# Patient Record
Sex: Male | Born: 1945 | Race: White | Hispanic: No | Marital: Married | Smoking: Former smoker
Health system: Southern US, Community
[De-identification: ages and names within clinical notes are randomized; demographics above are authoritative.]

## PROBLEM LIST (undated history)

## (undated) DIAGNOSIS — H269 Unspecified cataract: Secondary | ICD-10-CM

## (undated) DIAGNOSIS — I10 Essential (primary) hypertension: Secondary | ICD-10-CM

## (undated) DIAGNOSIS — I4891 Unspecified atrial fibrillation: Secondary | ICD-10-CM

## (undated) DIAGNOSIS — Z720 Tobacco use: Secondary | ICD-10-CM

## (undated) DIAGNOSIS — I219 Acute myocardial infarction, unspecified: Secondary | ICD-10-CM

## (undated) DIAGNOSIS — T7840XA Allergy, unspecified, initial encounter: Secondary | ICD-10-CM

## (undated) HISTORY — DX: Essential (primary) hypertension: I10

## (undated) HISTORY — DX: Allergy, unspecified, initial encounter: T78.40XA

## (undated) HISTORY — DX: Unspecified cataract: H26.9

## (undated) HISTORY — DX: Tobacco use: Z72.0

## (undated) HISTORY — PX: HERNIA REPAIR: SHX51

## (undated) HISTORY — PX: OTHER SURGICAL HISTORY: SHX169

---
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
## 1997-10-18 ENCOUNTER — Ambulatory Visit (HOSPITAL_BASED_OUTPATIENT_CLINIC_OR_DEPARTMENT_OTHER): Admission: RE | Admit: 1997-10-18 | Discharge: 1997-10-18 | Payer: Self-pay | Admitting: Orthopaedic Surgery

## 2004-09-13 ENCOUNTER — Ambulatory Visit: Payer: Self-pay | Admitting: Internal Medicine

## 2004-10-18 ENCOUNTER — Ambulatory Visit: Payer: Self-pay | Admitting: Internal Medicine

## 2004-10-24 ENCOUNTER — Ambulatory Visit: Payer: Self-pay | Admitting: Internal Medicine

## 2005-01-15 ENCOUNTER — Ambulatory Visit: Payer: Self-pay | Admitting: Internal Medicine

## 2005-01-17 ENCOUNTER — Ambulatory Visit: Payer: Self-pay

## 2005-02-18 ENCOUNTER — Ambulatory Visit: Payer: Self-pay | Admitting: Internal Medicine

## 2005-02-26 ENCOUNTER — Ambulatory Visit: Payer: Self-pay | Admitting: Internal Medicine

## 2005-03-06 ENCOUNTER — Ambulatory Visit: Payer: Self-pay | Admitting: Internal Medicine

## 2005-08-27 ENCOUNTER — Ambulatory Visit: Payer: Self-pay | Admitting: Internal Medicine

## 2005-09-03 ENCOUNTER — Ambulatory Visit: Payer: Self-pay | Admitting: Internal Medicine

## 2005-09-27 ENCOUNTER — Ambulatory Visit: Payer: Self-pay | Admitting: Internal Medicine

## 2005-10-28 ENCOUNTER — Ambulatory Visit: Payer: Self-pay | Admitting: Internal Medicine

## 2006-04-02 ENCOUNTER — Ambulatory Visit: Payer: Self-pay | Admitting: Internal Medicine

## 2007-02-03 DIAGNOSIS — I10 Essential (primary) hypertension: Secondary | ICD-10-CM | POA: Insufficient documentation

## 2007-02-03 DIAGNOSIS — IMO0002 Reserved for concepts with insufficient information to code with codable children: Secondary | ICD-10-CM | POA: Insufficient documentation

## 2007-02-03 DIAGNOSIS — E1165 Type 2 diabetes mellitus with hyperglycemia: Secondary | ICD-10-CM

## 2007-02-03 DIAGNOSIS — I152 Hypertension secondary to endocrine disorders: Secondary | ICD-10-CM | POA: Insufficient documentation

## 2007-02-03 DIAGNOSIS — E1159 Type 2 diabetes mellitus with other circulatory complications: Secondary | ICD-10-CM

## 2007-02-03 DIAGNOSIS — E1139 Type 2 diabetes mellitus with other diabetic ophthalmic complication: Secondary | ICD-10-CM

## 2007-09-22 LAB — HM COLONOSCOPY

## 2008-07-11 ENCOUNTER — Ambulatory Visit: Payer: Self-pay | Admitting: Internal Medicine

## 2008-07-11 LAB — CONVERTED CEMR LAB
ALT: 13 units/L (ref 0–53)
AST: 13 units/L (ref 0–37)
Albumin: 4.2 g/dL (ref 3.5–5.2)
Alkaline Phosphatase: 59 units/L (ref 39–117)
BUN: 13 mg/dL (ref 6–23)
Basophils Absolute: 0 10*3/uL (ref 0.0–0.1)
Basophils Relative: 0.2 % (ref 0.0–3.0)
Bilirubin Urine: NEGATIVE
Bilirubin, Direct: 0.2 mg/dL (ref 0.0–0.3)
CO2: 29 meq/L (ref 19–32)
Calcium: 9 mg/dL (ref 8.4–10.5)
Chloride: 103 meq/L (ref 96–112)
Cholesterol: 215 mg/dL (ref 0–200)
Creatinine, Ser: 0.9 mg/dL (ref 0.4–1.5)
Direct LDL: 159.5 mg/dL
Eosinophils Absolute: 0.1 10*3/uL (ref 0.0–0.7)
Eosinophils Relative: 2.3 % (ref 0.0–5.0)
GFR calc Af Amer: 110 mL/min
GFR calc non Af Amer: 91 mL/min
Glucose, Bld: 325 mg/dL — ABNORMAL HIGH (ref 70–99)
HCT: 45.1 % (ref 39.0–52.0)
HDL: 32.6 mg/dL — ABNORMAL LOW (ref >39.0)
Hemoglobin, Urine: NEGATIVE
Hemoglobin: 15.3 g/dL (ref 13.0–17.0)
Ketones, ur: 15 mg/dL — AB
Leukocytes, UA: NEGATIVE
Lymphocytes Relative: 35.4 % (ref 12.0–46.0)
MCHC: 34 g/dL (ref 30.0–36.0)
MCV: 90.9 fL (ref 78.0–100.0)
Monocytes Absolute: 0.5 10*3/uL (ref 0.1–1.0)
Monocytes Relative: 8.6 % (ref 3.0–12.0)
Neutro Abs: 2.8 10*3/uL (ref 1.4–7.7)
Neutrophils Relative %: 53.5 % (ref 43.0–77.0)
Nitrite: NEGATIVE
PSA: 0.29 ng/mL (ref 0.10–4.00)
Platelets: 243 10*3/uL (ref 150–400)
Potassium: 4.2 meq/L (ref 3.5–5.1)
RBC: 4.96 M/uL (ref 4.22–5.81)
RDW: 12.8 % (ref 11.5–14.6)
Sodium: 137 meq/L (ref 135–145)
Specific Gravity, Urine: 1.025 (ref 1.000–1.035)
TSH: 2.16 microintl units/mL (ref 0.35–5.50)
Total Bilirubin: 1.3 mg/dL — ABNORMAL HIGH (ref 0.3–1.2)
Total CHOL/HDL Ratio: 6.6
Total Protein, Urine: NEGATIVE mg/dL
Total Protein: 6.8 g/dL (ref 6.0–8.3)
Triglycerides: 100 mg/dL (ref 0–149)
Urine Glucose: >=1000 mg/dL — CR
Urobilinogen, UA: 0.2 (ref 0.0–1.0)
VLDL: 20 mg/dL (ref 0–40)
WBC: 5.3 10*3/uL (ref 4.5–10.5)
pH: 5 (ref 5.0–8.0)

## 2008-07-13 ENCOUNTER — Ambulatory Visit: Payer: Self-pay | Admitting: Internal Medicine

## 2008-07-18 ENCOUNTER — Ambulatory Visit: Payer: Self-pay | Admitting: Internal Medicine

## 2008-08-04 ENCOUNTER — Ambulatory Visit: Payer: Self-pay | Admitting: Internal Medicine

## 2008-08-30 ENCOUNTER — Ambulatory Visit: Payer: Self-pay | Admitting: Internal Medicine

## 2008-08-30 DIAGNOSIS — IMO0002 Reserved for concepts with insufficient information to code with codable children: Secondary | ICD-10-CM | POA: Insufficient documentation

## 2008-09-02 ENCOUNTER — Telehealth: Payer: Self-pay | Admitting: Internal Medicine

## 2008-09-07 ENCOUNTER — Ambulatory Visit: Payer: Self-pay | Admitting: Internal Medicine

## 2008-09-07 DIAGNOSIS — M24159 Other articular cartilage disorders, unspecified hip: Secondary | ICD-10-CM | POA: Insufficient documentation

## 2008-09-21 ENCOUNTER — Encounter: Payer: Self-pay | Admitting: Internal Medicine

## 2008-10-05 ENCOUNTER — Ambulatory Visit: Payer: Self-pay | Admitting: Internal Medicine

## 2008-11-09 ENCOUNTER — Ambulatory Visit: Payer: Self-pay | Admitting: Vascular Surgery

## 2008-12-02 ENCOUNTER — Ambulatory Visit: Payer: Self-pay | Admitting: Internal Medicine

## 2008-12-02 LAB — CONVERTED CEMR LAB: Hgb A1c MFr Bld: 7.2 % — ABNORMAL HIGH (ref 4.6–6.5)

## 2008-12-09 ENCOUNTER — Ambulatory Visit: Payer: Self-pay | Admitting: Internal Medicine

## 2009-01-16 ENCOUNTER — Ambulatory Visit: Payer: Self-pay | Admitting: Internal Medicine

## 2009-04-03 ENCOUNTER — Ambulatory Visit: Payer: Self-pay | Admitting: Internal Medicine

## 2009-04-03 LAB — CONVERTED CEMR LAB
Alkaline Phosphatase: 41 units/L (ref 39–117)
Basophils Absolute: 0 10*3/uL (ref 0.0–0.1)
Basophils Relative: 0.3 % (ref 0.0–3.0)
Bilirubin, Direct: 0.1 mg/dL (ref 0.0–0.3)
Creatinine,U: 136.6 mg/dL
Eosinophils Absolute: 0.2 10*3/uL (ref 0.0–0.7)
HCT: 42.9 % (ref 39.0–52.0)
HDL: 42.3 mg/dL (ref >39.00)
Hemoglobin: 14.6 g/dL (ref 13.0–17.0)
Lymphocytes Relative: 31.1 % (ref 12.0–46.0)
Lymphs Abs: 2.1 10*3/uL (ref 0.7–4.0)
MCHC: 34.1 g/dL (ref 30.0–36.0)
Microalb Creat Ratio: 9.5 mg/g (ref 0.0–30.0)
Monocytes Relative: 7.1 % (ref 3.0–12.0)
Neutro Abs: 4.1 10*3/uL (ref 1.4–7.7)
RBC: 4.62 M/uL (ref 4.22–5.81)
RDW: 12.7 % (ref 11.5–14.6)
Total CHOL/HDL Ratio: 6
Total Protein: 7.6 g/dL (ref 6.0–8.3)
VLDL: 19.6 mg/dL (ref 0.0–40.0)

## 2009-04-11 ENCOUNTER — Ambulatory Visit: Payer: Self-pay | Admitting: Internal Medicine

## 2009-04-11 DIAGNOSIS — E1142 Type 2 diabetes mellitus with diabetic polyneuropathy: Secondary | ICD-10-CM | POA: Insufficient documentation

## 2009-06-20 ENCOUNTER — Ambulatory Visit: Payer: Self-pay | Admitting: Internal Medicine

## 2009-06-20 LAB — CONVERTED CEMR LAB
ALT: 28 units/L (ref 0–53)
AST: 23 units/L (ref 0–37)
Alkaline Phosphatase: 40 units/L (ref 39–117)
BUN: 8 mg/dL (ref 6–23)
Chloride: 106 meq/L (ref 96–112)
GFR calc non Af Amer: 103.16 mL/min (ref >60)
Potassium: 5.3 meq/L — ABNORMAL HIGH (ref 3.5–5.1)
Sodium: 141 meq/L (ref 135–145)
Total Bilirubin: 0.9 mg/dL (ref 0.3–1.2)

## 2009-07-04 ENCOUNTER — Ambulatory Visit: Payer: Self-pay | Admitting: Internal Medicine

## 2009-08-15 ENCOUNTER — Telehealth: Payer: Self-pay | Admitting: Internal Medicine

## 2009-09-06 ENCOUNTER — Encounter: Admission: RE | Admit: 2009-09-06 | Discharge: 2009-09-06 | Payer: Self-pay | Admitting: Orthopedic Surgery

## 2009-10-04 ENCOUNTER — Ambulatory Visit (HOSPITAL_BASED_OUTPATIENT_CLINIC_OR_DEPARTMENT_OTHER): Admission: RE | Admit: 2009-10-04 | Discharge: 2009-10-05 | Payer: Self-pay | Admitting: Orthopedic Surgery

## 2009-10-04 ENCOUNTER — Ambulatory Visit: Payer: Self-pay | Admitting: Internal Medicine

## 2009-10-04 LAB — CONVERTED CEMR LAB
ALT: 21 U/L
AST: 18 U/L
Albumin: 4.3 g/dL
Alkaline Phosphatase: 53 U/L
Bilirubin, Direct: 0.1 mg/dL
Cholesterol: 224 mg/dL — ABNORMAL HIGH
Creatinine,U: 268.6 mg/dL
Direct LDL: 168.3 mg/dL
HDL: 38.8 mg/dL — ABNORMAL LOW
Hgb A1c MFr Bld: 10.1 % — ABNORMAL HIGH
Microalb Creat Ratio: 2 mg/g
Microalb, Ur: 5.5 mg/dL — ABNORMAL HIGH
Total Bilirubin: 1 mg/dL
Total CHOL/HDL Ratio: 6
Total Protein: 6.7 g/dL
Triglycerides: 120 mg/dL
VLDL: 24 mg/dL

## 2009-10-11 ENCOUNTER — Ambulatory Visit: Payer: Self-pay | Admitting: Internal Medicine

## 2009-10-11 DIAGNOSIS — M25519 Pain in unspecified shoulder: Secondary | ICD-10-CM | POA: Insufficient documentation

## 2009-11-16 ENCOUNTER — Telehealth: Payer: Self-pay | Admitting: Internal Medicine

## 2009-11-17 ENCOUNTER — Telehealth: Payer: Self-pay | Admitting: *Deleted

## 2009-11-29 ENCOUNTER — Ambulatory Visit: Payer: Self-pay | Admitting: Internal Medicine

## 2009-11-29 LAB — CONVERTED CEMR LAB
Calcium: 8.8 mg/dL (ref 8.4–10.5)
GFR calc non Af Amer: 116.33 mL/min (ref >60)
Glucose, Bld: 229 mg/dL — ABNORMAL HIGH (ref 70–99)
Hgb A1c MFr Bld: 11.7 % — ABNORMAL HIGH (ref 4.6–6.5)
Potassium: 4.8 meq/L (ref 3.5–5.1)
Sodium: 137 meq/L (ref 135–145)

## 2009-12-14 ENCOUNTER — Ambulatory Visit: Payer: Self-pay | Admitting: Internal Medicine

## 2009-12-14 DIAGNOSIS — E1169 Type 2 diabetes mellitus with other specified complication: Secondary | ICD-10-CM

## 2009-12-14 DIAGNOSIS — E1165 Type 2 diabetes mellitus with hyperglycemia: Secondary | ICD-10-CM

## 2009-12-14 DIAGNOSIS — E118 Type 2 diabetes mellitus with unspecified complications: Secondary | ICD-10-CM | POA: Insufficient documentation

## 2010-02-08 ENCOUNTER — Ambulatory Visit: Payer: Self-pay | Admitting: Internal Medicine

## 2010-02-08 LAB — CONVERTED CEMR LAB
BUN: 13 mg/dL (ref 6–23)
GFR calc non Af Amer: 91.04 mL/min (ref >60)
Hgb A1c MFr Bld: 9.3 % — ABNORMAL HIGH (ref 4.6–6.5)
Potassium: 5.1 meq/L (ref 3.5–5.1)

## 2010-05-18 ENCOUNTER — Ambulatory Visit
Admission: RE | Admit: 2010-05-18 | Discharge: 2010-05-18 | Payer: Self-pay | Source: Home / Self Care | Attending: Internal Medicine | Admitting: Internal Medicine

## 2010-05-18 ENCOUNTER — Other Ambulatory Visit: Payer: Self-pay | Admitting: Internal Medicine

## 2010-05-18 LAB — B12 AND FOLATE PANEL
Folate: 11.5 ng/mL (ref >5.9)
Vitamin B-12: 253 pg/mL (ref 211–911)

## 2010-05-18 LAB — BASIC METABOLIC PANEL
BUN: 14 mg/dL (ref 6–23)
CO2: 29 mEq/L (ref 19–32)
Calcium: 9.3 mg/dL (ref 8.4–10.5)
Chloride: 100 mEq/L (ref 96–112)
Creatinine, Ser: 0.8 mg/dL (ref 0.4–1.5)
GFR: 102.87 mL/min (ref >60.00)
Glucose, Bld: 218 mg/dL — ABNORMAL HIGH (ref 70–99)
Potassium: 4.9 mEq/L (ref 3.5–5.1)
Sodium: 138 mEq/L (ref 135–145)

## 2010-05-18 LAB — HM DIABETES FOOT EXAM

## 2010-05-18 LAB — HEMOGLOBIN A1C: Hgb A1c MFr Bld: 9.1 % — ABNORMAL HIGH (ref 4.6–6.5)

## 2010-05-31 NOTE — Assessment & Plan Note (Signed)
Summary: 3 MNTH ROV//SLM   Vital Signs:  Patient profile:   65 year old male Height:      73 inches Weight:      262 pounds BMI:     34.69 Temp:     98.2 degrees F oral Pulse rate:   88 / minute Resp:     14 per minute BP sitting:   148 / 84  (left arm)  Vitals Entered By: Willy Eddy, LPN (May 18, 2010 8:07 AM) CC: roa- fasting blood sugars running around 150 fasting in am, Hypertension Management Is Patient Diabetic? Yes Did you bring your meter with you today? No   Primary Care Provider:  Stacie Glaze MD  CC:  roa- fasting blood sugars running around 150 fasting in am and Hypertension Management.  History of Present Illness: follow up for  DM and high A1C Follow-Up Visit      This is a 65 year old man who presents for Follow-up visit.  The patient denies chest pain, palpitations, dizziness, syncope, low blood sugar symptoms, high blood sugar symptoms, edema, SOB, DOE, PND, and orthopnea.  Since the last visit the patient notes no new problems or concerns.  The patient reports taking meds as prescribed.  When questioned about possible medication side effects, the patient notes none.    Hypertension History:      He denies headache, chest pain, palpitations, dyspnea with exertion, orthopnea, PND, peripheral edema, visual symptoms, neurologic problems, syncope, and side effects from treatment.        Positive major cardiovascular risk factors include male age 66 years old or older or older, diabetes, and hypertension.  Negative major cardiovascular risk factors include non-tobacco-user status.     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 One By Mouth Two Times A Day 2)  Lantus Solostar 100 Unit/ml Soln (Insulin Glargine) .... 40 U Subcutaneously Q Hs 3)  Accu-Chek Compact Test Drum  Strp (Glucose Blood) .... Use A Directed 4)  Bd Pen Needle Mini U/f 31g X 5 Mm Misc (Insulin Pen Needle) .... Use With Lantus Pen As Directed 5)  Folbee Plus  Tabs (B Complex-C-Folic Acid) .... One By Mouth Bid 6)  Oxycontin 10 Mg Xr12h-Tab (Oxycodone Hcl) .... As Needed 7)  Amitriptyline Hcl 10 Mg Tabs (Amitriptyline Hcl) .Marland Kitchen.. 1 At Bedtime  Current Medications (verified): 1)  Janumet 50-1000 Mg Tabs (Sitagliptin-Metformin Hcl) .Marland KitchenMarland KitchenMarland Kitchen  One By Mouth Two Times A Day 2)  Lantus Solostar 100 Unit/ml Soln (Insulin Glargine) .... 40 U Subcutaneously Q Hs 3)  Accu-Chek Compact Test Drum  Strp (Glucose Blood) .... Use A Directed 4)  Bd Pen Needle Mini U/f 31g X 5 Mm Misc (Insulin Pen Needle) .... Use With Lantus Pen As Directed 5)  Folbee Plus  Tabs (B Complex-C-Folic Acid) .... One By Mouth Bid 6)  Oxycontin 10 Mg Xr12h-Tab (Oxycodone Hcl) .... As Needed 7)  Amitriptyline Hcl 10 Mg Tabs (Amitriptyline Hcl) .Marland Kitchen.. 1 At Bedtime  Allergies (verified): No Known Drug Allergies  Past History:  Family  History: Last updated: 12/14/2009 Family History Hypertension  Social History: Last updated: 02/03/2007 Occupation: Married Current Smoker Alcohol use-yes  Risk Factors: Exercise: yes (07/04/2009)  Risk Factors: Smoking Status: quit (05/18/2010) Packs/Day: 1.0 (05/18/2010)  Past medical, surgical, family and social histories (including risk factors) reviewed, and no changes noted (except as noted below).  Past Medical History: Reviewed history from 02/03/2007 and no changes required. Diabetes mellitus, type II Hypertension  Past Surgical History: Reviewed history from 02/03/2007 and no changes required. Inguinal herniorrhaphy  Family History: Reviewed history from 12/14/2009 and no changes required. Family History Hypertension  Social History: Reviewed history from 02/03/2007 and no changes required. Occupation: Married Current Smoker Alcohol use-yes  Review of Systems  The patient denies anorexia, fever, weight loss, weight gain, vision loss, decreased hearing, hoarseness, chest pain, syncope, dyspnea on exertion, peripheral edema, prolonged cough, headaches, hemoptysis, abdominal pain, melena, hematochezia, severe indigestion/heartburn, hematuria, incontinence, genital sores, muscle weakness, suspicious skin lesions, transient blindness, difficulty walking, depression, unusual weight change, abnormal bleeding, enlarged lymph nodes, angioedema, and breast masses.    Physical Exam  General:  alert and well-developed.   Head:  normocephalic and male-pattern balding.   Eyes:  pupils equal and pupils round.   Ears:  R ear normal and L ear normal.   Nose:  no external deformity and no nasal discharge.   Mouth:  good dentition and pharynx pink and moist.   Neck:  No deformities, masses, or tenderness noted. Lungs:  normal respiratory effort and no wheezes.   Heart:  normal rate and regular rhythm.   Abdomen:  soft, non-tender, and normal bowel sounds.   Msk:  lumbar  lordosis and trigger point tenderness.  tender in the right hip greater trochater bursa  Pulses:  R and L carotid,radial,femoral,dorsalis pedis and posterior tibial pulses are full and equal bilaterally Extremities:  No clubbing, cyanosis, edema, or deformity noted with normal full range of motion of all joints.   Skin:  Intact without suspicious lesions or rashes Cervical Nodes:  No lymphadenopathy noted Axillary Nodes:  No palpable lymphadenopathy Inguinal Nodes:  No significant adenopathy  Diabetes Management Exam:    Foot Exam (with socks and/or shoes not present):       Sensory-Pinprick/Light touch:          Left medial foot (L-4): diminished          Left dorsal foot (L-5): diminished          Left lateral foot (S-1): diminished          Right medial foot (L-4): diminished          Right dorsal foot (L-5): diminished          Right lateral foot (S-1): diminished   Impression & Recommendations:  Problem # 1:  POLYNEUROPATHY IN DIABETES (ICD-357.2)  Orders: TLB-B12 + Folate Pnl ([id number])  Problem #  2:  HYPERTENSION (ICD-401.9)  BP today: 148/84 Prior BP: 136/84 (02/08/2010)  Prior 10 Yr Risk Heart Disease: Not enough information (07/13/2008)  Labs Reviewed: K+: 5.1 (02/08/2010) Creat: : 0.9 (02/08/2010)   Chol: 224 (10/04/2009)   HDL: 38.80 (10/04/2009)   LDL: DEL (07/11/2008)   TG: 120.0 (10/04/2009)  Problem # 3:  DIAB W/OTH MANIFESTS TYPE II/UNS TYPE UNCNTRL (ICD-250.82) monitering today for more improvement His updated medication list for this problem includes:    Janumet 50-1000 Mg Tabs (Sitagliptin-metformin hcl) ..... One by mouth two times a day    Lantus Solostar 100 Unit/ml Soln (Insulin glargine) .Marland KitchenMarland KitchenMarland KitchenMarland Kitchen 40 u subcutaneously q hs  Labs Reviewed: Creat: 0.9 (02/08/2010)     Last Eye Exam: normal (02/29/2008) Reviewed HgBA1c results: 9.3 (02/08/2010)  11.7 (11/29/2009)  Problem # 4:  POLYNEUROPATHY IN DIABETES (ICD-357.2) Assessment:  Unchanged  Orders: TLB-B12 + Folate Pnl ([id number])  Complete Medication List: 1)  Janumet 50-1000 Mg Tabs (Sitagliptin-metformin hcl) .... One by mouth two times a day 2)  Lantus Solostar 100 Unit/ml Soln (Insulin glargine) .... 40 u subcutaneously q hs 3)  Accu-chek Compact Test Drum Strp (Glucose blood) .... Use a directed 4)  Bd Pen Needle Mini U/f 31g X 5 Mm Misc (Insulin pen needle) .... Use with lantus pen as directed 5)  Folbee Plus Tabs (B complex-c-folic acid) .... One by mouth bid 6)  Oxycontin 10 Mg Xr12h-tab (Oxycodone hcl) .... As needed 7)  Amitriptyline Hcl 25 Mg Tabs (Amitriptyline hcl) .... One by mouth a hs  Other Orders: Venipuncture (95621) TLB-BMP (Basic Metabolic Panel-BMET) (80048-METABOL) TLB-A1C / Hgb A1C (Glycohemoglobin) (83036-A1C)  Hypertension Assessment/Plan:      The patient's hypertensive risk group is category C: Target organ damage and/or diabetes.  Today's blood pressure is 148/84.  His blood pressure goal is < 130/80.  Patient Instructions: 1)  Please schedule a follow-up appointment in 3 months. Prescriptions: AMITRIPTYLINE HCL 25 MG TABS (AMITRIPTYLINE HCL) one by mouth a HS  #30 x 11   Entered and Authorized by:   Stacie Glaze MD   Signed by:   Stacie Glaze MD on 05/18/2010   Method used:   Electronically to        [facility] [address] [phone]* (retail)       [address]       [place], [place]  [address]       Ph: [phone] or [phone]       Fax: [phone]   RxID:   [id number]    Orders Added: 1)  Est. Patient Level IV [18841] 2)  Venipuncture [66063] 3)  TLB-BMP (Basic Metabolic Panel-BMET) [80048-METABOL] 4)  TLB-A1C / Hgb A1C (Glycohemoglobin) [83036-A1C] 5)  TLB-B12 + Folate Pnl [[id number]/FOL]  Appended Document: Orders Update    Clinical Lists Changes  Orders: Added new Service order of Specimen Handling ([id number]) - Signed

## 2010-05-31 NOTE — Assessment & Plan Note (Signed)
Summary: 2 month fup//ccm   Vital Signs:  Patient profile:   65 year old male Height:      73 inches Weight:      258 pounds BMI:     34.16 Temp:     98.3 degrees F oral Pulse rate:   80 / minute Resp:     14 per minute BP sitting:   140 / 90  (left arm)  Vitals Entered By: Willy Eddy, LPN (December 14, 2009 8:57 AM) CC: roa- labs-took kombiglyze for 30 days and didnt like- made blood sugars run around 250- went back on the janumet about 2-3 weeks ago and blood sugars have come back down to 150-160-, Hypertension Management Is Patient Diabetic? Yes Did you bring your meter with you today? No   Primary Care Provider:  Stacie Glaze MD  CC:  roa- labs-took kombiglyze for 30 days and didnt like- made blood sugars run around 250- went back on the janumet about 2-3 weeks ago and blood sugars have come back down to 150-160- and Hypertension Management.  History of Present Illness: started back on the janumet took the [place] for 45 days and the numbers are increased weight is up had surgery on the right shouder and had  inflamation the blood sugars seem to have dropped last wekkhe relates the increased blood sugar to the shoulder pain and inflamation meter reading are 160 last week  Hypertension History:      He denies headache, chest pain, palpitations, dyspnea with exertion, orthopnea, PND, peripheral edema, visual symptoms, neurologic problems, syncope, and side effects from treatment.        Positive major cardiovascular risk factors include male age 42 years old or older, diabetes, and hypertension.  Negative major cardiovascular risk factors include non-tobacco-user status.     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) 5)  Back Pain, Lumbar, With Radiculopathy  (ICD-724.4) 6)  Preventive Health Care  (ICD-V70.0) 7)  Hypertension  (ICD-401.9) 8)  Diabetes Mellitus, Type II  (ICD-250.00)  Current Problems (verified): 1)  Shoulder Pain, Right  (ICD-719.41) 2)  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 One By Mouth Daily 2)  Lantus Solostar 100 Unit/ml Soln (Insulin Glargine) .... 40 U Subcutaneously Q Hs 3)  Accu-Chek Compact Test Drum  Strp (Glucose Blood) .... Use A Directed 4)  Bd Pen Needle Mini U/f 31g X 5 Mm Misc (Insulin Pen Needle) .... Use With Lantus Pen As Directed 5)  Folbee Plus  Tabs (B Complex-C-Folic Acid) .... One By Mouth Bid 6)  Silenor 6 Mg Tabs (Doxepin Hcl) .... One By Mouth Q Hs 7)  Oxycontin 10 Mg Xr12h-Tab (Oxycodone Hcl) .Marland KitchenMarland KitchenMarland Kitchen  As Needed  Current Medications (verified): 1)  Janumet 50-1000 Mg Tabs (Sitagliptin-Metformin Hcl) .... One By Mouth Two Times A Day 2)  Lantus Solostar 100 Unit/ml Soln (Insulin Glargine) .... 40 U Subcutaneously Q Hs 3)  Accu-Chek Compact Test Drum  Strp (Glucose Blood) .... Use A Directed 4)  Bd Pen Needle Mini U/f 31g X 5 Mm Misc (Insulin Pen Needle) .... Use With Lantus Pen As Directed 5)  Folbee Plus  Tabs (B Complex-C-Folic Acid) .... One By Mouth Bid 6)  Silenor 6 Mg Tabs (Doxepin Hcl) .... One By Mouth Q Hs 7)  Oxycontin 10 Mg Xr12h-Tab (Oxycodone Hcl) .... As Needed  Allergies  (verified): No Known Drug Allergies  Past History:  Social History: Last updated: 02/03/2007 Occupation: Married Current Smoker Alcohol use-yes  Risk Factors: Exercise: yes (07/04/2009)  Risk Factors: Smoking Status: quit (12/14/2009) Packs/Day: 1.0 (12/14/2009)  Past medical, surgical, family and social histories (including risk factors) reviewed, and no changes noted (except as noted below).  Past Medical History: Reviewed history from 02/03/2007 and no changes required. Diabetes mellitus, type II Hypertension  Past Surgical History: Reviewed history from 02/03/2007 and no changes required. Inguinal herniorrhaphy  Family History: Reviewed history and no changes required. Family History Hypertension  Social History: Reviewed history from 02/03/2007 and no changes required. Occupation: Married Current Smoker Alcohol use-yes  Review of Systems  The patient denies anorexia, fever, weight loss, weight gain, vision loss, decreased hearing, hoarseness, chest pain, syncope, dyspnea on exertion, peripheral edema, prolonged cough, headaches, hemoptysis, abdominal pain, melena, hematochezia, severe indigestion/heartburn, hematuria, incontinence, genital sores, muscle weakness, suspicious skin lesions, transient blindness, difficulty walking, depression, unusual weight change, abnormal bleeding, enlarged lymph nodes, angioedema, and breast masses.    Physical Exam  General:  alert and well-developed.   Head:  normocephalic and male-pattern balding.   Eyes:  pupils equal and pupils round.   Ears:  R ear normal and L ear normal.   Nose:  no external deformity and no nasal discharge.   Mouth:  good dentition and pharynx pink and moist.   Neck:  No deformities, masses, or tenderness noted. Lungs:  normal respiratory effort and no wheezes.   Heart:  normal rate and regular rhythm.   Abdomen:  soft, non-tender, and normal bowel sounds.   Msk:  lumbar lordosis and trigger  point tenderness.  tender in the right hip greater trochater bursa  Pulses:  R and L carotid,radial,femoral,dorsalis pedis and posterior tibial pulses are full and equal bilaterally Extremities:  No clubbing, cyanosis, edema, or deformity noted with normal full range of motion of all joints.   Neurologic:  No cranial nerve deficits noted. Station and gait are normal. Plantar reflexes are down-going bilaterally. DTRs are symmetrical throughout. Sensory, motor and coordinative functions appear intact.   Impression & Recommendations:  Problem # 1:  DIAB W/OTH MANIFESTS TYPE II/UNS TYPE UNCNTRL (ICD-250.82)  the pt failed the kombiglize the pt resumed the janumet add a titration schedule to the lantus His updated medication list for this problem includes:    Janumet 50-1000 Mg Tabs (Sitagliptin-metformin hcl) ..... One by mouth two times a day    Lantus Solostar 100 Unit/ml Soln (Insulin glargine) .Marland KitchenMarland KitchenMarland KitchenMarland Kitchen 40 u subcutaneously q hs  Labs Reviewed: Creat: 0.7 (11/29/2009)     Last Eye Exam: normal (02/29/2008) Reviewed HgBA1c results: 11.7 (11/29/2009)  10.1 (10/04/2009)  Problem # 2:  HYPERTENSION (ICD-401.9)  BP today: 140/90 Prior BP: 140/80 (10/11/2009)  Prior 10 Yr Risk Heart Disease: Not  enough information (07/13/2008)  Labs Reviewed: K+: 4.8 (11/29/2009) Creat: : 0.7 (11/29/2009)   Chol: 224 (10/04/2009)   HDL: 38.80 (10/04/2009)   LDL: DEL (07/11/2008)   TG: 120.0 (10/04/2009)  Problem # 3:  DIABETES MELLITUS, TYPE II (ICD-250.00)  His updated medication list for this problem includes:    Janumet 50-1000 Mg Tabs (Sitagliptin-metformin hcl) ..... One by mouth two times a day    Lantus Solostar 100 Unit/ml Soln (Insulin glargine) .Marland KitchenMarland KitchenMarland KitchenMarland Kitchen 40 u subcutaneously q hs  Labs Reviewed: Creat: 0.7 (11/29/2009)     Last Eye Exam: normal (02/29/2008) Reviewed HgBA1c results: 11.7 (11/29/2009)  10.1 (10/04/2009)  Complete Medication List: 1)  Janumet 50-1000 Mg Tabs  (Sitagliptin-metformin hcl) .... One by mouth two times a day 2)  Lantus Solostar 100 Unit/ml Soln (Insulin glargine) .... 40 u subcutaneously q hs 3)  Accu-chek Compact Test Drum Strp (Glucose blood) .... Use a directed 4)  Bd Pen Needle Mini U/f 31g X 5 Mm Misc (Insulin pen needle) .... Use with lantus pen as directed 5)  Folbee Plus Tabs (B complex-c-folic acid) .... One by mouth bid 6)  Silenor 6 Mg Tabs (Doxepin hcl) .... One by mouth q hs 7)  Oxycontin 10 Mg Xr12h-tab (Oxycodone hcl) .... As needed 8)  Amitriptyline Hcl 25 Mg Tabs (Amitriptyline hcl)  Hypertension Assessment/Plan:      The patient's hypertensive risk group is category C: Target organ damage and/or diabetes.  Today's blood pressure is 140/90.  His blood pressure goal is < 130/80.  Patient Instructions: 1)  this week record glucoses only 2)  starting on 8/28 if the morning glucoses are above 200 add 5 units of lantus 3)  ( 45 total) 4)  if the glucose are below 200 wait another week 5)  and on sept 3  reassess...  if the glucose stay below 200 but do not drop to below 160  add 2 units of lantus ( repeat the patern until glcouse are below 160 on a regular basis in the am 6)  Please schedule a follow-up appointment in 2 months.  Appended Document: Orders Update    Clinical Lists Changes  Orders: Added new Service order of Specimen Handling ([id number]) - Signed

## 2010-05-31 NOTE — Progress Notes (Signed)
Summary: glucose levels  Phone Note Call from Patient Call back at Work Phone [phone]   Caller: Patient Call For: Stacie Glaze MD Reason for Call: Talk to Doctor, Lab or Test Results Complaint: Breathing Problems, Urinary/GYN Problems Summary of Call: patient is calling to inform Dr Lovell Sheehan that the sample of Kombiglyze is not helping to lower his glucose levels.  patient would like to know if there is something else you would like for him to try until his appointment Aug 18? Initial call taken by: Kern Reap CMA Duncan Dull),  November 16, 2009 4:05 PM  Follow-up for Phone Call        increase lantus to 40 units-per dr Lovell Sheehan and pt informed Follow-up by: Willy Eddy, LPN,  November 17, 2009 9:52 AM    New/Updated Medications: LANTUS SOLOSTAR 100 UNIT/ML SOLN (INSULIN GLARGINE) 40 u subcutaneously q hs

## 2010-05-31 NOTE — Assessment & Plan Note (Signed)
Summary: 2 MONTH ROV/NJR   Vital Signs:  Patient profile:   65 year old male Height:      73 inches Weight:      260 pounds BMI:     34.43 Temp:     98.2 degrees F oral Pulse rate:   80 / minute Resp:     14 per minute BP sitting:   136 / 84  (left arm) Cuff size:   large  Vitals Entered By: Willy Eddy, LPN (February 08, 2010 10:16 AM) CC: roa, Hypertension Management Is Patient Diabetic? Yes Did you bring your meter with you today? No   Primary Care Aneri Slagel:  Stacie Glaze MD  CC:  roa and Hypertension Management.  History of Present Illness: The pt  presents for follow up of lipids and diabetes last visit the a1v was out of control and the pt had been non adherance follow up after addition of the lantus to control CBGs with last a1c f 11  Hypertension History:      He denies headache, chest pain, palpitations, dyspnea with exertion, orthopnea, PND, peripheral edema, visual symptoms, neurologic problems, syncope, and side effects from treatment.        Positive major cardiovascular risk factors include male age 80 years old or older, diabetes, and hypertension.  Negative major cardiovascular risk factors include non-tobacco-user status.     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 One By Mouth Two Times A Day 2)  Lantus Solostar 100 Unit/ml Soln (Insulin Glargine) .... 40 U Subcutaneously Q Hs 3)  Accu-Chek Compact Test Drum  Strp (Glucose Blood) .... Use A Directed 4)  Bd Pen Needle Mini U/f 31g X 5 Mm Misc (Insulin Pen Needle) .... Use With Lantus Pen As Directed 5)  Folbee Plus  Tabs (B Complex-C-Folic Acid) .... One By Mouth Bid 6)  Silenor 6 Mg Tabs (Doxepin Hcl) .... One By Mouth Q Hs 7)  Oxycontin 10 Mg Xr12h-Tab (Oxycodone Hcl) .... As Needed 8)  Amitriptyline Hcl 25 Mg Tabs (Amitriptyline Hcl)  Current Medications (verified): 1)  Janumet 50-1000 Mg Tabs (Sitagliptin-Metformin Hcl) .... One By Mouth Two Times A Day 2)  Lantus Solostar 100 Unit/ml Soln (Insulin Glargine) .... 40 U Subcutaneously Q Hs 3)  Accu-Chek Compact Test Drum  Strp (Glucose Blood) .... Use A Directed 4)  Bd Pen Needle Mini U/f 31g X 5 Mm  Misc (Insulin Pen Needle) .... Use With Lantus Pen As Directed 5)  Folbee Plus  Tabs (B Complex-C-Folic Acid) .... One By Mouth Bid 6)  Silenor 6 Mg Tabs (Doxepin Hcl) .... One By Mouth Q Hs 7)  Oxycontin 10 Mg Xr12h-Tab (Oxycodone Hcl) .... As Needed 8)  Amitriptyline Hcl 10 Mg Tabs (Amitriptyline Hcl) .Marland Kitchen.. 1 At Bedtime  Allergies (verified): No Known Drug Allergies  Past History:  Family History: Last updated: 12/14/2009 Family History Hypertension  Social History: Last updated: 02/03/2007 Occupation: Married Current Smoker Alcohol use-yes  Risk Factors: Exercise: yes (07/04/2009)  Risk Factors: Smoking Status: quit  (02/08/2010) Packs/Day: 1.0 (02/08/2010)  Past medical, surgical, family and social histories (including risk factors) reviewed, and no changes noted (except as noted below).  Past Medical History: Reviewed history from 02/03/2007 and no changes required. Diabetes mellitus, type II Hypertension  Past Surgical History: Reviewed history from 02/03/2007 and no changes required. Inguinal herniorrhaphy  Family History: Reviewed history from 12/14/2009 and no changes required. Family History Hypertension  Social History: Reviewed history from 02/03/2007 and no changes required. Occupation: Married Current Smoker Alcohol use-yes  Review of Systems       Flu Vaccine Consent Questions     Do you have a history of severe allergic reactions to this vaccine? no    Any prior history of allergic reactions to egg and/or gelatin? no    Do you have a sensitivity to the preservative Thimersol? no    Do you have a past history of Guillan-Barre Syndrome? no    Do you currently have an acute febrile illness? no    Have you ever had a severe reaction to latex? no    Vaccine information given and explained to patient? yes    Are you currently pregnant? no    Lot Number:[id number]   Exp Date:10/27/2010   Site Given  Left Deltoid IM    Physical Exam  General:  alert and well-developed.   Head:  normocephalic and male-pattern balding.   Eyes:  pupils equal and pupils round.   Ears:  R ear normal and L ear normal.   Nose:  no external deformity and no nasal discharge.   Mouth:  good dentition and pharynx pink and moist.   Neck:  No deformities, masses, or tenderness noted. Lungs:  normal respiratory effort and no wheezes.   Heart:  normal rate and regular rhythm.   Abdomen:  soft, non-tender, and normal bowel sounds.   Msk:  lumbar lordosis and trigger point tenderness.  tender in the right hip greater trochater bursa  Pulses:  R and L carotid,radial,femoral,dorsalis pedis and posterior  tibial pulses are full and equal bilaterally Extremities:  No clubbing, cyanosis, edema, or deformity noted with normal full range of motion of all joints.   Neurologic:  No cranial nerve deficits noted. Station and gait are normal. Plantar reflexes are down-going bilaterally. DTRs are symmetrical throughout. Sensory, motor and coordinative functions appear intact.  Diabetes Management Exam:    Foot Exam (with socks and/or shoes not present):       Sensory-Pinprick/Light touch:          Left medial foot (L-4): diminished          Left dorsal foot (L-5): diminished          Left lateral foot (S-1): diminished          Right medial foot (L-4): diminished          Right dorsal foot (L-5):  diminished          Right lateral foot (S-1): diminished   Impression & Recommendations:  Problem # 1:  DIABETES MELLITUS, TYPE II (ICD-250.00)  His updated medication list for this problem includes:    Janumet 50-1000 Mg Tabs (Sitagliptin-metformin hcl) ..... One by mouth two times a day    Lantus Solostar 100 Unit/ml Soln (Insulin glargine) .Marland KitchenMarland KitchenMarland KitchenMarland Kitchen 40 u subcutaneously q hs has increased to 40 units Labs Reviewed: Creat: 0.7 (11/29/2009)     Last Eye Exam: normal (02/29/2008) Reviewed HgBA1c results: 11.7 (11/29/2009)  10.1 (10/04/2009)  Orders: Venipuncture (95284) TLB-BMP (Basic Metabolic Panel-BMET) (80048-METABOL) TLB-A1C / Hgb A1C (Glycohemoglobin) (83036-A1C)  Complete Medication List: 1)  Janumet 50-1000 Mg Tabs (Sitagliptin-metformin hcl) .... One by mouth two times a day 2)  Lantus Solostar 100 Unit/ml Soln (Insulin glargine) .... 40 u subcutaneously q hs 3)  Accu-chek Compact Test Drum Strp (Glucose blood) .... Use a directed 4)  Bd Pen Needle Mini U/f 31g X 5 Mm Misc (Insulin pen needle) .... Use with lantus pen as directed 5)  Folbee Plus Tabs (B complex-c-folic acid) .... One by mouth bid 6)  Oxycontin 10 Mg Xr12h-tab (Oxycodone hcl) .... As needed 7)  Amitriptyline Hcl 10 Mg Tabs  (Amitriptyline hcl) .Marland Kitchen.. 1 at bedtime  Other Orders: Flu Vaccine 30yrs + MEDICARE PATIENTS (X3244) Administration Flu vaccine - MCR (W1027)  Hypertension Assessment/Plan:      The patient's hypertensive risk group is category C: Target organ damage and/or diabetes.  Today's blood pressure is 136/84.  His blood pressure goal is < 130/80.  Patient Instructions: 1)  Please schedule a follow-up appointment in 3 months. Prescriptions: AMITRIPTYLINE HCL 10 MG TABS (AMITRIPTYLINE HCL) 1 at bedtime  #30 x 11   Entered and Authorized by:   Stacie Glaze MD   Signed by:   Stacie Glaze MD on 02/08/2010   Method used:   Electronically to        [facility] [address] [phone]* (retail)       [address] [place], [place]  [address]       Ph: [phone]       Fax: [phone]   RxID:   [id number]   Appended Document: Orders Update    Clinical Lists Changes  Orders: Added new Service order of Zoster (Shingles) Vaccine Live [phone]) - Signed Added new Service order of Admin 1st Vaccine (32202) - Signed Observations: Added new observation of ZOSTAVAX LOT: [id number] (02/08/2010 11:10) Added new observation of ZOSTAVAX EXP: 12/15/2010 (02/08/2010 11:10) Added new observation of ZOSTAVAX RTE: IM (02/08/2010 11:10) Added new observation of ZOSTAVAXDOSE: 0.5 ml (02/08/2010 11:10) Added new observation of ZOSTAVAX MFR: Merck (02/08/2010 11:10) Added new observation of ZOSTAVAXSITE: right deltoid (02/08/2010 11:10) Added new observation of ZOSTAVAX: Zostavax (02/08/2010 11:10)       Immunizations Administered:  Zostavax # 1:    Vaccine Type: Zostavax    Site: right deltoid    Mfr: Merck    Dose: 0.5 ml    Route: IM    Exp. Date: 12/15/2010    Lot #: [id number]

## 2010-05-31 NOTE — Assessment & Plan Note (Signed)
Summary: 3 month fup//ccm   Vital Signs:  Patient profile:   65 year old male Height:      73 inches Weight:      256 pounds BMI:     33.90 Temp:     98.2 degrees F oral Pulse rate:   84 / minute Resp:     14 per minute BP sitting:   140 / 80  (left arm)  Vitals Entered By: Willy Eddy, LPN (October 11, 2009 9:21 AM) CC: roa labs, Hypertension Management, Type 2 diabetes mellitus follow-up   Primary Care Provider:  Stacie Glaze MD  CC:  roa labs, Hypertension Management, and Type 2 diabetes mellitus follow-up.  History of Present Illness: the pt had rotator cuff surgery and has locked shoulder ( [place]) HE HAS PT PLANNED FOR FROZEN SHOULDER THE PT IS OXYCONTIN FOR PAIN CONTROL HIS cbgS ARE VERY HIGH AND THIS INFLUENCED HEALING he did quit smoking!   Type 2 Diabetes Mellitus Follow-Up      This is a 65 year old man who presents for Type 2 diabetes mellitus follow-up.  cbgs in the 300 range.  The patient reports polyuria, polydipsia, and blurred vision, but denies self managed hypoglycemia, hypoglycemia requiring help, weight loss, weight gain, and numbness of extremities.  The patient denies the following symptoms: neuropathic pain, chest pain, vomiting, orthostatic symptoms, poor wound healing, intermittent claudication, vision loss, and foot ulcer.  Since the last visit the patient reports poor dietary compliance.  The patient has been measuring capillary blood glucose before breakfast.  Since the last visit, the patient reports having had eye care by an ophthalmologist.    Hypertension History:      He denies headache, chest pain, palpitations, dyspnea with exertion, orthopnea, PND, peripheral edema, visual symptoms, neurologic problems, syncope, and side effects from treatment.        Positive major cardiovascular risk factors include male age 65 years old or older, diabetes, and hypertension.  Negative major cardiovascular risk factors include non-tobacco-user status.      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 One By Mouth Two Times A Day 2)  Lantus Solostar 100 Unit/ml Soln (Insulin Glargine) .... 20 U Subcutaneously Q Hs 3)  Accu-Chek Compact Test Drum  Strp (Glucose Blood) .... Use A Directed 4)  Bd Pen Needle Mini U/f 31g X 5 Mm Misc (Insulin Pen Needle) .... Use With Lantus Pen As Directed 5)  Folbee Plus  Tabs (B Complex-C-Folic Acid) .... One By Mouth Bid 6)  Silenor 6  Mg Tabs (Doxepin Hcl) .... One By Mouth Q Hs  Current Medications (verified): 1)  Janumet 50-500 Mg Tabs (Sitagliptin-Metformin Hcl) .... One By Mouth Two Times A Day 2)  Lantus Solostar 100 Unit/ml Soln (Insulin Glargine) .... 20 U Subcutaneously Q Hs 3)  Accu-Chek Compact Test Drum  Strp (Glucose Blood) .... Use A Directed 4)  Bd Pen Needle Mini U/f 31g X 5 Mm Misc (Insulin Pen Needle) .... Use With Lantus Pen As Directed 5)  Folbee Plus  Tabs (B Complex-C-Folic Acid) .... One By Mouth Bid 6)  Silenor 6 Mg Tabs  (Doxepin Hcl) .... One By Mouth Q Hs 7)  Oxycontin 10 Mg Xr12h-Tab (Oxycodone Hcl) .... As Needed  Allergies (verified): No Known Drug Allergies  Past History:  Social History: Last updated: 02/03/2007 Occupation: Married Current Smoker Alcohol use-yes  Risk Factors: Exercise: yes (07/04/2009)  Risk Factors: Smoking Status: quit (10/11/2009) Packs/Day: 1.0 (10/11/2009)  Past medical, surgical, family and social histories (including risk factors) reviewed, and no changes noted (except as noted below).  Past Medical History: Reviewed history from 02/03/2007 and no changes required. Diabetes mellitus, type II Hypertension  Past Surgical History: Reviewed history from 02/03/2007 and no changes required. Inguinal herniorrhaphy  Family History: Reviewed history and no changes required.  Social History: Reviewed history from 02/03/2007 and no changes required. Occupation: Married Current Smoker Alcohol use-yes  Review of Systems  The patient denies anorexia, fever, weight loss, weight gain, vision loss, decreased hearing, hoarseness, chest pain, syncope, dyspnea on exertion, peripheral edema, prolonged cough, headaches, hemoptysis, abdominal pain, melena, hematochezia, severe indigestion/heartburn, hematuria, incontinence, genital sores, muscle weakness, suspicious skin lesions, transient blindness, difficulty walking, depression, unusual weight change, abnormal bleeding, enlarged lymph nodes, angioedema, breast masses, and testicular masses.    Physical Exam  General:  alert and well-developed.   Head:  normocephalic and male-pattern balding.   Eyes:  pupils equal and pupils round.   Ears:  R ear normal and L ear normal.   Nose:  no external deformity and no nasal discharge.   Mouth:  good dentition and pharynx pink and moist.   Neck:  No deformities, masses, or tenderness noted. Lungs:  normal respiratory effort and no wheezes.   Heart:  normal rate and regular  rhythm.   Abdomen:  soft, non-tender, and normal bowel sounds.     Impression & Recommendations:  Problem # 1:  DIAB W/NEURO MANIFESTS TYPE I [JUV] NOT UNCNTRL (ICD-250.61) chang ein both medicine with increased lantus His updated medication list for this problem includes:    Kombiglyze Xr 08-998 Mg Xr24h-tab (Saxagliptin-metformin) ..... One by mouth daily    Lantus Solostar 100 Unit/ml Soln (Insulin glargine) .Marland KitchenMarland KitchenMarland KitchenMarland Kitchen 30 u subcutaneously q hs  Labs Reviewed: Creat: 0.8 (06/20/2009)     Last Eye Exam: normal (02/29/2008) Reviewed HgBA1c results: 10.1 (10/04/2009)  7.9 (06/20/2009)  Problem # 2:  HYPERTENSION (ICD-401.9) the blood presussre is stable BP today: 140/80 Prior BP: 142/80 (07/04/2009)  Prior 10 Yr Risk Heart Disease: Not enough information (07/13/2008)  Labs Reviewed: K+: 5.3 (06/20/2009) Creat: : 0.8 (06/20/2009)   Chol: 224 (10/04/2009)   HDL: 38.80 (10/04/2009)   LDL: DEL (07/11/2008)   TG: 120.0 (10/04/2009)  Problem # 3:  BACK PAIN, LUMBAR, WITH RADICULOPATHY (ICD-724.4)  His updated medication list for this problem includes:    Oxycontin 10 Mg Xr12h-tab (Oxycodone hcl) .Marland Kitchen... As needed  Discussed use of moist heat or ice, modified activities, medications, and stretching/strengthening exercises. Back care instructions given. To  be seen in 2 weeks if no improvement; sooner if worsening of symptoms.   Problem # 4:  SHOULDER PAIN, RIGHT (ICD-719.41)  His updated medication list for this problem includes:    Oxycontin 10 Mg Xr12h-tab (Oxycodone hcl) .Marland Kitchen... As needed  Discussed shoulder exercises, use of moist heat or ice, and medication.   Complete Medication List: 1)  Kombiglyze Xr 08-998 Mg Xr24h-tab (Saxagliptin-metformin) .... One by mouth daily 2)  Lantus Solostar 100 Unit/ml Soln (Insulin glargine) .... 30 u subcutaneously q hs 3)  Accu-chek Compact Test Drum Strp (Glucose blood) .... Use a directed 4)  Bd Pen Needle Mini U/f 31g X 5 Mm Misc (Insulin pen  needle) .... Use with lantus pen as directed 5)  Folbee Plus Tabs (B complex-c-folic acid) .... One by mouth bid 6)  Silenor 6 Mg Tabs (Doxepin hcl) .... One by mouth q hs 7)  Oxycontin 10 Mg Xr12h-tab (Oxycodone hcl) .... As needed  Hypertension Assessment/Plan:      The patient's hypertensive risk group is category C: Target organ damage and/or diabetes.  Today's blood pressure is 140/80.  His blood pressure goal is < 130/80.  Patient Instructions: 1)  20001U OF VIT D 3 2)  AND 400 OF VIT E 3)  Please schedule a follow-up appointment in 2 months. 4)  HbgA1C prior to visit, ICD-9:250.00 5)  BMP prior to visit, ICD-9:401.90 Prescriptions: KOMBIGLYZE XR 08-998 MG XR24H-TAB (SAXAGLIPTIN-METFORMIN) one by mouth daily  #30 x 11   Entered and Authorized by:   Stacie Glaze MD   Signed by:   Stacie Glaze MD on 10/11/2009   Method used:   Electronically to        [facility] [address] [phone]* (retail)       [address] [place], [place]  [address]       Ph: [phone]       Fax: [phone]   RxID:   [id number]

## 2010-05-31 NOTE — Assessment & Plan Note (Signed)
Summary: 2 month rov/njr   Vital Signs:  Patient profile:   65 year old male Height:      73 inches Weight:      262 pounds BMI:     34.69 Temp:     98.2 degrees F rectal Pulse rate:   76 / minute Resp:     14 per minute BP sitting:   142 / 80  (left arm)  Vitals Entered By: Willy Eddy, LPN (July 04, 1608 10:12 AM)  Nutrition Counseling: Patient's BMI is greater than 25 and therefore counseled on weight management options. CC: roa labs Is Patient Diabetic? Yes Did you bring your meter with you today? No   CC:  roa labs.  History of Present Illness: weight increase possible night time hypoglycemia the silenor causes am groggy sensation inactivity weight gain med adherance good foot neuropathy table   Diabetes Management History:      The patient is a 64 years old male who comes in for evaluation of DM Type 2.  He has not been enrolled in the "Diabetic Education Program".  He states understanding of dietary principles and is following his diet appropriately.  Sensory loss is noted.  Self foot exams are being performed.  He is not checking home blood sugars.  He says that he is exercising.  Type of exercise includes: walking.  Duration of exercise is estimated to be 30 min.  He is doing this 3 times per week.        Hypoglycemic symptoms are not occurring.  No hyperglycemic symptoms are reported.        No changes have been made to his treatment plan since last visit.     Preventive Screening-Counseling & Management  Alcohol-Tobacco     Smoking Status: quit     Packs/Day: 1.0     Year Started: 2005     Year Quit: quit september 2010  Caffeine-Diet-Exercise     Does Patient Exercise: yes     Type of exercise: walking     Exercise (avg: min/session): 30 min.     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 One By Mouth Two Times A Day 2)  Lantus Solostar 100 Unit/ml Soln (Insulin Glargine) .... 20 U Subcutaneously Q Hs 3)  Accu-Chek Compact Test Drum  Strp (Glucose Blood) .... Use A Directed 4)  Bd Pen Needle Mini U/f 31g X 5 Mm Misc (Insulin Pen Needle) .... Use With Lantus Pen As Directed 5)  Folbee Plus  Tabs (B Complex-C-Folic Acid) .... One By Mouth Bid 6)  Silenor 6 Mg Tabs (Doxepin Hcl) .... One By Mouth Q Hs  Allergies (verified): No Known Drug Allergies  Past History:  Social History: Last updated: 02/03/2007 Occupation: Married Current Smoker Alcohol use-yes  Risk Factors: Exercise: yes (07/04/2009)  Risk Factors: Smoking Status: quit (07/04/2009) Packs/Day: 1.0 (07/04/2009)  Past medical, surgical, family and social histories (including risk factors) reviewed, and no changes noted (except as noted below).  Past Medical History: Reviewed history from 02/03/2007 and no changes required. Diabetes mellitus, type II Hypertension  Past Surgical History: Reviewed history from 02/03/2007 and no changes required. Inguinal herniorrhaphy  Family History: Reviewed history and no changes required.  Social History: Reviewed history from 02/03/2007 and no changes required. Occupation: Married Current Smoker Alcohol use-yes Does Patient Exercise:  yes  Review of Systems  The patient denies anorexia, fever, weight loss, weight gain, vision loss, decreased hearing, hoarseness, chest pain, syncope, dyspnea on exertion, peripheral edema, prolonged cough, headaches, hemoptysis, abdominal pain, melena, hematochezia, severe indigestion/heartburn, hematuria, incontinence, genital sores,  muscle weakness, suspicious skin lesions, transient blindness, difficulty walking, depression, unusual weight change, abnormal bleeding, enlarged lymph nodes, angioedema, and breast masses.    Physical Exam  General:  alert and well-developed.   Head:  normocephalic and male-pattern balding.    Diabetes Management Exam:    Foot Exam (with socks and/or shoes not present):       Sensory-Pinprick/Light touch:          Left medial foot (L-4): diminished          Left dorsal foot (L-5): diminished          Left lateral foot (S-1): diminished          Right medial foot (L-4): diminished          Right dorsal foot (L-5): diminished          Right lateral foot (S-1): diminished    Eye Exam:       Eye Exam done elsewhere          Date: 02/29/2008          Results: normal          Done by: historical   Impression & Recommendations:  Problem # 1:  DIAB W/NEURO MANIFESTS TYPE I [JUV] NOT UNCNTRL (ICD-250.61)  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 One by mouth two times a day    Lantus Solostar 100 Unit/ml Soln (Insulin glargine) .Marland Kitchen... 20 u subcutaneously q hs turn back the tide with the weight gain after smoking cessation!  Labs Reviewed: Creat: 0.8 (06/20/2009)     Last Eye Exam: normal (02/29/2008) Reviewed HgBA1c results: 7.9 (06/20/2009)  7.3 (04/03/2009)  Problem # 2:  HYPERTENSION (ICD-401.9)  BP today: 142/80 Prior BP: 130/84 (04/11/2009)  Prior 10 Yr Risk Heart Disease: Not enough information (07/13/2008)  Labs Reviewed: K+: 5.3 (06/20/2009) Creat: : 0.8 (06/20/2009)   Chol: 235 (04/03/2009)   HDL: 42.30 (04/03/2009)   LDL: DEL (07/11/2008)   TG: 98.0 (04/03/2009)  Complete Medication List: 1)  Janumet 50-500 Mg Tabs (Sitagliptin-metformin hcl) .... One by mouth two times a day 2)  Lantus Solostar 100 Unit/ml Soln (Insulin glargine) .... 20 u  subcutaneously q hs 3)  Accu-chek Compact Test Drum Strp (Glucose blood) .... Use a directed 4)  Bd Pen Needle Mini U/f 31g X 5 Mm Misc (Insulin pen needle) .... Use with lantus pen as directed 5)  Folbee Plus Tabs (B complex-c-folic acid) .... One by mouth bid 6)  Silenor 6 Mg Tabs (Doxepin hcl) .... One by mouth q hs  Diabetes Management Assessment/Plan:      His blood pressure goal is < 130/80.    Patient Instructions: 1)  START NIGHT TIME/bed time snak 2)  two apple wedges and peanut butter 3)  two craker and peanut butter 4)  one inch cube cheeze and cracker 5)  ten almonds 6)  two cellery stalk and pimentocheeze  7)  weight went up.Marland KitchenMarland KitchenMarland KitchenMarland Kitchen weight loss 8)  Please schedule a follow-up appointment in 3 months. 9)  BMP prior to visit, ICD-9:401.9 10)  Hepatic Panel prior to visit, ICD-9:995.20 11)  Lipid Panel prior to visit, ICD-9:272.4 12)  HbgA1C prior to visit, ICD-9:250.00 13)  Urine Microalbumin prior to visit, ICD-9:

## 2010-05-31 NOTE — Progress Notes (Signed)
Summary: f/u on  blood sugars and meds  Phone Note Call from Patient   Caller: Patient Reason for Call: Talk to Nurse Summary of Call: f/u with Noora Locascio - FBS 280 - metformin not helping , some help from Lantus. can be reached at [phone] Initial call taken by: Duard Brady LPN,  November 17, 2009 9:25 AM  Follow-up for Phone Call        see other message Follow-up by: Willy Eddy, LPN,  November 17, 2009 9:52 AM

## 2010-05-31 NOTE — Progress Notes (Signed)
Summary: shoulder pain  Phone Note Call from Patient Call back at [phone]   Summary of Call: OV month ago.  Right shoulder pain.  To watch, Advil, hot & cold compresses.  Believe tendonitis or rotator cuff.  Carry heavy computer case that he jerked up one time.   When raise arm above shoulder height.  Limits use of arm & lot of pain.  Interferring with sleeping on it.  Washing face, raising arm very difficult.  Topical application sports creme.  Recommendation.  Goes to [employer] [facility], it's been awhile, Dr. Leslee Home 3-4 yrs ago or anyone.  Needs to get on with his yard work.   Initial call taken by: Rudy Jew, RN,  August 15, 2009 10:35 AM  Follow-up for Phone Call        per dr Hervey Ard- may see dr Leslee Home Follow-up by: Willy Eddy, LPN,  August 15, 2009 12:00 PM  Additional Follow-up for Phone Call Additional follow up Details #1::        Pt notified. Additional Follow-up by: Lynann Beaver CMA,  August 15, 2009 12:10 PM

## 2010-07-16 LAB — GLUCOSE, CAPILLARY
Glucose-Capillary: 168 mg/dL — ABNORMAL HIGH (ref 70–99)
Glucose-Capillary: 173 mg/dL — ABNORMAL HIGH (ref 70–99)

## 2010-07-16 LAB — POCT I-STAT 4, (NA,K, GLUC, HGB,HCT)
HCT: 29 % — ABNORMAL LOW (ref 39.0–52.0)
Hemoglobin: 9.9 g/dL — ABNORMAL LOW (ref 13.0–17.0)
Potassium: 6.6 mEq/L (ref 3.5–5.1)
Sodium: 138 mEq/L (ref 135–145)

## 2010-08-09 ENCOUNTER — Encounter: Payer: Self-pay | Admitting: Internal Medicine

## 2010-08-17 ENCOUNTER — Telehealth: Payer: Self-pay | Admitting: Internal Medicine

## 2010-08-17 ENCOUNTER — Ambulatory Visit (INDEPENDENT_AMBULATORY_CARE_PROVIDER_SITE_OTHER): Payer: Commercial Managed Care - PPO | Admitting: Internal Medicine

## 2010-08-17 ENCOUNTER — Telehealth: Payer: Self-pay | Admitting: *Deleted

## 2010-08-17 ENCOUNTER — Encounter: Payer: Self-pay | Admitting: Internal Medicine

## 2010-08-17 VITALS — BP 130/86 | HR 80 | Temp 98.2°F | Resp 16 | Ht 72.0 in | Wt 266.0 lb

## 2010-08-17 DIAGNOSIS — E119 Type 2 diabetes mellitus without complications: Secondary | ICD-10-CM

## 2010-08-17 DIAGNOSIS — E1165 Type 2 diabetes mellitus with hyperglycemia: Secondary | ICD-10-CM

## 2010-08-17 DIAGNOSIS — E1149 Type 2 diabetes mellitus with other diabetic neurological complication: Secondary | ICD-10-CM

## 2010-08-17 DIAGNOSIS — IMO0002 Reserved for concepts with insufficient information to code with codable children: Secondary | ICD-10-CM

## 2010-08-17 DIAGNOSIS — E1142 Type 2 diabetes mellitus with diabetic polyneuropathy: Secondary | ICD-10-CM

## 2010-08-17 DIAGNOSIS — E1169 Type 2 diabetes mellitus with other specified complication: Secondary | ICD-10-CM

## 2010-08-17 LAB — HEMOGLOBIN A1C: Hgb A1c MFr Bld: 9.9 % — ABNORMAL HIGH (ref 4.6–6.5)

## 2010-08-17 MED ORDER — AMITRIPTYLINE HCL 50 MG PO TABS: 50.0000 mg | ORAL_TABLET | Freq: Every day | ORAL | Status: DC

## 2010-08-17 NOTE — Assessment & Plan Note (Signed)
On elavil

## 2010-08-17 NOTE — Assessment & Plan Note (Signed)
weigth loss is the key to reaching goal

## 2010-08-17 NOTE — Progress Notes (Signed)
  Subjective:    Patient ID: Martin [name], male    DOB: Dec 09, 1945, 65 y.o.   MRN: [id number]  HPI   The patient is a 65 year old white male patient who has adult onset diabetes poorly controlled peripheral neuropathy and hypertension he presents today for followup of his A1c and control of his diabetes as well as monitoring his hypertension and discussion of his poor peripheral neuropathy.  He has no cardiovascular history of evidence that his high risk due to his age diabetes poorly controlled. He currently denies chest pain shortness of breath PND orthopnea he does report increasing neuropathy he has had an eye examination this year which he states showed no retinopathy  Review of Systems  Constitutional: Negative for fever and fatigue.  HENT: Negative for hearing loss, congestion, neck pain and postnasal drip.   Eyes: Negative for discharge, redness and visual disturbance.  Respiratory: Negative for cough, shortness of breath and wheezing.   Cardiovascular: Negative for leg swelling.  Gastrointestinal: Negative for abdominal pain, constipation and abdominal distention.  Genitourinary: Negative for urgency and frequency.  Musculoskeletal: Negative for joint swelling and arthralgias.  Skin: Negative for color change and rash.  Neurological: Positive for numbness. Negative for weakness and light-headedness.  Hematological: Negative for adenopathy.  Psychiatric/Behavioral: Negative for behavioral problems.       Past Medical History  Diagnosis Date  . Diabetes mellitus   . Hypertension    Past Surgical History  Procedure Date  . Hernia repair     reports that he quit smoking about 10 years ago. He does not have any smokeless tobacco history on file. He reports that he does not drink alcohol or use illicit drugs. family history includes Heart disease in his mother and Hypertension in his mother. No Known Allergies  Objective:   Physical Exam  Constitutional: He appears  well-developed and well-nourished.  HENT:  Head: Normocephalic and atraumatic.  Eyes: Conjunctivae are normal. Pupils are equal, round, and reactive to light.  Neck: Normal range of motion. Neck supple.  Cardiovascular: Normal rate and regular rhythm.   Pulmonary/Chest: Effort normal and breath sounds normal.  Abdominal: Soft. Bowel sounds are normal.  Neurological:       Stocking peripheral neuropathy          Assessment & Plan:  Review of further need for diabetic control we'll adjust medications as appropriate increase his Elavil from 25-50 for his neuropathy and urge diet and weight loss salt restriction to help control his blood pressure

## 2010-08-17 NOTE — Telephone Encounter (Signed)
Please disregard the previous note on labs-this was an error and placed on wrong chart

## 2010-08-17 NOTE — Patient Instructions (Signed)
The key to reaching your goals is a 10 pound weight loss

## 2010-08-17 NOTE — Assessment & Plan Note (Signed)
Weight gain is the complicating factor He does seem to be developing gastroparesis

## 2010-08-21 NOTE — Progress Notes (Signed)
The note on labs were charted in error --please disregard- pt was contacted and told of aic results as dr Lovell Sheehan ordered

## 2010-08-22 NOTE — Telephone Encounter (Signed)
done

## 2010-08-23 ENCOUNTER — Encounter: Payer: Self-pay | Admitting: Internal Medicine

## 2010-11-18 ENCOUNTER — Other Ambulatory Visit: Payer: Self-pay | Admitting: Internal Medicine

## 2010-12-18 ENCOUNTER — Ambulatory Visit: Payer: Commercial Managed Care - PPO | Admitting: Internal Medicine

## 2010-12-19 ENCOUNTER — Other Ambulatory Visit: Payer: Self-pay | Admitting: Internal Medicine

## 2011-01-14 ENCOUNTER — Ambulatory Visit: Payer: Commercial Managed Care - PPO | Admitting: Internal Medicine

## 2011-02-06 ENCOUNTER — Ambulatory Visit (INDEPENDENT_AMBULATORY_CARE_PROVIDER_SITE_OTHER): Payer: Commercial Managed Care - PPO | Admitting: Internal Medicine

## 2011-02-06 ENCOUNTER — Encounter: Payer: Self-pay | Admitting: Internal Medicine

## 2011-02-06 VITALS — BP 134/80 | HR 72 | Temp 98.2°F | Resp 16 | Ht 72.0 in | Wt 264.0 lb

## 2011-02-06 DIAGNOSIS — E1169 Type 2 diabetes mellitus with other specified complication: Secondary | ICD-10-CM

## 2011-02-06 DIAGNOSIS — E1165 Type 2 diabetes mellitus with hyperglycemia: Secondary | ICD-10-CM

## 2011-02-06 DIAGNOSIS — Z23 Encounter for immunization: Secondary | ICD-10-CM

## 2011-02-06 DIAGNOSIS — IMO0002 Reserved for concepts with insufficient information to code with codable children: Secondary | ICD-10-CM

## 2011-02-06 MED ORDER — INSULIN GLARGINE 100 UNIT/ML ~~LOC~~ SOLN: 42.0000 [IU] | Freq: Every day | SUBCUTANEOUS | Status: DC

## 2011-02-06 NOTE — Patient Instructions (Signed)
Testt your blood glucose twice a day and your blood glucoses over 200 but less than 250 use 5 units of regular insulin if your blood glucose is over 250 but less than 300 use 10 units of regular insulin

## 2011-02-06 NOTE — Progress Notes (Signed)
  Subjective:    Patient ID: Martin [name], male    DOB: 08-26-1945, 65 y.o.   MRN: [id number]  HPI Neuropathy is better CBG running 100-200 with wide variations   Review of Systems  Constitutional: Negative for fever and fatigue.  HENT: Negative for hearing loss, congestion, neck pain and postnasal drip.   Eyes: Negative for discharge, redness and visual disturbance.  Respiratory: Negative for cough, shortness of breath and wheezing.   Cardiovascular: Negative for leg swelling.  Gastrointestinal: Negative for abdominal pain, constipation and abdominal distention.  Genitourinary: Negative for urgency and frequency.  Musculoskeletal: Negative for joint swelling and arthralgias.  Skin: Negative for color change and rash.  Neurological: Negative for weakness and light-headedness.  Hematological: Negative for adenopathy.  Psychiatric/Behavioral: Negative for behavioral problems.   Past Medical History  Diagnosis Date  . Diabetes mellitus   . Hypertension    Past Surgical History  Procedure Date  . Hernia repair     reports that he quit smoking about 10 years ago. He does not have any smokeless tobacco history on file. He reports that he does not drink alcohol or use illicit drugs. family history includes Heart disease in his mother and Hypertension in his mother. No Known Allergies     Objective:   Physical Exam  Constitutional: He is oriented to person, place, and time.  Genitourinary: Rectum normal.  Musculoskeletal: Normal range of motion.  Neurological: He is alert and oriented to person, place, and time.       Decreased PP and LT  Skin: Skin is warm.          Assessment & Plan:  Patient is a poorly controlled diabetic due to dietary indiscretion and lack of control of weight.  He is moderately obese he has hypertension and his diabetes is complicated by polyneuropathy.  We'll obtain hemoglobin A1c we have discussed better dietary management and treatment of his  peripheral neuropathy with the addition of a B6 B12 vitamin supplementation.

## 2011-02-07 LAB — BASIC METABOLIC PANEL
BUN: 14 mg/dL (ref 6–23)
GFR: 79.34 mL/min (ref >60.00)
Potassium: 5 mEq/L (ref 3.5–5.1)
Sodium: 135 mEq/L (ref 135–145)

## 2011-02-27 ENCOUNTER — Other Ambulatory Visit: Payer: Self-pay | Admitting: Internal Medicine

## 2011-04-15 ENCOUNTER — Ambulatory Visit (INDEPENDENT_AMBULATORY_CARE_PROVIDER_SITE_OTHER): Payer: Commercial Managed Care - PPO | Admitting: Internal Medicine

## 2011-04-15 ENCOUNTER — Encounter: Payer: Self-pay | Admitting: Internal Medicine

## 2011-04-15 VITALS — BP 140/90 | HR 76 | Temp 98.2°F | Resp 16 | Ht 72.0 in | Wt 265.0 lb

## 2011-04-15 DIAGNOSIS — E1165 Type 2 diabetes mellitus with hyperglycemia: Secondary | ICD-10-CM

## 2011-04-15 DIAGNOSIS — IMO0002 Reserved for concepts with insufficient information to code with codable children: Secondary | ICD-10-CM

## 2011-04-15 DIAGNOSIS — T887XXA Unspecified adverse effect of drug or medicament, initial encounter: Secondary | ICD-10-CM

## 2011-04-15 MED ORDER — INSULIN LISPRO 100 UNIT/ML ~~LOC~~ SOLN: SUBCUTANEOUS | Status: DC

## 2011-04-15 MED ORDER — INSULIN GLARGINE 100 UNIT/ML ~~LOC~~ SOLN: 45.0000 [IU] | Freq: Every day | SUBCUTANEOUS | Status: DC

## 2011-04-15 NOTE — Patient Instructions (Signed)
Continue the sliding scale regular insulin and the lantus

## 2011-04-15 NOTE — Progress Notes (Signed)
Subjective:    Patient ID: Martin [name], male    DOB: [birth year], 65 y.o.   MRN: [id number]  HPI Patient was started on long-acting insulin due to poor control and sliding scale insulin added to the system and is variable control issues.  He states he's felt better since he started on sliding scale insulin and that his CBGs have markedly improved he has had one episode of low blood sugar but he was able to treat with food this occurred when he gave himself a short acting insulin shot and then went out in the yard 2 works.     Review of Systems  Constitutional: Negative for fever and fatigue.  HENT: Negative for hearing loss, congestion, neck pain and postnasal drip.   Eyes: Negative for discharge, redness and visual disturbance.  Respiratory: Negative for cough, shortness of breath and wheezing.   Cardiovascular: Negative for leg swelling.  Gastrointestinal: Negative for abdominal pain, constipation and abdominal distention.  Genitourinary: Negative for urgency and frequency.  Musculoskeletal: Negative for joint swelling and arthralgias.  Skin: Negative for color change and rash.  Neurological: Negative for weakness and light-headedness.  Hematological: Negative for adenopathy.  Psychiatric/Behavioral: Negative for behavioral problems.   Past Medical History  Diagnosis Date  . Diabetes mellitus   . Hypertension     History   Social History  . Marital Status: Married    Spouse Name: N/A    Number of Children: N/A  . Years of Education: N/A   Occupational History  . Not on file.   Social History Main Topics  . Smoking status: Former Smoker    Quit date: 04/29/2000  . Smokeless tobacco: Not on file   Comment: smoked for 10 years on and off  . Alcohol Use: No  . Drug Use: No  . Sexually Active: Yes   Other Topics Concern  . Not on file   Social History Narrative  . No narrative on file    Past Surgical History  Procedure Date  . Hernia repair     Family  History  Problem Relation Age of Onset  . Hypertension Mother   . Heart disease Mother     No Known Allergies  Current Outpatient Prescriptions on File Prior to Visit  Medication Sig Dispense Refill  . ACCU-CHEK COMPACT TEST DRUM test strip TEST AS DIRECTED  102 each  0  . amitriptyline (ELAVIL) 50 MG tablet Take 1 tablet (50 mg total) by mouth at bedtime.  30 tablet  11  . Folic Acid-Vit B6-Vit B12 (FOLBEE) 2.5-25-1 MG TABS Take 1 tablet by mouth 2 (two) times daily.        . Insulin Pen Needle (B-D UF III MINI PEN NEEDLES) 31G X 5 MM MISC by Does not apply route as directed.        Marland Kitchen JANUMET 50-1000 MG per tablet TAKE 1 TABLET TWICE A DAY  60 tablet  5  . oxyCODONE (OXYCONTIN) 10 MG 12 hr tablet Take 10 mg by mouth every 12 (twelve) hours.          BP 140/90  Pulse 76  Temp 98.2 F (36.8 C)  Resp 16  Ht 6' (1.829 m)  Wt 265 lb (120.203 kg)  BMI 35.94 kg/m2       Objective:   Physical Exam  Constitutional: He appears well-developed and well-nourished.  HENT:  Head: Normocephalic and atraumatic.  Eyes: Conjunctivae are normal. Pupils are equal, round, and reactive to light.  Neck: Normal  range of motion. Neck supple.  Cardiovascular: Normal rate and regular rhythm.   Pulmonary/Chest: Effort normal and breath sounds normal.  Abdominal: Soft. Bowel sounds are normal.          Assessment & Plan:  Patient has no insulin requiring diabetes with long-acting Lantus at night and additional sliding scale insulin with meals.  We discussed the risks of hypoglycemia if he takes a short acting insulin shot and has strenuous exercise.  We discussed how to take into account adjusting his insulin dosage if he plans to do his exercise by cutting it in half of what he might use if he were just going to be sedentary

## 2011-05-23 ENCOUNTER — Encounter: Payer: Self-pay | Admitting: Family

## 2011-05-23 ENCOUNTER — Telehealth: Payer: Self-pay | Admitting: *Deleted

## 2011-05-23 ENCOUNTER — Ambulatory Visit (INDEPENDENT_AMBULATORY_CARE_PROVIDER_SITE_OTHER): Payer: Commercial Managed Care - PPO | Admitting: Family

## 2011-05-23 VITALS — BP 140/84 | HR 128 | Temp 98.3°F | Wt 259.0 lb

## 2011-05-23 DIAGNOSIS — R059 Cough, unspecified: Secondary | ICD-10-CM

## 2011-05-23 DIAGNOSIS — R05 Cough: Secondary | ICD-10-CM

## 2011-05-23 DIAGNOSIS — E119 Type 2 diabetes mellitus without complications: Secondary | ICD-10-CM

## 2011-05-23 DIAGNOSIS — J209 Acute bronchitis, unspecified: Secondary | ICD-10-CM

## 2011-05-23 MED ORDER — AMOXICILLIN-POT CLAVULANATE 875-125 MG PO TABS: 1.0000 | ORAL_TABLET | Freq: Two times a day (BID) | ORAL | Status: AC

## 2011-05-23 MED ORDER — BENZONATATE 200 MG PO CAPS: 200.0000 mg | ORAL_CAPSULE | Freq: Two times a day (BID) | ORAL | Status: AC | PRN

## 2011-05-23 NOTE — Patient Instructions (Signed)

## 2011-05-23 NOTE — Progress Notes (Signed)
Subjective:    Patient ID: Martin [name], male    DOB: Jun 23, 1945, 66 y.o.   MRN: [id number]  HPI 66 year old white male, nonsmoker, patient of Dr. Lovell Sheehan in today with complaints of 2 weeks of cough, sore throat, fever, and congestion that has not responded well to Mucinex DM or Triaminic. Patient has a history of type 2 diabetes fasting blood sugar this morning was 213. He denies any lightheadedness, dizziness, shortness of breath, chest pain, palpitations, lightheadedness, or edema.   Review of Systems  Constitutional: Positive for fever and fatigue.  HENT: Positive for congestion and sneezing.   Eyes: Negative.   Respiratory: Positive for cough.   Cardiovascular: Negative.   Genitourinary: Negative.   Musculoskeletal: Negative.   Skin: Negative.   Neurological: Negative.   Hematological: Negative.    Past Medical History  Diagnosis Date  . Diabetes mellitus   . Hypertension     History   Social History  . Marital Status: Married    Spouse Name: N/A    Number of Children: N/A  . Years of Education: N/A   Occupational History  . Not on file.   Social History Main Topics  . Smoking status: Former Smoker    Quit date: 04/29/2000  . Smokeless tobacco: Never Used   Comment: smoked for 10 years on and off  . Alcohol Use: No  . Drug Use: No  . Sexually Active: Yes   Other Topics Concern  . Not on file   Social History Narrative  . No narrative on file    Past Surgical History  Procedure Date  . Hernia repair     Family History  Problem Relation Age of Onset  . Hypertension Mother   . Heart disease Mother     No Known Allergies  Current Outpatient Prescriptions on File Prior to Visit  Medication Sig Dispense Refill  . ACCU-CHEK COMPACT TEST DRUM test strip TEST AS DIRECTED  102 each  0  . amitriptyline (ELAVIL) 50 MG tablet Take 1 tablet (50 mg total) by mouth at bedtime.  30 tablet  11  . insulin glargine (LANTUS SOLOSTAR) 100 UNIT/ML injection  Inject 45 Units into the skin at bedtime.  12 mL  4  . insulin lispro (HUMALOG) 100 UNIT/ML injection Per sliding scale  10 mL  3  . Insulin Pen Needle (B-D UF III MINI PEN NEEDLES) 31G X 5 MM MISC by Does not apply route as directed.        Marland Kitchen JANUMET 50-1000 MG per tablet TAKE 1 TABLET TWICE A DAY  60 tablet  5  . Folic Acid-Vit B6-Vit B12 (FOLBEE) 2.5-25-1 MG TABS Take 1 tablet by mouth 2 (two) times daily.        Marland Kitchen oxyCODONE (OXYCONTIN) 10 MG 12 hr tablet Take 10 mg by mouth every 12 (twelve) hours.          BP 140/84  Pulse 128  Temp 98.3 F (36.8 C)  Wt 259 lb (117.482 kg)  SpO2 97%chart    Objective:   Physical Exam  Constitutional: He is oriented to person, place, and time. He appears well-developed and well-nourished.  HENT:  Right Ear: External ear normal.  Left Ear: External ear normal.  Nose: Nose normal.  Neck: Normal range of motion. Neck supple.  Cardiovascular: Normal rate, regular rhythm and normal heart sounds.   Pulmonary/Chest: Effort normal and breath sounds normal.  Musculoskeletal: Normal range of motion.  Neurological: He is alert and oriented to  person, place, and time.  Skin: Skin is warm and dry.  Psychiatric: He has a normal mood and affect.          Assessment & Plan:  Assessment: Acute bronchitis, cough, type 2 diabetes  Plan: Augmentin 875 one by mouth twice a day x10 days. Tessalon Perles 200 mg 1 capsule by mouth 3 times a day when necessary cough. Rest. Drink clear fluids. Call the office is symptoms worsen or persist, recheck a schedule and when necessary.

## 2011-05-23 NOTE — Telephone Encounter (Signed)
Ov with nurse campbell today-pt aware

## 2011-05-23 NOTE — Telephone Encounter (Signed)
Pt is having low grade fever, and severe cough.  Needs antibiotic, and cough meds, please.

## 2011-05-28 ENCOUNTER — Other Ambulatory Visit: Payer: Self-pay | Admitting: Internal Medicine

## 2011-06-19 ENCOUNTER — Other Ambulatory Visit: Payer: Self-pay | Admitting: Internal Medicine

## 2011-07-05 ENCOUNTER — Other Ambulatory Visit (INDEPENDENT_AMBULATORY_CARE_PROVIDER_SITE_OTHER): Payer: Commercial Managed Care - PPO

## 2011-07-05 DIAGNOSIS — IMO0001 Reserved for inherently not codable concepts without codable children: Secondary | ICD-10-CM

## 2011-07-05 LAB — HEMOGLOBIN A1C: Hgb A1c MFr Bld: 9.9 % — ABNORMAL HIGH (ref 4.6–6.5)

## 2011-07-15 ENCOUNTER — Encounter: Payer: Self-pay | Admitting: Internal Medicine

## 2011-07-15 ENCOUNTER — Ambulatory Visit (INDEPENDENT_AMBULATORY_CARE_PROVIDER_SITE_OTHER): Payer: Commercial Managed Care - PPO | Admitting: Internal Medicine

## 2011-07-15 VITALS — BP 140/84 | HR 100 | Temp 98.3°F | Resp 16 | Ht 72.0 in | Wt 264.0 lb

## 2011-07-15 DIAGNOSIS — J069 Acute upper respiratory infection, unspecified: Secondary | ICD-10-CM

## 2011-07-15 DIAGNOSIS — E1149 Type 2 diabetes mellitus with other diabetic neurological complication: Secondary | ICD-10-CM

## 2011-07-15 DIAGNOSIS — E1165 Type 2 diabetes mellitus with hyperglycemia: Secondary | ICD-10-CM

## 2011-07-15 DIAGNOSIS — IMO0002 Reserved for concepts with insufficient information to code with codable children: Secondary | ICD-10-CM

## 2011-07-15 DIAGNOSIS — E1142 Type 2 diabetes mellitus with diabetic polyneuropathy: Secondary | ICD-10-CM

## 2011-07-15 DIAGNOSIS — E785 Hyperlipidemia, unspecified: Secondary | ICD-10-CM

## 2011-07-15 MED ORDER — LEVOFLOXACIN 500 MG PO TABS: 500.0000 mg | ORAL_TABLET | Freq: Every day | ORAL | Status: AC

## 2011-07-15 MED ORDER — INSULIN LISPRO 100 UNIT/ML ~~LOC~~ SOLN: SUBCUTANEOUS | Status: DC

## 2011-07-15 MED ORDER — INSULIN LISPRO 100 UNIT/ML ~~LOC~~ SOLN: 25.0000 [IU] | Freq: Three times a day (TID) | SUBCUTANEOUS | Status: DC

## 2011-07-15 MED ORDER — INSULIN PEN NEEDLE 32G X 4 MM MISC: 1.0000 "pen " | Freq: Two times a day (BID) | Status: DC

## 2011-07-15 NOTE — Progress Notes (Signed)
Subjective:    Patient ID: Martin [name], male    DOB: [birth year], 66 y.o.   MRN: [id number]  HPI Pt is seen for transition from all basilar insulin to sliding scale insulin with meals.  His A1c dropped 2 points with his regimen he feels better he has been able to lose weight and feels like his exercise regimen has improved.  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  HENT: Negative for hearing loss, congestion, neck pain and postnasal drip.   Eyes: Negative for discharge, redness and visual disturbance.  Respiratory: Negative for cough, shortness of breath and wheezing.   Cardiovascular: Negative for leg swelling.  Gastrointestinal: Negative for abdominal pain, constipation and abdominal distention.  Genitourinary: Negative for urgency and frequency.  Musculoskeletal: Negative for joint swelling and arthralgias.  Skin: Negative for color change and rash.  Neurological: Negative for weakness and light-headedness.  Hematological: Negative for adenopathy.  Psychiatric/Behavioral: Negative for behavioral problems.   Past Medical History  Diagnosis Date  . Diabetes mellitus   . Hypertension     History   Social History  . Marital Status: Married    Spouse Name: N/A    Number of Children: N/A  . Years of Education: N/A   Occupational History  . Not on file.   Social History Main Topics  . Smoking status: Former Smoker    Quit date: 04/29/2000  . Smokeless tobacco: Never Used   Comment: smoked for 10 years on and off  . Alcohol Use: No  . Drug Use: No  . Sexually Active: Yes   Other Topics Concern  . Not on file   Social History Narrative  . No narrative on file    Past Surgical History  Procedure Date  . Hernia repair     Family History  Problem Relation Age of Onset  . Hypertension Mother   . Heart disease Mother     No Known  Allergies  Current Outpatient Prescriptions on File Prior to Visit  Medication Sig Dispense Refill  . ACCU-CHEK COMPACT STRIPS test strip TEST AS DIRECTED  102 each  0  . amitriptyline (ELAVIL) 50 MG tablet Take 1 tablet (50 mg total) by mouth at bedtime.  30 tablet  11  . Folic Acid-Vit B6-Vit B12 (FOLBEE) 2.5-25-1 MG TABS Take 1 tablet by mouth 2 (two) times daily.        . insulin glargine (LANTUS SOLOSTAR) 100 UNIT/ML injection Inject 45 Units into the skin at bedtime.  12 mL  4  . JANUMET 50-1000 MG per tablet TAKE 1 TABLET TWICE A DAY  60 tablet  5  . oxyCODONE (OXYCONTIN) 10 MG 12 hr tablet Take 10 mg by mouth every 12 (twelve) hours.          BP 140/84  Pulse 100  Temp 98.3 F (36.8 C)  Resp 16  Ht 6' (1.829 m)  Wt 264 lb (119.75 kg)  BMI 35.80 kg/m2        Objective:   Physical Exam  Constitutional: He appears well-developed and well-nourished.  HENT:  Head: Normocephalic and atraumatic.  Eyes: Conjunctivae are normal. Pupils are equal, round, and reactive to light.  Neck: Normal range of motion. Neck supple.  Cardiovascular: Normal rate and regular rhythm.   Pulmonary/Chest: Effort normal and breath sounds normal.  Abdominal: Soft. Bowel sounds are normal.  Assessment & Plan:  The patient has diabetes and has been on a sliding scale of Humalog with much better control.  He's using 20-25 units before meals and has noticed that his CBG results.  He will bring his glucometer next time so that we can download it but at this time he reports his CBGs have ranged between 100 2040 his A1c is improved from 11 to 9 suspect that his next visit his A1c will improve even better.  Blood pressure stable his current medications

## 2011-07-15 NOTE — Patient Instructions (Signed)
You have a sinus infection and take the Levaquin 500 mg once daily for the next 7 days

## 2011-08-19 ENCOUNTER — Other Ambulatory Visit: Payer: Self-pay | Admitting: Internal Medicine

## 2011-08-25 ENCOUNTER — Other Ambulatory Visit: Payer: Self-pay | Admitting: Internal Medicine

## 2011-10-15 ENCOUNTER — Other Ambulatory Visit (INDEPENDENT_AMBULATORY_CARE_PROVIDER_SITE_OTHER): Payer: Commercial Managed Care - PPO

## 2011-10-15 DIAGNOSIS — E1169 Type 2 diabetes mellitus with other specified complication: Secondary | ICD-10-CM

## 2011-10-15 DIAGNOSIS — IMO0002 Reserved for concepts with insufficient information to code with codable children: Secondary | ICD-10-CM

## 2011-10-15 DIAGNOSIS — E785 Hyperlipidemia, unspecified: Secondary | ICD-10-CM

## 2011-10-15 DIAGNOSIS — E1165 Type 2 diabetes mellitus with hyperglycemia: Secondary | ICD-10-CM

## 2011-10-15 LAB — HEPATIC FUNCTION PANEL
Bilirubin, Direct: 0 mg/dL (ref 0.0–0.3)
Total Bilirubin: 0.5 mg/dL (ref 0.3–1.2)
Total Protein: 7.4 g/dL (ref 6.0–8.3)

## 2011-10-15 LAB — LIPID PANEL
HDL: 38.2 mg/dL — ABNORMAL LOW (ref >39.00)
Triglycerides: 174 mg/dL — ABNORMAL HIGH (ref 0.0–149.0)

## 2011-10-16 LAB — LDL CHOLESTEROL, DIRECT: Direct LDL: 163 mg/dL

## 2011-10-22 ENCOUNTER — Ambulatory Visit (INDEPENDENT_AMBULATORY_CARE_PROVIDER_SITE_OTHER): Payer: Commercial Managed Care - PPO | Admitting: Internal Medicine

## 2011-10-22 ENCOUNTER — Encounter: Payer: Self-pay | Admitting: Internal Medicine

## 2011-10-22 VITALS — BP 146/80 | HR 88 | Temp 98.2°F | Resp 16 | Ht 72.0 in | Wt 275.0 lb

## 2011-10-22 DIAGNOSIS — E1149 Type 2 diabetes mellitus with other diabetic neurological complication: Secondary | ICD-10-CM

## 2011-10-22 DIAGNOSIS — E1142 Type 2 diabetes mellitus with diabetic polyneuropathy: Secondary | ICD-10-CM

## 2011-10-22 DIAGNOSIS — E1165 Type 2 diabetes mellitus with hyperglycemia: Secondary | ICD-10-CM

## 2011-10-22 DIAGNOSIS — IMO0002 Reserved for concepts with insufficient information to code with codable children: Secondary | ICD-10-CM

## 2011-10-22 DIAGNOSIS — E119 Type 2 diabetes mellitus without complications: Secondary | ICD-10-CM

## 2011-10-22 DIAGNOSIS — E785 Hyperlipidemia, unspecified: Secondary | ICD-10-CM

## 2011-10-22 DIAGNOSIS — I10 Essential (primary) hypertension: Secondary | ICD-10-CM

## 2011-10-22 NOTE — Patient Instructions (Addendum)
"  paleo" diet See handout

## 2011-10-22 NOTE — Progress Notes (Signed)
Subjective:    Patient ID: Martin [name], male    DOB: [birth year], 66 y.o.   MRN: [id number]  HPI Follow up for DM   A1C went up to 10.3 from 9.9 Liver functions stable Lipids stable but LDL is 163 Has stopped smoking Has gained 10 lbs    Review of Systems  Constitutional: Negative for fever and fatigue.  HENT: Negative for hearing loss, congestion, neck pain and postnasal drip.   Eyes: Negative for discharge, redness and visual disturbance.  Respiratory: Negative for cough, shortness of breath and wheezing.   Cardiovascular: Negative for leg swelling.  Gastrointestinal: Negative for abdominal pain, constipation and abdominal distention.  Genitourinary: Negative for urgency and frequency.  Musculoskeletal: Negative for joint swelling and arthralgias.  Skin: Negative for color change and rash.  Neurological: Negative for weakness and light-headedness.  Hematological: Negative for adenopathy.  Psychiatric/Behavioral: Negative for behavioral problems.   Past Medical History  Diagnosis Date  . Diabetes mellitus   . Hypertension     History   Social History  . Marital Status: Married    Spouse Name: N/A    Number of Children: N/A  . Years of Education: N/A   Occupational History  . Not on file.   Social History Main Topics  . Smoking status: Former Smoker    Quit date: 04/29/2000  . Smokeless tobacco: Never Used   Comment: smoked for 10 years on and off  . Alcohol Use: No  . Drug Use: No  . Sexually Active: Yes   Other Topics Concern  . Not on file   Social History Narrative  . No narrative on file    Past Surgical History  Procedure Date  . Hernia repair     Family History  Problem Relation Age of Onset  . Hypertension Mother   . Heart disease Mother     No Known Allergies  Current Outpatient Prescriptions on File Prior to Visit  Medication Sig Dispense Refill  . ACCU-CHEK COMPACT TEST DRUM test strip TEST AS DIRECTED  100 each  3  .  amitriptyline (ELAVIL) 50 MG tablet TAKE 1 TABLET BY MOUTH AT BEDTIME  30 tablet  11  . Folic Acid-Vit B6-Vit B12 (FOLBEE) 2.5-25-1 MG TABS Take 1 tablet by mouth 2 (two) times daily.        . insulin glargine (LANTUS SOLOSTAR) 100 UNIT/ML injection Inject 45 Units into the skin at bedtime.  12 mL  4  . insulin lispro (HUMALOG PEN) 100 UNIT/ML injection Inject 25 Units into the skin 3 (three) times daily before meals.  10 mL  12  . Insulin Pen Needle 32G X 4 MM MISC 1 pen by Does not apply route 2 (two) times daily.  100 each  11  . JANUMET 50-1000 MG per tablet TAKE 1 TABLET TWICE A DAY  60 tablet  5    BP 146/80  Pulse 88  Temp 98.2 F (36.8 C)  Resp 16  Ht 6' (1.829 m)  Wt 275 lb (124.739 kg)  BMI 37.30 kg/m2        Objective:   Physical Exam  Nursing note and vitals reviewed. Constitutional: He appears well-developed and well-nourished.  HENT:  Head: Normocephalic and atraumatic.  Eyes: Conjunctivae are normal. Pupils are equal, round, and reactive to light.  Neck: Normal range of motion. Neck supple.  Cardiovascular: Normal rate and regular rhythm.   Pulmonary/Chest: Effort normal and breath sounds normal.  Abdominal: Soft. Bowel sounds are normal.  Assessment & Plan:  His has adult-onset diabetes with poor dietary compliance.  His weight has increased 10 pounds with the addition of insulin although he states he feels like his blood sugars are better certainly the weight plays a role in his A1c being the same or worse than it was prior to the initiation of the sliding scale insulin.  Today we spent most of our time going over dietary principles gave him handouts for diet discussed in detail with his meals should look like in counseling about avoidance of gluten.  He will attempt to follow this diet for the next 3-4 months and we'll remeasure his lab values will not change his medication at this time since dietary noncompliance is the primary driving factor  behind his poor performance.

## 2011-11-17 ENCOUNTER — Other Ambulatory Visit: Payer: Self-pay | Admitting: Internal Medicine

## 2012-02-13 ENCOUNTER — Other Ambulatory Visit (INDEPENDENT_AMBULATORY_CARE_PROVIDER_SITE_OTHER): Payer: Commercial Managed Care - PPO

## 2012-02-13 DIAGNOSIS — E785 Hyperlipidemia, unspecified: Secondary | ICD-10-CM

## 2012-02-13 DIAGNOSIS — IMO0002 Reserved for concepts with insufficient information to code with codable children: Secondary | ICD-10-CM

## 2012-02-13 DIAGNOSIS — E1165 Type 2 diabetes mellitus with hyperglycemia: Secondary | ICD-10-CM

## 2012-02-13 LAB — LIPID PANEL
Cholesterol: 223 mg/dL — ABNORMAL HIGH (ref 0–200)
VLDL: 39.6 mg/dL (ref 0.0–40.0)

## 2012-02-13 LAB — HEPATIC FUNCTION PANEL
ALT: 30 U/L (ref 0–53)
AST: 30 U/L (ref 0–37)
Albumin: 4 g/dL (ref 3.5–5.2)
Total Protein: 7.4 g/dL (ref 6.0–8.3)

## 2012-02-21 ENCOUNTER — Encounter: Payer: Self-pay | Admitting: Internal Medicine

## 2012-02-21 ENCOUNTER — Ambulatory Visit (INDEPENDENT_AMBULATORY_CARE_PROVIDER_SITE_OTHER): Payer: Commercial Managed Care - PPO | Admitting: Internal Medicine

## 2012-02-21 VITALS — BP 150/84 | HR 72 | Temp 98.2°F | Resp 16 | Ht 72.0 in | Wt 279.0 lb

## 2012-02-21 DIAGNOSIS — I10 Essential (primary) hypertension: Secondary | ICD-10-CM

## 2012-02-21 DIAGNOSIS — E1169 Type 2 diabetes mellitus with other specified complication: Secondary | ICD-10-CM

## 2012-02-21 DIAGNOSIS — N139 Obstructive and reflux uropathy, unspecified: Secondary | ICD-10-CM

## 2012-02-21 DIAGNOSIS — IMO0002 Reserved for concepts with insufficient information to code with codable children: Secondary | ICD-10-CM

## 2012-02-21 DIAGNOSIS — N401 Enlarged prostate with lower urinary tract symptoms: Secondary | ICD-10-CM

## 2012-02-21 DIAGNOSIS — E1165 Type 2 diabetes mellitus with hyperglycemia: Secondary | ICD-10-CM

## 2012-02-21 DIAGNOSIS — N138 Other obstructive and reflux uropathy: Secondary | ICD-10-CM

## 2012-02-21 MED ORDER — LISINOPRIL 10 MG PO TABS: 10.0000 mg | ORAL_TABLET | Freq: Every day | ORAL | Status: DC

## 2012-02-21 NOTE — Patient Instructions (Signed)
Your blood pressure has been creeping up and in diabetes elevated blood pressure is risky for renal disease. Therefore we are going to start you on a low dose of lisinopril to both lower your blood pressure and protect your kidneys

## 2012-02-21 NOTE — Progress Notes (Signed)
Subjective:    Patient ID: Martin [name], male    DOB: 10/20/1945, 66 y.o.   MRN: [id number]  HPI The patient has noted cbgs in the 130-170 and has been taking the humalog "intermitantly" He needs to take the shot prior to meals His a1c is stoill 10 due to this non compliance  Review of Systems  Constitutional: Negative for fever and fatigue.  HENT: Negative for hearing loss, congestion, neck pain and postnasal drip.   Eyes: Negative for discharge, redness and visual disturbance.  Respiratory: Negative for cough, shortness of breath and wheezing.   Cardiovascular: Negative for leg swelling.  Gastrointestinal: Negative for abdominal pain, constipation and abdominal distention.  Genitourinary: Negative for urgency and frequency.  Musculoskeletal: Negative for joint swelling and arthralgias.  Skin: Negative for color change and rash.  Neurological: Negative for weakness and light-headedness.  Hematological: Negative for adenopathy.  Psychiatric/Behavioral: Negative for behavioral problems.       Past Medical History  Diagnosis Date  . Diabetes mellitus   . Hypertension     History   Social History  . Marital Status: Married    Spouse Name: N/A    Number of Children: N/A  . Years of Education: N/A   Occupational History  . Not on file.   Social History Main Topics  . Smoking status: Former Smoker    Quit date: 04/29/2000  . Smokeless tobacco: Never Used   Comment: smoked for 10 years on and off  . Alcohol Use: No  . Drug Use: No  . Sexually Active: Yes   Other Topics Concern  . Not on file   Social History Narrative  . No narrative on file    Past Surgical History  Procedure Date  . Hernia repair     Family History  Problem Relation Age of Onset  . Hypertension Mother   . Heart disease Mother     No Known Allergies  Current Outpatient Prescriptions on File Prior to Visit  Medication Sig Dispense Refill  . ACCU-CHEK COMPACT TEST DRUM test strip  TEST AS DIRECTED  100 each  3  . amitriptyline (ELAVIL) 50 MG tablet TAKE 1 TABLET BY MOUTH AT BEDTIME  30 tablet  11  . Folic Acid-Vit B6-Vit B12 (FOLBEE) 2.5-25-1 MG TABS Take 1 tablet by mouth 2 (two) times daily.        . insulin glargine (LANTUS SOLOSTAR) 100 UNIT/ML injection Inject 45 Units into the skin at bedtime.  12 mL  4  . insulin lispro (HUMALOG PEN) 100 UNIT/ML injection Inject 25 Units into the skin 3 (three) times daily before meals.  10 mL  12  . Insulin Pen Needle 32G X 4 MM MISC 1 pen by Does not apply route 2 (two) times daily.  100 each  11  . JANUMET 50-1000 MG per tablet TAKE 1 TABLET TWICE A DAY  60 tablet  5  . lisinopril (PRINIVIL,ZESTRIL) 10 MG tablet Take 1 tablet (10 mg total) by mouth daily.  90 tablet  3    BP 150/84  Pulse 72  Temp 98.2 F (36.8 C)  Resp 16  Ht 6' (1.829 m)  Wt 279 lb (126.554 kg)  BMI 37.84 kg/m2    Objective:   Physical Exam  Nursing note and vitals reviewed. Constitutional: He appears well-developed and well-nourished.  HENT:  Head: Normocephalic and atraumatic.  Eyes: Conjunctivae normal are normal. Pupils are equal, round, and reactive to light.  Neck: Normal range of motion. Neck  supple.  Cardiovascular: Normal rate and regular rhythm.   Pulmonary/Chest: Effort normal and breath sounds normal.  Abdominal: Soft. Bowel sounds are normal.          Assessment & Plan:  We stressed the need to take her mealtime insulin as directed with each meal.  This will keep his blood sugars in better uniform control even though his morning blood glucoses are better I suspect that is postprandial glucoses after lunch and after dinner are elevated.  We have added a low dose of lisinopril for renal protection we will continue the Janumet.

## 2012-03-23 ENCOUNTER — Other Ambulatory Visit: Payer: Self-pay | Admitting: Internal Medicine

## 2012-06-09 ENCOUNTER — Other Ambulatory Visit: Payer: Self-pay | Admitting: Internal Medicine

## 2012-06-10 ENCOUNTER — Other Ambulatory Visit: Payer: Self-pay | Admitting: Internal Medicine

## 2012-06-10 MED ORDER — SITAGLIPTIN PHOS-METFORMIN HCL 50-1000 MG PO TABS: 1.0000 | ORAL_TABLET | Freq: Two times a day (BID) | ORAL | Status: DC

## 2012-06-13 ENCOUNTER — Other Ambulatory Visit: Payer: Self-pay | Admitting: Internal Medicine

## 2012-06-16 ENCOUNTER — Other Ambulatory Visit: Payer: Commercial Managed Care - PPO

## 2012-06-23 ENCOUNTER — Ambulatory Visit: Payer: Commercial Managed Care - PPO | Admitting: Internal Medicine

## 2012-07-09 ENCOUNTER — Telehealth: Payer: Self-pay | Admitting: Internal Medicine

## 2012-07-09 MED ORDER — INSULIN LISPRO 100 UNIT/ML KWIKPEN: 35.0000 [IU] | Freq: Three times a day (TID) | SUBCUTANEOUS | Status: DC

## 2012-07-09 MED ORDER — INSULIN LISPRO 100 UNIT/ML ~~LOC~~ SOLN: 35.0000 [IU] | Freq: Three times a day (TID) | SUBCUTANEOUS | Status: DC

## 2012-07-09 NOTE — Telephone Encounter (Signed)
Pharmacist stated that [name] quick pen order, does not match the RX. Please contact pharmacist with correct information. (Jessica P. From [facility])

## 2012-07-09 NOTE — Telephone Encounter (Signed)
Changed script after talking with pt and pharmacy

## 2012-09-01 ENCOUNTER — Other Ambulatory Visit: Payer: Self-pay | Admitting: Internal Medicine

## 2012-09-02 ENCOUNTER — Other Ambulatory Visit (INDEPENDENT_AMBULATORY_CARE_PROVIDER_SITE_OTHER): Payer: Commercial Managed Care - PPO

## 2012-09-02 DIAGNOSIS — I10 Essential (primary) hypertension: Secondary | ICD-10-CM

## 2012-09-02 DIAGNOSIS — N401 Enlarged prostate with lower urinary tract symptoms: Secondary | ICD-10-CM

## 2012-09-02 DIAGNOSIS — IMO0001 Reserved for inherently not codable concepts without codable children: Secondary | ICD-10-CM

## 2012-09-02 LAB — BASIC METABOLIC PANEL
BUN: 14 mg/dL (ref 6–23)
Chloride: 101 mEq/L (ref 96–112)
Potassium: 4.6 mEq/L (ref 3.5–5.1)
Sodium: 135 mEq/L (ref 135–145)

## 2012-09-02 LAB — PSA: PSA: 0.28 ng/mL (ref 0.10–4.00)

## 2012-09-04 ENCOUNTER — Other Ambulatory Visit: Payer: Self-pay | Admitting: Internal Medicine

## 2012-09-09 ENCOUNTER — Ambulatory Visit: Payer: Commercial Managed Care - PPO | Admitting: Internal Medicine

## 2012-09-10 ENCOUNTER — Ambulatory Visit (INDEPENDENT_AMBULATORY_CARE_PROVIDER_SITE_OTHER): Payer: Commercial Managed Care - PPO | Admitting: Internal Medicine

## 2012-09-10 ENCOUNTER — Encounter: Payer: Self-pay | Admitting: Internal Medicine

## 2012-09-10 VITALS — BP 140/84 | HR 84 | Temp 98.0°F | Resp 16 | Ht <= 58 in | Wt 284.0 lb

## 2012-09-10 DIAGNOSIS — I10 Essential (primary) hypertension: Secondary | ICD-10-CM

## 2012-09-10 DIAGNOSIS — E119 Type 2 diabetes mellitus without complications: Secondary | ICD-10-CM

## 2012-09-10 NOTE — Patient Instructions (Signed)
The patient is instructed to continue all medications as prescribed. Schedule followup with check out clerk upon leaving the clinic  

## 2012-09-10 NOTE — Progress Notes (Signed)
Subjective:    Patient ID: Martin [name], male    DOB: June 04, 1945, 67 y.o.   MRN: [id number]  Diabetes Associated symptoms include weakness. Pertinent negatives for diabetes include no fatigue.  Hypertension Pertinent negatives include no neck pain or shortness of breath.   Has noted better CBGs in the 100-120 range  Had one hypoglycemic episode in the last few weeks Using the insulin has resulted in moderate weight gain   Review of Systems  Constitutional: Positive for activity change. Negative for fever and fatigue.  HENT: Negative for hearing loss, congestion, neck pain and postnasal drip.   Eyes: Negative for discharge, redness and visual disturbance.  Respiratory: Negative for cough, shortness of breath and wheezing.   Cardiovascular: Negative for leg swelling.  Gastrointestinal: Negative for abdominal pain, constipation and abdominal distention.  Genitourinary: Negative for urgency and frequency.  Musculoskeletal: Negative for joint swelling and arthralgias.  Skin: Negative for color change and rash.  Neurological: Positive for weakness and numbness. Negative for light-headedness.       In feet  Hematological: Negative for adenopathy.  Psychiatric/Behavioral: Negative for behavioral problems.   Past Medical History  Diagnosis Date  . Diabetes mellitus   . Hypertension     History   Social History  . Marital Status: Married    Spouse Name: N/A    Number of Children: N/A  . Years of Education: N/A   Occupational History  . Not on file.   Social History Main Topics  . Smoking status: Former Smoker    Quit date: 04/29/2000  . Smokeless tobacco: Never Used     Comment: smoked for 10 years on and off  . Alcohol Use: No  . Drug Use: No  . Sexually Active: Yes   Other Topics Concern  . Not on file   Social History Narrative  . No narrative on file    Past Surgical History  Procedure Laterality Date  . Hernia repair      Family History  Problem  Relation Age of Onset  . Hypertension Mother   . Heart disease Mother     No Known Allergies  Current Outpatient Prescriptions on File Prior to Visit  Medication Sig Dispense Refill  . ACCU-CHEK COMPACT TEST DRUM test strip TEST AS DIRECTED  100 each  3  . amitriptyline (ELAVIL) 50 MG tablet TAKE 1 TABLET BY MOUTH AT BEDTIME  30 tablet  11  . BD PEN NEEDLE NANO U/F 32G X 4 MM MISC 1 PEN BY DOES NOT APPLY ROUTE 2 (TWO) TIMES DAILY.  100 each  6  . Folic Acid-Vit B6-Vit B12 (FOLBEE) 2.5-25-1 MG TABS Take 1 tablet by mouth 2 (two) times daily.        . insulin glargine (LANTUS SOLOSTAR) 100 UNIT/ML injection Inject 45 Units into the skin at bedtime.  12 mL  4  . insulin lispro (HUMALOG KWIKPEN) 100 UNIT/ML injection Inject 35 Units into the skin 3 (three) times daily before meals.  10 mL  12  . Insulin Pen Needle 32G X 4 MM MISC 1 pen by Does not apply route 2 (two) times daily.  100 each  11  . JANUMET 50-1000 MG per tablet TAKE 1 TABLET TWICE A DAY  60 tablet  5  . LANTUS SOLOSTAR 100 UNIT/ML injection USE AS DIRECTED  15 mL  9  . lisinopril (PRINIVIL,ZESTRIL) 10 MG tablet Take 1 tablet (10 mg total) by mouth daily.  90 tablet  3   No  current facility-administered medications on file prior to visit.    BP 140/84  Pulse 84  Temp(Src) 98 F (36.7 C)  Resp 16  Ht 4' (1.219 m)  Wt 284 lb (128.822 kg)  BMI 86.69 kg/m2        Objective:   Physical Exam  Nursing note and vitals reviewed. Constitutional: He appears well-developed and well-nourished.  HENT:  Head: Normocephalic and atraumatic.  Eyes: Conjunctivae are normal. Pupils are equal, round, and reactive to light.  Neck: Normal range of motion. Neck supple.  Cardiovascular: Normal rate and regular rhythm.   Murmur heard. Pulmonary/Chest: Effort normal and breath sounds normal.  Abdominal: Soft. Bowel sounds are normal.          Assessment & Plan:  Discussion about adjusting the insulin with increased  activity reviewed sliding scale HTN stable reviewed labs

## 2012-09-10 NOTE — Assessment & Plan Note (Signed)
Permanent CBGs in A1c.  Some of the improvement was counteracted by weight gain if we can continue to keep her blood glucose readings and weight loss I believe that the A1c went back into the sevens

## 2012-12-07 ENCOUNTER — Other Ambulatory Visit (INDEPENDENT_AMBULATORY_CARE_PROVIDER_SITE_OTHER): Payer: Commercial Managed Care - PPO

## 2012-12-07 DIAGNOSIS — E119 Type 2 diabetes mellitus without complications: Secondary | ICD-10-CM

## 2012-12-07 DIAGNOSIS — I10 Essential (primary) hypertension: Secondary | ICD-10-CM

## 2012-12-07 LAB — BASIC METABOLIC PANEL
BUN: 10 mg/dL (ref 6–23)
CO2: 26 mEq/L (ref 19–32)
Chloride: 105 mEq/L (ref 96–112)
Creatinine, Ser: 1 mg/dL (ref 0.4–1.5)

## 2012-12-07 LAB — LIPID PANEL
Cholesterol: 213 mg/dL — ABNORMAL HIGH (ref 0–200)
VLDL: 40 mg/dL (ref 0.0–40.0)

## 2012-12-08 ENCOUNTER — Other Ambulatory Visit: Payer: Self-pay | Admitting: Internal Medicine

## 2012-12-11 ENCOUNTER — Ambulatory Visit: Payer: Commercial Managed Care - PPO | Admitting: Internal Medicine

## 2012-12-14 ENCOUNTER — Other Ambulatory Visit: Payer: Self-pay | Admitting: *Deleted

## 2012-12-14 ENCOUNTER — Encounter: Payer: Self-pay | Admitting: Internal Medicine

## 2012-12-14 ENCOUNTER — Ambulatory Visit (INDEPENDENT_AMBULATORY_CARE_PROVIDER_SITE_OTHER): Payer: Commercial Managed Care - PPO | Admitting: Internal Medicine

## 2012-12-14 ENCOUNTER — Other Ambulatory Visit: Payer: Self-pay | Admitting: Internal Medicine

## 2012-12-14 VITALS — BP 140/80 | HR 80 | Temp 98.2°F | Resp 18 | Ht 72.0 in | Wt 288.0 lb

## 2012-12-14 DIAGNOSIS — E119 Type 2 diabetes mellitus without complications: Secondary | ICD-10-CM

## 2012-12-14 DIAGNOSIS — IMO0002 Reserved for concepts with insufficient information to code with codable children: Secondary | ICD-10-CM

## 2012-12-14 DIAGNOSIS — E1165 Type 2 diabetes mellitus with hyperglycemia: Secondary | ICD-10-CM

## 2012-12-14 MED ORDER — GLUCOSE BLOOD VI STRP: 100.0000 | ORAL_STRIP | Freq: Three times a day (TID) | Status: DC

## 2012-12-14 MED ORDER — LOSARTAN POTASSIUM 50 MG PO TABS: 50.0000 mg | ORAL_TABLET | Freq: Every day | ORAL | Status: DC

## 2012-12-14 MED ORDER — ALOGLIPTIN-PIOGLITAZONE 12.5-15 MG PO TABS: 1.0000 | ORAL_TABLET | Freq: Every day | ORAL | Status: DC

## 2012-12-14 NOTE — Progress Notes (Signed)
Subjective:    Patient ID: Martin [name], male    DOB: [birth year], 67 y.o.   MRN: [id number]  HPI    Review of Systems  Constitutional: Negative for fever and fatigue.  HENT: Negative for hearing loss, congestion, neck pain and postnasal drip.   Eyes: Negative for discharge, redness and visual disturbance.  Respiratory: Negative for cough, shortness of breath and wheezing.   Cardiovascular: Negative for leg swelling.  Gastrointestinal: Negative for abdominal pain, constipation and abdominal distention.  Genitourinary: Negative for urgency and frequency.  Musculoskeletal: Negative for joint swelling and arthralgias.  Skin: Negative for color change and rash.  Neurological: Negative for weakness and light-headedness.  Hematological: Negative for adenopathy.  Psychiatric/Behavioral: Negative for behavioral problems.   Past Medical History  Diagnosis Date  . Diabetes mellitus   . Hypertension     History   Social History  . Marital Status: Married    Spouse Name: N/A    Number of Children: N/A  . Years of Education: N/A   Occupational History  . Not on file.   Social History Main Topics  . Smoking status: Former Smoker    Quit date: 04/29/2000  . Smokeless tobacco: Never Used     Comment: smoked for 10 years on and off  . Alcohol Use: No  . Drug Use: No  . Sexual Activity: Yes   Other Topics Concern  . Not on file   Social History Narrative  . No narrative on file    Past Surgical History  Procedure Laterality Date  . Hernia repair      Family History  Problem Relation Age of Onset  . Hypertension Mother   . Heart disease Mother     No Known Allergies  Current Outpatient Prescriptions on File Prior to Visit  Medication Sig Dispense Refill  . ACCU-CHEK COMPACT TEST DRUM test strip TEST AS DIRECTED  100 each  3  . amitriptyline (ELAVIL) 50 MG tablet TAKE 1 TABLET BY MOUTH AT BEDTIME  30 tablet  11  . BD PEN NEEDLE NANO U/F 32G X 4 MM MISC 1 PEN BY  DOES NOT APPLY ROUTE 2 (TWO) TIMES DAILY.  100 each  6  . Folic Acid-Vit B6-Vit B12 (FOLBEE) 2.5-25-1 MG TABS Take 1 tablet by mouth 2 (two) times daily.        . insulin glargine (LANTUS SOLOSTAR) 100 UNIT/ML injection Inject 45 Units into the skin at bedtime.  12 mL  4  . insulin lispro (HUMALOG KWIKPEN) 100 UNIT/ML injection Inject 35 Units into the skin 3 (three) times daily before meals.  10 mL  12  . Insulin Pen Needle 32G X 4 MM MISC 1 pen by Does not apply route 2 (two) times daily.  100 each  11  . JANUMET 50-1000 MG per tablet TAKE 1 TABLET TWICE A DAY  60 tablet  5  . JANUMET 50-1000 MG per tablet TAKE 1 TABLET TWICE A DAY  60 tablet  5  . LANTUS SOLOSTAR 100 UNIT/ML injection USE AS DIRECTED  15 mL  9  . lisinopril (PRINIVIL,ZESTRIL) 10 MG tablet Take 1 tablet (10 mg total) by mouth daily.  90 tablet  3   No current facility-administered medications on file prior to visit.    BP 140/80  Pulse 80  Temp(Src) 98.2 F (36.8 C)  Resp 18  Ht 6' (1.829 m)  Wt 288 lb (130.636 kg)  BMI 39.05 kg/m2       Objective:  Physical Exam  Nursing note and vitals reviewed. Constitutional: He appears well-developed and well-nourished.  HENT:  Head: Normocephalic and atraumatic.  Eyes: Conjunctivae are normal. Pupils are equal, round, and reactive to light.  Neck: Normal range of motion. Neck supple.  Cardiovascular: Normal rate and regular rhythm.   Murmur heard. Pulmonary/Chest: Effort normal and breath sounds normal.  Abdominal: Soft. Bowel sounds are normal.          Assessment & Plan:  The DM strips are expensive The relion strips are the least expensive The SSI has helped to loever his CBG's  Change to lorsartin due to ace cough

## 2012-12-14 NOTE — Patient Instructions (Signed)
The patient is instructed to continue all medications as prescribed. Schedule followup with check out clerk upon leaving the clinic  

## 2013-02-24 ENCOUNTER — Other Ambulatory Visit: Payer: Self-pay | Admitting: Internal Medicine

## 2013-03-08 ENCOUNTER — Other Ambulatory Visit: Payer: Self-pay | Admitting: Internal Medicine

## 2013-04-26 ENCOUNTER — Other Ambulatory Visit (INDEPENDENT_AMBULATORY_CARE_PROVIDER_SITE_OTHER): Payer: Commercial Managed Care - PPO

## 2013-04-26 DIAGNOSIS — IMO0002 Reserved for concepts with insufficient information to code with codable children: Secondary | ICD-10-CM

## 2013-04-26 DIAGNOSIS — E1165 Type 2 diabetes mellitus with hyperglycemia: Secondary | ICD-10-CM

## 2013-04-26 LAB — HEPATIC FUNCTION PANEL
ALT: 31 U/L (ref 0–53)
Total Protein: 6.9 g/dL (ref 6.0–8.3)

## 2013-05-03 ENCOUNTER — Encounter: Payer: Self-pay | Admitting: Internal Medicine

## 2013-05-03 ENCOUNTER — Other Ambulatory Visit: Payer: Self-pay | Admitting: Internal Medicine

## 2013-05-03 ENCOUNTER — Ambulatory Visit (INDEPENDENT_AMBULATORY_CARE_PROVIDER_SITE_OTHER): Payer: Commercial Managed Care - PPO | Admitting: Internal Medicine

## 2013-05-03 VITALS — BP 140/80 | HR 92 | Temp 98.0°F | Resp 18 | Ht 72.0 in | Wt 294.0 lb

## 2013-05-03 DIAGNOSIS — I10 Essential (primary) hypertension: Secondary | ICD-10-CM

## 2013-05-03 DIAGNOSIS — E1142 Type 2 diabetes mellitus with diabetic polyneuropathy: Secondary | ICD-10-CM

## 2013-05-03 DIAGNOSIS — E119 Type 2 diabetes mellitus without complications: Secondary | ICD-10-CM

## 2013-05-03 MED ORDER — SAXAGLIPTIN-METFORMIN ER 5-1000 MG PO TB24: 1.0000 | ORAL_TABLET | Freq: Every day | ORAL | Status: DC

## 2013-05-03 NOTE — Progress Notes (Signed)
Pre visit review using our clinic review tool, if applicable. No additional management support is needed unless otherwise documented below in the visit note. 

## 2013-05-03 NOTE — Progress Notes (Signed)
Subjective:    Patient ID: Martin [name], male    DOB: 1945-05-23, 68 y.o.   MRN: [id number]  HPI persistently high CBG's and A1c is 10 Has been trying to use the humalog 50 u before each meal Has been taking lantus 50 at bedtime Has noted highs but not any lows Has gained 35 lbs and this is the problem   Review of Systems  Constitutional: Negative for fever and fatigue.  HENT: Negative for congestion, hearing loss and postnasal drip.   Eyes: Negative for discharge, redness and visual disturbance.  Respiratory: Negative for cough, shortness of breath and wheezing.   Cardiovascular: Negative for leg swelling.  Gastrointestinal: Negative for abdominal pain, constipation and abdominal distention.  Genitourinary: Negative for urgency and frequency.  Musculoskeletal: Negative for arthralgias, joint swelling and neck pain.  Skin: Negative for color change and rash.  Neurological: Negative for weakness and light-headedness.  Hematological: Negative for adenopathy.  Psychiatric/Behavioral: Negative for behavioral problems.   Past Medical History  Diagnosis Date  . Diabetes mellitus   . Hypertension     History   Social History  . Marital Status: Married    Spouse Name: N/A    Number of Children: N/A  . Years of Education: N/A   Occupational History  . Not on file.   Social History Main Topics  . Smoking status: Former Smoker    Quit date: 04/29/2000  . Smokeless tobacco: Never Used     Comment: smoked for 10 years on and off  . Alcohol Use: No  . Drug Use: No  . Sexual Activity: Yes   Other Topics Concern  . Not on file   Social History Narrative  . No narrative on file    Past Surgical History  Procedure Laterality Date  . Hernia repair      Family History  Problem Relation Age of Onset  . Hypertension Mother   . Heart disease Mother     No Known Allergies  Current Outpatient Prescriptions on File Prior to Visit  Medication Sig Dispense Refill  .  ACCU-CHEK COMPACT TEST DRUM test strip TEST AS DIRECTED  100 each  3  . amitriptyline (ELAVIL) 50 MG tablet TAKE 1 TABLET BY MOUTH AT BEDTIME  30 tablet  11  . BD PEN NEEDLE NANO U/F 32G X 4 MM MISC 1 PEN BY DOES NOT APPLY ROUTE 2 (TWO) TIMES DAILY.  100 each  6  . Folic Acid-Vit M6-QHU T65 (FOLBEE) 2.5-25-1 MG TABS Take 1 tablet by mouth 2 (two) times daily.        Marland Kitchen glucose blood test strip 100 each by Other route 3 (three) times daily. Use as instructed  100 each  12  . insulin glargine (LANTUS SOLOSTAR) 100 UNIT/ML injection Inject 45 Units into the skin at bedtime.  12 mL  4  . insulin lispro (HUMALOG KWIKPEN) 100 UNIT/ML injection Inject 35 Units into the skin 3 (three) times daily before meals.  10 mL  12  . Insulin Pen Needle 32G X 4 MM MISC 1 pen by Does not apply route 2 (two) times daily.  100 each  11  . LANTUS SOLOSTAR 100 UNIT/ML SOPN USE AS DIRECTED  15 mL  9  . LANTUS SOLOSTAR 100 UNIT/ML SOPN USE AS DIRECTED  15 pen  9  . losartan (COZAAR) 50 MG tablet Take 1 tablet (50 mg total) by mouth daily.  90 tablet  3   No current facility-administered medications on  file prior to visit.    BP 140/80  Pulse 92  Temp(Src) 98 F (36.7 C)  Resp 18  Ht 6' (1.829 m)  Wt 294 lb (133.358 kg)  BMI 39.86 kg/m2   c    Objective:   Physical Exam  Constitutional: He appears well-developed and well-nourished.  HENT:  Head: Normocephalic and atraumatic.  Eyes: Conjunctivae are normal. Pupils are equal, round, and reactive to light.  Neck: Normal range of motion. Neck supple.  Cardiovascular: Normal rate and regular rhythm.   Murmur heard. Pulmonary/Chest: Effort normal and breath sounds normal.  Abdominal: Soft. Bowel sounds are normal.          Assessment & Plan:  Efforts at control have been blocked by weight gain and diet!  Add 30 u at breakfast to the tid insulin Keep on the 50 before lunch and dinner And keep the lantus

## 2013-05-06 ENCOUNTER — Other Ambulatory Visit: Payer: Self-pay | Admitting: Internal Medicine

## 2013-06-09 ENCOUNTER — Telehealth: Payer: Self-pay | Admitting: Internal Medicine

## 2013-06-09 MED ORDER — INSULIN LISPRO 100 UNIT/ML (KWIKPEN): PEN_INJECTOR | SUBCUTANEOUS | Status: DC

## 2013-06-09 NOTE — Telephone Encounter (Signed)
[facility] requesting new script for HUMALOG KWIKPEN 100 UNIT/ML KiwkPen 2 boxes per month.

## 2013-06-09 NOTE — Telephone Encounter (Signed)
rx sent in electronically 

## 2013-07-15 ENCOUNTER — Telehealth: Payer: Self-pay | Admitting: Internal Medicine

## 2013-07-15 DIAGNOSIS — R0989 Other specified symptoms and signs involving the circulatory and respiratory systems: Secondary | ICD-10-CM

## 2013-07-15 NOTE — Telephone Encounter (Signed)
Pt states that he needs to have a carotid duplex. He was told by someone 2 days ago that it was ordered, but was not. Please assist.

## 2013-07-16 NOTE — Telephone Encounter (Signed)
Order placed

## 2013-07-20 ENCOUNTER — Ambulatory Visit (HOSPITAL_COMMUNITY): Payer: Commercial Managed Care - PPO | Attending: Internal Medicine | Admitting: Cardiology

## 2013-07-20 DIAGNOSIS — I6529 Occlusion and stenosis of unspecified carotid artery: Secondary | ICD-10-CM | POA: Insufficient documentation

## 2013-07-20 DIAGNOSIS — R0989 Other specified symptoms and signs involving the circulatory and respiratory systems: Secondary | ICD-10-CM

## 2013-07-20 NOTE — Progress Notes (Signed)
Carotid duplex complete 

## 2013-07-27 ENCOUNTER — Other Ambulatory Visit (INDEPENDENT_AMBULATORY_CARE_PROVIDER_SITE_OTHER): Payer: Commercial Managed Care - PPO

## 2013-07-27 DIAGNOSIS — E1142 Type 2 diabetes mellitus with diabetic polyneuropathy: Secondary | ICD-10-CM

## 2013-07-27 DIAGNOSIS — E119 Type 2 diabetes mellitus without complications: Secondary | ICD-10-CM

## 2013-07-27 LAB — HEMOGLOBIN A1C: HEMOGLOBIN A1C: 10.3 % — AB (ref 4.6–6.5)

## 2013-08-03 ENCOUNTER — Encounter: Payer: Self-pay | Admitting: Family

## 2013-08-03 ENCOUNTER — Ambulatory Visit (INDEPENDENT_AMBULATORY_CARE_PROVIDER_SITE_OTHER): Payer: Commercial Managed Care - PPO | Admitting: Family

## 2013-08-03 VITALS — BP 148/62 | HR 67 | Ht 72.0 in | Wt 300.0 lb

## 2013-08-03 DIAGNOSIS — IMO0002 Reserved for concepts with insufficient information to code with codable children: Secondary | ICD-10-CM

## 2013-08-03 DIAGNOSIS — E1165 Type 2 diabetes mellitus with hyperglycemia: Principal | ICD-10-CM

## 2013-08-03 DIAGNOSIS — I10 Essential (primary) hypertension: Secondary | ICD-10-CM

## 2013-08-03 DIAGNOSIS — E1140 Type 2 diabetes mellitus with diabetic neuropathy, unspecified: Secondary | ICD-10-CM

## 2013-08-03 DIAGNOSIS — E1149 Type 2 diabetes mellitus with other diabetic neurological complication: Secondary | ICD-10-CM

## 2013-08-03 DIAGNOSIS — E1142 Type 2 diabetes mellitus with diabetic polyneuropathy: Secondary | ICD-10-CM

## 2013-08-03 NOTE — Progress Notes (Signed)
Pre visit review using our clinic review tool, if applicable. No additional management support is needed unless otherwise documented below in the visit note. 

## 2013-08-03 NOTE — Patient Instructions (Signed)
Start Levemir at 37 units  Increase by 2 units every 3 days,  for fasting blood sugar greater than 120.  Diabetes and Exercise Exercising regularly is important. It is not just about losing weight. It has many health benefits, such as:  Improving your overall fitness, flexibility, and endurance.  Increasing your bone density.  Helping with weight control.  Decreasing your body fat.  Increasing your muscle strength.  Reducing stress and tension.  Improving your overall health. People with diabetes who exercise gain additional benefits because exercise:  Reduces appetite.  Improves the body's use of blood sugar (glucose).  Helps lower or control blood glucose.  Decreases blood pressure.  Helps control blood lipids (such as cholesterol and triglycerides).  Improves the body's use of the hormone insulin by:  Increasing the body's insulin sensitivity.  Reducing the body's insulin needs.  Decreases the risk for heart disease because exercising:  Lowers cholesterol and triglycerides levels.  Increases the levels of good cholesterol (such as high-density lipoproteins [HDL]) in the body.  Lowers blood glucose levels. YOUR ACTIVITY PLAN  Choose an activity that you enjoy and set realistic goals. Your health care provider or diabetes educator can help you make an activity plan that works for you. You can break activities into 2 or 3 sessions throughout the day. Doing so is as good as one long session. Exercise ideas include:  Taking the dog for a walk.  Taking the stairs instead of the elevator.  Dancing to your favorite song.  Doing your favorite exercise with a friend. RECOMMENDATIONS FOR EXERCISING WITH TYPE 1 OR TYPE 2 DIABETES   Check your blood glucose before exercising. If blood glucose levels are greater than 240 mg/dL, check for urine ketones. Do not exercise if ketones are present.  Avoid injecting insulin into areas of the body that are going to be exercised.  For example, avoid injecting insulin into:  The arms when playing tennis.  The legs when jogging.  Keep a record of:  Food intake before and after you exercise.  Expected peak times of insulin action.  Blood glucose levels before and after you exercise.  The type and amount of exercise you have done.  Review your records with your health care provider. Your health care provider will help you to develop guidelines for adjusting food intake and insulin amounts before and after exercising.  If you take insulin or oral hypoglycemic agents, watch for signs and symptoms of hypoglycemia. They include:  Dizziness.  Shaking.  Sweating.  Chills.  Confusion.  Drink plenty of water while you exercise to prevent dehydration or heat stroke. Body water is lost during exercise and must be replaced.  Talk to your health care provider before starting an exercise program to make sure it is safe for you. Remember, almost any type of activity is better than none. Document Released: 07/06/2003 Document Revised: 12/16/2012 Document Reviewed: 09/22/2012 [facility] Patient Information 2014 [place].

## 2013-08-03 NOTE — Progress Notes (Signed)
Subjective:    Patient ID: Martin [name], male    DOB: [birth year], 68 y.o.   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 Is currently frustrated with his blood sugars have been well controlled. Reports fasting blood sugar readings in the 300s. Reports often forgetting to take his evening dose of medications. Has stopped taking Lantus because he feels it was not working. Is not exercising due to osteoarthritis and neuropathy. However does not want any pharmacological relief for pain. Does not want to see Endocrinology. Fasting blood sugars in the 300s. Post prandial readings are in the 200s. Has Novolog that he sometimes takes with meal.   Review of Systems  Constitutional: Negative.   HENT: Negative.   Eyes: Negative.   Respiratory: Negative.   Cardiovascular: Negative.   Gastrointestinal: Negative.   Endocrine: Negative.   Genitourinary: Negative.   Musculoskeletal: Negative.   Skin: Negative.   Allergic/Immunologic: Negative.   Neurological: Negative.   Hematological: Negative.   Psychiatric/Behavioral: Negative.    Past Medical History  Diagnosis Date  . Diabetes mellitus   . Hypertension     History   Social History  . Marital Status: Married    Spouse Name: N/A    Number of Children: N/A  . Years of Education: N/A   Occupational History  . Not on file.   Social History Main Topics  . Smoking status: Former Smoker    Quit date: 04/29/2000  . Smokeless tobacco: Never Used     Comment: smoked for 10 years on and off  . Alcohol Use: No  . Drug Use: No  . Sexual Activity: Yes   Other Topics Concern  . Not on file   Social History Narrative  . No narrative on file    Past Surgical History  Procedure Laterality Date  . Hernia repair      Family History  Problem Relation Age of Onset  . Hypertension Mother   . Heart disease Mother     No Known  Allergies  Current Outpatient Prescriptions on File Prior to Visit  Medication Sig Dispense Refill  . ACCU-CHEK COMPACT TEST DRUM test strip TEST AS DIRECTED  100 each  3  . amitriptyline (ELAVIL) 50 MG tablet TAKE 1 TABLET BY MOUTH AT BEDTIME  30 tablet  11  . BD PEN NEEDLE NANO U/F 32G X 4 MM MISC 1 PEN BY DOES NOT APPLY ROUTE 2 (TWO) TIMES DAILY.  100 each  6  . Folic Acid-Vit E3-PIR J18 (FOLBEE) 2.5-25-1 MG TABS Take 1 tablet by mouth 2 (two) times daily.        Marland Kitchen glucose blood test strip 100 each by Other route 3 (three) times daily. Use as instructed  100 each  12  . insulin glargine (LANTUS SOLOSTAR) 100 UNIT/ML injection Inject 45 Units into the skin at bedtime.  12 mL  4  . insulin lispro (HUMALOG KWIKPEN) 100 UNIT/ML KiwkPen INJECT 35 UNITS INTO THE SKIN 3 (THREE) TIMES DAILY BEFORE MEALS.  15 mL  1  . Insulin Pen Needle 32G X 4 MM MISC 1 pen by Does not apply route 2 (two) times daily.  100 each  11  . JANUMET 50-1000 MG per tablet TAKE 1 TABLET TWICE A DAY  60 tablet  5  . JANUMET 50-1000 MG per tablet TAKE 1 TABLET TWICE A DAY  60 tablet  11  . LANTUS SOLOSTAR  100 UNIT/ML SOPN USE AS DIRECTED  15 mL  9  . LANTUS SOLOSTAR 100 UNIT/ML SOPN USE AS DIRECTED  15 pen  9  . losartan (COZAAR) 50 MG tablet Take 1 tablet (50 mg total) by mouth daily.  90 tablet  3  . Saxagliptin-Metformin 08-998 MG TB24 Take 1 tablet by mouth daily.  30 tablet  11   No current facility-administered medications on file prior to visit.    BP 148/62  Pulse 67  Ht 6' (1.829 m)  Wt 300 lb (136.079 kg)  BMI 40.68 kg/m2chart    Objective:   Physical Exam  Constitutional: He is oriented to person, place, and time. He appears well-developed and well-nourished.  HENT:  Right Ear: External ear normal.  Left Ear: External ear normal.  Nose: Nose normal.  Mouth/Throat: Oropharynx is clear and moist.  Eyes: Pupils are equal, round, and reactive to light.  Neck: Normal range of motion. Neck supple.   Cardiovascular: Normal rate, regular rhythm and normal heart sounds.   Pulmonary/Chest: Effort normal and breath sounds normal.  Abdominal: Soft. Bowel sounds are normal.  Musculoskeletal: Normal range of motion.  Neurological: He is alert and oriented to person, place, and time.  Skin: Skin is warm and dry.  Psychiatric: He has a normal mood and affect.          Assessment & Plan:

## 2013-08-04 ENCOUNTER — Telehealth: Payer: Self-pay | Admitting: Internal Medicine

## 2013-08-04 NOTE — Telephone Encounter (Signed)
Relevant patient education mailed to patient.  

## 2013-08-17 ENCOUNTER — Ambulatory Visit (INDEPENDENT_AMBULATORY_CARE_PROVIDER_SITE_OTHER): Payer: Commercial Managed Care - PPO | Admitting: Family

## 2013-08-17 ENCOUNTER — Encounter: Payer: Self-pay | Admitting: Family

## 2013-08-17 VITALS — BP 140/60 | HR 112 | Temp 98.4°F | Ht 72.0 in | Wt 298.0 lb

## 2013-08-17 DIAGNOSIS — Z9119 Patient's noncompliance with other medical treatment and regimen: Secondary | ICD-10-CM

## 2013-08-17 DIAGNOSIS — IMO0001 Reserved for inherently not codable concepts without codable children: Secondary | ICD-10-CM

## 2013-08-17 DIAGNOSIS — R609 Edema, unspecified: Secondary | ICD-10-CM

## 2013-08-17 DIAGNOSIS — I10 Essential (primary) hypertension: Secondary | ICD-10-CM

## 2013-08-17 DIAGNOSIS — Z9114 Patient's other noncompliance with medication regimen: Secondary | ICD-10-CM

## 2013-08-17 DIAGNOSIS — E1165 Type 2 diabetes mellitus with hyperglycemia: Principal | ICD-10-CM

## 2013-08-17 DIAGNOSIS — Z91199 Patient's noncompliance with other medical treatment and regimen due to unspecified reason: Secondary | ICD-10-CM

## 2013-08-17 LAB — COMPREHENSIVE METABOLIC PANEL
ALBUMIN: 4.4 g/dL (ref 3.5–5.2)
ALT: 36 U/L (ref 0–53)
AST: 34 U/L (ref 0–37)
Alkaline Phosphatase: 56 U/L (ref 39–117)
BUN: 14 mg/dL (ref 6–23)
CHLORIDE: 97 meq/L (ref 96–112)
CO2: 26 mEq/L (ref 19–32)
Calcium: 9.8 mg/dL (ref 8.4–10.5)
Creatinine, Ser: 0.9 mg/dL (ref 0.4–1.5)
GFR: 87.79 mL/min (ref >60.00)
Glucose, Bld: 243 mg/dL — ABNORMAL HIGH (ref 70–99)
Potassium: 4.1 mEq/L (ref 3.5–5.1)
SODIUM: 134 meq/L — AB (ref 135–145)
Total Bilirubin: 1.1 mg/dL (ref 0.3–1.2)
Total Protein: 8 g/dL (ref 6.0–8.3)

## 2013-08-17 LAB — BRAIN NATRIURETIC PEPTIDE: Pro B Natriuretic peptide (BNP): 9 pg/mL (ref 0.0–100.0)

## 2013-08-17 MED ORDER — LOSARTAN POTASSIUM-HCTZ 100-12.5 MG PO TABS: 1.0000 | ORAL_TABLET | Freq: Every day | ORAL | Status: DC

## 2013-08-17 MED ORDER — INSULIN DETEMIR 100 UNIT/ML FLEXPEN: 50.0000 [IU] | PEN_INJECTOR | Freq: Every day | SUBCUTANEOUS | Status: DC

## 2013-08-17 NOTE — Progress Notes (Signed)
Pre visit review using our clinic review tool, if applicable. No additional management support is needed unless otherwise documented below in the visit note. 

## 2013-08-17 NOTE — Progress Notes (Signed)
Subjective:    Patient ID: Martin [name], male    DOB: 10-05-1945, 68 y.o.   MRN: [id number]  HPI 68 year old white male, nonsmoker with a history of type 2 diabetes-uncontrolled, medication noncompliance, hypertension is in today for recheck of his glucose. At the last office visit we started Levemir 38 units that has been increased to 50 units at bedtime. Patient reports that he often forgets to take his evening medications and his fasting blood sugars are high. Takes 50 units of Humalog with meals when he remembers.   Review of Systems  Constitutional: Negative.   HENT: Negative.   Respiratory: Negative.   Cardiovascular: Negative.   Gastrointestinal: Negative.   Endocrine: Negative.   Genitourinary: Negative.   Musculoskeletal: Negative.   Skin: Negative.   Hematological: Negative.   Psychiatric/Behavioral: Negative.    Past Medical History  Diagnosis Date  . Diabetes mellitus   . Hypertension     History   Social History  . Marital Status: Married    Spouse Name: N/A    Number of Children: N/A  . Years of Education: N/A   Occupational History  . Not on file.   Social History Main Topics  . Smoking status: Former Smoker    Quit date: 04/29/2000  . Smokeless tobacco: Never Used     Comment: smoked for 10 years on and off  . Alcohol Use: No  . Drug Use: No  . Sexual Activity: Yes   Other Topics Concern  . Not on file   Social History Narrative  . No narrative on file    Past Surgical History  Procedure Laterality Date  . Hernia repair      Family History  Problem Relation Age of Onset  . Hypertension Mother   . Heart disease Mother     No Known Allergies  Current Outpatient Prescriptions on File Prior to Visit  Medication Sig Dispense Refill  . ACCU-CHEK COMPACT TEST DRUM test strip TEST AS DIRECTED  100 each  3  . amitriptyline (ELAVIL) 50 MG tablet TAKE 1 TABLET BY MOUTH AT BEDTIME  30 tablet  11  . BD PEN NEEDLE NANO U/F 32G X 4 MM MISC 1  PEN BY DOES NOT APPLY ROUTE 2 (TWO) TIMES DAILY.  100 each  6  . Folic Acid-Vit T0-GYI R48 (FOLBEE) 2.5-25-1 MG TABS Take 1 tablet by mouth 2 (two) times daily.        Marland Kitchen glucose blood test strip 100 each by Other route 3 (three) times daily. Use as instructed  100 each  12  . insulin lispro (HUMALOG KWIKPEN) 100 UNIT/ML KiwkPen INJECT 35 UNITS INTO THE SKIN 3 (THREE) TIMES DAILY BEFORE MEALS.  15 mL  1  . Insulin Pen Needle 32G X 4 MM MISC 1 pen by Does not apply route 2 (two) times daily.  100 each  11  . JANUMET 50-1000 MG per tablet TAKE 1 TABLET TWICE A DAY  60 tablet  5  . JANUMET 50-1000 MG per tablet TAKE 1 TABLET TWICE A DAY  60 tablet  11  . Saxagliptin-Metformin 08-998 MG TB24 Take 1 tablet by mouth daily.  30 tablet  11   No current facility-administered medications on file prior to visit.    BP 140/60  Pulse 112  Temp(Src) 98.4 F (36.9 C) (Oral)  Ht 6' (1.829 m)  Wt 298 lb (135.172 kg)  BMI 40.41 kg/m2  SpO2 98%chart    Objective:   Physical Exam  Constitutional: He is oriented to person, place, and time. He appears well-developed and well-nourished.  Neck: Normal range of motion. Neck supple.  Cardiovascular: Normal rate, regular rhythm and normal heart sounds.   Pulmonary/Chest: Effort normal and breath sounds normal.  Abdominal: Soft. Bowel sounds are normal.  Musculoskeletal: Normal range of motion.  Neurological: He is alert and oriented to person, place, and time.  Skin: Skin is warm and dry.  Psychiatric: He has a normal mood and affect.          Assessment & Plan:  Anthem was seen today for follow-up and diabetes.  Diagnoses and associated orders for this visit:  Type II or unspecified type diabetes mellitus without mention of complication, uncontrolled - CMP - Brain Natriuretic Peptide  Uncontrolled hypertension - CMP - Brain Natriuretic Peptide  Peripheral edema - CMP - Brain Natriuretic Peptide  Noncompliance with medication  regimen  Other Orders - losartan-hydrochlorothiazide (HYZAAR) 100-12.5 MG per tablet; Take 1 tablet by mouth daily. - Insulin Detemir (LEVEMIR FLEXPEN) 100 UNIT/ML Pen; Inject 50 Units into the skin daily at 10 pm.   Call the office with any questions or concerns. Recheck in 4 weeks and sooner as needed. Encouraged medication compliance.

## 2013-08-17 NOTE — Patient Instructions (Signed)

## 2013-08-20 ENCOUNTER — Telehealth: Payer: Self-pay

## 2013-08-20 NOTE — Telephone Encounter (Signed)
Relevant patient education mailed to patient.  

## 2013-09-07 ENCOUNTER — Other Ambulatory Visit: Payer: Self-pay | Admitting: Internal Medicine

## 2013-09-07 ENCOUNTER — Ambulatory Visit: Payer: Commercial Managed Care - PPO | Admitting: Family

## 2013-09-21 ENCOUNTER — Encounter: Payer: Self-pay | Admitting: Family

## 2013-09-21 ENCOUNTER — Ambulatory Visit (INDEPENDENT_AMBULATORY_CARE_PROVIDER_SITE_OTHER): Payer: Commercial Managed Care - PPO | Admitting: Family

## 2013-09-21 VITALS — BP 130/70 | HR 113 | Temp 98.7°F | Ht 72.0 in | Wt 290.5 lb

## 2013-09-21 DIAGNOSIS — E7800 Pure hypercholesterolemia, unspecified: Secondary | ICD-10-CM

## 2013-09-21 DIAGNOSIS — IMO0001 Reserved for inherently not codable concepts without codable children: Secondary | ICD-10-CM

## 2013-09-21 DIAGNOSIS — E1165 Type 2 diabetes mellitus with hyperglycemia: Principal | ICD-10-CM

## 2013-09-21 DIAGNOSIS — I10 Essential (primary) hypertension: Secondary | ICD-10-CM

## 2013-09-21 NOTE — Patient Instructions (Signed)

## 2013-09-21 NOTE — Progress Notes (Signed)
Subjective:    Patient ID: Martin [name], male    DOB: [birth year], 68 y.o.   MRN: [id number]  HPI He is here for follow up on T2DM management. He reports noncompliance with medications, oral and insulin, due to meals out and falling asleep at night before shots. He needs Levemir injection pens and Humalog refill.   He was last seen for an eye exam in January, when Dr. Sabra [name] reccommended a retina exam. He has not yet scheduled this appointment.   He also reports cold feet, with swelling after standing for longer than an hour, and shooting pains in feet.   He had one hypoglycemic event approximately 2 weeks ago. His CBG was 78. He was tachycardic, shaky. Denies LOC. Symptoms resolved quickly after orange juice intake.   Review of Systems  Constitutional: Negative.   HENT: Negative.   Eyes: Negative.   Respiratory: Negative.   Cardiovascular: Negative.   Gastrointestinal: Negative.   Endocrine: Positive for cold intolerance.  Genitourinary: Negative.   Musculoskeletal: Negative.   Skin: Negative.   Allergic/Immunologic: Negative.   Neurological: Negative.   Hematological: Negative.   Psychiatric/Behavioral: Negative.    Past Medical History  Diagnosis Date  . Diabetes mellitus   . Hypertension     History   Social History  . Marital Status: Married    Spouse Name: N/A    Number of Children: N/A  . Years of Education: N/A   Occupational History  . Not on file.   Social History Main Topics  . Smoking status: Former Smoker    Quit date: 04/29/2000  . Smokeless tobacco: Never Used     Comment: smoked for 10 years on and off  . Alcohol Use: No  . Drug Use: No  . Sexual Activity: Yes   Other Topics Concern  . Not on file   Social History Narrative  . No narrative on file    Past Surgical History  Procedure Laterality Date  . Hernia repair      Family History  Problem Relation Age of Onset  . Hypertension Mother   . Heart disease Mother     No Known  Allergies  Current Outpatient Prescriptions on File Prior to Visit  Medication Sig Dispense Refill  . ACCU-CHEK COMPACT TEST DRUM test strip TEST AS DIRECTED  100 each  3  . amitriptyline (ELAVIL) 50 MG tablet TAKE 1 TABLET BY MOUTH AT BEDTIME  30 tablet  10  . BD PEN NEEDLE NANO U/F 32G X 4 MM MISC 1 PEN BY DOES NOT APPLY ROUTE 2 (TWO) TIMES DAILY.  100 each  6  . glucose blood test strip 100 each by Other route 3 (three) times daily. Use as instructed  100 each  12  . Insulin Detemir (LEVEMIR FLEXPEN) 100 UNIT/ML Pen Inject 50 Units into the skin daily at 10 pm.  15 mL  5  . insulin lispro (HUMALOG KWIKPEN) 100 UNIT/ML KiwkPen INJECT 35 UNITS INTO THE SKIN 3 (THREE) TIMES DAILY BEFORE MEALS.  15 mL  1  . Insulin Pen Needle 32G X 4 MM MISC 1 pen by Does not apply route 2 (two) times daily.  100 each  11  . JANUMET 50-1000 MG per tablet TAKE 1 TABLET TWICE A DAY  60 tablet  5  . losartan-hydrochlorothiazide (HYZAAR) 100-12.5 MG per tablet Take 1 tablet by mouth daily.  30 tablet  3  . Folic Acid-Vit H4-RDE Y81 (FOLBEE) 2.5-25-1 MG TABS Take 1 tablet by  mouth 2 (two) times daily.        . Saxagliptin-Metformin 08-998 MG TB24 Take 1 tablet by mouth daily.  30 tablet  11   No current facility-administered medications on file prior to visit.    BP 130/70  Pulse 113  Temp(Src) 98.7 F (37.1 C) (Oral)  Ht 6' (1.829 m)  Wt 290 lb 8 oz (131.77 kg)  BMI 39.39 kg/m2  SpO2 95%chart    Objective:   Physical Exam  Constitutional: He is oriented to person, place, and time. He appears well-developed and well-nourished. No distress.  HENT:  Head: Normocephalic and atraumatic.  Eyes: Conjunctivae and EOM are normal. Pupils are equal, round, and reactive to light.  Neck: Normal range of motion. Neck supple. No JVD present. No tracheal deviation present. No thyromegaly present.  Cardiovascular: Regular rhythm.  Exam reveals no gallop and no friction rub.   No murmur heard. Pulmonary/Chest: Effort  normal and breath sounds normal. No stridor. No respiratory distress. He has no wheezes. He has no rales. He exhibits no tenderness.  Abdominal: Soft. Bowel sounds are normal. He exhibits no distension and no mass. There is no tenderness. There is no rebound and no guarding.  Musculoskeletal: Normal range of motion. He exhibits no edema and no tenderness.  Neurological: He is alert and oriented to person, place, and time.  Skin: Skin is warm and dry. He is not diaphoretic.  Psychiatric: He has a normal mood and affect. His behavior is normal. Judgment and thought content normal.   Monofilament test revealed sensory loss throughout both feet. Marked discoloration noted in toes and balls of feet. No evidenced of fungal toenail changes.Weak pedal pulses bilaterally.       Assessment & Plan:  Martin [name] was seen today for diabetes.  Diagnoses and associated orders for this visit:  Type II or unspecified type diabetes mellitus without mention of complication, uncontrolled  Pure hypercholesterolemia  Unspecified essential hypertension    Recommended podiatry consult and scheduled retina follow-up per Dr. Sabra [name].

## 2013-09-21 NOTE — Progress Notes (Signed)
Pre visit review using our clinic review tool, if applicable. No additional management support is needed unless otherwise documented below in the visit note. 

## 2013-09-30 ENCOUNTER — Other Ambulatory Visit: Payer: Self-pay

## 2013-09-30 MED ORDER — INSULIN PEN NEEDLE 32G X 6 MM MISC: Status: DC

## 2013-11-26 ENCOUNTER — Other Ambulatory Visit: Payer: Self-pay | Admitting: Family

## 2013-11-26 DIAGNOSIS — E119 Type 2 diabetes mellitus without complications: Secondary | ICD-10-CM

## 2013-12-07 ENCOUNTER — Other Ambulatory Visit: Payer: Self-pay | Admitting: Internal Medicine

## 2013-12-21 ENCOUNTER — Ambulatory Visit: Payer: Commercial Managed Care - PPO | Admitting: Family

## 2014-01-11 ENCOUNTER — Encounter: Payer: Self-pay | Admitting: Family

## 2014-01-11 ENCOUNTER — Ambulatory Visit (INDEPENDENT_AMBULATORY_CARE_PROVIDER_SITE_OTHER): Payer: Commercial Managed Care - PPO | Admitting: Family

## 2014-01-11 VITALS — BP 136/80 | HR 102 | Ht 72.0 in | Wt 296.9 lb

## 2014-01-11 DIAGNOSIS — E119 Type 2 diabetes mellitus without complications: Secondary | ICD-10-CM

## 2014-01-11 DIAGNOSIS — E1165 Type 2 diabetes mellitus with hyperglycemia: Principal | ICD-10-CM

## 2014-01-11 DIAGNOSIS — E7800 Pure hypercholesterolemia, unspecified: Secondary | ICD-10-CM

## 2014-01-11 DIAGNOSIS — I10 Essential (primary) hypertension: Secondary | ICD-10-CM

## 2014-01-11 DIAGNOSIS — IMO0001 Reserved for inherently not codable concepts without codable children: Secondary | ICD-10-CM

## 2014-01-11 LAB — BASIC METABOLIC PANEL
BUN: 12 mg/dL (ref 6–23)
CHLORIDE: 104 meq/L (ref 96–112)
CO2: 26 mEq/L (ref 19–32)
Calcium: 9.2 mg/dL (ref 8.4–10.5)
Creatinine, Ser: 1.1 mg/dL (ref 0.4–1.5)
GFR: 70.45 mL/min (ref >60.00)
Glucose, Bld: 82 mg/dL (ref 70–99)
POTASSIUM: 4.4 meq/L (ref 3.5–5.1)
Sodium: 139 mEq/L (ref 135–145)

## 2014-01-11 LAB — HEPATIC FUNCTION PANEL
ALBUMIN: 4.2 g/dL (ref 3.5–5.2)
ALT: 29 U/L (ref 0–53)
AST: 35 U/L (ref 0–37)
Alkaline Phosphatase: 36 U/L — ABNORMAL LOW (ref 39–117)
Bilirubin, Direct: 0.1 mg/dL (ref 0.0–0.3)
TOTAL PROTEIN: 7.6 g/dL (ref 6.0–8.3)
Total Bilirubin: 0.9 mg/dL (ref 0.2–1.2)

## 2014-01-11 LAB — LIPID PANEL
CHOL/HDL RATIO: 7
Cholesterol: 230 mg/dL — ABNORMAL HIGH (ref 0–200)
HDL: 31.6 mg/dL — ABNORMAL LOW (ref >39.00)
NonHDL: 198.4
TRIGLYCERIDES: 209 mg/dL — AB (ref 0.0–149.0)
VLDL: 41.8 mg/dL — AB (ref 0.0–40.0)

## 2014-01-11 LAB — LDL CHOLESTEROL, DIRECT: LDL DIRECT: 189.1 mg/dL

## 2014-01-11 LAB — HEMOGLOBIN A1C: Hgb A1c MFr Bld: 9 % — ABNORMAL HIGH (ref 4.6–6.5)

## 2014-01-11 MED ORDER — INSULIN GLARGINE 100 UNIT/ML SOLOSTAR PEN: 50.0000 [IU] | PEN_INJECTOR | Freq: Every day | SUBCUTANEOUS | Status: DC

## 2014-01-11 MED ORDER — INSULIN LISPRO 100 UNIT/ML (KWIKPEN): 50.0000 [IU] | PEN_INJECTOR | Freq: Four times a day (QID) | SUBCUTANEOUS | Status: DC

## 2014-01-11 NOTE — Progress Notes (Signed)
Pre visit review using our clinic review tool, if applicable. No additional management support is needed unless otherwise documented below in the visit note. 

## 2014-01-11 NOTE — Progress Notes (Signed)
Subjective:    Patient ID: Martin [name], male    DOB: Aug 01, 1945, 68 y.o.   MRN: [id number]  HPI 68 year old white male, nonsmoker with a history of type 2 diabetes uncontrolled, hypercholesterolemia, hypertension, obesity is in today for recheck. Continues to admit to not being compliant with medication regimen. Has stopped Levemir and requested to go back to Lantus. Has been taking 50 units 4 times a day instead of 3 times per day. Does not routinely exercise.   Review of Systems  Constitutional: Negative.   HENT: Negative.   Eyes: Negative.   Respiratory: Negative.   Cardiovascular: Negative.   Gastrointestinal: Negative.   Endocrine: Negative.   Genitourinary: Negative.   Musculoskeletal: Negative.   Skin: Negative.   Allergic/Immunologic: Negative.   Neurological: Negative.   Hematological: Negative.   Psychiatric/Behavioral: Negative.    Past Medical History  Diagnosis Date  . Diabetes mellitus   . Hypertension     History   Social History  . Marital Status: Married    Spouse Name: N/A    Number of Children: N/A  . Years of Education: N/A   Occupational History  . Not on file.   Social History Main Topics  . Smoking status: Former Smoker    Quit date: 04/29/2000  . Smokeless tobacco: Never Used     Comment: smoked for 10 years on and off  . Alcohol Use: No  . Drug Use: No  . Sexual Activity: Yes   Other Topics Concern  . Not on file   Social History Narrative  . No narrative on file    Past Surgical History  Procedure Laterality Date  . Hernia repair      Family History  Problem Relation Age of Onset  . Hypertension Mother   . Heart disease Mother     No Known Allergies  Current Outpatient Prescriptions on File Prior to Visit  Medication Sig Dispense Refill  . ACCU-CHEK COMPACT TEST DRUM test strip TEST AS DIRECTED  100 each  3  . amitriptyline (ELAVIL) 50 MG tablet TAKE 1 TABLET BY MOUTH AT BEDTIME  30 tablet  10  . Folic Acid-Vit  W0-JWJ B12 (FOLBEE) 2.5-25-1 MG TABS Take 1 tablet by mouth 2 (two) times daily.        Marland Kitchen glucose blood test strip 100 each by Other route 3 (three) times daily. Use as instructed  100 each  12  . Insulin Pen Needle (EASY TOUCH PEN NEEDLES) 32G X 6 MM MISC Use 2 times daily  100 each  3  . JANUMET 50-1000 MG per tablet TAKE 1 TABLET TWICE A DAY  60 tablet  5  . losartan-hydrochlorothiazide (HYZAAR) 100-12.5 MG per tablet Take 1 tablet by mouth daily.  30 tablet  3  . Saxagliptin-Metformin 08-998 MG TB24 Take 1 tablet by mouth daily.  30 tablet  11   No current facility-administered medications on file prior to visit.    BP 136/80  Pulse 102  Ht 6' (1.829 m)  Wt 296 lb 14.4 oz (134.673 kg)  BMI 40.26 kg/m2chart     Objective:   Physical Exam  Constitutional: He is oriented to person, place, and time. He appears well-developed and well-nourished.  HENT:  Right Ear: External ear normal.  Left Ear: External ear normal.  Nose: Nose normal.  Mouth/Throat: Oropharynx is clear and moist.  Eyes: Pupils are equal, round, and reactive to light.  Neck: Normal range of motion. Neck supple.  Cardiovascular: Normal rate,  regular rhythm and normal heart sounds.   Pulmonary/Chest: Effort normal and breath sounds normal.  Abdominal: Soft. Bowel sounds are normal.  Musculoskeletal: Normal range of motion.  Neurological: He is alert and oriented to person, place, and time.  Skin: Skin is warm and dry.  Psychiatric: He has a normal mood and affect.          Assessment & Plan:  Martin [name] was seen today for follow-up.  Diagnoses and associated orders for this visit:  Type II or unspecified type diabetes mellitus without mention of complication, uncontrolled  Unspecified essential hypertension - Lipid Panel - Hepatic Function Panel  DIABETES MELLITUS, TYPE II - Hemoglobin V9Y - Basic metabolic panel - Microalbumin / creatinine urine ratio  Pure hypercholesterolemia  Other  Orders - Insulin Glargine (LANTUS SOLOSTAR) 100 UNIT/ML Solostar Pen; Inject 50 Units into the skin daily at 10 pm. - insulin lispro (HUMALOG KWIKPEN) 100 UNIT/ML KiwkPen; Inject 0.5 mLs (50 Units total) into the skin 4 (four) times daily.   Call the office with any questions or concerns. Recheck in 3 months, pending labs, sooner as needed.

## 2014-01-11 NOTE — Patient Instructions (Signed)
Diabetes and Exercise Exercising regularly is important. It is not just about losing weight. It has many health benefits, such as:  Improving your overall fitness, flexibility, and endurance.  Increasing your bone density.  Helping with weight control.  Decreasing your body fat.  Increasing your muscle strength.  Reducing stress and tension.  Improving your overall health. People with diabetes who exercise gain additional benefits because exercise:  Reduces appetite.  Improves the body's use of blood sugar (glucose).  Helps lower or control blood glucose.  Decreases blood pressure.  Helps control blood lipids (such as cholesterol and triglycerides).  Improves the body's use of the hormone insulin by:  Increasing the body's insulin sensitivity.  Reducing the body's insulin needs.  Decreases the risk for heart disease because exercising:  Lowers cholesterol and triglycerides levels.  Increases the levels of good cholesterol (such as high-density lipoproteins [HDL]) in the body.  Lowers blood glucose levels. YOUR ACTIVITY PLAN  Choose an activity that you enjoy and set realistic goals. Your health care provider or diabetes educator can help you make an activity plan that works for you. Exercise regularly as directed by your health care provider. This includes:  Performing resistance training twice a week such as push-ups, sit-ups, lifting weights, or using resistance bands.  Performing 150 minutes of cardio exercises each week such as walking, running, or playing sports.  Staying active and spending no more than 90 minutes at one time being inactive. Even short bursts of exercise are good for you. Three 10-minute sessions spread throughout the day are just as beneficial as a single 30-minute session. Some exercise ideas include:  Taking the dog for a walk.  Taking the stairs instead of the elevator.  Dancing to your favorite song.  Doing an exercise  video.  Doing your favorite exercise with a friend. RECOMMENDATIONS FOR EXERCISING WITH TYPE 1 OR TYPE 2 DIABETES   Check your blood glucose before exercising. If blood glucose levels are greater than 240 mg/dL, check for urine ketones. Do not exercise if ketones are present.  Avoid injecting insulin into areas of the body that are going to be exercised. For example, avoid injecting insulin into:  The arms when playing tennis.  The legs when jogging.  Keep a record of:  Food intake before and after you exercise.  Expected peak times of insulin action.  Blood glucose levels before and after you exercise.  The type and amount of exercise you have done.  Review your records with your health care provider. Your health care provider will help you to develop guidelines for adjusting food intake and insulin amounts before and after exercising.  If you take insulin or oral hypoglycemic agents, watch for signs and symptoms of hypoglycemia. They include:  Dizziness.  Shaking.  Sweating.  Chills.  Confusion.  Drink plenty of water while you exercise to prevent dehydration or heat stroke. Body water is lost during exercise and must be replaced.  Talk to your health care provider before starting an exercise program to make sure it is safe for you. Remember, almost any type of activity is better than none. Document Released: 07/06/2003 Document Revised: 08/30/2013 Document Reviewed: 09/22/2012 [employer] Patient Information 2015 [employer]. This information is not intended to replace advice given to you by your health care provider. Make sure you discuss any questions you have with your health care provider.  

## 2014-01-12 ENCOUNTER — Other Ambulatory Visit: Payer: Self-pay

## 2014-01-12 ENCOUNTER — Other Ambulatory Visit: Payer: Self-pay | Admitting: Family

## 2014-01-12 DIAGNOSIS — E7800 Pure hypercholesterolemia, unspecified: Secondary | ICD-10-CM

## 2014-01-12 LAB — MICROALBUMIN / CREATININE URINE RATIO
Creatinine,U: 196.1 mg/dL
Microalb Creat Ratio: 5.3 mg/g (ref 0.0–30.0)
Microalb, Ur: 10.3 mg/dL — ABNORMAL HIGH (ref 0.0–1.9)

## 2014-01-12 MED ORDER — SIMVASTATIN 20 MG PO TABS: 20.0000 mg | ORAL_TABLET | Freq: Every day | ORAL | Status: DC

## 2014-02-01 ENCOUNTER — Other Ambulatory Visit: Payer: Self-pay | Admitting: Family

## 2014-02-24 ENCOUNTER — Other Ambulatory Visit: Payer: Commercial Managed Care - PPO

## 2014-04-05 ENCOUNTER — Other Ambulatory Visit: Payer: Self-pay | Admitting: Family

## 2014-04-05 ENCOUNTER — Other Ambulatory Visit (INDEPENDENT_AMBULATORY_CARE_PROVIDER_SITE_OTHER): Payer: Commercial Managed Care - PPO

## 2014-04-05 DIAGNOSIS — IMO0002 Reserved for concepts with insufficient information to code with codable children: Secondary | ICD-10-CM

## 2014-04-05 DIAGNOSIS — Z0000 Encounter for general adult medical examination without abnormal findings: Secondary | ICD-10-CM

## 2014-04-05 DIAGNOSIS — E1165 Type 2 diabetes mellitus with hyperglycemia: Secondary | ICD-10-CM

## 2014-04-05 LAB — CBC WITH DIFFERENTIAL/PLATELET
BASOS PCT: 0.4 % (ref 0.0–3.0)
Basophils Absolute: 0 10*3/uL (ref 0.0–0.1)
EOS PCT: 2.8 % (ref 0.0–5.0)
Eosinophils Absolute: 0.2 10*3/uL (ref 0.0–0.7)
HEMATOCRIT: 41.3 % (ref 39.0–52.0)
Hemoglobin: 13.5 g/dL (ref 13.0–17.0)
LYMPHS ABS: 2.3 10*3/uL (ref 0.7–4.0)
Lymphocytes Relative: 36.9 % (ref 12.0–46.0)
MCHC: 32.8 g/dL (ref 30.0–36.0)
MCV: 91.5 fl (ref 78.0–100.0)
MONO ABS: 0.5 10*3/uL (ref 0.1–1.0)
Monocytes Relative: 7.9 % (ref 3.0–12.0)
Neutro Abs: 3.2 10*3/uL (ref 1.4–7.7)
Neutrophils Relative %: 52 % (ref 43.0–77.0)
Platelets: 308 10*3/uL (ref 150.0–400.0)
RBC: 4.52 Mil/uL (ref 4.22–5.81)
RDW: 13.9 % (ref 11.5–15.5)
WBC: 6.2 10*3/uL (ref 4.0–10.5)

## 2014-04-05 LAB — LIPID PANEL
CHOL/HDL RATIO: 7
Cholesterol: 230 mg/dL — ABNORMAL HIGH (ref 0–200)
HDL: 32.8 mg/dL — AB (ref >39.00)
NonHDL: 197.2
Triglycerides: 205 mg/dL — ABNORMAL HIGH (ref 0.0–149.0)
VLDL: 41 mg/dL — ABNORMAL HIGH (ref 0.0–40.0)

## 2014-04-05 LAB — BASIC METABOLIC PANEL
BUN: 17 mg/dL (ref 6–23)
CO2: 26 mEq/L (ref 19–32)
Calcium: 9.6 mg/dL (ref 8.4–10.5)
Chloride: 100 mEq/L (ref 96–112)
Creatinine, Ser: 1.2 mg/dL (ref 0.4–1.5)
GFR: 66.89 mL/min (ref >60.00)
Glucose, Bld: 173 mg/dL — ABNORMAL HIGH (ref 70–99)
POTASSIUM: 5 meq/L (ref 3.5–5.1)
SODIUM: 135 meq/L (ref 135–145)

## 2014-04-05 LAB — MICROALBUMIN / CREATININE URINE RATIO
CREATININE, U: 244 mg/dL
Microalb Creat Ratio: 4.5 mg/g (ref 0.0–30.0)
Microalb, Ur: 11.1 mg/dL — ABNORMAL HIGH (ref 0.0–1.9)

## 2014-04-05 LAB — HEMOGLOBIN A1C: HEMOGLOBIN A1C: 10.4 % — AB (ref 4.6–6.5)

## 2014-04-05 LAB — HEPATIC FUNCTION PANEL
ALT: 35 U/L (ref 0–53)
AST: 32 U/L (ref 0–37)
Albumin: 4.4 g/dL (ref 3.5–5.2)
Alkaline Phosphatase: 56 U/L (ref 39–117)
Bilirubin, Direct: 0.1 mg/dL (ref 0.0–0.3)
Total Bilirubin: 0.7 mg/dL (ref 0.2–1.2)
Total Protein: 7.3 g/dL (ref 6.0–8.3)

## 2014-04-05 LAB — LDL CHOLESTEROL, DIRECT: LDL DIRECT: 158 mg/dL

## 2014-04-05 LAB — POCT URINALYSIS DIPSTICK
Bilirubin, UA: NEGATIVE
KETONES UA: NEGATIVE
Leukocytes, UA: NEGATIVE
Nitrite, UA: NEGATIVE
RBC UA: NEGATIVE
SPEC GRAV UA: 1.02
UROBILINOGEN UA: 0.2
pH, UA: 5

## 2014-04-05 LAB — TSH: TSH: 2.21 u[IU]/mL (ref 0.35–4.50)

## 2014-04-05 LAB — PSA: PSA: 0.42 ng/mL (ref 0.10–4.00)

## 2014-04-12 ENCOUNTER — Ambulatory Visit (INDEPENDENT_AMBULATORY_CARE_PROVIDER_SITE_OTHER): Payer: Commercial Managed Care - PPO | Admitting: Family

## 2014-04-12 ENCOUNTER — Encounter: Payer: Self-pay | Admitting: Family

## 2014-04-12 ENCOUNTER — Encounter (INDEPENDENT_AMBULATORY_CARE_PROVIDER_SITE_OTHER): Payer: Commercial Managed Care - PPO | Admitting: Family

## 2014-04-12 DIAGNOSIS — E669 Obesity, unspecified: Secondary | ICD-10-CM

## 2014-04-12 DIAGNOSIS — I10 Essential (primary) hypertension: Secondary | ICD-10-CM

## 2014-04-12 DIAGNOSIS — Z91199 Patient's noncompliance with other medical treatment and regimen due to unspecified reason: Secondary | ICD-10-CM | POA: Insufficient documentation

## 2014-04-12 DIAGNOSIS — Z9119 Patient's noncompliance with other medical treatment and regimen: Secondary | ICD-10-CM

## 2014-04-12 NOTE — Progress Notes (Signed)
   Subjective:    Patient ID: Martin [name], male    DOB: [birth year], 68 y.o.   MRN: [id number]  HPI  68 year old white male with a history of uncontrolled Type 2 DM, hypercholesterolemia-uncontrolled, obesity, medication noncompliance. Today, patient was scheduled for CPX but has decided that he does not want to see a NP for his CPX because "they are not qualified to stick their finger in my ass." "There is a special place for nurse practitioners, they should not be treating diabetes and hypertension." He was also concerned that he has been having diarrhea bc I prescribed him Simvastatin. He has since stopped all of his medication. Also very upset that Dr. Arnoldo [name] "just dropped me, my wife, and my son after 30 years."   Review of Systems     Objective:   Physical Exam   NO PE     Assessment & Plan:  Martin [name] was seen today for no specified reason.  Diagnoses and associated orders for this visit:  Essential hypertension  Medically noncompliant  Obesity    I have discussed with patient that I will try to find a provider within out practice that is a MD with whom he may feel comfortable with. I have referred patient to Endocrinology due to diabetes being uncontrolled. However, his issue has truly been medication noncompliance. Endocrinology has been attempting to reach patient unsuccessfully. Martin [name] provided patient with the contact information so that he may schedule an appt asap. Oddly, I have seen patient multiple times in the past. Although noncompliant, he has typically been flat but pleasant overall.

## 2014-04-14 ENCOUNTER — Encounter: Payer: Self-pay | Admitting: Endocrinology

## 2014-04-14 ENCOUNTER — Ambulatory Visit (INDEPENDENT_AMBULATORY_CARE_PROVIDER_SITE_OTHER): Payer: Commercial Managed Care - PPO | Admitting: Endocrinology

## 2014-04-14 VITALS — BP 140/92 | HR 108 | Temp 98.2°F | Ht 72.0 in | Wt 295.0 lb

## 2014-04-14 DIAGNOSIS — R200 Anesthesia of skin: Secondary | ICD-10-CM

## 2014-04-14 LAB — VITAMIN B12: Vitamin B-12: 187 pg/mL — ABNORMAL LOW (ref 211–911)

## 2014-04-14 NOTE — Progress Notes (Signed)
Subjective:    Patient ID: Martin Agee Sr., male    DOB: 19-Jul-1945, 68 y.o.   MRN: [id number]  HPI pt states DM was dx'ed in 2004; he has mild neuropathy of the lower extremities; he is unaware of any associated chronic complications; he has been on insulin since 2013; pt says his diet and exercise are not good; he has never had pancreatitis, severe hypoglycemia or DKA.  He takes multiple daily injections.  He misses approx 2-3 injections per week (mostly at hs).  He says cbg's vary from 70-300.  He denies hypoglycemia.    Past Medical History  Diagnosis Date  . Diabetes mellitus   . Hypertension     Past Surgical History  Procedure Laterality Date  . Hernia repair      History   Social History  . Marital Status: Married    Spouse Name: N/A    Number of Children: N/A  . Years of Education: N/A   Occupational History  . Not on file.   Social History Main Topics  . Smoking status: Former Smoker    Quit date: 04/29/2000  . Smokeless tobacco: Never Used     Comment: smoked for 10 years on and off  . Alcohol Use: No  . Drug Use: No  . Sexual Activity: Yes   Other Topics Concern  . Not on file   Social History Narrative    Current Outpatient Prescriptions on File Prior to Visit  Medication Sig Dispense Refill  . ACCU-CHEK COMPACT TEST DRUM test strip TEST AS DIRECTED 100 each 3  . amitriptyline (ELAVIL) 50 MG tablet TAKE 1 TABLET BY MOUTH AT BEDTIME 30 tablet 10  . glucose blood test strip 100 each by Other route 3 (three) times daily. Use as instructed 100 each 12  . Insulin Glargine (LANTUS SOLOSTAR) 100 UNIT/ML Solostar Pen Inject 50 Units into the skin daily at 10 pm. (Patient taking differently: Inject 80 Units into the skin daily with supper. ) 6 pen 4  . insulin lispro (HUMALOG KWIKPEN) 100 UNIT/ML KiwkPen Inject 0.5 mLs (50 Units total) into the skin 4 (four) times daily. (Patient taking differently: Inject 50 Units into the skin 3 (three) times daily with  meals. ) 8 pen 3  . Insulin Pen Needle (EASY TOUCH PEN NEEDLES) 32G X 6 MM MISC Use 2 times daily 100 each 3  . losartan-hydrochlorothiazide (HYZAAR) 100-12.5 MG per tablet TAKE 1 TABLET BY MOUTH DAILY. 30 tablet 3  . Folic Acid-Vit J2-INO M76 (FOLBEE) 2.5-25-1 MG TABS Take 1 tablet by mouth 2 (two) times daily.      . simvastatin (ZOCOR) 20 MG tablet Take 1 tablet (20 mg total) by mouth at bedtime. (Patient not taking: Reported on 04/14/2014) 30 tablet 4   No current facility-administered medications on file prior to visit.    Allergies  Allergen Reactions  . Simvastatin Diarrhea    Family History  Problem Relation Age of Onset  . Hypertension Mother   . Heart disease Mother   . Diabetes Maternal Grandmother   . Diabetes Son     BP 140/92 mmHg  Pulse 108  Temp(Src) 98.2 F (36.8 C) (Oral)  Ht 6' (1.829 m)  Wt 295 lb (133.811 kg)  BMI 40.00 kg/m2  SpO2 95%  Review of Systems denies blurry vision, headache, chest pain, sob, n/v, urinary frequency, muscle cramps, excessive diaphoresis, depression, and cold intolerance.  He has chronic weight gain, rhinorrhea, easy bruising, acral numbness, and leg cramps.  Objective:   Physical Exam VS: see vs page GEN: no distress.  Morbid obesity.   HEAD: head: no deformity eyes: no periorbital swelling, no proptosis external nose and ears are normal mouth: no lesion seen NECK: supple, thyroid is not enlarged CHEST WALL: no deformity LUNGS: clear to auscultation BREASTS:  bilat pseudogynecomastia.   CV: reg rate and rhythm, no murmur ABD: abdomen is soft, nontender.  no hepatosplenomegaly.  not distended.  no hernia.   MUSCULOSKELETAL: muscle bulk and strength are grossly normal.  no obvious joint swelling.  gait is normal and steady EXTEMITIES: no deformity.  no ulcer on the feet.  feet are of normal color and temp.  Trace bilat leg edema.  PULSES: dorsalis pedis intact bilat.  no carotid bruit NEURO:  cn 2-12 grossly intact.    readily moves all 4's.  sensation is intact to touch on the feet, but decreased from normal.   SKIN:  Normal texture and temperature.  No rash or suspicious lesion is visible.  Spotty hyperpigmentation on the legs and feet.   NODES:  None palpable at the neck.   PSYCH: alert, well-oriented.  Does not appear anxious nor depressed.   Lab Results  Component Value Date   HGBA1C 10.4* 04/05/2014       Assessment & Plan:  DM: severe exacerbation b-12 deficiency: new.  Injections are advised.    Patient is advised the following: Patient Instructions  good diet and exercise habits significanly improve the control of your diabetes.  please let me know if you wish to be referred to a dietician.  high blood sugar is very risky to your health.  you should see an eye doctor and dentist every year.  It is very important to get all recommended vaccinations.  controlling your blood pressure and cholesterol drastically reduces the damage diabetes does to your body.  Those who smoke should quit.  please discuss these with your doctor.  check your blood sugar twice a day.  vary the time of day when you check, between before the 3 meals, and at bedtime.  also check if you have symptoms of your blood sugar being too high or too low.  please keep a record of the readings and bring it to your next appointment here.  You can write it on any piece of paper.  please call us sooner if your blood sugar goes below 70, or if you have a lot of readings over 200. blood tests are being requested for you today.  We'll let you know about the results.  we will need to take this complex situation in stages For now, please take the humalog 50 units 3 times a day (just before each meal), but not at bedtime. Also, please increase the lantus to 80 units daily, and take it at supper. Please come back for a follow-up appointment in 1 month.

## 2014-04-14 NOTE — Patient Instructions (Addendum)
good diet and exercise habits significanly improve the control of your diabetes.  please let me know if you wish to be referred to a dietician.  high blood sugar is very risky to your health.  you should see an eye doctor and dentist every year.  It is very important to get all recommended vaccinations.  controlling your blood pressure and cholesterol drastically reduces the damage diabetes does to your body.  Those who smoke should quit.  please discuss these with your doctor.  check your blood sugar twice a day.  vary the time of day when you check, between before the 3 meals, and at bedtime.  also check if you have symptoms of your blood sugar being too high or too low.  please keep a record of the readings and bring it to your next appointment here.  You can write it on any piece of paper.  please call us sooner if your blood sugar goes below 70, or if you have a lot of readings over 200. blood tests are being requested for you today.  We'll let you know about the results.  we will need to take this complex situation in stages For now, please take the humalog 50 units 3 times a day (just before each meal), but not at bedtime. Also, please increase the lantus to 80 units daily, and take it at supper. Please come back for a follow-up appointment in 1 month.

## 2014-04-19 ENCOUNTER — Ambulatory Visit (INDEPENDENT_AMBULATORY_CARE_PROVIDER_SITE_OTHER): Payer: Commercial Managed Care - PPO

## 2014-04-19 DIAGNOSIS — E538 Deficiency of other specified B group vitamins: Secondary | ICD-10-CM

## 2014-04-19 MED ORDER — CYANOCOBALAMIN 1000 MCG/ML IJ SOLN
1000.0000 ug | Freq: Once | INTRAMUSCULAR | Status: AC
  Administered 2014-04-19: 1000 ug via INTRAMUSCULAR

## 2014-04-26 ENCOUNTER — Other Ambulatory Visit: Payer: Self-pay | Admitting: *Deleted

## 2014-04-26 MED ORDER — INSULIN LISPRO 100 UNIT/ML (KWIKPEN): 50.0000 [IU] | PEN_INJECTOR | Freq: Four times a day (QID) | SUBCUTANEOUS | Status: DC

## 2014-05-17 ENCOUNTER — Encounter: Payer: Self-pay | Admitting: Endocrinology

## 2014-05-17 ENCOUNTER — Ambulatory Visit (INDEPENDENT_AMBULATORY_CARE_PROVIDER_SITE_OTHER): Payer: Commercial Managed Care - PPO | Admitting: Endocrinology

## 2014-05-17 VITALS — BP 130/86 | HR 111 | Temp 97.9°F | Ht 72.0 in | Wt 299.0 lb

## 2014-05-17 DIAGNOSIS — E118 Type 2 diabetes mellitus with unspecified complications: Secondary | ICD-10-CM

## 2014-05-17 DIAGNOSIS — E538 Deficiency of other specified B group vitamins: Secondary | ICD-10-CM

## 2014-05-17 MED ORDER — CYANOCOBALAMIN 1000 MCG/ML IJ SOLN
1000.0000 ug | Freq: Once | INTRAMUSCULAR | Status: AC
  Administered 2014-05-17: 1000 ug via INTRAMUSCULAR

## 2014-05-17 NOTE — Patient Instructions (Addendum)
check your blood sugar twice a day.  vary the time of day when you check, between before the 3 meals, and at bedtime.  also check if you have symptoms of your blood sugar being too high or too low.  please keep a record of the readings and bring it to your next appointment here.  You can write it on any piece of paper.  please call us sooner if your blood sugar goes below 70, or if you have a lot of readings over 200.   we will need to take this complex situation in stages For now, please increase the humalog to 3 times a day (just before each meal), 60-50-50 units.   Also, please continue the lantus, 80 units in the evening.  Please take these insulin amounts, no matter what your blood sugar is.  Please come back for a follow-up appointment in 1 month.

## 2014-05-17 NOTE — Progress Notes (Signed)
Subjective:    Patient ID: Martin Agee Sr., male    DOB: 1946-04-06, 69 y.o.   MRN: [id number]  HPI  Pt returns for f/u of diabetes mellitus: DM type: 1 Dx'ed: 8786 Complications: polyneuropathy Therapy: insulin since 2013 GDM: never DKA: never Severe hypoglycemia: never Pancreatitis: never Other: he takes multiple daily injections Interval history:  he brings a record of his cbg's which i have reviewed today.  It varies from 90-300.  There is no trend throughout the day.  He skips the insulin if cbg is mid-100's or lower.  pt states he feels well in general. Past Medical History  Diagnosis Date  . Diabetes mellitus   . Hypertension     Past Surgical History  Procedure Laterality Date  . Hernia repair      History   Social History  . Marital Status: Married    Spouse Name: N/A    Number of Children: N/A  . Years of Education: N/A   Occupational History  . Not on file.   Social History Main Topics  . Smoking status: Former Smoker    Quit date: 04/29/2000  . Smokeless tobacco: Never Used     Comment: smoked for 10 years on and off  . Alcohol Use: No  . Drug Use: No  . Sexual Activity: Yes   Other Topics Concern  . Not on file   Social History Narrative    Current Outpatient Prescriptions on File Prior to Visit  Medication Sig Dispense Refill  . ACCU-CHEK COMPACT TEST DRUM test strip TEST AS DIRECTED 100 each 3  . amitriptyline (ELAVIL) 50 MG tablet TAKE 1 TABLET BY MOUTH AT BEDTIME 30 tablet 10  . Folic Acid-Vit V6-HMC N47 (FOLBEE) 2.5-25-1 MG TABS Take 1 tablet by mouth 2 (two) times daily.      Marland Kitchen glucose blood test strip 100 each by Other route 3 (three) times daily. Use as instructed 100 each 12  . insulin lispro (HUMALOG KWIKPEN) 100 UNIT/ML KiwkPen Inject 0.5 mLs (50 Units total) into the skin 4 (four) times daily. (Patient taking differently: 3 times a day (just before each meal) 60-50-50 units) 8 pen 0  . Insulin Pen Needle (EASY TOUCH PEN  NEEDLES) 32G X 6 MM MISC Use 2 times daily 100 each 3  . losartan-hydrochlorothiazide (HYZAAR) 100-12.5 MG per tablet TAKE 1 TABLET BY MOUTH DAILY. 30 tablet 3  . simvastatin (ZOCOR) 20 MG tablet Take 1 tablet (20 mg total) by mouth at bedtime. (Patient not taking: Reported on 05/17/2014) 30 tablet 4   No current facility-administered medications on file prior to visit.    Allergies  Allergen Reactions  . Simvastatin Diarrhea    Family History  Problem Relation Age of Onset  . Hypertension Mother   . Heart disease Mother   . Diabetes Maternal Grandmother   . Diabetes Son     BP 130/86 mmHg  Pulse 111  Temp(Src) 97.9 F (36.6 C) (Oral)  Ht 6' (1.829 m)  Wt 299 lb (135.626 kg)  BMI 40.54 kg/m2  SpO2 97%  Review of Systems He denies hypoglycemia, but he has gained weight.     Objective:   Physical Exam VITAL SIGNS:  See vs page GENERAL: no distress Pulses: dorsalis pedis intact bilat.   MSK: no deformity of the feet CV: 1+ bilat leg edema Skin:  no ulcer on the feet.  normal color and temp on the feet. Neuro: sensation is intact to touch on the feet, but  decreased from normal.      Assessment & Plan:  Tachycardia, persistent, ? neuropathic: we'll follow. DM: moderate exacerbation. Noncompliance by hx, much better now.   Patient is advised the following: Patient Instructions  check your blood sugar twice a day.  vary the time of day when you check, between before the 3 meals, and at bedtime.  also check if you have symptoms of your blood sugar being too high or too low.  please keep a record of the readings and bring it to your next appointment here.  You can write it on any piece of paper.  please call us sooner if your blood sugar goes below 70, or if you have a lot of readings over 200.   we will need to take this complex situation in stages For now, please increase the humalog to 3 times a day (just before each meal), 60-50-50 units.   Also, please continue the  lantus, 80 units in the evening.  Please take these insulin amounts, no matter what your blood sugar is.  Please come back for a follow-up appointment in 1 month.

## 2014-05-24 ENCOUNTER — Other Ambulatory Visit: Payer: Self-pay | Admitting: *Deleted

## 2014-05-27 ENCOUNTER — Other Ambulatory Visit: Payer: Self-pay | Admitting: Family

## 2014-05-27 NOTE — Telephone Encounter (Signed)
Pt now seen by Dr. Loanne Drilling

## 2014-05-31 ENCOUNTER — Telehealth: Payer: Self-pay | Admitting: Endocrinology

## 2014-05-31 MED ORDER — INSULIN LISPRO 100 UNIT/ML (KWIKPEN): PEN_INJECTOR | SUBCUTANEOUS | Status: DC

## 2014-05-31 NOTE — Telephone Encounter (Signed)
Rx sent to pt's pharmacy

## 2014-05-31 NOTE — Telephone Encounter (Signed)
Called and lvom advising pt we have not rejected his insulin we do not have a record of a refill request. Refill sent. Advised pt to call back if he had any questions.

## 2014-05-31 NOTE — Telephone Encounter (Signed)
Patient stated that his meds Humalog that was sent in was not the right Quantity, he get 15 pens

## 2014-05-31 NOTE — Telephone Encounter (Signed)
Patient would like to know why his Humalog was rejected. Please, advise

## 2014-06-14 ENCOUNTER — Other Ambulatory Visit: Payer: Self-pay | Admitting: Family

## 2014-06-14 NOTE — Telephone Encounter (Signed)
Ok to fill 

## 2014-06-16 ENCOUNTER — Ambulatory Visit (INDEPENDENT_AMBULATORY_CARE_PROVIDER_SITE_OTHER): Payer: Commercial Managed Care - PPO | Admitting: Endocrinology

## 2014-06-16 ENCOUNTER — Encounter: Payer: Self-pay | Admitting: Endocrinology

## 2014-06-16 VITALS — BP 132/94 | HR 101 | Temp 98.4°F | Wt 307.0 lb

## 2014-06-16 DIAGNOSIS — E538 Deficiency of other specified B group vitamins: Secondary | ICD-10-CM

## 2014-06-16 DIAGNOSIS — E118 Type 2 diabetes mellitus with unspecified complications: Secondary | ICD-10-CM

## 2014-06-16 MED ORDER — CYANOCOBALAMIN 1000 MCG/ML IJ SOLN
1000.0000 ug | Freq: Once | INTRAMUSCULAR | Status: AC
  Administered 2014-06-16: 1000 ug via INTRAMUSCULAR

## 2014-06-16 NOTE — Progress Notes (Signed)
Subjective:    Patient ID: Martin Agee Sr., male    DOB: 1945/07/11, 69 y.o.   MRN: [id number]  HPI Pt returns for f/u of diabetes mellitus: DM type: 1 Dx'ed: 6767 Complications: polyneuropathy Therapy: insulin since 2013. DKA: never Severe hypoglycemia: never Pancreatitis: never Other: he takes multiple daily injections.  Interval history: Since last ov, he has had only 1 episode of hypoglycemia, and this was mild.  This was after he took humalog, but did not eat.  he brings a record of his cbg's which i have reviewed today.  It varies from .  He says he misses only 1 injection per week.   Past Medical History  Diagnosis Date  . Diabetes mellitus   . Hypertension     Past Surgical History  Procedure Laterality Date  . Hernia repair      History   Social History  . Marital Status: Married    Spouse Name: N/A  . Number of Children: N/A  . Years of Education: N/A   Occupational History  . Not on file.   Social History Main Topics  . Smoking status: Former Smoker    Quit date: 04/29/2000  . Smokeless tobacco: Never Used     Comment: smoked for 10 years on and off  . Alcohol Use: No  . Drug Use: No  . Sexual Activity: Yes   Other Topics Concern  . Not on file   Social History Narrative    Current Outpatient Prescriptions on File Prior to Visit  Medication Sig Dispense Refill  . ACCU-CHEK COMPACT TEST DRUM test strip TEST AS DIRECTED 100 each 3  . amitriptyline (ELAVIL) 50 MG tablet TAKE 1 TABLET BY MOUTH AT BEDTIME 30 tablet 10  . Folic Acid-Vit M0-NOB S96 (FOLBEE) 2.5-25-1 MG TABS Take 1 tablet by mouth 2 (two) times daily.      Marland Kitchen glucose blood test strip 100 each by Other route 3 (three) times daily. Use as instructed 100 each 12  . Insulin Glargine (LANTUS) 100 UNIT/ML Solostar Pen Inject 80 Units into the skin at bedtime.    . Insulin Pen Needle (EASY TOUCH PEN NEEDLES) 32G X 6 MM MISC Use 2 times daily 100 each 3  . LANTUS SOLOSTAR 100 UNIT/ML  Solostar Pen INJECT 50 UNITS INTO THE SKIN DAILY AT 10 PM. 15 mL 3  . losartan-hydrochlorothiazide (HYZAAR) 100-12.5 MG per tablet TAKE 1 TABLET BY MOUTH DAILY. 30 tablet 3  . simvastatin (ZOCOR) 20 MG tablet Take 1 tablet (20 mg total) by mouth at bedtime. 30 tablet 4   No current facility-administered medications on file prior to visit.    Allergies  Allergen Reactions  . Simvastatin Diarrhea    Family History  Problem Relation Age of Onset  . Hypertension Mother   . Heart disease Mother   . Diabetes Maternal Grandmother   . Diabetes Son     BP 132/94 mmHg  Pulse 101  Temp(Src) 98.4 F (36.9 C) (Oral)  Wt 307 lb (139.254 kg)  SpO2 91%  Review of Systems Denies LOC and weight change    Objective:   Physical Exam VITAL SIGNS:  See vs page GENERAL: no distress Pulses: dorsalis pedis intact bilat.   MSK: no deformity of the feet CV: 1+ bilat leg edema Skin:  no ulcer on the feet.  normal color and temp on the feet. Neuro: sensation is intact to touch on the feet, but decreased from normal.  Assessment & Plan:  DM: moderate exacerbation Side-effect of rx; hypoglycemia, due to taking humalog, but not eating right away.  Obesity, persistent   Patient is advised the following: Patient Instructions  check your blood sugar twice a day.  vary the time of day when you check, between before the 3 meals, and at bedtime.  also check if you have symptoms of your blood sugar being too high or too low.  please keep a record of the readings and bring it to your next appointment here.  You can write it on any piece of paper.  please call us sooner if your blood sugar goes below 70, or if you have a lot of readings over 200.   For now, please increase the humalog to 3 times a day (just before each meal, and only when you eat), 70-60-50 units.   Also, please continue the lantus, 80 units in the evening.  Please take these insulin amounts, no matter what your blood sugar is.    Please come back for a follow-up appointment in 1 month.   Please see a weight-loss surgery specialist.  you will receive a phone call, about a day and time for an appointment.

## 2014-06-16 NOTE — Patient Instructions (Addendum)
check your blood sugar twice a day.  vary the time of day when you check, between before the 3 meals, and at bedtime.  also check if you have symptoms of your blood sugar being too high or too low.  please keep a record of the readings and bring it to your next appointment here.  You can write it on any piece of paper.  please call us sooner if your blood sugar goes below 70, or if you have a lot of readings over 200.   For now, please increase the humalog to 3 times a day (just before each meal, and only when you eat), 70-60-50 units.   Also, please continue the lantus, 80 units in the evening.  Please take these insulin amounts, no matter what your blood sugar is.  Please come back for a follow-up appointment in 1 month.   Please see a weight-loss surgery specialist.  you will receive a phone call, about a day and time for an appointment.

## 2014-07-18 ENCOUNTER — Other Ambulatory Visit: Payer: Self-pay

## 2014-07-18 MED ORDER — AMITRIPTYLINE HCL 50 MG PO TABS
50.0000 mg | ORAL_TABLET | Freq: Every day | ORAL | Status: DC
Start: 2014-07-18 — End: 2015-01-23

## 2014-07-18 NOTE — Telephone Encounter (Signed)
Rx request for Amitriptyline HCL 50 mg tablet-Take 1 tablet by mouth at bedtime #30  Pharm:  [place]

## 2014-07-19 ENCOUNTER — Ambulatory Visit (INDEPENDENT_AMBULATORY_CARE_PROVIDER_SITE_OTHER): Payer: Commercial Managed Care - PPO | Admitting: Endocrinology

## 2014-07-19 ENCOUNTER — Encounter: Payer: Self-pay | Admitting: Endocrinology

## 2014-07-19 VITALS — BP 138/80 | HR 114 | Temp 97.7°F | Ht 72.0 in | Wt 298.0 lb

## 2014-07-19 DIAGNOSIS — E538 Deficiency of other specified B group vitamins: Secondary | ICD-10-CM

## 2014-07-19 DIAGNOSIS — L600 Ingrowing nail: Secondary | ICD-10-CM

## 2014-07-19 MED ORDER — CYANOCOBALAMIN 1000 MCG/ML IJ SOLN
1000.0000 ug | Freq: Once | INTRAMUSCULAR | Status: AC
  Administered 2014-07-19: 1000 ug via INTRAMUSCULAR

## 2014-07-19 NOTE — Progress Notes (Signed)
Subjective:    Patient ID: Martin Agee Sr., male    DOB: 17-Oct-1945, 69 y.o.   MRN: [id number]  HPI Pt returns for f/u of diabetes mellitus: DM type: 1 Dx'ed: 1700 Complications: polyneuropathy Therapy: insulin since 2013. DKA: never Severe hypoglycemia: never Pancreatitis: never Other: he takes multiple daily injections.  Interval history: he brings a record of his cbg's which i have reviewed today.  It varies from 121-300.  It is highest in am, but he does not check at hs. Past Medical History  Diagnosis Date  . Diabetes mellitus   . Hypertension     Past Surgical History  Procedure Laterality Date  . Hernia repair      History   Social History  . Marital Status: Married    Spouse Name: N/A  . Number of Children: N/A  . Years of Education: N/A   Occupational History  . Not on file.   Social History Main Topics  . Smoking status: Former Smoker    Quit date: 04/29/2000  . Smokeless tobacco: Never Used     Comment: smoked for 10 years on and off  . Alcohol Use: No  . Drug Use: No  . Sexual Activity: Yes   Other Topics Concern  . Not on file   Social History Narrative    Current Outpatient Prescriptions on File Prior to Visit  Medication Sig Dispense Refill  . ACCU-CHEK COMPACT TEST DRUM test strip TEST AS DIRECTED 100 each 3  . amitriptyline (ELAVIL) 50 MG tablet Take 1 tablet (50 mg total) by mouth at bedtime. 30 tablet 5  . Folic Acid-Vit F7-CBS W96 (FOLBEE) 2.5-25-1 MG TABS Take 1 tablet by mouth 2 (two) times daily.      Marland Kitchen glucose blood test strip 100 each by Other route 3 (three) times daily. Use as instructed 100 each 12  . Insulin Glargine (LANTUS) 100 UNIT/ML Solostar Pen Inject 90 Units into the skin at bedtime.     . insulin lispro (HUMALOG) 100 UNIT/ML KiwkPen Inject into the skin. 3 times a day (just before each meal) 70-60-70 units.    . Insulin Pen Needle (EASY TOUCH PEN NEEDLES) 32G X 6 MM MISC Use 2 times daily 100 each 3  .  losartan-hydrochlorothiazide (HYZAAR) 100-12.5 MG per tablet TAKE 1 TABLET BY MOUTH DAILY. 30 tablet 3  . simvastatin (ZOCOR) 20 MG tablet Take 1 tablet (20 mg total) by mouth at bedtime. 30 tablet 4   No current facility-administered medications on file prior to visit.    Allergies  Allergen Reactions  . Simvastatin Diarrhea    Family History  Problem Relation Age of Onset  . Hypertension Mother   . Heart disease Mother   . Diabetes Maternal Grandmother   . Diabetes Son     BP 138/80 mmHg  Pulse 114  Temp(Src) 97.7 F (36.5 C) (Oral)  Ht 6' (1.829 m)  Wt 298 lb (135.172 kg)  BMI 40.41 kg/m2  SpO2 94%    Review of Systems He denies hypoglycemia and weight change    Objective:   Physical Exam VITAL SIGNS:  See vs page GENERAL: no distress Pulses: dorsalis pedis intact bilat.   MSK: no deformity of the feet  CV: trace bilat leg edema.  Skin: no ulcer on the feet. normal color and temp on the feet, except the left great toe is cyanotic (pt says chronic).    Neuro: sensation is intact to touch on the feet, but decreased from normal.  Ext: the right great toenail is ingrown.      Assessment & Plan:  DM: he needs increased rx Ingrown toenail, new Obesity, persistent  Patient is advised the following: Patient Instructions  check your blood sugar twice a day.  vary the time of day when you check, between before the 3 meals, and at bedtime.  also check if you have symptoms of your blood sugar being too high or too low.  please keep a record of the readings and bring it to your next appointment here.  You can write it on any piece of paper.  please call us sooner if your blood sugar goes below 70, or if you have a lot of readings over 200.  It is especially important to check at bedtime.   For now, please increase the humalog to 3 times a day (just before each meal, and only when you eat), 70-60-70 units.   Also, please increase the lantus to 90 units in the evening.    Please take these insulin amounts, no matter what your blood sugar is.  Please come back for a follow-up appointment in 1 month.   Please continue to pursue the weight-loss surgery.  Here is some information, and a phone number to call.  Please see a podiatry specialist.  you will receive a phone call, about a day and time for an appointment.

## 2014-07-19 NOTE — Patient Instructions (Addendum)
check your blood sugar twice a day.  vary the time of day when you check, between before the 3 meals, and at bedtime.  also check if you have symptoms of your blood sugar being too high or too low.  please keep a record of the readings and bring it to your next appointment here.  You can write it on any piece of paper.  please call us sooner if your blood sugar goes below 70, or if you have a lot of readings over 200.  It is especially important to check at bedtime.   For now, please increase the humalog to 3 times a day (just before each meal, and only when you eat), 70-60-70 units.   Also, please increase the lantus to 90 units in the evening.  Please take these insulin amounts, no matter what your blood sugar is.  Please come back for a follow-up appointment in 1 month.   Please continue to pursue the weight-loss surgery.  Here is some information, and a phone number to call.  Please see a podiatry specialist.  you will receive a phone call, about a day and time for an appointment.

## 2014-08-16 ENCOUNTER — Ambulatory Visit: Payer: Commercial Managed Care - PPO | Admitting: Endocrinology

## 2014-08-17 ENCOUNTER — Ambulatory Visit: Payer: Commercial Managed Care - PPO | Admitting: Podiatry

## 2014-08-24 ENCOUNTER — Ambulatory Visit (INDEPENDENT_AMBULATORY_CARE_PROVIDER_SITE_OTHER): Payer: Commercial Managed Care - PPO | Admitting: Family Medicine

## 2014-08-24 ENCOUNTER — Ambulatory Visit (INDEPENDENT_AMBULATORY_CARE_PROVIDER_SITE_OTHER): Payer: Commercial Managed Care - PPO | Admitting: Endocrinology

## 2014-08-24 ENCOUNTER — Encounter: Payer: Self-pay | Admitting: Endocrinology

## 2014-08-24 ENCOUNTER — Encounter: Payer: Self-pay | Admitting: Family Medicine

## 2014-08-24 VITALS — BP 122/78 | HR 108 | Temp 97.5°F | Wt 303.0 lb

## 2014-08-24 VITALS — BP 134/84 | HR 118 | Temp 98.1°F | Wt 299.0 lb

## 2014-08-24 DIAGNOSIS — E1169 Type 2 diabetes mellitus with other specified complication: Secondary | ICD-10-CM | POA: Insufficient documentation

## 2014-08-24 DIAGNOSIS — E118 Type 2 diabetes mellitus with unspecified complications: Secondary | ICD-10-CM

## 2014-08-24 DIAGNOSIS — Z23 Encounter for immunization: Secondary | ICD-10-CM

## 2014-08-24 DIAGNOSIS — E785 Hyperlipidemia, unspecified: Secondary | ICD-10-CM | POA: Diagnosis not present

## 2014-08-24 DIAGNOSIS — E538 Deficiency of other specified B group vitamins: Secondary | ICD-10-CM | POA: Diagnosis not present

## 2014-08-24 DIAGNOSIS — I10 Essential (primary) hypertension: Secondary | ICD-10-CM

## 2014-08-24 DIAGNOSIS — E1142 Type 2 diabetes mellitus with diabetic polyneuropathy: Secondary | ICD-10-CM | POA: Diagnosis not present

## 2014-08-24 DIAGNOSIS — Z87891 Personal history of nicotine dependence: Secondary | ICD-10-CM | POA: Insufficient documentation

## 2014-08-24 LAB — HEMOGLOBIN A1C: Hgb A1c MFr Bld: 9.6 % — ABNORMAL HIGH (ref 4.6–6.5)

## 2014-08-24 MED ORDER — INSULIN GLARGINE 100 UNIT/ML SOLOSTAR PEN: 300.0000 [IU] | PEN_INJECTOR | SUBCUTANEOUS | Status: DC

## 2014-08-24 MED ORDER — CYANOCOBALAMIN 1000 MCG/ML IJ SOLN
1000.0000 ug | Freq: Once | INTRAMUSCULAR | Status: AC
  Administered 2014-08-24: 1000 ug via INTRAMUSCULAR

## 2014-08-24 MED ORDER — ROSUVASTATIN CALCIUM 5 MG PO TABS: 5.0000 mg | ORAL_TABLET | Freq: Every day | ORAL | Status: DC

## 2014-08-24 NOTE — Progress Notes (Signed)
Subjective:    Patient ID: Martin Agee Sr., male    DOB: March 05, 1946, 69 y.o.   MRN: [id number]  HPI Pt returns for f/u of diabetes mellitus: DM type: 1 Dx'ed: 0737 Complications: polyneuropathy Therapy: insulin since 2013. DKA: never Severe hypoglycemia: never Pancreatitis: never Other: he takes multiple daily injections.  Interval history: he brings a record of his cbg's which i have reviewed today.  Pt says he is missing the insulin injections.  It varies from 116-340.  There is no trend throughout the day.  He checks at breakfast and supper only.   Past Medical History  Diagnosis Date  . Diabetes mellitus   . Hypertension     Past Surgical History  Procedure Laterality Date  . Hernia repair      >10 years ago  . Right shoulder surgery      around 2014    History   Social History  . Marital Status: Married    Spouse Name: N/A  . Number of Children: N/A  . Years of Education: N/A   Occupational History  . Not on file.   Social History Main Topics  . Smoking status: Former Smoker    Quit date: 04/29/2000  . Smokeless tobacco: Never Used     Comment: smoked for 10 years on and off  . Alcohol Use: 0.0 oz/week    0 Standard drinks or equivalent per week     Comment: 6 martinis a year  . Drug Use: No  . Sexual Activity: Yes   Other Topics Concern  . Not on file   Social History Narrative   Married. 3 kids. 7 grandkids. No greatgrandkids.       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 ACCU-CHEK COMPACT TEST DRUM test strip TEST AS DIRECTED 100 each 3  . amitriptyline (ELAVIL) 50 MG tablet Take 1 tablet (50 mg total) by mouth at bedtime. 30 tablet 5  . Folic Acid-Vit T0-GYI R48 (FOLBEE) 2.5-25-1 MG TABS Take 1 tablet by mouth 2 (two) times daily.      Marland Kitchen glucose blood test strip 100 each by  Other route 3 (three) times daily. Use as instructed 100 each 12  . Insulin Pen Needle (EASY TOUCH PEN NEEDLES) 32G X 6 MM MISC Use 2 times daily 100 each 3  . losartan-hydrochlorothiazide (HYZAAR) 100-12.5 MG per tablet TAKE 1 TABLET BY MOUTH DAILY. 30 tablet 3  . rosuvastatin (CRESTOR) 5 MG tablet Take 1 tablet (5 mg total) by mouth daily. 30 tablet 5   No current facility-administered medications on file prior to visit.    Allergies  Allergen Reactions  . Simvastatin Diarrhea    Family History  Problem Relation Age of Onset  . Hypertension Mother   . Heart disease Mother     CHF did not see doctor  . Diabetes Maternal Grandmother   . Diabetes Son     BP 134/84 mmHg  Pulse 118  Temp(Src) 98.1 F (36.7 C) (Oral)  Wt 299 lb (135.626 kg)  SpO2 92%  Review of Systems He denies hypoglycemia and weight change.    Objective:   Physical Exam VITAL SIGNS:  See vs page. GENERAL: no distress. Pulses: dorsalis pedis intact bilat.   MSK: no deformity of the feet CV: no leg edema Skin:  no ulcer  on the feet.  normal color and temp on the feet. Neuro: sensation is intact to touch on the feet, but decreased from normal.         Assessment & Plan:  Noncompliance with cbg checking: in this context, he should take a simpler regimen. Obesity: persistent.  Patient is advised the following: Patient Instructions  check your blood sugar twice a day.  vary the time of day when you check, between before the 3 meals, and at bedtime.  also check if you have symptoms of your blood sugar being too high or too low.  please keep a record of the readings and bring it to your next appointment here.  You can write it on any piece of paper.  please call us sooner if your blood sugar goes below 70, or if you have a lot of readings over 200.  It is especially important to check at bedtime.   Please stop taking the humalog, and:  Also, increase the lantus to 300 units each morning.    Please  continue to pursue the weight-loss surgery.  This is the best treatment for your diabetes.   Please come back for a follow-up appointment in 1 month.

## 2014-08-24 NOTE — Patient Instructions (Addendum)
check your blood sugar twice a day.  vary the time of day when you check, between before the 3 meals, and at bedtime.  also check if you have symptoms of your blood sugar being too high or too low.  please keep a record of the readings and bring it to your next appointment here.  You can write it on any piece of paper.  please call us sooner if your blood sugar goes below 70, or if you have a lot of readings over 200.  It is especially important to check at bedtime.   Please stop taking the humalog, and:  Also, increase the lantus to 300 units each morning.    Please continue to pursue the weight-loss surgery.  This is the best treatment for your diabetes.   Please come back for a follow-up appointment in 1 month.

## 2014-08-24 NOTE — Assessment & Plan Note (Signed)
Poor control, start statin with crestor 5mg 

## 2014-08-24 NOTE — Assessment & Plan Note (Signed)
Controlled reasonably on Amitriptyline 50mg , continue current rx

## 2014-08-24 NOTE — Patient Instructions (Addendum)
Received final pneumonia shot ([id number]).  Have eye exam faxed to [place] at [phone].   Crestor 5mg  for cholesterol.   Would encourage walking regularly, weight loss  BP looks good  Tetanus shot today, insurance should cover as medicare not listed in [employer], may get bill, would expect under 100 but not sure  Check in 6 months from now  Multiple precancerous spots on head, please get into dermatology as soon as possible. Call us if you need assistance like a referral.

## 2014-08-24 NOTE — Assessment & Plan Note (Signed)
Controlled on Losartan-hctz 100-12.5mg  and continue

## 2014-08-24 NOTE — Progress Notes (Signed)
Martin Reddish, MD Phone: [phone]  Subjective:  Patient presents today to establish care with me as their new primary care provider. Patient was formerly a patient of Dr. Arnoldo Morale. Chief complaint-noted.   Hyperlipidemia- poor control on no statin as had diarrhea on simvastatin and had isues on lipitor that he cannot recall specifics, willing to trial crestor Lab Results  Component Value Date   CHOL 230* 04/05/2014   HDL 32.80* 04/05/2014   LDLDIRECT 158.0 04/05/2014   TRIG 205.0* 04/05/2014   CHOLHDL 7 04/05/2014   On statin: to start Regular exercise: no States does not want to exercise and does not believe in need for weight loss Diet: poor, overeats ROS- no chest pain or shortness of breath. No myalgias  Hypertension-controlled  BP Readings from Last 3 Encounters:  08/24/14 122/78  08/24/14 134/84  07/19/14 138/80   Home BP monitoring-no Compliant with medications-yes without side effects ROS-Denies any CP, HA, SOB, blurry vision, LE edema   Diabetic polyneuropathy -reasonable control on amitriptyline and a rub he has at home ROS- denies falls, open lesions on feet  The following were reviewed and entered/updated in epic: Past Medical History  Diagnosis Date  . Diabetes mellitus   . Hypertension    Patient Active Problem List   Diagnosis Date Noted  . DM (diabetes mellitus) with complications 09/81/1914    Priority: High  . Hyperlipidemia 08/24/2014    Priority: Medium  . Diabetic polyneuropathy 04/11/2009    Priority: Medium  . Essential hypertension 02/03/2007    Priority: Medium  . Former smoker 08/24/2014    Priority: Low  . Ingrowing toenail 07/19/2014    Priority: Low  . Numbness 04/14/2014    Priority: Low  . Obesity 04/12/2014    Priority: Low  . BACK PAIN, LUMBAR, WITH RADICULOPATHY 08/30/2008    Priority: Low   Past Surgical History  Procedure Laterality Date  . Hernia repair      >10 years ago  . Right shoulder surgery      around  2014    Family History  Problem Relation Age of Onset  . Hypertension Mother   . Heart disease Mother     CHF did not see doctor  . Diabetes Maternal Grandmother   . Diabetes Son     Medications- reviewed and updated Current Outpatient Prescriptions  Medication Sig Dispense Refill  . ACCU-CHEK COMPACT TEST DRUM test strip TEST AS DIRECTED 100 each 3  . amitriptyline (ELAVIL) 50 MG tablet Take 1 tablet (50 mg total) by mouth at bedtime. 30 tablet 5  . Folic Acid-Vit N8-GNF A21 (FOLBEE) 2.5-25-1 MG TABS Take 1 tablet by mouth 2 (two) times daily.      Marland Kitchen glucose blood test strip 100 each by Other route 3 (three) times daily. Use as instructed 100 each 12  . Insulin Glargine (LANTUS) 100 UNIT/ML Solostar Pen Inject 300 Units into the skin every morning. And pen needles 4/day 105 mL 11  . Insulin Pen Needle (EASY TOUCH PEN NEEDLES) 32G X 6 MM MISC Use 2 times daily 100 each 3  . losartan-hydrochlorothiazide (HYZAAR) 100-12.5 MG per tablet TAKE 1 TABLET BY MOUTH DAILY. 30 tablet 3   Allergies-reviewed and updated Allergies  Allergen Reactions  . Simvastatin Diarrhea    History   Social History  . Marital Status: Married    Spouse Name: N/A  . Number of Children: N/A  . Years of Education: N/A   Social History Main Topics  . Smoking status: Former  Smoker    Quit date: 04/29/2000  . Smokeless tobacco: Never Used     Comment: smoked for 10 years on and off  . Alcohol Use: 0.0 oz/week    0 Standard drinks or equivalent per week     Comment: 6 martinis a year  . Drug Use: No  . Sexual Activity: Yes   Other Topics Concern  . Not on file   Social History Narrative   Married. 3 kids. 7 grandkids. No greatgrandkids.       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 No rebound or guarding. obese Ext: trace edema Skin: warm, dry, no rash Neuro: grossly normal, moves all extremities, PERRLA  Assessment/Plan:  Hyperlipidemia Poor control, start statin with crestor 5mg    Essential hypertension Controlled on Losartan-hctz 100-12.5mg  and continue   Diabetic polyneuropathy Controlled reasonably on Amitriptyline 50mg , continue current rx   Instructed on need for diet/weight loss/exercise but patient refuses.  6 months. See AVS.   Orders Placed This Encounter  Procedures  . Pneumococcal conjugate vaccine 13-valent  . Tdap vaccine greater than or equal to 7yo IM    Meds ordered this encounter  Medications  . rosuvastatin (CRESTOR) 5 MG tablet    Sig: Take 1 tablet (5 mg total) by mouth daily.    Dispense:  30 tablet    Refill:  5

## 2014-08-28 ENCOUNTER — Other Ambulatory Visit: Payer: Self-pay | Admitting: Endocrinology

## 2014-08-29 NOTE — Telephone Encounter (Signed)
Please advise if ok to refill. Rx is not listed on current medication list.  thanks!

## 2014-09-22 ENCOUNTER — Ambulatory Visit (INDEPENDENT_AMBULATORY_CARE_PROVIDER_SITE_OTHER): Payer: Commercial Managed Care - PPO | Admitting: Endocrinology

## 2014-09-22 ENCOUNTER — Encounter: Payer: Self-pay | Admitting: Endocrinology

## 2014-09-22 ENCOUNTER — Encounter: Payer: Commercial Managed Care - PPO | Attending: Endocrinology | Admitting: Dietician

## 2014-09-22 ENCOUNTER — Encounter: Payer: Self-pay | Admitting: Dietician

## 2014-09-22 VITALS — BP 127/88 | HR 90 | Temp 98.4°F | Ht 72.0 in | Wt 314.0 lb

## 2014-09-22 VITALS — Ht 72.0 in | Wt 314.0 lb

## 2014-09-22 DIAGNOSIS — Z713 Dietary counseling and surveillance: Secondary | ICD-10-CM | POA: Diagnosis not present

## 2014-09-22 DIAGNOSIS — Z794 Long term (current) use of insulin: Secondary | ICD-10-CM | POA: Diagnosis not present

## 2014-09-22 DIAGNOSIS — E118 Type 2 diabetes mellitus with unspecified complications: Secondary | ICD-10-CM | POA: Diagnosis present

## 2014-09-22 DIAGNOSIS — E669 Obesity, unspecified: Secondary | ICD-10-CM

## 2014-09-22 DIAGNOSIS — Z6841 Body Mass Index (BMI) 40.0 and over, adult: Secondary | ICD-10-CM | POA: Diagnosis not present

## 2014-09-22 MED ORDER — INSULIN GLARGINE 100 UNIT/ML SOLOSTAR PEN: 260.0000 [IU] | PEN_INJECTOR | SUBCUTANEOUS | Status: DC

## 2014-09-22 NOTE — Progress Notes (Signed)
Subjective:    Patient ID: Martin Agee Sr., male    DOB: 04/27/1946, 69 y.o.   MRN: [id number]  HPI Pt returns for f/u of diabetes mellitus: DM type: 1 Dx'ed: 9735 Complications: polyneuropathy Therapy: insulin since 2013. DKA: never Severe hypoglycemia: never Pancreatitis: never Other: he takes multiple daily injections.  Interval history: he brings a record of his cbg's which i have reviewed today.  It varies from 67-300.  It is in general higher as the day goes on.  pt states he feels well in general. Past Medical History  Diagnosis Date  . Diabetes mellitus   . Hypertension     Past Surgical History  Procedure Laterality Date  . Hernia repair      >10 years ago  . Right shoulder surgery      around 2014    History   Social History  . Marital Status: Married    Spouse Name: N/A  . Number of Children: N/A  . Years of Education: N/A   Occupational History  . Not on file.   Social History Main Topics  . Smoking status: Former Smoker    Quit date: 04/29/2000  . Smokeless tobacco: Never Used     Comment: smoked for 10 years on and off  . Alcohol Use: 0.0 oz/week    0 Standard drinks or equivalent per week     Comment: 6 martinis a year  . Drug Use: No  . Sexual Activity: Yes   Other Topics Concern  . Not on file   Social History Narrative   Married. 3 kids. 7 grandkids. No greatgrandkids.       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 ACCU-CHEK COMPACT TEST DRUM test strip TEST AS DIRECTED 100 each 3  . amitriptyline (ELAVIL) 50 MG tablet Take 1 tablet (50 mg total) by mouth at bedtime. 30 tablet 5  . Folic Acid-Vit H2-DJM E26 (FOLBEE) 2.5-25-1 MG TABS Take 1 tablet by mouth 2 (two) times daily.      Marland Kitchen glucose blood test strip 100 each by Other route 3 (three) times daily. Use as  instructed 100 each 12  . Insulin Pen Needle (EASY TOUCH PEN NEEDLES) 32G X 6 MM MISC Use 2 times daily 100 each 3  . losartan-hydrochlorothiazide (HYZAAR) 100-12.5 MG per tablet TAKE 1 TABLET BY MOUTH DAILY. 30 tablet 3  . rosuvastatin (CRESTOR) 5 MG tablet Take 1 tablet (5 mg total) by mouth daily. 30 tablet 5   No current facility-administered medications on file prior to visit.    Allergies  Allergen Reactions  . Simvastatin Diarrhea    Family History  Problem Relation Age of Onset  . Hypertension Mother   . Heart disease Mother     CHF did not see doctor  . Diabetes Maternal Grandmother   . Diabetes Son     BP 127/88 mmHg  Pulse 90  Temp(Src) 98.4 F (36.9 C) (Oral)  Ht 6' (1.829 m)  Wt 314 lb (142.429 kg)  BMI 42.58 kg/m2  SpO2 97%  Review of Systems He has weight gain    Objective:   Physical Exam VITAL SIGNS:  See vs page GENERAL: no distress Pulses: dorsalis pedis intact bilat.   MSK: no deformity of the feet CV: 1+ bilat leg edema Skin:  no ulcer  on the feet.  normal color and temp on the feet. Neuro: sensation is intact to touch on the feet, but decreased from normal   (I discussed with Antonieta Iba, RD, who is seeing pt today)  Lab Results  Component Value Date   HGBA1C 9.6* 08/24/2014      Assessment & Plan:  DM: due to mild hypoglycemia, we need to reduce the lantus.  (pt declines to add qd humalog with largest meal). Weight gain: pt is ref to dietician today.   Patient is advised the following: Patient Instructions  check your blood sugar twice a day.  vary the time of day when you check, between before the 3 meals, and at bedtime.  also check if you have symptoms of your blood sugar being too high or too low.  please keep a record of the readings and bring it to your next appointment here.  You can write it on any piece of paper.  please call us sooner if your blood sugar goes below 70, or if you have a lot of readings over 200.  It is  especially important to check at bedtime.   Also, reduce the lantus to 260 units each morning.    Please consider having weight loss surgery.  It is good for your health.  Here is some information about it.  If you decide to consider further, please call the phone number in the papers, and register for a free informational meeting.   Please come back for a follow-up appointment in 2 months.

## 2014-09-22 NOTE — Patient Instructions (Addendum)
check your blood sugar twice a day.  vary the time of day when you check, between before the 3 meals, and at bedtime.  also check if you have symptoms of your blood sugar being too high or too low.  please keep a record of the readings and bring it to your next appointment here.  You can write it on any piece of paper.  please call us sooner if your blood sugar goes below 70, or if you have a lot of readings over 200.  It is especially important to check at bedtime.   Also, reduce the lantus to 260 units each morning.    Please consider having weight loss surgery.  It is good for your health.  Here is some information about it.  If you decide to consider further, please call the phone number in the papers, and register for a free informational meeting.   Please come back for a follow-up appointment in 2 months.

## 2014-09-22 NOTE — Patient Instructions (Signed)
Try to be active every day. Consider going to the [facility]  (Silver sneakers program) or Pool exercise. Be mindful of choices when you eat out.  Consider splitting meals with wife. Choose baked, broiled, grilled rather than fried. Eat more non starchy vegetables.

## 2014-09-22 NOTE — Progress Notes (Signed)
  Medical Nutrition Therapy:  Appt start time: 0945 end time:  1045.  Assessment:  Primary concerns today: Patient is a walk in today and would like to learn more about nutrition to better improve blood sugar control.  Hx includes Type 2 Diabetes since 2010.  Reports frustration due to weight gain of 14 lbs in the last month since insulin was changed.  UBW 270 lbs until dx of diabetes then new UBW of 299 lbs with increase to 314 lbs today.   HgbA1C of 9.6% 08/24/14 decreased from 10.4% 04/05/14.  He has neuropathy and is now concered with vision problems.  CBG's have improved since insulin change- am 67-197 and 2 hours after lunch 86-250.  He is thinking about Bariatric Surgery.  Patient lives with wife.  He is retired.  Wife works.  She does not cook much. They eat out 1-2 times per day.  Preferred Learning Style:   No preference indicated   Learning Readiness:   Not ready  Contemplating  Ready  Change in progress   MEDICATIONS: see list to include lantus 260 units each morning.   DIETARY INTAKE: 24-hr recall:  B ( AM): coffee with half and half, special K with fruit with 2% milk OR oatmeal with fruit with 2% milk, 2 slices white toast with 1 pat of butter, Truvia Snk ( AM): none  L ( PM): Out to eat:  [employer] -salad OR BBQ sandwich OR hamburger at [employer] with fries Or hamburger [employer] or fries OR home:  Ham and cheese sandwich with diet coke Snk ( PM): none D (5-7 PM): K & W:  veges and chopped steak or baked spaghetti or Home:  Ham and cheese sandwich or frozen pot pie or [employer]:  Chicken filet biscuit with french fries or [place] with brown rice Snk ( PM): none or pretzels Beverages: coffee with half and half, 1 diet coke, water  Usual physical activity: none or yard work.  Complains of problems with Neuropathy.  Estimated energy needs: 1600 calories 180 g carbohydrates 100 g protein 53 g fat  Progress Towards Goal(s):  In progress.   Nutritional Diagnosis:   NB-1.1 Food and nutrition-related knowledge deficit As related to balance of carbohydrate, protein, and fat.  As evidenced by diet hx.    Intervention:  Nutrition counseling and diabetes education initiated. Discussed benefits of increased exercise, balanced meals based on my plate and basic carb counting.  Discussed healthy choices when eating out and portion control as well as easy options to cook at home.  Discussed the Rule of 15 for treatment of hypoglycemia.  Try to be active every day. Consider going to the [facility]  (Silver sneakers program) or Pool exercise. Be mindful of choices when you eat out.  Consider splitting meals with wife. Choose baked, broiled, grilled rather than fried. Eat more non starchy vegetables.  Teaching Method Utilized:  Visual Auditory Hands on  Handouts given during visit include:  My plate  Meal plan card  Resources for healthy nutrition  HgbA1C sheet  Hypoglycemia  Barriers to learning/adherence to lifestyle change: increased choice to eat out  Demonstrated degree of understanding via:  Teach Back   Monitoring/Evaluation:  Dietary intake, exercise, and body weight in 6 weeks.  Invited patient to brink his wife.

## 2014-10-10 ENCOUNTER — Encounter (INDEPENDENT_AMBULATORY_CARE_PROVIDER_SITE_OTHER): Payer: Commercial Managed Care - PPO | Admitting: Ophthalmology

## 2014-10-10 DIAGNOSIS — E11311 Type 2 diabetes mellitus with unspecified diabetic retinopathy with macular edema: Secondary | ICD-10-CM | POA: Diagnosis not present

## 2014-10-10 DIAGNOSIS — E11351 Type 2 diabetes mellitus with proliferative diabetic retinopathy with macular edema: Secondary | ICD-10-CM | POA: Diagnosis not present

## 2014-10-10 DIAGNOSIS — I10 Essential (primary) hypertension: Secondary | ICD-10-CM

## 2014-10-10 DIAGNOSIS — E11341 Type 2 diabetes mellitus with severe nonproliferative diabetic retinopathy with macular edema: Secondary | ICD-10-CM

## 2014-10-10 DIAGNOSIS — H35033 Hypertensive retinopathy, bilateral: Secondary | ICD-10-CM | POA: Diagnosis not present

## 2014-10-10 DIAGNOSIS — H43813 Vitreous degeneration, bilateral: Secondary | ICD-10-CM | POA: Diagnosis not present

## 2014-10-18 ENCOUNTER — Other Ambulatory Visit: Payer: Self-pay | Admitting: Family

## 2014-10-28 ENCOUNTER — Encounter (INDEPENDENT_AMBULATORY_CARE_PROVIDER_SITE_OTHER): Payer: Commercial Managed Care - PPO | Admitting: Ophthalmology

## 2014-10-28 DIAGNOSIS — H35033 Hypertensive retinopathy, bilateral: Secondary | ICD-10-CM

## 2014-10-28 DIAGNOSIS — H43813 Vitreous degeneration, bilateral: Secondary | ICD-10-CM | POA: Diagnosis not present

## 2014-10-28 DIAGNOSIS — I10 Essential (primary) hypertension: Secondary | ICD-10-CM

## 2014-10-28 DIAGNOSIS — E11331 Type 2 diabetes mellitus with moderate nonproliferative diabetic retinopathy with macular edema: Secondary | ICD-10-CM

## 2014-10-28 DIAGNOSIS — H2513 Age-related nuclear cataract, bilateral: Secondary | ICD-10-CM

## 2014-10-28 DIAGNOSIS — E11311 Type 2 diabetes mellitus with unspecified diabetic retinopathy with macular edema: Secondary | ICD-10-CM

## 2014-11-08 ENCOUNTER — Encounter: Payer: Commercial Managed Care - PPO | Attending: Endocrinology | Admitting: Dietician

## 2014-11-08 VITALS — Wt 309.0 lb

## 2014-11-08 DIAGNOSIS — E669 Obesity, unspecified: Secondary | ICD-10-CM | POA: Diagnosis not present

## 2014-11-08 DIAGNOSIS — Z6841 Body Mass Index (BMI) 40.0 and over, adult: Secondary | ICD-10-CM | POA: Diagnosis not present

## 2014-11-08 DIAGNOSIS — Z794 Long term (current) use of insulin: Secondary | ICD-10-CM | POA: Diagnosis not present

## 2014-11-08 DIAGNOSIS — E118 Type 2 diabetes mellitus with unspecified complications: Secondary | ICD-10-CM | POA: Insufficient documentation

## 2014-11-08 DIAGNOSIS — Z713 Dietary counseling and surveillance: Secondary | ICD-10-CM | POA: Diagnosis not present

## 2014-11-08 NOTE — Patient Instructions (Signed)
Continue to be as active as possible. Be mindful about the choices that you are making when you are eating out. Choose baked, broiled, grilled rather than fried Eat more non starchy vegetables. Be aware of your fullness and appetite. Good job in the changes that you have made.

## 2014-11-08 NOTE — Progress Notes (Signed)
Medical Nutrition Therapy:  Appt start time: 0800 end time:  0830.  Assessment:  09/22/14 Primary concerns today: Patient is a walk in today and would like to learn more about nutrition to better improve blood sugar control.  Hx includes Type 2 Diabetes since 2010.  Reports frustration due to weight gain of 14 lbs in the last month since insulin was changed.  UBW 270 lbs until dx of diabetes then new UBW of 299 lbs with increase to 314 lbs today.   HgbA1C of 9.6% 08/24/14 decreased from 10.4% 04/05/14.  He has neuropathy and is now concered with vision problems.  CBG's have improved since insulin change- am 67-197 and 2 hours after lunch 86-250.  He is thinking about Bariatric Surgery.  Patient lives with wife.  He is retired.  Wife works.  She does not cook much. They eat out 1-2 times per day.  They travel back and forth between [place] and [place] due to wife's work.  11/08/14: Patient is here today with his wife.  He states that he has been decreasing poortion sizes and amounts of Carbs overall.  He has lost from 314 lbs to 309 lbs in the last 6 weeks.  He reports average blood sugar on his meter is 156.  This morning CBG 162.  Reports that he is having problems with muscle pain and weekness and is concerned about Crestor.  He has had shots in his eyes due to unknown problem with edema.  He is walking 2-3 times per week.  Noted hx of low Vitamin B-12.  Encouraged patient to continue monthly B-12 shots.  Preferred Learning Style:   No preference indicated   Learning Readiness:   Not ready  Contemplating  Ready  Change in progress   MEDICATIONS: see list to include lantus 260 units each morning. He will sometimes increase to 300 units "because there is just a little left in the pen".  24-hr recall:   Since last visit, he has been more mindful of food choices, decreasing fried foods and carbohydrates.   B ( AM): coffee with half and half, special K with fruit with 2% milk OR oatmeal  with fruit with 2% milk, 2 slices white toast with 1 pat of butter, Truvia Snk ( AM): none  L ( PM): Out to eat:  [employer] -salad OR BBQ sandwich OR hamburger at [employer] with fries Or hamburger [employer] or fries OR home:  Ham and cheese sandwich with diet coke Snk ( PM): none D (5-7 PM): K & W:  veges and chopped steak or baked spaghetti or Home:  Ham and cheese sandwich or frozen pot pie or [employer]:  Chicken filet biscuit with french fries or [place] with brown rice Snk ( PM): none or pretzels Beverages: coffee with half and half, 1 diet coke, water  Usual physical activity: none or yard work.  Complains of problems with Neuropathy. Since last visit, he has been walking 2-3 times per week in [place].  Estimated energy needs: 1600 calories 180 g carbohydrates 100 g protein 53 g fat  Progress Towards Goal(s):  In progress.   Nutritional Diagnosis:  NB-1.1 Food and nutrition-related knowledge deficit As related to balance of carbohydrate, protein, and fat.  As evidenced by diet hx.    Intervention:  Nutrition counseling and diabetes education continued.  Continue to be as active as possible. Be mindful about the choices that you are making when you are eating out. Choose baked, broiled, grilled rather than fried Eat  more non starchy vegetables. Be aware of your fullness and appetite. Good job in the changes that you have made.  Teaching Method Utilized:  Visual Auditory Hands on  Handouts given during visit include:  Resources for healthy nutrition  Barriers to learning/adherence to lifestyle change: increased choice to eat out  Demonstrated degree of understanding via:  Teach Back   Monitoring/Evaluation:  Dietary intake, exercise, and body weight in 3 months.

## 2014-11-10 ENCOUNTER — Encounter: Payer: Self-pay | Admitting: Dietician

## 2014-11-15 ENCOUNTER — Ambulatory Visit (INDEPENDENT_AMBULATORY_CARE_PROVIDER_SITE_OTHER): Payer: Commercial Managed Care - PPO | Admitting: Endocrinology

## 2014-11-15 ENCOUNTER — Encounter: Payer: Self-pay | Admitting: Endocrinology

## 2014-11-15 VITALS — BP 124/86 | HR 90 | Temp 98.1°F | Ht 72.0 in | Wt 309.0 lb

## 2014-11-15 DIAGNOSIS — E538 Deficiency of other specified B group vitamins: Secondary | ICD-10-CM | POA: Diagnosis not present

## 2014-11-15 DIAGNOSIS — E118 Type 2 diabetes mellitus with unspecified complications: Secondary | ICD-10-CM | POA: Diagnosis not present

## 2014-11-15 LAB — POCT GLYCOSYLATED HEMOGLOBIN (HGB A1C): HEMOGLOBIN A1C: 7.7

## 2014-11-15 MED ORDER — CYANOCOBALAMIN 1000 MCG/ML IJ SOLN
1000.0000 ug | Freq: Once | INTRAMUSCULAR | Status: AC
  Administered 2014-11-15: 1000 ug via INTRAMUSCULAR

## 2014-11-15 MED ORDER — INSULIN GLARGINE 100 UNIT/ML SOLOSTAR PEN: 300.0000 [IU] | PEN_INJECTOR | SUBCUTANEOUS | Status: DC

## 2014-11-15 NOTE — Progress Notes (Signed)
Subjective:    Patient ID: Martin Agee Sr., male    DOB: [birth year], 69 y.o.   MRN: [id number]  HPI Pt returns for f/u of diabetes mellitus: DM type: 1 Dx'ed: 4034 Complications: polyneuropathy Therapy: insulin since 2013. DKA: never Severe hypoglycemia: never.  Pancreatitis: never.   Other: he chose to change to a qd insulin regimen, after poor results with multiple daily injections. Interval history: he brings a record of his cbg's which i have reviewed today.  It varies from 63-150 in am, which is the only time of day he checks.  pt states he feels well in general.  He takes 300 units qam.    Past Medical History  Diagnosis Date  . Diabetes mellitus   . Hypertension     Past Surgical History  Procedure Laterality Date  . Hernia repair      >10 years ago  . Right shoulder surgery      around 2014    History   Social History  . Marital Status: Married    Spouse Name: N/A  . Number of Children: N/A  . Years of Education: N/A   Occupational History  . Not on file.   Social History Main Topics  . Smoking status: Former Smoker    Quit date: 04/29/2000  . Smokeless tobacco: Never Used     Comment: smoked for 10 years on and off  . Alcohol Use: 0.0 oz/week    0 Standard drinks or equivalent per week     Comment: 6 martinis a year  . Drug Use: No  . Sexual Activity: Yes   Other Topics Concern  . Not on file   Social History Narrative   Married. 3 kids. 7 grandkids. No greatgrandkids.       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 ACCU-CHEK COMPACT TEST DRUM test strip TEST AS DIRECTED 100 each 3  . amitriptyline (ELAVIL) 50 MG tablet Take 1 tablet (50 mg total) by mouth at bedtime. 30 tablet 5  . Folic Acid-Vit V4-QVZ D63 (FOLBEE) 2.5-25-1 MG TABS Take 1 tablet by mouth 2 (two) times daily.       Marland Kitchen glucose blood test strip 100 each by Other route 3 (three) times daily. Use as instructed 100 each 12  . Insulin Pen Needle (EASY TOUCH PEN NEEDLES) 32G X 6 MM MISC Use 2 times daily 100 each 3  . losartan-hydrochlorothiazide (HYZAAR) 100-12.5 MG per tablet TAKE 1 TABLET BY MOUTH EVERY DAY 30 tablet 3  . rosuvastatin (CRESTOR) 5 MG tablet Take 1 tablet (5 mg total) by mouth daily. 30 tablet 5   No current facility-administered medications on file prior to visit.    Allergies  Allergen Reactions  . Simvastatin Diarrhea    Family History  Problem Relation Age of Onset  . Hypertension Mother   . Heart disease Mother     CHF did not see doctor  . Diabetes Maternal Grandmother   . Diabetes Son     BP 124/86 mmHg  Pulse 90  Temp(Src) 98.1 F (36.7 C) (Oral)  Ht 6' (1.829 m)  Wt 309 lb (140.161 kg)  BMI 41.90 kg/m2  SpO2 98%  Review of Systems He has lost a few lbs, due to his efforts.     Objective:   Physical Exam VITAL  SIGNS:  See vs page GENERAL: no distress Pulses: dorsalis pedis intact bilat.   MSK: no deformity of the feet CV: 1+ bilat leg edema Skin:  no ulcer on the feet.  normal color and temp on the feet. Neuro: sensation is intact to touch on the feet, but decreased from normal.      A1c=7.7%    Assessment & Plan:  DM: this is the best control this pt should aim for, given this regimen, which does match insulin to his changing needs throughout the day.  The pattern of his cbg's indicates he needs a faster-acting qd insulin: I advised pt to change to levemir, but he declines.   Patient is advised the following: Patient Instructions  check your blood sugar twice a day.  vary the time of day when you check, between before the 3 meals, and at bedtime.  also check if you have symptoms of your blood sugar being too high or too low.  please keep a record of the readings and bring it to your next appointment here.  You can write it on any piece of paper.   please call us sooner if your blood sugar goes below 70, or if you have a lot of readings over 200.  It is especially important to check at bedtime.   Please continue to pursue the weight loss surgery.     Please come back for a follow-up appointment in 3 months.     '

## 2014-11-15 NOTE — Patient Instructions (Addendum)
check your blood sugar twice a day.  vary the time of day when you check, between before the 3 meals, and at bedtime.  also check if you have symptoms of your blood sugar being too high or too low.  please keep a record of the readings and bring it to your next appointment here.  You can write it on any piece of paper.  please call us sooner if your blood sugar goes below 70, or if you have a lot of readings over 200.  It is especially important to check at bedtime.   Please continue to pursue the weight loss surgery.     Please come back for a follow-up appointment in 3 months.

## 2014-11-25 ENCOUNTER — Encounter (INDEPENDENT_AMBULATORY_CARE_PROVIDER_SITE_OTHER): Payer: Commercial Managed Care - PPO | Admitting: Ophthalmology

## 2014-11-25 DIAGNOSIS — E11331 Type 2 diabetes mellitus with moderate nonproliferative diabetic retinopathy with macular edema: Secondary | ICD-10-CM

## 2014-11-25 DIAGNOSIS — H43813 Vitreous degeneration, bilateral: Secondary | ICD-10-CM

## 2014-11-25 DIAGNOSIS — H35033 Hypertensive retinopathy, bilateral: Secondary | ICD-10-CM | POA: Diagnosis not present

## 2014-11-25 DIAGNOSIS — I10 Essential (primary) hypertension: Secondary | ICD-10-CM

## 2014-11-25 DIAGNOSIS — E11311 Type 2 diabetes mellitus with unspecified diabetic retinopathy with macular edema: Secondary | ICD-10-CM

## 2014-12-16 ENCOUNTER — Other Ambulatory Visit (INDEPENDENT_AMBULATORY_CARE_PROVIDER_SITE_OTHER): Payer: Commercial Managed Care - PPO | Admitting: Ophthalmology

## 2014-12-16 DIAGNOSIS — E11311 Type 2 diabetes mellitus with unspecified diabetic retinopathy with macular edema: Secondary | ICD-10-CM | POA: Diagnosis not present

## 2014-12-16 DIAGNOSIS — E11331 Type 2 diabetes mellitus with moderate nonproliferative diabetic retinopathy with macular edema: Secondary | ICD-10-CM | POA: Diagnosis not present

## 2014-12-23 ENCOUNTER — Encounter (INDEPENDENT_AMBULATORY_CARE_PROVIDER_SITE_OTHER): Payer: Commercial Managed Care - PPO | Admitting: Ophthalmology

## 2014-12-23 DIAGNOSIS — H2513 Age-related nuclear cataract, bilateral: Secondary | ICD-10-CM | POA: Diagnosis not present

## 2014-12-23 DIAGNOSIS — H35033 Hypertensive retinopathy, bilateral: Secondary | ICD-10-CM | POA: Diagnosis not present

## 2014-12-23 DIAGNOSIS — I10 Essential (primary) hypertension: Secondary | ICD-10-CM | POA: Diagnosis not present

## 2014-12-23 DIAGNOSIS — E11311 Type 2 diabetes mellitus with unspecified diabetic retinopathy with macular edema: Secondary | ICD-10-CM

## 2014-12-23 DIAGNOSIS — H43813 Vitreous degeneration, bilateral: Secondary | ICD-10-CM

## 2014-12-23 DIAGNOSIS — E11331 Type 2 diabetes mellitus with moderate nonproliferative diabetic retinopathy with macular edema: Secondary | ICD-10-CM | POA: Diagnosis not present

## 2015-01-20 ENCOUNTER — Encounter (INDEPENDENT_AMBULATORY_CARE_PROVIDER_SITE_OTHER): Payer: Commercial Managed Care - PPO | Admitting: Ophthalmology

## 2015-01-20 DIAGNOSIS — E11311 Type 2 diabetes mellitus with unspecified diabetic retinopathy with macular edema: Secondary | ICD-10-CM | POA: Diagnosis not present

## 2015-01-20 DIAGNOSIS — H43813 Vitreous degeneration, bilateral: Secondary | ICD-10-CM

## 2015-01-20 DIAGNOSIS — E11331 Type 2 diabetes mellitus with moderate nonproliferative diabetic retinopathy with macular edema: Secondary | ICD-10-CM | POA: Diagnosis not present

## 2015-01-20 DIAGNOSIS — I10 Essential (primary) hypertension: Secondary | ICD-10-CM | POA: Diagnosis not present

## 2015-01-20 DIAGNOSIS — H35033 Hypertensive retinopathy, bilateral: Secondary | ICD-10-CM

## 2015-01-20 LAB — HM DIABETES EYE EXAM

## 2015-01-23 ENCOUNTER — Other Ambulatory Visit: Payer: Self-pay | Admitting: Family Medicine

## 2015-02-03 ENCOUNTER — Other Ambulatory Visit (INDEPENDENT_AMBULATORY_CARE_PROVIDER_SITE_OTHER): Payer: Commercial Managed Care - PPO | Admitting: Ophthalmology

## 2015-02-03 ENCOUNTER — Encounter (INDEPENDENT_AMBULATORY_CARE_PROVIDER_SITE_OTHER): Payer: Commercial Managed Care - PPO | Admitting: Ophthalmology

## 2015-02-03 DIAGNOSIS — E11311 Type 2 diabetes mellitus with unspecified diabetic retinopathy with macular edema: Secondary | ICD-10-CM

## 2015-02-03 DIAGNOSIS — E113311 Type 2 diabetes mellitus with moderate nonproliferative diabetic retinopathy with macular edema, right eye: Secondary | ICD-10-CM | POA: Diagnosis not present

## 2015-02-07 ENCOUNTER — Ambulatory Visit: Payer: Commercial Managed Care - PPO | Admitting: Dietician

## 2015-02-14 ENCOUNTER — Encounter: Payer: Self-pay | Admitting: Endocrinology

## 2015-02-14 ENCOUNTER — Ambulatory Visit (INDEPENDENT_AMBULATORY_CARE_PROVIDER_SITE_OTHER): Payer: Commercial Managed Care - PPO | Admitting: Endocrinology

## 2015-02-14 VITALS — BP 136/82 | HR 102 | Temp 98.7°F | Ht 72.0 in | Wt 313.0 lb

## 2015-02-14 DIAGNOSIS — E118 Type 2 diabetes mellitus with unspecified complications: Secondary | ICD-10-CM

## 2015-02-14 LAB — POCT GLYCOSYLATED HEMOGLOBIN (HGB A1C): Hemoglobin A1C: 6.6

## 2015-02-14 MED ORDER — INSULIN GLARGINE 100 UNIT/ML SOLOSTAR PEN: 270.0000 [IU] | PEN_INJECTOR | SUBCUTANEOUS | Status: DC

## 2015-02-14 NOTE — Progress Notes (Signed)
Subjective:    Patient ID: Martin Agee Sr., male    DOB: December 27, 1945, 69 y.o.   MRN: [id number]  HPI Pt returns for f/u of diabetes mellitus: DM type: Insulin-requiring type 2 Dx'ed: 6063 Complications: polyneuropathy and retinopathy.   Therapy: insulin since 2013.  DKA: never Severe hypoglycemia: never.  Pancreatitis: never.   Other: he chose to change to a qd insulin regimen, after poor results with multiple daily injections. Interval history: he brings a record of his cbg's which i have reviewed today.  It varies from 63-303, but most are in the 100's.  pt states he feels well in general. Past Medical History  Diagnosis Date  . Diabetes mellitus   . Hypertension     Past Surgical History  Procedure Laterality Date  . Hernia repair      >10 years ago  . Right shoulder surgery      around 2014    Social History   Social History  . Marital Status: Married    Spouse Name: N/A  . Number of Children: N/A  . Years of Education: N/A   Occupational History  . Not on file.   Social History Main Topics  . Smoking status: Former Smoker    Quit date: 04/29/2000  . Smokeless tobacco: Never Used     Comment: smoked for 10 years on and off  . Alcohol Use: 0.0 oz/week    0 Standard drinks or equivalent per week     Comment: 6 martinis a year  . Drug Use: No  . Sexual Activity: Yes   Other Topics Concern  . Not on file   Social History Narrative   Married. 3 kids. 7 grandkids. No greatgrandkids.       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 ACCU-CHEK COMPACT TEST DRUM test strip TEST AS DIRECTED 100 each 3  . amitriptyline (ELAVIL) 50 MG tablet TAKE 1 TABLET BY MOUTH AT BEDTIME 30 tablet 5  . Folic Acid-Vit K1-SWF U93 (FOLBEE) 2.5-25-1 MG TABS Take 1 tablet by mouth 2 (two) times daily.      Marland Kitchen  glucose blood test strip 100 each by Other route 3 (three) times daily. Use as instructed 100 each 12  . Insulin Pen Needle (EASY TOUCH PEN NEEDLES) 32G X 6 MM MISC Use 2 times daily 100 each 3  . losartan-hydrochlorothiazide (HYZAAR) 100-12.5 MG per tablet TAKE 1 TABLET BY MOUTH EVERY DAY 30 tablet 3  . rosuvastatin (CRESTOR) 5 MG tablet Take 1 tablet (5 mg total) by mouth daily. 30 tablet 5   No current facility-administered medications on file prior to visit.    Allergies  Allergen Reactions  . Simvastatin Diarrhea    Family History  Problem Relation Age of Onset  . Hypertension Mother   . Heart disease Mother     CHF did not see doctor  . Diabetes Maternal Grandmother   . Diabetes Son     BP 136/82 mmHg  Pulse 102  Temp(Src) 98.7 F (37.1 C) (Oral)  Ht 6' (1.829 m)  Wt 313 lb (141.976 kg)  BMI 42.44 kg/m2  SpO2 97%  Review of Systems Denies LOC    Objective:   Physical Exam VITAL SIGNS: See vs page GENERAL: no distress Pulses: dorsalis pedis intact bilat.  MSK: no deformity of the  feet CV: 1+ bilat leg edema Skin: no ulcer on the feet. normal color and temp on the feet. Neuro: sensation is intact to touch on the feet, but decreased from normal.  Lab Results  Component Value Date   HGBA1C 6.6 02/14/2015      Assessment & Plan:  DM: overcontrolled, given this regimen, which does match insulin to her changing needs throughout the day  Patient is advised the following: Patient Instructions  check your blood sugar twice a day.  vary the time of day when you check, between before the 3 meals, and at bedtime.  also check if you have symptoms of your blood sugar being too high or too low.  please keep a record of the readings and bring it to your next appointment here.  You can write it on any piece of paper.  please call us sooner if your blood sugar goes below 70, or if you have a lot of readings over 200.  It is especially important to check at bedtime.     Please continue to pursue the weight loss surgery.     Please reduce the insulin to 270 units each morning.   Please come back for a follow-up appointment in 3 months.

## 2015-02-14 NOTE — Patient Instructions (Addendum)
check your blood sugar twice a day.  vary the time of day when you check, between before the 3 meals, and at bedtime.  also check if you have symptoms of your blood sugar being too high or too low.  please keep a record of the readings and bring it to your next appointment here.  You can write it on any piece of paper.  please call us sooner if your blood sugar goes below 70, or if you have a lot of readings over 200.  It is especially important to check at bedtime.   Please continue to pursue the weight loss surgery.     Please reduce the insulin to 270 units each morning.   Please come back for a follow-up appointment in 3 months.

## 2015-02-17 ENCOUNTER — Encounter (INDEPENDENT_AMBULATORY_CARE_PROVIDER_SITE_OTHER): Payer: Commercial Managed Care - PPO | Admitting: Ophthalmology

## 2015-02-17 DIAGNOSIS — E113313 Type 2 diabetes mellitus with moderate nonproliferative diabetic retinopathy with macular edema, bilateral: Secondary | ICD-10-CM | POA: Diagnosis not present

## 2015-02-17 DIAGNOSIS — I10 Essential (primary) hypertension: Secondary | ICD-10-CM

## 2015-02-17 DIAGNOSIS — H43813 Vitreous degeneration, bilateral: Secondary | ICD-10-CM

## 2015-02-17 DIAGNOSIS — H35033 Hypertensive retinopathy, bilateral: Secondary | ICD-10-CM | POA: Diagnosis not present

## 2015-02-17 DIAGNOSIS — E11311 Type 2 diabetes mellitus with unspecified diabetic retinopathy with macular edema: Secondary | ICD-10-CM

## 2015-02-17 LAB — HM DIABETES EYE EXAM

## 2015-02-21 ENCOUNTER — Ambulatory Visit (INDEPENDENT_AMBULATORY_CARE_PROVIDER_SITE_OTHER): Payer: Commercial Managed Care - PPO | Admitting: Family Medicine

## 2015-02-21 ENCOUNTER — Encounter: Payer: Self-pay | Admitting: Family Medicine

## 2015-02-21 VITALS — BP 126/82 | HR 78 | Temp 98.0°F | Wt 318.0 lb

## 2015-02-21 DIAGNOSIS — Z23 Encounter for immunization: Secondary | ICD-10-CM | POA: Diagnosis not present

## 2015-02-21 DIAGNOSIS — E785 Hyperlipidemia, unspecified: Secondary | ICD-10-CM | POA: Diagnosis not present

## 2015-02-21 DIAGNOSIS — I10 Essential (primary) hypertension: Secondary | ICD-10-CM

## 2015-02-21 DIAGNOSIS — E1165 Type 2 diabetes mellitus with hyperglycemia: Secondary | ICD-10-CM

## 2015-02-21 DIAGNOSIS — E11319 Type 2 diabetes mellitus with unspecified diabetic retinopathy without macular edema: Secondary | ICD-10-CM | POA: Insufficient documentation

## 2015-02-21 DIAGNOSIS — Z794 Long term (current) use of insulin: Secondary | ICD-10-CM

## 2015-02-21 DIAGNOSIS — E11311 Type 2 diabetes mellitus with unspecified diabetic retinopathy with macular edema: Secondary | ICD-10-CM

## 2015-02-21 NOTE — Assessment & Plan Note (Addendum)
S: suspect improved control. Crestor 5mg . Mild myalgias in back muscles at times. 3-4 days off and resolves issues A/P: See avs. Trial every other day and titrate down all the way to red yeast rice if needed. Follow up with labs at CPE. Weight up 19 lbs since CPE- advised weight loss (patient is not interested in working toward this)

## 2015-02-21 NOTE — Patient Instructions (Addendum)
Flu shot received today.  Sign release of information at the front desk for records from Dr Zigmund Daniel  Change Crestor to every other day dosing to see if back ache is better.  If not better within 4 weeks, cut back to once a week.  If not better within 4 weeks, stop medicine.  2 weeks later trial red yeast rice  Check cholesterol next visit  Blood pressure looks good.   See [place] back for a physical in about 6 months Make sure lab orders Hepatitis C and PSA per your request

## 2015-02-21 NOTE — Assessment & Plan Note (Signed)
S: followed by endocrine. Insulin adjusted down due to hypoglycemia. a1c was 6.6 but likely inappropriately low due to hypoglycemia A/P: adjustments per Dr. Loanne Drilling in meds. I encouraged patient to make lifestyle changes- he appears resistant. Eating 1 big meal a day and "nibbling" rest of day though will not get into specifics. Exercise is minimal- advised this as well.

## 2015-02-21 NOTE — Assessment & Plan Note (Signed)
S:Referred to optho by optometry. Dr. Mickie Bail and retinopathy since end of June. Monthly injections And has has 2 laser treatments. Very hard on patient A/P: continue follow up with Dr. Zigmund Daniel. Asked patient to sign ROI today so we could get records.

## 2015-02-21 NOTE — Progress Notes (Signed)
Martin Reddish, MD  Subjective:  Martin [facility] Sr. is a 69 y.o. year old very pleasant male patient who presents for/with See problem oriented charting ROS- no chest pain or shortness of breath. No headache. Does have some vision issues- improving with treatment by optho  Past Medical History-  Patient Active Problem List   Diagnosis Date Noted  . DM (diabetes mellitus) type II uncontrolled with eye manifestation ([place]) 02/03/2007    Priority: High  . Hyperlipidemia 08/24/2014    Priority: Medium  . Diabetic polyneuropathy ([place]) 04/11/2009    Priority: Medium  . Essential hypertension 02/03/2007    Priority: Medium  . Former smoker 08/24/2014    Priority: Low  . Ingrowing toenail 07/19/2014    Priority: Low  . Numbness 04/14/2014    Priority: Low  . Obesity 04/12/2014    Priority: Low  . BACK PAIN, LUMBAR, WITH RADICULOPATHY 08/30/2008    Priority: Low  . Diabetic retinopathy ([place]) 02/21/2015    Medications- reviewed and updated Current Outpatient Prescriptions  Medication Sig Dispense Refill  . ACCU-CHEK COMPACT TEST DRUM test strip TEST AS DIRECTED 100 each 3  . amitriptyline (ELAVIL) 50 MG tablet TAKE 1 TABLET BY MOUTH AT BEDTIME 30 tablet 5  . Folic Acid-Vit A2-NKN L97 (FOLBEE) 2.5-25-1 MG TABS Take 1 tablet by mouth 2 (two) times daily.      Marland Kitchen glucose blood test strip 100 each by Other route 3 (three) times daily. Use as instructed 100 each 12  . Insulin Glargine (LANTUS) 100 UNIT/ML Solostar Pen Inject 270 Units into the skin every morning. And pen needles 4/day 105 mL 11  . Insulin Pen Needle (EASY TOUCH PEN NEEDLES) 32G X 6 MM MISC Use 2 times daily 100 each 3  . losartan-hydrochlorothiazide (HYZAAR) 100-12.5 MG per tablet TAKE 1 TABLET BY MOUTH EVERY DAY 30 tablet 3  . rosuvastatin (CRESTOR) 5 MG tablet Take 1 tablet (5 mg total) by mouth daily. 30 tablet 5   No current facility-administered medications for this visit.    Objective: BP 126/82 mmHg  Pulse 78   Temp(Src) 98 F (36.7 C)  Wt 318 lb (144.244 kg) Gen: NAD, resting comfortably CV: RRR no murmurs rubs or gallops Lungs: CTAB no crackles, wheeze, rhonchi Abdomen: soft/nontender/nondistended/normal bowel sounds. No rebound or guarding.  Ext: trace edema. Declines foot exam as just had with Dr. Loanne Drilling Skin: warm, dry Neuro: grossly normal, moves all extremities  Assessment/Plan:  Hyperlipidemia S: suspect improved control. Crestor 5mg . Mild myalgias in back muscles at times. 3-4 days off and resolves issues A/P: See avs. Trial every other day and titrate down all the way to red yeast rice if needed. Follow up with labs at CPE. Weight up 19 lbs since CPE- advised weight loss (patient is not interested in working toward this)   Essential hypertension S: controlled. On Losartan-hctz 100-12.5mg  A/P:Continue current meds as doing well   Diabetic retinopathy ([place]) S:Referred to optho by optometry. Dr. Mickie Bail and retinopathy since end of June. Monthly injections And has has 2 laser treatments. Very hard on patient A/P: continue follow up with Dr. Zigmund Daniel. Asked patient to sign ROI today so we could get records.   DM (diabetes mellitus) type II uncontrolled with eye manifestation ([place]) S: followed by endocrine. Insulin adjusted down due to hypoglycemia. a1c was 6.6 but likely inappropriately low due to hypoglycemia A/P: adjustments per Dr. Loanne Drilling in meds. I encouraged patient to make lifestyle changes- he appears resistant. Eating 1 big meal a day  and "nibbling" rest of day though will not get into specifics. Exercise is minimal- advised this as well.   6 months CPE Requests PSA and Hep C at that time. Not on medicare currently.   Orders Placed This Encounter  Procedures  . Flu Vaccine QUAD 36+ mos IM

## 2015-02-21 NOTE — Assessment & Plan Note (Signed)
S: controlled. On Losartan-hctz 100-12.5mg  A/P:Continue current meds as doing well

## 2015-02-25 ENCOUNTER — Other Ambulatory Visit: Payer: Self-pay | Admitting: Family Medicine

## 2015-03-17 ENCOUNTER — Encounter (INDEPENDENT_AMBULATORY_CARE_PROVIDER_SITE_OTHER): Payer: Commercial Managed Care - PPO | Admitting: Ophthalmology

## 2015-03-17 DIAGNOSIS — H35033 Hypertensive retinopathy, bilateral: Secondary | ICD-10-CM | POA: Diagnosis not present

## 2015-03-17 DIAGNOSIS — H43813 Vitreous degeneration, bilateral: Secondary | ICD-10-CM

## 2015-03-17 DIAGNOSIS — E11311 Type 2 diabetes mellitus with unspecified diabetic retinopathy with macular edema: Secondary | ICD-10-CM | POA: Diagnosis not present

## 2015-03-17 DIAGNOSIS — E113313 Type 2 diabetes mellitus with moderate nonproliferative diabetic retinopathy with macular edema, bilateral: Secondary | ICD-10-CM

## 2015-03-17 DIAGNOSIS — I10 Essential (primary) hypertension: Secondary | ICD-10-CM

## 2015-04-14 ENCOUNTER — Encounter (INDEPENDENT_AMBULATORY_CARE_PROVIDER_SITE_OTHER): Payer: Commercial Managed Care - PPO | Admitting: Ophthalmology

## 2015-04-14 DIAGNOSIS — E11311 Type 2 diabetes mellitus with unspecified diabetic retinopathy with macular edema: Secondary | ICD-10-CM

## 2015-04-14 DIAGNOSIS — H43813 Vitreous degeneration, bilateral: Secondary | ICD-10-CM

## 2015-04-14 DIAGNOSIS — H35033 Hypertensive retinopathy, bilateral: Secondary | ICD-10-CM | POA: Diagnosis not present

## 2015-04-14 DIAGNOSIS — I10 Essential (primary) hypertension: Secondary | ICD-10-CM

## 2015-04-14 DIAGNOSIS — E113313 Type 2 diabetes mellitus with moderate nonproliferative diabetic retinopathy with macular edema, bilateral: Secondary | ICD-10-CM

## 2015-04-18 ENCOUNTER — Ambulatory Visit (INDEPENDENT_AMBULATORY_CARE_PROVIDER_SITE_OTHER): Payer: Commercial Managed Care - PPO | Admitting: Podiatry

## 2015-04-18 ENCOUNTER — Encounter: Payer: Self-pay | Admitting: Podiatry

## 2015-04-18 VITALS — BP 196/105 | HR 98 | Resp 16

## 2015-04-18 DIAGNOSIS — S91209A Unspecified open wound of unspecified toe(s) with damage to nail, initial encounter: Secondary | ICD-10-CM

## 2015-04-18 DIAGNOSIS — L600 Ingrowing nail: Secondary | ICD-10-CM

## 2015-04-18 MED ORDER — CEPHALEXIN 500 MG PO CAPS: 500.0000 mg | ORAL_CAPSULE | Freq: Four times a day (QID) | ORAL | Status: DC

## 2015-04-18 NOTE — Progress Notes (Signed)
   Subjective:    Patient ID: Martin Agee Sr., male    DOB: 1945-10-16, 69 y.o.   MRN: [id number]  HPI this patient presents to the office with chief complaint of a problem with his right great toenail. He says yesterday he noticed that the nail was unattached and the skin around the nail was not looking normal. He was concerned about the toenail and decided to make an appointment for an evaluation of his right big toe. He says that he has  neuropathy and he does not remember any time in which she injured the right toenail. He says the injury could've happened last week. He has provided no self treatment for this condition since he was unaware of its presence.Marland Kitchen He presents to the office for an evaluation and treatment of this condition Patient presents here today with right great toenail that is loose and has a split on bottom of the nail since 1 day ago.   Review of Systems  All other systems reviewed and are negative.      Objective:   Physical Exam GENERAL APPEARANCE: Alert, conversant. Appropriately groomed. No acute distress.  VASCULAR: Pedal pulses are not  palpable at  [facility] and PT bilateral.  Capillary refill time is immediate to all digits,  Normal temperature gradient.   NEUROLOGIC: sensation is diminished to absent  to 5.07 monofilament at 5/5 sites bilateral.  Light touch is intact bilateral, Muscle strength normal.  MUSCULOSKELETAL: acceptable muscle strength, tone and stability bilateral.  Intrinsic muscluature intact bilateral.  Rectus appearance of foot and digits noted bilateral.   DERMATOLOGIC: skin color, texture, and turgor are within normal limits.  No preulcerative lesions or ulcers  are seen, no interdigital maceration noted.     NAILS  The right hallux toenail is unattached from nail bed.  The proximal nail has lifted the skin at the proximal nail fold.  There is drainage exiting from the proximal nail fold.. There is localized  nail problem with the toe itself not being  involved except at the proximal nail fold. The distal hallux. skin appears to be separated from the subcutaneous tissue and may slough off in the near future..           Assessment & Plan:  Traumatic Nail Avulsion right hallux.  Possible skin sloughing noted over distal aspect right hallux.  IE  Nail surgery. Treatment options and alternatives discussed.  Recommended an incision and drainage and patient agreed.  Right hallux was prepped with alcohol and a 3cc. of  2% lidocaine plain was administered in a digital block fashion.  The toe was then prepped with betadine solution .  The offending nail border was then excised and all necrotic tissue was resected.  The area was then cleansed  and antibiotic ointment and a dry sterile dressing was applied.  The patient was dispensed instructions for aftercare. It was noted during the procedure that his skin distally over the hallux was loosed.  I chose not to remove the sloughed skin but included the skin in the bandage hoping the skin would protect the distal subcutaneous skin.  Prescribed cephalexin to prevent infection.  RTC 1 week.   prophylactically. Home instructions given. If this condition worsens or becomes very painful, the patient was told to contact this office or go to the Emergency Department at the hospital.  Gardiner Barefoot DPM

## 2015-04-24 ENCOUNTER — Encounter: Payer: Self-pay | Admitting: Podiatry

## 2015-04-24 ENCOUNTER — Ambulatory Visit (INDEPENDENT_AMBULATORY_CARE_PROVIDER_SITE_OTHER): Payer: Commercial Managed Care - PPO | Admitting: Podiatry

## 2015-04-24 DIAGNOSIS — L89891 Pressure ulcer of other site, stage 1: Secondary | ICD-10-CM | POA: Diagnosis not present

## 2015-04-24 DIAGNOSIS — S91209D Unspecified open wound of unspecified toe(s) with damage to nail, subsequent encounter: Secondary | ICD-10-CM

## 2015-04-24 MED ORDER — CEPHALEXIN 500 MG PO CAPS: 500.0000 mg | ORAL_CAPSULE | Freq: Two times a day (BID) | ORAL | Status: DC

## 2015-04-24 MED ORDER — SILVER SULFADIAZINE 1 % EX CREA: 1.0000 "application " | TOPICAL_CREAM | Freq: Every day | CUTANEOUS | Status: DC

## 2015-04-24 NOTE — Progress Notes (Signed)
Subjective:     Patient ID: Martin Agee Sr., male   DOB: 1946/02/28, 69 y.o.   MRN: [id number]  HPI this patient returns to the office follow a traumatic nail avulsion of the right hallux toenail. He was evaluated in the office last week and the hallux toenail was removed under local anesthesia. He was sent home with home instructions for soaks as well as antibiotics to help eliminate and prevent an infection. He states he has been bandaging and soaking his toe as recommended. He presents the office with white skin at the tip of his right big toe. There is mild drainage noted from underneath this loose  skin on the distal hallux.He presents for evaluation and treatment.   Review of Systems     Objective:   Physical Exam  :   Physical Exam GENERAL APPEARANCE: Alert, conversant. Appropriately groomed. No acute distress.  VASCULAR: Pedal pulses are not palpable at [facility] and PT bilateral. Capillary refill time is immediate to all digits, Normal temperature gradient.  NEUROLOGIC: sensation is diminished to absent to 5.07 monofilament at 5/5 sites bilateral. Light touch is intact bilateral, Muscle strength normal.  MUSCULOSKELETAL: acceptable muscle strength, tone and stability bilateral. Intrinsic muscluature intact bilateral. Rectus appearance of foot and digits noted bilateral.   DERMATOLOGIC: skin color, texture, and turgor are within normal limits.  , no interdigital maceration noted.   NAILS The right hallux has red granular tissue at the site of the nail right hallux.  The skin is whitened and is unattached to the distal subcutaneous tissue.  Marland KitchenNo evidence of redness or infection with clear drainage noted distally.              Assessment:     Traumatic Nail Avulsion  Right hallux ulcer.      Plan:     Return office visit. Told patient to continue soaks and prescribed cephalexin. #30  twice a day to be taken for the next 2 weeks. Debridement of tissue at the site of  the ulcer. A small circular incision was made in the sloughed  skin at the distal aspect of the right hallux to allow for drainage. Silvadene dry sterile dressing applied.   Prescription for Silvadene was also called into the pharmacy for this patient.If this condition worsens or becomes very painful, the patient was told to contact this office or go to the Emergency Department at the hospital.  Gardiner Barefoot DPM

## 2015-05-12 ENCOUNTER — Encounter (INDEPENDENT_AMBULATORY_CARE_PROVIDER_SITE_OTHER): Payer: Commercial Managed Care - PPO | Admitting: Ophthalmology

## 2015-05-12 DIAGNOSIS — E11311 Type 2 diabetes mellitus with unspecified diabetic retinopathy with macular edema: Secondary | ICD-10-CM | POA: Diagnosis not present

## 2015-05-12 DIAGNOSIS — H35033 Hypertensive retinopathy, bilateral: Secondary | ICD-10-CM

## 2015-05-12 DIAGNOSIS — H43813 Vitreous degeneration, bilateral: Secondary | ICD-10-CM

## 2015-05-12 DIAGNOSIS — H2513 Age-related nuclear cataract, bilateral: Secondary | ICD-10-CM | POA: Diagnosis not present

## 2015-05-12 DIAGNOSIS — E113313 Type 2 diabetes mellitus with moderate nonproliferative diabetic retinopathy with macular edema, bilateral: Secondary | ICD-10-CM

## 2015-05-12 DIAGNOSIS — I10 Essential (primary) hypertension: Secondary | ICD-10-CM

## 2015-05-16 ENCOUNTER — Encounter: Payer: Self-pay | Admitting: Podiatry

## 2015-05-16 ENCOUNTER — Ambulatory Visit (INDEPENDENT_AMBULATORY_CARE_PROVIDER_SITE_OTHER): Payer: Commercial Managed Care - PPO | Admitting: Endocrinology

## 2015-05-16 ENCOUNTER — Ambulatory Visit (INDEPENDENT_AMBULATORY_CARE_PROVIDER_SITE_OTHER): Payer: Commercial Managed Care - PPO | Admitting: Podiatry

## 2015-05-16 ENCOUNTER — Encounter: Payer: Self-pay | Admitting: Endocrinology

## 2015-05-16 ENCOUNTER — Ambulatory Visit (INDEPENDENT_AMBULATORY_CARE_PROVIDER_SITE_OTHER): Payer: Commercial Managed Care - PPO

## 2015-05-16 VITALS — BP 151/86 | HR 92 | Resp 16

## 2015-05-16 VITALS — BP 150/97 | HR 103 | Temp 98.2°F | Ht 72.0 in | Wt 320.0 lb

## 2015-05-16 DIAGNOSIS — R52 Pain, unspecified: Secondary | ICD-10-CM | POA: Diagnosis not present

## 2015-05-16 DIAGNOSIS — L89891 Pressure ulcer of other site, stage 1: Secondary | ICD-10-CM

## 2015-05-16 DIAGNOSIS — L97519 Non-pressure chronic ulcer of other part of right foot with unspecified severity: Secondary | ICD-10-CM

## 2015-05-16 DIAGNOSIS — S91209D Unspecified open wound of unspecified toe(s) with damage to nail, subsequent encounter: Secondary | ICD-10-CM

## 2015-05-16 DIAGNOSIS — E11621 Type 2 diabetes mellitus with foot ulcer: Secondary | ICD-10-CM

## 2015-05-16 DIAGNOSIS — E11311 Type 2 diabetes mellitus with unspecified diabetic retinopathy with macular edema: Secondary | ICD-10-CM | POA: Diagnosis not present

## 2015-05-16 DIAGNOSIS — L97529 Non-pressure chronic ulcer of other part of left foot with unspecified severity: Secondary | ICD-10-CM

## 2015-05-16 DIAGNOSIS — Z794 Long term (current) use of insulin: Secondary | ICD-10-CM

## 2015-05-16 DIAGNOSIS — E1165 Type 2 diabetes mellitus with hyperglycemia: Secondary | ICD-10-CM

## 2015-05-16 LAB — POCT GLYCOSYLATED HEMOGLOBIN (HGB A1C): Hemoglobin A1C: 7

## 2015-05-16 MED ORDER — COLESTIPOL HCL 1 G PO TABS: 4.0000 g | ORAL_TABLET | Freq: Every day | ORAL | Status: DC

## 2015-05-16 MED ORDER — LOSARTAN POTASSIUM-HCTZ 100-25 MG PO TABS: 1.0000 | ORAL_TABLET | Freq: Every day | ORAL | Status: DC

## 2015-05-16 MED ORDER — DOXYCYCLINE HYCLATE 100 MG PO TABS: 100.0000 mg | ORAL_TABLET | Freq: Two times a day (BID) | ORAL | Status: DC

## 2015-05-16 NOTE — Progress Notes (Signed)
Subjective:     Patient ID: Martin Agee Sr., male   DOB: 1945-10-11, 70 y.o.   MRN: [id number]  HPI this patient presents to the office for follow-up for traumatic nail avulsion of his right hallux toenail at today's visit. He admitted to having his nails done at a salon and a few weeks later he noticed that his right big toenail was unattached and bleeding. He presented to my office on 04/18/2015 for a nail avulsion under local anesthesia right foot. Upon removal of the nail. It was noted that his skin over the distal aspect of the right hallux was unattached revealing subcutaneous tissue.  The skin was left in place, but eventually sloughed off approximately one week ago according to this patient.  He presents to the office today with a red, swollen big toe, right foot. He says his toe has started to look better over the last few weeks. He has been soaking his toe and wearing  a bandage over the toe.  Addition ly he has been walking with surgical shoe.  He presents to the office for continued evaluation and treatment of this right big toe. He also admits to new skin lesion under the outside ball of left foot.        Review of Systems     Objective:   Physical Exam GENERAL APPEARANCE: Alert, conversant. Appropriately groomed. No acute distress.  VASCULAR: Pedal pulses are not  palpable at  [facility] and PT bilateral.  Capillary refill time is immediate to all digits,  Normal temperature gradient.  Digital hair growth is present bilateral  NEUROLOGIC: sensation is diminished  to 5.07 monofilament at 5/5 sites bilateral.  Light touch is intact bilateral, Muscle strength normal.  MUSCULOSKELETAL: acceptable muscle strength, tone and stability bilateral.  Intrinsic muscluature intact bilateral.  Rectus appearance of foot and digits noted bilateral.   DERMATOLOGIC: skin color, texture, and turgor are within normal limits.  No preulcerative lesions or ulcers  are seen, no interdigital maceration noted.  No  open lesions present. . No drainage noted. There is 4 mm. X 4 mm. Lesion under fifth metatarsal head left foot. No redness or infection noted.  NAILS  Right hallux is red and swollen at the distal aspect of right hallux. There is decreased redness and swelling plantarly with desquamation.  The tissue distally is pink and newly developed after the original skin sloughed.  There is now necrotic tissue noted at the nail site.  There is ulceration on the dorsal distal aspect of skin distal to the nail bed.      Assessment:     Diabetic ulcer right hallux.  Cellulitis right hallux. Debridement of ulcer left foot.    Plan:     ROV.  Xray taken to reveal flattening of distal phalanx right hallux.  Doxycycline prescribed.  Both ulcers were bandaged with triple antibiotic cream and DSD.  He was given home soak instructions.  Continue in surgical shoe right foot.  Dispersion padding applied to left foot. Discussed silvadene usage and he believes he developed allergy to the medicine.  RTC 1 week.  If this condition worsens or becomes very painful, the patient was told to contact this office or go to the Emergency Department at the hospital.  Gardiner Barefoot DPM

## 2015-05-16 NOTE — Patient Instructions (Addendum)
check your blood sugar twice a day.  vary the time of day when you check, between before the 3 meals, and at bedtime.  also check if you have symptoms of your blood sugar being too high or too low.  please keep a record of the readings and bring it to your next appointment here.  You can write it on any piece of paper.  please call us sooner if your blood sugar goes below 70, or if you have a lot of readings over 200.  It is especially important to check at bedtime.   Please continue to pursue the weight loss surgery.     Please continue the same insulin.   Please come back for a follow-up appointment in 3-4 months.   i have sent a prescription to your pharmacy, for the cholesterol and to increase the blood pressure pill.   Please see Dr Yong Channel as scheduled, to follow up the blood pressure and cholesterol.

## 2015-05-16 NOTE — Progress Notes (Signed)
Subjective:    Patient ID: Martin Agee Sr., male    DOB: 19-Mar-1946, 70 y.o.   MRN: [id number]  HPI  The state of at least three ongoing medical problems is addressed today, with interval history of each noted here:  Pt returns for f/u of diabetes mellitus: DM type: Insulin-requiring type 2 Dx'ed: [id number] Complications: polyneuropathy and retinopathy.   Therapy: insulin since 2013.  DKA: never Severe hypoglycemia: never.  Pancreatitis: never.   Other: he chose to change to a qd insulin regimen, after poor results with multiple daily injections. Interval history: he brings a record of his cbg's which i have reviewed today.  It varies from 61-204, but most are in the 100's.  It is in general higher as the day goes on. pt states he feels well in general.  He denies hypoglycemia.  Dyslipidemia: he did not tolerate crestor, due to myalgias. HTN: denies sob Past Medical History  Diagnosis Date  . Diabetes mellitus   . Hypertension     Past Surgical History  Procedure Laterality Date  . Hernia repair      >10 years ago  . Right shoulder surgery      around 2014    Social History   Social History  . Marital Status: Married    Spouse Name: N/A  . Number of Children: N/A  . Years of Education: N/A   Occupational History  . Not on file.   Social History Main Topics  . Smoking status: Former Smoker    Quit date: 04/29/2000  . Smokeless tobacco: Never Used     Comment: smoked for 10 years on and off  . Alcohol Use: 0.0 oz/week    0 Standard drinks or equivalent per week     Comment: 6 martinis a year  . Drug Use: No  . Sexual Activity: Yes   Other Topics Concern  . Not on file   Social History Narrative   Married. 3 kids. 7 grandkids. No greatgrandkids.       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  ACCU-CHEK COMPACT TEST DRUM test strip TEST AS DIRECTED 100 each 3  . amitriptyline (ELAVIL) 50 MG tablet TAKE 1 TABLET BY MOUTH AT BEDTIME 30 tablet 5  . Folic Acid-Vit Q000111Q 123456 (FOLBEE) 2.5-25-1 MG TABS Take 1 tablet by mouth 2 (two) times daily.      Marland Kitchen glucose blood test strip 100 each by Other route 3 (three) times daily. Use as instructed 100 each 12  . Insulin Glargine (LANTUS) 100 UNIT/ML Solostar Pen Inject 270 Units into the skin every morning. And pen needles 4/day 105 mL 11  . Insulin Pen Needle (EASY TOUCH PEN NEEDLES) 32G X 6 MM MISC Use 2 times daily 100 each 3  . silver sulfADIAZINE (SILVADENE) 1 % cream Apply 1 application topically daily. 50 g 0   No current facility-administered medications on file prior to visit.    Allergies  Allergen Reactions  . Simvastatin Diarrhea    Family History  Problem Relation Age of Onset  . Hypertension Mother   . Heart disease Mother     CHF did not see doctor  . Diabetes Maternal Grandmother   . Diabetes Son     BP 150/97 mmHg  Pulse 103  Temp(Src) 98.2 F (36.8 C) (Oral)  Ht 6' (1.829 m)  Wt 320 lb (145.151 kg)  BMI 43.39 kg/m2  SpO2 91%  Review of Systems Denies chest pain.  He has gained weight.      Objective:   Physical Exam VITAL SIGNS:  See vs page GENERAL: no distress SKIN:  Insulin injection sites at the anterior abdomen are normal.     A1c=7.0%    Assessment & Plan:  HTN: he needs increased rx Dyslipidemia: he needs increased rx DM: this is the best control this pt should aim for, given this regimen, which does match insulin to his changing needs throughout the day.   Patient is advised the following: Patient Instructions  check your blood sugar twice a day.  vary the time of day when you check, between before the 3 meals, and at bedtime.  also check if you have symptoms of your blood sugar being too high or too low.  please keep a record of the readings and bring it to your next appointment here.   You can write it on any piece of paper.  please call us sooner if your blood sugar goes below 70, or if you have a lot of readings over 200.  It is especially important to check at bedtime.   Please continue to pursue the weight loss surgery.     Please continue the same insulin.   Please come back for a follow-up appointment in 3-4 months.   i have sent a prescription to your pharmacy, for the cholesterol and to increase the blood pressure pill.   Please see Dr Yong Channel as scheduled, to follow up the blood pressure and cholesterol.

## 2015-05-26 ENCOUNTER — Encounter: Payer: Self-pay | Admitting: Podiatry

## 2015-05-26 ENCOUNTER — Ambulatory Visit (INDEPENDENT_AMBULATORY_CARE_PROVIDER_SITE_OTHER): Payer: Commercial Managed Care - PPO | Admitting: Podiatry

## 2015-05-26 VITALS — Temp 98.7°F

## 2015-05-26 DIAGNOSIS — R52 Pain, unspecified: Secondary | ICD-10-CM

## 2015-05-26 DIAGNOSIS — L89891 Pressure ulcer of other site, stage 1: Secondary | ICD-10-CM

## 2015-05-26 DIAGNOSIS — L97519 Non-pressure chronic ulcer of other part of right foot with unspecified severity: Secondary | ICD-10-CM

## 2015-05-26 DIAGNOSIS — L97529 Non-pressure chronic ulcer of other part of left foot with unspecified severity: Secondary | ICD-10-CM

## 2015-05-26 DIAGNOSIS — E11621 Type 2 diabetes mellitus with foot ulcer: Secondary | ICD-10-CM

## 2015-05-26 MED ORDER — DOXYCYCLINE HYCLATE 100 MG PO TABS: 100.0000 mg | ORAL_TABLET | Freq: Two times a day (BID) | ORAL | Status: DC

## 2015-05-26 NOTE — Progress Notes (Signed)
Subjective:     Patient ID: Martin Agee Sr., male   DOB: 1945/09/21, 70 y.o.   MRN: [id number]  HPI this patient returns to the office follow-up for diabetic ulcer on his right hallux and under the outside ball of his left foot. He was seen 1 week ago and given instructions for home soaks as well as prescription for doxycycline has soaked in taking his medicine as recommended. He presents the office today stating that his toe is doing much better and the ulcer under the outside ball of his left foot is causing no pain or discomfort. He believes the medicine has helped to decrease the redness and the swelling in his big toe and there is only one area of tissue that needs to heal. He is very pleased with his progress   Review of Systems     Objective:   Physical Exam GENERAL APPEARANCE: Alert, conversant. Appropriately groomed. No acute distress.  VASCULAR: Pedal pulses are not  palpable at  [facility] and PT bilateral.  Capillary refill time is immediate to all digits,  Normal temperature gradient.  Digital hair growth is present bilateral  NEUROLOGIC: sensation is diminished to 5.07 monofilament at 5/5 sites bilateral.  Light touch is intact bilateral, Muscle strength normal.  MUSCULOSKELETAL: acceptable muscle strength, tone and stability bilateral.  Intrinsic muscluature intact bilateral.  Rectus appearance of foot and digits noted bilateral.   DERMATOLOGIC: Ulcer on right hallux measures 10 mm x 10 mm with necrotic tissue over ulcer.  Redness and swelling to toe has diminished.  Healing noted nail bed site right hallux.  Ulcer sub 5th met left foot measures 4 mm x 4 mm.  No redness or swelling noted.  Digital nails are asymptomatic. No drainage noted.      Assessment:     Diabetic ulcer B/L     Plan:     ROV  Debride necrotic tissue both ulcers and DSD applied to both ulcers.  Padding was placed in shoe by myself.  Prescribe doxycycline for 10 days.  Continue home soaks and wearing wooden shoe  right foot. RTC 2 weeks  Gardiner Barefoot DPM

## 2015-06-09 ENCOUNTER — Encounter (INDEPENDENT_AMBULATORY_CARE_PROVIDER_SITE_OTHER): Payer: Commercial Managed Care - PPO | Admitting: Ophthalmology

## 2015-06-09 DIAGNOSIS — H2513 Age-related nuclear cataract, bilateral: Secondary | ICD-10-CM

## 2015-06-09 DIAGNOSIS — I10 Essential (primary) hypertension: Secondary | ICD-10-CM | POA: Diagnosis not present

## 2015-06-09 DIAGNOSIS — E11311 Type 2 diabetes mellitus with unspecified diabetic retinopathy with macular edema: Secondary | ICD-10-CM | POA: Diagnosis not present

## 2015-06-09 DIAGNOSIS — H35033 Hypertensive retinopathy, bilateral: Secondary | ICD-10-CM

## 2015-06-09 DIAGNOSIS — H43813 Vitreous degeneration, bilateral: Secondary | ICD-10-CM

## 2015-06-09 DIAGNOSIS — E113313 Type 2 diabetes mellitus with moderate nonproliferative diabetic retinopathy with macular edema, bilateral: Secondary | ICD-10-CM

## 2015-06-13 ENCOUNTER — Ambulatory Visit: Payer: Commercial Managed Care - PPO | Admitting: Podiatry

## 2015-06-16 ENCOUNTER — Ambulatory Visit (INDEPENDENT_AMBULATORY_CARE_PROVIDER_SITE_OTHER): Payer: Commercial Managed Care - PPO | Admitting: Podiatry

## 2015-06-16 ENCOUNTER — Encounter: Payer: Self-pay | Admitting: Podiatry

## 2015-06-16 VITALS — BP 183/96 | HR 79 | Resp 14

## 2015-06-16 DIAGNOSIS — E11621 Type 2 diabetes mellitus with foot ulcer: Secondary | ICD-10-CM

## 2015-06-16 DIAGNOSIS — L89891 Pressure ulcer of other site, stage 1: Secondary | ICD-10-CM | POA: Diagnosis not present

## 2015-06-16 DIAGNOSIS — L97529 Non-pressure chronic ulcer of other part of left foot with unspecified severity: Principal | ICD-10-CM

## 2015-06-16 DIAGNOSIS — S91209D Unspecified open wound of unspecified toe(s) with damage to nail, subsequent encounter: Secondary | ICD-10-CM

## 2015-06-16 DIAGNOSIS — L97519 Non-pressure chronic ulcer of other part of right foot with unspecified severity: Secondary | ICD-10-CM

## 2015-06-16 NOTE — Progress Notes (Signed)
Subjective:     Patient ID: Martin Agee Sr., male   DOB: 1945-09-02, 70 y.o.   MRN: [id number]  HPI this patient returns to the office follow-up for diabetic ulcer on his right hallux and under the outside ball of his left foot. He was seen 2 weeks  ago and given instructions for home soaks .Marland Kitchen He presents the office today stating that his toe is doing much better and the ulcer under the outside ball of his left foot is causing no pain or discomfort. He believes the medicine has helped to decrease the redness and the swelling in his big toe and there is only one area of tissue that needs to heal. He is very pleased with his progress.  He has been using dispersion padding in his shoes to off weight bear ulcer under the ball of his left foot.He has also finished his antibiotic therapy.   Review of Systems     Objective:   Physical Exam GENERAL APPEARANCE: Alert, conversant. Appropriately groomed. No acute distress.  VASCULAR: Pedal pulses are not  palpable at  [facility] and PT bilateral.  Capillary refill time is immediate to all digits,  Normal temperature gradient.  Digital hair growth is present bilateral  NEUROLOGIC: sensation is diminished to 5.07 monofilament at 5/5 sites bilateral.  Light touch is intact bilateral, Muscle strength normal.  MUSCULOSKELETAL: acceptable muscle strength, tone and stability bilateral.  Intrinsic muscluature intact bilateral.  Rectus appearance of foot and digits noted bilateral.   DERMATOLOGIC: Ulcer on right hallux measures 4 mm x 10 mm with necrotic tissue over ulcer.  Redness and swelling to toe has diminished.  Healing noted nail bed site right hallux.  Ulcer sub 5th met left foot measures 2 mm x 4 mm.  No redness or swelling noted.  Digital nails are asymptomatic. No drainage noted.      Assessment:     Diabetic ulcer B/L     Plan:     ROV  Debride necrotic tissue both ulcers and DSD applied to both ulcers.  Padding was placed in shoe by myself.    Continue  home soaks and wearing shoe with dispersion padding.. RTC 2 weeks To check on diabetic footgear for this patient.  Gardiner Barefoot DPM

## 2015-07-07 ENCOUNTER — Encounter (INDEPENDENT_AMBULATORY_CARE_PROVIDER_SITE_OTHER): Payer: Commercial Managed Care - PPO | Admitting: Ophthalmology

## 2015-07-07 DIAGNOSIS — E113313 Type 2 diabetes mellitus with moderate nonproliferative diabetic retinopathy with macular edema, bilateral: Secondary | ICD-10-CM | POA: Diagnosis not present

## 2015-07-07 DIAGNOSIS — H35033 Hypertensive retinopathy, bilateral: Secondary | ICD-10-CM

## 2015-07-07 DIAGNOSIS — I10 Essential (primary) hypertension: Secondary | ICD-10-CM

## 2015-07-07 DIAGNOSIS — E11311 Type 2 diabetes mellitus with unspecified diabetic retinopathy with macular edema: Secondary | ICD-10-CM | POA: Diagnosis not present

## 2015-07-07 DIAGNOSIS — H43813 Vitreous degeneration, bilateral: Secondary | ICD-10-CM

## 2015-07-07 DIAGNOSIS — H2513 Age-related nuclear cataract, bilateral: Secondary | ICD-10-CM | POA: Diagnosis not present

## 2015-07-11 ENCOUNTER — Ambulatory Visit (INDEPENDENT_AMBULATORY_CARE_PROVIDER_SITE_OTHER): Payer: Commercial Managed Care - PPO | Admitting: Podiatry

## 2015-07-11 ENCOUNTER — Encounter: Payer: Self-pay | Admitting: Podiatry

## 2015-07-11 VITALS — BP 171/89 | HR 99 | Resp 16

## 2015-07-11 DIAGNOSIS — L89891 Pressure ulcer of other site, stage 1: Secondary | ICD-10-CM

## 2015-07-11 DIAGNOSIS — E11621 Type 2 diabetes mellitus with foot ulcer: Secondary | ICD-10-CM

## 2015-07-11 DIAGNOSIS — L97519 Non-pressure chronic ulcer of other part of right foot with unspecified severity: Principal | ICD-10-CM

## 2015-07-11 DIAGNOSIS — L97529 Non-pressure chronic ulcer of other part of left foot with unspecified severity: Secondary | ICD-10-CM

## 2015-07-11 NOTE — Progress Notes (Signed)
Subjective:     Patient ID: Martin Agee Sr., male   DOB: [birth year], 70 y.o.   MRN: [id number]  HPI this patient returns to the office follow-up for diabetic ulcer on his right hallux and under the outside ball of his left foot. He was seen 2 weeks  ago and given instructions for home soaks .Marland Kitchen He presents the office today stating that his toe is doing much better and the ulcer under the outside ball of his left foot is caused  not healing due to the pressure from walking.Marland Kitchen     He has been using dispersion padding in his shoes to off weight bear ulcer under the ball of his left foot. He presents for continued evaluation and treatment.   Review of Systems     Objective:   Physical Exam GENERAL APPEARANCE: Alert, conversant. Appropriately groomed. No acute distress.  VASCULAR: Pedal pulses are not  palpable at  [facility] and PT bilateral.  Capillary refill time is immediate to all digits,  Normal temperature gradient.  Digital hair growth is present bilateral  NEUROLOGIC: sensation is diminished to 5.07 monofilament at 5/5 sites bilateral.  Light touch is intact bilateral, Muscle strength normal.  MUSCULOSKELETAL: acceptable muscle strength, tone and stability bilateral.  Intrinsic muscluature intact bilateral.  Rectus appearance of foot and digits noted bilateral.   DERMATOLOGIC: Ulcer on right hallux measures 4 mm x 2 mm healing lesion.  Redness and swelling to toe has diminished except there appears to be redness due to reaction from band-aid.  The    Ulcer sub 5th met left foot measures 2 mm x 4 mm.  No redness or swelling noted.  Digital nails are asymptomatic. No drainage noted.      Assessment:     Diabetic ulcer B/L     Plan:     ROV  Debride necrotic tissue both ulcers and DSD applied to both ulcers.  Padding was placed increased on his insole   by myself.    Continue home soaks and wearing shoe with dispersion padding.. RTC 2 weeks To check on diabetic footgear for this  patient.  Gardiner Barefoot DPM

## 2015-07-25 ENCOUNTER — Other Ambulatory Visit: Payer: Self-pay | Admitting: Family Medicine

## 2015-07-28 ENCOUNTER — Ambulatory Visit (INDEPENDENT_AMBULATORY_CARE_PROVIDER_SITE_OTHER): Payer: Commercial Managed Care - PPO | Admitting: Podiatry

## 2015-07-28 ENCOUNTER — Encounter: Payer: Self-pay | Admitting: Podiatry

## 2015-07-28 DIAGNOSIS — L97529 Non-pressure chronic ulcer of other part of left foot with unspecified severity: Secondary | ICD-10-CM | POA: Diagnosis not present

## 2015-07-28 DIAGNOSIS — E11621 Type 2 diabetes mellitus with foot ulcer: Secondary | ICD-10-CM

## 2015-07-28 DIAGNOSIS — L97519 Non-pressure chronic ulcer of other part of right foot with unspecified severity: Secondary | ICD-10-CM | POA: Diagnosis not present

## 2015-07-28 NOTE — Progress Notes (Signed)
Subjective:     Patient ID: Martin Agee Sr., male   DOB: 1945-05-06, 70 y.o.   MRN: [id number]  HPI this patient returns to the office follow-up for diabetic ulcer on his right hallux and under the outside ball of his left foot. He was seen 2 weeks  ago and given instructions for home soaks .Marland Kitchen He presents the office today stating that his toe is doing much better and the ulcer under the outside ball of his left foot is caused  Now healing with use of moleskin...     He has also  been using dispersion padding in his shoes to off weight bear ulcer under the ball of his left foot. He presents for continued evaluation and treatment.   Review of Systems     Objective:   Physical Exam GENERAL APPEARANCE: Alert, conversant. Appropriately groomed. No acute distress.  VASCULAR: Pedal pulses are not  palpable at  [facility] and PT bilateral.  Capillary refill time is immediate to all digits,  Normal temperature gradient.  Digital hair growth is present bilateral  NEUROLOGIC: sensation is diminished to 5.07 monofilament at 5/5 sites bilateral.  Light touch is intact bilateral, Muscle strength normal.  MUSCULOSKELETAL: acceptable muscle strength, tone and stability bilateral.  Intrinsic muscluature intact bilateral.  Rectus appearance of foot and digits noted bilateral.   DERMATOLOGIC: Ulcer on right hallux is healed and covered with crust. healing lesion.  Redness and swelling to toe has diminished   The    Ulcer sub 5th met left foot measures 2 mm x 2 mm.  No redness or swelling noted.  Digital nails are asymptomatic. No drainage noted.      Assessment:     Diabetic ulcer B/L     Plan:     ROV  Debride necrotic tissue both ulcers and DSD applied to both ulcers.      Continue home soaks and wearing shoe with dispersion padding.Marland Kitchen RTC 4 weeks To check on diabetic footgear for this patient.  Gardiner Barefoot DPM

## 2015-08-04 ENCOUNTER — Encounter (INDEPENDENT_AMBULATORY_CARE_PROVIDER_SITE_OTHER): Payer: Commercial Managed Care - PPO | Admitting: Ophthalmology

## 2015-08-04 DIAGNOSIS — H43813 Vitreous degeneration, bilateral: Secondary | ICD-10-CM

## 2015-08-04 DIAGNOSIS — H35033 Hypertensive retinopathy, bilateral: Secondary | ICD-10-CM | POA: Diagnosis not present

## 2015-08-04 DIAGNOSIS — E113313 Type 2 diabetes mellitus with moderate nonproliferative diabetic retinopathy with macular edema, bilateral: Secondary | ICD-10-CM

## 2015-08-04 DIAGNOSIS — I10 Essential (primary) hypertension: Secondary | ICD-10-CM

## 2015-08-04 DIAGNOSIS — H2513 Age-related nuclear cataract, bilateral: Secondary | ICD-10-CM | POA: Diagnosis not present

## 2015-08-04 DIAGNOSIS — E11311 Type 2 diabetes mellitus with unspecified diabetic retinopathy with macular edema: Secondary | ICD-10-CM

## 2015-08-08 ENCOUNTER — Other Ambulatory Visit (INDEPENDENT_AMBULATORY_CARE_PROVIDER_SITE_OTHER): Payer: Commercial Managed Care - PPO

## 2015-08-08 DIAGNOSIS — Z0000 Encounter for general adult medical examination without abnormal findings: Secondary | ICD-10-CM | POA: Diagnosis not present

## 2015-08-08 LAB — CBC WITH DIFFERENTIAL/PLATELET
BASOS ABS: 0 10*3/uL (ref 0.0–0.1)
Basophils Relative: 0.4 % (ref 0.0–3.0)
EOS ABS: 0.2 10*3/uL (ref 0.0–0.7)
Eosinophils Relative: 2.9 % (ref 0.0–5.0)
HEMATOCRIT: 38.7 % — AB (ref 39.0–52.0)
HEMOGLOBIN: 12.9 g/dL — AB (ref 13.0–17.0)
LYMPHS PCT: 35.1 % (ref 12.0–46.0)
Lymphs Abs: 2.1 10*3/uL (ref 0.7–4.0)
MCHC: 33.5 g/dL (ref 30.0–36.0)
MCV: 87.9 fl (ref 78.0–100.0)
MONOS PCT: 7.3 % (ref 3.0–12.0)
Monocytes Absolute: 0.4 10*3/uL (ref 0.1–1.0)
Neutro Abs: 3.3 10*3/uL (ref 1.4–7.7)
Neutrophils Relative %: 54.3 % (ref 43.0–77.0)
Platelets: 272 10*3/uL (ref 150.0–400.0)
RBC: 4.4 Mil/uL (ref 4.22–5.81)
RDW: 15 % (ref 11.5–15.5)
WBC: 6.1 10*3/uL (ref 4.0–10.5)

## 2015-08-08 LAB — POC URINALSYSI DIPSTICK (AUTOMATED)
BILIRUBIN UA: NEGATIVE
Ketones, UA: NEGATIVE
Leukocytes, UA: NEGATIVE
NITRITE UA: NEGATIVE
RBC UA: NEGATIVE
SPEC GRAV UA: 1.025
Urobilinogen, UA: 0.2
pH, UA: 5.5

## 2015-08-08 LAB — LIPID PANEL
Cholesterol: 198 mg/dL (ref 0–200)
HDL: 33 mg/dL — ABNORMAL LOW (ref >39.00)
LDL CALC: 127 mg/dL — AB (ref 0–99)
NONHDL: 164.5
Total CHOL/HDL Ratio: 6
Triglycerides: 189 mg/dL — ABNORMAL HIGH (ref 0.0–149.0)
VLDL: 37.8 mg/dL (ref 0.0–40.0)

## 2015-08-08 LAB — BASIC METABOLIC PANEL
BUN: 16 mg/dL (ref 6–23)
CALCIUM: 9.4 mg/dL (ref 8.4–10.5)
CHLORIDE: 101 meq/L (ref 96–112)
CO2: 32 mEq/L (ref 19–32)
CREATININE: 1.2 mg/dL (ref 0.40–1.50)
GFR: 63.44 mL/min (ref >60.00)
Glucose, Bld: 134 mg/dL — ABNORMAL HIGH (ref 70–99)
Potassium: 4.7 mEq/L (ref 3.5–5.1)
SODIUM: 139 meq/L (ref 135–145)

## 2015-08-08 LAB — HEPATIC FUNCTION PANEL
ALK PHOS: 53 U/L (ref 39–117)
ALT: 26 U/L (ref 0–53)
AST: 24 U/L (ref 0–37)
Albumin: 4.2 g/dL (ref 3.5–5.2)
BILIRUBIN TOTAL: 0.8 mg/dL (ref 0.2–1.2)
Bilirubin, Direct: 0.1 mg/dL (ref 0.0–0.3)
Total Protein: 7.1 g/dL (ref 6.0–8.3)

## 2015-08-08 LAB — TSH: TSH: 4.13 u[IU]/mL (ref 0.35–4.50)

## 2015-08-08 LAB — MICROALBUMIN / CREATININE URINE RATIO
Creatinine,U: 207.3 mg/dL
MICROALB UR: 41.6 mg/dL — AB (ref 0.0–1.9)
Microalb Creat Ratio: 20.1 mg/g (ref 0.0–30.0)

## 2015-08-08 LAB — PSA: PSA: 0.38 ng/mL (ref 0.10–4.00)

## 2015-08-08 LAB — HEPATITIS C ANTIBODY: HCV Ab: NEGATIVE

## 2015-08-08 LAB — HEMOGLOBIN A1C: HEMOGLOBIN A1C: 7.5 % — AB (ref 4.6–6.5)

## 2015-08-15 ENCOUNTER — Encounter: Payer: Self-pay | Admitting: Family Medicine

## 2015-08-15 ENCOUNTER — Ambulatory Visit (INDEPENDENT_AMBULATORY_CARE_PROVIDER_SITE_OTHER): Payer: Commercial Managed Care - PPO | Admitting: Family Medicine

## 2015-08-15 VITALS — BP 124/82 | HR 108 | Temp 98.6°F | Ht 72.0 in | Wt 325.0 lb

## 2015-08-15 DIAGNOSIS — Z85828 Personal history of other malignant neoplasm of skin: Secondary | ICD-10-CM | POA: Insufficient documentation

## 2015-08-15 DIAGNOSIS — E785 Hyperlipidemia, unspecified: Secondary | ICD-10-CM | POA: Diagnosis not present

## 2015-08-15 DIAGNOSIS — R6889 Other general symptoms and signs: Secondary | ICD-10-CM

## 2015-08-15 DIAGNOSIS — Z23 Encounter for immunization: Secondary | ICD-10-CM | POA: Diagnosis not present

## 2015-08-15 DIAGNOSIS — R011 Cardiac murmur, unspecified: Secondary | ICD-10-CM | POA: Diagnosis not present

## 2015-08-15 DIAGNOSIS — Z0001 Encounter for general adult medical examination with abnormal findings: Secondary | ICD-10-CM | POA: Diagnosis not present

## 2015-08-15 DIAGNOSIS — Z87891 Personal history of nicotine dependence: Secondary | ICD-10-CM

## 2015-08-15 DIAGNOSIS — I10 Essential (primary) hypertension: Secondary | ICD-10-CM | POA: Diagnosis not present

## 2015-08-15 DIAGNOSIS — E1142 Type 2 diabetes mellitus with diabetic polyneuropathy: Secondary | ICD-10-CM

## 2015-08-15 NOTE — Patient Instructions (Addendum)
Final Pneumonia shot received today ([id number]).  Bring your blood pressure cuff to next visit  We will call you within a week about your referral for ultrasound to make sure no aneurysm andalso echocardiogram for your heart. If you do not hear within 2 weeks, give [place] a call.

## 2015-08-15 NOTE — Assessment & Plan Note (Signed)
mild poor control. Has had diarrhea on simvastatin, does not like atorvastatin for unclear reasons, myalgias on daily crestor 5mg . Patient not ready to retrial- consider livalo option in future

## 2015-08-15 NOTE — Assessment & Plan Note (Signed)
S: controlled on Losartan-hctz 100-12.5mg . Poor control at outside offices. At home can be elevated as well BP Readings from Last 3 Encounters:  08/15/15 124/82  07/11/15 171/89  06/16/15 183/96  A/P:Continue current meds:  Will bring his cuff to next visit. Wonder if cuff is too tight at other offices (may need larger size)

## 2015-08-15 NOTE — Assessment & Plan Note (Signed)
S: controlled with amitriptyline 50mg  in mainly feet A/P: continue current medication

## 2015-08-15 NOTE — Progress Notes (Signed)
[name], MD Phone: [phone]  Subjective:  Patient presents today for their annual physical. Chief complaint-noted.   See problem oriented charting- ROS- full  review of systems was completed and negative except for: some fatigue with activity but no shortness of breath. No chest pain.   The following were reviewed and entered/updated in epic: Past Medical History  Diagnosis Date  . Diabetes mellitus   . Hypertension    Patient Active Problem List   Diagnosis Date Noted  . DM (diabetes mellitus) type II uncontrolled with eye manifestation ([place]) 02/03/2007    Priority: High  . History of skin cancer 08/15/2015    Priority: Medium  . Diabetic retinopathy ([place]) 02/21/2015    Priority: Medium  . Hyperlipidemia 08/24/2014    Priority: Medium  . Diabetic polyneuropathy ([place]) 04/11/2009    Priority: Medium  . Essential hypertension 02/03/2007    Priority: Medium  . Former smoker 08/24/2014    Priority: Low  . Ingrowing toenail 07/19/2014    Priority: Low  . Obesity 04/12/2014    Priority: Low  . BACK PAIN, LUMBAR, WITH RADICULOPATHY 08/30/2008    Priority: Low  . Undiagnosed cardiac murmurs 08/15/2015   Past Surgical History  Procedure Laterality Date  . Hernia repair      >10 years ago  . Right shoulder surgery      around 2014    Family History  Problem Relation Age of Onset  . Hypertension Mother   . Heart disease Mother     CHF did not see doctor  . Diabetes Maternal Grandmother   . Diabetes Son     Medications- reviewed and updated Current Outpatient Prescriptions  Medication Sig Dispense Refill  . ACCU-CHEK COMPACT TEST DRUM test strip TEST AS DIRECTED 100 each 3  . amitriptyline (ELAVIL) 50 MG tablet TAKE 1 TABLET BY MOUTH AT BEDTIME 30 tablet 5  . colestipol (COLESTID) 1 g tablet Take 4 tablets (4 g total) by mouth daily. 120 tablet 11  . Folic Acid-Vit Q000111Q 123456 (FOLBEE) 2.5-25-1 MG TABS Take 1 tablet by mouth 2 (two) times daily.      Marland Kitchen  glucose blood test strip 100 each by Other route 3 (three) times daily. Use as instructed 100 each 12  . Insulin Glargine (LANTUS) 100 UNIT/ML Solostar Pen Inject 270 Units into the skin every morning. And pen needles 4/day 105 mL 11  . Insulin Pen Needle (EASY TOUCH PEN NEEDLES) 32G X 6 MM MISC Use 2 times daily 100 each 3  . losartan-hydrochlorothiazide (HYZAAR) 100-25 MG tablet Take 1 tablet by mouth daily. 30 tablet 11  . silver sulfADIAZINE (SILVADENE) 1 % cream Apply 1 application topically daily. 50 g 0   No current facility-administered medications for this visit.    Allergies-reviewed and updated Allergies  Allergen Reactions  . Simvastatin Diarrhea    Social History   Social History  . Marital Status: Married    Spouse Name: N/A  . Number of Children: N/A  . Years of Education: N/A   Social History Main Topics  . Smoking status: Former Smoker    Quit date: 04/29/2000  . Smokeless tobacco: Never Used     Comment: smoked for 10 years on and off  . Alcohol Use: 0.0 oz/week    0 Standard drinks or equivalent per week     Comment: 6 martinis a year  . Drug Use: No  . Sexual Activity: Yes   Other Topics Concern  . Not on file  Social History Narrative   Married. 3 kids. 7 grandkids. No greatgrandkids.       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 Oropharynx normal Neck: no thyromegaly CV: RRR- slightly tachycardic (104 on my check but regular. 2/6 SEM RUSB. no rubs or gallops Lungs: CTAB no crackles, wheeze, rhonchi Abdomen: soft/nontender/nondistended/normal bowel sounds. No rebound or guarding.  Rectal: normal tone, normal size prostate, no masses or tenderness Ext: 1+  edema Skin: warm, dry, no rash Neuro: grossly normal, moves all extremities, PERRLA  Assessment/Plan:  70 y.o. male presenting for annual physical.  Health Maintenance counseling: 1. Anticipatory guidance: Patient counseled regarding regular dental exams, eye exams, wearing seatbelts.  2. Risk factor reduction:  Advised patient of need for regular exercise and diet rich and fruits and vegetables to reduce risk of heart attack and stroke.  3. Immunizations/screenings/ancillary studies- needs AAA screen Immunization History  Administered Date(s) Administered  . Influenza Split 02/06/2011, 01/28/2012  . Influenza Whole 02/08/2010  . Influenza,inj,Quad PF,36+ Mos 02/21/2015  . Pneumococcal Conjugate-13 08/24/2014  . Pneumococcal Polysaccharide-23 07/18/2008, 08/15/2015  . Td 04/29/2004  . Tdap 08/24/2014  . Zoster 02/08/2010    4. Prostate cancer screening- low risk based off rectal and psa  Lab Results  Component Value Date   PSA 0.38 08/08/2015   PSA 0.42 04/05/2014   PSA 0.28 09/02/2012   5. Colon cancer screening - 09/22/2007 denies polyps 6. Skin cancer screening- Skin surgery center every 6 months. 2 recent removals from face.  7. Former smoker- no blood in urine. AAA screen advised and ordered  DM- followed by Dr. Loanne Drilling. Last a1c up some at 7.5 Retinopathy- followed by optho Very mild anemia- will follow, discussed early colonoscopy if dips any further OA- doing some aleve for L knee and hip  Hyperlipidemia mild poor control. Has had diarrhea on simvastatin, does not like atorvastatin for unclear reasons, myalgias on daily crestor 5mg . Patient not ready to retrial- consider livalo option in future  Essential hypertension S: controlled on Losartan-hctz 100-12.5mg . Poor control at outside offices. At home can be elevated as well BP Readings from Last 3 Encounters:  08/15/15 124/82  07/11/15 171/89  06/16/15 183/96  A/P:Continue current meds:  Will bring his cuff to  next visit. Wonder if cuff is too tight at other offices (may need larger size)   Diabetic polyneuropathy S: controlled with amitriptyline 50mg  in mainly feet A/P: continue current medication   Undiagnosed cardiac murmurs Get echocardiogram. Aortic sclerosis vs. Stenosis potentially? Apparently he had carotid dopplers done several years ago and thinks murmur may have been noted before- this is first time I have noted.    Return in about 6 months (around 02/14/2016). Return precautions advised.   Orders Placed This Encounter  Procedures  . Pneumococcal polysaccharide vaccine 23-valent greater than or equal to 2yo subcutaneous/IM

## 2015-08-15 NOTE — Assessment & Plan Note (Addendum)
S:Apparently he had carotid dopplers done several years ago and thinks murmur may have been noted before- this is first time I have noted. Has noted some more fatigue over recent months but no chest pain or shortness of breath. Does have 1+ edema unchanged. Occasionally hears a wooshing sound in ears once I describe the sound to him.  A/P:Get echocardiogram. Aortic sclerosis vs. Stenosis potentially?

## 2015-08-23 ENCOUNTER — Inpatient Hospital Stay (HOSPITAL_COMMUNITY): Admission: RE | Admit: 2015-08-23 | Payer: Commercial Managed Care - PPO | Source: Ambulatory Visit

## 2015-08-24 ENCOUNTER — Ambulatory Visit (HOSPITAL_COMMUNITY)
Admission: RE | Admit: 2015-08-24 | Discharge: 2015-08-24 | Disposition: A | Discharge status: Home / Self Care | Payer: Commercial Managed Care - PPO | Source: Ambulatory Visit | Attending: Family Medicine | Admitting: Family Medicine

## 2015-08-24 DIAGNOSIS — I10 Essential (primary) hypertension: Secondary | ICD-10-CM | POA: Diagnosis not present

## 2015-08-24 DIAGNOSIS — E119 Type 2 diabetes mellitus without complications: Secondary | ICD-10-CM | POA: Insufficient documentation

## 2015-08-24 DIAGNOSIS — Z87891 Personal history of nicotine dependence: Secondary | ICD-10-CM | POA: Diagnosis not present

## 2015-08-24 DIAGNOSIS — I723 Aneurysm of iliac artery: Secondary | ICD-10-CM | POA: Diagnosis not present

## 2015-08-30 ENCOUNTER — Ambulatory Visit: Payer: Commercial Managed Care - PPO | Admitting: Podiatry

## 2015-08-30 ENCOUNTER — Encounter: Payer: Self-pay | Admitting: Podiatry

## 2015-08-30 ENCOUNTER — Ambulatory Visit (INDEPENDENT_AMBULATORY_CARE_PROVIDER_SITE_OTHER): Payer: Commercial Managed Care - PPO | Admitting: Podiatry

## 2015-08-30 DIAGNOSIS — L97519 Non-pressure chronic ulcer of other part of right foot with unspecified severity: Principal | ICD-10-CM

## 2015-08-30 DIAGNOSIS — L89891 Pressure ulcer of other site, stage 1: Secondary | ICD-10-CM | POA: Diagnosis not present

## 2015-08-30 DIAGNOSIS — L97529 Non-pressure chronic ulcer of other part of left foot with unspecified severity: Secondary | ICD-10-CM

## 2015-08-30 DIAGNOSIS — E11621 Type 2 diabetes mellitus with foot ulcer: Secondary | ICD-10-CM

## 2015-08-30 NOTE — Progress Notes (Signed)
Subjective:     Patient ID: Martin Agee Sr., male   DOB: [birth year], 70 y.o.   MRN: [id number]  HPI this patient returns to the office follow-up for diabetic ulcer on his right hallux and under the outside ball of his left foot. He was seen 2 weeks  ago and given instructions for home soaks .Marland Kitchen He presents the office today stating that his toe is doing much better and the ulcer under the outside ball of his left foot is draining and open.  There is malodor noted.   He presents for continued evaluation and treatment.   Review of Systems     Objective:   Physical Exam GENERAL APPEARANCE: Alert, conversant. Appropriately groomed. No acute distress.  VASCULAR: Pedal pulses are not  palpable at  [facility] and PT bilateral.  Capillary refill time is immediate to all digits,  Normal temperature gradient.  Digital hair growth is present bilateral  NEUROLOGIC: sensation is diminished to 5.07 monofilament at 5/5 sites bilateral.  Light touch is intact bilateral, Muscle strength normal.  MUSCULOSKELETAL: acceptable muscle strength, tone and stability bilateral.  Intrinsic muscluature intact bilateral.  Rectus appearance of foot and digits noted bilateral.   DERMATOLOGIC: Ulcer on right hallux is healed.Marland Kitchen    Ulcer r sub 5th met left foot measures 10 x 10 mm with necrotic tissue noted..  No redness or swelling noted.  Digital nails are asymptomatic.       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     Plan:     ROV  Debride necrotic tissue both ulcers and DSD applied to ulcer sub 5th left foot.      Continue home soaks with betadine and told patient to wear surgical shoe... RTC 2 weeks To initiate diabetic footgear for diabetic neuropathy with previous ulcers and hallux limitus B/L.  Gardiner Barefoot DPM

## 2015-09-04 ENCOUNTER — Other Ambulatory Visit: Payer: Self-pay | Admitting: Endocrinology

## 2015-09-08 ENCOUNTER — Encounter (INDEPENDENT_AMBULATORY_CARE_PROVIDER_SITE_OTHER): Payer: Commercial Managed Care - PPO | Admitting: Ophthalmology

## 2015-09-08 ENCOUNTER — Ambulatory Visit: Payer: Commercial Managed Care - PPO | Admitting: Podiatry

## 2015-09-08 DIAGNOSIS — E113313 Type 2 diabetes mellitus with moderate nonproliferative diabetic retinopathy with macular edema, bilateral: Secondary | ICD-10-CM | POA: Diagnosis not present

## 2015-09-08 DIAGNOSIS — I10 Essential (primary) hypertension: Secondary | ICD-10-CM | POA: Diagnosis not present

## 2015-09-08 DIAGNOSIS — H35033 Hypertensive retinopathy, bilateral: Secondary | ICD-10-CM

## 2015-09-08 DIAGNOSIS — E11311 Type 2 diabetes mellitus with unspecified diabetic retinopathy with macular edema: Secondary | ICD-10-CM

## 2015-09-08 DIAGNOSIS — H43813 Vitreous degeneration, bilateral: Secondary | ICD-10-CM

## 2015-09-11 ENCOUNTER — Other Ambulatory Visit: Payer: Self-pay | Admitting: Internal Medicine

## 2015-09-11 ENCOUNTER — Ambulatory Visit (HOSPITAL_COMMUNITY)
Admission: RE | Admit: 2015-09-11 | Discharge: 2015-09-11 | Disposition: A | Discharge status: Home / Self Care | Payer: Commercial Managed Care - PPO | Source: Ambulatory Visit | Attending: Internal Medicine | Admitting: Internal Medicine

## 2015-09-11 ENCOUNTER — Encounter (HOSPITAL_BASED_OUTPATIENT_CLINIC_OR_DEPARTMENT_OTHER): Payer: Commercial Managed Care - PPO | Attending: Internal Medicine

## 2015-09-11 DIAGNOSIS — M7989 Other specified soft tissue disorders: Secondary | ICD-10-CM | POA: Insufficient documentation

## 2015-09-11 DIAGNOSIS — M7732 Calcaneal spur, left foot: Secondary | ICD-10-CM | POA: Insufficient documentation

## 2015-09-11 DIAGNOSIS — I10 Essential (primary) hypertension: Secondary | ICD-10-CM | POA: Diagnosis not present

## 2015-09-11 DIAGNOSIS — Z87891 Personal history of nicotine dependence: Secondary | ICD-10-CM | POA: Diagnosis not present

## 2015-09-11 DIAGNOSIS — L97529 Non-pressure chronic ulcer of other part of left foot with unspecified severity: Secondary | ICD-10-CM

## 2015-09-11 DIAGNOSIS — E11621 Type 2 diabetes mellitus with foot ulcer: Secondary | ICD-10-CM | POA: Insufficient documentation

## 2015-09-11 DIAGNOSIS — L97521 Non-pressure chronic ulcer of other part of left foot limited to breakdown of skin: Secondary | ICD-10-CM | POA: Diagnosis not present

## 2015-09-11 DIAGNOSIS — Z794 Long term (current) use of insulin: Secondary | ICD-10-CM | POA: Insufficient documentation

## 2015-09-11 DIAGNOSIS — L03032 Cellulitis of left toe: Secondary | ICD-10-CM | POA: Insufficient documentation

## 2015-09-11 DIAGNOSIS — M7752 Other enthesopathy of left foot: Secondary | ICD-10-CM | POA: Diagnosis not present

## 2015-09-11 DIAGNOSIS — L97522 Non-pressure chronic ulcer of other part of left foot with fat layer exposed: Secondary | ICD-10-CM | POA: Diagnosis not present

## 2015-09-11 DIAGNOSIS — E11319 Type 2 diabetes mellitus with unspecified diabetic retinopathy without macular edema: Secondary | ICD-10-CM | POA: Diagnosis not present

## 2015-09-11 DIAGNOSIS — E1140 Type 2 diabetes mellitus with diabetic neuropathy, unspecified: Secondary | ICD-10-CM | POA: Diagnosis not present

## 2015-09-13 ENCOUNTER — Ambulatory Visit: Payer: Commercial Managed Care - PPO | Admitting: Podiatry

## 2015-09-13 ENCOUNTER — Ambulatory Visit: Payer: Commercial Managed Care - PPO | Admitting: Endocrinology

## 2015-09-18 ENCOUNTER — Other Ambulatory Visit: Payer: Self-pay | Admitting: Internal Medicine

## 2015-09-18 DIAGNOSIS — S81802A Unspecified open wound, left lower leg, initial encounter: Secondary | ICD-10-CM

## 2015-09-18 DIAGNOSIS — E11621 Type 2 diabetes mellitus with foot ulcer: Secondary | ICD-10-CM | POA: Diagnosis not present

## 2015-09-22 DIAGNOSIS — E11621 Type 2 diabetes mellitus with foot ulcer: Secondary | ICD-10-CM | POA: Diagnosis not present

## 2015-09-27 ENCOUNTER — Ambulatory Visit
Admission: RE | Admit: 2015-09-27 | Discharge: 2015-09-27 | Disposition: A | Discharge status: Home / Self Care | Payer: Commercial Managed Care - PPO | Source: Ambulatory Visit | Attending: Internal Medicine | Admitting: Internal Medicine

## 2015-09-27 DIAGNOSIS — S81802A Unspecified open wound, left lower leg, initial encounter: Secondary | ICD-10-CM

## 2015-09-27 MED ORDER — GADOBENATE DIMEGLUMINE 529 MG/ML IV SOLN
20.0000 mL | Freq: Once | INTRAVENOUS | Status: AC | PRN
  Administered 2015-09-27: 20 mL via INTRAVENOUS

## 2015-10-02 ENCOUNTER — Encounter (HOSPITAL_BASED_OUTPATIENT_CLINIC_OR_DEPARTMENT_OTHER): Payer: Commercial Managed Care - PPO | Attending: Internal Medicine

## 2015-10-02 DIAGNOSIS — I10 Essential (primary) hypertension: Secondary | ICD-10-CM | POA: Diagnosis not present

## 2015-10-02 DIAGNOSIS — E1140 Type 2 diabetes mellitus with diabetic neuropathy, unspecified: Secondary | ICD-10-CM | POA: Diagnosis not present

## 2015-10-02 DIAGNOSIS — L97522 Non-pressure chronic ulcer of other part of left foot with fat layer exposed: Secondary | ICD-10-CM | POA: Insufficient documentation

## 2015-10-02 DIAGNOSIS — E11621 Type 2 diabetes mellitus with foot ulcer: Secondary | ICD-10-CM | POA: Diagnosis not present

## 2015-10-02 DIAGNOSIS — E11319 Type 2 diabetes mellitus with unspecified diabetic retinopathy without macular edema: Secondary | ICD-10-CM | POA: Insufficient documentation

## 2015-10-03 ENCOUNTER — Ambulatory Visit: Payer: Commercial Managed Care - PPO | Admitting: Endocrinology

## 2015-10-04 ENCOUNTER — Ambulatory Visit: Payer: Commercial Managed Care - PPO | Admitting: Podiatry

## 2015-10-05 DIAGNOSIS — E11621 Type 2 diabetes mellitus with foot ulcer: Secondary | ICD-10-CM | POA: Diagnosis not present

## 2015-10-09 DIAGNOSIS — E11621 Type 2 diabetes mellitus with foot ulcer: Secondary | ICD-10-CM | POA: Diagnosis not present

## 2015-10-13 ENCOUNTER — Encounter (INDEPENDENT_AMBULATORY_CARE_PROVIDER_SITE_OTHER): Payer: Commercial Managed Care - PPO | Admitting: Ophthalmology

## 2015-10-13 DIAGNOSIS — H35033 Hypertensive retinopathy, bilateral: Secondary | ICD-10-CM

## 2015-10-13 DIAGNOSIS — I10 Essential (primary) hypertension: Secondary | ICD-10-CM

## 2015-10-13 DIAGNOSIS — E11311 Type 2 diabetes mellitus with unspecified diabetic retinopathy with macular edema: Secondary | ICD-10-CM | POA: Diagnosis not present

## 2015-10-13 DIAGNOSIS — E113313 Type 2 diabetes mellitus with moderate nonproliferative diabetic retinopathy with macular edema, bilateral: Secondary | ICD-10-CM

## 2015-10-13 DIAGNOSIS — H43813 Vitreous degeneration, bilateral: Secondary | ICD-10-CM | POA: Diagnosis not present

## 2015-10-16 DIAGNOSIS — E11621 Type 2 diabetes mellitus with foot ulcer: Secondary | ICD-10-CM | POA: Diagnosis not present

## 2015-10-23 DIAGNOSIS — E11621 Type 2 diabetes mellitus with foot ulcer: Secondary | ICD-10-CM | POA: Diagnosis not present

## 2015-10-30 ENCOUNTER — Encounter (HOSPITAL_BASED_OUTPATIENT_CLINIC_OR_DEPARTMENT_OTHER): Payer: Commercial Managed Care - PPO | Attending: Internal Medicine

## 2015-10-30 DIAGNOSIS — L97521 Non-pressure chronic ulcer of other part of left foot limited to breakdown of skin: Secondary | ICD-10-CM | POA: Insufficient documentation

## 2015-10-30 DIAGNOSIS — E11621 Type 2 diabetes mellitus with foot ulcer: Secondary | ICD-10-CM | POA: Insufficient documentation

## 2015-10-30 DIAGNOSIS — I10 Essential (primary) hypertension: Secondary | ICD-10-CM | POA: Diagnosis not present

## 2015-10-30 DIAGNOSIS — L03032 Cellulitis of left toe: Secondary | ICD-10-CM | POA: Diagnosis not present

## 2015-10-30 DIAGNOSIS — E1142 Type 2 diabetes mellitus with diabetic polyneuropathy: Secondary | ICD-10-CM | POA: Diagnosis not present

## 2015-10-31 ENCOUNTER — Other Ambulatory Visit: Payer: Self-pay | Admitting: Endocrinology

## 2015-11-06 DIAGNOSIS — E11621 Type 2 diabetes mellitus with foot ulcer: Secondary | ICD-10-CM | POA: Diagnosis not present

## 2015-11-10 ENCOUNTER — Encounter (INDEPENDENT_AMBULATORY_CARE_PROVIDER_SITE_OTHER): Payer: Commercial Managed Care - PPO | Admitting: Ophthalmology

## 2015-11-10 DIAGNOSIS — H43813 Vitreous degeneration, bilateral: Secondary | ICD-10-CM

## 2015-11-10 DIAGNOSIS — E11311 Type 2 diabetes mellitus with unspecified diabetic retinopathy with macular edema: Secondary | ICD-10-CM | POA: Diagnosis not present

## 2015-11-10 DIAGNOSIS — I10 Essential (primary) hypertension: Secondary | ICD-10-CM | POA: Diagnosis not present

## 2015-11-10 DIAGNOSIS — E113313 Type 2 diabetes mellitus with moderate nonproliferative diabetic retinopathy with macular edema, bilateral: Secondary | ICD-10-CM

## 2015-11-10 DIAGNOSIS — H35033 Hypertensive retinopathy, bilateral: Secondary | ICD-10-CM

## 2015-11-15 ENCOUNTER — Ambulatory Visit (INDEPENDENT_AMBULATORY_CARE_PROVIDER_SITE_OTHER): Payer: Commercial Managed Care - PPO | Admitting: Endocrinology

## 2015-11-15 ENCOUNTER — Ambulatory Visit: Payer: Commercial Managed Care - PPO | Admitting: Endocrinology

## 2015-11-15 ENCOUNTER — Encounter: Payer: Self-pay | Admitting: Endocrinology

## 2015-11-15 VITALS — BP 140/76 | HR 103 | Ht 72.0 in | Wt 316.0 lb

## 2015-11-15 DIAGNOSIS — E1165 Type 2 diabetes mellitus with hyperglycemia: Secondary | ICD-10-CM | POA: Diagnosis not present

## 2015-11-15 DIAGNOSIS — E1139 Type 2 diabetes mellitus with other diabetic ophthalmic complication: Secondary | ICD-10-CM

## 2015-11-15 DIAGNOSIS — IMO0002 Reserved for concepts with insufficient information to code with codable children: Secondary | ICD-10-CM

## 2015-11-15 LAB — POCT GLYCOSYLATED HEMOGLOBIN (HGB A1C): HEMOGLOBIN A1C: 7.2

## 2015-11-15 NOTE — Progress Notes (Signed)
Subjective:    Patient ID: Martin Agee Sr., male    DOB: [birth year], 70 y.o.   MRN: [id number]  HPI The state of at least three ongoing medical problems is addressed today, with interval history of each noted here:  Pt returns for f/u of diabetes mellitus: DM type: Insulin-requiring type 2.  Dx'ed: [id number] Complications: polyneuropathy and retinopathy.   Therapy: insulin since 2013.  DKA: never.   Severe hypoglycemia: never.  Pancreatitis: never.   Other: he chose to change to a qd insulin regimen, after poor results with multiple daily injections.  Interval history: no cbg record, but states cbg's vary from 60-204, but most are in the 100's.  It is in general higher as the day goes on. pt states he feels well in general.  He denies hypoglycemia.   Past Medical History  Diagnosis Date  . Diabetes mellitus   . Hypertension     Past Surgical History  Procedure Laterality Date  . Hernia repair      >10 years ago  . Right shoulder surgery      around 2014    Social History   Social History  . Marital Status: Married    Spouse Name: N/A  . Number of Children: N/A  . Years of Education: N/A   Occupational History  . Not on file.   Social History Main Topics  . Smoking status: Former Smoker    Quit date: 04/29/2000  . Smokeless tobacco: Never Used     Comment: smoked for 10 years on and off  . Alcohol Use: 0.0 oz/week    0 Standard drinks or equivalent per week     Comment: 6 martinis a year  . Drug Use: No  . Sexual Activity: Yes   Other Topics Concern  . Not on file   Social History Narrative   Married. 3 kids. 7 grandkids. No greatgrandkids.       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 ACCU-CHEK COMPACT TEST DRUM test strip TEST AS DIRECTED 100 each 3  . amitriptyline (ELAVIL) 50 MG tablet  TAKE 1 TABLET BY MOUTH AT BEDTIME 30 tablet 5  . colestipol (COLESTID) 1 g tablet Take 4 tablets (4 g total) by mouth daily. 120 tablet 11  . Folic Acid-Vit Q000111Q 123456 (FOLBEE) 2.5-25-1 MG TABS Take 1 tablet by mouth 2 (two) times daily.      Marland Kitchen glucose blood test strip 100 each by Other route 3 (three) times daily. Use as instructed 100 each 12  . Insulin Glargine (LANTUS) 100 UNIT/ML Solostar Pen Inject 270 Units into the skin every morning. And pen needles 4/day 105 mL 11  . Insulin Pen Needle (EASY TOUCH PEN NEEDLES) 32G X 6 MM MISC Use 2 times daily 100 each 3  . losartan-hydrochlorothiazide (HYZAAR) 100-25 MG tablet Take 1 tablet by mouth daily. 30 tablet 11  . silver sulfADIAZINE (SILVADENE) 1 % cream Apply 1 application topically daily. 50 g 0   No current facility-administered medications on file prior to visit.    Allergies  Allergen Reactions  . Simvastatin Diarrhea    Family History  Problem Relation Age of Onset  . Hypertension Mother   . Heart disease Mother     CHF did not see doctor  . Diabetes Maternal Grandmother   .  Diabetes Son     BP 140/76 mmHg  Pulse 103  Ht 6' (1.829 m)  Wt 316 lb (143.337 kg)  BMI 42.85 kg/m2  SpO2 96%   Review of Systems Denies LOC.      Objective:   Physical Exam VITAL SIGNS: See vs page.    GENERAL: no distress Pulses: dorsalis pedis intact bilat.  MSK: no deformity of the feet CV: 1+ bilat leg edema Skin: no open ulcer on the feet. Healed ulcer at the plantar aspect of the left foot. normal color and temp on the feet. Neuro: sensation is intact to touch on the feet, but decreased from normal.   A1c=7.2%    Assessment & Plan:  Insulin-requiring type 2 DM: this is the best control this pt should aim for, given this regimen, which does match insulin to his changing needs throughout the day.   Obesity, persistent  Patient is advised the following: Patient Instructions  check your blood sugar twice a day.  vary the  time of day when you check, between before the 3 meals, and at bedtime.  also check if you have symptoms of your blood sugar being too high or too low.  please keep a record of the readings and bring it to your next appointment here.  You can write it on any piece of paper.  please call us sooner if your blood sugar goes below 70, or if you have a lot of readings over 200.  It is especially important to check at bedtime.   Please continue to pursue the weight loss surgery.     Please continue the same insulin.   Please come back for a follow-up appointment in 4 months.     Renato Shin, MD

## 2015-11-15 NOTE — Patient Instructions (Addendum)
check your blood sugar twice a day.  vary the time of day when you check, between before the 3 meals, and at bedtime.  also check if you have symptoms of your blood sugar being too high or too low.  please keep a record of the readings and bring it to your next appointment here.  You can write it on any piece of paper.  please call us sooner if your blood sugar goes below 70, or if you have a lot of readings over 200.  It is especially important to check at bedtime.   Please continue to pursue the weight loss surgery.     Please continue the same insulin.   Please come back for a follow-up appointment in 4 months.

## 2015-12-15 ENCOUNTER — Encounter (INDEPENDENT_AMBULATORY_CARE_PROVIDER_SITE_OTHER): Payer: Commercial Managed Care - PPO | Admitting: Ophthalmology

## 2015-12-15 DIAGNOSIS — I10 Essential (primary) hypertension: Secondary | ICD-10-CM | POA: Diagnosis not present

## 2015-12-15 DIAGNOSIS — E11311 Type 2 diabetes mellitus with unspecified diabetic retinopathy with macular edema: Secondary | ICD-10-CM

## 2015-12-15 DIAGNOSIS — H43813 Vitreous degeneration, bilateral: Secondary | ICD-10-CM | POA: Diagnosis not present

## 2015-12-15 DIAGNOSIS — H35033 Hypertensive retinopathy, bilateral: Secondary | ICD-10-CM

## 2015-12-15 DIAGNOSIS — H2513 Age-related nuclear cataract, bilateral: Secondary | ICD-10-CM | POA: Diagnosis not present

## 2015-12-15 DIAGNOSIS — E113313 Type 2 diabetes mellitus with moderate nonproliferative diabetic retinopathy with macular edema, bilateral: Secondary | ICD-10-CM | POA: Diagnosis not present

## 2016-01-11 ENCOUNTER — Encounter: Payer: Self-pay | Admitting: Family Medicine

## 2016-01-11 ENCOUNTER — Ambulatory Visit (INDEPENDENT_AMBULATORY_CARE_PROVIDER_SITE_OTHER): Payer: Commercial Managed Care - PPO | Admitting: Family Medicine

## 2016-01-11 VITALS — BP 150/82 | HR 97 | Temp 98.3°F | Wt 318.2 lb

## 2016-01-11 DIAGNOSIS — I10 Essential (primary) hypertension: Secondary | ICD-10-CM | POA: Diagnosis not present

## 2016-01-11 DIAGNOSIS — L03116 Cellulitis of left lower limb: Secondary | ICD-10-CM

## 2016-01-11 MED ORDER — CEPHALEXIN 500 MG PO CAPS: 500.0000 mg | ORAL_CAPSULE | Freq: Four times a day (QID) | ORAL | 0 refills | Status: DC

## 2016-01-11 MED ORDER — VALSARTAN-HYDROCHLOROTHIAZIDE 320-25 MG PO TABS: 1.0000 | ORAL_TABLET | Freq: Every day | ORAL | 5 refills | Status: DC

## 2016-01-11 NOTE — Patient Instructions (Signed)
Start keflex 4x a day for cellulitis. If within 48 hours you are not improving or develop fever- try to get into our Saturday clinic or urgent care as may need a switch in course of antibiotics. Glad you have wound care visit  Change losartan-HCTZ to max dose valsartn-HCTZ (you can wait until legs are better if you want)- then let's recheck blood pressure a few weeks after. Suspect you may need additional support  Flu shot when healed from current illness

## 2016-01-11 NOTE — Progress Notes (Signed)
Pre visit review using our clinic review tool, if applicable. No additional management support is needed unless otherwise documented below in the visit note. 

## 2016-01-12 ENCOUNTER — Encounter (INDEPENDENT_AMBULATORY_CARE_PROVIDER_SITE_OTHER): Payer: Commercial Managed Care - PPO | Admitting: Ophthalmology

## 2016-01-12 DIAGNOSIS — I10 Essential (primary) hypertension: Secondary | ICD-10-CM | POA: Diagnosis not present

## 2016-01-12 DIAGNOSIS — H43813 Vitreous degeneration, bilateral: Secondary | ICD-10-CM

## 2016-01-12 DIAGNOSIS — E113313 Type 2 diabetes mellitus with moderate nonproliferative diabetic retinopathy with macular edema, bilateral: Secondary | ICD-10-CM

## 2016-01-12 DIAGNOSIS — E11311 Type 2 diabetes mellitus with unspecified diabetic retinopathy with macular edema: Secondary | ICD-10-CM

## 2016-01-12 DIAGNOSIS — H35033 Hypertensive retinopathy, bilateral: Secondary | ICD-10-CM

## 2016-01-12 NOTE — Progress Notes (Signed)
Subjective:  Martin DEUTSCHER Sr. is a 70 y.o. year old very pleasant male patient who presents for/with See problem oriented charting ROS- no fever, chills, nausea, vomiting, some fatigue. No chest pain. No headache or blurry vision.  Has had expanding redness.see any ROS included in HPI as well.   Past Medical History-  Patient Active Problem List   Diagnosis Date Noted  . DM (diabetes mellitus) type II uncontrolled with eye manifestation ([place]) 02/03/2007    Priority: High  . History of skin cancer 08/15/2015    Priority: Medium  . Diabetic retinopathy ([place]) 02/21/2015    Priority: Medium  . Hyperlipidemia 08/24/2014    Priority: Medium  . Diabetic polyneuropathy ([place]) 04/11/2009    Priority: Medium  . Essential hypertension 02/03/2007    Priority: Medium  . Former smoker 08/24/2014    Priority: Low  . Ingrowing toenail 07/19/2014    Priority: Low  . Obesity 04/12/2014    Priority: Low  . BACK PAIN, LUMBAR, WITH RADICULOPATHY 08/30/2008    Priority: Low  . Undiagnosed cardiac murmurs 08/15/2015    Medications- reviewed and updated Current Outpatient Prescriptions  Medication Sig Dispense Refill  . ACCU-CHEK COMPACT TEST DRUM test strip TEST AS DIRECTED 100 each 3  . amitriptyline (ELAVIL) 50 MG tablet TAKE 1 TABLET BY MOUTH AT BEDTIME 30 tablet 5  . Folic Acid-Vit Q000111Q 123456 (FOLBEE) 2.5-25-1 MG TABS Take 1 tablet by mouth 2 (two) times daily.      Marland Kitchen glucose blood test strip 100 each by Other route 3 (three) times daily. Use as instructed 100 each 12  . Insulin Glargine (LANTUS) 100 UNIT/ML Solostar Pen Inject 270 Units into the skin every morning. And pen needles 4/day 105 mL 11  . Insulin Pen Needle (EASY TOUCH PEN NEEDLES) 32G X 6 MM MISC Use 2 times daily 100 each 3  . silver sulfADIAZINE (SILVADENE) 1 % cream Apply 1 application topically daily. 50 g 0  . cephALEXin (KEFLEX) 500 MG capsule Take 1 capsule (500 mg total) by mouth 4 (four) times daily. 56 capsule 0  .  valsartan-hydrochlorothiazide (DIOVAN-HCT) 320-25 MG tablet Take 1 tablet by mouth daily. 30 tablet 5   No current facility-administered medications for this visit.     Objective: BP (!) 150/82 (BP Location: Left Arm, Patient Position: Sitting, Cuff Size: Large)   Pulse 97   Temp 98.3 F (36.8 C) (Oral)   Wt (!) 318 lb 3.2 oz (144.3 kg)   SpO2 98%   BMI 43.16 kg/m  Gen: NAD, resting comfortably CV: RRR no murmurs rubs or gallops Lungs: CTAB no crackles, wheeze, rhonchi Ext: 2+ edema left leg, 1 + on right On left lateral foot plantar area 1.5 x 1.5 cm wound with good granulation tissue- no surrounding erythema. On left leg there is erythema, warmth, mild tendreness streaking from ankle to about 10 cm below tibial plateau.  Skin: warm, dry Neuro: grossly normal, moves all extremities  Assessment/Plan:  Cellulitis of leg, left  S: chronic wound reopoened about 2 weeks ago on left foot- over last week has noted erythema on left ankle that has now expanded up to nearly knee. Slightly warm to touch. Tender to touch but not severe. Swelling has increased in that leg from baseline. Had one small wound on left calf that has been slowly healing and not more intensely red and was not originating point for expanding redness A/P: concern is for cellulitis of left lower leg in diabetic with potential entry  point of diabetic ulcer. We will start on keflex and he sees wound care center in 1 week for follow up of wound but we discussed sooner return precautions in case symptoms are worsening or if failing to improve within 48 hours of antibiotics.   Essential hypertension S: controlled poorly on Losartan-hctz 100-25mg  BP Readings from Last 3 Encounters:  01/11/16 (!) 150/82  11/15/15 140/76  08/15/15 124/82  A/P: trial max dose valsartan HCT instead- follow up within a few weeks for recheck   Prn here since has wound care center folllow up  Meds ordered this encounter  Medications  .  cephALEXin (KEFLEX) 500 MG capsule    Sig: Take 1 capsule (500 mg total) by mouth 4 (four) times daily.    Dispense:  56 capsule    Refill:  0  . valsartan-hydrochlorothiazide (DIOVAN-HCT) 320-25 MG tablet    Sig: Take 1 tablet by mouth daily.    Dispense:  30 tablet    Refill:  5    Return precautions advised.  [name], MD

## 2016-01-12 NOTE — Assessment & Plan Note (Signed)
S: controlled poorly on Losartan-hctz 100-25mg  BP Readings from Last 3 Encounters:  01/11/16 (!) 150/82  11/15/15 140/76  08/15/15 124/82  A/P: trial max dose valsartan HCT instead- follow up within a few weeks for recheck

## 2016-01-18 ENCOUNTER — Encounter (HOSPITAL_BASED_OUTPATIENT_CLINIC_OR_DEPARTMENT_OTHER): Payer: Commercial Managed Care - PPO | Attending: Internal Medicine

## 2016-01-18 DIAGNOSIS — E1142 Type 2 diabetes mellitus with diabetic polyneuropathy: Secondary | ICD-10-CM | POA: Diagnosis not present

## 2016-01-18 DIAGNOSIS — I10 Essential (primary) hypertension: Secondary | ICD-10-CM | POA: Diagnosis not present

## 2016-01-18 DIAGNOSIS — E11621 Type 2 diabetes mellitus with foot ulcer: Secondary | ICD-10-CM | POA: Insufficient documentation

## 2016-01-18 DIAGNOSIS — L97521 Non-pressure chronic ulcer of other part of left foot limited to breakdown of skin: Secondary | ICD-10-CM | POA: Diagnosis not present

## 2016-01-25 DIAGNOSIS — E11621 Type 2 diabetes mellitus with foot ulcer: Secondary | ICD-10-CM | POA: Diagnosis not present

## 2016-01-28 ENCOUNTER — Other Ambulatory Visit: Payer: Self-pay | Admitting: Family Medicine

## 2016-02-01 ENCOUNTER — Encounter (HOSPITAL_BASED_OUTPATIENT_CLINIC_OR_DEPARTMENT_OTHER): Payer: Commercial Managed Care - PPO | Attending: Internal Medicine

## 2016-02-01 DIAGNOSIS — I10 Essential (primary) hypertension: Secondary | ICD-10-CM | POA: Diagnosis not present

## 2016-02-01 DIAGNOSIS — E11621 Type 2 diabetes mellitus with foot ulcer: Secondary | ICD-10-CM | POA: Diagnosis not present

## 2016-02-01 DIAGNOSIS — E1140 Type 2 diabetes mellitus with diabetic neuropathy, unspecified: Secondary | ICD-10-CM | POA: Diagnosis not present

## 2016-02-01 DIAGNOSIS — E1136 Type 2 diabetes mellitus with diabetic cataract: Secondary | ICD-10-CM | POA: Insufficient documentation

## 2016-02-01 DIAGNOSIS — L97421 Non-pressure chronic ulcer of left heel and midfoot limited to breakdown of skin: Secondary | ICD-10-CM | POA: Diagnosis not present

## 2016-02-08 DIAGNOSIS — E11621 Type 2 diabetes mellitus with foot ulcer: Secondary | ICD-10-CM | POA: Diagnosis not present

## 2016-02-09 ENCOUNTER — Encounter (INDEPENDENT_AMBULATORY_CARE_PROVIDER_SITE_OTHER): Payer: Commercial Managed Care - PPO | Admitting: Ophthalmology

## 2016-02-09 DIAGNOSIS — E11311 Type 2 diabetes mellitus with unspecified diabetic retinopathy with macular edema: Secondary | ICD-10-CM

## 2016-02-09 DIAGNOSIS — H43813 Vitreous degeneration, bilateral: Secondary | ICD-10-CM

## 2016-02-09 DIAGNOSIS — E113313 Type 2 diabetes mellitus with moderate nonproliferative diabetic retinopathy with macular edema, bilateral: Secondary | ICD-10-CM

## 2016-02-09 DIAGNOSIS — I10 Essential (primary) hypertension: Secondary | ICD-10-CM

## 2016-02-09 DIAGNOSIS — H35033 Hypertensive retinopathy, bilateral: Secondary | ICD-10-CM | POA: Diagnosis not present

## 2016-02-14 ENCOUNTER — Ambulatory Visit (INDEPENDENT_AMBULATORY_CARE_PROVIDER_SITE_OTHER): Payer: Commercial Managed Care - PPO | Admitting: Family Medicine

## 2016-02-14 ENCOUNTER — Encounter: Payer: Self-pay | Admitting: Family Medicine

## 2016-02-14 ENCOUNTER — Other Ambulatory Visit: Payer: Self-pay | Admitting: Endocrinology

## 2016-02-14 VITALS — BP 132/79 | HR 79 | Temp 98.1°F | Wt 315.8 lb

## 2016-02-14 DIAGNOSIS — Z23 Encounter for immunization: Secondary | ICD-10-CM

## 2016-02-14 DIAGNOSIS — E785 Hyperlipidemia, unspecified: Secondary | ICD-10-CM

## 2016-02-14 DIAGNOSIS — E1142 Type 2 diabetes mellitus with diabetic polyneuropathy: Secondary | ICD-10-CM | POA: Diagnosis not present

## 2016-02-14 DIAGNOSIS — I10 Essential (primary) hypertension: Secondary | ICD-10-CM

## 2016-02-14 DIAGNOSIS — R011 Cardiac murmur, unspecified: Secondary | ICD-10-CM

## 2016-02-14 MED ORDER — PITAVASTATIN CALCIUM 1 MG PO TABS: 1.0000 mg | ORAL_TABLET | Freq: Every day | ORAL | 5 refills | Status: DC

## 2016-02-14 NOTE — Assessment & Plan Note (Signed)
S: mild poorly controlled on no statin. No myalgias.  Lab Results  Component Value Date   CHOL 198 08/08/2015   HDL 33.00 (L) 08/08/2015   LDLCALC 127 (H) 08/08/2015   LDLDIRECT 158.0 04/05/2014   TRIG 189.0 (H) 08/08/2015   CHOLHDL 6 08/08/2015   A/P: trial livalo. Titrate up per AVS

## 2016-02-14 NOTE — Assessment & Plan Note (Addendum)
S: controlled in feet with amitriptyline 50mg  A/P: continue current medication. Follow up with endocrine- last a1c reasonable at 7.2

## 2016-02-14 NOTE — Progress Notes (Signed)
Subjective:  Martin FAYNE Sr. is a 70 y.o. year old very pleasant male patient who presents for/with See problem oriented charting ROS- No chest pain or shortness of breath. No headache or blurry vision. No palpitations.see any ROS included in HPI as well.   Past Medical History-  Patient Active Problem List   Diagnosis Date Noted  . Undiagnosed cardiac murmurs 08/15/2015    Priority: High  . Morbid obesity ([place]) 04/12/2014    Priority: High  . DM (diabetes mellitus) type II uncontrolled with eye manifestation ([place]) 02/03/2007    Priority: High  . History of skin cancer 08/15/2015    Priority: Medium  . Diabetic retinopathy ([place]) 02/21/2015    Priority: Medium  . Hyperlipidemia 08/24/2014    Priority: Medium  . Diabetic polyneuropathy ([place]) 04/11/2009    Priority: Medium  . Essential hypertension 02/03/2007    Priority: Medium  . Former smoker 08/24/2014    Priority: Low  . Ingrowing toenail 07/19/2014    Priority: Low  . BACK PAIN, LUMBAR, WITH RADICULOPATHY 08/30/2008    Priority: Low    Medications- reviewed and updated Current Outpatient Prescriptions  Medication Sig Dispense Refill  . ACCU-CHEK COMPACT TEST DRUM test strip TEST AS DIRECTED 100 each 3  . amitriptyline (ELAVIL) 50 MG tablet TAKE 1 TABLET BY MOUTH AT BEDTIME 30 tablet 5  . Folic Acid-Vit Q000111Q 123456 (FOLBEE) 2.5-25-1 MG TABS Take 1 tablet by mouth 2 (two) times daily.      Marland Kitchen glucose blood test strip 100 each by Other route 3 (three) times daily. Use as instructed 100 each 12  . Insulin Glargine (LANTUS) 100 UNIT/ML Solostar Pen Inject 270 Units into the skin every morning. And pen needles 4/day 105 mL 11  . Insulin Pen Needle (EASY TOUCH PEN NEEDLES) 32G X 6 MM MISC Use 2 times daily 100 each 3  . silver sulfADIAZINE (SILVADENE) 1 % cream Apply 1 application topically daily. 50 g 0  . valsartan-hydrochlorothiazide (DIOVAN-HCT) 320-25 MG tablet Take 1 tablet by mouth daily. 30 tablet 5  . Pitavastatin  Calcium (LIVALO) 1 MG TABS Take 1 tablet (1 mg total) by mouth daily. 30 tablet 5   No current facility-administered medications for this visit.     Objective: BP 132/79 (BP Location: Left Arm, Patient Position: Sitting, Cuff Size: Large)   Pulse 79   Temp 98.1 F (36.7 C) (Oral)   Wt (!) 315 lb 12.8 oz (143.2 kg)   SpO2 98%   BMI 42.83 kg/m  Gen: NAD, resting comfortably CV: RRR. 2/6 SEM. no rubs or gallops Lungs: CTAB no crackles, wheeze, rhonchi Abdomen: soft/nontender/nondistended/normal bowel sounds. No rebound or guarding.  Ext: 1+ pitting edema Skin: warm, dry Neuro: grossly normal, moves all extremities  Assessment/Plan:  Hyperlipidemia S: mild poorly controlled on no statin. No myalgias.  Lab Results  Component Value Date   CHOL 198 08/08/2015   HDL 33.00 (L) 08/08/2015   LDLCALC 127 (H) 08/08/2015   LDLDIRECT 158.0 04/05/2014   TRIG 189.0 (H) 08/08/2015   CHOLHDL 6 08/08/2015   A/P: trial livalo. Titrate up per AVS  Essential hypertension S: poorly controlled on losartan hctz 100-25mg  previously now on valsartan-hctz 320-25mg  BP Readings from Last 3 Encounters:  02/14/16 132/79  01/11/16 (!) 150/82  11/15/15 140/76  A/P:Continue current meds:  Doing much better  Diabetic polyneuropathy S: controlled in feet with amitriptyline 50mg  A/P: continue current medication. Follow up with endocrine- last a1c reasonable at 7.2  Undiagnosed cardiac murmurs  S: persistent low level murmur systolic. NO SOB or CP A/P: will get echocardiogram to evaluate for valvular disease   Morbid obesity ([place]) S: weight down just a few lbs Wt Readings from Last 3 Encounters:  02/14/16 (!) 315 lb 12.8 oz (143.2 kg)  01/11/16 (!) 318 lb 3.2 oz (144.3 kg)  11/15/15 (!) 316 lb (143.3 kg)  A/P: discussed more weight loss needed. Encouraged need for healthy eating, regular exercise, weight loss.    6 months CPE (no previsit labs as will likely be on medicare at that  point)  Orders Placed This Encounter  Procedures  . Flu vaccine HIGH DOSE PF  . ECHOCARDIOGRAM COMPLETE    Standing Status:   Future    Standing Expiration Date:   05/16/2017    Order Specific Question:   Where should this test be performed    Answer:   [name])    Order Specific Question:   Does the patient weigh less than or greater than 250 lbs?    Answer:   Patient weighs greater than 250 lbs    Order Specific Question:   Complete or Limited study?    Answer:   Complete    Order Specific Question:   With Image Enhancing Agent or without Image Enhancing Agent?    Answer:   With Image Enhancing Agent    Order Specific Question:   Reason for exam-Echo    Answer:   Murmur  785.2 / R01.1    Meds ordered this encounter  Medications  . Pitavastatin Calcium (LIVALO) 1 MG TABS    Sig: Take 1 tablet (1 mg total) by mouth daily.    Dispense:  30 tablet    Refill:  5    Return precautions advised.  [name], MD

## 2016-02-14 NOTE — Assessment & Plan Note (Signed)
S: weight down just a few lbs Wt Readings from Last 3 Encounters:  02/14/16 (!) 315 lb 12.8 oz (143.2 kg)  01/11/16 (!) 318 lb 3.2 oz (144.3 kg)  11/15/15 (!) 316 lb (143.3 kg)  A/P: discussed more weight loss needed. Encouraged need for healthy eating, regular exercise, weight loss.

## 2016-02-14 NOTE — Progress Notes (Signed)
Pre visit review using our clinic review tool, if applicable. No additional management support is needed unless otherwise documented below in the visit note. 

## 2016-02-14 NOTE — Patient Instructions (Addendum)
Flu shot today  Livalo for cholesterol 1 pill a week for 2 weeks then increase weekly by 1 pill until on daily basis. Send me a message if you get to a point you cannot tolerate this.   more weight loss needed. Need healthy eating, regular exercise, weight loss. Good job dropping 3 lbs though  We will call you within a week about your referral for echocardiogram. If you do not hear within 2 weeks, give [place] a call.

## 2016-02-14 NOTE — Assessment & Plan Note (Signed)
S: poorly controlled on losartan hctz 100-25mg  previously now on valsartan-hctz 320-25mg  BP Readings from Last 3 Encounters:  02/14/16 132/79  01/11/16 (!) 150/82  11/15/15 140/76  A/P:Continue current meds:  Doing much better

## 2016-02-14 NOTE — Assessment & Plan Note (Signed)
S: persistent low level murmur systolic. NO SOB or CP A/P: will get echocardiogram to evaluate for valvular disease

## 2016-02-15 DIAGNOSIS — E11621 Type 2 diabetes mellitus with foot ulcer: Secondary | ICD-10-CM | POA: Diagnosis not present

## 2016-02-16 ENCOUNTER — Other Ambulatory Visit: Payer: Self-pay

## 2016-02-16 MED ORDER — INSULIN PEN NEEDLE 32G X 6 MM MISC
3 refills | Status: DC
Start: 2016-02-16 — End: 2016-09-02

## 2016-02-19 ENCOUNTER — Telehealth: Payer: Self-pay | Admitting: Endocrinology

## 2016-02-19 ENCOUNTER — Telehealth: Payer: Self-pay

## 2016-02-19 NOTE — Telephone Encounter (Signed)
Received fax, placed on Dr.Ellison's desk for him to look over and decide to do PA or not.

## 2016-02-19 NOTE — Telephone Encounter (Signed)
Patient is calling on the status of insurance co. faxed over a form concerning his medication. Please advise

## 2016-02-21 ENCOUNTER — Telehealth: Payer: Self-pay | Admitting: Endocrinology

## 2016-02-21 DIAGNOSIS — Z7689 Persons encountering health services in other specified circumstances: Secondary | ICD-10-CM

## 2016-02-21 NOTE — Telephone Encounter (Signed)
Pt called and was wondering if the PA form was done for his insulin yet.  He would like a call back to know what is going on with his prescription.

## 2016-02-21 NOTE — Telephone Encounter (Signed)
I contacted the patient and advised we have submitted the PA for the Lantus. Patient advised we are waiting on a call back from the insurance company to verify if the PA was accepted. Patient was advised we would contact him once we here back from the insurance company. Pateint voiced understanding.

## 2016-02-28 NOTE — Telephone Encounter (Signed)
Patient stated hi b/s over 300 and is out of his LANTUS SOLOSTAR 100 UNIT/ML Solostar Pen  send to  [id number] - [place], [place] [phone] (Phone) [phone] (Fax)

## 2016-02-28 NOTE — Telephone Encounter (Signed)
Please ask pt to call and see what the status of the PA is

## 2016-02-28 NOTE — Telephone Encounter (Signed)
I contacted the patient and advised him to please contact his insurance and obtain the status of the PA for the Lantus. Requested a call back from the patient once he has spoken to his insurance company.

## 2016-02-28 NOTE — Telephone Encounter (Signed)
See message and please advise, Thanks! The lantus has been denied because of the amount the patient is prescribed and has not been approved yet. PA was submitted on 02/21/2016.

## 2016-03-15 ENCOUNTER — Encounter (INDEPENDENT_AMBULATORY_CARE_PROVIDER_SITE_OTHER): Payer: Commercial Managed Care - PPO | Admitting: Ophthalmology

## 2016-03-15 DIAGNOSIS — H35033 Hypertensive retinopathy, bilateral: Secondary | ICD-10-CM

## 2016-03-15 DIAGNOSIS — E11311 Type 2 diabetes mellitus with unspecified diabetic retinopathy with macular edema: Secondary | ICD-10-CM

## 2016-03-15 DIAGNOSIS — I10 Essential (primary) hypertension: Secondary | ICD-10-CM | POA: Diagnosis not present

## 2016-03-15 DIAGNOSIS — E113313 Type 2 diabetes mellitus with moderate nonproliferative diabetic retinopathy with macular edema, bilateral: Secondary | ICD-10-CM

## 2016-03-15 DIAGNOSIS — H43813 Vitreous degeneration, bilateral: Secondary | ICD-10-CM | POA: Diagnosis not present

## 2016-03-17 NOTE — Progress Notes (Signed)
Subjective:    Patient ID: Martin Agee Sr., male    DOB: [birth year], 70 y.o.   MRN: [id number]  HPI Pt returns for f/u of diabetes mellitus: DM type: Insulin-requiring type 2.  Dx'ed: 123XX123 Complications: polyneuropathy, foot ulcer, and retinopathy.   Therapy: insulin since 2013.  DKA: never.   Severe hypoglycemia: never.  Pancreatitis: never.   Other: he chose to change to a qd insulin regimen, after poor results with multiple daily injections; he declines weight loss surgery.   Interval history: no cbg record, but states cbg's vary from 75-200's.  There is no trend throughout the day.  pt states he feels well in general.  He misses insulin x 2 weeks, a few weeks ago, due to ins co denying it.    Past Medical History:  Diagnosis Date  . Diabetes mellitus   . Hypertension     Past Surgical History:  Procedure Laterality Date  . HERNIA REPAIR     >10 years ago  . right shoulder surgery     around 2014    Social History   Social History  . Marital status: Married    Spouse name: N/A  . Number of children: N/A  . Years of education: N/A   Occupational History  . Not on file.   Social History Main Topics  . Smoking status: Former Smoker    Quit date: 04/29/2000  . Smokeless tobacco: Never Used     Comment: smoked for 10 years on and off  . Alcohol use 0.0 oz/week     Comment: 6 martinis a year  . Drug use: No  . Sexual activity: Yes   Other Topics Concern  . Not on file   Social History Narrative   Married. 3 kids. 7 grandkids. No greatgrandkids.       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 ACCU-CHEK COMPACT TEST DRUM test strip TEST AS DIRECTED 100 each 3  . amitriptyline (ELAVIL) 50 MG tablet TAKE 1 TABLET BY MOUTH AT BEDTIME 30 tablet 5  . Folic Acid-Vit Q000111Q 123456 (FOLBEE) 2.5-25-1 MG  TABS Take 1 tablet by mouth 2 (two) times daily.      Marland Kitchen glucose blood test strip 100 each by Other route 3 (three) times daily. Use as instructed 100 each 12  . Insulin Glargine (LANTUS) 100 UNIT/ML Solostar Pen Inject 270 Units into the skin every morning. And pen needles 4/day 105 mL 11  . Insulin Pen Needle (EASY TOUCH PEN NEEDLES) 32G X 6 MM MISC Use 2 times daily 100 each 3  . LANTUS SOLOSTAR 100 UNIT/ML Solostar Pen INJECT 300 UNITS INTO THE SKIN EVERY MORNING. 105 mL 11  . Pitavastatin Calcium (LIVALO) 1 MG TABS Take 1 tablet (1 mg total) by mouth daily. 30 tablet 5  . silver sulfADIAZINE (SILVADENE) 1 % cream Apply 1 application topically daily. 50 g 0  . valsartan-hydrochlorothiazide (DIOVAN-HCT) 320-25 MG tablet Take 1 tablet by mouth daily. 30 tablet 5   No current facility-administered medications on file prior to visit.     Allergies  Allergen Reactions  . Simvastatin Diarrhea    Family History  Problem Relation Age of Onset  . Hypertension Mother   . Heart disease Mother     CHF did not see doctor  . Diabetes Maternal Grandmother   .  Diabetes Son     BP 136/84   Pulse (!) 107   Ht 6' (1.829 m)   Wt (!) 321 lb (145.6 kg)   SpO2 95%   BMI 43.54 kg/m    Review of Systems He denies hypoglycemia    Objective:   Physical Exam VITAL SIGNS: See vs page.    GENERAL: no distress Pulses: dorsalis pedis intact bilat.  MSK: no deformity of the feet CV: 2+ bilat leg edema. Skin: no ulcer on the feet. normal color and temp on the feet.  Neuro: sensation is intact to touch on the feet, but severely decreased from normal.    A1c=8.3%    Assessment & Plan:  Insulin-requiring type 2 DM, with retinopathy, worse due to missing insulin Patient is advised the following: Patient Instructions  check your blood sugar twice a day.  vary the time of day when you check, between before the 3 meals, and at bedtime.  also check if you have symptoms of your blood sugar being  too high or too low.  please keep a record of the readings and bring it to your next appointment here.  You can write it on any piece of paper.  please call us sooner if your blood sugar goes below 70, or if you have a lot of readings over 200.  It is especially important to check at bedtime.   Please continue the same insulin.   Please come back for a follow-up appointment in 3 months.

## 2016-03-18 ENCOUNTER — Ambulatory Visit (INDEPENDENT_AMBULATORY_CARE_PROVIDER_SITE_OTHER): Payer: Commercial Managed Care - PPO | Admitting: Endocrinology

## 2016-03-18 ENCOUNTER — Encounter: Payer: Self-pay | Admitting: Endocrinology

## 2016-03-18 VITALS — BP 136/84 | HR 107 | Ht 72.0 in | Wt 321.0 lb

## 2016-03-18 DIAGNOSIS — E11311 Type 2 diabetes mellitus with unspecified diabetic retinopathy with macular edema: Secondary | ICD-10-CM | POA: Diagnosis not present

## 2016-03-18 DIAGNOSIS — Z794 Long term (current) use of insulin: Secondary | ICD-10-CM | POA: Diagnosis not present

## 2016-03-18 DIAGNOSIS — E1165 Type 2 diabetes mellitus with hyperglycemia: Secondary | ICD-10-CM

## 2016-03-18 LAB — POCT GLYCOSYLATED HEMOGLOBIN (HGB A1C): Hemoglobin A1C: 8.3

## 2016-03-18 NOTE — Addendum Note (Signed)
Addended by: Verlin Grills T on: 03/18/2016 11:08 AM   Modules accepted: Orders

## 2016-03-18 NOTE — Patient Instructions (Addendum)
check your blood sugar twice a day.  vary the time of day when you check, between before the 3 meals, and at bedtime.  also check if you have symptoms of your blood sugar being too high or too low.  please keep a record of the readings and bring it to your next appointment here.  You can write it on any piece of paper.  please call us sooner if your blood sugar goes below 70, or if you have a lot of readings over 200.  It is especially important to check at bedtime.   Please continue the same insulin.   Please come back for a follow-up appointment in 3 months.

## 2016-04-12 ENCOUNTER — Encounter (INDEPENDENT_AMBULATORY_CARE_PROVIDER_SITE_OTHER): Payer: Commercial Managed Care - PPO | Admitting: Ophthalmology

## 2016-04-12 DIAGNOSIS — E11311 Type 2 diabetes mellitus with unspecified diabetic retinopathy with macular edema: Secondary | ICD-10-CM | POA: Diagnosis not present

## 2016-04-12 DIAGNOSIS — H35033 Hypertensive retinopathy, bilateral: Secondary | ICD-10-CM | POA: Diagnosis not present

## 2016-04-12 DIAGNOSIS — I10 Essential (primary) hypertension: Secondary | ICD-10-CM | POA: Diagnosis not present

## 2016-04-12 DIAGNOSIS — H43813 Vitreous degeneration, bilateral: Secondary | ICD-10-CM

## 2016-04-12 DIAGNOSIS — E113313 Type 2 diabetes mellitus with moderate nonproliferative diabetic retinopathy with macular edema, bilateral: Secondary | ICD-10-CM

## 2016-05-10 ENCOUNTER — Encounter (INDEPENDENT_AMBULATORY_CARE_PROVIDER_SITE_OTHER): Payer: Commercial Managed Care - PPO | Admitting: Ophthalmology

## 2016-05-10 DIAGNOSIS — I10 Essential (primary) hypertension: Secondary | ICD-10-CM | POA: Diagnosis not present

## 2016-05-10 DIAGNOSIS — H35033 Hypertensive retinopathy, bilateral: Secondary | ICD-10-CM

## 2016-05-10 DIAGNOSIS — H43813 Vitreous degeneration, bilateral: Secondary | ICD-10-CM

## 2016-05-10 DIAGNOSIS — E11311 Type 2 diabetes mellitus with unspecified diabetic retinopathy with macular edema: Secondary | ICD-10-CM | POA: Diagnosis not present

## 2016-05-10 DIAGNOSIS — H2513 Age-related nuclear cataract, bilateral: Secondary | ICD-10-CM

## 2016-05-10 DIAGNOSIS — E113313 Type 2 diabetes mellitus with moderate nonproliferative diabetic retinopathy with macular edema, bilateral: Secondary | ICD-10-CM | POA: Diagnosis not present

## 2016-05-15 ENCOUNTER — Ambulatory Visit (HOSPITAL_COMMUNITY): Payer: Commercial Managed Care - PPO

## 2016-05-22 DIAGNOSIS — L821 Other seborrheic keratosis: Secondary | ICD-10-CM | POA: Diagnosis not present

## 2016-05-22 DIAGNOSIS — L570 Actinic keratosis: Secondary | ICD-10-CM | POA: Diagnosis not present

## 2016-05-22 DIAGNOSIS — L814 Other melanin hyperpigmentation: Secondary | ICD-10-CM | POA: Diagnosis not present

## 2016-05-22 DIAGNOSIS — D1801 Hemangioma of skin and subcutaneous tissue: Secondary | ICD-10-CM | POA: Diagnosis not present

## 2016-05-29 ENCOUNTER — Ambulatory Visit (HOSPITAL_COMMUNITY)
Admission: RE | Admit: 2016-05-29 | Discharge: 2016-05-29 | Disposition: A | Discharge status: Home / Self Care | Payer: Commercial Managed Care - PPO | Source: Ambulatory Visit | Attending: Family Medicine | Admitting: Family Medicine

## 2016-05-29 DIAGNOSIS — I071 Rheumatic tricuspid insufficiency: Secondary | ICD-10-CM | POA: Diagnosis not present

## 2016-05-29 DIAGNOSIS — E119 Type 2 diabetes mellitus without complications: Secondary | ICD-10-CM | POA: Insufficient documentation

## 2016-05-29 DIAGNOSIS — I10 Essential (primary) hypertension: Secondary | ICD-10-CM | POA: Insufficient documentation

## 2016-05-29 DIAGNOSIS — Z87891 Personal history of nicotine dependence: Secondary | ICD-10-CM | POA: Diagnosis not present

## 2016-05-29 DIAGNOSIS — R011 Cardiac murmur, unspecified: Secondary | ICD-10-CM

## 2016-05-29 MED ORDER — PERFLUTREN LIPID MICROSPHERE
1.0000 mL | INTRAVENOUS | Status: AC | PRN
  Administered 2016-05-29: 2 mL via INTRAVENOUS
  Filled 2016-05-29: qty 10

## 2016-05-29 NOTE — Progress Notes (Signed)
  Echocardiogram 2D Echocardiogram with Definity has been performed.  Tresa Res 05/29/2016, 12:18 PM

## 2016-06-07 ENCOUNTER — Encounter (INDEPENDENT_AMBULATORY_CARE_PROVIDER_SITE_OTHER): Payer: Commercial Managed Care - PPO | Admitting: Ophthalmology

## 2016-06-07 DIAGNOSIS — H35033 Hypertensive retinopathy, bilateral: Secondary | ICD-10-CM | POA: Diagnosis not present

## 2016-06-07 DIAGNOSIS — H43813 Vitreous degeneration, bilateral: Secondary | ICD-10-CM | POA: Diagnosis not present

## 2016-06-07 DIAGNOSIS — H2513 Age-related nuclear cataract, bilateral: Secondary | ICD-10-CM | POA: Diagnosis not present

## 2016-06-07 DIAGNOSIS — I10 Essential (primary) hypertension: Secondary | ICD-10-CM | POA: Diagnosis not present

## 2016-06-07 DIAGNOSIS — E11311 Type 2 diabetes mellitus with unspecified diabetic retinopathy with macular edema: Secondary | ICD-10-CM | POA: Diagnosis not present

## 2016-06-07 DIAGNOSIS — E113313 Type 2 diabetes mellitus with moderate nonproliferative diabetic retinopathy with macular edema, bilateral: Secondary | ICD-10-CM | POA: Diagnosis not present

## 2016-06-18 ENCOUNTER — Ambulatory Visit (INDEPENDENT_AMBULATORY_CARE_PROVIDER_SITE_OTHER): Payer: Commercial Managed Care - PPO | Admitting: Endocrinology

## 2016-06-18 ENCOUNTER — Encounter: Payer: Self-pay | Admitting: Endocrinology

## 2016-06-18 VITALS — BP 132/80 | HR 102 | Ht 72.0 in | Wt 315.0 lb

## 2016-06-18 DIAGNOSIS — E11311 Type 2 diabetes mellitus with unspecified diabetic retinopathy with macular edema: Secondary | ICD-10-CM | POA: Diagnosis not present

## 2016-06-18 DIAGNOSIS — E1165 Type 2 diabetes mellitus with hyperglycemia: Secondary | ICD-10-CM | POA: Diagnosis not present

## 2016-06-18 DIAGNOSIS — Z794 Long term (current) use of insulin: Secondary | ICD-10-CM

## 2016-06-18 LAB — POCT GLYCOSYLATED HEMOGLOBIN (HGB A1C): HEMOGLOBIN A1C: 8

## 2016-06-18 MED ORDER — INSULIN GLARGINE 100 UNIT/ML SOLOSTAR PEN: 320.0000 [IU] | PEN_INJECTOR | SUBCUTANEOUS | 11 refills | Status: DC

## 2016-06-18 NOTE — Progress Notes (Signed)
Subjective:    Patient ID: Martin [name], male    DOB: 1946/01/19, 71 y.o.   MRN: [id number]  HPI Pt returns for f/u of diabetes mellitus: DM type: Insulin-requiring type 2.  Dx'ed: 123XX123 Complications: polyneuropathy, foot ulcer, and retinopathy.  Therapy: insulin since 2013.  DKA: never.   Severe hypoglycemia: never.  Pancreatitis: never.   Other: he chose to change to a qd insulin regimen, after poor results with multiple daily injections; he declines weight loss surgery.   Interval history: he says he seldom has hypoglycemia, and these episodes are mild.  This happens with missing a meal.  He misses the insulin approx once per 6 weeks.   Past Medical History:  Diagnosis Date  . Diabetes mellitus   . Hypertension     Past Surgical History:  Procedure Laterality Date  . HERNIA REPAIR     >10 years ago  . right shoulder surgery     around 2014    Social History   Social History  . Marital status: Married    Spouse name: N/A  . Number of children: N/A  . Years of education: N/A   Occupational History  . Not on file.   Social History Main Topics  . Smoking status: Former Smoker    Quit date: 04/29/2000  . Smokeless tobacco: Never Used     Comment: smoked for 10 years on and off  . Alcohol use 0.0 oz/week     Comment: 6 martinis a year  . Drug use: No  . Sexual activity: Yes   Other Topics Concern  . Not on file   Social History Narrative   Married. 3 kids. 7 grandkids. No greatgrandkids.       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 ACCU-CHEK COMPACT TEST DRUM test strip TEST AS DIRECTED 100 each 3  . amitriptyline (ELAVIL) 50 MG tablet TAKE 1 TABLET BY MOUTH AT BEDTIME 30 tablet 5  . Folic Acid-Vit Q000111Q 123456 (FOLBEE) 2.5-25-1 MG TABS Take 1 tablet by mouth 2 (two) times daily.      Marland Kitchen  glucose blood test strip 100 each by Other route 3 (three) times daily. Use as instructed 100 each 12  . Insulin Pen Needle (EASY TOUCH PEN NEEDLES) 32G X 6 MM MISC Use 2 times daily 100 each 3  . Pitavastatin Calcium (LIVALO) 1 MG TABS Take 1 tablet (1 mg total) by mouth daily. 30 tablet 5  . silver sulfADIAZINE (SILVADENE) 1 % cream Apply 1 application topically daily. 50 g 0  . valsartan-hydrochlorothiazide (DIOVAN-HCT) 320-25 MG tablet Take 1 tablet by mouth daily. 30 tablet 5   No current facility-administered medications on file prior to visit.     Allergies  Allergen Reactions  . Simvastatin Diarrhea    Family History  Problem Relation Age of Onset  . Hypertension Mother   . Heart disease Mother     CHF did not see doctor  . Diabetes Maternal Grandmother   . Diabetes Son     BP 132/80   Pulse (!) 102   Ht 6' (1.829 m)   Wt (!) 315 lb (142.9 kg)   SpO2 95%   BMI 42.72 kg/m    Review of Systems He denies LOC.      Objective:   Physical Exam VITAL SIGNS:  See vs page.    GENERAL: no distress.   Pulses: dorsalis pedis intact bilat. MSK: no deformity of the feet.  CV: 2+ bilat leg edema.  Skin: no ulcer on the feet. normal color and temp on the feet.  Neuro: sensation is intact to touch on the feet, but severely decreased from normal.    A1c=8.0%.      Assessment & Plan:  Insulin-requiring type 2 DM, with retinopathy: he needs increased rx.   Patient is advised the following: Patient Instructions  check your blood sugar twice a day.  vary the time of day when you check, between before the 3 meals, and at bedtime.  also check if you have symptoms of your blood sugar being too high or too low.  please keep a record of the readings and bring it to your next appointment here.  You can write it on any piece of paper.  please call us sooner if your blood sugar goes below 70, or if you have a lot of readings over 200.  It is especially important to check at bedtime.    Please increase the lantus to 320 units each morning.  lease come back for a follow-up appointment in 3 months.

## 2016-06-18 NOTE — Patient Instructions (Addendum)
check your blood sugar twice a day.  vary the time of day when you check, between before the 3 meals, and at bedtime.  also check if you have symptoms of your blood sugar being too high or too low.  please keep a record of the readings and bring it to your next appointment here.  You can write it on any piece of paper.  please call us sooner if your blood sugar goes below 70, or if you have a lot of readings over 200.  It is especially important to check at bedtime.   Please increase the lantus to 320 units each morning.  lease come back for a follow-up appointment in 3 months.

## 2016-07-02 ENCOUNTER — Encounter (INDEPENDENT_AMBULATORY_CARE_PROVIDER_SITE_OTHER): Payer: Medicare Other | Admitting: Ophthalmology

## 2016-07-02 ENCOUNTER — Other Ambulatory Visit: Payer: Self-pay | Admitting: Family Medicine

## 2016-07-03 ENCOUNTER — Encounter (INDEPENDENT_AMBULATORY_CARE_PROVIDER_SITE_OTHER): Payer: PPO | Admitting: Ophthalmology

## 2016-07-03 DIAGNOSIS — E113313 Type 2 diabetes mellitus with moderate nonproliferative diabetic retinopathy with macular edema, bilateral: Secondary | ICD-10-CM

## 2016-07-03 DIAGNOSIS — H2513 Age-related nuclear cataract, bilateral: Secondary | ICD-10-CM

## 2016-07-03 DIAGNOSIS — E11311 Type 2 diabetes mellitus with unspecified diabetic retinopathy with macular edema: Secondary | ICD-10-CM

## 2016-07-03 DIAGNOSIS — H43813 Vitreous degeneration, bilateral: Secondary | ICD-10-CM

## 2016-07-03 DIAGNOSIS — H35033 Hypertensive retinopathy, bilateral: Secondary | ICD-10-CM | POA: Diagnosis not present

## 2016-07-03 DIAGNOSIS — I10 Essential (primary) hypertension: Secondary | ICD-10-CM | POA: Diagnosis not present

## 2016-07-05 DIAGNOSIS — H2513 Age-related nuclear cataract, bilateral: Secondary | ICD-10-CM | POA: Diagnosis not present

## 2016-07-05 DIAGNOSIS — H18411 Arcus senilis, right eye: Secondary | ICD-10-CM | POA: Diagnosis not present

## 2016-07-05 DIAGNOSIS — E113319 Type 2 diabetes mellitus with moderate nonproliferative diabetic retinopathy with macular edema, unspecified eye: Secondary | ICD-10-CM | POA: Diagnosis not present

## 2016-07-05 DIAGNOSIS — I10 Essential (primary) hypertension: Secondary | ICD-10-CM | POA: Diagnosis not present

## 2016-07-05 DIAGNOSIS — H02839 Dermatochalasis of unspecified eye, unspecified eyelid: Secondary | ICD-10-CM | POA: Diagnosis not present

## 2016-07-05 DIAGNOSIS — H2511 Age-related nuclear cataract, right eye: Secondary | ICD-10-CM | POA: Diagnosis not present

## 2016-08-05 ENCOUNTER — Other Ambulatory Visit: Payer: Self-pay | Admitting: Family Medicine

## 2016-08-08 ENCOUNTER — Encounter (INDEPENDENT_AMBULATORY_CARE_PROVIDER_SITE_OTHER): Payer: PPO | Admitting: Ophthalmology

## 2016-08-08 DIAGNOSIS — H43813 Vitreous degeneration, bilateral: Secondary | ICD-10-CM

## 2016-08-08 DIAGNOSIS — H2513 Age-related nuclear cataract, bilateral: Secondary | ICD-10-CM | POA: Diagnosis not present

## 2016-08-08 DIAGNOSIS — E11311 Type 2 diabetes mellitus with unspecified diabetic retinopathy with macular edema: Secondary | ICD-10-CM | POA: Diagnosis not present

## 2016-08-08 DIAGNOSIS — I10 Essential (primary) hypertension: Secondary | ICD-10-CM | POA: Diagnosis not present

## 2016-08-08 DIAGNOSIS — E113313 Type 2 diabetes mellitus with moderate nonproliferative diabetic retinopathy with macular edema, bilateral: Secondary | ICD-10-CM | POA: Diagnosis not present

## 2016-08-08 DIAGNOSIS — H35033 Hypertensive retinopathy, bilateral: Secondary | ICD-10-CM

## 2016-08-21 ENCOUNTER — Telehealth: Payer: Self-pay | Admitting: Endocrinology

## 2016-08-21 ENCOUNTER — Encounter: Payer: Self-pay | Admitting: Family Medicine

## 2016-08-21 NOTE — Telephone Encounter (Signed)
Do you mean taking the full 320 units of lantus daily, and the humalog 3 times a day (just before each meal)?

## 2016-08-21 NOTE — Telephone Encounter (Signed)
Patient called to report blood sugar readings. Patient stated recently his blood sugars have not been controlled with the Lantus. Patient stated he did an experiment with his humalog, patient stated he has been taking 50 units of the humalog with each meal and stated his sugars have been under 200 each time he has checked it. Patient wanted to know if you would agree with him continuing this regimen?

## 2016-08-22 NOTE — Telephone Encounter (Signed)
Patient stated he has discontinued the lantus completely and is only taking 50 units of the humalog at each meal.

## 2016-08-22 NOTE — Telephone Encounter (Signed)
Ok, Please come back for a follow-up appointment in 2-3 weeks.

## 2016-08-22 NOTE — Telephone Encounter (Signed)
Patient notified of MD's response via voicemail. Patient advised to call back to schedule his appointment.

## 2016-08-26 DIAGNOSIS — Z9841 Cataract extraction status, right eye: Secondary | ICD-10-CM | POA: Diagnosis not present

## 2016-08-26 DIAGNOSIS — H2511 Age-related nuclear cataract, right eye: Secondary | ICD-10-CM | POA: Diagnosis not present

## 2016-08-26 DIAGNOSIS — Z961 Presence of intraocular lens: Secondary | ICD-10-CM | POA: Diagnosis not present

## 2016-08-26 DIAGNOSIS — H5201 Hypermetropia, right eye: Secondary | ICD-10-CM | POA: Diagnosis not present

## 2016-08-26 DIAGNOSIS — H52201 Unspecified astigmatism, right eye: Secondary | ICD-10-CM | POA: Diagnosis not present

## 2016-08-26 DIAGNOSIS — H52221 Regular astigmatism, right eye: Secondary | ICD-10-CM | POA: Diagnosis not present

## 2016-08-27 DIAGNOSIS — H2512 Age-related nuclear cataract, left eye: Secondary | ICD-10-CM | POA: Diagnosis not present

## 2016-09-02 ENCOUNTER — Other Ambulatory Visit: Payer: Self-pay | Admitting: Family Medicine

## 2016-09-05 ENCOUNTER — Encounter (INDEPENDENT_AMBULATORY_CARE_PROVIDER_SITE_OTHER): Payer: PPO | Admitting: Ophthalmology

## 2016-09-05 DIAGNOSIS — H2512 Age-related nuclear cataract, left eye: Secondary | ICD-10-CM

## 2016-09-05 DIAGNOSIS — H35033 Hypertensive retinopathy, bilateral: Secondary | ICD-10-CM

## 2016-09-05 DIAGNOSIS — E113313 Type 2 diabetes mellitus with moderate nonproliferative diabetic retinopathy with macular edema, bilateral: Secondary | ICD-10-CM

## 2016-09-05 DIAGNOSIS — I10 Essential (primary) hypertension: Secondary | ICD-10-CM | POA: Diagnosis not present

## 2016-09-05 DIAGNOSIS — E11311 Type 2 diabetes mellitus with unspecified diabetic retinopathy with macular edema: Secondary | ICD-10-CM | POA: Diagnosis not present

## 2016-09-05 DIAGNOSIS — H43813 Vitreous degeneration, bilateral: Secondary | ICD-10-CM

## 2016-09-16 DIAGNOSIS — H5202 Hypermetropia, left eye: Secondary | ICD-10-CM | POA: Diagnosis not present

## 2016-09-16 DIAGNOSIS — H2512 Age-related nuclear cataract, left eye: Secondary | ICD-10-CM | POA: Diagnosis not present

## 2016-09-16 DIAGNOSIS — Z961 Presence of intraocular lens: Secondary | ICD-10-CM | POA: Diagnosis not present

## 2016-09-16 DIAGNOSIS — H52222 Regular astigmatism, left eye: Secondary | ICD-10-CM | POA: Diagnosis not present

## 2016-09-16 DIAGNOSIS — Z9849 Cataract extraction status, unspecified eye: Secondary | ICD-10-CM | POA: Diagnosis not present

## 2016-09-17 ENCOUNTER — Ambulatory Visit: Payer: Medicare Other | Admitting: Endocrinology

## 2016-09-18 ENCOUNTER — Emergency Department (HOSPITAL_COMMUNITY): Payer: PPO

## 2016-09-18 ENCOUNTER — Inpatient Hospital Stay (HOSPITAL_COMMUNITY)
Admission: EM | Admit: 2016-09-18 | Discharge: 2016-09-26 | DRG: 871 | Disposition: A | Discharge status: Home / Self Care | Payer: PPO | Attending: Internal Medicine | Admitting: Internal Medicine

## 2016-09-18 ENCOUNTER — Encounter (HOSPITAL_COMMUNITY): Payer: Self-pay

## 2016-09-18 DIAGNOSIS — R112 Nausea with vomiting, unspecified: Secondary | ICD-10-CM | POA: Diagnosis not present

## 2016-09-18 DIAGNOSIS — A419 Sepsis, unspecified organism: Secondary | ICD-10-CM | POA: Diagnosis not present

## 2016-09-18 DIAGNOSIS — Z8701 Personal history of pneumonia (recurrent): Secondary | ICD-10-CM

## 2016-09-18 DIAGNOSIS — I700 Atherosclerosis of aorta: Secondary | ICD-10-CM | POA: Diagnosis not present

## 2016-09-18 DIAGNOSIS — A414 Sepsis due to anaerobes: Principal | ICD-10-CM | POA: Diagnosis present

## 2016-09-18 DIAGNOSIS — A0472 Enterocolitis due to Clostridium difficile, not specified as recurrent: Secondary | ICD-10-CM | POA: Diagnosis not present

## 2016-09-18 DIAGNOSIS — Z794 Long term (current) use of insulin: Secondary | ICD-10-CM

## 2016-09-18 DIAGNOSIS — E785 Hyperlipidemia, unspecified: Secondary | ICD-10-CM | POA: Diagnosis present

## 2016-09-18 DIAGNOSIS — I5032 Chronic diastolic (congestive) heart failure: Secondary | ICD-10-CM | POA: Diagnosis not present

## 2016-09-18 DIAGNOSIS — J690 Pneumonitis due to inhalation of food and vomit: Secondary | ICD-10-CM | POA: Diagnosis present

## 2016-09-18 DIAGNOSIS — I10 Essential (primary) hypertension: Secondary | ICD-10-CM | POA: Diagnosis present

## 2016-09-18 DIAGNOSIS — I110 Hypertensive heart disease with heart failure: Secondary | ICD-10-CM | POA: Diagnosis present

## 2016-09-18 DIAGNOSIS — R339 Retention of urine, unspecified: Secondary | ICD-10-CM | POA: Diagnosis not present

## 2016-09-18 DIAGNOSIS — J189 Pneumonia, unspecified organism: Secondary | ICD-10-CM

## 2016-09-18 DIAGNOSIS — R059 Cough, unspecified: Secondary | ICD-10-CM

## 2016-09-18 DIAGNOSIS — Z833 Family history of diabetes mellitus: Secondary | ICD-10-CM | POA: Diagnosis not present

## 2016-09-18 DIAGNOSIS — Z87891 Personal history of nicotine dependence: Secondary | ICD-10-CM | POA: Diagnosis not present

## 2016-09-18 DIAGNOSIS — E1165 Type 2 diabetes mellitus with hyperglycemia: Secondary | ICD-10-CM | POA: Diagnosis present

## 2016-09-18 DIAGNOSIS — N179 Acute kidney failure, unspecified: Secondary | ICD-10-CM | POA: Diagnosis not present

## 2016-09-18 DIAGNOSIS — I152 Hypertension secondary to endocrine disorders: Secondary | ICD-10-CM | POA: Diagnosis present

## 2016-09-18 DIAGNOSIS — Z9842 Cataract extraction status, left eye: Secondary | ICD-10-CM

## 2016-09-18 DIAGNOSIS — R918 Other nonspecific abnormal finding of lung field: Secondary | ICD-10-CM | POA: Diagnosis not present

## 2016-09-18 DIAGNOSIS — R05 Cough: Secondary | ICD-10-CM

## 2016-09-18 DIAGNOSIS — E1142 Type 2 diabetes mellitus with diabetic polyneuropathy: Secondary | ICD-10-CM | POA: Diagnosis not present

## 2016-09-18 DIAGNOSIS — Z888 Allergy status to other drugs, medicaments and biological substances status: Secondary | ICD-10-CM | POA: Diagnosis not present

## 2016-09-18 DIAGNOSIS — Z8249 Family history of ischemic heart disease and other diseases of the circulatory system: Secondary | ICD-10-CM | POA: Diagnosis not present

## 2016-09-18 DIAGNOSIS — E11311 Type 2 diabetes mellitus with unspecified diabetic retinopathy with macular edema: Secondary | ICD-10-CM

## 2016-09-18 DIAGNOSIS — Z6841 Body Mass Index (BMI) 40.0 and over, adult: Secondary | ICD-10-CM | POA: Diagnosis not present

## 2016-09-18 DIAGNOSIS — K529 Noninfective gastroenteritis and colitis, unspecified: Secondary | ICD-10-CM | POA: Diagnosis not present

## 2016-09-18 DIAGNOSIS — E1139 Type 2 diabetes mellitus with other diabetic ophthalmic complication: Secondary | ICD-10-CM | POA: Diagnosis present

## 2016-09-18 DIAGNOSIS — R9431 Abnormal electrocardiogram [ECG] [EKG]: Secondary | ICD-10-CM | POA: Diagnosis not present

## 2016-09-18 DIAGNOSIS — E860 Dehydration: Secondary | ICD-10-CM | POA: Diagnosis not present

## 2016-09-18 DIAGNOSIS — E1159 Type 2 diabetes mellitus with other circulatory complications: Secondary | ICD-10-CM | POA: Diagnosis present

## 2016-09-18 DIAGNOSIS — R197 Diarrhea, unspecified: Secondary | ICD-10-CM | POA: Diagnosis not present

## 2016-09-18 LAB — CBC WITH DIFFERENTIAL/PLATELET
BASOS PCT: 0 %
Basophils Absolute: 0 10*3/uL (ref 0.0–0.1)
EOS ABS: 0.4 10*3/uL (ref 0.0–0.7)
EOS PCT: 3 %
HCT: 45.3 % (ref 39.0–52.0)
Hemoglobin: 15.1 g/dL (ref 13.0–17.0)
Lymphocytes Relative: 11 %
Lymphs Abs: 1.7 10*3/uL (ref 0.7–4.0)
MCH: 30.5 pg (ref 26.0–34.0)
MCHC: 33.3 g/dL (ref 30.0–36.0)
MCV: 91.5 fL (ref 78.0–100.0)
Monocytes Absolute: 1 10*3/uL (ref 0.1–1.0)
Monocytes Relative: 7 %
Neutro Abs: 11.9 10*3/uL — ABNORMAL HIGH (ref 1.7–7.7)
Neutrophils Relative %: 79 %
PLATELETS: 288 10*3/uL (ref 150–400)
RBC: 4.95 MIL/uL (ref 4.22–5.81)
RDW: 14.6 % (ref 11.5–15.5)
WBC: 15 10*3/uL — AB (ref 4.0–10.5)

## 2016-09-18 LAB — COMPREHENSIVE METABOLIC PANEL
ALK PHOS: 52 U/L (ref 38–126)
ALT: 22 U/L (ref 17–63)
AST: 22 U/L (ref 15–41)
Albumin: 3.7 g/dL (ref 3.5–5.0)
Anion gap: 9 (ref 5–15)
BILIRUBIN TOTAL: 1.2 mg/dL (ref 0.3–1.2)
BUN: 29 mg/dL — ABNORMAL HIGH (ref 6–20)
CALCIUM: 8.3 mg/dL — AB (ref 8.9–10.3)
CO2: 23 mmol/L (ref 22–32)
CREATININE: 1.93 mg/dL — AB (ref 0.61–1.24)
Chloride: 106 mmol/L (ref 101–111)
GFR, EST AFRICAN AMERICAN: 38 mL/min — AB (ref >60)
GFR, EST NON AFRICAN AMERICAN: 33 mL/min — AB (ref >60)
Glucose, Bld: 217 mg/dL — ABNORMAL HIGH (ref 65–99)
Potassium: 5.1 mmol/L (ref 3.5–5.1)
Sodium: 138 mmol/L (ref 135–145)
TOTAL PROTEIN: 7 g/dL (ref 6.5–8.1)

## 2016-09-18 LAB — CBG MONITORING, ED: Glucose-Capillary: 153 mg/dL — ABNORMAL HIGH (ref 65–99)

## 2016-09-18 LAB — GLUCOSE, CAPILLARY
Glucose-Capillary: 222 mg/dL — ABNORMAL HIGH (ref 65–99)
Glucose-Capillary: 206 mg/dL — ABNORMAL HIGH (ref 65–99)

## 2016-09-18 LAB — LIPASE, BLOOD

## 2016-09-18 MED ORDER — ONDANSETRON HCL 4 MG/2ML IJ SOLN
4.0000 mg | Freq: Four times a day (QID) | INTRAMUSCULAR | Status: DC | PRN
  Administered 2016-09-22 (×2): 4 mg via INTRAVENOUS
  Filled 2016-09-18 (×2): qty 2

## 2016-09-18 MED ORDER — SODIUM CHLORIDE 0.9 % IV BOLUS (SEPSIS)
1000.0000 mL | Freq: Once | INTRAVENOUS | Status: AC
  Administered 2016-09-18: 1000 mL via INTRAVENOUS

## 2016-09-18 MED ORDER — INSULIN ASPART 100 UNIT/ML ~~LOC~~ SOLN
0.0000 [IU] | Freq: Three times a day (TID) | SUBCUTANEOUS | Status: DC
  Administered 2016-09-18: 3 [IU] via SUBCUTANEOUS
  Administered 2016-09-19 – 2016-09-20 (×5): 2 [IU] via SUBCUTANEOUS
  Administered 2016-09-20: 3 [IU] via SUBCUTANEOUS
  Administered 2016-09-21 (×2): 2 [IU] via SUBCUTANEOUS
  Administered 2016-09-21: 1 [IU] via SUBCUTANEOUS
  Administered 2016-09-22 (×2): 3 [IU] via SUBCUTANEOUS
  Administered 2016-09-22 – 2016-09-23 (×2): 2 [IU] via SUBCUTANEOUS
  Administered 2016-09-23 (×2): 3 [IU] via SUBCUTANEOUS
  Administered 2016-09-24: 2 [IU] via SUBCUTANEOUS
  Administered 2016-09-24: 3 [IU] via SUBCUTANEOUS
  Administered 2016-09-24: 5 [IU] via SUBCUTANEOUS
  Administered 2016-09-25: 3 [IU] via SUBCUTANEOUS
  Administered 2016-09-25 – 2016-09-26 (×3): 2 [IU] via SUBCUTANEOUS

## 2016-09-18 MED ORDER — SODIUM CHLORIDE 0.9 % IV SOLN
INTRAVENOUS | Status: DC
  Administered 2016-09-18 – 2016-09-20 (×5): via INTRAVENOUS

## 2016-09-18 MED ORDER — OXYCODONE HCL 5 MG PO TABS: 5.0000 mg | ORAL_TABLET | ORAL | Status: DC | PRN

## 2016-09-18 MED ORDER — DIFLUPREDNATE 0.05 % OP EMUL
1.0000 [drp] | Freq: Three times a day (TID) | OPHTHALMIC | Status: DC
  Administered 2016-09-18 – 2016-09-26 (×23): 1 [drp] via OPHTHALMIC

## 2016-09-18 MED ORDER — ALBUTEROL SULFATE (2.5 MG/3ML) 0.083% IN NEBU: 2.5000 mg | INHALATION_SOLUTION | RESPIRATORY_TRACT | Status: DC | PRN

## 2016-09-18 MED ORDER — METOCLOPRAMIDE HCL 5 MG/ML IJ SOLN
10.0000 mg | Freq: Once | INTRAMUSCULAR | Status: AC
  Administered 2016-09-18: 10 mg via INTRAVENOUS
  Filled 2016-09-18: qty 2

## 2016-09-18 MED ORDER — HEPARIN SODIUM (PORCINE) 5000 UNIT/ML IJ SOLN
5000.0000 [IU] | Freq: Three times a day (TID) | INTRAMUSCULAR | Status: DC
  Administered 2016-09-18 – 2016-09-26 (×24): 5000 [IU] via SUBCUTANEOUS
  Filled 2016-09-18 (×24): qty 1

## 2016-09-18 MED ORDER — ACETAMINOPHEN 325 MG PO TABS
650.0000 mg | ORAL_TABLET | Freq: Four times a day (QID) | ORAL | Status: DC | PRN
  Administered 2016-09-19 – 2016-09-22 (×9): 650 mg via ORAL
  Filled 2016-09-18 (×9): qty 2

## 2016-09-18 MED ORDER — ONDANSETRON HCL 4 MG PO TABS: 4.0000 mg | ORAL_TABLET | Freq: Four times a day (QID) | ORAL | Status: DC | PRN

## 2016-09-18 MED ORDER — ACETAMINOPHEN 650 MG RE SUPP: 650.0000 mg | Freq: Four times a day (QID) | RECTAL | Status: DC | PRN

## 2016-09-18 MED ORDER — ONDANSETRON HCL 4 MG/2ML IJ SOLN
4.0000 mg | Freq: Once | INTRAMUSCULAR | Status: AC
  Administered 2016-09-18: 4 mg via INTRAVENOUS
  Filled 2016-09-18: qty 2

## 2016-09-18 MED ORDER — GATIFLOXACIN 0.5 % OP SOLN
1.0000 [drp] | Freq: Three times a day (TID) | OPHTHALMIC | Status: DC
  Administered 2016-09-18 – 2016-09-19 (×3): 1 [drp] via OPHTHALMIC
  Filled 2016-09-18: qty 2.5

## 2016-09-18 NOTE — ED Notes (Signed)
Pt made aware of need for urine specimen 

## 2016-09-18 NOTE — ED Provider Notes (Signed)
Medical screening examination/treatment/procedure(s) were conducted as a shared visit with non-physician practitioner(s) and myself.  I personally evaluated the patient during the encounter.   EKG Interpretation  Date/Time:  Wednesday Sep 18 2016 10:11:12 EDT Ventricular Rate:  98 PR Interval:    QRS Duration: 95 QT Interval:  357 QTC Calculation: 456 R Axis:   68 Text Interpretation:  Sinus rhythm Probable left atrial enlargement Minimal ST depression, inferior leads Baseline wander in lead(s) V1 Confirmed by Minor Iden MD, Darrian Grzelak ([id number]) on 09/18/2016 10:40:34 AM      71 year old male who presents with nausea, vomiting, diarrhea for the past 2 days. States that he did have [employer] the day before, but no other people sick around him. Has had cold sweats and chills but no fever. Some intermittent abdominal discomfort associated with vomiting and diarrhea. Has not been able to urinate much since the incident and has not been able to tolerate by mouth intake.  Patient with active vomiting on arrival. Abdomen is soft and benign. Tachycardic likely related to dehydration and afebrile and otherwise hemodynamically stable. Blood work notable for history of present illness, likely prerenal due to dehydration. He does incidentally have mild hypoxia at 88% requiring some supplemental oxygen while here in the ED. Suspect aspiration. Chest x-ray visualized and does not show pneumonia, edema, or other acute cardiopulmonary processes. Feeling mildly improved after IV fluids and antibiotics, but still having ongoing nausea with minimal ability to tolerate by mouth. I plan to admit for continued IV fluids, dehydration, intractable vomiting.   Forde Dandy, MD 09/18/16 [phone]

## 2016-09-18 NOTE — ED Notes (Signed)
Bed: WA20 Expected date: 09/18/16 Expected time: 8:26 AM Means of arrival: Ambulance Comments: N/V/D

## 2016-09-18 NOTE — ED Provider Notes (Signed)
[facility] DEPT Provider Note   CSN: [id number] Arrival date & time: 09/18/16  4098     History   Chief Complaint Chief Complaint  Patient presents with  . N/V/D    HPI Martin [name] is a 71 y.o. male.  HPI  Patient, with past medical history of insulin-dependent diabetes and hypertension, presents with 4 day history of nausea and 1 day history of several episodes of vomiting and diarrhea. Denies blood in stool or vomit. He states that his last meal was 2 days ago at [employer]. He states that since yesterday he has been expressing multiple episodes of vomiting and loose stools which has caused him to feel lightheaded. Also complains of mild abdominal discomfort due to all the vomiting, and an episode of cold sweats earlier this morning. Patient takes 300 units of insulin every morning but has not taken it for the past 3 days due to his blood sugars being in the 100s which she states is low for him. Patient reports compliance with other medication. Denies loss of consciousness, recent illness, chest pain, trouble breathing, urinary symptoms, fever.  Of note, patient had cataract surgery 2 days ago but states he is doing well after this.  Past abdominal surgical history consists of hernia repair decades ago.  Past Medical History:  Diagnosis Date  . Diabetes mellitus   . Hypertension     Patient Active Problem List   Diagnosis Date Noted  . History of skin cancer 08/15/2015  . Undiagnosed cardiac murmurs 08/15/2015  . Diabetic retinopathy ([place]) 02/21/2015  . Former smoker 08/24/2014  . Hyperlipidemia 08/24/2014  . Ingrowing toenail 07/19/2014  . Morbid obesity ([place]) 04/12/2014  . Diabetic polyneuropathy ([place]) 04/11/2009  . BACK PAIN, LUMBAR, WITH RADICULOPATHY 08/30/2008  . DM (diabetes mellitus) type II uncontrolled with eye manifestation ([place]) 02/03/2007  . Essential hypertension 02/03/2007    Past Surgical History:  Procedure Laterality Date  . HERNIA REPAIR       >10 years ago  . right shoulder surgery     around 2014       Home Medications    Prior to Admission medications   Medication Sig Start Date End Date Taking? Authorizing Provider  ACCU-CHEK COMPACT TEST DRUM test strip TEST AS DIRECTED 08/19/11   Ricard Dillon, MD  amitriptyline (ELAVIL) 50 MG tablet TAKE 1 TABLET BY MOUTH AT BEDTIME 08/05/16   Marletta Lor, MD  BESIVANCE 0.6 % SUSP Place 1 drop into the left eye 3 (three) times daily. 08/27/16   [provider]  Folic Acid-Vit J1-BJY N82 (FOLBEE) 2.5-25-1 MG TABS Take 1 tablet by mouth 2 (two) times daily.      [provider]  glucose blood test strip 100 each by Other route 3 (three) times daily. Use as instructed 12/14/12   Ricard Dillon, MD  Insulin Glargine (LANTUS SOLOSTAR) 100 UNIT/ML Solostar Pen Inject 320 Units into the skin every morning. 06/18/16   Renato Shin, MD  NOVOFINE 32G X 6 MM MISC USE 2 TIMES DAILY 09/02/16   Marin Olp, MD  Pitavastatin Calcium (LIVALO) 1 MG TABS Take 1 tablet (1 mg total) by mouth daily. 02/14/16   Marin Olp, MD  silver sulfADIAZINE (SILVADENE) 1 % cream Apply 1 application topically daily. 04/24/15   Gardiner Barefoot, DPM  valsartan-hydrochlorothiazide (DIOVAN-HCT) 320-25 MG tablet TAKE 1 TABLET BY MOUTH DAILY. 07/02/16   Marin Olp, MD    Family History Family History  Problem Relation Age of  Onset  . Hypertension Mother   . Heart disease Mother        CHF did not see doctor  . Diabetes Maternal Grandmother   . Diabetes Son     Social History Social History  Substance Use Topics  . Smoking status: Former Smoker    Quit date: 04/29/2000  . Smokeless tobacco: Never Used     Comment: smoked for 10 years on and off  . Alcohol use 0.0 oz/week     Comment: 6 martinis a year     Allergies   Simvastatin   Review of Systems Review of Systems  Constitutional: Positive for appetite change and chills. Negative for fever.  HENT: Negative for  ear pain, rhinorrhea, sneezing and sore throat.   Eyes: Negative for photophobia and visual disturbance.  Respiratory: Negative for cough, chest tightness, shortness of breath and wheezing.   Cardiovascular: Negative for chest pain and palpitations.  Gastrointestinal: Positive for abdominal pain, diarrhea, nausea and vomiting. Negative for blood in stool and constipation.  Endocrine: Negative for polyuria.  Genitourinary: Negative for dysuria, hematuria and urgency.  Musculoskeletal: Negative for myalgias.  Skin: Negative for rash.  Neurological: Positive for light-headedness. Negative for dizziness, syncope, weakness and headaches.     Physical Exam Updated Vital Signs BP 130/73   Pulse 99   Temp 97.5 F (36.4 C) (Oral)   Resp 16   SpO2 96%   Physical Exam  Constitutional: He appears well-developed and well-nourished. No distress.  HENT:  Head: Normocephalic and atraumatic.  Nose: Nose normal.  Eyes: Conjunctivae and EOM are normal. Right eye exhibits no discharge. Left eye exhibits no discharge. No scleral icterus.  Neck: Normal range of motion. Neck supple.  Cardiovascular: Normal rate, regular rhythm, normal heart sounds and intact distal pulses.  Exam reveals no gallop and no friction rub.   No murmur heard. Pulmonary/Chest: Effort normal and breath sounds normal. No respiratory distress.  Abdominal: Soft. Bowel sounds are normal. He exhibits no distension. There is no tenderness (Generalized abdominal discomfort with palpation ). There is no rebound and no guarding.  Musculoskeletal: Normal range of motion. He exhibits no edema.  Neurological: He is alert. He exhibits normal muscle tone. Coordination normal.  Skin: Skin is warm and dry. No rash noted.  Psychiatric: He has a normal mood and affect.  Nursing note and vitals reviewed.    ED Treatments / Results  Labs (all labs ordered are listed, but only abnormal results are displayed) Labs Reviewed  CBG MONITORING,  ED - Abnormal; Notable for the following:       Result Value   Glucose-Capillary 153 (*)    All other components within normal limits    EKG  EKG Interpretation None       Radiology No results found.  Procedures Procedures (including critical care time)  Medications Ordered in ED Medications - No data to display   Initial Impression / Assessment and Plan / ED Course  I have reviewed the triage vital signs and the nursing notes.  Pertinent labs & imaging results that were available during my care of the patient were reviewed by me and considered in my medical decision making (see chart for details).     Patient's history and symptoms concerning for gastroenteritis versus dehydration. Patient has had several episodes of diarrhea and vomiting that have both been nonbloody for the past day. He on arrival was 153. Patient states that he is not taking his insulin due to his low blood  sugars.  1049: Nurse states that patient has an episode of desats at 87%. Placed on nasal cannula. It is possible that patient had aspiration from his multiple episodes of vomiting. Chest x-ray unremarkable at this time but could still be possible that he aspirated. Oxygen saturations improved to 96.  1155: Patient does not appear to have any acute intra-abdominal abnormality based on physical exam with palpation. No need for CT at this time. He is afebrile with no history of fever although did experience sweats. He does have leukocytosis at 15. Lipase unremarkable. CMP revealed evidence of AKI with BUN/creatinine at 29 and 1.93. No recent values for comparison. All other electrolytes normal. Urinalysis pending. Patient continued to have nausea and vomiting despite Zofran 2 and Phenergan.  Patient will likely need to be admitted for evaluation and treatment of his intractable nausea and vomiting here. Will speak to hospitalist team for admission.  Patient discussed with and seen by Dr. Oleta Mouse.  Final  Clinical Impressions(s) / ED Diagnoses   Final diagnoses:  None    New Prescriptions New Prescriptions   No medications on file     Delia Heady, PA-C 09/18/16 1302    Forde Dandy, MD 09/18/16 [phone]

## 2016-09-18 NOTE — ED Triage Notes (Signed)
Per EMS, pt is from with complaints of n/v/d for 3 days. EMS states pt was actively vomiting on arrival. Pt is AO x4. Pt has a hx of hypertension and diabetes.

## 2016-09-18 NOTE — ED Notes (Signed)
Blood draw attempted x1 unsuccessful 

## 2016-09-18 NOTE — H&P (Signed)
HISTORY AND PHYSICAL       PATIENT DETAILS Name: Martin [name] Age: 71 y.o. Sex: male Date of Birth: Sep 01, 1945 Admit Date: 09/18/2016 JQB:HALPFX, Martin Mars, MD   Patient coming from: Home   CHIEF COMPLAINT:  Vomiting and diarrhea since yesterday  HPI: Martin [name] is a 71 y.o. male with medical history significant of insulin-dependent type 2 diabetes, hypertension, obesity, who underwent left cataract surgery a few days back, presented to the hospital for evaluation of persistent nausea, vomiting and diarrhea since yesterday. Per patient, over this past weekend he had some occasional nausea that spontaneously improved. Since yesterday he started having vomiting and diarrhea. Vomitus is nonbloody and nonbilious. Stools are loose without any blood or mucus in it. He denies any abdominal pain or fever (but does acknowledge sweats). He denies any sick contacts. Per spouse, he has gotten progressively weak, and has been having difficulty ambulation. Because of his persistent nature of his symptoms, he was brought to the emergency room, he was found to have acute kidney injury, the [facility] hospitalist service was asked with that this patient for further evaluation and treatment.   No history of headache, fever, neck pain, chest pain, shortness of breath, abdominal pain, dysuria or hematuria  ED Course:  Patient was given 1 L of IV fluids, antiemetics, and referred to the hospitalist service for admission  Note: Lives at: Home Mobility:  [place] Chronic Indwelling Foley:no   REVIEW OF SYSTEMS:  Constitutional:   No  weight loss, night sweats,  chills  HEENT:    No headaches, Dysphagia,Tooth/dental problems,Sore throat,  No sneezing, itching, ear ache, nasal congestion, post nasal drip  Cardio-vascular: No chest pain,Orthopnea, PND,lower extremity edema, anasarca, palpitations  GI:  No heartburn, indigestion, abdominal pain,  melena or hematochezia  Resp: No  shortness of breath, cough, hemoptysis,plueritic chest pain.   Skin:  No rash or lesions.  GU:  No dysuria, change in color of urine, no urgency or frequency.  No flank pain.  Musculoskeletal: No joint pain or swelling.  No decreased range of motion.  No back pain.  Endocrine: No heat intolerance, no cold intolerance, no polyuria, no polydipsia  Psych: No change in mood or affect. No depression or anxiety.  No memory loss.   ALLERGIES:   Allergies  Allergen Reactions  . Simvastatin Diarrhea    PAST MEDICAL HISTORY: Past Medical History:  Diagnosis Date  . Diabetes mellitus   . Hypertension     PAST SURGICAL HISTORY: Past Surgical History:  Procedure Laterality Date  . HERNIA REPAIR     >10 years ago  . right shoulder surgery     around 2014    MEDICATIONS AT HOME: Prior to Admission medications   Medication Sig Start Date End Date Taking? Authorizing Provider  BESIVANCE 0.6 % SUSP Place 1 drop into the left eye 3 (three) times daily. 08/27/16  Yes [provider]  Difluprednate (DUREZOL) 0.05 % EMUL Place 1 drop into the left eye 3 (three) times daily.   Yes [provider]  Insulin Glargine (LANTUS SOLOSTAR) 100 UNIT/ML Solostar Pen Inject 320 Units into the skin every morning. 06/18/16  Yes Renato Shin, MD  valsartan-hydrochlorothiazide (DIOVAN-HCT) 320-25 MG tablet TAKE 1 TABLET BY MOUTH DAILY. 07/02/16  Yes Marin Olp, MD  ACCU-CHEK COMPACT TEST DRUM test strip TEST AS DIRECTED 08/19/11   Ricard Dillon, MD  amitriptyline (ELAVIL) 50 MG tablet TAKE 1 TABLET BY MOUTH AT  BEDTIME Patient not taking: Reported on 09/18/2016 08/05/16   Marletta Lor, MD  glucose blood test strip 100 each by Other route 3 (three) times daily. Use as instructed 12/14/12   Ricard Dillon, MD  NOVOFINE 32G X 6 MM MISC USE 2 TIMES DAILY 09/02/16   Marin Olp, MD  Pitavastatin Calcium (LIVALO) 1 MG TABS Take 1 tablet (1 mg total) by mouth daily. Patient not  taking: Reported on 09/18/2016 02/14/16   Marin Olp, MD  silver sulfADIAZINE (SILVADENE) 1 % cream Apply 1 application topically daily. Patient not taking: Reported on 09/18/2016 04/24/15   Gardiner Barefoot, DPM    FAMILY HISTORY: Family History  Problem Relation Age of Onset  . Hypertension Mother   . Heart disease Mother        CHF did not see doctor  . Diabetes Maternal Grandmother   . Diabetes Son    SOCIAL HISTORY:  reports that he quit smoking about 16 years ago. He has never used smokeless tobacco. He reports that he drinks alcohol. He reports that he does not use drugs.  PHYSICAL EXAM: Blood pressure (!) 103/44, pulse 98, temperature 97.5 F (36.4 C), temperature source Oral, resp. rate (!) 23, SpO2 96 %.  General appearance :Awake, alert, not in any distress. Speech Clear. Eyes:, pupils equally reactive to light and accomodation,no scleral icterus HEENT: Atraumatic and Normocephalic Neck: supple, no JVD. No cervical lymphadenopathy.  Resp:Good air entry bilaterally, no added sounds  CVS: S1 S2 regular, no murmurs.  GI: Bowel sounds present, Non tender and not distended with no gaurding, rigidity or rebound.No organomegaly Extremities: B/L Lower Ext shows no edema, both legs are warm to touch Neurology:  speech clear,Non focal, sensation is grossly intact. Psychiatric: Normal judgment and insight. Alert and oriented x 3. Normal mood. Musculoskeletal:No digital cyanosis Skin:No Rash, warm and dry Wounds:N/A  LABS ON ADMISSION:  I have personally reviewed following labs and imaging studies  CBC:  Recent Labs Lab 09/18/16 1040  WBC 15.0*  NEUTROABS 11.9*  HGB 15.1  HCT 45.3  MCV 91.5  PLT 546    Basic Metabolic Panel:  Recent Labs Lab 09/18/16 1040  NA 138  K 5.1  CL 106  CO2 23  GLUCOSE 217*  BUN 29*  CREATININE 1.93*  CALCIUM 8.3*    GFR: CrCl cannot be calculated (Unknown ideal weight.).  Liver Function Tests:  Recent Labs Lab  09/18/16 1040  AST 22  ALT 22  ALKPHOS 52  BILITOT 1.2  PROT 7.0  ALBUMIN 3.7    Recent Labs Lab 09/18/16 1040  LIPASE <10*   No results for input(s): AMMONIA in the last 168 hours.  Coagulation Profile: No results for input(s): INR, PROTIME in the last 168 hours.  Cardiac Enzymes: No results for input(s): CKTOTAL, CKMB, CKMBINDEX, TROPONINI in the last 168 hours.  BNP (last 3 results) No results for input(s): PROBNP in the last 8760 hours.  HbA1C: No results for input(s): HGBA1C in the last 72 hours.  CBG:  Recent Labs Lab 09/18/16 0848  GLUCAP 153*    Lipid Profile: No results for input(s): CHOL, HDL, LDLCALC, TRIG, CHOLHDL, LDLDIRECT in the last 72 hours.  Thyroid Function Tests: No results for input(s): TSH, T4TOTAL, FREET4, T3FREE, THYROIDAB in the last 72 hours.  Anemia Panel: No results for input(s): VITAMINB12, FOLATE, FERRITIN, TIBC, IRON, RETICCTPCT in the last 72 hours.  Urine analysis:    Component Value Date/Time   COLORURINE YELLOW 07/11/2008 0734  APPEARANCEUR Clear 07/11/2008 0734   LABSPEC 1.025 07/11/2008 0734   PHURINE 5.0 07/11/2008 0734   GLUCOSEU > or = 1000 mg/dL (AA) 07/11/2008 0734   BILIRUBINUR n 08/08/2015 1010   KETONESUR 15 mg/dL (A) 07/11/2008 0734   PROTEINUR 2+ 08/08/2015 1010   UROBILINOGEN 0.2 08/08/2015 1010   UROBILINOGEN 0.2 mg/dL 07/11/2008 0734   NITRITE n 08/08/2015 1010   NITRITE Negative 07/11/2008 0734   LEUKOCYTESUR Negative 08/08/2015 1010    Sepsis Labs: Lactic Acid, Venous No results found for: [place]   Microbiology: No results found for this or any previous visit (from the past 240 hour(s)).    RADIOLOGIC STUDIES ON ADMISSION: Dg Chest 2 View  Result Date: 09/18/2016 CLINICAL DATA:  Nausea, vomiting and diarrhea for 3 days. Shortness of breath. EXAM: CHEST  2 VIEW COMPARISON:  None. FINDINGS: Streaky bibasilar airspace disease is more notable on the right. Lung volumes are low. No  pneumothorax or pleural effusion. Heart size is upper normal. No acute bony abnormality. IMPRESSION: Streaky basilar airspace opacities are likely due to atelectasis in this low volume chest. Electronically Signed   By: Inge Rise M.D.   On: 09/18/2016 11:22    I have personally reviewed images of chest xray-No obvious pneumonia  EKG:  Personally reviewed. NSR  ASSESSMENT AND PLAN: Gastroenteritis: Suspect viral syndrome, he appears dehydrated and has acute kidney injury-but he does not look acutely sick. Will check stool GI pathogen panel-hold off on starting antibiotics at this time. Plans are to hydrate and provided supportive measures. Will place on clear liquid diet, and slowly advance  Acute kidney injury: Likely hemodynamically mediated due to above-plans are to hydrate-hold losartan/HCTZ-avoid nephrotoxic agents and repeat electrolytes tomorrow morning. Hopefully to improve with just supportive care.  Hypertension: Blood pressure soft-avoid antihypertensives for now.  Dehydration: Secondary to GI loss-hydrate and reassess volume status tomorrow  Generalized weakness: Suspect secondary to dehydration and acute illness, PT evaluation  Insulin-dependent type 2 diabetes: Per patient-he has not been taking any insulin since this past Sunday-he claims that his blood sugars were low after he had cataract surgery. He normally takes in around 300 units of Lantus daily and covers himself with short-acting insulin with meals. For now I will just cover him with SSI, follow CBG trend and adjust insulin regimen accordingly.  History of peripheral neuropathy: Does not appear to be on any medications-follow for now  Dyslipidemia: Hold statin-once oral intake is more stable-we'll resume  Morbid obesity: Will need counseling regarding weight loss when closer to discharge.  Further plan will depend as patient's clinical course evolves and further radiologic and laboratory data become available.  Patient will be monitored closely.  Above noted plan was discussed with patient/spouse face to face at bedside, they were in agreement.   CONSULTS: None  DVT Prophylaxis: Prophylactic Heparin  Code Status: Full Code   Disposition Plan:  Discharge back home vs  possibly in 2 days, depending on clinical course  Admission status: Inpatient going to medical floor   The medical decision making on this patient was of high complexity and the patient is at high risk for clinical deterioration, therefore this is a level 3 visit.  Total time spent  55 minutes.Greater than 50% of this time was spent in counseling, explanation of diagnosis, planning of further management, and coordination of care.  Oren Binet [facility] Pager [phone]  If 7PM-7AM, please contact night-coverage [email or web address] Password [email or web address] 09/18/2016, 12:40 PM

## 2016-09-19 LAB — CBC
HCT: 35.6 % — ABNORMAL LOW (ref 39.0–52.0)
Hemoglobin: 11.8 g/dL — ABNORMAL LOW (ref 13.0–17.0)
MCH: 29.8 pg (ref 26.0–34.0)
MCHC: 33.1 g/dL (ref 30.0–36.0)
MCV: 89.9 fL (ref 78.0–100.0)
PLATELETS: 228 10*3/uL (ref 150–400)
RBC: 3.96 MIL/uL — AB (ref 4.22–5.81)
RDW: 14.6 % (ref 11.5–15.5)
WBC: 15.2 10*3/uL — AB (ref 4.0–10.5)

## 2016-09-19 LAB — GASTROINTESTINAL PANEL BY PCR, STOOL (REPLACES STOOL CULTURE)
ASTROVIRUS: NOT DETECTED
Adenovirus F40/41: NOT DETECTED
CYCLOSPORA CAYETANENSIS: NOT DETECTED
Campylobacter species: NOT DETECTED
Cryptosporidium: NOT DETECTED
ENTAMOEBA HISTOLYTICA: NOT DETECTED
ENTEROAGGREGATIVE E COLI (EAEC): NOT DETECTED
Enteropathogenic E coli (EPEC): NOT DETECTED
Enterotoxigenic E coli (ETEC): NOT DETECTED
GIARDIA LAMBLIA: NOT DETECTED
Norovirus GI/GII: NOT DETECTED
Plesimonas shigelloides: NOT DETECTED
Rotavirus A: NOT DETECTED
SALMONELLA SPECIES: NOT DETECTED
Sapovirus (I, II, IV, and V): NOT DETECTED
Shiga like toxin producing E coli (STEC): NOT DETECTED
Shigella/Enteroinvasive E coli (EIEC): NOT DETECTED
VIBRIO CHOLERAE: NOT DETECTED
VIBRIO SPECIES: NOT DETECTED
Yersinia enterocolitica: NOT DETECTED

## 2016-09-19 LAB — URINALYSIS, ROUTINE W REFLEX MICROSCOPIC
BILIRUBIN URINE: NEGATIVE
Glucose, UA: NEGATIVE mg/dL
Ketones, ur: NEGATIVE mg/dL
LEUKOCYTES UA: NEGATIVE
Nitrite: NEGATIVE
PROTEIN: NEGATIVE mg/dL
Specific Gravity, Urine: 1.014 (ref 1.005–1.030)
pH: 5 (ref 5.0–8.0)

## 2016-09-19 LAB — GLUCOSE, CAPILLARY
GLUCOSE-CAPILLARY: 159 mg/dL — AB (ref 65–99)
GLUCOSE-CAPILLARY: 182 mg/dL — AB (ref 65–99)
Glucose-Capillary: 170 mg/dL — ABNORMAL HIGH (ref 65–99)
Glucose-Capillary: 154 mg/dL — ABNORMAL HIGH (ref 65–99)

## 2016-09-19 LAB — TROPONIN I
Troponin I: <0.03 ng/mL (ref <0.03)
Troponin I: <0.03 ng/mL (ref <0.03)

## 2016-09-19 LAB — BASIC METABOLIC PANEL
ANION GAP: 7 (ref 5–15)
BUN: 40 mg/dL — ABNORMAL HIGH (ref 6–20)
CHLORIDE: 102 mmol/L (ref 101–111)
CO2: 24 mmol/L (ref 22–32)
Calcium: 7.7 mg/dL — ABNORMAL LOW (ref 8.9–10.3)
Creatinine, Ser: 2.3 mg/dL — ABNORMAL HIGH (ref 0.61–1.24)
GFR calc non Af Amer: 27 mL/min — ABNORMAL LOW (ref >60)
GFR, EST AFRICAN AMERICAN: 31 mL/min — AB (ref >60)
Glucose, Bld: 177 mg/dL — ABNORMAL HIGH (ref 65–99)
Potassium: 4.5 mmol/L (ref 3.5–5.1)
SODIUM: 133 mmol/L — AB (ref 135–145)

## 2016-09-19 LAB — CLOSTRIDIUM DIFFICILE BY PCR: CDIFFPCR: POSITIVE — AB

## 2016-09-19 LAB — C DIFFICILE QUICK SCREEN W PCR REFLEX
C DIFFICILE (CDIFF) TOXIN: NEGATIVE
C Diff antigen: POSITIVE — AB

## 2016-09-19 MED ORDER — GI COCKTAIL ~~LOC~~
30.0000 mL | Freq: Three times a day (TID) | ORAL | Status: DC | PRN
  Administered 2016-09-19: 30 mL via ORAL
  Filled 2016-09-19: qty 30

## 2016-09-19 MED ORDER — BESIFLOXACIN HCL 0.6 % OP SUSP: 1.0000 [drp] | Freq: Three times a day (TID) | OPHTHALMIC | Status: DC

## 2016-09-19 MED ORDER — VANCOMYCIN 50 MG/ML ORAL SOLUTION
125.0000 mg | Freq: Four times a day (QID) | ORAL | Status: DC
  Administered 2016-09-19 – 2016-09-26 (×27): 125 mg via ORAL
  Filled 2016-09-19 (×29): qty 2.5

## 2016-09-19 MED ORDER — BESIFLOXACIN HCL 0.6 % OP SUSP
1.0000 [drp] | Freq: Three times a day (TID) | OPHTHALMIC | Status: DC
  Administered 2016-09-19 – 2016-09-26 (×20): 1 [drp] via OPHTHALMIC

## 2016-09-19 NOTE — Progress Notes (Signed)
Patient ID: Martin [name], male   DOB: 08-31-1945, 71 y.o.   MRN: [id number]    PROGRESS NOTE   Dreden Rivere [facility]  NWG:[id number] DOB: 1946/02/27 DOA: 09/18/2016  PCP: Marin Olp, MD   Brief Narrative:  Pt is 71 yo male with known insulin dependent DM type II, HTN, presented to [facility] ED with main concern of 1-2 days duration of progressively worsening abd pain, watery diarrhea, nausea and poor oral intake.  Assessment & Plan:   Principal Problem:   Sepsis secondary to C. Diff  - pt meets criteria for Sepsis with HR up to 116 bpm, RR > 22 bpm, WBC 15K - initially though to be due to gastroenteritis but now C. Diff results positive  - place pt on oral vancomycin - continue to provide supportive care with IVF, analgesia and antiemetics as needed - advance diet as pt able to tolerate  - consider GI consult in next 24 hours if pt not better  - repeat CBC in AM  Active Problems:   Acute kidney injury - in the setting of C. Diff colitis - pre renal  - IVF have been provided but Cr trending up - will ask for bladder scan - lower the rate of IVF due to concern of volume overload - repeat BMP in AM     Urinary retention - with Cr increasing - asked for bladder scan - monitor I/O    DM (diabetes mellitus) type II uncontrolled with eye manifestation, neuropathy  - keep on SSI until oral intake improves     Essential hypertension - reasonable inpatient control     Morbid obesity ([place]) - check weight and height to calculate BMI but suspect it is > 35    Chronic diastolic CHF - last EF stable in 57/8469, grade I diastolic CHF - strict I/O, daily weights - lower rate of IVF  DVT prophylaxis: Heparin SQ Code Status: Full  Family Communication: Patient and husband at bedside  Disposition Plan: to be determined, likely home in 2-3 days   Consultants:   None  Procedures:   None  Antimicrobials:   Oral vancomycin 5/24 -->   Subjective: Pt reports ongoing watery  diarrhea   Objective: Vitals:   09/19/16 0439 09/19/16 0857 09/19/16 1325 09/19/16 1400  BP: 121/63 134/67 (!) 160/72 (!) 133/51  Pulse: (!) 105 94 96 90  Resp: 20     Temp: 98.4 F (36.9 C) 98.3 F (36.8 C)  98.8 F (37.1 C)  TempSrc: Oral Oral  Oral  SpO2: 94% 92% 97% 94%    Intake/Output Summary (Last 24 hours) at 09/19/16 1657 Last data filed at 09/19/16 1600  Gross per 24 hour  Intake             2130 ml  Output              675 ml  Net             1455 ml   There were no vitals filed for this visit.  Examination:  General exam: Appears calm and comfortable  Respiratory system: Clear to auscultation. Respiratory effort normal. Cardiovascular system: S1 & S2 heard, RRR. No JVD, murmurs, rubs, gallops or clicks. No pedal edema. Gastrointestinal system: Abdomen is nondistended, tender in lower abd quadrants  Central nervous system: Alert and oriented. No focal neurological deficits. Extremities: Symmetric 5 x 5 power. Skin: No rashes, lesions or ulcers Psychiatry: Judgement and insight appear normal. Mood & affect appropriate.  Data Reviewed: I have personally reviewed following labs and imaging studies  CBC:  Recent Labs Lab 09/18/16 1040 09/19/16 0545  WBC 15.0* 15.2*  NEUTROABS 11.9*  --   HGB 15.1 11.8*  HCT 45.3 35.6*  MCV 91.5 89.9  PLT 288 115   Basic Metabolic Panel:  Recent Labs Lab 09/18/16 1040 09/19/16 0545  NA 138 133*  K 5.1 4.5  CL 106 102  CO2 23 24  GLUCOSE 217* 177*  BUN 29* 40*  CREATININE 1.93* 2.30*  CALCIUM 8.3* 7.7*   Liver Function Tests:  Recent Labs Lab 09/18/16 1040  AST 22  ALT 22  ALKPHOS 52  BILITOT 1.2  PROT 7.0  ALBUMIN 3.7    Recent Labs Lab 09/18/16 1040  LIPASE <10*   Cardiac Enzymes:  Recent Labs Lab 09/19/16 1410  TROPONINI <0.03   CBG:  Recent Labs Lab 09/18/16 1702 09/18/16 2050 09/19/16 0736 09/19/16 1123 09/19/16 1653  GLUCAP 222* 206* 159* 182* 170*   Urine analysis:     Component Value Date/Time   COLORURINE YELLOW 09/19/2016 [place] 09/19/2016 1129   LABSPEC 1.014 09/19/2016 1129   PHURINE 5.0 09/19/2016 1129   GLUCOSEU NEGATIVE 09/19/2016 1129   GLUCOSEU > or = 1000 mg/dL (AA) 07/11/2008 0734   HGBUR LARGE (A) 09/19/2016 1129   BILIRUBINUR NEGATIVE 09/19/2016 1129   BILIRUBINUR n 08/08/2015 1010   KETONESUR NEGATIVE 09/19/2016 1129   PROTEINUR NEGATIVE 09/19/2016 1129   UROBILINOGEN 0.2 08/08/2015 1010   UROBILINOGEN 0.2 mg/dL 07/11/2008 0734   NITRITE NEGATIVE 09/19/2016 1129   LEUKOCYTESUR NEGATIVE 09/19/2016 1129   Recent Results (from the past 240 hour(s))  Gastrointestinal Panel by PCR , Stool     Status: None   Collection Time: 09/19/16  4:47 AM  Result Value Ref Range Status   Campylobacter species NOT DETECTED NOT DETECTED Final   Plesimonas shigelloides NOT DETECTED NOT DETECTED Final   Salmonella species NOT DETECTED NOT DETECTED Final   Yersinia enterocolitica NOT DETECTED NOT DETECTED Final   Vibrio species NOT DETECTED NOT DETECTED Final   Vibrio cholerae NOT DETECTED NOT DETECTED Final   Enteroaggregative E coli (EAEC) NOT DETECTED NOT DETECTED Final   Enteropathogenic E coli (EPEC) NOT DETECTED NOT DETECTED Final   Enterotoxigenic E coli (ETEC) NOT DETECTED NOT DETECTED Final   Shiga like toxin producing E coli (STEC) NOT DETECTED NOT DETECTED Final   Shigella/Enteroinvasive E coli (EIEC) NOT DETECTED NOT DETECTED Final   Cryptosporidium NOT DETECTED NOT DETECTED Final   Cyclospora cayetanensis NOT DETECTED NOT DETECTED Final   Entamoeba histolytica NOT DETECTED NOT DETECTED Final   Giardia lamblia NOT DETECTED NOT DETECTED Final   Adenovirus F40/41 NOT DETECTED NOT DETECTED Final   Astrovirus NOT DETECTED NOT DETECTED Final   Norovirus GI/GII NOT DETECTED NOT DETECTED Final   Rotavirus A NOT DETECTED NOT DETECTED Final   Sapovirus (I, II, IV, and V) NOT DETECTED NOT DETECTED Final  C difficile quick scan  w PCR reflex     Status: Abnormal   Collection Time: 09/19/16  4:47 AM  Result Value Ref Range Status   C Diff antigen POSITIVE (A) NEGATIVE Final   C Diff toxin NEGATIVE NEGATIVE Final   C Diff interpretation Results are indeterminate. See PCR results.  Final  Clostridium Difficile by PCR     Status: Abnormal   Collection Time: 09/19/16  4:47 AM  Result Value Ref Range Status   Toxigenic C Difficile by pcr POSITIVE (  A) NEGATIVE Final    Comment: Positive for toxigenic C. difficile with little to no toxin production. Only treat if clinical presentation suggests symptomatic illness. Performed at [facility], [place] [address] [place], [place] [address]     Radiology Studies: Dg Chest 2 View  Result Date: 09/18/2016 CLINICAL DATA:  Nausea, vomiting and diarrhea for 3 days. Shortness of breath. EXAM: CHEST  2 VIEW COMPARISON:  None. FINDINGS: Streaky bibasilar airspace disease is more notable on the right. Lung volumes are low. No pneumothorax or pleural effusion. Heart size is upper normal. No acute bony abnormality. IMPRESSION: Streaky basilar airspace opacities are likely due to atelectasis in this low volume chest. Electronically Signed   By: Inge Rise M.D.   On: 09/18/2016 11:22   Scheduled Meds: . Besifloxacin HCl  1 drop Left Eye TID  . Difluprednate  1 drop Left Eye TID  . heparin  5,000 Units Subcutaneous Q8H  . insulin aspart  0-9 Units Subcutaneous TID WC  . vancomycin  125 mg Oral QID   Continuous Infusions: . sodium chloride 75 mL/hr at 09/19/16 0944    LOS: 1 day   Time spent: 20 minutes   Faye Ramsay, MD [facility] Pager [phone]  If 7PM-7AM, please contact night-coverage [email or web address] Password [facility] 09/19/2016, 4:57 PM

## 2016-09-19 NOTE — Progress Notes (Signed)
Pt c/o chest pressure rating 6 out of 10.  Denies radiating, n/v, diaphoresis, or etc.  BP 160/72, HR 96.  MD paged.

## 2016-09-19 NOTE — Evaluation (Signed)
Physical Therapy One Time Evaluation Patient Details Name: Martin [name] MRN: [id number] DOB: 1945/11/17 Today's Date: 09/19/2016   History of Present Illness  71 y.o. male with medical history significant of insulin-dependent type 2 diabetes, hypertension, obesity, recent left cataract surgery presented to the hospital for evaluation of persistent nausea, vomiting and diarrhea and admitted for Gastroenteritis  Clinical Impression  Patient evaluated by Physical Therapy with no further acute PT needs identified. All education has been completed and the patient has no further questions.  Pt mobilizing well and encouraged him to ambulate with staff during acute stay (due to contact precautions).  Pt reports hx of peripheral neuropathy in feet so discussed checking feet every day and wearing supportive shoes.  Pt reports he goes to wound care center if he notices any nonhealing areas. See below for any follow-up Physical Therapy or equipment needs. PT is signing off. Thank you for this referral.     Follow Up Recommendations No PT follow up    Equipment Recommendations  None recommended by PT    Recommendations for Other Services       Precautions / Restrictions Precautions Precautions: None      Mobility  Bed Mobility Overal bed mobility: Modified Independent                Transfers Overall transfer level: Modified independent                  Ambulation/Gait Ambulation/Gait assistance: Supervision;Modified independent (Device/Increase time) Ambulation Distance (Feet): 300 Feet Assistive device: None Gait Pattern/deviations: Step-through pattern     General Gait Details: pt pushed IV pole however not required for support, pt tolerated well, no c/o weakness, no unsteadiness or LOB  Stairs            Wheelchair Mobility    Modified Rankin (Stroke Patients Only)       Balance Overall balance assessment: No apparent balance deficits (not formally  assessed)                                           Pertinent Vitals/Pain Pain Assessment: No/denies pain    Home Living Family/patient expects to be discharged to:: Private residence Living Arrangements: Spouse/significant other   Type of Home: House       Home Layout: One level Home Equipment: None      Prior Function Level of Independence: Independent               Hand Dominance        Extremity/Trunk Assessment        Lower Extremity Assessment Lower Extremity Assessment: Overall WFL for tasks assessed RLE Sensation: history of peripheral neuropathy LLE Sensation: history of peripheral neuropathy       Communication   Communication: No difficulties  Cognition Arousal/Alertness: Awake/alert Behavior During Therapy: WFL for tasks assessed/performed Overall Cognitive Status: Within Functional Limits for tasks assessed                                        General Comments      Exercises     Assessment/Plan    PT Assessment Patent does not need any further PT services  PT Problem List         PT Treatment Interventions  PT Goals (Current goals can be found in the Care Plan section)  Acute Rehab PT Goals PT Goal Formulation: All assessment and education complete, DC therapy    Frequency     Barriers to discharge        Co-evaluation               AM-PAC PT "6 Clicks" Daily Activity  Outcome Measure Difficulty turning over in bed (including adjusting bedclothes, sheets and blankets)?: None Difficulty moving from lying on back to sitting on the side of the bed? : None Difficulty sitting down on and standing up from a chair with arms (e.g., wheelchair, bedside commode, etc,.)?: None Help needed moving to and from a bed to chair (including a wheelchair)?: None Help needed walking in hospital room?: None Help needed climbing 3-5 steps with a railing? : None 6 Click Score: 24    End of  Session   Activity Tolerance: Patient tolerated treatment well Patient left: in bed;with call bell/phone within reach Nurse Communication: Mobility status PT Visit Diagnosis: Difficulty in walking, not elsewhere classified (R26.2)    Time: 4835-0757 PT Time Calculation (min) (ACUTE ONLY): 19 min   Charges:   PT Evaluation $PT Eval Low Complexity: 1 Procedure     PT G CodesCarmelia Bake, PT, DPT 09/19/2016 Pager: [phone]   York Ram E 09/19/2016, 1:00 PM

## 2016-09-19 NOTE — Progress Notes (Signed)
Pt had not voided entire shift, bladder scan read 300 cc of urine in bladder. hospitalist notified. In and out ordered and performed. In and out was painful to pt, resistance met, no urine returned. Hospitalist notified of this as well. Pt eventually voided 250 cc into urinal.  

## 2016-09-19 NOTE — Progress Notes (Signed)
Dr Doyle Askew made aware C Diff +.  States she will enter orders. Pt and wife made aware and handout given.

## 2016-09-19 NOTE — Progress Notes (Signed)
Inpatient Diabetes Program Recommendations  AACE/ADA: New Consensus Statement on Inpatient Glycemic Control (2015)  Target Ranges:  Prepandial:   less than 140 mg/dL      Peak postprandial:   less than 180 mg/dL (1-2 hours)      Critically ill patients:  140 - 180 mg/dL   Results for Martin [name], Martin [name] (MRN [id number]) as of 09/19/2016 08:56  Ref. Range 09/18/2016 08:48 09/18/2016 17:02 09/18/2016 20:50 09/19/2016 07:36  Glucose-Capillary Latest Ref Range: 65 - 99 mg/dL 153 (H) 222 (H) 206 (H) 159 (H)    Home DM Meds: Lantus 320 units QAM  Current Insulin Orders: Novolog Sensitive Correction Scale/ SSI (0-9 units) TID AC     MD- Note patient takes an extremely large dose of Lantus at home.  This is a very unusual once daily regimen.    Note AM CBG today: 159 mg/dl.  Please consider the following:  1. Start a very small amount of Lantus- Recommend Lantus 14 units daily (0.1 units/kg dosing based on weight of 143 kg)  2. Increase Novolog SSI to Moderate scale (0-15 units)     --Will follow patient during hospitalization--  Wyn Quaker RN, MSN, CDE Diabetes Coordinator Inpatient Glycemic Control Team Team Pager: [phone] (8a-5p)

## 2016-09-19 NOTE — Progress Notes (Signed)
Ekg negative. VSS. Troponin negative, chest pressure relieved by gi cocktail.

## 2016-09-19 NOTE — Care Management Note (Signed)
Case Management Note  Patient Details  Name: Martin [name] MRN: [id number] Date of Birth: 1945/08/23  Subjective/Objective:                  71 y.o. male with medical history significant of insulin-dependent type 2 diabetes, hypertension, obesity, who underwent left cataract surgery a few days back, presented to the hospital for evaluation of persistent nausea, vomiting and diarrhea since yesterday. Per patient, over this past weekend he had some occasional nausea that spontaneously improved. Since yesterday he started having vomiting and diarrhea. Vomitus is nonbloody and nonbilious. Stools are loose without any blood or mucus in it. He denies any abdominal pain or fever (but does acknowledge sweats). He denies any sick contacts. Per spouse, he has gotten progressively weak, and has been having difficulty ambulation. Because of his persistent nature of his symptoms, he was brought to the emergency room, he was found to have acute kidney injury  Action/Plan: from home with spouse [id number] Rosana Hoes, BSN, RN3,CCM/[id number]: Chart reviewed for updates and status. CM following for discharge needs. Cm following for progression of hospital stay:  Expected Discharge Date:   (unknown)               Expected Discharge Plan:  Home/Self Care  In-House Referral:     Discharge planning Services  CM Consult  Post Acute Care Choice:    Choice offered to:     DME Arranged:    DME Agency:     HH Arranged:    HH Agency:     Status of Service:  In process, will continue to follow  If discussed at Long Length of Stay Meetings, dates discussed:    Additional Comments:  Leeroy Cha, RN 09/19/2016, 9:24 AM

## 2016-09-19 NOTE — Progress Notes (Signed)
Report from [place], Therapist, sports. Care assumed for pt at this time. Pt resting in bed, wife bedside. Pt w/ no c/o. Denies abd pain/nausea. Assessment unchanged from AM assessment. Will monitor.

## 2016-09-20 LAB — URINE CULTURE: Culture: NO GROWTH

## 2016-09-20 LAB — CBC
HEMATOCRIT: 36 % — AB (ref 39.0–52.0)
HEMOGLOBIN: 12 g/dL — AB (ref 13.0–17.0)
MCH: 30.2 pg (ref 26.0–34.0)
MCHC: 33.3 g/dL (ref 30.0–36.0)
MCV: 90.5 fL (ref 78.0–100.0)
Platelets: 194 10*3/uL (ref 150–400)
RBC: 3.98 MIL/uL — ABNORMAL LOW (ref 4.22–5.81)
RDW: 14.4 % (ref 11.5–15.5)
WBC: 12.8 10*3/uL — ABNORMAL HIGH (ref 4.0–10.5)

## 2016-09-20 LAB — BASIC METABOLIC PANEL
Anion gap: 7 (ref 5–15)
BUN: 27 mg/dL — ABNORMAL HIGH (ref 6–20)
CHLORIDE: 105 mmol/L (ref 101–111)
CO2: 24 mmol/L (ref 22–32)
CREATININE: 1.55 mg/dL — AB (ref 0.61–1.24)
Calcium: 8.1 mg/dL — ABNORMAL LOW (ref 8.9–10.3)
GFR calc Af Amer: 50 mL/min — ABNORMAL LOW (ref >60)
GFR calc non Af Amer: 43 mL/min — ABNORMAL LOW (ref >60)
GLUCOSE: 170 mg/dL — AB (ref 65–99)
Potassium: 4.8 mmol/L (ref 3.5–5.1)
Sodium: 136 mmol/L (ref 135–145)

## 2016-09-20 LAB — GLUCOSE, CAPILLARY
GLUCOSE-CAPILLARY: 221 mg/dL — AB (ref 65–99)
GLUCOSE-CAPILLARY: 191 mg/dL — AB (ref 65–99)
GLUCOSE-CAPILLARY: 176 mg/dL — AB (ref 65–99)
Glucose-Capillary: 163 mg/dL — ABNORMAL HIGH (ref 65–99)

## 2016-09-20 LAB — TROPONIN I: Troponin I: <0.03 ng/mL (ref <0.03)

## 2016-09-20 MED ORDER — INSULIN GLARGINE 100 UNIT/ML ~~LOC~~ SOLN
50.0000 [IU] | Freq: Every day | SUBCUTANEOUS | Status: DC
  Administered 2016-09-20 – 2016-09-25 (×5): 50 [IU] via SUBCUTANEOUS
  Filled 2016-09-20 (×6): qty 0.5

## 2016-09-20 NOTE — Progress Notes (Signed)
Patient ID: Martin [name], male   DOB: Apr 01, 1946, 71 y.o.   MRN: [id number]    PROGRESS NOTE   Martin [name] [facility]  KGU:[id number] DOB: 1946-04-10 DOA: 09/18/2016  PCP: Martin Olp, MD   Brief Narrative:  Pt is 71 yo male with known insulin dependent DM type II, HTN, [name] with main concern of 1-2 days duration of progressively worsening abd pain, watery diarrhea, nausea and poor oral intake.  Assessment & Plan:   Principal Problem:   Sepsis secondary to C. Diff  - C. Diff + - pt improving on oral vancomycin  - will continue same regimen for now, day #2 - d/w GI on call, agree with current regimen, no need for any intervention other than oral ABX at this time   Active Problems:   Acute kidney injury - in the setting of C. Diff colitis - improving with IVF - BMP in AM    Urinary retention - resolved     DM (diabetes mellitus) type II uncontrolled with eye manifestation, neuropathy  - keep on home regimen  - diabetic educator consulted     Essential hypertension - stable BP     Morbid obesity ([place]) - Body mass index is 42.72 kg/m.    Chronic diastolic CHF - last EF stable in 62/8315, grade I diastolic CHF - euvolemic at this time - stop IVF to see how pt does   DVT prophylaxis: Heparin SQ Code Status: Full  Family Communication: Patient and husband at bedside  Disposition Plan: home in 1-2 days   Consultants:   None  Procedures:   None  Antimicrobials:   Oral vancomycin 5/24 -->   Subjective: Pt reports feeling better.   Objective: Vitals:   09/19/16 1400 09/19/16 2112 09/20/16 0605 09/20/16 1431  BP: (!) 133/51 139/62 (!) 150/61 (!) 154/66  Pulse: 90 86 94 81  Resp:  18 18 18   Temp: 98.8 F (37.1 C) 98.9 F (37.2 C) 97.8 F (36.6 C) 98.1 F (36.7 C)  TempSrc: Oral Oral Oral Oral  SpO2: 94% 92% 98% 96%  Weight: (!) 142.9 kg (315 lb)     Height: 6' (1.829 m)       Intake/Output Summary (Last 24 hours) at 09/20/16 1457 Last  data filed at 09/20/16 1145  Gross per 24 hour  Intake             2315 ml  Output             2475 ml  Net             -160 ml   Filed Weights   09/19/16 1400  Weight: (!) 142.9 kg (315 lb)    Examination:  Physical Exam  Constitutional: Appears well-developed and well-nourished. No distress.  CVS: RRR, S1/S2 +, no murmurs, no gallops, no carotid bruit.  Pulmonary: Effort and breath sounds normal, no stridor, rhonchi, wheezes, rales.  Abdominal: Soft. BS +,  no distension, tenderness, rebound or guarding.  Musculoskeletal: Normal range of motion. No edema and no tenderness.   Data Reviewed: I have personally reviewed following labs and imaging studies  CBC:  Recent Labs Lab 09/18/16 1040 09/19/16 0545 09/20/16 0142  WBC 15.0* 15.2* 12.8*  NEUTROABS 11.9*  --   --   HGB 15.1 11.8* 12.0*  HCT 45.3 35.6* 36.0*  MCV 91.5 89.9 90.5  PLT 288 228 176   Basic Metabolic Panel:  Recent Labs Lab 09/18/16 1040 09/19/16 0545 09/20/16 0142  NA 138 133* 136  K 5.1 4.5 4.8  CL 106 102 105  CO2 23 24 24   GLUCOSE 217* 177* 170*  BUN 29* 40* 27*  CREATININE 1.93* 2.30* 1.55*  CALCIUM 8.3* 7.7* 8.1*   Liver Function Tests:  Recent Labs Lab 09/18/16 1040  AST 22  ALT 22  ALKPHOS 52  BILITOT 1.2  PROT 7.0  ALBUMIN 3.7    Recent Labs Lab 09/18/16 1040  LIPASE <10*   Cardiac Enzymes:  Recent Labs Lab 09/19/16 1410 09/19/16 1959 09/20/16 0142  TROPONINI <0.03 <0.03 <0.03   CBG:  Recent Labs Lab 09/19/16 0736 09/19/16 1123 09/19/16 1653 09/19/16 2119 09/20/16 1142  GLUCAP 159* 182* 170* 154* 221*   Urine analysis:    Component Value Date/Time   COLORURINE YELLOW 09/19/2016 1129   APPEARANCEUR CLEAR 09/19/2016 1129   LABSPEC 1.014 09/19/2016 1129   PHURINE 5.0 09/19/2016 1129   GLUCOSEU NEGATIVE 09/19/2016 1129   GLUCOSEU > or = 1000 mg/dL (AA) 07/11/2008 0734   HGBUR LARGE (A) 09/19/2016 1129   BILIRUBINUR NEGATIVE 09/19/2016 1129    BILIRUBINUR n 08/08/2015 1010   KETONESUR NEGATIVE 09/19/2016 1129   PROTEINUR NEGATIVE 09/19/2016 1129   UROBILINOGEN 0.2 08/08/2015 1010   UROBILINOGEN 0.2 mg/dL 07/11/2008 0734   NITRITE NEGATIVE 09/19/2016 1129   LEUKOCYTESUR NEGATIVE 09/19/2016 1129   Recent Results (from the past 240 hour(s))  Gastrointestinal Panel by PCR , Stool     Status: None   Collection Time: 09/19/16  4:47 AM  Result Value Ref Range Status   Campylobacter species NOT DETECTED NOT DETECTED Final   Plesimonas shigelloides NOT DETECTED NOT DETECTED Final   Salmonella species NOT DETECTED NOT DETECTED Final   Yersinia enterocolitica NOT DETECTED NOT DETECTED Final   Vibrio species NOT DETECTED NOT DETECTED Final   Vibrio cholerae NOT DETECTED NOT DETECTED Final   Enteroaggregative E coli (EAEC) NOT DETECTED NOT DETECTED Final   Enteropathogenic E coli (EPEC) NOT DETECTED NOT DETECTED Final   Enterotoxigenic E coli (ETEC) NOT DETECTED NOT DETECTED Final   Shiga like toxin producing E coli (STEC) NOT DETECTED NOT DETECTED Final   Shigella/Enteroinvasive E coli (EIEC) NOT DETECTED NOT DETECTED Final   Cryptosporidium NOT DETECTED NOT DETECTED Final   Cyclospora cayetanensis NOT DETECTED NOT DETECTED Final   Entamoeba histolytica NOT DETECTED NOT DETECTED Final   Giardia lamblia NOT DETECTED NOT DETECTED Final   Adenovirus F40/41 NOT DETECTED NOT DETECTED Final   Astrovirus NOT DETECTED NOT DETECTED Final   Norovirus GI/GII NOT DETECTED NOT DETECTED Final   Rotavirus A NOT DETECTED NOT DETECTED Final   Sapovirus (I, II, IV, and V) NOT DETECTED NOT DETECTED Final  C difficile quick scan w PCR reflex     Status: Abnormal   Collection Time: 09/19/16  4:47 AM  Result Value Ref Range Status   C Diff antigen POSITIVE (A) NEGATIVE Final   C Diff toxin NEGATIVE NEGATIVE Final   C Diff interpretation Results are indeterminate. See PCR results.  Final  Clostridium Difficile by PCR     Status: Abnormal   Collection  Time: 09/19/16  4:47 AM  Result Value Ref Range Status   Toxigenic C Difficile by pcr POSITIVE (A) NEGATIVE Final    Comment: Positive for toxigenic C. difficile with little to no toxin production. Only treat if clinical presentation suggests symptomatic illness. Performed at [facility], [place] [address], [place], [place] [address]   Culture, Urine  Status: None   Collection Time: 09/19/16 11:29 AM  Result Value Ref Range Status   Specimen Description URINE, RANDOM  Final   Special Requests NONE  Final   Culture   Final    NO GROWTH Performed at [facility], [address], [place], [place] [address]    Report Status 09/20/2016 FINAL  Final    Radiology Studies: No results found. Scheduled Meds: . Besifloxacin HCl  1 drop Left Eye TID  . Difluprednate  1 drop Left Eye TID  . heparin  5,000 Units Subcutaneous Q8H  . insulin aspart  0-9 Units Subcutaneous TID WC  . vancomycin  125 mg Oral QID   Continuous Infusions: . sodium chloride 75 mL/hr at 09/20/16 1149    LOS: 2 days   Time spent: 20 minutes   Faye Ramsay, MD [facility] Pager [phone]  If 7PM-7AM, please contact night-coverage [email or web address] Password [email or web address] 09/20/2016, 2:57 PM

## 2016-09-21 ENCOUNTER — Inpatient Hospital Stay (HOSPITAL_COMMUNITY): Payer: PPO

## 2016-09-21 LAB — GLUCOSE, CAPILLARY
GLUCOSE-CAPILLARY: 168 mg/dL — AB (ref 65–99)
GLUCOSE-CAPILLARY: 155 mg/dL — AB (ref 65–99)
Glucose-Capillary: 132 mg/dL — ABNORMAL HIGH (ref 65–99)
Glucose-Capillary: 160 mg/dL — ABNORMAL HIGH (ref 65–99)

## 2016-09-21 LAB — CBC
HCT: 34.8 % — ABNORMAL LOW (ref 39.0–52.0)
Hemoglobin: 11.6 g/dL — ABNORMAL LOW (ref 13.0–17.0)
MCH: 29.8 pg (ref 26.0–34.0)
MCHC: 33.3 g/dL (ref 30.0–36.0)
MCV: 89.5 fL (ref 78.0–100.0)
PLATELETS: 184 10*3/uL (ref 150–400)
RBC: 3.89 MIL/uL — ABNORMAL LOW (ref 4.22–5.81)
RDW: 14.3 % (ref 11.5–15.5)
WBC: 13.5 10*3/uL — AB (ref 4.0–10.5)

## 2016-09-21 LAB — BASIC METABOLIC PANEL
ANION GAP: 7 (ref 5–15)
BUN: 16 mg/dL (ref 6–20)
CO2: 22 mmol/L (ref 22–32)
CREATININE: 1.28 mg/dL — AB (ref 0.61–1.24)
Calcium: 8.2 mg/dL — ABNORMAL LOW (ref 8.9–10.3)
Chloride: 107 mmol/L (ref 101–111)
GFR, EST NON AFRICAN AMERICAN: 54 mL/min — AB (ref >60)
GLUCOSE: 138 mg/dL — AB (ref 65–99)
Potassium: 4 mmol/L (ref 3.5–5.1)
Sodium: 136 mmol/L (ref 135–145)

## 2016-09-21 MED ORDER — GUAIFENESIN-DM 100-10 MG/5ML PO SYRP
5.0000 mL | ORAL_SOLUTION | ORAL | Status: DC | PRN
  Administered 2016-09-21 – 2016-09-23 (×2): 5 mL via ORAL
  Filled 2016-09-21 (×2): qty 10

## 2016-09-21 MED ORDER — DEXTROSE 5 % IV SOLN
500.0000 mg | INTRAVENOUS | Status: DC
  Administered 2016-09-21 – 2016-09-22 (×2): 500 mg via INTRAVENOUS
  Filled 2016-09-21 (×3): qty 500

## 2016-09-21 MED ORDER — DEXTROSE 5 % IV SOLN
1.0000 g | INTRAVENOUS | Status: DC
  Administered 2016-09-21 – 2016-09-24 (×4): 1 g via INTRAVENOUS
  Filled 2016-09-21 (×5): qty 10

## 2016-09-21 NOTE — Progress Notes (Signed)
Patient ID: Martin [name], male   DOB: 1946/01/28, 71 y.o.   MRN: [id number]    PROGRESS NOTE  Martin [name]  [id number] DOB: [birth year] DOA: 09/18/2016  PCP: Marin Olp, MD   Brief Narrative:  Pt is 71 yo male with known insulin dependent DM type II, HTN, presented to [facility] ED with main concern of 1-2 days duration of progressively worsening abd pain, watery diarrhea, nausea and poor oral intake.  Assessment & Plan:   Principal Problem:   Sepsis secondary to C. Diff  - C. Diff + - pt with persistent diarrhea but says overall better  - currently on oral vancomycin day #3/14 - per GI team, no plan for colonoscopy at this time unless diarrhea does not improve   Active Problems:   Productive cough  - pt reports worsening cough this am with phlegm  - Ct chest confirms multifocal PNA - place on Zithromax and Rocephin IV, pneumonia order set in place - follow up on blood and sputum cultures, urine legionella and strep pneumo     Acute kidney injury - pre renal in etiology from significant volume loss in the setting of diarrhea - improving with IVF - BMP in AM    Urinary retention - no concern this AM    DM (diabetes mellitus) type II uncontrolled with eye manifestation, neuropathy  - CBG's so far stable  - pt still with poor oral intake     Essential hypertension - reasonable inpatient control     Morbid obesity ([place]) - Body mass index is 42.72 kg/m.    Chronic diastolic CHF - last EF stable in 96/7591, grade I diastolic CHF - remains euvolemic at this time   DVT prophylaxis: Heparin SQ Code Status: Full  Family Communication: Patient and wife at bedside  Disposition Plan: home once more medically stable   Consultants:   GI team over the phone  Procedures:   None  Antimicrobials:   Oral vancomycin 5/24 -->   Zithromax 5/26 -->  Rocephin 5/26 -->  Subjective: Pt reports feeling worse this AM, more cough, productive of yellow sputum.    Objective: Vitals:   09/20/16 0605 09/20/16 1431 09/20/16 2143 09/21/16 0710  BP: (!) 150/61 (!) 154/66 (!) 144/65 124/77  Pulse: 94 81 79 73  Resp: 18 18 19 19   Temp: 97.8 F (36.6 C) 98.1 F (36.7 C) 98.1 F (36.7 C) 97.8 F (36.6 C)  TempSrc: Oral Oral Oral Oral  SpO2: 98% 96% 94% 94%  Weight:      Height:        Intake/Output Summary (Last 24 hours) at 09/21/16 1139 Last data filed at 09/21/16 0900  Gross per 24 hour  Intake              360 ml  Output             1875 ml  Net            -1515 ml   Filed Weights   09/19/16 1400  Weight: (!) 142.9 kg (315 lb)    Examination:  Physical Exam  Constitutional: Appears calm, NAD CVS: RRR, S1/S2 +, no murmurs, no gallops, no carotid bruit.  Pulmonary: rhonchi at bases  Abdominal: Soft. BS +,  slightly distended, hyperactive bowel sounds  Musculoskeletal: Normal range of motion. No edema and no tenderness.  Neuro: Alert. Normal reflexes, muscle tone coordination. No cranial nerve deficit. Skin: Skin is warm and dry. No rash noted. Not diaphoretic.  No erythema. No pallor.  Psychiatric: Normal mood and affect. Behavior, judgment, thought content normal.   Data Reviewed: I have personally reviewed following labs and imaging studies  CBC:  Recent Labs Lab 09/18/16 1040 09/19/16 0545 09/20/16 0142 09/21/16 0636  WBC 15.0* 15.2* 12.8* 13.5*  NEUTROABS 11.9*  --   --   --   HGB 15.1 11.8* 12.0* 11.6*  HCT 45.3 35.6* 36.0* 34.8*  MCV 91.5 89.9 90.5 89.5  PLT 288 228 194 076   Basic Metabolic Panel:  Recent Labs Lab 09/18/16 1040 09/19/16 0545 09/20/16 0142 09/21/16 0636  NA 138 133* 136 136  K 5.1 4.5 4.8 4.0  CL 106 102 105 107  CO2 23 24 24 22   GLUCOSE 217* 177* 170* 138*  BUN 29* 40* 27* 16  CREATININE 1.93* 2.30* 1.55* 1.28*  CALCIUM 8.3* 7.7* 8.1* 8.2*   Liver Function Tests:  Recent Labs Lab 09/18/16 1040  AST 22  ALT 22  ALKPHOS 52  BILITOT 1.2  PROT 7.0  ALBUMIN 3.7    Recent  Labs Lab 09/18/16 1040  LIPASE <10*   Cardiac Enzymes:  Recent Labs Lab 09/19/16 1410 09/19/16 1959 09/20/16 0142  TROPONINI <0.03 <0.03 <0.03   CBG:  Recent Labs Lab 09/20/16 0722 09/20/16 1142 09/20/16 1619 09/20/16 2134 09/21/16 0746  GLUCAP 163* 221* 191* 176* 132*   Urine analysis:    Component Value Date/Time   COLORURINE YELLOW 09/19/2016 [place] 09/19/2016 1129   LABSPEC 1.014 09/19/2016 1129   PHURINE 5.0 09/19/2016 1129   GLUCOSEU NEGATIVE 09/19/2016 1129   GLUCOSEU > or = 1000 mg/dL (AA) 07/11/2008 0734   HGBUR LARGE (A) 09/19/2016 1129   BILIRUBINUR NEGATIVE 09/19/2016 1129   BILIRUBINUR n 08/08/2015 1010   KETONESUR NEGATIVE 09/19/2016 1129   PROTEINUR NEGATIVE 09/19/2016 1129   UROBILINOGEN 0.2 08/08/2015 1010   UROBILINOGEN 0.2 mg/dL 07/11/2008 0734   NITRITE NEGATIVE 09/19/2016 1129   LEUKOCYTESUR NEGATIVE 09/19/2016 1129   Recent Results (from the past 240 hour(s))  Gastrointestinal Panel by PCR , Stool     Status: None   Collection Time: 09/19/16  4:47 AM  Result Value Ref Range Status   Campylobacter species NOT DETECTED NOT DETECTED Final   Plesimonas shigelloides NOT DETECTED NOT DETECTED Final   Salmonella species NOT DETECTED NOT DETECTED Final   Yersinia enterocolitica NOT DETECTED NOT DETECTED Final   Vibrio species NOT DETECTED NOT DETECTED Final   Vibrio cholerae NOT DETECTED NOT DETECTED Final   Enteroaggregative E coli (EAEC) NOT DETECTED NOT DETECTED Final   Enteropathogenic E coli (EPEC) NOT DETECTED NOT DETECTED Final   Enterotoxigenic E coli (ETEC) NOT DETECTED NOT DETECTED Final   Shiga like toxin producing E coli (STEC) NOT DETECTED NOT DETECTED Final   Shigella/Enteroinvasive E coli (EIEC) NOT DETECTED NOT DETECTED Final   Cryptosporidium NOT DETECTED NOT DETECTED Final   Cyclospora cayetanensis NOT DETECTED NOT DETECTED Final   Entamoeba histolytica NOT DETECTED NOT DETECTED Final   Giardia lamblia  NOT DETECTED NOT DETECTED Final   Adenovirus F40/41 NOT DETECTED NOT DETECTED Final   Astrovirus NOT DETECTED NOT DETECTED Final   Norovirus GI/GII NOT DETECTED NOT DETECTED Final   Rotavirus A NOT DETECTED NOT DETECTED Final   Sapovirus (I, II, IV, and V) NOT DETECTED NOT DETECTED Final  C difficile quick scan w PCR reflex     Status: Abnormal   Collection Time: 09/19/16  4:47 AM  Result Value Ref Range Status  C Diff antigen POSITIVE (A) NEGATIVE Final   C Diff toxin NEGATIVE NEGATIVE Final   C Diff interpretation Results are indeterminate. See PCR results.  Final  Clostridium Difficile by PCR     Status: Abnormal   Collection Time: 09/19/16  4:47 AM  Result Value Ref Range Status   Toxigenic C Difficile by pcr POSITIVE (A) NEGATIVE Final    Comment: Positive for toxigenic C. difficile with little to no toxin production. Only treat if clinical presentation suggests symptomatic illness. Performed at [facility], [place] [address] [place], [place] [address]   Culture, Urine     Status: None   Collection Time: 09/19/16 11:29 AM  Result Value Ref Range Status   Specimen Description URINE, RANDOM  Final   Special Requests NONE  Final   Culture   Final    NO GROWTH Performed at [facility], [place] [address], [place], [place] [address]    Report Status 09/20/2016 FINAL  Final    Radiology Studies: Ct Chest Wo Contrast  Result Date: 09/21/2016 CLINICAL DATA:  71 year old male with a history of cough EXAM: CT CHEST WITHOUT CONTRAST TECHNIQUE: Multidetector CT imaging of the chest was performed following the standard protocol without IV contrast. COMPARISON:  Chest x-ray 09/18/2016 FINDINGS: Cardiovascular: Heart size within normal limits. No significant pericardial fluid/ thickening. Calcifications of left main, right coronary arteries. Unremarkable course caliber and contour of the thoracic aorta. Minimal calcifications. Unremarkable size of the main pulmonary artery.  Mediastinum/Nodes: There are several mediastinal lymph nodes, none of which are enlarged by CT size criteria. Predominant nodes within the right sided peribronchial and hilar nodes. Lungs/Pleura: Airspace disease of the right lower lobe in a peribronchovascular distribution. Small pleural effusion. Minimal airspace opacity of the right middle lobe with mixed ground-glass and nodular opacity. Bronchial wall thickening. Minimal ground-glass opacity at the left base. Upper Abdomen: Unremarkable appearance of the upper abdomen Musculoskeletal: No displaced fracture. No significant degenerative changes. IMPRESSION: Evidence of multifocal pneumonia, predominantly of the right lower lobe with small parapneumonic effusion. Reactive lymph nodes of the hilar region and mediastinum. Followup PA and lateral chest X-ray is recommended in 3-4 weeks following therapy to assure resolution. Aortic atherosclerosis and coronary artery disease. Electronically Signed   By: Corrie Mckusick D.O.   On: 09/21/2016 10:34   Scheduled Meds: . Besifloxacin HCl  1 drop Left Eye TID  . Difluprednate  1 drop Left Eye TID  . heparin  5,000 Units Subcutaneous Q8H  . insulin aspart  0-9 Units Subcutaneous TID WC  . insulin glargine  50 Units Subcutaneous QHS  . vancomycin  125 mg Oral QID   Continuous Infusions: . azithromycin    . cefTRIAXone (ROCEPHIN)  IV      LOS: 3 days   Time spent: 20 minutes   Faye Ramsay, MD [facility] Pager [phone]  If 7PM-7AM, please contact night-coverage [email or web address] Password [facility] 09/21/2016, 11:39 AM

## 2016-09-22 LAB — CBC
HEMATOCRIT: 34.6 % — AB (ref 39.0–52.0)
Hemoglobin: 11.8 g/dL — ABNORMAL LOW (ref 13.0–17.0)
MCH: 30.3 pg (ref 26.0–34.0)
MCHC: 34.1 g/dL (ref 30.0–36.0)
MCV: 88.7 fL (ref 78.0–100.0)
Platelets: 193 10*3/uL (ref 150–400)
RBC: 3.9 MIL/uL — AB (ref 4.22–5.81)
RDW: 14.1 % (ref 11.5–15.5)
WBC: 14.5 10*3/uL — AB (ref 4.0–10.5)

## 2016-09-22 LAB — GLUCOSE, CAPILLARY
GLUCOSE-CAPILLARY: 168 mg/dL — AB (ref 65–99)
GLUCOSE-CAPILLARY: 205 mg/dL — AB (ref 65–99)
Glucose-Capillary: 246 mg/dL — ABNORMAL HIGH (ref 65–99)
Glucose-Capillary: 156 mg/dL — ABNORMAL HIGH (ref 65–99)

## 2016-09-22 LAB — BASIC METABOLIC PANEL
ANION GAP: 9 (ref 5–15)
BUN: 13 mg/dL (ref 6–20)
CHLORIDE: 104 mmol/L (ref 101–111)
CO2: 23 mmol/L (ref 22–32)
CREATININE: 1.27 mg/dL — AB (ref 0.61–1.24)
Calcium: 8.5 mg/dL — ABNORMAL LOW (ref 8.9–10.3)
GFR calc non Af Amer: 55 mL/min — ABNORMAL LOW (ref >60)
Glucose, Bld: 168 mg/dL — ABNORMAL HIGH (ref 65–99)
Potassium: 4.3 mmol/L (ref 3.5–5.1)
Sodium: 136 mmol/L (ref 135–145)

## 2016-09-22 MED ORDER — HYDRALAZINE HCL 20 MG/ML IJ SOLN
5.0000 mg | INTRAMUSCULAR | Status: DC | PRN
  Administered 2016-09-25 – 2016-09-26 (×3): 5 mg via INTRAVENOUS
  Filled 2016-09-22 (×3): qty 0.25

## 2016-09-22 NOTE — Progress Notes (Signed)
PT Note  Patient Details Name: Martin [name] MRN: [id number] DOB: 08-Feb-1946   Cancelled Treatment:     Pt seen for one time PT eval 09/19/16 with no PT needs identified at that time.  This date, pt states he continues to mobilize in room and to bathroom unassisted and has no need of PT intervention - RN and pt's spouse in agreement.  PT services to be dc at this time.   Jeanette Moffatt 09/22/2016, 12:18 PM

## 2016-09-22 NOTE — Progress Notes (Signed)
Please note that pt is receiving Zithromax IV at a lower rate 75 cc/hr due to nausea issues with medication. MD is aware. Please give Zofran IV prior to pt receiving Zithromax IV.

## 2016-09-22 NOTE — Progress Notes (Signed)
Patient ID: Martin [name], male   DOB: Mar 30, 1946, 71 y.o.   MRN: [id number]    PROGRESS NOTE  Martin [name] [facility]  [id number] DOB: Jan 15, 1946 DOA: 09/18/2016  PCP: Marin Olp, MD   Brief Narrative:  Pt is 71 yo male with known insulin dependent DM type II, HTN, presented to [facility] ED with main concern of 1-2 days duration of progressively worsening abd pain, watery diarrhea, nausea and poor oral intake.  Major events since admission: 5/26 - more cough, CT chest with multifocal PNA 5/27 - less diarrhea, still coughing, WBC still up  Assessment & Plan:   Principal Problem:   Sepsis secondary to C. Diff  - C. Diff + - less diarrhea in the past 24 hours - continue oral vanc day #4/14 - monitor clinical progress  Active Problems:   Multifocal PNA - unknown pathogen - continue Rocephin and Zithromax day #2 - f/u on sputum and blood cultures, urine legionella and strep pneumo     Acute kidney injury - pre renal in etiology from significant volume loss in the setting of diarrhea - stable in the past 24 hours - BMP in AM    Urinary retention - resolved     DM (diabetes mellitus) type II uncontrolled with eye manifestation, neuropathy  - continue Lantus and SSI    Essential hypertension - SBP in 160's, will add hydralazine as needed - I suspect once C. Diff and PNA improves, BP will stabilize     Morbid obesity ([facility]) - Body mass index is 42.72 kg/m.    Chronic diastolic CHF - last EF stable in 43/3295, grade I diastolic CHF - no signs of volume overload at this time - daily weights, I/o monitoring   DVT prophylaxis: Heparin SQ Code Status: Full  Family Communication: Patient and wife at bedside  Disposition Plan: not ready for d/c yet   Consultants:   GI team over the phone  PCCM over the phone   Procedures:   None  Antimicrobials:   Oral vancomycin 5/24 -->   Zithromax 5/26 -->  Rocephin 5/26 -->  Subjective: Pt reports persistent cough, less  diarrhea. Some improvement.   Objective: Vitals:   09/21/16 1500 09/21/16 2108 09/22/16 0538 09/22/16 0610  BP: (!) 155/73 (!) 156/69 (!) 169/79 (!) 160/78  Pulse: 75 66 86   Resp: 20 18 18    Temp: 98.8 F (37.1 C) 99.3 F (37.4 C) 99.3 F (37.4 C)   TempSrc: Oral Oral Oral   SpO2: 94% 94% 91%   Weight:      Height:        Intake/Output Summary (Last 24 hours) at 09/22/16 1101 Last data filed at 09/22/16 0900  Gross per 24 hour  Intake              580 ml  Output             1700 ml  Net            -1120 ml   Filed Weights   09/19/16 1400  Weight: (!) 142.9 kg (315 lb)    Examination: Physical Exam  Constitutional: Appears well-developed and well-nourished. No distress.  CVS: RRR, S1/S2 +, no murmurs, no gallops, no carotid bruit.  Pulmonary: Rhonchi at bases, worse on the right side  Abdominal: Soft. BS +,  no distension, tenderness, rebound or guarding.  Musculoskeletal: Normal range of motion.  Neuro: Alert. Normal reflexes, muscle tone coordination. No cranial nerve deficit.  Data Reviewed:  I have personally reviewed following labs and imaging studies  CBC:  Recent Labs Lab 09/18/16 1040 09/19/16 0545 09/20/16 0142 09/21/16 0636 09/22/16 0629  WBC 15.0* 15.2* 12.8* 13.5* 14.5*  NEUTROABS 11.9*  --   --   --   --   HGB 15.1 11.8* 12.0* 11.6* 11.8*  HCT 45.3 35.6* 36.0* 34.8* 34.6*  MCV 91.5 89.9 90.5 89.5 88.7  PLT 288 228 194 184 956   Basic Metabolic Panel:  Recent Labs Lab 09/18/16 1040 09/19/16 0545 09/20/16 0142 09/21/16 0636 09/22/16 0629  NA 138 133* 136 136 136  K 5.1 4.5 4.8 4.0 4.3  CL 106 102 105 107 104  CO2 23 24 24 22 23   GLUCOSE 217* 177* 170* 138* 168*  BUN 29* 40* 27* 16 13  CREATININE 1.93* 2.30* 1.55* 1.28* 1.27*  CALCIUM 8.3* 7.7* 8.1* 8.2* 8.5*   Liver Function Tests:  Recent Labs Lab 09/18/16 1040  AST 22  ALT 22  ALKPHOS 52  BILITOT 1.2  PROT 7.0  ALBUMIN 3.7    Recent Labs Lab 09/18/16 1040  LIPASE  <10*   Cardiac Enzymes:  Recent Labs Lab 09/19/16 1410 09/19/16 1959 09/20/16 0142  TROPONINI <0.03 <0.03 <0.03   CBG:  Recent Labs Lab 09/21/16 0746 09/21/16 1220 09/21/16 1716 09/21/16 2059 09/22/16 0759  GLUCAP 132* 168* 155* 160* 168*   Urine analysis:    Component Value Date/Time   COLORURINE YELLOW 09/19/2016 1129   APPEARANCEUR CLEAR 09/19/2016 1129   LABSPEC 1.014 09/19/2016 1129   PHURINE 5.0 09/19/2016 1129   GLUCOSEU NEGATIVE 09/19/2016 1129   GLUCOSEU > or = 1000 mg/dL (AA) 07/11/2008 0734   HGBUR LARGE (A) 09/19/2016 1129   BILIRUBINUR NEGATIVE 09/19/2016 1129   BILIRUBINUR n 08/08/2015 1010   KETONESUR NEGATIVE 09/19/2016 1129   PROTEINUR NEGATIVE 09/19/2016 1129   UROBILINOGEN 0.2 08/08/2015 1010   UROBILINOGEN 0.2 mg/dL 07/11/2008 0734   NITRITE NEGATIVE 09/19/2016 1129   LEUKOCYTESUR NEGATIVE 09/19/2016 1129   Recent Results (from the past 240 hour(s))  Gastrointestinal Panel by PCR , Stool     Status: None   Collection Time: 09/19/16  4:47 AM  Result Value Ref Range Status   Campylobacter species NOT DETECTED NOT DETECTED Final   Plesimonas shigelloides NOT DETECTED NOT DETECTED Final   Salmonella species NOT DETECTED NOT DETECTED Final   Yersinia enterocolitica NOT DETECTED NOT DETECTED Final   Vibrio species NOT DETECTED NOT DETECTED Final   Vibrio cholerae NOT DETECTED NOT DETECTED Final   Enteroaggregative E coli (EAEC) NOT DETECTED NOT DETECTED Final   Enteropathogenic E coli (EPEC) NOT DETECTED NOT DETECTED Final   Enterotoxigenic E coli (ETEC) NOT DETECTED NOT DETECTED Final   Shiga like toxin producing E coli (STEC) NOT DETECTED NOT DETECTED Final   Shigella/Enteroinvasive E coli (EIEC) NOT DETECTED NOT DETECTED Final   Cryptosporidium NOT DETECTED NOT DETECTED Final   Cyclospora cayetanensis NOT DETECTED NOT DETECTED Final   Entamoeba histolytica NOT DETECTED NOT DETECTED Final   Giardia lamblia NOT DETECTED NOT DETECTED Final    Adenovirus F40/41 NOT DETECTED NOT DETECTED Final   Astrovirus NOT DETECTED NOT DETECTED Final   Norovirus GI/GII NOT DETECTED NOT DETECTED Final   Rotavirus A NOT DETECTED NOT DETECTED Final   Sapovirus (I, II, IV, and V) NOT DETECTED NOT DETECTED Final  C difficile quick scan w PCR reflex     Status: Abnormal   Collection Time: 09/19/16  4:47 AM  Result Value Ref Range  Status   C Diff antigen POSITIVE (A) NEGATIVE Final   C Diff toxin NEGATIVE NEGATIVE Final   C Diff interpretation Results are indeterminate. See PCR results.  Final  Clostridium Difficile by PCR     Status: Abnormal   Collection Time: 09/19/16  4:47 AM  Result Value Ref Range Status   Toxigenic C Difficile by pcr POSITIVE (A) NEGATIVE Final    Comment: Positive for toxigenic C. difficile with little to no toxin production. Only treat if clinical presentation suggests symptomatic illness. Performed at [facility], [place] [address], [place], [place] [address]   Culture, Urine     Status: None   Collection Time: 09/19/16 11:29 AM  Result Value Ref Range Status   Specimen Description URINE, RANDOM  Final   Special Requests NONE  Final   Culture   Final    NO GROWTH Performed at [facility], [place] [address], [place], [place] [address]    Report Status 09/20/2016 FINAL  Final    Radiology Studies: Ct Chest Wo Contrast  Result Date: 09/21/2016 CLINICAL DATA:  71 year old male with a history of cough EXAM: CT CHEST WITHOUT CONTRAST TECHNIQUE: Multidetector CT imaging of the chest was performed following the standard protocol without IV contrast. COMPARISON:  Chest x-ray 09/18/2016 FINDINGS: Cardiovascular: Heart size within normal limits. No significant pericardial fluid/ thickening. Calcifications of left main, right coronary arteries. Unremarkable course caliber and contour of the thoracic aorta. Minimal calcifications. Unremarkable size of the main pulmonary artery. Mediastinum/Nodes: There are several  mediastinal lymph nodes, none of which are enlarged by CT size criteria. Predominant nodes within the right sided peribronchial and hilar nodes. Lungs/Pleura: Airspace disease of the right lower lobe in a peribronchovascular distribution. Small pleural effusion. Minimal airspace opacity of the right middle lobe with mixed ground-glass and nodular opacity. Bronchial wall thickening. Minimal ground-glass opacity at the left base. Upper Abdomen: Unremarkable appearance of the upper abdomen Musculoskeletal: No displaced fracture. No significant degenerative changes. IMPRESSION: Evidence of multifocal pneumonia, predominantly of the right lower lobe with small parapneumonic effusion. Reactive lymph nodes of the hilar region and mediastinum. Followup PA and lateral chest X-ray is recommended in 3-4 weeks following therapy to assure resolution. Aortic atherosclerosis and coronary artery disease. Electronically Signed   By: Corrie Mckusick D.O.   On: 09/21/2016 10:34   Scheduled Meds: . Besifloxacin HCl  1 drop Left Eye TID  . Difluprednate  1 drop Left Eye TID  . heparin  5,000 Units Subcutaneous Q8H  . insulin aspart  0-9 Units Subcutaneous TID WC  . insulin glargine  50 Units Subcutaneous QHS  . vancomycin  125 mg Oral QID   Continuous Infusions: . azithromycin Stopped (09/21/16 1500)  . cefTRIAXone (ROCEPHIN)  IV Stopped (09/21/16 1312)    LOS: 4 days   Time spent: 20 minutes   Faye Ramsay, MD [facility] Pager [phone]  If 7PM-7AM, please contact night-coverage [email or web address] Password [email or web address] 09/22/2016, 11:01 AM

## 2016-09-23 LAB — BASIC METABOLIC PANEL
ANION GAP: 10 (ref 5–15)
BUN: 12 mg/dL (ref 6–20)
CHLORIDE: 103 mmol/L (ref 101–111)
CO2: 25 mmol/L (ref 22–32)
Calcium: 8.6 mg/dL — ABNORMAL LOW (ref 8.9–10.3)
Creatinine, Ser: 1.37 mg/dL — ABNORMAL HIGH (ref 0.61–1.24)
GFR calc Af Amer: 58 mL/min — ABNORMAL LOW (ref >60)
GFR calc non Af Amer: 50 mL/min — ABNORMAL LOW (ref >60)
GLUCOSE: 182 mg/dL — AB (ref 65–99)
Potassium: 3.9 mmol/L (ref 3.5–5.1)
Sodium: 138 mmol/L (ref 135–145)

## 2016-09-23 LAB — CBC
HCT: 36 % — ABNORMAL LOW (ref 39.0–52.0)
HEMOGLOBIN: 11.8 g/dL — AB (ref 13.0–17.0)
MCH: 29.6 pg (ref 26.0–34.0)
MCHC: 32.8 g/dL (ref 30.0–36.0)
MCV: 90.2 fL (ref 78.0–100.0)
Platelets: 187 10*3/uL (ref 150–400)
RBC: 3.99 MIL/uL — ABNORMAL LOW (ref 4.22–5.81)
RDW: 14.3 % (ref 11.5–15.5)
WBC: 14.9 10*3/uL — ABNORMAL HIGH (ref 4.0–10.5)

## 2016-09-23 LAB — GLUCOSE, CAPILLARY
GLUCOSE-CAPILLARY: 240 mg/dL — AB (ref 65–99)
GLUCOSE-CAPILLARY: 235 mg/dL — AB (ref 65–99)
Glucose-Capillary: 174 mg/dL — ABNORMAL HIGH (ref 65–99)

## 2016-09-23 MED ORDER — AZITHROMYCIN 250 MG PO TABS
500.0000 mg | ORAL_TABLET | Freq: Every day | ORAL | Status: DC
  Administered 2016-09-23 – 2016-09-25 (×3): 500 mg via ORAL
  Filled 2016-09-23 (×3): qty 2

## 2016-09-23 NOTE — Progress Notes (Signed)
Patient ID: Martin [name], male   DOB: 11-Oct-1945, 71 y.o.   MRN: [id number]    PROGRESS NOTE  Martin [name] [facility]  PYP:[id number] DOB: Jun 09, 1945 DOA: 09/18/2016  PCP: Martin Olp, MD   Brief Narrative:  Pt is 71 yo male with known insulin dependent DM type II, HTN, presented to [facility] ED with main concern of 1-2 days duration of progressively worsening abd pain, watery diarrhea, nausea and poor oral intake.  Major events since admission: 5/26 - more cough, CT chest with multifocal PNA 5/27 - less diarrhea, still coughing, WBC still up  Assessment & Plan:   Principal Problem:   Sepsis secondary to C. Diff  - C. Diff + - continue oral vanc day #5/14 - report improvement of diarrhea. No BM today      Multifocal PNA - unknown pathogen - continue Rocephin and Zithromax day #3 - f/u on sputum and blood cultures, urine legionella and strep pneumo  -cough and dyspnea improved.     Acute kidney injury - pre renal in etiology from significant volume loss in the setting of diarrhea - stable.      Urinary retention - resolved     DM (diabetes mellitus) type II uncontrolled with eye manifestation, neuropathy  - continue Lantus and SSI    Essential hypertension - SBP in 160's, will add hydralazine as needed    Morbid obesity ([place]) - Body mass index is 42.72 kg/m.    Chronic diastolic CHF - last EF stable in 24/5809, grade I diastolic CHF - no signs of volume overload at this time   DVT prophylaxis: Heparin SQ Code Status: Full  Family Communication: Patient and wife at bedside  Disposition Plan: not ready for d/c yet   Consultants:   GI team over the phone  PCCM over the phone   Procedures:   None  Antimicrobials:   Oral vancomycin 5/24 -->   Zithromax 5/26 -->  Rocephin 5/26 -->  Subjective: He is feeling better, less diarrhea. Still with coughing spell specially at night   Objective: Vitals:   09/22/16 2027 09/22/16 2136 09/23/16 0512 09/23/16 1345    BP: (!) 161/71 (!) 156/69 (!) 155/83 (!) 158/69  Pulse: 75  78 76  Resp: 18  20 (!) 24  Temp: 98.4 F (36.9 C)  97.5 F (36.4 C) 98.4 F (36.9 C)  TempSrc: Oral  Oral Oral  SpO2: 93%  94% 94%  Weight:      Height:        Intake/Output Summary (Last 24 hours) at 09/23/16 1347 Last data filed at 09/23/16 1210  Gross per 24 hour  Intake              950 ml  Output             1925 ml  Net             -975 ml   Filed Weights   09/19/16 1400  Weight: (!) 142.9 kg (315 lb)    Examination: Physical Exam  Constitutional: no acute distress.  CVS: RRR, S1/S2 +, no murmurs, no gallops, no carotid bruit.  Pulmonary: Rhonchi at bases, worse on the right side  Abdominal: Soft, mild distended, nr Musculoskeletal: Normal range of motion.  Neuro: alert , non focal.   Data Reviewed: I have personally reviewed following labs and imaging studies  CBC:  Recent Labs Lab 09/18/16 1040 09/19/16 0545 09/20/16 0142 09/21/16 0636 09/22/16 0629 09/23/16 0543  WBC 15.0* 15.2*  12.8* 13.5* 14.5* 14.9*  NEUTROABS 11.9*  --   --   --   --   --   HGB 15.1 11.8* 12.0* 11.6* 11.8* 11.8*  HCT 45.3 35.6* 36.0* 34.8* 34.6* 36.0*  MCV 91.5 89.9 90.5 89.5 88.7 90.2  PLT 288 228 194 184 193 983   Basic Metabolic Panel:  Recent Labs Lab 09/19/16 0545 09/20/16 0142 09/21/16 0636 09/22/16 0629 09/23/16 0543  NA 133* 136 136 136 138  K 4.5 4.8 4.0 4.3 3.9  CL 102 105 107 104 103  CO2 24 24 22 23 25   GLUCOSE 177* 170* 138* 168* 182*  BUN 40* 27* 16 13 12   CREATININE 2.30* 1.55* 1.28* 1.27* 1.37*  CALCIUM 7.7* 8.1* 8.2* 8.5* 8.6*   Liver Function Tests:  Recent Labs Lab 09/18/16 1040  AST 22  ALT 22  ALKPHOS 52  BILITOT 1.2  PROT 7.0  ALBUMIN 3.7    Recent Labs Lab 09/18/16 1040  LIPASE <10*   Cardiac Enzymes:  Recent Labs Lab 09/19/16 1410 09/19/16 1959 09/20/16 0142  TROPONINI <0.03 <0.03 <0.03   CBG:  Recent Labs Lab 09/22/16 1156 09/22/16 1650  09/22/16 2021 09/23/16 0739 09/23/16 1131  GLUCAP 205* 246* 156* 174* 240*   Urine analysis:    Component Value Date/Time   COLORURINE YELLOW 09/19/2016 [place] 09/19/2016 1129   LABSPEC 1.014 09/19/2016 1129   PHURINE 5.0 09/19/2016 1129   [place] 09/19/2016 1129   GLUCOSEU > or = 1000 mg/dL (AA) 07/11/2008 0734   HGBUR LARGE (A) 09/19/2016 1129   BILIRUBINUR NEGATIVE 09/19/2016 1129   BILIRUBINUR n 08/08/2015 1010   KETONESUR NEGATIVE 09/19/2016 1129   PROTEINUR NEGATIVE 09/19/2016 1129   UROBILINOGEN 0.2 08/08/2015 1010   UROBILINOGEN 0.2 mg/dL 07/11/2008 0734   NITRITE NEGATIVE 09/19/2016 1129   LEUKOCYTESUR NEGATIVE 09/19/2016 1129   Recent Results (from the past 240 hour(s))  Gastrointestinal Panel by PCR , Stool     Status: None   Collection Time: 09/19/16  4:47 AM  Result Value Ref Range Status   Campylobacter species NOT DETECTED NOT DETECTED Final   Plesimonas shigelloides NOT DETECTED NOT DETECTED Final   Salmonella species NOT DETECTED NOT DETECTED Final   Yersinia enterocolitica NOT DETECTED NOT DETECTED Final   Vibrio species NOT DETECTED NOT DETECTED Final   Vibrio cholerae NOT DETECTED NOT DETECTED Final   Enteroaggregative E coli (EAEC) NOT DETECTED NOT DETECTED Final   Enteropathogenic E coli (EPEC) NOT DETECTED NOT DETECTED Final   Enterotoxigenic E coli (ETEC) NOT DETECTED NOT DETECTED Final   Shiga like toxin producing E coli (STEC) NOT DETECTED NOT DETECTED Final   Shigella/Enteroinvasive E coli (EIEC) NOT DETECTED NOT DETECTED Final   Cryptosporidium NOT DETECTED NOT DETECTED Final   Cyclospora cayetanensis NOT DETECTED NOT DETECTED Final   Entamoeba histolytica NOT DETECTED NOT DETECTED Final   Giardia lamblia NOT DETECTED NOT DETECTED Final   Adenovirus F40/41 NOT DETECTED NOT DETECTED Final   Astrovirus NOT DETECTED NOT DETECTED Final   Norovirus GI/GII NOT DETECTED NOT DETECTED Final   Rotavirus A NOT DETECTED NOT  DETECTED Final   Sapovirus (I, II, IV, and V) NOT DETECTED NOT DETECTED Final  C difficile quick scan w PCR reflex     Status: Abnormal   Collection Time: 09/19/16  4:47 AM  Result Value Ref Range Status   C Diff antigen POSITIVE (A) NEGATIVE Final   C Diff toxin NEGATIVE NEGATIVE Final   C  Diff interpretation Results are indeterminate. See PCR results.  Final  Clostridium Difficile by PCR     Status: Abnormal   Collection Time: 09/19/16  4:47 AM  Result Value Ref Range Status   Toxigenic C Difficile by pcr POSITIVE (A) NEGATIVE Final    Comment: Positive for toxigenic C. difficile with little to no toxin production. Only treat if clinical presentation suggests symptomatic illness. Performed at [facility], [place] [address], [place], [place] [address]   Culture, Urine     Status: None   Collection Time: 09/19/16 11:29 AM  Result Value Ref Range Status   Specimen Description URINE, RANDOM  Final   Special Requests NONE  Final   Culture   Final    NO GROWTH Performed at [facility], [place] [address], [place], [place] [address]    Report Status 09/20/2016 FINAL  Final  Culture, blood (routine x 2) Call MD if unable to obtain prior to antibiotics being given     Status: None (Preliminary result)   Collection Time: 09/21/16 11:59 AM  Result Value Ref Range Status   Specimen Description BLOOD RIGHT ARM  Final   Special Requests   Final    BOTTLES DRAWN AEROBIC AND ANAEROBIC Blood Culture adequate volume   Culture   Final    NO GROWTH 2 DAYS Performed at [facility], [place] [address], [place], [place] [address]    Report Status PENDING  Incomplete  Culture, blood (routine x 2) Call MD if unable to obtain prior to antibiotics being given     Status: None (Preliminary result)   Collection Time: 09/21/16 12:08 PM  Result Value Ref Range Status   Specimen Description BLOOD RIGHT HAND  Final   Special Requests IN PEDIATRIC BOTTLE Blood Culture adequate volume  Final    Culture   Final    NO GROWTH 2 DAYS Performed at [facility], [place] [address] [place], [place] [address]    Report Status PENDING  Incomplete    Radiology Studies: No results found. Scheduled Meds: . azithromycin  500 mg Oral Daily  . Besifloxacin HCl  1 drop Left Eye TID  . Difluprednate  1 drop Left Eye TID  . heparin  5,000 Units Subcutaneous Q8H  . insulin aspart  0-9 Units Subcutaneous TID WC  . insulin glargine  50 Units Subcutaneous QHS  . vancomycin  125 mg Oral QID   Continuous Infusions: . cefTRIAXone (ROCEPHIN)  IV 1 g (09/23/16 1339)    LOS: 5 days   Time spent: 35 minutes   Elmarie Shiley, MD [facility] Pager [phone]  If 7PM-7AM, please contact night-coverage [email or web address] Password [email or web address] 09/23/2016, 1:47 PM

## 2016-09-23 NOTE — Progress Notes (Signed)
Date:  Sep 23, 2016  Chart reviewed for concurrent status and case management needs.  Will continue to follow patient progress.  Discharge Planning: following for needs  Expected discharge date: [id number]  Velva Harman, BSN, [place], [place]

## 2016-09-24 ENCOUNTER — Inpatient Hospital Stay (HOSPITAL_COMMUNITY): Payer: PPO

## 2016-09-24 LAB — CBC
HEMATOCRIT: 36.3 % — AB (ref 39.0–52.0)
HEMOGLOBIN: 12 g/dL — AB (ref 13.0–17.0)
MCH: 29.5 pg (ref 26.0–34.0)
MCHC: 33.1 g/dL (ref 30.0–36.0)
MCV: 89.2 fL (ref 78.0–100.0)
Platelets: 178 10*3/uL (ref 150–400)
RBC: 4.07 MIL/uL — ABNORMAL LOW (ref 4.22–5.81)
RDW: 14.1 % (ref 11.5–15.5)
WBC: 15.8 10*3/uL — ABNORMAL HIGH (ref 4.0–10.5)

## 2016-09-24 LAB — EXPECTORATED SPUTUM ASSESSMENT W GRAM STAIN, RFLX TO RESP C

## 2016-09-24 LAB — BASIC METABOLIC PANEL
Anion gap: 8 (ref 5–15)
BUN: 12 mg/dL (ref 6–20)
CALCIUM: 8.9 mg/dL (ref 8.9–10.3)
CHLORIDE: 103 mmol/L (ref 101–111)
CO2: 27 mmol/L (ref 22–32)
CREATININE: 1.23 mg/dL (ref 0.61–1.24)
GFR calc non Af Amer: 57 mL/min — ABNORMAL LOW (ref >60)
Glucose, Bld: 184 mg/dL — ABNORMAL HIGH (ref 65–99)
Potassium: 4.1 mmol/L (ref 3.5–5.1)
SODIUM: 138 mmol/L (ref 135–145)

## 2016-09-24 LAB — GLUCOSE, CAPILLARY
GLUCOSE-CAPILLARY: 227 mg/dL — AB (ref 65–99)
Glucose-Capillary: 153 mg/dL — ABNORMAL HIGH (ref 65–99)
Glucose-Capillary: 264 mg/dL — ABNORMAL HIGH (ref 65–99)
Glucose-Capillary: 238 mg/dL — ABNORMAL HIGH (ref 65–99)

## 2016-09-24 LAB — MRSA PCR SCREENING: MRSA BY PCR: NEGATIVE

## 2016-09-24 LAB — EXPECTORATED SPUTUM ASSESSMENT W REFEX TO RESP CULTURE

## 2016-09-24 MED ORDER — METRONIDAZOLE IN NACL 5-0.79 MG/ML-% IV SOLN
500.0000 mg | Freq: Three times a day (TID) | INTRAVENOUS | Status: DC
  Administered 2016-09-24 – 2016-09-25 (×3): 500 mg via INTRAVENOUS
  Filled 2016-09-24 (×3): qty 100

## 2016-09-24 NOTE — Progress Notes (Signed)
Patient ID: Martin [name], male   DOB: Oct 20, 1945, 71 y.o.   MRN: [id number]    PROGRESS NOTE  Martin [name] [facility]  KDT:[id number] DOB: December 22, 1945 DOA: 09/18/2016  PCP: [place] Olp, MD   Brief Narrative:  Pt is 71 yo male with known insulin dependent DM type II, HTN, presented to [facility] ED with main concern of 1-2 days duration of progressively worsening abd pain, watery diarrhea, nausea and poor oral intake.  Major events since admission: 5/26 - more cough, CT chest with multifocal PNA 5/27 - less diarrhea, still coughing, WBC still up  Assessment & Plan:   Principal Problem:   Sepsis secondary to C. Diff  - C. Diff + - continue oral vanc day #6/14 - Diarrhea resolved.      Multifocal PNA - unknown pathogen - continue Rocephin and Zithromax day #3 - f/u on sputum and blood cultures, urine legionella and strep pneumo  -WBC up, cough not better. Will repeat chest x ray. Will add IV flagyl.  Send sputum culture. If no improvement might need to cover for pseudomonas.     Acute kidney injury - pre renal in etiology from significant volume loss in the setting of diarrhea - stable.      Urinary retention - resolved     DM (diabetes mellitus) type II uncontrolled with eye manifestation, neuropathy  - continue Lantus and SSI    Essential hypertension - SBP in 160's, will add hydralazine as needed    Morbid obesity ([place]) - Body mass index is 42.72 kg/m.    Chronic diastolic CHF - last EF stable in 99/8338, grade I diastolic CHF - no signs of volume overload at this time   DVT prophylaxis: Heparin SQ Code Status: Full  Family Communication: patient.  Disposition Plan: not ready for d/c yet   Consultants:   GI team over the phone  PCCM over the phone   Procedures:   None  Antimicrobials:   Oral vancomycin 5/24 -->   Zithromax 5/26 -->  Rocephin 5/26 -->  Subjective: Having trouble with cough, persist , worse at night.  Diarrhea resolved.    Objective: Vitals:   09/23/16 0512 09/23/16 1345 09/23/16 2033 09/24/16 0523  BP: (!) 155/83 (!) 158/69 (!) 161/68 (!) 173/78  Pulse: 78 76 75 72  Resp: 20 (!) 24 20 20   Temp: 97.5 F (36.4 C) 98.4 F (36.9 C) 98.2 F (36.8 C) 98.4 F (36.9 C)  TempSrc: Oral Oral Oral Oral  SpO2: 94% 94% 93% 93%  Weight:      Height:        Intake/Output Summary (Last 24 hours) at 09/24/16 1226 Last data filed at 09/24/16 0755  Gross per 24 hour  Intake              360 ml  Output              400 ml  Net              -40 ml   Filed Weights   09/19/16 1400  Weight: (!) 142.9 kg (315 lb)    Examination: Physical Exam  Constitutional: NAD CVS: RRR, S 1, S 2 RRR Pulmonary: Bilateral ronchus  Abdominal: soft, nt, nd Musculoskeletal: Normal range of motion.  Neuro: non focal.   Data Reviewed: I have personally reviewed following labs and imaging studies  CBC:  Recent Labs Lab 09/18/16 1040  09/20/16 0142 09/21/16 0636 09/22/16 0629 09/23/16 0543 09/24/16 0615  WBC 15.0*  < >  12.8* 13.5* 14.5* 14.9* 15.8*  NEUTROABS 11.9*  --   --   --   --   --   --   HGB 15.1  < > 12.0* 11.6* 11.8* 11.8* 12.0*  HCT 45.3  < > 36.0* 34.8* 34.6* 36.0* 36.3*  MCV 91.5  < > 90.5 89.5 88.7 90.2 89.2  PLT 288  < > 194 184 193 187 178  < > = values in this interval not displayed. Basic Metabolic Panel:  Recent Labs Lab 09/20/16 0142 09/21/16 0636 09/22/16 0629 09/23/16 0543 09/24/16 0607  NA 136 136 136 138 138  K 4.8 4.0 4.3 3.9 4.1  CL 105 107 104 103 103  CO2 24 22 23 25 27   GLUCOSE 170* 138* 168* 182* 184*  BUN 27* 16 13 12 12   CREATININE 1.55* 1.28* 1.27* 1.37* 1.23  CALCIUM 8.1* 8.2* 8.5* 8.6* 8.9   Liver Function Tests:  Recent Labs Lab 09/18/16 1040  AST 22  ALT 22  ALKPHOS 52  BILITOT 1.2  PROT 7.0  ALBUMIN 3.7    Recent Labs Lab 09/18/16 1040  LIPASE <10*   Cardiac Enzymes:  Recent Labs Lab 09/19/16 1410 09/19/16 1959 09/20/16 0142  TROPONINI <0.03  <0.03 <0.03   CBG:  Recent Labs Lab 09/23/16 0739 09/23/16 1131 09/23/16 1701 09/24/16 0726 09/24/16 1135  GLUCAP 174* 240* 235* 153* 227*   Urine analysis:    Component Value Date/Time   COLORURINE YELLOW 09/19/2016 [place] 09/19/2016 1129   LABSPEC 1.014 09/19/2016 1129   PHURINE 5.0 09/19/2016 1129   GLUCOSEU NEGATIVE 09/19/2016 1129   GLUCOSEU > or = 1000 mg/dL (AA) 07/11/2008 0734   HGBUR LARGE (A) 09/19/2016 1129   BILIRUBINUR NEGATIVE 09/19/2016 1129   BILIRUBINUR n 08/08/2015 1010   KETONESUR NEGATIVE 09/19/2016 1129   PROTEINUR NEGATIVE 09/19/2016 1129   UROBILINOGEN 0.2 08/08/2015 1010   UROBILINOGEN 0.2 mg/dL 07/11/2008 0734   NITRITE NEGATIVE 09/19/2016 1129   LEUKOCYTESUR NEGATIVE 09/19/2016 1129   Recent Results (from the past 240 hour(s))  Gastrointestinal Panel by PCR , Stool     Status: None   Collection Time: 09/19/16  4:47 AM  Result Value Ref Range Status   Campylobacter species NOT DETECTED NOT DETECTED Final   Plesimonas shigelloides NOT DETECTED NOT DETECTED Final   Salmonella species NOT DETECTED NOT DETECTED Final   Yersinia enterocolitica NOT DETECTED NOT DETECTED Final   Vibrio species NOT DETECTED NOT DETECTED Final   Vibrio cholerae NOT DETECTED NOT DETECTED Final   Enteroaggregative E coli (EAEC) NOT DETECTED NOT DETECTED Final   Enteropathogenic E coli (EPEC) NOT DETECTED NOT DETECTED Final   Enterotoxigenic E coli (ETEC) NOT DETECTED NOT DETECTED Final   Shiga like toxin producing E coli (STEC) NOT DETECTED NOT DETECTED Final   Shigella/Enteroinvasive E coli (EIEC) NOT DETECTED NOT DETECTED Final   Cryptosporidium NOT DETECTED NOT DETECTED Final   Cyclospora cayetanensis NOT DETECTED NOT DETECTED Final   Entamoeba histolytica NOT DETECTED NOT DETECTED Final   Giardia lamblia NOT DETECTED NOT DETECTED Final   Adenovirus F40/41 NOT DETECTED NOT DETECTED Final   Astrovirus NOT DETECTED NOT DETECTED Final   Norovirus  GI/GII NOT DETECTED NOT DETECTED Final   Rotavirus A NOT DETECTED NOT DETECTED Final   Sapovirus (I, II, IV, and V) NOT DETECTED NOT DETECTED Final  C difficile quick scan w PCR reflex     Status: Abnormal   Collection Time: 09/19/16  4:47 AM  Result  Value Ref Range Status   C Diff antigen POSITIVE (A) NEGATIVE Final   C Diff toxin NEGATIVE NEGATIVE Final   C Diff interpretation Results are indeterminate. See PCR results.  Final  Clostridium Difficile by PCR     Status: Abnormal   Collection Time: 09/19/16  4:47 AM  Result Value Ref Range Status   Toxigenic C Difficile by pcr POSITIVE (A) NEGATIVE Final    Comment: Positive for toxigenic C. difficile with little to no toxin production. Only treat if clinical presentation suggests symptomatic illness. Performed at [facility], [place] [address], [place], [place] [address]   Culture, Urine     Status: None   Collection Time: 09/19/16 11:29 AM  Result Value Ref Range Status   Specimen Description URINE, RANDOM  Final   Special Requests NONE  Final   Culture   Final    NO GROWTH Performed at [facility], [place] [address], [place], [place] [address]    Report Status 09/20/2016 FINAL  Final  Culture, blood (routine x 2) Call MD if unable to obtain prior to antibiotics being given     Status: None (Preliminary result)   Collection Time: 09/21/16 11:59 AM  Result Value Ref Range Status   Specimen Description BLOOD RIGHT ARM  Final   Special Requests   Final    BOTTLES DRAWN AEROBIC AND ANAEROBIC Blood Culture adequate volume   Culture   Final    NO GROWTH 2 DAYS Performed at [facility], [place] [address] [place], [place] [address]    Report Status PENDING  Incomplete  Culture, blood (routine x 2) Call MD if unable to obtain prior to antibiotics being given     Status: None (Preliminary result)   Collection Time: 09/21/16 12:08 PM  Result Value Ref Range Status   Specimen Description BLOOD RIGHT HAND  Final   Special  Requests IN PEDIATRIC BOTTLE Blood Culture adequate volume  Final   Culture   Final    NO GROWTH 2 DAYS Performed at [facility], [place] [address] [place], [place] [address]    Report Status PENDING  Incomplete    Radiology Studies: Dg Chest 2 View  Result Date: 09/24/2016 CLINICAL DATA:  Increasing cough over the past 2 days. Patient also has watery diarrhea associated with nausea. History of diabetes, hypertension, morbid obesity EXAM: CHEST  2 VIEW COMPARISON:  Chest x-ray of Sep 18, 2016 and chest CT scan of Sep 21, 2016 FINDINGS: The lungs are adequately inflated. There is a small right pleural effusion. The interstitial markings are coarse. There remains increased density partially obscuring the right hemidiaphragm consistent with known infiltrate. The heart is top-normal in size. The pulmonary vascularity is not clearly engorged. There is calcification in the wall of the aortic arch. The bony thorax exhibits no acute abnormality. IMPRESSION: Mild interstitial prominence bilaterally not greatly changed from the previous study. Persistent increased density at the right lung base compatible with known pneumonia. Small right pleural effusion. Electronically Signed   By: David  [place] M.D.   On: 09/24/2016 12:21   Scheduled Meds: . azithromycin  500 mg Oral Daily  . Besifloxacin HCl  1 drop Left Eye TID  . Difluprednate  1 drop Left Eye TID  . heparin  5,000 Units Subcutaneous Q8H  . insulin aspart  0-9 Units Subcutaneous TID WC  . insulin glargine  50 Units Subcutaneous QHS  . vancomycin  125 mg Oral QID  Continuous Infusions: . cefTRIAXone (ROCEPHIN)  IV Stopped (09/23/16 1409)  . metronidazole      LOS: 6 days   Time spent: 35 minutes   Elmarie Shiley, MD [facility] Pager [phone]  If 7PM-7AM, please contact night-coverage [email or web address] Password [facility] 09/24/2016, 12:26 PM

## 2016-09-24 NOTE — Progress Notes (Signed)
Inpatient Diabetes Program Recommendations  AACE/ADA: New Consensus Statement on Inpatient Glycemic Control (2015)  Target Ranges:  Prepandial:   less than 140 mg/dL      Peak postprandial:   less than 180 mg/dL (1-2 hours)      Critically ill patients:  140 - 180 mg/dL   Lab Results  Component Value Date   GLUCAP 227 (H) 09/24/2016   HGBA1C 8.0 06/18/2016    Review of Glycemic Control  Post-prandial blood sugars elevated. Needs meal coverage insulin.  Inpatient Diabetes Program Recommendations:   Add Novolog 4 units tidwc for meal coverage insulin.  Will continue to follow.  Thank you. Lorenda Peck, RD, LDN, CDE Inpatient Diabetes Coordinator [phone]

## 2016-09-25 DIAGNOSIS — J690 Pneumonitis due to inhalation of food and vomit: Secondary | ICD-10-CM

## 2016-09-25 DIAGNOSIS — R112 Nausea with vomiting, unspecified: Secondary | ICD-10-CM

## 2016-09-25 DIAGNOSIS — R197 Diarrhea, unspecified: Secondary | ICD-10-CM

## 2016-09-25 DIAGNOSIS — A0472 Enterocolitis due to Clostridium difficile, not specified as recurrent: Secondary | ICD-10-CM

## 2016-09-25 DIAGNOSIS — Z8701 Personal history of pneumonia (recurrent): Secondary | ICD-10-CM

## 2016-09-25 LAB — CBC
HCT: 36 % — ABNORMAL LOW (ref 39.0–52.0)
Hemoglobin: 12 g/dL — ABNORMAL LOW (ref 13.0–17.0)
MCH: 29.5 pg (ref 26.0–34.0)
MCHC: 33.3 g/dL (ref 30.0–36.0)
MCV: 88.5 fL (ref 78.0–100.0)
PLATELETS: 190 10*3/uL (ref 150–400)
RBC: 4.07 MIL/uL — ABNORMAL LOW (ref 4.22–5.81)
RDW: 14.1 % (ref 11.5–15.5)
WBC: 14.8 10*3/uL — ABNORMAL HIGH (ref 4.0–10.5)

## 2016-09-25 LAB — GLUCOSE, CAPILLARY
GLUCOSE-CAPILLARY: 186 mg/dL — AB (ref 65–99)
GLUCOSE-CAPILLARY: 226 mg/dL — AB (ref 65–99)
Glucose-Capillary: 196 mg/dL — ABNORMAL HIGH (ref 65–99)
Glucose-Capillary: 181 mg/dL — ABNORMAL HIGH (ref 65–99)

## 2016-09-25 MED ORDER — SODIUM CHLORIDE 0.9 % IV SOLN
3.0000 g | Freq: Four times a day (QID) | INTRAVENOUS | Status: DC
  Administered 2016-09-25 – 2016-09-26 (×5): 3 g via INTRAVENOUS
  Filled 2016-09-25 (×5): qty 3

## 2016-09-25 MED ORDER — HYDROCOD POLST-CPM POLST ER 10-8 MG/5ML PO SUER
5.0000 mL | Freq: Two times a day (BID) | ORAL | Status: DC
  Administered 2016-09-25 – 2016-09-26 (×3): 5 mL via ORAL
  Filled 2016-09-25 (×3): qty 5

## 2016-09-25 NOTE — Progress Notes (Signed)
PROGRESS NOTE    Martin [name]   WLN:[id number]  DOB: 1946/03/15  DOA: 09/18/2016 PCP: Marin Olp, MD   Brief Narrative:  Pt is 71 yo male with known insulin dependent DM type II, HTN presented to the ER for vomiting, abdominal pain and diarrhea. Found to have c diff colitis and treated with Vancomycin with improvement. He developed increasing cough during the hospital stay and a CT scan revealed multifocal pneumonia. He was started on IV antibiotics.   Subjective: Cough is less productive but still significant. Diarrhea has nearly resolved. No abdominal pain or nausea.   Assessment & Plan:  Principal Problem:   Sepsis secondary to C. Diff colitis - continue oral vancomycin - symptoms improved    Multifocal PNA - most prominent infiltrate is RLL -  Likely aspiration due to vomiting earlier on in his illness - transition to Unasyn today- last day of Zithromax- d/c home with Augmentin - add Tussionex     Acute kidney injury - pre renal in etiology from significant volume loss in the setting of diarrhea - stable.     Urinary retention - resolved     DM (diabetes mellitus) type II uncontrolled with eye manifestation, neuropathy  - continue Lantus and SSI    Essential hypertension - PRN hydralazine      Morbid obesity ([place]) - Body mass index is 42.72 kg/m.    Chronic diastolic CHF - last EF stable in 74/0814, grade I diastolic CHF - no signs of volume overload at this time  DVT prophylaxis: Heparin Code Status: Full code Family Communication:  Disposition Plan: home when stable Consultants:    Procedures:    Antimicrobials:  Anti-infectives    Start     Dose/Rate Route Frequency Ordered Stop   09/25/16 1200  Ampicillin-Sulbactam (UNASYN) 3 g in sodium chloride 0.9 % 100 mL IVPB     3 g 200 mL/hr over 30 Minutes Intravenous Every 6 hours 09/25/16 1110     09/24/16 1200  metroNIDAZOLE (FLAGYL) IVPB 500 mg  Status:  Discontinued     500  mg 100 mL/hr over 60 Minutes Intravenous Every 8 hours 09/24/16 1056 09/25/16 1110   09/23/16 1100  azithromycin (ZITHROMAX) tablet 500 mg  Status:  Discontinued     500 mg Oral Daily 09/23/16 1007 09/25/16 1111   09/21/16 1300  azithromycin (ZITHROMAX) 500 mg in dextrose 5 % 250 mL IVPB  Status:  Discontinued     500 mg 250 mL/hr over 60 Minutes Intravenous Every 24 hours 09/21/16 1137 09/23/16 1007   09/21/16 1200  cefTRIAXone (ROCEPHIN) 1 g in dextrose 5 % 50 mL IVPB  Status:  Discontinued     1 g 100 mL/hr over 30 Minutes Intravenous Every 24 hours 09/21/16 1137 09/25/16 1110   09/19/16 1800  vancomycin (VANCOCIN) 50 mg/mL oral solution 125 mg     125 mg Oral 4 times daily 09/19/16 1656 10/03/16 1759       Objective: Vitals:   09/24/16 2022 09/25/16 0610 09/25/16 1450 09/25/16 1514  BP: (!) 176/80 (!) 184/80 (!) 193/77 (!) 174/84  Pulse: 75 80 76   Resp: 20 20 20    Temp: 97.7 F (36.5 C) 97.5 F (36.4 C) 98.3 F (36.8 C)   TempSrc: Oral Oral Oral   SpO2: 96% 94% 98%   Weight:      Height:        Intake/Output Summary (Last 24 hours) at 09/25/16 1646 Last data filed at 09/25/16 1309  Gross per 24 hour  Intake             1280 ml  Output                1 ml  Net             1279 ml   Filed Weights   09/19/16 1400 09/24/16 1525  Weight: (!) 142.9 kg (315 lb) (!) 137.6 kg (303 lb 5.7 oz)    Examination: General exam: Appears comfortable  HEENT: PERRLA, oral mucosa moist, no sclera icterus or thrush Respiratory system: Clear to auscultation. Respiratory effort normal. Cardiovascular system: S1 & S2 heard, RRR.  No murmurs  Gastrointestinal system: Abdomen soft, non-tender, nondistended. Normal bowel sound. No organomegaly Central nervous system: Alert and oriented. No focal neurological deficits. Extremities: No cyanosis, clubbing or edema Skin: No rashes or ulcers Psychiatry:  Mood & affect appropriate.     Data Reviewed: I have personally reviewed following  labs and imaging studies  CBC:  Recent Labs Lab 09/21/16 0636 09/22/16 0629 09/23/16 0543 09/24/16 0615 09/25/16 0627  WBC 13.5* 14.5* 14.9* 15.8* 14.8*  HGB 11.6* 11.8* 11.8* 12.0* 12.0*  HCT 34.8* 34.6* 36.0* 36.3* 36.0*  MCV 89.5 88.7 90.2 89.2 88.5  PLT 184 193 187 178 599   Basic Metabolic Panel:  Recent Labs Lab 09/20/16 0142 09/21/16 0636 09/22/16 0629 09/23/16 0543 09/24/16 0607  NA 136 136 136 138 138  K 4.8 4.0 4.3 3.9 4.1  CL 105 107 104 103 103  CO2 24 22 23 25 27   GLUCOSE 170* 138* 168* 182* 184*  BUN 27* 16 13 12 12   CREATININE 1.55* 1.28* 1.27* 1.37* 1.23  CALCIUM 8.1* 8.2* 8.5* 8.6* 8.9   GFR: Estimated Creatinine Clearance: 78 mL/min (by C-G formula based on SCr of 1.23 mg/dL). Liver Function Tests: No results for input(s): AST, ALT, ALKPHOS, BILITOT, PROT, ALBUMIN in the last 168 hours. No results for input(s): LIPASE, AMYLASE in the last 168 hours. No results for input(s): AMMONIA in the last 168 hours. Coagulation Profile: No results for input(s): INR, PROTIME in the last 168 hours. Cardiac Enzymes:  Recent Labs Lab 09/19/16 1410 09/19/16 1959 09/20/16 0142  TROPONINI <0.03 <0.03 <0.03   BNP (last 3 results) No results for input(s): PROBNP in the last 8760 hours. HbA1C: No results for input(s): HGBA1C in the last 72 hours. CBG:  Recent Labs Lab 09/24/16 1135 09/24/16 1620 09/24/16 2025 09/25/16 0728 09/25/16 1156  GLUCAP 227* 264* 238* 186* 226*   Lipid Profile: No results for input(s): CHOL, HDL, LDLCALC, TRIG, CHOLHDL, LDLDIRECT in the last 72 hours. Thyroid Function Tests: No results for input(s): TSH, T4TOTAL, FREET4, T3FREE, THYROIDAB in the last 72 hours. Anemia Panel: No results for input(s): VITAMINB12, FOLATE, FERRITIN, TIBC, IRON, RETICCTPCT in the last 72 hours. Urine analysis:    Component Value Date/Time   COLORURINE YELLOW 09/19/2016 1129   APPEARANCEUR CLEAR 09/19/2016 1129   LABSPEC 1.014 09/19/2016 1129    PHURINE 5.0 09/19/2016 1129   GLUCOSEU NEGATIVE 09/19/2016 1129   GLUCOSEU > or = 1000 mg/dL (AA) 07/11/2008 0734   HGBUR LARGE (A) 09/19/2016 1129   BILIRUBINUR NEGATIVE 09/19/2016 1129   BILIRUBINUR n 08/08/2015 1010   KETONESUR NEGATIVE 09/19/2016 1129   PROTEINUR NEGATIVE 09/19/2016 1129   UROBILINOGEN 0.2 08/08/2015 1010   UROBILINOGEN 0.2 mg/dL 07/11/2008 0734   NITRITE NEGATIVE 09/19/2016 1129   LEUKOCYTESUR NEGATIVE 09/19/2016 1129   Sepsis Labs: @LABRCNTIP (procalcitonin:4,lacticidven:4) ) Recent Results (from  the past 240 hour(s))  Gastrointestinal Panel by PCR , Stool     Status: None   Collection Time: 09/19/16  4:47 AM  Result Value Ref Range Status   Campylobacter species NOT DETECTED NOT DETECTED Final   Plesimonas shigelloides NOT DETECTED NOT DETECTED Final   Salmonella species NOT DETECTED NOT DETECTED Final   Yersinia enterocolitica NOT DETECTED NOT DETECTED Final   Vibrio species NOT DETECTED NOT DETECTED Final   Vibrio cholerae NOT DETECTED NOT DETECTED Final   Enteroaggregative E coli (EAEC) NOT DETECTED NOT DETECTED Final   Enteropathogenic E coli (EPEC) NOT DETECTED NOT DETECTED Final   Enterotoxigenic E coli (ETEC) NOT DETECTED NOT DETECTED Final   Shiga like toxin producing E coli (STEC) NOT DETECTED NOT DETECTED Final   Shigella/Enteroinvasive E coli (EIEC) NOT DETECTED NOT DETECTED Final   Cryptosporidium NOT DETECTED NOT DETECTED Final   Cyclospora cayetanensis NOT DETECTED NOT DETECTED Final   Entamoeba histolytica NOT DETECTED NOT DETECTED Final   Giardia lamblia NOT DETECTED NOT DETECTED Final   Adenovirus F40/41 NOT DETECTED NOT DETECTED Final   Astrovirus NOT DETECTED NOT DETECTED Final   Norovirus GI/GII NOT DETECTED NOT DETECTED Final   Rotavirus A NOT DETECTED NOT DETECTED Final   Sapovirus (I, II, IV, and V) NOT DETECTED NOT DETECTED Final  C difficile quick scan w PCR reflex     Status: Abnormal   Collection Time: 09/19/16  4:47 AM   Result Value Ref Range Status   C Diff antigen POSITIVE (A) NEGATIVE Final   C Diff toxin NEGATIVE NEGATIVE Final   C Diff interpretation Results are indeterminate. See PCR results.  Final  Clostridium Difficile by PCR     Status: Abnormal   Collection Time: 09/19/16  4:47 AM  Result Value Ref Range Status   Toxigenic C Difficile by pcr POSITIVE (A) NEGATIVE Final    Comment: Positive for toxigenic C. difficile with little to no toxin production. Only treat if clinical presentation suggests symptomatic illness. Performed at [facility], [place] [address] [place], [place] [address]   Culture, Urine     Status: None   Collection Time: 09/19/16 11:29 AM  Result Value Ref Range Status   Specimen Description URINE, RANDOM  Final   Special Requests NONE  Final   Culture   Final    NO GROWTH Performed at [facility], [place] [address], [place], [place] [address]    Report Status 09/20/2016 FINAL  Final  Culture, blood (routine x 2) Call MD if unable to obtain prior to antibiotics being given     Status: None (Preliminary result)   Collection Time: 09/21/16 11:59 AM  Result Value Ref Range Status   Specimen Description BLOOD RIGHT ARM  Final   Special Requests   Final    BOTTLES DRAWN AEROBIC AND ANAEROBIC Blood Culture adequate volume   Culture   Final    NO GROWTH 4 DAYS Performed at [facility], [place] [address], [place], [place] [address]    Report Status PENDING  Incomplete  Culture, blood (routine x 2) Call MD if unable to obtain prior to antibiotics being given     Status: None (Preliminary result)   Collection Time: 09/21/16 12:08 PM  Result Value Ref Range Status   Specimen Description BLOOD RIGHT HAND  Final   Special Requests IN PEDIATRIC BOTTLE Blood Culture adequate volume  Final   Culture   Final    NO GROWTH 4  DAYS Performed at [facility], [place] [address] [place], [place] [address]    Report Status PENDING  Incomplete  Culture,  sputum-assessment     Status: None   Collection Time: 09/21/16  1:24 PM  Result Value Ref Range Status   Specimen Description SPU  Final   Special Requests NONE  Final   Sputum evaluation   Final    Sputum specimen not acceptable for testing.  Please recollect.   CALLED A SCOTT,RN @1149  09/24/16 MKELLY   Report Status 09/24/2016 FINAL  Final  MRSA PCR Screening     Status: None   Collection Time: 09/24/16 10:56 AM  Result Value Ref Range Status   MRSA by PCR NEGATIVE NEGATIVE Final    Comment:        The GeneXpert MRSA Assay (FDA approved for NASAL specimens only), is one component of a comprehensive MRSA colonization surveillance program. It is not intended to diagnose MRSA infection nor to guide or monitor treatment for MRSA infections.          Radiology Studies: Dg Chest 2 View  Result Date: 09/24/2016 CLINICAL DATA:  Increasing cough over the past 2 days. Patient also has watery diarrhea associated with nausea. History of diabetes, hypertension, morbid obesity EXAM: CHEST  2 VIEW COMPARISON:  Chest x-ray of Sep 18, 2016 and chest CT scan of Sep 21, 2016 FINDINGS: The lungs are adequately inflated. There is a small right pleural effusion. The interstitial markings are coarse. There remains increased density partially obscuring the right hemidiaphragm consistent with known infiltrate. The heart is top-normal in size. The pulmonary vascularity is not clearly engorged. There is calcification in the wall of the aortic arch. The bony thorax exhibits no acute abnormality. IMPRESSION: Mild interstitial prominence bilaterally not greatly changed from the previous study. Persistent increased density at the right lung base compatible with known pneumonia. Small right pleural effusion. Electronically Signed   By: David  Martinique M.D.   On: 09/24/2016 12:21      Scheduled Meds: . Besifloxacin HCl  1 drop Left Eye TID  . chlorpheniramine-HYDROcodone  5 mL Oral Q12H  . Difluprednate  1  drop Left Eye TID  . heparin  5,000 Units Subcutaneous Q8H  . insulin aspart  0-9 Units Subcutaneous TID WC  . insulin glargine  50 Units Subcutaneous QHS  . vancomycin  125 mg Oral QID   Continuous Infusions: . ampicillin-sulbactam (UNASYN) IV Stopped (09/25/16 1308)     LOS: 7 days    Time spent in minutes: 35    Debbe Odea, MD [facility] Pager: [email or web address] Password [facility] 09/25/2016, 4:46 PM

## 2016-09-26 DIAGNOSIS — A419 Sepsis, unspecified organism: Secondary | ICD-10-CM

## 2016-09-26 LAB — CULTURE, BLOOD (ROUTINE X 2)
CULTURE: NO GROWTH
CULTURE: NO GROWTH
SPECIAL REQUESTS: ADEQUATE
SPECIAL REQUESTS: ADEQUATE

## 2016-09-26 LAB — GLUCOSE, CAPILLARY
Glucose-Capillary: 168 mg/dL — ABNORMAL HIGH (ref 65–99)
Glucose-Capillary: 221 mg/dL — ABNORMAL HIGH (ref 65–99)

## 2016-09-26 LAB — CBC
HCT: 35.1 % — ABNORMAL LOW (ref 39.0–52.0)
HEMOGLOBIN: 11.7 g/dL — AB (ref 13.0–17.0)
MCH: 29.5 pg (ref 26.0–34.0)
MCHC: 33.3 g/dL (ref 30.0–36.0)
MCV: 88.4 fL (ref 78.0–100.0)
PLATELETS: 181 10*3/uL (ref 150–400)
RBC: 3.97 MIL/uL — AB (ref 4.22–5.81)
RDW: 14.2 % (ref 11.5–15.5)
WBC: 12.5 10*3/uL — AB (ref 4.0–10.5)

## 2016-09-26 MED ORDER — AMOXICILLIN-POT CLAVULANATE 875-125 MG PO TABS: 1.0000 | ORAL_TABLET | Freq: Two times a day (BID) | ORAL | 0 refills | Status: AC

## 2016-09-26 MED ORDER — VALSARTAN-HYDROCHLOROTHIAZIDE 320-25 MG PO TABS: 1.0000 | ORAL_TABLET | Freq: Every day | ORAL | Status: DC

## 2016-09-26 MED ORDER — HYDROCHLOROTHIAZIDE 25 MG PO TABS
25.0000 mg | ORAL_TABLET | Freq: Every day | ORAL | Status: DC
  Administered 2016-09-26: 25 mg via ORAL
  Filled 2016-09-26: qty 1

## 2016-09-26 MED ORDER — VANCOMYCIN HCL 250 MG PO CAPS: 250.0000 mg | ORAL_CAPSULE | Freq: Four times a day (QID) | ORAL | 0 refills | Status: DC

## 2016-09-26 MED ORDER — IRBESARTAN 150 MG PO TABS
300.0000 mg | ORAL_TABLET | Freq: Every day | ORAL | Status: DC
  Administered 2016-09-26: 300 mg via ORAL
  Filled 2016-09-26: qty 2

## 2016-09-26 MED ORDER — HYDROCOD POLST-CPM POLST ER 10-8 MG/5ML PO SUER: 5.0000 mL | Freq: Two times a day (BID) | ORAL | 0 refills | Status: DC

## 2016-09-26 MED ORDER — VANCOMYCIN 50 MG/ML ORAL SOLUTION: 125.0000 mg | Freq: Four times a day (QID) | ORAL | 0 refills | Status: DC

## 2016-09-26 NOTE — Progress Notes (Signed)
Pts IV removed with a clean dressing intact. Pt denies pain at this time with no signs and symptoms of distress noted. Pt escorted by nursing staff and family.

## 2016-09-26 NOTE — Care Management Important Message (Signed)
Important Message  Patient Details IM Letter given to Rhonda/Case Manager to present to Patient  Name: Martin [name] MRN: [id number] Date of Birth: 09/05/1945   Medicare Important Message Given:  Yes    Kerin Salen 09/26/2016, 11:27 [place] Message  Patient Details  Name: Martin [name] MRN: [id number] Date of Birth: 01/07/1946   Medicare Important Message Given:  Yes    Kerin Salen 09/26/2016, 11:26 AM

## 2016-09-26 NOTE — Progress Notes (Signed)
Date: Sep 26, 2016  Discharge orders review for case management needs.  None found  Velva Harman, BSN, [place], [place]:  [phone]

## 2016-09-26 NOTE — Discharge Summary (Signed)
Physician Discharge Summary  Martin [name] [facility] IDP:[id number] DOB: [birth year] DOA: 09/18/2016  PCP: Martin Olp, MD  Admit date: 09/18/2016 Discharge date: 09/26/2016  Admitted From: home  Disposition:  home   Recommendations for Outpatient Follow-up:  1. CBC in 1 wk  Home Health:  none  Equipment/Devices:  none    Discharge Condition:  stable   CODE STATUS:  Full code   Consultations:  none    Discharge Diagnoses:  Principal Problem:   C. difficile colitis Active Problems:   AKI (acute kidney injury) (Martin [name])   Aspiration pneumonia (Martin [name])   DM (diabetes mellitus) type II uncontrolled with eye manifestation (Martin [name])   Diabetic polyneuropathy (Martin [name])   Essential hypertension   Morbid obesity (Martin [name])    Subjective: Cough is less frequent with the initiation of Tussionex last night. Non-productive. Solid stool yesterday without abdominal pain.   Brief Summary: Pt is 71 yo male with known insulin dependent DM type II, HTN presented to the ER for vomiting, abdominal pain and diarrhea. Found to have c diff colitis and treated with Vancomycin with improvement. He developed increasing cough during the hospital stay and a CT scan revealed multifocal pneumonia. He was started on IV antibiotics.   Hospital Course:  Principal Problem:  C. Diff colitis - symptoms have resolved with treatment- having solid BMs and tolerating solid food - no abdominal pain - continue oral vancomycin for 1 wk after completing treatment for pneumonia- giving prescriptions for both liquid and tab from which can be filled based on what is available at his pharmacy.  Multifocal PNA - most prominent infiltrate is RLL -  Likely aspiration due to vomiting earlier on in his illness -  d/c home with Augmentin - added Tussionex for ongoing dry cough  Sepsis- due to above - resolved with treatment- WBC count has improved to 12 today  Acute kidney injury - pre renal in etiology from significant volume loss in  the setting of diarrhea - stable.   Urinary retention - resolved   DM (diabetes mellitus) type II uncontrolled with eye manifestation, neuropathy  - continue Lantus and SSI  Essential hypertension - PRN hydralazine used during hospital stay- ARB/HCTZ combination held due AKI which has resolved - home meds resumed  Morbid obesity (Martin [name]) - Body mass index is 42.72 kg/m.  Chronic diastolic CHF - last EF stable in 44/3154, grade I diastolic CHF - no signs of volume overload at this time   Discharge Instructions  Discharge Instructions    Diet - low sodium heart healthy    Complete by:  As directed    Diet Carb Modified    Complete by:  As directed    Increase activity slowly    Complete by:  As directed      Allergies as of 09/26/2016      Reactions   Simvastatin Diarrhea      Medication List    TAKE these medications   ACCU-CHEK COMPACT TEST DRUM test strip Generic drug:  glucose blood TEST AS DIRECTED   glucose blood test strip 100 each by Other route 3 (three) times daily. Use as instructed   amoxicillin-clavulanate 875-125 MG tablet Commonly known as:  AUGMENTIN Take 1 tablet by mouth every 12 (twelve) hours.   BESIVANCE 0.6 % Susp Generic drug:  Besifloxacin HCl Place 1 drop into the left eye 3 (three) times daily.   chlorpheniramine-HYDROcodone 10-8 MG/5ML Suer Commonly known as:  TUSSIONEX Take 5 mLs by mouth every 12 (twelve) hours.  DUREZOL 0.05 % Emul Generic drug:  Difluprednate Place 1 drop into the left eye 3 (three) times daily.   Insulin Glargine 100 UNIT/ML Solostar Pen Commonly known as:  LANTUS SOLOSTAR Inject 320 Units into the skin every morning.   NOVOFINE 32G X 6 MM Misc Generic drug:  Insulin Pen Needle USE 2 TIMES DAILY   valsartan-hydrochlorothiazide 320-25 MG tablet Commonly known as:  DIOVAN-HCT TAKE 1 TABLET BY MOUTH DAILY.   vancomycin 50 mg/mL oral solution Commonly known as:  VANCOCIN Take 2.5 mLs  (125 mg total) by mouth 4 (four) times daily.   vancomycin 250 MG capsule Commonly known as:  VANCOCIN Take 1 capsule (250 mg total) by mouth 4 (four) times daily.       Allergies  Allergen Reactions  . Simvastatin Diarrhea     Procedures/Studies:    Dg Chest 2 View  Result Date: 09/24/2016 CLINICAL DATA:  Increasing cough over the past 2 days. Patient also has watery diarrhea associated with nausea. History of diabetes, hypertension, morbid obesity EXAM: CHEST  2 VIEW COMPARISON:  Chest x-ray of Sep 18, 2016 and chest CT scan of Sep 21, 2016 FINDINGS: The lungs are adequately inflated. There is a small right pleural effusion. The interstitial markings are coarse. There remains increased density partially obscuring the right hemidiaphragm consistent with known infiltrate. The heart is top-normal in size. The pulmonary vascularity is not clearly engorged. There is calcification in the wall of the aortic arch. The bony thorax exhibits no acute abnormality. IMPRESSION: Mild interstitial prominence bilaterally not greatly changed from the previous study. Persistent increased density at the right lung base compatible with known pneumonia. Small right pleural effusion. Electronically Signed   By: David  Martinique M.D.   On: 09/24/2016 12:21   Dg Chest 2 View  Result Date: 09/18/2016 CLINICAL DATA:  Nausea, vomiting and diarrhea for 3 days. Shortness of breath. EXAM: CHEST  2 VIEW COMPARISON:  None. FINDINGS: Streaky bibasilar airspace disease is more notable on the right. Lung volumes are low. No pneumothorax or pleural effusion. Heart size is upper normal. No acute bony abnormality. IMPRESSION: Streaky basilar airspace opacities are likely due to atelectasis in this low volume chest. Electronically Signed   By: Inge Rise M.D.   On: 09/18/2016 11:22   Ct Chest Wo Contrast  Result Date: 09/21/2016 CLINICAL DATA:  72 year old male with a history of cough EXAM: CT CHEST WITHOUT CONTRAST  TECHNIQUE: Multidetector CT imaging of the chest was performed following the standard protocol without IV contrast. COMPARISON:  Chest x-ray 09/18/2016 FINDINGS: Cardiovascular: Heart size within normal limits. No significant pericardial fluid/ thickening. Calcifications of left main, right coronary arteries. Unremarkable course caliber and contour of the thoracic aorta. Minimal calcifications. Unremarkable size of the main pulmonary artery. Mediastinum/Nodes: There are several mediastinal lymph nodes, none of which are enlarged by CT size criteria. Predominant nodes within the right sided peribronchial and hilar nodes. Lungs/Pleura: Airspace disease of the right lower lobe in a peribronchovascular distribution. Small pleural effusion. Minimal airspace opacity of the right middle lobe with mixed ground-glass and nodular opacity. Bronchial wall thickening. Minimal ground-glass opacity at the left base. Upper Abdomen: Unremarkable appearance of the upper abdomen Musculoskeletal: No displaced fracture. No significant degenerative changes. IMPRESSION: Evidence of multifocal pneumonia, predominantly of the right lower lobe with small parapneumonic effusion. Reactive lymph nodes of the hilar region and mediastinum. Followup PA and lateral chest X-ray is recommended in 3-4 weeks following therapy to assure resolution. Aortic atherosclerosis and coronary  artery disease. Electronically Signed   By: Corrie Mckusick D.O.   On: 09/21/2016 10:34       Discharge Exam: Vitals:   09/26/16 0044 09/26/16 0538  BP: (!) 173/78 (!) 165/71  Pulse: 79 78  Resp:  18  Temp:  97.9 F (36.6 C)   Vitals:   09/25/16 2052 09/26/16 0044 09/26/16 0538 09/26/16 0625  BP: (!) 175/78 (!) 173/78 (!) 165/71   Pulse: 76 79 78   Resp: 20  18   Temp: 98.2 F (36.8 C)  97.9 F (36.6 C)   TempSrc: Oral  Oral   SpO2: 95%  95%   Weight:    (!) 136.7 kg (301 lb 6.4 oz)  Height:        General: Pt is alert, awake, not in acute  distress Cardiovascular: RRR, S1/S2 +, no rubs, no gallops Respiratory: CTA bilaterally, no wheezing, no rhonchi Abdominal: Soft, NT, ND, bowel sounds + Extremities: no edema, no cyanosis    The results of significant diagnostics from this hospitalization (including imaging, microbiology, ancillary and laboratory) are listed below for reference.     Microbiology: Recent Results (from the past 240 hour(s))  Gastrointestinal Panel by PCR , Stool     Status: None   Collection Time: 09/19/16  4:47 AM  Result Value Ref Range Status   Campylobacter species NOT DETECTED NOT DETECTED Final   Plesimonas shigelloides NOT DETECTED NOT DETECTED Final   Salmonella species NOT DETECTED NOT DETECTED Final   Yersinia enterocolitica NOT DETECTED NOT DETECTED Final   Vibrio species NOT DETECTED NOT DETECTED Final   Vibrio cholerae NOT DETECTED NOT DETECTED Final   Enteroaggregative E coli (EAEC) NOT DETECTED NOT DETECTED Final   Enteropathogenic E coli (EPEC) NOT DETECTED NOT DETECTED Final   Enterotoxigenic E coli (ETEC) NOT DETECTED NOT DETECTED Final   Shiga like toxin producing E coli (STEC) NOT DETECTED NOT DETECTED Final   Shigella/Enteroinvasive E coli (EIEC) NOT DETECTED NOT DETECTED Final   Cryptosporidium NOT DETECTED NOT DETECTED Final   Cyclospora cayetanensis NOT DETECTED NOT DETECTED Final   Entamoeba histolytica NOT DETECTED NOT DETECTED Final   Giardia lamblia NOT DETECTED NOT DETECTED Final   Adenovirus F40/41 NOT DETECTED NOT DETECTED Final   Astrovirus NOT DETECTED NOT DETECTED Final   Norovirus GI/GII NOT DETECTED NOT DETECTED Final   Rotavirus A NOT DETECTED NOT DETECTED Final   Sapovirus (I, II, IV, and V) NOT DETECTED NOT DETECTED Final  C difficile quick scan w PCR reflex     Status: Abnormal   Collection Time: 09/19/16  4:47 AM  Result Value Ref Range Status   C Diff antigen POSITIVE (A) NEGATIVE Final   C Diff toxin NEGATIVE NEGATIVE Final   C Diff interpretation  Results are indeterminate. See PCR results.  Final  Clostridium Difficile by PCR     Status: Abnormal   Collection Time: 09/19/16  4:47 AM  Result Value Ref Range Status   Toxigenic C Difficile by pcr POSITIVE (A) NEGATIVE Final    Comment: Positive for toxigenic C. difficile with little to no toxin production. Only treat if clinical presentation suggests symptomatic illness. Performed at [facility], [place] [address] [place], [place] [address]   Culture, Urine     Status: None   Collection Time: 09/19/16 11:29 AM  Result Value Ref Range Status   Specimen Description URINE, RANDOM  Final   Special Requests NONE  Final   Culture   Final  NO GROWTH Performed at [facility], [place] [address], [place], [place] [address]    Report Status 09/20/2016 FINAL  Final  Culture, blood (routine x 2) Call MD if unable to obtain prior to antibiotics being given     Status: None (Preliminary result)   Collection Time: 09/21/16 11:59 AM  Result Value Ref Range Status   Specimen Description BLOOD RIGHT ARM  Final   Special Requests   Final    BOTTLES DRAWN AEROBIC AND ANAEROBIC Blood Culture adequate volume   Culture   Final    NO GROWTH 4 DAYS Performed at [facility], [place] [address] [place], [place] [address]    Report Status PENDING  Incomplete  Culture, blood (routine x 2) Call MD if unable to obtain prior to antibiotics being given     Status: None (Preliminary result)   Collection Time: 09/21/16 12:08 PM  Result Value Ref Range Status   Specimen Description BLOOD RIGHT HAND  Final   Special Requests IN PEDIATRIC BOTTLE Blood Culture adequate volume  Final   Culture   Final    NO GROWTH 4 DAYS Performed at [facility], [place] [address] [place], [place] [address]    Report Status PENDING  Incomplete  Culture, sputum-assessment     Status: None   Collection Time: 09/21/16  1:24 PM  Result Value Ref Range Status   Specimen Description SPU  Final   Special Requests  NONE  Final   Sputum evaluation   Final    Sputum specimen not acceptable for testing.  Please recollect.   CALLED A SCOTT,RN @1149  09/24/16 MKELLY   Report Status 09/24/2016 FINAL  Final  MRSA PCR Screening     Status: None   Collection Time: 09/24/16 10:56 AM  Result Value Ref Range Status   MRSA by PCR NEGATIVE NEGATIVE Final    Comment:        The GeneXpert MRSA Assay (FDA approved for NASAL specimens only), is one component of a comprehensive MRSA colonization surveillance program. It is not intended to diagnose MRSA infection nor to guide or monitor treatment for MRSA infections.      Labs: BNP (last 3 results) No results for input(s): BNP in the last 8760 hours. Basic Metabolic Panel:  Recent Labs Lab 09/20/16 0142 09/21/16 0636 09/22/16 0629 09/23/16 0543 09/24/16 0607  NA 136 136 136 138 138  K 4.8 4.0 4.3 3.9 4.1  CL 105 107 104 103 103  CO2 24 22 23 25 27   GLUCOSE 170* 138* 168* 182* 184*  BUN 27* 16 13 12 12   CREATININE 1.55* 1.28* 1.27* 1.37* 1.23  CALCIUM 8.1* 8.2* 8.5* 8.6* 8.9   Liver Function Tests: No results for input(s): AST, ALT, ALKPHOS, BILITOT, PROT, ALBUMIN in the last 168 hours. No results for input(s): LIPASE, AMYLASE in the last 168 hours. No results for input(s): AMMONIA in the last 168 hours. CBC:  Recent Labs Lab 09/22/16 0629 09/23/16 0543 09/24/16 0615 09/25/16 0627 09/26/16 0848  WBC 14.5* 14.9* 15.8* 14.8* 12.5*  HGB 11.8* 11.8* 12.0* 12.0* 11.7*  HCT 34.6* 36.0* 36.3* 36.0* 35.1*  MCV 88.7 90.2 89.2 88.5 88.4  PLT 193 187 178 190 181   Cardiac Enzymes:  Recent Labs Lab 09/19/16 1410 09/19/16 1959 09/20/16 0142  TROPONINI <0.03 <0.03 <0.03   BNP: Invalid input(s): POCBNP CBG:  Recent Labs Lab 09/25/16 0728 09/25/16 1156 09/25/16 1710 09/25/16 2148 09/26/16 0724  GLUCAP 186* 226* 196* 181*  168*   D-Dimer No results for input(s): DDIMER in the last 72 hours. Hgb A1c No results for input(s): HGBA1C  in the last 72 hours. Lipid Profile No results for input(s): CHOL, HDL, LDLCALC, TRIG, CHOLHDL, LDLDIRECT in the last 72 hours. Thyroid function studies No results for input(s): TSH, T4TOTAL, T3FREE, THYROIDAB in the last 72 hours.  Invalid input(s): FREET3 Anemia work up No results for input(s): VITAMINB12, FOLATE, FERRITIN, TIBC, IRON, RETICCTPCT in the last 72 hours. Urinalysis    Component Value Date/Time   COLORURINE YELLOW 09/19/2016 1129   APPEARANCEUR CLEAR 09/19/2016 1129   LABSPEC 1.014 09/19/2016 1129   PHURINE 5.0 09/19/2016 1129   GLUCOSEU NEGATIVE 09/19/2016 1129   GLUCOSEU > or = 1000 mg/dL (AA) 07/11/2008 0734   HGBUR LARGE (A) 09/19/2016 1129   BILIRUBINUR NEGATIVE 09/19/2016 1129   BILIRUBINUR n 08/08/2015 1010   KETONESUR NEGATIVE 09/19/2016 1129   PROTEINUR NEGATIVE 09/19/2016 1129   UROBILINOGEN 0.2 08/08/2015 1010   UROBILINOGEN 0.2 mg/dL 07/11/2008 0734   NITRITE NEGATIVE 09/19/2016 1129   LEUKOCYTESUR NEGATIVE 09/19/2016 1129   Sepsis Labs Invalid input(s): PROCALCITONIN,  WBC,  LACTICIDVEN Microbiology Recent Results (from the past 240 hour(s))  Gastrointestinal Panel by PCR , Stool     Status: None   Collection Time: 09/19/16  4:47 AM  Result Value Ref Range Status   Campylobacter species NOT DETECTED NOT DETECTED Final   Plesimonas shigelloides NOT DETECTED NOT DETECTED Final   Salmonella species NOT DETECTED NOT DETECTED Final   Yersinia enterocolitica NOT DETECTED NOT DETECTED Final   Vibrio species NOT DETECTED NOT DETECTED Final   Vibrio cholerae NOT DETECTED NOT DETECTED Final   Enteroaggregative E coli (EAEC) NOT DETECTED NOT DETECTED Final   Enteropathogenic E coli (EPEC) NOT DETECTED NOT DETECTED Final   Enterotoxigenic E coli (ETEC) NOT DETECTED NOT DETECTED Final   Shiga like toxin producing E coli (STEC) NOT DETECTED NOT DETECTED Final   Shigella/Enteroinvasive E coli (EIEC) NOT DETECTED NOT DETECTED Final   Cryptosporidium NOT  DETECTED NOT DETECTED Final   Cyclospora cayetanensis NOT DETECTED NOT DETECTED Final   Entamoeba histolytica NOT DETECTED NOT DETECTED Final   Giardia lamblia NOT DETECTED NOT DETECTED Final   Adenovirus F40/41 NOT DETECTED NOT DETECTED Final   Astrovirus NOT DETECTED NOT DETECTED Final   Norovirus GI/GII NOT DETECTED NOT DETECTED Final   Rotavirus A NOT DETECTED NOT DETECTED Final   Sapovirus (I, II, IV, and V) NOT DETECTED NOT DETECTED Final  C difficile quick scan w PCR reflex     Status: Abnormal   Collection Time: 09/19/16  4:47 AM  Result Value Ref Range Status   C Diff antigen POSITIVE (A) NEGATIVE Final   C Diff toxin NEGATIVE NEGATIVE Final   C Diff interpretation Results are indeterminate. See PCR results.  Final  Clostridium Difficile by PCR     Status: Abnormal   Collection Time: 09/19/16  4:47 AM  Result Value Ref Range Status   Toxigenic C Difficile by pcr POSITIVE (A) NEGATIVE Final    Comment: Positive for toxigenic C. difficile with little to no toxin production. Only treat if clinical presentation suggests symptomatic illness. Performed at [facility], [place] [address], [place],  [address]   Culture, Urine     Status: None   Collection Time: 09/19/16 11:29 AM  Result Value Ref Range Status   Specimen Description URINE, RANDOM  Final   Special Requests NONE  Final   Culture  Final    NO GROWTH Performed at [facility], [place] [address], [place], [place] [address]    Report Status 09/20/2016 FINAL  Final  Culture, blood (routine x 2) Call MD if unable to obtain prior to antibiotics being given     Status: None (Preliminary result)   Collection Time: 09/21/16 11:59 AM  Result Value Ref Range Status   Specimen Description BLOOD RIGHT ARM  Final   Special Requests   Final    BOTTLES DRAWN AEROBIC AND ANAEROBIC Blood Culture adequate volume   Culture   Final    NO GROWTH 4 DAYS Performed at [facility], [place] [address], [place], [place]  [address]    Report Status PENDING  Incomplete  Culture, blood (routine x 2) Call MD if unable to obtain prior to antibiotics being given     Status: None (Preliminary result)   Collection Time: 09/21/16 12:08 PM  Result Value Ref Range Status   Specimen Description BLOOD RIGHT HAND  Final   Special Requests IN PEDIATRIC BOTTLE Blood Culture adequate volume  Final   Culture   Final    NO GROWTH 4 DAYS Performed at [facility], [place] [address] [place], [place] [address]    Report Status PENDING  Incomplete  Culture, sputum-assessment     Status: None   Collection Time: 09/21/16  1:24 PM  Result Value Ref Range Status   Specimen Description SPU  Final   Special Requests NONE  Final   Sputum evaluation   Final    Sputum specimen not acceptable for testing.  Please recollect.   CALLED A SCOTT,RN @1149  09/24/16 MKELLY   Report Status 09/24/2016 FINAL  Final  MRSA PCR Screening     Status: None   Collection Time: 09/24/16 10:56 AM  Result Value Ref Range Status   MRSA by PCR NEGATIVE NEGATIVE Final    Comment:        The GeneXpert MRSA Assay (FDA approved for NASAL specimens only), is one component of a comprehensive MRSA colonization surveillance program. It is not intended to diagnose MRSA infection nor to guide or monitor treatment for MRSA infections.      Time coordinating discharge: Over 30 minutes  SIGNED:   Debbe Odea, MD  [facility] 09/26/2016, 10:06 AM Pager   If 7PM-7AM, please contact night-coverage [email or web address] Password [email or web address]

## 2016-09-27 ENCOUNTER — Telehealth: Payer: Self-pay

## 2016-09-27 NOTE — Telephone Encounter (Signed)
LMTCB

## 2016-09-27 NOTE — Telephone Encounter (Signed)
noted thanks  

## 2016-09-27 NOTE — Telephone Encounter (Signed)
D/C 09/26/16 To: home  Spoke with pt and he states that he is feeling much better. Clarified his medications as he was given Vanc capsule and liquid. Per chart notes Dr. Wynelle Cleveland wanted him to get whichever was available, he got capsules from [facility]. Advised him on precautions concerning C-Diff. He voiced understanding. No other questions or concerns at this time.   Appt made with Dr. Yong Channel 10/03/16, pt aware.    Transition Care Management Follow-up Telephone Call  How have you been since you were released from the hospital? Improving, still some diarrhea   Do you understand why you were in the hospital? yes   Do you understand the discharge instrcutions? yes  Items Reviewed:  Medications reviewed: yes  Allergies reviewed: yes  Dietary changes reviewed: yes  Referrals reviewed: yes   Functional Questionnaire:   Activities of Daily Living (ADLs):   He states they are independent in the following: ambulation, bathing and hygiene, feeding, continence, grooming, toileting and dressing States they require assistance with the following: none   Any transportation issues/concerns?: no   Any patient concerns? no   Confirmed importance and date/time of follow-up visits scheduled: yes   Confirmed with patient if condition begins to worsen call PCP or go to the ER.  Patient was given the Call-a-Nurse line [phone]: yes

## 2016-10-03 ENCOUNTER — Ambulatory Visit: Payer: PPO | Admitting: Family Medicine

## 2016-10-07 ENCOUNTER — Ambulatory Visit (INDEPENDENT_AMBULATORY_CARE_PROVIDER_SITE_OTHER): Payer: PPO | Admitting: Family Medicine

## 2016-10-07 ENCOUNTER — Encounter: Payer: Self-pay | Admitting: Family Medicine

## 2016-10-07 VITALS — BP 118/68 | HR 86 | Temp 98.4°F | Ht 72.0 in | Wt 299.4 lb

## 2016-10-07 DIAGNOSIS — E11311 Type 2 diabetes mellitus with unspecified diabetic retinopathy with macular edema: Secondary | ICD-10-CM | POA: Diagnosis not present

## 2016-10-07 DIAGNOSIS — J690 Pneumonitis due to inhalation of food and vomit: Secondary | ICD-10-CM

## 2016-10-07 DIAGNOSIS — Z794 Long term (current) use of insulin: Secondary | ICD-10-CM | POA: Diagnosis not present

## 2016-10-07 DIAGNOSIS — I700 Atherosclerosis of aorta: Secondary | ICD-10-CM | POA: Diagnosis not present

## 2016-10-07 DIAGNOSIS — E1165 Type 2 diabetes mellitus with hyperglycemia: Secondary | ICD-10-CM

## 2016-10-07 DIAGNOSIS — N179 Acute kidney failure, unspecified: Secondary | ICD-10-CM

## 2016-10-07 DIAGNOSIS — R011 Cardiac murmur, unspecified: Secondary | ICD-10-CM | POA: Diagnosis not present

## 2016-10-07 DIAGNOSIS — A0472 Enterocolitis due to Clostridium difficile, not specified as recurrent: Secondary | ICD-10-CM

## 2016-10-07 LAB — CBC WITH DIFFERENTIAL/PLATELET
BASOS ABS: 0 10*3/uL (ref 0.0–0.1)
Basophils Relative: 0.5 % (ref 0.0–3.0)
EOS PCT: 10.9 % — AB (ref 0.0–5.0)
Eosinophils Absolute: 0.7 10*3/uL (ref 0.0–0.7)
HCT: 39.1 % (ref 39.0–52.0)
Hemoglobin: 12.9 g/dL — ABNORMAL LOW (ref 13.0–17.0)
LYMPHS ABS: 2.3 10*3/uL (ref 0.7–4.0)
Lymphocytes Relative: 38.4 % (ref 12.0–46.0)
MCHC: 33 g/dL (ref 30.0–36.0)
MCV: 89.4 fl (ref 78.0–100.0)
MONO ABS: 0.4 10*3/uL (ref 0.1–1.0)
MONOS PCT: 7.3 % (ref 3.0–12.0)
NEUTROS ABS: 2.6 10*3/uL (ref 1.4–7.7)
NEUTROS PCT: 42.9 % — AB (ref 43.0–77.0)
PLATELETS: 324 10*3/uL (ref 150.0–400.0)
RBC: 4.38 Mil/uL (ref 4.22–5.81)
RDW: 14.7 % (ref 11.5–15.5)
WBC: 6.1 10*3/uL (ref 4.0–10.5)

## 2016-10-07 LAB — BASIC METABOLIC PANEL
BUN: 19 mg/dL (ref 6–23)
CO2: 26 meq/L (ref 19–32)
Calcium: 9.6 mg/dL (ref 8.4–10.5)
Chloride: 100 mEq/L (ref 96–112)
Creatinine, Ser: 1.45 mg/dL (ref 0.40–1.50)
GFR: 50.82 mL/min — ABNORMAL LOW (ref >60.00)
Glucose, Bld: 85 mg/dL (ref 70–99)
Potassium: 4.9 mEq/L (ref 3.5–5.1)
SODIUM: 135 meq/L (ref 135–145)

## 2016-10-07 NOTE — Assessment & Plan Note (Addendum)
Adjusting insulin down for now as CBGs have been lower away from his typical diet. He will follow up with Dr. Loanne Drilling later this month. Did not check cbg but he requested some juice this AM and was given small amount of apple juice

## 2016-10-07 NOTE — Assessment & Plan Note (Signed)
Improved during hospitalization. Slight increase in creatinine today- will have him cut his arb/hctz combo in half short term. Repeat bmet next visit

## 2016-10-07 NOTE — Assessment & Plan Note (Signed)
Has at least 8 more days of antibiotics (1 day of augmentin plus 7 days pass) with oral vancomycin. Improving steadily

## 2016-10-07 NOTE — Assessment & Plan Note (Signed)
Need to continue efforts to control diabetes, hypertension, hyperlipidemia

## 2016-10-07 NOTE — Assessment & Plan Note (Signed)
Weight loss down- some likely due to colitis unfortunately

## 2016-10-07 NOTE — Patient Instructions (Signed)
Glad you are doing better  Please finish antibiotic  Please go to [facility] in 1 month - located [address] from [place] - in the basement - Hours: 8:30-5:30 PM M-F. Do not need appointment.   Please stop by lab before you go  Let us know if new or recurrent symptoms

## 2016-10-07 NOTE — Assessment & Plan Note (Signed)
Finishing antibiotic course- some bilateral crackles. Will get CXR 1 month to ensure resolution of pneumonia.

## 2016-10-07 NOTE — Progress Notes (Signed)
Subjective:  Martin [name] is a 71 y.o. year old very pleasant male patient who presents for transitional care management and hospital follow up for C. Difficile colitis and also with multifocal pneumonia. Patient was hospitalized 09/18/16 from to 09/26/16 . A TCM phone call was completed on 09/27/16. Medical complexity moderate   Patient went to ER after episode of vomiting, abdominal pain, and multiple episodes of diarrhea. He was found to be septic due to the illness.  He states symptoms were so severe he thought he was dying. Worried about his heart but luckily no MI was discovered C. Diff PCR was positive and treated with vancomycin with good improvement in symptoms. By time of discharge was having solid bowel movements and tolerating solid food. Plan was to continue vancomycin oral 1 week past treatment for pneumonia noted below. Patient states diet and appetite are progressing- he is compliant with his vancomycin.   During hospital stay developed increasing cough and initial CXR was equivocal- proceeded with CT scan which showed multifocal pneumonia worst in RLL. It was thought likely due to vomiting early in illness.  He was started on iv antibiotics and transitioned to Augmentin. He has had good improvement with this. Also initially with prereanal AKI which improved with hydration. His arb/hctz combo was held in the hospital  Did have urinary retention and required foley catheter during stay- removed well before discharge.   Diabetes controlled on lantus and SSI- since he has been eating less/better he has hadto decrease insulin from Dr. Loanne Drilling by nealry 50%.   Morbid obesity- has lost weight in large part due to the GI illness.   I independently reviewed and interpreted 1. Initial chest x-ray which showed bilateral basiclar opacities- possible atelectasis vs. PNA. Views limited by obesity 2. CT scan on 09/21/16 shows multifocal pneumonia primarily in the bases of lungs and worse on right side.  Small effusion on the right side. Also noted atherosclerosis on aorta and coronary arteries 3. X-ray before discharge of chest- similar to findings from first x-ray with bilateral basilar opacities- this time known pneumonia based off prior CT scan. Effusion also noted in right lower lobe though small.    See problem oriented charting as well ROS- some decreased appetite but improving. CBG feels like going down at present- given apple juice. No chest pain or shortness of breath  Past Medical History-  Patient Active Problem List   Diagnosis Date Noted  . Undiagnosed cardiac murmurs 08/15/2015    Priority: High  . Morbid obesity ([place]) 04/12/2014    Priority: High  . DM (diabetes mellitus) type II uncontrolled with eye manifestation ([place]) 02/03/2007    Priority: High  . History of skin cancer 08/15/2015    Priority: Medium  . Diabetic retinopathy ([place]) 02/21/2015    Priority: Medium  . Hyperlipidemia 08/24/2014    Priority: Medium  . Diabetic polyneuropathy ([place]) 04/11/2009    Priority: Medium  . Essential hypertension 02/03/2007    Priority: Medium  . Former smoker 08/24/2014    Priority: Low  . Ingrowing toenail 07/19/2014    Priority: Low  . BACK PAIN, LUMBAR, WITH RADICULOPATHY 08/30/2008    Priority: Low  . Aortic atherosclerosis ([place]) 10/07/2016  . C. difficile colitis 09/25/2016  . Aspiration pneumonia ([place]) 09/25/2016  . AKI (acute kidney injury) ([place]) 09/18/2016    Medications- reviewed and updated  A medical reconciliation was performed comparing current medicines to hospital discharge medications. Current Outpatient Prescriptions  Medication Sig Dispense Refill  .  ACCU-CHEK COMPACT TEST DRUM test strip TEST AS DIRECTED 100 each 3  . BESIVANCE 0.6 % SUSP Place 1 drop into the left eye 3 (three) times daily.  1  . chlorpheniramine-HYDROcodone (TUSSIONEX) 10-8 MG/5ML SUER Take 5 mLs by mouth every 12 (twelve) hours. 115 mL 0  . Difluprednate (DUREZOL) 0.05 % EMUL  Place 1 drop into the left eye 3 (three) times daily.    Marland Kitchen glucose blood test strip 100 each by Other route 3 (three) times daily. Use as instructed 100 each 12  . Insulin Glargine (LANTUS SOLOSTAR) 100 UNIT/ML Solostar Pen Inject 320 Units into the skin every morning. 105 mL 11  . NOVOFINE 32G X 6 MM MISC USE 2 TIMES DAILY 100 each 3  . valsartan-hydrochlorothiazide (DIOVAN-HCT) 320-25 MG tablet TAKE 1 TABLET BY MOUTH DAILY. 30 tablet 5  . vancomycin (VANCOCIN) 250 MG capsule Take 1 capsule (250 mg total) by mouth 4 (four) times daily. 48 capsule 0   No current facility-administered medications for this visit.     Objective: BP 118/68 (BP Location: Left Arm, Patient Position: Sitting, Cuff Size: Large)   Pulse 86   Temp 98.4 F (36.9 C) (Oral)   Ht 6' (1.829 m)   Wt 299 lb 6.4 oz (135.8 kg)   SpO2 95%   BMI 40.61 kg/m  Gen: NAD, resting comfortably CV: RRR no murmurs rubs or gallops Lungs: CTAB. Slight crackles bilateral bases.  Abdomen: morbidly obese Ext: trace edema Skin: warm, dry  Assessment/Plan:  Aortic atherosclerosis (HCC) Need to continue efforts to control diabetes, hypertension, hyperlipidemia  AKI (acute kidney injury) ([place]) Improved during hospitalization. Slight increase in creatinine today- will have him cut his arb/hctz combo in half short term. Repeat bmet next visit  Aspiration pneumonia ([place]) Finishing antibiotic course- some bilateral crackles. Will get CXR 1 month to ensure resolution of pneumonia.   C. difficile colitis Has at least 8 more days of antibiotics (1 day of augmentin plus 7 days pass) with oral vancomycin. Improving steadily  DM (diabetes mellitus) type II uncontrolled with eye manifestation (HCC) Adjusting insulin down for now as CBGs have been lower away from his typical diet. He will follow up with Dr. Loanne Drilling later this month. Did not check cbg but he requested some juice this AM and was given small amount of apple juice  Morbid  obesity (HCC) Weight loss down- some likely due to colitis unfortunately  Advised 6 months at latest- 3-4 months more reasonable verbal  Orders Placed This Encounter  Procedures  . DG Chest 2 View    Standing Status:   Future    Standing Expiration Date:   12/07/2017    Order Specific Question:   Reason for Exam (SYMPTOM  OR DIAGNOSIS REQUIRED)    Answer:   follow up chest x-ray for pneumonia on ct    Order Specific Question:   Preferred imaging location?    Answer:   [facility]  . CBC with Differential/Platelet  . Basic metabolic panel    [facility]   Return precautions advised.  [name], MD

## 2016-10-09 ENCOUNTER — Encounter (INDEPENDENT_AMBULATORY_CARE_PROVIDER_SITE_OTHER): Payer: PPO | Admitting: Ophthalmology

## 2016-10-09 DIAGNOSIS — H35033 Hypertensive retinopathy, bilateral: Secondary | ICD-10-CM

## 2016-10-09 DIAGNOSIS — E113313 Type 2 diabetes mellitus with moderate nonproliferative diabetic retinopathy with macular edema, bilateral: Secondary | ICD-10-CM

## 2016-10-09 DIAGNOSIS — H43813 Vitreous degeneration, bilateral: Secondary | ICD-10-CM | POA: Diagnosis not present

## 2016-10-09 DIAGNOSIS — E11311 Type 2 diabetes mellitus with unspecified diabetic retinopathy with macular edema: Secondary | ICD-10-CM

## 2016-10-09 DIAGNOSIS — I10 Essential (primary) hypertension: Secondary | ICD-10-CM

## 2016-10-10 ENCOUNTER — Other Ambulatory Visit: Payer: Self-pay | Admitting: Endocrinology

## 2016-10-10 NOTE — Telephone Encounter (Signed)
Patient called stating he needed a new test meter called in. Asked for One Touch Verio IQ, the test strips (states he uses 4 per day) and the lancets for the meter. Asked to have it called into:  [id number] - [name], [place] - [address] [phone] (Phone) [phone] (Fax)

## 2016-10-10 NOTE — Telephone Encounter (Signed)
Dr. Cruzita Lederer could you please advise if we could increase the testing frequency to 4 times per day? Dr. Cordelia Pen last office note stated to check twice daily. Thanks!

## 2016-10-22 ENCOUNTER — Encounter: Payer: Self-pay | Admitting: Endocrinology

## 2016-10-22 ENCOUNTER — Ambulatory Visit (INDEPENDENT_AMBULATORY_CARE_PROVIDER_SITE_OTHER): Payer: PPO | Admitting: Endocrinology

## 2016-10-22 VITALS — BP 130/82 | HR 80 | Wt 300.0 lb

## 2016-10-22 DIAGNOSIS — E11311 Type 2 diabetes mellitus with unspecified diabetic retinopathy with macular edema: Secondary | ICD-10-CM

## 2016-10-22 DIAGNOSIS — E1165 Type 2 diabetes mellitus with hyperglycemia: Secondary | ICD-10-CM

## 2016-10-22 DIAGNOSIS — Z794 Long term (current) use of insulin: Secondary | ICD-10-CM

## 2016-10-22 LAB — POCT GLYCOSYLATED HEMOGLOBIN (HGB A1C): HEMOGLOBIN A1C: 6.8

## 2016-10-22 MED ORDER — GLUCOSE BLOOD VI STRP
1.0000 | ORAL_STRIP | Freq: Four times a day (QID) | 12 refills | Status: DC
Start: 2016-10-22 — End: 2017-10-10

## 2016-10-22 MED ORDER — INSULIN GLARGINE 100 UNIT/ML SOLOSTAR PEN: 130.0000 [IU] | PEN_INJECTOR | SUBCUTANEOUS | 11 refills | Status: DC

## 2016-10-22 MED ORDER — ONETOUCH VERIO SYNC SYSTEM W/DEVICE KIT: 1.0000 | PACK | Freq: Once | 0 refills | Status: AC

## 2016-10-22 NOTE — Progress Notes (Signed)
Subjective:    Patient ID: Martin [name], male    DOB: July 21, 1945, 71 y.o.   MRN: [id number]  HPI Pt returns for f/u of diabetes mellitus: DM type: Insulin-requiring type 2.  Dx'ed: 5400 Complications: polyneuropathy, foot ulcer, and retinopathy.  Therapy: insulin since 2013.  DKA: never.   Severe hypoglycemia: never.  Pancreatitis: never.   Other: he chose to change to a qd insulin regimen, after poor results with multiple daily injections; he declines weight loss surgery.   Interval history: Pt says he reduced lantus to 150 units qam, due to hypoglycemia after a hospitalization.  no cbg record, but states cbg's vary from 60-230.  It is in general higher as the day goes on.   Past Medical History:  Diagnosis Date  . Diabetes mellitus   . Hypertension     Past Surgical History:  Procedure Laterality Date  . HERNIA REPAIR     >10 years ago  . right shoulder surgery     around 2014    Social History   Social History  . Marital status: Married    Spouse name: N/A  . Number of children: N/A  . Years of education: N/A   Occupational History  . Not on file.   Social History Main Topics  . Smoking status: Former Smoker    Quit date: 04/29/2000  . Smokeless tobacco: Never Used     Comment: smoked for 10 years on and off  . Alcohol use 0.0 oz/week     Comment: 6 martinis a year  . Drug use: No  . Sexual activity: Yes   Other Topics Concern  . Not on file   Social History Narrative   Married. 3 kids. 7 grandkids. No greatgrandkids.       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 BESIVANCE 0.6 % SUSP Place 1 drop into the left eye 3 (three) times daily.  1  . chlorpheniramine-HYDROcodone (TUSSIONEX) 10-8 MG/5ML SUER Take 5 mLs by mouth every 12 (twelve) hours. 115 mL 0  . Difluprednate (DUREZOL) 0.05 %  EMUL Place 1 drop into the left eye 3 (three) times daily.    Marland Kitchen NOVOFINE 32G X 6 MM MISC USE 2 TIMES DAILY 100 each 3  . valsartan-hydrochlorothiazide (DIOVAN-HCT) 320-25 MG tablet TAKE 1 TABLET BY MOUTH DAILY. 30 tablet 5  . vancomycin (VANCOCIN) 250 MG capsule Take 1 capsule (250 mg total) by mouth 4 (four) times daily. 48 capsule 0   No current facility-administered medications on file prior to visit.     Allergies  Allergen Reactions  . Simvastatin Diarrhea    Family History  Problem Relation Age of Onset  . Hypertension Mother   . Heart disease Mother        CHF did not see doctor  . Diabetes Maternal Grandmother   . Diabetes Son     BP 130/82 (BP Location: Left Arm, Patient Position: Sitting)   Pulse 80   Wt 300 lb (136.1 kg)   SpO2 98%   BMI 40.69 kg/m   Review of Systems Denies LOC    Objective:   Physical Exam VITAL SIGNS: See vs page.    GENERAL: no distress.   Pulses: dorsalis pedis intact bilat. MSK: no deformity of the feet.  CV: 1+ bilat leg edema.  Skin: 1  cm shallow ulcer at the plantar aspect of the left foot. normal color and temp on the feet.  Neuro: sensation is intact to touch on the feet, but severely decreased from normal.   Lab Results  Component Value Date   HGBA1C 6.8 10/22/2016      Assessment & Plan:  Insulin-requiring type 2 DM, with retinopathy: overcontrolled, given this regimen, which does match insulin to her changing needs throughout the day    Patient Instructions  check your blood sugar 4 times a day.  vary the time of day when you check, between before the 3 meals, and at bedtime.  also check if you have symptoms of your blood sugar being too high or too low.  please keep a record of the readings and bring it to your next appointment here.  You can write it on any piece of paper.  please call us sooner if your blood sugar goes below 70, or if you have a lot of readings over 200.   Keep the area on your left foot covered  with antibiotic ointment and a bandaid.  Call if it gets worse.   Please reduce the lantus to 130 units each morning.  lease come back for a follow-up appointment in 3 months.

## 2016-10-22 NOTE — Patient Instructions (Addendum)
check your blood sugar 4 times a day.  vary the time of day when you check, between before the 3 meals, and at bedtime.  also check if you have symptoms of your blood sugar being too high or too low.  please keep a record of the readings and bring it to your next appointment here.  You can write it on any piece of paper.  please call us sooner if your blood sugar goes below 70, or if you have a lot of readings over 200.   Keep the area on your left foot covered with antibiotic ointment and a bandaid.  Call if it gets worse.   Please reduce the lantus to 130 units each morning.  lease come back for a follow-up appointment in 3 months.

## 2016-10-28 DIAGNOSIS — M7989 Other specified soft tissue disorders: Secondary | ICD-10-CM | POA: Diagnosis not present

## 2016-10-28 DIAGNOSIS — Z794 Long term (current) use of insulin: Secondary | ICD-10-CM | POA: Diagnosis not present

## 2016-10-28 DIAGNOSIS — E119 Type 2 diabetes mellitus without complications: Secondary | ICD-10-CM | POA: Diagnosis not present

## 2016-10-28 DIAGNOSIS — L03032 Cellulitis of left toe: Secondary | ICD-10-CM | POA: Diagnosis not present

## 2016-10-28 DIAGNOSIS — Z87891 Personal history of nicotine dependence: Secondary | ICD-10-CM | POA: Diagnosis not present

## 2016-10-28 DIAGNOSIS — L0390 Cellulitis, unspecified: Secondary | ICD-10-CM | POA: Diagnosis not present

## 2016-10-29 DIAGNOSIS — Z794 Long term (current) use of insulin: Secondary | ICD-10-CM | POA: Diagnosis not present

## 2016-10-29 DIAGNOSIS — T25232D Burn of second degree of left toe(s) (nail), subsequent encounter: Secondary | ICD-10-CM | POA: Diagnosis not present

## 2016-10-29 DIAGNOSIS — E119 Type 2 diabetes mellitus without complications: Secondary | ICD-10-CM | POA: Diagnosis not present

## 2016-11-08 ENCOUNTER — Encounter (HOSPITAL_BASED_OUTPATIENT_CLINIC_OR_DEPARTMENT_OTHER): Payer: PPO | Attending: Internal Medicine

## 2016-11-08 DIAGNOSIS — X19XXXA Contact with other heat and hot substances, initial encounter: Secondary | ICD-10-CM | POA: Diagnosis not present

## 2016-11-08 DIAGNOSIS — L97521 Non-pressure chronic ulcer of other part of left foot limited to breakdown of skin: Secondary | ICD-10-CM | POA: Diagnosis not present

## 2016-11-08 DIAGNOSIS — L89892 Pressure ulcer of other site, stage 2: Secondary | ICD-10-CM | POA: Diagnosis not present

## 2016-11-08 DIAGNOSIS — E1140 Type 2 diabetes mellitus with diabetic neuropathy, unspecified: Secondary | ICD-10-CM | POA: Diagnosis not present

## 2016-11-08 DIAGNOSIS — T25222A Burn of second degree of left foot, initial encounter: Secondary | ICD-10-CM | POA: Diagnosis not present

## 2016-11-08 DIAGNOSIS — E11621 Type 2 diabetes mellitus with foot ulcer: Secondary | ICD-10-CM | POA: Insufficient documentation

## 2016-11-08 DIAGNOSIS — T25232A Burn of second degree of left toe(s) (nail), initial encounter: Secondary | ICD-10-CM | POA: Insufficient documentation

## 2016-11-08 DIAGNOSIS — I10 Essential (primary) hypertension: Secondary | ICD-10-CM | POA: Insufficient documentation

## 2016-11-11 DIAGNOSIS — L97521 Non-pressure chronic ulcer of other part of left foot limited to breakdown of skin: Secondary | ICD-10-CM | POA: Diagnosis not present

## 2016-11-13 ENCOUNTER — Encounter (INDEPENDENT_AMBULATORY_CARE_PROVIDER_SITE_OTHER): Payer: PPO | Admitting: Ophthalmology

## 2016-11-13 DIAGNOSIS — E113313 Type 2 diabetes mellitus with moderate nonproliferative diabetic retinopathy with macular edema, bilateral: Secondary | ICD-10-CM

## 2016-11-13 DIAGNOSIS — H35033 Hypertensive retinopathy, bilateral: Secondary | ICD-10-CM | POA: Diagnosis not present

## 2016-11-13 DIAGNOSIS — I10 Essential (primary) hypertension: Secondary | ICD-10-CM | POA: Diagnosis not present

## 2016-11-13 DIAGNOSIS — E11311 Type 2 diabetes mellitus with unspecified diabetic retinopathy with macular edema: Secondary | ICD-10-CM

## 2016-11-13 DIAGNOSIS — H43813 Vitreous degeneration, bilateral: Secondary | ICD-10-CM | POA: Diagnosis not present

## 2016-11-14 DIAGNOSIS — T25222A Burn of second degree of left foot, initial encounter: Secondary | ICD-10-CM | POA: Diagnosis not present

## 2016-11-14 DIAGNOSIS — E11621 Type 2 diabetes mellitus with foot ulcer: Secondary | ICD-10-CM | POA: Diagnosis not present

## 2016-11-14 DIAGNOSIS — L97529 Non-pressure chronic ulcer of other part of left foot with unspecified severity: Secondary | ICD-10-CM | POA: Diagnosis not present

## 2016-11-19 DIAGNOSIS — D1801 Hemangioma of skin and subcutaneous tissue: Secondary | ICD-10-CM | POA: Diagnosis not present

## 2016-11-19 DIAGNOSIS — L814 Other melanin hyperpigmentation: Secondary | ICD-10-CM | POA: Diagnosis not present

## 2016-11-19 DIAGNOSIS — Z85828 Personal history of other malignant neoplasm of skin: Secondary | ICD-10-CM | POA: Diagnosis not present

## 2016-11-19 DIAGNOSIS — L570 Actinic keratosis: Secondary | ICD-10-CM | POA: Diagnosis not present

## 2016-11-19 DIAGNOSIS — L821 Other seborrheic keratosis: Secondary | ICD-10-CM | POA: Diagnosis not present

## 2016-11-25 ENCOUNTER — Other Ambulatory Visit: Payer: Self-pay

## 2016-11-25 MED ORDER — INSULIN PEN NEEDLE 32G X 6 MM MISC: 3 refills | Status: DC

## 2016-11-28 ENCOUNTER — Other Ambulatory Visit: Payer: Self-pay | Admitting: Internal Medicine

## 2016-11-28 ENCOUNTER — Encounter (HOSPITAL_BASED_OUTPATIENT_CLINIC_OR_DEPARTMENT_OTHER): Payer: PPO | Attending: Internal Medicine

## 2016-11-28 DIAGNOSIS — E11621 Type 2 diabetes mellitus with foot ulcer: Secondary | ICD-10-CM | POA: Insufficient documentation

## 2016-11-28 DIAGNOSIS — L97526 Non-pressure chronic ulcer of other part of left foot with bone involvement without evidence of necrosis: Secondary | ICD-10-CM | POA: Diagnosis not present

## 2016-11-28 DIAGNOSIS — T25222A Burn of second degree of left foot, initial encounter: Secondary | ICD-10-CM | POA: Diagnosis not present

## 2016-11-28 DIAGNOSIS — L97521 Non-pressure chronic ulcer of other part of left foot limited to breakdown of skin: Secondary | ICD-10-CM | POA: Diagnosis not present

## 2016-11-28 DIAGNOSIS — E1140 Type 2 diabetes mellitus with diabetic neuropathy, unspecified: Secondary | ICD-10-CM | POA: Insufficient documentation

## 2016-11-28 DIAGNOSIS — I739 Peripheral vascular disease, unspecified: Secondary | ICD-10-CM

## 2016-11-28 DIAGNOSIS — I10 Essential (primary) hypertension: Secondary | ICD-10-CM | POA: Diagnosis not present

## 2016-12-03 ENCOUNTER — Encounter (HOSPITAL_COMMUNITY): Payer: PPO

## 2016-12-05 DIAGNOSIS — L97526 Non-pressure chronic ulcer of other part of left foot with bone involvement without evidence of necrosis: Secondary | ICD-10-CM | POA: Diagnosis not present

## 2016-12-05 DIAGNOSIS — E11621 Type 2 diabetes mellitus with foot ulcer: Secondary | ICD-10-CM | POA: Diagnosis not present

## 2016-12-10 ENCOUNTER — Other Ambulatory Visit: Payer: Self-pay | Admitting: Surgery

## 2016-12-10 ENCOUNTER — Ambulatory Visit
Admission: RE | Admit: 2016-12-10 | Discharge: 2016-12-10 | Disposition: A | Discharge status: Home / Self Care | Payer: PPO | Source: Ambulatory Visit | Attending: Surgery | Admitting: Surgery

## 2016-12-10 DIAGNOSIS — E13621 Other specified diabetes mellitus with foot ulcer: Secondary | ICD-10-CM

## 2016-12-10 DIAGNOSIS — L97529 Non-pressure chronic ulcer of other part of left foot with unspecified severity: Principal | ICD-10-CM

## 2016-12-10 DIAGNOSIS — S99922A Unspecified injury of left foot, initial encounter: Secondary | ICD-10-CM | POA: Diagnosis not present

## 2016-12-12 ENCOUNTER — Other Ambulatory Visit (HOSPITAL_COMMUNITY)
Admission: RE | Admit: 2016-12-12 | Discharge: 2016-12-12 | Disposition: A | Discharge status: Home / Self Care | Payer: PPO | Source: Other Acute Inpatient Hospital | Attending: Surgery | Admitting: Surgery

## 2016-12-12 ENCOUNTER — Other Ambulatory Visit: Payer: Self-pay | Admitting: Certified Registered Nurse Anesthetist

## 2016-12-12 DIAGNOSIS — M86172 Other acute osteomyelitis, left ankle and foot: Secondary | ICD-10-CM | POA: Diagnosis not present

## 2016-12-12 DIAGNOSIS — E11621 Type 2 diabetes mellitus with foot ulcer: Secondary | ICD-10-CM | POA: Insufficient documentation

## 2016-12-12 DIAGNOSIS — M7661 Achilles tendinitis, right leg: Secondary | ICD-10-CM | POA: Diagnosis not present

## 2016-12-12 DIAGNOSIS — L97521 Non-pressure chronic ulcer of other part of left foot limited to breakdown of skin: Secondary | ICD-10-CM | POA: Insufficient documentation

## 2016-12-12 DIAGNOSIS — T25232A Burn of second degree of left toe(s) (nail), initial encounter: Secondary | ICD-10-CM | POA: Diagnosis not present

## 2016-12-15 LAB — AEROBIC CULTURE  (SUPERFICIAL SPECIMEN)

## 2016-12-15 LAB — AEROBIC CULTURE W GRAM STAIN (SUPERFICIAL SPECIMEN)

## 2016-12-18 ENCOUNTER — Encounter (INDEPENDENT_AMBULATORY_CARE_PROVIDER_SITE_OTHER): Payer: PPO | Admitting: Ophthalmology

## 2016-12-18 DIAGNOSIS — I10 Essential (primary) hypertension: Secondary | ICD-10-CM | POA: Diagnosis not present

## 2016-12-18 DIAGNOSIS — H43813 Vitreous degeneration, bilateral: Secondary | ICD-10-CM

## 2016-12-18 DIAGNOSIS — E113313 Type 2 diabetes mellitus with moderate nonproliferative diabetic retinopathy with macular edema, bilateral: Secondary | ICD-10-CM | POA: Diagnosis not present

## 2016-12-18 DIAGNOSIS — E11311 Type 2 diabetes mellitus with unspecified diabetic retinopathy with macular edema: Secondary | ICD-10-CM

## 2016-12-18 DIAGNOSIS — H35033 Hypertensive retinopathy, bilateral: Secondary | ICD-10-CM

## 2016-12-19 DIAGNOSIS — E11621 Type 2 diabetes mellitus with foot ulcer: Secondary | ICD-10-CM | POA: Diagnosis not present

## 2016-12-19 DIAGNOSIS — T25222A Burn of second degree of left foot, initial encounter: Secondary | ICD-10-CM | POA: Diagnosis not present

## 2016-12-24 ENCOUNTER — Ambulatory Visit
Admission: RE | Admit: 2016-12-24 | Discharge: 2016-12-24 | Disposition: A | Discharge status: Home / Self Care | Payer: PPO | Source: Ambulatory Visit | Attending: Internal Medicine | Admitting: Internal Medicine

## 2016-12-24 DIAGNOSIS — I739 Peripheral vascular disease, unspecified: Secondary | ICD-10-CM

## 2016-12-24 DIAGNOSIS — L97521 Non-pressure chronic ulcer of other part of left foot limited to breakdown of skin: Secondary | ICD-10-CM | POA: Diagnosis not present

## 2016-12-24 DIAGNOSIS — Z9889 Other specified postprocedural states: Secondary | ICD-10-CM | POA: Diagnosis not present

## 2016-12-24 DIAGNOSIS — S91102A Unspecified open wound of left great toe without damage to nail, initial encounter: Secondary | ICD-10-CM | POA: Diagnosis not present

## 2016-12-24 HISTORY — PX: IR RADIOLOGIST EVAL & MGMT: IMG5224
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
## 2016-12-26 DIAGNOSIS — T25232A Burn of second degree of left toe(s) (nail), initial encounter: Secondary | ICD-10-CM | POA: Diagnosis not present

## 2016-12-26 DIAGNOSIS — E11621 Type 2 diabetes mellitus with foot ulcer: Secondary | ICD-10-CM | POA: Diagnosis not present

## 2017-01-02 ENCOUNTER — Encounter (HOSPITAL_BASED_OUTPATIENT_CLINIC_OR_DEPARTMENT_OTHER): Payer: PPO | Attending: Internal Medicine

## 2017-01-02 DIAGNOSIS — E1169 Type 2 diabetes mellitus with other specified complication: Secondary | ICD-10-CM | POA: Diagnosis not present

## 2017-01-02 DIAGNOSIS — E1140 Type 2 diabetes mellitus with diabetic neuropathy, unspecified: Secondary | ICD-10-CM | POA: Insufficient documentation

## 2017-01-02 DIAGNOSIS — L97526 Non-pressure chronic ulcer of other part of left foot with bone involvement without evidence of necrosis: Secondary | ICD-10-CM | POA: Diagnosis not present

## 2017-01-02 DIAGNOSIS — L97522 Non-pressure chronic ulcer of other part of left foot with fat layer exposed: Secondary | ICD-10-CM | POA: Insufficient documentation

## 2017-01-02 DIAGNOSIS — E11621 Type 2 diabetes mellitus with foot ulcer: Secondary | ICD-10-CM | POA: Diagnosis not present

## 2017-01-02 DIAGNOSIS — I10 Essential (primary) hypertension: Secondary | ICD-10-CM | POA: Insufficient documentation

## 2017-01-02 DIAGNOSIS — M869 Osteomyelitis, unspecified: Secondary | ICD-10-CM | POA: Insufficient documentation

## 2017-01-05 ENCOUNTER — Other Ambulatory Visit: Payer: Self-pay | Admitting: Family Medicine

## 2017-01-09 DIAGNOSIS — T25222A Burn of second degree of left foot, initial encounter: Secondary | ICD-10-CM | POA: Diagnosis not present

## 2017-01-09 DIAGNOSIS — E11621 Type 2 diabetes mellitus with foot ulcer: Secondary | ICD-10-CM | POA: Diagnosis not present

## 2017-01-16 DIAGNOSIS — E11621 Type 2 diabetes mellitus with foot ulcer: Secondary | ICD-10-CM | POA: Diagnosis not present

## 2017-01-16 DIAGNOSIS — L97522 Non-pressure chronic ulcer of other part of left foot with fat layer exposed: Secondary | ICD-10-CM | POA: Diagnosis not present

## 2017-01-22 ENCOUNTER — Encounter (INDEPENDENT_AMBULATORY_CARE_PROVIDER_SITE_OTHER): Payer: PPO | Admitting: Ophthalmology

## 2017-01-22 ENCOUNTER — Ambulatory Visit: Payer: PPO | Admitting: Endocrinology

## 2017-01-22 DIAGNOSIS — H43813 Vitreous degeneration, bilateral: Secondary | ICD-10-CM

## 2017-01-22 DIAGNOSIS — I10 Essential (primary) hypertension: Secondary | ICD-10-CM

## 2017-01-22 DIAGNOSIS — E11311 Type 2 diabetes mellitus with unspecified diabetic retinopathy with macular edema: Secondary | ICD-10-CM

## 2017-01-22 DIAGNOSIS — E113313 Type 2 diabetes mellitus with moderate nonproliferative diabetic retinopathy with macular edema, bilateral: Secondary | ICD-10-CM | POA: Diagnosis not present

## 2017-01-22 DIAGNOSIS — H35033 Hypertensive retinopathy, bilateral: Secondary | ICD-10-CM | POA: Diagnosis not present

## 2017-01-23 DIAGNOSIS — T25232A Burn of second degree of left toe(s) (nail), initial encounter: Secondary | ICD-10-CM | POA: Diagnosis not present

## 2017-01-23 DIAGNOSIS — E11621 Type 2 diabetes mellitus with foot ulcer: Secondary | ICD-10-CM | POA: Diagnosis not present

## 2017-02-06 ENCOUNTER — Encounter (HOSPITAL_BASED_OUTPATIENT_CLINIC_OR_DEPARTMENT_OTHER): Payer: PPO | Attending: Internal Medicine

## 2017-02-06 DIAGNOSIS — E119 Type 2 diabetes mellitus without complications: Secondary | ICD-10-CM | POA: Insufficient documentation

## 2017-02-06 DIAGNOSIS — Z09 Encounter for follow-up examination after completed treatment for conditions other than malignant neoplasm: Secondary | ICD-10-CM | POA: Diagnosis not present

## 2017-02-06 DIAGNOSIS — Z8631 Personal history of diabetic foot ulcer: Secondary | ICD-10-CM | POA: Insufficient documentation

## 2017-02-06 DIAGNOSIS — T25232A Burn of second degree of left toe(s) (nail), initial encounter: Secondary | ICD-10-CM | POA: Diagnosis not present

## 2017-02-10 ENCOUNTER — Encounter: Payer: Self-pay | Admitting: Interventional Radiology

## 2017-02-12 ENCOUNTER — Ambulatory Visit (INDEPENDENT_AMBULATORY_CARE_PROVIDER_SITE_OTHER): Payer: PPO | Admitting: Family Medicine

## 2017-02-12 ENCOUNTER — Encounter: Payer: Self-pay | Admitting: Family Medicine

## 2017-02-12 VITALS — BP 100/72 | HR 78 | Temp 98.0°F | Ht 72.0 in | Wt 300.4 lb

## 2017-02-12 DIAGNOSIS — S86011A Strain of right Achilles tendon, initial encounter: Secondary | ICD-10-CM | POA: Diagnosis not present

## 2017-02-12 DIAGNOSIS — I10 Essential (primary) hypertension: Secondary | ICD-10-CM | POA: Diagnosis not present

## 2017-02-12 DIAGNOSIS — E1142 Type 2 diabetes mellitus with diabetic polyneuropathy: Secondary | ICD-10-CM | POA: Diagnosis not present

## 2017-02-12 DIAGNOSIS — E785 Hyperlipidemia, unspecified: Secondary | ICD-10-CM | POA: Diagnosis not present

## 2017-02-12 DIAGNOSIS — Z23 Encounter for immunization: Secondary | ICD-10-CM

## 2017-02-12 LAB — COMPREHENSIVE METABOLIC PANEL
ALT: 18 U/L (ref 0–53)
AST: 20 U/L (ref 0–37)
Albumin: 4.3 g/dL (ref 3.5–5.2)
Alkaline Phosphatase: 51 U/L (ref 39–117)
BUN: 22 mg/dL (ref 6–23)
CALCIUM: 9.6 mg/dL (ref 8.4–10.5)
CHLORIDE: 100 meq/L (ref 96–112)
CO2: 26 meq/L (ref 19–32)
CREATININE: 1.23 mg/dL (ref 0.40–1.50)
GFR: 61.39 mL/min (ref >60.00)
Glucose, Bld: 182 mg/dL — ABNORMAL HIGH (ref 70–99)
POTASSIUM: 4.8 meq/L (ref 3.5–5.1)
SODIUM: 138 meq/L (ref 135–145)
Total Bilirubin: 0.9 mg/dL (ref 0.2–1.2)
Total Protein: 7.6 g/dL (ref 6.0–8.3)

## 2017-02-12 LAB — CBC
HEMATOCRIT: 38.7 % — AB (ref 39.0–52.0)
Hemoglobin: 12.6 g/dL — ABNORMAL LOW (ref 13.0–17.0)
MCHC: 32.7 g/dL (ref 30.0–36.0)
MCV: 88.9 fl (ref 78.0–100.0)
Platelets: 278 10*3/uL (ref 150.0–400.0)
RBC: 4.35 Mil/uL (ref 4.22–5.81)
RDW: 15.2 % (ref 11.5–15.5)
WBC: 7.8 10*3/uL (ref 4.0–10.5)

## 2017-02-12 LAB — VITAMIN B12: VITAMIN B 12: 243 pg/mL (ref 211–911)

## 2017-02-12 LAB — LDL CHOLESTEROL, DIRECT: Direct LDL: 139 mg/dL

## 2017-02-12 MED ORDER — AMITRIPTYLINE HCL 10 MG PO TABS: 10.0000 mg | ORAL_TABLET | Freq: Every day | ORAL | 5 refills | Status: DC

## 2017-02-12 NOTE — Patient Instructions (Addendum)
Start amitriptyline 10mg  daily- see if no dizziness but helps with neuropathy some. If helping some and not dizzy but not helping enough- let me know in a month and we will increase to 25mg   Sign release of information at the check out desk for last diabetic eye exam  Last year weight 315 and now at 300- keep up the slow steady progress!

## 2017-02-12 NOTE — Addendum Note (Signed)
Addended by: Marin Olp on: 02/12/2017 09:55 AM   Modules accepted: Orders

## 2017-02-12 NOTE — Assessment & Plan Note (Signed)
S: partial achilles tear severa weeks ago. Went to Dr. Gladstone Lighter after 2 weeks and was told really nothing could be done. Since then he has had more gait and balance issues with achilles bothering him along with neuropathy. Walking with cane now and encouraged him to continue this.  A/P: refer to PT for their expertise in helping with gait and balance as well as potential exercises/stretches for the achilles.

## 2017-02-12 NOTE — Progress Notes (Signed)
Subjective:  Martin [name] is a 71 y.o. year old very pleasant male patient who presents for/with See problem oriented charting ROS- some gait and balance issues, worsening neuropathy. No chest pain or shortness of breath   Past Medical History-  Patient Active Problem List   Diagnosis Date Noted  . Undiagnosed cardiac murmurs 08/15/2015    Priority: High  . Morbid obesity ([place]) 04/12/2014    Priority: High  . DM (diabetes mellitus) type II uncontrolled with eye manifestation ([place]) 02/03/2007    Priority: High  . Partial tear of right Achilles tendon 02/12/2017    Priority: Medium  . History of skin cancer 08/15/2015    Priority: Medium  . Diabetic retinopathy ([place]) 02/21/2015    Priority: Medium  . Hyperlipidemia 08/24/2014    Priority: Medium  . Diabetic polyneuropathy ([place]) 04/11/2009    Priority: Medium  . Essential hypertension 02/03/2007    Priority: Medium  . Former smoker 08/24/2014    Priority: Low  . Ingrowing toenail 07/19/2014    Priority: Low  . BACK PAIN, LUMBAR, WITH RADICULOPATHY 08/30/2008    Priority: Low  . Aortic atherosclerosis ([place]) 10/07/2016  . C. difficile colitis 09/25/2016  . Aspiration pneumonia ([place]) 09/25/2016  . AKI (acute kidney injury) ([place]) 09/18/2016    Medications- reviewed and updated Current Outpatient Prescriptions  Medication Sig Dispense Refill  . amitriptyline (ELAVIL) 10 MG tablet Take 1 tablet (10 mg total) by mouth at bedtime. 30 tablet 5  . BESIVANCE 0.6 % SUSP Place 1 drop into the left eye 3 (three) times daily.  1  . doxycycline (VIBRAMYCIN) 100 MG capsule Take 100 mg by mouth 2 (two) times daily.    Marland Kitchen glucose blood (ONETOUCH VERIO) test strip 1 each by Other route 4 (four) times daily. And lancets 4/day 120 each 12  . Insulin Glargine (LANTUS SOLOSTAR) 100 UNIT/ML Solostar Pen Inject 130 Units into the skin every morning. 15 pen 11  . Insulin Pen Needle (NOVOFINE) 32G X 6 MM MISC USE 5 TIMES DAILY 300 each 3  . silver  sulfADIAZINE (SILVADENE) 1 % cream     . valsartan-hydrochlorothiazide (DIOVAN-HCT) 320-25 MG tablet TAKE 1 TABLET BY MOUTH DAILY. 30 tablet 3   Objective: BP 100/72 (BP Location: Left Arm, Patient Position: Sitting, Cuff Size: Large)   Pulse 78   Temp 98 F (36.7 C) (Oral)   Ht 6' (1.829 m)   Wt (!) 300 lb 6.4 oz (136.3 kg)   SpO2 98%   BMI 40.74 kg/m  Gen: NAD, resting comfortably CV: RRR no murmurs heard today rubs or gallops Lungs: CTAB no crackles, wheeze, rhonchi Abdomen: soft/nontender/nondistended. obese Ext: 1+ edema, thickened achilles on the right Skin: warm, dry Neuro: walking with cane  Assessment/Plan:  Last year weight 315 and now at 300  Diabetic polyneuropathy (HCC) S: on amitriptyline 50mg  in the past. Had been off and went back on and got dizzy with it. Feeling in feet and fingertips A/P: Start amitriptyline 10mg  daily- see if no dizziness but helps with neuropathy some. If helping some and not dizzy but not helping enough- let me know in a month and we will increase to 25mg   Wants to consider lyrica  Essential hypertension S: controlled on  valsartan 320-25mg  (declines his med was recalled)  BP Readings from Last 3 Encounters:  12/24/16 (!) 150/81  10/22/16 130/82  10/07/16 118/68  A/P:blood pressure goal of <140/90. Continue current meds. prior creatinine elevation- short term was to cut arb/hctz combo  in half and planned bmet today  Hyperlipidemia S: poorly controlled on last check on no statin- we trialed livalo last year- states rarely took this- has not taking regularly recently. No myalgias.  Lab Results  Component Value Date   CHOL 198 08/08/2015   HDL 33.00 (L) 08/08/2015   LDLCALC 127 (H) 08/08/2015   LDLDIRECT 158.0 04/05/2014   TRIG 189.0 (H) 08/08/2015   CHOLHDL 6 08/08/2015   A/P: update LDL and if over 100 likely restart livalo low dose- he has some at home left over  Partial tear of right Achilles tendon S: partial achilles tear  severa weeks ago. Went to Dr. Gladstone Lighter after 2 weeks and was told really nothing could be done. Since then he has had more gait and balance issues with achilles bothering him along with neuropathy. Walking with cane now and encouraged him to continue this.  A/P: refer to PT for their expertise in helping with gait and balance as well as potential exercises/stretches for the achilles.   Future Appointments Date Time Provider [place]  02/19/2017 7:45 AM Renato Shin, MD [facility] None  02/26/2017 8:15 AM Hayden Pedro, MD [facility] None   3 months advised  Orders Placed This Encounter  Procedures  . Flu vaccine HIGH DOSE PF  . LDL cholesterol, direct    [place]  . CBC    [place]  . Comprehensive metabolic panel    [place]  . Vitamin B12  nonfasting labs  Meds ordered this encounter  Medications  . amitriptyline (ELAVIL) 10 MG tablet    Sig: Take 1 tablet (10 mg total) by mouth at bedtime.    Dispense:  30 tablet    Refill:  5   Return precautions advised.  [name], MD

## 2017-02-12 NOTE — Assessment & Plan Note (Signed)
S: on amitriptyline 50mg  in the past. Had been off and went back on and got dizzy with it. Feeling in feet and fingertips A/P: Start amitriptyline 10mg  daily- see if no dizziness but helps with neuropathy some. If helping some and not dizzy but not helping enough- let me know in a month and we will increase to 25mg   Wants to consider lyrica

## 2017-02-12 NOTE — Assessment & Plan Note (Signed)
S: poorly controlled on last check on no statin- we trialed livalo last year- states rarely took this- has not taking regularly recently. No myalgias.  Lab Results  Component Value Date   CHOL 198 08/08/2015   HDL 33.00 (L) 08/08/2015   LDLCALC 127 (H) 08/08/2015   LDLDIRECT 158.0 04/05/2014   TRIG 189.0 (H) 08/08/2015   CHOLHDL 6 08/08/2015   A/P: update LDL and if over 100 likely restart livalo low dose- he has some at home left over

## 2017-02-12 NOTE — Assessment & Plan Note (Signed)
S: controlled on  valsartan 320-25mg  (declines his med was recalled)  BP Readings from Last 3 Encounters:  12/24/16 (!) 150/81  10/22/16 130/82  10/07/16 118/68  A/P:blood pressure goal of <140/90. Continue current meds. prior creatinine elevation- short term was to cut arb/hctz combo in half and planned bmet today

## 2017-02-19 ENCOUNTER — Encounter: Payer: Self-pay | Admitting: Endocrinology

## 2017-02-19 ENCOUNTER — Ambulatory Visit (INDEPENDENT_AMBULATORY_CARE_PROVIDER_SITE_OTHER): Payer: PPO | Admitting: Physical Therapy

## 2017-02-19 ENCOUNTER — Ambulatory Visit (INDEPENDENT_AMBULATORY_CARE_PROVIDER_SITE_OTHER): Payer: PPO | Admitting: Endocrinology

## 2017-02-19 VITALS — BP 148/90 | HR 85 | Wt 303.6 lb

## 2017-02-19 DIAGNOSIS — E1165 Type 2 diabetes mellitus with hyperglycemia: Secondary | ICD-10-CM

## 2017-02-19 DIAGNOSIS — E1139 Type 2 diabetes mellitus with other diabetic ophthalmic complication: Secondary | ICD-10-CM

## 2017-02-19 DIAGNOSIS — M25671 Stiffness of right ankle, not elsewhere classified: Secondary | ICD-10-CM | POA: Diagnosis not present

## 2017-02-19 DIAGNOSIS — R2681 Unsteadiness on feet: Secondary | ICD-10-CM

## 2017-02-19 DIAGNOSIS — R2689 Other abnormalities of gait and mobility: Secondary | ICD-10-CM

## 2017-02-19 DIAGNOSIS — IMO0002 Reserved for concepts with insufficient information to code with codable children: Secondary | ICD-10-CM

## 2017-02-19 LAB — POCT GLYCOSYLATED HEMOGLOBIN (HGB A1C): Hemoglobin A1C: 7.5

## 2017-02-19 MED ORDER — INSULIN GLARGINE 100 UNIT/ML SOLOSTAR PEN: 200.0000 [IU] | PEN_INJECTOR | SUBCUTANEOUS | 11 refills | Status: DC

## 2017-02-19 NOTE — Patient Instructions (Addendum)
check your blood sugar 4 times a day.  vary the time of day when you check, between before the 3 meals, and at bedtime.  also check if you have symptoms of your blood sugar being too high or too low.  please keep a record of the readings and bring it to your next appointment here.  You can write it on any piece of paper.  please call us sooner if your blood sugar goes below 70, or if you have a lot of readings over 200.   Please continue the same insulin.   please come back for a follow-up appointment in 3 months.

## 2017-02-19 NOTE — Progress Notes (Signed)
Subjective:    Patient ID: Martin [name], male    DOB: [birth year], 71 y.o.   MRN: [id number]  HPI Pt returns for f/u of diabetes mellitus: DM type: Insulin-requiring type 2.  Dx'ed: 8588 Complications: polyneuropathy, foot ulcer, renal insuff, and retinopathy.  Therapy: insulin since 2013.  DKA: never.   Severe hypoglycemia: never.  Pancreatitis: never.   Other: he chose to change to a qd insulin regimen, after poor results with multiple daily injections; he declines weight loss surgery.   Interval history: Pt says he increased lantus to 200 units qam, due to increased cbg's.  He feels this is due to recent C Diff infection.  no cbg record, but states cbg's are in the mid-100's.  Past Medical History:  Diagnosis Date  . Diabetes mellitus   . Hypertension     Past Surgical History:  Procedure Laterality Date  . HERNIA REPAIR     >10 years ago  . IR RADIOLOGIST EVAL & MGMT  12/24/2016  . right shoulder surgery     around 2014    Social History   Social History  . Marital status: Married    Spouse name: N/A  . Number of children: N/A  . Years of education: N/A   Occupational History  . Not on file.   Social History Main Topics  . Smoking status: Former Smoker    Quit date: 04/29/2000  . Smokeless tobacco: Never Used     Comment: smoked for 10 years on and off  . Alcohol use 0.0 oz/week     Comment: 6 martinis a year  . Drug use: No  . Sexual activity: Yes   Other Topics Concern  . Not on file   Social History Narrative   Married. 3 kids. 7 grandkids. No greatgrandkids.       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 amitriptyline (ELAVIL) 10 MG tablet Take 1 tablet (10 mg total) by mouth at bedtime. (Patient not taking: Reported on 02/19/2017) 30 tablet 5  . BESIVANCE 0.6 % SUSP Place 1 drop into  the left eye 3 (three) times daily.  1  . glucose blood (ONETOUCH VERIO) test strip 1 each by Other route 4 (four) times daily. And lancets 4/day 120 each 12  . Insulin Pen Needle (NOVOFINE) 32G X 6 MM MISC USE 5 TIMES DAILY 300 each 3  . valsartan-hydrochlorothiazide (DIOVAN-HCT) 320-25 MG tablet TAKE 1 TABLET BY MOUTH DAILY. 30 tablet 3   No current facility-administered medications on file prior to visit.     Allergies  Allergen Reactions  . Simvastatin Diarrhea    Family History  Problem Relation Age of Onset  . Hypertension Mother   . Heart disease Mother        CHF did not see doctor  . Diabetes Maternal Grandmother   . Diabetes Son     BP (!) 148/90   Pulse 85   Wt (!) 303 lb 9.6 oz (137.7 kg)   SpO2 97%   BMI 41.18 kg/m    Review of Systems He denies hypoglycemia.  He has gained weight.     Objective:   Physical Exam VITAL SIGNS: See vs page.    GENERAL: no distress.   Pulses: dorsalis pedis intact bilat. MSK: no deformity of the feet.  CV: 2+ bilat  leg edema.  Skin: left foot ulcer is healed. left great toe is bandaged (sees wound care).  normal color and temp on the feet.  Neuro: sensation is intact to touch on the feet, but severely decreased from normal.    Lab Results  Component Value Date   HGBA1C 7.5 02/19/2017       Assessment & Plan:  Insulin-requiring type 2 DM, with DR: this is the best control this pt should aim for, given this regimen, which does match insulin to his changing needs throughout the day  Patient Instructions  check your blood sugar 4 times a day.  vary the time of day when you check, between before the 3 meals, and at bedtime.  also check if you have symptoms of your blood sugar being too high or too low.  please keep a record of the readings and bring it to your next appointment here.  You can write it on any piece of paper.  please call us sooner if your blood sugar goes below 70, or if you have a lot of readings over 200.    Please continue the same insulin.   please come back for a follow-up appointment in 3 months.

## 2017-02-19 NOTE — Therapy (Signed)
[facility] [address] [place], [place], [address] Phone: [phone]   Fax:  [phone]  Physical Therapy Evaluation  Patient Details  Name: Martin [name] MRN: [id number] Date of Birth: 12/24/1945 Referring Provider: Dr. [name]  Encounter Date: 02/19/2017      PT End of Session - 02/19/17 1309    Visit Number 1   Number of Visits 16   Date for PT Re-Evaluation 04/16/17   Authorization Type HTA - $15 copay   PT Start Time 1010   PT Stop Time 1058   PT Time Calculation (min) 48 min   Activity Tolerance Patient tolerated treatment well   Behavior During Therapy [facility] for tasks assessed/performed      Past Medical History:  Diagnosis Date  . Diabetes mellitus   . Hypertension     Past Surgical History:  Procedure Laterality Date  . HERNIA REPAIR     >10 years ago  . IR RADIOLOGIST EVAL & MGMT  12/24/2016  . right shoulder surgery     around 2014    There were no vitals filed for this visit.       Subjective Assessment - 02/19/17 1014    Subjective Pt is a 71 y/o male who presesnts to OPPT with many year hx of Lt foot issues.  Pt states multiple infections on Lt foot, and whlie undergoing treatment for Lt foot, injured Rt foot stepping up on lawnmower (felt a pop).  Two weeks following, pt then felt another "pop." Pt followed up with orthopedic surgeon, and dx with partially torn achilles tendon which is non-operative at this time.  Pt presents today with decreased balance and instablity, and c/o worsening neuropathy.     Patient is accompained by: Family member   Pertinent History DM with neuropathy   Limitations Walking   Patient Stated Goals improve balance   Currently in Pain? No/denies            [facility] PT Assessment - 02/19/17 1023      Assessment   Medical Diagnosis Rt partial achilles tear; decr balance   Referring Provider Dr. [name]   Onset Date/Surgical Date --  August   Next MD  Visit 05/15/16   Prior Therapy n/a     Precautions   Precautions Fall     Restrictions   Weight Bearing Restrictions No     Balance Screen   Has the patient fallen in the past 6 months No   Has the patient had a decrease in activity level because of a fear of falling?  Yes   Is the patient reluctant to leave their home because of a fear of falling?  No     Home Environment   Living Environment Private residence   Living Arrangements Spouse/significant other   Type of [place] to enter   Entrance Stairs-Number of Steps 5   [place] to live on main level with bedroom/bathroom;Two level   Home Equipment --  walking stick   Additional Comments step-to pattern with stairs     Prior Function   Level of Independence Independent   Vocation Retired   [employer] retired Patent attorney, eating out, watching TV     Cognition   Overall Cognitive Status Within Functional Limits for tasks assessed     Sensation   Light Touch Impaired Detail     ROM / Strength  AROM / PROM / Strength AROM;Strength     AROM   AROM Assessment Site Ankle   Right/Left Ankle Right;Left   Right Ankle Dorsiflexion 1   Right Ankle Plantar Flexion 34   Right Ankle Inversion 5   Right Ankle Eversion 4   Left Ankle Dorsiflexion 0   Left Ankle Plantar Flexion 41   Left Ankle Inversion 12   Left Ankle Eversion 15     Strength   Strength Assessment Site Ankle   Right/Left Ankle Right;Left   Right Ankle Dorsiflexion 4/5   Right Ankle Plantar Flexion 2/5   Right Ankle Inversion 2/5   Right Ankle Eversion 3+/5   Left Ankle Dorsiflexion 4/5   Left Ankle Plantar Flexion 4/5   Left Ankle Inversion 4/5   Left Ankle Eversion 4/5     Ambulation/Gait   Ambulation/Gait Yes   Ambulation/Gait Assistance 6: Modified independent (Device/Increase time)   Ambulation Distance (Feet) 100 Feet   Assistive device --  walking  stick   Gait Pattern Decreased stance time - right;Decreased step length - left;Decreased dorsiflexion - right  decreased Rt push off   Ambulation Surface Level;Indoor   Gait velocity 2.6 ft/sec  71m: 12.59 sec     Standardized Balance Assessment   Standardized Balance Assessment Berg Balance Test     Berg Balance Test   Sit to Stand Able to stand without using hands and stabilize independently   Standing Unsupported Able to stand safely 2 minutes   Sitting with Back Unsupported but Feet Supported on Floor or Stool Able to sit safely and securely 2 minutes   Stand to Sit Controls descent by using hands   Transfers Able to transfer safely, definite need of hands   Standing Unsupported with Eyes Closed Able to stand 10 seconds with supervision   Standing Ubsupported with Feet Together Able to place feet together independently and stand for 1 minute with supervision   From Standing, Reach Forward with Outstretched Arm Can reach confidently >25 cm (10")   From Standing Position, Pick up Object from Floor Able to pick up shoe, needs supervision   From Standing Position, Turn to Look Behind Over each Shoulder Needs supervision when turning   Turn 360 Degrees Able to turn 360 degrees safely but slowly   Standing Unsupported, Alternately Place Feet on Step/Stool Able to complete 4 steps without aid or supervision   Standing Unsupported, One Foot in Front Able to take small step independently and hold 30 seconds   Standing on One Leg Tries to lift leg/unable to hold 3 seconds but remains standing independently   Total Score 39            Objective measurements completed on examination: See above findings.                  PT Education - 02/19/17 1309    Education provided Yes   Education Details clinical findings, POC, goals of care   Person(s) Educated Spouse;Patient   Methods Explanation   Comprehension Verbalized understanding          PT Short Term Goals -  02/19/17 1319      PT SHORT TERM GOAL #1   Title verbalize understanding of fall prevention strategies to decrease risk of reinjury   Status New   Target Date 03/19/17     PT SHORT TERM GOAL #2   Title improve BERG balance score to >/= 45/56 for decreased fall risk   Status New  Target Date 03/19/17     PT SHORT TERM GOAL #3   Title amb > 250' modified independent on various indoor/outdoor surfaces for improved mobility   Status New   Target Date 03/19/17           PT Long Term Goals - 05-Mar-2017 1320      PT LONG TERM GOAL #1   Title independent with HEP   Status New   Target Date 04/16/17     PT LONG TERM GOAL #2   Title improve gait velocity to > 3.0 ft/sec for improved function   Status New   Target Date 04/16/17     PT LONG TERM GOAL #3   Title amb > 400' on various indoor/outdoor surfaces modified independent including ramp/curb for improved community access   Status New   Target Date 04/16/17     PT LONG TERM GOAL #4   Title improve Rt ankle strength to at least 4/5 for improved function and stability   Status New   Target Date 04/16/17                Plan - Mar 05, 2017 1310    Clinical Impression Statement Pt is a 71 y/o male who presents to OPPT for decreased pain and mobility following partial Rt achilles tear.  Pt demonstrates decreased Rt ankle ROM and decreased bil ankle strength, as well as balance deficits and gait abnormalities affecting functional mobility.  Symptoms also complicated by diabetic neuropathy and current healing wound on Lt foot.  Pt will benefit from PT to address deficits listed.   History and Personal Factors relevant to plan of care: DM with neuropathy; partial achilles tear treated conservatively, healing Lt foot wound   Clinical Presentation Evolving   Clinical Decision Making Moderate   Rehab Potential Good   PT Frequency 2x / week   PT Duration 8 weeks   PT Treatment/Interventions ADLs/Self Care Home  Management;Cryotherapy;Electrical Stimulation;Ultrasound;Moist Heat;Neuromuscular re-education;Balance training;Therapeutic exercise;Therapeutic activities;Functional mobility training;Stair training;Gait training;Patient/family education;Manual techniques;Passive range of motion;Vasopneumatic Device;Vestibular;Taping   PT Next Visit Plan establish HEP for ankle ROM/strength, corner balance exercises   Consulted and Agree with Plan of Care Patient;Family member/caregiver   Family Member Consulted wife      Patient will benefit from skilled therapeutic intervention in order to improve the following deficits and impairments:  Abnormal gait, Decreased balance, Decreased mobility, Difficulty walking, Decreased strength, Decreased range of motion, Decreased knowledge of use of DME, Decreased skin integrity, Increased edema  Visit Diagnosis: Unsteadiness on feet - Plan: PT plan of care cert/re-cert  Stiffness of right ankle, not elsewhere classified - Plan: PT plan of care cert/re-cert  Other abnormalities of gait and mobility - Plan: PT plan of care cert/re-cert      [facility] PT PB G-CODES - 03-05-2017 1323    Functional Assessment Tool Used  BERG 39/56   Functional Limitations Mobility: Walking and moving around   Mobility: Walking and Moving Around Current Status At least 20 percent but less than 40 percent impaired, limited or restricted   Mobility: Walking and Moving Around Goal Status [phone]) At least 1 percent but less than 20 percent impaired, limited or restricted       Problem List Patient Active Problem List   Diagnosis Date Noted  . Partial tear of right Achilles tendon 02/12/2017  . Aortic atherosclerosis ([place]) 10/07/2016  . C. difficile colitis 09/25/2016  . Aspiration pneumonia ([place]) 09/25/2016  . AKI (acute kidney injury) ([place]) 09/18/2016  . History of  skin cancer 08/15/2015  . Undiagnosed cardiac murmurs 08/15/2015  . Diabetic retinopathy ([place]) 02/21/2015  . Former smoker  08/24/2014  . Hyperlipidemia 08/24/2014  . Ingrowing toenail 07/19/2014  . Morbid obesity ([place]) 04/12/2014  . Diabetic polyneuropathy ([place]) 04/11/2009  . BACK PAIN, LUMBAR, WITH RADICULOPATHY 08/30/2008  . DM (diabetes mellitus) type II uncontrolled with eye manifestation ([place]) 02/03/2007  . Essential hypertension 02/03/2007      Laureen Abrahams, PT, DPT 02/19/17 1:25 PM     [facility] [place], [place], [address] Phone: [phone]   Fax:  [phone]  Name: KAIRYN OLMEDA MRN: [id number] Date of Birth: 29-Aug-1945

## 2017-02-25 ENCOUNTER — Ambulatory Visit (INDEPENDENT_AMBULATORY_CARE_PROVIDER_SITE_OTHER): Payer: PPO | Admitting: Physical Therapy

## 2017-02-25 DIAGNOSIS — M25671 Stiffness of right ankle, not elsewhere classified: Secondary | ICD-10-CM

## 2017-02-25 DIAGNOSIS — R2681 Unsteadiness on feet: Secondary | ICD-10-CM | POA: Diagnosis not present

## 2017-02-25 DIAGNOSIS — R2689 Other abnormalities of gait and mobility: Secondary | ICD-10-CM | POA: Diagnosis not present

## 2017-02-25 NOTE — Patient Instructions (Signed)
  ROM: Plantar / Dorsiflexion   With right leg relaxed, gently flex and extend ankle. Move through full range of motion. Avoid pain. Repeat __20__ times per set. Do _1___ sets per session. Do __2-3__ sessions per day.   Ankle Alphabet   Using right ankle and foot only, trace the letters of the alphabet. Perform A to Z. Repeat _1-2___ times per set. Do __1__ sets per session. Do __2-3__ sessions per day.   Ankle Circles   Slowly rotate right foot and ankle clockwise then counterclockwise. Gradually increase range of motion. Avoid pain. Circle __20__ times each direction per set. Do __1__ sets per session. Do __2-3__ sessions per day.   Feet Apart (Compliant Surface) Varied Arm Positions - Eyes Closed    Stand on compliant surface: _pillow or cushion___ with feet shoulder width apart and arms out. Close eyes and try to stand still. Hold__10-15__ seconds. Repeat __5__ times per session. Do __2-3__ sessions per day.   Feet Together (Compliant Surface) Head Motion - Eyes Open    With eyes open, standing on compliant surface: _pillow or cushion__, feet together, move head slowly: up and down 10 times each way and side to side 10 times each way. Repeat __1-2__ times per session. Do _2-3___ sessions per day.   Feet Apart (Compliant Surface) Head Motion - Eyes Closed    Stand on compliant surface: __pillow or cushion______ with feet shoulder width apart. Close eyes and move head slowly, up and down 10 times each way and side to side 10 times each way. Repeat __1-2__ times per session. Do __2-3__ sessions per day.   Single Leg - Eyes Open    Holding support, lift left leg while maintaining balance over other leg. Progress to removing hands from support surface for longer periods of time. Hold__10-15__ seconds.  Repeat with other leg. Repeat __5__ times per session. Do __2-3__ sessions per day.

## 2017-02-25 NOTE — Therapy (Signed)
[facility] [address] [place], [place], [address] Phone: [phone]   Fax:  [phone]  Physical Therapy Treatment  Patient Details  Name: Martin [name] MRN: [id number] Date of Birth: [birth year] Referring Provider: Dr. [name]  Encounter Date: 02/25/2017      PT End of Session - 02/25/17 1342    Visit Number 2   Number of Visits 16   Date for PT Re-Evaluation 04/16/17   Authorization Type HTA - $15 copay   PT Start Time 1300   PT Stop Time 1341   PT Time Calculation (min) 41 min   Activity Tolerance Patient tolerated treatment well   Behavior During Therapy [facility] for tasks assessed/performed      Past Medical History:  Diagnosis Date  . Diabetes mellitus   . Hypertension     Past Surgical History:  Procedure Laterality Date  . HERNIA REPAIR     >10 years ago  . IR RADIOLOGIST EVAL & MGMT  12/24/2016  . right shoulder surgery     around 2014    There were no vitals filed for this visit.      Subjective Assessment - 02/25/17 1259    Subjective had a headache this morning but now resolved.  no falls; trying to walk some without the cane.  balance seems better overall.   Currently in Pain? No/denies                         [facility] Adult PT Treatment/Exercise - 02/25/17 1308      Exercises   Exercises Ankle     Ankle Exercises: Standing   SLS 5 x 10-15 sec bil with intermittent UE support     Ankle Exercises: Seated   ABC's 1 rep   Ankle Circles/Pumps Right;20 reps             Balance Exercises - 02/25/17 1340      Balance Exercises: Standing   Standing Eyes Opened Foam/compliant surface;Narrow base of support (BOS);Head turns   Standing Eyes Closed Foam/compliant surface;Narrow base of support (BOS);5 reps;10 secs;Wide (BOA);Head turns           PT Education - 02/25/17 1342    Education provided Yes   Education Details HEP   Person(s) Educated Patient;Spouse    Methods Explanation;Demonstration;Handout   Comprehension Verbalized understanding;Returned demonstration;Need further instruction          PT Short Term Goals - 02/19/17 1319      PT SHORT TERM GOAL #1   Title verbalize understanding of fall prevention strategies to decrease risk of reinjury   Status New   Target Date 03/19/17     PT SHORT TERM GOAL #2   Title improve BERG balance score to >/= 45/56 for decreased fall risk   Status New   Target Date 03/19/17     PT SHORT TERM GOAL #3   Title amb > 250' modified independent on various indoor/outdoor surfaces for improved mobility   Status New   Target Date 03/19/17           PT Long Term Goals - 02/19/17 1320      PT LONG TERM GOAL #1   Title independent with HEP   Status New   Target Date 04/16/17     PT LONG TERM GOAL #2   Title improve gait velocity to > 3.0 ft/sec for improved function   Status New   Target Date 04/16/17  PT LONG TERM GOAL #3   Title amb > 400' on various indoor/outdoor surfaces modified independent including ramp/curb for improved community access   Status New   Target Date 04/16/17     PT LONG TERM GOAL #4   Title improve Rt ankle strength to at least 4/5 for improved function and stability   Status New   Target Date 04/16/17               Plan - 02/25/17 1342    Clinical Impression Statement Session today focued on initiating HEP for ankle strengthening and balance exercises.  Tolerated all exercises well with increased difficulty with EC on compliant surface.  Will continue to benefit from PT to maximize function.   PT Treatment/Interventions ADLs/Self Care Home Management;Cryotherapy;Electrical Stimulation;Ultrasound;Moist Heat;Neuromuscular re-education;Balance training;Therapeutic exercise;Therapeutic activities;Functional mobility training;Stair training;Gait training;Patient/family education;Manual techniques;Passive range of motion;Vasopneumatic Device;Vestibular;Taping    PT Next Visit Plan review HEP, continue balance and ankle exercises   Consulted and Agree with Plan of Care Patient;Family member/caregiver   Family Member Consulted wife      Patient will benefit from skilled therapeutic intervention in order to improve the following deficits and impairments:  Abnormal gait, Decreased balance, Decreased mobility, Difficulty walking, Decreased strength, Decreased range of motion, Decreased knowledge of use of DME, Decreased skin integrity, Increased edema  Visit Diagnosis: Unsteadiness on feet  Stiffness of right ankle, not elsewhere classified  Other abnormalities of gait and mobility     Problem List Patient Active Problem List   Diagnosis Date Noted  . Partial tear of right Achilles tendon 02/12/2017  . Aortic atherosclerosis ([place]) 10/07/2016  . C. difficile colitis 09/25/2016  . Aspiration pneumonia ([place]) 09/25/2016  . AKI (acute kidney injury) ([place]) 09/18/2016  . History of skin cancer 08/15/2015  . Undiagnosed cardiac murmurs 08/15/2015  . Diabetic retinopathy ([place]) 02/21/2015  . Former smoker 08/24/2014  . Hyperlipidemia 08/24/2014  . Ingrowing toenail 07/19/2014  . Morbid obesity ([place]) 04/12/2014  . Diabetic polyneuropathy ([place]) 04/11/2009  . BACK PAIN, LUMBAR, WITH RADICULOPATHY 08/30/2008  . DM (diabetes mellitus) type II uncontrolled with eye manifestation ([place]) 02/03/2007  . Essential hypertension 02/03/2007      Laureen Abrahams, PT, DPT 02/25/17 1:44 PM    [place], [place], [address] Phone: [phone]   Fax:  [phone]  Name: Martin [name] MRN: [id number] Date of Birth: October 04, 1945

## 2017-02-26 ENCOUNTER — Encounter (INDEPENDENT_AMBULATORY_CARE_PROVIDER_SITE_OTHER): Payer: PPO | Admitting: Ophthalmology

## 2017-02-26 DIAGNOSIS — E113312 Type 2 diabetes mellitus with moderate nonproliferative diabetic retinopathy with macular edema, left eye: Secondary | ICD-10-CM | POA: Diagnosis not present

## 2017-02-26 DIAGNOSIS — H35033 Hypertensive retinopathy, bilateral: Secondary | ICD-10-CM

## 2017-02-26 DIAGNOSIS — H43813 Vitreous degeneration, bilateral: Secondary | ICD-10-CM

## 2017-02-26 DIAGNOSIS — E11311 Type 2 diabetes mellitus with unspecified diabetic retinopathy with macular edema: Secondary | ICD-10-CM | POA: Diagnosis not present

## 2017-02-26 DIAGNOSIS — E113211 Type 2 diabetes mellitus with mild nonproliferative diabetic retinopathy with macular edema, right eye: Secondary | ICD-10-CM

## 2017-02-26 DIAGNOSIS — I10 Essential (primary) hypertension: Secondary | ICD-10-CM | POA: Diagnosis not present

## 2017-02-26 LAB — HM DIABETES EYE EXAM

## 2017-02-27 ENCOUNTER — Ambulatory Visit (INDEPENDENT_AMBULATORY_CARE_PROVIDER_SITE_OTHER): Payer: PPO | Admitting: Physical Therapy

## 2017-02-27 DIAGNOSIS — R2681 Unsteadiness on feet: Secondary | ICD-10-CM

## 2017-02-27 DIAGNOSIS — R2689 Other abnormalities of gait and mobility: Secondary | ICD-10-CM

## 2017-02-27 DIAGNOSIS — M25671 Stiffness of right ankle, not elsewhere classified: Secondary | ICD-10-CM

## 2017-02-27 NOTE — Therapy (Signed)
[facility] [address] [place], [place], [address] Phone: [phone]   Fax:  [phone]  Physical Therapy Treatment  Patient Details  Name: Martin [name] MRN: [id number] Date of Birth: 11/25/1945 Referring Provider: Dr. [name]  Encounter Date: 02/27/2017      PT End of Session - 02/27/17 0840    Visit Number 3   Number of Visits 16   Date for PT Re-Evaluation 04/16/17   Authorization Type HTA - $15 copay   PT Start Time 0800   PT Stop Time 0840   PT Time Calculation (min) 40 min   Activity Tolerance Patient tolerated treatment well   Behavior During Therapy [facility] for tasks assessed/performed      Past Medical History:  Diagnosis Date  . Diabetes mellitus   . Hypertension     Past Surgical History:  Procedure Laterality Date  . HERNIA REPAIR     >10 years ago  . IR RADIOLOGIST EVAL & MGMT  12/24/2016  . right shoulder surgery     around 2014    There were no vitals filed for this visit.      Subjective Assessment - 02/27/17 0759    Subjective only did ankle exercises yesterday; had eye appt yesterday and it was too painful to keep eyes open.   Patient is accompained by: Family member   Pertinent History DM with neuropathy   Patient Stated Goals improve balance   Currently in Pain? No/denies                         [facility] Adult PT Treatment/Exercise - 02/27/17 0801      Ankle Exercises: Seated   ABC's 1 rep   Ankle Circles/Pumps 20 reps;Both   Heel Raises 20 reps;3 seconds   Toe Raise 20 reps;3 seconds     Ankle Exercises: Standing   SLS 5 x 10-15 sec bil with intermittent UE support             Balance Exercises - 02/27/17 0822      Balance Exercises: Standing   Standing Eyes Opened Foam/compliant surface;Narrow base of support (BOS);Head turns   Standing Eyes Closed Foam/compliant surface;Narrow base of support (BOS);5 reps;10 secs;Wide (BOA);Head turns              PT Short Term Goals - 02/19/17 1319      PT SHORT TERM GOAL #1   Title verbalize understanding of fall prevention strategies to decrease risk of reinjury   Status New   Target Date 03/19/17     PT SHORT TERM GOAL #2   Title improve BERG balance score to >/= 45/56 for decreased fall risk   Status New   Target Date 03/19/17     PT SHORT TERM GOAL #3   Title amb > 250' modified independent on various indoor/outdoor surfaces for improved mobility   Status New   Target Date 03/19/17           PT Long Term Goals - 02/19/17 1320      PT LONG TERM GOAL #1   Title independent with HEP   Status New   Target Date 04/16/17     PT LONG TERM GOAL #2   Title improve gait velocity to > 3.0 ft/sec for improved function   Status New   Target Date 04/16/17     PT LONG TERM GOAL #3   Title amb > 400' on various indoor/outdoor surfaces  modified independent including ramp/curb for improved community access   Status New   Target Date 04/16/17     PT LONG TERM GOAL #4   Title improve Rt ankle strength to at least 4/5 for improved function and stability   Status New   Target Date 04/16/17               Plan - 02/27/17 0841    Clinical Impression Statement Session today included review of HEP as pt did not perform in entirety since last session, needing min cues for technique.  Will continue to benefit from PT to maximize function.   PT Next Visit Plan continue balance and ankle exercises   Consulted and Agree with Plan of Care Patient;Family member/caregiver   Family Member Consulted wife      Patient will benefit from skilled therapeutic intervention in order to improve the following deficits and impairments:  Abnormal gait, Decreased balance, Decreased mobility, Difficulty walking, Decreased strength, Decreased range of motion, Decreased knowledge of use of DME, Decreased skin integrity, Increased edema  Visit Diagnosis: Unsteadiness on feet  Stiffness of right ankle,  not elsewhere classified  Other abnormalities of gait and mobility     Problem List Patient Active Problem List   Diagnosis Date Noted  . Partial tear of right Achilles tendon 02/12/2017  . Aortic atherosclerosis ([place]) 10/07/2016  . C. difficile colitis 09/25/2016  . Aspiration pneumonia ([place]) 09/25/2016  . AKI (acute kidney injury) ([place]) 09/18/2016  . History of skin cancer 08/15/2015  . Undiagnosed cardiac murmurs 08/15/2015  . Diabetic retinopathy ([place]) 02/21/2015  . Former smoker 08/24/2014  . Hyperlipidemia 08/24/2014  . Ingrowing toenail 07/19/2014  . Morbid obesity ([place]) 04/12/2014  . Diabetic polyneuropathy ([place]) 04/11/2009  . BACK PAIN, LUMBAR, WITH RADICULOPATHY 08/30/2008  . DM (diabetes mellitus) type II uncontrolled with eye manifestation ([place]) 02/03/2007  . Essential hypertension 02/03/2007      Laureen Abrahams, PT, DPT 02/27/17 8:43 AM    [place], [place], [address] Phone: [phone]   Fax:  [phone]  Name: JEHU MCCAUSLIN MRN: [id number] Date of Birth: 1946/03/15

## 2017-03-04 ENCOUNTER — Ambulatory Visit: Payer: PPO | Admitting: Physical Therapy

## 2017-03-04 ENCOUNTER — Encounter: Payer: Self-pay | Admitting: Physical Therapy

## 2017-03-04 DIAGNOSIS — R2681 Unsteadiness on feet: Secondary | ICD-10-CM

## 2017-03-04 DIAGNOSIS — R2689 Other abnormalities of gait and mobility: Secondary | ICD-10-CM

## 2017-03-04 DIAGNOSIS — M25671 Stiffness of right ankle, not elsewhere classified: Secondary | ICD-10-CM | POA: Diagnosis not present

## 2017-03-04 NOTE — Therapy (Signed)
[facility] [address] [place], [place], [address] Phone: [phone]   Fax:  [phone]  Physical Therapy Treatment  Patient Details  Name: Martin [name] MRN: [id number] Date of Birth: 30-Jan-1946 Referring Provider: Dr. [name]   Encounter Date: 03/04/2017  PT End of Session - 03/04/17 1357    Visit Number  4    Number of Visits  16    Date for PT Re-Evaluation  04/16/17    Authorization Type  HTA - $15 copay    PT Start Time  1300    PT Stop Time  1344    PT Time Calculation (min)  44 min    Activity Tolerance  Patient tolerated treatment well    Behavior During Therapy  [facility] for tasks assessed/performed       Past Medical History:  Diagnosis Date  . Diabetes mellitus   . Hypertension     Past Surgical History:  Procedure Laterality Date  . HERNIA REPAIR     >10 years ago  . IR RADIOLOGIST EVAL & MGMT  12/24/2016  . right shoulder surgery     around 2014    There were no vitals filed for this visit.  Subjective Assessment - 03/04/17 1258    Subjective  exercises "hurt my feet."  c/o soreness rather than pain.  concerned about possible blisters forming on feet.    Patient Stated Goals  improve balance    Currently in Pain?  No/denies                      [facility] Adult PT Treatment/Exercise - 03/04/17 1302      Exercises   Exercises  Knee/Hip      Knee/Hip Exercises: Standing   Heel Raises  Both partial ROM; 8 reps only   partial ROM; 8 reps only   Heel Raises Limitations  toe raises x 15 reps    Hip Flexion  Both;15 reps;Knee bent    Hip Flexion Limitations  alternating with UE support    Hip Abduction  Both;15 reps;Knee straight    Abduction Limitations  alternating with UE support    Hip Extension  Both;15 reps;Knee straight    Extension Limitations  alternating with UE support      Ankle Exercises: Seated   Other Seated Ankle Exercises  ankle 4-way with red theraband x 15 rep bil              PT Education - 03/04/17 1356    Education provided  Yes    Education Details  theraband HEP    Person(s) Educated  Patient;Spouse    Methods  Explanation;Demonstration;Handout    Comprehension  Verbalized understanding;Returned demonstration;Need further instruction       PT Short Term Goals - 02/19/17 1319      PT SHORT TERM GOAL #1   Title  verbalize understanding of fall prevention strategies to decrease risk of reinjury    Status  New    Target Date  03/19/17      PT SHORT TERM GOAL #2   Title  improve BERG balance score to >/= 45/56 for decreased fall risk    Status  New    Target Date  03/19/17      PT SHORT TERM GOAL #3   Title  amb > 250' modified independent on various indoor/outdoor surfaces for improved mobility    Status  New    Target Date  03/19/17  PT Long Term Goals - 02/19/17 1320      PT LONG TERM GOAL #1   Title  independent with HEP    Status  New    Target Date  04/16/17      PT LONG TERM GOAL #2   Title  improve gait velocity to > 3.0 ft/sec for improved function    Status  New    Target Date  04/16/17      PT LONG TERM GOAL #3   Title  amb > 400' on various indoor/outdoor surfaces modified independent including ramp/curb for improved community access    Status  New    Target Date  04/16/17      PT LONG TERM GOAL #4   Title  improve Rt ankle strength to at least 4/5 for improved function and stability    Status  New    Target Date  04/16/17            Plan - 03/04/17 1358    Clinical Impression Statement  Pt fatigues quickly with standing exercises needing seated rest breaks every couple of exercises.  Pt tolerated strengthening and balance exercises well with min cues for technique.  Will continue to benefit from PT to maximize function.    PT Treatment/Interventions  ADLs/Self Care Home Management;Cryotherapy;Electrical Stimulation;Ultrasound;Moist Heat;Neuromuscular re-education;Balance  training;Therapeutic exercise;Therapeutic activities;Functional mobility training;Stair training;Gait training;Patient/family education;Manual techniques;Passive range of motion;Vasopneumatic Device;Vestibular;Taping    PT Next Visit Plan  continue balance and ankle exercises    Consulted and Agree with Plan of Care  Patient;Family member/caregiver    Family Member Consulted  wife       Patient will benefit from skilled therapeutic intervention in order to improve the following deficits and impairments:  Abnormal gait, Decreased balance, Decreased mobility, Difficulty walking, Decreased strength, Decreased range of motion, Decreased knowledge of use of DME, Decreased skin integrity, Increased edema  Visit Diagnosis: Unsteadiness on feet  Stiffness of right ankle, not elsewhere classified  Other abnormalities of gait and mobility     Problem List Patient Active Problem List   Diagnosis Date Noted  . Partial tear of right Achilles tendon 02/12/2017  . Aortic atherosclerosis ([place]) 10/07/2016  . C. difficile colitis 09/25/2016  . Aspiration pneumonia ([place]) 09/25/2016  . AKI (acute kidney injury) ([place]) 09/18/2016  . History of skin cancer 08/15/2015  . Undiagnosed cardiac murmurs 08/15/2015  . Diabetic retinopathy ([place]) 02/21/2015  . Former smoker 08/24/2014  . Hyperlipidemia 08/24/2014  . Ingrowing toenail 07/19/2014  . Morbid obesity ([place]) 04/12/2014  . Diabetic polyneuropathy ([place]) 04/11/2009  . BACK PAIN, LUMBAR, WITH RADICULOPATHY 08/30/2008  . DM (diabetes mellitus) type II uncontrolled with eye manifestation ([place]) 02/03/2007  . Essential hypertension 02/03/2007      Laureen Abrahams, PT, DPT 03/04/17 2:03 PM    [facility] [address] [place], [place], [address] Phone: [phone]   Fax:  [phone]  Name: Martin [name] MRN: [id number] Date of Birth: July 24, 1945

## 2017-03-04 NOTE — Patient Instructions (Signed)
ANKLE: Dorsiflexion (Band)    Sit at edge of surface. Place band around top of foot. Keeping heel on floor, raise toes of banded foot. Hold _1-2__ seconds. Use __red___ band. _15__ reps per set, _1-2__ sets per day, _6-7__ days per week.  Repeat with other leg.  ANKLE: Eversion, Unilateral (Band)    Place band around left foot. Keeping heel in place, raise toes of banded foot up and away from body. Do not move hip. Hold __1-2_ seconds. Use __red___ band. _15__ reps per set, _1-2__ sets per day, _6-7__ days per week.  Repeat with other leg.   ANKLE: Inversion, Unilateral (Band)    Placing band around left foot. Keeping heel in place, lift toes of banded foot up and in.  Anchor band with other foot crossed behind leg. Do not move hip. Hold _1-2__ seconds. Use _red__ band. _15__ reps per set, __1-2_ sets per day, _6-7__ days per week.  Repeat with other leg.    ANKLE: Plantarflexion - Sitting (Band)    Place band around foot; hold other end. Sit at edge of sitting surface. Keep heel in place. Push foot down against band. Hold _1-2__ seconds. Use __red__ band. _15__ reps per set, __1-2_ sets per day, _6-7__ days per week.  Repeat with other leg.

## 2017-03-05 ENCOUNTER — Encounter: Payer: Self-pay | Admitting: Physical Therapy

## 2017-03-05 ENCOUNTER — Other Ambulatory Visit: Payer: Self-pay

## 2017-03-05 ENCOUNTER — Ambulatory Visit: Payer: PPO | Admitting: Physical Therapy

## 2017-03-05 DIAGNOSIS — M25671 Stiffness of right ankle, not elsewhere classified: Secondary | ICD-10-CM | POA: Diagnosis not present

## 2017-03-05 DIAGNOSIS — R2689 Other abnormalities of gait and mobility: Secondary | ICD-10-CM | POA: Diagnosis not present

## 2017-03-05 DIAGNOSIS — R2681 Unsteadiness on feet: Secondary | ICD-10-CM

## 2017-03-05 NOTE — Therapy (Signed)
[facility] [address] [place], [place], [address] Phone: [phone]   Fax:  [phone]  Physical Therapy Treatment  Patient Details  Name: Martin [name] MRN: [id number] Date of Birth: 04/28/1946 Referring Provider: Dr. [name]   Encounter Date: 03/05/2017  PT End of Session - 03/05/17 0921    Visit Number  5    Number of Visits  16    Date for PT Re-Evaluation  04/16/17    Authorization Type  HTA - $15 copay    PT Start Time  0846    PT Stop Time  0925    PT Time Calculation (min)  39 min    Activity Tolerance  Patient tolerated treatment well    Behavior During Therapy  [facility] for tasks assessed/performed       Past Medical History:  Diagnosis Date  . Diabetes mellitus   . Hypertension     Past Surgical History:  Procedure Laterality Date  . HERNIA REPAIR     >10 years ago  . IR RADIOLOGIST EVAL & MGMT  12/24/2016  . right shoulder surgery     around 2014    There were no vitals filed for this visit.  Subjective Assessment - 03/05/17 0852    Subjective  doing well; tired after last session but not too bad    Patient Stated Goals  improve balance    Currently in Pain?  No/denies                      [facility] Adult PT Treatment/Exercise - 03/05/17 0853      Knee/Hip Exercises: Stretches   Gastroc Stretch  Both;3 reps;30 seconds seated with strap   seated with strap     Knee/Hip Exercises: Aerobic   Stationary Bike  L1 x 5 min      Ankle Exercises: Seated   Heel Raises  20 reps;3 seconds with overpressure on knees   with overpressure on knees   Toe Raise  20 reps;3 seconds    Other Seated Ankle Exercises  ankle 4-way with red theraband x 10 rep bil             PT Education - 03/04/17 1356    Education provided  Yes    Education Details  theraband HEP    Person(s) Educated  Patient;Spouse    Methods  Explanation;Demonstration;Handout    Comprehension  Verbalized  understanding;Returned demonstration;Need further instruction       PT Short Term Goals - 02/19/17 1319      PT SHORT TERM GOAL #1   Title  verbalize understanding of fall prevention strategies to decrease risk of reinjury    Status  New    Target Date  03/19/17      PT SHORT TERM GOAL #2   Title  improve BERG balance score to >/= 45/56 for decreased fall risk    Status  New    Target Date  03/19/17      PT SHORT TERM GOAL #3   Title  amb > 250' modified independent on various indoor/outdoor surfaces for improved mobility    Status  New    Target Date  03/19/17        PT Long Term Goals - 02/19/17 1320      PT LONG TERM GOAL #1   Title  independent with HEP    Status  New    Target Date  04/16/17  PT LONG TERM GOAL #2   Title  improve gait velocity to > 3.0 ft/sec for improved function    Status  New    Target Date  04/16/17      PT LONG TERM GOAL #3   Title  amb > 400' on various indoor/outdoor surfaces modified independent including ramp/curb for improved community access    Status  New    Target Date  04/16/17      PT LONG TERM GOAL #4   Title  improve Rt ankle strength to at least 4/5 for improved function and stability    Status  New    Target Date  04/16/17            Plan - 03/05/17 6659    Clinical Impression Statement  Pt demonstrated independence with theraband HEP and reports improved symptoms following exercises.  Will continue to benefit from PT to maximize function.    PT Treatment/Interventions  ADLs/Self Care Home Management;Cryotherapy;Electrical Stimulation;Ultrasound;Moist Heat;Neuromuscular re-education;Balance training;Therapeutic exercise;Therapeutic activities;Functional mobility training;Stair training;Gait training;Patient/family education;Manual techniques;Passive range of motion;Vasopneumatic Device;Vestibular;Taping    PT Next Visit Plan  continue balance and ankle exercises    Consulted and Agree with Plan of Care   Patient;Family member/caregiver    Family Member Consulted  wife       Patient will benefit from skilled therapeutic intervention in order to improve the following deficits and impairments:  Abnormal gait, Decreased balance, Decreased mobility, Difficulty walking, Decreased strength, Decreased range of motion, Decreased knowledge of use of DME, Decreased skin integrity, Increased edema  Visit Diagnosis: Unsteadiness on feet  Stiffness of right ankle, not elsewhere classified  Other abnormalities of gait and mobility     Problem List Patient Active Problem List   Diagnosis Date Noted  . Partial tear of right Achilles tendon 02/12/2017  . Aortic atherosclerosis ([place]) 10/07/2016  . C. difficile colitis 09/25/2016  . Aspiration pneumonia ([place]) 09/25/2016  . AKI (acute kidney injury) ([place]) 09/18/2016  . History of skin cancer 08/15/2015  . Undiagnosed cardiac murmurs 08/15/2015  . Diabetic retinopathy ([place]) 02/21/2015  . Former smoker 08/24/2014  . Hyperlipidemia 08/24/2014  . Ingrowing toenail 07/19/2014  . Morbid obesity ([place]) 04/12/2014  . Diabetic polyneuropathy ([place]) 04/11/2009  . BACK PAIN, LUMBAR, WITH RADICULOPATHY 08/30/2008  . DM (diabetes mellitus) type II uncontrolled with eye manifestation ([place]) 02/03/2007  . Essential hypertension 02/03/2007      Laureen Abrahams, PT, DPT 03/05/17 9:26 AM    [facility] [place], [place], [address] Phone: [phone]   Fax:  [phone]  Name: Martin [name] MRN: [id number] Date of Birth: [birth year]

## 2017-03-11 ENCOUNTER — Encounter: Payer: Self-pay | Admitting: Physical Therapy

## 2017-03-11 ENCOUNTER — Ambulatory Visit: Payer: PPO | Admitting: Physical Therapy

## 2017-03-11 DIAGNOSIS — R2681 Unsteadiness on feet: Secondary | ICD-10-CM | POA: Diagnosis not present

## 2017-03-11 DIAGNOSIS — R2689 Other abnormalities of gait and mobility: Secondary | ICD-10-CM

## 2017-03-11 DIAGNOSIS — M25671 Stiffness of right ankle, not elsewhere classified: Secondary | ICD-10-CM

## 2017-03-11 NOTE — Therapy (Signed)
[facility] [address] [place], [place], [address] Phone: [phone]   Fax:  [phone]  Physical Therapy Treatment  Patient Details  Name: Martin [name] MRN: [id number] Date of Birth: 05/12/1945 Referring Provider: Dr. [name]   Encounter Date: 03/11/2017  PT End of Session - 03/11/17 0927    Visit Number  6    Number of Visits  16    Date for PT Re-Evaluation  04/16/17    Authorization Type  HTA - $15 copay    PT Start Time  0845    PT Stop Time  0928    PT Time Calculation (min)  43 min    Activity Tolerance  Patient tolerated treatment well       Past Medical History:  Diagnosis Date  . Diabetes mellitus   . Hypertension     Past Surgical History:  Procedure Laterality Date  . HERNIA REPAIR     >10 years ago  . IR RADIOLOGIST EVAL & MGMT  12/24/2016  . right shoulder surgery     around 2014    There were no vitals filed for this visit.  Subjective Assessment - 03/11/17 0850    Subjective  had a fall this weekend; working on fence and stepped on part of it which tripped him up and he fell down.  Feels like Rt ankle was aggravated a little bit.  No injury and able to get up with support.    Patient is accompained by:  Family member    Pertinent History  DM with neuropathy    Patient Stated Goals  improve balance    Currently in Pain?  No/denies                      [facility] Adult PT Treatment/Exercise - 03/11/17 0856      Ankle Exercises: Seated   Heel Raises  20 reps;3 seconds with overpressure on knees    BAPS  Sitting;Level 2 20 reps    Other Seated Ankle Exercises  ankle 4-way with red theraband x 15 rep bil    Other Seated Ankle Exercises  seated self stretch for dorsiflexion 2x30 sec               PT Short Term Goals - 02/19/17 1319      PT SHORT TERM GOAL #1   Title  verbalize understanding of fall prevention strategies to decrease risk of reinjury    Status  New    Target Date  03/19/17      PT SHORT TERM GOAL #2   Title  improve BERG balance score to >/= 45/56 for decreased fall risk    Status  New    Target Date  03/19/17      PT SHORT TERM GOAL #3   Title  amb > 250' modified independent on various indoor/outdoor surfaces for improved mobility    Status  New    Target Date  03/19/17        PT Long Term Goals - 02/19/17 1320      PT LONG TERM GOAL #1   Title  independent with HEP    Status  New    Target Date  04/16/17      PT LONG TERM GOAL #2   Title  improve gait velocity to > 3.0 ft/sec for improved function    Status  New    Target Date  04/16/17      PT  LONG TERM GOAL #3   Title  amb > 400' on various indoor/outdoor surfaces modified independent including ramp/curb for improved community access    Status  New    Target Date  04/16/17      PT LONG TERM GOAL #4   Title  improve Rt ankle strength to at least 4/5 for improved function and stability    Status  New    Target Date  04/16/17            Plan - 03/11/17 0927    Clinical Impression Statement  Pt tolerated ankle exercises well today with min cues to decrease substitution.  Progressing well with PT.    PT Treatment/Interventions  ADLs/Self Care Home Management;Cryotherapy;Electrical Stimulation;Ultrasound;Moist Heat;Neuromuscular re-education;Balance training;Therapeutic exercise;Therapeutic activities;Functional mobility training;Stair training;Gait training;Patient/family education;Manual techniques;Passive range of motion;Vasopneumatic Device;Vestibular;Taping    PT Next Visit Plan  continue balance and ankle exercises    Consulted and Agree with Plan of Care  Patient;Family member/caregiver    Family Member Consulted  wife       Patient will benefit from skilled therapeutic intervention in order to improve the following deficits and impairments:  Abnormal gait, Decreased balance, Decreased mobility, Difficulty walking, Decreased strength, Decreased range of  motion, Decreased knowledge of use of DME, Decreased skin integrity, Increased edema  Visit Diagnosis: Unsteadiness on feet  Stiffness of right ankle, not elsewhere classified  Other abnormalities of gait and mobility     Problem List Patient Active Problem List   Diagnosis Date Noted  . Partial tear of right Achilles tendon 02/12/2017  . Aortic atherosclerosis ([place]) 10/07/2016  . C. difficile colitis 09/25/2016  . Aspiration pneumonia ([place]) 09/25/2016  . AKI (acute kidney injury) ([place]) 09/18/2016  . History of skin cancer 08/15/2015  . Undiagnosed cardiac murmurs 08/15/2015  . Diabetic retinopathy ([place]) 02/21/2015  . Former smoker 08/24/2014  . Hyperlipidemia 08/24/2014  . Ingrowing toenail 07/19/2014  . Morbid obesity ([place]) 04/12/2014  . Diabetic polyneuropathy ([place]) 04/11/2009  . BACK PAIN, LUMBAR, WITH RADICULOPATHY 08/30/2008  . DM (diabetes mellitus) type II uncontrolled with eye manifestation ([place]) 02/03/2007  . Essential hypertension 02/03/2007     Laureen Abrahams, PT, DPT 03/11/17 9:52 AM    [facility] [place], [place], [address] Phone: [phone]   Fax:  [phone]  Name: Martin [name] MRN: [id number] Date of Birth: February 03, 1946

## 2017-03-13 ENCOUNTER — Telehealth: Payer: Self-pay | Admitting: Family Medicine

## 2017-03-13 MED ORDER — PITAVASTATIN CALCIUM 1 MG PO TABS: 1.0000 mg | ORAL_TABLET | Freq: Every day | ORAL | 5 refills | Status: DC

## 2017-03-13 NOTE — Telephone Encounter (Signed)
I sent in livalo for patient.   Please call him and mychart him. Can he let us know if he will agree to start taking this? Forward this back to me with his answer. Very important with his diabetes to take cholesterol medicine  He also should get physical before year end if he wants as [employer] insurance is getting rid of physicals next year.

## 2017-03-14 ENCOUNTER — Encounter: Payer: Self-pay | Admitting: *Deleted

## 2017-03-14 NOTE — Telephone Encounter (Signed)
Voicemail left requesting call back and a message was sent to patient through [place].

## 2017-03-25 ENCOUNTER — Encounter: Payer: Self-pay | Admitting: Physical Therapy

## 2017-03-25 ENCOUNTER — Encounter: Payer: PPO | Admitting: Physical Therapy

## 2017-03-26 ENCOUNTER — Encounter: Payer: Self-pay | Admitting: Family Medicine

## 2017-03-26 ENCOUNTER — Ambulatory Visit: Payer: PPO | Admitting: Family Medicine

## 2017-03-26 VITALS — BP 164/91 | HR 85 | Ht 72.0 in | Wt 300.0 lb

## 2017-03-26 DIAGNOSIS — I152 Hypertension secondary to endocrine disorders: Secondary | ICD-10-CM

## 2017-03-26 DIAGNOSIS — E1159 Type 2 diabetes mellitus with other circulatory complications: Secondary | ICD-10-CM | POA: Diagnosis not present

## 2017-03-26 DIAGNOSIS — I10 Essential (primary) hypertension: Secondary | ICD-10-CM

## 2017-03-26 DIAGNOSIS — R197 Diarrhea, unspecified: Secondary | ICD-10-CM | POA: Diagnosis not present

## 2017-03-26 MED ORDER — LOPERAMIDE HCL 2 MG PO TABS: 2.0000 mg | ORAL_TABLET | Freq: Four times a day (QID) | ORAL | 0 refills | Status: DC | PRN

## 2017-03-26 NOTE — Patient Instructions (Signed)
Start the loperamide.  Please bring in the stool sample if you can.  Let us know if you have abdominal pain, fever, bloody stools, or worsening diarrhea.  Take care,  Dr Jerline Pain

## 2017-03-26 NOTE — Progress Notes (Signed)
This encounter was created in error - please disregard.

## 2017-03-26 NOTE — Progress Notes (Signed)
    Subjective:  Martin [name] is a 71 y.o. male who presents today with a chief complaint of diarrhea.   HPI:  Diarrhea, Acute Issue Symptoms started 6 days ago.  Reports that he woke up early morning with severe diarrhea.  For the rest with that he reports that he had diarrhea every 1-2 hours.  Stool was loose, however not watery.  No abdominal pain.  No fever.  No hematochezia.  Symptoms seem to resolve over the next couple of days, however yesterday morning he had another episode of loose stool.  He has not had any episodes of diarrhea since then.  No nausea or vomiting.  No known sick contacts.  He had an episode of C. difficile colitis earlier this year and is worried that this may have returned.  Also notes that he ate romaine lettuce about 10 days ago and is concerned that he may have potentially contracted E. coli.  No treatments tried.  Hypertension, Chronic Problem BP Readings from Last 3 Encounters:  03/26/17 (!) 164/91  02/19/17 (!) 148/90  02/12/17 100/72   Current Medications: Valsartan-HCTZ 320-25 1 tablet daily., compliant without side effects.  ROS: Per HPI  PMH: Smoking history reviewed.  Former smoker.  Objective:  Physical Exam: BP (!) 164/91   Pulse 85   Ht 6' (1.829 m)   Wt 300 lb (136.1 kg)   SpO2 97%   BMI 40.69 kg/m   Gen: NAD, resting comfortably CV: RRR with no murmurs appreciated Pulm: NWOB, CTAB with no crackles, wheezes, or rhonchi GI: Obese, normal bowel sounds present. Soft, Nontender, Nondistended.  Assessment/Plan:  Hypertension associated with diabetes ([place]) Above goal today.  Typically well controlled.  Likely elevated due to his anxiety concerning his diarrhea.  No medication change today.  Continue valsartan-HCTZ.  Advised patient to continue checking blood pressure with goal 140/90 or less.  Discussed Diovan recall with patient-states that his pharmacist told him that his prescription was not among those recall.  Diarrhea, acute  issue No red flag signs or symptoms.  Benign abdominal exam.  Likely viral or possibly foodborne pathogen.  No indication for antibiotics at this point.  Given his lack of fever, abdominal pain, bloody diarrhea, or persistent diarrhea, it is reasonable to treat symptomatically with loperamide.  This was sent into his pharmacy.  Given his anxiety over C. difficile, we will also check stool studies.  Return precautions reviewed.  Algis Greenhouse. Jerline Pain, MD 03/26/2017 9:35 AM

## 2017-03-26 NOTE — Assessment & Plan Note (Addendum)
Above goal today.  Typically well controlled.  Likely elevated due to his anxiety concerning his diarrhea.  No medication change today.  Continue valsartan-HCTZ.  Advised patient to continue checking blood pressure with goal 140/90 or less.  Discussed Diovan recall with patient-states that his pharmacist told him that his prescription was not among those recall.

## 2017-03-27 ENCOUNTER — Other Ambulatory Visit: Payer: PPO

## 2017-03-27 ENCOUNTER — Encounter: Payer: Self-pay | Admitting: Family Medicine

## 2017-03-27 DIAGNOSIS — R197 Diarrhea, unspecified: Secondary | ICD-10-CM | POA: Diagnosis not present

## 2017-03-28 LAB — CLOSTRIDIUM DIFFICILE BY PCR

## 2017-04-01 ENCOUNTER — Ambulatory Visit: Payer: PPO | Admitting: Physical Therapy

## 2017-04-01 ENCOUNTER — Encounter: Payer: Self-pay | Admitting: Physical Therapy

## 2017-04-01 DIAGNOSIS — M25671 Stiffness of right ankle, not elsewhere classified: Secondary | ICD-10-CM

## 2017-04-01 DIAGNOSIS — R2681 Unsteadiness on feet: Secondary | ICD-10-CM

## 2017-04-01 DIAGNOSIS — R2689 Other abnormalities of gait and mobility: Secondary | ICD-10-CM | POA: Diagnosis not present

## 2017-04-01 LAB — GASTROINTESTINAL PATHOGEN PANEL PCR
C. difficile Tox A/B, PCR: DETECTED — AB
CAMPYLOBACTER, PCR: NOT DETECTED
CRYPTOSPORIDIUM, PCR: NOT DETECTED
E COLI 0157, PCR: NOT DETECTED
E coli (ETEC) LT/ST PCR: NOT DETECTED
E coli (STEC) stx1/stx2, PCR: NOT DETECTED
GIARDIA LAMBLIA, PCR: NOT DETECTED
NOROVIRUS, PCR: NOT DETECTED
Rotavirus A, PCR: NOT DETECTED
SHIGELLA, PCR: NOT DETECTED
Salmonella, PCR: DETECTED — AB

## 2017-04-01 LAB — TIQ-NTM

## 2017-04-01 NOTE — Progress Notes (Signed)
Patient was positive for C. difficile and  Salmonella.  Please call patient and ask if he is still having diarrhea.  If so, we will need to send in treatment.  Martin [name]. Jerline Pain, MD 04/01/2017 12:49 PM

## 2017-04-01 NOTE — Therapy (Signed)
[facility] [address] [place], [place], [address] Phone: [phone]   Fax:  [phone]  Physical Therapy Treatment  Patient Details  Name: Martin [name] MRN: [id number] Date of Birth: 1945-09-14 Referring Provider: Dr. [name]   Encounter Date: 04/01/2017  PT End of Session - 04/01/17 1429    Visit Number  7    Number of Visits  16    Date for PT Re-Evaluation  04/16/17    Authorization Type  HTA - $15 copay    PT Start Time  1300    PT Stop Time  1342    PT Time Calculation (min)  42 min    Equipment Utilized During Treatment  Gait belt    Activity Tolerance  Patient tolerated treatment well    Behavior During Therapy  [facility] for tasks assessed/performed       Past Medical History:  Diagnosis Date  . Diabetes mellitus   . Hypertension     Past Surgical History:  Procedure Laterality Date  . HERNIA REPAIR     >10 years ago  . IR RADIOLOGIST EVAL & MGMT  12/24/2016  . right shoulder surgery     around 2014    There were no vitals filed for this visit.  Subjective Assessment - 04/01/17 1300    Subjective  doing well; hasn't had results of stool sample.  (looked at results and followed up with Dr Cipriano [place] tx needed as pt without any more symptoms)    Patient Stated Goals  improve balance    Currently in Pain?  No/denies         [facility] PT Assessment - 04/01/17 1315      Ambulation/Gait   Ambulation/Gait  Yes    Ambulation/Gait Assistance  6: Modified independent (Device/Increase time)    Ambulation Distance (Feet)  300 Feet    Assistive device  -- walking stick    Gait Pattern  Decreased stance time - right;Decreased step length - left;Decreased dorsiflexion - right decreased Rt push off    Ambulation Surface  Level;Indoor      Berg Balance Test   Sit to Stand  Able to stand without using hands and stabilize independently    Standing Unsupported  Able to stand safely 2 minutes    Sitting with Back  Unsupported but Feet Supported on Floor or Stool  Able to sit safely and securely 2 minutes    Stand to Sit  Sits safely with minimal use of hands    Transfers  Able to transfer safely, minor use of hands    Standing Unsupported with Eyes Closed  Able to stand 10 seconds safely    Standing Ubsupported with Feet Together  Able to place feet together independently and stand 1 minute safely    From Standing, Reach Forward with Outstretched Arm  Can reach confidently >25 cm (10")    From Standing Position, Pick up Object from Floor  Able to pick up shoe, needs supervision    From Standing Position, Turn to Look Behind Over each Shoulder  Looks behind from both sides and weight shifts well    Turn 360 Degrees  Able to turn 360 degrees safely but slowly    Standing Unsupported, Alternately Place Feet on Step/Stool  Able to stand independently and safely and complete 8 steps in 20 seconds    Standing Unsupported, One Foot in Front  Able to take small step independently and hold 30 seconds    Standing  on One Leg  Able to lift leg independently and hold equal to or more than 3 seconds    Total Score  49                  [facility] Adult PT Treatment/Exercise - 04/01/17 1315      Self-Care   Self-Care  Other Self-Care Comments    Other Self-Care Comments   educated on fall prevention strategies             PT Education - 04/01/17 1429    Education provided  Yes    Education Details  fall prevention strategies    Person(s) Educated  Patient;Spouse    Methods  Explanation;Handout    Comprehension  Verbalized understanding       PT Short Term Goals - 04/01/17 1324      PT SHORT TERM GOAL #1   Title  verbalize understanding of fall prevention strategies to decrease risk of reinjury    Status  Achieved      PT SHORT TERM GOAL #2   Title  improve BERG balance score to >/= 45/56 for decreased fall risk    Status  Achieved      PT SHORT TERM GOAL #3   Title  amb > 250' modified  independent on various indoor/outdoor surfaces for improved mobility    Status  Achieved        PT Long Term Goals - 02/19/17 1320      PT LONG TERM GOAL #1   Title  independent with HEP    Status  New    Target Date  04/16/17      PT LONG TERM GOAL #2   Title  improve gait velocity to > 3.0 ft/sec for improved function    Status  New    Target Date  04/16/17      PT LONG TERM GOAL #3   Title  amb > 400' on various indoor/outdoor surfaces modified independent including ramp/curb for improved community access    Status  New    Target Date  04/16/17      PT LONG TERM GOAL #4   Title  improve Rt ankle strength to at least 4/5 for improved function and stability    Status  New    Target Date  04/16/17            Plan - 04/01/17 1429    Clinical Impression Statement  Pt has met all STGs and is progressing well with PT.  Overall making great progress.  Will plan to progress balance exercises next session.    PT Treatment/Interventions  ADLs/Self Care Home Management;Cryotherapy;Electrical Stimulation;Ultrasound;Moist Heat;Neuromuscular re-education;Balance training;Therapeutic exercise;Therapeutic activities;Functional mobility training;Stair training;Gait training;Patient/family education;Manual techniques;Passive range of motion;Vasopneumatic Device;Vestibular;Taping    PT Next Visit Plan  continue balance and ankle exercises    Consulted and Agree with Plan of Care  Patient;Family member/caregiver    Family Member Consulted  wife       Patient will benefit from skilled therapeutic intervention in order to improve the following deficits and impairments:  Abnormal gait, Decreased balance, Decreased mobility, Difficulty walking, Decreased strength, Decreased range of motion, Decreased knowledge of use of DME, Decreased skin integrity, Increased edema  Visit Diagnosis: Unsteadiness on feet  Stiffness of right ankle, not elsewhere classified  Other abnormalities of gait and  mobility     Problem List Patient Active Problem List   Diagnosis Date Noted  . Partial tear of right Achilles tendon 02/12/2017  .  Aortic atherosclerosis ([place]) 10/07/2016  . C. difficile colitis 09/25/2016  . Aspiration pneumonia ([place]) 09/25/2016  . AKI (acute kidney injury) ([place]) 09/18/2016  . History of skin cancer 08/15/2015  . Undiagnosed cardiac murmurs 08/15/2015  . Diabetic retinopathy ([place]) 02/21/2015  . Former smoker 08/24/2014  . Hyperlipidemia 08/24/2014  . Ingrowing toenail 07/19/2014  . Morbid obesity ([place]) 04/12/2014  . Diabetic polyneuropathy ([place]) 04/11/2009  . BACK PAIN, LUMBAR, WITH RADICULOPATHY 08/30/2008  . DM (diabetes mellitus) type II uncontrolled with eye manifestation ([place]) 02/03/2007  . Hypertension associated with diabetes ([place]) 02/03/2007      Laureen Abrahams, PT, DPT 04/01/17 2:31 PM     [facility] [place], [place], [address] Phone: [phone]   Fax:  [phone]  Name: Martin [name] MRN: [id number] Date of Birth: October 08, 1945

## 2017-04-01 NOTE — Patient Instructions (Signed)

## 2017-04-03 ENCOUNTER — Ambulatory Visit: Payer: PPO | Admitting: Physical Therapy

## 2017-04-03 ENCOUNTER — Encounter: Payer: Self-pay | Admitting: Physical Therapy

## 2017-04-03 DIAGNOSIS — R2681 Unsteadiness on feet: Secondary | ICD-10-CM | POA: Diagnosis not present

## 2017-04-03 DIAGNOSIS — M25671 Stiffness of right ankle, not elsewhere classified: Secondary | ICD-10-CM

## 2017-04-03 DIAGNOSIS — R2689 Other abnormalities of gait and mobility: Secondary | ICD-10-CM | POA: Diagnosis not present

## 2017-04-03 NOTE — Therapy (Addendum)
[facility] [address] [place], [place], [address] Phone: [phone]   Fax:  [phone]  Physical Therapy Treatment/Discharge  Patient Details  Name: Martin [name] MRN: [id number] Date of Birth: 1946/01/29 Referring Provider: Dr. [name]   Encounter Date: 04/03/2017  PT End of Session - 04/03/17 0922    Visit Number  8    Number of Visits  16    Date for PT Re-Evaluation  04/16/17    Authorization Type  HTA - $15 copay    PT Start Time  0845    PT Stop Time  0925    PT Time Calculation (min)  40 min    Equipment Utilized During Treatment  Gait belt    Activity Tolerance  Patient tolerated treatment well    Behavior During Therapy  [facility] for tasks assessed/performed       Past Medical History:  Diagnosis Date  . Diabetes mellitus   . Hypertension     Past Surgical History:  Procedure Laterality Date  . HERNIA REPAIR     >10 years ago  . IR RADIOLOGIST EVAL & MGMT  12/24/2016  . right shoulder surgery     around 2014    There were no vitals filed for this visit.  Subjective Assessment - 04/03/17 0847    Subjective  no new complaints; did a lot of walking/shopping yesterday    Patient is accompained by:  Family member    Patient Stated Goals  improve balance    Currently in Pain?  No/denies                      [facility] Adult PT Treatment/Exercise - 04/03/17 0854      Knee/Hip Exercises: Standing   Heel Raises  Both;10 reps    Heel Raises Limitations  toe raises x 10    Hip Flexion  Both;15 reps;Knee bent    Hip Flexion Limitations  alternating with UE support    Hip Abduction  Both;15 reps;Knee straight    Abduction Limitations  alternating with UE support    Hip Extension  Both;15 reps;Knee straight    Extension Limitations  alternating with UE support      Ankle Exercises: Seated   ABC's  1 rep    Ankle Circles/Pumps  15 reps;Both    Heel Raises  15 reps;3 seconds    Toe Raise  15  reps;3 seconds          Balance Exercises - 04/03/17 0915      Balance Exercises: Standing   Tandem Gait  Forward;Retro;Upper extremity support;4 reps    Other Standing Exercises  marching, sidestepping, backwards walking with walking stick 20'x 4          PT Short Term Goals - 04/01/17 1324      PT SHORT TERM GOAL #1   Title  verbalize understanding of fall prevention strategies to decrease risk of reinjury    Status  Achieved      PT SHORT TERM GOAL #2   Title  improve BERG balance score to >/= 45/56 for decreased fall risk    Status  Achieved      PT SHORT TERM GOAL #3   Title  amb > 250' modified independent on various indoor/outdoor surfaces for improved mobility    Status  Achieved        PT Long Term Goals - 02/19/17 1320      PT LONG TERM GOAL #  1   Title  independent with HEP    Status  New    Target Date  04/16/17      PT LONG TERM GOAL #2   Title  improve gait velocity to > 3.0 ft/sec for improved function    Status  New    Target Date  04/16/17      PT LONG TERM GOAL #3   Title  amb > 400' on various indoor/outdoor surfaces modified independent including ramp/curb for improved community access    Status  New    Target Date  04/16/17      PT LONG TERM GOAL #4   Title  improve Rt ankle strength to at least 4/5 for improved function and stability    Status  New    Target Date  04/16/17            Plan - 04/03/17 8546    Clinical Impression Statement  Pt tolerated balance exercises well today with occasional rest breaks needed.  Overall progressing well towards goals.    PT Treatment/Interventions  ADLs/Self Care Home Management;Cryotherapy;Electrical Stimulation;Ultrasound;Moist Heat;Neuromuscular re-education;Balance training;Therapeutic exercise;Therapeutic activities;Functional mobility training;Stair training;Gait training;Patient/family education;Manual techniques;Passive range of motion;Vasopneumatic Device;Vestibular;Taping    PT Next  Visit Plan  continue balance and ankle exercises    Consulted and Agree with Plan of Care  Patient;Family member/caregiver    Family Member Consulted  wife       Patient will benefit from skilled therapeutic intervention in order to improve the following deficits and impairments:  Abnormal gait, Decreased balance, Decreased mobility, Difficulty walking, Decreased strength, Decreased range of motion, Decreased knowledge of use of DME, Decreased skin integrity, Increased edema  Visit Diagnosis: Unsteadiness on feet  Stiffness of right ankle, not elsewhere classified  Other abnormalities of gait and mobility     Problem List Patient Active Problem List   Diagnosis Date Noted  . Partial tear of right Achilles tendon 02/12/2017  . Aortic atherosclerosis ([place]) 10/07/2016  . C. difficile colitis 09/25/2016  . Aspiration pneumonia ([place]) 09/25/2016  . AKI (acute kidney injury) ([place]) 09/18/2016  . History of skin cancer 08/15/2015  . Undiagnosed cardiac murmurs 08/15/2015  . Diabetic retinopathy ([place]) 02/21/2015  . Former smoker 08/24/2014  . Hyperlipidemia 08/24/2014  . Ingrowing toenail 07/19/2014  . Morbid obesity ([place]) 04/12/2014  . Diabetic polyneuropathy ([place]) 04/11/2009  . BACK PAIN, LUMBAR, WITH RADICULOPATHY 08/30/2008  . DM (diabetes mellitus) type II uncontrolled with eye manifestation ([place]) 02/03/2007  . Hypertension associated with diabetes ([place]) 02/03/2007      Laureen Abrahams, PT, DPT 04/03/17 9:25 AM    [facility] [place], [place], [address] Phone: [phone]   Fax:  [phone]  Name: Martin [name] MRN: [id number] Date of Birth: June 11, 1945      PHYSICAL THERAPY DISCHARGE SUMMARY  Visits from Start of Care: 8  Current functional level related to goals / functional outcomes: See above; pt cx remaining 5 appts and did not call back to reschedule   Remaining deficits: unknown    Education / Equipment: HEP  Plan: Patient agrees to discharge.  Patient goals were not met. Patient is being discharged due to not returning since the last visit.  ?????     Laureen Abrahams, PT, DPT 05/30/17 10:53 AM   [facility] [address] [place], [place], [address] Phone: [phone]  Fax: [phone]

## 2017-04-09 ENCOUNTER — Encounter (INDEPENDENT_AMBULATORY_CARE_PROVIDER_SITE_OTHER): Payer: PPO | Admitting: Ophthalmology

## 2017-05-15 ENCOUNTER — Ambulatory Visit (INDEPENDENT_AMBULATORY_CARE_PROVIDER_SITE_OTHER): Payer: PPO | Admitting: Family Medicine

## 2017-05-15 ENCOUNTER — Encounter: Payer: Self-pay | Admitting: Family Medicine

## 2017-05-15 ENCOUNTER — Ambulatory Visit (INDEPENDENT_AMBULATORY_CARE_PROVIDER_SITE_OTHER): Payer: PPO

## 2017-05-15 VITALS — BP 140/76 | HR 98 | Temp 98.3°F | Ht 72.0 in | Wt 291.0 lb

## 2017-05-15 DIAGNOSIS — R0989 Other specified symptoms and signs involving the circulatory and respiratory systems: Secondary | ICD-10-CM

## 2017-05-15 DIAGNOSIS — I10 Essential (primary) hypertension: Secondary | ICD-10-CM | POA: Diagnosis not present

## 2017-05-15 DIAGNOSIS — R052 Subacute cough: Secondary | ICD-10-CM

## 2017-05-15 DIAGNOSIS — I152 Hypertension secondary to endocrine disorders: Secondary | ICD-10-CM

## 2017-05-15 DIAGNOSIS — E1139 Type 2 diabetes mellitus with other diabetic ophthalmic complication: Secondary | ICD-10-CM | POA: Diagnosis not present

## 2017-05-15 DIAGNOSIS — IMO0002 Reserved for concepts with insufficient information to code with codable children: Secondary | ICD-10-CM

## 2017-05-15 DIAGNOSIS — E1159 Type 2 diabetes mellitus with other circulatory complications: Secondary | ICD-10-CM | POA: Diagnosis not present

## 2017-05-15 DIAGNOSIS — R05 Cough: Secondary | ICD-10-CM | POA: Diagnosis not present

## 2017-05-15 DIAGNOSIS — E1142 Type 2 diabetes mellitus with diabetic polyneuropathy: Secondary | ICD-10-CM

## 2017-05-15 DIAGNOSIS — E785 Hyperlipidemia, unspecified: Secondary | ICD-10-CM

## 2017-05-15 DIAGNOSIS — E1165 Type 2 diabetes mellitus with hyperglycemia: Secondary | ICD-10-CM

## 2017-05-15 MED ORDER — AZITHROMYCIN 250 MG PO TABS
ORAL_TABLET | ORAL | 0 refills | Status: DC
Start: 2017-05-15 — End: 2017-05-21

## 2017-05-15 NOTE — Assessment & Plan Note (Signed)
Has follow up with Dr. Loanne Drilling next week. Weight down 9 lbs- but unfortunately this is due to his illnesses and not healthy habits

## 2017-05-15 NOTE — Assessment & Plan Note (Signed)
S: poorly controlled on no statin- he did not restart the livalo. No myalgias.  Lab Results  Component Value Date   CHOL 198 08/08/2015   HDL 33.00 (L) 08/08/2015   LDLCALC 127 (H) 08/08/2015   LDLDIRECT 139.0 02/12/2017   TRIG 189.0 (H) 08/08/2015   CHOLHDL 6 08/08/2015   A/P: discussed when over acute illness to restart the livalo

## 2017-05-15 NOTE — Patient Instructions (Addendum)
When over your illness- can go ahead and restart livalo first and if do well with that then can try the amitriptyline as well  Lets recheck in 3 months your blood pressure- if high when you are not ill may need to increase medication. Once you get to feeling better try to get more active and improve the diet- low salt idally and see if this makes a difference as well  You can leave after x-ray  I want you to take azithromycin (being aggressive given your age and diabetes) for possible bacterial bronchitis (though most bronchitis is viral honestly). If your x-ray shows pneumonia- I may add a second antibiotic depending on how it looks

## 2017-05-15 NOTE — Assessment & Plan Note (Signed)
S:  last visit we opted to try amitriptyline 10mg  for his neuropathy. Had dizziness in past at 50mg - we were considering 25mg  vs lyrica. He did PT for some balance issues with [place] with this and with achilles- doing better from that front A/P: he will consider trying- has held off due to several illnesses

## 2017-05-15 NOTE — Assessment & Plan Note (Signed)
S: controlled on valsartan 320-25mg . Last visit noted as controlled but hadnt pulled value into note under BP section BP Readings from Last 3 Encounters:  05/15/17 140/76  03/26/17 (!) 164/91  02/19/17 (!) 148/90  A/P: We discussed blood pressure goal of <140/90. Continue current meds:  Discussed healthy eating and regular exercise- if BP high in 3 months and not acutely ill then would consider adjusting dose up

## 2017-05-15 NOTE — Progress Notes (Signed)
Subjective:  Martin [name] is a 72 y.o. year old very pleasant male patient who presents for/with See problem oriented charting ROS- cough, congestion- mildest of shortness of breath. Nasal congestion now improved but cough continues. No fever or chills. No chest pain.    Past Medical History-  Patient Active Problem List   Diagnosis Date Noted  . Undiagnosed cardiac murmurs 08/15/2015    Priority: High  . Morbid obesity ([place]) 04/12/2014    Priority: High  . DM (diabetes mellitus) type II uncontrolled with eye manifestation ([place]) 02/03/2007    Priority: High  . Partial tear of right Achilles tendon 02/12/2017    Priority: Medium  . History of skin cancer 08/15/2015    Priority: Medium  . Diabetic retinopathy ([place]) 02/21/2015    Priority: Medium  . Hyperlipidemia 08/24/2014    Priority: Medium  . Diabetic polyneuropathy ([place]) 04/11/2009    Priority: Medium  . Hypertension associated with diabetes ([place]) 02/03/2007    Priority: Medium  . Former smoker 08/24/2014    Priority: Low  . Ingrowing toenail 07/19/2014    Priority: Low  . BACK PAIN, LUMBAR, WITH RADICULOPATHY 08/30/2008    Priority: Low  . Aortic atherosclerosis ([place]) 10/07/2016  . C. difficile colitis 09/25/2016  . Aspiration pneumonia ([place]) 09/25/2016  . AKI (acute kidney injury) ([place]) 09/18/2016    Medications- reviewed and updated Current Outpatient Medications  Medication Sig Dispense Refill  . amitriptyline (ELAVIL) 10 MG tablet Take 1 tablet (10 mg total) by mouth at bedtime. 30 tablet 5  . BESIVANCE 0.6 % SUSP Place 1 drop into the left eye 3 (three) times daily.  1  . glucose blood (ONETOUCH VERIO) test strip 1 each by Other route 4 (four) times daily. And lancets 4/day 120 each 12  . Insulin Glargine (LANTUS SOLOSTAR) 100 UNIT/ML Solostar Pen Inject 200 Units into the skin every morning. 25 pen 11  . Insulin Pen Needle (NOVOFINE) 32G X 6 MM MISC USE 5 TIMES DAILY 300 each 3  . loperamide (IMODIUM A-D) 2 MG  tablet Take 1 tablet (2 mg total) by mouth 4 (four) times daily as needed for diarrhea or loose stools. 30 tablet 0  . Pitavastatin Calcium 1 MG TABS Take 1 tablet (1 mg total) daily by mouth. 30 tablet 5  . valsartan-hydrochlorothiazide (DIOVAN-HCT) 320-25 MG tablet TAKE 1 TABLET BY MOUTH DAILY. 30 tablet 3   No current facility-administered medications for this visit.     Objective: BP 140/76 (BP Location: Left Arm, Patient Position: Sitting, Cuff Size: Large)   Pulse 98   Temp 98.3 F (36.8 C) (Oral)   Ht 6' (1.829 m)   Wt 291 lb (132 kg)   SpO2 96%   BMI 39.47 kg/m  Gen: NAD, appears, slightly sweaty to touch Oropharynx and nasal turbinates largely normal. TM normal bilaterally CV: RRR no murmurs rubs or gallops Lungs: CTAB  Other than crackles in left lung base Abdomen: obese Skin: warm, dry, no rash  Assessment/Plan:  Subacute cough - Plan: DG Chest 2 View Respiratory crackles at left lung base - Plan: DG Chest 2 View DM (diabetes mellitus) type II uncontrolled with eye manifestation ([place]) S: 21st of December came down with a cough- thought it was his allergies. Has had continued cough - over last week- clear sputum, congestion is better. Used allegra around Christmas and somewhat helpful and also has used mucinex DM. Appetite low- has lost 9 lbs over this time frame. Former smoker quit 2002.  A/P: cough for 4 weeks now- former smoker. Crackles left lung base. Get x-ray to rule out pneumonia. With duration of symptoms, age, diabetes will treat with azithromycin at least- may add 2nd agent if pneumonia on x-ray.   Hypertension associated with diabetes ([place]) S: controlled on valsartan 320-25mg . Last visit noted as controlled but hadnt pulled value into note under BP section BP Readings from Last 3 Encounters:  05/15/17 140/76  03/26/17 (!) 164/91  02/19/17 (!) 148/90  A/P: We discussed blood pressure goal of <140/90. Continue current meds:  Discussed healthy eating and  regular exercise- if BP high in 3 months and not acutely ill then would consider adjusting dose up  Hyperlipidemia S: poorly controlled on no statin- he did not restart the livalo. No myalgias.  Lab Results  Component Value Date   CHOL 198 08/08/2015   HDL 33.00 (L) 08/08/2015   LDLCALC 127 (H) 08/08/2015   LDLDIRECT 139.0 02/12/2017   TRIG 189.0 (H) 08/08/2015   CHOLHDL 6 08/08/2015   A/P: discussed when over acute illness to restart the livalo  Diabetic polyneuropathy ([place]) S:  last visit we opted to try amitriptyline 10mg  for his neuropathy. Had dizziness in past at 50mg - we were considering 25mg  vs lyrica. He did PT for some balance issues with [place] with this and with achilles- doing better from that front A/P: he will consider trying- has held off due to several illnesses  DM (diabetes mellitus) type II uncontrolled with eye manifestation ([place]) Has follow up with Dr. Loanne Drilling next week. Weight down 9 lbs- but unfortunately this is due to his illnesses and not healthy habits   Future Appointments  Date Time Provider [place]  05/21/2017  8:00 AM Renato Shin, MD [facility] None   3 months  Meds ordered this encounter  Medications  . azithromycin (ZITHROMAX) 250 MG tablet    Sig: Take 2 tabs on day 1, then 1 tab daily until finished    Dispense:  6 tablet    Refill:  0   Return precautions advised.  [name], MD

## 2017-05-20 DIAGNOSIS — L814 Other melanin hyperpigmentation: Secondary | ICD-10-CM | POA: Diagnosis not present

## 2017-05-20 DIAGNOSIS — D225 Melanocytic nevi of trunk: Secondary | ICD-10-CM | POA: Diagnosis not present

## 2017-05-20 DIAGNOSIS — L570 Actinic keratosis: Secondary | ICD-10-CM | POA: Diagnosis not present

## 2017-05-20 DIAGNOSIS — L821 Other seborrheic keratosis: Secondary | ICD-10-CM | POA: Diagnosis not present

## 2017-05-20 DIAGNOSIS — Z85828 Personal history of other malignant neoplasm of skin: Secondary | ICD-10-CM | POA: Diagnosis not present

## 2017-05-21 ENCOUNTER — Ambulatory Visit (INDEPENDENT_AMBULATORY_CARE_PROVIDER_SITE_OTHER): Payer: PPO | Admitting: Endocrinology

## 2017-05-21 ENCOUNTER — Encounter: Payer: Self-pay | Admitting: Endocrinology

## 2017-05-21 VITALS — BP 148/88 | HR 101 | Wt 285.4 lb

## 2017-05-21 DIAGNOSIS — E1165 Type 2 diabetes mellitus with hyperglycemia: Secondary | ICD-10-CM

## 2017-05-21 DIAGNOSIS — IMO0002 Reserved for concepts with insufficient information to code with codable children: Secondary | ICD-10-CM

## 2017-05-21 DIAGNOSIS — E1139 Type 2 diabetes mellitus with other diabetic ophthalmic complication: Secondary | ICD-10-CM

## 2017-05-21 LAB — POCT GLYCOSYLATED HEMOGLOBIN (HGB A1C): HEMOGLOBIN A1C: 8.1

## 2017-05-21 MED ORDER — INSULIN GLARGINE 100 UNIT/ML SOLOSTAR PEN: 160.0000 [IU] | PEN_INJECTOR | SUBCUTANEOUS | 11 refills | Status: DC

## 2017-05-21 NOTE — Progress Notes (Signed)
Subjective:    Patient ID: Martin [name], male    DOB: 03-Dec-1945, 72 y.o.   MRN: [id number]  HPI Pt returns for f/u of diabetes mellitus: DM type: Insulin-requiring type 2.  Dx'ed: 3382 Complications: polyneuropathy, foot ulcer, renal insuff, and retinopathy.  Therapy: insulin since 2013.  DKA: never.   Severe hypoglycemia: never.  Pancreatitis: never.   Other: he chose to change to a qd insulin regimen, after poor results with multiple daily injections; he declines weight loss surgery.   Interval history: no cbg record, but states cbg's are in the mid-100's.  He has reduced insulin to 150 units qd, due to reduced caloric intake.  He has had several illnesses since last ov here.  Past Medical History:  Diagnosis Date  . Diabetes mellitus   . Hypertension     Past Surgical History:  Procedure Laterality Date  . HERNIA REPAIR     >10 years ago  . IR RADIOLOGIST EVAL & MGMT  12/24/2016  . right shoulder surgery     around 2014    Social History   Socioeconomic History  . Marital status: Married    Spouse name: Not on file  . Number of children: Not on file  . Years of education: Not on file  . Highest education level: Not on file  Social Needs  . Financial resource strain: Not on file  . Food insecurity - worry: Not on file  . Food insecurity - inability: Not on file  . Transportation needs - medical: Not on file  . Transportation needs - non-medical: Not on file  Occupational History  . Not on file  Tobacco Use  . Smoking status: Former Smoker    Last attempt to quit: 04/29/2000    Years since quitting: 17.0  . Smokeless tobacco: Never Used  . Tobacco comment: smoked for 10 years on and off  Substance and Sexual Activity  . Alcohol use: Yes    Alcohol/week: 0.0 oz    Comment: 6 martinis a year  . Drug use: No  . Sexual activity: Yes  Other Topics Concern  . Not on file  Social History Narrative   Married. 3 kids. 7 grandkids. No greatgrandkids.       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 amitriptyline (ELAVIL) 10 MG tablet Take 1 tablet (10 mg total) by mouth at bedtime. 30 tablet 5  . BESIVANCE 0.6 % SUSP Place 1 drop into the left eye 3 (three) times daily.  1  . glucose blood (ONETOUCH VERIO) test strip 1 each by Other route 4 (four) times daily. And lancets 4/day 120 each 12  . Insulin Pen Needle (NOVOFINE) 32G X 6 MM MISC USE 5 TIMES DAILY 300 each 3  . Pitavastatin Calcium 1 MG TABS Take 1 tablet (1 mg total) daily by mouth. 30 tablet 5  . valsartan-hydrochlorothiazide (DIOVAN-HCT) 320-25 MG tablet TAKE 1 TABLET BY MOUTH DAILY. 30 tablet 3   No current facility-administered medications on file prior to visit.     Allergies  Allergen Reactions  . Simvastatin Diarrhea    Family History  Problem Relation Age of Onset  . Hypertension Mother   . Heart disease Mother        CHF did not see doctor  . Diabetes Maternal Grandmother   .  Diabetes Son     BP (!) 148/88 (BP Location: Left Wrist, Patient Position: Sitting, Cuff Size: Normal)   Pulse (!) 101   Wt 285 lb 6.4 oz (129.5 kg)   SpO2 95%   BMI 38.71 kg/m    Review of Systems He has lost 18 lbs.      Objective:   Physical Exam VITAL SIGNS: See vs page.    GENERAL: no distress.   Pulses: dorsalis pedis intact bilat. MSK: no deformity of the feet.  CV: 2+ bilat leg edema.  Skin: healed ulcer at the left great toe. normal temp on the feet.  Neuro: sensation is intact to touch on the feet, but severely decreased from normal.   Lab Results  Component Value Date   HGBA1C 8.1 05/21/2017       Assessment & Plan:  Insulin-requiring type 2 DM, with renal insuff: he needs increased rx.     Patient Instructions  check your blood sugar 4 times a day.  vary the time of day when you check, between  before the 3 meals, and at bedtime.  also check if you have symptoms of your blood sugar being too high or too low.  please keep a record of the readings and bring it to your next appointment here.  You can write it on any piece of paper.  please call us sooner if your blood sugar goes below 70, or if you have a lot of readings over 200.   Please increase the insulin to 160 units daily.   please come back for a follow-up appointment in 2 months.

## 2017-05-21 NOTE — Patient Instructions (Addendum)
check your blood sugar 4 times a day.  vary the time of day when you check, between before the 3 meals, and at bedtime.  also check if you have symptoms of your blood sugar being too high or too low.  please keep a record of the readings and bring it to your next appointment here.  You can write it on any piece of paper.  please call us sooner if your blood sugar goes below 70, or if you have a lot of readings over 200.   Please increase the insulin to 160 units daily.   please come back for a follow-up appointment in 2 months.

## 2017-06-04 ENCOUNTER — Other Ambulatory Visit: Payer: Self-pay | Admitting: Family Medicine

## 2017-06-25 ENCOUNTER — Encounter (INDEPENDENT_AMBULATORY_CARE_PROVIDER_SITE_OTHER): Payer: PPO | Admitting: Ophthalmology

## 2017-06-25 DIAGNOSIS — H43813 Vitreous degeneration, bilateral: Secondary | ICD-10-CM

## 2017-06-25 DIAGNOSIS — H35033 Hypertensive retinopathy, bilateral: Secondary | ICD-10-CM

## 2017-06-25 DIAGNOSIS — E11311 Type 2 diabetes mellitus with unspecified diabetic retinopathy with macular edema: Secondary | ICD-10-CM | POA: Diagnosis not present

## 2017-06-25 DIAGNOSIS — I10 Essential (primary) hypertension: Secondary | ICD-10-CM

## 2017-06-25 DIAGNOSIS — E113313 Type 2 diabetes mellitus with moderate nonproliferative diabetic retinopathy with macular edema, bilateral: Secondary | ICD-10-CM | POA: Diagnosis not present

## 2017-06-25 LAB — HM DIABETES EYE EXAM

## 2017-06-27 ENCOUNTER — Encounter: Payer: Self-pay | Admitting: Family Medicine

## 2017-07-16 ENCOUNTER — Encounter: Payer: Self-pay | Admitting: Endocrinology

## 2017-07-16 ENCOUNTER — Ambulatory Visit (INDEPENDENT_AMBULATORY_CARE_PROVIDER_SITE_OTHER): Payer: PPO | Admitting: Endocrinology

## 2017-07-16 VITALS — BP 122/78 | HR 88 | Ht 72.0 in | Wt 292.0 lb

## 2017-07-16 DIAGNOSIS — IMO0002 Reserved for concepts with insufficient information to code with codable children: Secondary | ICD-10-CM

## 2017-07-16 DIAGNOSIS — E1165 Type 2 diabetes mellitus with hyperglycemia: Secondary | ICD-10-CM | POA: Diagnosis not present

## 2017-07-16 DIAGNOSIS — E1139 Type 2 diabetes mellitus with other diabetic ophthalmic complication: Secondary | ICD-10-CM

## 2017-07-16 LAB — HEMOGLOBIN A1C: Hgb A1c MFr Bld: 8.5 % — ABNORMAL HIGH (ref 4.6–6.5)

## 2017-07-16 MED ORDER — INSULIN GLARGINE 100 UNIT/ML SOLOSTAR PEN: 180.0000 [IU] | PEN_INJECTOR | SUBCUTANEOUS | 11 refills | Status: DC

## 2017-07-16 MED ORDER — INSULIN GLARGINE 100 UNIT/ML SOLOSTAR PEN: 170.0000 [IU] | PEN_INJECTOR | SUBCUTANEOUS | 11 refills | Status: DC

## 2017-07-16 NOTE — Progress Notes (Signed)
Subjective:    Patient ID: Martin [name], male    DOB: [birth year], 72 y.o.   MRN: [id number]  HPI Pt returns for f/u of diabetes mellitus: DM type: Insulin-requiring type 2.  Dx'ed: 9563 Complications: polyneuropathy, foot ulcer, renal insuff, and retinopathy.  Therapy: insulin since 2013.  DKA: never.   Severe hypoglycemia: never.  Pancreatitis: never.   Other: he chose to change to a qd insulin regimen, after poor results with multiple daily injections; he declines weight loss surgery.   Interval history: no cbg record, but states cbg's are in the mid-100's.  There is no trend throughout the day.  He takes insulin as rx'ed.   Past Medical History:  Diagnosis Date  . Diabetes mellitus   . Hypertension     Past Surgical History:  Procedure Laterality Date  . HERNIA REPAIR     >10 years ago  . IR RADIOLOGIST EVAL & MGMT  12/24/2016  . right shoulder surgery     around 2014    Social History   Socioeconomic History  . Marital status: Married    Spouse name: Not on file  . Number of children: Not on file  . Years of education: Not on file  . Highest education level: Not on file  Occupational History  . Not on file  Social Needs  . Financial resource strain: Not on file  . Food insecurity:    Worry: Not on file    Inability: Not on file  . Transportation needs:    Medical: Not on file    Non-medical: Not on file  Tobacco Use  . Smoking status: Former Smoker    Last attempt to quit: 04/29/2000    Years since quitting: 17.2  . Smokeless tobacco: Never Used  . Tobacco comment: smoked for 10 years on and off  Substance and Sexual Activity  . Alcohol use: Yes    Alcohol/week: 0.0 oz    Comment: 6 martinis a year  . Drug use: No  . Sexual activity: Yes  Lifestyle  . Physical activity:    Days per week: Not on file    Minutes per session: Not on file  . Stress: Not on file  Relationships  . Social connections:    Talks on phone: Not on file    Gets together:  Not on file    Attends religious service: Not on file    Active member of club or organization: Not on file    Attends meetings of clubs or organizations: Not on file    Relationship status: Not on file  . Intimate partner violence:    Fear of current or ex partner: Not on file    Emotionally abused: Not on file    Physically abused: Not on file    Forced sexual activity: Not on file  Other Topics Concern  . Not on file  Social History Narrative   Married. 3 kids. 7 grandkids. No greatgrandkids.       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 amitriptyline (ELAVIL) 10 MG tablet Take 1 tablet (10 mg total) by mouth at bedtime. 30 tablet 5  . BESIVANCE 0.6 % SUSP Place 1 drop into the left eye 3 (three) times daily.  1  . glucose blood (ONETOUCH VERIO) test strip 1 each by Other  route 4 (four) times daily. And lancets 4/day 120 each 12  . Insulin Pen Needle (NOVOFINE) 32G X 6 MM MISC USE 5 TIMES DAILY 300 each 3  . Pitavastatin Calcium 1 MG TABS Take 1 tablet (1 mg total) daily by mouth. 30 tablet 5  . valsartan-hydrochlorothiazide (DIOVAN-HCT) 320-25 MG tablet TAKE 1 TABLET BY MOUTH DAILY. 30 tablet 3   No current facility-administered medications on file prior to visit.     Allergies  Allergen Reactions  . Simvastatin Diarrhea    Family History  Problem Relation Age of Onset  . Hypertension Mother   . Heart disease Mother        CHF did not see doctor  . Diabetes Maternal Grandmother   . Diabetes Son     BP 122/78   Pulse 88   Ht 6' (1.829 m)   Wt 292 lb (132.5 kg)   SpO2 97%   BMI 39.60 kg/m    Review of Systems He denies hypoglycemia    Objective:   Physical Exam VITAL SIGNS: See vs page.    GENERAL: no distress.   Pulses: dorsalis pedis intact bilat. MSK: no deformity of the feet.  CV: 2+  bilat leg edema.  Skin: healed ulcer at the left great toe. normal temp on the feet.  Neuro: sensation is intact to touch on the feet, but severely decreased from normal.  Ext: There is severe bilateral onychomycosis of the toenails.   Lab Results  Component Value Date   HGBA1C 8.5 (H) 07/16/2017   Lab Results  Component Value Date   CREATININE 1.23 02/12/2017   BUN 22 02/12/2017   NA 138 02/12/2017   K 4.8 02/12/2017   CL 100 02/12/2017   CO2 26 02/12/2017       Assessment & Plan:  Insulin-requiring type 2 DM, with DR: worse.  Increase lantus to 180/d   Patient Instructions  check your blood sugar 4 times a day.  vary the time of day when you check, between before the 3 meals, and at bedtime.  also check if you have symptoms of your blood sugar being too high or too low.  please keep a record of the readings and bring it to your next appointment here.  You can write it on any piece of paper.  please call us sooner if your blood sugar goes below 70, or if you have a lot of readings over 200.   A diabetes blood test is requested for you today.  We'll let you know about the results. please come back for a follow-up appointment in 2 months.

## 2017-07-16 NOTE — Patient Instructions (Addendum)
check your blood sugar 4 times a day.  vary the time of day when you check, between before the 3 meals, and at bedtime.  also check if you have symptoms of your blood sugar being too high or too low.  please keep a record of the readings and bring it to your next appointment here.  You can write it on any piece of paper.  please call us sooner if your blood sugar goes below 70, or if you have a lot of readings over 200.   A diabetes blood test is requested for you today.  We'll let you know about the results. please come back for a follow-up appointment in 2 months.

## 2017-07-17 ENCOUNTER — Other Ambulatory Visit: Payer: Self-pay

## 2017-07-17 MED ORDER — INSULIN GLARGINE 100 UNIT/ML SOLOSTAR PEN: 180.0000 [IU] | PEN_INJECTOR | SUBCUTANEOUS | 11 refills | Status: DC

## 2017-08-05 ENCOUNTER — Encounter (INDEPENDENT_AMBULATORY_CARE_PROVIDER_SITE_OTHER): Payer: PPO | Admitting: Ophthalmology

## 2017-08-12 ENCOUNTER — Encounter (INDEPENDENT_AMBULATORY_CARE_PROVIDER_SITE_OTHER): Payer: PPO | Admitting: Ophthalmology

## 2017-08-13 ENCOUNTER — Encounter (INDEPENDENT_AMBULATORY_CARE_PROVIDER_SITE_OTHER): Payer: PPO | Admitting: Ophthalmology

## 2017-08-13 DIAGNOSIS — E113313 Type 2 diabetes mellitus with moderate nonproliferative diabetic retinopathy with macular edema, bilateral: Secondary | ICD-10-CM

## 2017-08-13 DIAGNOSIS — H43813 Vitreous degeneration, bilateral: Secondary | ICD-10-CM

## 2017-08-13 DIAGNOSIS — H35033 Hypertensive retinopathy, bilateral: Secondary | ICD-10-CM | POA: Diagnosis not present

## 2017-08-13 DIAGNOSIS — E11311 Type 2 diabetes mellitus with unspecified diabetic retinopathy with macular edema: Secondary | ICD-10-CM | POA: Diagnosis not present

## 2017-08-13 DIAGNOSIS — I10 Essential (primary) hypertension: Secondary | ICD-10-CM

## 2017-08-20 ENCOUNTER — Encounter: Payer: Self-pay | Admitting: Family Medicine

## 2017-08-20 ENCOUNTER — Ambulatory Visit (INDEPENDENT_AMBULATORY_CARE_PROVIDER_SITE_OTHER): Payer: PPO | Admitting: Family Medicine

## 2017-08-20 VITALS — BP 110/74 | HR 76 | Temp 97.9°F | Ht 72.0 in | Wt 295.0 lb

## 2017-08-20 DIAGNOSIS — E1159 Type 2 diabetes mellitus with other circulatory complications: Secondary | ICD-10-CM

## 2017-08-20 DIAGNOSIS — E1142 Type 2 diabetes mellitus with diabetic polyneuropathy: Secondary | ICD-10-CM | POA: Diagnosis not present

## 2017-08-20 DIAGNOSIS — E785 Hyperlipidemia, unspecified: Secondary | ICD-10-CM | POA: Diagnosis not present

## 2017-08-20 DIAGNOSIS — I739 Peripheral vascular disease, unspecified: Secondary | ICD-10-CM | POA: Diagnosis not present

## 2017-08-20 DIAGNOSIS — I152 Hypertension secondary to endocrine disorders: Secondary | ICD-10-CM

## 2017-08-20 DIAGNOSIS — J301 Allergic rhinitis due to pollen: Secondary | ICD-10-CM | POA: Diagnosis not present

## 2017-08-20 DIAGNOSIS — I10 Essential (primary) hypertension: Secondary | ICD-10-CM | POA: Diagnosis not present

## 2017-08-20 DIAGNOSIS — J309 Allergic rhinitis, unspecified: Secondary | ICD-10-CM | POA: Insufficient documentation

## 2017-08-20 NOTE — Patient Instructions (Addendum)
I would also like for you to sign up for an annual wellness visit after your vacation with one of our nurses, Cassie or Manuela Schwartz, who both specialize in the annual wellness visit. This is a free benefit under medicare that may help [place] find additional ways to help you. Some highlights are reviewing medications, lifestyle, and doing a dementia screen.   No changes today

## 2017-08-20 NOTE — Assessment & Plan Note (Signed)
S: Poorly controlled on on no statin-see labs below.  Livalo was to be started last visit Lab Results  Component Value Date   CHOL 198 08/08/2015   HDL 33.00 (L) 08/08/2015   LDLCALC 127 (H) 08/08/2015   LDLDIRECT 139.0 02/12/2017   TRIG 189.0 (H) 08/08/2015   CHOLHDL 6 08/08/2015   A/P: stressed starting the livalo- he is concerned that he may have to take coq10- advised him to start with livalo alone

## 2017-08-20 NOTE — Assessment & Plan Note (Signed)
Minimal disease from 11/2016- we discussed maximizing medical management IMPRESSION: 1. Minimally reduced resting left ankle-brachial index of 0.95 with segmental analysis demonstrating tibial disease and probable component of mild digital arterial disease. 2. Normal resting right ankle-brachial index of 1.05. Mildly reduced first toe pressure is consistent with probable component of mild digital arterial disease.

## 2017-08-20 NOTE — Progress Notes (Signed)
Subjective:  Martin WOHLER Sr. is a 72 y.o. year old very pleasant male patient who presents for/with See problem oriented charting ROS- No chest pain or shortness of breath. No headache or blurry vision.    Past Medical History-  Patient Active Problem List   Diagnosis Date Noted  . Undiagnosed cardiac murmurs 08/15/2015    Priority: High  . Morbid obesity ([place]) 04/12/2014    Priority: High  . DM (diabetes mellitus) type II uncontrolled with eye manifestation ([place]) 02/03/2007    Priority: High  . Partial tear of right Achilles tendon 02/12/2017    Priority: Medium  . History of skin cancer 08/15/2015    Priority: Medium  . Diabetic retinopathy ([place]) 02/21/2015    Priority: Medium  . Hyperlipidemia 08/24/2014    Priority: Medium  . Diabetic polyneuropathy ([place]) 04/11/2009    Priority: Medium  . Hypertension associated with diabetes ([place]) 02/03/2007    Priority: Medium  . Allergic rhinitis 08/20/2017    Priority: Low  . Former smoker 08/24/2014    Priority: Low  . Ingrowing toenail 07/19/2014    Priority: Low  . BACK PAIN, LUMBAR, WITH RADICULOPATHY 08/30/2008    Priority: Low  . PAD (peripheral artery disease) ([place]) 08/20/2017  . Aortic atherosclerosis ([place]) 10/07/2016  . C. difficile colitis 09/25/2016  . Aspiration pneumonia ([place]) 09/25/2016    Medications- reviewed and updated Current Outpatient Medications  Medication Sig Dispense Refill  . amitriptyline (ELAVIL) 10 MG tablet Take 1 tablet (10 mg total) by mouth at bedtime. 30 tablet 5  . BESIVANCE 0.6 % SUSP Place 1 drop into the left eye 3 (three) times daily.  1  . glucose blood (ONETOUCH VERIO) test strip 1 each by Other route 4 (four) times daily. And lancets 4/day 120 each 12  . Insulin Glargine (LANTUS SOLOSTAR) 100 UNIT/ML Solostar Pen Inject 180 Units into the skin every morning. 25 pen 11  . Insulin Pen Needle (NOVOFINE) 32G X 6 MM MISC USE 5 TIMES DAILY 300 each 3  . ofloxacin (OCUFLOX) 0.3 % ophthalmic  solution Place 1 drop into both eyes. Administer 1 drop in both eyes up to 4 times a day for 2 days after monthly injections  12  . Pitavastatin Calcium 1 MG TABS Take 1 tablet (1 mg total) daily by mouth. 30 tablet 5  . valsartan-hydrochlorothiazide (DIOVAN-HCT) 320-25 MG tablet TAKE 1 TABLET BY MOUTH DAILY. 30 tablet 3   No current facility-administered medications for this visit.     Objective: BP 110/74 (BP Location: Left Arm, Patient Position: Sitting, Cuff Size: Large)   Pulse 76   Temp 97.9 F (36.6 C) (Oral)   Ht 6' (1.829 m)   Wt 295 lb (133.8 kg)   SpO2 98%   BMI 40.01 kg/m  Gen: NAD, resting comfortably CV: RRR no murmurs rubs or gallops Lungs: CTAB no crackles, wheeze, rhonchi Abdomen: soft/nontender/nondistended/normal bowel sounds. obese Ext: no edema Skin: warm, dry Neuro: normal gait and speech- using cane for extra support  Assessment/Plan:  Allergic rhinitis S: wanted to start flonase about a week ago- bought [facility] brand. Symptoms about 3 weeks. Deep cough now improved A/P: I think this is a reasonable choice. Encouraged him to continue at least for the next few weeks  Hypertension associated with diabetes ([place]) S: controlled on valsartan-HCTZ 320-25 mg.  Levels have been elevated while ill-but now improved BP Readings from Last 3 Encounters:  08/20/17 110/74  07/16/17 122/78  05/21/17 (!) 148/88  A/P: We  discussed blood pressure goal of <1 40/90. Continue current meds   Hyperlipidemia S: Poorly controlled on on no statin-see labs below.  Livalo was to be started last visit Lab Results  Component Value Date   CHOL 198 08/08/2015   HDL 33.00 (L) 08/08/2015   LDLCALC 127 (H) 08/08/2015   LDLDIRECT 139.0 02/12/2017   TRIG 189.0 (H) 08/08/2015   CHOLHDL 6 08/08/2015   A/P: stressed starting the livalo- he is concerned that he may have to take coq10- advised him to start with livalo alone   Diabetic polyneuropathy ([place]) S: We had prescribed  amitriptyline 10 mg to see if that would help with his neuropathy-and encouraged trial of 25 mg but he had been ill and not wanted to try this.  He had dizziness in the past 50 mg.  Also had consider Lyrica.  He states gets slightly confused with this 10mg  dose A/P: he opts for sparing ibuprofen (will have to watch bmet) instead of amitriptyline for pain  Morbid obesity ([place]) Down from 325 in 2017. We set a goal of 290 or less by follow up. Encouraged need for healthy eating, regular exercise, weight loss.   PAD (peripheral artery disease) ([place]) Minimal disease from 11/2016- we discussed maximizing medical management IMPRESSION: 1. Minimally reduced resting left ankle-brachial index of 0.95 with segmental analysis demonstrating tibial disease and probable component of mild digital arterial disease. 2. Normal resting right ankle-brachial index of 1.05. Mildly reduced first toe pressure is consistent with probable component of mild digital arterial disease.  Future Appointments  Date Time Provider [place]  09/24/2017  1:30 PM Hayden Pedro, MD [facility] None   Return in about 4 months (around 12/20/2017) for physical. He asked to delay his labs until his physical since he will need lipids. Has been 6 months from cbc, cmp so would have been reasonable to do today.   Return precautions advised.  [name], MD

## 2017-08-20 NOTE — Assessment & Plan Note (Signed)
Down from 325 in 2017. We set a goal of 290 or less by follow up. Encouraged need for healthy eating, regular exercise, weight loss.

## 2017-08-20 NOTE — Assessment & Plan Note (Signed)
S: We had prescribed amitriptyline 10 mg to see if that would help with his neuropathy-and encouraged trial of 25 mg but he had been ill and not wanted to try this.  He had dizziness in the past 50 mg.  Also had consider Lyrica.  He states gets slightly confused with this 10mg  dose A/P: he opts for sparing ibuprofen (will have to watch bmet) instead of amitriptyline for pain

## 2017-08-20 NOTE — Assessment & Plan Note (Signed)
S: controlled on valsartan-HCTZ 320-25 mg.  Levels have been elevated while ill-but now improved BP Readings from Last 3 Encounters:  08/20/17 110/74  07/16/17 122/78  05/21/17 (!) 148/88  A/P: We discussed blood pressure goal of <1 40/90. Continue current meds

## 2017-08-20 NOTE — Assessment & Plan Note (Signed)
S: wanted to start flonase about a week ago- bought [facility] brand. Symptoms about 3 weeks. Deep cough now improved A/P: I think this is a reasonable choice. Encouraged him to continue at least for the next few weeks

## 2017-09-08 ENCOUNTER — Telehealth: Payer: Self-pay | Admitting: Family Medicine

## 2017-09-08 NOTE — Telephone Encounter (Signed)
See note

## 2017-09-08 NOTE — Telephone Encounter (Signed)
Copied from [place] [id number]. Topic: Quick Communication - See Telephone Encounter >> Sep 08, 2017  2:11 PM Robina Ade, Helene Kelp D wrote: CRM for notification. See Telephone encounter for: 09/08/17. Patient wife called and said that pt states that the medication that Dr. Yong Channel gave him is not working and would like something stronger for his sinus infection. Please call patient back if something else gets called in or his wife [phone].

## 2017-09-09 NOTE — Telephone Encounter (Signed)
I do not see that we prescribed an antibiotic.  Are they referencing Flonase that we discussed for allergies?

## 2017-09-10 MED ORDER — AMOXICILLIN-POT CLAVULANATE 875-125 MG PO TABS: 1.0000 | ORAL_TABLET | Freq: Two times a day (BID) | ORAL | 0 refills | Status: AC

## 2017-09-10 NOTE — Telephone Encounter (Signed)
Called patient wife and yes they are talking about the Flonase is not helping, she stated he's been taking it for about 3 weeks now. They would like something stronger sent in to their pharmacy. I informed her I would give her a call back once I send in her medication for him.

## 2017-09-10 NOTE — Telephone Encounter (Signed)
Sent in augmentin for 7 days- antibiotic. If he doesn't improve with this - needs to be scheduled for office visit

## 2017-09-10 NOTE — Telephone Encounter (Signed)
Forgot to forward this

## 2017-09-11 NOTE — Telephone Encounter (Signed)
Called patient and left a voicemail message letting patient know that medication had been sent to the pharmacy. I asked him to either return my call or send a My Chart message to let me know that he had picked up the medication

## 2017-09-17 ENCOUNTER — Other Ambulatory Visit: Payer: Self-pay | Admitting: Family Medicine

## 2017-09-24 ENCOUNTER — Encounter (INDEPENDENT_AMBULATORY_CARE_PROVIDER_SITE_OTHER): Payer: PPO | Admitting: Ophthalmology

## 2017-09-24 DIAGNOSIS — H43813 Vitreous degeneration, bilateral: Secondary | ICD-10-CM

## 2017-09-24 DIAGNOSIS — I10 Essential (primary) hypertension: Secondary | ICD-10-CM | POA: Diagnosis not present

## 2017-09-24 DIAGNOSIS — H35033 Hypertensive retinopathy, bilateral: Secondary | ICD-10-CM

## 2017-09-24 DIAGNOSIS — E11311 Type 2 diabetes mellitus with unspecified diabetic retinopathy with macular edema: Secondary | ICD-10-CM

## 2017-09-24 DIAGNOSIS — H26491 Other secondary cataract, right eye: Secondary | ICD-10-CM | POA: Diagnosis not present

## 2017-09-24 DIAGNOSIS — E113313 Type 2 diabetes mellitus with moderate nonproliferative diabetic retinopathy with macular edema, bilateral: Secondary | ICD-10-CM | POA: Diagnosis not present

## 2017-10-01 ENCOUNTER — Encounter (INDEPENDENT_AMBULATORY_CARE_PROVIDER_SITE_OTHER): Payer: PPO | Admitting: Ophthalmology

## 2017-10-01 DIAGNOSIS — H26491 Other secondary cataract, right eye: Secondary | ICD-10-CM

## 2017-10-07 ENCOUNTER — Encounter: Payer: Self-pay | Admitting: Family Medicine

## 2017-10-07 ENCOUNTER — Ambulatory Visit (INDEPENDENT_AMBULATORY_CARE_PROVIDER_SITE_OTHER): Payer: PPO

## 2017-10-07 ENCOUNTER — Ambulatory Visit (INDEPENDENT_AMBULATORY_CARE_PROVIDER_SITE_OTHER): Payer: PPO | Admitting: Family Medicine

## 2017-10-07 VITALS — BP 120/74 | HR 94 | Temp 97.9°F | Ht 72.0 in | Wt 298.2 lb

## 2017-10-07 DIAGNOSIS — R0989 Other specified symptoms and signs involving the circulatory and respiratory systems: Secondary | ICD-10-CM | POA: Diagnosis not present

## 2017-10-07 DIAGNOSIS — J329 Chronic sinusitis, unspecified: Secondary | ICD-10-CM | POA: Diagnosis not present

## 2017-10-07 DIAGNOSIS — R059 Cough, unspecified: Secondary | ICD-10-CM

## 2017-10-07 DIAGNOSIS — R05 Cough: Secondary | ICD-10-CM

## 2017-10-07 DIAGNOSIS — B9689 Other specified bacterial agents as the cause of diseases classified elsewhere: Secondary | ICD-10-CM

## 2017-10-07 MED ORDER — DOXYCYCLINE HYCLATE 100 MG PO TABS: 100.0000 mg | ORAL_TABLET | Freq: Two times a day (BID) | ORAL | 0 refills | Status: DC

## 2017-10-07 NOTE — Patient Instructions (Addendum)
Doxycycline for 10 days for sinusitis. Levaquin would be more aggressive but with prior tendon issues I want to hold off. Lets check back in if not improved in 14 days or sooner if symptoms worsen particularly fever or shortness of breath

## 2017-10-07 NOTE — Progress Notes (Signed)
Subjective:  Martin MCCLURG Sr. is a 72 y.o. year old very pleasant male patient who presents for/with See problem oriented charting ROS-  Continued cough, nasal congestion and sinus pressure. Cough sounds deep. No shortness of breath or fever   Past Medical History-  Patient Active Problem List   Diagnosis Date Noted  . Undiagnosed cardiac murmurs 08/15/2015    Priority: High  . Morbid obesity ([place]) 04/12/2014    Priority: High  . DM (diabetes mellitus) type II uncontrolled with eye manifestation ([place]) 02/03/2007    Priority: High  . Partial tear of right Achilles tendon 02/12/2017    Priority: Medium  . History of skin cancer 08/15/2015    Priority: Medium  . Diabetic retinopathy ([place]) 02/21/2015    Priority: Medium  . Hyperlipidemia 08/24/2014    Priority: Medium  . Diabetic polyneuropathy ([place]) 04/11/2009    Priority: Medium  . Hypertension associated with diabetes ([place]) 02/03/2007    Priority: Medium  . Allergic rhinitis 08/20/2017    Priority: Low  . Former smoker 08/24/2014    Priority: Low  . Ingrowing toenail 07/19/2014    Priority: Low  . BACK PAIN, LUMBAR, WITH RADICULOPATHY 08/30/2008    Priority: Low  . PAD (peripheral artery disease) ([place]) 08/20/2017  . Aortic atherosclerosis ([place]) 10/07/2016  . C. difficile colitis 09/25/2016  . Aspiration pneumonia ([place]) 09/25/2016    Medications- reviewed and updated Current Outpatient Medications  Medication Sig Dispense Refill  . amitriptyline (ELAVIL) 10 MG tablet TAKE 1 TABLET BY MOUTH EVERYDAY AT BEDTIME 30 tablet 5  . BESIVANCE 0.6 % SUSP Place 1 drop into the left eye 3 (three) times daily.  1  . glucose blood (ONETOUCH VERIO) test strip 1 each by Other route 4 (four) times daily. And lancets 4/day 120 each 12  . ibuprofen (ADVIL,MOTRIN) 200 MG tablet Take 400 mg by mouth every 6 (six) hours as needed.    . Insulin Glargine (LANTUS SOLOSTAR) 100 UNIT/ML Solostar Pen Inject 180 Units into the skin every morning. 25  pen 11  . Insulin Pen Needle (NOVOFINE) 32G X 6 MM MISC USE 5 TIMES DAILY 300 each 3  . ofloxacin (OCUFLOX) 0.3 % ophthalmic solution Place 1 drop into both eyes. Administer 1 drop in both eyes up to 4 times a day for 2 days after monthly injections  12  . Pitavastatin Calcium 1 MG TABS Take 1 tablet (1 mg total) daily by mouth. 30 tablet 5  . valsartan-hydrochlorothiazide (DIOVAN-HCT) 320-25 MG tablet TAKE 1 TABLET BY MOUTH DAILY. 30 tablet 3  . doxycycline (VIBRA-TABS) 100 MG tablet Take 1 tablet (100 mg total) by mouth 2 (two) times daily. 20 tablet 0   No current facility-administered medications for this visit.     Objective: BP 120/74 (BP Location: Left Arm, Patient Position: Sitting, Cuff Size: Large)   Pulse 94   Temp 97.9 F (36.6 C) (Oral)   Ht 6' (1.829 m)   Wt 298 lb 3.2 oz (135.3 kg)   SpO2 96%   BMI 40.44 kg/m  Gen: NAD, resting comfortably, rare cough during visit Mild maxillary sinus tenderness bilaterally.  Nasal turbinates erythematous with some yellow discharge.  Oropharynx largely normal. CV: RRR no murmurs rubs or gallops Lungs: Very faint crackle left lung base-no wheezes or rhonchi Abdomen: soft/nontender/nondistended/obese Ext: no edema Skin: warm, dry  Chest x-ray-no acute cardiopulmonary abnormality.  May have some slight thickening along bronchi-possible bronchitis  Assessment/Plan:  Bacterial sinusitis  Cough - Plan: DG Chest  2 View  S: Patient was seen back in April-he reported deep cough which was improving with Flonase.  Symptoms had started 3 weeks prior.  I encouraged him to continue Flonase since he was improving at that time.  3 weeks later he was not improving-we sent in Augmentin for potential sinus infection as he had continued sinus congestion and pressure.  He states had about 15% improvement on augmentin  Today states has maxillary sinus pressure bilaterally which worened again after finishing augmentin- having to take intermittent  advil for the sinus pain. Gets into coughing fits- thick white or yellow. Nasal discharge is yellow. No breathing issues- sounds like deeper cough but he doesn't feel congested in chest. No fever/chills/shortness of breath.   He is concerned about possible C. Diff- has been doing greek yogurt and probiotics when on antibiotics A/P: Had the faintest of crackles in left lower lobe-got chest x-ray.  I do not see obvious pneumonia but we will await radiology overread.  Most of his symptoms are focused in his sinuses- only had mild improvement in Augmentin- we opted to trial doxycycline instead.  See explanation of avoidance of Levaquin as below.  We could try higher dose Augmentin at next visit if needed-hope this doxycycline clears his infection though From AVS:  " Doxycycline for 10 days for sinusitis. Levaquin would be more aggressive but with prior tendon issues I want to hold off. Lets check back in if not improved in 14 days or sooner if symptoms worsen particularly fever or shortness of breath  Future Appointments  Date Time Provider [place]  11/04/2017  8:30 AM Hayden Pedro, MD [facility] None  11/19/2017 10:00 AM Williemae Area, RN [facility]  12/17/2017  8:15 AM Renato Shin, MD [facility] None  12/24/2017  9:45 AM Yong Channel [name], MD [facility]   Lab/Order associations: Cough - Plan: DG Chest 2 View  Meds ordered this encounter  Medications  . doxycycline (VIBRA-TABS) 100 MG tablet    Sig: Take 1 tablet (100 mg total) by mouth 2 (two) times daily.    Dispense:  20 tablet    Refill:  0    Return precautions advised.  [name], MD

## 2017-10-09 ENCOUNTER — Telehealth: Payer: Self-pay | Admitting: Endocrinology

## 2017-10-09 NOTE — Telephone Encounter (Signed)
-----   Message from Dorna Leitz, [place] sent at 10/07/2017 10:23 AM EDT ----- Regarding: Humalog Patient has been tracking his blood sugars & states that he is spiking after meals. He had some left over humulog insulin & has been taking 30 units at meals. He said that his blood sugars are much better & would like to be put back on humalog if ok with you? He is still taking the 180 of Lantus, although he initially stated he was only taking 160. Please advise?

## 2017-10-09 NOTE — Telephone Encounter (Signed)
This would depend on a1c.  Ov is overdue.  Please move up next ov to next available.

## 2017-10-10 ENCOUNTER — Other Ambulatory Visit: Payer: Self-pay | Admitting: Endocrinology

## 2017-10-10 NOTE — Telephone Encounter (Signed)
I have called patient LVM for him to call back to make next available OV.

## 2017-10-14 ENCOUNTER — Other Ambulatory Visit: Payer: Self-pay | Admitting: Family Medicine

## 2017-11-04 ENCOUNTER — Encounter (INDEPENDENT_AMBULATORY_CARE_PROVIDER_SITE_OTHER): Payer: PPO | Admitting: Ophthalmology

## 2017-11-11 DIAGNOSIS — J309 Allergic rhinitis, unspecified: Secondary | ICD-10-CM | POA: Diagnosis not present

## 2017-11-11 DIAGNOSIS — J0140 Acute pansinusitis, unspecified: Secondary | ICD-10-CM | POA: Diagnosis not present

## 2017-11-19 ENCOUNTER — Encounter (INDEPENDENT_AMBULATORY_CARE_PROVIDER_SITE_OTHER): Payer: PPO | Admitting: Ophthalmology

## 2017-11-19 ENCOUNTER — Ambulatory Visit: Payer: PPO

## 2017-12-02 ENCOUNTER — Encounter (INDEPENDENT_AMBULATORY_CARE_PROVIDER_SITE_OTHER): Payer: PPO | Admitting: Ophthalmology

## 2017-12-02 DIAGNOSIS — H35033 Hypertensive retinopathy, bilateral: Secondary | ICD-10-CM | POA: Diagnosis not present

## 2017-12-02 DIAGNOSIS — I10 Essential (primary) hypertension: Secondary | ICD-10-CM

## 2017-12-02 DIAGNOSIS — E11311 Type 2 diabetes mellitus with unspecified diabetic retinopathy with macular edema: Secondary | ICD-10-CM

## 2017-12-02 DIAGNOSIS — H43813 Vitreous degeneration, bilateral: Secondary | ICD-10-CM

## 2017-12-02 DIAGNOSIS — E113313 Type 2 diabetes mellitus with moderate nonproliferative diabetic retinopathy with macular edema, bilateral: Secondary | ICD-10-CM | POA: Diagnosis not present

## 2017-12-04 ENCOUNTER — Other Ambulatory Visit: Payer: Self-pay | Admitting: Family Medicine

## 2017-12-04 ENCOUNTER — Other Ambulatory Visit: Payer: Self-pay | Admitting: Endocrinology

## 2017-12-09 ENCOUNTER — Encounter (INDEPENDENT_AMBULATORY_CARE_PROVIDER_SITE_OTHER): Payer: PPO | Admitting: Ophthalmology

## 2017-12-09 DIAGNOSIS — H43813 Vitreous degeneration, bilateral: Secondary | ICD-10-CM | POA: Diagnosis not present

## 2017-12-09 DIAGNOSIS — H35033 Hypertensive retinopathy, bilateral: Secondary | ICD-10-CM | POA: Diagnosis not present

## 2017-12-09 DIAGNOSIS — E113313 Type 2 diabetes mellitus with moderate nonproliferative diabetic retinopathy with macular edema, bilateral: Secondary | ICD-10-CM

## 2017-12-09 DIAGNOSIS — E11311 Type 2 diabetes mellitus with unspecified diabetic retinopathy with macular edema: Secondary | ICD-10-CM

## 2017-12-09 DIAGNOSIS — I10 Essential (primary) hypertension: Secondary | ICD-10-CM

## 2017-12-17 ENCOUNTER — Ambulatory Visit (INDEPENDENT_AMBULATORY_CARE_PROVIDER_SITE_OTHER): Payer: PPO | Admitting: Endocrinology

## 2017-12-17 ENCOUNTER — Encounter: Payer: Self-pay | Admitting: Endocrinology

## 2017-12-17 VITALS — BP 162/82 | HR 92 | Ht 72.0 in | Wt 302.2 lb

## 2017-12-17 DIAGNOSIS — E1139 Type 2 diabetes mellitus with other diabetic ophthalmic complication: Secondary | ICD-10-CM

## 2017-12-17 DIAGNOSIS — IMO0002 Reserved for concepts with insufficient information to code with codable children: Secondary | ICD-10-CM

## 2017-12-17 DIAGNOSIS — E1165 Type 2 diabetes mellitus with hyperglycemia: Secondary | ICD-10-CM

## 2017-12-17 LAB — POCT GLYCOSYLATED HEMOGLOBIN (HGB A1C): Hemoglobin A1C: 8.2 % — AB (ref 4.0–5.6)

## 2017-12-17 MED ORDER — INSULIN GLARGINE 100 UNIT/ML SOLOSTAR PEN: 150.0000 [IU] | PEN_INJECTOR | SUBCUTANEOUS | 11 refills | Status: DC

## 2017-12-17 MED ORDER — INSULIN ASPART 100 UNIT/ML FLEXPEN: 20.0000 [IU] | PEN_INJECTOR | Freq: Two times a day (BID) | SUBCUTANEOUS | 11 refills | Status: DC

## 2017-12-17 NOTE — Patient Instructions (Addendum)
Your blood pressure is high today.  Please see your primary care provider soon, to have it rechecked check your blood sugar 4 times a day.  vary the time of day when you check, between before the 3 meals, and at bedtime.  also check if you have symptoms of your blood sugar being too high or too low.  please keep a record of the readings and bring it to your next appointment here.  You can write it on any piece of paper.  please call us sooner if your blood sugar goes below 70, or if you have a lot of readings over 200.   Please reduce the lantus to 150 units each morning, and: Add "novolog" 20 units twice a day (just before each meal) please come back for a follow-up appointment in 2 months.

## 2017-12-17 NOTE — Progress Notes (Signed)
Subjective:    Patient ID: Martin Agee Sr., male    DOB: [birth year], 72 y.o.   MRN: [id number]  HPI Pt returns for f/u of diabetes mellitus: DM type: Insulin-requiring type 2.  Dx'ed: 5035 Complications: polyneuropathy, foot ulcer, renal insuff, and retinopathy.  Therapy: insulin since 2013.  DKA: never.   Severe hypoglycemia: never.  Pancreatitis: never.   Other: he chose to change to a qd insulin regimen, after poor results with multiple daily injections; he declines weight loss surgery.   Interval history: He brings a record of his cbg's which I have reviewed today.  It varies from 70-240.  It is in general higher as the day goes on.  He takes insulin as rx'ed.  Pt says he eats 2 meals per day.  Past Medical History:  Diagnosis Date  . Diabetes mellitus   . Hypertension     Past Surgical History:  Procedure Laterality Date  . HERNIA REPAIR     >10 years ago  . IR RADIOLOGIST EVAL & MGMT  12/24/2016  . right shoulder surgery     around 2014    Social History   Socioeconomic History  . Marital status: Married    Spouse name: Not on file  . Number of children: Not on file  . Years of education: Not on file  . Highest education level: Not on file  Occupational History  . Not on file  Social Needs  . Financial resource strain: Not on file  . Food insecurity:    Worry: Not on file    Inability: Not on file  . Transportation needs:    Medical: Not on file    Non-medical: Not on file  Tobacco Use  . Smoking status: Former Smoker    Last attempt to quit: 04/29/2000    Years since quitting: 17.6  . Smokeless tobacco: Never Used  . Tobacco comment: smoked for 10 years on and off  Substance and Sexual Activity  . Alcohol use: Yes    Alcohol/week: 0.0 standard drinks    Comment: 6 martinis a year  . Drug use: No  . Sexual activity: Yes  Lifestyle  . Physical activity:    Days per week: Not on file    Minutes per session: Not on file  . Stress: Not on file    Relationships  . Social connections:    Talks on phone: Not on file    Gets together: Not on file    Attends religious service: Not on file    Active member of club or organization: Not on file    Attends meetings of clubs or organizations: Not on file    Relationship status: Not on file  . Intimate partner violence:    Fear of current or ex partner: Not on file    Emotionally abused: Not on file    Physically abused: Not on file    Forced sexual activity: Not on file  Other Topics Concern  . Not on file  Social History Narrative   Married. 3 kids. 7 grandkids. No greatgrandkids.       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 amitriptyline (ELAVIL) 10 MG tablet TAKE 1 TABLET BY MOUTH EVERYDAY AT BEDTIME 90 tablet 2  . Azelastine HCl 0.15 % SOLN SPRAY 2 SPRAYS INTO [facility]  NOSTRIL EVERY DAY  2  . BESIVANCE 0.6 % SUSP Place 1 drop into the left eye 3 (three) times daily.  1  . brimonidine (ALPHAGAN) 0.2 % ophthalmic solution     . ibuprofen (ADVIL,MOTRIN) 200 MG tablet Take 400 mg by mouth every 6 (six) hours as needed.    . Insulin Pen Needle (NOVOFINE) 32G X 6 MM MISC USE 5 TIMES DAILY 300 each 3  . ofloxacin (OCUFLOX) 0.3 % ophthalmic solution Place 1 drop into both eyes. Administer 1 drop in both eyes up to 4 times a day for 2 days after monthly injections  12  . ONETOUCH VERIO test strip CHECK LEVELS 4 TIMES DAILY 150 each 1  . Pitavastatin Calcium 1 MG TABS Take 1 tablet (1 mg total) daily by mouth. 30 tablet 5  . valsartan-hydrochlorothiazide (DIOVAN-HCT) 320-25 MG tablet TAKE 1 TABLET BY MOUTH DAILY. 30 tablet 3   No current facility-administered medications on file prior to visit.     Allergies  Allergen Reactions  . Simvastatin Diarrhea    Family History  Problem Relation Age of Onset  . Hypertension  Mother   . Heart disease Mother        CHF did not see doctor  . Diabetes Maternal Grandmother   . Diabetes Son     BP (!) 162/82 (BP Location: Right Arm, Patient Position: Sitting, Cuff Size: Normal)   Pulse 92   Ht 6' (1.829 m)   Wt (!) 302 lb 3.2 oz (137.1 kg)   SpO2 97%   BMI 40.99 kg/m    Review of Systems He denies hypoglycemia    Objective:   Physical Exam VITAL SIGNS: See vs page.    GENERAL: no distress.   Pulses: dorsalis pedis intact bilat. MSK: no deformity of the feet.  CV: 2+ bilat leg edema.  Skin: healed ulcer at the left great toe. normal temp on the feet. Mild erythema of the legs.   Neuro: sensation is intact to touch on the feet, but severely decreased from normal.  Ext: There is severe bilateral onychomycosis of the toenails.    Lab Results  Component Value Date   HGBA1C 8.2 (A) 12/17/2017     Lab Results  Component Value Date   CREATININE 1.23 02/12/2017   BUN 22 02/12/2017   NA 138 02/12/2017   K 4.8 02/12/2017   CL 100 02/12/2017   CO2 26 02/12/2017       Assessment & Plan:  Insulin-requiring type 2 DM, with DR: he needs increased rx.  we discussed.   Renal insuff: he would be better treated with multiple daily injections.  He agrees to try  Patient Instructions  Your blood pressure is high today.  Please see your primary care provider soon, to have it rechecked check your blood sugar 4 times a day.  vary the time of day when you check, between before the 3 meals, and at bedtime.  also check if you have symptoms of your blood sugar being too high or too low.  please keep a record of the readings and bring it to your next appointment here.  You can write it on any piece of paper.  please call us sooner if your blood sugar goes below 70, or if you have a lot of readings over 200.   Please reduce the lantus to 150 units each morning, and: Add "novolog" 20 units twice a day (just before each meal) please come back for a follow-up  appointment  in 2 months.

## 2017-12-24 ENCOUNTER — Encounter: Payer: Self-pay | Admitting: Family Medicine

## 2017-12-24 ENCOUNTER — Ambulatory Visit (INDEPENDENT_AMBULATORY_CARE_PROVIDER_SITE_OTHER): Payer: PPO | Admitting: Family Medicine

## 2017-12-24 VITALS — BP 116/70 | HR 87 | Temp 98.0°F | Ht 71.5 in | Wt 300.0 lb

## 2017-12-24 DIAGNOSIS — E1165 Type 2 diabetes mellitus with hyperglycemia: Secondary | ICD-10-CM

## 2017-12-24 DIAGNOSIS — E538 Deficiency of other specified B group vitamins: Secondary | ICD-10-CM | POA: Diagnosis not present

## 2017-12-24 DIAGNOSIS — I700 Atherosclerosis of aorta: Secondary | ICD-10-CM

## 2017-12-24 DIAGNOSIS — Z0000 Encounter for general adult medical examination without abnormal findings: Secondary | ICD-10-CM

## 2017-12-24 DIAGNOSIS — E1142 Type 2 diabetes mellitus with diabetic polyneuropathy: Secondary | ICD-10-CM

## 2017-12-24 DIAGNOSIS — E1139 Type 2 diabetes mellitus with other diabetic ophthalmic complication: Secondary | ICD-10-CM | POA: Diagnosis not present

## 2017-12-24 DIAGNOSIS — E11311 Type 2 diabetes mellitus with unspecified diabetic retinopathy with macular edema: Secondary | ICD-10-CM

## 2017-12-24 DIAGNOSIS — I10 Essential (primary) hypertension: Secondary | ICD-10-CM

## 2017-12-24 DIAGNOSIS — IMO0002 Reserved for concepts with insufficient information to code with codable children: Secondary | ICD-10-CM

## 2017-12-24 DIAGNOSIS — E785 Hyperlipidemia, unspecified: Secondary | ICD-10-CM | POA: Diagnosis not present

## 2017-12-24 DIAGNOSIS — I723 Aneurysm of iliac artery: Secondary | ICD-10-CM | POA: Diagnosis not present

## 2017-12-24 DIAGNOSIS — E1159 Type 2 diabetes mellitus with other circulatory complications: Secondary | ICD-10-CM

## 2017-12-24 DIAGNOSIS — I739 Peripheral vascular disease, unspecified: Secondary | ICD-10-CM | POA: Diagnosis not present

## 2017-12-24 DIAGNOSIS — Z1211 Encounter for screening for malignant neoplasm of colon: Secondary | ICD-10-CM

## 2017-12-24 DIAGNOSIS — Z87891 Personal history of nicotine dependence: Secondary | ICD-10-CM

## 2017-12-24 DIAGNOSIS — I152 Hypertension secondary to endocrine disorders: Secondary | ICD-10-CM

## 2017-12-24 LAB — POC URINALSYSI DIPSTICK (AUTOMATED)
Bilirubin, UA: NEGATIVE
Blood, UA: NEGATIVE
Glucose, UA: NEGATIVE
KETONES UA: NEGATIVE
Leukocytes, UA: NEGATIVE
Nitrite, UA: NEGATIVE
PH UA: 5.5 (ref 5.0–8.0)
PROTEIN UA: POSITIVE — AB
SPEC GRAV UA: 1.025 (ref 1.010–1.025)
Urobilinogen, UA: 0.2 E.U./dL

## 2017-12-24 LAB — LIPID PANEL
CHOL/HDL RATIO: 6
Cholesterol: 218 mg/dL — ABNORMAL HIGH (ref 0–200)
HDL: 35.8 mg/dL — ABNORMAL LOW (ref >39.00)
LDL Cholesterol: 153 mg/dL — ABNORMAL HIGH (ref 0–99)
NonHDL: 182
Triglycerides: 144 mg/dL (ref 0.0–149.0)
VLDL: 28.8 mg/dL (ref 0.0–40.0)

## 2017-12-24 LAB — COMPREHENSIVE METABOLIC PANEL
ALT: 18 U/L (ref 0–53)
AST: 18 U/L (ref 0–37)
Albumin: 4.5 g/dL (ref 3.5–5.2)
Alkaline Phosphatase: 48 U/L (ref 39–117)
BUN: 18 mg/dL (ref 6–23)
CHLORIDE: 101 meq/L (ref 96–112)
CO2: 26 meq/L (ref 19–32)
CREATININE: 1.33 mg/dL (ref 0.40–1.50)
Calcium: 9.3 mg/dL (ref 8.4–10.5)
GFR: 55.96 mL/min — ABNORMAL LOW (ref >60.00)
GLUCOSE: 186 mg/dL — AB (ref 70–99)
Potassium: 5 mEq/L (ref 3.5–5.1)
SODIUM: 137 meq/L (ref 135–145)
Total Bilirubin: 1 mg/dL (ref 0.2–1.2)
Total Protein: 7.5 g/dL (ref 6.0–8.3)

## 2017-12-24 LAB — CBC WITH DIFFERENTIAL/PLATELET
BASOS PCT: 0.3 % (ref 0.0–3.0)
Basophils Absolute: 0 10*3/uL (ref 0.0–0.1)
EOS ABS: 0.2 10*3/uL (ref 0.0–0.7)
Eosinophils Relative: 2.8 % (ref 0.0–5.0)
HCT: 41.3 % (ref 39.0–52.0)
Hemoglobin: 13.6 g/dL (ref 13.0–17.0)
Lymphocytes Relative: 28.9 % (ref 12.0–46.0)
Lymphs Abs: 2.5 10*3/uL (ref 0.7–4.0)
MCHC: 32.9 g/dL (ref 30.0–36.0)
MCV: 90.6 fl (ref 78.0–100.0)
MONO ABS: 0.6 10*3/uL (ref 0.1–1.0)
Monocytes Relative: 6.8 % (ref 3.0–12.0)
NEUTROS ABS: 5.4 10*3/uL (ref 1.4–7.7)
NEUTROS PCT: 61.2 % (ref 43.0–77.0)
PLATELETS: 287 10*3/uL (ref 150.0–400.0)
RBC: 4.56 Mil/uL (ref 4.22–5.81)
RDW: 14.1 % (ref 11.5–15.5)
WBC: 8.8 10*3/uL (ref 4.0–10.5)

## 2017-12-24 LAB — VITAMIN B12: VITAMIN B 12: 306 pg/mL (ref 211–911)

## 2017-12-24 MED ORDER — AZITHROMYCIN 250 MG PO TABS: ORAL_TABLET | ORAL | 0 refills | Status: DC

## 2017-12-24 MED ORDER — PITAVASTATIN CALCIUM 1 MG PO TABS: 1.0000 mg | ORAL_TABLET | Freq: Every day | ORAL | 11 refills | Status: DC

## 2017-12-24 NOTE — Patient Instructions (Addendum)
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 If you do not hear within 3 weeks, give [place] a call.   Try z- pack. If not better in 10 days or worsen- let me refer you to ENT

## 2017-12-24 NOTE — Addendum Note (Signed)
Addended by: Marian Sorrow on: 12/24/2017 10:45 AM   Modules accepted: Orders

## 2017-12-24 NOTE — Progress Notes (Signed)
Phone: [phone]  Subjective:  Patient presents today for their annual physical. Chief complaint-noted.   See problem oriented charting- ROS- full  review of systems was completed and negative except for sinus congestion and pressure, left ear fullness, hard of hearing left ear   The following were reviewed and entered/updated in epic: Past Medical History:  Diagnosis Date  . Diabetes mellitus   . Hypertension    Patient Active Problem List   Diagnosis Date Noted  . Undiagnosed cardiac murmurs 08/15/2015    Priority: High  . Morbid obesity ([place]) 04/12/2014    Priority: High  . DM (diabetes mellitus) type II uncontrolled with eye manifestation ([place]) 02/03/2007    Priority: High  . Partial tear of right Achilles tendon 02/12/2017    Priority: Medium  . History of skin cancer 08/15/2015    Priority: Medium  . Diabetic retinopathy ([place]) 02/21/2015    Priority: Medium  . Hyperlipidemia 08/24/2014    Priority: Medium  . Diabetic polyneuropathy ([place]) 04/11/2009    Priority: Medium  . Hypertension associated with diabetes ([place]) 02/03/2007    Priority: Medium  . Allergic rhinitis 08/20/2017    Priority: Low  . Former smoker 08/24/2014    Priority: Low  . Ingrowing toenail 07/19/2014    Priority: Low  . BACK PAIN, LUMBAR, WITH RADICULOPATHY 08/30/2008    Priority: Low  . PAD (peripheral artery disease) ([place]) 08/20/2017  . Aortic atherosclerosis ([place]) 10/07/2016  . C. difficile colitis 09/25/2016  . Aspiration pneumonia ([place]) 09/25/2016   Past Surgical History:  Procedure Laterality Date  . HERNIA REPAIR     >10 years ago  . IR RADIOLOGIST EVAL & MGMT  12/24/2016  . right shoulder surgery     around 2014    Family History  Problem Relation Age of Onset  . Hypertension Mother   . Heart disease Mother        CHF did not see doctor  . Diabetes Maternal Grandmother   . Diabetes Son     Medications- reviewed and updated Current Outpatient Medications  Medication  Sig Dispense Refill  . BESIVANCE 0.6 % SUSP Place 1 drop into the left eye 3 (three) times daily.  1  . brimonidine (ALPHAGAN) 0.2 % ophthalmic solution     . ibuprofen (ADVIL,MOTRIN) 200 MG tablet Take 400 mg by mouth every 6 (six) hours as needed.    . insulin aspart (NOVOLOG FLEXPEN) 100 UNIT/ML FlexPen Inject 20 Units into the skin 2 (two) times daily with a meal. And pen needles 3/day 15 mL 11  . Insulin Glargine (LANTUS SOLOSTAR) 100 UNIT/ML Solostar Pen Inject 150 Units into the skin every morning. 20 pen 11  . Insulin Pen Needle (NOVOFINE) 32G X 6 MM MISC USE 5 TIMES DAILY 300 each 3  . ofloxacin (OCUFLOX) 0.3 % ophthalmic solution Place 1 drop into both eyes. Administer 1 drop in both eyes up to 4 times a day for 2 days after monthly injections  12  . ONETOUCH VERIO test strip CHECK LEVELS 4 TIMES DAILY 150 each 1  . valsartan-hydrochlorothiazide (DIOVAN-HCT) 320-25 MG tablet TAKE 1 TABLET BY MOUTH DAILY. 30 tablet 3  . amitriptyline (ELAVIL) 10 MG tablet TAKE 1 TABLET BY MOUTH EVERYDAY AT BEDTIME (Patient not taking: Reported on 12/24/2017) 90 tablet 2  . Azelastine HCl 0.15 % SOLN SPRAY 2 SPRAYS INTO EACH NOSTRIL EVERY DAY  2  . azithromycin (ZITHROMAX) 250 MG tablet Take 2 tabs on day 1, then 1 tab daily until  finished 6 tablet 0  . Pitavastatin Calcium 1 MG TABS Take 1 tablet (1 mg total) by mouth daily. 30 tablet 11   No current facility-administered medications for this visit.     Allergies-reviewed and updated Allergies  Allergen Reactions  . Simvastatin Diarrhea    Social History   Social History Narrative   Married. 3 kids. 7 grandkids. No greatgrandkids.       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 Oropharynx normal. Some amber discoloration behind left TM. Sinus pressure bilaterally- some yellow discharge Neck: no thyromegaly CV: RRR no murmurs rubs or gallops Lungs: CTAB no crackles, wheeze, rhonchi Abdomen: soft/nontender/nondistended/normal bowel sounds. Morbid obesity noted Ext: no edema Skin: warm, dry Neuro: grossly normal, moves all extremities, PERRLA  Assessment/Plan:  72 y.o. male presenting for annual physical.  Health Maintenance counseling: 1. Anticipatory guidance: Patient counseled regarding regular dental exams - at least q6 months, eye exams - yearly but he is due nowyearly, wearing seatbelts.  2. Risk factor reduction:  Advised patient of need for regular exercise and diet rich and fruits and vegetables to reduce risk of heart attack and stroke. Exercise- walking some around farm- discussed increasing exercise. Diet-Weight up 5 lbs from last visit, admits to eating poorly. Encouraged healthier eating.  Wt Readings from Last 3 Encounters:  12/24/17 300 lb (136.1 kg)  12/17/17 (!) 302 lb 3.2 oz (137.1 kg)  10/07/17 298 lb 3.2 oz (135.3 kg)  3. Immunizations/screenings/ancillary studies- advised fall flu shot- high dose. Discussed shingrix option - will hold off as feeling ill  Immunization History  Administered Date(s) Administered  . Influenza Split 02/06/2011, 01/28/2012  . Influenza Whole 02/08/2010  . Influenza, High Dose Seasonal PF 02/14/2016, 02/12/2017  . Influenza,inj,Quad PF,6+ Mos 02/21/2015  . Pneumococcal Conjugate-13 08/24/2014  . Pneumococcal Polysaccharide-23 07/18/2008, 08/15/2015  . Td 04/29/2004  . Tdap 08/24/2014  . Zoster 02/08/2010  4. Prostate cancer screening-  Low risk based off prior PSA trend. Passed age recommended screening per USPTF and no significant symptom change so will not update.  Lab Results  Component Value Date   PSA 0.38 08/08/2015   PSA  0.42 04/05/2014   PSA 0.28 09/02/2012   5. Colon cancer screening - 09/22/2007. No polyps or family history. Will get him set up with cologuard.  6. Skin cancer screening- skin surgery center every 6 months. advised regular sunscreen use. Denies worrisome, changing, or new skin lesions.  7. Former smoker. AAA screen on 08/24/15. 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 Get UA   Status of chronic or acute concerns   Congestion still going on since April. 10th day that left ear is blocked up. Prior trial augmentin with minimal benefit. Put him on doxycycline 10 days and he states minimal improvmeent. [facility] and given amoxicillin and astelin. Levaquin with tendon issues plus the aneurysm issue. No fever, no shortness of breath. takign claritin now. Still dealing with a lot of drainage. Possible OME  left associated with ongoing sinusitis.  - has had success with azithromycin in past- if fails this refer to ENT (still want to avoid levaquin with aneurysm history and history tendon issues)   Seeing Dr. Loanne Drilling for diabetes- last # above goal. They are working through this Lab Results  Component Value Date   HGBA1C 8.2 (A) 12/17/2017  Retinopathy- followed by optho Very mild anemia- needs colonoscopy- update CBC OA- doing some aleve for L knee and hip Neuropathy- he has opted for sparing ibuprofen (have to watch bmet) instead of amitriptyline (had been on in the past) - uses sparingly  Hyperlipidemia- mild poor control. Has had diarrhea on simvastatin, does not like atorvastatin for unclear reasons, myalgias on daily crestor 5mg . We offered livalo- he is worried about myalgias. He is willing to trial livalo after discussion today.   Essential hypertension- controlled on valsartan-hctz 100-25mg . Poor control at outside offices. At home can be elevated as well  Undiagnosed cardiac murmurs- noted in  past. Echo 05/29/16 without obvious cause  PAD- minimal- no complications  Aortic atherosclerosis- work on risk factor modification  Future Appointments  Date Time Provider [place]  02/03/2018  8:15 AM Hayden Pedro, MD [facility] None  02/18/2018  8:15 AM Renato Shin, MD [id number] None   No follow-ups on file.  Lab/Order associations: Preventative health care  Aneurysm of external iliac artery (HCC) - Plan: VAS [place] AAA DUPLEX  Morbid obesity ([place])  DM (diabetes mellitus) type II uncontrolled with eye manifestation ([place]) - Plan: CBC with Differential/Platelet, Comprehensive metabolic panel, Lipid panel, POCT Urinalysis Dipstick (Automated)  Diabetic retinopathy of both eyes with macular edema associated with type 2 diabetes mellitus, unspecified retinopathy severity (HCC)  Diabetic polyneuropathy associated with type 2 diabetes mellitus (HCC)  PAD (peripheral artery disease) ([place])  Aortic atherosclerosis ([place])  Hypertension associated with diabetes ([place])  Former smoker  Hyperlipidemia, unspecified hyperlipidemia type - Plan: CBC with Differential/Platelet, Comprehensive metabolic panel, Lipid panel, POCT Urinalysis Dipstick (Automated)  Low serum vitamin B12 - Plan: Vitamin B12  Meds ordered this encounter  Medications  . Pitavastatin Calcium 1 MG TABS    Sig: Take 1 tablet (1 mg total) by mouth daily.    Dispense:  30 tablet    Refill:  11  . azithromycin (ZITHROMAX) 250 MG tablet    Sig: Take 2 tabs on day 1, then 1 tab daily until finished    Dispense:  6 tablet    Refill:  0   Return precautions advised.  [name], MD

## 2017-12-24 NOTE — Addendum Note (Signed)
Addended by: Frutoso Chase A on: 12/24/2017 11:05 AM   Modules accepted: Orders

## 2017-12-25 ENCOUNTER — Telehealth: Payer: Self-pay | Admitting: Family Medicine

## 2017-12-25 NOTE — Telephone Encounter (Signed)
Copied from [place] [id number]. Topic: Quick Communication - Lab Results >> Dec 24, 2017  3:45 PM Zellmer, Clearnce Sorrel, CMA wrote: Called patient to inform them of 8/28 lab results. When patient returns call, triage nurse may disclose results.   [phone]

## 2017-12-25 NOTE — Telephone Encounter (Signed)
Patient notified of test results 

## 2017-12-31 ENCOUNTER — Other Ambulatory Visit: Payer: Self-pay | Admitting: Endocrinology

## 2018-01-01 ENCOUNTER — Telehealth: Payer: Self-pay | Admitting: Family Medicine

## 2018-01-01 NOTE — Telephone Encounter (Signed)
Copied from [place]. Topic: Quick Communication - See Telephone Encounter >> Jan 01, 2018  2:54 PM Gardiner Ramus wrote: CRM for notification. See Telephone encounter for: 01/01/18.pt would like a referral to ENT ( Dr. Leonides Sake. Lucia Gaskins, MD) for fluid in left ear. Please advise. Please fax Fax [phone]

## 2018-01-02 NOTE — Telephone Encounter (Signed)
Yes thanks, may refer 

## 2018-01-02 NOTE — Telephone Encounter (Signed)
Please advise on referral

## 2018-01-05 ENCOUNTER — Other Ambulatory Visit: Payer: Self-pay

## 2018-01-05 DIAGNOSIS — H6592 Unspecified nonsuppurative otitis media, left ear: Secondary | ICD-10-CM

## 2018-01-05 NOTE — Telephone Encounter (Signed)
Referral placed as requested.

## 2018-01-06 ENCOUNTER — Other Ambulatory Visit (HOSPITAL_COMMUNITY): Payer: PPO

## 2018-01-06 DIAGNOSIS — L570 Actinic keratosis: Secondary | ICD-10-CM | POA: Diagnosis not present

## 2018-01-06 DIAGNOSIS — D485 Neoplasm of uncertain behavior of skin: Secondary | ICD-10-CM | POA: Diagnosis not present

## 2018-01-06 DIAGNOSIS — C44329 Squamous cell carcinoma of skin of other parts of face: Secondary | ICD-10-CM | POA: Diagnosis not present

## 2018-01-06 DIAGNOSIS — L821 Other seborrheic keratosis: Secondary | ICD-10-CM | POA: Diagnosis not present

## 2018-01-06 DIAGNOSIS — D1801 Hemangioma of skin and subcutaneous tissue: Secondary | ICD-10-CM | POA: Diagnosis not present

## 2018-01-06 DIAGNOSIS — Z85828 Personal history of other malignant neoplasm of skin: Secondary | ICD-10-CM | POA: Diagnosis not present

## 2018-01-07 DIAGNOSIS — H906 Mixed conductive and sensorineural hearing loss, bilateral: Secondary | ICD-10-CM | POA: Diagnosis not present

## 2018-01-07 DIAGNOSIS — H6522 Chronic serous otitis media, left ear: Secondary | ICD-10-CM | POA: Diagnosis not present

## 2018-01-07 DIAGNOSIS — H903 Sensorineural hearing loss, bilateral: Secondary | ICD-10-CM | POA: Diagnosis not present

## 2018-01-08 ENCOUNTER — Other Ambulatory Visit (HOSPITAL_COMMUNITY): Payer: PPO

## 2018-01-13 ENCOUNTER — Ambulatory Visit (HOSPITAL_COMMUNITY)
Admission: RE | Admit: 2018-01-13 | Discharge: 2018-01-13 | Disposition: A | Discharge status: Home / Self Care | Payer: PPO | Source: Ambulatory Visit | Attending: Cardiology | Admitting: Cardiology

## 2018-01-13 DIAGNOSIS — I723 Aneurysm of iliac artery: Secondary | ICD-10-CM | POA: Diagnosis not present

## 2018-01-16 ENCOUNTER — Other Ambulatory Visit: Payer: Self-pay

## 2018-01-16 NOTE — Patient Outreach (Signed)
[facility]) Care Management  01/16/2018  Martin Sr. 06-19-1945 [id number]   Telephone Screen  Referral Date: 01/16/18 Referral Source: HTA Concierge Referral Reason: " member requesting assistance due to coverage gap" Insurance: HTA    Outreach attempt #1  to patient. No answer at present and unable to leave message.      Plan: RN CM will make outreach attempt to patient within 3-4 business days.  RN CM will send unsuccessful outreach letter to patient.    Enzo Montgomery, RN,BSN,CCM [place] Management Telephonic Care Management Coordinator Direct Phone: [phone] Toll Free: [phone] Fax: [phone]

## 2018-01-21 ENCOUNTER — Other Ambulatory Visit: Payer: Self-pay

## 2018-01-21 NOTE — Patient Outreach (Signed)
[place] [facility]) Care Management  01/21/2018  [place] Sr. 08/01/1945 [id number]   Telephone Screen  Referral Date: 01/16/18 Referral Source: HTA Concierge Referral Reason: " member requesting assistance due to coverage gap" Insurance: HTA   Outreach attempt #2 to patient. No answer at present and unable to leave message.   Plan: RN CM will make outreach attempt to patient within 3-4 business days.   Enzo Montgomery, RN,BSN,CCM [place] Management Telephonic Care Management Coordinator Direct Phone: [phone] Toll Free: [phone] Fax: [phone]

## 2018-01-23 ENCOUNTER — Other Ambulatory Visit: Payer: Self-pay

## 2018-01-23 NOTE — Patient Outreach (Signed)
[place] [facility]) Care Management  01/23/2018  [place] Sr. 10/21/1945 [id number]   Telephone Screen  Referral Date:01/16/18 Referral Source:HTA Concierge Referral Reason:" member requesting assistance due to coverage gap" Insurance:HTA   Outreach attempt #3 to patient. No answer at present and unable to leave message.     Plan: RN CM will close case if no response from letter mailed to patient.   Enzo Montgomery, RN,BSN,CCM [place] Management Telephonic Care Management Coordinator Direct Phone: [phone] Toll Free: [phone] Fax: [phone]

## 2018-01-25 ENCOUNTER — Other Ambulatory Visit: Payer: Self-pay | Admitting: Endocrinology

## 2018-01-30 ENCOUNTER — Other Ambulatory Visit: Payer: Self-pay

## 2018-01-30 NOTE — Patient Outreach (Signed)
[place] [facility]) Care Management  01/30/2018  Martin [name] [place] Sr. Feb 09, 1946 [id number]   Telephone Screen  Referral Date:01/16/18 Referral Source:HTA Concierge Referral Reason:" member requesting assistance due to coverage gap" Insurance:HTA     Multiple attempts to establish contact with patient without success. No response from letter mailed to patient. Case is being closed at this time.     Plan: RN CM will close case at this time.   Enzo Montgomery, RN,BSN,CCM [place] Management Telephonic Care Management Coordinator Direct Phone: [phone] Toll Free: [phone] Fax: [phone]

## 2018-02-03 ENCOUNTER — Encounter (INDEPENDENT_AMBULATORY_CARE_PROVIDER_SITE_OTHER): Payer: PPO | Admitting: Ophthalmology

## 2018-02-17 ENCOUNTER — Encounter: Payer: Self-pay | Admitting: Endocrinology

## 2018-02-17 ENCOUNTER — Telehealth: Payer: Self-pay

## 2018-02-17 ENCOUNTER — Ambulatory Visit (INDEPENDENT_AMBULATORY_CARE_PROVIDER_SITE_OTHER): Payer: PPO | Admitting: Endocrinology

## 2018-02-17 VITALS — BP 138/90 | HR 90 | Ht 72.0 in | Wt 299.0 lb

## 2018-02-17 DIAGNOSIS — B351 Tinea unguium: Secondary | ICD-10-CM

## 2018-02-17 DIAGNOSIS — IMO0002 Reserved for concepts with insufficient information to code with codable children: Secondary | ICD-10-CM

## 2018-02-17 DIAGNOSIS — E1139 Type 2 diabetes mellitus with other diabetic ophthalmic complication: Secondary | ICD-10-CM

## 2018-02-17 DIAGNOSIS — E1165 Type 2 diabetes mellitus with hyperglycemia: Secondary | ICD-10-CM | POA: Diagnosis not present

## 2018-02-17 LAB — POCT GLYCOSYLATED HEMOGLOBIN (HGB A1C): Hemoglobin A1C: 8 % — AB (ref 4.0–5.6)

## 2018-02-17 MED ORDER — INSULIN GLARGINE 100 UNIT/ML SOLOSTAR PEN: 140.0000 [IU] | PEN_INJECTOR | SUBCUTANEOUS | 11 refills | Status: DC

## 2018-02-17 MED ORDER — INSULIN ASPART 100 UNIT/ML FLEXPEN: 30.0000 [IU] | PEN_INJECTOR | Freq: Two times a day (BID) | SUBCUTANEOUS | 11 refills | Status: DC

## 2018-02-17 NOTE — Patient Instructions (Addendum)
Your blood pressure is high today.  Please see your primary care provider soon, to have it rechecked check your blood sugar 4 times a day.  vary the time of day when you check, between before the 3 meals, and at bedtime.  also check if you have symptoms of your blood sugar being too high or too low.  please keep a record of the readings and bring it to your next appointment here.  You can write it on any piece of paper.  please call us sooner if your blood sugar goes below 70, or if you have a lot of readings over 200.   Please reduce the lantus to 140 units each morning, and: Increase the novolog to 30 units twice a day (just before each meal).   please come back for a follow-up appointment in 3 months.

## 2018-02-17 NOTE — Telephone Encounter (Signed)
Received a notice from Colog[facility] that sample received could not be processed due to stool exceeding the allowable weight (300 grams). Cologuard will notify patient and send a new kit 

## 2018-02-17 NOTE — Progress Notes (Signed)
Subjective:    Patient ID: Martin Agee Sr., male    DOB: [birth year], 72 y.o.   MRN: [id number]  HPI Pt returns for f/u of diabetes mellitus: DM type: Insulin-requiring type 2.  Dx'ed: 4196 Complications: polyneuropathy, foot ulcer, renal insuff, and retinopathy.  Therapy: insulin since 2013.  DKA: never.   Severe hypoglycemia: never.  Pancreatitis: never.   Other: he chose to change to a qd insulin regimen, after poor results with multiple daily injections; he declines weight loss surgery; he eats 2 meals per day.   Interval history: no cbg record, but states cbg's vary from 79-200's.  It is in general highest after breakfast, and lowest fasting.  He misses approx 1 dose per week.   Past Medical History:  Diagnosis Date  . Diabetes mellitus   . Hypertension     Past Surgical History:  Procedure Laterality Date  . HERNIA REPAIR     >10 years ago  . IR RADIOLOGIST EVAL & MGMT  12/24/2016  . right shoulder surgery     around 2014    Social History   Socioeconomic History  . Marital status: Married    Spouse name: Not on file  . Number of children: Not on file  . Years of education: Not on file  . Highest education level: Not on file  Occupational History  . Not on file  Social Needs  . Financial resource strain: Not on file  . Food insecurity:    Worry: Not on file    Inability: Not on file  . Transportation needs:    Medical: Not on file    Non-medical: Not on file  Tobacco Use  . Smoking status: Former Smoker    Last attempt to quit: 04/29/2000    Years since quitting: 17.8  . Smokeless tobacco: Never Used  . Tobacco comment: smoked for 10 years on and off  Substance and Sexual Activity  . Alcohol use: Yes    Alcohol/week: 0.0 standard drinks    Comment: 6 martinis a year  . Drug use: No  . Sexual activity: Yes  Lifestyle  . Physical activity:    Days per week: Not on file    Minutes per session: Not on file  . Stress: Not on file  Relationships  .  Social connections:    Talks on phone: Not on file    Gets together: Not on file    Attends religious service: Not on file    Active member of club or organization: Not on file    Attends meetings of clubs or organizations: Not on file    Relationship status: Not on file  . Intimate partner violence:    Fear of current or ex partner: Not on file    Emotionally abused: Not on file    Physically abused: Not on file    Forced sexual activity: Not on file  Other Topics Concern  . Not on file  Social History Narrative   Married. 3 kids. 7 grandkids. No greatgrandkids.       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 Azelastine HCl 0.15 % SOLN SPRAY 2 SPRAYS INTO EACH NOSTRIL EVERY DAY  2  . BESIVANCE 0.6 % SUSP Place 1 drop into the left eye 3 (three) times daily.  1  . brimonidine (  ALPHAGAN) 0.2 % ophthalmic solution     . ibuprofen (ADVIL,MOTRIN) 200 MG tablet Take 400 mg by mouth every 6 (six) hours as needed.    . Insulin Pen Needle (NOVOFINE) 32G X 6 MM MISC USE 5 TIMES DAILY 300 each 3  . Pitavastatin Calcium 1 MG TABS Take 1 tablet (1 mg total) by mouth daily. 30 tablet 11  . valsartan-hydrochlorothiazide (DIOVAN-HCT) 320-25 MG tablet TAKE 1 TABLET BY MOUTH DAILY. 30 tablet 3  . amitriptyline (ELAVIL) 10 MG tablet TAKE 1 TABLET BY MOUTH EVERYDAY AT BEDTIME (Patient not taking: Reported on 12/24/2017) 90 tablet 2  . ofloxacin (OCUFLOX) 0.3 % ophthalmic solution Place 1 drop into both eyes. Administer 1 drop in both eyes up to 4 times a day for 2 days after monthly injections  12  . ONETOUCH VERIO test strip CHECK LEVELS 4 TIMES DAILY 360 each 3   No current facility-administered medications on file prior to visit.     Allergies  Allergen Reactions  . Simvastatin Diarrhea    Family History  Problem Relation Age of  Onset  . Hypertension Mother   . Heart disease Mother        CHF did not see doctor  . Diabetes Maternal Grandmother   . Diabetes Son     BP 138/90   Pulse 90   Ht 6' (1.829 m)   Wt 299 lb (135.6 kg)   SpO2 98%   BMI 40.55 kg/m    Review of Systems He denies hypoglycemia.  He says toenail fungus is worse.      Objective:   Physical Exam VITAL SIGNS: See vs page.    GENERAL: no distress.   Pulses: dorsalis pedis intact bilat. MSK: no deformity of the feet.  CV: 2+ bilat leg edema.  Skin: healed ulcer at the left great toe. normal temp on the feet. Mild erythema of the legs and both toenails.   Neuro: sensation is intact to touch on the feet, but severely decreased from normal.  Ext: There is severe bilateral onychomycosis of the toenails.     A1c=8.0%    Assessment & Plan:  Insulin-requiring type 2 DM, with renal insuff: The pattern of his cbg's indicates he needs some adjustment in his therapy Onychomycosis. Worse. Erythema, in a pt with neuropathy.  He would not be able to tell about paronychial infection. HTN: is noted today  Patient Instructions  Your blood pressure is high today.  Please see your primary care provider soon, to have it rechecked check your blood sugar 4 times a day.  vary the time of day when you check, between before the 3 meals, and at bedtime.  also check if you have symptoms of your blood sugar being too high or too low.  please keep a record of the readings and bring it to your next appointment here.  You can write it on any piece of paper.  please call us sooner if your blood sugar goes below 70, or if you have a lot of readings over 200.   Please reduce the lantus to 140 units each morning, and: Increase the novolog to 30 units twice a day (just before each meal).   please come back for a follow-up appointment in 3 months.     ref podiatry

## 2018-02-18 ENCOUNTER — Encounter (INDEPENDENT_AMBULATORY_CARE_PROVIDER_SITE_OTHER): Payer: PPO | Admitting: Ophthalmology

## 2018-02-18 ENCOUNTER — Ambulatory Visit: Payer: PPO | Admitting: Endocrinology

## 2018-02-18 DIAGNOSIS — E113313 Type 2 diabetes mellitus with moderate nonproliferative diabetic retinopathy with macular edema, bilateral: Secondary | ICD-10-CM | POA: Diagnosis not present

## 2018-02-18 DIAGNOSIS — H35033 Hypertensive retinopathy, bilateral: Secondary | ICD-10-CM | POA: Diagnosis not present

## 2018-02-18 DIAGNOSIS — I10 Essential (primary) hypertension: Secondary | ICD-10-CM

## 2018-02-18 DIAGNOSIS — E11311 Type 2 diabetes mellitus with unspecified diabetic retinopathy with macular edema: Secondary | ICD-10-CM

## 2018-02-18 DIAGNOSIS — H43813 Vitreous degeneration, bilateral: Secondary | ICD-10-CM

## 2018-03-03 ENCOUNTER — Other Ambulatory Visit: Payer: Self-pay | Admitting: Family Medicine

## 2018-03-25 ENCOUNTER — Encounter (INDEPENDENT_AMBULATORY_CARE_PROVIDER_SITE_OTHER): Payer: PPO | Admitting: Ophthalmology

## 2018-04-07 DIAGNOSIS — H52223 Regular astigmatism, bilateral: Secondary | ICD-10-CM | POA: Diagnosis not present

## 2018-04-07 DIAGNOSIS — H524 Presbyopia: Secondary | ICD-10-CM | POA: Diagnosis not present

## 2018-04-07 DIAGNOSIS — H5213 Myopia, bilateral: Secondary | ICD-10-CM | POA: Diagnosis not present

## 2018-04-07 DIAGNOSIS — H35352 Cystoid macular degeneration, left eye: Secondary | ICD-10-CM | POA: Diagnosis not present

## 2018-04-07 DIAGNOSIS — E119 Type 2 diabetes mellitus without complications: Secondary | ICD-10-CM | POA: Diagnosis not present

## 2018-05-13 DIAGNOSIS — L905 Scar conditions and fibrosis of skin: Secondary | ICD-10-CM | POA: Diagnosis not present

## 2018-05-13 DIAGNOSIS — C4442 Squamous cell carcinoma of skin of scalp and neck: Secondary | ICD-10-CM | POA: Diagnosis not present

## 2018-05-20 ENCOUNTER — Encounter: Payer: Self-pay | Admitting: Endocrinology

## 2018-05-20 ENCOUNTER — Ambulatory Visit (INDEPENDENT_AMBULATORY_CARE_PROVIDER_SITE_OTHER): Payer: PPO | Admitting: Endocrinology

## 2018-05-20 VITALS — BP 130/78 | HR 96 | Ht 72.0 in | Wt 298.2 lb

## 2018-05-20 DIAGNOSIS — E1165 Type 2 diabetes mellitus with hyperglycemia: Secondary | ICD-10-CM

## 2018-05-20 DIAGNOSIS — E1139 Type 2 diabetes mellitus with other diabetic ophthalmic complication: Secondary | ICD-10-CM | POA: Diagnosis not present

## 2018-05-20 DIAGNOSIS — IMO0002 Reserved for concepts with insufficient information to code with codable children: Secondary | ICD-10-CM

## 2018-05-20 LAB — POCT GLYCOSYLATED HEMOGLOBIN (HGB A1C): HEMOGLOBIN A1C: 7.7 % — AB (ref 4.0–5.6)

## 2018-05-20 MED ORDER — INSULIN GLARGINE 100 UNIT/ML SOLOSTAR PEN: 150.0000 [IU] | PEN_INJECTOR | SUBCUTANEOUS | Status: DC

## 2018-05-20 MED ORDER — INSULIN ASPART 100 UNIT/ML FLEXPEN: 30.0000 [IU] | PEN_INJECTOR | Freq: Three times a day (TID) | SUBCUTANEOUS | 11 refills | Status: DC

## 2018-05-20 NOTE — Progress Notes (Signed)
Subjective:    Patient ID: Martin Agee Sr., male    DOB: 07-07-1945, 73 y.o.   MRN: [id number]  HPI Pt returns for f/u of diabetes mellitus:  DM type: Insulin-requiring type 2.  Dx'ed: 6644 Complications: polyneuropathy, foot ulcer, renal insuff, and DR.  Therapy: insulin since 2013.  DKA: never.   Severe hypoglycemia: never.  Pancreatitis: never.   Other: he takes multiple daily injections; he declines weight loss surgery; he eats 2-3 meals per day.   Interval history: He brings a record of his cbg's which I have reviewed today.  He checks fasting and after breakfast.  cbg varies from 75-180.  He seldom misses the insulin.   He takes lantus 150 units qam, and novolog 30 units 2-3 times a day (just before each meal).   Past Medical History:  Diagnosis Date  . Diabetes mellitus   . Hypertension     Past Surgical History:  Procedure Laterality Date  . HERNIA REPAIR     >10 years ago  . IR RADIOLOGIST EVAL & MGMT  12/24/2016  . right shoulder surgery     around 2014    Social History   Socioeconomic History  . Marital status: Married    Spouse name: Not on file  . Number of children: Not on file  . Years of education: Not on file  . Highest education level: Not on file  Occupational History  . Not on file  Social Needs  . Financial resource strain: Not on file  . Food insecurity:    Worry: Not on file    Inability: Not on file  . Transportation needs:    Medical: Not on file    Non-medical: Not on file  Tobacco Use  . Smoking status: Former Smoker    Last attempt to quit: 04/29/2000    Years since quitting: 18.0  . Smokeless tobacco: Never Used  . Tobacco comment: smoked for 10 years on and off  Substance and Sexual Activity  . Alcohol use: Yes    Alcohol/week: 0.0 standard drinks    Comment: 6 martinis a year  . Drug use: No  . Sexual activity: Yes  Lifestyle  . Physical activity:    Days per week: Not on file    Minutes per session: Not on file  .  Stress: Not on file  Relationships  . Social connections:    Talks on phone: Not on file    Gets together: Not on file    Attends religious service: Not on file    Active member of club or organization: Not on file    Attends meetings of clubs or organizations: Not on file    Relationship status: Not on file  . Intimate partner violence:    Fear of current or ex partner: Not on file    Emotionally abused: Not on file    Physically abused: Not on file    Forced sexual activity: Not on file  Other Topics Concern  . Not on file  Social History Narrative   Married. 3 kids. 7 grandkids. No greatgrandkids.       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 amitriptyline (ELAVIL) 10 MG tablet TAKE 1 TABLET BY MOUTH EVERYDAY AT BEDTIME 90 tablet 0  . Azelastine HCl 0.15 % SOLN SPRAY 2  SPRAYS INTO EACH NOSTRIL EVERY DAY  2  . BESIVANCE 0.6 % SUSP Place 1 drop into the left eye 3 (three) times daily.  1  . brimonidine (ALPHAGAN) 0.2 % ophthalmic solution     . ibuprofen (ADVIL,MOTRIN) 200 MG tablet Take 400 mg by mouth every 6 (six) hours as needed.    . Insulin Pen Needle (NOVOFINE) 32G X 6 MM MISC USE 5 TIMES DAILY 300 each 3  . ofloxacin (OCUFLOX) 0.3 % ophthalmic solution Place 1 drop into both eyes. Administer 1 drop in both eyes up to 4 times a day for 2 days after monthly injections  12  . ONETOUCH VERIO test strip CHECK LEVELS 4 TIMES DAILY 360 each 3  . Pitavastatin Calcium 1 MG TABS Take 1 tablet (1 mg total) by mouth daily. 30 tablet 11  . valsartan-hydrochlorothiazide (DIOVAN-HCT) 320-25 MG tablet TAKE 1 TABLET BY MOUTH DAILY. 90 tablet 1   No current facility-administered medications on file prior to visit.     Allergies  Allergen Reactions  . Simvastatin Diarrhea    Family History  Problem Relation Age of Onset    . Hypertension Mother   . Heart disease Mother        CHF did not see doctor  . Diabetes Maternal Grandmother   . Diabetes Son     BP 130/78 (BP Location: Right Arm, Patient Position: Sitting, Cuff Size: Large)   Pulse 96   Ht 6' (1.829 m)   Wt 298 lb 3.2 oz (135.3 kg)   SpO2 95%   BMI 40.44 kg/m    Review of Systems He denies hypoglycemia.     Objective:   Physical Exam VITAL SIGNS:  See vs page  GENERAL: no distress Pulses: dorsalis pedis intact bilat. MSK: no deformity of the feet.  CV: 2+ bilat leg edema.  Skin: healed ulcer at the left great toe. normal temp on the feet. Spotty hyperpigmentation of the legs  Neuro: sensation is intact to touch on the feet, but severely decreased from normal.  Ext: There is severe bilateral onychomycosis of the toenails.   A1c=7.7%    Assessment & Plan:  Insulin-requiring type 2 DM: he needs increased rx.    Patient Instructions  check your blood sugar 4 times a day.  vary the time of day when you check, between before the 3 meals, and at bedtime.  also check if you have symptoms of your blood sugar being too high or too low.  please keep a record of the readings and bring it to your next appointment here.  You can write it on any piece of paper.  please call us sooner if your blood sugar goes below 70, or if you have a lot of readings over 200.   Please continue the same insulins.  please come back for a follow-up appointment in 3 months.

## 2018-05-20 NOTE — Patient Instructions (Addendum)
check your blood sugar 4 times a day.  vary the time of day when you check, between before the 3 meals, and at bedtime.  also check if you have symptoms of your blood sugar being too high or too low.  please keep a record of the readings and bring it to your next appointment here.  You can write it on any piece of paper.  please call us sooner if your blood sugar goes below 70, or if you have a lot of readings over 200.   Please continue the same insulins.  please come back for a follow-up appointment in 3 months.

## 2018-06-01 ENCOUNTER — Other Ambulatory Visit: Payer: Self-pay

## 2018-06-01 ENCOUNTER — Telehealth: Payer: Self-pay | Admitting: Endocrinology

## 2018-06-01 DIAGNOSIS — E1165 Type 2 diabetes mellitus with hyperglycemia: Principal | ICD-10-CM

## 2018-06-01 DIAGNOSIS — E1139 Type 2 diabetes mellitus with other diabetic ophthalmic complication: Secondary | ICD-10-CM

## 2018-06-01 DIAGNOSIS — IMO0002 Reserved for concepts with insufficient information to code with codable children: Secondary | ICD-10-CM

## 2018-06-01 MED ORDER — INSULIN GLARGINE 100 UNIT/ML SOLOSTAR PEN: 150.0000 [IU] | PEN_INJECTOR | SUBCUTANEOUS | 3 refills | Status: DC

## 2018-06-01 NOTE — Telephone Encounter (Signed)
Insulin Glargine (LANTUS SOLOSTAR) 100 UNIT/ML Solostar Pen 60 mL 3 06/01/2018    Sig - Route: Inject 150 Units into the skin every morning. - Subcutaneous   Sent to pharmacy as: Insulin Glargine (LANTUS SOLOSTAR) 100 UNIT/ML Solostar Pen   E-Prescribing Status: Receipt confirmed by pharmacy (06/01/2018 11:53 AM EST)    Spoke with pharmacist. Confirmed Rx will be available for pick up tomorrow. Filling today will be refilled too soon and therefore denied by insurance. Called pt and made him aware. Verbalized acceptance and understanding.

## 2018-06-01 NOTE — Telephone Encounter (Signed)
Insulin Glargine (LANTUS SOLOSTAR) 100 UNIT/ML Solostar Pen  Patient stated that he is having a hard time getting his refills.  He just seen Dr Loanne Drilling last week. He would like a call back from our office to see why it is stating he only has one left.  Please advise

## 2018-06-03 ENCOUNTER — Encounter (INDEPENDENT_AMBULATORY_CARE_PROVIDER_SITE_OTHER): Payer: PPO | Admitting: Ophthalmology

## 2018-06-03 DIAGNOSIS — H43813 Vitreous degeneration, bilateral: Secondary | ICD-10-CM | POA: Diagnosis not present

## 2018-06-03 DIAGNOSIS — E113313 Type 2 diabetes mellitus with moderate nonproliferative diabetic retinopathy with macular edema, bilateral: Secondary | ICD-10-CM | POA: Diagnosis not present

## 2018-06-03 DIAGNOSIS — I10 Essential (primary) hypertension: Secondary | ICD-10-CM | POA: Diagnosis not present

## 2018-06-03 DIAGNOSIS — H35033 Hypertensive retinopathy, bilateral: Secondary | ICD-10-CM

## 2018-06-03 DIAGNOSIS — E11311 Type 2 diabetes mellitus with unspecified diabetic retinopathy with macular edema: Secondary | ICD-10-CM

## 2018-06-03 LAB — HM DIABETES EYE EXAM

## 2018-06-04 ENCOUNTER — Encounter: Payer: Self-pay | Admitting: Family Medicine

## 2018-06-10 DIAGNOSIS — H43813 Vitreous degeneration, bilateral: Secondary | ICD-10-CM | POA: Diagnosis not present

## 2018-06-10 DIAGNOSIS — D3130 Benign neoplasm of unspecified choroid: Secondary | ICD-10-CM | POA: Diagnosis not present

## 2018-06-10 DIAGNOSIS — H35372 Puckering of macula, left eye: Secondary | ICD-10-CM | POA: Diagnosis not present

## 2018-06-10 DIAGNOSIS — E113213 Type 2 diabetes mellitus with mild nonproliferative diabetic retinopathy with macular edema, bilateral: Secondary | ICD-10-CM | POA: Diagnosis not present

## 2018-07-01 ENCOUNTER — Encounter (INDEPENDENT_AMBULATORY_CARE_PROVIDER_SITE_OTHER): Payer: PPO | Admitting: Ophthalmology

## 2018-07-01 ENCOUNTER — Encounter: Payer: Self-pay | Admitting: Family Medicine

## 2018-07-01 ENCOUNTER — Ambulatory Visit (INDEPENDENT_AMBULATORY_CARE_PROVIDER_SITE_OTHER): Payer: PPO | Admitting: Family Medicine

## 2018-07-01 VITALS — BP 130/82 | HR 87 | Temp 98.0°F | Ht 72.0 in | Wt 293.2 lb

## 2018-07-01 DIAGNOSIS — I723 Aneurysm of iliac artery: Secondary | ICD-10-CM | POA: Diagnosis not present

## 2018-07-01 DIAGNOSIS — E785 Hyperlipidemia, unspecified: Secondary | ICD-10-CM

## 2018-07-01 DIAGNOSIS — Z1211 Encounter for screening for malignant neoplasm of colon: Secondary | ICD-10-CM | POA: Diagnosis not present

## 2018-07-01 DIAGNOSIS — Z6839 Body mass index (BMI) 39.0-39.9, adult: Secondary | ICD-10-CM | POA: Diagnosis not present

## 2018-07-01 DIAGNOSIS — E1142 Type 2 diabetes mellitus with diabetic polyneuropathy: Secondary | ICD-10-CM

## 2018-07-01 DIAGNOSIS — E1159 Type 2 diabetes mellitus with other circulatory complications: Secondary | ICD-10-CM

## 2018-07-01 DIAGNOSIS — I10 Essential (primary) hypertension: Secondary | ICD-10-CM

## 2018-07-01 DIAGNOSIS — I152 Hypertension secondary to endocrine disorders: Secondary | ICD-10-CM

## 2018-07-01 LAB — BASIC METABOLIC PANEL
BUN: 21 mg/dL (ref 6–23)
CHLORIDE: 97 meq/L (ref 96–112)
CO2: 30 meq/L (ref 19–32)
Calcium: 9.4 mg/dL (ref 8.4–10.5)
Creatinine, Ser: 1.37 mg/dL (ref 0.40–1.50)
GFR: 50.81 mL/min — ABNORMAL LOW (ref >60.00)
Glucose, Bld: 320 mg/dL — ABNORMAL HIGH (ref 70–99)
POTASSIUM: 4.6 meq/L (ref 3.5–5.1)
Sodium: 135 mEq/L (ref 135–145)

## 2018-07-01 LAB — LDL CHOLESTEROL, DIRECT: Direct LDL: 173 mg/dL

## 2018-07-01 MED ORDER — PITAVASTATIN CALCIUM 1 MG PO TABS: 1.0000 mg | ORAL_TABLET | ORAL | 3 refills | Status: DC

## 2018-07-01 MED ORDER — VALSARTAN-HYDROCHLOROTHIAZIDE 320-25 MG PO TABS: 1.0000 | ORAL_TABLET | Freq: Every day | ORAL | 3 refills | Status: DC

## 2018-07-01 NOTE — Progress Notes (Addendum)
Phone [phone]   Subjective:  Martin LOCKER Sr. is a 73 y.o. year old very pleasant male patient who presents for/with See problem oriented charting ROS- some fullness in sinuses and left ear again. No chest pain or shortness of breath. Does get intermittent cough/sinus drainage  - he is trying nasocort   Past Medical History-  Patient Active Problem List   Diagnosis Date Noted  . Undiagnosed cardiac murmurs 08/15/2015    Priority: High  . Morbid obesity ([place]) 04/12/2014    Priority: High  . DM (diabetes mellitus) type II uncontrolled with eye manifestation ([place]) 02/03/2007    Priority: High  . PAD (peripheral artery disease) ([place]) 08/20/2017    Priority: Medium  . Partial tear of right Achilles tendon 02/12/2017    Priority: Medium  . Aortic atherosclerosis ([place]) 10/07/2016    Priority: Medium  . History of skin cancer 08/15/2015    Priority: Medium  . Diabetic retinopathy ([place]) 02/21/2015    Priority: Medium  . Hyperlipidemia 08/24/2014    Priority: Medium  . Diabetic polyneuropathy ([place]) 04/11/2009    Priority: Medium  . Hypertension associated with diabetes ([place]) 02/03/2007    Priority: Medium  . Onychomycosis of toenail 02/17/2018    Priority: Low  . Allergic rhinitis 08/20/2017    Priority: Low  . Former smoker 08/24/2014    Priority: Low  . Ingrowing toenail 07/19/2014    Priority: Low  . BACK PAIN, LUMBAR, WITH RADICULOPATHY 08/30/2008    Priority: Low  . C. difficile colitis 09/25/2016  . Aspiration pneumonia ([place]) 09/25/2016    Medications- reviewed and updated Current Outpatient Medications  Medication Sig Dispense Refill  . Azelastine HCl 0.15 % SOLN SPRAY 2 SPRAYS INTO EACH NOSTRIL EVERY DAY  2  . BESIVANCE 0.6 % SUSP Place 1 drop into the left eye 3 (three) times daily.  1  . brimonidine (ALPHAGAN) 0.2 % ophthalmic solution     . dorzolamide (TRUSOPT) 2 % ophthalmic solution     . ibuprofen (ADVIL,MOTRIN) 200 MG tablet Take 400 mg by mouth  every 6 (six) hours as needed.    . insulin aspart (NOVOLOG FLEXPEN) 100 UNIT/ML FlexPen Inject 30 Units into the skin 3 (three) times daily with meals. And pen needles 3/day 30 pen 11  . Insulin Glargine (LANTUS SOLOSTAR) 100 UNIT/ML Solostar Pen Inject 150 Units into the skin every morning. 60 mL 3  . Insulin Pen Needle (NOVOFINE) 32G X 6 MM MISC USE 5 TIMES DAILY 300 each 3  . ofloxacin (OCUFLOX) 0.3 % ophthalmic solution Place 1 drop into both eyes. Administer 1 drop in both eyes up to 4 times a day for 2 days after monthly injections  12  . ONETOUCH VERIO test strip CHECK LEVELS 4 TIMES DAILY 360 each 3  . [START ON 07/02/2018] Pitavastatin Calcium 1 MG TABS Take 1 tablet (1 mg total) by mouth 2 (two) times a week. 26 tablet 3  . valsartan-hydrochlorothiazide (DIOVAN-HCT) 320-25 MG tablet Take 1 tablet by mouth daily. 90 tablet 3   No current facility-administered medications for this visit.      Objective:  BP 130/82 (BP Location: Right Arm, Patient Position: Sitting, Cuff Size: Large)   Pulse 87   Temp 98 F (36.7 C) (Oral)   Ht 6' (1.829 m)   Wt 293 lb 3.2 oz (133 kg)   SpO2 97%   BMI 39.77 kg/m  Gen: NAD, resting comfortably Some post nasal drip noted, some edema of turbinates CV: RRR  no murmurs rubs or gallops Lungs: CTAB no crackles, wheeze, rhonchi Abdomen: soft/nontender/nondistended/normal bowel sounds. obese Ext: no edema Skin: warm, dry     Assessment and Plan   #Hypertension S: compliant with valsartan hctz 320-25 mg . Intermittent issues getting this.  A/P: Stable. Continue current medications.    #Hyperlipidemia S:compliant with Livalo 1 mg about twice a week. Has some myalgias A/P: will update direct LDL   #Diabetic neuropathy S: Did not do well with amitriptyline 10 mg-had some confusion. Using very sparing advil - perhaps once every 3 weeks. Moving from toes back onto is feet A/P: stable largely- continue to monitor without regular medication  #  Retinopathy S:now working with Dr. Milford Cage for retinopathy ([facility] in [place])- he was concerned about the implant in his eye with steroids as he has thought symptoms worsened  A/P: stable- continue retinopathy follow up with optho even if out of state  Other notes: 1. BMI monitoring- elevated BMI noted: Body mass index is 39.77 kg/m. Encouraged need for healthy eating, regular exercise, weight loss.   Morbid obesity with BMI over 35 with HTN, diabetes. Congratulated on almost 10 lbs weight loss!  BMI Metric Follow Up - 07/01/18 1045      BMI Metric Follow Up-Please document annually   BMI Metric Follow Up  Education provided      2. 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 There is evidence of abnormal dilation of the left common iliac artery, measuring 1.6 cm AP x 1.6 cm TRV and is stable compared to the prior exam. The largest aortic measurement  is 2.5 cm." - likely stable- update scan September or later 3. nasocort helpful for allergies- has seen ENT. 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 Pitavastatin Calcium 1 MG TABS    Sig: Take 1 tablet (1 mg total) by mouth 2 (two) times a week.    Dispense:  26 tablet    Refill:  3  . valsartan-hydrochlorothiazide (DIOVAN-HCT) 320-25 MG tablet      Sig: Take 1 tablet by mouth daily.    Dispense:  90 tablet    Refill:  3    Return precautions advised.  [name], MD

## 2018-07-01 NOTE — Patient Instructions (Addendum)
Health Maintenance Due  Topic Date Due  . COLONOSCOPY- would love for you to follow through with colonoscopy since you didn't tolerate cologuard. We will call you within two weeks about your referral. If you do not hear within 3 weeks, give [place] a call.  09/21/2017   Please stop by lab before you go If you do not have mychart- we will call you about results within 5 business days of [place] receiving them.  If you have mychart- we will send your results within 3 business days of [place] receiving them.  If abnormal or we want to clarify a result, we will call or mychart you to make sure you receive the message.  If you have questions or concerns or don't hear within 5-7 days, please send [place] a message or call us.

## 2018-07-08 DIAGNOSIS — H35372 Puckering of macula, left eye: Secondary | ICD-10-CM | POA: Diagnosis not present

## 2018-07-08 DIAGNOSIS — H43813 Vitreous degeneration, bilateral: Secondary | ICD-10-CM | POA: Diagnosis not present

## 2018-07-08 DIAGNOSIS — E113213 Type 2 diabetes mellitus with mild nonproliferative diabetic retinopathy with macular edema, bilateral: Secondary | ICD-10-CM | POA: Diagnosis not present

## 2018-07-08 DIAGNOSIS — D3130 Benign neoplasm of unspecified choroid: Secondary | ICD-10-CM | POA: Diagnosis not present

## 2018-07-31 ENCOUNTER — Encounter: Payer: Self-pay | Admitting: Family Medicine

## 2018-08-17 ENCOUNTER — Encounter: Payer: Self-pay | Admitting: Endocrinology

## 2018-08-19 ENCOUNTER — Ambulatory Visit: Payer: PPO | Admitting: Endocrinology

## 2018-09-08 ENCOUNTER — Encounter: Payer: Self-pay | Admitting: Endocrinology

## 2018-09-09 ENCOUNTER — Ambulatory Visit (INDEPENDENT_AMBULATORY_CARE_PROVIDER_SITE_OTHER): Payer: PPO | Admitting: Endocrinology

## 2018-09-09 DIAGNOSIS — E1165 Type 2 diabetes mellitus with hyperglycemia: Secondary | ICD-10-CM

## 2018-09-09 DIAGNOSIS — E1139 Type 2 diabetes mellitus with other diabetic ophthalmic complication: Secondary | ICD-10-CM

## 2018-09-09 DIAGNOSIS — IMO0002 Reserved for concepts with insufficient information to code with codable children: Secondary | ICD-10-CM

## 2018-09-09 NOTE — Patient Instructions (Signed)
check your blood sugar 4 times a day.  vary the time of day when you check, between before the 3 meals, and at bedtime.  also check if you have symptoms of your blood sugar being too high or too low.  please keep a record of the readings and bring it to your next appointment here.  You can write it on any piece of paper.  please call us sooner if your blood sugar goes below 70, or if you have a lot of readings over 200.   Please continue the same insulins.  please come back for a follow-up appointment in 2 months.

## 2018-09-09 NOTE — Progress Notes (Signed)
Subjective:    Patient ID: Martin Agee Sr., male    DOB: 28-Feb-1946, 73 y.o.   MRN: [id number]  HPI  telehealth visit today via doxy video visit.  Alternatives to telehealth are presented to this patient, and the patient agrees to the telehealth visit. Pt is advised of the cost of the visit, and agrees to this, also.   Patient is at home, and I am at the office.   Persons attending the telehealth visit: the patient and I Pt returns for f/u of diabetes mellitus:  DM type: Insulin-requiring type 2.  Dx'ed: 1017 Complications: polyneuropathy, foot ulcer, renal insuff, CAD, and DR.  Therapy: insulin since 2013.  DKA: never.   Severe hypoglycemia: never.  Pancreatitis: never.   Other: he takes multiple daily injections; he declines weight loss surgery; he eats 2-3 meals per day.   Interval history: pt says cbg varies from 75-269.  It is in general higher as the day goes on.  He seldom misses the insulin.   Past Medical History:  Diagnosis Date  . Diabetes mellitus   . Hypertension     Past Surgical History:  Procedure Laterality Date  . HERNIA REPAIR     >10 years ago  . IR RADIOLOGIST EVAL & MGMT  12/24/2016  . right shoulder surgery     around 2014    Social History   Socioeconomic History  . Marital status: Married    Spouse name: Not on file  . Number of children: Not on file  . Years of education: Not on file  . Highest education level: Not on file  Occupational History  . Not on file  Social Needs  . Financial resource strain: Not on file  . Food insecurity:    Worry: Not on file    Inability: Not on file  . Transportation needs:    Medical: Not on file    Non-medical: Not on file  Tobacco Use  . Smoking status: Former Smoker    Last attempt to quit: 04/29/2000    Years since quitting: 18.3  . Smokeless tobacco: Never Used  . Tobacco comment: smoked for 10 years on and off  Substance and Sexual Activity  . Alcohol use: Yes    Alcohol/week: 0.0 standard  drinks    Comment: 6 martinis a year  . Drug use: No  . Sexual activity: Yes  Lifestyle  . Physical activity:    Days per week: Not on file    Minutes per session: Not on file  . Stress: Not on file  Relationships  . Social connections:    Talks on phone: Not on file    Gets together: Not on file    Attends religious service: Not on file    Active member of club or organization: Not on file    Attends meetings of clubs or organizations: Not on file    Relationship status: Not on file  . Intimate partner violence:    Fear of current or ex partner: Not on file    Emotionally abused: Not on file    Physically abused: Not on file    Forced sexual activity: Not on file  Other Topics Concern  . Not on file  Social History Narrative   Married. 3 kids. 7 grandkids. No greatgrandkids.       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 Azelastine HCl 0.15 % SOLN SPRAY 2 SPRAYS INTO EACH NOSTRIL EVERY DAY  2  . BESIVANCE 0.6 % SUSP Place 1 drop into the left eye 3 (three) times daily.  1  . brimonidine (ALPHAGAN) 0.2 % ophthalmic solution     . dorzolamide (TRUSOPT) 2 % ophthalmic solution     . ibuprofen (ADVIL,MOTRIN) 200 MG tablet Take 400 mg by mouth every 6 (six) hours as needed.    . insulin aspart (NOVOLOG FLEXPEN) 100 UNIT/ML FlexPen Inject 30 Units into the skin 3 (three) times daily with meals. And pen needles 3/day 30 pen 11  . Insulin Glargine (LANTUS SOLOSTAR) 100 UNIT/ML Solostar Pen Inject 150 Units into the skin every morning. 60 mL 3  . Insulin Pen Needle (NOVOFINE) 32G X 6 MM MISC USE 5 TIMES DAILY 300 each 3  . ofloxacin (OCUFLOX) 0.3 % ophthalmic solution Place 1 drop into both eyes. Administer 1 drop in both eyes up to 4 times a day for 2 days after monthly injections  12  . ONETOUCH VERIO test strip CHECK  LEVELS 4 TIMES DAILY 360 each 3  . Pitavastatin Calcium 1 MG TABS Take 1 tablet (1 mg total) by mouth 2 (two) times a week. 26 tablet 3  . valsartan-hydrochlorothiazide (DIOVAN-HCT) 320-25 MG tablet Take 1 tablet by mouth daily. 90 tablet 3   No current facility-administered medications on file prior to visit.     Allergies  Allergen Reactions  . Simvastatin Diarrhea    Family History  Problem Relation Age of Onset  . Hypertension Mother   . Heart disease Mother        CHF did not see doctor  . Diabetes Maternal Grandmother   . Diabetes Son      Review of Systems He denies hypoglycemia.      Objective:   Physical Exam       Assessment & Plan:  Insulin-requiring type 2 DM, with renal insuff: uncertain glycemic control. we discussed.  he declines a1c now.  CAD: in this setting, he should avoid hypoglycemia.  Patient Instructions  check your blood sugar 4 times a day.  vary the time of day when you check, between before the 3 meals, and at bedtime.  also check if you have symptoms of your blood sugar being too high or too low.  please keep a record of the readings and bring it to your next appointment here.  You can write it on any piece of paper.  please call us sooner if your blood sugar goes below 70, or if you have a lot of readings over 200.   Please continue the same insulins.  please come back for a follow-up appointment in 2 months.

## 2018-09-29 ENCOUNTER — Other Ambulatory Visit: Payer: Self-pay

## 2018-09-29 ENCOUNTER — Ambulatory Visit (HOSPITAL_COMMUNITY): Admit: 2018-09-29 | Payer: PPO | Admitting: Cardiovascular Disease

## 2018-09-29 ENCOUNTER — Encounter (HOSPITAL_COMMUNITY)
Admission: EM | Disposition: A | Discharge status: Skilled Nursing Facility | Payer: Self-pay | Source: Home / Self Care | Attending: Thoracic Surgery (Cardiothoracic Vascular Surgery)

## 2018-09-29 ENCOUNTER — Emergency Department (HOSPITAL_COMMUNITY): Payer: PPO

## 2018-09-29 ENCOUNTER — Inpatient Hospital Stay (HOSPITAL_COMMUNITY): Payer: PPO

## 2018-09-29 ENCOUNTER — Inpatient Hospital Stay (HOSPITAL_COMMUNITY)
Admission: EM | Admit: 2018-09-29 | Discharge: 2018-10-19 | DRG: 234 | Disposition: A | Discharge status: Skilled Nursing Facility | Payer: PPO | Attending: Thoracic Surgery (Cardiothoracic Vascular Surgery) | Admitting: Thoracic Surgery (Cardiothoracic Vascular Surgery)

## 2018-09-29 DIAGNOSIS — D62 Acute posthemorrhagic anemia: Secondary | ICD-10-CM | POA: Diagnosis not present

## 2018-09-29 DIAGNOSIS — E8881 Metabolic syndrome: Secondary | ICD-10-CM | POA: Diagnosis present

## 2018-09-29 DIAGNOSIS — E785 Hyperlipidemia, unspecified: Secondary | ICD-10-CM | POA: Diagnosis not present

## 2018-09-29 DIAGNOSIS — E8779 Other fluid overload: Secondary | ICD-10-CM | POA: Diagnosis not present

## 2018-09-29 DIAGNOSIS — Z01818 Encounter for other preprocedural examination: Secondary | ICD-10-CM

## 2018-09-29 DIAGNOSIS — I739 Peripheral vascular disease, unspecified: Secondary | ICD-10-CM | POA: Diagnosis not present

## 2018-09-29 DIAGNOSIS — J90 Pleural effusion, not elsewhere classified: Secondary | ICD-10-CM | POA: Diagnosis not present

## 2018-09-29 DIAGNOSIS — I25110 Atherosclerotic heart disease of native coronary artery with unstable angina pectoris: Secondary | ICD-10-CM | POA: Diagnosis present

## 2018-09-29 DIAGNOSIS — I252 Old myocardial infarction: Secondary | ICD-10-CM

## 2018-09-29 DIAGNOSIS — F418 Other specified anxiety disorders: Secondary | ICD-10-CM | POA: Diagnosis present

## 2018-09-29 DIAGNOSIS — Z85828 Personal history of other malignant neoplasm of skin: Secondary | ICD-10-CM

## 2018-09-29 DIAGNOSIS — I4891 Unspecified atrial fibrillation: Secondary | ICD-10-CM | POA: Diagnosis not present

## 2018-09-29 DIAGNOSIS — E871 Hypo-osmolality and hyponatremia: Secondary | ICD-10-CM | POA: Diagnosis not present

## 2018-09-29 DIAGNOSIS — Z48812 Encounter for surgical aftercare following surgery on the circulatory system: Secondary | ICD-10-CM | POA: Diagnosis not present

## 2018-09-29 DIAGNOSIS — E1142 Type 2 diabetes mellitus with diabetic polyneuropathy: Secondary | ICD-10-CM | POA: Diagnosis present

## 2018-09-29 DIAGNOSIS — Z1159 Encounter for screening for other viral diseases: Secondary | ICD-10-CM

## 2018-09-29 DIAGNOSIS — D72829 Elevated white blood cell count, unspecified: Secondary | ICD-10-CM | POA: Diagnosis not present

## 2018-09-29 DIAGNOSIS — I509 Heart failure, unspecified: Secondary | ICD-10-CM | POA: Diagnosis not present

## 2018-09-29 DIAGNOSIS — E1159 Type 2 diabetes mellitus with other circulatory complications: Secondary | ICD-10-CM | POA: Diagnosis not present

## 2018-09-29 DIAGNOSIS — Z951 Presence of aortocoronary bypass graft: Secondary | ICD-10-CM | POA: Diagnosis not present

## 2018-09-29 DIAGNOSIS — Z7401 Bed confinement status: Secondary | ICD-10-CM | POA: Diagnosis not present

## 2018-09-29 DIAGNOSIS — Z833 Family history of diabetes mellitus: Secondary | ICD-10-CM

## 2018-09-29 DIAGNOSIS — I480 Paroxysmal atrial fibrillation: Secondary | ICD-10-CM | POA: Diagnosis not present

## 2018-09-29 DIAGNOSIS — K5900 Constipation, unspecified: Secondary | ICD-10-CM | POA: Diagnosis not present

## 2018-09-29 DIAGNOSIS — E1169 Type 2 diabetes mellitus with other specified complication: Secondary | ICD-10-CM | POA: Diagnosis not present

## 2018-09-29 DIAGNOSIS — I152 Hypertension secondary to endocrine disorders: Secondary | ICD-10-CM | POA: Diagnosis not present

## 2018-09-29 DIAGNOSIS — Z8249 Family history of ischemic heart disease and other diseases of the circulatory system: Secondary | ICD-10-CM

## 2018-09-29 DIAGNOSIS — R0602 Shortness of breath: Secondary | ICD-10-CM | POA: Diagnosis not present

## 2018-09-29 DIAGNOSIS — Z6841 Body Mass Index (BMI) 40.0 and over, adult: Secondary | ICD-10-CM

## 2018-09-29 DIAGNOSIS — E1139 Type 2 diabetes mellitus with other diabetic ophthalmic complication: Secondary | ICD-10-CM

## 2018-09-29 DIAGNOSIS — R41841 Cognitive communication deficit: Secondary | ICD-10-CM | POA: Diagnosis not present

## 2018-09-29 DIAGNOSIS — Z79899 Other long term (current) drug therapy: Secondary | ICD-10-CM

## 2018-09-29 DIAGNOSIS — E11319 Type 2 diabetes mellitus with unspecified diabetic retinopathy without macular edema: Secondary | ICD-10-CM | POA: Diagnosis not present

## 2018-09-29 DIAGNOSIS — Z9889 Other specified postprocedural states: Secondary | ICD-10-CM | POA: Diagnosis not present

## 2018-09-29 DIAGNOSIS — E1165 Type 2 diabetes mellitus with hyperglycemia: Secondary | ICD-10-CM | POA: Diagnosis not present

## 2018-09-29 DIAGNOSIS — E11649 Type 2 diabetes mellitus with hypoglycemia without coma: Secondary | ICD-10-CM | POA: Diagnosis not present

## 2018-09-29 DIAGNOSIS — I10 Essential (primary) hypertension: Secondary | ICD-10-CM | POA: Diagnosis present

## 2018-09-29 DIAGNOSIS — N179 Acute kidney failure, unspecified: Secondary | ICD-10-CM | POA: Diagnosis not present

## 2018-09-29 DIAGNOSIS — J9811 Atelectasis: Secondary | ICD-10-CM

## 2018-09-29 DIAGNOSIS — R2681 Unsteadiness on feet: Secondary | ICD-10-CM | POA: Diagnosis not present

## 2018-09-29 DIAGNOSIS — M6281 Muscle weakness (generalized): Secondary | ICD-10-CM | POA: Diagnosis not present

## 2018-09-29 DIAGNOSIS — I340 Nonrheumatic mitral (valve) insufficiency: Secondary | ICD-10-CM | POA: Diagnosis not present

## 2018-09-29 DIAGNOSIS — I951 Orthostatic hypotension: Secondary | ICD-10-CM | POA: Diagnosis not present

## 2018-09-29 DIAGNOSIS — R42 Dizziness and giddiness: Secondary | ICD-10-CM | POA: Diagnosis not present

## 2018-09-29 DIAGNOSIS — E1122 Type 2 diabetes mellitus with diabetic chronic kidney disease: Secondary | ICD-10-CM | POA: Diagnosis not present

## 2018-09-29 DIAGNOSIS — Z888 Allergy status to other drugs, medicaments and biological substances status: Secondary | ICD-10-CM

## 2018-09-29 DIAGNOSIS — E1140 Type 2 diabetes mellitus with diabetic neuropathy, unspecified: Secondary | ICD-10-CM | POA: Diagnosis not present

## 2018-09-29 DIAGNOSIS — Z4682 Encounter for fitting and adjustment of non-vascular catheter: Secondary | ICD-10-CM | POA: Diagnosis not present

## 2018-09-29 DIAGNOSIS — I2111 ST elevation (STEMI) myocardial infarction involving right coronary artery: Principal | ICD-10-CM | POA: Diagnosis present

## 2018-09-29 DIAGNOSIS — I213 ST elevation (STEMI) myocardial infarction of unspecified site: Secondary | ICD-10-CM | POA: Diagnosis not present

## 2018-09-29 DIAGNOSIS — N183 Chronic kidney disease, stage 3 (moderate): Secondary | ICD-10-CM | POA: Diagnosis not present

## 2018-09-29 DIAGNOSIS — R918 Other nonspecific abnormal finding of lung field: Secondary | ICD-10-CM | POA: Diagnosis not present

## 2018-09-29 DIAGNOSIS — Z794 Long term (current) use of insulin: Secondary | ICD-10-CM

## 2018-09-29 DIAGNOSIS — R079 Chest pain, unspecified: Secondary | ICD-10-CM | POA: Diagnosis not present

## 2018-09-29 DIAGNOSIS — I2510 Atherosclerotic heart disease of native coronary artery without angina pectoris: Secondary | ICD-10-CM

## 2018-09-29 DIAGNOSIS — Z87891 Personal history of nicotine dependence: Secondary | ICD-10-CM

## 2018-09-29 DIAGNOSIS — Z20828 Contact with and (suspected) exposure to other viral communicable diseases: Secondary | ICD-10-CM | POA: Diagnosis not present

## 2018-09-29 DIAGNOSIS — M2550 Pain in unspecified joint: Secondary | ICD-10-CM | POA: Diagnosis not present

## 2018-09-29 DIAGNOSIS — J811 Chronic pulmonary edema: Secondary | ICD-10-CM | POA: Diagnosis not present

## 2018-09-29 DIAGNOSIS — Z09 Encounter for follow-up examination after completed treatment for conditions other than malignant neoplasm: Secondary | ICD-10-CM

## 2018-09-29 DIAGNOSIS — I499 Cardiac arrhythmia, unspecified: Secondary | ICD-10-CM | POA: Diagnosis not present

## 2018-09-29 DIAGNOSIS — I700 Atherosclerosis of aorta: Secondary | ICD-10-CM | POA: Diagnosis not present

## 2018-09-29 DIAGNOSIS — Z01810 Encounter for preprocedural cardiovascular examination: Secondary | ICD-10-CM | POA: Diagnosis not present

## 2018-09-29 DIAGNOSIS — F329 Major depressive disorder, single episode, unspecified: Secondary | ICD-10-CM | POA: Diagnosis not present

## 2018-09-29 DIAGNOSIS — R531 Weakness: Secondary | ICD-10-CM | POA: Diagnosis not present

## 2018-09-29 HISTORY — PX: LEFT HEART CATH AND CORONARY ANGIOGRAPHY: CATH118249

## 2018-09-29 LAB — COMPREHENSIVE METABOLIC PANEL
ALT: 22 U/L (ref 0–44)
AST: 32 U/L (ref 15–41)
Albumin: 3.9 g/dL (ref 3.5–5.0)
Alkaline Phosphatase: 51 U/L (ref 38–126)
Anion gap: 10 (ref 5–15)
BUN: 18 mg/dL (ref 8–23)
CO2: 22 mmol/L (ref 22–32)
Calcium: 9 mg/dL (ref 8.9–10.3)
Chloride: 104 mmol/L (ref 98–111)
Creatinine, Ser: 1.18 mg/dL (ref 0.61–1.24)
GFR calc Af Amer: >60 mL/min (ref >60)
GFR calc non Af Amer: >60 mL/min (ref >60)
Glucose, Bld: 256 mg/dL — ABNORMAL HIGH (ref 70–99)
Potassium: 5 mmol/L (ref 3.5–5.1)
Sodium: 136 mmol/L (ref 135–145)
Total Bilirubin: 1.6 mg/dL — ABNORMAL HIGH (ref 0.3–1.2)
Total Protein: 7.3 g/dL (ref 6.5–8.1)

## 2018-09-29 LAB — I-STAT TROPONIN, ED: Troponin i, poc: 0 ng/mL (ref 0.00–0.08)

## 2018-09-29 LAB — LIPID PANEL
Cholesterol: 205 mg/dL — ABNORMAL HIGH (ref 0–200)
HDL: 27 mg/dL — ABNORMAL LOW (ref >40)
LDL Cholesterol: 139 mg/dL — ABNORMAL HIGH (ref 0–99)
Total CHOL/HDL Ratio: 7.6 RATIO
Triglycerides: 195 mg/dL — ABNORMAL HIGH (ref <150)
VLDL: 39 mg/dL (ref 0–40)

## 2018-09-29 LAB — CBC WITH DIFFERENTIAL/PLATELET
Abs Immature Granulocytes: 0.04 10*3/uL (ref 0.00–0.07)
Basophils Absolute: 0 10*3/uL (ref 0.0–0.1)
Basophils Relative: 0 %
Eosinophils Absolute: 0.2 10*3/uL (ref 0.0–0.5)
Eosinophils Relative: 4 %
HCT: 38 % — ABNORMAL LOW (ref 39.0–52.0)
Hemoglobin: 12.4 g/dL — ABNORMAL LOW (ref 13.0–17.0)
Immature Granulocytes: 1 %
Lymphocytes Relative: 42 %
Lymphs Abs: 2.5 10*3/uL (ref 0.7–4.0)
MCH: 30.2 pg (ref 26.0–34.0)
MCHC: 32.6 g/dL (ref 30.0–36.0)
MCV: 92.5 fL (ref 80.0–100.0)
Monocytes Absolute: 0.4 10*3/uL (ref 0.1–1.0)
Monocytes Relative: 6 %
Neutro Abs: 2.9 10*3/uL (ref 1.7–7.7)
Neutrophils Relative %: 47 %
Platelets: 33 10*3/uL — ABNORMAL LOW (ref 150–400)
RBC: 4.11 MIL/uL — ABNORMAL LOW (ref 4.22–5.81)
RDW: 14.2 % (ref 11.5–15.5)
WBC: 6 10*3/uL (ref 4.0–10.5)
nRBC: 0 % (ref 0.0–0.2)

## 2018-09-29 LAB — HEMOGLOBIN A1C
Hgb A1c MFr Bld: 8.1 % — ABNORMAL HIGH (ref 4.8–5.6)
Mean Plasma Glucose: 185.77 mg/dL

## 2018-09-29 LAB — APTT: aPTT: 184 seconds (ref 24–36)

## 2018-09-29 LAB — CBC
HCT: 35.6 % — ABNORMAL LOW (ref 39.0–52.0)
HCT: 31.1 % — ABNORMAL LOW (ref 39.0–52.0)
Hemoglobin: 12 g/dL — ABNORMAL LOW (ref 13.0–17.0)
Hemoglobin: 10.1 g/dL — ABNORMAL LOW (ref 13.0–17.0)
MCH: 29.6 pg (ref 26.0–34.0)
MCH: 29.4 pg (ref 26.0–34.0)
MCHC: 33.7 g/dL (ref 30.0–36.0)
MCHC: 32.5 g/dL (ref 30.0–36.0)
MCV: 87.9 fL (ref 80.0–100.0)
MCV: 90.7 fL (ref 80.0–100.0)
Platelets: 286 10*3/uL (ref 150–400)
Platelets: 237 10*3/uL (ref 150–400)
RBC: 4.05 MIL/uL — ABNORMAL LOW (ref 4.22–5.81)
RBC: 3.43 MIL/uL — ABNORMAL LOW (ref 4.22–5.81)
RDW: 14 % (ref 11.5–15.5)
RDW: 14.1 % (ref 11.5–15.5)
WBC: 7.3 10*3/uL (ref 4.0–10.5)
WBC: 5.9 10*3/uL (ref 4.0–10.5)
nRBC: 0 % (ref 0.0–0.2)
nRBC: 0 % (ref 0.0–0.2)

## 2018-09-29 LAB — PROTIME-INR
INR: 1.2 (ref 0.8–1.2)
Prothrombin Time: 14.7 seconds (ref 11.4–15.2)

## 2018-09-29 LAB — ECHOCARDIOGRAM COMPLETE
Height: 72 in
Weight: 4720 oz

## 2018-09-29 LAB — MRSA PCR SCREENING: MRSA by PCR: NEGATIVE

## 2018-09-29 LAB — I-STAT CREATININE, ED: Creatinine, Ser: 1.1 mg/dL (ref 0.61–1.24)

## 2018-09-29 LAB — SARS CORONAVIRUS 2 BY RT PCR (HOSPITAL ORDER, PERFORMED IN ~~LOC~~ HOSPITAL LAB): SARS Coronavirus 2: NEGATIVE

## 2018-09-29 LAB — GLUCOSE, CAPILLARY
Glucose-Capillary: 156 mg/dL — ABNORMAL HIGH (ref 70–99)
Glucose-Capillary: 161 mg/dL — ABNORMAL HIGH (ref 70–99)

## 2018-09-29 LAB — CREATININE, SERUM
Creatinine, Ser: 0.89 mg/dL (ref 0.61–1.24)
GFR calc Af Amer: >60 mL/min (ref >60)
GFR calc non Af Amer: >60 mL/min (ref >60)

## 2018-09-29 LAB — POCT ACTIVATED CLOTTING TIME: Activated Clotting Time: 208 seconds

## 2018-09-29 SURGERY — LEFT HEART CATH AND CORONARY ANGIOGRAPHY
Anesthesia: LOCAL

## 2018-09-29 MED ORDER — PERFLUTREN LIPID MICROSPHERE
INTRAVENOUS | Status: AC
  Administered 2018-09-29: 3 mL via INTRAVENOUS
  Filled 2018-09-29: qty 10

## 2018-09-29 MED ORDER — NITROGLYCERIN 0.4 MG SL SUBL: 0.4000 mg | SUBLINGUAL_TABLET | SUBLINGUAL | Status: DC | PRN

## 2018-09-29 MED ORDER — FENTANYL CITRATE (PF) 100 MCG/2ML IJ SOLN
INTRAMUSCULAR | Status: DC | PRN
  Administered 2018-09-29: 25 ug via INTRAVENOUS

## 2018-09-29 MED ORDER — LIDOCAINE HCL (PF) 1 % IJ SOLN
INTRAMUSCULAR | Status: AC
  Filled 2018-09-29: qty 30

## 2018-09-29 MED ORDER — INSULIN ASPART 100 UNIT/ML ~~LOC~~ SOLN
0.0000 [IU] | Freq: Three times a day (TID) | SUBCUTANEOUS | Status: DC
  Administered 2018-09-29 (×2): 3 [IU] via SUBCUTANEOUS
  Administered 2018-09-30: 8 [IU] via SUBCUTANEOUS
  Administered 2018-09-30: 3 [IU] via SUBCUTANEOUS
  Administered 2018-09-30: 8 [IU] via SUBCUTANEOUS

## 2018-09-29 MED ORDER — ASPIRIN 81 MG PO CHEW: 243.0000 mg | CHEWABLE_TABLET | ORAL | Status: DC

## 2018-09-29 MED ORDER — ONDANSETRON HCL 4 MG/2ML IJ SOLN: 4.0000 mg | Freq: Four times a day (QID) | INTRAMUSCULAR | Status: DC | PRN

## 2018-09-29 MED ORDER — METOPROLOL TARTRATE 5 MG/5ML IV SOLN
INTRAVENOUS | Status: DC | PRN
  Administered 2018-09-29 (×2): 2.5 mg via INTRAVENOUS

## 2018-09-29 MED ORDER — LABETALOL HCL 5 MG/ML IV SOLN: 10.0000 mg | INTRAVENOUS | Status: AC | PRN

## 2018-09-29 MED ORDER — SODIUM CHLORIDE 0.9 % IV SOLN
INTRAVENOUS | Status: DC
  Administered 2018-09-29: 12:00:00 via INTRAVENOUS

## 2018-09-29 MED ORDER — VERAPAMIL HCL 2.5 MG/ML IV SOLN
INTRAVENOUS | Status: AC
  Filled 2018-09-29: qty 2

## 2018-09-29 MED ORDER — ACETAMINOPHEN 325 MG PO TABS: 650.0000 mg | ORAL_TABLET | ORAL | Status: DC | PRN

## 2018-09-29 MED ORDER — SODIUM CHLORIDE 0.9 % IV SOLN: 250.0000 mL | INTRAVENOUS | Status: DC | PRN

## 2018-09-29 MED ORDER — TIROFIBAN HCL IN NACL 5-0.9 MG/100ML-% IV SOLN
INTRAVENOUS | Status: AC
  Filled 2018-09-29: qty 100

## 2018-09-29 MED ORDER — VALSARTAN-HYDROCHLOROTHIAZIDE 320-25 MG PO TABS: 1.0000 | ORAL_TABLET | Freq: Every day | ORAL | Status: DC

## 2018-09-29 MED ORDER — DIAZEPAM 5 MG PO TABS
5.0000 mg | ORAL_TABLET | Freq: Four times a day (QID) | ORAL | Status: DC | PRN
  Filled 2018-09-29: qty 1

## 2018-09-29 MED ORDER — ASPIRIN 81 MG PO CHEW
81.0000 mg | CHEWABLE_TABLET | Freq: Every day | ORAL | Status: DC
  Administered 2018-09-29: 81 mg via ORAL

## 2018-09-29 MED ORDER — METOPROLOL TARTRATE 5 MG/5ML IV SOLN
INTRAVENOUS | Status: AC
  Filled 2018-09-29: qty 5

## 2018-09-29 MED ORDER — BIVALIRUDIN TRIFLUOROACETATE 250 MG IV SOLR
INTRAVENOUS | Status: AC
  Filled 2018-09-29: qty 250

## 2018-09-29 MED ORDER — SODIUM CHLORIDE 0.9% FLUSH
3.0000 mL | Freq: Two times a day (BID) | INTRAVENOUS | Status: DC
  Administered 2018-09-30 (×2): 3 mL via INTRAVENOUS

## 2018-09-29 MED ORDER — SODIUM CHLORIDE 0.9 % IV SOLN
INTRAVENOUS | Status: DC
  Administered 2018-09-29: 15:00:00 via INTRAVENOUS

## 2018-09-29 MED ORDER — VERAPAMIL HCL 2.5 MG/ML IV SOLN
INTRAVENOUS | Status: DC | PRN
  Administered 2018-09-29: 10 mL via INTRA_ARTERIAL

## 2018-09-29 MED ORDER — NITROGLYCERIN 1 MG/10 ML FOR IR/CATH LAB
INTRA_ARTERIAL | Status: AC
  Filled 2018-09-29: qty 10

## 2018-09-29 MED ORDER — ASPIRIN EC 81 MG PO TBEC
81.0000 mg | DELAYED_RELEASE_TABLET | Freq: Every day | ORAL | Status: DC
  Administered 2018-09-30: 81 mg via ORAL
  Filled 2018-09-29: qty 1

## 2018-09-29 MED ORDER — HEPARIN SODIUM (PORCINE) 5000 UNIT/ML IJ SOLN: 5000.0000 [IU] | Freq: Three times a day (TID) | INTRAMUSCULAR | Status: DC

## 2018-09-29 MED ORDER — HYDRALAZINE HCL 20 MG/ML IJ SOLN: 10.0000 mg | INTRAMUSCULAR | Status: AC | PRN

## 2018-09-29 MED ORDER — TIROFIBAN HCL IN NACL 5-0.9 MG/100ML-% IV SOLN
INTRAVENOUS | Status: AC | PRN
  Administered 2018-09-29: 0.15 ug/kg/min via INTRAVENOUS

## 2018-09-29 MED ORDER — TIROFIBAN (AGGRASTAT) BOLUS VIA INFUSION
INTRAVENOUS | Status: DC | PRN
  Administered 2018-09-29: 3345 ug via INTRAVENOUS

## 2018-09-29 MED ORDER — IRBESARTAN 300 MG PO TABS
300.0000 mg | ORAL_TABLET | Freq: Every day | ORAL | Status: DC
  Administered 2018-09-30: 300 mg via ORAL
  Filled 2018-09-29 (×2): qty 1

## 2018-09-29 MED ORDER — SODIUM CHLORIDE 0.9% FLUSH: 3.0000 mL | INTRAVENOUS | Status: DC | PRN

## 2018-09-29 MED ORDER — ASPIRIN 300 MG RE SUPP: 300.0000 mg | RECTAL | Status: DC

## 2018-09-29 MED ORDER — NITROGLYCERIN IN D5W 200-5 MCG/ML-% IV SOLN
0.0000 ug/min | INTRAVENOUS | Status: DC
  Filled 2018-09-29: qty 250

## 2018-09-29 MED ORDER — HEPARIN (PORCINE) IN NACL 1000-0.9 UT/500ML-% IV SOLN
INTRAVENOUS | Status: DC | PRN
  Administered 2018-09-29 (×2): 500 mL

## 2018-09-29 MED ORDER — ATORVASTATIN CALCIUM 80 MG PO TABS
80.0000 mg | ORAL_TABLET | Freq: Every day | ORAL | Status: DC
  Filled 2018-09-29 (×2): qty 1

## 2018-09-29 MED ORDER — TIROFIBAN HCL IN NACL 5-0.9 MG/100ML-% IV SOLN: 0.1500 ug/kg/min | INTRAVENOUS | Status: DC

## 2018-09-29 MED ORDER — HEPARIN SODIUM (PORCINE) 1000 UNIT/ML IJ SOLN
INTRAMUSCULAR | Status: DC | PRN
  Administered 2018-09-29: 2500 [IU] via INTRAVENOUS
  Administered 2018-09-29: 1000 [IU] via INTRAVENOUS

## 2018-09-29 MED ORDER — FENTANYL CITRATE (PF) 100 MCG/2ML IJ SOLN
INTRAMUSCULAR | Status: AC
  Filled 2018-09-29: qty 2

## 2018-09-29 MED ORDER — IOHEXOL 350 MG/ML SOLN
INTRAVENOUS | Status: DC | PRN
  Administered 2018-09-29: 75 mL via INTRAVENOUS

## 2018-09-29 MED ORDER — HEPARIN (PORCINE) IN NACL 1000-0.9 UT/500ML-% IV SOLN
INTRAVENOUS | Status: AC
  Filled 2018-09-29: qty 1000

## 2018-09-29 MED ORDER — HEPARIN SODIUM (PORCINE) 5000 UNIT/ML IJ SOLN
INTRAMUSCULAR | Status: AC
  Filled 2018-09-29: qty 1

## 2018-09-29 MED ORDER — HEPARIN SODIUM (PORCINE) 5000 UNIT/ML IJ SOLN
4000.0000 [IU] | Freq: Once | INTRAMUSCULAR | Status: AC
  Administered 2018-09-29: 4000 [IU] via INTRAVENOUS

## 2018-09-29 MED ORDER — PERFLUTREN LIPID MICROSPHERE
1.0000 mL | INTRAVENOUS | Status: AC | PRN
  Administered 2018-09-29: 16:00:00 3 mL via INTRAVENOUS
  Filled 2018-09-29: qty 10

## 2018-09-29 MED ORDER — MIDAZOLAM HCL 2 MG/2ML IJ SOLN
INTRAMUSCULAR | Status: DC | PRN
  Administered 2018-09-29: 2 mg via INTRAVENOUS

## 2018-09-29 MED ORDER — TIROFIBAN HCL IN NACL 5-0.9 MG/100ML-% IV SOLN
0.1500 ug/kg/min | INTRAVENOUS | Status: DC
  Administered 2018-09-29 – 2018-10-01 (×8): 0.15 ug/kg/min via INTRAVENOUS
  Filled 2018-09-29 (×8): qty 100

## 2018-09-29 MED ORDER — LIDOCAINE HCL (PF) 1 % IJ SOLN
INTRAMUSCULAR | Status: DC | PRN
  Administered 2018-09-29: 2 mL

## 2018-09-29 MED ORDER — HYDROCHLOROTHIAZIDE 25 MG PO TABS
25.0000 mg | ORAL_TABLET | Freq: Every day | ORAL | Status: DC
  Administered 2018-09-30: 25 mg via ORAL
  Filled 2018-09-29: qty 1

## 2018-09-29 MED ORDER — METOPROLOL TARTRATE 25 MG PO TABS
25.0000 mg | ORAL_TABLET | Freq: Two times a day (BID) | ORAL | Status: DC
  Administered 2018-09-29 – 2018-09-30 (×4): 25 mg via ORAL
  Filled 2018-09-29 (×4): qty 1

## 2018-09-29 MED ORDER — MIDAZOLAM HCL 2 MG/2ML IJ SOLN
INTRAMUSCULAR | Status: AC
  Filled 2018-09-29: qty 2

## 2018-09-29 MED ORDER — HEPARIN (PORCINE) 25000 UT/250ML-% IV SOLN
1800.0000 [IU]/h | INTRAVENOUS | Status: DC
  Administered 2018-09-29: 1100 [IU]/h via INTRAVENOUS
  Administered 2018-09-30: 1650 [IU]/h via INTRAVENOUS
  Filled 2018-09-29 (×2): qty 250

## 2018-09-29 MED ORDER — SODIUM CHLORIDE 0.9 % IV SOLN
INTRAVENOUS | Status: AC | PRN
  Administered 2018-09-29: 50 mL/h via INTRAVENOUS

## 2018-09-29 MED ORDER — HEPARIN SODIUM (PORCINE) 1000 UNIT/ML IJ SOLN
INTRAMUSCULAR | Status: AC
  Filled 2018-09-29: qty 1

## 2018-09-29 SURGICAL SUPPLY — 14 items
CATH INFINITI 5FR ANG PIGTAIL (CATHETERS) ×1 IMPLANT
CATH OPTITORQUE TIG 4.0 5F (CATHETERS) ×1 IMPLANT
COVER DOME SNAP 22 D (MISCELLANEOUS) ×1 IMPLANT
DEVICE RAD COMP TR BAND LRG (VASCULAR PRODUCTS) ×1 IMPLANT
GLIDESHEATH SLEND SS 6F .021 (SHEATH) ×1 IMPLANT
GUIDEWIRE INQWIRE 1.5J.035X260 (WIRE) IMPLANT
HOVERMATT SINGLE USE (MISCELLANEOUS) ×1 IMPLANT
INQWIRE 1.5J .035X260CM (WIRE) ×2
KIT ENCORE 26 ADVANTAGE (KITS) ×1 IMPLANT
KIT HEART LEFT (KITS) ×2 IMPLANT
PACK CARDIAC CATHETERIZATION (CUSTOM PROCEDURE TRAY) ×2 IMPLANT
SHEATH PROBE COVER 6X72 (BAG) ×1 IMPLANT
TRANSDUCER W/STOPCOCK (MISCELLANEOUS) ×2 IMPLANT
TUBING CIL FLEX 10 FLL-RA (TUBING) ×2 IMPLANT

## 2018-09-29 NOTE — Progress Notes (Addendum)
ANTICOAGULATION CONSULT NOTE - Initial Consult  Pharmacy Consult for Heparin and Aggrestat  Indication: chest pain/ACS  Allergies  Allergen Reactions  . Simvastatin Diarrhea    Patient Measurements: Height: 6' (182.9 cm) Weight: 295 lb (133.8 kg) IBW/kg (Calculated) : 77.6 Heparin Dosing Weight: 108 kg  Vital Signs: Temp: 98.4 F (36.9 C) (06/02 1155) Temp Source: Oral (06/02 1155) BP: 154/88 (06/02 1313) Pulse Rate: 87 (06/02 1313)  Labs: Recent Labs    09/29/18 1140 09/29/18 1155 09/29/18 1200  HGB 12.4*  --   --   HCT 38.0*  --   --   PLT 33*  --   --   APTT  --  184*  --   LABPROT  --  14.7  --   INR  --  1.2  --   CREATININE 1.18  --  1.10    Estimated Creatinine Clearance: 83.4 mL/min (by C-G formula based on SCr of 1.1 mg/dL).   Medical History: Past Medical History:  Diagnosis Date  . Diabetes mellitus   . Hypertension     Medications:  Scheduled:  . aspirin  81 mg Oral Daily  . atorvastatin  80 mg Oral q1800  . metoprolol tartrate  25 mg Oral BID  . sodium chloride flush  3 mL Intravenous Q12H    Assessment: Martin [name] is a 73 year old male who presented to the hospital today for chest pain + suspected STEMI. He is not on any anticoagulation prior to admission. Patient has PMH significant for DM and CAD. Possible surgical intervention later this week. Pharmacy consulted for heparin and tirofiban infusion. Per consult request heparin to begin 8 hours post sheath removal.  Will begin soft dose regimen with no bolus. Patient received tirofiban bolus and infusion start in the cathlab, pharmacy to continue tirofiban per consult.   Goal of Therapy:  Heparin level 0.2-0.5 units/ml Monitor platelets by anticoagulation protocol: Yes   Plan:  Start heparin infusion at 1100 units/hr at 21:30 tonight. Heparin level / CBC with AM labs. Continue tirofiban 0.15 mcg/kg/minute per Dr. Claiborne Billings Follow-up with Dr. Claiborne Billings on stop time for tirofiban drip.      Thank you for the interesting consult and for involving pharmacy in this patient's care.  Tamela Gammon, PharmD 09/29/2018 3:07 PM PGY-1 Pharmacy Resident Direct Phone: [phone] Please check [email or web address] for unit-specific pharmacist phone numbers

## 2018-09-29 NOTE — Progress Notes (Signed)
  Echocardiogram 2D Echocardiogram has been performed.  Martin [name] 09/29/2018, 4:43 PM

## 2018-09-29 NOTE — H&P (Addendum)
History & Physical    Patient ID: Martin Ebron [facility]. 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   Past Medical History   Past Medical History:  Diagnosis Date   Diabetes mellitus    Hypertension     Past Surgical History:  Procedure Laterality Date   HERNIA REPAIR     >10 years ago   IR RADIOLOGIST EVAL & MGMT  12/24/2016   right shoulder surgery     around 2014    Allergies  Allergies  Allergen Reactions   Simvastatin Diarrhea   History of Present Illness    Martin [name] is a 73yo M with a hx as stated above who presented to [facility] on 09/29/2018 with STEMI. He states that he was on his way back from [place], [place] earlier today with his wide and they stopped to get some breakfast. While sitting in the car eating, he reports sudden onset of chest tightness with radiation to his left arm. They finished eating and proceeded to [place]. He did not tell his wife of his symptoms at that time. He was hoping that the pain would go away after a few minutes, which it did not. He was debating whether to come straight to the hospital or to go home first. He states that because his wife was with him, he did not want to put her at risk for COVID by coming to the hospital with her so he went home and called EMS for transport to the ED. He states that until this point he was in his usual state of health without SOB, LE swelling, orthopnea, PND, dizziness or syncope. He states that for approximately 10-15 years he would have occasional chest tightness to a lesser degree with increased stressful situations that would dissipate on their own. He follows closely with his PCP who has sent him for a stress test in the past, most  recently 3 years ago. He thinks that this was normal as there was no real follow up regarding the results. He has a hx of DM and states that his HbA1c runs in the 6-8 range. 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 He has no family hx of CAD. He is currently chest pain free and resting comfortably.   Home Medications    Prior to Admission medications   Medication Sig Start Date End Date Taking? Authorizing Provider  ibuprofen (ADVIL,MOTRIN) 200 MG tablet Take 400 mg by mouth every 6 (six) hours as needed.   Yes [provider]  valsartan-hydrochlorothiazide (DIOVAN-HCT) 320-25 MG tablet Take 1 tablet by mouth daily. 07/01/18  Yes Marin Olp, MD  BESIVANCE 0.6 % SUSP Place 1 drop into the left eye 3 (three) times daily. 08/27/16   [provider]  brimonidine (ALPHAGAN) 0.2 % ophthalmic solution  11/18/17   [provider]  dorzolamide (TRUSOPT) 2 % ophthalmic solution  06/03/18   [provider]  insulin aspart (NOVOLOG FLEXPEN) 100 UNIT/ML FlexPen Inject 30 Units into the skin 3 (three) times daily with meals. And pen needles 3/day 05/20/18  Renato Shin, MD  Insulin Glargine (LANTUS SOLOSTAR) 100 UNIT/ML Solostar Pen Inject 150 Units into the skin every morning. 06/01/18   Renato Shin, MD  Insulin Pen Needle (NOVOFINE) 32G X 6 MM MISC USE 5 TIMES DAILY 01/01/18   Renato Shin, MD  ofloxacin (OCUFLOX) 0.3 % ophthalmic solution Place 1 drop into both eyes. Administer 1 drop in both eyes up to 4 times a day for 2 days after monthly injections 06/23/17   [provider]  ONETOUCH VERIO test strip CHECK LEVELS 4 TIMES DAILY 01/25/18   Renato Shin, MD  Pitavastatin Calcium 1 MG TABS Take 1 tablet (1 mg total) by mouth 2 (two) times a week. 07/02/18   Marin Olp, MD    Family History    Family History  Problem Relation Age of  Onset   Hypertension Mother    Heart disease Mother        CHF did not see doctor   Diabetes Maternal Grandmother    Diabetes Son     Social History    Social History   Socioeconomic History   Marital status: Married    Spouse name: Not on file   Number of children: Not on file   Years of education: Not on file   Highest education level: Not on file  Occupational History   Not on file  Social Needs   Financial resource strain: Not on file   Food insecurity:    Worry: Not on file    Inability: Not on file   Transportation needs:    Medical: Not on file    Non-medical: Not on file  Tobacco Use   Smoking status: Former Smoker    Last attempt to quit: 04/29/2000    Years since quitting: 18.4   Smokeless tobacco: Never Used   Tobacco comment: smoked for 10 years on and off  Substance and Sexual Activity   Alcohol use: Yes    Alcohol/week: 0.0 standard drinks    Comment: 6 martinis a year   Drug use: No   Sexual activity: Yes  Lifestyle   Physical activity:    Days per week: Not on file    Minutes per session: Not on file   Stress: Not on file  Relationships   Social connections:    Talks on phone: Not on file    Gets together: Not on file    Attends religious service: Not on file    Active member of club or organization: Not on file    Attends meetings of clubs or organizations: Not on file    Relationship status: Not on file   Intimate partner violence:    Fear of current or ex partner: Not on file    Emotionally abused: Not on file    Physically abused: Not on file    Forced sexual activity: Not on file  Other Topics Concern   Not on file  Social History Narrative   Married. 3 kids. 7 grandkids. No greatgrandkids.       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  Physical Exam    Blood pressure (!) 142/76, pulse 95, temperature 98.4 F (36.9 C), temperature source Oral, resp. rate 19, height 6' (1.829 m), weight 133.8 kg, SpO2 98 %.   General:  Well developed, well nourished, NAD Skin: Warm, dry, intact. Right wrist cath site unremarkable  Head: Normocephalic, atraumatic, clear, moist mucus membranes. Neck: Negative for carotid bruits. No JVD Lungs: Clear to ausculation bilaterally. No wheezes, rales, or rhonchi. Breathing is unlabored. Cardiovascular: RRR with S1 S2. No murmurs, rubs, gallops, or LV heave appreciated. Abdomen: Soft, non-tender, non-distended. No obvious abdominal masses. MSK: Strength and tone appear normal for age. 5/5 in all extremities Extremities: No edema. No clubbing or cyanosis. DP/PT pulses 2+ bilaterally Neuro: Alert and oriented. No focal deficits. No facial asymmetry. MAE spontaneously. Psych: Responds to questions appropriately with normal affect.    Labs    Troponin [facility] of Care Test) Recent Labs    09/29/18 1158  TROPIPOC 0.00   No results for input(s): CKTOTAL, CKMB, TROPONINI in the last 72 hours. Lab Results  Component Value Date   WBC 6.0 09/29/2018   HGB 12.4 (L) 09/29/2018   HCT 38.0 (L) 09/29/2018   MCV 92.5 09/29/2018   PLT 33 (L) 09/29/2018    Recent Labs  Lab 09/29/18 1140 09/29/18 1200  NA 136  --   K 5.0  --   CL 104  --   CO2 22  --   BUN 18  --   CREATININE 1.18 1.10  CALCIUM 9.0  --   PROT 7.3  --   BILITOT 1.6*  --   ALKPHOS 51  --   ALT 22  --   AST 32  --   GLUCOSE 256*  --    Lab Results  Component Value Date   CHOL 205 (H) 09/29/2018   HDL 27 (L) 09/29/2018   LDLCALC 139 (H) 09/29/2018   TRIG 195 (H) 09/29/2018   No results found for: [facility]   Radiology Studies    No results found.  ECG & Cardiac Imaging    09/29/2018 NSR with significant ST elevation in inferior and lateral leads, HR 109  Echocardiogram 05/29/2016: Study Conclusions  - Left  ventricle: Small hyperdynamic LV with no obvious LVOT   gradient The cavity size was normal. Wall thickness was increased   in a pattern of mild LVH. Systolic function was vigorous. The   estimated ejection fraction was in the range of 65% to 70%. Wall   motion was normal; there were no regional wall motion   abnormalities. Doppler parameters are consistent with abnormal   left ventricular relaxation (grade 1 diastolic dysfunction). - Mitral valve: Calcified annulus. Mildly thickened leaflets . - Left atrium: The atrium was mildly dilated. - Atrial septum: No defect or patent foramen ovale was identified.  Assessment & Plan    1. STEMI: -Pt presented with to [facility] on 09/29/2018 with acute STEMI. He states that he was on his way back from [place], [place] earlier today with his wife and they stopped to get breakfast. While sitting in the car eating, he reports sudden onset of chest tightness with radiation to his left arm that last until he arrived home in [place] and called EMS for transport to the ED. He was given ASA 324 and 1 SL NTG with relief. EKG showed significant ST elevation in inferior and lateral leads.  -He was taken emergently to the cath lab that showed three vessel CAD for which he is awaiting TCTS consult for surgical intervention  -Currently resting comfortably without anginal symptoms -Telemetry with NSR and no ST chnages -Echocardiogram for pre-CABG, pending  -Continue ASA 81, high intensity atorvastatin, Lopressor 25mg  BID -LDL elevated at 139  with goal of 70mg /dl -HbA1c, 7.7 on 05/20/2018  2. HTN: -Elevated,  -Will continue lopressor -On home valsartan-HCTZ 320-25>>restart   3. HLD: -Elevated, LDL at 139 with goal of 70mg /dl -Continue high intensity atorvastatin -Will need repeat panel in 6-8 weeks with LFTs  4. DM2: -Moderately controlled, HbA1c, 7.7 -Will start SSI for glucose control while inpatient status   Severity of Illness: The appropriate patient status  for this patient is INPATIENT. Inpatient status is judged to be reasonable and necessary in order to provide the required intensity of service to ensure the patient's safety. The patient's presenting symptoms, physical exam findings, and initial radiographic and laboratory data in the context of their chronic comorbidities is felt to place them at high risk for further clinical deterioration. Furthermore, it is not anticipated that the patient will be medically stable for discharge from the hospital within 2 midnights of admission. The following factors support the patient status of inpatient.   " The patient's presenting symptoms include chest pain  " The worrisome physical exam findings include STEMI on EKG. " The initial radiographic and laboratory data are worrisome because of STEMI on EKG. " The chronic co-morbidities include DM2, HTN.   * I certify that at the point of admission it is my clinical judgment that the patient will require inpatient hospital care spanning beyond 2 midnights from the point of admission due to high intensity of service, high risk for further deterioration and high frequency of surveillance required.*     Signed, Kathyrn Drown NP-C [facility] Pager: [phone] 09/29/2018, @NOW   Patient seen and examined. Agree with assessment and plan.  Martin [name] is a 73 year old gentleman who is followed by Dr. Yong Channel and Dr. Renato Shin.  He has a history of type 2 diabetes mellitus, hypertension, presented today in the setting of an inferior STEMI.  The patient developed chest pain approximately 945 this morning while driving.  His chest pain persisted.  He ultimately drove home, EMS was contacted and ECG revealed sinus rhythm at 94 bpm with 1 mm of ST elevation in leads II 3 and F, and V4 V5.  There was mild ST abnormality with T wave inversion in lead aVF.  Upon arrival to the catheterization laboratory the patient was chest pain-free and and he had received heparin 4000  units in the emergency room.  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  The RCA is a large dominant vessel and there was evidence for distal embolization to the apical portion of the PDA vessel.  He was also found to have significant concomitant CAD with a 95% near ostial LAD stenosis: As high-grade circumflex disease.  He also had moderate mid LAD stenoses and diagonal stenosis.  Since the patient was pain-free with TIMI-3 flow and the LAD lesion may not be favorable for future intervention it was felt that the patient would most likely benefit from surgical consultation for consideration of CABG revascularization surgery following continued stabilization.  As result the patient was started on Aggrastat bolus plus infusion in the laboratory with plans to continue the infusion for at least 24 hours or depending upon the time of CABG surgery.  Dr. Nils Pyle saw the patient in consultation the Cath Lab and concurred with the plan that CABG surgery would provide the best treatment strategy.  Of note, the patient also admits to a history of transient cardiac arrhythmia that has been ongoing  intermittently over many years.  In the lab he developed an episode of probable AF with RVR when the pigtail catheter was passed into the left ventricle.  He ultimately reverted back to sinus rhythm following metoprolol 2.5 mg IV x2.  Plan to admit the patient to the ICU.  Will initiate low-dose IV V nitroglycerin, continue Aggrastat and resume heparin 8 hours post radial sheath removal and initiate beta-blocker therapy.  High potency statin therapy is necessary.  Plan for CABG surgery later this week.   Troy Sine, MD, [facility] 09/29/2018 4:53 PM

## 2018-09-29 NOTE — ED Triage Notes (Signed)
Substernal cp this morning at 0945 with L arm numbness. 324 ASA 1 SLN given PTA.

## 2018-09-29 NOTE — Consult Note (Addendum)
[address]       [place] [address]             [place] #[id number] Date of Birth: 05-Jan-1946  Referring: Claiborne Billings Primary Care: Marin Olp, MD Primary Cardiologist:No primary care provider on file.  Chief Complaint:  STEMI, Chest pain, CAD  History of Present Illness:      Mr. Diana is a 73 yo obese white male with known history of HTN, Hyperlipidemia, Diabetes complicated by retinopathy and neuropathy.  He was in his usual state of health until this morning.  He states him and his wife were driving home from their farm near [place] when he developed chest pain.  He states they stopped briefly to get a breakfast sandwich when he noticed his arm went numb.  He continued to drive and upon arrival to his house he just overall felt poorly, was lightheaded and told his wife to call EMS.  Upon arrival code STEMI was initiated and he was taken to the catheterization lab for emergent cath.  He was found to have CAD and coronary bypass grafting was requested.  Currently, the patient states he is chest pain free.  He denies N/V.  He states he did get short of breath while walking towards his house.  Upon further questioning he does admit that he has noticed recently that he does not have much energy.  He states he used to get up and get to work, but currently he gets up and has coffee and has to rest before he can get on with his daily activities.  Overall he states he isn't very active due to his Neuropathy.  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  He smoked briefly in the past for 4-5 years and he states it was probably a pack per day.  He denies family history of CAD, but states that he believes he has had Atrial Fibrillation in the past, but they have never been able to isolate or track the occurrences.   Current Activity/ Functional Status: Patient is independent  with mobility/ambulation, transfers, ADL's, IADL's.   Zubrod Score: At the time of surgery this patients most appropriate activity status/level should be described as: []     0    Normal activity, no symptoms [x]     1    Restricted in physical strenuous activity but ambulatory, able to do out light work []     2    Ambulatory and capable of self care, unable to do work activities, up and about                 more than 50%  Of the time                            []     3    Only limited self care, in bed greater than 50% of waking hours []     4    Completely disabled, no self care, confined to bed or chair []     5    Moribund  Past Medical History:  Diagnosis Date   Diabetes mellitus    Hypertension     Past Surgical History:  Procedure Laterality Date   HERNIA REPAIR     >10 years ago  IR RADIOLOGIST EVAL & MGMT  12/24/2016   right shoulder surgery     around 2014    Social History   Tobacco Use  Smoking Status Former Smoker   Last attempt to quit: 04/29/2000   Years since quitting: 18.4  Smokeless Tobacco Never Used  Tobacco Comment   smoked for 10 years on and off    Social History   Substance and Sexual Activity  Alcohol Use Yes   Alcohol/week: 0.0 standard drinks   Comment: 6 martinis a year     Allergies  Allergen Reactions   Simvastatin Diarrhea    Current Facility-Administered Medications  Medication Dose Route Frequency Provider Last Rate Last Dose   0.9 %  sodium chloride infusion   Intravenous Continuous Davonna Belling, MD 10 mL/hr at 09/29/18 1150     0.9 %  sodium chloride infusion   Intravenous Continuous Troy Sine, MD       0.9 %  sodium chloride infusion  250 mL Intravenous PRN Troy Sine, MD       acetaminophen (TYLENOL) tablet 650 mg  650 mg Oral Q4H PRN Troy Sine, MD       aspirin chewable tablet 81 mg  81 mg Oral Daily Troy Sine, MD       atorvastatin (LIPITOR) tablet 80 mg  80 mg Oral q1800 Troy Sine, MD       diazepam (VALIUM) tablet 5 mg  5 mg Oral Q6H PRN Troy Sine, MD       hydrALAZINE (APRESOLINE) injection 10 mg  10 mg Intravenous Q20 Min PRN Troy Sine, MD       labetalol (NORMODYNE) injection 10 mg  10 mg Intravenous Q10 min PRN Troy Sine, MD       metoprolol tartrate (LOPRESSOR) tablet 25 mg  25 mg Oral BID Troy Sine, MD       nitroGLYCERIN 50 mg in dextrose 5 % 250 mL (0.2 mg/mL) infusion  0-200 mcg/min Intravenous Titrated Troy Sine, MD       ondansetron [facility]) injection 4 mg  4 mg Intravenous Q6H PRN Troy Sine, MD       sodium chloride flush (NS) 0.9 % injection 3 mL  3 mL Intravenous Q12H Troy Sine, MD       sodium chloride flush (NS) 0.9 % injection 3 mL  3 mL Intravenous PRN Troy Sine, MD        Medications Prior to Admission  Medication Sig Dispense Refill Last Dose   Azelastine HCl 0.15 % SOLN SPRAY 2 SPRAYS INTO EACH NOSTRIL EVERY DAY  2 Taking   BESIVANCE 0.6 % SUSP Place 1 drop into the left eye 3 (three) times daily.  1 Taking   brimonidine (ALPHAGAN) 0.2 % ophthalmic solution    Taking   dorzolamide (TRUSOPT) 2 % ophthalmic solution    Taking   ibuprofen (ADVIL,MOTRIN) 200 MG tablet Take 400 mg by mouth every 6 (six) hours as needed.   Taking   insulin aspart (NOVOLOG FLEXPEN) 100 UNIT/ML FlexPen Inject 30 Units into the skin 3 (three) times daily with meals. And pen needles 3/day 30 pen 11 Taking   Insulin Glargine (LANTUS SOLOSTAR) 100 UNIT/ML Solostar Pen Inject 150 Units into the skin every morning. 60 mL 3 Taking   Insulin Pen Needle (NOVOFINE) 32G X 6 MM MISC USE 5 TIMES DAILY 300 each 3 Taking   ofloxacin (OCUFLOX) 0.3 % ophthalmic solution  Place 1 drop into both eyes. Administer 1 drop in both eyes up to 4 times a day for 2 days after monthly injections  12 Taking   ONETOUCH VERIO test strip CHECK LEVELS 4 TIMES DAILY 360 each 3 Taking   Pitavastatin Calcium 1 MG TABS Take 1 tablet (1  mg total) by mouth 2 (two) times a week. 26 tablet 3 Taking   valsartan-hydrochlorothiazide (DIOVAN-HCT) 320-25 MG tablet Take 1 tablet by mouth daily. 90 tablet 3 Taking    Family History  Problem Relation Age of Onset   Hypertension Mother    Heart disease Mother        CHF did not see doctor   Diabetes Maternal Grandmother    Diabetes Son      Review of Systems:   Review of Systems  Constitutional: Positive for diaphoresis and malaise/fatigue.  HENT:       Nasal congestion  Respiratory: Positive for cough.   Cardiovascular: Positive for chest pain and palpitations.  Gastrointestinal: Positive for nausea.  Neurological: Negative for seizures, loss of consciousness and weakness.   Constitutional: positive for fatigue, negative for chills, fevers and night sweats Eyes: positive for H/O of cataract surgery, also gets injections to both eyes for retinopathy Respiratory: negative Cardiovascular: positive for chest pain Gastrointestinal: negative Neurological: negative     Cardiac Review of Systems: Y or  [    ]= no  Chest Pain [ Y   ]  Resting SOB Aqua.Slicker  ] Exertional SOB  [Y  ]  Orthopnea [  ]   Pedal Edema [ N  ]    Palpitations [  ] Syncope  [  ]   Presyncope [   ]  General Review of Systems: [Y] = yes [  ]=no Constitional: recent weight change [  ]; anorexia [  ]; fatigue [Y  ]; nausea [  ]; night sweats [  ]; fever [  ]; or chills [  ]                                                               Dental: Last Dentist visit:   Eye : blurred vision [  ]; diplopia [   ]; vision changes [Y  ];  Amaurosis fugax[  ]; Resp: cough Aqua.Slicker  ];  wheezing[N ];  hemoptysis[  ]; shortness of breath[ N ]; paroxysmal nocturnal dyspnea[  ]; dyspnea on exertion[ Y ]; or orthopnea[  ];  GI:  gallstones[  ], vomiting[  ];  dysphagia[  ]; melena[  ];  hematochezia [  ]; heartburn[  ];   Hx of  Colonoscopy[  ]; GU: kidney stones [  ]; hematuria[  ];   dysuria [  ];  nocturia[  ];  history of      obstruction [  ]; urinary frequency [  ]             Skin: rash, swelling[  ];, hair loss[  ];  peripheral edema[  ];  or itching[  ]; Musculosketetal: myalgias[  ];  joint swelling[  ];  joint erythema[  ];  joint pain[  ];  back pain[  ];  Heme/Lymph: bruising[  ];  bleeding[  ];  anemia[  ];  Neuro: TIA[  ];  headaches[  ];  stroke[  ];  vertigo[  ];  seizures[  ];   paresthesias[  ];  difficulty walking[ Y ];  Psych:depression[  ]; anxiety[  ];  Endocrine: diabetes[ Y ];  thyroid dysfunction[  ];  Physical Exam: BP (!) 154/88    Pulse 87    Temp 98.4 F (36.9 C) (Oral)    Resp 16    Ht 6' (1.829 m)    Wt 133.8 kg    SpO2 99%    BMI 40.01 kg/m    General appearance: alert, cooperative and no distress Head: Normocephalic, without obvious abnormality, atraumatic Neck: no adenopathy, no carotid bruit, no JVD, supple, symmetrical, trachea midline and thyroid not enlarged, symmetric, no tenderness/mass/nodules Resp: clear to auscultation bilaterally Cardio: regular rate and rhythm GI: soft, non-tender; bowel sounds normal; no masses,  no organomegaly Extremities: extremities normal, atraumatic, no cyanosis or edema Neurologic: Grossly normal 2/6 systolic murmur RUSB  Diagnostic Studies & Laboratory data:     Recent Radiology Findings:   No results found.   I have independently reviewed the above radiologic studies and discussed with the patient   Recent Lab Findings: Lab Results  Component Value Date   WBC 6.0 09/29/2018   HGB 12.4 (L) 09/29/2018   HCT 38.0 (L) 09/29/2018   PLT 33 (L) 09/29/2018   GLUCOSE 256 (H) 09/29/2018   CHOL 205 (H) 09/29/2018   TRIG 195 (H) 09/29/2018   HDL 27 (L) 09/29/2018   LDLDIRECT 173.0 07/01/2018   LDLCALC 139 (H) 09/29/2018   ALT 22 09/29/2018   AST 32 09/29/2018   NA 136 09/29/2018   K 5.0 09/29/2018   CL 104 09/29/2018   CREATININE 1.10 09/29/2018   BUN 18 09/29/2018   CO2 22 09/29/2018   TSH 4.13 08/08/2015   INR 1.2 09/29/2018    HGBA1C 7.7 (A) 05/20/2018    Assessment / Plan:      1. S/P STEMI, CAD on cath( report not available)- requesting CABG consult, patient is currently chest pain free 2. H/O HTN 3. H/O Hyperlipidemia 4. DM with complications 5. Dispo- patient currently chest pain free, requesting CABG, Dr. Skipper Cliche is aware will evaluate patient and follow up with further recommendations  I  spent 55 minutes counseling the patient face to face.   Ellwood Handler, PA-C 09/29/2018 2:14 PM  I have seen and examined Mr. Soulier and reviewed his chart and cath films. I concur with the findings noted above  68 yo retired gentleman with multiple cardiac risk factors including type II DM, hypertension, hyperlipidemia and morbid obesity. He presented yesterday with substernal CP radiating to his left arm. STEMI in progress. Was taken emergently to the cath lab where he was found to have 3 vessel CAD with a thrombus in the RCA. Had good flow and pain resolved. Referred for CABG. CABG indicated for survival benefit and relief of symptoms.   I discussed the general nature of the procedure, the need for general anesthesia, the use of cardiopulmonary bypass, and the incisions to be used with Mr. Bady. We discussed the expected hospital stay, overall recovery and short and long term outcomes. I informed him of the indications, risks, benefits and alternatives.  He understands the risks include, but are not limited to death, stroke, MI, DVT/PE, bleeding, possible need for transfusion, infections, cardiac arrhythmias, as well as other organ system dysfunction including respiratory, renal, or GI complications.   He accepts the risks and agrees to proceed.  For OR tomorrow  Revonda Standard Roxan Hockey, MD [facility] [phone]

## 2018-09-29 NOTE — ED Provider Notes (Signed)
[facility] 2H CARDIOVASCULAR ICU Provider Note   CSN: [id number] Arrival date & time: 09/29/18  1137    History   Chief Complaint No chief complaint on file.   HPI Martin Eddleman [facility] is a 73 y.o. male.     HPI Patient presents as a code STEMI.  Was driving today and developed crushing chest pain.  Some left arm numbness with it.  States no known cardiac disease.  Had been doing well last few days.  No chest pain or trouble breathing before today.  No fevers or chills.  No cough.  History of hypertension and diabetes.  He is a smoker.  Met by myself in the ER since the Cath Lab was not available yet. Past Medical History:  Diagnosis Date  . Diabetes mellitus   . Hypertension     Patient Active Problem List   Diagnosis Date Noted  . STEMI involving right coronary artery ([place]) 09/29/2018  . ST elevation myocardial infarction involving right coronary artery ([place])   . Onychomycosis of toenail 02/17/2018  . Allergic rhinitis 08/20/2017  . PAD (peripheral artery disease) ([place]) 08/20/2017  . Partial tear of right Achilles tendon 02/12/2017  . Aortic atherosclerosis ([place]) 10/07/2016  . C. difficile colitis 09/25/2016  . Aspiration pneumonia ([place]) 09/25/2016  . History of skin cancer 08/15/2015  . Undiagnosed cardiac murmurs 08/15/2015  . Diabetic retinopathy ([place]) 02/21/2015  . Former smoker 08/24/2014  . Hyperlipidemia 08/24/2014  . Ingrowing toenail 07/19/2014  . Morbid obesity ([place]) 04/12/2014  . Diabetic polyneuropathy ([place]) 04/11/2009  . BACK PAIN, LUMBAR, WITH RADICULOPATHY 08/30/2008  . DM (diabetes mellitus) type II uncontrolled with eye manifestation ([place]) 02/03/2007  . Hypertension associated with diabetes ([place]) 02/03/2007    Past Surgical History:  Procedure Laterality Date  . HERNIA REPAIR     >10 years ago  . IR RADIOLOGIST EVAL & MGMT  12/24/2016  . right shoulder surgery     around 2014        Home Medications    Prior to Admission medications    Medication Sig Start Date End Date Taking? Authorizing Provider  ibuprofen (ADVIL,MOTRIN) 200 MG tablet Take 400 mg by mouth every 6 (six) hours as needed.   Yes [provider]  valsartan-hydrochlorothiazide (DIOVAN-HCT) 320-25 MG tablet Take 1 tablet by mouth daily. 07/01/18  Yes Marin Olp, MD  BESIVANCE 0.6 % SUSP Place 1 drop into the left eye 3 (three) times daily. 08/27/16   [provider]  brimonidine (ALPHAGAN) 0.2 % ophthalmic solution  11/18/17   [provider]  dorzolamide (TRUSOPT) 2 % ophthalmic solution  06/03/18   [provider]  insulin aspart (NOVOLOG FLEXPEN) 100 UNIT/ML FlexPen Inject 30 Units into the skin 3 (three) times daily with meals. And pen needles 3/day 05/20/18   Renato Shin, MD  Insulin Glargine (LANTUS SOLOSTAR) 100 UNIT/ML Solostar Pen Inject 150 Units into the skin every morning. 06/01/18   Renato Shin, MD  Insulin Pen Needle (NOVOFINE) 32G X 6 MM MISC USE 5 TIMES DAILY 01/01/18   Renato Shin, MD  ofloxacin (OCUFLOX) 0.3 % ophthalmic solution Place 1 drop into both eyes. Administer 1 drop in both eyes up to 4 times a day for 2 days after monthly injections 06/23/17   [provider]  ONETOUCH VERIO test strip CHECK LEVELS 4 TIMES DAILY 01/25/18   Renato Shin, MD  Pitavastatin Calcium 1 MG TABS Take 1 tablet (1 mg total) by mouth 2 (two) times a  week. 07/02/18   Marin Olp, MD    Family History Family History  Problem Relation Age of Onset  . Hypertension Mother   . Heart disease Mother        CHF did not see doctor  . Diabetes Maternal Grandmother   . Diabetes Son     Social History Social History   Tobacco Use  . Smoking status: Former Smoker    Last attempt to quit: 04/29/2000    Years since quitting: 18.4  . Smokeless tobacco: Never Used  . Tobacco comment: smoked for 10 years on and off  Substance Use Topics  . Alcohol use: Yes    Alcohol/week: 0.0 standard drinks    Comment: 6 martinis a  year  . Drug use: No     Allergies   Simvastatin   Review of Systems Review of Systems  Constitutional: Negative for appetite change.  HENT: Negative for congestion.   Cardiovascular: Positive for chest pain.  Gastrointestinal: Negative for abdominal pain.  Genitourinary: Negative for flank pain.  Musculoskeletal: Positive for back pain.  Skin: Negative for rash.  Neurological: Negative for weakness.  Psychiatric/Behavioral: Negative for confusion.     Physical Exam Updated Vital Signs BP (!) 154/88   Pulse 87   Temp 98.4 F (36.9 C) (Oral)   Resp 16   Ht 6' (1.829 m)   Wt 133.8 kg   SpO2 99%   BMI 40.01 kg/m   Physical Exam Vitals signs and nursing note reviewed.  Constitutional:      Comments: Patient looks mildly uncomfortable  HENT:     Head: Normocephalic.     Mouth/Throat:     Mouth: Mucous membranes are moist.  Eyes:     Extraocular Movements: Extraocular movements intact.  Neck:     Musculoskeletal: Neck supple.  Cardiovascular:     Rate and Rhythm: Regular rhythm.  Pulmonary:     Effort: Pulmonary effort is normal.  Abdominal:     Tenderness: There is no abdominal tenderness.  Musculoskeletal:     Right lower leg: No edema.     Left lower leg: No edema.  Skin:    General: Skin is warm.     Capillary Refill: Capillary refill takes less than 2 seconds.  Neurological:     General: No focal deficit present.      ED Treatments / Results  Labs (all labs ordered are listed, but only abnormal results are displayed) Labs Reviewed  APTT - Abnormal; Notable for the following components:      Result Value   aPTT 184 (*)    All other components within normal limits  LIPID PANEL - Abnormal; Notable for the following components:   Cholesterol 205 (*)    Triglycerides 195 (*)    HDL 27 (*)    LDL Cholesterol 139 (*)    All other components within normal limits  CBC WITH DIFFERENTIAL/PLATELET - Abnormal; Notable for the following components:    RBC 4.11 (*)    Hemoglobin 12.4 (*)    HCT 38.0 (*)    Platelets 33 (*)    All other components within normal limits  COMPREHENSIVE METABOLIC PANEL - Abnormal; Notable for the following components:   Glucose, Bld 256 (*)    Total Bilirubin 1.6 (*)    All other components within normal limits  SARS CORONAVIRUS 2 (HOSPITAL ORDER, [facility])  PROTIME-INR  CBC WITH DIFFERENTIAL/PLATELET  CBC  I-STAT TROPONIN, ED  I-STAT CREATININE,  ED    EKG EKG Interpretation  Date/Time:  Tuesday September 29 2018 11:40:05 EDT Ventricular Rate:  109 PR Interval:    QRS Duration: 97 QT Interval:  328 QTC Calculation: 442 R Axis:   88 Text Interpretation:  Sinus tachycardia Inferoposterior infarct, acute (RCA) Anterolateral infarct, acute Probable RV involvement, suggest recording right precordial leads >>> Acute MI <<< Confirmed by Davonna Belling [phone]) on 09/29/2018 3:18:58 PM   Radiology No results found.  Procedures Procedures (including critical care time)  Medications Ordered in ED Medications  0.9 %  sodium chloride infusion ( Intravenous New Bag/Given 09/29/18 1150)  labetalol (NORMODYNE) injection 10 mg (has no administration in time range)  hydrALAZINE (APRESOLINE) injection 10 mg (has no administration in time range)  acetaminophen (TYLENOL) tablet 650 mg (has no administration in time range)  ondansetron (ZOFRAN) injection 4 mg (has no administration in time range)  0.9 %  sodium chloride infusion ( Intravenous New Bag/Given 09/29/18 1439)  sodium chloride flush (NS) 0.9 % injection 3 mL (has no administration in time range)  sodium chloride flush (NS) 0.9 % injection 3 mL (has no administration in time range)  0.9 %  sodium chloride infusion (has no administration in time range)  nitroGLYCERIN 50 mg in dextrose 5 % 250 mL (0.2 mg/mL) infusion (0 mcg/min Intravenous Hold 09/29/18 1440)  atorvastatin (LIPITOR) tablet 80 mg (has no administration in time range)   diazepam (VALIUM) tablet 5 mg (has no administration in time range)  aspirin chewable tablet 81 mg (81 mg Oral Given by Other 09/29/18 1440)  metoprolol tartrate (LOPRESSOR) tablet 25 mg (25 mg Oral Given 09/29/18 1435)  tirofiban (AGGRASTAT) infusion 50 mcg/mL 100 mL (has no administration in time range)  heparin ADULT infusion 100 units/mL (25000 units/272m sodium chloride 0.45%) (has no administration in time range)  heparin injection 4,000 Units (4,000 Units Intravenous Given 09/29/18 1150)  0.9 %  sodium chloride infusion (50 mL/hr Intravenous New Bag/Given 09/29/18 1224)  tirofiban (AGGRASTAT) infusion 50 mcg/mL 100 mL (0.15 mcg/kg/min  133.8 kg Intravenous New Bag/Given 09/29/18 1302)     Initial Impression / Assessment and Plan / ED Course  I have reviewed the triage vital signs and the nursing notes.  Pertinent labs & imaging results that were available during my care of the patient were reviewed by me and considered in my medical decision making (see chart for details).        Patient came in as a STEMI.  Awaiting Cath Lab.  Labs placed.  Inferior ST elevation.  Transferred emergently to Cath Lab when it became available.  Final Clinical Impressions(s) / ED Diagnoses   Final diagnoses:  ST elevation myocardial infarction (STEMI), unspecified artery ([facility]    ED Discharge Orders    None       PDavonna Belling MD 09/29/18 1519

## 2018-09-30 ENCOUNTER — Inpatient Hospital Stay (HOSPITAL_COMMUNITY): Payer: PPO

## 2018-09-30 ENCOUNTER — Encounter (HOSPITAL_COMMUNITY): Payer: Self-pay | Admitting: Cardiovascular Disease

## 2018-09-30 DIAGNOSIS — Z01810 Encounter for preprocedural cardiovascular examination: Secondary | ICD-10-CM

## 2018-09-30 LAB — BASIC METABOLIC PANEL
Anion gap: 11 (ref 5–15)
BUN: 16 mg/dL (ref 8–23)
CO2: 23 mmol/L (ref 22–32)
Calcium: 8.6 mg/dL — ABNORMAL LOW (ref 8.9–10.3)
Chloride: 103 mmol/L (ref 98–111)
Creatinine, Ser: 1.09 mg/dL (ref 0.61–1.24)
GFR calc Af Amer: >60 mL/min (ref >60)
GFR calc non Af Amer: >60 mL/min (ref >60)
Glucose, Bld: 216 mg/dL — ABNORMAL HIGH (ref 70–99)
Potassium: 4.8 mmol/L (ref 3.5–5.1)
Sodium: 137 mmol/L (ref 135–145)

## 2018-09-30 LAB — GLUCOSE, CAPILLARY
Glucose-Capillary: 184 mg/dL — ABNORMAL HIGH (ref 70–99)
Glucose-Capillary: 206 mg/dL — ABNORMAL HIGH (ref 70–99)
Glucose-Capillary: 255 mg/dL — ABNORMAL HIGH (ref 70–99)
Glucose-Capillary: 260 mg/dL — ABNORMAL HIGH (ref 70–99)
Glucose-Capillary: 271 mg/dL — ABNORMAL HIGH (ref 70–99)

## 2018-09-30 LAB — URINALYSIS, ROUTINE W REFLEX MICROSCOPIC
Bilirubin Urine: NEGATIVE
Glucose, UA: 150 mg/dL — AB
Ketones, ur: NEGATIVE mg/dL
Leukocytes,Ua: NEGATIVE
Nitrite: NEGATIVE
Protein, ur: 30 mg/dL — AB
RBC / HPF: >50 RBC/hpf — ABNORMAL HIGH (ref 0–5)
Specific Gravity, Urine: 1.026 (ref 1.005–1.030)
pH: 5 (ref 5.0–8.0)

## 2018-09-30 LAB — ABO/RH: ABO/RH(D): O POS

## 2018-09-30 LAB — POCT I-STAT 7, (LYTES, BLD GAS, ICA,H+H)
Acid-base deficit: 1 mmol/L (ref 0.0–2.0)
Bicarbonate: 23.8 mmol/L (ref 20.0–28.0)
Calcium, Ion: 1.17 mmol/L (ref 1.15–1.40)
HCT: 35 % — ABNORMAL LOW (ref 39.0–52.0)
Hemoglobin: 11.9 g/dL — ABNORMAL LOW (ref 13.0–17.0)
O2 Saturation: 96 %
Patient temperature: 98.3
Potassium: 4.3 mmol/L (ref 3.5–5.1)
Sodium: 136 mmol/L (ref 135–145)
TCO2: 25 mmol/L (ref 22–32)
pCO2 arterial: 38.7 mmHg (ref 32.0–48.0)
pH, Arterial: 7.397 (ref 7.350–7.450)
pO2, Arterial: 81 mmHg — ABNORMAL LOW (ref 83.0–108.0)

## 2018-09-30 LAB — CBC
HCT: 36.8 % — ABNORMAL LOW (ref 39.0–52.0)
Hemoglobin: 12.1 g/dL — ABNORMAL LOW (ref 13.0–17.0)
MCH: 29.7 pg (ref 26.0–34.0)
MCHC: 32.9 g/dL (ref 30.0–36.0)
MCV: 90.2 fL (ref 80.0–100.0)
Platelets: 298 10*3/uL (ref 150–400)
RBC: 4.08 MIL/uL — ABNORMAL LOW (ref 4.22–5.81)
RDW: 14.2 % (ref 11.5–15.5)
WBC: 7.3 10*3/uL (ref 4.0–10.5)
nRBC: 0 % (ref 0.0–0.2)

## 2018-09-30 LAB — HEPARIN LEVEL (UNFRACTIONATED)
Heparin Unfractionated: <0.1 IU/mL — ABNORMAL LOW (ref 0.30–0.70)
Heparin Unfractionated: 0.18 IU/mL — ABNORMAL LOW (ref 0.30–0.70)
Heparin Unfractionated: 0.23 IU/mL — ABNORMAL LOW (ref 0.30–0.70)

## 2018-09-30 LAB — SURGICAL PCR SCREEN
MRSA, PCR: NEGATIVE
Staphylococcus aureus: NEGATIVE

## 2018-09-30 MED ORDER — DIAZEPAM 2 MG PO TABS
2.0000 mg | ORAL_TABLET | Freq: Once | ORAL | Status: AC
  Administered 2018-10-01: 2 mg via ORAL
  Filled 2018-09-30: qty 1

## 2018-09-30 MED ORDER — SODIUM CHLORIDE 0.9 % IV SOLN
INTRAVENOUS | Status: DC
  Filled 2018-09-30: qty 30

## 2018-09-30 MED ORDER — CHLORHEXIDINE GLUCONATE 0.12 % MT SOLN
15.0000 mL | Freq: Once | OROMUCOSAL | Status: AC
  Administered 2018-10-01: 15 mL via OROMUCOSAL
  Filled 2018-09-30: qty 15

## 2018-09-30 MED ORDER — INSULIN REGULAR(HUMAN) IN NACL 100-0.9 UT/100ML-% IV SOLN
INTRAVENOUS | Status: DC
  Filled 2018-09-30: qty 100

## 2018-09-30 MED ORDER — POTASSIUM CHLORIDE 2 MEQ/ML IV SOLN
80.0000 meq | INTRAVENOUS | Status: DC
  Filled 2018-09-30: qty 40

## 2018-09-30 MED ORDER — TRANEXAMIC ACID (OHS) BOLUS VIA INFUSION
15.0000 mg/kg | INTRAVENOUS | Status: DC
  Filled 2018-09-30: qty 2013

## 2018-09-30 MED ORDER — TEMAZEPAM 15 MG PO CAPS
15.0000 mg | ORAL_CAPSULE | Freq: Once | ORAL | Status: AC | PRN
  Administered 2018-09-30: 15 mg via ORAL
  Filled 2018-09-30: qty 1

## 2018-09-30 MED ORDER — SODIUM CHLORIDE 0.9 % IV SOLN
750.0000 mg | INTRAVENOUS | Status: DC
  Filled 2018-09-30: qty 750

## 2018-09-30 MED ORDER — DOPAMINE-DEXTROSE 3.2-5 MG/ML-% IV SOLN
0.0000 ug/kg/min | INTRAVENOUS | Status: DC
  Filled 2018-09-30: qty 250

## 2018-09-30 MED ORDER — CHLORHEXIDINE GLUCONATE CLOTH 2 % EX PADS: 6.0000 | MEDICATED_PAD | Freq: Every day | CUTANEOUS | Status: DC

## 2018-09-30 MED ORDER — EPINEPHRINE PF 1 MG/ML IJ SOLN
0.0000 ug/min | INTRAVENOUS | Status: DC
  Filled 2018-09-30: qty 4

## 2018-09-30 MED ORDER — ROSUVASTATIN CALCIUM 20 MG PO TABS
40.0000 mg | ORAL_TABLET | Freq: Every day | ORAL | Status: DC
  Administered 2018-09-30: 40 mg via ORAL
  Filled 2018-09-30: qty 2

## 2018-09-30 MED ORDER — MILRINONE LACTATE IN DEXTROSE 20-5 MG/100ML-% IV SOLN
0.3000 ug/kg/min | INTRAVENOUS | Status: DC
  Filled 2018-09-30: qty 100

## 2018-09-30 MED ORDER — SODIUM CHLORIDE 0.9 % IV SOLN
1.5000 g | INTRAVENOUS | Status: DC
  Filled 2018-09-30: qty 1.5

## 2018-09-30 MED ORDER — MAGNESIUM SULFATE 50 % IJ SOLN
40.0000 meq | INTRAMUSCULAR | Status: DC
  Filled 2018-09-30: qty 9.85

## 2018-09-30 MED ORDER — VANCOMYCIN HCL 10 G IV SOLR
1500.0000 mg | INTRAVENOUS | Status: DC
  Filled 2018-09-30: qty 1500

## 2018-09-30 MED ORDER — BISACODYL 5 MG PO TBEC
5.0000 mg | DELAYED_RELEASE_TABLET | Freq: Once | ORAL | Status: AC
  Administered 2018-09-30: 5 mg via ORAL
  Filled 2018-09-30: qty 1

## 2018-09-30 MED ORDER — NITROGLYCERIN IN D5W 200-5 MCG/ML-% IV SOLN
2.0000 ug/min | INTRAVENOUS | Status: DC
  Filled 2018-09-30: qty 250

## 2018-09-30 MED ORDER — PHENYLEPHRINE HCL-NACL 20-0.9 MG/250ML-% IV SOLN
30.0000 ug/min | INTRAVENOUS | Status: DC
  Filled 2018-09-30: qty 250

## 2018-09-30 MED ORDER — CHLORHEXIDINE GLUCONATE CLOTH 2 % EX PADS
6.0000 | MEDICATED_PAD | Freq: Once | CUTANEOUS | Status: AC
  Administered 2018-09-30: 6 via TOPICAL

## 2018-09-30 MED ORDER — DEXMEDETOMIDINE HCL IN NACL 400 MCG/100ML IV SOLN
0.1000 ug/kg/h | INTRAVENOUS | Status: DC
  Filled 2018-09-30: qty 100

## 2018-09-30 MED ORDER — NOREPINEPHRINE 4 MG/250ML-% IV SOLN
0.0000 ug/min | INTRAVENOUS | Status: DC
  Filled 2018-09-30: qty 250

## 2018-09-30 MED ORDER — METOPROLOL TARTRATE 12.5 MG HALF TABLET
12.5000 mg | ORAL_TABLET | Freq: Once | ORAL | Status: AC
  Administered 2018-10-01: 12.5 mg via ORAL
  Filled 2018-09-30: qty 1

## 2018-09-30 MED ORDER — TRANEXAMIC ACID 1000 MG/10ML IV SOLN
1.5000 mg/kg/h | INTRAVENOUS | Status: DC
  Filled 2018-09-30: qty 25

## 2018-09-30 MED ORDER — ALPRAZOLAM 0.25 MG PO TABS
0.2500 mg | ORAL_TABLET | ORAL | Status: DC | PRN
  Administered 2018-09-30: 0.25 mg via ORAL
  Filled 2018-09-30: qty 1

## 2018-09-30 MED ORDER — HYDRALAZINE HCL 20 MG/ML IJ SOLN
10.0000 mg | INTRAMUSCULAR | Status: DC | PRN
  Administered 2018-09-30: 10 mg via INTRAVENOUS
  Filled 2018-09-30: qty 1

## 2018-09-30 MED ORDER — CHLORHEXIDINE GLUCONATE CLOTH 2 % EX PADS: 6.0000 | MEDICATED_PAD | Freq: Once | CUTANEOUS | Status: AC

## 2018-09-30 MED ORDER — TRANEXAMIC ACID (OHS) PUMP PRIME SOLUTION
2.0000 mg/kg | INTRAVENOUS | Status: DC
  Filled 2018-09-30: qty 2.68

## 2018-09-30 MED ORDER — PLASMA-LYTE 148 IV SOLN
INTRAVENOUS | Status: DC
  Filled 2018-09-30: qty 2.5

## 2018-09-30 NOTE — Progress Notes (Signed)
[facility] for Heparin   Indication: chest pain/ACS  Allergies  Allergen Reactions  . Simvastatin Diarrhea    Patient Measurements: Height: 6' (182.9 cm) Weight: 295 lb (133.8 kg) IBW/kg (Calculated) : 77.6 Heparin Dosing Weight: 108 kg  Vital Signs: Temp: 98.5 F (36.9 C) (06/02 2352) Temp Source: Oral (06/02 2352) BP: 128/64 (06/03 0300) Pulse Rate: 71 (06/03 0300)  Labs: Recent Labs    09/29/18 1155 09/29/18 1200 09/29/18 1523 09/29/18 1821 09/30/18 0242  HGB  --   --  12.0* 10.1* 12.1*  HCT  --   --  35.6* 31.1* 36.8*  PLT  --   --  286 237 298  APTT 184*  --   --   --   --   LABPROT 14.7  --   --   --   --   INR 1.2  --   --   --   --   HEPARINUNFRC  --   --   --   --  <0.10*  CREATININE  --  1.10  --  0.89 1.09    Estimated Creatinine Clearance: 84.2 mL/min (by C-G formula based on SCr of 1.09 mg/dL).   Medical History: Past Medical History:  Diagnosis Date  . Diabetes mellitus   . Hypertension     Medications:  Scheduled:  . aspirin EC  81 mg Oral Daily  . atorvastatin  80 mg Oral q1800  . irbesartan  300 mg Oral Daily   And  . hydrochlorothiazide  25 mg Oral Daily  . insulin aspart  0-15 Units Subcutaneous TID WC  . metoprolol tartrate  25 mg Oral BID  . sodium chloride flush  3 mL Intravenous Q12H    Assessment: Martin [name] is a 73 year old male who presented to the hospital today for chest pain + suspected STEMI. He is not on any anticoagulation prior to admission. Patient has PMH significant for DM and CAD. Possible surgical intervention later this week. Pharmacy consulted for heparin and tirofiban infusion. Initial heparin level <0.1 units/ml  PTLC 298, Hg 12.1  Goal of Therapy:  Heparin level 0.2-0.5 units/ml Monitor platelets by anticoagulation protocol: Yes   Plan:  Increase heparin infusion to 1300 units/hr Heparin level 6-8 hours after rate change     Thanks for allowing pharmacy to be a  part of this patient's care.  Excell Seltzer, PharmD Clinical Pharmacist

## 2018-09-30 NOTE — Progress Notes (Signed)
[name] for Heparin   Indication: chest pain/ACS  Allergies  Allergen Reactions  . Simvastatin Diarrhea    Patient Measurements: Height: 6' (182.9 cm) Weight: 295 lb 13.7 oz (134.2 kg) IBW/kg (Calculated) : 77.6 Heparin Dosing Weight: 108 kg  Vital Signs: Temp: 98.3 F (36.8 C) (06/03 1507) Temp Source: Oral (06/03 1507) BP: 156/70 (06/03 2000) Pulse Rate: 79 (06/03 2000)  Labs: Recent Labs    09/29/18 1155 09/29/18 1200 09/29/18 1523 09/29/18 1821 09/30/18 0242 09/30/18 1049 09/30/18 1050 09/30/18 1817  HGB  --   --  12.0* 10.1* 12.1* 11.9*  --   --   HCT  --   --  35.6* 31.1* 36.8* 35.0*  --   --   PLT  --   --  286 237 298  --   --   --   APTT 184*  --   --   --   --   --   --   --   LABPROT 14.7  --   --   --   --   --   --   --   INR 1.2  --   --   --   --   --   --   --   HEPARINUNFRC  --   --   --   --  <0.10*  --  0.18* 0.23*  CREATININE  --  1.10  --  0.89 1.09  --   --   --     Estimated Creatinine Clearance: 84.3 mL/min (by C-G formula based on SCr of 1.09 mg/dL).   Medical History: Past Medical History:  Diagnosis Date  . Diabetes mellitus   . Hypertension     Medications:  Scheduled:  . aspirin EC  81 mg Oral Daily  . [START ON 10/01/2018] heparin-papaverine-plasmalyte irrigation   Irrigation To OR  . irbesartan  300 mg Oral Daily   And  . hydrochlorothiazide  25 mg Oral Daily  . insulin aspart  0-15 Units Subcutaneous TID WC  . [START ON 10/01/2018] insulin   Intravenous To OR  . [START ON 10/01/2018] magnesium sulfate  40 mEq Other To OR  . metoprolol tartrate  25 mg Oral BID  . [START ON 10/01/2018] phenylephrine  30-200 mcg/min Intravenous To OR  . [START ON 10/01/2018] potassium chloride  80 mEq Other To OR  . rosuvastatin  40 mg Oral q1800  . sodium chloride flush  3 mL Intravenous Q12H  . [START ON 10/01/2018] tranexamic acid  15 mg/kg Intravenous To OR  . [START ON 10/01/2018] tranexamic acid  2 mg/kg  Intracatheter To OR    Assessment: TRUE Martin [name] is a 73 year old male who presented to the hospital today for chest pain + suspected STEMI. He is not on any anticoagulation prior to admission. Patient has PMH significant for DM and CAD. Patient is on heparin and tirofiban infusion and CABG surgery planned for tomorrow at 0745.  Heparin level continues to be subtherapeutic at 0.23 this evening. Hgb 11.9, hct 35, platelets 298. No signs or symptoms of bleeding noted.   Goal of Therapy:  Heparin level 0.3 - 0.5 units/ml Monitor platelets by anticoagulation protocol: Yes   Plan:  Increase heparin infusion to 1800 units/hr Stop time for tirofiban infusion 4 hours prior to surgery @ 0345 per Dr. Julianne Handler     Thank you for the interesting consult and for involving pharmacy in this patient's care.  Pilar Plate  Redmond Pulling PharmD., BCPS Clinical Pharmacist 09/30/2018 8:30 PM

## 2018-09-30 NOTE — Progress Notes (Signed)
Pre-CABG education started with patient.  IS explained to patient and patient verbalized understanding and demonstrated proper IS use.  Patient education packet given to patient and RN discussed with patient that post surgery patient will come back to ICU with a ETT in place and that the goal is to have patient extubated within 6 hours of surgery, discussed with patient mobility expectations post op and IS utilization post op.  Patient did not wish to watch educational videos at this time.  RN encouraged patient to read patient education booklet.

## 2018-09-30 NOTE — Anesthesia Preprocedure Evaluation (Addendum)
Anesthesia Evaluation  Patient identified by MRN, date of birth, ID band Patient awake    Reviewed: Allergy & Precautions, NPO status , Patient's Chart, lab work & pertinent test results  History of Anesthesia Complications (+) history of anesthetic complications  Airway Mallampati: III  TM Distance: >3 FB Neck ROM: Full    Dental  (+) Dental Advisory Given, Teeth Intact   Pulmonary former smoker,    breath sounds clear to auscultation       Cardiovascular hypertension, Pt. on medications + Past MI and + Peripheral Vascular Disease   Rhythm:Regular Rate:Normal   '20 TTE -  EF 55-60%. Mild concentric left ventricular hypertrophy. Left ventricular diastolic Doppler parameters are consistent with impaired relaxation. Left atrial size was mildly dilated.   '20 Carotid US - 40-59% right ICAS, 1-39% left ICAS  '20 Cath -  Mid RCA lesion is 90% stenosed.  Ost RPDA lesion is 30% stenosed.  RPDA-1 lesion is 40% stenosed.  RPDA-2 lesion is 50% stenosed.  RPDA-3 lesion is 100% stenosed.  Ost LAD lesion is 95% stenosed.  Mid Cx to Dist Cx lesion is 90% stenosed.  Ost 1st Diag lesion is 60% stenosed.  Prox LAD to Mid LAD lesion is 40% stenosed.  Mid LAD lesion is 30% stenosed.  Ost LM to Mid LM lesion is 30% stenosed.   Acute inferior STEMI secondary to ulcerated plaque in the mid RCA with residual 90% stenosis with extensive thrombus but with brisk TIMI-3 flow and probable distal embolization to the apical portion of the PDA vessel.  There is moderate stenoses in the PDA proximal to the distal embolization. Significant concomitant CAD with 30% left main stenosis; 95% near ostial LAD stenosis, 60% diagonal stenosis with 40 and 30% diffuse mid LAD stenosis; normal high marginal ramus like vessel, with 90% diffuse stenosis in the proximal portion of a distal marginal branch prior to its bifurcation.    Neuro/Psych negative  neurological ROS  negative psych ROS   GI/Hepatic negative GI ROS, Neg liver ROS,   Endo/Other  diabetes, Type 2, Insulin DependentMorbid obesity  Renal/GU negative Renal ROS     Musculoskeletal negative musculoskeletal ROS (+)   Abdominal (+) + obese,   Peds  Hematology negative hematology ROS (+)   Anesthesia Other Findings   Reproductive/Obstetrics                            Anesthesia Physical Anesthesia Plan  ASA: IV  Anesthesia Plan: General   Post-op Pain Management:    Induction: Intravenous  PONV Risk Score and Plan: 2 and Treatment may vary due to age or medical condition and Ondansetron  Airway Management Planned: Oral ETT  Additional Equipment: Arterial line, CVP, PA Cath, TEE and Ultrasound Guidance Line Placement  Intra-op Plan:   Post-operative Plan: Post-operative intubation/ventilation  Informed Consent: I have reviewed the patients History and Physical, chart, labs and discussed the procedure including the risks, benefits and alternatives for the proposed anesthesia with the patient or authorized representative who has indicated his/her understanding and acceptance.     Dental advisory given  Plan Discussed with: CRNA and Anesthesiologist  Anesthesia Plan Comments:        Anesthesia Quick Evaluation

## 2018-09-30 NOTE — Progress Notes (Signed)
Progress Note  Patient Name: Jazmin Vensel [facility] Sr. Date of Encounter: 09/30/2018  Primary Cardiologist: Claiborne Billings  Subjective   No chest pain or dyspnea.   Inpatient Medications    Scheduled Meds: . aspirin EC  81 mg Oral Daily  . atorvastatin  80 mg Oral q1800  . [START ON 10/01/2018] heparin-papaverine-plasmalyte irrigation   Irrigation To OR  . irbesartan  300 mg Oral Daily   And  . hydrochlorothiazide  25 mg Oral Daily  . insulin aspart  0-15 Units Subcutaneous TID WC  . [START ON 10/01/2018] insulin   Intravenous To OR  . [START ON 10/01/2018] magnesium sulfate  40 mEq Other To OR  . metoprolol tartrate  25 mg Oral BID  . [START ON 10/01/2018] phenylephrine  30-200 mcg/min Intravenous To OR  . [START ON 10/01/2018] potassium chloride  80 mEq Other To OR  . sodium chloride flush  3 mL Intravenous Q12H  . [START ON 10/01/2018] tranexamic acid  15 mg/kg Intravenous To OR  . [START ON 10/01/2018] tranexamic acid  2 mg/kg Intracatheter To OR   Continuous Infusions: . sodium chloride 10 mL/hr at 09/29/18 1150  . sodium chloride Stopped (09/29/18 1957)  . sodium chloride    . [START ON 10/01/2018] cefUROXime (ZINACEF)  IV    . [START ON 10/01/2018] cefUROXime (ZINACEF)  IV    . [START ON 10/01/2018] dexmedetomidine    . [START ON 10/01/2018] DOPamine    . [START ON 10/01/2018] epinephrine    . [START ON 10/01/2018] heparin 30,000 units/NS 1000 mL solution for CELLSAVER    . heparin 1,300 Units/hr (09/30/18 0600)  . [START ON 10/01/2018] milrinone    . nitroGLYCERIN Stopped (09/29/18 1440)  . [START ON 10/01/2018] nitroGLYCERIN    . [START ON 10/01/2018] norepinephrine (LEVOPHED) Adult infusion    . tirofiban 0.15 mcg/kg/min (09/30/18 0813)  . [START ON 10/01/2018] tranexamic acid (CYKLOKAPRON) infusion ([facility])    . [START ON 10/01/2018] vancomycin     PRN Meds: sodium chloride, acetaminophen, diazepam, nitroGLYCERIN, ondansetron (ZOFRAN) IV, sodium chloride flush   Vital Signs    Vitals:   09/30/18 0600  09/30/18 0647 09/30/18 0700 09/30/18 0801  BP: (!) 133/57  140/65   Pulse: 75  77   Resp: (!) 28  (!) 26   Temp:  97.7 F (36.5 C)  98.3 F (36.8 C)  TempSrc:  Oral  Oral  SpO2: 95%  97%   Weight:      Height:        Intake/Output Summary (Last 24 hours) at 09/30/2018 0856 Last data filed at 09/30/2018 0600 Gross per 24 hour  Intake 1091.18 ml  Output 1100 ml  Net -8.82 ml   Last 3 Weights 09/30/2018 09/29/2018 09/29/2018  Weight (lbs) 295 lb 13.7 oz 295 lb 293 lb 3.4 oz  Weight (kg) 134.2 kg 133.811 kg 133 kg      Telemetry    sinus - Personally Reviewed  ECG    Sinus, 1 mm ST elevation inferior leads - Personally Reviewed  Physical Exam   GEN: No acute distress.   Neck: No JVD Cardiac: RRR, no murmurs, rubs, or gallops.  Respiratory: Clear to auscultation bilaterally. GI: Soft, nontender, non-distended  MS: No edema; No deformity. Neuro:  Nonfocal  Psych: Normal affect   Labs    Chemistry Recent Labs  Lab 09/29/18 1140 09/29/18 1200 09/29/18 1821 09/30/18 0242  NA 136  --   --  137  K 5.0  --   --  4.8  CL 104  --   --  103  CO2 22  --   --  23  GLUCOSE 256*  --   --  216*  BUN 18  --   --  16  CREATININE 1.18 1.10 0.89 1.09  CALCIUM 9.0  --   --  8.6*  PROT 7.3  --   --   --   ALBUMIN 3.9  --   --   --   AST 32  --   --   --   ALT 22  --   --   --   ALKPHOS 51  --   --   --   BILITOT 1.6*  --   --   --   GFRNONAA >60  --  >60 >60  GFRAA >60  --  >60 >60  ANIONGAP 10  --   --  11     Hematology Recent Labs  Lab 09/29/18 1523 09/29/18 1821 09/30/18 0242  WBC 7.3 5.9 7.3  RBC 4.05* 3.43* 4.08*  HGB 12.0* 10.1* 12.1*  HCT 35.6* 31.1* 36.8*  MCV 87.9 90.7 90.2  MCH 29.6 29.4 29.7  MCHC 33.7 32.5 32.9  RDW 14.0 14.1 14.2  PLT 286 237 298    Cardiac EnzymesNo results for input(s): TROPONINI in the last 168 hours.  Recent Labs  Lab 09/29/18 1158  TROPIPOC 0.00     BNPNo results for input(s): BNP, PROBNP in the last 168 hours.   DDimer  No results for input(s): DDIMER in the last 168 hours.   Radiology    Dg Chest Port 1 View  Result Date: 09/29/2018 CLINICAL DATA:  Chest pain for 1 day. EXAM: PORTABLE CHEST 1 VIEW COMPARISON:  10/07/2017. FINDINGS: Borderline cardiac enlargement. Slight subsegmental atelectasis retrocardiac region. No consolidation or edema. No effusion or pneumothorax. Bones unremarkable. IMPRESSION: Slight subsegmental atelectasis retrocardiac region. No consolidation or edema. Electronically Signed   By: Staci Righter M.D.   On: 09/29/2018 16:58    Cardiac Studies   Cardiac cath 09/29/18:  Mid RCA lesion is 90% stenosed.  Ost RPDA lesion is 30% stenosed.  RPDA-1 lesion is 40% stenosed.  RPDA-2 lesion is 50% stenosed.  RPDA-3 lesion is 100% stenosed.  Ost LAD lesion is 95% stenosed.  Mid Cx to Dist Cx lesion is 90% stenosed.  Ost 1st Diag lesion is 60% stenosed.  Prox LAD to Mid LAD lesion is 40% stenosed.  Mid LAD lesion is 30% stenosed.  Ost LM to Mid LM lesion is 30% stenosed.   Acute inferior STEMI secondary to ulcerated plaque in the mid RCA with residual 90% stenosis with extensive thrombus but with brisk TIMI-3 flow and probable distal embolization to the apical portion of the PDA vessel.  There is moderate stenoses in the PDA proximal to the distal embolization.  Significant concomitant CAD with 30% left main stenosis; 95% near ostial LAD stenosis, 60% diagonal stenosis with 40 and 30% diffuse mid LAD stenosis; normal high marginal ramus like vessel, with 90% diffuse stenosis in the proximal portion of a distal marginal branch prior to its bifurcation.  LVEDP 11 mm.  Transient development of tachycardia most likely paroxysmal atrial fibrillation with ventricular rate at 150 bpm which responded to metoprolol IV 2.5 mg x 2.  RECOMMENDATION: Angiograms were reviewed with Dr. Irish Lack as well as with Dr. Tharon Aquas Tright.  Although the culprit vessel is in the mid RCA which can be  intervened upon, with the high-grade subtotal near  ostial LAD stenosis in an area that may be difficult to stent due to potential jeopardy to a ramus-like vessel in the circumflex vessel in addition to the patient's concomitant CAD it was felt that the patient should undergo elective CABG revascularization following stabilization.  As result he was started on Aggrastat bolus plus infusion and with plans to resume heparin.  An echo Doppler study will be done to reevaluate LV function.  He will be started on low-dose beta-blocker therapy, high potency statin therapy, with plans for CABG revascularization surgery later this week.  Echo 09/29/18:  1. The left ventricle has normal systolic function, with an ejection fraction of 55-60%. The cavity size was normal. There is mild concentric left ventricular hypertrophy. Left ventricular diastolic Doppler parameters are consistent with impaired  relaxation. Elevated mean left atrial pressure No evidence of left ventricular regional wall motion abnormalities.  2. Left atrial size was mildly dilated.  3. There is mild mitral annular calcification present.  4. The aortic valve is tricuspid. Mild thickening of the aortic valve. Mild calcification of the aortic valve.  5. The interatrial septum was not well visualized.  Patient Profile     73 y.o. male with history of DM, HTN, tobacco abuse admitted with inferior STEMI. Cardiac cath 09/29/18 with severe three vessel CAD and thrombus in the RCA with 90% occlusion of the vessel. Plans for CABG 10/01/18.   Assessment & Plan    1. CAD/Inferior STEMI: Pt with three vessel CAD on cath 09/29/18. Presented as inferior STEMI. No PCI performed. Plans for CABG 10/01/18. Will continue ASA, statin and beta blocker. Will continue IV heparin and IV Aggrastat until he goes to the OR.   2. DM: SSI  3. HTN: BP controlled. Will add hydralazine prn.   For questions or updates, please contact [place] Please consult [email or web address]  for contact info under        Signed, Lauree Chandler, MD  09/30/2018, 8:56 AM

## 2018-09-30 NOTE — Progress Notes (Signed)
Pre CABG vascular eval       has been completed. Preliminary results can be found under CV proc through chart review. June Leap, BS, RDMS, RVT

## 2018-09-30 NOTE — Progress Notes (Addendum)
[place] for Heparin   Indication: chest pain/ACS  Allergies  Allergen Reactions  . Simvastatin Diarrhea    Patient Measurements: Height: 6' (182.9 cm) Weight: 295 lb 13.7 oz (134.2 kg) IBW/kg (Calculated) : 77.6 Heparin Dosing Weight: 108 kg  Vital Signs: Temp: 98.4 F (36.9 C) (06/03 1111) Temp Source: Oral (06/03 1111) BP: 124/64 (06/03 1100) Pulse Rate: 80 (06/03 1100)  Labs: Recent Labs    09/29/18 1155 09/29/18 1200 09/29/18 1523 09/29/18 1821 09/30/18 0242 09/30/18 1049 09/30/18 1050  HGB  --   --  12.0* 10.1* 12.1* 11.9*  --   HCT  --   --  35.6* 31.1* 36.8* 35.0*  --   PLT  --   --  286 237 298  --   --   APTT 184*  --   --   --   --   --   --   LABPROT 14.7  --   --   --   --   --   --   INR 1.2  --   --   --   --   --   --   HEPARINUNFRC  --   --   --   --  <0.10*  --  0.18*  CREATININE  --  1.10  --  0.89 1.09  --   --     Estimated Creatinine Clearance: 84.3 mL/min (by C-G formula based on SCr of 1.09 mg/dL).   Medical History: Past Medical History:  Diagnosis Date  . Diabetes mellitus   . Hypertension     Medications:  Scheduled:  . aspirin EC  81 mg Oral Daily  . [START ON 10/01/2018] heparin-papaverine-plasmalyte irrigation   Irrigation To OR  . irbesartan  300 mg Oral Daily   And  . hydrochlorothiazide  25 mg Oral Daily  . insulin aspart  0-15 Units Subcutaneous TID WC  . [START ON 10/01/2018] insulin   Intravenous To OR  . [START ON 10/01/2018] magnesium sulfate  40 mEq Other To OR  . metoprolol tartrate  25 mg Oral BID  . [START ON 10/01/2018] phenylephrine  30-200 mcg/min Intravenous To OR  . [START ON 10/01/2018] potassium chloride  80 mEq Other To OR  . rosuvastatin  40 mg Oral q1800  . sodium chloride flush  3 mL Intravenous Q12H  . [START ON 10/01/2018] tranexamic acid  15 mg/kg Intravenous To OR  . [START ON 10/01/2018] tranexamic acid  2 mg/kg Intracatheter To OR    Assessment: Martin [name] is a  73 year old male who presented to the hospital today for chest pain + suspected STEMI. He is not on any anticoagulation prior to admission. Patient has PMH significant for DM and CAD. Patient is on heparin and tirofiban infusion and CABG surgery planned for tomorrow at 0745.  Heparin level returned subtherapeutic at 0.18 this morning. Hgb 11.9, hct 35, platelets 298.  Will increase by 3 units / kg / hour. Patient doing well this morning. No signs or symptoms of bleeding noted.   Goal of Therapy:  Heparin level 0.3 - 0.5 units/ml Monitor platelets by anticoagulation protocol: Yes   Plan:  Increase heparin infusion to 1650 units/hr Heparin level 6 hours after rate change @ 1600 Stop time for tirofiban infusion 4 hours prior to surgery @ 0345 per Dr. Julianne Handler      Thank you for the interesting consult and for involving pharmacy in this patient's care.  [employer],  PharmD 09/30/2018 12:09 PM PGY-1 Pharmacy Resident Direct Phone: [phone] Please check [email or web address] for unit-specific pharmacist phone numbers

## 2018-10-01 ENCOUNTER — Other Ambulatory Visit: Payer: Self-pay

## 2018-10-01 ENCOUNTER — Inpatient Hospital Stay (HOSPITAL_COMMUNITY): Payer: PPO | Admitting: Certified Registered"

## 2018-10-01 ENCOUNTER — Inpatient Hospital Stay (HOSPITAL_COMMUNITY)
Admission: EM | Disposition: A | Discharge status: Skilled Nursing Facility | Payer: Self-pay | Source: Home / Self Care | Attending: Thoracic Surgery (Cardiothoracic Vascular Surgery)

## 2018-10-01 ENCOUNTER — Inpatient Hospital Stay (HOSPITAL_COMMUNITY): Payer: PPO

## 2018-10-01 DIAGNOSIS — I219 Acute myocardial infarction, unspecified: Secondary | ICD-10-CM

## 2018-10-01 DIAGNOSIS — Z951 Presence of aortocoronary bypass graft: Secondary | ICD-10-CM

## 2018-10-01 DIAGNOSIS — I25110 Atherosclerotic heart disease of native coronary artery with unstable angina pectoris: Secondary | ICD-10-CM

## 2018-10-01 HISTORY — PX: CORONARY ARTERY BYPASS GRAFT: SHX141

## 2018-10-01 HISTORY — DX: Acute myocardial infarction, unspecified: I21.9

## 2018-10-01 HISTORY — PX: TEE WITHOUT CARDIOVERSION: SHX5443

## 2018-10-01 LAB — APTT: aPTT: 27 seconds (ref 24–36)

## 2018-10-01 LAB — POCT I-STAT 4, (NA,K, GLUC, HGB,HCT)
Glucose, Bld: 296 mg/dL — ABNORMAL HIGH (ref 70–99)
Glucose, Bld: 269 mg/dL — ABNORMAL HIGH (ref 70–99)
Glucose, Bld: 221 mg/dL — ABNORMAL HIGH (ref 70–99)
Glucose, Bld: 223 mg/dL — ABNORMAL HIGH (ref 70–99)
Glucose, Bld: 223 mg/dL — ABNORMAL HIGH (ref 70–99)
Glucose, Bld: 225 mg/dL — ABNORMAL HIGH (ref 70–99)
Glucose, Bld: 217 mg/dL — ABNORMAL HIGH (ref 70–99)
Glucose, Bld: 221 mg/dL — ABNORMAL HIGH (ref 70–99)
Glucose, Bld: 194 mg/dL — ABNORMAL HIGH (ref 70–99)
HCT: 33 % — ABNORMAL LOW (ref 39.0–52.0)
HCT: 28 % — ABNORMAL LOW (ref 39.0–52.0)
HCT: 22 % — ABNORMAL LOW (ref 39.0–52.0)
HCT: 22 % — ABNORMAL LOW (ref 39.0–52.0)
HCT: 21 % — ABNORMAL LOW (ref 39.0–52.0)
HCT: 22 % — ABNORMAL LOW (ref 39.0–52.0)
HCT: 22 % — ABNORMAL LOW (ref 39.0–52.0)
HCT: 23 % — ABNORMAL LOW (ref 39.0–52.0)
HCT: 24 % — ABNORMAL LOW (ref 39.0–52.0)
Hemoglobin: 11.2 g/dL — ABNORMAL LOW (ref 13.0–17.0)
Hemoglobin: 9.5 g/dL — ABNORMAL LOW (ref 13.0–17.0)
Hemoglobin: 7.5 g/dL — ABNORMAL LOW (ref 13.0–17.0)
Hemoglobin: 7.5 g/dL — ABNORMAL LOW (ref 13.0–17.0)
Hemoglobin: 7.1 g/dL — ABNORMAL LOW (ref 13.0–17.0)
Hemoglobin: 7.5 g/dL — ABNORMAL LOW (ref 13.0–17.0)
Hemoglobin: 7.5 g/dL — ABNORMAL LOW (ref 13.0–17.0)
Hemoglobin: 7.8 g/dL — ABNORMAL LOW (ref 13.0–17.0)
Hemoglobin: 8.2 g/dL — ABNORMAL LOW (ref 13.0–17.0)
Potassium: 4.4 mmol/L (ref 3.5–5.1)
Potassium: 4.1 mmol/L (ref 3.5–5.1)
Potassium: 4 mmol/L (ref 3.5–5.1)
Potassium: 4.6 mmol/L (ref 3.5–5.1)
Potassium: 4.5 mmol/L (ref 3.5–5.1)
Potassium: 5.2 mmol/L — ABNORMAL HIGH (ref 3.5–5.1)
Potassium: 4.5 mmol/L (ref 3.5–5.1)
Potassium: 4.9 mmol/L (ref 3.5–5.1)
Potassium: 4.5 mmol/L (ref 3.5–5.1)
Sodium: 135 mmol/L (ref 135–145)
Sodium: 135 mmol/L (ref 135–145)
Sodium: 137 mmol/L (ref 135–145)
Sodium: 137 mmol/L (ref 135–145)
Sodium: 136 mmol/L (ref 135–145)
Sodium: 136 mmol/L (ref 135–145)
Sodium: 136 mmol/L (ref 135–145)
Sodium: 137 mmol/L (ref 135–145)
Sodium: 138 mmol/L (ref 135–145)

## 2018-10-01 LAB — POCT I-STAT 7, (LYTES, BLD GAS, ICA,H+H)
Acid-base deficit: 4 mmol/L — ABNORMAL HIGH (ref 0.0–2.0)
Acid-base deficit: 4 mmol/L — ABNORMAL HIGH (ref 0.0–2.0)
Acid-base deficit: 3 mmol/L — ABNORMAL HIGH (ref 0.0–2.0)
Acid-base deficit: 4 mmol/L — ABNORMAL HIGH (ref 0.0–2.0)
Bicarbonate: 26.5 mmol/L (ref 20.0–28.0)
Bicarbonate: 23 mmol/L (ref 20.0–28.0)
Bicarbonate: 22.2 mmol/L (ref 20.0–28.0)
Bicarbonate: 22 mmol/L (ref 20.0–28.0)
Bicarbonate: 21.5 mmol/L (ref 20.0–28.0)
Calcium, Ion: 1.02 mmol/L — ABNORMAL LOW (ref 1.15–1.40)
Calcium, Ion: 1.11 mmol/L — ABNORMAL LOW (ref 1.15–1.40)
Calcium, Ion: 1.08 mmol/L — ABNORMAL LOW (ref 1.15–1.40)
Calcium, Ion: 1.08 mmol/L — ABNORMAL LOW (ref 1.15–1.40)
Calcium, Ion: 1.1 mmol/L — ABNORMAL LOW (ref 1.15–1.40)
HCT: 26 % — ABNORMAL LOW (ref 39.0–52.0)
HCT: 26 % — ABNORMAL LOW (ref 39.0–52.0)
HCT: 24 % — ABNORMAL LOW (ref 39.0–52.0)
HCT: 23 % — ABNORMAL LOW (ref 39.0–52.0)
HCT: 22 % — ABNORMAL LOW (ref 39.0–52.0)
Hemoglobin: 8.8 g/dL — ABNORMAL LOW (ref 13.0–17.0)
Hemoglobin: 8.8 g/dL — ABNORMAL LOW (ref 13.0–17.0)
Hemoglobin: 8.2 g/dL — ABNORMAL LOW (ref 13.0–17.0)
Hemoglobin: 7.8 g/dL — ABNORMAL LOW (ref 13.0–17.0)
Hemoglobin: 7.5 g/dL — ABNORMAL LOW (ref 13.0–17.0)
O2 Saturation: 100 %
O2 Saturation: 100 %
O2 Saturation: 97 %
O2 Saturation: 97 %
O2 Saturation: 95 %
Patient temperature: 36.8
Patient temperature: 37.6
Patient temperature: 37.5
Potassium: 4.1 mmol/L (ref 3.5–5.1)
Potassium: 4.5 mmol/L (ref 3.5–5.1)
Potassium: 4.6 mmol/L (ref 3.5–5.1)
Potassium: 4.5 mmol/L (ref 3.5–5.1)
Potassium: 4.3 mmol/L (ref 3.5–5.1)
Sodium: 136 mmol/L (ref 135–145)
Sodium: 137 mmol/L (ref 135–145)
Sodium: 138 mmol/L (ref 135–145)
Sodium: 139 mmol/L (ref 135–145)
Sodium: 139 mmol/L (ref 135–145)
TCO2: 28 mmol/L (ref 22–32)
TCO2: 24 mmol/L (ref 22–32)
TCO2: 23 mmol/L (ref 22–32)
TCO2: 23 mmol/L (ref 22–32)
TCO2: 23 mmol/L (ref 22–32)
pCO2 arterial: 52.7 mmHg — ABNORMAL HIGH (ref 32.0–48.0)
pCO2 arterial: 49.1 mmHg — ABNORMAL HIGH (ref 32.0–48.0)
pCO2 arterial: 43.4 mmHg (ref 32.0–48.0)
pCO2 arterial: 39.1 mmHg (ref 32.0–48.0)
pCO2 arterial: 39.7 mmHg (ref 32.0–48.0)
pH, Arterial: 7.309 — ABNORMAL LOW (ref 7.350–7.450)
pH, Arterial: 7.278 — ABNORMAL LOW (ref 7.350–7.450)
pH, Arterial: 7.315 — ABNORMAL LOW (ref 7.350–7.450)
pH, Arterial: 7.36 (ref 7.350–7.450)
pH, Arterial: 7.343 — ABNORMAL LOW (ref 7.350–7.450)
pO2, Arterial: 416 mmHg — ABNORMAL HIGH (ref 83.0–108.0)
pO2, Arterial: 200 mmHg — ABNORMAL HIGH (ref 83.0–108.0)
pO2, Arterial: 96 mmHg (ref 83.0–108.0)
pO2, Arterial: 97 mmHg (ref 83.0–108.0)
pO2, Arterial: 84 mmHg (ref 83.0–108.0)

## 2018-10-01 LAB — POCT I-STAT, CHEM 8
BUN: 18 mg/dL (ref 8–23)
Calcium, Ion: 1.1 mmol/L — ABNORMAL LOW (ref 1.15–1.40)
Chloride: 103 mmol/L (ref 98–111)
Creatinine, Ser: 1.1 mg/dL (ref 0.61–1.24)
Glucose, Bld: 150 mg/dL — ABNORMAL HIGH (ref 70–99)
HCT: 22 % — ABNORMAL LOW (ref 39.0–52.0)
Hemoglobin: 7.5 g/dL — ABNORMAL LOW (ref 13.0–17.0)
Potassium: 4.3 mmol/L (ref 3.5–5.1)
Sodium: 138 mmol/L (ref 135–145)
TCO2: 22 mmol/L (ref 22–32)

## 2018-10-01 LAB — CBC
HCT: 34.5 % — ABNORMAL LOW (ref 39.0–52.0)
HCT: 26.6 % — ABNORMAL LOW (ref 39.0–52.0)
HCT: 23.9 % — ABNORMAL LOW (ref 39.0–52.0)
Hemoglobin: 11.3 g/dL — ABNORMAL LOW (ref 13.0–17.0)
Hemoglobin: 8.6 g/dL — ABNORMAL LOW (ref 13.0–17.0)
Hemoglobin: 7.9 g/dL — ABNORMAL LOW (ref 13.0–17.0)
MCH: 29.4 pg (ref 26.0–34.0)
MCH: 29.4 pg (ref 26.0–34.0)
MCH: 29.6 pg (ref 26.0–34.0)
MCHC: 32.8 g/dL (ref 30.0–36.0)
MCHC: 32.3 g/dL (ref 30.0–36.0)
MCHC: 33.1 g/dL (ref 30.0–36.0)
MCV: 89.8 fL (ref 80.0–100.0)
MCV: 90.8 fL (ref 80.0–100.0)
MCV: 89.5 fL (ref 80.0–100.0)
Platelets: 262 10*3/uL (ref 150–400)
Platelets: 236 10*3/uL (ref 150–400)
Platelets: 199 10*3/uL (ref 150–400)
RBC: 3.84 MIL/uL — ABNORMAL LOW (ref 4.22–5.81)
RBC: 2.93 MIL/uL — ABNORMAL LOW (ref 4.22–5.81)
RBC: 2.67 MIL/uL — ABNORMAL LOW (ref 4.22–5.81)
RDW: 14.1 % (ref 11.5–15.5)
RDW: 13.9 % (ref 11.5–15.5)
RDW: 14 % (ref 11.5–15.5)
WBC: 7.1 10*3/uL (ref 4.0–10.5)
WBC: 14.2 10*3/uL — ABNORMAL HIGH (ref 4.0–10.5)
WBC: 10.5 10*3/uL (ref 4.0–10.5)
nRBC: 0 % (ref 0.0–0.2)
nRBC: 0 % (ref 0.0–0.2)
nRBC: 0 % (ref 0.0–0.2)

## 2018-10-01 LAB — PROTIME-INR
INR: 1.4 — ABNORMAL HIGH (ref 0.8–1.2)
Prothrombin Time: 17 seconds — ABNORMAL HIGH (ref 11.4–15.2)

## 2018-10-01 LAB — BASIC METABOLIC PANEL
Anion gap: 9 (ref 5–15)
BUN: 17 mg/dL (ref 8–23)
CO2: 25 mmol/L (ref 22–32)
Calcium: 8.9 mg/dL (ref 8.9–10.3)
Chloride: 99 mmol/L (ref 98–111)
Creatinine, Ser: 1.27 mg/dL — ABNORMAL HIGH (ref 0.61–1.24)
GFR calc Af Amer: >60 mL/min (ref >60)
GFR calc non Af Amer: 55 mL/min — ABNORMAL LOW (ref >60)
Glucose, Bld: 315 mg/dL — ABNORMAL HIGH (ref 70–99)
Potassium: 4.8 mmol/L (ref 3.5–5.1)
Sodium: 133 mmol/L — ABNORMAL LOW (ref 135–145)

## 2018-10-01 LAB — PLATELET COUNT: Platelets: 250 10*3/uL (ref 150–400)

## 2018-10-01 LAB — HEMOGLOBIN AND HEMATOCRIT, BLOOD
HCT: 21.1 % — ABNORMAL LOW (ref 39.0–52.0)
Hemoglobin: 6.9 g/dL — CL (ref 13.0–17.0)

## 2018-10-01 LAB — GLUCOSE, CAPILLARY
Glucose-Capillary: 316 mg/dL — ABNORMAL HIGH (ref 70–99)
Glucose-Capillary: 178 mg/dL — ABNORMAL HIGH (ref 70–99)
Glucose-Capillary: 170 mg/dL — ABNORMAL HIGH (ref 70–99)
Glucose-Capillary: 162 mg/dL — ABNORMAL HIGH (ref 70–99)
Glucose-Capillary: 167 mg/dL — ABNORMAL HIGH (ref 70–99)
Glucose-Capillary: 156 mg/dL — ABNORMAL HIGH (ref 70–99)
Glucose-Capillary: 141 mg/dL — ABNORMAL HIGH (ref 70–99)
Glucose-Capillary: 143 mg/dL — ABNORMAL HIGH (ref 70–99)

## 2018-10-01 LAB — ECHO INTRAOPERATIVE TEE
Height: 72 in
Weight: 4659.64 oz

## 2018-10-01 LAB — CREATININE, SERUM
Creatinine, Ser: 1.11 mg/dL (ref 0.61–1.24)
GFR calc Af Amer: >60 mL/min (ref >60)
GFR calc non Af Amer: >60 mL/min (ref >60)

## 2018-10-01 LAB — HEPARIN LEVEL (UNFRACTIONATED): Heparin Unfractionated: 0.32 IU/mL (ref 0.30–0.70)

## 2018-10-01 LAB — MAGNESIUM: Magnesium: 2.3 mg/dL (ref 1.7–2.4)

## 2018-10-01 SURGERY — CORONARY ARTERY BYPASS GRAFTING (CABG)
Anesthesia: General | Site: Chest

## 2018-10-01 MED ORDER — METOPROLOL TARTRATE 5 MG/5ML IV SOLN
2.5000 mg | INTRAVENOUS | Status: DC | PRN
  Administered 2018-10-05: 5 mg via INTRAVENOUS
  Administered 2018-10-05: 2.5 mg via INTRAVENOUS
  Administered 2018-10-05: 5 mg via INTRAVENOUS
  Filled 2018-10-01 (×3): qty 5

## 2018-10-01 MED ORDER — TRANEXAMIC ACID (OHS) BOLUS VIA INFUSION
15.0000 mg/kg | INTRAVENOUS | Status: AC
  Administered 2018-10-01: 09:00:00 1981.5 mg via INTRAVENOUS
  Filled 2018-10-01: qty 1982

## 2018-10-01 MED ORDER — PHENYLEPHRINE HCL-NACL 20-0.9 MG/250ML-% IV SOLN
0.0000 ug/min | INTRAVENOUS | Status: DC
  Filled 2018-10-01: qty 250

## 2018-10-01 MED ORDER — FENTANYL CITRATE (PF) 250 MCG/5ML IJ SOLN
INTRAMUSCULAR | Status: AC
  Filled 2018-10-01: qty 25

## 2018-10-01 MED ORDER — SUCCINYLCHOLINE CHLORIDE 200 MG/10ML IV SOSY
PREFILLED_SYRINGE | INTRAVENOUS | Status: AC
  Filled 2018-10-01: qty 10

## 2018-10-01 MED ORDER — SODIUM CHLORIDE 0.9 % IV SOLN
750.0000 mg | INTRAVENOUS | Status: DC
  Filled 2018-10-01: qty 750

## 2018-10-01 MED ORDER — PHENYLEPHRINE 40 MCG/ML (10ML) SYRINGE FOR IV PUSH (FOR BLOOD PRESSURE SUPPORT)
PREFILLED_SYRINGE | INTRAVENOUS | Status: AC
  Filled 2018-10-01: qty 10

## 2018-10-01 MED ORDER — INSULIN REGULAR(HUMAN) IN NACL 100-0.9 UT/100ML-% IV SOLN
INTRAVENOUS | Status: DC
  Administered 2018-10-01: 10.2 [IU]/h via INTRAVENOUS
  Administered 2018-10-02: 8.4 [IU]/h via INTRAVENOUS
  Filled 2018-10-01 (×2): qty 100

## 2018-10-01 MED ORDER — FAMOTIDINE IN NACL 20-0.9 MG/50ML-% IV SOLN
20.0000 mg | Freq: Two times a day (BID) | INTRAVENOUS | Status: AC
  Administered 2018-10-01 (×2): 20 mg via INTRAVENOUS
  Filled 2018-10-01: qty 50

## 2018-10-01 MED ORDER — SODIUM CHLORIDE 0.9% FLUSH: 10.0000 mL | INTRAVENOUS | Status: DC | PRN

## 2018-10-01 MED ORDER — NITROGLYCERIN IN D5W 200-5 MCG/ML-% IV SOLN: 0.0000 ug/min | INTRAVENOUS | Status: DC

## 2018-10-01 MED ORDER — PANTOPRAZOLE SODIUM 40 MG PO TBEC
40.0000 mg | DELAYED_RELEASE_TABLET | Freq: Every day | ORAL | Status: DC
  Administered 2018-10-02 – 2018-10-19 (×18): 40 mg via ORAL
  Filled 2018-10-01 (×17): qty 1

## 2018-10-01 MED ORDER — MIDAZOLAM HCL 5 MG/5ML IJ SOLN
INTRAMUSCULAR | Status: DC | PRN
  Administered 2018-10-01: 1 mg via INTRAVENOUS
  Administered 2018-10-01 (×2): 2 mg via INTRAVENOUS
  Administered 2018-10-01: 3 mg via INTRAVENOUS
  Administered 2018-10-01: 2 mg via INTRAVENOUS

## 2018-10-01 MED ORDER — EPINEPHRINE PF 1 MG/ML IJ SOLN
0.0000 ug/min | INTRAVENOUS | Status: DC
  Filled 2018-10-01: qty 4

## 2018-10-01 MED ORDER — SODIUM CHLORIDE 0.9 % IV SOLN
1.5000 g | Freq: Two times a day (BID) | INTRAVENOUS | Status: AC
  Administered 2018-10-01 – 2018-10-03 (×4): 1.5 g via INTRAVENOUS
  Filled 2018-10-01 (×4): qty 1.5

## 2018-10-01 MED ORDER — LACTATED RINGERS IV SOLN
INTRAVENOUS | Status: DC | PRN
  Administered 2018-10-01 (×2): via INTRAVENOUS

## 2018-10-01 MED ORDER — DEXMEDETOMIDINE HCL IN NACL 200 MCG/50ML IV SOLN: 0.0000 ug/kg/h | INTRAVENOUS | Status: DC

## 2018-10-01 MED ORDER — ORAL CARE MOUTH RINSE
15.0000 mL | Freq: Two times a day (BID) | OROMUCOSAL | Status: DC
  Administered 2018-10-01 – 2018-10-19 (×30): 15 mL via OROMUCOSAL

## 2018-10-01 MED ORDER — POTASSIUM CHLORIDE 2 MEQ/ML IV SOLN
80.0000 meq | INTRAVENOUS | Status: DC
  Filled 2018-10-01: qty 40

## 2018-10-01 MED ORDER — SODIUM BICARBONATE 8.4 % IV SOLN
50.0000 meq | Freq: Once | INTRAVENOUS | Status: AC
  Administered 2018-10-01: 50 meq via INTRAVENOUS

## 2018-10-01 MED ORDER — PROTAMINE SULFATE 10 MG/ML IV SOLN
INTRAVENOUS | Status: DC | PRN
  Administered 2018-10-01: 450 mg via INTRAVENOUS

## 2018-10-01 MED ORDER — VANCOMYCIN HCL 10 G IV SOLR
1500.0000 mg | INTRAVENOUS | Status: AC
  Administered 2018-10-01: 08:00:00 1500 mg via INTRAVENOUS
  Filled 2018-10-01: qty 1500

## 2018-10-01 MED ORDER — TRANEXAMIC ACID (OHS) PUMP PRIME SOLUTION
2.0000 mg/kg | INTRAVENOUS | Status: DC
  Filled 2018-10-01: qty 2.64

## 2018-10-01 MED ORDER — ALBUMIN HUMAN 5 % IV SOLN
250.0000 mL | INTRAVENOUS | Status: DC | PRN
  Administered 2018-10-01 (×2): 12.5 g via INTRAVENOUS

## 2018-10-01 MED ORDER — MIDAZOLAM HCL 2 MG/2ML IJ SOLN: 2.0000 mg | INTRAMUSCULAR | Status: DC | PRN

## 2018-10-01 MED ORDER — MAGNESIUM SULFATE 50 % IJ SOLN
40.0000 meq | INTRAMUSCULAR | Status: DC
  Filled 2018-10-01: qty 9.85

## 2018-10-01 MED ORDER — ARTIFICIAL TEARS OPHTHALMIC OINT
TOPICAL_OINTMENT | OPHTHALMIC | Status: DC | PRN
  Administered 2018-10-01: 1 via OPHTHALMIC

## 2018-10-01 MED ORDER — SODIUM CHLORIDE 0.9% FLUSH
10.0000 mL | Freq: Two times a day (BID) | INTRAVENOUS | Status: DC
  Administered 2018-10-02 – 2018-10-06 (×9): 10 mL

## 2018-10-01 MED ORDER — TRANEXAMIC ACID 1000 MG/10ML IV SOLN
1.5000 mg/kg/h | INTRAVENOUS | Status: AC
  Administered 2018-10-01: 09:00:00 1.5 mg/kg/h via INTRAVENOUS
  Filled 2018-10-01: qty 25

## 2018-10-01 MED ORDER — PROTAMINE SULFATE 10 MG/ML IV SOLN
INTRAVENOUS | Status: AC
  Filled 2018-10-01: qty 50

## 2018-10-01 MED ORDER — SODIUM CHLORIDE 0.9 % IV SOLN
1.5000 g | INTRAVENOUS | Status: AC
  Administered 2018-10-01: 08:00:00 1.5 g via INTRAVENOUS
  Administered 2018-10-01: 13:00:00 .75 g via INTRAVENOUS
  Filled 2018-10-01: qty 1.5

## 2018-10-01 MED ORDER — NITROGLYCERIN IN D5W 200-5 MCG/ML-% IV SOLN
2.0000 ug/min | INTRAVENOUS | Status: AC
  Administered 2018-10-01: 25 ug/min via INTRAVENOUS
  Filled 2018-10-01: qty 250

## 2018-10-01 MED ORDER — ACETAMINOPHEN 160 MG/5ML PO SOLN: 1000.0000 mg | Freq: Four times a day (QID) | ORAL | Status: AC

## 2018-10-01 MED ORDER — SODIUM CHLORIDE 0.9 % IV SOLN: 250.0000 mL | INTRAVENOUS | Status: DC

## 2018-10-01 MED ORDER — ALBUMIN HUMAN 5 % IV SOLN
INTRAVENOUS | Status: DC | PRN
  Administered 2018-10-01 (×3): via INTRAVENOUS

## 2018-10-01 MED ORDER — MIDAZOLAM HCL (PF) 10 MG/2ML IJ SOLN
INTRAMUSCULAR | Status: AC
  Filled 2018-10-01: qty 2

## 2018-10-01 MED ORDER — FENTANYL CITRATE (PF) 250 MCG/5ML IJ SOLN
INTRAMUSCULAR | Status: DC | PRN
  Administered 2018-10-01: 50 ug via INTRAVENOUS
  Administered 2018-10-01: 200 ug via INTRAVENOUS
  Administered 2018-10-01: 250 ug via INTRAVENOUS
  Administered 2018-10-01: 200 ug via INTRAVENOUS
  Administered 2018-10-01: 150 ug via INTRAVENOUS
  Administered 2018-10-01: 100 ug via INTRAVENOUS
  Administered 2018-10-01: 50 ug via INTRAVENOUS
  Administered 2018-10-01: 100 ug via INTRAVENOUS
  Administered 2018-10-01: 150 ug via INTRAVENOUS

## 2018-10-01 MED ORDER — ROCURONIUM BROMIDE 10 MG/ML (PF) SYRINGE
PREFILLED_SYRINGE | INTRAVENOUS | Status: AC
  Filled 2018-10-01: qty 30

## 2018-10-01 MED ORDER — LACTATED RINGERS IV SOLN
INTRAVENOUS | Status: DC | PRN
  Administered 2018-10-01: 08:00:00 via INTRAVENOUS

## 2018-10-01 MED ORDER — ROSUVASTATIN CALCIUM 20 MG PO TABS
40.0000 mg | ORAL_TABLET | Freq: Every day | ORAL | Status: DC
  Administered 2018-10-02 – 2018-10-14 (×13): 40 mg via ORAL
  Filled 2018-10-01 (×13): qty 2

## 2018-10-01 MED ORDER — ONDANSETRON HCL 4 MG/2ML IJ SOLN
INTRAMUSCULAR | Status: AC
  Filled 2018-10-01: qty 2

## 2018-10-01 MED ORDER — SODIUM CHLORIDE 0.45 % IV SOLN
INTRAVENOUS | Status: DC | PRN
  Administered 2018-10-01: 15:00:00 via INTRAVENOUS

## 2018-10-01 MED ORDER — MILRINONE LACTATE IN DEXTROSE 20-5 MG/100ML-% IV SOLN
0.3000 ug/kg/min | INTRAVENOUS | Status: DC
  Filled 2018-10-01: qty 100

## 2018-10-01 MED ORDER — PROPOFOL 10 MG/ML IV BOLUS
INTRAVENOUS | Status: DC | PRN
  Administered 2018-10-01: 50 mg via INTRAVENOUS
  Administered 2018-10-01: 80 mg via INTRAVENOUS

## 2018-10-01 MED ORDER — SUCCINYLCHOLINE CHLORIDE 200 MG/10ML IV SOSY
PREFILLED_SYRINGE | INTRAVENOUS | Status: DC | PRN
  Administered 2018-10-01: 140 mg via INTRAVENOUS

## 2018-10-01 MED ORDER — OXYCODONE HCL 5 MG PO TABS
5.0000 mg | ORAL_TABLET | ORAL | Status: DC | PRN
  Administered 2018-10-01 – 2018-10-02 (×3): 10 mg via ORAL
  Administered 2018-10-03: 5 mg via ORAL
  Administered 2018-10-04 – 2018-10-09 (×18): 10 mg via ORAL
  Administered 2018-10-13 – 2018-10-17 (×6): 5 mg via ORAL
  Administered 2018-10-18 – 2018-10-19 (×3): 10 mg via ORAL
  Filled 2018-10-01 (×4): qty 2
  Filled 2018-10-01: qty 1
  Filled 2018-10-01: qty 2
  Filled 2018-10-01 (×2): qty 1
  Filled 2018-10-01 (×4): qty 2
  Filled 2018-10-01: qty 1
  Filled 2018-10-01 (×10): qty 2
  Filled 2018-10-01: qty 1
  Filled 2018-10-01: qty 2
  Filled 2018-10-01: qty 1
  Filled 2018-10-01 (×2): qty 2
  Filled 2018-10-01: qty 1
  Filled 2018-10-01 (×3): qty 2

## 2018-10-01 MED ORDER — LACTATED RINGERS IV SOLN
INTRAVENOUS | Status: DC
  Administered 2018-10-01: 20 mL/h via INTRAVENOUS

## 2018-10-01 MED ORDER — ASPIRIN EC 325 MG PO TBEC
325.0000 mg | DELAYED_RELEASE_TABLET | Freq: Every day | ORAL | Status: DC
  Administered 2018-10-02 – 2018-10-19 (×16): 325 mg via ORAL
  Filled 2018-10-01 (×18): qty 1

## 2018-10-01 MED ORDER — TRAMADOL HCL 50 MG PO TABS
50.0000 mg | ORAL_TABLET | ORAL | Status: DC | PRN
  Administered 2018-10-02 – 2018-10-12 (×13): 100 mg via ORAL
  Administered 2018-10-14: 50 mg via ORAL
  Administered 2018-10-15 – 2018-10-16 (×4): 100 mg via ORAL
  Filled 2018-10-01 (×4): qty 2
  Filled 2018-10-01: qty 1
  Filled 2018-10-01 (×11): qty 2
  Filled 2018-10-01: qty 1
  Filled 2018-10-01 (×2): qty 2

## 2018-10-01 MED ORDER — MORPHINE SULFATE (PF) 2 MG/ML IV SOLN
1.0000 mg | INTRAVENOUS | Status: DC | PRN
  Administered 2018-10-02 (×2): 2 mg via INTRAVENOUS
  Filled 2018-10-01: qty 1

## 2018-10-01 MED ORDER — INSULIN REGULAR(HUMAN) IN NACL 100-0.9 UT/100ML-% IV SOLN
INTRAVENOUS | Status: AC
  Administered 2018-10-01: 4.7 [IU]/h via INTRAVENOUS
  Filled 2018-10-01: qty 100

## 2018-10-01 MED ORDER — METOPROLOL TARTRATE 25 MG/10 ML ORAL SUSPENSION
12.5000 mg | Freq: Two times a day (BID) | ORAL | Status: DC
  Administered 2018-10-07: 12.5 mg
  Filled 2018-10-01 (×25): qty 5

## 2018-10-01 MED ORDER — HEPARIN SODIUM (PORCINE) 1000 UNIT/ML IJ SOLN
INTRAMUSCULAR | Status: AC
  Filled 2018-10-01: qty 2

## 2018-10-01 MED ORDER — POTASSIUM CHLORIDE 10 MEQ/50ML IV SOLN: 10.0000 meq | INTRAVENOUS | Status: AC

## 2018-10-01 MED ORDER — ONDANSETRON HCL 4 MG/2ML IJ SOLN
4.0000 mg | Freq: Four times a day (QID) | INTRAMUSCULAR | Status: DC | PRN
  Administered 2018-10-01 – 2018-10-13 (×5): 4 mg via INTRAVENOUS
  Filled 2018-10-01 (×5): qty 2

## 2018-10-01 MED ORDER — DOPAMINE-DEXTROSE 3.2-5 MG/ML-% IV SOLN
0.0000 ug/kg/min | INTRAVENOUS | Status: AC
  Administered 2018-10-01: 3 ug/kg/min via INTRAVENOUS
  Filled 2018-10-01: qty 250

## 2018-10-01 MED ORDER — PHENYLEPHRINE HCL-NACL 20-0.9 MG/250ML-% IV SOLN
30.0000 ug/min | INTRAVENOUS | Status: DC
  Filled 2018-10-01: qty 250

## 2018-10-01 MED ORDER — INSULIN REGULAR BOLUS VIA INFUSION
0.0000 [IU] | Freq: Three times a day (TID) | INTRAVENOUS | Status: DC
  Filled 2018-10-01: qty 10

## 2018-10-01 MED ORDER — ASPIRIN 81 MG PO CHEW
324.0000 mg | CHEWABLE_TABLET | Freq: Every day | ORAL | Status: DC
  Administered 2018-10-08 – 2018-10-18 (×2): 324 mg
  Filled 2018-10-01 (×6): qty 4

## 2018-10-01 MED ORDER — BISACODYL 10 MG RE SUPP
10.0000 mg | Freq: Every day | RECTAL | Status: DC
  Filled 2018-10-01: qty 1

## 2018-10-01 MED ORDER — PLASMA-LYTE 148 IV SOLN
INTRAVENOUS | Status: DC | PRN
  Administered 2018-10-01: 500 mL via INTRAVASCULAR

## 2018-10-01 MED ORDER — DEXMEDETOMIDINE HCL IN NACL 400 MCG/100ML IV SOLN
0.1000 ug/kg/h | INTRAVENOUS | Status: AC
  Administered 2018-10-01: .5 ug/kg/h via INTRAVENOUS
  Filled 2018-10-01: qty 100

## 2018-10-01 MED ORDER — SODIUM CHLORIDE 0.9 % IV SOLN
INTRAVENOUS | Status: DC
  Administered 2018-10-01: 15:00:00 via INTRAVENOUS

## 2018-10-01 MED ORDER — SODIUM CHLORIDE 0.9 % IV SOLN
INTRAVENOUS | Status: DC | PRN
  Administered 2018-10-01: 10:00:00 25 ug/min via INTRAVENOUS

## 2018-10-01 MED ORDER — ACETAMINOPHEN 650 MG RE SUPP
650.0000 mg | Freq: Once | RECTAL | Status: AC
  Administered 2018-10-01: 650 mg via RECTAL

## 2018-10-01 MED ORDER — SODIUM CHLORIDE 0.9 % IV SOLN
INTRAVENOUS | Status: DC
  Filled 2018-10-01: qty 30

## 2018-10-01 MED ORDER — SODIUM CHLORIDE 0.9% FLUSH: 3.0000 mL | INTRAVENOUS | Status: DC | PRN

## 2018-10-01 MED ORDER — CHLORHEXIDINE GLUCONATE 0.12 % MT SOLN
15.0000 mL | OROMUCOSAL | Status: AC
  Administered 2018-10-01: 15 mL via OROMUCOSAL

## 2018-10-01 MED ORDER — VANCOMYCIN HCL IN DEXTROSE 1-5 GM/200ML-% IV SOLN
1000.0000 mg | Freq: Once | INTRAVENOUS | Status: AC
  Administered 2018-10-01: 1000 mg via INTRAVENOUS
  Filled 2018-10-01: qty 200

## 2018-10-01 MED ORDER — METOPROLOL TARTRATE 12.5 MG HALF TABLET
12.5000 mg | ORAL_TABLET | Freq: Two times a day (BID) | ORAL | Status: DC
  Administered 2018-10-01 – 2018-10-19 (×35): 12.5 mg via ORAL
  Filled 2018-10-01 (×36): qty 1

## 2018-10-01 MED ORDER — PROPOFOL 10 MG/ML IV BOLUS
INTRAVENOUS | Status: AC
  Filled 2018-10-01: qty 20

## 2018-10-01 MED ORDER — SODIUM CHLORIDE (PF) 0.9 % IJ SOLN
OROMUCOSAL | Status: DC | PRN
  Administered 2018-10-01 (×3): via TOPICAL

## 2018-10-01 MED ORDER — SODIUM CHLORIDE 0.9 % IV SOLN
INTRAVENOUS | Status: DC | PRN
  Administered 2018-10-01: 13:00:00 40 ug/min via INTRAVENOUS

## 2018-10-01 MED ORDER — MAGNESIUM SULFATE 4 GM/100ML IV SOLN
4.0000 g | Freq: Once | INTRAVENOUS | Status: AC
  Administered 2018-10-01: 4 g via INTRAVENOUS
  Filled 2018-10-01: qty 100

## 2018-10-01 MED ORDER — DOCUSATE SODIUM 100 MG PO CAPS
200.0000 mg | ORAL_CAPSULE | Freq: Every day | ORAL | Status: DC
  Administered 2018-10-02 – 2018-10-16 (×13): 200 mg via ORAL
  Filled 2018-10-01 (×18): qty 2

## 2018-10-01 MED ORDER — LACTATED RINGERS IV SOLN
500.0000 mL | Freq: Once | INTRAVENOUS | Status: AC | PRN
  Administered 2018-10-01: 23:00:00 via INTRAVENOUS

## 2018-10-01 MED ORDER — ACETAMINOPHEN 160 MG/5ML PO SOLN: 650.0000 mg | Freq: Once | ORAL | Status: AC

## 2018-10-01 MED ORDER — CHLORHEXIDINE GLUCONATE CLOTH 2 % EX PADS
6.0000 | MEDICATED_PAD | Freq: Every day | CUTANEOUS | Status: DC
  Administered 2018-10-02 – 2018-10-07 (×6): 6 via TOPICAL

## 2018-10-01 MED ORDER — HEPARIN SODIUM (PORCINE) 1000 UNIT/ML IJ SOLN
INTRAMUSCULAR | Status: DC | PRN
  Administered 2018-10-01: 43000 [IU] via INTRAVENOUS
  Administered 2018-10-01: 2000 [IU] via INTRAVENOUS

## 2018-10-01 MED ORDER — LACTATED RINGERS IV SOLN: INTRAVENOUS | Status: DC

## 2018-10-01 MED ORDER — ROCURONIUM BROMIDE 10 MG/ML (PF) SYRINGE
PREFILLED_SYRINGE | INTRAVENOUS | Status: DC | PRN
  Administered 2018-10-01: 50 mg via INTRAVENOUS
  Administered 2018-10-01: 30 mg via INTRAVENOUS
  Administered 2018-10-01: 100 mg via INTRAVENOUS
  Administered 2018-10-01: 50 mg via INTRAVENOUS

## 2018-10-01 MED ORDER — 0.9 % SODIUM CHLORIDE (POUR BTL) OPTIME
TOPICAL | Status: DC | PRN
  Administered 2018-10-01: 5000 mL

## 2018-10-01 MED ORDER — HEMOSTATIC AGENTS (NO CHARGE) OPTIME
TOPICAL | Status: DC | PRN
  Administered 2018-10-01: 1 via TOPICAL

## 2018-10-01 MED ORDER — NOREPINEPHRINE 4 MG/250ML-% IV SOLN
0.0000 ug/min | INTRAVENOUS | Status: DC
  Filled 2018-10-01: qty 250

## 2018-10-01 MED ORDER — TRANEXAMIC ACID-NACL 1000-0.7 MG/100ML-% IV SOLN
1000.0000 mg | INTRAVENOUS | Status: DC
  Filled 2018-10-01: qty 100

## 2018-10-01 MED ORDER — SODIUM CHLORIDE 0.9 % IV SOLN
INTRAVENOUS | Status: DC | PRN
  Administered 2018-10-01: 13:00:00 via INTRAVENOUS

## 2018-10-01 MED ORDER — PHENYLEPHRINE 40 MCG/ML (10ML) SYRINGE FOR IV PUSH (FOR BLOOD PRESSURE SUPPORT)
PREFILLED_SYRINGE | INTRAVENOUS | Status: DC | PRN
  Administered 2018-10-01: 60 ug via INTRAVENOUS
  Administered 2018-10-01: 20 ug via INTRAVENOUS

## 2018-10-01 MED ORDER — ACETAMINOPHEN 500 MG PO TABS
1000.0000 mg | ORAL_TABLET | Freq: Four times a day (QID) | ORAL | Status: AC
  Administered 2018-10-02 – 2018-10-04 (×9): 1000 mg via ORAL
  Administered 2018-10-04: 500 mg via ORAL
  Administered 2018-10-04 – 2018-10-06 (×9): 1000 mg via ORAL
  Filled 2018-10-01 (×18): qty 2

## 2018-10-01 MED ORDER — SODIUM CHLORIDE 0.9% FLUSH
3.0000 mL | Freq: Two times a day (BID) | INTRAVENOUS | Status: DC
  Administered 2018-10-02 – 2018-10-07 (×10): 3 mL via INTRAVENOUS

## 2018-10-01 MED ORDER — PLASMA-LYTE 148 IV SOLN
INTRAVENOUS | Status: DC
  Filled 2018-10-01: qty 2.5

## 2018-10-01 MED ORDER — DOPAMINE-DEXTROSE 3.2-5 MG/ML-% IV SOLN
3.0000 ug/kg/min | INTRAVENOUS | Status: DC
  Administered 2018-10-03: 3 ug/kg/min via INTRAVENOUS
  Filled 2018-10-01: qty 250

## 2018-10-01 MED ORDER — BISACODYL 5 MG PO TBEC
10.0000 mg | DELAYED_RELEASE_TABLET | Freq: Every day | ORAL | Status: DC
  Administered 2018-10-02 – 2018-10-13 (×11): 10 mg via ORAL
  Filled 2018-10-01 (×16): qty 2

## 2018-10-01 SURGICAL SUPPLY — 91 items
BAG DECANTER FOR FLEXI CONT (MISCELLANEOUS) ×3 IMPLANT
BANDAGE ACE 4X5 VEL STRL LF (GAUZE/BANDAGES/DRESSINGS) ×3 IMPLANT
BANDAGE ACE 6X5 VEL STRL LF (GAUZE/BANDAGES/DRESSINGS) ×3 IMPLANT
BANDAGE ELASTIC 4 VELCRO ST LF (GAUZE/BANDAGES/DRESSINGS) ×1 IMPLANT
BANDAGE ELASTIC 6 VELCRO ST LF (GAUZE/BANDAGES/DRESSINGS) ×1 IMPLANT
BASKET HEART (ORDER IN 25'S) (MISCELLANEOUS) ×1
BASKET HEART (ORDER IN 25S) (MISCELLANEOUS) ×2 IMPLANT
BLADE STERNUM SYSTEM 6 (BLADE) ×3 IMPLANT
BNDG GAUZE ELAST 4 BULKY (GAUZE/BANDAGES/DRESSINGS) ×3 IMPLANT
CANISTER SUCT 3000ML PPV (MISCELLANEOUS) ×3 IMPLANT
CANNULA EZ GLIDE 8.0 24FR (CANNULA) ×1 IMPLANT
CANNULA EZ GLIDE AORTIC 21FR (CANNULA) ×3 IMPLANT
CATH CPB KIT HENDRICKSON (MISCELLANEOUS) ×3 IMPLANT
CATH ROBINSON RED A/P 18FR (CATHETERS) ×3 IMPLANT
CATH THORACIC 36FR (CATHETERS) ×3 IMPLANT
CATH THORACIC 36FR RT ANG (CATHETERS) ×3 IMPLANT
CLIP FOGARTY SPRING 6M (CLIP) ×1 IMPLANT
CLIP VESOCCLUDE MED 24/CT (CLIP) IMPLANT
CLIP VESOCCLUDE SM WIDE 24/CT (CLIP) ×2 IMPLANT
CONT SPEC 4OZ CLIKSEAL STRL BL (MISCELLANEOUS) ×1 IMPLANT
COVER WAND RF STERILE (DRAPES) ×2 IMPLANT
CRADLE DONUT ADULT HEAD (MISCELLANEOUS) ×3 IMPLANT
DRAPE CARDIOVASCULAR INCISE (DRAPES) ×3
DRAPE SLUSH/WARMER DISC (DRAPES) ×3 IMPLANT
DRAPE SRG 135X102X78XABS (DRAPES) ×2 IMPLANT
DRSG COVADERM 4X14 (GAUZE/BANDAGES/DRESSINGS) ×3 IMPLANT
ELECT REM PT RETURN 9FT ADLT (ELECTROSURGICAL) ×6
ELECTRODE REM PT RTRN 9FT ADLT (ELECTROSURGICAL) ×4 IMPLANT
FELT TEFLON 1X6 (MISCELLANEOUS) ×5 IMPLANT
GAUZE SPONGE 4X4 12PLY STRL (GAUZE/BANDAGES/DRESSINGS) ×6 IMPLANT
GAUZE SPONGE 4X4 12PLY STRL LF (GAUZE/BANDAGES/DRESSINGS) ×2 IMPLANT
GLOVE BIO SURGEON STRL SZ7.5 (GLOVE) ×2 IMPLANT
GLOVE BIOGEL PI IND STRL 6 (GLOVE) IMPLANT
GLOVE BIOGEL PI IND STRL 6.5 (GLOVE) IMPLANT
GLOVE BIOGEL PI INDICATOR 6 (GLOVE) ×6
GLOVE BIOGEL PI INDICATOR 6.5 (GLOVE) ×1
GLOVE SURG SIGNA 7.5 PF LTX (GLOVE) ×9 IMPLANT
GOWN STRL REUS W/ TWL LRG LVL3 (GOWN DISPOSABLE) ×8 IMPLANT
GOWN STRL REUS W/ TWL XL LVL3 (GOWN DISPOSABLE) ×4 IMPLANT
GOWN STRL REUS W/TWL LRG LVL3 (GOWN DISPOSABLE) ×18
GOWN STRL REUS W/TWL XL LVL3 (GOWN DISPOSABLE) ×6
HEMOSTAT POWDER SURGIFOAM 1G (HEMOSTASIS) ×9 IMPLANT
HEMOSTAT SURGICEL 2X14 (HEMOSTASIS) ×3 IMPLANT
INSERT FOGARTY XLG (MISCELLANEOUS) IMPLANT
KIT BASIN OR (CUSTOM PROCEDURE TRAY) ×3 IMPLANT
KIT SUCTION CATH 14FR (SUCTIONS) ×6 IMPLANT
KIT TURNOVER KIT B (KITS) ×3 IMPLANT
KIT VASOVIEW HEMOPRO 2 VH 4000 (KITS) ×3 IMPLANT
MARKER GRAFT CORONARY BYPASS (MISCELLANEOUS) ×9 IMPLANT
NS IRRIG 1000ML POUR BTL (IV SOLUTION) ×15 IMPLANT
PACK E OPEN HEART (SUTURE) ×3 IMPLANT
PACK OPEN HEART (CUSTOM PROCEDURE TRAY) ×3 IMPLANT
PAD ARMBOARD 7.5X6 YLW CONV (MISCELLANEOUS) ×6 IMPLANT
PAD ELECT DEFIB RADIOL ZOLL (MISCELLANEOUS) ×3 IMPLANT
PENCIL BUTTON HOLSTER BLD 10FT (ELECTRODE) ×3 IMPLANT
PUNCH AORTIC ROTATE 4.0MM (MISCELLANEOUS) IMPLANT
PUNCH AORTIC ROTATE 4.5MM 8IN (MISCELLANEOUS) ×1 IMPLANT
PUNCH AORTIC ROTATE 5MM 8IN (MISCELLANEOUS) IMPLANT
SEALANT PROGEL (MISCELLANEOUS) ×1 IMPLANT
SPONGE LAP 4X18 RFD (DISPOSABLE) ×1 IMPLANT
SUT BONE WAX W31G (SUTURE) ×3 IMPLANT
SUT MNCRL AB 4-0 PS2 18 (SUTURE) IMPLANT
SUT PROLENE 3 0 SH DA (SUTURE) ×3 IMPLANT
SUT PROLENE 4 0 RB 1 (SUTURE) ×12
SUT PROLENE 4 0 SH DA (SUTURE) IMPLANT
SUT PROLENE 4-0 RB1 .5 CRCL 36 (SUTURE) IMPLANT
SUT PROLENE 6 0 C 1 30 (SUTURE) ×7 IMPLANT
SUT PROLENE 7 0 BV 1 (SUTURE) ×6 IMPLANT
SUT PROLENE 7 0 BV1 MDA (SUTURE) ×4 IMPLANT
SUT PROLENE 8 0 BV175 6 (SUTURE) IMPLANT
SUT STEEL 6MS V (SUTURE) ×3 IMPLANT
SUT STEEL STERNAL CCS#1 18IN (SUTURE) IMPLANT
SUT STEEL SZ 6 DBL 3X14 BALL (SUTURE) ×3 IMPLANT
SUT VIC AB 1 CTX 36 (SUTURE) ×6
SUT VIC AB 1 CTX36XBRD ANBCTR (SUTURE) ×4 IMPLANT
SUT VIC AB 2-0 CT1 27 (SUTURE) ×3
SUT VIC AB 2-0 CT1 TAPERPNT 27 (SUTURE) IMPLANT
SUT VIC AB 2-0 CTX 27 (SUTURE) IMPLANT
SUT VIC AB 3-0 SH 27 (SUTURE)
SUT VIC AB 3-0 SH 27X BRD (SUTURE) IMPLANT
SUT VIC AB 3-0 X1 27 (SUTURE) ×1 IMPLANT
SUT VICRYL 4-0 PS2 18IN ABS (SUTURE) IMPLANT
SYSTEM SAHARA CHEST DRAIN ATS (WOUND CARE) ×3 IMPLANT
TAPE CLOTH SURG 4X10 WHT LF (GAUZE/BANDAGES/DRESSINGS) ×2 IMPLANT
TAPE PAPER 2X10 WHT MICROPORE (GAUZE/BANDAGES/DRESSINGS) ×1 IMPLANT
TOWEL GREEN STERILE (TOWEL DISPOSABLE) ×3 IMPLANT
TOWEL GREEN STERILE FF (TOWEL DISPOSABLE) ×3 IMPLANT
TRAY FOLEY SLVR 16FR TEMP STAT (SET/KITS/TRAYS/PACK) ×3 IMPLANT
TUBING LAP HI FLOW INSUFFLATIO (TUBING) ×3 IMPLANT
UNDERPAD 30X30 (UNDERPADS AND DIAPERS) ×3 IMPLANT
WATER STERILE IRR 1000ML POUR (IV SOLUTION) ×6 IMPLANT

## 2018-10-01 NOTE — Transfer of Care (Signed)
Immediate Anesthesia Transfer of Care Note  Patient: Martin Sr.  Procedure(s) Performed: CORONARY ARTERY BYPASS GRAFTING (CABG) TIMES  FOUR USING LEFT MAMMARY ARTERY AND RIGHT GREATER SAPHEANOUS VEIN HARVESTED ENDOSCOPICALLY (N/A Chest) TRANSESOPHAGEAL ECHOCARDIOGRAM (TEE) (N/A )  Patient Location: PACU  Anesthesia Type:General  Level of Consciousness: sedated and Patient remains intubated per anesthesia plan  Airway & Oxygen Therapy: Patient remains intubated per anesthesia plan and Patient placed on Ventilator (see vital sign flow sheet for setting)  Post-op Assessment: Report given to RN and Post -op Vital signs reviewed and stable  Post vital signs: Reviewed and stable  Last Vitals:  Vitals Value Taken Time  BP    Temp    Pulse 99 10/01/2018  2:35 PM  Resp 10 10/01/2018  2:35 PM  SpO2 100 % 10/01/2018  2:35 PM  Vitals shown include unvalidated device data.  Last Pain:  Vitals:   10/01/18 0410  TempSrc: Oral  PainSc:          Complications: No apparent anesthesia complications

## 2018-10-01 NOTE — Brief Op Note (Signed)
09/29/2018 - 10/01/2018  2:31 PM  PATIENT:  Martin Agee Sr.  73 y.o. male  PRE-OPERATIVE DIAGNOSIS:  CAD  POST-OPERATIVE DIAGNOSIS:  CAD  PROCEDURE:  Procedure(s): CORONARY ARTERY BYPASS GRAFTING (CABG) TIMES  FOUR USING LEFT MAMMARY ARTERY AND RIGHT GREATER SAPHEANOUS VEIN HARVESTED ENDOSCOPICALLY (N/A) TRANSESOPHAGEAL ECHOCARDIOGRAM (TEE) (N/A) FREE LIMA-LAD SVG-DIAG SVG-PD SVG-PL  SURGEON:  Surgeon(s) and Role:    * Melrose Nakayama, MD - Primary  PHYSICIAN ASSISTANT:   ANESTHESIA:   general  EBL:  850 mL   BLOOD ADMINISTERED:none  DRAINS: routine pleural and pericardial chest drains   LOCAL MEDICATIONS USED:  NONE  SPECIMEN:  No Specimen  DISPOSITION OF SPECIMEN:  N/A  COUNTS:  YES  TOURNIQUET:  * No tourniquets in log *  DICTATION: .Other Dictation: Dictation Number pending  PLAN OF CARE: Admit to inpatient   PATIENT DISPOSITION:  ICU - intubated and hemodynamically stable.   Delay start of Pharmacological VTE agent (>24hrs) due to surgical blood loss or risk of bleeding: yes  Complicatons: no known

## 2018-10-01 NOTE — Progress Notes (Signed)
Patient ID: Martin Agee Sr., male   DOB: Sep 09, 1945, 73 y.o.   MRN: [id number] EVENING ROUNDS NOTE :     [address]       [place] [address]             [phone]                 Day of Surgery Procedure(s) (LRB): CORONARY ARTERY BYPASS GRAFTING (CABG) TIMES  FOUR USING LEFT MAMMARY ARTERY AND RIGHT GREATER SAPHEANOUS VEIN HARVESTED ENDOSCOPICALLY (N/A) TRANSESOPHAGEAL ECHOCARDIOGRAM (TEE) (N/A)  Total Length of Stay:  LOS: 2 days  BP (!) 115/55   Pulse 99   Temp 98.2 F (36.8 C) (Oral)   Resp 16   Ht 6' (1.829 m)   Wt 132.1 kg   SpO2 99%   BMI 39.50 kg/m   .Intake/Output      06/03 0701 - 06/04 0700 06/04 0701 - 06/05 0700   P.O. 1200    I.V. (mL/kg) 999.5 (7.6) 3100 (23.5)   Blood  339   IV Piggyback  750   Total Intake(mL/kg) 2199.5 (16.7) 4189 (31.7)   Urine (mL/kg/hr) 2025 (0.6) 610 (0.4)   Stool 0    Blood  850   Total Output 2025 1460   Net +174.5 +2729        Urine Occurrence 1 x    Stool Occurrence 1 x      . sodium chloride 20 mL/hr at 10/01/18 1447  . [START ON 10/02/2018] sodium chloride    . sodium chloride 20 mL/hr at 10/01/18 1519  . albumin human 12.5 g (10/01/18 1457)  . cefUROXime (ZINACEF)  IV    . dexmedetomidine (PRECEDEX) IV infusion 0.7 mcg/kg/hr (10/01/18 1430)  . DOPamine 2.947 mcg/kg/min (10/01/18 1430)  . famotidine (PEPCID) IV 20 mg (10/01/18 1448)  . insulin 9.4 Units/hr (10/01/18 1430)  . lactated ringers    . lactated ringers 20 mL/hr at 10/01/18 1458  . lactated ringers 20 mL/hr at 10/01/18 1430  . magnesium sulfate 4 g (10/01/18 1549)  . nitroGLYCERIN    . phenylephrine (NEO-SYNEPHRINE) Adult infusion 20 mcg/min (10/01/18 1430)  . vancomycin       Lab Results  Component Value Date   WBC 14.2 (H) 10/01/2018   HGB 8.6 (L) 10/01/2018   HCT 26.6 (L) 10/01/2018   PLT 236 10/01/2018   GLUCOSE 217 (H) 10/01/2018   CHOL 205 (H) 09/29/2018   TRIG 195 (H) 09/29/2018   HDL 27 (L) 09/29/2018   LDLDIRECT  173.0 07/01/2018   LDLCALC 139 (H) 09/29/2018   ALT 22 09/29/2018   AST 32 09/29/2018   NA 137 10/01/2018   K 4.5 10/01/2018   CL 99 10/01/2018   CREATININE 1.27 (H) 10/01/2018   BUN 17 10/01/2018   CO2 25 10/01/2018   TSH 4.13 08/08/2015   PSA 0.38 08/08/2015   INR 1.4 (H) 10/01/2018   HGBA1C 8.1 (H) 09/29/2018   MICROALBUR 41.6 (H) 08/08/2015   Early postop On vent sedated  Starting to wean vent Low chest tube output  total 150 ml ct drainage    Grace Isaac MD  Beeper [phone] Office [phone] 10/01/2018 5:46 PM

## 2018-10-01 NOTE — Anesthesia Procedure Notes (Signed)
Central Venous Catheter Insertion Performed by: Audry Pili, MD, anesthesiologist Start/End6/07/2018 7:46 AM, 10/01/2018 7:50 AM Patient location: Pre-op. Preanesthetic checklist: patient identified, IV checked, risks and benefits discussed, surgical consent, monitors and equipment checked, pre-op evaluation, timeout performed and anesthesia consent Position: Trendelenburg Hand hygiene performed  and maximum sterile barriers used  Total catheter length 10. PA cath was placed.Swan type:thermodilution PA Cath depth:55 Procedure performed without using ultrasound guided technique. Attempts: 1 Patient tolerated the procedure well with no immediate complications.

## 2018-10-01 NOTE — Procedures (Signed)
Extubation Procedure Note  Patient Details:   Name: Martin LOVEALL Sr. DOB: November 05, 1945 MRN: [id number]   Airway Documentation:    Vent end date: 10/01/18 Vent end time: 1820   Evaluation  O2 sats: stable throughout Complications: No apparent complications Patient did tolerate procedure well. Bilateral Breath Sounds: Rhonchi   Yes   Patient extubated to 4L nasal cannula per protocol.  Positive cuff leak noted.  NIF of -25, VC of 800L.  Patient able to speak post extubation.  IS performed with RN with achieved goal of 725.  No evidence of stridor.  No complications noted.   Judith Part 10/01/2018, 7:28 PM

## 2018-10-01 NOTE — Progress Notes (Signed)
RT note: rapid wean protocol initiated at 1720.  Will continue to monitor.

## 2018-10-01 NOTE — Anesthesia Procedure Notes (Signed)
Central Venous Catheter Insertion Performed by: Audry Pili, MD, anesthesiologist Start/End6/07/2018 7:09 AM, 10/01/2018 7:31 AM Patient location: Pre-op. Preanesthetic checklist: patient identified, IV checked, risks and benefits discussed, surgical consent, monitors and equipment checked, pre-op evaluation, timeout performed and anesthesia consent Position: Trendelenburg Lidocaine 1% used for infiltration and patient sedated Hand hygiene performed , maximum sterile barriers used  and Seldinger technique used Catheter size: 8.5 Fr Central line was placed.MAC introducer Procedure performed using ultrasound guided technique. Ultrasound Notes:anatomy identified, needle tip was noted to be adjacent to the nerve/plexus identified, no ultrasound evidence of intravascular and/or intraneural injection and image(s) printed for medical record Attempts: 2 Patient tolerated the procedure well with no immediate complications. Additional procedure comments:  Relevant anatomy identified with ultrasound. Catheter placed in right IJ. Transduced to confirm venous placement. Wire passed through catheter easily. Merrilee Seashore made with scalpel, dilator and MAC line placed with little resistance. Upon removing dilator and guidewire, wire noted to be bent in multiple angles. Attempt made to draw back blood on both ports of MAC line to confirm intravascular placement, no blood withdrawn from either port. Line removed. Pressure held for 5 minutes. No significant hematoma developed. Suspect dilator and line only entered subcutaneous tissue and wire pulled out of IJ during placement. Second attempt at right IJ line placement abandoned early in the attempt due to difficult visualization on ultrasound given previous attempt. Decision made to take down drapes and prep for left-sided CVL. Picture of right IJ attempt included to demonstrate right IJ location of guidewire.Marland Kitchen

## 2018-10-01 NOTE — Anesthesia Procedure Notes (Signed)
Procedure Name: Intubation Date/Time: 10/01/2018 8:21 AM Performed by: Imagene Riches, CRNA Pre-anesthesia Checklist: Patient identified, Emergency Drugs available, Suction available and Patient being monitored Patient Re-evaluated:Patient Re-evaluated prior to induction Oxygen Delivery Method: Circle System Utilized Preoxygenation: Pre-oxygenation with 100% oxygen Induction Type: IV induction Ventilation: Two handed mask ventilation required and Oral airway inserted - appropriate to patient size Laryngoscope Size: Glidescope and 3 Grade View: Grade I Tube type: Oral Tube size: 8.0 mm Number of attempts: 3 Airway Equipment and Method: Stylet and Oral airway Placement Confirmation: ETT inserted through vocal cords under direct vision,  positive ETCO2 and breath sounds checked- equal and bilateral Secured at: 25 cm Tube secured with: Tape Dental Injury: Teeth and Oropharynx as per pre-operative assessment  Difficulty Due To: Difficulty was anticipated, Difficult Airway- due to reduced neck mobility, Difficult Airway- due to anterior larynx and Difficult Airway- due to limited oral opening Future Recommendations: Recommend- induction with short-acting agent, and alternative techniques readily available Comments: First Dl with Miller 3, grade 3 view, no attempt to pass ETT. Second DL with glide scope blade 4, unable to maneuver ETT into trachea. Patient mask ventilated, two hands, oral airway. Third DL with glide scope blade 3, successful placement of ETT. BBS, + ETCO2.

## 2018-10-01 NOTE — Anesthesia Procedure Notes (Signed)
Central Venous Catheter Insertion Performed by: Audry Pili, MD, anesthesiologist Start/End6/07/2018 7:36 AM, 10/01/2018 7:47 AM Patient location: Pre-op. Preanesthetic checklist: patient identified, IV checked, risks and benefits discussed, surgical consent, monitors and equipment checked, pre-op evaluation, timeout performed and anesthesia consent Position: Trendelenburg Lidocaine 1% used for infiltration and patient sedated Hand hygiene performed , maximum sterile barriers used  and Seldinger technique used Catheter size: 8.5 Fr Central line was placed.MAC introducer Procedure performed using ultrasound guided technique. Ultrasound Notes:anatomy identified, needle tip was noted to be adjacent to the nerve/plexus identified, no ultrasound evidence of intravascular and/or intraneural injection and image(s) printed for medical record Attempts: 1 Following insertion, line sutured, dressing applied and Biopatch. Post procedure assessment: blood return through all ports, no air and free fluid flow  Patient tolerated the procedure well with no immediate complications. Additional procedure comments: No difficulty encountered with left IJ CVL after failed placement on right side (see other procedure note for details).Marland Kitchen

## 2018-10-01 NOTE — Progress Notes (Signed)
At 2250, with assistance of 2 other staff nurses, patient was assisted to side of bed to dangle.  Became diaphoretic, pale, nauseated. Assisted back up in bed, repositioned. Patient complained of feeling like he was going to throw up, vagals, dropping BP in the 70's via Aline.  Patient quickly recovered.  Patient had similar episodes with coughing if he strained.  Pressures remain labile, titrating neo gtt, given 500cc LR bolus.  Monitoring.

## 2018-10-01 NOTE — Anesthesia Procedure Notes (Addendum)
Arterial Line Insertion Start/End6/07/2018 7:10 AM, 10/01/2018 7:30 AM Performed by: Lavell Luster, CRNA, CRNA  Patient location: Pre-op. Preanesthetic checklist: patient identified, IV checked, site marked, risks and benefits discussed, surgical consent, monitors and equipment checked, pre-op evaluation, timeout performed and anesthesia consent Patient sedated Left, radial was placed Catheter size: 20 G Hand hygiene performed  and maximum sterile barriers used   Attempts: 3 (two providers attempted) Procedure performed without using ultrasound guided technique. Following insertion, dressing applied and Biopatch. Post procedure assessment: normal  Patient tolerated the procedure well with no immediate complications.

## 2018-10-02 ENCOUNTER — Encounter (HOSPITAL_COMMUNITY): Payer: Self-pay | Admitting: Thoracic Surgery (Cardiothoracic Vascular Surgery)

## 2018-10-02 ENCOUNTER — Inpatient Hospital Stay (HOSPITAL_COMMUNITY): Payer: PPO

## 2018-10-02 LAB — CBC
HCT: 24.4 % — ABNORMAL LOW (ref 39.0–52.0)
HCT: 22.5 % — ABNORMAL LOW (ref 39.0–52.0)
Hemoglobin: 8.1 g/dL — ABNORMAL LOW (ref 13.0–17.0)
Hemoglobin: 7.4 g/dL — ABNORMAL LOW (ref 13.0–17.0)
MCH: 29.9 pg (ref 26.0–34.0)
MCH: 30.5 pg (ref 26.0–34.0)
MCHC: 33.2 g/dL (ref 30.0–36.0)
MCHC: 32.9 g/dL (ref 30.0–36.0)
MCV: 90 fL (ref 80.0–100.0)
MCV: 92.6 fL (ref 80.0–100.0)
Platelets: 258 K/uL (ref 150–400)
Platelets: 197 10*3/uL (ref 150–400)
RBC: 2.71 MIL/uL — ABNORMAL LOW (ref 4.22–5.81)
RBC: 2.43 MIL/uL — ABNORMAL LOW (ref 4.22–5.81)
RDW: 14.4 % (ref 11.5–15.5)
RDW: 15 % (ref 11.5–15.5)
WBC: 14.8 K/uL — ABNORMAL HIGH (ref 4.0–10.5)
WBC: 13.9 10*3/uL — ABNORMAL HIGH (ref 4.0–10.5)
nRBC: 0 % (ref 0.0–0.2)
nRBC: 0 % (ref 0.0–0.2)

## 2018-10-02 LAB — GLUCOSE, CAPILLARY
Glucose-Capillary: 101 mg/dL — ABNORMAL HIGH (ref 70–99)
Glucose-Capillary: 101 mg/dL — ABNORMAL HIGH (ref 70–99)
Glucose-Capillary: 103 mg/dL — ABNORMAL HIGH (ref 70–99)
Glucose-Capillary: 107 mg/dL — ABNORMAL HIGH (ref 70–99)
Glucose-Capillary: 107 mg/dL — ABNORMAL HIGH (ref 70–99)
Glucose-Capillary: 110 mg/dL — ABNORMAL HIGH (ref 70–99)
Glucose-Capillary: 112 mg/dL — ABNORMAL HIGH (ref 70–99)
Glucose-Capillary: 116 mg/dL — ABNORMAL HIGH (ref 70–99)
Glucose-Capillary: 116 mg/dL — ABNORMAL HIGH (ref 70–99)
Glucose-Capillary: 116 mg/dL — ABNORMAL HIGH (ref 70–99)
Glucose-Capillary: 121 mg/dL — ABNORMAL HIGH (ref 70–99)
Glucose-Capillary: 134 mg/dL — ABNORMAL HIGH (ref 70–99)
Glucose-Capillary: 148 mg/dL — ABNORMAL HIGH (ref 70–99)
Glucose-Capillary: 199 mg/dL — ABNORMAL HIGH (ref 70–99)
Glucose-Capillary: 96 mg/dL (ref 70–99)
Glucose-Capillary: 98 mg/dL (ref 70–99)

## 2018-10-02 LAB — BASIC METABOLIC PANEL WITH GFR
Anion gap: 8 (ref 5–15)
BUN: 18 mg/dL (ref 8–23)
CO2: 22 mmol/L (ref 22–32)
Calcium: 7.7 mg/dL — ABNORMAL LOW (ref 8.9–10.3)
Chloride: 106 mmol/L (ref 98–111)
Creatinine, Ser: 1.23 mg/dL (ref 0.61–1.24)
GFR calc Af Amer: >60 mL/min
GFR calc non Af Amer: 57 mL/min — ABNORMAL LOW
Glucose, Bld: 118 mg/dL — ABNORMAL HIGH (ref 70–99)
Potassium: 4.1 mmol/L (ref 3.5–5.1)
Sodium: 136 mmol/L (ref 135–145)

## 2018-10-02 LAB — BASIC METABOLIC PANEL
Anion gap: 12 (ref 5–15)
BUN: 22 mg/dL (ref 8–23)
CO2: 20 mmol/L — ABNORMAL LOW (ref 22–32)
Calcium: 7.7 mg/dL — ABNORMAL LOW (ref 8.9–10.3)
Chloride: 104 mmol/L (ref 98–111)
Creatinine, Ser: 1.54 mg/dL — ABNORMAL HIGH (ref 0.61–1.24)
GFR calc Af Amer: 51 mL/min — ABNORMAL LOW (ref >60)
GFR calc non Af Amer: 44 mL/min — ABNORMAL LOW (ref >60)
Glucose, Bld: 188 mg/dL — ABNORMAL HIGH (ref 70–99)
Potassium: 4.6 mmol/L (ref 3.5–5.1)
Sodium: 136 mmol/L (ref 135–145)

## 2018-10-02 LAB — MAGNESIUM
Magnesium: 2.5 mg/dL — ABNORMAL HIGH (ref 1.7–2.4)
Magnesium: 2.5 mg/dL — ABNORMAL HIGH (ref 1.7–2.4)

## 2018-10-02 MED ORDER — ALBUMIN HUMAN 5 % IV SOLN
12.5000 g | Freq: Once | INTRAVENOUS | Status: AC
  Administered 2018-10-02: 12.5 g via INTRAVENOUS
  Filled 2018-10-02: qty 250

## 2018-10-02 MED ORDER — ENOXAPARIN SODIUM 40 MG/0.4ML ~~LOC~~ SOLN
40.0000 mg | Freq: Every day | SUBCUTANEOUS | Status: DC
  Administered 2018-10-02 – 2018-10-18 (×17): 40 mg via SUBCUTANEOUS
  Filled 2018-10-02 (×17): qty 0.4

## 2018-10-02 MED ORDER — INSULIN ASPART 100 UNIT/ML ~~LOC~~ SOLN
0.0000 [IU] | SUBCUTANEOUS | Status: DC
  Administered 2018-10-02: 4 [IU] via SUBCUTANEOUS
  Administered 2018-10-02: 2 [IU] via SUBCUTANEOUS
  Administered 2018-10-03: 8 [IU] via SUBCUTANEOUS
  Administered 2018-10-03: 12 [IU] via SUBCUTANEOUS
  Administered 2018-10-03: 8 [IU] via SUBCUTANEOUS

## 2018-10-02 MED ORDER — INSULIN DETEMIR 100 UNIT/ML ~~LOC~~ SOLN
60.0000 [IU] | Freq: Two times a day (BID) | SUBCUTANEOUS | Status: DC
  Administered 2018-10-02 (×2): 60 [IU] via SUBCUTANEOUS
  Filled 2018-10-02 (×4): qty 0.6

## 2018-10-02 MED ORDER — FUROSEMIDE 10 MG/ML IJ SOLN
40.0000 mg | Freq: Once | INTRAMUSCULAR | Status: AC
  Administered 2018-10-02: 40 mg via INTRAVENOUS
  Filled 2018-10-02: qty 4

## 2018-10-02 NOTE — Progress Notes (Signed)
      [address]       [place] [address]             [phone]      POD # 1  Sleeping BP (!) 136/52   Pulse 84   Temp 98.8 F (37.1 C) (Oral)   Resp 18   Ht 6' (1.829 m)   Wt 132.1 kg   SpO2 97%   BMI 39.50 kg/m  4L [place] 97% sat CBG well controlled Hgb 22  Doing well POD # 1  Steven C. Roxan Hockey, MD [facility] [phone]

## 2018-10-02 NOTE — Anesthesia Postprocedure Evaluation (Signed)
Anesthesia Post Note  Patient: [place] Sr.  Procedure(s) Performed: CORONARY ARTERY BYPASS GRAFTING (CABG) TIMES  FOUR USING LEFT MAMMARY ARTERY AND RIGHT GREATER SAPHEANOUS VEIN HARVESTED ENDOSCOPICALLY (N/A Chest) TRANSESOPHAGEAL ECHOCARDIOGRAM (TEE) (N/A )     Patient location during evaluation: ICU Anesthesia Type: General Level of consciousness: awake and alert Pain management: pain level controlled Vital Signs Assessment: post-procedure vital signs reviewed and stable Respiratory status: spontaneous breathing, nonlabored ventilation and respiratory function stable Cardiovascular status: blood pressure returned to baseline and stable Postop Assessment: no apparent nausea or vomiting Anesthetic complications: no Comments: Extubated POD#0, doing well.    Last Vitals:  Vitals:   10/02/18 0700 10/02/18 0800  BP:    Pulse: 88 91  Resp:  18  Temp: 37.1 C 37 C  SpO2: 95% 97%    Last Pain:  Vitals:   10/02/18 0800  TempSrc: Core  PainSc: [place]

## 2018-10-02 NOTE — Progress Notes (Signed)
Dangled patient at bedside.  BP remained stable with no drop in pressure.  Assisted to standing position to obtain weight, patient became dizzy and diaphoretic.  Drop in BP but quickly recovered after getting back up in the bed.  Monitoring

## 2018-10-02 NOTE — Op Note (Signed)
NAME: Mcnamee SR., Kirubel W. MEDICAL RECORD ID:[id number] ACCOUNT [id number] DATE OF BIRTH:Mar 09, 1946 FACILITY: [facility] LOCATION: [facility] PHYSICIAN:Jhoselin Crume Chaya Jan, MD  OPERATIVE REPORT  DATE OF PROCEDURE:  10/01/2018  PREOPERATIVE DIAGNOSES:  Three-vessel coronary artery disease, status post myocardial infarction.  POSTOPERATIVE DIAGNOSIS:  Three-vessel coronary artery disease, status post myocardial  infarction.  PROCEDURE:  Median sternotomy, extracorporeal circulation, Coronary artery bypass grafting x 4  Free Left internal mammary artery to left anterior descending,  Saphenous vein graft to first diagonal,  Saphenous vein graft to posterior descending,  Saphenous vein  graft to the posterolateral (Y graft off of PD vein), Endoscopic vein harvest right leg.  SURGEON:  Modesto Charon, MD  ASSISTANT:  Jadene Pierini, PA-C   ANESTHESIA:  General.  FINDINGS:  Morbidly obese.  Stiff chest wall. Avulsion of mammary branch.  Mammary otherwise good quality.  Vein good quality.  Targets fair quality.  Preserved left ventricular function with no significant valvular pathology on echocardiogram.  CLINICAL NOTE:  Martin [name] is a 73 year old gentleman who presented with chest pain.  He had ST elevation myocardial infarction and was taken emergently to the catheterization laboratory.  His ST elevation resolved on arrival to the cath lab.  At  catheterization, he was found to have 3-vessel coronary disease with a thrombus in the LAD, but he had good flow around the thrombus into the distal vessel.  He did have diffusely diseased target vessels.  He was referred for coronary artery bypass grafting.   The indications, risks, benefits, and alternatives were discussed in detail with the patient.  He understood and accepted the risks and agreed to proceed.  OPERATIVE NOTE:  Mr. Jou was brought to the preoperative holding area on 10/01/2018.  Anesthesia placed a Swan-Ganz catheter and an arterial  blood pressure monitor line.  Intravenous antibiotics were administered.  He was taken to the operating room,  anesthetized and intubated.  A Foley catheter was placed.  The chest, abdomen and legs were prepped and draped in the usual sterile fashion.  Dr. Fransisco Beau performed transesophageal echocardiography.  Please refer to his separately dictated note for full  details, but briefly, there was preserved ventricular function and no significant valvular pathology.  A timeout was performed.  An incision was made in the medial aspect of the right leg at the level of the knee.  The greater saphenous vein was harvested from mid-calf to the groin endoscopically.  Saphenous vein was a good quality vessel.  Simultaneously  with that, a median sternotomy was performed, and the left internal mammary artery was harvested under direct vision.  Two thousand units of heparin was administered during the vessel harvest.  The mammary takedown was difficult due to the patient's  body habitus.  While clipping a branch proximally on the mammary artery, the branch avulsed from the artery at the base.  Bulldogs were placed proximally and distally, and suture repair was attempted; however, there was sluggish flow through the mammary  when divided distally, and a probe did not pass easily through that area.  Therefore, the decision was made to use the mammary as a free graft.  After harvesting the conduits, the remainder of the full heparin dose was given.  The pericardium was opened.  The ascending aorta was inspected.  There was no evidence of atherosclerotic disease.  The aorta was cannulated via concentric 2-0 Ethibond  pledgeted pursestring sutures.  A dual-stage venous cannula was placed via pursestring suture in the right atrial appendage.  Cardiopulmonary bypass was  initiated.  Flows were maintained per protocol.  The patient was cooled to 32 degrees Celsius.  The  coronary arteries were inspected and anastomotic sites were  chosen.  Of note, the distal OM branches were far too small to consider for grafting.  The remaining targets were diffusely diseased, fair quality.  The conduits were inspected and cut to  length.  A foam pad was placed in the pericardium to insulate the heart.  A temperature probe was placed in the myocardial septum, and a cardioplegia cannula was placed in the ascending aorta.  The aorta was cross-clamped.  The left ventricle was emptied via the aortic root vent.  Cardiac arrest then was achieved with a combination of cold antegrade blood cardioplegia and topical iced saline. 1.5 L of cardioplegia  was administered.  There was a  rapid diastolic arrest and septal cooling to 10 degrees Celsius.  The following distal anastomoses were performed.  First, a reversed saphenous vein graft was placed end-to-side to the posterior descending branch of the right coronary.  This was a large vessel.  It did have diffuse plaque throughout its course.  It was grafted proximally.  It was fair quality.  The  vein graft was of good quality.  The end-to-side anastomosis was performed with a running 7-0 Prolene suture.  A probe passed easily proximally and distally.  At the completion of the anastomosis, cardioplegia was administered down the vein graft.  There  was good flow and good hemostasis.  A reversed saphenous vein graft was placed end-to-side to the first diagonal branch of the LAD.  This was a 1.5 mm fair quality target.  Only a 1 mm probe would pass distally.  The vein was of good quality.  The end-to-side anastomosis was performed with  a running 7-0 Prolene suture.  Again, a probe passed easily proximally and distally, and cardioplegia was administered down the graft with good flow and good hemostasis.  Additional cardioplegia was administered down the aortic root.  The heart was elevated.  The distal OM branches were inspected again and were felt to be too small to graft.  Therefore, a vein graft was placed  end-to-side to the second posterolateral  branch of the right coronary.  This was the only graftable posterolateral branch.  It was a 1.5 mm fair quality target.  The graft was anastomosed end-to-side with a running 7-0 Prolene suture.  At the completion of the graft, the probe passed easily proximally  and distally.  Cardioplegia was administered.  There was good flow and good hemostasis.  The distal end of the free left mammary graft was beveled.  It was anastomosed end-to-side to the distal LAD.  The LAD was a relatively small and diffusely diseased vessel.  It also only accepted a 1 mm probe distally, but did accept a 1.5 mm probe  proximally.  An end-to-side anastomosis was performed with a running 8-0 Prolene suture.  At the completion of the mammary to LAD anastomosis, additional cardioplegia was administered down the vein grafts and the aortic root.  There was good backbleeding  from the mammary artery.  The vein grafts were cut to length.  The proximal anastomoses for the diagonal and posterior descending vein grafts were performed to 4.5 mm punch aortotomies with running 6-0 Prolene sutures.  After completion of the second  proximal, the patient was placed in Trendelenburg position.  Lidocaine was administered.  The aortic root was deaired, and the aortic cross-clamp was removed.  The total cross-clamp  time was 73 minutes.  The patient initially fibrillated but converted to  sinus rhythm after a single defibrillation with 10 joules.  Bulldog clamps were placed proximally and distally on the PD graft, and a longitudinal venotomy was made.  The proximal anastomosis for the PL graft was performed as a Y graft off the PD graft with an end-to-side anastomosis using a running 7-0 Prolene  suture.  The grafts were deaired prior to removing the proximal clamp.  Bulldog clamps were placed proximally and distally on the vein to the diagonal, and a longitudinal venotomy was made in the hood of the vein  graft.  The proximal anastomosis for the LAD was performed to this in an end-to-side fashion with a running 7-0  Prolene suture.  Again, deairing was performed before reestablishing flow.  All proximal and distal anastomoses were inspected for hemostasis.  Epicardial pacing wires were placed on the right ventricle and right atrium.  When the patient had rewarmed to  a core temperature of 37 degrees Celsius, he was weaned from cardiopulmonary bypass on the first attempt.  Total bypass time was 131 minutes.  A low-dose dopamine infusion was initiated near the end of cardiopulmonary bypass  after anesthesia notified me that the urine output was relatively low on bypass.  The initial cardiac index was greater than 2.5 L/min per sq m.  The patient remained hemodynamically stable throughout the post-bypass period.  Post-bypass transesophageal echocardiography was unchanged from the pre-bypass study with the exception of some septal dyskinesis related to DDD pacing.  The patient did develop a supraventricular tachycardia, which converted to sinus rhythm after  cardioversion with 5 joules synchronized.  The septal dyskinesis had resolved by that point, and the patient remained hemodynamically stable throughout the post-bypass period.  A test dose of protamine was administered and was well tolerated.  The atrial and aortic cannulae were removed.  The remainder of the protamine was administered without incident.  The chest was irrigated with warm saline.  Hemostasis was achieved.  The  pericardium was reapproximated over the ascending aorta with interrupted 3-0 silk sutures and came together without tension or kinking of the underlying grafts.  Left pleural and mediastinal chest tubes were placed through separate subcostal incisions.   The sternum was closed with a combination of single and double heavy gauge stainless steel wires.  The pectoralis fascia, subcutaneous tissue and skin were closed in standard fashion.   All sponge, needle and instrument counts were correct at the end of  the procedure.  The patient was taken from the operating room to the surgical intensive care unit, intubated and in good condition.  LN/NUANCE  D:10/01/2018 T:10/02/2018 JOB:[id number]

## 2018-10-02 NOTE — Progress Notes (Addendum)
[facility] DAILY ICU PROGRESS NOTE                   [address]            [place] [address]          [phone]   1 Day Post-Op Procedure(s) (LRB): CORONARY ARTERY BYPASS GRAFTING (CABG) TIMES  FOUR USING LEFT MAMMARY ARTERY AND RIGHT GREATER SAPHEANOUS VEIN HARVESTED ENDOSCOPICALLY (N/A) TRANSESOPHAGEAL ECHOCARDIOGRAM (TEE) (N/A)  Total Length of Stay:  LOS: 3 days   Subjective: Minor pain Some orthostasis with dangling  Objective: Vital signs in last 24 hours: Temp:  [98.2 F (36.8 C)-99.9 F (37.7 C)] 98.8 F (37.1 C) (06/05 0700) Pulse Rate:  [88-115] 88 (06/05 0700) Cardiac Rhythm: Normal sinus rhythm (06/04 2000) Resp:  [16-22] 16 (06/04 1529) BP: (115-130)/(55-61) 115/55 (06/04 1529) SpO2:  [95 %-100 %] 95 % (06/05 0700) Arterial Line BP: (92-170)/(42-71) 109/47 (06/05 0700) FiO2 (%):  [40 %-50 %] 40 % (06/04 1748)  Filed Weights   09/29/18 1153 09/30/18 0451 10/01/18 0410  Weight: 133.8 kg 134.2 kg 132.1 kg    Weight change:    Hemodynamic parameters for last 24 hours: PAP: (18-33)/(6-20) 20/10 CO:  [5.3 L/min-9.1 L/min] 6 L/min CI:  [2.1 L/min/m2-3.7 L/min/m2] 2.4 L/min/m2  Intake/Output from previous day: 06/04 0701 - 06/05 0700 In: 6444.2 [I.V.:4239.9; Blood:339; IV Piggyback:1865.3] Out: 2803 [Urine:1529; Blood:850; Chest Tube:424]  Intake/Output this shift: No intake/output data recorded.  Current Meds: Scheduled Meds: . acetaminophen  1,000 mg Oral Q6H   Or  . acetaminophen (TYLENOL) oral liquid 160 mg/5 mL  1,000 mg Per Tube Q6H  . aspirin EC  325 mg Oral Daily   Or  . aspirin  324 mg Per Tube Daily  . bisacodyl  10 mg Oral Daily   Or  . bisacodyl  10 mg Rectal Daily  . Chlorhexidine Gluconate Cloth  6 each Topical Daily  . docusate sodium  200 mg Oral Daily  . insulin regular  0-10 Units Intravenous TID WC  . mouth rinse  15 mL Mouth Rinse BID  . metoprolol tartrate  12.5 mg Oral BID   Or  . metoprolol tartrate  12.5  mg Per Tube BID  . [START ON 10/03/2018] pantoprazole  40 mg Oral Daily  . rosuvastatin  40 mg Oral q1800  . sodium chloride flush  10-40 mL Intracatheter Q12H  . sodium chloride flush  3 mL Intravenous Q12H   Continuous Infusions: . sodium chloride 20 mL/hr at 10/02/18 0700  . sodium chloride Stopped (10/02/18 0505)  . sodium chloride 20 mL/hr at 10/01/18 1519  . albumin human Stopped (10/01/18 1930)  . cefUROXime (ZINACEF)  IV Stopped (10/02/18 0536)  . dexmedetomidine (PRECEDEX) IV infusion Stopped (10/01/18 1534)  . DOPamine 3 mcg/kg/min (10/02/18 0700)  . insulin 6.7 mL/hr at 10/02/18 0700  . lactated ringers 20 mL/hr at 10/01/18 2318  . lactated ringers 20 mL/hr at 10/02/18 0700  . nitroGLYCERIN    . phenylephrine (NEO-SYNEPHRINE) Adult infusion 20 mcg/min (10/02/18 0700)   PRN Meds:.sodium chloride, albumin human, metoprolol tartrate, midazolam, morphine injection, ondansetron (ZOFRAN) IV, oxyCODONE, sodium chloride flush, sodium chloride flush, traMADol  General appearance: alert, cooperative and no distress Heart: regular rate and rhythm Lungs: mildly dim in bases Abdomen: soft, non-tender Extremities: + edema Wound: evh site healing well, chest dressing CDI  Lab Results: CBC: Recent Labs    10/01/18 2009 10/02/18 0208  WBC 10.5 14.8*  HGB  7.5*  7.9* 8.1*  HCT 22.0*  23.9* 24.4*  PLT 199 258   BMET:  Recent Labs    10/01/18 0358  10/01/18 2009 10/02/18 0208  NA 133*   < > 138 136  K 4.8   < > 4.3 4.1  CL 99  --  103 106  CO2 25  --   --  22  GLUCOSE 315*   < > 150* 118*  BUN 17  --  18 18  CREATININE 1.27*  --  1.10  1.11 1.23  CALCIUM 8.9  --   --  7.7*   < > = values in this interval not displayed.    CMET: Lab Results  Component Value Date   WBC 14.8 (H) 10/02/2018   HGB 8.1 (L) 10/02/2018   HCT 24.4 (L) 10/02/2018   PLT 258 10/02/2018   GLUCOSE 118 (H) 10/02/2018   CHOL 205 (H) 09/29/2018   TRIG 195 (H) 09/29/2018   HDL 27 (L) 09/29/2018    LDLDIRECT 173.0 07/01/2018   LDLCALC 139 (H) 09/29/2018   ALT 22 09/29/2018   AST 32 09/29/2018   NA 136 10/02/2018   K 4.1 10/02/2018   CL 106 10/02/2018   CREATININE 1.23 10/02/2018   BUN 18 10/02/2018   CO2 22 10/02/2018   TSH 4.13 08/08/2015   PSA 0.38 08/08/2015   INR 1.4 (H) 10/01/2018   HGBA1C 8.1 (H) 09/29/2018   MICROALBUR 41.6 (H) 08/08/2015      PT/INR:  Recent Labs    10/01/18 1442  LABPROT 17.0*  INR 1.4*   Radiology: Dg Chest Port 1 View  Result Date: 10/01/2018 CLINICAL DATA:  Prior CABG. EXAM: PORTABLE CHEST 1 VIEW COMPARISON:  09/29/2018. FINDINGS: Endotracheal tube tip 2.5 cm above the carina. Mediastinal drainage catheter noted over the mid chest. NG tube noted with tip below left hemidiaphragm. Swan-Ganz catheter with tip over right main pulmonary artery. Left chest tube noted with tip over the left lung base. Prior CABG. Cardiomegaly. Bibasilar atelectasis/infiltrates and bilateral pleural effusions. IMPRESSION: 1.  Lines and tubes in good anatomic position as above. 2.  Prior CABG.  Cardiomegaly.  No pulmonary venous congestion. 3. Bibasilar atelectasis/infiltrates and bilateral pleural effusions. Electronically Signed   By: Marcello Moores  Register   On: 10/01/2018 15:09     Assessment/Plan: S/P Procedure(s) (LRB): CORONARY ARTERY BYPASS GRAFTING (CABG) TIMES  FOUR USING LEFT MAMMARY ARTERY AND RIGHT GREATER SAPHEANOUS VEIN HARVESTED ENDOSCOPICALLY (N/A) TRANSESOPHAGEAL ECHOCARDIOGRAM (TEE) (N/A)  1 doing well, POD#1 2 hemodyn stable in sinus rhythm. Adeq cardiac indicies/PAP on neo and dopamine. Wean as able 3 sats good on 4 liters- routine pulm toilet/mobilization 4 CXR - no significant infilt/congestion/effusions 5 CT - mod drainage , ok to d/c tubes 6 ABL anemia - monitor 7 volume overload, will give albumin and lasix  8 mild leukocytosis- systemic inflammatory response, monitor 9 BS stable control- will need high dose levimir, wean glucommander off  10 d/c aline/swan     John Giovanni PA-C 10/02/2018 7:59 AM  Pager [phone]  Patient seen and examined, agree with above Looks good ECG showed inferior ST elevation immediate postop, improved on recheck and now resolved completely, suspect he may have had air in one of the vein grafts. Good hemodynamics- wean neo and dopamine as tolerated  Remo Lipps C. Roxan Hockey, MD [facility] [phone]

## 2018-10-02 NOTE — Plan of Care (Signed)
Patient remains drowsy, but follows commands.  Encouraged to deep breath and cough.  Patient vagals with coughing, with BP dropping to 82'K systolic, but recovers quickly.  Will delay dangling for now.

## 2018-10-03 ENCOUNTER — Inpatient Hospital Stay (HOSPITAL_COMMUNITY): Payer: PPO

## 2018-10-03 DIAGNOSIS — Z951 Presence of aortocoronary bypass graft: Secondary | ICD-10-CM

## 2018-10-03 LAB — BASIC METABOLIC PANEL
Anion gap: 8 (ref 5–15)
BUN: 28 mg/dL — ABNORMAL HIGH (ref 8–23)
CO2: 23 mmol/L (ref 22–32)
Calcium: 7.5 mg/dL — ABNORMAL LOW (ref 8.9–10.3)
Chloride: 103 mmol/L (ref 98–111)
Creatinine, Ser: 1.91 mg/dL — ABNORMAL HIGH (ref 0.61–1.24)
GFR calc Af Amer: 39 mL/min — ABNORMAL LOW (ref >60)
GFR calc non Af Amer: 34 mL/min — ABNORMAL LOW (ref >60)
Glucose, Bld: 234 mg/dL — ABNORMAL HIGH (ref 70–99)
Potassium: 4.6 mmol/L (ref 3.5–5.1)
Sodium: 134 mmol/L — ABNORMAL LOW (ref 135–145)

## 2018-10-03 LAB — CBC
HCT: 22.5 % — ABNORMAL LOW (ref 39.0–52.0)
Hemoglobin: 7.1 g/dL — ABNORMAL LOW (ref 13.0–17.0)
MCH: 29.5 pg (ref 26.0–34.0)
MCHC: 31.6 g/dL (ref 30.0–36.0)
MCV: 93.4 fL (ref 80.0–100.0)
Platelets: 196 10*3/uL (ref 150–400)
RBC: 2.41 MIL/uL — ABNORMAL LOW (ref 4.22–5.81)
RDW: 15.2 % (ref 11.5–15.5)
WBC: 12.4 10*3/uL — ABNORMAL HIGH (ref 4.0–10.5)
nRBC: 0 % (ref 0.0–0.2)

## 2018-10-03 LAB — GLUCOSE, CAPILLARY
Glucose-Capillary: 152 mg/dL — ABNORMAL HIGH (ref 70–99)
Glucose-Capillary: 206 mg/dL — ABNORMAL HIGH (ref 70–99)
Glucose-Capillary: 215 mg/dL — ABNORMAL HIGH (ref 70–99)
Glucose-Capillary: 225 mg/dL — ABNORMAL HIGH (ref 70–99)
Glucose-Capillary: 230 mg/dL — ABNORMAL HIGH (ref 70–99)
Glucose-Capillary: 255 mg/dL — ABNORMAL HIGH (ref 70–99)

## 2018-10-03 LAB — TYPE AND SCREEN
ABO/RH(D): O POS
Antibody Screen: NEGATIVE

## 2018-10-03 MED ORDER — DORZOLAMIDE HCL 2 % OP SOLN
1.0000 [drp] | Freq: Three times a day (TID) | OPHTHALMIC | Status: DC
  Administered 2018-10-03 – 2018-10-19 (×50): 1 [drp] via OPHTHALMIC
  Filled 2018-10-03 (×2): qty 10

## 2018-10-03 MED ORDER — SODIUM CHLORIDE 0.9% IV SOLUTION
Freq: Once | INTRAVENOUS | Status: AC
  Administered 2018-10-03: 10:00:00 via INTRAVENOUS

## 2018-10-03 MED ORDER — INSULIN ASPART 100 UNIT/ML ~~LOC~~ SOLN: 0.0000 [IU] | Freq: Every day | SUBCUTANEOUS | Status: DC

## 2018-10-03 MED ORDER — INSULIN ASPART 100 UNIT/ML ~~LOC~~ SOLN
20.0000 [IU] | Freq: Three times a day (TID) | SUBCUTANEOUS | Status: DC
  Administered 2018-10-03 (×2): 20 [IU] via SUBCUTANEOUS

## 2018-10-03 MED ORDER — INSULIN DETEMIR 100 UNIT/ML ~~LOC~~ SOLN
75.0000 [IU] | Freq: Two times a day (BID) | SUBCUTANEOUS | Status: DC
  Administered 2018-10-03 – 2018-10-06 (×8): 75 [IU] via SUBCUTANEOUS
  Filled 2018-10-03 (×10): qty 0.75

## 2018-10-03 MED ORDER — FUROSEMIDE 10 MG/ML IJ SOLN
40.0000 mg | Freq: Once | INTRAMUSCULAR | Status: AC
  Administered 2018-10-03: 40 mg via INTRAVENOUS
  Filled 2018-10-03: qty 4

## 2018-10-03 MED ORDER — INSULIN ASPART 100 UNIT/ML ~~LOC~~ SOLN
0.0000 [IU] | Freq: Three times a day (TID) | SUBCUTANEOUS | Status: DC
  Administered 2018-10-03 (×2): 7 [IU] via SUBCUTANEOUS
  Administered 2018-10-04: 3 [IU] via SUBCUTANEOUS
  Administered 2018-10-04 – 2018-10-05 (×4): 4 [IU] via SUBCUTANEOUS
  Administered 2018-10-06: 16:00:00 11 [IU] via SUBCUTANEOUS
  Administered 2018-10-06 – 2018-10-08 (×4): 3 [IU] via SUBCUTANEOUS
  Administered 2018-10-08 – 2018-10-10 (×6): 4 [IU] via SUBCUTANEOUS
  Administered 2018-10-11: 3 [IU] via SUBCUTANEOUS
  Administered 2018-10-11: 4 [IU] via SUBCUTANEOUS
  Administered 2018-10-11: 7 [IU] via SUBCUTANEOUS
  Administered 2018-10-12: 6 [IU] via SUBCUTANEOUS
  Administered 2018-10-12 – 2018-10-13 (×2): 4 [IU] via SUBCUTANEOUS
  Administered 2018-10-13: 12:00:00 3 [IU] via SUBCUTANEOUS
  Administered 2018-10-15: 4 [IU] via SUBCUTANEOUS
  Administered 2018-10-15: 17:00:00 2 [IU] via SUBCUTANEOUS
  Administered 2018-10-16 (×2): 4 [IU] via SUBCUTANEOUS
  Administered 2018-10-16 – 2018-10-17 (×2): 11 [IU] via SUBCUTANEOUS
  Administered 2018-10-17: 7 [IU] via SUBCUTANEOUS
  Administered 2018-10-17: 4 [IU] via SUBCUTANEOUS
  Administered 2018-10-18: 7 [IU] via SUBCUTANEOUS
  Administered 2018-10-19 (×3): 4 [IU] via SUBCUTANEOUS

## 2018-10-03 MED ORDER — BRIMONIDINE TARTRATE 0.2 % OP SOLN
1.0000 [drp] | Freq: Two times a day (BID) | OPHTHALMIC | Status: DC
  Administered 2018-10-03 – 2018-10-19 (×33): 1 [drp] via OPHTHALMIC
  Filled 2018-10-03 (×2): qty 5

## 2018-10-03 NOTE — Progress Notes (Signed)
2 Days Post-Op Procedure(s) (LRB): CORONARY ARTERY BYPASS GRAFTING (CABG) TIMES  FOUR USING LEFT MAMMARY ARTERY AND RIGHT GREATER SAPHEANOUS VEIN HARVESTED ENDOSCOPICALLY (N/A) TRANSESOPHAGEAL ECHOCARDIOGRAM (TEE) (N/A) Subjective: Some incisional pain, still has significant orthostatic hypotension with mobilization  Objective: Vital signs in last 24 hours: Temp:  [97.9 F (36.6 C)-98.8 F (37.1 C)] 97.9 F (36.6 C) (06/06 0700) Pulse Rate:  [82-100] 88 (06/06 0700) Cardiac Rhythm: Normal sinus rhythm (06/06 0400) Resp:  [16-33] 27 (06/06 0700) BP: (92-136)/(36-64) 134/64 (06/06 0700) SpO2:  [90 %-100 %] 90 % (06/06 0700) Arterial Line BP: (103-151)/(44-69) 135/55 (06/06 0700) Weight:  [139.1 kg] 139.1 kg (06/06 0600)  Hemodynamic parameters for last 24 hours: PAP: (22-23)/(18-20) 23/20 CO:  [5.8 L/min] 5.8 L/min CI:  [2.3 L/min/m2] 2.3 L/min/m2  Intake/Output from previous day: 06/05 0701 - 06/06 0700 In: 918 [I.V.:469.6; IV Piggyback:448.5] Out: 1090 [Urine:980; Chest Tube:110] Intake/Output this shift: No intake/output data recorded.  General appearance: alert, cooperative and no distress Neurologic: intact Heart: regular rate and rhythm Lungs: diminished breath sounds bibasilar Abdomen: normal findings: soft, non-tender  Lab Results: Recent Labs    10/02/18 1619 10/03/18 0430  WBC 13.9* 12.4*  HGB 7.4* 7.1*  HCT 22.5* 22.5*  PLT 197 196   BMET:  Recent Labs    10/02/18 1619 10/03/18 0430  NA 136 134*  K 4.6 4.6  CL 104 103  CO2 20* 23  GLUCOSE 188* 234*  BUN 22 28*  CREATININE 1.54* 1.91*  CALCIUM 7.7* 7.5*    PT/INR:  Recent Labs    10/01/18 1442  LABPROT 17.0*  INR 1.4*   ABG    Component Value Date/Time   PHART 7.343 (L) 10/01/2018 1946   HCO3 21.5 10/01/2018 1946   TCO2 22 10/01/2018 2009   ACIDBASEDEF 4.0 (H) 10/01/2018 1946   O2SAT 95.0 10/01/2018 1946   CBG (last 3)  Recent Labs    10/02/18 1928 10/03/18 0005 10/03/18 0321   GLUCAP 199* 255* 230*    Assessment/Plan: S/P Procedure(s) (LRB): CORONARY ARTERY BYPASS GRAFTING (CABG) TIMES  FOUR USING LEFT MAMMARY ARTERY AND RIGHT GREATER SAPHEANOUS VEIN HARVESTED ENDOSCOPICALLY (N/A) TRANSESOPHAGEAL ECHOCARDIOGRAM (TEE) (N/A) -  CV- in SR, on ASA, metoprolol, rosuvastatin  Orthostatic hypotension- will transfuse 1 unit for symptomatic anemia  RESP- atelectasis- IS, add flutter  RENAL- creatinine elevated at 1.9 c/w acute kidney injury  Restart dopamine, transfuse to increase intravascular volume, then diurese  ENDO- CBG still elevated- increase levemir to 75 BID, start novolog 20 units with meals  Anemia secondary to ABL- Hct 22- symptomatic- transfuse 1 unit  SCD + enoxaparin for DVT prophylaxis  LOS: 4 days    Martin [name] 10/03/2018

## 2018-10-03 NOTE — Progress Notes (Signed)
      [address]       [place] [address]             [phone]      Up in chair all day, now back in bed BP (!) 145/72 (BP Location: Left Arm)   Pulse 93   Temp 98.5 F (36.9 C) (Oral)   Resp (!) 26   Ht 6' (1.829 m)   Wt (!) 139.1 kg   SpO2 96%   BMI 41.59 kg/m   Intake/Output Summary (Last 24 hours) at 10/03/2018 1654 Last data filed at 10/03/2018 1600 Gross per 24 hour  Intake 1166.6 ml  Output 1055 ml  Net 111.6 ml   Uo has picked up a little  Able to walk a short distance after transfusion  Remo Lipps C. Roxan Hockey, MD [facility] [phone]

## 2018-10-04 ENCOUNTER — Inpatient Hospital Stay (HOSPITAL_COMMUNITY): Payer: PPO

## 2018-10-04 DIAGNOSIS — I25110 Atherosclerotic heart disease of native coronary artery with unstable angina pectoris: Secondary | ICD-10-CM

## 2018-10-04 DIAGNOSIS — I2510 Atherosclerotic heart disease of native coronary artery without angina pectoris: Secondary | ICD-10-CM

## 2018-10-04 LAB — BPAM RBC
Blood Product Expiration Date: 202006242359
ISSUE DATE / TIME: 202006060957
Unit Type and Rh: 5100

## 2018-10-04 LAB — BASIC METABOLIC PANEL
Anion gap: 9 (ref 5–15)
BUN: 31 mg/dL — ABNORMAL HIGH (ref 8–23)
CO2: 24 mmol/L (ref 22–32)
Calcium: 7.7 mg/dL — ABNORMAL LOW (ref 8.9–10.3)
Chloride: 99 mmol/L (ref 98–111)
Creatinine, Ser: 1.48 mg/dL — ABNORMAL HIGH (ref 0.61–1.24)
GFR calc Af Amer: 53 mL/min — ABNORMAL LOW (ref >60)
GFR calc non Af Amer: 46 mL/min — ABNORMAL LOW (ref >60)
Glucose, Bld: 226 mg/dL — ABNORMAL HIGH (ref 70–99)
Potassium: 4.3 mmol/L (ref 3.5–5.1)
Sodium: 132 mmol/L — ABNORMAL LOW (ref 135–145)

## 2018-10-04 LAB — GLUCOSE, CAPILLARY
Glucose-Capillary: 178 mg/dL — ABNORMAL HIGH (ref 70–99)
Glucose-Capillary: 157 mg/dL — ABNORMAL HIGH (ref 70–99)
Glucose-Capillary: 186 mg/dL — ABNORMAL HIGH (ref 70–99)
Glucose-Capillary: 190 mg/dL — ABNORMAL HIGH (ref 70–99)
Glucose-Capillary: 139 mg/dL — ABNORMAL HIGH (ref 70–99)
Glucose-Capillary: 117 mg/dL — ABNORMAL HIGH (ref 70–99)

## 2018-10-04 LAB — TYPE AND SCREEN
ABO/RH(D): O POS
Antibody Screen: NEGATIVE
Unit division: 0

## 2018-10-04 LAB — CBC
HCT: 24.6 % — ABNORMAL LOW (ref 39.0–52.0)
Hemoglobin: 7.9 g/dL — ABNORMAL LOW (ref 13.0–17.0)
MCH: 29.8 pg (ref 26.0–34.0)
MCHC: 32.1 g/dL (ref 30.0–36.0)
MCV: 92.8 fL (ref 80.0–100.0)
Platelets: 207 10*3/uL (ref 150–400)
RBC: 2.65 MIL/uL — ABNORMAL LOW (ref 4.22–5.81)
RDW: 15.2 % (ref 11.5–15.5)
WBC: 12.3 10*3/uL — ABNORMAL HIGH (ref 4.0–10.5)
nRBC: 0.2 % (ref 0.0–0.2)

## 2018-10-04 MED ORDER — LEVALBUTEROL HCL 0.63 MG/3ML IN NEBU
0.6300 mg | INHALATION_SOLUTION | Freq: Four times a day (QID) | RESPIRATORY_TRACT | Status: DC | PRN
  Administered 2018-10-04 – 2018-10-08 (×4): 0.63 mg via RESPIRATORY_TRACT
  Filled 2018-10-04 (×3): qty 3

## 2018-10-04 MED ORDER — FUROSEMIDE 10 MG/ML IJ SOLN
40.0000 mg | Freq: Once | INTRAMUSCULAR | Status: AC
  Administered 2018-10-04: 40 mg via INTRAVENOUS

## 2018-10-04 MED ORDER — INSULIN ASPART 100 UNIT/ML ~~LOC~~ SOLN
25.0000 [IU] | Freq: Three times a day (TID) | SUBCUTANEOUS | Status: DC
  Administered 2018-10-04 – 2018-10-19 (×13): 25 [IU] via SUBCUTANEOUS

## 2018-10-04 MED ORDER — LEVALBUTEROL HCL 0.63 MG/3ML IN NEBU
INHALATION_SOLUTION | RESPIRATORY_TRACT | Status: AC
  Administered 2018-10-04: 0.63 mg via RESPIRATORY_TRACT
  Filled 2018-10-04: qty 3

## 2018-10-04 NOTE — Progress Notes (Signed)
      [address]       [place] [address]             [phone]      Up in chair this afternoon  BP (!) 161/69   Pulse 92   Temp (!) 96.8 F (36 C) (Axillary)   Resp 12   Ht 6' (1.829 m)   Wt (!) 137.5 kg   SpO2 97%   BMI 41.11 kg/m   Some improvement with IS and mobility CBG better  Remo Lipps C. Roxan Hockey, MD [facility] [phone]

## 2018-10-04 NOTE — Progress Notes (Signed)
3 Days Post-Op Procedure(s) (LRB): CORONARY ARTERY BYPASS GRAFTING (CABG) TIMES  FOUR USING LEFT MAMMARY ARTERY AND RIGHT GREATER SAPHEANOUS VEIN HARVESTED ENDOSCOPICALLY (N/A) TRANSESOPHAGEAL ECHOCARDIOGRAM (TEE) (N/A) Subjective: C/o feeling weak, SOB earlier- a little better now  Objective: Vital signs in last 24 hours: Temp:  [97.9 F (36.6 C)-98.6 F (37 C)] 98.6 F (37 C) (06/07 0400) Pulse Rate:  [80-101] 92 (06/07 0700) Cardiac Rhythm: Normal sinus rhythm (06/07 0400) Resp:  [12-33] 12 (06/07 0700) BP: (88-166)/(50-84) 161/69 (06/07 0700) SpO2:  [92 %-100 %] 97 % (06/07 0700) Arterial Line BP: (134-138)/(54-65) 134/65 (06/06 0900) Weight:  [137.5 kg] 137.5 kg (06/07 0500)  Hemodynamic parameters for last 24 hours:    Intake/Output from previous day: 06/06 0701 - 06/07 0700 In: 1080.4 [P.O.:360; I.V.:405.4; Blood:315] Out: 1870 [UXNAT:[id number]] Intake/Output this shift: No intake/output data recorded.  General appearance: alert, cooperative and no distress Neurologic: intact Heart: regular rate and rhythm Lungs: diminished breath sounds bibasilar Abdomen: normal findings: soft, non-tender Wound: clean and dry  Lab Results: Recent Labs    10/03/18 0430 10/04/18 0529  WBC 12.4* 12.3*  HGB 7.1* 7.9*  HCT 22.5* 24.6*  PLT 196 207   BMET:  Recent Labs    10/03/18 0430 10/04/18 0529  NA 134* 132*  K 4.6 4.3  CL 103 99  CO2 23 24  GLUCOSE 234* 226*  BUN 28* 31*  CREATININE 1.91* 1.48*  CALCIUM 7.5* 7.7*    PT/INR:  Recent Labs    10/01/18 1442  LABPROT 17.0*  INR 1.4*   ABG    Component Value Date/Time   PHART 7.343 (L) 10/01/2018 1946   HCO3 21.5 10/01/2018 1946   TCO2 22 10/01/2018 2009   ACIDBASEDEF 4.0 (H) 10/01/2018 1946   O2SAT 95.0 10/01/2018 1946   CBG (last 3)  Recent Labs    10/03/18 1611 10/03/18 2143 10/04/18 0651  GLUCAP 206* 152* 157*    Assessment/Plan: S/P Procedure(s) (LRB): CORONARY ARTERY BYPASS GRAFTING (CABG)  TIMES  FOUR USING LEFT MAMMARY ARTERY AND RIGHT GREATER SAPHEANOUS VEIN HARVESTED ENDOSCOPICALLY (N/A) TRANSESOPHAGEAL ECHOCARDIOGRAM (TEE) (N/A) CV- stable in SR, hypertension- will reassess when dopamine off   RESP- primary issue is atelectasis which is severe on CXR. On pulling 250 ml on IS. Weak cough and poor effort with IS. Continue to encourage, continue flutter valve  RENAL- creatinine significantly improved c/w resolving AKI  Wean dopamine, IV lasix this AM  ENDO- CBG still elevated, increase meal coverage  SCD + enoxaparin for DVT prophylaxis  Deconditioning- continue to mobilize  Anemia improved post transfusion  LOS: 5 days    Melrose Nakayama 10/04/2018

## 2018-10-04 NOTE — Progress Notes (Signed)
EKG CRITICAL VALUE     12 lead EKG performed.  Critical value noted.  douglass, RN notified.   Genia Plants, CCT 10/04/2018 10:25 AM

## 2018-10-05 ENCOUNTER — Inpatient Hospital Stay (HOSPITAL_COMMUNITY): Payer: PPO

## 2018-10-05 LAB — BASIC METABOLIC PANEL
Anion gap: 7 (ref 5–15)
BUN: 36 mg/dL — ABNORMAL HIGH (ref 8–23)
CO2: 27 mmol/L (ref 22–32)
Calcium: 7.7 mg/dL — ABNORMAL LOW (ref 8.9–10.3)
Chloride: 101 mmol/L (ref 98–111)
Creatinine, Ser: 1.49 mg/dL — ABNORMAL HIGH (ref 0.61–1.24)
GFR calc Af Amer: 53 mL/min — ABNORMAL LOW (ref >60)
GFR calc non Af Amer: 46 mL/min — ABNORMAL LOW (ref >60)
Glucose, Bld: 85 mg/dL (ref 70–99)
Potassium: 4.1 mmol/L (ref 3.5–5.1)
Sodium: 135 mmol/L (ref 135–145)

## 2018-10-05 LAB — CBC
HCT: 22.9 % — ABNORMAL LOW (ref 39.0–52.0)
Hemoglobin: 7.3 g/dL — ABNORMAL LOW (ref 13.0–17.0)
MCH: 29.9 pg (ref 26.0–34.0)
MCHC: 31.9 g/dL (ref 30.0–36.0)
MCV: 93.9 fL (ref 80.0–100.0)
Platelets: 232 10*3/uL (ref 150–400)
RBC: 2.44 MIL/uL — ABNORMAL LOW (ref 4.22–5.81)
RDW: 15.2 % (ref 11.5–15.5)
WBC: 10.4 10*3/uL (ref 4.0–10.5)
nRBC: 0 % (ref 0.0–0.2)

## 2018-10-05 LAB — GLUCOSE, CAPILLARY
Glucose-Capillary: 83 mg/dL (ref 70–99)
Glucose-Capillary: 119 mg/dL — ABNORMAL HIGH (ref 70–99)
Glucose-Capillary: 130 mg/dL — ABNORMAL HIGH (ref 70–99)
Glucose-Capillary: 166 mg/dL — ABNORMAL HIGH (ref 70–99)
Glucose-Capillary: 147 mg/dL — ABNORMAL HIGH (ref 70–99)
Glucose-Capillary: 149 mg/dL — ABNORMAL HIGH (ref 70–99)
Glucose-Capillary: 162 mg/dL — ABNORMAL HIGH (ref 70–99)

## 2018-10-05 MED ORDER — HYDROCHLOROTHIAZIDE 25 MG PO TABS
25.0000 mg | ORAL_TABLET | Freq: Every day | ORAL | Status: DC
  Administered 2018-10-05 – 2018-10-14 (×10): 25 mg via ORAL
  Filled 2018-10-05 (×10): qty 1

## 2018-10-05 MED ORDER — POTASSIUM CHLORIDE 20 MEQ PO PACK
20.0000 meq | PACK | Freq: Two times a day (BID) | ORAL | Status: DC
  Administered 2018-10-05 – 2018-10-08 (×8): 20 meq via ORAL
  Filled 2018-10-05 (×9): qty 1

## 2018-10-05 MED ORDER — AMIODARONE HCL IN DEXTROSE 360-4.14 MG/200ML-% IV SOLN
60.0000 mg/h | INTRAVENOUS | Status: AC
  Administered 2018-10-05 (×2): 60 mg/h via INTRAVENOUS
  Filled 2018-10-05 (×2): qty 200

## 2018-10-05 MED ORDER — AMIODARONE LOAD VIA INFUSION
150.0000 mg | Freq: Once | INTRAVENOUS | Status: AC
  Administered 2018-10-05: 150 mg via INTRAVENOUS
  Filled 2018-10-05: qty 83.34

## 2018-10-05 MED ORDER — FUROSEMIDE 40 MG PO TABS
40.0000 mg | ORAL_TABLET | Freq: Two times a day (BID) | ORAL | Status: DC
  Administered 2018-10-05 – 2018-10-06 (×3): 40 mg via ORAL
  Filled 2018-10-05 (×3): qty 1

## 2018-10-05 MED ORDER — AMIODARONE HCL IN DEXTROSE 360-4.14 MG/200ML-% IV SOLN
30.0000 mg/h | INTRAVENOUS | Status: AC
  Administered 2018-10-05 – 2018-10-06 (×2): 30 mg/h via INTRAVENOUS
  Filled 2018-10-05: qty 200

## 2018-10-05 MED FILL — Heparin Sodium (Porcine) Inj 1000 Unit/ML: INTRAMUSCULAR | Qty: 30 | Status: AC

## 2018-10-05 MED FILL — Potassium Chloride Inj 2 mEq/ML: INTRAVENOUS | Qty: 40 | Status: AC

## 2018-10-05 MED FILL — Magnesium Sulfate Inj 50%: INTRAMUSCULAR | Qty: 10 | Status: AC

## 2018-10-05 NOTE — Progress Notes (Signed)
CT surgery p.m. Rounds  Patient ambulated one third of the unit hallway Maintaining sinus rhythm on IV amiodarone Oxygen saturation 95% on nasal cannula No bowel movement yet, CBGs well controlled

## 2018-10-05 NOTE — Progress Notes (Signed)
Pt complaining of bladder discomfort/inability to empty bladder. Bladder scan showed 605 mL in bladder. In and out cath performed per protocol. Peri care performed, Sterile technique used, Ramon, RN assisted. Removed 350 mL of urine with excess urine leaking around catheter. Post bladder residual 0 mL. Will continue to monitor

## 2018-10-05 NOTE — Progress Notes (Addendum)
4 Days Post-Op Procedure(s) (LRB): CORONARY ARTERY BYPASS GRAFTING (CABG) TIMES  FOUR USING LEFT MAMMARY ARTERY AND RIGHT GREATER SAPHEANOUS VEIN HARVESTED ENDOSCOPICALLY (N/A) TRANSESOPHAGEAL ECHOCARDIOGRAM (TEE) (N/A) Subjective: Feels ok, some SOB, ambulated this am, pain is currently controlled, no BM  Objective: Vital signs in last 24 hours: Temp:  [96.8 F (36 C)-98.2 F (36.8 C)] 98.1 F (36.7 C) (06/08 0655) Pulse Rate:  [71-97] 79 (06/08 0700) Cardiac Rhythm: Normal sinus rhythm (06/08 0400) Resp:  [21-38] 27 (06/08 0700) BP: (109-180)/(62-95) 148/69 (06/08 0700) SpO2:  [95 %-100 %] 98 % (06/08 0700) Weight:  [135.1 kg] 135.1 kg (06/08 0600)  Hemodynamic parameters for last 24 hours:    Intake/Output from previous day: 06/07 0701 - 06/08 0700 In: 540 [P.O.:540] Out: 1600 [Urine:1600] Intake/Output this shift: No intake/output data recorded.  General appearance: alert, cooperative, fatigued and no distress Heart: regular rate and rhythm Lungs: fair air exchange, dim R>L base Abdomen: soft, nontender Extremities: + BLE edema Wound: chest dressing with some serosang drainage, EVH site ok  Lab Results: Recent Labs    10/04/18 0529 10/05/18 0405  WBC 12.3* 10.4  HGB 7.9* 7.3*  HCT 24.6* 22.9*  PLT 207 232   BMET:  Recent Labs    10/04/18 0529 10/05/18 0405  NA 132* 135  K 4.3 4.1  CL 99 101  CO2 24 27  GLUCOSE 226* 85  BUN 31* 36*  CREATININE 1.48* 1.49*  CALCIUM 7.7* 7.7*    PT/INR: No results for input(s): LABPROT, INR in the last 72 hours. ABG    Component Value Date/Time   PHART 7.343 (L) 10/01/2018 1946   HCO3 21.5 10/01/2018 1946   TCO2 22 10/01/2018 2009   ACIDBASEDEF 4.0 (H) 10/01/2018 1946   O2SAT 95.0 10/01/2018 1946   CBG (last 3)  Recent Labs    10/04/18 1532 10/04/18 2144 10/05/18 0654  GLUCAP 139* 117* 83    Meds Scheduled Meds: . acetaminophen  1,000 mg Oral Q6H   Or  . acetaminophen (TYLENOL) oral liquid 160 mg/5  mL  1,000 mg Per Tube Q6H  . aspirin EC  325 mg Oral Daily   Or  . aspirin  324 mg Per Tube Daily  . bisacodyl  10 mg Oral Daily   Or  . bisacodyl  10 mg Rectal Daily  . brimonidine  1 drop Left Eye BID  . Chlorhexidine Gluconate Cloth  6 each Topical Daily  . docusate sodium  200 mg Oral Daily  . dorzolamide  1 drop Left Eye TID  . enoxaparin (LOVENOX) injection  40 mg Subcutaneous QHS  . insulin aspart  0-20 Units Subcutaneous TID WC  . insulin aspart  0-5 Units Subcutaneous QHS  . insulin aspart  25 Units Subcutaneous TID WC  . insulin detemir  75 Units Subcutaneous BID  . mouth rinse  15 mL Mouth Rinse BID  . metoprolol tartrate  12.5 mg Oral BID   Or  . metoprolol tartrate  12.5 mg Per Tube BID  . pantoprazole  40 mg Oral Daily  . rosuvastatin  40 mg Oral q1800  . sodium chloride flush  10-40 mL Intracatheter Q12H  . sodium chloride flush  3 mL Intravenous Q12H   Continuous Infusions: . sodium chloride Stopped (10/02/18 1031)  . sodium chloride Stopped (10/02/18 0505)  . sodium chloride 20 mL/hr at 10/01/18 1519  . lactated ringers 20 mL/hr at 10/01/18 2318  . lactated ringers 10 mL/hr at 10/04/18 0700  . nitroGLYCERIN    .  phenylephrine (NEO-SYNEPHRINE) Adult infusion Stopped (10/02/18 1128)   PRN Meds:.sodium chloride, levalbuterol, metoprolol tartrate, midazolam, morphine injection, ondansetron (ZOFRAN) IV, oxyCODONE, sodium chloride flush, sodium chloride flush, traMADol  Xrays Dg Chest Port 1 View  Result Date: 10/04/2018 CLINICAL DATA:  Status post CABG procedure. EXAM: PORTABLE CHEST 1 VIEW COMPARISON:  Chest radiograph 10/03/2018 FINDINGS: Monitoring leads overlie the patient. Stable cardiomegaly status post median sternotomy. Low lung volumes. Bilateral interstitial pulmonary opacities. Small bilateral pleural effusions with underlying heterogeneous opacities. Left IJ sheath stable in position. IMPRESSION: Low lung volumes, cardiomegaly, interstitial edema, small  bilateral effusions and atelectasis. Electronically Signed   By: Lovey Newcomer M.D.   On: 10/04/2018 08:14    Assessment/Plan: S/P Procedure(s) (LRB): CORONARY ARTERY BYPASS GRAFTING (CABG) TIMES  FOUR USING LEFT MAMMARY ARTERY AND RIGHT GREATER SAPHEANOUS VEIN HARVESTED ENDOSCOPICALLY (N/A) TRANSESOPHAGEAL ECHOCARDIOGRAM (TEE) (N/A)  1 conts with slow overall progress 2 hypertensive at times, creat improving trend, remains edematous. Will cont lasix 40 po BID and resume HCTZ. May need additional agents over time- cont to monitor 3 ABL anemia is stable, cont to monitor, 7.3/22.9 acceptable for now 4 BS control is improving, requires very high insulin doses( preop also) 5 CXR shows mod right effusion, ATX- push pulm toilet as able, cont to mobilize 6 d/c foley and central line 7 lovenox for DVT proph 8 no fevers or leukocytosis  LOS: 6 days    John Giovanni PA-C 10/05/2018 Pager [phone]  Patient seen and examined, agree with above Still does poorly with IS, bibasilar atelectasis on CXR. Does have some fluid on right but I think that is relatively minor  Remo Lipps C. Roxan Hockey, MD [facility] [phone]

## 2018-10-05 NOTE — Progress Notes (Signed)
Progress Note  Patient Name: Martin Ellers [facility] Date of Encounter: 10/05/2018  Primary Cardiologist: new, Dr. Claiborne Billings  Subjective   Day 6 s/p STEMI; day 4 s/p CABG: no recurrent chest pain  Inpatient Medications    Scheduled Meds: . acetaminophen  1,000 mg Oral Q6H   Or  . acetaminophen (TYLENOL) oral liquid 160 mg/5 mL  1,000 mg Per Tube Q6H  . aspirin EC  325 mg Oral Daily   Or  . aspirin  324 mg Per Tube Daily  . bisacodyl  10 mg Oral Daily   Or  . bisacodyl  10 mg Rectal Daily  . brimonidine  1 drop Left Eye BID  . Chlorhexidine Gluconate Cloth  6 each Topical Daily  . docusate sodium  200 mg Oral Daily  . dorzolamide  1 drop Left Eye TID  . enoxaparin (LOVENOX) injection  40 mg Subcutaneous QHS  . furosemide  40 mg Oral BID  . hydrochlorothiazide  25 mg Oral Daily  . insulin aspart  0-20 Units Subcutaneous TID WC  . insulin aspart  0-5 Units Subcutaneous QHS  . insulin aspart  25 Units Subcutaneous TID WC  . insulin detemir  75 Units Subcutaneous BID  . mouth rinse  15 mL Mouth Rinse BID  . metoprolol tartrate  12.5 mg Oral BID   Or  . metoprolol tartrate  12.5 mg Per Tube BID  . pantoprazole  40 mg Oral Daily  . potassium chloride  20 mEq Oral BID  . rosuvastatin  40 mg Oral q1800  . sodium chloride flush  10-40 mL Intracatheter Q12H  . sodium chloride flush  3 mL Intravenous Q12H   Continuous Infusions: . sodium chloride Stopped (10/02/18 1031)  . sodium chloride Stopped (10/02/18 0505)  . sodium chloride 20 mL/hr at 10/01/18 1519  . lactated ringers 20 mL/hr at 10/01/18 2318  . lactated ringers Stopped (10/04/18 1012)  . nitroGLYCERIN    . phenylephrine (NEO-SYNEPHRINE) Adult infusion Stopped (10/02/18 1128)   PRN Meds: sodium chloride, levalbuterol, metoprolol tartrate, midazolam, morphine injection, ondansetron (ZOFRAN) IV, oxyCODONE, sodium chloride flush, sodium chloride flush, traMADol   Vital Signs    Vitals:   10/05/18 0833 10/05/18 0900  10/05/18 1000 10/05/18 1013  BP:  (!) 153/70 (!) 170/76 (!) 179/79  Pulse:  86 85 90  Resp:  (!) 28 (!) 27   Temp: 98.4 F (36.9 C)     TempSrc: Oral     SpO2:  94% 100%   Weight:      Height:        Intake/Output Summary (Last 24 hours) at 10/05/2018 1019 Last data filed at 10/05/2018 0800 Gross per 24 hour  Intake 692 ml  Output 1230 ml  Net -538 ml    I/O since admission:  +2254  Filed Weights   10/03/18 0600 10/04/18 0500 10/05/18 0600  Weight: (!) 139.1 kg (!) 137.5 kg 135.1 kg    Telemetry    Sinus in the 70s - Personally Reviewed  ECG    10/02/2018 ECG (independently read by me): NSR 83; mild residual STE inferiorly  Will recheck ECG today  Physical Exam    BP (!) 179/79   Pulse 90   Temp 98.4 F (36.9 C) (Oral)   Resp (!) 27   Ht 6' (1.829 m)   Wt 135.1 kg   SpO2 100%   BMI 40.39 kg/m  General: Alert, oriented, no distress.  Skin: normal turgor, no rashes, warm and dry  HEENT: Normocephalic, atraumatic. Pupils equal round and reactive to light; sclera anicteric; extraocular muscles intact;** Nose without nasal septal hypertrophy Mouth/Parynx benign;  Neck: No JVD, no carotid bruits; normal carotid upstroke Lungs: Decreased BS at bases Chest wall: without tenderness to palpitation Heart: PMI not displaced, RRR, s1 s2 normal, 1/6 systolic murmur, no diastolic murmur, no rubs, gallops, thrills, or heaves Abdomen: Central adipositysoft, nontender; no hepatosplenomehaly, BS+; abdominal aorta nontender and not dilated by palpation. Back: no CVA tenderness Pulses 2+R radial site stable Musculoskeletal: full range of motion, normal strength, no joint deformities Extremities: no clubbing cyanosis or edema, Homan's sign negative  Neurologic: grossly nonfocal; Cranial nerves grossly wnl   Labs    Chemistry Recent Labs  Lab 09/29/18 1140  10/03/18 0430 10/04/18 0529 10/05/18 0405  NA 136   < > 134* 132* 135  K 5.0   < > 4.6 4.3 4.1  CL 104   < > 103  99 101  CO2 22   < > 23 24 27   GLUCOSE 256*   < > 234* 226* 85  BUN 18   < > 28* 31* 36*  CREATININE 1.18   < > 1.91* 1.48* 1.49*  CALCIUM 9.0   < > 7.5* 7.7* 7.7*  PROT 7.3  --   --   --   --   ALBUMIN 3.9  --   --   --   --   AST 32  --   --   --   --   ALT 22  --   --   --   --   ALKPHOS 51  --   --   --   --   BILITOT 1.6*  --   --   --   --   GFRNONAA >60   < > 34* 46* 46*  GFRAA >60   < > 39* 53* 53*  ANIONGAP 10   < > 8 9 7    < > = values in this interval not displayed.     Hematology Recent Labs  Lab 10/03/18 0430 10/04/18 0529 10/05/18 0405  WBC 12.4* 12.3* 10.4  RBC 2.41* 2.65* 2.44*  HGB 7.1* 7.9* 7.3*  HCT 22.5* 24.6* 22.9*  MCV 93.4 92.8 93.9  MCH 29.5 29.8 29.9  MCHC 31.6 32.1 31.9  RDW 15.2 15.2 15.2  PLT 196 207 232    Cardiac EnzymesNo results for input(s): TROPONINI in the last 168 hours.  Recent Labs  Lab 09/29/18 1158  TROPIPOC 0.00     BNPNo results for input(s): BNP, PROBNP in the last 168 hours.   DDimer No results for input(s): DDIMER in the last 168 hours.   Lipid Panel     Component Value Date/Time   CHOL 205 (H) 09/29/2018 1140   TRIG 195 (H) 09/29/2018 1140   HDL 27 (L) 09/29/2018 1140   CHOLHDL 7.6 09/29/2018 1140   VLDL 39 09/29/2018 1140   LDLCALC 139 (H) 09/29/2018 1140   LDLDIRECT 173.0 07/01/2018 1118     Radiology    Dg Chest Port 1 View  Result Date: 10/05/2018 CLINICAL DATA:  Status post coronary bypass grafting EXAM: PORTABLE CHEST 1 VIEW COMPARISON:  10/04/2018 FINDINGS: Cardiac shadow is prominent but stable. Postsurgical changes are noted. Left jugular central venous sheath is noted and stable. Small bilateral pleural effusions right greater than left are seen. Underlying atelectatic changes are present. No pneumothorax is seen. IMPRESSION: Small bilateral pleural effusions with basilar atelectasis right greater than left. Electronically  Signed   By: Inez Catalina M.D.   On: 10/05/2018 07:38   Dg Chest Port 1 View   Result Date: 10/04/2018 CLINICAL DATA:  Status post CABG procedure. EXAM: PORTABLE CHEST 1 VIEW COMPARISON:  Chest radiograph 10/03/2018 FINDINGS: Monitoring leads overlie the patient. Stable cardiomegaly status post median sternotomy. Low lung volumes. Bilateral interstitial pulmonary opacities. Small bilateral pleural effusions with underlying heterogeneous opacities. Left IJ sheath stable in position. IMPRESSION: Low lung volumes, cardiomegaly, interstitial edema, small bilateral effusions and atelectasis. Electronically Signed   By: Lovey Newcomer M.D.   On: 10/04/2018 08:14    Cardiac Studies   Cardiac cath 09/29/18:  Mid RCA lesion is 90% stenosed.  Ost RPDA lesion is 30% stenosed.  RPDA-1 lesion is 40% stenosed.  RPDA-2 lesion is 50% stenosed.  RPDA-3 lesion is 100% stenosed.  Ost LAD lesion is 95% stenosed.  Mid Cx to Dist Cx lesion is 90% stenosed.  Ost 1st Diag lesion is 60% stenosed.  Prox LAD to Mid LAD lesion is 40% stenosed.  Mid LAD lesion is 30% stenosed.  Ost LM to Mid LM lesion is 30% stenosed.  Acute inferior STEMI secondary to ulcerated plaque in the mid RCA with residual 90% stenosis with extensive thrombus but with brisk TIMI-3 flow and probable distal embolization to the apical portion of the PDA vessel. There is moderate stenoses in the PDA proximal to the distal embolization.  Significant concomitant CAD with 30% left main stenosis; 95% near ostial LAD stenosis, 60% diagonal stenosis with 40 and 30% diffuse mid LAD stenosis; normal high marginal ramus like vessel, with 90% diffuse stenosis in the proximal portion of a distal marginal branch prior to its bifurcation.  LVEDP 11 mm.  Transient development of tachycardia most likely paroxysmal atrial fibrillation with ventricular rate at 150 bpm which responded to metoprolol IV 2.5 mg x 2.  RECOMMENDATION: Angiograms were reviewed with Dr. Irish Lack as well as with Dr. Tharon Aquas Tright. Although the  culprit vessel is in the mid RCA which can be intervened upon, with the high-grade subtotal near ostial LAD stenosis in an area that may be difficult to stent due to potential jeopardy to a ramus-like vessel in the circumflex vessel in addition to the patient's concomitant CAD it was felt that the patient should undergo elective CABG revascularization following stabilization. As result he was started on Aggrastat bolus plus infusion and with plans to resume heparin. An echo Doppler study will be done to reevaluate LV function. He will be started on low-dose beta-blocker therapy, high potency statin therapy, with plans for CABG revascularization surgery later this week.  Echo 09/29/18: 1. The left ventricle has normal systolic function, with an ejection fraction of 55-60%. The cavity size was normal. There is mild concentric left ventricular hypertrophy. Left ventricular diastolic Doppler parameters are consistent with impaired  relaxation. Elevated mean left atrial pressure No evidence of left ventricular regional wall motion abnormalities. 2. Left atrial size was mildly dilated. 3. There is mild mitral annular calcification present. 4. The aortic valve is tricuspid. Mild thickening of the aortic valve. Mild calcification of the aortic valve. 5. The interatrial septum was not well visualized.  Patient Profile     73 y.o. male with a history of DM, HTN and presented with an inferior STEM on 09/29/2018 and was found to have thrombus in RCA with TIMI 3 flow and multivessel CAD felt to be best treated surgically with elective CABD.  Assessment & Plan    1. Day  6 s/p Inferior STEMI with Timi 3 flow and significant MVD. Treated with aggrastat and ultimate CABG on 6/4.  2. Day 4 CABG with LIMA to LAD, SVG to Dx, and SVG to PD and Y graft to PL.  3. Hypertersion:  Currently elevated at 179/79. On metoprolol 12.5 bid with PRN if BP elevated; consider increasing to 25 mg bid.  He is on Lasix 40 mg bid  and HCTZ was added by Dr. Roxan Hockey today. He was on valsartan PTA but on hold with recent renal insufficency.  4. Renal insufficiency:  Cr increased 1.91 on 6/6; today 1.49  5. BiBasilar atelectasis  6. DM on insulin  7. Hyperlipidemia: LDL 173; now on rosuvastatin 40 mg, was on livalo PTA.  Target LDL <70.  Signed, Troy Sine, MD, [facility] 10/05/2018, 10:19 AM

## 2018-10-05 NOTE — Progress Notes (Signed)
PT Cancellation Note  Patient Details Name: Martin Breighner [facility] MRN: [id number] DOB: 01-22-1946   Cancelled Treatment:    Reason Eval/Treat Not Completed: Medical issues which prohibited therapy.  Pt with RR in mid 30's, afib with RVR, slowly improving, but hold to tomorrow per RN. 10/05/2018  Donnella Sham, [place] [phone]  (pager) [phone]  (office)   Martin [name] 10/05/2018, 4:27 PM

## 2018-10-05 NOTE — Progress Notes (Signed)
Patient went into Afib RVR, confirmed by EKG, heart rate in the 170s. New orders obtained from Dr. Roxan Hockey.

## 2018-10-06 ENCOUNTER — Inpatient Hospital Stay (HOSPITAL_COMMUNITY): Payer: PPO

## 2018-10-06 ENCOUNTER — Encounter (HOSPITAL_COMMUNITY): Payer: Self-pay | Admitting: Student

## 2018-10-06 HISTORY — PX: IR THORACENTESIS ASP PLEURAL SPACE W/IMG GUIDE: IMG5380

## 2018-10-06 LAB — CBC
HCT: 25.9 % — ABNORMAL LOW (ref 39.0–52.0)
Hemoglobin: 8.1 g/dL — ABNORMAL LOW (ref 13.0–17.0)
MCH: 29.5 pg (ref 26.0–34.0)
MCHC: 31.3 g/dL (ref 30.0–36.0)
MCV: 94.2 fL (ref 80.0–100.0)
Platelets: 320 10*3/uL (ref 150–400)
RBC: 2.75 MIL/uL — ABNORMAL LOW (ref 4.22–5.81)
RDW: 15.1 % (ref 11.5–15.5)
WBC: 11.4 10*3/uL — ABNORMAL HIGH (ref 4.0–10.5)
nRBC: 0.2 % (ref 0.0–0.2)

## 2018-10-06 LAB — GLUCOSE, CAPILLARY
Glucose-Capillary: 87 mg/dL (ref 70–99)
Glucose-Capillary: 148 mg/dL — ABNORMAL HIGH (ref 70–99)
Glucose-Capillary: 259 mg/dL — ABNORMAL HIGH (ref 70–99)
Glucose-Capillary: 153 mg/dL — ABNORMAL HIGH (ref 70–99)

## 2018-10-06 LAB — BASIC METABOLIC PANEL
Anion gap: 10 (ref 5–15)
BUN: 41 mg/dL — ABNORMAL HIGH (ref 8–23)
CO2: 29 mmol/L (ref 22–32)
Calcium: 8.3 mg/dL — ABNORMAL LOW (ref 8.9–10.3)
Chloride: 96 mmol/L — ABNORMAL LOW (ref 98–111)
Creatinine, Ser: 1.77 mg/dL — ABNORMAL HIGH (ref 0.61–1.24)
GFR calc Af Amer: 43 mL/min — ABNORMAL LOW (ref >60)
GFR calc non Af Amer: 37 mL/min — ABNORMAL LOW (ref >60)
Glucose, Bld: 92 mg/dL (ref 70–99)
Potassium: 4.2 mmol/L (ref 3.5–5.1)
Sodium: 135 mmol/L (ref 135–145)

## 2018-10-06 LAB — SARS CORONAVIRUS 2: SARS Coronavirus 2: NOT DETECTED

## 2018-10-06 MED ORDER — FUROSEMIDE 40 MG PO TABS
40.0000 mg | ORAL_TABLET | Freq: Every day | ORAL | Status: DC
  Administered 2018-10-07: 40 mg via ORAL
  Filled 2018-10-06 (×2): qty 1

## 2018-10-06 MED ORDER — TAMSULOSIN HCL 0.4 MG PO CAPS
0.4000 mg | ORAL_CAPSULE | Freq: Every day | ORAL | Status: DC
  Administered 2018-10-06 – 2018-10-19 (×14): 0.4 mg via ORAL
  Filled 2018-10-06 (×15): qty 1

## 2018-10-06 MED ORDER — LIDOCAINE HCL 1 % IJ SOLN
INTRAMUSCULAR | Status: AC
  Filled 2018-10-06: qty 20

## 2018-10-06 MED ORDER — AMIODARONE HCL 200 MG PO TABS
400.0000 mg | ORAL_TABLET | Freq: Two times a day (BID) | ORAL | Status: DC
  Administered 2018-10-06 – 2018-10-12 (×14): 400 mg via ORAL
  Filled 2018-10-06 (×14): qty 2

## 2018-10-06 MED ORDER — LIDOCAINE HCL (PF) 1 % IJ SOLN
INTRAMUSCULAR | Status: DC | PRN
  Administered 2018-10-06: 10 mL

## 2018-10-06 MED ORDER — LACTULOSE 10 GM/15ML PO SOLN
20.0000 g | Freq: Once | ORAL | Status: AC
  Administered 2018-10-06: 20 g via ORAL
  Filled 2018-10-06: qty 30

## 2018-10-06 MED FILL — Heparin Sodium (Porcine) Inj 1000 Unit/ML: INTRAMUSCULAR | Qty: 10 | Status: AC

## 2018-10-06 MED FILL — Sodium Chloride IV Soln 0.9%: INTRAVENOUS | Qty: 4000 | Status: AC

## 2018-10-06 MED FILL — Lidocaine HCl Local Soln Prefilled Syringe 100 MG/5ML (2%): INTRAMUSCULAR | Qty: 5 | Status: AC

## 2018-10-06 MED FILL — Mannitol IV Soln 20%: INTRAVENOUS | Qty: 500 | Status: AC

## 2018-10-06 MED FILL — Electrolyte-R (PH 7.4) Solution: INTRAVENOUS | Qty: 4000 | Status: AC

## 2018-10-06 MED FILL — Sodium Bicarbonate IV Soln 8.4%: INTRAVENOUS | Qty: 50 | Status: AC

## 2018-10-06 NOTE — Progress Notes (Signed)
Physical Therapy Treatment Patient Details Name: Martin [name] [facility] Sr. MRN: [id number] DOB: [birth year] Today's Date: 10/06/2018    History of Present Illness 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  On 6/4, pt s/p CABG x3    PT Comments    Pt still needing encouragement.  Pt agreed to get OOB.  Emphasis on gait stability/stamina, transitions, education and sternal precautions.   Follow Up Recommendations  CIR;Other (comment)     Equipment Recommendations  Other (comment)(TBA)    Recommendations for Other Services Rehab consult     Precautions / Restrictions Precautions Precautions: Sternal    Mobility  Bed Mobility Overal bed mobility: Needs Assistance Bed Mobility: Supine to Sit     Supine to sit: Min assist     General bed mobility comments: cues for sternal precaution, minor assist  Transfers Overall transfer level: Needs assistance   Transfers: Sit to/from Stand Sit to Stand: Min assist         General transfer comment: cues for sternal precautions, minimal assist to come forward.  Ambulation/Gait Ambulation/Gait assistance: Min Web designer (Feet): 70 Feet Assistive device: Straight cane;IV Pole Gait Pattern/deviations: Step-through pattern   Gait velocity interpretation: <1.31 ft/sec, indicative of household ambulator General Gait Details: slow, guarded, short steps   Stairs             Wheelchair Mobility    Modified Rankin (Stroke Patients Only)       Balance Overall balance assessment: Needs assistance Sitting-balance support: No upper extremity supported Sitting balance-Leahy Scale: Fair     Standing balance support: Single extremity supported;No upper extremity supported Standing balance-Leahy Scale: Poor Standing balance comment: asked for assist, preferred cane                             Cognition Arousal/Alertness: Awake/alert Behavior During Therapy: Flat affect;Anxious Overall Cognitive Status: Within Functional Limits for tasks assessed                                        Exercises Other Exercises Other Exercises: warm up hip/knee flex/ext ROM x 10 reps bil    General Comments General comments (skin integrity, edema, etc.): vss  bp 129/69 (78),  sats in the upper 90's on O2,   EHR in the 100's      Pertinent Vitals/Pain Pain Assessment: Faces Faces Pain Scale: Hurts little more Pain Location: chest and overall Pain Descriptors / Indicators: Sore Pain Intervention(s): Monitored during session    Home Living Family/patient expects to be discharged to:: Private residence Living Arrangements: Spouse/significant other Available Help at Discharge: Family;Available 24 hours/day Type of Home: House Home Access: Stairs to enter Entrance Stairs-Rails: Right;Left Home Layout: One level Home Equipment: Marine scientist - quad      Prior Function Level of Independence: Independent with assistive device(s)          PT Goals (current goals can now be found in the care plan section) Acute Rehab PT Goals Patient Stated Goal: Get my Independence back PT Goal Formulation: With patient Time For Goal Achievement: 10/20/18 Potential to Achieve Goals: Good Progress towards PT goals: Progressing toward goals    Frequency    Min 3X/week  PT Plan Current plan remains appropriate    Co-evaluation              AM-PAC PT "6 Clicks" Mobility   Outcome Measure  Help needed turning from your back to your side while in a flat bed without using bedrails?: A Little Help needed moving from lying on your back to sitting on the side of a flat bed without using bedrails?: A Little Help needed moving to and from a bed to a chair (including a wheelchair)?: A Little Help needed standing up from a chair using your arms (e.g., wheelchair or  bedside chair)?: A Little Help needed to walk in hospital room?: A Little Help needed climbing 3-5 steps with a railing? : A Lot 6 Click Score: 17    End of Session   Activity Tolerance: Patient tolerated treatment well;Other (comment) Patient left: in chair;with call bell/phone within reach Nurse Communication: Mobility status PT Visit Diagnosis: Other abnormalities of gait and mobility (R26.89);Muscle weakness (generalized) (M62.81);Pain Pain - part of body: (sternum)     Time: 8110-3159 PT Time Calculation (min) (ACUTE ONLY): 26 min  Charges:  $Gait Training: 8-22 mins $Therapeutic Activity: 8-22 mins                     10/06/2018  Martin [name], PT Acute Rehabilitation Services [phone]  (pager) [phone]  (office)   Martin [name] 10/06/2018, 7:18 PM

## 2018-10-06 NOTE — Progress Notes (Addendum)
[address]       [place] [address]             [phone]      5 Days Post-Op Procedure(s) (LRB): CORONARY ARTERY BYPASS GRAFTING (CABG) TIMES  FOUR USING LEFT MAMMARY ARTERY AND RIGHT GREATER SAPHEANOUS VEIN HARVESTED ENDOSCOPICALLY (N/A) TRANSESOPHAGEAL ECHOCARDIOGRAM (TEE) (N/A) Subjective: Now in sinus rhythm Had to be straight cathed twice for inability to void, some blood clots   Objective: Vital signs in last 24 hours: Temp:  [97.9 F (36.6 C)-98.4 F (36.9 C)] 97.9 F (36.6 C) (06/09 0400) Pulse Rate:  [60-137] 66 (06/09 0700) Cardiac Rhythm: Normal sinus rhythm (06/09 0400) Resp:  [20-36] 27 (06/09 0700) BP: (112-179)/(58-84) 148/69 (06/09 0700) SpO2:  [94 %-100 %] 98 % (06/09 0700) Weight:  [129.9 kg] 129.9 kg (06/09 0500)  Hemodynamic parameters for last 24 hours:    Intake/Output from previous day: 06/08 0701 - 06/09 0700 In: 770.5 [P.O.:360; I.V.:410.5] Out: 1352 [Urine:1352] Intake/Output this shift: No intake/output data recorded.  General appearance: alert, cooperative, distracted, fatigued and no distress Heart: regular rate and rhythm Lungs: fair air exchange, doesn't take deep breaths, dim right>left base Abdomen: soft, nontender, obese Extremities: some improvement in edema Wound: some serous chest drainage  Lab Results: Recent Labs    10/05/18 0405 10/06/18 0602  WBC 10.4 11.4*  HGB 7.3* 8.1*  HCT 22.9* 25.9*  PLT 232 320   BMET:  Recent Labs    10/05/18 0405 10/06/18 0602  NA 135 135  K 4.1 4.2  CL 101 96*  CO2 27 29  GLUCOSE 85 92  BUN 36* 41*  CREATININE 1.49* 1.77*  CALCIUM 7.7* 8.3*    PT/INR: No results for input(s): LABPROT, INR in the last 72 hours. ABG    Component Value Date/Time   PHART 7.343 (L) 10/01/2018 1946   HCO3 21.5 10/01/2018 1946   TCO2 22 10/01/2018 2009   ACIDBASEDEF 4.0 (H) 10/01/2018 1946   O2SAT 95.0 10/01/2018 1946   CBG (last 3)  Recent Labs    10/05/18 1656  10/05/18 2147 10/06/18 0640  GLUCAP 149* 162* 87    Meds Scheduled Meds:  acetaminophen  1,000 mg Oral Q6H   Or   acetaminophen (TYLENOL) oral liquid 160 mg/5 mL  1,000 mg Per Tube Q6H   aspirin EC  325 mg Oral Daily   Or   aspirin  324 mg Per Tube Daily   bisacodyl  10 mg Oral Daily   Or   bisacodyl  10 mg Rectal Daily   brimonidine  1 drop Left Eye BID   Chlorhexidine Gluconate Cloth  6 each Topical Daily   docusate sodium  200 mg Oral Daily   dorzolamide  1 drop Left Eye TID   enoxaparin (LOVENOX) injection  40 mg Subcutaneous QHS   furosemide  40 mg Oral BID   hydrochlorothiazide  25 mg Oral Daily   insulin aspart  0-20 Units Subcutaneous TID WC   insulin aspart  0-5 Units Subcutaneous QHS   insulin aspart  25 Units Subcutaneous TID WC   insulin detemir  75 Units Subcutaneous BID   mouth rinse  15 mL Mouth Rinse BID   metoprolol tartrate  12.5 mg Oral BID   Or   metoprolol tartrate  12.5 mg Per Tube BID   pantoprazole  40 mg Oral Daily   potassium chloride  20 mEq Oral BID   rosuvastatin  40 mg Oral q1800  sodium chloride flush  10-40 mL Intracatheter Q12H   sodium chloride flush  3 mL Intravenous Q12H   Continuous Infusions:  sodium chloride Stopped (10/02/18 1031)   sodium chloride Stopped (10/02/18 0505)   sodium chloride 20 mL/hr at 10/01/18 1519   amiodarone 30 mg/hr (10/06/18 0432)   lactated ringers 20 mL/hr at 10/01/18 2318   lactated ringers Stopped (10/06/18 0125)   nitroGLYCERIN     phenylephrine (NEO-SYNEPHRINE) Adult infusion Stopped (10/02/18 1128)   PRN Meds:.sodium chloride, levalbuterol, metoprolol tartrate, midazolam, morphine injection, ondansetron (ZOFRAN) IV, oxyCODONE, sodium chloride flush, sodium chloride flush, traMADol  Xrays Dg Chest Port 1 View  Result Date: 10/05/2018 CLINICAL DATA:  Status post coronary bypass grafting EXAM: PORTABLE CHEST 1 VIEW COMPARISON:  10/04/2018 FINDINGS: Cardiac shadow is  prominent but stable. Postsurgical changes are noted. Left jugular central venous sheath is noted and stable. Small bilateral pleural effusions right greater than left are seen. Underlying atelectatic changes are present. No pneumothorax is seen. IMPRESSION: Small bilateral pleural effusions with basilar atelectasis right greater than left. Electronically Signed   By: Inez Catalina M.D.   On: 10/05/2018 07:38    Assessment/Plan: S/P Procedure(s) (LRB): CORONARY ARTERY BYPASS GRAFTING (CABG) TIMES  FOUR USING LEFT MAMMARY ARTERY AND RIGHT GREATER SAPHEANOUS VEIN HARVESTED ENDOSCOPICALLY (N/A) TRANSESOPHAGEAL ECHOCARDIOGRAM (TEE) (N/A)  1 Afib, on amio gtt, currently in SR, most recent K+/Mg++ levels normal 2 urine retention, will add flomax , may need to replace catheter temporarily Creat increased from 1.4 to 1.7, may be a little intravasc dry. Most recent albumin on  6/2 normal. If weight is accurate he is below preop weight. Will reduce lasix to once daily 3 BP a little high at times but improved- cont current meds for now 4 BS control pretty good, monitor closely as he isn't eating well 5 ABL anemia is improved, cont to monitor, not in transfusion zone currently 6 minor leukocytosis, no fevers, monitor wounds carefully, some drainage from chest incision, bone is stable 7 right pleural effusion fairly stable, monitor as may need thoracentesis 8 wean O2 as able, still poor inspiratory effort/atx- cont to push pulm toilet 9 cont cardiac rehab/PT 10 lovenox for DVTproph    . LOS: 7 days    John Giovanni PA-C 10/06/2018 Pager [phone]  Patient seen and examined, agree with above He looks better today Hard to tell from CXR how large right pleural effusion is but will do US guided thoracentesis to see if that helps with respiratory status Convert amiodarone to PO  Niurka Benecke C. Roxan Hockey, MD [facility] [phone]

## 2018-10-06 NOTE — Evaluation (Signed)
Physical Therapy Evaluation Patient Details Name: Martin Hupfer [facility] Sr. MRN: [id number] DOB: 1945/10/04 Today's Date: 10/06/2018   History of Present Illness  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  On 6/4, pt s/p CABG x3  Clinical Impression  Pt admitted with/for STEMI and proceeded to CABG.  This was a limited evaluation.  Pt deferred mobility.  Will try back later today.  Pt currently limited functionally due to the problems listed. ( See problems list.)   Pt will benefit from PT to maximize function and safety in order to get ready for next venue listed below.     Follow Up Recommendations CIR;Other (comment)(may progress to HHPT)    Equipment Recommendations  Other (comment)(TBA)    Recommendations for Other Services Rehab consult     Precautions / Restrictions Precautions Precautions: Sternal      Mobility  Bed Mobility               General bed mobility comments: pt deferred mobility this morning due to too tired.  Transfers                    Ambulation/Gait                Stairs            Wheelchair Mobility    Modified Rankin (Stroke Patients Only)       Balance                                             Pertinent Vitals/Pain Pain Assessment: Faces Faces Pain Scale: Hurts even more Pain Location: chest and overall Pain Descriptors / Indicators: Sore Pain Intervention(s): Monitored during session    Home Living Family/patient expects to be discharged to:: Private residence Living Arrangements: Spouse/significant other Available Help at Discharge: Family;Available 24 hours/day Type of Home: House Home Access: Stairs to enter Entrance Stairs-Rails: Psychiatric nurse of Steps: 3 Home Layout: One level Home Equipment: Marine scientist - quad      Prior Function  Level of Independence: Independent with assistive device(s)               Hand Dominance        Extremity/Trunk Assessment   Upper Extremity Assessment Upper Extremity Assessment: Generalized weakness    Lower Extremity Assessment Lower Extremity Assessment: Generalized weakness       Communication   Communication: No difficulties  Cognition Arousal/Alertness: Awake/alert Behavior During Therapy: Flat affect;Anxious Overall Cognitive Status: Within Functional Limits for tasks assessed                                        General Comments General comments (skin integrity, edema, etc.): vss    Exercises Other Exercises Other Exercises: warm up hip/knee flex/ext ROM x 10 reps bil   Assessment/Plan    PT Assessment Patient needs continued PT services  PT Problem List Decreased strength;Decreased activity tolerance;Decreased mobility;Decreased knowledge of use of DME;Cardiopulmonary status limiting activity;Pain       PT Treatment Interventions Gait training;Functional mobility training;Therapeutic activities;Balance training;Patient/family education    PT Goals (Current goals can be  found in the Care Plan section)  Acute Rehab PT Goals Patient Stated Goal: Get my Independence back PT Goal Formulation: With patient Time For Goal Achievement: 10/20/18 Potential to Achieve Goals: Good    Frequency Min 3X/week   Barriers to discharge        Co-evaluation               AM-PAC PT "6 Clicks" Mobility  Outcome Measure Help needed turning from your back to your side while in a flat bed without using bedrails?: A Lot Help needed moving from lying on your back to sitting on the side of a flat bed without using bedrails?: A Lot Help needed moving to and from a bed to a chair (including a wheelchair)?: A Lot Help needed standing up from a chair using your arms (e.g., wheelchair or bedside chair)?: A Lot Help needed to walk in hospital room?:  A Lot Help needed climbing 3-5 steps with a railing? : A Lot 6 Click Score: 12    End of Session   Activity Tolerance: Patient tolerated treatment well;Other (comment)(pt deferred mobility) Patient left: in bed;with call bell/phone within reach;with bed alarm set Nurse Communication: Mobility status PT Visit Diagnosis: Other abnormalities of gait and mobility (R26.89);Muscle weakness (generalized) (M62.81);Pain Pain - part of body: (sternum)    Time: 0258-5277 PT Time Calculation (min) (ACUTE ONLY): 12 min   Charges:   PT Evaluation $PT Eval Moderate Complexity: 1 Mod          10/06/2018  Donnella Sham, PT Acute Rehabilitation Services [phone]  (pager) [phone]  (office)  Tessie Fass [name] 10/06/2018, 6:57 PM

## 2018-10-06 NOTE — Progress Notes (Signed)
RN kaylee aware of dc central line order

## 2018-10-06 NOTE — Procedures (Signed)
PROCEDURE SUMMARY:  Successful image-guided left thoracentesis. Yielded 600 milliliters of dark red fluid. Patient tolerated procedure well. No immediate complications. EBL = 0 mL.  Specimen was not sent for labs. CXR ordered.  Jaryd Drew PA-C 10/06/2018 2:30 PM

## 2018-10-06 NOTE — Progress Notes (Signed)
Patient ID: Martin Agee Sr., male   DOB: 12-01-1945, 73 y.o.   MRN: [id number] TCTS Evening Rounds:  Hemodynamically stable in sinus rhythm on oral amio.  sats 99%  Good urine output.  Had left thoracentesis in IR today removing 600 cc.  Ambulated a little.

## 2018-10-07 ENCOUNTER — Other Ambulatory Visit: Payer: Self-pay | Admitting: Endocrinology

## 2018-10-07 ENCOUNTER — Inpatient Hospital Stay (HOSPITAL_COMMUNITY): Payer: PPO

## 2018-10-07 LAB — COMPREHENSIVE METABOLIC PANEL
ALT: 58 U/L — ABNORMAL HIGH (ref 0–44)
AST: 55 U/L — ABNORMAL HIGH (ref 15–41)
Albumin: 3.2 g/dL — ABNORMAL LOW (ref 3.5–5.0)
Alkaline Phosphatase: 120 U/L (ref 38–126)
Anion gap: 14 (ref 5–15)
BUN: 42 mg/dL — ABNORMAL HIGH (ref 8–23)
CO2: 28 mmol/L (ref 22–32)
Calcium: 8.9 mg/dL (ref 8.9–10.3)
Chloride: 95 mmol/L — ABNORMAL LOW (ref 98–111)
Creatinine, Ser: 1.89 mg/dL — ABNORMAL HIGH (ref 0.61–1.24)
GFR calc Af Amer: 40 mL/min — ABNORMAL LOW (ref >60)
GFR calc non Af Amer: 34 mL/min — ABNORMAL LOW (ref >60)
Glucose, Bld: 56 mg/dL — ABNORMAL LOW (ref 70–99)
Potassium: 4.5 mmol/L (ref 3.5–5.1)
Sodium: 137 mmol/L (ref 135–145)
Total Bilirubin: 1.1 mg/dL (ref 0.3–1.2)
Total Protein: 7.2 g/dL (ref 6.5–8.1)

## 2018-10-07 LAB — CBC
HCT: 26.7 % — ABNORMAL LOW (ref 39.0–52.0)
Hemoglobin: 8.9 g/dL — ABNORMAL LOW (ref 13.0–17.0)
MCH: 30.2 pg (ref 26.0–34.0)
MCHC: 33.3 g/dL (ref 30.0–36.0)
MCV: 90.5 fL (ref 80.0–100.0)
Platelets: 384 10*3/uL (ref 150–400)
RBC: 2.95 MIL/uL — ABNORMAL LOW (ref 4.22–5.81)
RDW: 14.7 % (ref 11.5–15.5)
WBC: 13.2 10*3/uL — ABNORMAL HIGH (ref 4.0–10.5)
nRBC: 0.2 % (ref 0.0–0.2)

## 2018-10-07 LAB — MAGNESIUM: Magnesium: 2.8 mg/dL — ABNORMAL HIGH (ref 1.7–2.4)

## 2018-10-07 LAB — GLUCOSE, CAPILLARY
Glucose-Capillary: 50 mg/dL — ABNORMAL LOW (ref 70–99)
Glucose-Capillary: 61 mg/dL — ABNORMAL LOW (ref 70–99)
Glucose-Capillary: 85 mg/dL (ref 70–99)
Glucose-Capillary: 117 mg/dL — ABNORMAL HIGH (ref 70–99)
Glucose-Capillary: 132 mg/dL — ABNORMAL HIGH (ref 70–99)
Glucose-Capillary: 125 mg/dL — ABNORMAL HIGH (ref 70–99)

## 2018-10-07 MED ORDER — MOVING RIGHT ALONG BOOK
Freq: Once | Status: AC
  Administered 2018-10-07: 17:00:00
  Filled 2018-10-07: qty 1

## 2018-10-07 MED ORDER — ALUM & MAG HYDROXIDE-SIMETH 200-200-20 MG/5ML PO SUSP: 15.0000 mL | Freq: Four times a day (QID) | ORAL | Status: DC | PRN

## 2018-10-07 MED ORDER — LACTULOSE 10 GM/15ML PO SOLN
20.0000 g | Freq: Every day | ORAL | Status: DC | PRN
  Administered 2018-10-13: 20 g via ORAL
  Filled 2018-10-07 (×2): qty 30

## 2018-10-07 MED ORDER — INSULIN DETEMIR 100 UNIT/ML ~~LOC~~ SOLN
70.0000 [IU] | Freq: Two times a day (BID) | SUBCUTANEOUS | Status: DC
  Administered 2018-10-07: 70 [IU] via SUBCUTANEOUS
  Filled 2018-10-07 (×5): qty 0.7

## 2018-10-07 MED ORDER — SODIUM CHLORIDE 0.9 % IV SOLN: 250.0000 mL | INTRAVENOUS | Status: DC | PRN

## 2018-10-07 MED ORDER — SODIUM CHLORIDE 0.9% FLUSH
3.0000 mL | INTRAVENOUS | Status: DC | PRN
  Administered 2018-10-08: 3 mL via INTRAVENOUS
  Filled 2018-10-07: qty 3

## 2018-10-07 MED ORDER — POLYETHYLENE GLYCOL 3350 17 G PO PACK: 17.0000 g | PACK | Freq: Every day | ORAL | Status: DC | PRN

## 2018-10-07 MED ORDER — SODIUM CHLORIDE 0.9% FLUSH
3.0000 mL | Freq: Two times a day (BID) | INTRAVENOUS | Status: DC
  Administered 2018-10-07 – 2018-10-19 (×23): 3 mL via INTRAVENOUS

## 2018-10-07 NOTE — Progress Notes (Addendum)
[address]       [place] [address]             [phone]      6 Days Post-Op Procedure(s) (LRB): CORONARY ARTERY BYPASS GRAFTING (CABG) TIMES  FOUR USING LEFT MAMMARY ARTERY AND RIGHT GREATER SAPHEANOUS VEIN HARVESTED ENDOSCOPICALLY (N/A) TRANSESOPHAGEAL ECHOCARDIOGRAM (TEE) (N/A) Subjective: Feels a little stronger, has poor appetite/some nausea Low BS this am  Objective: Vital signs in last 24 hours: Temp:  [97.9 F (36.6 C)-98.2 F (36.8 C)] 98 F (36.7 C) (06/10 0400) Pulse Rate:  [66-79] 66 (06/10 0600) Cardiac Rhythm: Normal sinus rhythm (06/09 2000) Resp:  [17-32] 17 (06/10 0600) BP: (109-172)/(51-80) 124/66 (06/10 0600) SpO2:  [97 %-100 %] 100 % (06/10 0600) Weight:  [134.2 kg] 134.2 kg (06/10 0600)  Hemodynamic parameters for last 24 hours:    Intake/Output from previous day: 06/09 0701 - 06/10 0700 In: 873 [P.O.:820; I.V.:53] Out: 2150 [Urine:1550] Intake/Output this shift: Total I/O In: 123 [P.O.:120; I.V.:3] Out: 450 [Urine:450]  General appearance: alert, cooperative and no distress Heart: regular rate and rhythm Lungs: diminished, esp in bases Abdomen: obese, soft, nontender Extremities: + periph edema, improved Wound: incis healing well  Lab Results: Recent Labs    10/05/18 0405 10/06/18 0602  WBC 10.4 11.4*  HGB 7.3* 8.1*  HCT 22.9* 25.9*  PLT 232 320   BMET:  Recent Labs    10/05/18 0405 10/06/18 0602  NA 135 135  K 4.1 4.2  CL 101 96*  CO2 27 29  GLUCOSE 85 92  BUN 36* 41*  CREATININE 1.49* 1.77*  CALCIUM 7.7* 8.3*    PT/INR: No results for input(s): LABPROT, INR in the last 72 hours. ABG    Component Value Date/Time   PHART 7.343 (L) 10/01/2018 1946   HCO3 21.5 10/01/2018 1946   TCO2 22 10/01/2018 2009   ACIDBASEDEF 4.0 (H) 10/01/2018 1946   O2SAT 95.0 10/01/2018 1946   CBG (last 3)  Recent Labs    10/06/18 1108 10/06/18 1530 10/06/18 2134  GLUCAP 148* 259* 153*    Meds Scheduled  Meds:  amiodarone  400 mg Oral BID   aspirin EC  325 mg Oral Daily   Or   aspirin  324 mg Per Tube Daily   bisacodyl  10 mg Oral Daily   Or   bisacodyl  10 mg Rectal Daily   brimonidine  1 drop Left Eye BID   Chlorhexidine Gluconate Cloth  6 each Topical Daily   docusate sodium  200 mg Oral Daily   dorzolamide  1 drop Left Eye TID   enoxaparin (LOVENOX) injection  40 mg Subcutaneous QHS   furosemide  40 mg Oral Daily   hydrochlorothiazide  25 mg Oral Daily   insulin aspart  0-20 Units Subcutaneous TID WC   insulin aspart  0-5 Units Subcutaneous QHS   insulin aspart  25 Units Subcutaneous TID WC   insulin detemir  75 Units Subcutaneous BID   mouth rinse  15 mL Mouth Rinse BID   metoprolol tartrate  12.5 mg Oral BID   Or   metoprolol tartrate  12.5 mg Per Tube BID   pantoprazole  40 mg Oral Daily   potassium chloride  20 mEq Oral BID   rosuvastatin  40 mg Oral q1800   sodium chloride flush  3 mL Intravenous Q12H   tamsulosin  0.4 mg Oral Daily   Continuous Infusions:  sodium chloride Stopped (10/02/18 0505)  lactated ringers 20 mL/hr at 10/01/18 2318   lactated ringers Stopped (10/06/18 1039)   nitroGLYCERIN     phenylephrine (NEO-SYNEPHRINE) Adult infusion Stopped (10/02/18 1128)   PRN Meds:.levalbuterol, lidocaine (PF), metoprolol tartrate, midazolam, morphine injection, ondansetron (ZOFRAN) IV, oxyCODONE, sodium chloride flush, traMADol  Xrays Dg Chest 1 View  Result Date: 10/06/2018 CLINICAL DATA:  Status post left thoracentesis. EXAM: CHEST  1 VIEW COMPARISON:  Chest x-ray from yesterday. FINDINGS: Stable cardiomediastinal silhouette status post CABG. Persistent low lung volumes with small bilateral pleural effusions and bibasilar atelectasis. No pneumothorax. No acute osseous abnormality. IMPRESSION: 1. Unchanged small bilateral pleural effusions and bibasilar atelectasis. No pneumothorax. Electronically Signed   By: Titus Dubin M.D.   On:  10/06/2018 14:51   Dg Chest Port 1 View  Result Date: 10/06/2018 CLINICAL DATA:  Sore chest post cardiac surgery, diabetes mellitus, hypertension EXAM: PORTABLE CHEST 1 VIEW COMPARISON:  Portable exam 0510 hours compared to 10/05/2018 FINDINGS: Enlargement of cardiac silhouette post CABG. Mediastinal contours prominent but stable, presumed post cardiac surgery. Bibasilar atelectasis and pleural effusions RIGHT greater than LEFT, little changed. Accentuated perihilar markings stable. No pneumothorax or acute osseous findings. IMPRESSION: Bibasilar effusions and atelectasis RIGHT greater than LEFT. Enlargement of cardiac silhouette post CABG. Electronically Signed   By: Lavonia Dana M.D.   On: 10/06/2018 08:02   Ir Thoracentesis Asp Pleural Space W/img Guide  Result Date: 10/06/2018 INDICATION: Patient with history of MI s/p CABG x3 10/02/2018 by Dr. Koleen Nimrod, dyspnea, and bilateral pleural effusions. Request is made for therapeutic left thoracentesis. EXAM: ULTRASOUND GUIDED THERAPEUTIC LEFT THORACENTESIS MEDICATIONS: 10 mL 1% lidocaine COMPLICATIONS: None immediate. PROCEDURE: An ultrasound guided thoracentesis was thoroughly discussed with the patient and questions answered. The benefits, risks, alternatives and complications were also discussed. The patient understands and wishes to proceed with the procedure. Written consent was obtained. Ultrasound was performed to localize and mark an adequate pocket of fluid in the left chest. The area was then prepped and draped in the normal sterile fashion. 1% Lidocaine was used for local anesthesia. Under ultrasound guidance a 6 Fr Safe-T-Centesis catheter was introduced. Thoracentesis was performed. The catheter was removed and a dressing applied. FINDINGS: A total of approximately 600 mL of dark red fluid was removed. IMPRESSION: Successful ultrasound guided left thoracentesis yielding 600 mL of pleural fluid. Read by: Earley Abide, PA-C Electronically Signed    By: [name].  Shick M.D.   On: 10/06/2018 14:55    Assessment/Plan: S/P Procedure(s) (LRB): CORONARY ARTERY BYPASS GRAFTING (CABG) TIMES  FOUR USING LEFT MAMMARY ARTERY AND RIGHT GREATER SAPHEANOUS VEIN HARVESTED ENDOSCOPICALLY (N/A) TRANSESOPHAGEAL ECHOCARDIOGRAM (TEE) (N/A)  1 hemodyn stable in sinus rhythm, on po amio, may be contributing to nausea. Cont to monitor 2 600 ml from thoracentesis, CXR looks about same with R>L effus and atelectasis. sats good on 4.5 l Nicholson- cont to push IS/Flutter valve 3 morning labs currently pending 4 BS variable, will be hard to control with poor intake and appetite stabilize. Cont to monitor closely on current RX, in 50's this am  5 poss CIR at discharge depending on progress, poss transfer from ICU soon- push rehab and PT as able 6 urination improved with flomax, did not need catheter replaced, uncertain if weight is accurate, cont current HCTZ/lasix dosing until we have BUN/Creat back from lab    LOS: 8 days    John Giovanni PA-C 10/07/2018 Pager [phone]  Patient seen and examined, agree with above Still has significant atelectasis on CXR,  lungs sound a little better on exam Ambulating still a challenge but is able to do with assist Hypoglycemia- will decrease levemir and meal coverage, stop HS coverage Frequent small volume voids- bladder scan does not show any residual transfer to 4 E   Tevis Dunavan C. Roxan Hockey, MD [facility] [phone]

## 2018-10-07 NOTE — Progress Notes (Signed)
Pt received from [facility]. VSS. CHG complete. Pt oriented to room and unit. Family updated of pt transfer. Will continue to monitor.  Clyde Canterbury, RN

## 2018-10-08 ENCOUNTER — Inpatient Hospital Stay (HOSPITAL_COMMUNITY): Payer: PPO

## 2018-10-08 LAB — URINALYSIS, ROUTINE W REFLEX MICROSCOPIC
Bilirubin Urine: NEGATIVE
Glucose, UA: NEGATIVE mg/dL
Ketones, ur: NEGATIVE mg/dL
Leukocytes,Ua: NEGATIVE
Nitrite: NEGATIVE
Protein, ur: 100 mg/dL — AB
Specific Gravity, Urine: 1.02 (ref 1.005–1.030)
pH: 6 (ref 5.0–8.0)

## 2018-10-08 LAB — GLUCOSE, CAPILLARY
Glucose-Capillary: 112 mg/dL — ABNORMAL HIGH (ref 70–99)
Glucose-Capillary: 64 mg/dL — ABNORMAL LOW (ref 70–99)
Glucose-Capillary: 95 mg/dL (ref 70–99)
Glucose-Capillary: 148 mg/dL — ABNORMAL HIGH (ref 70–99)
Glucose-Capillary: 153 mg/dL — ABNORMAL HIGH (ref 70–99)
Glucose-Capillary: 196 mg/dL — ABNORMAL HIGH (ref 70–99)

## 2018-10-08 LAB — BASIC METABOLIC PANEL
Anion gap: 10 (ref 5–15)
BUN: 40 mg/dL — ABNORMAL HIGH (ref 8–23)
CO2: 30 mmol/L (ref 22–32)
Calcium: 8.3 mg/dL — ABNORMAL LOW (ref 8.9–10.3)
Chloride: 93 mmol/L — ABNORMAL LOW (ref 98–111)
Creatinine, Ser: 2.11 mg/dL — ABNORMAL HIGH (ref 0.61–1.24)
GFR calc Af Amer: 35 mL/min — ABNORMAL LOW (ref >60)
GFR calc non Af Amer: 30 mL/min — ABNORMAL LOW (ref >60)
Glucose, Bld: 91 mg/dL (ref 70–99)
Potassium: 4.1 mmol/L (ref 3.5–5.1)
Sodium: 133 mmol/L — ABNORMAL LOW (ref 135–145)

## 2018-10-08 LAB — CBC
HCT: 24.3 % — ABNORMAL LOW (ref 39.0–52.0)
Hemoglobin: 7.7 g/dL — ABNORMAL LOW (ref 13.0–17.0)
MCH: 29.2 pg (ref 26.0–34.0)
MCHC: 31.7 g/dL (ref 30.0–36.0)
MCV: 92 fL (ref 80.0–100.0)
Platelets: 335 10*3/uL (ref 150–400)
RBC: 2.64 MIL/uL — ABNORMAL LOW (ref 4.22–5.81)
RDW: 14.6 % (ref 11.5–15.5)
WBC: 10.5 10*3/uL (ref 4.0–10.5)
nRBC: 0.3 % — ABNORMAL HIGH (ref 0.0–0.2)

## 2018-10-08 MED ORDER — ALBUTEROL SULFATE (2.5 MG/3ML) 0.083% IN NEBU
2.5000 mg | INHALATION_SOLUTION | Freq: Once | RESPIRATORY_TRACT | Status: AC
  Administered 2018-10-08: 2.5 mg via RESPIRATORY_TRACT
  Filled 2018-10-08: qty 3

## 2018-10-08 MED ORDER — INSULIN DETEMIR 100 UNIT/ML ~~LOC~~ SOLN
50.0000 [IU] | Freq: Two times a day (BID) | SUBCUTANEOUS | Status: DC
  Administered 2018-10-08 – 2018-10-09 (×2): 50 [IU] via SUBCUTANEOUS
  Filled 2018-10-08 (×6): qty 0.5

## 2018-10-08 NOTE — Progress Notes (Signed)
CARDIAC REHAB PHASE I   PRE:  Rate/Rhythm: 60 SR  BP:  Sitting: 123/68      SaO2: 99 2L  MODE:  Ambulation: 50 ft in room   POST:  Rate/Rhythm: 72 SR  BP:  Sitting: 95/69    SaO2: 98 2L   Pt ambulated 49ft in room, assist of two with gait belt and front wheel walker. Pt requires a lot of motivation to get up. During ambulation, pt c/o feeling dizzy and weak. Pt assisted to recliner, RN aware of BP. Encouraged IS use, pt currently getting to 500. Pt strongly encouraged to sit in chair, and work with PT later today. Will continue to follow and encourage ambulation.   [phone] Rufina Falco, RN BSN 10/08/2018 10:17 AM

## 2018-10-08 NOTE — Progress Notes (Signed)
Physical Therapy Treatment Patient Details Name: Martin [name] [facility] MRN: [id number] DOB: 1946-03-30 Today's Date: 10/08/2018    History of Present Illness 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  On 6/4, pt s/p CABG x3    PT Comments    Pt needs lots of encouragement.  Very flat of affect.  Emphasis on safety instruction, bed mobility without heavy use of UE's and progressive gait.    Follow Up Recommendations  CIR;Other (comment)     Equipment Recommendations  Other (comment)(TBA)    Recommendations for Other Services       Precautions / Restrictions Precautions Precautions: Sternal    Mobility  Bed Mobility Overal bed mobility: Needs Assistance Bed Mobility: Supine to Sit     Supine to sit: Min assist     General bed mobility comments: cues for sternal precaution, minor assist  Transfers Overall transfer level: Needs assistance   Transfers: Sit to/from Stand Sit to Stand: Min assist         General transfer comment: cues for sternal precautions, minimal assist to come forward.  Ambulation/Gait Ambulation/Gait assistance: Min assist Gait Distance (Feet): 120 Feet Assistive device: Rolling walker (2 wheeled) Gait Pattern/deviations: Step-through pattern     General Gait Details: slow, guarded, short steps, mildly flexed posture   Stairs             Wheelchair Mobility    Modified Rankin (Stroke Patients Only)       Balance Overall balance assessment: Needs assistance Sitting-balance support: No upper extremity supported Sitting balance-Leahy Scale: Fair     Standing balance support: Single extremity supported;No upper extremity supported Standing balance-Leahy Scale: Poor Standing balance comment: reliant on AD or minimal supportive assist                            Cognition Arousal/Alertness:  Awake/alert Behavior During Therapy: Flat affect;Anxious Overall Cognitive Status: Within Functional Limits for tasks assessed                                        Exercises      General Comments General comments (skin integrity, edema, etc.): sats on 2 L [place] at 98-100%, EHR max at 95bpm      Pertinent Vitals/Pain Pain Assessment: Faces Faces Pain Scale: Hurts little more Pain Location: chest and overall Pain Descriptors / Indicators: Sore Pain Intervention(s): Monitored during session    Home Living                      Prior Function            PT Goals (current goals can now be found in the care plan section) Acute Rehab PT Goals PT Goal Formulation: With patient Time For Goal Achievement: 10/20/18 Potential to Achieve Goals: Good Progress towards PT goals: Progressing toward goals    Frequency    Min 3X/week      PT Plan Current plan remains appropriate    Co-evaluation              AM-PAC PT "6 Clicks" Mobility   Outcome Measure  Help needed turning from your back to your side while in a flat bed without  using bedrails?: A Little Help needed moving from lying on your back to sitting on the side of a flat bed without using bedrails?: A Little Help needed moving to and from a bed to a chair (including a wheelchair)?: A Little Help needed standing up from a chair using your arms (e.g., wheelchair or bedside chair)?: A Little Help needed to walk in hospital room?: A Little Help needed climbing 3-5 steps with a railing? : A Lot 6 Click Score: 17    End of Session Equipment Utilized During Treatment: Oxygen Activity Tolerance: Patient limited by fatigue Patient left: in bed;with call bell/phone within reach Nurse Communication: Mobility status PT Visit Diagnosis: Other abnormalities of gait and mobility (R26.89);Pain;Difficulty in walking, not elsewhere classified (R26.2) Pain - part of body: (sternal)     Time:  1720-1739 PT Time Calculation (min) (ACUTE ONLY): 19 min  Charges:  $Gait Training: 8-22 mins                     10/08/2018  Donnella Sham, [place] [phone]  (pager) [phone]  (office)   Tessie Fass Kaiyu Mirabal 10/08/2018, 5:48 PM

## 2018-10-08 NOTE — Progress Notes (Signed)
Pt states he feels like he is suffocating. Sat 97 or 2lpm color great. B/s=bilat clr in tops, diminished bases. Dr. Roxan Hockey advises ok for breathing tx. Will call respiratory. Pt educated on IS and the importance of deep breathing. Will continue to monitor pt. Jerald Kief, RN

## 2018-10-08 NOTE — Progress Notes (Signed)
Pt voided 100 in urinal. Bladder scan reveals 56cc of urin still in bladder.  No other residual found.  Jerald Kief, RN

## 2018-10-08 NOTE — Progress Notes (Addendum)
[address]       [place] [address]             [phone]      7 Days Post-Op Procedure(s) (LRB): CORONARY ARTERY BYPASS GRAFTING (CABG) TIMES  FOUR USING LEFT MAMMARY ARTERY AND RIGHT GREATER SAPHEANOUS VEIN HARVESTED ENDOSCOPICALLY (N/A) TRANSESOPHAGEAL ECHOCARDIOGRAM (TEE) (N/A) Subjective: conts to slowly feel better, progress remains slow overall  Objective: Vital signs in last 24 hours: Temp:  [97.6 F (36.4 C)-98.7 F (37.1 C)] 97.6 F (36.4 C) (06/11 0616) Pulse Rate:  [69-88] 74 (06/11 0616) Cardiac Rhythm: Normal sinus rhythm (06/11 0700) Resp:  [17-23] 21 (06/11 0616) BP: (112-148)/(59-83) 140/61 (06/11 0616) SpO2:  [96 %-100 %] 96 % (06/11 0616) Weight:  [138.8 kg] 138.8 kg (06/11 0616)  Hemodynamic parameters for last 24 hours:    Intake/Output from previous day: 06/10 0701 - 06/11 0700 In: 597 [P.O.:597] Out: 1075 [Urine:1075] Intake/Output this shift: No intake/output data recorded.  General appearance: alert, cooperative, distracted, fatigued and no distress Heart: regular rate and rhythm Lungs: dim in bases, mostly clear Abdomen: soft, nontender Extremities: edema about the same in LE's Wound: incis healing well  Lab Results: Recent Labs    10/07/18 0653 10/08/18 0249  WBC 13.2* 10.5  HGB 8.9* 7.7*  HCT 26.7* 24.3*  PLT 384 335   BMET:  Recent Labs    10/07/18 0653 10/08/18 0249  NA 137 133*  K 4.5 4.1  CL 95* 93*  CO2 28 30  GLUCOSE 56* 91  BUN 42* 40*  CREATININE 1.89* 2.11*  CALCIUM 8.9 8.3*    PT/INR: No results for input(s): LABPROT, INR in the last 72 hours. ABG    Component Value Date/Time   PHART 7.343 (L) 10/01/2018 1946   HCO3 21.5 10/01/2018 1946   TCO2 22 10/01/2018 2009   ACIDBASEDEF 4.0 (H) 10/01/2018 1946   O2SAT 95.0 10/01/2018 1946   CBG (last 3)  Recent Labs    10/07/18 2233 10/08/18 0017 10/08/18 0620  GLUCAP 64* 112* 95    Meds Scheduled Meds: . amiodarone  400 mg  Oral BID  . aspirin EC  325 mg Oral Daily   Or  . aspirin  324 mg Per Tube Daily  . bisacodyl  10 mg Oral Daily   Or  . bisacodyl  10 mg Rectal Daily  . brimonidine  1 drop Left Eye BID  . docusate sodium  200 mg Oral Daily  . dorzolamide  1 drop Left Eye TID  . enoxaparin (LOVENOX) injection  40 mg Subcutaneous QHS  . furosemide  40 mg Oral Daily  . hydrochlorothiazide  25 mg Oral Daily  . insulin aspart  0-20 Units Subcutaneous TID WC  . insulin aspart  25 Units Subcutaneous TID WC  . insulin detemir  70 Units Subcutaneous BID  . mouth rinse  15 mL Mouth Rinse BID  . metoprolol tartrate  12.5 mg Oral BID   Or  . metoprolol tartrate  12.5 mg Per Tube BID  . pantoprazole  40 mg Oral Daily  . potassium chloride  20 mEq Oral BID  . rosuvastatin  40 mg Oral q1800  . sodium chloride flush  3 mL Intravenous Q12H  . tamsulosin  0.4 mg Oral Daily   Continuous Infusions: . sodium chloride     PRN Meds:.sodium chloride, alum & mag hydroxide-simeth, lactulose, levalbuterol, lidocaine (PF), ondansetron (ZOFRAN) IV, oxyCODONE, polyethylene glycol, sodium chloride flush, traMADol  Xrays  Dg Chest 1 View  Result Date: 10/06/2018 CLINICAL DATA:  Status post left thoracentesis. EXAM: CHEST  1 VIEW COMPARISON:  Chest x-ray from yesterday. FINDINGS: Stable cardiomediastinal silhouette status post CABG. Persistent low lung volumes with small bilateral pleural effusions and bibasilar atelectasis. No pneumothorax. No acute osseous abnormality. IMPRESSION: 1. Unchanged small bilateral pleural effusions and bibasilar atelectasis. No pneumothorax. Electronically Signed   By: Titus Dubin M.D.   On: 10/06/2018 14:51   Dg Chest Port 1 View  Result Date: 10/07/2018 CLINICAL DATA:  History of coronary bypass grafting and recent left thoracentesis. EXAM: PORTABLE CHEST 1 VIEW COMPARISON:  10/06/2018 FINDINGS: Cardiac shadow is enlarged. Postsurgical changes are again noted. Bilateral pleural effusions are  noted right greater than left. No pneumothorax is seen. Mild central vascular congestion is noted as well. IMPRESSION: Changes of CHF with bilateral effusions. Electronically Signed   By: Inez Catalina M.D.   On: 10/07/2018 08:00   Ir Thoracentesis Asp Pleural Space W/img Guide  Result Date: 10/06/2018 INDICATION: Patient with history of MI s/p CABG x3 10/02/2018 by Dr. Koleen Nimrod, dyspnea, and bilateral pleural effusions. Request is made for therapeutic left thoracentesis. EXAM: ULTRASOUND GUIDED THERAPEUTIC LEFT THORACENTESIS MEDICATIONS: 10 mL 1% lidocaine COMPLICATIONS: None immediate. PROCEDURE: An ultrasound guided thoracentesis was thoroughly discussed with the patient and questions answered. The benefits, risks, alternatives and complications were also discussed. The patient understands and wishes to proceed with the procedure. Written consent was obtained. Ultrasound was performed to localize and mark an adequate pocket of fluid in the left chest. The area was then prepped and draped in the normal sterile fashion. 1% Lidocaine was used for local anesthesia. Under ultrasound guidance a 6 Fr Safe-T-Centesis catheter was introduced. Thoracentesis was performed. The catheter was removed and a dressing applied. FINDINGS: A total of approximately 600 mL of dark red fluid was removed. IMPRESSION: Successful ultrasound guided left thoracentesis yielding 600 mL of pleural fluid. Read by: Earley Abide, PA-C Electronically Signed   By: [name].  Shick M.D.   On: 10/06/2018 14:55    Assessment/Plan: S/P Procedure(s) (LRB): CORONARY ARTERY BYPASS GRAFTING (CABG) TIMES  FOUR USING LEFT MAMMARY ARTERY AND RIGHT GREATER SAPHEANOUS VEIN HARVESTED ENDOSCOPICALLY (N/A) TRANSESOPHAGEAL ECHOCARDIOGRAM (TEE) (N/A)  1 hemodyn stable with some mild htn at times, renal fxn is a little worse so cant resume ARB yet- monitor as most readings acceptable. Currently on 40 lasix/HCTZ/metoprolol 2 sinus rhythm on amiodarone 3 cont  aggressive pulm toilet, O2 down to 2 liters, CXR improving atx effusions 4 cont PT, may benefit from CIR 5 no fevers, mild increase of leukocytosis- cont to monitor closely, will check UA 6 H/H fairly stable ABL anemia 7 BS running low at times- decrease levemir further 8 encouraged patient that he is overall doing better- he is poorly motivated  LOS: 9 days    John Giovanni PA-C 10/08/2018 Pager [phone]  Patient seen and examined, agree with above  Remo Lipps C. Roxan Hockey, MD [facility] [phone]

## 2018-10-09 LAB — BASIC METABOLIC PANEL
Anion gap: 11 (ref 5–15)
BUN: 40 mg/dL — ABNORMAL HIGH (ref 8–23)
CO2: 26 mmol/L (ref 22–32)
Calcium: 8.3 mg/dL — ABNORMAL LOW (ref 8.9–10.3)
Chloride: 91 mmol/L — ABNORMAL LOW (ref 98–111)
Creatinine, Ser: 1.98 mg/dL — ABNORMAL HIGH (ref 0.61–1.24)
GFR calc Af Amer: 37 mL/min — ABNORMAL LOW (ref >60)
GFR calc non Af Amer: 32 mL/min — ABNORMAL LOW (ref >60)
Glucose, Bld: 219 mg/dL — ABNORMAL HIGH (ref 70–99)
Potassium: 4.7 mmol/L (ref 3.5–5.1)
Sodium: 128 mmol/L — ABNORMAL LOW (ref 135–145)

## 2018-10-09 LAB — GLUCOSE, CAPILLARY
Glucose-Capillary: 186 mg/dL — ABNORMAL HIGH (ref 70–99)
Glucose-Capillary: 196 mg/dL — ABNORMAL HIGH (ref 70–99)
Glucose-Capillary: 187 mg/dL — ABNORMAL HIGH (ref 70–99)
Glucose-Capillary: 228 mg/dL — ABNORMAL HIGH (ref 70–99)

## 2018-10-09 MED ORDER — POTASSIUM CHLORIDE CRYS ER 10 MEQ PO TBCR
10.0000 meq | EXTENDED_RELEASE_TABLET | Freq: Two times a day (BID) | ORAL | Status: DC
  Administered 2018-10-09 – 2018-10-17 (×16): 10 meq via ORAL
  Filled 2018-10-09 (×16): qty 1

## 2018-10-09 MED ORDER — AMLODIPINE BESYLATE 5 MG PO TABS
5.0000 mg | ORAL_TABLET | Freq: Every day | ORAL | Status: DC
  Administered 2018-10-09 – 2018-10-19 (×11): 5 mg via ORAL
  Filled 2018-10-09 (×11): qty 1

## 2018-10-09 MED ORDER — MIRTAZAPINE 15 MG PO TBDP
15.0000 mg | ORAL_TABLET | Freq: Every day | ORAL | Status: DC
  Administered 2018-10-09 – 2018-10-18 (×10): 15 mg via ORAL
  Filled 2018-10-09 (×11): qty 1

## 2018-10-09 MED ORDER — FUROSEMIDE 40 MG PO TABS
40.0000 mg | ORAL_TABLET | Freq: Every day | ORAL | Status: DC
  Administered 2018-10-09 – 2018-10-14 (×6): 40 mg via ORAL
  Filled 2018-10-09 (×6): qty 1

## 2018-10-09 NOTE — Discharge Summary (Signed)
Physician Discharge Summary  Patient ID: Martin [name]. MRN: [id number] DOB/AGE: January 07, 1946 73 y.o.  Admit date: 09/29/2018 Discharge date: 10/19/2018  Admission Diagnoses:Severe CAD  Discharge Diagnoses:  Active Problems:   DM (diabetes mellitus) type II uncontrolled with eye manifestation ([place])   Hypertension associated with diabetes ([place])   Morbid obesity ([place])   Hyperlipidemia   ST elevation myocardial infarction involving right coronary artery (HCC)   STEMI involving right coronary artery (HCC)   STEMI (ST elevation myocardial infarction) (HCC)   S/P CABG x 4   Coronary artery disease involving native coronary artery of native heart with unstable angina pectoris [facility])   Patient Active Problem List   Diagnosis Date Noted  . Coronary artery disease involving native coronary artery of native heart with unstable angina pectoris ([place])   . 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 STEMI involving right coronary artery ([place]) 09/29/2018  . STEMI (ST elevation myocardial infarction) ([place]) 09/29/2018  . ST elevation myocardial infarction involving right coronary artery ([place])   . Onychomycosis of toenail 02/17/2018  . Allergic rhinitis 08/20/2017  . PAD (peripheral artery disease) ([place]) 08/20/2017  . Partial tear of right Achilles tendon 02/12/2017  . Aortic atherosclerosis ([place]) 10/07/2016  . C. difficile colitis 09/25/2016  . Aspiration pneumonia ([place]) 09/25/2016  . History of skin cancer 08/15/2015  . Undiagnosed cardiac murmurs 08/15/2015  . Diabetic retinopathy ([place]) 02/21/2015  . Former smoker 08/24/2014  . Hyperlipidemia 08/24/2014  . Ingrowing toenail 07/19/2014  . Morbid obesity ([place]) 04/12/2014  . Diabetic polyneuropathy ([place]) 04/11/2009  . BACK PAIN, LUMBAR, WITH RADICULOPATHY 08/30/2008  . DM (diabetes mellitus) type II uncontrolled with eye manifestation ([place]) 02/03/2007  . Hypertension associated with diabetes ([place]) 02/03/2007   History of present illness:  The patient is  a 73 year old male with a history of severe diabetes mellitus and hypertension who presented to [facility] on 09/29/2018 with STEMI.  The patient was traveling to [facility] earlier in the day when he developed a sudden onset of chest tightness with radiation to his left arm but he did proceed back to [place].  The patient was concerned about going straight to the emergency department because his wife was with him and he did not want to get her exposed to [place].  So they went home and called EMS and he was transported to the emergency department.  He states that he is in his usual state of health without shortness of breath, lower extremity edema, orthopnea, PND, dizziness or syncope but notes for approximately 10 to 15 years he has had occasional chest tightness that he associated with stressful situations which would dissipate on their own.  He had a stress test approximately 3 years ago and he reports that this was normal with no significant follow-up following that test.  He has a long history of diabetes and his hemoglobin A1c runs in the 6-8 range.  He has a remote history of tobacco abuse but quit many years ago.  He was admitted for further evaluation and treatment to include cardiac catheterization.    Discharged Condition: fair  Hospital Course:  He was taken emergently to the Cath Lab and was found to have severe three-vessel coronary artery disease.  He was medically stabilized and a cardiothoracic surgical consultation was obtained with Erasmo Leventhal, MD who evaluated the patient and studies and agreed with recommendations to proceed with CABG.  On 10/01/2018 he was taken to the operating room where he underwent the below  described procedure.  He tolerated it well and was taken to the surgical intensive care unit in stable condition.  Postoperative hospital course:  The patient has had a prolonged postoperative course.  He has overall done well but due to obesity and  significant deconditioning it has been somewhat difficult.  He has maintained stable hemodynamics but did have episode of atrial fibrillation.  He was successfully chemically cardioverted to sinus rhythm on amiodarone and beta-blocker.  He has had difficulty with postoperative atelectasis and required aggressive pulmonary toilet and nebs due to very shallow breathing and poor effort.  He has had some postoperative renal insufficiency initially requiring renal dose dopamine which was weaned over time.  He has responded to diuretics and creatinine is stabilizing.  Most recent BUN and creatinine are 1.51/33.  He also has had a postoperative acute blood loss anemia requiring transfusion.  His values have stabilized and most recent hemoglobin and hematocrit are 8.9/26.7.  His blood sugars have been very difficult to manage requiring frequent adjustment in insulin.  It is notable that he was on 150 units of Lantus daily at home.  He will require aggressive long-term management of his diabetes with lifestyle and nutrition management in addition to his medications.  His mobilization has gone quite slowly and physical therapy has assisted with this.  He is unfortunately poorly motivated and there may be some component of depression.  He was  evaluated by CIR for possible admission to assist with his recovery prior to discharge home but was not a candidate. He will go to A SNF short term  For rehab when a bed becomes available.  Consults: cardiology  Significant Diagnostic Studies: angiography: Cardiac catheterization, echocardiogram  Treatments: surgery:  OPERATIVE REPORT  DATE OF PROCEDURE:  10/01/2018  PREOPERATIVE DIAGNOSES:  Three-vessel coronary artery disease, status post myocardial infarction.  POSTOPERATIVE DIAGNOSIS:  Three-vessel coronary artery disease, status post myocardial  infarction.  PROCEDURE:  Median sternotomy, extracorporeal circulation, coronary artery bypass grafting x3, left  internal mammary artery to left anterior descending, saphenous vein graft to first diagonal, saphenous vein graft to posterior descending, saphenous vein  graft to the posterolateral (Y graft off of PD vein), endoscopic vein harvest, right leg.  SURGEON:  Modesto Charon, MD  ASSISTANT:  Jadene Pierini, PA-C   ANESTHESIA:  General.  FINDINGS:  A morbidly obese, stiff chest wall avulsion of mammary branch.  Mammary otherwise good quality.  Vein good quality.  Targets fair quality.  Preserved left ventricular function with no significant valvular pathology on echocardiogram. Discharge Exam: Blood pressure 110/61, pulse 78, temperature 98.4 F (36.9 C), temperature source Axillary, resp. rate (!) 24, height 6' (1.829 m), weight 121 kg, SpO2 97 %.  General appearance: alert, cooperative and no distress Heart: regular rate and rhythm Lungs: clear to auscultation bilaterally Abdomen: soft, non-tender; bowel sounds normal; no masses,  no organomegaly Extremities: edema trace Wound: clean and dry  Discharge disposition: 03-Skilled Nursing Facility   Discharge Instructions    Amb Referral to Cardiac Rehabilitation   Complete by: As directed    Diagnosis: CABG   CABG X ___: 4   After initial evaluation and assessments completed: Virtual Based Care may be provided alone or in conjunction with Phase 2 Cardiac Rehab based on patient barriers.: Yes     Allergies as of 10/19/2018      Reactions   Simvastatin Diarrhea      Medication List    STOP taking these medications   ibuprofen 200 MG tablet  Commonly known as: ADVIL   valsartan-hydrochlorothiazide 320-25 MG tablet Commonly known as: DIOVAN-HCT     TAKE these medications   amiodarone 200 MG tablet Commonly known as: PACERONE Take 1 tablet (200 mg total) by mouth daily. Start taking on: October 20, 2018   amLODipine 5 MG tablet Commonly known as: NORVASC Take 1 tablet (5 mg total) by mouth daily. Start taking on: October 20, 2018   aspirin 325 MG EC tablet Take 1 tablet (325 mg total) by mouth daily. Start taking on: October 20, 2018   Besivance 0.6 % Susp Generic drug: Besifloxacin HCl Place 1 drop into the left eye 3 (three) times daily.   brimonidine 0.2 % ophthalmic solution Commonly known as: ALPHAGAN Place 1 drop into the left eye 2 (two) times a day.   dorzolamide 2 % ophthalmic solution Commonly known as: TRUSOPT Place 1 drop into the left eye 3 (three) times daily.   feeding supplement (GLUCERNA SHAKE) Liqd Take 237 mLs by mouth 3 (three) times daily between meals.   guaiFENesin 600 MG 12 hr tablet Commonly known as: MUCINEX Take 2 tablets (1,200 mg total) by mouth 2 (two) times daily as needed for cough or to loosen phlegm.   Insulin Pen Needle 32G X 6 MM Misc Commonly known as: NovoFine USE 5 TIMES DAILY   Lantus SoloStar 100 UNIT/ML Solostar Pen Generic drug: Insulin Glargine INJECT 150 UNITS INTO THE SKIN EVERY MORNING. What changed: See the new instructions.   metoprolol tartrate 25 MG tablet Commonly known as: LOPRESSOR Take 0.5 tablets (12.5 mg total) by mouth 2 (two) times daily.   mirtazapine 15 MG disintegrating tablet Commonly known as: REMERON SOL-TAB Take 1 tablet (15 mg total) by mouth at bedtime.   NovoLOG FlexPen 100 UNIT/ML FlexPen Generic drug: insulin aspart INJECT 20 UNITS INTO THE SKIN 2 (TWO) TIMES DAILY WITH A MEAL. AND PEN NEEDLES 3/DAY What changed: See the new instructions.   ofloxacin 0.3 % ophthalmic solution Commonly known as: OCUFLOX Place 1 drop into both eyes. Administer 1 drop in both eyes up to 4 times a day for 2 days after monthly injections   OneTouch Verio test strip Generic drug: glucose blood CHECK LEVELS 4 TIMES DAILY   oxyCODONE 5 MG immediate release tablet Commonly known as: Oxy IR/ROXICODONE Take 1-2 tablets (5-10 mg total) by mouth every 3 (three) hours as needed for severe pain.   Pitavastatin Calcium 1 MG Tabs Take 1 tablet  (1 mg total) by mouth 2 (two) times a week.   tamsulosin 0.4 MG Caps capsule Commonly known as: FLOMAX Take 1 capsule (0.4 mg total) by mouth daily. Start taking on: October 20, 2018      Follow-up Information    Melrose Nakayama, MD Follow up.   Specialty: Cardiothoracic Surgery Why: office will contact you Contact information: [address] [place] [address] [phone]        Troy Sine, MD Follow up.   Specialty: Cardiology Why: see discharge paperwork for cardiology f/u appt Contact information: [address] [place] [address] [phone]          The patient has been discharged on:   1.Beta Blocker:  Yes [ y ]                              No   [   ]  If No, reason:  2.Ace Inhibitor/ARB: Yes [   ]                                     No  [ n   ]                                     If No, reason:renal insuff  3.Statin:   Yes [ y  ]                  No  [   ]                  If No, reason:  4.Ecasa:  Yes  [ y  ]                  No   [   ]                  If No, reason:  1. Please obtain vital signs at least one time daily 2.Please weigh the patient daily. If he or she continues to gain weight or develops lower extremity edema, contact the office at [phone]. 3. Ambulate patient at least three times daily and please use sternal precautions. Signed:  Original Note By Jadene Pierini PA-C  Update by: Ellwood Handler PA-C 10/19/2018, 12:56 PM

## 2018-10-09 NOTE — Progress Notes (Addendum)
[address]       [place] [address]             [phone]      8 Days Post-Op Procedure(s) (LRB): CORONARY ARTERY BYPASS GRAFTING (CABG) TIMES  FOUR USING LEFT MAMMARY ARTERY AND RIGHT GREATER SAPHEANOUS VEIN HARVESTED ENDOSCOPICALLY (N/A) TRANSESOPHAGEAL ECHOCARDIOGRAM (TEE) (N/A) Subjective: Says he feels better, had some increased SOB last evening, better this am   Objective: Vital signs in last 24 hours: Temp:  [97.5 F (36.4 C)-97.7 F (36.5 C)] 97.6 F (36.4 C) (06/12 0617) Pulse Rate:  [67-78] 67 (06/12 0617) Cardiac Rhythm: Normal sinus rhythm (06/11 2031) Resp:  [2-22] 18 (06/12 0617) BP: (141-156)/(68-72) 153/72 (06/12 0617) SpO2:  [95 %-100 %] 100 % (06/12 0617) Weight:  [133.8 kg] 133.8 kg (06/12 0500)  Hemodynamic parameters for last 24 hours:    Intake/Output from previous day: 06/11 0701 - 06/12 0700 In: 1101.2 [P.O.:1081; I.V.:20.2] Out: 1500 [Urine:1500] Intake/Output this shift: No intake/output data recorded.  General appearance: alert, cooperative, distracted, fatigued and no distress Heart: regular rate and rhythm and + rub Lungs: dim in lower fields, but improved Abdomen: obese, nontender Extremities: + edema, about same Wound: incis healing well  Lab Results: Recent Labs    10/07/18 0653 10/08/18 0249  WBC 13.2* 10.5  HGB 8.9* 7.7*  HCT 26.7* 24.3*  PLT 384 335   BMET:  Recent Labs    10/08/18 0249 10/09/18 0330  NA 133* 128*  K 4.1 4.7  CL 93* 91*  CO2 30 26  GLUCOSE 91 219*  BUN 40* 40*  CREATININE 2.11* 1.98*  CALCIUM 8.3* 8.3*    PT/INR: No results for input(s): LABPROT, INR in the last 72 hours. ABG    Component Value Date/Time   PHART 7.343 (L) 10/01/2018 1946   HCO3 21.5 10/01/2018 1946   TCO2 22 10/01/2018 2009   ACIDBASEDEF 4.0 (H) 10/01/2018 1946   O2SAT 95.0 10/01/2018 1946   CBG (last 3)  Recent Labs    10/08/18 1614 10/08/18 2214 10/09/18 0621  GLUCAP 153* 196* 186*    Meds Scheduled Meds: . amiodarone  400 mg Oral BID  . aspirin EC  325 mg Oral Daily   Or  . aspirin  324 mg Per Tube Daily  . bisacodyl  10 mg Oral Daily   Or  . bisacodyl  10 mg Rectal Daily  . brimonidine  1 drop Left Eye BID  . docusate sodium  200 mg Oral Daily  . dorzolamide  1 drop Left Eye TID  . enoxaparin (LOVENOX) injection  40 mg Subcutaneous QHS  . hydrochlorothiazide  25 mg Oral Daily  . insulin aspart  0-20 Units Subcutaneous TID WC  . insulin aspart  25 Units Subcutaneous TID WC  . insulin detemir  50 Units Subcutaneous BID  . mouth rinse  15 mL Mouth Rinse BID  . metoprolol tartrate  12.5 mg Oral BID   Or  . metoprolol tartrate  12.5 mg Per Tube BID  . pantoprazole  40 mg Oral Daily  . potassium chloride  20 mEq Oral BID  . rosuvastatin  40 mg Oral q1800  . sodium chloride flush  3 mL Intravenous Q12H  . tamsulosin  0.4 mg Oral Daily   Continuous Infusions: . sodium chloride     PRN Meds:.sodium chloride, alum & mag hydroxide-simeth, lactulose, levalbuterol, lidocaine (PF), ondansetron (ZOFRAN) IV, oxyCODONE, polyethylene glycol, sodium chloride flush, traMADol  Xrays Dg  Chest 2 View  Result Date: 10/08/2018 CLINICAL DATA:  Shortness of breath EXAM: CHEST - 2 VIEW COMPARISON:  10/07/2018 FINDINGS: Unchanged low volume examination with minimal interstitial pulmonary opacity consistent with edema and layering bilateral pleural effusions. No new or focal airspace opacity. Cardiomegaly status post median sternotomy and CABG. IMPRESSION: Unchanged low volume examination with minimal interstitial pulmonary opacity consistent with edema and layering bilateral pleural effusions. No new or focal airspace opacity. Cardiomegaly status post median sternotomy and CABG. Electronically Signed   By: Eddie Candle M.D.   On: 10/08/2018 08:28    Assessment/Plan: S/P Procedure(s) (LRB): CORONARY ARTERY BYPASS GRAFTING (CABG) TIMES  FOUR USING LEFT MAMMARY ARTERY AND RIGHT GREATER  SAPHEANOUS VEIN HARVESTED ENDOSCOPICALLY (N/A) TRANSESOPHAGEAL ECHOCARDIOGRAM (TEE) (N/A)  1 progress remains slow overall, motivation conts to be an issue as well as overall deconditioning, will see if candidate for CIR 2 hemodyn stable with HTN, sinus rhythm- will add norvasc as renal fxn limits resumption of Diovan 3 BUN/creat trend a little better, hyponatremic to 128- will add fluid restriction Will leave on lasix 40 q day, HCTZ 4 d/c pacer wires 5 cont pulm toilet/PT/Rehab 6 UA pretty unremarkable- small blood, some casts, rare bacteria  7 BS control a bit better without hypoglycemia 8 repeat labs in am 9 considering Remeron for depression component of poor motivation- MD to decide   LOS: 10 days    John Giovanni PA-C 10/09/2018 Pager [phone]  Patient seen and examined, feels a little better this morning Creatinine is down slightly, still edematous but at approximately his preop weight Will try low dose remeron  Remo Lipps C. Roxan Hockey, MD [facility] [phone]

## 2018-10-09 NOTE — Progress Notes (Signed)
Epicardial pacing wires removed w/o difficulty.  Vitals Q15 mins x 1 hour.  Bed rest initiated.

## 2018-10-09 NOTE — Progress Notes (Signed)
SATURATION QUALIFICATIONS: (This note is used to comply with regulatory documentation for home oxygen)  Patient Saturations on Room Air at Rest = 93%  Patient Saturations on Room Air while Ambulating =84   Patient Saturations on 4 Liters of oxygen while Ambulating = 90%  Please briefly explain why patient needs home oxygen:Pt needed 4L O2 to keep sats >90% with activity.  Took incr time to recover after walk.   [place] Pager:  [phone]  Office:  [phone]

## 2018-10-09 NOTE — Care Management Important Message (Signed)
Important Message  Patient Details  Name: Martin Cerveny [facility] Sr. MRN: [id number] Date of Birth: [birth year]   Medicare Important Message Given:  Yes    [name] 10/09/2018, 11:50 AM

## 2018-10-09 NOTE — Progress Notes (Addendum)
Inpatient Rehab Admissions:  Inpatient Rehab Consult received.  I met with patient at the bedside for rehabilitation assessment and to discuss goals and expectations of an inpatient rehab admission.  He agrees that he needs some rehab before d/c.  Note no OT ordered, which I will need to be able to open insurance, and I've paged Jadene Pierini, [place] for that consult.  I also discussed with patient that his insurance will need to approve an inpatient rehab stay and I was doubtful that they would approve for his diagnosis and current functional level.  Will continue to follow to assess for possible admission next week pending OT eval, patient progress/tolerance, and insurance approval.   Signed: Shann Medal, PT, DPT Admissions Coordinator [phone] 10/09/18  11:29 AM

## 2018-10-09 NOTE — Progress Notes (Signed)
CARDIAC REHAB PHASE I   PRE:  Rate/Rhythm: 63 SR    BP: sitting 154/85    SaO2: 92 2L  MODE:  Ambulation: 70 ft   POST:  Rate/Rhythm: 80 SR    BP: sitting 166/85     SaO2: 97 2L  Pt anxious and needs many verbal cues to act/motivate. Likes bed raised to stand. Able to stand with min assist x2. Used RW and 2L in hall. Encouraged pt to increase distance, which he did walk 30 ft farther than he wanted to, but pt with increased anxiety getting to bed. Upon sitting pt c/o SOB and dizziness and feeling that he will pass out. Sat pt straight up assisted. BP stable. Had to get pt up again due to incontinence. He moves fairly well but needs constant cues for what to do and when to do it. Will f/u as x2 with gait belt and maybe follow with chair. [place], ACSM 10/09/2018 2:16 PM

## 2018-10-09 NOTE — Progress Notes (Addendum)
Physical Therapy Treatment Patient Details Name: Martin [name] [name] MRN: [id number] DOB: [birth year] Today's Date: 10/09/2018    History of Present Illness 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  On 6/4, pt s/p CABG x3    PT Comments    Pt admitted with above diagnosis. Pt currently with functional limitations due to the deficits listed below (see PT Problem List). Pt was able to ambulate with RW with min assist intially and progressing  to mod assist toward end of walk due to pt with anxiety due to DOE 3/4.  Pt does desat on RA to 84% and needed 4LO2 with activity to keep sats >90%.  Will follow acutely.  Nursing aware of pts anxiety and that BP was 191/74 at end of walk.  Will follow acutely.  Pt will benefit from skilled PT to increase their independence and safety with mobility to allow discharge to the venue listed below.     Follow Up Recommendations  CIR;Other (comment)     Equipment Recommendations  Other (comment)(TBA)    Recommendations for Other Services Rehab consult     Precautions / Restrictions Precautions Precautions: Sternal Restrictions Weight Bearing Restrictions: No    Mobility  Bed Mobility Overal bed mobility: Needs Assistance Bed Mobility: Supine to Sit     Supine to sit: Min assist     General bed mobility comments: cues for sternal precaution, minor assist  Transfers Overall transfer level: Needs assistance   Transfers: Sit to/from Stand Sit to Stand: Min assist, had to elevated bed for pt to get up and needed max cues to push up from knees         General transfer comment: cues for sternal precautions, minimal assist to come forward.  Ambulation/Gait Ambulation/Gait assistance: Min assist to mod assist Gait Distance (Feet): 100 Feet Assistive device: Rolling walker (2 wheeled) Gait Pattern/deviations: Step-through  pattern;Decreased stride length;Trunk flexed;Drifts right/left   Gait velocity interpretation: <1.31 ft/sec, indicative of household ambulator General Gait Details: slow, guarded, short steps, mildly flexed posture.  Pt self limiting and highly anxious.  Felt SOB and was very panicked once back to room.  Had to assist pt to supine and was a 10 min before pt was calmed down.  BP was 202/82 once in bed and took incr time to come to 191/74.  Notified nursing who states that pt is anxious with movement.  Pt did desat on RA to 84% and needs 4LO2 with activity to maintain sats.  Cues for pursed lip breathing.    Stairs             Wheelchair Mobility    Modified Rankin (Stroke Patients Only)       Balance Overall balance assessment: Needs assistance Sitting-balance support: No upper extremity supported Sitting balance-Leahy Scale: Fair     Standing balance support: No upper extremity supported;Bilateral upper extremity supported Standing balance-Leahy Scale: Poor Standing balance comment: reliant on AD or minimal to moderate supportive assist                            Cognition Arousal/Alertness: Awake/alert Behavior During Therapy: Flat affect;Anxious Overall Cognitive Status: Within Functional Limits for tasks assessed  Exercises Other Exercises Other Exercises: warm up hip/knee flex/ext ROM x 10 reps bil    General Comments General comments (skin integrity, edema, etc.): HR 75-95 bpm      Pertinent Vitals/Pain Pain Assessment: Faces Faces Pain Scale: Hurts little more Pain Location: chest and overall Pain Descriptors / Indicators: Sore Pain Intervention(s): Limited activity within patient's tolerance;Monitored during session;Repositioned    Home Living                      Prior Function            PT Goals (current goals can now be found in the care plan section) Acute Rehab PT  Goals Patient Stated Goal: Get my Independence back Progress towards PT goals: Progressing toward goals    Frequency    Min 3X/week      PT Plan Current plan remains appropriate    Co-evaluation              AM-PAC PT "6 Clicks" Mobility   Outcome Measure  Help needed turning from your back to your side while in a flat bed without using bedrails?: A Little Help needed moving from lying on your back to sitting on the side of a flat bed without using bedrails?: A Little Help needed moving to and from a bed to a chair (including a wheelchair)?: A Little Help needed standing up from a chair using your arms (e.g., wheelchair or bedside chair)?: A Little Help needed to walk in hospital room?: A Little Help needed climbing 3-5 steps with a railing? : A Lot 6 Click Score: 17    End of Session Equipment Utilized During Treatment: Oxygen;Gait belt Activity Tolerance: Patient limited by fatigue Patient left: in bed;with call bell/phone within reach;with bed alarm set Nurse Communication: Mobility status PT Visit Diagnosis: Other abnormalities of gait and mobility (R26.89);Pain;Difficulty in walking, not elsewhere classified (R26.2) Pain - part of body: (sternal)     Time: 3009-2330 PT Time Calculation (min) (ACUTE ONLY): 28 min  Charges:  $Gait Training: 23-37 mins                     [place] Pager:  [phone]  Office:  [place] 10/09/2018, 1:11 PM

## 2018-10-10 ENCOUNTER — Inpatient Hospital Stay (HOSPITAL_COMMUNITY): Payer: PPO

## 2018-10-10 ENCOUNTER — Other Ambulatory Visit: Payer: Self-pay | Admitting: Endocrinology

## 2018-10-10 DIAGNOSIS — E1139 Type 2 diabetes mellitus with other diabetic ophthalmic complication: Secondary | ICD-10-CM

## 2018-10-10 DIAGNOSIS — IMO0002 Reserved for concepts with insufficient information to code with codable children: Secondary | ICD-10-CM

## 2018-10-10 LAB — BASIC METABOLIC PANEL
Anion gap: 11 (ref 5–15)
BUN: 38 mg/dL — ABNORMAL HIGH (ref 8–23)
CO2: 29 mmol/L (ref 22–32)
Calcium: 8.8 mg/dL — ABNORMAL LOW (ref 8.9–10.3)
Chloride: 88 mmol/L — ABNORMAL LOW (ref 98–111)
Creatinine, Ser: 2.06 mg/dL — ABNORMAL HIGH (ref 0.61–1.24)
GFR calc Af Amer: 36 mL/min — ABNORMAL LOW (ref >60)
GFR calc non Af Amer: 31 mL/min — ABNORMAL LOW (ref >60)
Glucose, Bld: 244 mg/dL — ABNORMAL HIGH (ref 70–99)
Potassium: 4.9 mmol/L (ref 3.5–5.1)
Sodium: 128 mmol/L — ABNORMAL LOW (ref 135–145)

## 2018-10-10 LAB — CBC
HCT: 27.4 % — ABNORMAL LOW (ref 39.0–52.0)
Hemoglobin: 9 g/dL — ABNORMAL LOW (ref 13.0–17.0)
MCH: 29 pg (ref 26.0–34.0)
MCHC: 32.8 g/dL (ref 30.0–36.0)
MCV: 88.4 fL (ref 80.0–100.0)
Platelets: 394 10*3/uL (ref 150–400)
RBC: 3.1 MIL/uL — ABNORMAL LOW (ref 4.22–5.81)
RDW: 14.1 % (ref 11.5–15.5)
WBC: 12.9 10*3/uL — ABNORMAL HIGH (ref 4.0–10.5)
nRBC: 0.2 % (ref 0.0–0.2)

## 2018-10-10 LAB — GLUCOSE, CAPILLARY
Glucose-Capillary: 187 mg/dL — ABNORMAL HIGH (ref 70–99)
Glucose-Capillary: 110 mg/dL — ABNORMAL HIGH (ref 70–99)
Glucose-Capillary: 165 mg/dL — ABNORMAL HIGH (ref 70–99)
Glucose-Capillary: 201 mg/dL — ABNORMAL HIGH (ref 70–99)

## 2018-10-10 MED ORDER — INSULIN DETEMIR 100 UNIT/ML ~~LOC~~ SOLN
60.0000 [IU] | Freq: Two times a day (BID) | SUBCUTANEOUS | Status: DC
  Administered 2018-10-10 – 2018-10-19 (×14): 60 [IU] via SUBCUTANEOUS
  Filled 2018-10-10 (×20): qty 0.6

## 2018-10-10 MED ORDER — GUAIFENESIN ER 600 MG PO TB12
1200.0000 mg | ORAL_TABLET | Freq: Two times a day (BID) | ORAL | Status: DC
  Administered 2018-10-10 – 2018-10-15 (×11): 1200 mg via ORAL
  Filled 2018-10-10 (×11): qty 2

## 2018-10-10 NOTE — Progress Notes (Addendum)
[name]       [name] [address]             [phone]      9 Days Post-Op Procedure(s) (LRB): CORONARY ARTERY BYPASS GRAFTING (CABG) TIMES  FOUR USING LEFT MAMMARY ARTERY AND RIGHT GREATER SAPHEANOUS VEIN HARVESTED ENDOSCOPICALLY (N/A) TRANSESOPHAGEAL ECHOCARDIOGRAM (TEE) (N/A) Subjective: Feels tired this morning and states that he feels short of breath but its getting better. Oxygen saturation is 97% but RR is elevated. On 2L [place].   Objective: Vital signs in last 24 hours: Temp:  [97.6 F (36.4 C)-97.7 F (36.5 C)] 97.7 F (36.5 C) (06/13 0636) Pulse Rate:  [65-73] 68 (06/13 0636) Cardiac Rhythm: Normal sinus rhythm (06/13 0757) Resp:  [14-23] 18 (06/13 0636) BP: (138-191)/(65-79) 138/68 (06/13 0636) SpO2:  [93 %-98 %] 97 % (06/13 0636) Weight:  [132.1 kg] 132.1 kg (06/13 0500)     Intake/Output from previous day: 06/12 0701 - 06/13 0700 In: 477 [P.O.:477] Out: 1400 [Urine:1400] Intake/Output this shift: Total I/O In: -  Out: 280 [Urine:280]  General appearance: alert, cooperative and no distress Heart: regular rate and rhythm, S1, S2 normal, no murmur, click, rub or gallop Lungs: clear to auscultation bilaterally and diminished in the lower lobes Abdomen: soft, non-tender; bowel sounds normal; no masses,  no organomegaly Extremities: extremities normal, atraumatic, no cyanosis or edema Wound: clean and dry  Lab Results: Recent Labs    10/08/18 0249 10/10/18 0312  WBC 10.5 12.9*  HGB 7.7* 9.0*  HCT 24.3* 27.4*  PLT 335 394   BMET:  Recent Labs    10/09/18 0330 10/10/18 0312  NA 128* 128*  K 4.7 4.9  CL 91* 88*  CO2 26 29  GLUCOSE 219* 244*  BUN 40* 38*  CREATININE 1.98* 2.06*  CALCIUM 8.3* 8.8*    PT/INR: No results for input(s): LABPROT, INR in the last 72 hours. ABG    Component Value Date/Time   PHART 7.343 (L) 10/01/2018 1946   HCO3 21.5 10/01/2018 1946   TCO2 22 10/01/2018 2009   ACIDBASEDEF 4.0 (H)  10/01/2018 1946   O2SAT 95.0 10/01/2018 1946   CBG (last 3)  Recent Labs    10/09/18 1654 10/09/18 2204 10/10/18 0641  GLUCAP 187* 228* 187*    Assessment/Plan: S/P Procedure(s) (LRB): CORONARY ARTERY BYPASS GRAFTING (CABG) TIMES  FOUR USING LEFT MAMMARY ARTERY AND RIGHT GREATER SAPHEANOUS VEIN HARVESTED ENDOSCOPICALLY (N/A) TRANSESOPHAGEAL ECHOCARDIOGRAM (TEE) (N/A)  1. CV-NSR in the 60s, BP well controlled. 2. Pulm-tolerating 2L  with good oxygen saturation. Taking daily lasix and HCTZ for volume overload. I ordered mucinex due to his wet cough and continued mucous production. He has xopenex treatments that he was encouraged to use. Flutter valve ordered today. Continue incentive spirometer. He is only pulling 750 so needs to work on this hourly.  3. Renal-creatinine is 2.06 which is stable. Electrolytes okay. Continue diuretics 4. H and H 9.0/27.4, expected acute blood loss anemia. 5. Endo-blood glucose levels have been uncontrolled. Will increase Levemir. A1C 8.1- will need close outpatient follow-up.  6. Deconditioning- PT ordered will order OT. Recommending CIR. Encouraged ambulation.  7. Depression-started on low-dose Remeron.   Plan: Needs aggressive PT/OT and work with incentive spirometer and flutter valve. Weaning oxygen as tolerated. Work on constipation-may be contributing to his SOB.    LOS: 11 days    Martin [name] 10/10/2018  very slow progress I have seen and examined Martin [name].  and agree with the above assessment  and plan.  Grace Isaac MD [place] [phone] Office [phone] 10/10/2018 12:04 PM

## 2018-10-11 LAB — GLUCOSE, CAPILLARY
Glucose-Capillary: 128 mg/dL — ABNORMAL HIGH (ref 70–99)
Glucose-Capillary: 189 mg/dL — ABNORMAL HIGH (ref 70–99)
Glucose-Capillary: 203 mg/dL — ABNORMAL HIGH (ref 70–99)
Glucose-Capillary: 237 mg/dL — ABNORMAL HIGH (ref 70–99)

## 2018-10-11 NOTE — Progress Notes (Signed)
Patient assisted to chair this AM. Call bell with in reach. Will monitor patient. Clemens Lachman, Bettina Gavia rN

## 2018-10-11 NOTE — Evaluation (Signed)
Occupational Therapy Evaluation Patient Details Name: Martin [name] Sr. MRN: [id number] DOB: [birth year] Today's Date: 10/11/2018    History of Present Illness 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  On 6/4, pt s/p CABG x3   Clinical Impression   PTA patient reports independent with ADLs/mobility.  Admitted for above and limited by problem list below, including sternal precautions and adherence to, decreased activity tolerance, generalized weakness, impaired balance, and impaired cognition.  Patient oriented to self and time, but asking therapist "where am I"; able to follow 1 step commands given increased time but noted flat affect and slow processing/initation.  Pt verbalizing during session "I just don't have any motivation to get up".  Patient educated on importance of mobility, sternal precautions, ADL compensatory techniques and recommendations.  He requires mod assist for transfers, min assist for UB ADLs and mod-max assist for LB ADLs. He will benefit from intensive CIR level rehab in order to maximize independence with ADLs/mobility.  Will follow acutely.   3  Follow Up Recommendations  CIR    Equipment Recommendations  3 in 1 bedside commode    Recommendations for Other Services Rehab consult     Precautions / Restrictions Precautions Precautions: Sternal Precaution Comments: reviewed precautions with pt      Mobility Bed Mobility               General bed mobility comments: OOB upon entry  Transfers Overall transfer level: Needs assistance Equipment used: Rolling walker (2 wheeled) Transfers: Sit to/from Stand Sit to Stand: Mod assist         General transfer comment: cueing for sternal precautions, hand placement and technique; mod assist to power up from chair and EOB with max cueing to maintain forward positioning (multiple attempts  required)    Balance Overall balance assessment: Needs assistance Sitting-balance support: No upper extremity supported;Feet supported Sitting balance-Leahy Scale: Fair     Standing balance support: Bilateral upper extremity supported;During functional activity Standing balance-Leahy Scale: Poor Standing balance comment: relaint on support                           ADL either performed or assessed with clinical judgement   ADL Overall ADL's : Needs assistance/impaired     Grooming: Set up;Sitting   Upper Body Bathing: Minimal assistance;Sitting   Lower Body Bathing: Moderate assistance;Sit to/from stand Lower Body Bathing Details (indicate cue type and reason): decreased reach to B feet, mod assist sit<>stand  Upper Body Dressing : Moderate assistance;Sitting   Lower Body Dressing: Maximal assistance;Sit to/from stand Lower Body Dressing Details (indicate cue type and reason): decreased reach to B feet, mod assist sit<>stand  Toilet Transfer: Moderate assistance;Stand-pivot;RW Toilet Transfer Details (indicate cue type and reason): simulated to recliner          Functional mobility during ADLs: Moderate assistance;Rolling walker;Cueing for sequencing;Cueing for safety General ADL Comments: pt limited by cognition, impaired activity tolerance, pain and weakness      Vision         Perception     Praxis      Pertinent Vitals/Pain Pain Assessment: Faces Faces Pain Scale: Hurts little more Pain Location: chest Pain Descriptors / Indicators: Sore Pain Intervention(s): Limited activity within patient's tolerance;Monitored during session     Hand Dominance Right   Extremity/Trunk  Assessment Upper Extremity Assessment Upper Extremity Assessment: Generalized weakness   Lower Extremity Assessment Lower Extremity Assessment: Defer to PT evaluation       Communication Communication Communication: HOH   Cognition Arousal/Alertness:  Awake/alert Behavior During Therapy: Flat affect Overall Cognitive Status: Impaired/Different from baseline Area of Impairment: Orientation;Attention;Memory;Following commands;Safety/judgement;Awareness;Problem solving                 Orientation Level: Disoriented to;Place Current Attention Level: Sustained Memory: Decreased recall of precautions;Decreased short-term memory Following Commands: Follows one step commands consistently;Follows one step commands with increased time Safety/Judgement: Decreased awareness of safety;Decreased awareness of deficits Awareness: Intellectual Problem Solving: Slow processing;Decreased initiation;Difficulty sequencing;Requires verbal cues;Requires tactile cues General Comments: flat affect, disoriented to place (asking me where he is) and slow processing and initation of tasks    General Comments  VSS    Exercises     Shoulder Instructions      Home Living Family/patient expects to be discharged to:: Private residence Living Arrangements: Spouse/significant other Available Help at Discharge: Family;Available 24 hours/day Type of Home: House Home Access: Stairs to enter [employer] of Steps: 3 Entrance Stairs-Rails: Right;Left Home Layout: One level     Bathroom Shower/Tub: Teacher, early years/pre: Handicapped height     Home Equipment: Marine scientist - quad          Prior Functioning/Environment Level of Independence: Independent with assistive device(s)                 OT Problem List: Decreased strength;Decreased activity tolerance;Impaired balance (sitting and/or standing);Decreased cognition;Decreased coordination;Decreased safety awareness;Decreased knowledge of use of DME or AE;Decreased knowledge of precautions;Cardiopulmonary status limiting activity;Pain      OT Treatment/Interventions: Self-care/ADL training;Energy conservation;Therapeutic exercise;DME and/or AE instruction;Therapeutic  activities;Cognitive remediation/compensation;Patient/family education;Balance training    OT Goals(Current goals can be found in the care plan section) Acute Rehab OT Goals Patient Stated Goal: Get my Independence back OT Goal Formulation: With patient Time For Goal Achievement: 10/25/18 Potential to Achieve Goals: Good  OT Frequency: Min 2X/week   Barriers to D/C:            Co-evaluation              AM-PAC OT "6 Clicks" Daily Activity     Outcome Measure Help from another person eating meals?: None Help from another person taking care of personal grooming?: A Little Help from another person toileting, which includes using toliet, bedpan, or urinal?: Total Help from another person bathing (including washing, rinsing, drying)?: A Lot Help from another person to put on and taking off regular upper body clothing?: A Little Help from another person to put on and taking off regular lower body clothing?: A Lot 6 Click Score: 15   End of Session Equipment Utilized During Treatment: Rolling walker;Oxygen Nurse Communication: Mobility status  Activity Tolerance: Patient limited by fatigue Patient left: in chair;with call bell/phone within reach;with chair alarm set  OT Visit Diagnosis: Other abnormalities of gait and mobility (R26.89);Muscle weakness (generalized) (M62.81);Pain;Other symptoms and signs involving cognitive function Pain - part of body: (sternum- postop)                Time: 1458-1530 OT Time Calculation (min): 32 min Charges:  OT General Charges $OT Visit: 1 Visit OT Evaluation $OT Eval Moderate Complexity: 1 Mod OT Treatments $Self Care/Home Management : 8-22 mins  Delight Stare, OT Acute Rehabilitation Services Pager [phone] Office [phone]   Delight Stare 10/11/2018, 4:26 PM

## 2018-10-11 NOTE — Progress Notes (Signed)
Called by bedside RN d/t T-96.8 rectally.  Other VSS. Recommended placing warm blankets on patient, turning temperature up in room, and rechecking rectal temp in 1 hour. Please call RRT if further assistance needed.

## 2018-10-11 NOTE — Progress Notes (Addendum)
      [address]       [place] [address]             [phone]      10 Days Post-Op Procedure(s) (LRB): CORONARY ARTERY BYPASS GRAFTING (CABG) TIMES  FOUR USING LEFT MAMMARY ARTERY AND RIGHT GREATER SAPHEANOUS VEIN HARVESTED ENDOSCOPICALLY (N/A) TRANSESOPHAGEAL ECHOCARDIOGRAM (TEE) (N/A) Subjective: Breathing better this morning and using his flutter valve when I entered the room.   Objective: Vital signs in last 24 hours: Temp:  [95.9 F (35.5 C)-98.1 F (36.7 C)] 97.7 F (36.5 C) (06/14 0801) Pulse Rate:  [66-72] 72 (06/13 2002) Cardiac Rhythm: Normal sinus rhythm (06/13 1900) Resp:  [13-25] 18 (06/14 0801) BP: (103-157)/(45-78) 113/60 (06/14 0801) SpO2:  [95 %-99 %] 99 % (06/14 0440) Weight:  [130 kg] 130 kg (06/14 0457)     Intake/Output from previous day: 06/13 0701 - 06/14 0700 In: 300 [P.O.:300] Out: 2175 [Urine:2175] Intake/Output this shift: No intake/output data recorded.  General appearance: alert, cooperative and no distress Heart: regular rate and rhythm, S1, S2 normal, no murmur, click, rub or gallop Lungs: clear to auscultation bilaterally Abdomen: soft, non-tender; bowel sounds normal; no masses,  no organomegaly Extremities: extremities normal, atraumatic, no cyanosis or edema Wound: clean and dry  Lab Results: Recent Labs    10/10/18 0312  WBC 12.9*  HGB 9.0*  HCT 27.4*  PLT 394   BMET:  Recent Labs    10/09/18 0330 10/10/18 0312  NA 128* 128*  K 4.7 4.9  CL 91* 88*  CO2 26 29  GLUCOSE 219* 244*  BUN 40* 38*  CREATININE 1.98* 2.06*  CALCIUM 8.3* 8.8*    PT/INR: No results for input(s): LABPROT, INR in the last 72 hours. ABG    Component Value Date/Time   PHART 7.343 (L) 10/01/2018 1946   HCO3 21.5 10/01/2018 1946   TCO2 22 10/01/2018 2009   ACIDBASEDEF 4.0 (H) 10/01/2018 1946   O2SAT 95.0 10/01/2018 1946   CBG (last 3)  Recent Labs    10/10/18 1615 10/10/18 2125 10/11/18 0625  GLUCAP 165* 201*  128*    Assessment/Plan: S/P Procedure(s) (LRB): CORONARY ARTERY BYPASS GRAFTING (CABG) TIMES  FOUR USING LEFT MAMMARY ARTERY AND RIGHT GREATER SAPHEANOUS VEIN HARVESTED ENDOSCOPICALLY (N/A) TRANSESOPHAGEAL ECHOCARDIOGRAM (TEE) (N/A)  1. CV-NSR in the 60s-70s, BP well controlled. 2. Pulm-tolerating 2L [place] with good oxygen saturation. Taking daily lasix and HCTZ for volume overload. Mucinex due to his wet cough and continued mucous production. He has xopenex treatments that he was encouraged to use. Flutter valve ordered. Continue incentive spirometer. He is only pulling 750 so needs to work on this hourly.  3. Renal-creatinine is 2.06 which is stable. Electrolytes okay. Continue diuretics 4. H and H 9.0/27.4, expected acute blood loss anemia. 5. Endo-blood glucose levels have been variable. Levemir recently increased. A1C 8.1- will need close outpatient follow-up.  6. Deconditioning- PT ordered will order OT. Recommending CIR. Encouraged ambulation.  7. Depression-started on low-dose Remeron.  Plan: He is doing better today. Seems more motivated to use his incentive spirometer and flutter valve. He plans to walk later with PT/OT.    LOS: 12 days    Elgie Collard 10/11/2018  improved today , better effort with mobility and pulmonary toilet I have seen and examined [place]. and agree with the above assessment  and plan.  Grace Isaac MD Beeper [phone] Office [phone] 10/11/2018 12:24 PM

## 2018-10-11 NOTE — Progress Notes (Signed)
Unable to get oral temp. With 2 different machines. Did rt axillary got 95.9 did left axillary  Got 96.8 . Rectal temp. 96.2 Put warm blankets on patient and turn heat  Up from 68 to 78 depress. R.N. aware and will recheck in one hour. Mindy R.N. With rapid Response aware and enc. Blankets and turn heat up and recheck in 1 hour.

## 2018-10-11 NOTE — Progress Notes (Signed)
Patient wife called and updated on patient. All questions answered at this time.  Montzerrat Brunell, Bettina Gavia rN

## 2018-10-12 DIAGNOSIS — E1122 Type 2 diabetes mellitus with diabetic chronic kidney disease: Secondary | ICD-10-CM

## 2018-10-12 DIAGNOSIS — N183 Chronic kidney disease, stage 3 (moderate): Secondary | ICD-10-CM

## 2018-10-12 LAB — GLUCOSE, CAPILLARY
Glucose-Capillary: 120 mg/dL — ABNORMAL HIGH (ref 70–99)
Glucose-Capillary: 196 mg/dL — ABNORMAL HIGH (ref 70–99)
Glucose-Capillary: 162 mg/dL — ABNORMAL HIGH (ref 70–99)
Glucose-Capillary: 237 mg/dL — ABNORMAL HIGH (ref 70–99)

## 2018-10-12 NOTE — TOC Initial Note (Signed)
Transition of Care (TOC) - Initial/Assessment Note    Patient Details  Name: Martin [name] Sr. MRN: [id number] Date of Birth: 10/03/1945  Transition of Care [facility]) CM/SW Contact:    Martin [name], [place] Phone Number: 10/12/2018, 4:32 PM  Clinical Narrative:                 CSW alerted that pt denied admission for CIR through [facility]. Due to pt nausea and that he is only currently A&Ox2, CSW reached out to pt wife Martin [name] via telephone [phone]). Introduced self, role, reason for call. Pt wife lives at home with pt, they have a son in [place] and they have other children but they do not live nearby. Pt wife updated that CIR has been denied by [employer]. SNF referral discussed, pt wife extremely concerned with COVID transmission possibility in SNF following procedure and is hesitant about pt referral being made at this time. Martin [name] inquired about home health services and we discussed that home health services are intermittant throughout the week and that she would need to consider if she felt safe and comfortable with him coming home with her.   Pt wife again reminded that we can support pt with discharge to SNF or Home with Home Health (it is her and the patients choice), and will follow up tomorrow based on how pt progresses. Encouraged pt to talk to pt and her son in [place] regarding additional support.   Expected Discharge Plan: Skilled Nursing Facility Barriers to Discharge: Continued Medical Work up, Ship broker   Patient Goals and CMS Choice Patient states their goals for this hospitalization and ongoing recovery are:: for him to feel better (per pt wife)   Choice offered to / list presented to : Spouse  Expected Discharge Plan and Services Expected Discharge Plan: Skilled Nursing Facility In-house Referral: Clinical Social Work Discharge Planning Services: CM Consult Post Acute Care Choice: NA(undecided at this time) Living arrangements  for the past 2 months: Single Family Home                                      Prior Living Arrangements/Services Living arrangements for the past 2 months: Single Family Home Lives with:: Spouse Patient language and need for interpreter reviewed:: Yes(no needs) Do you feel safe going back to the place where you live?: Yes      Need for Family Participation in Patient Care: Yes (Comment)(assistance; supervision) Care giver support system in place?: Yes (comment)(spouse; son in [place]) Current home services: DME Criminal Activity/Legal Involvement Pertinent to Current Situation/Hospitalization: No - Comment as needed  Activities of Daily Living Home Assistive Devices/Equipment: CBG Meter, Shower chair without back ADL Screening (condition at time of admission) Patient's cognitive ability adequate to safely complete daily activities?: Yes Is the patient deaf or have difficulty hearing?: No Does the patient have difficulty seeing, even when wearing glasses/contacts?: No Does the patient have difficulty concentrating, remembering, or making decisions?: No Patient able to express need for assistance with ADLs?: Yes Does the patient have difficulty dressing or bathing?: No Independently performs ADLs?: Yes (appropriate for developmental age) Does the patient have difficulty walking or climbing stairs?: No Weakness of Legs: None Weakness of Arms/Hands: None  Permission Sought/Granted Permission sought to share information with : Family Supports Permission granted to share information with : No(pt oriented only to person and place; nauseous)  Share Information with  NAME: Martin [name] granted to share info w Relationship: wife  Permission granted to share info w Contact Information: [phone]  Emotional Assessment Appearance:: Other (Comment Required(spoke with wife on phone) Attitude/Demeanor/Rapport: (spoke with wife on phone) Affect (typically observed):  (spoke with wife on phone) Orientation: : Oriented to Self, Oriented to Place Alcohol / Substance Use: Not Applicable Psych Involvement: No (comment)  Admission diagnosis:  STEMI involving right coronary artery ([name]) [I21.11] Patient Active Problem List   Diagnosis Date Noted  . Coronary artery disease involving native coronary artery of native heart with unstable angina pectoris ([place])   . 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 STEMI involving right coronary artery ([place]) 09/29/2018  . STEMI (ST elevation myocardial infarction) ([place]) 09/29/2018  . ST elevation myocardial infarction involving right coronary artery ([place])   . Onychomycosis of toenail 02/17/2018  . Allergic rhinitis 08/20/2017  . PAD (peripheral artery disease) ([place]) 08/20/2017  . Partial tear of right Achilles tendon 02/12/2017  . Aortic atherosclerosis ([place]) 10/07/2016  . C. difficile colitis 09/25/2016  . Aspiration pneumonia ([place]) 09/25/2016  . History of skin cancer 08/15/2015  . Undiagnosed cardiac murmurs 08/15/2015  . Diabetic retinopathy ([place]) 02/21/2015  . Former smoker 08/24/2014  . Hyperlipidemia 08/24/2014  . Ingrowing toenail 07/19/2014  . Morbid obesity ([place]) 04/12/2014  . Diabetic polyneuropathy ([place]) 04/11/2009  . BACK PAIN, LUMBAR, WITH RADICULOPATHY 08/30/2008  . DM (diabetes mellitus) type II uncontrolled with eye manifestation ([place]) 02/03/2007  . Hypertension associated with diabetes ([place]) 02/03/2007   PCP:  Martin Olp, MD Pharmacy:   [id number] [place], [place] [address] [place] [address] Phone: [phone] Fax: [phone]     Social Determinants of Health (SDOH) Interventions    Readmission Risk Interventions No flowsheet data found.

## 2018-10-12 NOTE — Progress Notes (Signed)
Spoke with wife about patient. All questions answered. Will monitor patient. Markel Mergenthaler, Bettina Gavia rN

## 2018-10-12 NOTE — Progress Notes (Addendum)
Inpatient Rehab Admissions Coordinator:   Met with patient at bedside.  Note OT saw patient over the weekend and I have opened a case with his insurance for prior authorization.  Will continue to follow.   Shann Medal, PT, DPT Admissions Coordinator [phone] 10/12/18  2:17 PM

## 2018-10-12 NOTE — Progress Notes (Signed)
Patient assisted to chair this AM. Call bell with in reach. Nelda Bucks, Bettina Gavia rN

## 2018-10-12 NOTE — Progress Notes (Signed)
CARDIAC REHAB PHASE I   PRE:  Rate/Rhythm: 66 SR    BP: sitting 128/68, standing 110/88    SaO2: 95 RA  MODE:  Ambulation: 20 ft in hall, sat x2  POST:  Rate/Rhythm: 70 SR    BP: sitting 135/83     SaO2: 98 RA  Pt more alert this am, in recliner. Willing to walk. Stood but c/o dizziness and had to sit again to rest. Walked to door with RW, gait belt, stand by assist x2. Had to sit at door due to fatigue and dizziness. Stood again and walked 20 more feet in hall but insisted on sitting again and this time wanted to be rolled back. Tried to encouraged pt to walk farther in hall but refused. Pt sts he is dizzy. VSS on return to room. Overall pt doing better today, left in recliner.  [place], ACSM 10/12/2018 11:19 AM

## 2018-10-12 NOTE — Progress Notes (Signed)
PT Cancellation Note  Patient Details Name: Martin Kustra [name] MRN: [id number] DOB: October 02, 1945   Cancelled Treatment:    Reason Eval/Treat Not Completed: Medical issues which prohibited therapy. When pt arrived, pt propped forward in chair complaining of extreme nausea. Pt declining working with PT at this time 2 nausea. PT alerted nursing staff. Of note, pt appears slightly confused at this time. Will continue to follow.    Thelma Comp 10/12/2018, 2:41 PM  Rolinda Roan, PT, DPT Acute Rehabilitation Services Pager: [phone] Office: [phone]

## 2018-10-12 NOTE — NC FL2 (Addendum)
[place] LEVEL OF CARE SCREENING TOOL     IDENTIFICATION  Patient Name: Martin MCCONATHY Sr. Birthdate: May 03, 1945 Sex: male Admission Date (Current Location): 09/29/2018  [facility] and [place] Number:  Herbalist and Address:  [facility] [place] [address], [place], [place] [address]      Provider Number: [id number]  Attending Physician Name and Address:  Melrose Nakayama, MD  Relative Name and Phone Number:  Humphrey Guerreiro; wife; [phone]    Current Level of Care: Hospital Recommended Level of Care: [place] Prior Approval Number:    Date Approved/Denied:   PASRR Number: [id number]   Discharge Plan: SNF    Current Diagnoses: Patient Active Problem List   Diagnosis Date Noted  . Coronary artery disease involving native coronary artery of native heart with unstable angina pectoris ([place])   . 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 STEMI involving right coronary artery ([place]) 09/29/2018  . STEMI (ST elevation myocardial infarction) ([place]) 09/29/2018  . ST elevation myocardial infarction involving right coronary artery ([place])   . Onychomycosis of toenail 02/17/2018  . Allergic rhinitis 08/20/2017  . PAD (peripheral artery disease) ([place]) 08/20/2017  . Partial tear of right Achilles tendon 02/12/2017  . Aortic atherosclerosis ([place]) 10/07/2016  . C. difficile colitis 09/25/2016  . Aspiration pneumonia ([place]) 09/25/2016  . History of skin cancer 08/15/2015  . Undiagnosed cardiac murmurs 08/15/2015  . Diabetic retinopathy ([place]) 02/21/2015  . Former smoker 08/24/2014  . Hyperlipidemia 08/24/2014  . Ingrowing toenail 07/19/2014  . Morbid obesity ([place]) 04/12/2014  . Diabetic polyneuropathy ([place]) 04/11/2009  . BACK PAIN, LUMBAR, WITH RADICULOPATHY 08/30/2008  . DM (diabetes mellitus) type II uncontrolled with eye manifestation ([place]) 02/03/2007  . Hypertension associated with diabetes ([place]) 02/03/2007    Orientation  RESPIRATION BLADDER Height & Weight     Self, Place  Normal Incontinent, External catheter Weight: 285 lb 4.4 oz (129.4 kg) Height:  6' (182.9 cm)  BEHAVIORAL SYMPTOMS/MOOD NEUROLOGICAL BOWEL NUTRITION STATUS      Continent Diet(see discharge summary)  AMBULATORY STATUS COMMUNICATION OF NEEDS Skin   Extensive Assist(sternal precautions) Verbally Surgical wounds, Other (Comment)(surgical incisions on chest and right leg with twice daily dressing changes; generalized ecchymosis on neck and arms)                       Personal Care Assistance Level of Assistance  Bathing, Dressing, Feeding Bathing Assistance: Maximum assistance(sternal precautions) Feeding assistance: Limited assistance(sternal precautions) Dressing Assistance: Maximum assistance(sternal precautions)     Functional Limitations Info  Sight, Hearing, Speech Sight Info: Impaired Hearing Info: Adequate Speech Info: Adequate    SPECIAL CARE FACTORS FREQUENCY  OT (By licensed OT), PT (By licensed PT)     PT Frequency: 5x week OT Frequency: 5x week            Contractures Contractures Info: Not present    Additional Factors Info  Code Status, Allergies, Insulin Sliding Scale, Psychotropic Code Status Info: Full Code Allergies Info: Simvastatin Psychotropic Info: mirtazapine (REMERON SOL-TAB) disintegrating tablet 15 mg daily at bedtime PO Insulin Sliding Scale Info: insulin aspart (novoLOG) injection 0-20 Units 3x daily with meals; insulin aspart (novoLOG) injection 25 Units 3x daily with meals;insulin detemir (LEVEMIR) injection 60 Units 2x daily       Current Medications (10/12/2018):  This is the current hospital active medication list Current Facility-Administered Medications  Medication Dose Route Frequency Provider Last Rate Last Dose  .  0.9 %  sodium chloride infusion  250 mL Intravenous PRN Melrose Nakayama, MD      . alum & mag hydroxide-simeth (MAALOX/MYLANTA) 200-200-20 MG/5ML suspension 15  mL  15 mL Oral Q6H PRN Melrose Nakayama, MD      . amiodarone (PACERONE) tablet 400 mg  400 mg Oral BID Melrose Nakayama, MD   400 mg at 10/12/18 1005  . amLODipine (NORVASC) tablet 5 mg  5 mg Oral Daily Gold, Wayne E, PA-C   5 mg at 10/12/18 1006  . aspirin EC tablet 325 mg  325 mg Oral Daily Melrose Nakayama, MD   325 mg at 10/12/18 1005   Or  . aspirin chewable tablet 324 mg  324 mg Per Tube Daily Melrose Nakayama, MD   324 mg at 10/08/18 0852  . bisacodyl (DULCOLAX) EC tablet 10 mg  10 mg Oral Daily Melrose Nakayama, MD   10 mg at 10/12/18 1005   Or  . bisacodyl (DULCOLAX) suppository 10 mg  10 mg Rectal Daily Melrose Nakayama, MD      . brimonidine (ALPHAGAN) 0.2 % ophthalmic solution 1 drop  1 drop Left Eye BID Melrose Nakayama, MD   1 drop at 10/12/18 1009  . docusate sodium (COLACE) capsule 200 mg  200 mg Oral Daily Melrose Nakayama, MD   200 mg at 10/12/18 1006  . dorzolamide (TRUSOPT) 2 % ophthalmic solution 1 drop  1 drop Left Eye TID Melrose Nakayama, MD   1 drop at 10/12/18 1005  . enoxaparin (LOVENOX) injection 40 mg  40 mg Subcutaneous QHS Melrose Nakayama, MD   40 mg at 10/11/18 2125  . furosemide (LASIX) tablet 40 mg  40 mg Oral Daily Gold, Wayne E, PA-C   40 mg at 10/12/18 1006  . guaiFENesin (MUCINEX) 12 hr tablet 1,200 mg  1,200 mg Oral BID Harriet Pho, Tessa N, PA-C   1,200 mg at 10/12/18 1006  . hydrochlorothiazide (HYDRODIURIL) tablet 25 mg  25 mg Oral Daily Melrose Nakayama, MD   25 mg at 10/12/18 1006  . insulin aspart (novoLOG) injection 0-20 Units  0-20 Units Subcutaneous TID WC Melrose Nakayama, MD   4 Units at 10/12/18 1303  . insulin aspart (novoLOG) injection 25 Units  25 Units Subcutaneous TID WC Melrose Nakayama, MD   25 Units at 10/10/18 [phone]  . insulin detemir (LEVEMIR) injection 60 Units  60 Units Subcutaneous BID Elgie Collard, [place]   60 Units at 10/12/18 1005  . lactulose (CHRONULAC) 10 GM/15ML  solution 20 g  20 g Oral Daily PRN Melrose Nakayama, MD      . levalbuterol Penne Lash) nebulizer solution 0.63 mg  0.63 mg Nebulization Q6H PRN Melrose Nakayama, MD   0.63 mg at 10/08/18 2029  . lidocaine (PF) (XYLOCAINE) 1 % injection   Infiltration PRN Louk, Alexandra M, PA-C   10 mL at 10/06/18 1418  . MEDLINE mouth rinse  15 mL Mouth Rinse BID Melrose Nakayama, MD   15 mL at 10/12/18 1013  . metoprolol tartrate (LOPRESSOR) tablet 12.5 mg  12.5 mg Oral BID Melrose Nakayama, MD   12.5 mg at 10/12/18 1006   Or  . metoprolol tartrate (LOPRESSOR) 25 mg/10 mL oral suspension 12.5 mg  12.5 mg Per Tube BID Melrose Nakayama, MD   12.5 mg at 10/07/18 2255  . mirtazapine (REMERON SOL-TAB) disintegrating tablet 15 mg  15 mg Oral QHS Hendrickson,  Revonda Standard, MD   15 mg at 10/11/18 2126  . ondansetron (ZOFRAN) injection 4 mg  4 mg Intravenous Q6H PRN Melrose Nakayama, MD   4 mg at 10/12/18 1453  . oxyCODONE (Oxy IR/ROXICODONE) immediate release tablet 5-10 mg  5-10 mg Oral Q3H PRN Melrose Nakayama, MD   10 mg at 10/09/18 1800  . pantoprazole (PROTONIX) EC tablet 40 mg  40 mg Oral Daily Melrose Nakayama, MD   40 mg at 10/12/18 1006  . polyethylene glycol (MIRALAX / GLYCOLAX) packet 17 g  17 g Oral Daily PRN Melrose Nakayama, MD      . potassium chloride (K-DUR) CR tablet 10 mEq  10 mEq Oral BID Gold, Wayne E, PA-C   10 mEq at 10/12/18 1006  . rosuvastatin (CRESTOR) tablet 40 mg  40 mg Oral q1800 Melrose Nakayama, MD   40 mg at 10/11/18 1754  . sodium chloride flush (NS) 0.9 % injection 3 mL  3 mL Intravenous Q12H Melrose Nakayama, MD   3 mL at 10/12/18 1014  . sodium chloride flush (NS) 0.9 % injection 3 mL  3 mL Intravenous PRN Melrose Nakayama, MD   3 mL at 10/08/18 2205  . tamsulosin (FLOMAX) capsule 0.4 mg  0.4 mg Oral Daily Melrose Nakayama, MD   0.4 mg at 10/12/18 1005  . traMADol (ULTRAM) tablet 50-100 mg  50-100 mg Oral Q4H PRN Melrose Nakayama, MD   100 mg at 10/12/18 1009     Discharge Medications: Please see discharge summary for a list of discharge medications.  Relevant Imaging Results:  Relevant Lab Results:   Additional Information [id number] [place], [place]

## 2018-10-12 NOTE — Progress Notes (Signed)
Patient Wife called and updated all questions answered. Will monitor patient. Lebert Lovern, Bettina Gavia rN

## 2018-10-12 NOTE — Progress Notes (Signed)
11 Days Post-Op Procedure(s) (LRB): CORONARY ARTERY BYPASS GRAFTING (CABG) TIMES  FOUR USING LEFT MAMMARY ARTERY AND RIGHT GREATER SAPHEANOUS VEIN HARVESTED ENDOSCOPICALLY (N/A) TRANSESOPHAGEAL ECHOCARDIOGRAM (TEE) (N/A) Subjective: Feels better today  Objective: Vital signs in last 24 hours: Temp:  [96.3 F (35.7 C)-97.8 F (36.6 C)] 96.3 F (35.7 C) (06/15 0415) Pulse Rate:  [59-74] 72 (06/15 0723) Cardiac Rhythm: Normal sinus rhythm (06/14 1900) Resp:  [16-24] 24 (06/15 0723) BP: (110-131)/(58-74) 129/58 (06/15 0723) SpO2:  [94 %-98 %] 95 % (06/15 0723) Weight:  [129.4 kg] 129.4 kg (06/15 0415)  Hemodynamic parameters for last 24 hours:    Intake/Output from previous day: 06/14 0701 - 06/15 0700 In: 360 [P.O.:360] Out: 1625 [Urine:1625] Intake/Output this shift: No intake/output data recorded.  General appearance: alert, cooperative and no distress Neurologic: intact Heart: regular rate and rhythm Lungs: diminished breath sounds bibasilar Abdomen: normal findings: soft, non-tender Wound: clean and dry  Lab Results: Recent Labs    10/10/18 0312  WBC 12.9*  HGB 9.0*  HCT 27.4*  PLT 394   BMET:  Recent Labs    10/10/18 0312  NA 128*  K 4.9  CL 88*  CO2 29  GLUCOSE 244*  BUN 38*  CREATININE 2.06*  CALCIUM 8.8*    PT/INR: No results for input(s): LABPROT, INR in the last 72 hours. ABG    Component Value Date/Time   PHART 7.343 (L) 10/01/2018 1946   HCO3 21.5 10/01/2018 1946   TCO2 22 10/01/2018 2009   ACIDBASEDEF 4.0 (H) 10/01/2018 1946   O2SAT 95.0 10/01/2018 1946   CBG (last 3)  Recent Labs    10/11/18 1648 10/11/18 2105 10/12/18 0621  GLUCAP 203* 237* 120*    Assessment/Plan: S/P Procedure(s) (LRB): CORONARY ARTERY BYPASS GRAFTING (CABG) TIMES  FOUR USING LEFT MAMMARY ARTERY AND RIGHT GREATER SAPHEANOUS VEIN HARVESTED ENDOSCOPICALLY (N/A) TRANSESOPHAGEAL ECHOCARDIOGRAM (TEE) (N/A) -Continues to make slow progress CBG remain difficult  to control Has been seen by OT- await insurance approval for inpatient rehab Continue PT/OT Recheck creatinine tomorrow AM   LOS: 13 days    Melrose Nakayama 10/12/2018

## 2018-10-12 NOTE — Progress Notes (Signed)
Patient with complaints of nausea. Patient given zofran as ordered as needed for nausea. Will monitor patient.Leira Regino, Bettina Gavia rN

## 2018-10-12 NOTE — Progress Notes (Addendum)
   Feels better.  No AF.  Creat elevated ~ 2.1  May need to hold diuretic if further increase tomorrow.

## 2018-10-12 NOTE — Progress Notes (Signed)
Inpatient Rehab Admissions Coordinator:   I received notification that [employer] has denied authorization for CIR.  I will let pt and CM know.   Shann Medal, PT, DPT Admissions Coordinator [phone] 10/12/18  4:01 PM

## 2018-10-13 DIAGNOSIS — F329 Major depressive disorder, single episode, unspecified: Secondary | ICD-10-CM

## 2018-10-13 DIAGNOSIS — I480 Paroxysmal atrial fibrillation: Secondary | ICD-10-CM

## 2018-10-13 LAB — BASIC METABOLIC PANEL
Anion gap: 11 (ref 5–15)
BUN: 41 mg/dL — ABNORMAL HIGH (ref 8–23)
CO2: 30 mmol/L (ref 22–32)
Calcium: 8.7 mg/dL — ABNORMAL LOW (ref 8.9–10.3)
Chloride: 86 mmol/L — ABNORMAL LOW (ref 98–111)
Creatinine, Ser: 2.08 mg/dL — ABNORMAL HIGH (ref 0.61–1.24)
GFR calc Af Amer: 35 mL/min — ABNORMAL LOW (ref >60)
GFR calc non Af Amer: 30 mL/min — ABNORMAL LOW (ref >60)
Glucose, Bld: 149 mg/dL — ABNORMAL HIGH (ref 70–99)
Potassium: 4.6 mmol/L (ref 3.5–5.1)
Sodium: 127 mmol/L — ABNORMAL LOW (ref 135–145)

## 2018-10-13 LAB — GLUCOSE, CAPILLARY
Glucose-Capillary: 98 mg/dL (ref 70–99)
Glucose-Capillary: 145 mg/dL — ABNORMAL HIGH (ref 70–99)
Glucose-Capillary: 151 mg/dL — ABNORMAL HIGH (ref 70–99)
Glucose-Capillary: 129 mg/dL — ABNORMAL HIGH (ref 70–99)

## 2018-10-13 MED ORDER — ALPRAZOLAM 0.25 MG PO TABS
0.2500 mg | ORAL_TABLET | Freq: Two times a day (BID) | ORAL | Status: DC
  Administered 2018-10-13 – 2018-10-14 (×3): 0.25 mg via ORAL
  Filled 2018-10-13 (×3): qty 1

## 2018-10-13 MED ORDER — GLUCERNA SHAKE PO LIQD
237.0000 mL | Freq: Three times a day (TID) | ORAL | Status: DC
  Administered 2018-10-13 – 2018-10-19 (×17): 237 mL via ORAL

## 2018-10-13 MED ORDER — ADULT MULTIVITAMIN W/MINERALS CH
1.0000 | ORAL_TABLET | Freq: Every day | ORAL | Status: DC
  Administered 2018-10-14 – 2018-10-19 (×6): 1 via ORAL
  Filled 2018-10-13 (×6): qty 1

## 2018-10-13 MED ORDER — AMIODARONE HCL 200 MG PO TABS
200.0000 mg | ORAL_TABLET | Freq: Two times a day (BID) | ORAL | Status: DC
  Administered 2018-10-13 – 2018-10-15 (×5): 200 mg via ORAL
  Filled 2018-10-13 (×5): qty 1

## 2018-10-13 NOTE — Progress Notes (Signed)
Pt expresses that his health has been declining for about 5 years and has not been able to get around much.  Pt states he is worried about his health in general and if he will ever be able to get back to doing things, his family, everything.  Spouse updated.

## 2018-10-13 NOTE — Progress Notes (Signed)
PT Cancellation Note  Patient Details Name: Martin [name] [facility] Sr. MRN: [id number] DOB: Jul 26, 1945   Cancelled Treatment:    Reason Eval/Treat Not Completed: Patient declined, no reason specified.  Pt had no good reason.    10/13/2018  Donnella Sham, [place] [phone]  (pager) [phone]  (office)   Tessie Fass Belia Febo 10/13/2018, 2:45 PM

## 2018-10-13 NOTE — Care Management Important Message (Signed)
Important Message  Patient Details  Name: Martin [name] [facility] Sr. MRN: [id number] Date of Birth: [birth year]   Medicare Important Message Given:  Yes    [name] 10/13/2018, 12:54 PM

## 2018-10-13 NOTE — Progress Notes (Addendum)
[address]       [place] [address]             [phone]      12 Days Post-Op Procedure(s) (LRB): CORONARY ARTERY BYPASS GRAFTING (CABG) TIMES  FOUR USING LEFT MAMMARY ARTERY AND RIGHT GREATER SAPHEANOUS VEIN HARVESTED ENDOSCOPICALLY (N/A) TRANSESOPHAGEAL ECHOCARDIOGRAM (TEE) (N/A) Subjective: Slowly improving, ambulating with assistance with cardiac rehab currently feeling like he is making some progress Feels anxious at times  Objective: Vital signs in last 24 hours: Temp:  [97.5 F (36.4 C)-97.6 F (36.4 C)] 97.6 F (36.4 C) (06/16 0429) Pulse Rate:  [62-74] 62 (06/16 0429) Cardiac Rhythm: Normal sinus rhythm (06/16 0320) Resp:  [17-26] 19 (06/16 0429) BP: (110-136)/(68-88) 136/68 (06/15 2112) SpO2:  [93 %-95 %] 93 % (06/16 0429) Weight:  [128.3 kg] 128.3 kg (06/16 0429)  Hemodynamic parameters for last 24 hours:    Intake/Output from previous day: 06/15 0701 - 06/16 0700 In: 120 [P.O.:120] Out: 1475 [Urine:1475] Intake/Output this shift: No intake/output data recorded.  General appearance: alert, cooperative and no distress Heart: regular rate and rhythm Lungs: mildly dim in bases Abdomen: soft, nontender Extremities: + BLE edema Wound: incis healing well  Lab Results: No results for input(s): WBC, HGB, HCT, PLT in the last 72 hours. BMET:  Recent Labs    10/13/18 0355  NA 127*  K 4.6  CL 86*  CO2 30  GLUCOSE 149*  BUN 41*  CREATININE 2.08*  CALCIUM 8.7*    PT/INR: No results for input(s): LABPROT, INR in the last 72 hours. ABG    Component Value Date/Time   PHART 7.343 (L) 10/01/2018 1946   HCO3 21.5 10/01/2018 1946   TCO2 22 10/01/2018 2009   ACIDBASEDEF 4.0 (H) 10/01/2018 1946   O2SAT 95.0 10/01/2018 1946   CBG (last 3)  Recent Labs    10/12/18 1645 10/12/18 2156 10/13/18 0646  GLUCAP 162* 237* 98    Meds Scheduled Meds: . amiodarone  400 mg Oral BID  . amLODipine  5 mg Oral Daily  . aspirin EC  325  mg Oral Daily   Or  . aspirin  324 mg Per Tube Daily  . bisacodyl  10 mg Oral Daily   Or  . bisacodyl  10 mg Rectal Daily  . brimonidine  1 drop Left Eye BID  . docusate sodium  200 mg Oral Daily  . dorzolamide  1 drop Left Eye TID  . enoxaparin (LOVENOX) injection  40 mg Subcutaneous QHS  . furosemide  40 mg Oral Daily  . guaiFENesin  1,200 mg Oral BID  . hydrochlorothiazide  25 mg Oral Daily  . insulin aspart  0-20 Units Subcutaneous TID WC  . insulin aspart  25 Units Subcutaneous TID WC  . insulin detemir  60 Units Subcutaneous BID  . mouth rinse  15 mL Mouth Rinse BID  . metoprolol tartrate  12.5 mg Oral BID   Or  . metoprolol tartrate  12.5 mg Per Tube BID  . mirtazapine  15 mg Oral QHS  . pantoprazole  40 mg Oral Daily  . potassium chloride  10 mEq Oral BID  . rosuvastatin  40 mg Oral q1800  . sodium chloride flush  3 mL Intravenous Q12H  . tamsulosin  0.4 mg Oral Daily   Continuous Infusions: . sodium chloride     PRN Meds:.sodium chloride, alum & mag hydroxide-simeth, lactulose, levalbuterol, lidocaine (PF), ondansetron (ZOFRAN) IV, oxyCODONE, polyethylene glycol, sodium  chloride flush, traMADol  Xrays No results found.  Assessment/Plan: S/P Procedure(s) (LRB): CORONARY ARTERY BYPASS GRAFTING (CABG) TIMES  FOUR USING LEFT MAMMARY ARTERY AND RIGHT GREATER SAPHEANOUS VEIN HARVESTED ENDOSCOPICALLY (N/A) TRANSESOPHAGEAL ECHOCARDIOGRAM (TEE) (N/A)  1 remains hemodyn stable in sinus rhythm, Afebrile- will reduce amio to 200 BID 2 sats ok on RA 3 slow progress with rehab, not a CIR candidate ccurrently, looking into SNF placement 4 renal fxn stable on current RX- adeq UOP 5 BS management difficult, some nausea and poor appetite contributing 6 cont therapies/pulm toilet     LOS: 14 days    John Giovanni PA-C 10/13/2018 Pager [phone]  Patient seen and examined, agree with above Unfortunately insurance turned down inpatient rehab, which would have benefitted  him tremendously Although deconditioning is the biggest factor, I think there is some anxiety component to his difficulties ambulating, will try low dose xanax  Remo Lipps C. Roxan Hockey, MD [facility] [phone]

## 2018-10-13 NOTE — Progress Notes (Signed)
Initial Nutrition Assessment  DOCUMENTATION CODES:   Obesity unspecified  INTERVENTION:    Glucerna Shake po TID, each supplement provides 220 kcal and 10 grams of protein  MVI daily  NUTRITION DIAGNOSIS:   Increased nutrient needs related to post-op healing as evidenced by estimated needs.  GOAL:   Patient will meet greater than or equal to 90% of their needs  MONITOR:   PO intake, Supplement acceptance, Weight trends, Labs, I & O's, Skin  REASON FOR ASSESSMENT:   LOS    ASSESSMENT:   Patient with PMH significant for DM and HTN. Presents this admission with STEMI. 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   6/4- CABG x 4 6/9- L thoracentesis- 600 ml drained   Pt has not had BM in 5 days. RN gave bowel regimen this afternoon. Pt working on results at time of RD visit.   Spoke with RN outside of room. Pt has not been eating well due to what RN suspects is depression. States his wife shared that he wasn't eating or getting up PTA. Meal completions charted as 0-50% for pt's last eight meals. RD reports pt likes Glucerna and will drink as many as they give him. RD to order.   Per chart records weight noted to fluctuate between 128- 135 kg over the last year. Will need to obtain history to assess for dry weight loss if possible.   Admission weight 133.8 kg Current weight 128.3 kg - pt continues to diurese   I/O: -5,9001 ml since admit UOP: 1,475 ml x 24 hrs   Medications: amiodarone, dulcoloax, colace, 40 mg lasix daily, SS novolog, levemir, remeron, 10 mEq KCl BID Labs: Na 127 (L) CBG 98-237  Diet Order:   Diet Order            Diet heart healthy/carb modified Room service appropriate? Yes with Assist; Fluid consistency: Thin  Diet effective now              EDUCATION NEEDS:   Not appropriate for education at this time  Skin:  Skin Assessment: Skin Integrity Issues: Skin Integrity Issues:: Incisions Incisions: chest, R leg  Last BM:  6/10  Height:   Ht  Readings from Last 1 Encounters:  10/03/18 6' (1.829 m)    Weight:   Wt Readings from Last 1 Encounters:  10/13/18 128.3 kg    Ideal Body Weight:  80.9 kg  BMI:  Body mass index is 38.36 kg/m.  Estimated Nutritional Needs:   Kcal:  2200-2400 kcal  Protein:  110-125 grams  Fluid:  >/= 2.2 L/day   Mariana Single RD, LDN Clinical Nutrition Pager # - [phone]

## 2018-10-13 NOTE — Progress Notes (Signed)
Progress Note  Patient Name: Martin [name] [facility] Sr. Date of Encounter: 10/13/2018  Primary Cardiologist:  Ellouise Newer  Subjective   Very anxious. Nausea better.  Inpatient Medications    Scheduled Meds: . ALPRAZolam  0.25 mg Oral BID  . amiodarone  200 mg Oral BID  . amLODipine  5 mg Oral Daily  . aspirin EC  325 mg Oral Daily   Or  . aspirin  324 mg Per Tube Daily  . bisacodyl  10 mg Oral Daily   Or  . bisacodyl  10 mg Rectal Daily  . brimonidine  1 drop Left Eye BID  . docusate sodium  200 mg Oral Daily  . dorzolamide  1 drop Left Eye TID  . enoxaparin (LOVENOX) injection  40 mg Subcutaneous QHS  . furosemide  40 mg Oral Daily  . guaiFENesin  1,200 mg Oral BID  . hydrochlorothiazide  25 mg Oral Daily  . insulin aspart  0-20 Units Subcutaneous TID WC  . insulin aspart  25 Units Subcutaneous TID WC  . insulin detemir  60 Units Subcutaneous BID  . mouth rinse  15 mL Mouth Rinse BID  . metoprolol tartrate  12.5 mg Oral BID   Or  . metoprolol tartrate  12.5 mg Per Tube BID  . mirtazapine  15 mg Oral QHS  . pantoprazole  40 mg Oral Daily  . potassium chloride  10 mEq Oral BID  . rosuvastatin  40 mg Oral q1800  . sodium chloride flush  3 mL Intravenous Q12H  . tamsulosin  0.4 mg Oral Daily   Continuous Infusions: . sodium chloride     PRN Meds: sodium chloride, alum & mag hydroxide-simeth, lactulose, levalbuterol, lidocaine (PF), ondansetron (ZOFRAN) IV, oxyCODONE, polyethylene glycol, sodium chloride flush, traMADol   Vital Signs    Vitals:   10/12/18 1721 10/12/18 2112 10/13/18 0429 10/13/18 1005  BP: 118/70 136/68  121/67  Pulse: 70  62   Resp: 19 17 19    Temp: (!) 97.5 F (36.4 C) (!) 97.5 F (36.4 C) 97.6 F (36.4 C)   TempSrc: Oral Oral Oral   SpO2: 95% 94% 93%   Weight:   128.3 kg   Height:        Intake/Output Summary (Last 24 hours) at 10/13/2018 1125 Last data filed at 10/13/2018 0900 Gross per 24 hour  Intake 237 ml  Output 1475 ml  Net -1238  ml   Last 3 Weights 10/13/2018 10/12/2018 10/11/2018  Weight (lbs) 282 lb 13.6 oz 285 lb 4.4 oz 286 lb 9.6 oz  Weight (kg) 128.3 kg 129.4 kg 130 kg      Telemetry    NSR - Personally Reviewed  ECG    No new  - Personally Reviewed  Physical Exam  Obese GEN: No acute distress.   Neck: No JVD Cardiac: RRR, no murmurs, rubs, or gallops. Sternal wound okay. Respiratory: decreased basilar sounds. GI: Soft, nontender, non-distended  MS: No edema; No deformity. Neuro:  Nonfocal  Psych: Normal affect   Labs    Chemistry Recent Labs  Lab 10/07/18 0653  10/09/18 0330 10/10/18 0312 10/13/18 0355  NA 137   < > 128* 128* 127*  K 4.5   < > 4.7 4.9 4.6  CL 95*   < > 91* 88* 86*  CO2 28   < > 26 29 30   GLUCOSE 56*   < > 219* 244* 149*  BUN 42*   < > 40* 38* 41*  CREATININE  1.89*   < > 1.98* 2.06* 2.08*  CALCIUM 8.9   < > 8.3* 8.8* 8.7*  PROT 7.2  --   --   --   --   ALBUMIN 3.2*  --   --   --   --   AST 55*  --   --   --   --   ALT 58*  --   --   --   --   ALKPHOS 120  --   --   --   --   BILITOT 1.1  --   --   --   --   GFRNONAA 34*   < > 32* 31* 30*  GFRAA 40*   < > 37* 36* 35*  ANIONGAP 14   < > 11 11 11    < > = values in this interval not displayed.     Hematology Recent Labs  Lab 10/07/18 0653 10/08/18 0249 10/10/18 0312  WBC 13.2* 10.5 12.9*  RBC 2.95* 2.64* 3.10*  HGB 8.9* 7.7* 9.0*  HCT 26.7* 24.3* 27.4*  MCV 90.5 92.0 88.4  MCH 30.2 29.2 29.0  MCHC 33.3 31.7 32.8  RDW 14.7 14.6 14.1  PLT 384 335 394    Cardiac EnzymesNo results for input(s): TROPONINI in the last 168 hours. No results for input(s): TROPIPOC in the last 168 hours.   BNPNo results for input(s): BNP, PROBNP in the last 168 hours.   DDimer No results for input(s): DDIMER in the last 168 hours.   Radiology    No results found.  Cardiac Studies   No new.  Patient Profile     73 y.o. male with history of DM, HTN, tobacco abuse admitted with inferior STEMI. Cardiac cath 09/29/18 with  severe three vessel CAD and thrombus in the RCA with 90% occlusion of the vessel. CABG with LIMA to LAD / SVG to D1 / SVG Y to PDA / PL. Post op AF and anxiety depression.  Assessment & Plan    1. Status post multivessel CABG for three-vessel native CAD.  Excellent result without recurrent angina. 2. Postoperative atrial fibrillation, now on amiodarone and stable. 3. Amiodarone therapy, now decreased in dose and with resolution of nausea. 4. Postoperative anxiety/depression, identified the patient's concerns, discussed prognosis and gradual improvement.  Also made it known that there are medications that can help if depression becomes overbearing. 5. CKD stage III: Stable kidney function with creatinine of 2.08.       For questions or updates, please contact [place] Please consult [email or web address] for contact info under        Signed, Sinclair Grooms, MD  10/13/2018, 11:25 AM

## 2018-10-13 NOTE — Progress Notes (Signed)
Occupational Therapy Treatment Patient Details Name: [name] MRN: [id number] DOB: 1946/04/24 Today's Date: 10/13/2018    History of present illness [name] is a 73yo M with a prior hx of DM2, tobacco use and HTN who presented to [facility] on 09/29/2018 with STEMI found to have three vessel CAD>>TCTS consult.  On 6/4, pt s/p CABG x3   OT comments  Patient in recliner upon therapy arrival and agreeable to participate in OT treatment session although limited due to feeling of nausea. OT provided patient with sternal precautions handout. Reviewed precautions with examples provided. Discussed 4 P's with examples provided for carry over at home. At end of education, teach back method was used to ensure understanding. Patient was able to verbalize one technique he was educated on that he knew he could implement right away (bed mobility). Per patient's chart, insurance has denied CIR placement. Updated discharge recommendation to SNF at this time as patient continues to require physical assistance to complete basic ADL tasks and functional transfers. Pt was left in recliner with call light and chair alarm on. OT will continue to follow patient acutely.    Follow Up Recommendations  SNF          Precautions / Restrictions Precautions Precautions: Sternal Precaution Booklet Issued: No Precaution Comments: Pt provided with sternal precautions handout. Reviewed and discussed.              ADL either performed or assessed with clinical judgement        Vision Baseline Vision/History: No visual deficits Patient Visual Report: No change from baseline            Cognition Arousal/Alertness: Awake/alert Behavior During Therapy: Flat affect Overall Cognitive Status: Impaired/Different from baseline Area of Impairment: Attention;Memory;Problem solving;Awareness       General Comments: Pt with slow processing during session. Unknown if this is related to being [facility]. Pt did ask  appropriate questions although requested questions to be repeated.        Exercises Other Exercises Other Exercises: Sternal precautions handout provided. Reviewed and discussed with patient.           Pertinent Vitals/ Pain       Pain Assessment: No/denies pain Faces Pain Scale: No hurt         Frequency  Min 2X/week        Progress Toward Goals  OT Goals(current goals can now be found in the care plan section)  Progress towards OT goals: Progressing toward goals     Plan Discharge plan needs to be updated;Frequency remains appropriate(Insurance has denied CIR.)       AM-PAC OT "6 Clicks" Daily Activity     Outcome Measure   Help from another person eating meals?: None Help from another person taking care of personal grooming?: A Little Help from another person toileting, which includes using toliet, bedpan, or urinal?: A Lot Help from another person bathing (including washing, rinsing, drying)?: A Lot Help from another person to put on and taking off regular upper body clothing?: A Little Help from another person to put on and taking off regular lower body clothing?: A Lot 6 Click Score: 16    End of Session    OT Visit Diagnosis: Other abnormalities of gait and mobility (R26.89);Muscle weakness (generalized) (M62.81);Pain;Other symptoms and signs involving cognitive function   Activity Tolerance Other (comment)(treatment limited by nausea)   Patient Left in chair;with call bell/phone within reach;with chair alarm set  Time: 2241-1464 OT Time Calculation (min): 14 min  Charges: OT General Charges $OT Visit: 1 Visit OT Treatments $Self Care/Home Management : 8-22 mins([place], OTR/L,CBIS  [phone]    Kemar Pandit, Clarene Duke 10/13/2018, 11:59 AM

## 2018-10-13 NOTE — Progress Notes (Signed)
CARDIAC REHAB PHASE I   PRE:  Rate/Rhythm: 62 SR    BP: sitting 138/62    SaO2: 93 RA  MODE:  Ambulation: 50 ft   POST:  Rate/Rhythm: 78 SR    BP: sitting 164/74     SaO2: 96 RA  On arrival pt alert and ready to walk. Asked to wear his tennis shoes, which we donned. Upon sitting on EOB pt voices that he just doesn't understand why he becomes anxious with activity. Sts this has been a lot on him. . . heart attack, OHS, his trouble hearing what people are saying. We discussed how this body is responding physically to the stress he has been through. We discussed using pursed lip breathing to help his body respond better with activity. He was very receptive although sat on EOB for 7-8 min "preparing".   He stood with min assist and gait belt. He walked with RW, slow and steady. He stood and rested briefly x2, practicing pursed lip breathing, before he requested to sit and rest in hall at 25 ft. Initially he was reluctant to walk again but after discussing, he was able to stand and walk another 25 ft stent before he requested to roll back to room in recliner. We encouraged pt as he is progressing and that controlling his breathing and anxiety helps him to progress with his activity. VSS in recliner. Had pt practice IS and flutter. Productive with phlegm. Gave instructions for walking later with PT and using IS and flutter valve. Pt receptive.  [place], ACSM 10/13/2018 8:39 AM

## 2018-10-14 LAB — GLUCOSE, CAPILLARY
Glucose-Capillary: 76 mg/dL (ref 70–99)
Glucose-Capillary: 103 mg/dL — ABNORMAL HIGH (ref 70–99)
Glucose-Capillary: 139 mg/dL — ABNORMAL HIGH (ref 70–99)
Glucose-Capillary: 216 mg/dL — ABNORMAL HIGH (ref 70–99)

## 2018-10-14 MED ORDER — ALPRAZOLAM 0.25 MG PO TABS
0.2500 mg | ORAL_TABLET | Freq: Two times a day (BID) | ORAL | Status: DC | PRN
  Administered 2018-10-14 – 2018-10-19 (×10): 0.25 mg via ORAL
  Filled 2018-10-14 (×10): qty 1

## 2018-10-14 NOTE — Plan of Care (Addendum)
Needs frequent encouragement to participate in care. Patient is withdrawn and has questioned his ability to recover from surgery. Spoke to wife about the day and she has expressed her frustration about patient's lack of motivation and unwillingness to eat. Patient has stated several times during the day that he is too tired to talk to family. Has a persistent cough and uses [employer] IS encourage and patient does use when asked, but not motivation to use on own.

## 2018-10-14 NOTE — Progress Notes (Addendum)
The patient has been seen in conjunction with Reino Bellis, NP. All aspects of care have been considered and discussed. The patient has been personally interviewed, examined, and all clinical data has been reviewed.   Stable and with acute kidney injury, plateau of creat..  Agree hold diuretics for now.    Progress Note  Patient Name: Martin Cregger [facility] Sr. Date of Encounter: 10/14/2018  Primary Cardiologist: No primary care provider on file.   Subjective   Actually working with CR this morning, getting up into the chair.   Inpatient Medications    Scheduled Meds: . ALPRAZolam  0.25 mg Oral BID  . amiodarone  200 mg Oral BID  . amLODipine  5 mg Oral Daily  . aspirin EC  325 mg Oral Daily   Or  . aspirin  324 mg Per Tube Daily  . bisacodyl  10 mg Oral Daily   Or  . bisacodyl  10 mg Rectal Daily  . brimonidine  1 drop Left Eye BID  . docusate sodium  200 mg Oral Daily  . dorzolamide  1 drop Left Eye TID  . enoxaparin (LOVENOX) injection  40 mg Subcutaneous QHS  . feeding supplement (GLUCERNA SHAKE)  237 mL Oral TID BM  . furosemide  40 mg Oral Daily  . guaiFENesin  1,200 mg Oral BID  . hydrochlorothiazide  25 mg Oral Daily  . insulin aspart  0-20 Units Subcutaneous TID WC  . insulin aspart  25 Units Subcutaneous TID WC  . insulin detemir  60 Units Subcutaneous BID  . mouth rinse  15 mL Mouth Rinse BID  . metoprolol tartrate  12.5 mg Oral BID   Or  . metoprolol tartrate  12.5 mg Per Tube BID  . mirtazapine  15 mg Oral QHS  . multivitamin with minerals  1 tablet Oral Daily  . pantoprazole  40 mg Oral Daily  . potassium chloride  10 mEq Oral BID  . rosuvastatin  40 mg Oral q1800  . sodium chloride flush  3 mL Intravenous Q12H  . tamsulosin  0.4 mg Oral Daily   Continuous Infusions: . sodium chloride     PRN Meds: sodium chloride, alum & mag hydroxide-simeth, lactulose, levalbuterol, lidocaine (PF), ondansetron (ZOFRAN) IV, oxyCODONE, polyethylene glycol, sodium  chloride flush, traMADol   Vital Signs    Vitals:   10/13/18 1936 10/14/18 0300 10/14/18 0500 10/14/18 1010  BP: (!) 126/52 (!) 123/94  (!) 126/56  Pulse: 73 71 69 71  Resp: (!) 21  18   Temp: 97.9 F (36.6 C) 97.9 F (36.6 C)    TempSrc: Oral Oral    SpO2: 92% 92% 92%   Weight:   126.2 kg   Height:        Intake/Output Summary (Last 24 hours) at 10/14/2018 1023 Last data filed at 10/13/2018 1942 Gross per 24 hour  Intake 0 ml  Output 700 ml  Net -700 ml   Last 3 Weights 10/14/2018 10/13/2018 10/12/2018  Weight (lbs) 278 lb 3.5 oz 282 lb 13.6 oz 285 lb 4.4 oz  Weight (kg) 126.2 kg 128.3 kg 129.4 kg      Telemetry    SR - Personally Reviewed  ECG    N/a - Personally Reviewed  Physical Exam   GEN: No acute distress. Obese older WM Neck: No JVD Cardiac: RRR, no murmurs, rubs, or gallops.  Respiratory: Clear to auscultation bilaterally. Healing sternotomy  GI: Soft, nontender, non-distended  MS: No edema; No deformity. Neuro:  Nonfocal  Psych: Normal affect   Labs    Chemistry Recent Labs  Lab 10/09/18 0330 10/10/18 0312 10/13/18 0355  NA 128* 128* 127*  K 4.7 4.9 4.6  CL 91* 88* 86*  CO2 26 29 30   GLUCOSE 219* 244* 149*  BUN 40* 38* 41*  CREATININE 1.98* 2.06* 2.08*  CALCIUM 8.3* 8.8* 8.7*  GFRNONAA 32* 31* 30*  GFRAA 37* 36* 35*  ANIONGAP 11 11 11      Hematology Recent Labs  Lab 10/08/18 0249 10/10/18 0312  WBC 10.5 12.9*  RBC 2.64* 3.10*  HGB 7.7* 9.0*  HCT 24.3* 27.4*  MCV 92.0 88.4  MCH 29.2 29.0  MCHC 31.7 32.8  RDW 14.6 14.1  PLT 335 394    Cardiac EnzymesNo results for input(s): TROPONINI in the last 168 hours. No results for input(s): TROPIPOC in the last 168 hours.   BNPNo results for input(s): BNP, PROBNP in the last 168 hours.   DDimer No results for input(s): DDIMER in the last 168 hours.   Radiology    No results found.  Cardiac Studies   N/a   Patient Profile     73 y.o. male  with history of DM, HTN, tobacco  abuse admitted with inferior STEMI. Cardiac cath 09/29/18 with severe three vessel CAD and thrombus in the RCA with 90% occlusion of the vessel. CABG with LIMA to LAD / SVG to D1 / SVG Y to PDA / PL. Post op AF and anxiety depression.  Assessment & Plan    1. Status post multivessel CABG: 3v CAD noted on cath.  No recurrent angina noted. On ASA, statin, BB.   2. Postoperative atrial fibrillation: remains in SR. Amiodarone taper on 200mg  BID. Full dose ASA  3. Anxiety/depression: seems to be alittle better this morning. Actually getting into the chair with CR.   4. CKD: baseline CR seems to be more so around 1.0-1.5 prior to admission. Up to 2 this admission. Does not appear volume overloaded on exam. Will hold lasix, and HCTZ and follow up on CR in the morning.   For questions or updates, please contact [place] Please consult [email or web address] for contact info under   Signed, Reino Bellis, NP  10/14/2018, 10:23 AM

## 2018-10-14 NOTE — Progress Notes (Signed)
CARDIAC REHAB PHASE I   PRE:  Rate/Rhythm: 71 SR    BP: sitting 126/56    SaO2: 98 RA  MODE:  Ambulation: 10 ft in hall   POST:  Rate/Rhythm: 80 SR    BP: sitting 141/53     SaO2: 99 RA  Pt not feeling as good today as yesterday. Sts he had a bad night and can't get over it. Required extreme encouragement today. Needed > 20 min rest at each rest (EOB, after walking to door). C/o nausea, dizziness, SOB, anxiety. Repeatedly requesting more time to get ready.  Requested to be rolled back to room and to bed after walking 10 ft in hall. He used RW, gait belt, assist x1 with another assist for recliner follow. Fairly steady with tennis shoes on. We declined him getting to bed, kept in recliner. Continued to encourage pursed lip breathing to help with anxiety. VSS, will f/u. [phone]  [place], ACSM 10/14/2018 11:07 AM

## 2018-10-14 NOTE — Progress Notes (Signed)
Morning insulin not given. Patient with very poor intake and needs much motivation even to eat. Patient states that he wants his energy back, he is encouraged to eat.  Trying to eat breakfast at this time.

## 2018-10-14 NOTE — Progress Notes (Addendum)
@   approx. 2000 pt's wife left message for this RN to call and update her. Due to pt care, this RN unable to return call until approx 2200. Call attempted x2 with no response. Will attempt to recontact in AM. Pt updated and amenable to plan.  Update: @approx . 2210 pt wife returned call. Wife updated and all questions answered.

## 2018-10-14 NOTE — Progress Notes (Signed)
Pt sts he had a bad night and doesn't feel well this am. Asked to walk later. We will return around 1030-1100. Set up pts breakfast. He sts he wants to get energy and get better. Yves Dill CES, ACSM 8:16 AM 10/14/2018

## 2018-10-14 NOTE — Progress Notes (Signed)
PT Cancellation Note  Patient Details Name: Martin [name] [facility] Sr. MRN: [id number] DOB: Sep 15, 1945   Cancelled Treatment:    Reason Eval/Treat Not Completed: Other (comment).  Pt gave a flat refusal, stating he was nauseated.  He is up in chair and asked him to work on ex in chair, again refused.  Will try again at another time.   Ramond Dial 10/14/2018, 1:56 PM   Mee Hives, PT MS Acute Rehab Dept. Number: [place]

## 2018-10-14 NOTE — Progress Notes (Addendum)
[address]       [place] [address]             [phone]      13 Days Post-Op Procedure(s) (LRB): CORONARY ARTERY BYPASS GRAFTING (CABG) TIMES  FOUR USING LEFT MAMMARY ARTERY AND RIGHT GREATER SAPHEANOUS VEIN HARVESTED ENDOSCOPICALLY (N/A) TRANSESOPHAGEAL ECHOCARDIOGRAM (TEE) (N/A) Subjective: conts to struggle with motivation, anxiety a little better today  Objective: Vital signs in last 24 hours: Temp:  [97.9 F (36.6 C)] 97.9 F (36.6 C) (06/17 0300) Pulse Rate:  [69-73] 69 (06/17 0500) Cardiac Rhythm: Normal sinus rhythm (06/17 0240) Resp:  [18-21] 18 (06/17 0500) BP: (121-126)/(52-94) 123/94 (06/17 0300) SpO2:  [92 %] 92 % (06/17 0500) Weight:  [126.2 kg] 126.2 kg (06/17 0500)  Hemodynamic parameters for last 24 hours:    Intake/Output from previous day: 06/16 0701 - 06/17 0700 In: 237 [P.O.:237] Out: 700 [Urine:700] Intake/Output this shift: No intake/output data recorded.  General appearance: alert, cooperative and no distress Heart: regular rate and rhythm Lungs: mildly dim in bases Abdomen: benign, obese Extremities: + edema Wound: incis healing well  Lab Results: No results for input(s): WBC, HGB, HCT, PLT in the last 72 hours. BMET:  Recent Labs    10/13/18 0355  NA 127*  K 4.6  CL 86*  CO2 30  GLUCOSE 149*  BUN 41*  CREATININE 2.08*  CALCIUM 8.7*    PT/INR: No results for input(s): LABPROT, INR in the last 72 hours. ABG    Component Value Date/Time   PHART 7.343 (L) 10/01/2018 1946   HCO3 21.5 10/01/2018 1946   TCO2 22 10/01/2018 2009   ACIDBASEDEF 4.0 (H) 10/01/2018 1946   O2SAT 95.0 10/01/2018 1946   CBG (last 3)  Recent Labs    10/13/18 1621 10/13/18 2118 10/14/18 0628  GLUCAP 151* 129* 76    Meds Scheduled Meds: . ALPRAZolam  0.25 mg Oral BID  . amiodarone  200 mg Oral BID  . amLODipine  5 mg Oral Daily  . aspirin EC  325 mg Oral Daily   Or  . aspirin  324 mg Per Tube Daily  . bisacodyl   10 mg Oral Daily   Or  . bisacodyl  10 mg Rectal Daily  . brimonidine  1 drop Left Eye BID  . docusate sodium  200 mg Oral Daily  . dorzolamide  1 drop Left Eye TID  . enoxaparin (LOVENOX) injection  40 mg Subcutaneous QHS  . feeding supplement (GLUCERNA SHAKE)  237 mL Oral TID BM  . furosemide  40 mg Oral Daily  . guaiFENesin  1,200 mg Oral BID  . hydrochlorothiazide  25 mg Oral Daily  . insulin aspart  0-20 Units Subcutaneous TID WC  . insulin aspart  25 Units Subcutaneous TID WC  . insulin detemir  60 Units Subcutaneous BID  . mouth rinse  15 mL Mouth Rinse BID  . metoprolol tartrate  12.5 mg Oral BID   Or  . metoprolol tartrate  12.5 mg Per Tube BID  . mirtazapine  15 mg Oral QHS  . multivitamin with minerals  1 tablet Oral Daily  . pantoprazole  40 mg Oral Daily  . potassium chloride  10 mEq Oral BID  . rosuvastatin  40 mg Oral q1800  . sodium chloride flush  3 mL Intravenous Q12H  . tamsulosin  0.4 mg Oral Daily   Continuous Infusions: . sodium chloride     PRN Meds:.sodium chloride,  alum & mag hydroxide-simeth, lactulose, levalbuterol, lidocaine (PF), ondansetron (ZOFRAN) IV, oxyCODONE, polyethylene glycol, sodium chloride flush, traMADol  Xrays No results found.  Assessment/Plan: S/P Procedure(s) (LRB): CORONARY ARTERY BYPASS GRAFTING (CABG) TIMES  FOUR USING LEFT MAMMARY ARTERY AND RIGHT GREATER SAPHEANOUS VEIN HARVESTED ENDOSCOPICALLY (N/A) TRANSESOPHAGEAL ECHOCARDIOGRAM (TEE) (N/A)  1 conts with slow rehab, encourage him to do as much as he can with rehab 2 anxiety a little better with xanax 3 stable hemodynamics , in sinus rhythm, sats good on RA, no fevers 4 BS control fair 5 no new labs, recheck chem in am 6 cont gentle diuresis 7 SNF placement  LOS: 15 days    John Giovanni [facility] 10/14/2018  Pager [phone]  Patient seen and examined, agree with above I spoke to Mrs. Wassmer and updated her on his condition  Revonda Standard. Roxan Hockey, MD [facility] [phone]

## 2018-10-14 NOTE — Progress Notes (Signed)
Called wife [name]) and updated on the plan of care. She expressed her desire to come and see him as he stated this morning that he didn't know if he still wanted to live.  She also express the desire to set up a video call but patient has stated that he doesn't want that at this time. Explained to the wife the plan for the day was to continual to provide motivation for the patient and to encourage activity. Will call later on today and inform her of the progress.  She was very appreciative for the call.

## 2018-10-15 LAB — BASIC METABOLIC PANEL
Anion gap: 12 (ref 5–15)
BUN: 40 mg/dL — ABNORMAL HIGH (ref 8–23)
CO2: 27 mmol/L (ref 22–32)
Calcium: 8.4 mg/dL — ABNORMAL LOW (ref 8.9–10.3)
Chloride: 87 mmol/L — ABNORMAL LOW (ref 98–111)
Creatinine, Ser: 2.05 mg/dL — ABNORMAL HIGH (ref 0.61–1.24)
GFR calc Af Amer: 36 mL/min — ABNORMAL LOW (ref >60)
GFR calc non Af Amer: 31 mL/min — ABNORMAL LOW (ref >60)
Glucose, Bld: 152 mg/dL — ABNORMAL HIGH (ref 70–99)
Potassium: 4.1 mmol/L (ref 3.5–5.1)
Sodium: 126 mmol/L — ABNORMAL LOW (ref 135–145)

## 2018-10-15 LAB — GLUCOSE, CAPILLARY
Glucose-Capillary: 162 mg/dL — ABNORMAL HIGH (ref 70–99)
Glucose-Capillary: 100 mg/dL — ABNORMAL HIGH (ref 70–99)
Glucose-Capillary: 122 mg/dL — ABNORMAL HIGH (ref 70–99)
Glucose-Capillary: 238 mg/dL — ABNORMAL HIGH (ref 70–99)

## 2018-10-15 LAB — SARS CORONAVIRUS 2: SARS Coronavirus 2: NOT DETECTED

## 2018-10-15 MED ORDER — MEGESTROL ACETATE 40 MG PO TABS
40.0000 mg | ORAL_TABLET | Freq: Every day | ORAL | Status: DC
  Administered 2018-10-15 – 2018-10-19 (×5): 40 mg via ORAL
  Filled 2018-10-15 (×5): qty 1

## 2018-10-15 MED ORDER — ROSUVASTATIN CALCIUM 5 MG PO TABS
10.0000 mg | ORAL_TABLET | Freq: Every day | ORAL | Status: DC
  Administered 2018-10-15 – 2018-10-19 (×5): 10 mg via ORAL
  Filled 2018-10-15 (×5): qty 2

## 2018-10-15 MED ORDER — AMIODARONE HCL 200 MG PO TABS
200.0000 mg | ORAL_TABLET | Freq: Every day | ORAL | Status: DC
  Administered 2018-10-16 – 2018-10-19 (×4): 200 mg via ORAL
  Filled 2018-10-15 (×4): qty 1

## 2018-10-15 MED ORDER — GUAIFENESIN ER 600 MG PO TB12
1200.0000 mg | ORAL_TABLET | Freq: Two times a day (BID) | ORAL | Status: DC | PRN
  Administered 2018-10-15: 1200 mg via ORAL
  Filled 2018-10-15: qty 2

## 2018-10-15 NOTE — Progress Notes (Signed)
Spoke to wife [name]). She called wondering why patient didn't want to talk to her until after 10. Explained that patient was up in the chair early this morning and hopes to have PT visit completed by 10 but he is doing ok. Spirits seem to be a bit brighter this morning. Suggested to the wife that a picture collage of family may assist in motivating patient to participate more with therapy and plan of care. She stated that she has obtained a list of facilities from [place] and is concerned about his safety if goes to a facility but acknowledges that he is not appropriate for CIR.

## 2018-10-15 NOTE — Progress Notes (Signed)
Physical Therapy Treatment Patient Details Name: Martin [name] [facility] MRN: [id number] DOB: August 17, 1945 Today's Date: 10/15/2018    History of Present Illness Mr. 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  On 6/4, pt s/p CABG x3    PT Comments    Pt finally was able to be motivated enough to participate.  Emphasis on transition safety and progression of gait stability and stamina with the rollator.   Follow Up Recommendations  CIR;Other (comment)(but SNF if he doesn't start progressing further)     Equipment Recommendations       Recommendations for Other Services       Precautions / Restrictions Precautions Precautions: Sternal    Mobility  Bed Mobility Overal bed mobility: Needs Assistance Bed Mobility: Sidelying to Sit   Sidelying to sit: Min assist       General bed mobility comments: up via L elbow with minimal amount of push.  Transfers Overall transfer level: Needs assistance Equipment used: 4-wheeled walker Transfers: Sit to/from Stand Sit to Stand: Min assist         General transfer comment: cueing for sternal precautions, hand placement and technique; mod assist to power up from chair and EOB with max cueing to maintain forward positioning (multiple attempts required)  Ambulation/Gait Ambulation/Gait assistance: Min assist Gait Distance (Feet): 48 Feet(then additional 80 feet) Assistive device: 4-wheeled walker Gait Pattern/deviations: Step-through pattern     General Gait Details: slower steps, mildly hunched posture, generally steady, but pt reports feeling anxious and a little dizzy.   Stairs             Wheelchair Mobility    Modified Rankin (Stroke Patients Only)       Balance Overall balance assessment: Needs assistance   Sitting balance-Leahy Scale: Fair     Standing balance support: Bilateral upper  extremity supported;During functional activity Standing balance-Leahy Scale: Poor Standing balance comment: relaint on support                            Cognition Arousal/Alertness: Awake/alert Behavior During Therapy: Flat affect Overall Cognitive Status: Impaired/Different from baseline                   Orientation Level: Situation Current Attention Level: Sustained Memory: Decreased recall of precautions;Decreased short-term memory Following Commands: Follows one step commands inconsistently Safety/Judgement: Decreased awareness of safety;Decreased awareness of deficits Awareness: Emergent Problem Solving: Slow processing;Decreased initiation        Exercises      General Comments General comments (skin integrity, edema, etc.): VSS, sats on RA in mid 90's, EHR upper 80's.  Worked  on sitting in the Rollator with practice locking brakes, standing, turning and sitting without use of uE's      Pertinent Vitals/Pain Pain Assessment: Faces Faces Pain Scale: No hurt Pain Intervention(s): Monitored during session    Home Living                      Prior Function            PT Goals (current goals can now be found in the care plan section) Acute Rehab PT Goals Patient Stated Goal: Get my Independence back PT Goal Formulation: With patient Time For Goal Achievement: 10/20/18 Potential to Achieve Goals: Good Progress towards PT goals:  Progressing toward goals    Frequency    Min 3X/week      PT Plan Current plan remains appropriate    Co-evaluation              AM-PAC PT "6 Clicks" Mobility   Outcome Measure  Help needed turning from your back to your side while in a flat bed without using bedrails?: None Help needed moving from lying on your back to sitting on the side of a flat bed without using bedrails?: None Help needed moving to and from a bed to a chair (including a wheelchair)?: A Little Help needed standing up from  a chair using your arms (e.g., wheelchair or bedside chair)?: A Little Help needed to walk in hospital room?: A Little Help needed climbing 3-5 steps with a railing? : A Little 6 Click Score: 20    End of Session   Activity Tolerance: Patient limited by fatigue     PT Visit Diagnosis: Other abnormalities of gait and mobility (R26.89);Pain;Difficulty in walking, not elsewhere classified (R26.2)     Time: 3329-5188 PT Time Calculation (min) (ACUTE ONLY): 23 min  Charges:  $Gait Training: 8-22 mins $Therapeutic Activity: 8-22 mins                     10/15/2018  Donnella Sham, PT [place] [phone]  (pager) [phone]  (office)   Tessie Fass [place] 10/15/2018, 4:21 PM

## 2018-10-15 NOTE — Plan of Care (Signed)
  Problem: Education: Goal: Knowledge of General Education information will improve Description: Including pain rating scale, medication(s)/side effects and non-pharmacologic comfort measures Outcome: Progressing   Problem: Health Behavior/Discharge Planning: Goal: Ability to manage health-related needs will improve Outcome: Progressing   Problem: Clinical Measurements: Goal: Will remain free from infection Outcome: Progressing   Problem: Elimination: Goal: Will not experience complications related to bowel motility Outcome: Progressing   Problem: Safety: Goal: Ability to remain free from injury will improve Outcome: Progressing

## 2018-10-15 NOTE — Plan of Care (Signed)
  Problem: Clinical Measurements: Goal: Will remain free from infection Outcome: Progressing Goal: Diagnostic test results will improve Outcome: Progressing Goal: Cardiovascular complication will be avoided Outcome: Progressing   Problem: Health Behavior/Discharge Planning: Goal: Ability to manage health-related needs will improve Outcome: Not Progressing   Problem: Clinical Measurements: Goal: Ability to maintain clinical measurements within normal limits will improve Outcome: Not Progressing   Problem: Coping: Goal: Level of anxiety will decrease Outcome: Not Progressing

## 2018-10-15 NOTE — Progress Notes (Addendum)
The patient has been seen in conjunction with Reino Bellis, NP. All aspects of care have been considered and discussed. The patient has been personally interviewed, examined, and all clinical data has been reviewed.   Creatinine still elevated.  No recurrence of A. Fib.  Improving clinically.   Progress Note  Patient Name: Martin [name]. Date of Encounter: 10/15/2018  Primary Cardiologist: No primary care provider on file.   Subjective   Says he has had a better morning today.   Inpatient Medications    Scheduled Meds: . [START ON 10/16/2018] amiodarone  200 mg Oral Daily  . amLODipine  5 mg Oral Daily  . aspirin EC  325 mg Oral Daily   Or  . aspirin  324 mg Per Tube Daily  . bisacodyl  10 mg Oral Daily   Or  . bisacodyl  10 mg Rectal Daily  . brimonidine  1 drop Left Eye BID  . docusate sodium  200 mg Oral Daily  . dorzolamide  1 drop Left Eye TID  . enoxaparin (LOVENOX) injection  40 mg Subcutaneous QHS  . feeding supplement (GLUCERNA SHAKE)  237 mL Oral TID BM  . insulin aspart  0-20 Units Subcutaneous TID WC  . insulin aspart  25 Units Subcutaneous TID WC  . insulin detemir  60 Units Subcutaneous BID  . mouth rinse  15 mL Mouth Rinse BID  . megestrol  40 mg Oral Daily  . metoprolol tartrate  12.5 mg Oral BID   Or  . metoprolol tartrate  12.5 mg Per Tube BID  . mirtazapine  15 mg Oral QHS  . multivitamin with minerals  1 tablet Oral Daily  . pantoprazole  40 mg Oral Daily  . potassium chloride  10 mEq Oral BID  . rosuvastatin  10 mg Oral q1800  . sodium chloride flush  3 mL Intravenous Q12H  . tamsulosin  0.4 mg Oral Daily   Continuous Infusions: . sodium chloride Stopped (10/14/18 1935)   PRN Meds: sodium chloride, ALPRAZolam, alum & mag hydroxide-simeth, guaiFENesin, lactulose, levalbuterol, lidocaine (PF), ondansetron (ZOFRAN) IV, oxyCODONE, polyethylene glycol, sodium chloride flush, traMADol   Vital Signs    Vitals:   10/14/18 2140  10/15/18 0500 10/15/18 0515 10/15/18 0827  BP: (!) 122/55  138/64 131/66  Pulse: 78  72 73  Resp: (!) 27  20   Temp:   97.6 F (36.4 C)   TempSrc:   Oral   SpO2: 92%  94%   Weight:  125.2 kg    Height:        Intake/Output Summary (Last 24 hours) at 10/15/2018 0957 Last data filed at 10/15/2018 0300 Gross per 24 hour  Intake 580 ml  Output 1525 ml  Net -945 ml   Last 3 Weights 10/15/2018 10/14/2018 10/13/2018  Weight (lbs) 276 lb 0.3 oz 278 lb 3.5 oz 282 lb 13.6 oz  Weight (kg) 125.2 kg 126.2 kg 128.3 kg      Telemetry    [name] - Personally Reviewed  ECG    N/a  - Personally Reviewed  Physical Exam   GEN: No acute distress.   Neck: No JVD Cardiac: RRR, no murmurs, rubs, or gallops.  Respiratory: Clear to auscultation bilaterally. Healing sternotomy GI: Soft, nontender, non-distended  MS: No edema; No deformity. Neuro:  Nonfocal  Psych: Normal affect   Labs    Chemistry Recent Labs  Lab 10/10/18 0312 10/13/18 0355 10/15/18 0322  NA 128* 127* 126*  K 4.9  4.6 4.1  CL 88* 86* 87*  CO2 29 30 27   GLUCOSE 244* 149* 152*  BUN 38* 41* 40*  CREATININE 2.06* 2.08* 2.05*  CALCIUM 8.8* 8.7* 8.4*  GFRNONAA 31* 30* 31*  GFRAA 36* 35* 36*  ANIONGAP 11 11 12      Hematology Recent Labs  Lab 10/10/18 0312  WBC 12.9*  RBC 3.10*  HGB 9.0*  HCT 27.4*  MCV 88.4  MCH 29.0  MCHC 32.8  RDW 14.1  PLT 394    Cardiac EnzymesNo results for input(s): TROPONINI in the last 168 hours. No results for input(s): TROPIPOC in the last 168 hours.   BNPNo results for input(s): BNP, PROBNP in the last 168 hours.   DDimer No results for input(s): DDIMER in the last 168 hours.   Radiology    No results found.  Cardiac Studies   N/a   Patient Profile     73 y.o. male with history of DM, HTN, tobacco abuse admitted with inferior STEMI. Cardiac cath 09/29/18 with severe three vessel CAD and thrombus in the RCA with 90% occlusion of the vessel. CABG with LIMA to LAD / SVG to D1  / SVG Y to PDA / PL. Post op AF and anxiety depression.  Assessment & Plan    1. Status post multivessel CABG:3v CAD noted on cath. No recurrent angina noted. On ASA, statin, BB.   2. Postoperative atrial fibrillation: remains in [name]. Amiodarone taper on 200mg  BID. Full dose ASA  3. Anxiety/depression: seems to be alittle better this morning. Actually getting into the chair with CR.   4. CKD: baseline CR seems to be more so around 1.0-1.5 prior to admission. Up to 2 this admission. Does not appear volume overloaded on exam. Diuretic on held, follow up Cr in the morning.    For questions or updates, please contact [place] Please consult [email or web address] for contact info under        Signed, Reino Bellis, NP  10/15/2018, 9:57 AM

## 2018-10-15 NOTE — Progress Notes (Addendum)
[address]       [place] [address]             [phone]      14 Days Post-Op Procedure(s) (LRB): CORONARY ARTERY BYPASS GRAFTING (CABG) TIMES  FOUR USING LEFT MAMMARY ARTERY AND RIGHT GREATER SAPHEANOUS VEIN HARVESTED ENDOSCOPICALLY (N/A) TRANSESOPHAGEAL ECHOCARDIOGRAM (TEE) (N/A) Subjective: Feels a little better this am , showing some increased motivation  Objective: Vital signs in last 24 hours: Temp:  [97.6 F (36.4 C)-97.9 F (36.6 C)] 97.6 F (36.4 C) (06/18 0515) Pulse Rate:  [71-78] 73 (06/18 0827) Cardiac Rhythm: Normal sinus rhythm (06/18 0700) Resp:  [18-27] 20 (06/18 0515) BP: (122-141)/(53-89) 131/66 (06/18 0827) SpO2:  [91 %-100 %] 94 % (06/18 0515) Weight:  [125.2 kg] 125.2 kg (06/18 0500)  Hemodynamic parameters for last 24 hours:    Intake/Output from previous day: 06/17 0701 - 06/18 0700 In: 580 [P.O.:580] Out: 1525 [Urine:1525] Intake/Output this shift: No intake/output data recorded.  General appearance: alert, cooperative and no distress Heart: regular rate and rhythm Lungs: mildly dim in lower fields Abdomen: obese, benign Extremities: edema is improved Wound: incis healing well    Lab Results: No results for input(s): WBC, HGB, HCT, PLT in the last 72 hours. BMET:  Recent Labs    10/13/18 0355 10/15/18 0322  NA 127* 126*  K 4.6 4.1  CL 86* 87*  CO2 30 27  GLUCOSE 149* 152*  BUN 41* 40*  CREATININE 2.08* 2.05*  CALCIUM 8.7* 8.4*    PT/INR: No results for input(s): LABPROT, INR in the last 72 hours. ABG    Component Value Date/Time   PHART 7.343 (L) 10/01/2018 1946   HCO3 21.5 10/01/2018 1946   TCO2 22 10/01/2018 2009   ACIDBASEDEF 4.0 (H) 10/01/2018 1946   O2SAT 95.0 10/01/2018 1946   CBG (last 3)  Recent Labs    10/14/18 1658 10/14/18 2139 10/15/18 0608  GLUCAP 139* 216* 162*    Meds Scheduled Meds: . amiodarone  200 mg Oral BID  . amLODipine  5 mg Oral Daily  . aspirin EC  325 mg  Oral Daily   Or  . aspirin  324 mg Per Tube Daily  . bisacodyl  10 mg Oral Daily   Or  . bisacodyl  10 mg Rectal Daily  . brimonidine  1 drop Left Eye BID  . docusate sodium  200 mg Oral Daily  . dorzolamide  1 drop Left Eye TID  . enoxaparin (LOVENOX) injection  40 mg Subcutaneous QHS  . feeding supplement (GLUCERNA SHAKE)  237 mL Oral TID BM  . guaiFENesin  1,200 mg Oral BID  . insulin aspart  0-20 Units Subcutaneous TID WC  . insulin aspart  25 Units Subcutaneous TID WC  . insulin detemir  60 Units Subcutaneous BID  . mouth rinse  15 mL Mouth Rinse BID  . metoprolol tartrate  12.5 mg Oral BID   Or  . metoprolol tartrate  12.5 mg Per Tube BID  . mirtazapine  15 mg Oral QHS  . multivitamin with minerals  1 tablet Oral Daily  . pantoprazole  40 mg Oral Daily  . potassium chloride  10 mEq Oral BID  . rosuvastatin  40 mg Oral q1800  . sodium chloride flush  3 mL Intravenous Q12H  . tamsulosin  0.4 mg Oral Daily   Continuous Infusions: . sodium chloride Stopped (10/14/18 1935)   PRN Meds:.sodium chloride, ALPRAZolam, alum & mag  hydroxide-simeth, lactulose, levalbuterol, lidocaine (PF), ondansetron (ZOFRAN) IV, oxyCODONE, polyethylene glycol, sodium chloride flush, traMADol  Xrays No results found.  Assessment/Plan: S/P Procedure(s) (LRB): CORONARY ARTERY BYPASS GRAFTING (CABG) TIMES  FOUR USING LEFT MAMMARY ARTERY AND RIGHT GREATER SAPHEANOUS VEIN HARVESTED ENDOSCOPICALLY (N/A) TRANSESOPHAGEAL ECHOCARDIOGRAM (TEE) (N/A)  1 conts slow recovery, more motivated today . Depression and anxiety improving slowly 2 hemodyn stable in sinus rhythm, no fevers 3 sats ok on RA- cont to push pulm toilet 4 renal fxn stable, off diuretics currently, UOP is good, edema conts to improve 6 BS control improved but still significant variations- hopefully will stabilize as appetite does  LOS: 16 days    John Giovanni PA-C 10/15/2018  Pager [phone]  Patient seen and examined, agree with  above  Remo Lipps C. Roxan Hockey, MD [facility] [phone]

## 2018-10-15 NOTE — Progress Notes (Signed)
CARDIAC REHAB PHASE I   PRE:  Rate/Rhythm: 70 SR    BP: sitting 132/69    SaO2: 96 RA  MODE:  Ambulation: 34 ft   POST:  Rate/Rhythm: 80 SR    BP: sitting 123/68     SaO2: 92 RA  Pt up in recliner since early morning. Willing to walk. Stood with assist x2 with gait belt and ambulated to outside of door with RW. Fairly steady, slightly better anxiety today.  Rested in rollator seat a few steps outside door for 6-7 min then ambulated 28 more feet in hall. Sat and rested. We attempted to get him to stand and rest but he feels dizzy and anxious and weak and ultimately has to sit down. He relates it to "going in to a meeting" and feeling anxious/adrenaline. After sitting the second time pt requested to roll back to room. We could not convince him to walk another stent, even though we had made that the goal today. To bed, VSS. BP in left arm not accurate, we use right arm always. Encouraged x2 more walks today, recliner, IS/flutter valve as well as intermittent rest.  [phone]  [place], ACSM 10/15/2018 10:36 AM

## 2018-10-15 NOTE — TOC Progression Note (Signed)
Transition of Care (TOC) - Progression Note    Patient Details  Name: Martin [name] [facility] MRN: [id number] Date of Birth: 11/08/1945  Transition of Care [facility]) CM/SW [place], [place] Phone Number: 10/15/2018, 3:09 PM  Clinical Narrative:     Patient's wife has accepted bed offer with [facility]. CSW started insurance authorization  with [employer] today. CSW informed RN patient will need Covid test before discharging to SNF.  CSW waiting on insurance authorization  Thurmond Butts, MSW, [place] Social Worker [phone]   Expected Discharge Plan: [place] Barriers to Discharge: Continued Medical Work up, Ship broker  Expected Discharge Plan and Services Expected Discharge Plan: [place] In-house Referral: Clinical Social Work Discharge Planning Services: CM Consult Post Acute Care Choice: NA(undecided at this time) Living arrangements for the past 2 months: Single Family Home                                       Social Determinants of Health (SDOH) Interventions    Readmission Risk Interventions No flowsheet data found.

## 2018-10-16 LAB — BASIC METABOLIC PANEL
Anion gap: 11 (ref 5–15)
BUN: 39 mg/dL — ABNORMAL HIGH (ref 8–23)
CO2: 27 mmol/L (ref 22–32)
Calcium: 8.4 mg/dL — ABNORMAL LOW (ref 8.9–10.3)
Chloride: 87 mmol/L — ABNORMAL LOW (ref 98–111)
Creatinine, Ser: 2 mg/dL — ABNORMAL HIGH (ref 0.61–1.24)
GFR calc Af Amer: 37 mL/min — ABNORMAL LOW (ref >60)
GFR calc non Af Amer: 32 mL/min — ABNORMAL LOW (ref >60)
Glucose, Bld: 173 mg/dL — ABNORMAL HIGH (ref 70–99)
Potassium: 3.8 mmol/L (ref 3.5–5.1)
Sodium: 125 mmol/L — ABNORMAL LOW (ref 135–145)

## 2018-10-16 LAB — URINALYSIS, ROUTINE W REFLEX MICROSCOPIC
Bilirubin Urine: NEGATIVE
Glucose, UA: NEGATIVE mg/dL
Hgb urine dipstick: NEGATIVE
Ketones, ur: NEGATIVE mg/dL
Leukocytes,Ua: NEGATIVE
Nitrite: NEGATIVE
Protein, ur: 30 mg/dL — AB
Specific Gravity, Urine: 1.013 (ref 1.005–1.030)
pH: 6 (ref 5.0–8.0)

## 2018-10-16 LAB — GLUCOSE, CAPILLARY
Glucose-Capillary: 151 mg/dL — ABNORMAL HIGH (ref 70–99)
Glucose-Capillary: 197 mg/dL — ABNORMAL HIGH (ref 70–99)
Glucose-Capillary: 246 mg/dL — ABNORMAL HIGH (ref 70–99)
Glucose-Capillary: 289 mg/dL — ABNORMAL HIGH (ref 70–99)
Glucose-Capillary: 255 mg/dL — ABNORMAL HIGH (ref 70–99)

## 2018-10-16 LAB — CBC
HCT: 26.7 % — ABNORMAL LOW (ref 39.0–52.0)
Hemoglobin: 8.9 g/dL — ABNORMAL LOW (ref 13.0–17.0)
MCH: 28.9 pg (ref 26.0–34.0)
MCHC: 33.3 g/dL (ref 30.0–36.0)
MCV: 86.7 fL (ref 80.0–100.0)
Platelets: 343 10*3/uL (ref 150–400)
RBC: 3.08 MIL/uL — ABNORMAL LOW (ref 4.22–5.81)
RDW: 14.7 % (ref 11.5–15.5)
WBC: 17.7 10*3/uL — ABNORMAL HIGH (ref 4.0–10.5)
nRBC: 0 % (ref 0.0–0.2)

## 2018-10-16 NOTE — Consult Note (Addendum)
   [facility] CM Inpatient Consult   10/16/2018  [place] Sr. [birth year] [id number]    Patient screened for potential [facility] care management services needed as benefit from his [employer] plan, with 18% medium risk score forunplanned readmission and  hospitalization. Patient was outreached by [facility] Telephonic care management coordinator in the past but unable to establish contact.  Chart review andMD notes dated 6/17/20show as follows: Patient is a 73 y.o. male with history of DM, HTN, tobacco abuse admitted with inferior STEMI. Cardiac cath 09/29/18 with severe three vessel CAD and thrombus in the RCA with 90% occlusion of the vessel. Had CABG with LIMA to LAD / SVG to D1 / SVG Y to PDA / PL. Post-op AF and anxiety depression. (Status post multivessel CABG for three-vessel native CAD)  Therapy notes reviewed andcurrently recommending for SNF (skilled nursing facility) at discharge. Review of transition of care SW note indicates that patient was denied admission to inpatient rehab- CIR through [place] insurance.  Patient's primary care provider isDr. [name] with [facility], listed as providing transition of care.  Note: Patient will be followed by an external care management group ([facility]) post discharge to follow-up needs and for continued case management services.   For additional questions or referrals, please contact:  Edwena Felty A. Viet Kemmerer, BSN, RN-BC [facility] Liaison Cell: [phone]

## 2018-10-16 NOTE — Progress Notes (Signed)
Physical Therapy Treatment Patient Details Name: Martin Tober [facility] Sr. 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  On 6/4, pt s/p CABG x3    PT Comments    Pt needed less encouragement to participate, but pushed himself much less today.  Emphasis was on sternal precautions, transition safety and progressing gait.    Follow Up Recommendations  SNF;Supervision/Assistance - 24 hour     Equipment Recommendations  Other (comment)(TBA)    Recommendations for Other Services       Precautions / Restrictions Precautions Precautions: Sternal    Mobility  Bed Mobility               General bed mobility comments: OOB on arrival and returned to the chair.  Transfers Overall transfer level: Needs assistance Equipment used: 4-wheeled walker Transfers: Sit to/from Stand Sit to Stand: Min assist         General transfer comment: cueing for sternal precautions, hand placement and technique; mod assist to power up from chair and EOB with max cueing to maintain forward positioning (multiple attempts required)  Ambulation/Gait Ambulation/Gait assistance: Min guard;Min assist Gait Distance (Feet): 25 Feet(then 40 feet and an additional 45 ft.  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    Modified Rankin (Stroke Patients Only)       Balance     Sitting balance-Leahy Scale: Fair     Standing balance support: Bilateral upper extremity  supported;During functional activity;Single extremity supported Standing balance-Leahy Scale: Poor(to fair) Standing balance comment: relaint on support                            Cognition Arousal/Alertness: Awake/alert Behavior During Therapy: Flat affect Overall Cognitive Status: (NT formally)                                        Exercises      General Comments General comments (skin integrity, edema, etc.): VSS overall      Pertinent Vitals/Pain Pain Assessment: No/denies pain Pain Intervention(s): Monitored during session    Home Living                      Prior Function            PT Goals (current goals can now be found in the care plan section) Acute Rehab PT Goals Patient Stated Goal: Get my Independence back PT Goal Formulation: With patient Time For Goal Achievement: 10/20/18 Potential to Achieve Goals: Fair Progress towards PT goals: Progressing toward goals    Frequency    Min 3X/week      PT Plan Current plan remains appropriate    Co-evaluation              AM-PAC PT "6 Clicks" Mobility   Outcome Measure  Help needed turning from your back to your side while in a flat bed without using bedrails?: None Help needed moving from lying on your back to sitting on the side of a flat bed without using bedrails?: None Help needed moving to and from a bed to a chair (including a wheelchair)?: A Little Help needed standing up from a chair using your arms (e.g., wheelchair or bedside chair)?: A Little Help needed to walk in hospital room?: A Little Help needed climbing 3-5 steps with a railing? : A Little 6 Click Score: 20    End of Session   Activity Tolerance: Patient limited by fatigue Patient left: in bed;with call bell/phone within reach;with bed alarm set Nurse Communication: Mobility status PT Visit Diagnosis: Other abnormalities of gait and mobility (R26.89);Difficulty in walking, not elsewhere  classified (R26.2)     Time: 4742-5956 PT Time Calculation (min) (ACUTE ONLY): 23 min  Charges:  $Gait Training: 8-22 mins $Therapeutic Activity: 8-22 mins                     10/16/2018  Donnella Sham, PT Acute Rehabilitation Services [phone]  (pager) [phone]  (office)   Martin [name] 10/16/2018, 4:28 PM

## 2018-10-16 NOTE — Progress Notes (Addendum)
[place]       [employer] [address]             [phone]      15 Days Post-Op Procedure(s) (LRB): CORONARY ARTERY BYPASS GRAFTING (CABG) TIMES  FOUR USING LEFT MAMMARY ARTERY AND RIGHT GREATER SAPHEANOUS VEIN HARVESTED ENDOSCOPICALLY (N/A) TRANSESOPHAGEAL ECHOCARDIOGRAM (TEE) (N/A) Subjective: Says he's feeling better about rehab progress, motivation is improving  Objective: Vital signs in last 24 hours: Temp:  [97.7 F (36.5 C)-98.4 F (36.9 C)] 98.4 F (36.9 C) (06/19 0610) Pulse Rate:  [67-75] 71 (06/19 0610) Cardiac Rhythm: Normal sinus rhythm (06/19 0700) Resp:  [18-23] 18 (06/19 0610) BP: (117-132)/(55-68) 129/62 (06/19 0610) SpO2:  [91 %-97 %] 97 % (06/19 0610) Weight:  [124.7 kg] 124.7 kg (06/19 0500)  Hemodynamic parameters for last 24 hours:    Intake/Output from previous day: 06/18 0701 - 06/19 0700 In: 960 [P.O.:960] Out: 650 [Urine:650] Intake/Output this shift: No intake/output data recorded.  General appearance: alert, cooperative and no distress Heart: regular rate and rhythm Lungs: dim in lower fields Abdomen: soft, nontender Extremities: min edema Wound: incis healing well  Lab Results: Recent Labs    10/16/18 0426  WBC 17.7*  HGB 8.9*  HCT 26.7*  PLT 343   BMET:  Recent Labs    10/15/18 0322 10/16/18 0426  NA 126* 125*  K 4.1 3.8  CL 87* 87*  CO2 27 27  GLUCOSE 152* 173*  BUN 40* 39*  CREATININE 2.05* 2.00*  CALCIUM 8.4* 8.4*    PT/INR: No results for input(s): LABPROT, INR in the last 72 hours. ABG    Component Value Date/Time   PHART 7.343 (L) 10/01/2018 1946   HCO3 21.5 10/01/2018 1946   TCO2 22 10/01/2018 2009   ACIDBASEDEF 4.0 (H) 10/01/2018 1946   O2SAT 95.0 10/01/2018 1946   CBG (last 3)  Recent Labs    10/15/18 1611 10/15/18 2124 10/16/18 0646  GLUCAP 122* 238* 151*    Meds Scheduled Meds: . amiodarone  200 mg Oral Daily  . amLODipine  5 mg Oral Daily  . aspirin EC  325 mg  Oral Daily   Or  . aspirin  324 mg Per Tube Daily  . bisacodyl  10 mg Oral Daily   Or  . bisacodyl  10 mg Rectal Daily  . brimonidine  1 drop Left Eye BID  . docusate sodium  200 mg Oral Daily  . dorzolamide  1 drop Left Eye TID  . enoxaparin (LOVENOX) injection  40 mg Subcutaneous QHS  . feeding supplement (GLUCERNA SHAKE)  237 mL Oral TID BM  . insulin aspart  0-20 Units Subcutaneous TID WC  . insulin aspart  25 Units Subcutaneous TID WC  . insulin detemir  60 Units Subcutaneous BID  . mouth rinse  15 mL Mouth Rinse BID  . megestrol  40 mg Oral Daily  . metoprolol tartrate  12.5 mg Oral BID   Or  . metoprolol tartrate  12.5 mg Per Tube BID  . mirtazapine  15 mg Oral QHS  . multivitamin with minerals  1 tablet Oral Daily  . pantoprazole  40 mg Oral Daily  . potassium chloride  10 mEq Oral BID  . rosuvastatin  10 mg Oral q1800  . sodium chloride flush  3 mL Intravenous Q12H  . tamsulosin  0.4 mg Oral Daily   Continuous Infusions: . sodium chloride Stopped (10/14/18 1935)   PRN Meds:.sodium chloride, ALPRAZolam,  alum & mag hydroxide-simeth, guaiFENesin, lactulose, levalbuterol, lidocaine (PF), ondansetron (ZOFRAN) IV, oxyCODONE, polyethylene glycol, sodium chloride flush, traMADol  Xrays No results found.  Assessment/Plan: S/P Procedure(s) (LRB): CORONARY ARTERY BYPASS GRAFTING (CABG) TIMES  FOUR USING LEFT MAMMARY ARTERY AND RIGHT GREATER SAPHEANOUS VEIN HARVESTED ENDOSCOPICALLY (N/A) TRANSESOPHAGEAL ECHOCARDIOGRAM (TEE) (N/A) 1 conts to progress slowly 2 primary issue is rehab which is improving 3 hemodyn stable in sinus rhythm, no fevers 4 creat stable, UOP is good of lasix, edema is better 5 increased leukocytosis with WBC 17, will check UA 6 H/H pretty stable 7 BS control variable with somewhat improved trends 8 Covid neg 9 awaits placement  LOS: 17 days    Martin Giovanni PA-C 10/16/2018 Pager [phone]  Patient seen and examined, agree with above Will  shoot to get him to SNF on Monday  Danine Hor C. Roxan Hockey, MD [facility] [phone]

## 2018-10-16 NOTE — Progress Notes (Signed)
CARDIAC REHAB PHASE I   D/c education completed with pt. Pt instructed to shower and monitor incisions daily. Pt given heart healthy and diabetic diet. In-the-tube sheet at bedside. Encouraged continued IS use and walks. Reviewed restrictions and exercise guidelines. Talked to pt at length about rehab. Pt feels like he is "not ready". When asked why pt feels like he is not strong enough, can cannot go to bathroom or get dressed on his own. Pt educated that that is the reason as to why he is going to rehab instead of home. Pt continues to request to stay in the hospital til Monday. CM made aware. Will refer to [facility] [place], pt not able to participate in Virtual Cardiac Rehab at this time. Offered to walk with pt, pt declining stating he needs to talk to his wife. Will try again later as time allows.  [id number] Rufina Falco, RN BSN 10/16/2018 11:13 AM

## 2018-10-16 NOTE — Care Management Important Message (Signed)
Important Message  Patient Details  Name: Martin [name] [facility] Sr. MRN: [id number] Date of Birth: February 21, 1946   Medicare Important Message Given:  Yes     [name] 10/16/2018, 12:09 PM

## 2018-10-16 NOTE — Progress Notes (Signed)
NSR

## 2018-10-16 NOTE — TOC Progression Note (Signed)
Transition of Care (TOC) - Progression Note    Patient Details  Name: Martin [name] [facility] Sr. MRN: [id number] Date of Birth: 06-30-1945  Transition of Care [facility]) CM/SW [place], [place] Phone Number: 10/16/2018, 11:50 AM  Clinical Narrative:     CSW received a phone call from the patient's wife. CSW informed the patient's wife that [facility] has received insurance authorization from [employer]. The patient's wife was upset because she stated that CSW Caren Griffins did not call her back on Thursday and started insurance authorization without her approval. CSW informed the patient's wife that it was noted that she selected [facility] as the facility of choice, she denied that. CSW stated that Caren Griffins was out of the office and she was not sure of what happened. CSW apologized to the patient's wife. She was wanting to inquire about [facility] being a possible bed option. CSW offered to send her husband's referral to [facility]. She stated no that she needed to make a few phone calls. CSW informed the patient's wife that if they waited to long that the bed might not be available at a later time. She stated that she understood. CSW stated that she would be awaiting her phone call.   CSW received a voicemail a little while later from the patient's wife giving permission to send the patient's referral to [facility]. CSW spoke with the admissions social worker, Loree Fee, at [facility] and she stated that they have a bed available and will look at the referral.   CSW will continue to follow.     Expected Discharge Plan: Skilled Nursing Facility Barriers to Discharge: SNF Pending bed offer, SNF Pending discharge summary  Expected Discharge Plan and Services Expected Discharge Plan: [place] In-house Referral: Clinical Social Work Discharge Planning Services: CM Consult Post Acute Care Choice: NA(undecided at this time) Living arrangements for the past 2 months: Single  Family Home                                       Social Determinants of Health (SDOH) Interventions    Readmission Risk Interventions No flowsheet data found.

## 2018-10-17 LAB — BASIC METABOLIC PANEL
Anion gap: 9 (ref 5–15)
BUN: 39 mg/dL — ABNORMAL HIGH (ref 8–23)
CO2: 27 mmol/L (ref 22–32)
Calcium: 8.4 mg/dL — ABNORMAL LOW (ref 8.9–10.3)
Chloride: 88 mmol/L — ABNORMAL LOW (ref 98–111)
Creatinine, Ser: 1.77 mg/dL — ABNORMAL HIGH (ref 0.61–1.24)
GFR calc Af Amer: 43 mL/min — ABNORMAL LOW (ref >60)
GFR calc non Af Amer: 37 mL/min — ABNORMAL LOW (ref >60)
Glucose, Bld: 181 mg/dL — ABNORMAL HIGH (ref 70–99)
Potassium: 3.9 mmol/L (ref 3.5–5.1)
Sodium: 124 mmol/L — ABNORMAL LOW (ref 135–145)

## 2018-10-17 LAB — GLUCOSE, CAPILLARY
Glucose-Capillary: 164 mg/dL — ABNORMAL HIGH (ref 70–99)
Glucose-Capillary: 291 mg/dL — ABNORMAL HIGH (ref 70–99)
Glucose-Capillary: 238 mg/dL — ABNORMAL HIGH (ref 70–99)
Glucose-Capillary: 171 mg/dL — ABNORMAL HIGH (ref 70–99)

## 2018-10-17 NOTE — Progress Notes (Addendum)
[place]       [place] [address]             [phone]      16 Days Post-Op Procedure(s) (LRB): CORONARY ARTERY BYPASS GRAFTING (CABG) TIMES  FOUR USING LEFT MAMMARY ARTERY AND RIGHT GREATER SAPHEANOUS VEIN HARVESTED ENDOSCOPICALLY (N/A) TRANSESOPHAGEAL ECHOCARDIOGRAM (TEE) (N/A) Subjective: Feels more anxious/jittery this am  Objective: Vital signs in last 24 hours: Temp:  [97.6 F (36.4 C)-98.1 F (36.7 C)] 97.6 F (36.4 C) (06/20 0441) Pulse Rate:  [70-74] 70 (06/20 0441) Cardiac Rhythm: Normal sinus rhythm (06/20 0815) Resp:  [20-23] 21 (06/20 0441) BP: (118-154)/(63-89) 142/63 (06/20 0441) SpO2:  [95 %-100 %] 96 % (06/20 0441) Weight:  [125.2 kg] 125.2 kg (06/20 0445)  Hemodynamic parameters for last 24 hours:    Intake/Output from previous day: 06/19 0701 - 06/20 0700 In: 367 [P.O.:367] Out: 1450 [Urine:1450] Intake/Output this shift: No intake/output data recorded.  General appearance: alert, cooperative, fatigued and no distress Heart: regular rate and rhythm Lungs: mildly dim in bases Abdomen: benign Extremities: no edema Wound: incis healing well  Lab Results: Recent Labs    10/16/18 0426  WBC 17.7*  HGB 8.9*  HCT 26.7*  PLT 343   BMET:  Recent Labs    10/16/18 0426 10/17/18 0338  NA 125* 124*  K 3.8 3.9  CL 87* 88*  CO2 27 27  GLUCOSE 173* 181*  BUN 39* 39*  CREATININE 2.00* 1.77*  CALCIUM 8.4* 8.4*    PT/INR: No results for input(s): LABPROT, INR in the last 72 hours. ABG    Component Value Date/Time   PHART 7.343 (L) 10/01/2018 1946   HCO3 21.5 10/01/2018 1946   TCO2 22 10/01/2018 2009   ACIDBASEDEF 4.0 (H) 10/01/2018 1946   O2SAT 95.0 10/01/2018 1946   CBG (last 3)  Recent Labs    10/16/18 1617 10/16/18 2122 10/17/18 0611  GLUCAP 289* 255* 164*    Meds Scheduled Meds: . amiodarone  200 mg Oral Daily  . amLODipine  5 mg Oral Daily  . aspirin EC  325 mg Oral Daily   Or  . aspirin  324  mg Per Tube Daily  . bisacodyl  10 mg Oral Daily   Or  . bisacodyl  10 mg Rectal Daily  . brimonidine  1 drop Left Eye BID  . docusate sodium  200 mg Oral Daily  . dorzolamide  1 drop Left Eye TID  . enoxaparin (LOVENOX) injection  40 mg Subcutaneous QHS  . feeding supplement (GLUCERNA SHAKE)  237 mL Oral TID BM  . insulin aspart  0-20 Units Subcutaneous TID WC  . insulin aspart  25 Units Subcutaneous TID WC  . insulin detemir  60 Units Subcutaneous BID  . mouth rinse  15 mL Mouth Rinse BID  . megestrol  40 mg Oral Daily  . metoprolol tartrate  12.5 mg Oral BID   Or  . metoprolol tartrate  12.5 mg Per Tube BID  . mirtazapine  15 mg Oral QHS  . multivitamin with minerals  1 tablet Oral Daily  . pantoprazole  40 mg Oral Daily  . potassium chloride  10 mEq Oral BID  . rosuvastatin  10 mg Oral q1800  . sodium chloride flush  3 mL Intravenous Q12H  . tamsulosin  0.4 mg Oral Daily   Continuous Infusions: . sodium chloride Stopped (10/14/18 1935)   PRN Meds:.sodium chloride, ALPRAZolam, alum & mag hydroxide-simeth, guaiFENesin,  lactulose, levalbuterol, lidocaine (PF), ondansetron (ZOFRAN) IV, oxyCODONE, polyethylene glycol, sodium chloride flush, traMADol  Xrays No results found.  Assessment/Plan: S/P Procedure(s) (LRB): CORONARY ARTERY BYPASS GRAFTING (CABG) TIMES  FOUR USING LEFT MAMMARY ARTERY AND RIGHT GREATER SAPHEANOUS VEIN HARVESTED ENDOSCOPICALLY (N/A) TRANSESOPHAGEAL ECHOCARDIOGRAM (TEE) (N/A)  1 hemodyn stable in sinus rhythm, SBP up at times, mostly well controlled. No fevers 2 sats ok on RA 3 depression and anxiety cont to play a factor in recovery- cont remeron and xanax for now. Obesity and severe metabolic syndrome also limiting- push PT as able, hopefully SNF on Monday 4 BS control remains poor, appetite/intake is actually pretty poor, monitor- hopefully will improve over time 5 BUN/Creat improved- not on   lasix at this time, hyponatremia a little worse. Some is  pseudohyponatremia d/t elevated glucose- doesn't seem to be taking in very much po fluids   LOS: 18 days    John Giovanni [facility] 10/17/2018 Pager [phone]   I have seen and examined the patient and agree with the assessment and plan as outlined.  Rexene Alberts, MD 10/17/2018 10:45 AM

## 2018-10-17 NOTE — Progress Notes (Signed)
CARDIAC REHAB PHASE I   PRE:  Rate/Rhythm: 75 SR  BP:  Sitting: 124/60        SaO2: 95% RA  Pt did not ambulate due to feeling faint. He stated he did not know if he as going to vomit or faint. Stated he wanted a wet towel. Pt feet were put up, wet towel was provided, and nurse was notified. Pt BP was fine and O2 was normal. HR was 75 SR. Symptoms resolved after feet elevated.   [place], ACSM CEP 10/17/2018  2:52 PM

## 2018-10-17 NOTE — Progress Notes (Signed)
Pt refused to walk I tried and cardiac rehab tried

## 2018-10-17 NOTE — Progress Notes (Signed)
Progress Note  Patient Name: Martin [name] [facility] Sr. Date of Encounter: 10/17/2018  Primary Cardiologist: No primary care provider on file.   Subjective   "I feel very anxious."  Inpatient Medications    Scheduled Meds: . amiodarone  200 mg Oral Daily  . amLODipine  5 mg Oral Daily  . aspirin EC  325 mg Oral Daily   Or  . aspirin  324 mg Per Tube Daily  . bisacodyl  10 mg Oral Daily   Or  . bisacodyl  10 mg Rectal Daily  . brimonidine  1 drop Left Eye BID  . docusate sodium  200 mg Oral Daily  . dorzolamide  1 drop Left Eye TID  . enoxaparin (LOVENOX) injection  40 mg Subcutaneous QHS  . feeding supplement (GLUCERNA SHAKE)  237 mL Oral TID BM  . insulin aspart  0-20 Units Subcutaneous TID WC  . insulin aspart  25 Units Subcutaneous TID WC  . insulin detemir  60 Units Subcutaneous BID  . mouth rinse  15 mL Mouth Rinse BID  . megestrol  40 mg Oral Daily  . metoprolol tartrate  12.5 mg Oral BID   Or  . metoprolol tartrate  12.5 mg Per Tube BID  . mirtazapine  15 mg Oral QHS  . multivitamin with minerals  1 tablet Oral Daily  . pantoprazole  40 mg Oral Daily  . rosuvastatin  10 mg Oral q1800  . sodium chloride flush  3 mL Intravenous Q12H  . tamsulosin  0.4 mg Oral Daily   Continuous Infusions: . sodium chloride Stopped (10/14/18 1935)   PRN Meds: sodium chloride, ALPRAZolam, alum & mag hydroxide-simeth, guaiFENesin, lactulose, levalbuterol, lidocaine (PF), ondansetron (ZOFRAN) IV, oxyCODONE, polyethylene glycol, sodium chloride flush, traMADol   Vital Signs    Vitals:   10/16/18 1153 10/16/18 2111 10/17/18 0441 10/17/18 0445  BP: (!) 154/68 118/89 (!) 142/63   Pulse: 72 74 70   Resp: 20 (!) 23 (!) 21   Temp: 98.1 F (36.7 C) 97.7 F (36.5 C) 97.6 F (36.4 C)   TempSrc: Oral Oral Oral   SpO2: 100% 95% 96%   Weight:   125.2 kg 125.2 kg  Height:        Intake/Output Summary (Last 24 hours) at 10/17/2018 1005 Last data filed at 10/17/2018 0505 Gross per 24 hour   Intake 367 ml  Output 1450 ml  Net -1083 ml   Filed Weights   10/16/18 0500 10/17/18 0441 10/17/18 0445  Weight: 124.7 kg 125.2 kg 125.2 kg    Telemetry    nsr - Personally Reviewed  ECG    nsr - Personally Reviewed  Physical Exam   GEN: No acute distress.   Neck: No JVD Cardiac: RRR, no murmurs, rubs, or gallops. Sternotomy is clean and dry. Respiratory: Clear to auscultation bilaterally. GI: Soft, nontender, non-distended  MS: No edema; No deformity. Neuro:  Nonfocal  Psych: Normal affect but appears to be anxious  Labs    Chemistry Recent Labs  Lab 10/15/18 0322 10/16/18 0426 10/17/18 0338  NA 126* 125* 124*  K 4.1 3.8 3.9  CL 87* 87* 88*  CO2 27 27 27   GLUCOSE 152* 173* 181*  BUN 40* 39* 39*  CREATININE 2.05* 2.00* 1.77*  CALCIUM 8.4* 8.4* 8.4*  GFRNONAA 31* 32* 37*  GFRAA 36* 37* 43*  ANIONGAP 12 11 9      Hematology Recent Labs  Lab 10/16/18 0426  WBC 17.7*  RBC 3.08*  HGB 8.9*  HCT 26.7*  MCV 86.7  MCH 28.9  MCHC 33.3  RDW 14.7  PLT 343    Cardiac EnzymesNo results for input(s): TROPONINI in the last 168 hours. No results for input(s): TROPIPOC in the last 168 hours.   BNPNo results for input(s): BNP, PROBNP in the last 168 hours.   DDimer No results for input(s): DDIMER in the last 168 hours.   Radiology    No results found.  Cardiac Studies   none  Patient Profile     73 y.o. male admitted for CABG, with post op atrial fib and anxiety, now back to NSR on amiodarone.  Assessment & Plan    1. Pre/Post op atrial fib - continue amiodarone 200 mg daily. Uptitrate for more atrial fib. 2. Hyponatremia - consider reducing his free water allotment. 3. Anxiety - he is quite anxious. I will defer to Dr. Mariana Kaufman.      For questions or updates, please contact [place] Please consult [email or web address] for contact info under Cardiology/STEMI.      Signed, Cristopher Peru, MD  10/17/2018, 10:05 AM  Patient ID: Lesle Reek., male    DOB: May 20, 1945, 73 y.o.   MRN: [id number]

## 2018-10-18 LAB — GLUCOSE, CAPILLARY
Glucose-Capillary: 116 mg/dL — ABNORMAL HIGH (ref 70–99)
Glucose-Capillary: 232 mg/dL — ABNORMAL HIGH (ref 70–99)
Glucose-Capillary: 165 mg/dL — ABNORMAL HIGH (ref 70–99)
Glucose-Capillary: 49 mg/dL — ABNORMAL LOW (ref 70–99)
Glucose-Capillary: 122 mg/dL — ABNORMAL HIGH (ref 70–99)
Glucose-Capillary: 117 mg/dL — ABNORMAL HIGH (ref 70–99)

## 2018-10-18 LAB — BASIC METABOLIC PANEL
Anion gap: 10 (ref 5–15)
BUN: 33 mg/dL — ABNORMAL HIGH (ref 8–23)
CO2: 27 mmol/L (ref 22–32)
Calcium: 8.6 mg/dL — ABNORMAL LOW (ref 8.9–10.3)
Chloride: 91 mmol/L — ABNORMAL LOW (ref 98–111)
Creatinine, Ser: 1.51 mg/dL — ABNORMAL HIGH (ref 0.61–1.24)
GFR calc Af Amer: 52 mL/min — ABNORMAL LOW (ref >60)
GFR calc non Af Amer: 45 mL/min — ABNORMAL LOW (ref >60)
Glucose, Bld: 119 mg/dL — ABNORMAL HIGH (ref 70–99)
Potassium: 3.9 mmol/L (ref 3.5–5.1)
Sodium: 128 mmol/L — ABNORMAL LOW (ref 135–145)

## 2018-10-18 MED ORDER — DEXTROSE 50 % IV SOLN
INTRAVENOUS | Status: AC
  Administered 2018-10-18: 30 mL
  Filled 2018-10-18: qty 50

## 2018-10-18 NOTE — Progress Notes (Addendum)
[address] FordSuite [address]       [place] [address]             [phone]      17 Days Post-Op Procedure(s) (LRB): CORONARY ARTERY BYPASS GRAFTING (CABG) TIMES  FOUR USING LEFT MAMMARY ARTERY AND RIGHT GREATER SAPHEANOUS VEIN HARVESTED ENDOSCOPICALLY (N/A) TRANSESOPHAGEAL ECHOCARDIOGRAM (TEE) (N/A) Subjective: conts to struggle with emotions, very anxious about recovery and long term health  Objective: Vital signs in last 24 hours: Temp:  [97.7 F (36.5 C)-98.5 F (36.9 C)] 97.7 F (36.5 C) (06/21 0512) Pulse Rate:  [76-82] 82 (06/21 0512) Cardiac Rhythm: Normal sinus rhythm (06/21 0851) Resp:  [21-23] 23 (06/21 0512) BP: (133-145)/(58-68) 145/68 (06/21 0512) SpO2:  [95 %-96 %] 95 % (06/21 0512) Weight:  [121.1 kg] 121.1 kg (06/21 0512)  Hemodynamic parameters for last 24 hours:    Intake/Output from previous day: 06/20 0701 - 06/21 0700 In: 920 [P.O.:920] Out: 2375 [Urine:2375] Intake/Output this shift: No intake/output data recorded.  General appearance: alert, cooperative and no distress Heart: regular rate and rhythm Lungs: mildly dim in bases Abdomen: benign Extremities: no edema Wound: incis healing well  Lab Results: Recent Labs    10/16/18 0426  WBC 17.7*  HGB 8.9*  HCT 26.7*  PLT 343   BMET:  Recent Labs    10/17/18 0338 10/18/18 0411  NA 124* 128*  K 3.9 3.9  CL 88* 91*  CO2 27 27  GLUCOSE 181* 119*  BUN 39* 33*  CREATININE 1.77* 1.51*  CALCIUM 8.4* 8.6*    PT/INR: No results for input(s): LABPROT, INR in the last 72 hours. ABG    Component Value Date/Time   PHART 7.343 (L) 10/01/2018 1946   HCO3 21.5 10/01/2018 1946   TCO2 22 10/01/2018 2009   ACIDBASEDEF 4.0 (H) 10/01/2018 1946   O2SAT 95.0 10/01/2018 1946   CBG (last 3)  Recent Labs    10/17/18 1636 10/17/18 2104 10/18/18 0604  GLUCAP 238* 171* 116*    Meds Scheduled Meds:  amiodarone  200 mg Oral Daily   amLODipine  5 mg Oral Daily   aspirin EC  325  mg Oral Daily   Or   aspirin  324 mg Per Tube Daily   bisacodyl  10 mg Oral Daily   Or   bisacodyl  10 mg Rectal Daily   brimonidine  1 drop Left Eye BID   docusate sodium  200 mg Oral Daily   dorzolamide  1 drop Left Eye TID   enoxaparin (LOVENOX) injection  40 mg Subcutaneous QHS   feeding supplement (GLUCERNA SHAKE)  237 mL Oral TID BM   insulin aspart  0-20 Units Subcutaneous TID WC   insulin aspart  25 Units Subcutaneous TID WC   insulin detemir  60 Units Subcutaneous BID   mouth rinse  15 mL Mouth Rinse BID   megestrol  40 mg Oral Daily   metoprolol tartrate  12.5 mg Oral BID   Or   metoprolol tartrate  12.5 mg Per Tube BID   mirtazapine  15 mg Oral QHS   multivitamin with minerals  1 tablet Oral Daily   pantoprazole  40 mg Oral Daily   rosuvastatin  10 mg Oral q1800   sodium chloride flush  3 mL Intravenous Q12H   tamsulosin  0.4 mg Oral Daily   Continuous Infusions:  sodium chloride Stopped (10/14/18 1935)   PRN Meds:.sodium chloride, ALPRAZolam, alum & mag hydroxide-simeth, guaiFENesin, lactulose, levalbuterol,  lidocaine (PF), ondansetron (ZOFRAN) IV, oxyCODONE, polyethylene glycol, sodium chloride flush, traMADol  Xrays No results found.  Assessment/Plan: S/P Procedure(s) (LRB): CORONARY ARTERY BYPASS GRAFTING (CABG) TIMES  FOUR USING LEFT MAMMARY ARTERY AND RIGHT GREATER SAPHEANOUS VEIN HARVESTED ENDOSCOPICALLY (N/A) TRANSESOPHAGEAL ECHOCARDIOGRAM (TEE) (N/A)  1 conts slow recovery 2 afebrile, hemodyn stable in sinus rhythm 3 sats good on RA 4 hyponatremia trend improving, BS a little better controlled , BUN/Creat also trending better 5 anxiety and depression limiting recovery, but is slowly improving- hopefully to SNF/rehab soon  LOS: 19 days    Martin [name] [facility] 10/18/2018 Pager [phone]  I have seen and examined the patient and agree with the assessment and plan as outlined.  Rexene Alberts, MD 10/18/2018 11:18 AM

## 2018-10-18 NOTE — Discharge Instructions (Signed)
Coronary Artery Bypass Grafting, Care After This sheet gives you information about how to care for yourself after your procedure. Your doctor may also give you more specific instructions. If you have problems or questions, contact your doctor. Follow these instructions at home: Medicines  Take over-the-counter and prescription medicines only as told by your doctor. Do not stop taking medicines or start any new medicines unless your doctor says it is okay.  If you were prescribed an antibiotic medicine, take it as told by your doctor. Do not stop taking the antibiotic even if you start to feel better.  Do not drive or use heavy machinery while taking prescription pain medicine. Incision care      Follow instructions from your doctor about how to take care of your incisions. Make sure you: ? Wash your hands with soap and water before you change your bandage (dressing). If you cannot use soap and water, use hand sanitizer. ? Change your dressing as told by your doctor. ? Leave stitches (sutures), skin glue, or skin tape (adhesive) strips in place. They may need to stay in place for 2 weeks or longer. If tape strips get loose and curl up, you may trim the loose edges. Do not remove tape strips completely unless your doctor says it is okay.  Make sure the incisions are clean, dry, and protected.  Check your incision areas every day for signs of infection. Check for: ? Redness, swelling, or pain. ? Fluid or blood. ? Warmth. ? Pus or a bad smell.  If cuts were made in your legs: ? Avoid crossing your legs. ? Avoid sitting for long periods of time. Change positions every 30 minutes. ? Raise (elevate) your legs when you are sitting. Bathing  Do not take baths, swim, or use a hot tub until your doctor says it is okay.  Only take sponge baths. Pat the incisions dry. Do not rub the incisions to dry.  Ask your doctor when you can shower. Eating and drinking  Eat foods that are high in  fiber, such as raw fruits and vegetables, whole grains, beans, and nuts. Meats should be lean cut. Avoid canned, processed, and fried foods. This can help prevent constipation. This is also a recommended part of a heart-healthy diet.  Drink enough fluid to keep your urine clear or pale yellow.  Limit alcohol intake to no more than 1 drink a day for nonpregnant women and 2 drinks a day for men. One drink equals 12 oz of beer, 5 oz of wine, or 1 oz of hard liquor. Activity  Rest and limit your activity as told by your doctor. You may be told to: ? Stop any activity right away if you have chest pain, shortness of breath, irregular heartbeats, or dizziness. Get help right away if you have any of these symptoms. ? Move around often for short periods or take short walks as told by your doctor. Slowly increase your activities. You may need physical therapy or cardiac rehabilitation. ? Avoid lifting, pushing, or pulling anything that is heavier than 10 lb (4.5 kg) for at least 6 weeks or as told by your doctor.  Do not drive until your doctor says it is okay.  Ask your doctor when you can go back to work.  Ask your doctor when you can be sexually active. General instructions   Do not use any products that contain nicotine or tobacco, such as cigarettes and e-cigarettes. If you need help quitting, ask your doctor.    Take 2-3 deep breaths every few hours during the day while you get better. This helps expand your lungs and prevent complications.  If you were given a device called an incentive spirometer, use it several times a day to practice deep breathing. Support your chest with a pillow or your arms when you take deep breaths or cough.  Wear compression stockings as told by your doctor. These stockings help to prevent blood clots and reduce swelling in your legs.  Weigh yourself every day. This helps to see if your body is holding (retaining) fluid that may make your heart and lungs work  harder.  Keep all follow-up visits as told by your doctor. This is important. Contact a doctor if:  You have more redness, swelling, or pain around any cut.  You have more fluid or blood coming from any cut.  Any cut feels warm to the touch.  You have pus or a bad smell coming from any cut.  You have a fever.  You have swelling in your ankles or legs.  You have pain in your legs.  You gain 2 or more pounds (0.9 kg) a day.  You feel sick to your stomach (nauseous) or throw up (vomit).  You have watery poop (diarrhea). Get help right away if:  You have chest pain that goes to your jaw or arms.  You are short of breath.  You have a fast or irregular heartbeat.  You notice a "clicking" in your breastbone (sternum) when you move.  You feel numb or weak in your arms or legs.  You feel dizzy or light-headed. Summary  After the procedure, it is common to have pain or discomfort in the incision areas.  Do not take baths, swim, or use a hot tub until your health care provider approves.  Slowly increase your activities. You may need physical therapy or cardiac rehabilitation.  Weigh yourself every day. This helps to see if your body is holding (retaining) fluid that may make your heart and lungs work harder. This information is not intended to replace advice given to you by your health care provider. Make sure you discuss any questions you have with your health care provider. Document Released: 04/20/2013 Document Revised: 05/28/2016 Document Reviewed: 05/28/2016 Elsevier Interactive Patient Education  2019 [employer]vier Inc.  

## 2018-10-19 ENCOUNTER — Other Ambulatory Visit: Payer: Self-pay | Admitting: Internal Medicine

## 2018-10-19 DIAGNOSIS — I2111 ST elevation (STEMI) myocardial infarction involving right coronary artery: Secondary | ICD-10-CM | POA: Diagnosis not present

## 2018-10-19 DIAGNOSIS — Z951 Presence of aortocoronary bypass graft: Secondary | ICD-10-CM | POA: Diagnosis not present

## 2018-10-19 DIAGNOSIS — N400 Enlarged prostate without lower urinary tract symptoms: Secondary | ICD-10-CM | POA: Diagnosis not present

## 2018-10-19 DIAGNOSIS — I739 Peripheral vascular disease, unspecified: Secondary | ICD-10-CM | POA: Diagnosis not present

## 2018-10-19 DIAGNOSIS — R531 Weakness: Secondary | ICD-10-CM | POA: Diagnosis not present

## 2018-10-19 DIAGNOSIS — I700 Atherosclerosis of aorta: Secondary | ICD-10-CM | POA: Diagnosis not present

## 2018-10-19 DIAGNOSIS — I4891 Unspecified atrial fibrillation: Secondary | ICD-10-CM | POA: Diagnosis not present

## 2018-10-19 DIAGNOSIS — Z7401 Bed confinement status: Secondary | ICD-10-CM | POA: Diagnosis not present

## 2018-10-19 DIAGNOSIS — N179 Acute kidney failure, unspecified: Secondary | ICD-10-CM | POA: Diagnosis not present

## 2018-10-19 DIAGNOSIS — R2681 Unsteadiness on feet: Secondary | ICD-10-CM | POA: Diagnosis not present

## 2018-10-19 DIAGNOSIS — Z48812 Encounter for surgical aftercare following surgery on the circulatory system: Secondary | ICD-10-CM | POA: Diagnosis not present

## 2018-10-19 DIAGNOSIS — M2550 Pain in unspecified joint: Secondary | ICD-10-CM | POA: Diagnosis not present

## 2018-10-19 DIAGNOSIS — I9789 Other postprocedural complications and disorders of the circulatory system, not elsewhere classified: Secondary | ICD-10-CM | POA: Diagnosis not present

## 2018-10-19 DIAGNOSIS — I25110 Atherosclerotic heart disease of native coronary artery with unstable angina pectoris: Secondary | ICD-10-CM | POA: Diagnosis not present

## 2018-10-19 DIAGNOSIS — E1139 Type 2 diabetes mellitus with other diabetic ophthalmic complication: Secondary | ICD-10-CM | POA: Diagnosis not present

## 2018-10-19 DIAGNOSIS — E118 Type 2 diabetes mellitus with unspecified complications: Secondary | ICD-10-CM | POA: Diagnosis not present

## 2018-10-19 DIAGNOSIS — F339 Major depressive disorder, recurrent, unspecified: Secondary | ICD-10-CM | POA: Diagnosis not present

## 2018-10-19 DIAGNOSIS — R41841 Cognitive communication deficit: Secondary | ICD-10-CM | POA: Diagnosis not present

## 2018-10-19 DIAGNOSIS — I10 Essential (primary) hypertension: Secondary | ICD-10-CM | POA: Diagnosis not present

## 2018-10-19 DIAGNOSIS — E1159 Type 2 diabetes mellitus with other circulatory complications: Secondary | ICD-10-CM | POA: Diagnosis not present

## 2018-10-19 DIAGNOSIS — Z794 Long term (current) use of insulin: Secondary | ICD-10-CM | POA: Diagnosis not present

## 2018-10-19 DIAGNOSIS — E1165 Type 2 diabetes mellitus with hyperglycemia: Secondary | ICD-10-CM | POA: Diagnosis not present

## 2018-10-19 DIAGNOSIS — D62 Acute posthemorrhagic anemia: Secondary | ICD-10-CM | POA: Diagnosis not present

## 2018-10-19 DIAGNOSIS — Z20828 Contact with and (suspected) exposure to other viral communicable diseases: Secondary | ICD-10-CM | POA: Diagnosis not present

## 2018-10-19 DIAGNOSIS — M6281 Muscle weakness (generalized): Secondary | ICD-10-CM | POA: Diagnosis not present

## 2018-10-19 DIAGNOSIS — D649 Anemia, unspecified: Secondary | ICD-10-CM | POA: Diagnosis not present

## 2018-10-19 LAB — GLUCOSE, CAPILLARY
Glucose-Capillary: 142 mg/dL — ABNORMAL HIGH (ref 70–99)
Glucose-Capillary: 193 mg/dL — ABNORMAL HIGH (ref 70–99)
Glucose-Capillary: 185 mg/dL — ABNORMAL HIGH (ref 70–99)
Glucose-Capillary: 179 mg/dL — ABNORMAL HIGH (ref 70–99)

## 2018-10-19 MED ORDER — AMIODARONE HCL 200 MG PO TABS: 200.0000 mg | ORAL_TABLET | Freq: Every day | ORAL | Status: DC

## 2018-10-19 MED ORDER — GUAIFENESIN ER 600 MG PO TB12: 1200.0000 mg | ORAL_TABLET | Freq: Two times a day (BID) | ORAL | Status: DC | PRN

## 2018-10-19 MED ORDER — ASPIRIN 325 MG PO TBEC: 325.0000 mg | DELAYED_RELEASE_TABLET | Freq: Every day | ORAL | 0 refills | Status: DC

## 2018-10-19 MED ORDER — OXYCODONE HCL 5 MG PO TABS: 5.0000 mg | ORAL_TABLET | ORAL | 0 refills | Status: DC | PRN

## 2018-10-19 MED ORDER — AMLODIPINE BESYLATE 5 MG PO TABS: 5.0000 mg | ORAL_TABLET | Freq: Every day | ORAL | Status: DC

## 2018-10-19 MED ORDER — MIRTAZAPINE 15 MG PO TBDP: 15.0000 mg | ORAL_TABLET | Freq: Every day | ORAL | Status: DC

## 2018-10-19 MED ORDER — GLUCERNA SHAKE PO LIQD: 237.0000 mL | Freq: Three times a day (TID) | ORAL | 0 refills | Status: DC

## 2018-10-19 MED ORDER — METOPROLOL TARTRATE 25 MG PO TABS: 12.5000 mg | ORAL_TABLET | Freq: Two times a day (BID) | ORAL | Status: DC

## 2018-10-19 MED ORDER — TAMSULOSIN HCL 0.4 MG PO CAPS: 0.4000 mg | ORAL_CAPSULE | Freq: Every day | ORAL | Status: DC

## 2018-10-19 NOTE — TOC Transition Note (Signed)
Transition of Care [facility]) - CM/SW Discharge Note   Patient Details  Name: Martin Levario [facility] MRN: [id number] Date of Birth: 12/08/1945  [facility]) CM/SW Contact:  Vinie Sill, [place] Phone Number: 10/19/2018, 3:57 PM   Clinical Narrative:    Patient will DC to: [place] Date: 10/19/2018 Family Notified: Martin [name] , spouse  Transport By: Corey Harold  RN, patient, and facility notified of DC. Discharge Summary sent to facility. RN given number for report [phone], Room 110.  Ambulance transport requested for patient.   Clinical Social Worker signing off.  Thurmond Butts, MSW, [facility] Clinical Social Worker [phone]      Final next level of care: Skilled Nursing Facility Barriers to Discharge: Barriers Resolved   Patient Goals and CMS Choice Patient states their goals for this hospitalization and ongoing recovery are:: Pt is agreeable to going to rehab CMS Medicare.gov Compare Post Acute Care list provided to:: Patient Represenative (must comment) Choice offered to / list presented to : Spouse  Discharge Placement PASRR number recieved: 10/12/18            Patient chooses bed at: [facility] Patient to be transferred to facility by: [place] Name of family member notified: Martin [name], spouse Patient and family notified of of transfer: 10/19/18  Discharge Plan and Services In-house Referral: Clinical Social Work Discharge Planning Services: CM Consult Post Acute Care Choice: NA(undecided at this time)                               Social Determinants of Health (SDOH) Interventions     Readmission Risk Interventions No flowsheet data found.

## 2018-10-19 NOTE — Progress Notes (Signed)
Patient in a stable condition discharge education reviewed with patient he verbalized understanding, tele dc ccmd notified, patient belongings at bedside, patient's wife updated via phone, awaiting PTAR for transportation to [facility]

## 2018-10-19 NOTE — Progress Notes (Signed)
Report called to nurse [facility] at [facility]

## 2018-10-19 NOTE — Progress Notes (Signed)
[place] to see pt to walk. Pt stated he has appointment with PT to see as next pt. Will let PT see pt as he is more appropriate for their services.  Will continue to follow. Graylon Good RN BSN 10/19/2018 2:00 PM

## 2018-10-19 NOTE — Progress Notes (Addendum)
      [address]       [place] [address]             [phone]      18 Days Post-Op Procedure(s) (LRB): CORONARY ARTERY BYPASS GRAFTING (CABG) TIMES  FOUR USING LEFT MAMMARY ARTERY AND RIGHT GREATER SAPHEANOUS VEIN HARVESTED ENDOSCOPICALLY (N/A) TRANSESOPHAGEAL ECHOCARDIOGRAM (TEE) (N/A)   Subjective:  No new complaints.  States he is not getting much to eat.  He doesn't have much energy which he feels is due to lack of food options.  He is ambulating some.  Having a lot of issues with anxiety.  Objective: Vital signs in last 24 hours: Temp:  [97.9 F (36.6 C)-98.5 F (36.9 C)] 98.4 F (36.9 C) (06/22 0736) Pulse Rate:  [72-80] 78 (06/22 0736) Cardiac Rhythm: Normal sinus rhythm (06/22 0700) Resp:  [20-25] 20 (06/22 0736) BP: (110-141)/(49-70) 110/61 (06/22 0736) SpO2:  [96 %-97 %] 97 % (06/22 0736) Weight:  [657 kg] 121 kg (06/22 0300)  Intake/Output from previous day: 06/21 0701 - 06/22 0700 In: 596 [P.O.:596] Out: 2250 [Urine:2250]  General appearance: alert, cooperative and no distress Heart: regular rate and rhythm Lungs: clear to auscultation bilaterally Abdomen: soft, non-tender; bowel sounds normal; no masses,  no organomegaly Extremities: edema trace Wound: clean and dry  Lab Results: No results for input(s): WBC, HGB, HCT, PLT in the last 72 hours. BMET:  Recent Labs    10/17/18 0338 10/18/18 0411  NA 124* 128*  K 3.9 3.9  CL 88* 91*  CO2 27 27  GLUCOSE 181* 119*  BUN 39* 33*  CREATININE 1.77* 1.51*  CALCIUM 8.4* 8.6*    PT/INR: No results for input(s): LABPROT, INR in the last 72 hours. ABG    Component Value Date/Time   PHART 7.343 (L) 10/01/2018 1946   HCO3 21.5 10/01/2018 1946   TCO2 22 10/01/2018 2009   ACIDBASEDEF 4.0 (H) 10/01/2018 1946   O2SAT 95.0 10/01/2018 1946   CBG (last 3)  Recent Labs    10/18/18 2249 10/19/18 0353 10/19/18 0606  GLUCAP 117* 142* 193*    Assessment/Plan: S/P Procedure(s) (LRB):  CORONARY ARTERY BYPASS GRAFTING (CABG) TIMES  FOUR USING LEFT MAMMARY ARTERY AND RIGHT GREATER SAPHEANOUS VEIN HARVESTED ENDOSCOPICALLY (N/A) TRANSESOPHAGEAL ECHOCARDIOGRAM (TEE) (N/A)  1. CV- hemodynamically stable in NSR- on Amiodarone, Norvasc, Lopressor 2. Pulm- no acute issues, off oxygen 3. Renal- creatinine improved at 1.5, weight is below baseline, no lasix at this time 4. GI- patient states he isn't getting enough food, megace was started last week, will adjust diet to carb modified with fluid restrictions to see if he is able to get more food at meal time... he is attributing this to his lack of energy 5. Anxiety/depression- continue current regimen 6.DM- sugars still running a little high, poor oral intake at dinner last night likely reason for sugar of 49, continue current regimen, monitor 7. Deconditioning- patient requires a lot of motivation, will need SNF 8. Dispo- patient stable, creatinine down to 1.5, adjusted diet to hopefully get patient more food, which he feels would help his energy level, continue anxiety/depression meds, continue PT/OT.. for SNF when medically stable for discharge  LOS: 20 days    Ellwood Handler 10/19/2018 Patient seen and examined, agree with above Creatinine back to baseline- will dc fluid restriction  Remo Lipps C. Roxan Hockey, MD [facility] [phone]

## 2018-10-19 NOTE — Progress Notes (Signed)
Patient discharged to [facility] transported  by [employer]

## 2018-10-19 NOTE — Progress Notes (Signed)
Pt called out at 2115 needing a med for anxiety.  Nurse found the pt had broken out in a sweat, limbs trembling, and stated he "feels like he's dying."  Quickly checked the pt's blood glucose which was 49. Nurse quickly administered 30 mL of IV Dextrose amp and encouraged the pt to drink a cup of orange juice with four packs of sugar added.  Twenty minutes later, the new blood glucose was 122. Pt was now calm and had stopped sweating and shaking.  Nurse decided to hold off scheduled [place] until blood sugar was rechecked.  An hour later the BG was 117.  Decided to hold the [place] and encouraged the pt to eat a snack of peaches.  Pt now resting comfortably in bed.  Will continue to monitor.  Lupita Dawn, RN

## 2018-10-19 NOTE — Care Management Important Message (Signed)
Important Message  Patient Details  Name: Martin [facility]geb [facility] Sr. MRN: [id number] Date of Birth: 06/14/1945   Medicare Important Message Given:  Yes     [name] 10/19/2018, 1:09 PM

## 2018-10-19 NOTE — Plan of Care (Signed)
Goals met 

## 2018-10-19 NOTE — Progress Notes (Signed)
Pt refused to ambulate this morning.  Lupita Dawn, RN

## 2018-10-19 NOTE — Progress Notes (Signed)
Physical Therapy Treatment Patient Details Name: Martin [facility] Sr. 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  On 6/4, pt s/p CABG x3    PT Comments    Pt continues to have anxiety/panic attacks that limit his ability to push himself to do more.  Emphasis on reinforcing sternal precautions during transitions, gait stability and endurance and trying to redirect pt out of his anxiety attacks.    Follow Up Recommendations  SNF;Supervision/Assistance - 24 hour     Equipment Recommendations  Other (comment)    Recommendations for Other Services       Precautions / Restrictions Precautions Precautions: Sternal    Mobility  Bed Mobility Overal bed mobility: Needs Assistance Bed Mobility: Sidelying to Sit   Sidelying to sit: Min assist       General bed mobility comments: assisted more for direction and initiation/follow through  Transfers Overall transfer level: Needs assistance Equipment used: 4-wheeled walker Transfers: Sit to/from Stand Sit to Stand: Min assist         General transfer comment: cueing for sternal precautions, hand placement and technique; mod assist to power up from chair and EOB with max cueing to maintain forward positioning.    Ambulation/Gait Ambulation/Gait assistance: Min guard;Min assist Gait Distance (Feet): 30 Feet Assistive device: 4-wheeled walker Gait Pattern/deviations: Step-through pattern Gait velocity: slow   General Gait Details: slower, tremulous steps.  Pt clearly starting to get panicky and stopped.  Pt sat 26 min trying to get up the "nerve" to try again without success.   Stairs             Wheelchair Mobility    Modified Rankin (Stroke Patients Only)       Balance Overall balance assessment: Needs  assistance   Sitting balance-Leahy Scale: Fair     Standing balance support: Bilateral upper extremity supported;Single extremity supported;No upper extremity supported Standing balance-Leahy Scale: Poor Standing balance comment: relaint on support                            Cognition Arousal/Alertness: Awake/alert Behavior During Therapy: Flat affect Overall Cognitive Status: Impaired/Different from baseline                             Awareness: Emergent Problem Solving: Slow processing        Exercises      General Comments General comments (skin integrity, edema, etc.): Much of the treatment trying to work through his fears, but without much success today.      Pertinent Vitals/Pain Pain Assessment: Faces Faces Pain Scale: No hurt Pain Intervention(s): Monitored during session    Home Living                      Prior Function            PT Goals (current goals can now be found in the care plan section) Acute Rehab PT Goals Patient Stated Goal: Get my Independence back PT Goal Formulation: With patient Time For Goal Achievement: 10/20/18 Potential to Achieve Goals: Fair Progress towards PT goals: Not progressing toward goals - comment(anxiety/panic hindering progress)    Frequency  Min 3X/week      PT Plan Current plan remains appropriate    Co-evaluation              AM-PAC PT "6 Clicks" Mobility   Outcome Measure  Help needed turning from your back to your side while in a flat bed without using bedrails?: None Help needed moving from lying on your back to sitting on the side of a flat bed without using bedrails?: None Help needed moving to and from a bed to a chair (including a wheelchair)?: A Little Help needed standing up from a chair using your arms (e.g., wheelchair or bedside chair)?: A Little Help needed to walk in hospital room?: A Little Help needed climbing 3-5 steps with a railing? : A Little 6  Click Score: 20    End of Session   Activity Tolerance: Patient limited by fatigue(anxiety) Patient left: in bed;with call bell/phone within reach;with bed alarm set Nurse Communication: Mobility status PT Visit Diagnosis: Other abnormalities of gait and mobility (R26.89);Difficulty in walking, not elsewhere classified (R26.2)     Time: 9735-3299 PT Time Calculation (min) (ACUTE ONLY): 48 min  Charges:  $Gait Training: 8-22 mins $Therapeutic Activity: 23-37 mins                     10/19/2018  Donnella Sham, PT Acute Rehabilitation Services [phone]  (pager) [phone]  (office)   Martin [name] 10/19/2018, 4:29 PM

## 2018-10-20 ENCOUNTER — Non-Acute Institutional Stay (SKILLED_NURSING_FACILITY): Payer: PPO | Admitting: Internal Medicine

## 2018-10-20 ENCOUNTER — Telehealth (HOSPITAL_COMMUNITY): Payer: Self-pay

## 2018-10-20 ENCOUNTER — Other Ambulatory Visit: Payer: Self-pay | Admitting: Internal Medicine

## 2018-10-20 ENCOUNTER — Encounter: Payer: Self-pay | Admitting: Internal Medicine

## 2018-10-20 DIAGNOSIS — Z951 Presence of aortocoronary bypass graft: Secondary | ICD-10-CM | POA: Diagnosis not present

## 2018-10-20 DIAGNOSIS — E1159 Type 2 diabetes mellitus with other circulatory complications: Secondary | ICD-10-CM | POA: Diagnosis not present

## 2018-10-20 DIAGNOSIS — N400 Enlarged prostate without lower urinary tract symptoms: Secondary | ICD-10-CM

## 2018-10-20 DIAGNOSIS — I10 Essential (primary) hypertension: Secondary | ICD-10-CM

## 2018-10-20 DIAGNOSIS — I4891 Unspecified atrial fibrillation: Secondary | ICD-10-CM

## 2018-10-20 DIAGNOSIS — D62 Acute posthemorrhagic anemia: Secondary | ICD-10-CM | POA: Diagnosis not present

## 2018-10-20 DIAGNOSIS — E1165 Type 2 diabetes mellitus with hyperglycemia: Secondary | ICD-10-CM

## 2018-10-20 DIAGNOSIS — I2111 ST elevation (STEMI) myocardial infarction involving right coronary artery: Secondary | ICD-10-CM | POA: Diagnosis not present

## 2018-10-20 DIAGNOSIS — E1139 Type 2 diabetes mellitus with other diabetic ophthalmic complication: Secondary | ICD-10-CM

## 2018-10-20 DIAGNOSIS — I9789 Other postprocedural complications and disorders of the circulatory system, not elsewhere classified: Secondary | ICD-10-CM | POA: Diagnosis not present

## 2018-10-20 DIAGNOSIS — F339 Major depressive disorder, recurrent, unspecified: Secondary | ICD-10-CM | POA: Diagnosis not present

## 2018-10-20 DIAGNOSIS — I152 Hypertension secondary to endocrine disorders: Secondary | ICD-10-CM

## 2018-10-20 DIAGNOSIS — IMO0002 Reserved for concepts with insufficient information to code with codable children: Secondary | ICD-10-CM

## 2018-10-20 MED ORDER — OXYCODONE HCL 5 MG PO TABS: 5.0000 mg | ORAL_TABLET | ORAL | 0 refills | Status: DC | PRN

## 2018-10-20 NOTE — Telephone Encounter (Signed)
Referral signed and received from Dr. Claiborne Billings for this pt to participate in Cardiac Rehab.  Due to the departmental closure based on national recommendations for Covid-19 we are unable to provide group exercise at this time. Pt is aware of the closure and the option to participate in virtual cardiac rehab at no cost to the patient from phase I rehab staff. Option also for hybrid cardiac rehab for in facility visits/session. Insurance benefits and eligibility determined will be shared with the patient for any in facility visits/sessions. Follow up appt on 11/03/2018. Continue to follow pt.

## 2018-10-20 NOTE — Progress Notes (Signed)
: Provider:  Hennie Duos., MD Location:  [facility] Room Number: 110-P Place of Service:  SNF ([id number])  PCP: Marin Olp, MD Patient Care Team: Marin Olp, MD as PCP - General (Family Medicine)  Extended Emergency Contact Information Primary Emergency Contact: Suk,Ruth S Address: [address]          [place], [place] Phone: [phone] Work Phone: [phone] Relation: Spouse     Allergies: Simvastatin  Chief Complaint  Patient presents with   New Admit To SNF    New admission to [facility] SNF     HPI: Patient is a 73 y.o. male with severe diabetes mellitus type 2, hypertension, who presented to [facility] emergency department on 6/2 with a STEMI.  The patient been traveling to [facility] earlier in the day when he developed sudden onset of chest tightness with radiation to his left arm.  He turned around to come back to [place] drove to his home and called for EMS who transported him to the emergency department.  Patient denied any shortness of breath lower extremity edema orthopnea, PND, dizziness or syncope but noted for approximately 10 to 15 years he had occasional chest tightness that he associated with stressful situations which would resolve on its own.  He had a stress test approximately 3 years ago reports that it was normal.  Patient with a long history of diabetes mellitus for the hemoglobin A1c that runs in the 6-8 range.  Remote history of tobacco abuse but quit many years ago.  Patient was admitted to [facility] from 6/20-22 for further evaluation and treatment.  Patient was taken emergently to the Cath Lab and was found to have severe three-vessel disease.  He was medically stabilized and a CT surgical consult was obtained.  On 6/4 patient was taken the operating room where he underwent a CABG.  Patient had a prolonged postoperative course probably secondary to his obesity  and significant deconditioning.  Postop he did have an episode of atrial fibrillation which was successfully chemically cardioverted patient was placed on amiodarone and a beta-blocker.  Patient had difficulty with postop atelectasis which required aggressive pulmonary toilet and nebs.  He had some postop renal insufficiency requiring renal dosing dopamine which was weaned over time.  He responded to diuretics and his discharge creatinine is 1.51.  He also had acute blood loss anemia which required transfusion.  His blood sugars were difficult to manage requiring frequent adjustment of his insulin.  This is not stated in the discharge summary but according to the patient he was put into insulin shock after they gave him his home dose of 150 units, which was decreased after that.  Patient is quite wary of taking any long-term insulin at this point.  Patient Is admitted to skilled nursing for OT/PT.  While at skilled nursing facility patient will be followed for hypertension treated with Norvasc metoprolol,, BPH treated with Flomax, and depression treated with Remeron.  Past Medical History:  Diagnosis Date   Diabetes mellitus    Hypertension    Tobacco abuse     Past Surgical History:  Procedure Laterality Date   CORONARY ARTERY BYPASS GRAFT N/A 10/01/2018   Procedure: CORONARY ARTERY BYPASS GRAFTING (CABG) TIMES  FOUR USING LEFT MAMMARY ARTERY AND RIGHT GREATER SAPHEANOUS VEIN HARVESTED ENDOSCOPICALLY;  Surgeon: Melrose Nakayama, MD;  Location: [place];  Service: Open Heart Surgery;  Laterality: N/A;  HERNIA REPAIR     >10 years ago   IR RADIOLOGIST EVAL & MGMT  12/24/2016   IR THORACENTESIS ASP PLEURAL SPACE W/IMG GUIDE  10/06/2018   LEFT HEART CATH AND CORONARY ANGIOGRAPHY N/A 09/29/2018   Procedure: LEFT HEART CATH AND CORONARY ANGIOGRAPHY;  Surgeon: Troy Sine, MD;  Location: [place] CV LAB;  Service: Cardiovascular;  Laterality: N/A;   right shoulder surgery     around 2014     TEE WITHOUT CARDIOVERSION N/A 10/01/2018   Procedure: TRANSESOPHAGEAL ECHOCARDIOGRAM (TEE);  Surgeon: Melrose Nakayama, MD;  Location: [place];  Service: Open Heart Surgery;  Laterality: N/A;    Allergies as of 10/20/2018      Reactions   Simvastatin Diarrhea      Medication List       Accurate as of October 20, 2018  9:16 AM. If you have any questions, ask your nurse or doctor.        STOP taking these medications   ofloxacin 0.3 % ophthalmic solution Commonly known as: OCUFLOX Stopped by: Inocencio Homes, MD     TAKE these medications   amiodarone 200 MG tablet Commonly known as: PACERONE Take 1 tablet (200 mg total) by mouth daily.   amLODipine 5 MG tablet Commonly known as: NORVASC Take 1 tablet (5 mg total) by mouth daily.   aspirin 325 MG EC tablet Take 1 tablet (325 mg total) by mouth daily.   Besivance 0.6 % Susp Generic drug: Besifloxacin HCl Place 1 drop into the left eye 3 (three) times daily.   brimonidine 0.2 % ophthalmic solution Commonly known as: ALPHAGAN Place 1 drop into the left eye 3 (three) times daily.   dorzolamide 2 % ophthalmic solution Commonly known as: TRUSOPT Place 1 drop into the left eye 3 (three) times daily.   feeding supplement (GLUCERNA SHAKE) Liqd Take 237 mLs by mouth 3 (three) times daily between meals.   guaiFENesin 600 MG 12 hr tablet Commonly known as: MUCINEX Take 2 tablets (1,200 mg total) by mouth 2 (two) times daily as needed for cough or to loosen phlegm.   Insulin Pen Needle 32G X 6 MM Misc Commonly known as: NovoFine USE 5 TIMES DAILY   Lantus SoloStar 100 UNIT/ML Solostar Pen Generic drug: Insulin Glargine INJECT 150 UNITS INTO THE SKIN EVERY MORNING.   metoprolol tartrate 25 MG tablet Commonly known as: LOPRESSOR Take 0.5 tablets (12.5 mg total) by mouth 2 (two) times daily.   mirtazapine 15 MG disintegrating tablet Commonly known as: REMERON SOL-TAB Take 1 tablet (15 mg total) by mouth at bedtime.    NovoLOG FlexPen 100 UNIT/ML FlexPen Generic drug: insulin aspart INJECT 20 UNITS INTO THE SKIN 2 (TWO) TIMES DAILY WITH A MEAL. AND PEN NEEDLES 3/DAY   OneTouch Verio test strip Generic drug: glucose blood CHECK LEVELS 4 TIMES DAILY   oxycodone 5 MG capsule Commonly known as: OXY-IR Take 5 mg by mouth every 4 (four) hours as needed for pain.   Pitavastatin Calcium 1 MG Tabs Take 1 tablet (1 mg total) by mouth 2 (two) times a week.   tamsulosin 0.4 MG Caps capsule Commonly known as: FLOMAX Take 1 capsule (0.4 mg total) by mouth daily.       No orders of the defined types were placed in this encounter.   Immunization History  Administered Date(s) Administered   Influenza Split 02/06/2011, 01/28/2012   Influenza Whole 02/08/2010   Influenza, High Dose Seasonal PF 02/14/2016, 02/12/2017, 02/17/2018  Influenza,inj,Quad PF,6+ Mos 02/21/2015   Pneumococcal Conjugate-13 08/24/2014   Pneumococcal Polysaccharide-23 07/18/2008, 08/15/2015   Td 04/29/2004   Tdap 08/24/2014   Zoster 02/08/2010    Social History   Tobacco Use   Smoking status: Former Smoker    Quit date: 04/29/2000    Years since quitting: 18.4   Smokeless tobacco: Never Used   Tobacco comment: smoked for 10 years on and off  Substance Use Topics   Alcohol use: Yes    Alcohol/week: 0.0 standard drinks    Comment: 6 martinis a year    Family history is   Family History  Problem Relation Age of Onset   Hypertension Mother    Heart disease Mother        CHF did not see doctor   Diabetes Maternal Grandmother    Diabetes Son       Review of Systems  DATA OBTAINED: from patient, nurse GENERAL:  no fevers, fatigue, appetite changes; only complaint to me is a patient is anxious about COVID and his insulin regimen SKIN: No itching, or rash EYES: No eye pain, redness, discharge EARS: No earache, tinnitus, change in hearing NOSE: No congestion, drainage or bleeding  MOUTH/THROAT: No  mouth or tooth pain, No sore throat RESPIRATORY: No cough, wheezing, SOB CARDIAC: No chest pain, palpitations, lower extremity edema  GI: No abdominal pain, No N/V/D or constipation, No heartburn or reflux  GU: No dysuria, frequency or urgency, or incontinence  MUSCULOSKELETAL: No unrelieved bone/joint pain NEUROLOGIC: No headache, dizziness or focal weakness PSYCHIATRIC: Says he is anxious  Vitals:   10/20/18 0851  BP: 126/70  Pulse: 88  Resp: 18  Temp: 98.3 F (36.8 C)  SpO2: 97%    SpO2 Readings from Last 1 Encounters:  10/20/18 97%   Body mass index is 36.18 kg/m.     Physical Exam  GENERAL APPEARANCE: Alert, conversant,  No acute distress.  SKIN: No diaphoresis rash HEAD: Normocephalic, atraumatic  EYES: Conjunctiva/lids clear. Pupils round, reactive. EOMs intact.  EARS: External exam WNL, canals clear. Hearing grossly normal.  NOSE: No deformity or discharge.  MOUTH/THROAT: Lips w/o lesions  RESPIRATORY: Breathing is even, unlabored. Lung sounds are clear   CARDIOVASCULAR: Heart RRR no murmurs, rubs or gallops. No peripheral edema.   GASTROINTESTINAL: Abdomen is soft, non-tender, not distended w/ normal bowel sounds. GENITOURINARY: Bladder non tender, not distended  MUSCULOSKELETAL: No abnormal joints or musculature NEUROLOGIC:  Cranial nerves 2-12 grossly intact. 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 difficile colitis 09/25/2016   Aspiration pneumonia ([place]) 09/25/2016   History of skin cancer 08/15/2015   Undiagnosed cardiac murmurs 08/15/2015   Diabetic retinopathy ([place]) 02/21/2015   Former smoker 08/24/2014   Hyperlipidemia 08/24/2014   Ingrowing toenail 07/19/2014   Morbid obesity ([place]) 04/12/2014   Diabetic polyneuropathy ([place]) 04/11/2009   BACK PAIN, LUMBAR, WITH RADICULOPATHY  08/30/2008   DM (diabetes mellitus) type II uncontrolled with eye manifestation ([place]) 02/03/2007   Hypertension associated with diabetes ([place]) 02/03/2007      Labs reviewed: Basic Metabolic Panel:    Component Value Date/Time   NA 128 (L) 10/18/2018 0411   K 3.9 10/18/2018 0411   CL 91 (L) 10/18/2018 0411   CO2 27 10/18/2018 0411   GLUCOSE 119 (H) 10/18/2018 0411   BUN 33 (H) 10/18/2018 0411   CREATININE 1.51 (H) 10/18/2018 0411   CALCIUM 8.6 (L) 10/18/2018 0411   PROT 7.2 10/07/2018 0653   ALBUMIN 3.2 (L) 10/07/2018 0653   AST 55 (H) 10/07/2018 0653   ALT 58 (H) 10/07/2018 0653   ALKPHOS 120 10/07/2018 0653   BILITOT 1.1 10/07/2018 0653   GFRNONAA 45 (L) 10/18/2018 0411   GFRAA 52 (L) 10/18/2018 0411    Recent Labs    10/02/18 0208 10/02/18 1619  10/07/18 0653  10/16/18 0426 10/17/18 0338 10/18/18 0411  NA 136 136   < > 137   < > 125* 124* 128*  K 4.1 4.6   < > 4.5   < > 3.8 3.9 3.9  CL 106 104   < > 95*   < > 87* 88* 91*  CO2 22 20*   < > 28   < > 27 27 27   GLUCOSE 118* 188*   < > 56*   < > 173* 181* 119*  BUN 18 22   < > 42*   < > 39* 39* 33*  CREATININE 1.23 1.54*   < > 1.89*   < > 2.00* 1.77* 1.51*  CALCIUM 7.7* 7.7*   < > 8.9   < > 8.4* 8.4* 8.6*  MG 2.5* 2.5*  --  2.8*  --   --   --   --    < > = values in this interval not displayed.   Liver Function Tests: Recent Labs    12/24/17 1044 09/29/18 1140 10/07/18 0653  AST 18 32 55*  ALT 18 22  58*  ALKPHOS 48 51 120  BILITOT 1.0 1.6* 1.1  PROT 7.5 7.3 7.2  ALBUMIN 4.5 3.9 3.2*   No results for input(s): LIPASE, AMYLASE in the last 8760 hours. No results for input(s): AMMONIA in the last 8760 hours. CBC: Recent Labs    12/24/17 1044 09/29/18 1140  10/08/18 0249 10/10/18 0312 10/16/18 0426  WBC 8.8 6.0   < > 10.5 12.9* 17.7*  NEUTROABS 5.4 2.9  --   --   --   --   HGB 13.6 12.4*   < > 7.7* 9.0* 8.9*  HCT 41.3 38.0*   < > 24.3* 27.4* 26.7*  MCV 90.6 92.5   < > 92.0 88.4 86.7  PLT 287.0 33*   < > 335 394 343   < > = values in this interval not displayed.   Lipid Recent Labs    12/24/17 1044 09/29/18 1140  CHOL 218* 205*  HDL 35.80* 27*  LDLCALC 153* 139*  TRIG 144.0 195*    Cardiac Enzymes: No results for input(s): CKTOTAL, CKMB, CKMBINDEX, TROPONINI in the last 8760 hours. BNP: No results for input(s): BNP in the last 8760 hours. Lab Results  Component Value Date   MICROALBUR 41.6 (H) 08/08/2015   Lab Results  Component Value Date   HGBA1C 8.1 (H) 09/29/2018   Lab Results  Component Value Date   TSH 4.13 08/08/2015   Lab Results  Component Value Date  [id number] 306 12/24/2017   Lab Results  Component Value Date   FOLATE 11.5 05/18/2010   No results found for: IRON, TIBC, FERRITIN  Imaging and Procedures obtained prior to SNF admission: Dg Chest Port 1 View  Result Date: 09/29/2018 CLINICAL DATA:  Chest pain for 1 day. EXAM: PORTABLE CHEST 1 VIEW COMPARISON:  10/07/2017. FINDINGS: Borderline cardiac enlargement. Slight subsegmental atelectasis retrocardiac region. No consolidation or edema. No effusion or pneumothorax. Bones unremarkable. IMPRESSION: Slight subsegmental atelectasis retrocardiac region. No consolidation or edema. Electronically Signed   By: Staci Righter M.D.   On: 09/29/2018 16:58   Vas US Doppler Pre Cabg  Result Date: 09/30/2018 PREOPERATIVE VASCULAR EVALUATION  Indications: PRE CABG. Performing Technologist: June Leap  RDMS, RVT  Examination Guidelines: A complete evaluation includes B-mode imaging, spectral Doppler, color Doppler, and power Doppler as needed of all accessible portions of each vessel. Bilateral testing is considered an integral part of a complete examination. Limited examinations for reoccurring indications may be performed as noted.  Right Carotid Findings: +----------+--------+--------+--------+-----------+--------+             PSV cm/s EDV cm/s Stenosis Describe    Comments  +----------+--------+--------+--------+-----------+--------+  CCA Prox   85       14                                      +----------+--------+--------+--------+-----------+--------+  CCA Distal 71       11                                      +----------+--------+--------+--------+-----------+--------+  ICA Prox   116      40       40-59%   homogeneous           +----------+--------+--------+--------+-----------+--------+  ICA Distal 103      21                                      +----------+--------+--------+--------+-----------+--------+  ECA        88       10                                      +----------+--------+--------+--------+-----------+--------+ Portions of this table do not appear on this page. +----------+--------+-------+----------------+------------+             PSV cm/s EDV cms Describe         Arm Pressure  +----------+--------+-------+----------------+------------+  Subclavian 114              Multiphasic, WNL 143           +----------+--------+-------+----------------+------------+ +---------+--------+--+--------+--+---------+  Vertebral PSV cm/s 82 EDV cm/s 20 Antegrade  +---------+--------+--+--------+--+---------+ Left Carotid Findings: +----------+--------+--------+--------+------------+--------+             PSV cm/s EDV cm/s Stenosis Describe     Comments  +----------+--------+--------+--------+------------+--------+  CCA Prox   94       20                                        +----------+--------+--------+--------+------------+--------+  CCA Distal 77       17                                       +----------+--------+--------+--------+------------+--------+  ICA Prox   92       25       1-39%    heterogenous           +----------+--------+--------+--------+------------+--------+  ICA Distal 81       28                                       +----------+--------+--------+--------+------------+--------+  ECA        171      21                                       +----------+--------+--------+--------+------------+--------+ +----------+--------+--------+----------------+------------+  Subclavian PSV cm/s EDV cm/s Describe         Arm Pressure  +----------+--------+--------+----------------+------------+             145               Multiphasic, WNL 134           +----------+--------+--------+----------------+------------+ +---------+--------+--+--------+-+---------+  Vertebral PSV cm/s 58 EDV cm/s 8 Antegrade  +---------+--------+--+--------+-+---------+  ABI Findings: +--------+------------------+-----+---------+--------+  Right    Rt Pressure (mmHg) Index Waveform  Comment   +--------+------------------+-----+---------+--------+  Brachial 143                      triphasic           +--------+------------------+-----+---------+--------+  ATA      168                1.17  biphasic            +--------+------------------+-----+---------+--------+  PTA      167                1.17  biphasic            +--------+------------------+-----+---------+--------+ +--------+------------------+-----+--------+-------+  Left     Lt Pressure (mmHg) Index Waveform Comment  +--------+------------------+-----+--------+-------+  Brachial 134                      biphasic          +--------+------------------+-----+--------+-------+  ATA      143                1.00  biphasic          +--------+------------------+-----+--------+-------+  PTA      151                1.06  biphasic           +--------+------------------+-----+--------+-------+  Right Doppler Findings: +--------+--------+-----+---------+--------+  Site     Pressure Index Doppler   Comments  +--------+--------+-----+---------+--------+  Brachial 143            triphasic           +--------+--------+-----+---------+--------+  Radial                  triphasic           +--------+--------+-----+---------+--------+  Ulnar  triphasic           +--------+--------+-----+---------+--------+  Left Doppler Findings: +--------+--------+-----+---------+--------+  Site     Pressure Index Doppler   Comments  +--------+--------+-----+---------+--------+  Brachial 134            biphasic            +--------+--------+-----+---------+--------+  Radial                  triphasic           +--------+--------+-----+---------+--------+  Ulnar                   triphasic           +--------+--------+-----+---------+--------+  Summary: Right Carotid: Velocities in the right ICA are consistent with a 40-59%                stenosis. Left Carotid: Velocities in the left ICA are consistent with a 1-39% stenosis. Right ABI: Resting right ankle-brachial index is within normal range. No evidence of significant right lower extremity arterial disease. Left ABI: Resting left ankle-brachial index is within normal range. No evidence of significant left lower extremity arterial disease. Right Upper Extremity: Doppler waveforms decrease >50% with right radial compression. Doppler waveforms remain within normal limits with right ulnar compression. Left Upper Extremity: Doppler waveforms decrease >50% with left radial compression. Doppler waveforms remain within normal limits with left ulnar compression.  Electronically signed by Curt Jews MD on 09/30/2018 at 2:37:59 PM.    Final      Not all labs, radiology exams or other studies done during hospitalization come through on my EPIC note; however they are reviewed by me.    Assessment and Plan  STEMI-  underwent cardiac cath which showed three-vessel disease; evaluated by CT surgery and underwent CABG on 6/4 SNF- admitted for OT/PT; continue ASA 325 mg daily  Postop atrial fib- chemically cardioverted to sinus rhythm and placed on amiodarone and beta-blocker SNF- continue amiodarone 200 mg daily and metoprolol 1.5 mg twice daily  Acute renal failure, postop- requiring renal dose dopamine which was weaned over time, responded to diuretics and creatinine at discharge 1.5 SNF- we will follow-up BMP  Acute blood loss anemia, postop- requiring transfusion; discharge hemoglobin 8.9 SNF- follow-up CBC;  Diabetes mellitus 2- brittle; was reported the patient's sugars were difficult to control SNF- per patient he was put into insulin shot by being given too much insulin; he is now on Lantus 150 units in the morning and 20 units of regular twice daily; he absolutely refuses to take any long acting insulin; I did get him to agree to take 20 units of regular with every meal and we will watch his sugars for a week and then start working on long acting insulin and he was agreeable to this approach  Hypertension SNF-controlled on Norvasc 5 mg daily and metoprolol 12.5 mg twice daily  BPH SNF-not stated as obstructive; continue Flomax 0.4 mg daily  Depression SNF- recently well controlled: Continue Remeron 15 mg daily   Time spent greater than 45 minutes;> 50% of time with patient was spent reviewing records, labs, tests and studies, counseling and developing plan of care  Hennie Duos, MD

## 2018-10-22 ENCOUNTER — Other Ambulatory Visit: Payer: Self-pay | Admitting: Internal Medicine

## 2018-10-22 MED ORDER — ALPRAZOLAM 0.5 MG PO TABS: 0.5000 mg | ORAL_TABLET | Freq: Two times a day (BID) | ORAL | 0 refills | Status: DC

## 2018-10-23 ENCOUNTER — Encounter: Payer: Self-pay | Admitting: Internal Medicine

## 2018-10-23 ENCOUNTER — Non-Acute Institutional Stay (SKILLED_NURSING_FACILITY): Payer: PPO | Admitting: Internal Medicine

## 2018-10-23 DIAGNOSIS — I25110 Atherosclerotic heart disease of native coronary artery with unstable angina pectoris: Secondary | ICD-10-CM

## 2018-10-23 DIAGNOSIS — Z951 Presence of aortocoronary bypass graft: Secondary | ICD-10-CM | POA: Diagnosis not present

## 2018-10-23 DIAGNOSIS — I2111 ST elevation (STEMI) myocardial infarction involving right coronary artery: Secondary | ICD-10-CM

## 2018-10-24 DIAGNOSIS — Z8659 Personal history of other mental and behavioral disorders: Secondary | ICD-10-CM | POA: Insufficient documentation

## 2018-10-24 DIAGNOSIS — Z8679 Personal history of other diseases of the circulatory system: Secondary | ICD-10-CM | POA: Insufficient documentation

## 2018-10-24 DIAGNOSIS — I4891 Unspecified atrial fibrillation: Secondary | ICD-10-CM | POA: Insufficient documentation

## 2018-10-24 DIAGNOSIS — N400 Enlarged prostate without lower urinary tract symptoms: Secondary | ICD-10-CM | POA: Insufficient documentation

## 2018-10-24 DIAGNOSIS — I9789 Other postprocedural complications and disorders of the circulatory system, not elsewhere classified: Secondary | ICD-10-CM | POA: Insufficient documentation

## 2018-10-24 DIAGNOSIS — D62 Acute posthemorrhagic anemia: Secondary | ICD-10-CM | POA: Insufficient documentation

## 2018-10-25 ENCOUNTER — Encounter: Payer: Self-pay | Admitting: Internal Medicine

## 2018-10-25 NOTE — Progress Notes (Signed)
Location:   [facility] Service:   SNF Provider: Hennie Duos MD  Marin Olp, MD  Patient Care Team: Marin Olp, MD as PCP - General (Family Medicine)  Extended Emergency Contact Information Primary Emergency Contact: Snell,Ruth S Address: [address]          [place] Phone: [phone] Work Phone: [phone] Relation: Spouse    Allergies: Simvastatin  Chief Complaint  Patient presents with   Acute Visit    HPI: Patient is 73 y.o. male who [employer] work and I are having a family conference with his wife regarding his care and progress.  Patient is not in attendance at wife's request.  Past Medical History:  Diagnosis Date   Diabetes mellitus    Hypertension    Tobacco abuse     Past Surgical History:  Procedure Laterality Date   CORONARY ARTERY BYPASS GRAFT N/A 10/01/2018   Procedure: CORONARY ARTERY BYPASS GRAFTING (CABG) TIMES  FOUR USING LEFT MAMMARY ARTERY AND RIGHT GREATER SAPHEANOUS VEIN HARVESTED ENDOSCOPICALLY;  Surgeon: Melrose Nakayama, MD;  Location: [facility];  Service: Open Heart Surgery;  Laterality: N/A;   HERNIA REPAIR     >10 years ago   IR RADIOLOGIST EVAL & MGMT  12/24/2016   IR THORACENTESIS ASP PLEURAL SPACE W/IMG GUIDE  10/06/2018   LEFT HEART CATH AND CORONARY ANGIOGRAPHY N/A 09/29/2018   Procedure: LEFT HEART CATH AND CORONARY ANGIOGRAPHY;  Surgeon: Troy Sine, MD;  Location: [facility];  Service: Cardiovascular;  Laterality: N/A;   right shoulder surgery     around 2014   TEE WITHOUT CARDIOVERSION N/A 10/01/2018   Procedure: TRANSESOPHAGEAL ECHOCARDIOGRAM (TEE);  Surgeon: Melrose Nakayama, MD;  Location: [facility];  Service: Open Heart Surgery;  Laterality: N/A;    Allergies as of 10/23/2018      Reactions   Simvastatin Diarrhea      Medication List       Accurate as of October 23, 2018 11:59 PM. If you have any questions, ask your nurse or  doctor.        ALPRAZolam 0.5 MG tablet Commonly known as: Xanax Take 1 tablet (0.5 mg total) by mouth 2 (two) times daily.   amiodarone 200 MG tablet Commonly known as: PACERONE Take 1 tablet (200 mg total) by mouth daily.   amLODipine 5 MG tablet Commonly known as: NORVASC Take 1 tablet (5 mg total) by mouth daily.   aspirin 325 MG EC tablet Take 1 tablet (325 mg total) by mouth daily.   Besivance 0.6 % Susp Generic drug: Besifloxacin HCl Place 1 drop into the left eye 3 (three) times daily.   brimonidine 0.2 % ophthalmic solution Commonly known as: ALPHAGAN Place 1 drop into the left eye 3 (three) times daily.   dorzolamide 2 % ophthalmic solution Commonly known as: TRUSOPT Place 1 drop into the left eye 3 (three) times daily.   feeding supplement (GLUCERNA SHAKE) Liqd Take 237 mLs by mouth 3 (three) times daily between meals.   guaiFENesin 600 MG 12 hr tablet Commonly known as: MUCINEX Take 2 tablets (1,200 mg total) by mouth 2 (two) times daily as needed for cough or to loosen phlegm.   Insulin Pen Needle 32G X 6 MM Misc Commonly known as: NovoFine USE 5 TIMES DAILY   Lantus SoloStar 100 UNIT/ML Solostar Pen Generic drug: Insulin Glargine INJECT 150 UNITS INTO THE SKIN EVERY MORNING.  metoprolol tartrate 25 MG tablet Commonly known as: LOPRESSOR Take 0.5 tablets (12.5 mg total) by mouth 2 (two) times daily.   mirtazapine 15 MG disintegrating tablet Commonly known as: REMERON SOL-TAB Take 1 tablet (15 mg total) by mouth at bedtime.   NovoLOG FlexPen 100 UNIT/ML FlexPen Generic drug: insulin aspart INJECT 20 UNITS INTO THE SKIN 2 (TWO) TIMES DAILY WITH A MEAL. AND PEN NEEDLES 3/DAY   OneTouch Verio test strip Generic drug: glucose blood CHECK LEVELS 4 TIMES DAILY   oxyCODONE 5 MG immediate release tablet Commonly known as: Oxy IR/ROXICODONE Take 1 tablet (5 mg total) by mouth every 4 (four) hours as needed for severe pain.   Pitavastatin Calcium 1  MG Tabs Take 1 tablet (1 mg total) by mouth 2 (two) times a week.   tamsulosin 0.4 MG Caps capsule Commonly known as: FLOMAX Take 1 capsule (0.4 mg total) by mouth daily.       No orders of the defined types were placed in this encounter.   Immunization History  Administered Date(s) Administered   Influenza Split 02/06/2011, 01/28/2012   Influenza Whole 02/08/2010   Influenza, High Dose Seasonal PF 02/14/2016, 02/12/2017, 02/17/2018   Influenza,inj,Quad PF,6+ Mos 02/21/2015   Pneumococcal Conjugate-13 08/24/2014   Pneumococcal Polysaccharide-23 07/18/2008, 08/15/2015   Td 04/29/2004   Tdap 08/24/2014   Zoster 02/08/2010    Social History   Tobacco Use   Smoking status: Former Smoker    Quit date: 04/29/2000    Years since quitting: 18.5   Smokeless tobacco: Never Used   Tobacco comment: smoked for 10 years on and off  Substance Use Topics   Alcohol use: Yes    Alcohol/week: 0.0 standard drinks    Comment: 6 martinis a year    Review of Systems  DATA OBTAINED: from patient, nurse GENERAL:  no fevers, fatigue, appetite changes SKIN: No itching, rash HEENT: No complaint RESPIRATORY: No cough, wheezing, SOB CARDIAC: No chest pain, palpitations, lower extremity edema  GI: No abdominal pain, No N/V/D or constipation, No heartburn or reflux  GU: No dysuria, frequency or urgency, or incontinence  MUSCULOSKELETAL: No unrelieved bone/joint pain NEUROLOGIC: No headache, dizziness  PSYCHIATRIC: + anxiety, no sadness  Vitals:   10/25/18 1742  BP: 134/84  Pulse: 88  Resp: 18  Temp: 98 F (36.7 C)   There is no height or weight on file to calculate BMI. Physical Exam  GENERAL APPEARANCE: Alert, conversant, No acute distress  SKIN: Midline chest incision healing HEENT: Unremarkable RESPIRATORY: Breathing is even, unlabored. Lung sounds are clear   CARDIOVASCULAR: Heart RRR no murmurs, rubs or gallops. No peripheral edema  GASTROINTESTINAL: Abdomen is  soft, non-tender, not distended w/ normal bowel sounds.  GENITOURINARY: Bladder non tender, not distended  MUSCULOSKELETAL: No abnormal joints or musculature NEUROLOGIC: Cranial nerves 2-12 grossly intact. 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 difficile colitis 09/25/2016   Aspiration pneumonia ([place]) 09/25/2016   Acute kidney injury ([place]) 09/18/2016   History of skin cancer 08/15/2015   Undiagnosed cardiac murmurs 08/15/2015   Diabetic retinopathy ([place]) 02/21/2015   Former smoker 08/24/2014   Hyperlipidemia 08/24/2014   Ingrowing toenail 07/19/2014   Morbid obesity ([place]) 04/12/2014   Diabetic polyneuropathy ([place]) 04/11/2009   BACK PAIN, LUMBAR, WITH RADICULOPATHY 08/30/2008   DM (diabetes mellitus) type II uncontrolled with eye manifestation ([place]) 02/03/2007   Hypertension associated with diabetes ([place]) 02/03/2007    CMP     Component Value Date/Time   NA 128 (L) 10/18/2018 0411   K 3.9 10/18/2018 0411   CL 91 (L) 10/18/2018 0411   CO2 27 10/18/2018 0411   GLUCOSE 119 (H) 10/18/2018 0411    BUN 33 (H) 10/18/2018 0411   CREATININE 1.51 (H) 10/18/2018 0411   CALCIUM 8.6 (L) 10/18/2018 0411   PROT 7.2 10/07/2018 0653   ALBUMIN 3.2 (L) 10/07/2018 0653   AST 55 (H) 10/07/2018 0653   ALT 58 (H) 10/07/2018 0653   ALKPHOS 120 10/07/2018 0653   BILITOT 1.1 10/07/2018 0653   GFRNONAA 45 (L) 10/18/2018 0411   GFRAA 52 (L) 10/18/2018 0411   Recent Labs    10/02/18 0208 10/02/18 1619  10/07/18 0653  10/16/18 0426 10/17/18 0338 10/18/18 0411  NA 136 136   < > 137   < > 125* 124* 128*  K 4.1 4.6   < > 4.5   < > 3.8 3.9 3.9  CL 106 104   < > 95*   < > 87* 88* 91*  CO2 22 20*   < > 28   < > 27 27 27   GLUCOSE 118* 188*   < > 56*   < > 173* 181* 119*  BUN 18 22   < > 42*   < > 39* 39* 33*  CREATININE 1.23 1.54*   < > 1.89*   < > 2.00* 1.77* 1.51*  CALCIUM 7.7* 7.7*   < > 8.9   < > 8.4* 8.4* 8.6*  MG 2.5* 2.5*  --  2.8*  --   --   --   --    < > = values in this interval not displayed.   Recent Labs    12/24/17 1044 09/29/18 1140 10/07/18 0653  AST 18 32 55*  ALT 18 22 58*  ALKPHOS 48 51 120  BILITOT 1.0 1.6* 1.1  PROT 7.5 7.3 7.2  ALBUMIN 4.5 3.9 3.2*   Recent Labs    12/24/17 1044 09/29/18 1140  10/08/18 0249 10/10/18 0312 10/16/18 0426  WBC 8.8 6.0   < > 10.5 12.9* 17.7*  NEUTROABS 5.4 2.9  --   --   --   --   HGB 13.6 12.4*   < > 7.7* 9.0* 8.9*  HCT 41.3 38.0*   < > 24.3* 27.4* 26.7*  MCV 90.6 92.5   < > 92.0 88.4 86.7  PLT 287.0 33*   < > 335 394 343   < > = values in this interval not displayed.   Recent Labs    12/24/17 1044 09/29/18 1140  CHOL 218* 205*  LDLCALC 153* 139*  TRIG 144.0 195*   Lab Results  Component Value Date   MICROALBUR 41.6 (H) 08/08/2015  Lab Results  Component Value Date   TSH 4.13 08/08/2015   Lab Results  Component Value Date   HGBA1C 8.1 (H) 09/29/2018   Lab Results  Component Value Date   CHOL 205 (H) 09/29/2018   HDL 27 (L) 09/29/2018   LDLCALC 139 (H) 09/29/2018   LDLDIRECT 173.0 07/01/2018   TRIG 195 (H)  09/29/2018   CHOLHDL 7.6 09/29/2018    Significant Diagnostic Results in last 30 days:  Dg Chest 1 View  Result Date: 10/06/2018 CLINICAL DATA:  Status post left thoracentesis. EXAM: CHEST  1 VIEW COMPARISON:  Chest x-ray from yesterday. FINDINGS: Stable cardiomediastinal silhouette status post CABG. Persistent low lung volumes with small bilateral pleural effusions and bibasilar atelectasis. No pneumothorax. No acute osseous abnormality. IMPRESSION: 1. Unchanged small bilateral pleural effusions and bibasilar atelectasis. No pneumothorax. Electronically Signed   By: Titus Dubin M.D.   On: 10/06/2018 14:51   Dg Chest 2 View  Result Date: 10/10/2018 CLINICAL DATA:  Atelectasis. EXAM: CHEST - 2 VIEW COMPARISON:  10/08/2018 FINDINGS: Sequelae of CABG are again identified. Lung volumes remain low with similar appearance of bibasilar lung opacities and small pleural effusions. No overt pulmonary edema or pneumothorax is identified. IMPRESSION: Low lung volumes with unchanged bibasilar atelectasis and small pleural effusions. Electronically Signed   By: Logan Bores M.D.   On: 10/10/2018 11:05   Dg Chest 2 View  Result Date: 10/08/2018 CLINICAL DATA:  Shortness of breath EXAM: CHEST - 2 VIEW COMPARISON:  10/07/2018 FINDINGS: Unchanged low volume examination with minimal interstitial pulmonary opacity consistent with edema and layering bilateral pleural effusions. No new or focal airspace opacity. Cardiomegaly status post median sternotomy and CABG. IMPRESSION: Unchanged low volume examination with minimal interstitial pulmonary opacity consistent with edema and layering bilateral pleural effusions. No new or focal airspace opacity. Cardiomegaly status post median sternotomy and CABG. Electronically Signed   By: Eddie Candle M.D.   On: 10/08/2018 08:28   Dg Chest Port 1 View  Result Date: 10/07/2018 CLINICAL DATA:  History of coronary bypass grafting and recent left thoracentesis. EXAM: PORTABLE CHEST  1 VIEW COMPARISON:  10/06/2018 FINDINGS: Cardiac shadow is enlarged. Postsurgical changes are again noted. Bilateral pleural effusions are noted right greater than left. No pneumothorax is seen. Mild central vascular congestion is noted as well. IMPRESSION: Changes of CHF with bilateral effusions. Electronically Signed   By: Inez Catalina M.D.   On: 10/07/2018 08:00   Dg Chest Port 1 View  Result Date: 10/06/2018 CLINICAL DATA:  Sore chest post cardiac surgery, diabetes mellitus, hypertension EXAM: PORTABLE CHEST 1 VIEW COMPARISON:  Portable exam 0510 hours compared to 10/05/2018 FINDINGS: Enlargement of cardiac silhouette post CABG. Mediastinal contours prominent but stable, presumed post cardiac surgery. Bibasilar atelectasis and pleural effusions RIGHT greater than LEFT, little changed. Accentuated perihilar markings stable. No pneumothorax or acute osseous findings. IMPRESSION: Bibasilar effusions and atelectasis RIGHT greater than LEFT. Enlargement of cardiac silhouette post CABG. Electronically Signed   By: Lavonia Dana M.D.   On: 10/06/2018 08:02   Dg Chest Port 1 View  Result Date: 10/05/2018 CLINICAL DATA:  Status post coronary bypass grafting EXAM: PORTABLE CHEST 1 VIEW COMPARISON:  10/04/2018 FINDINGS: Cardiac shadow is prominent but stable. Postsurgical changes are noted. Left jugular central venous sheath is noted and stable. Small bilateral pleural effusions right greater than left are seen. Underlying atelectatic changes are present. No pneumothorax is seen. IMPRESSION: Small bilateral pleural effusions with basilar atelectasis right greater than left. Electronically Signed  By: Inez Catalina M.D.   On: 10/05/2018 07:38   Dg Chest Port 1 View  Result Date: 10/04/2018 CLINICAL DATA:  Status post CABG procedure. EXAM: PORTABLE CHEST 1 VIEW COMPARISON:  Chest radiograph 10/03/2018 FINDINGS: Monitoring leads overlie the patient. Stable cardiomegaly status post median sternotomy. Low lung volumes.  Bilateral interstitial pulmonary opacities. Small bilateral pleural effusions with underlying heterogeneous opacities. Left IJ sheath stable in position. IMPRESSION: Low lung volumes, cardiomegaly, interstitial edema, small bilateral effusions and atelectasis. Electronically Signed   By: Lovey Newcomer M.D.   On: 10/04/2018 08:14   Dg Chest Port 1 View  Result Date: 10/03/2018 CLINICAL DATA:  Status post CABG procedure. EXAM: PORTABLE CHEST 1 VIEW COMPARISON:  Chest radiograph 10/02/2018 FINDINGS: Interval retraction PA catheter. Left IJ sheath stable in position. Patient status post median sternotomy. Monitoring leads overlie the patient. Stable cardiac and mediastinal contours. Interval removal mediastinal drain and left chest tube. Low lung volumes. Bibasilar opacities. No definite pneumothorax. Trace bilateral pleural effusions. IMPRESSION: Interval retraction PA catheter, removal mediastinal drain and left chest tube. Low lung volumes with basilar opacities favored represent atelectasis. Probable trace bilateral pleural effusions. Electronically Signed   By: Lovey Newcomer M.D.   On: 10/03/2018 06:48   Dg Chest Port 1 View  Result Date: 10/02/2018 CLINICAL DATA:  Status post coronary bypass graft. EXAM: PORTABLE CHEST 1 VIEW COMPARISON:  Radiograph October 01, 2018. FINDINGS: Stable cardiomegaly. Status post coronary bypass graft. Endotracheal and nasogastric tubes have been removed. Left internal jugular catheter is noted with tip directed toward right pulmonary artery. Left-sided chest tube is noted without pneumothorax. Stable bibasilar atelectasis is noted with probable minimal pleural effusions. Bony thorax is unremarkable. IMPRESSION: Endotracheal and nasogastric tubes have been removed. Stable position of left-sided chest tube without pneumothorax. Stable bibasilar atelectasis with probable minimal pleural effusions. Electronically Signed   By: Marijo Conception M.D.   On: 10/02/2018 08:33   Dg Chest Port 1  View  Result Date: 10/01/2018 CLINICAL DATA:  Prior CABG. EXAM: PORTABLE CHEST 1 VIEW COMPARISON:  09/29/2018. FINDINGS: Endotracheal tube tip 2.5 cm above the carina. Mediastinal drainage catheter noted over the mid chest. NG tube noted with tip below left hemidiaphragm. Swan-Ganz catheter with tip over right main pulmonary artery. Left chest tube noted with tip over the left lung base. Prior CABG. Cardiomegaly. Bibasilar atelectasis/infiltrates and bilateral pleural effusions. IMPRESSION: 1.  Lines and tubes in good anatomic position as above. 2.  Prior CABG.  Cardiomegaly.  No pulmonary venous congestion. 3. Bibasilar atelectasis/infiltrates and bilateral pleural effusions. Electronically Signed   By: Marcello Moores  Register   On: 10/01/2018 15:09   Dg Chest Port 1 View  Result Date: 09/29/2018 CLINICAL DATA:  Chest pain for 1 day. EXAM: PORTABLE CHEST 1 VIEW COMPARISON:  10/07/2017. FINDINGS: Borderline cardiac enlargement. Slight subsegmental atelectasis retrocardiac region. No consolidation or edema. No effusion or pneumothorax. Bones unremarkable. IMPRESSION: Slight subsegmental atelectasis retrocardiac region. No consolidation or edema. Electronically Signed   By: Staci Righter M.D.   On: 09/29/2018 16:58   Vas US Doppler Pre Cabg  Result Date: 09/30/2018 PREOPERATIVE VASCULAR EVALUATION  Indications: PRE CABG. Performing Technologist: June Leap RDMS, RVT  Examination Guidelines: A complete evaluation includes B-mode imaging, spectral Doppler, color Doppler, and power Doppler as needed of all accessible portions of each vessel. Bilateral testing is considered an integral part of a complete examination. Limited examinations for reoccurring indications may be performed as noted.  Right Carotid Findings: +----------+--------+--------+--------+-----------+--------+  PSV cm/s EDV cm/s Stenosis Describe    Comments  +----------+--------+--------+--------+-----------+--------+  CCA Prox   85       14                                       +----------+--------+--------+--------+-----------+--------+  CCA Distal 71       11                                      +----------+--------+--------+--------+-----------+--------+  ICA Prox   116      40       40-59%   homogeneous           +----------+--------+--------+--------+-----------+--------+  ICA Distal 103      21                                      +----------+--------+--------+--------+-----------+--------+  ECA        88       10                                      +----------+--------+--------+--------+-----------+--------+ Portions of this table do not appear on this page. +----------+--------+-------+----------------+------------+             PSV cm/s EDV cms Describe         Arm Pressure  +----------+--------+-------+----------------+------------+  Subclavian 114              Multiphasic, WNL 143           +----------+--------+-------+----------------+------------+ +---------+--------+--+--------+--+---------+  Vertebral PSV cm/s 82 EDV cm/s 20 Antegrade  +---------+--------+--+--------+--+---------+ Left Carotid Findings: +----------+--------+--------+--------+------------+--------+             PSV cm/s EDV cm/s Stenosis Describe     Comments  +----------+--------+--------+--------+------------+--------+  CCA Prox   94       20                                       +----------+--------+--------+--------+------------+--------+  CCA Distal 77       17                                       +----------+--------+--------+--------+------------+--------+  ICA Prox   92       25       1-39%    heterogenous           +----------+--------+--------+--------+------------+--------+  ICA Distal 81       28                                       +----------+--------+--------+--------+------------+--------+  ECA        171      21                                       +----------+--------+--------+--------+------------+--------+  +----------+--------+--------+----------------+------------+  Subclavian PSV cm/s EDV cm/s Describe         Arm Pressure  +----------+--------+--------+----------------+------------+             145               Multiphasic, WNL 134           +----------+--------+--------+----------------+------------+ +---------+--------+--+--------+-+---------+  Vertebral PSV cm/s 58 EDV cm/s 8 Antegrade  +---------+--------+--+--------+-+---------+  ABI Findings: +--------+------------------+-----+---------+--------+  Right    Rt Pressure (mmHg) Index Waveform  Comment   +--------+------------------+-----+---------+--------+  Brachial 143                      triphasic           +--------+------------------+-----+---------+--------+  ATA      168                1.17  biphasic            +--------+------------------+-----+---------+--------+  PTA      167                1.17  biphasic            +--------+------------------+-----+---------+--------+ +--------+------------------+-----+--------+-------+  Left     Lt Pressure (mmHg) Index Waveform Comment  +--------+------------------+-----+--------+-------+  Brachial 134                      biphasic          +--------+------------------+-----+--------+-------+  ATA      143                1.00  biphasic          +--------+------------------+-----+--------+-------+  PTA      151                1.06  biphasic          +--------+------------------+-----+--------+-------+  Right Doppler Findings: +--------+--------+-----+---------+--------+  Site     Pressure Index Doppler   Comments  +--------+--------+-----+---------+--------+  Brachial 143            triphasic           +--------+--------+-----+---------+--------+  Radial                  triphasic           +--------+--------+-----+---------+--------+  Ulnar                   triphasic           +--------+--------+-----+---------+--------+  Left Doppler Findings: +--------+--------+-----+---------+--------+  Site      Pressure Index Doppler   Comments  +--------+--------+-----+---------+--------+  Brachial 134            biphasic            +--------+--------+-----+---------+--------+  Radial                  triphasic           +--------+--------+-----+---------+--------+  Ulnar                   triphasic           +--------+--------+-----+---------+--------+  Summary: Right Carotid: Velocities in the right ICA are consistent with a 40-59%                stenosis. Left Carotid: Velocities in the left ICA are consistent with a 1-39% stenosis. Right ABI: Resting right ankle-brachial index is within normal range. No evidence of significant right  lower extremity arterial disease. Left ABI: Resting left ankle-brachial index is within normal range. No evidence of significant left lower extremity arterial disease. Right Upper Extremity: Doppler waveforms decrease >50% with right radial compression. Doppler waveforms remain within normal limits with right ulnar compression. Left Upper Extremity: Doppler waveforms decrease >50% with left radial compression. Doppler waveforms remain within normal limits with left ulnar compression.  Electronically signed by Curt Jews MD on 09/30/2018 at 2:37:59 PM.    Final    Ir Thoracentesis Asp Pleural Space W/img Guide  Result Date: 10/06/2018 INDICATION: Patient with history of MI s/p CABG x3 10/02/2018 by Dr. Koleen Nimrod, dyspnea, and bilateral pleural effusions. Request is made for therapeutic left thoracentesis. EXAM: ULTRASOUND GUIDED THERAPEUTIC LEFT THORACENTESIS MEDICATIONS: 10 mL 1% lidocaine COMPLICATIONS: None immediate. PROCEDURE: An ultrasound guided thoracentesis was thoroughly discussed with the patient and questions answered. The benefits, risks, alternatives and complications were also discussed. The patient understands and wishes to proceed with the procedure. Written consent was obtained. Ultrasound was performed to localize and mark an adequate pocket of fluid in the left chest.  The area was then prepped and draped in the normal sterile fashion. 1% Lidocaine was used for local anesthesia. Under ultrasound guidance a 6 Fr Safe-T-Centesis catheter was introduced. Thoracentesis was performed. The catheter was removed and a dressing applied. FINDINGS: A total of approximately 600 mL of dark red fluid was removed. IMPRESSION: Successful ultrasound guided left thoracentesis yielding 600 mL of pleural fluid. Read by: Earley Abide, PA-C Electronically Signed   By: [name].  Shick M.D.   On: 10/06/2018 14:55    Assessment and Plan  STEMI /CAD/CABG/encounter for family conference without patient present- patient's wife had multiple questions about the coming home process and how much patient would be able to do; Alyse Low was able answer 99% of those questions; if patient was fully active before his surgery I told his wife that he should be expected to be able to get back to that level, if not better since he now has clean coronaries.  I described to her at length the measures the nursing home is taking to ensure that COVID does not enter the building, or if it does is edges did it was 1 patient in isolation that no other patient came in contact with and that everyone who came in contact with with full PPE.  Patient is wife understands this and appreciates what we are doing she does have concerned about his anxiety- now told her we just started him on Xanax twice a day- and we assured him that he would get plenty of encouragement and would be able to work with steps at some point.  All questions asked and answered the meeting was adjourned.    Time spent greater than 35 minutes;> 50% of time with patient was spent reviewing records, labs, tests and studies, counseling and developing plan of care  Hennie Duos, MD

## 2018-10-28 ENCOUNTER — Telehealth: Payer: Self-pay

## 2018-10-28 ENCOUNTER — Non-Acute Institutional Stay (SKILLED_NURSING_FACILITY): Payer: PPO | Admitting: Internal Medicine

## 2018-10-28 ENCOUNTER — Encounter: Payer: Self-pay | Admitting: Internal Medicine

## 2018-10-28 DIAGNOSIS — E1159 Type 2 diabetes mellitus with other circulatory complications: Secondary | ICD-10-CM

## 2018-10-28 DIAGNOSIS — I4891 Unspecified atrial fibrillation: Secondary | ICD-10-CM | POA: Diagnosis not present

## 2018-10-28 DIAGNOSIS — Z794 Long term (current) use of insulin: Secondary | ICD-10-CM | POA: Diagnosis not present

## 2018-10-28 DIAGNOSIS — I2111 ST elevation (STEMI) myocardial infarction involving right coronary artery: Secondary | ICD-10-CM | POA: Diagnosis not present

## 2018-10-28 DIAGNOSIS — E118 Type 2 diabetes mellitus with unspecified complications: Secondary | ICD-10-CM | POA: Diagnosis not present

## 2018-10-28 DIAGNOSIS — N400 Enlarged prostate without lower urinary tract symptoms: Secondary | ICD-10-CM

## 2018-10-28 DIAGNOSIS — N179 Acute kidney failure, unspecified: Secondary | ICD-10-CM

## 2018-10-28 DIAGNOSIS — I152 Hypertension secondary to endocrine disorders: Secondary | ICD-10-CM

## 2018-10-28 DIAGNOSIS — I10 Essential (primary) hypertension: Secondary | ICD-10-CM

## 2018-10-28 DIAGNOSIS — F339 Major depressive disorder, recurrent, unspecified: Secondary | ICD-10-CM

## 2018-10-28 DIAGNOSIS — I9789 Other postprocedural complications and disorders of the circulatory system, not elsewhere classified: Secondary | ICD-10-CM

## 2018-10-28 DIAGNOSIS — D62 Acute posthemorrhagic anemia: Secondary | ICD-10-CM

## 2018-10-28 MED ORDER — DORZOLAMIDE HCL 2 % OP SOLN: 1.0000 [drp] | Freq: Three times a day (TID) | OPHTHALMIC | 0 refills | Status: DC

## 2018-10-28 MED ORDER — METOPROLOL TARTRATE 25 MG PO TABS: 12.5000 mg | ORAL_TABLET | Freq: Two times a day (BID) | ORAL | 0 refills | Status: DC

## 2018-10-28 MED ORDER — AMIODARONE HCL 200 MG PO TABS: 200.0000 mg | ORAL_TABLET | Freq: Every day | ORAL | 0 refills | Status: DC

## 2018-10-28 MED ORDER — OXYCODONE HCL 5 MG PO TABS: 5.0000 mg | ORAL_TABLET | ORAL | 0 refills | Status: DC | PRN

## 2018-10-28 MED ORDER — AMLODIPINE BESYLATE 5 MG PO TABS: 5.0000 mg | ORAL_TABLET | Freq: Every day | ORAL | 0 refills | Status: DC

## 2018-10-28 MED ORDER — PITAVASTATIN CALCIUM 1 MG PO TABS: 1.0000 mg | ORAL_TABLET | ORAL | 0 refills | Status: DC

## 2018-10-28 MED ORDER — NOVOLOG FLEXPEN 100 UNIT/ML ~~LOC~~ SOPN: 20.0000 [IU] | PEN_INJECTOR | Freq: Three times a day (TID) | SUBCUTANEOUS | 0 refills | Status: DC

## 2018-10-28 MED ORDER — ALPRAZOLAM 0.5 MG PO TABS: 0.5000 mg | ORAL_TABLET | Freq: Two times a day (BID) | ORAL | 0 refills | Status: DC

## 2018-10-28 MED ORDER — TAMSULOSIN HCL 0.4 MG PO CAPS: 0.4000 mg | ORAL_CAPSULE | Freq: Every day | ORAL | 0 refills | Status: DC

## 2018-10-28 MED ORDER — GUAIFENESIN ER 600 MG PO TB12: 1200.0000 mg | ORAL_TABLET | Freq: Two times a day (BID) | ORAL | 0 refills | Status: DC | PRN

## 2018-10-28 MED ORDER — BESIVANCE 0.6 % OP SUSP: 1.0000 [drp] | Freq: Three times a day (TID) | OPHTHALMIC | 0 refills | Status: DC

## 2018-10-28 MED ORDER — MIRTAZAPINE 15 MG PO TBDP: 15.0000 mg | ORAL_TABLET | Freq: Every day | ORAL | 0 refills | Status: DC

## 2018-10-28 MED ORDER — BRIMONIDINE TARTRATE 0.2 % OP SOLN: 1.0000 [drp] | Freq: Three times a day (TID) | OPHTHALMIC | 0 refills | Status: DC

## 2018-10-28 NOTE — Telephone Encounter (Signed)
Patient's wife, [name] contacted the office with several questions about her husbands care.  She stated that he is leaving rehab tomorrow and wanted to know what diet and restrictions he was to have once home.  I advised her to keep him on a heart healthy diet and one low in sodium.  No lifting, pushing, or pulling until otherwise instructed.  Walk daily and keep incisions clean and dry, may shower.  And to maintain medications.  She acknowledged receipt.  She also asked if he could go to [place] where family has land and stay over night.  I advised against this as he should refrain, for now, riding for long distances.  I advised that she could revisit this questions at her follow-up appointment.  She acknowledged receipt.  She is aware of his Cardiology and [facility]CTS appointments.

## 2018-10-28 NOTE — Progress Notes (Signed)
Location:  [place] of Service:  SNF (31)SNF Provider: Hennie Duos MD  PCP: Marin Olp, MD Patient Care Team: Marin Olp, MD as PCP - General (Family Medicine)  Extended Emergency Contact Information Primary Emergency Contact: Wold,Ruth S Address: [address]          [address] [place], [place] Phone: [phone] Work Phone: [phone] Mobile Phone: [phone] Relation: Spouse  Allergies  Allergen Reactions   Simvastatin Diarrhea    Chief Complaint  Patient presents with   Discharge Note    HPI:  73 y.o. male with severe diabetes mellitus type 2, hypertension who presented to [facility] emergency department on 6 2 with a STEMI.  Patient had been traveling to [place], [employer] earlier in the day but when he developed sudden onset of chest tightness with radiation to left arm he turned around and came back to [place] and drove to his home, call for EMS who transported him to the emergency department.  Patient denied any shortness of breath lower extremity edema orthopnea, PND, dizziness or syncope.  Noted that for approximately 10 to 15 years he had occasional chest tightness that he associated with stressful situations which resolved on its own.  He had a stress test approximately 3 years ago that was reported to be normal.  Patient with a long history of diabetes mellitus with a hemoglobin A1c that runs in the 6-8 range.  Remote history of tobacco abuse but quit many years ago.  Patient was admitted to [facility] from 6/2-22 for further evaluation and treatment.  Patient was taken emergently to the Cath Lab was found to have severe three-vessel disease.  He was medically stabilized and a CT surgical consult was obtained.  On 6/4 patient was taken to the operating room where he underwent a CABG x3.  Patient had a prolonged postop course probably secondary to his obesity and significant  deconditioning.  Postop he did have an episode of atrial fibrillation which was successfully chemically cardioverted and patient was placed on amiodarone and a beta-blocker.  Patient had difficulty with postop atelectasis which required aggressive pulmonary toilet and nebs.  Patient has some postop renal insufficiency requiring renal dosing dopamine which was weaned over time.  Patient responded to diuretics and his discharge creatinine was 1.51.  Patient had acute blood loss anemia which required transfusion.  Patient's blood sugar were difficult to manage requiring frequent adjustment of his insulin.  This was not stated on discharge summary but according to the patient he was put in insulin shock after he was given his home dose of 150 units of Lantus, which is decreased after that.  Patient was quite wary of taking his long-term insulin on admission to skilled nursing facility and was treated with short acting insulin with every meal.  Patient was admitted for OT/PT and is now ready to be discharged to home.    Past Medical History:  Diagnosis Date   Diabetes mellitus    Hypertension    Tobacco abuse     Past Surgical History:  Procedure Laterality Date   CORONARY ARTERY BYPASS GRAFT N/A 10/01/2018   Procedure: CORONARY ARTERY BYPASS GRAFTING (CABG) TIMES  FOUR USING LEFT MAMMARY ARTERY AND RIGHT GREATER SAPHEANOUS VEIN HARVESTED ENDOSCOPICALLY;  Surgeon: Melrose Nakayama, MD;  Location: [facility];  Service: Open Heart Surgery;  Laterality: N/A;   HERNIA REPAIR     >10 years ago  IR RADIOLOGIST EVAL & MGMT  12/24/2016   IR THORACENTESIS ASP PLEURAL SPACE W/IMG GUIDE  10/06/2018   LEFT HEART CATH AND CORONARY ANGIOGRAPHY N/A 09/29/2018   Procedure: LEFT HEART CATH AND CORONARY ANGIOGRAPHY;  Surgeon: Troy Sine, MD;  Location: [facility];  Service: Cardiovascular;  Laterality: N/A;   right shoulder surgery     around 2014   TEE WITHOUT CARDIOVERSION N/A 10/01/2018    Procedure: TRANSESOPHAGEAL ECHOCARDIOGRAM (TEE);  Surgeon: Melrose Nakayama, MD;  Location: [facility];  Service: Open Heart Surgery;  Laterality: N/A;     reports that he quit smoking about 18 years ago. He has never used smokeless tobacco. He reports current alcohol use. He reports that he does not use drugs. Social History   Socioeconomic History   Marital status: Married    Spouse name: Not on file   Number of children: Not on file   Years of education: Not on file   Highest education level: Not on file  Occupational History   Not on file  Social Needs   Financial resource strain: Not on file   Food insecurity    Worry: Not on file    Inability: Not on file   Transportation needs    Medical: Not on file    Non-medical: Not on file  Tobacco Use   Smoking status: Former Smoker    Quit date: 04/29/2000    Years since quitting: 18.5   Smokeless tobacco: Never Used   Tobacco comment: smoked for 10 years on and off  Substance and Sexual Activity   Alcohol use: Yes    Alcohol/week: 0.0 standard drinks    Comment: 6 martinis a year   Drug use: No   Sexual activity: Yes  Lifestyle   Physical activity    Days per week: Not on file    Minutes per session: Not on file   Stress: Not on file  Relationships   Social connections    Talks on phone: Not on file    Gets together: Not on file    Attends religious service: Not on file    Active member of club or organization: Not on file    Attends meetings of clubs or organizations: Not on file    Relationship status: Not on file   Intimate partner violence    Fear of current or ex partner: Not on file    Emotionally abused: Not on file    Physically abused: Not on file    Forced sexual activity: Not on file  Other Topics Concern   Not on file  Social History Narrative   Married. 3 kids. 7 grandkids. No greatgrandkids.       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 If you have any questions, ask your nurse or doctor.  ALPRAZolam 0.5 MG tablet Commonly known as: Xanax Take 1 tablet (0.5 mg total) by mouth 2 (two) times daily.   amiodarone 200 MG tablet Commonly known as: PACERONE Take 1 tablet (200 mg total) by mouth daily.   amLODipine 5 MG tablet Commonly known as: NORVASC Take 1 tablet (5 mg total) by mouth daily.   aspirin 325 MG EC tablet Take 1 tablet (325 mg total) by mouth daily.   Besivance 0.6 % Susp Generic drug: Besifloxacin HCl Place 1 drop into the left eye 3 (three) times daily.   brimonidine 0.2 % ophthalmic solution Commonly known as: ALPHAGAN Place 1 drop into the left eye 3 (three) times daily.   dorzolamide 2 % ophthalmic solution Commonly known as: TRUSOPT Place 1 drop into the left eye 3 (three) times daily.   feeding supplement (GLUCERNA SHAKE) Liqd Take 237 mLs by mouth 3 (three) times daily between meals.   guaiFENesin 600 MG 12 hr tablet Commonly known as: MUCINEX Take 2 tablets (1,200 mg total) by mouth 2 (two) times daily as needed for cough or to loosen phlegm.   Insulin Pen Needle 32G X 6 MM Misc Commonly known as: NovoFine USE 5 TIMES DAILY   Lantus SoloStar 100 UNIT/ML Solostar Pen Generic drug: Insulin Glargine INJECT 150 UNITS INTO THE SKIN EVERY MORNING.   metoprolol tartrate 25 MG tablet Commonly known as: LOPRESSOR Take 0.5 tablets (12.5 mg total) by mouth 2 (two) times daily.   mirtazapine 15 MG  disintegrating tablet Commonly known as: REMERON SOL-TAB Take 1 tablet (15 mg total) by mouth at bedtime.   NovoLOG FlexPen 100 UNIT/ML FlexPen Generic drug: insulin aspart Inject 20 Units into the skin 3 (three) times daily with meals. What changed: See the new instructions. Changed by: Inocencio Homes, MD   OneTouch Verio test strip Generic drug: glucose blood CHECK LEVELS 4 TIMES DAILY   oxyCODONE 5 MG immediate release tablet Commonly known as: Oxy IR/ROXICODONE Take 1 tablet (5 mg total) by mouth every 4 (four) hours as needed for severe pain.   Pitavastatin Calcium 1 MG Tabs Take 1 tablet (1 mg total) by mouth 2 (two) times a week.   tamsulosin 0.4 MG Caps capsule Commonly known as: FLOMAX Take 1 capsule (0.4 mg total) by mouth daily.        Vitals:   10/30/18 2200  BP: 134/84  Pulse: 88  Resp: 18  Temp: 98 F (36.7 C)   There is no height or weight on file to calculate BMI.  Physical Exam  GENERAL APPEARANCE: Alert, conversant. No acute distress.  HEENT: Unremarkable. RESPIRATORY: Breathing is even, unlabored. Lung sounds are clear   CARDIOVASCULAR: Heart RRR no murmurs, rubs or gallops. No peripheral edema.  GASTROINTESTINAL: Abdomen is soft, non-tender, not distended w/ normal bowel sounds.  NEUROLOGIC: Cranial nerves 2-12 grossly intact. Moves all extremities   Labs reviewed: Basic Metabolic Panel: Recent Labs    10/02/18 0208 10/02/18 1619  10/07/18 0653  10/16/18 0426 10/17/18 0338 10/18/18 0411  NA 136 136   < > 137   < > 125* 124* 128*  K 4.1 4.6   < > 4.5   < > 3.8 3.9 3.9  CL 106 104   < > 95*   < > 87* 88* 91*  CO2 22 20*   < > 28   < > 27 27 27   GLUCOSE 118* 188*   < > 56*   < > 173* 181* 119*  BUN 18 22   < >  42*   < > 39* 39* 33*  CREATININE 1.23 1.54*   < > 1.89*   < > 2.00* 1.77* 1.51*  CALCIUM 7.7* 7.7*   < > 8.9   < > 8.4* 8.4* 8.6*  MG 2.5* 2.5*  --  2.8*  --   --   --   --    < > = values in this interval not displayed.    Lab Results  Component Value Date   MICROALBUR 41.6 (H) 08/08/2015   Liver Function Tests: Recent Labs    12/24/17 1044 09/29/18 1140 10/07/18 0653  AST 18 32 55*  ALT 18 22 58*  ALKPHOS 48 51 120  BILITOT 1.0 1.6* 1.1  PROT 7.5 7.3 7.2  ALBUMIN 4.5 3.9 3.2*   No results for input(s): LIPASE, AMYLASE in the last 8760 hours. No results for input(s): AMMONIA in the last 8760 hours. CBC: Recent Labs    12/24/17 1044 09/29/18 1140  10/08/18 0249 10/10/18 0312 10/16/18 0426  WBC 8.8 6.0   < > 10.5 12.9* 17.7*  NEUTROABS 5.4 2.9  --   --   --   --   HGB 13.6 12.4*   < > 7.7* 9.0* 8.9*  HCT 41.3 38.0*   < > 24.3* 27.4* 26.7*  MCV 90.6 92.5   < > 92.0 88.4 86.7  PLT 287.0 33*   < > 335 394 343   < > = values in this interval not displayed.   Lipid Recent Labs    12/24/17 1044 09/29/18 1140  CHOL 218* 205*  HDL 35.80* 27*  LDLCALC 153* 139*  TRIG 144.0 195*   Cardiac Enzymes: No results for input(s): CKTOTAL, CKMB, CKMBINDEX, TROPONINI in the last 8760 hours. BNP: No results for input(s): BNP in the last 8760 hours. CBG: Recent Labs    10/19/18 0606 10/19/18 1118 10/19/18 1609  GLUCAP 193* 185* 179*    Procedures and Imaging Studies During Stay: Dg Chest 1 View  Result Date: 10/06/2018 CLINICAL DATA:  Status post left thoracentesis. EXAM: CHEST  1 VIEW COMPARISON:  Chest x-ray from yesterday. FINDINGS: Stable cardiomediastinal silhouette status post CABG. Persistent low lung volumes with small bilateral pleural effusions and bibasilar atelectasis. No pneumothorax. No acute osseous abnormality. IMPRESSION: 1. Unchanged small bilateral pleural effusions and bibasilar atelectasis. No pneumothorax. Electronically Signed   By: Titus Dubin M.D.   On: 10/06/2018 14:51   Dg Chest 2 View  Result Date: 10/10/2018 CLINICAL DATA:  Atelectasis. EXAM: CHEST - 2 VIEW COMPARISON:  10/08/2018 FINDINGS: Sequelae of CABG are again identified. Lung volumes remain low with  similar appearance of bibasilar lung opacities and small pleural effusions. No overt pulmonary edema or pneumothorax is identified. IMPRESSION: Low lung volumes with unchanged bibasilar atelectasis and small pleural effusions. Electronically Signed   By: Logan Bores M.D.   On: 10/10/2018 11:05   Dg Chest 2 View  Result Date: 10/08/2018 CLINICAL DATA:  Shortness of breath EXAM: CHEST - 2 VIEW COMPARISON:  10/07/2018 FINDINGS: Unchanged low volume examination with minimal interstitial pulmonary opacity consistent with edema and layering bilateral pleural effusions. No new or focal airspace opacity. Cardiomegaly status post median sternotomy and CABG. IMPRESSION: Unchanged low volume examination with minimal interstitial pulmonary opacity consistent with edema and layering bilateral pleural effusions. No new or focal airspace opacity. Cardiomegaly status post median sternotomy and CABG. Electronically Signed   By: Eddie Candle M.D.   On: 10/08/2018 08:28   Dg Chest [facility] [address]  Result Date: 10/07/2018 CLINICAL DATA:  History of coronary bypass grafting and recent left thoracentesis. EXAM: PORTABLE CHEST 1 VIEW COMPARISON:  10/06/2018 FINDINGS: Cardiac shadow is enlarged. Postsurgical changes are again noted. Bilateral pleural effusions are noted right greater than left. No pneumothorax is seen. Mild central vascular congestion is noted as well. IMPRESSION: Changes of CHF with bilateral effusions. Electronically Signed   By: Inez Catalina M.D.   On: 10/07/2018 08:00   Dg Chest Port 1 View  Result Date: 10/06/2018 CLINICAL DATA:  Sore chest post cardiac surgery, diabetes mellitus, hypertension EXAM: PORTABLE CHEST 1 VIEW COMPARISON:  Portable exam 0510 hours compared to 10/05/2018 FINDINGS: Enlargement of cardiac silhouette post CABG. Mediastinal contours prominent but stable, presumed post cardiac surgery. Bibasilar atelectasis and pleural effusions RIGHT greater than LEFT, little changed. Accentuated  perihilar markings stable. No pneumothorax or acute osseous findings. IMPRESSION: Bibasilar effusions and atelectasis RIGHT greater than LEFT. Enlargement of cardiac silhouette post CABG. Electronically Signed   By: Lavonia Dana M.D.   On: 10/06/2018 08:02   Dg Chest Port 1 View  Result Date: 10/05/2018 CLINICAL DATA:  Status post coronary bypass grafting EXAM: PORTABLE CHEST 1 VIEW COMPARISON:  10/04/2018 FINDINGS: Cardiac shadow is prominent but stable. Postsurgical changes are noted. Left jugular central venous sheath is noted and stable. Small bilateral pleural effusions right greater than left are seen. Underlying atelectatic changes are present. No pneumothorax is seen. IMPRESSION: Small bilateral pleural effusions with basilar atelectasis right greater than left. Electronically Signed   By: Inez Catalina M.D.   On: 10/05/2018 07:38   Dg Chest Port 1 View  Result Date: 10/04/2018 CLINICAL DATA:  Status post CABG procedure. EXAM: PORTABLE CHEST 1 VIEW COMPARISON:  Chest radiograph 10/03/2018 FINDINGS: Monitoring leads overlie the patient. Stable cardiomegaly status post median sternotomy. Low lung volumes. Bilateral interstitial pulmonary opacities. Small bilateral pleural effusions with underlying heterogeneous opacities. Left IJ sheath stable in position. IMPRESSION: Low lung volumes, cardiomegaly, interstitial edema, small bilateral effusions and atelectasis. Electronically Signed   By: Lovey Newcomer M.D.   On: 10/04/2018 08:14   Dg Chest Port 1 View  Result Date: 10/03/2018 CLINICAL DATA:  Status post CABG procedure. EXAM: PORTABLE CHEST 1 VIEW COMPARISON:  Chest radiograph 10/02/2018 FINDINGS: Interval retraction PA catheter. Left IJ sheath stable in position. Patient status post median sternotomy. Monitoring leads overlie the patient. Stable cardiac and mediastinal contours. Interval removal mediastinal drain and left chest tube. Low lung volumes. Bibasilar opacities. No definite pneumothorax. Trace  bilateral pleural effusions. IMPRESSION: Interval retraction PA catheter, removal mediastinal drain and left chest tube. Low lung volumes with basilar opacities favored represent atelectasis. Probable trace bilateral pleural effusions. Electronically Signed   By: Lovey Newcomer M.D.   On: 10/03/2018 06:48   Dg Chest Port 1 View  Result Date: 10/02/2018 CLINICAL DATA:  Status post coronary bypass graft. EXAM: PORTABLE CHEST 1 VIEW COMPARISON:  Radiograph October 01, 2018. FINDINGS: Stable cardiomegaly. Status post coronary bypass graft. Endotracheal and nasogastric tubes have been removed. Left internal jugular catheter is noted with tip directed toward right pulmonary artery. Left-sided chest tube is noted without pneumothorax. Stable bibasilar atelectasis is noted with probable minimal pleural effusions. Bony thorax is unremarkable. IMPRESSION: Endotracheal and nasogastric tubes have been removed. Stable position of left-sided chest tube without pneumothorax. Stable bibasilar atelectasis with probable minimal pleural effusions. Electronically Signed   By: Marijo Conception M.D.   On: 10/02/2018 08:33   Dg Chest Port 1 View  Result Date:  10/01/2018 CLINICAL DATA:  Prior CABG. EXAM: PORTABLE CHEST 1 VIEW COMPARISON:  09/29/2018. FINDINGS: Endotracheal tube tip 2.5 cm above the carina. Mediastinal drainage catheter noted over the mid chest. NG tube noted with tip below left hemidiaphragm. Swan-Ganz catheter with tip over right main pulmonary artery. Left chest tube noted with tip over the left lung base. Prior CABG. Cardiomegaly. Bibasilar atelectasis/infiltrates and bilateral pleural effusions. IMPRESSION: 1.  Lines and tubes in good anatomic position as above. 2.  Prior CABG.  Cardiomegaly.  No pulmonary venous congestion. 3. Bibasilar atelectasis/infiltrates and bilateral pleural effusions. Electronically Signed   By: Marcello Moores  Register   On: 10/01/2018 15:09   Ir Thoracentesis Asp Pleural Space W/img Guide  Result  Date: 10/06/2018 INDICATION: Patient with history of MI s/p CABG x3 10/02/2018 by Dr. Koleen Nimrod, dyspnea, and bilateral pleural effusions. Request is made for therapeutic left thoracentesis. EXAM: ULTRASOUND GUIDED THERAPEUTIC LEFT THORACENTESIS MEDICATIONS: 10 mL 1% lidocaine COMPLICATIONS: None immediate. PROCEDURE: An ultrasound guided thoracentesis was thoroughly discussed with the patient and questions answered. The benefits, risks, alternatives and complications were also discussed. The patient understands and wishes to proceed with the procedure. Written consent was obtained. Ultrasound was performed to localize and mark an adequate pocket of fluid in the left chest. The area was then prepped and draped in the normal sterile fashion. 1% Lidocaine was used for local anesthesia. Under ultrasound guidance a 6 Fr Safe-T-Centesis catheter was introduced. Thoracentesis was performed. The catheter was removed and a dressing applied. FINDINGS: A total of approximately 600 mL of dark red fluid was removed. IMPRESSION: Successful ultrasound guided left thoracentesis yielding 600 mL of pleural fluid. Read by: Earley Abide, PA-C Electronically Signed   By: [name].  Shick M.D.   On: 10/06/2018 14:55    Assessment/Plan:   STEMI of right coronary artery  Atrial fibrillation postop  Acute kidney injury postop  Acute postop anemia   Patient is being discharged with the following home health services: OT/PT/nursing  Patient is being discharged with the following durable medical equipment: Rolling walker  Patient has been advised to f/u with their PCP in 1-2 weeks to bring them up to date on their rehab stay.  Social services at facility was responsible for arranging this appointment.  Pt was provided with a 30 day supply of prescriptions for medications and refills must be obtained from their PCP.  For controlled substances, a more limited supply may be provided adequate until PCP appointment only.    Time  spent greater than 30 minutes;> 50% of time with patient was spent reviewing records, labs, tests and studies, counseling and developing plan of care  Hennie Duos MD

## 2018-10-30 ENCOUNTER — Encounter: Payer: Self-pay | Admitting: Internal Medicine

## 2018-11-02 DIAGNOSIS — E1159 Type 2 diabetes mellitus with other circulatory complications: Secondary | ICD-10-CM | POA: Diagnosis not present

## 2018-11-02 DIAGNOSIS — E1142 Type 2 diabetes mellitus with diabetic polyneuropathy: Secondary | ICD-10-CM | POA: Diagnosis not present

## 2018-11-02 DIAGNOSIS — Z951 Presence of aortocoronary bypass graft: Secondary | ICD-10-CM | POA: Diagnosis not present

## 2018-11-02 DIAGNOSIS — I152 Hypertension secondary to endocrine disorders: Secondary | ICD-10-CM | POA: Diagnosis not present

## 2018-11-02 DIAGNOSIS — I252 Old myocardial infarction: Secondary | ICD-10-CM | POA: Diagnosis not present

## 2018-11-02 DIAGNOSIS — Z85828 Personal history of other malignant neoplasm of skin: Secondary | ICD-10-CM | POA: Diagnosis not present

## 2018-11-02 DIAGNOSIS — E1151 Type 2 diabetes mellitus with diabetic peripheral angiopathy without gangrene: Secondary | ICD-10-CM | POA: Diagnosis not present

## 2018-11-02 DIAGNOSIS — Z79891 Long term (current) use of opiate analgesic: Secondary | ICD-10-CM | POA: Diagnosis not present

## 2018-11-02 DIAGNOSIS — I25110 Atherosclerotic heart disease of native coronary artery with unstable angina pectoris: Secondary | ICD-10-CM | POA: Diagnosis not present

## 2018-11-02 DIAGNOSIS — I700 Atherosclerosis of aorta: Secondary | ICD-10-CM | POA: Diagnosis not present

## 2018-11-02 DIAGNOSIS — Z7982 Long term (current) use of aspirin: Secondary | ICD-10-CM | POA: Diagnosis not present

## 2018-11-02 DIAGNOSIS — E785 Hyperlipidemia, unspecified: Secondary | ICD-10-CM | POA: Diagnosis not present

## 2018-11-02 DIAGNOSIS — E11319 Type 2 diabetes mellitus with unspecified diabetic retinopathy without macular edema: Secondary | ICD-10-CM | POA: Diagnosis not present

## 2018-11-02 DIAGNOSIS — Z48812 Encounter for surgical aftercare following surgery on the circulatory system: Secondary | ICD-10-CM | POA: Diagnosis not present

## 2018-11-02 DIAGNOSIS — Z87891 Personal history of nicotine dependence: Secondary | ICD-10-CM | POA: Diagnosis not present

## 2018-11-02 DIAGNOSIS — Z794 Long term (current) use of insulin: Secondary | ICD-10-CM | POA: Diagnosis not present

## 2018-11-02 NOTE — Progress Notes (Signed)
Virtual Visit via Video Note   This visit type was conducted due to national recommendations for restrictions regarding the COVID-19 Pandemic (e.g. social distancing) in an effort to limit this patient's exposure and mitigate transmission in our community.  Due to his co-morbid illnesses, this patient is at least at moderate risk for complications without adequate follow up.  This format is felt to be most appropriate for this patient at this time.  All issues noted in this document were discussed and addressed.  A limited physical exam was performed with this format.  Please refer to the patient's chart for his consent to telehealth for [facility].   Date:  11/03/2018   ID:  Martin Agee Sr., DOB 11-25-1945, MRN [id number]  Patient Location: Home Provider Location: Office  PCP:  Marin Olp, MD  Cardiologist:  Dr. Claiborne Billings  Electrophysiologist:  None   Evaluation Performed:  Follow-Up Visit  Chief Complaint:  [facility] Follow up [facility])  History of Present Illness:    Martin FOLKS Sr. is a 73 y.o. male with we are following post hospitalization after admission for  STEMI of the left main and LAD, as well as sever multivessel CAD. He was subsequently seen by Dr. Roxan Hockey, CVTS, and found to be a candidate for CABG. He had CABG on 10/01/2018 with LIMA to LAD, SVG to 1st diagonal, SVG to PDA, and SVG to posterolateral (Y graft of PD vein). Right leg endoscopic vein harvest. He has other history of Type II diabetes, hypertension, HL and morbid obesity.   He had a complicated and prolonged post operative course. He had postoperative atrial fibrillation, requiring pharmacologic cardioversion with amiodarone and BB. He had difficult weaning from ventilator, requiring aggressive pulmonary toilet. He had significant deconditioning post operatively and had PT.   Blood glucose was also found to be challenging to control. He was to follow up with his PCP for aggressive long term  management. He also had episodes of depression and was elevated by CIR for possible admission to assist with his recovery but was not found to be a candidate. He was discharged to [place]. on 10/19/2018 to [facility].He was discharged home on 10/28/2018.   He continues to have some anxiety postoperatively but otherwise is doing well.  He has not seen his surgeon yet for follow-up and released to perform cardiac rehab activities.  The patient is medically compliant.  He denies any significant postoperative pain.  He is medically compliant.  He is watching what he eats.  He is trying to get up and walk each day for a few minutes.  He states his wife keeps him motivated to do so.  The patient does not have symptoms concerning for COVID-19 infection (fever, chills, cough, or new shortness of breath).    Past Medical History:  Diagnosis Date  . Diabetes mellitus   . Hypertension   . Tobacco abuse    Past Surgical History:  Procedure Laterality Date  . CORONARY ARTERY BYPASS GRAFT N/A 10/01/2018   Procedure: CORONARY ARTERY BYPASS GRAFTING (CABG) TIMES  FOUR USING LEFT MAMMARY ARTERY AND RIGHT GREATER SAPHEANOUS VEIN HARVESTED ENDOSCOPICALLY;  Surgeon: Melrose Nakayama, MD;  Location: [place];  Service: Open Heart Surgery;  Laterality: N/A;  . HERNIA REPAIR     >10 years ago  . IR RADIOLOGIST EVAL & MGMT  12/24/2016  . IR THORACENTESIS ASP PLEURAL SPACE W/IMG GUIDE  10/06/2018  . LEFT HEART CATH AND CORONARY ANGIOGRAPHY N/A 09/29/2018  Procedure: LEFT HEART CATH AND CORONARY ANGIOGRAPHY;  Surgeon: Troy Sine, MD;  Location: [place] [facility];  Service: Cardiovascular;  Laterality: N/A;  . right shoulder surgery     around 2014  . TEE WITHOUT CARDIOVERSION N/A 10/01/2018   Procedure: TRANSESOPHAGEAL ECHOCARDIOGRAM (TEE);  Surgeon: Melrose Nakayama, MD;  Location: [place];  Service: Open Heart Surgery;  Laterality: N/A;     Current Meds  Medication Sig  . ALPRAZolam (XANAX) 0.5 MG  tablet Take 1 tablet (0.5 mg total) by mouth 2 (two) times daily.  Marland Kitchen amiodarone (PACERONE) 200 MG tablet Take 1 tablet (200 mg total) by mouth daily.  Marland Kitchen amLODipine (NORVASC) 5 MG tablet Take 1 tablet (5 mg total) by mouth daily.  Marland Kitchen aspirin EC 325 MG EC tablet Take 1 tablet (325 mg total) by mouth daily.  Marland Kitchen BESIVANCE 0.6 % SUSP Place 1 drop into the left eye 3 (three) times daily. (Patient taking differently: Place 1 drop into the left eye as needed. )  . brimonidine (ALPHAGAN) 0.2 % ophthalmic solution Place 1 drop into the left eye 3 (three) times daily.  . dorzolamide (TRUSOPT) 2 % ophthalmic solution Place 1 drop into the left eye 3 (three) times daily.  . feeding supplement, GLUCERNA SHAKE, (GLUCERNA SHAKE) LIQD Take 237 mLs by mouth 3 (three) times daily between meals.  Marland Kitchen guaiFENesin (MUCINEX) 600 MG 12 hr tablet Take 2 tablets (1,200 mg total) by mouth 2 (two) times daily as needed for cough or to loosen phlegm.  . insulin aspart (NOVOLOG FLEXPEN) 100 UNIT/ML FlexPen Inject 20 Units into the skin 3 (three) times daily with meals.  . Insulin Pen Needle (NOVOFINE) 32G X 6 MM MISC USE 5 TIMES DAILY  . metoprolol tartrate (LOPRESSOR) 25 MG tablet Take 0.5 tablets (12.5 mg total) by mouth 2 (two) times daily.  . mirtazapine (REMERON SOL-TAB) 15 MG disintegrating tablet Take 1 tablet (15 mg total) by mouth at bedtime.  Glory Rosebush VERIO test strip CHECK LEVELS 4 TIMES DAILY  . Pitavastatin Calcium 1 MG TABS Take 1 tablet (1 mg total) by mouth 2 (two) times a week.  . tamsulosin (FLOMAX) 0.4 MG CAPS capsule Take 1 capsule (0.4 mg total) by mouth daily.  . [DISCONTINUED] amiodarone (PACERONE) 200 MG tablet Take 1 tablet (200 mg total) by mouth daily.  . [DISCONTINUED] amLODipine (NORVASC) 5 MG tablet Take 1 tablet (5 mg total) by mouth daily.  . [DISCONTINUED] metoprolol tartrate (LOPRESSOR) 25 MG tablet Take 0.5 tablets (12.5 mg total) by mouth 2 (two) times daily.     Allergies:   Simvastatin    Social History   Tobacco Use  . Smoking status: Former Smoker    Quit date: 04/29/2000    Years since quitting: 18.5  . Smokeless tobacco: Never Used  . Tobacco comment: smoked for 10 years on and off  Substance Use Topics  . Alcohol use: Yes    Alcohol/week: 0.0 standard drinks    Comment: 6 martinis a year  . Drug use: No     Family Hx: The patient's family history includes Diabetes in his maternal grandmother and son; Heart disease in his mother; Hypertension in his mother.  ROS:   Please see the history of present illness.    All other systems reviewed and are negative.   Prior CV studies:   Cardiac Cath 09/29/2018   Mid RCA lesion is 90% stenosed.  Ost RPDA lesion is 30% stenosed.  RPDA-1 lesion is 40%  stenosed.  RPDA-2 lesion is 50% stenosed.  RPDA-3 lesion is 100% stenosed.  Ost LAD lesion is 95% stenosed.  Mid Cx to Dist Cx lesion is 90% stenosed.  Ost 1st Diag lesion is 60% stenosed.  Prox LAD to Mid LAD lesion is 40% stenosed.  Mid LAD lesion is 30% stenosed.  Ost LM to Mid LM lesion is 30% stenosed.   Acute inferior STEMI secondary to ulcerated plaque in the mid RCA with residual 90% stenosis with extensive thrombus but with brisk TIMI-3 flow and probable distal embolization to the apical portion of the PDA vessel.  There is moderate stenoses in the PDA proximal to the distal embolization.  Significant concomitant CAD with 30% left main stenosis; 95% near ostial LAD stenosis, 60% diagonal stenosis with 40 and 30% diffuse mid LAD stenosis; normal high marginal ramus like vessel, with 90% diffuse stenosis in the proximal portion of a distal marginal branch prior to its bifurcation.  Echocardiogram 09/29/2018 1. The left ventricle has normal systolic function, with an ejection fraction of 55-60%. The cavity size was normal. There is mild concentric left ventricular hypertrophy. Left ventricular diastolic Doppler parameters are consistent with impaired   relaxation. Elevated mean left atrial pressure No evidence of left ventricular regional wall motion abnormalities.  2. Left atrial size was mildly dilated.  3. There is mild mitral annular calcification present.  4. The aortic valve is tricuspid. Mild thickening of the aortic valve. Mild calcification of the aortic valve.  5. The interatrial septum was not well visualized.  Labs/Other Tests and Data Reviewed:    EKG:  No ECG reviewed.  Recent Labs: 10/07/2018: ALT 58; Magnesium 2.8 10/16/2018: Hemoglobin 8.9; Platelets 343 10/18/2018: BUN 33; Creatinine, Ser 1.51; Potassium 3.9; Sodium 128   Recent Lipid Panel Lab Results  Component Value Date/Time   CHOL 205 (H) 09/29/2018 11:40 AM   TRIG 195 (H) 09/29/2018 11:40 AM   HDL 27 (L) 09/29/2018 11:40 AM   CHOLHDL 7.6 09/29/2018 11:40 AM   LDLCALC 139 (H) 09/29/2018 11:40 AM   LDLDIRECT 173.0 07/01/2018 11:18 AM    Wt Readings from Last 3 Encounters:  11/03/18 277 lb (125.6 kg)  10/20/18 266 lb 12.2 oz (121 kg)  10/19/18 266 lb 12.1 oz (121 kg)     Objective:    Vital Signs:  BP 134/81   Pulse 80   Ht 6' (1.829 m)   Wt 277 lb (125.6 kg)   BMI 37.57 kg/m    VITAL SIGNS:  reviewed GEN:  no acute distress RESPIRATORY:  normal respiratory effort, symmetric expansion MUSCULOSKELETAL:  Well healed sternotomy site, no evidence eviseration or infection, CT incisions, and right leg harvest site is healing well.  NEURO:  alert and oriented x 3, no obvious focal deficit PSYCH:  normal affect  ASSESSMENT & PLAN:    1.  Coronary artery disease: Multivessel coronary artery disease per cardiac catheterization on 09/29/2018 as above.  Subsequent coronary artery bypass grafting with LIMA to LAD, SVG to first diagonal, SVG to PDA, and SVG to posterior lateral (Y graft of PDA vein).  The patient is recovering well without any significant postoperative pain.  I have reviewed his sternotomy site via video as well as harvest site for vein graft.   All look healthy and without evidence of bleeding evisceration or infection.  He has not yet seen his cardiovascular surgeon postoperatively and is due to see him on 25 November 2018.  I will not make any changes in his medication  regimen at this time.  He will continue on amiodarone 200 mg daily until discontinued by surgeon for postoperative atrial fib.  Will also be referred to cardiac rehab once released by surgeon.  Continue all medications as directed, including metoprolol.  2.  Postoperative atrial fibrillation: He is currently not on anticoagulation therapy, but is taking amiodarone 200 mg daily for suppression of atrial fibrillation.  He denies any rapid heart rhythm or palpitations.  3.  Hypercholesterolemia: Continue Pitivastatincalcium as directed.  4.  Insulin-dependent diabetes: Not well controlled currently.  Blood sugars have been as elevated as 350.  When he takes insulin blood sugars plummet.  The patient is afraid to take too much insulin as a result of this as he ends of having to drink orange juice and eat sugar to bring his blood sugar back up to normal.  He is being followed by Dr. Ellene Route for this.  He is due to see him in a couple of weeks.  COVID-19 Education: The signs and symptoms of COVID-19 were discussed with the patient and how to seek care for testing (follow up with PCP or arrange E-visit). The importance of social distancing was discussed today.  Time:   Today, I have spent 15 minutes with the patient with telehealth technology discussing the above problems.     Medication Adjustments/Labs and Tests Ordered: Current medicines are reviewed at length with the patient today.  Concerns regarding medicines are outlined above.   Tests Ordered: No orders of the defined types were placed in this encounter.   Medication Changes: Meds ordered this encounter  Medications  . amiodarone (PACERONE) 200 MG tablet    Sig: Take 1 tablet (200 mg total) by mouth daily.     Dispense:  90 tablet    Refill:  3  . amLODipine (NORVASC) 5 MG tablet    Sig: Take 1 tablet (5 mg total) by mouth daily.    Dispense:  90 tablet    Refill:  3  . metoprolol tartrate (LOPRESSOR) 25 MG tablet    Sig: Take 0.5 tablets (12.5 mg total) by mouth 2 (two) times daily.    Dispense:  90 tablet    Refill:  3    Disposition:  Follow up in 3 months.  He is advised to call us sooner if he becomes symptomatic.   Signed, Phill Myron. West Pugh, ANP, AACC  11/03/2018 11:12 AM    [facility]

## 2018-11-03 ENCOUNTER — Encounter: Payer: Self-pay | Admitting: Adult Health

## 2018-11-03 ENCOUNTER — Telehealth (INDEPENDENT_AMBULATORY_CARE_PROVIDER_SITE_OTHER): Payer: PPO | Admitting: Adult Health

## 2018-11-03 VITALS — BP 134/81 | HR 80 | Ht 72.0 in | Wt 277.0 lb

## 2018-11-03 DIAGNOSIS — E119 Type 2 diabetes mellitus without complications: Secondary | ICD-10-CM | POA: Diagnosis not present

## 2018-11-03 DIAGNOSIS — IMO0001 Reserved for inherently not codable concepts without codable children: Secondary | ICD-10-CM

## 2018-11-03 DIAGNOSIS — I2510 Atherosclerotic heart disease of native coronary artery without angina pectoris: Secondary | ICD-10-CM

## 2018-11-03 DIAGNOSIS — I480 Paroxysmal atrial fibrillation: Secondary | ICD-10-CM

## 2018-11-03 DIAGNOSIS — Z794 Long term (current) use of insulin: Secondary | ICD-10-CM | POA: Diagnosis not present

## 2018-11-03 DIAGNOSIS — E7800 Pure hypercholesterolemia, unspecified: Secondary | ICD-10-CM

## 2018-11-03 MED ORDER — METOPROLOL TARTRATE 25 MG PO TABS: 12.5000 mg | ORAL_TABLET | Freq: Two times a day (BID) | ORAL | 3 refills | Status: DC

## 2018-11-03 MED ORDER — AMIODARONE HCL 200 MG PO TABS: 200.0000 mg | ORAL_TABLET | Freq: Every day | ORAL | 3 refills | Status: DC

## 2018-11-03 MED ORDER — AMLODIPINE BESYLATE 5 MG PO TABS: 5.0000 mg | ORAL_TABLET | Freq: Every day | ORAL | 3 refills | Status: DC

## 2018-11-03 NOTE — Patient Instructions (Addendum)
Medication Instructions:  Continue current medications  If you need a refill on your cardiac medications before your next appointment, please call your pharmacy.  Labwork: None Ordered   Testing/Procedures: None Ordered  Follow-Up: You will need a follow up appointment in 3 months.  Please call our office 2 months in advance to schedule this appointment.  You may see Dr Claiborne Billings or one of the following Advanced Practice Providers on your designated Care Team: Almyra Deforest, [place] . Fabian Sharp, PA-C     At [facility], you and your health needs are our priority.  As part of our continuing mission to provide you with exceptional heart care, we have created designated Provider Care Teams.  These Care Teams include your primary Cardiologist (physician) and Advanced Practice Providers (APPs -  Physician Assistants and Nurse Practitioners) who all work together to provide you with the care you need, when you need it.  Thank you for choosing [facility] at [facility]!!

## 2018-11-04 NOTE — Progress Notes (Signed)
Phone [phone]   Subjective:  Virtual visit via Video note. Chief complaint: Chief Complaint  Patient presents with   Coronary Artery Disease    This visit type was conducted due to national recommendations for restrictions regarding the COVID-19 Pandemic (e.g. social distancing).  This format is felt to be most appropriate for this patient at this time balancing risks to patient and risks to population by having him in for in person visit.  No physical exam was performed (except for noted visual exam or audio findings with Telehealth visits).    Our team/I connected with [place]. at  8:40 AM EDT by a video enabled telemedicine application (doxy.me or caregility through epic) and verified that I am speaking with the correct person using two identifiers.  Location patient: [facility] Location provider: [facility], office Persons participating in the virtual visit:  patient  Our team/I discussed the limitations of evaluation and management by telemedicine and the availability of in person appointments. In light of current covid-19 pandemic, patient also understands that we are trying to protect them by minimizing in office contact if at all possible.  The patient expressed consent for telemedicine visit and agreed to proceed. Patient understands insurance will be billed.   ROS- still with some discomfort along chest wall with recent CABG. Some anxiety. No fever/chills reported.    Past Medical History-  Patient Active Problem List   Diagnosis Date Noted   Postoperative atrial fibrillation ([place]) 10/24/2018    Priority: High   Depression, recurrent ([place]) 10/24/2018    Priority: High   Coronary artery disease s/p CABG 10/01/2018 after STEMI     Priority: High   Undiagnosed cardiac murmurs 08/15/2015    Priority: High   Morbid obesity ([place]) 04/12/2014    Priority: High   DM (diabetes mellitus) type II uncontrolled with eye manifestation ([place]) 02/03/2007    Priority:  High   PAD (peripheral artery disease) ([place]) 08/20/2017    Priority: Medium   Partial tear of right Achilles tendon 02/12/2017    Priority: Medium   Aortic atherosclerosis ([place]) 10/07/2016    Priority: Medium   History of skin cancer 08/15/2015    Priority: Medium   Diabetic retinopathy ([place]) 02/21/2015    Priority: Medium   Hyperlipidemia associated with type 2 diabetes mellitus ([place]) 08/24/2014    Priority: Medium   Diabetic polyneuropathy ([place]) 04/11/2009    Priority: Medium   Hypertension associated with diabetes ([place]) 02/03/2007    Priority: Medium   Acute blood loss as cause of postoperative anemia 10/24/2018    Priority: Low   BPH (benign prostatic hyperplasia) 10/24/2018    Priority: Low   S/P CABG x 4 10/01/2018    Priority: Low   Onychomycosis of toenail 02/17/2018    Priority: Low   Allergic rhinitis 08/20/2017    Priority: Low   C. difficile colitis 09/25/2016    Priority: Low   History of aspiration pneumonia 09/25/2016    Priority: Low   Former smoker 08/24/2014    Priority: Low   Ingrowing toenail 07/19/2014    Priority: Low   BACK PAIN, LUMBAR, WITH RADICULOPATHY 08/30/2008    Priority: Low   Acute kidney injury ([place]) 09/18/2016    Medications- reviewed and updated Current Outpatient Medications  Medication Sig Dispense Refill   amiodarone (PACERONE) 200 MG tablet Take 1 tablet (200 mg total) by mouth daily. 90 tablet 3   aspirin EC 325 MG EC tablet Take 1 tablet (325 mg total) by  mouth daily. 30 tablet 0   BESIVANCE 0.6 % SUSP Place 1 drop into the left eye 3 (three) times daily. (Patient taking differently: Place 1 drop into the left eye as needed. ) 5 mL 0   brimonidine (ALPHAGAN) 0.2 % ophthalmic solution Place 1 drop into the left eye 3 (three) times daily. 5 mL 0   dorzolamide (TRUSOPT) 2 % ophthalmic solution Place 1 drop into the left eye 3 (three) times daily. 10 mL 0   feeding supplement, GLUCERNA SHAKE, (GLUCERNA  SHAKE) LIQD Take 237 mLs by mouth 3 (three) times daily between meals.  0   guaiFENesin (MUCINEX) 600 MG 12 hr tablet Take 2 tablets (1,200 mg total) by mouth 2 (two) times daily as needed for cough or to loosen phlegm. 120 tablet 0   insulin aspart (NOVOLOG FLEXPEN) 100 UNIT/ML FlexPen Inject 20 Units into the skin 3 (three) times daily with meals. 18 mL 0   Insulin Pen Needle (NOVOFINE) 32G X 6 MM MISC USE 5 TIMES DAILY 300 each 3   metoprolol tartrate (LOPRESSOR) 25 MG tablet Take 0.5 tablets (12.5 mg total) by mouth 2 (two) times daily. 90 tablet 3   mirtazapine (REMERON SOL-TAB) 15 MG disintegrating tablet Take 1 tablet (15 mg total) by mouth at bedtime. 30 tablet 0   ONETOUCH VERIO test strip CHECK LEVELS 4 TIMES DAILY 360 each 3   Pitavastatin Calcium 1 MG TABS Take 1 tablet (1 mg total) by mouth 2 (two) times a week. 9 tablet 0   tamsulosin (FLOMAX) 0.4 MG CAPS capsule Take 1 capsule (0.4 mg total) by mouth daily. 30 capsule 0   amLODipine (NORVASC) 5 MG tablet Take 1 tablet (5 mg total) by mouth daily. 90 tablet 3   No current facility-administered medications for this visit.      Objective:  BP 134/80    Pulse 80    Wt 277 lb (125.6 kg)    BMI 37.57 kg/m  self reported vitals Gen: NAD, resting comfortably Lungs: nonlabored, normal respiratory rate  Skin: appears dry, no obvious rash    Assessment and Plan    #Hospital/rehab follow-up S: 73 year old male who presented to [facility] on June 2 with chest pain and was found to have a STEMI.  Symptoms developed suddenly when driving to [place].  Does admit he has had very high anxiety around COVID-19 pandemic and wonders if that contributed.  Patient had remittent chest pain in the past that would dissipate on its own- had a stress test 3 years ago per discharge summary but I do not see this in the records that was reportedly normal.  CAD have been noted on prior CTs of the chest.  Patient taken emergently to  the Cath Lab and was found to have severe 3 vessel coronary artery disease.  He is stabilized medically and then evaluated by cardiothoracic surgery Dr. Roxan Hockey who recommended CABG-this was completed on October 01, 2018.  Unfortunately patient had a prolonged postoperative course.  Patient developed atrial fibrillation postoperatively and was placed on amiodarone and beta-blocker.  He required aggressive pulmonary toilet due to issues with atelectasis.  Patient had postoperative renal insufficiency requiring renal dose dopamine which was able to be weaned.  He did require diuretic short-term.  Kidney function did not more normalized before hospital discharge and was 1.51 in regards to creatinine before discharge.  Also had acute blood loss anemia requiring a transfusion.  Had difficult to manage diabetes during hospitalization.  Due to  difficulty with progressing his physical activity they considered CIR but he was not considered a candidate- ultimately was discharged to [facility] for rehab-Was at [facility] for 10 days - got out July 2nd.   Patient has been continued on Metoprolol 12.5 mg BID, amiodarone 200mg .  Cardiology has not recommended anticoagulation at this point.  Patient is on aspirin for the coronary artery disease  I reviewed the discharge note from Dr. Sheppard Coil from [facility] on July 1  Patient did have difficulty with blood sugar during hospitalization and afterwards-he statesUnpredictable blood sugar- not eating as much but starting to pick up. Sugar got down to 49 while at [facility]- he is paranoid about it dropping. Not doing insulin shots right now- was 382 on last check. Had a low when took 30 units novolog and dropped at night.   He has had some anxiety postoperatively-was tried on Xanax but was not very helpful and he has stopped that.  He is drinking some Glucerna to try to help with nutrition/protein intake.  I warned him about potential weight gain on mirtazapine but  this is been very helpful for sleep.  Has had some irregular bowel movement since being home.  They ask about Metamucil  Patient will be doing cardiac rehab and physical therapy for the next month or 2-I am happy to sign off on any home health at work as needed A/P: Hospital/rehab follow-up  Coronary artery disease involving native coronary artery of native heart without angina pectoris - Plan: Continue aspirin and Livalo (currently on highest tolerable dose but would ask cardiology opinion on pcsk9 inhibitor).  Has follow-up scheduled with Dr. Roxan Hockey.  Continue cardiology follow-up as well - We will sign off on any PT/OT orders and this will be face-to-face visit used -With health anxiety after this incident recommended behavioral health counseling-patient declines- -Has only been able to tolerate Livalo 2 times a week- would encourage discussion with cardiology about PCSK9 inhibitor  Postoperative atrial fibrillation ([place]) - Plan: Continue amiodarone per cardiology as well as metoprolol.  On aspirin alone for now- defer decision on anticoagulation to cardiology  Acute blood loss as cause of postoperative anemia - Plan: We will need to get labs from [facility] as a CBC was reportedly repeated in rehab  Acute kidney injury [facility]) - Plan: Once again trying to get labs from [facility]- patient had repeat labs in rehab.  Before hospitalization creatinine was in 1.1-1.3 range-up to 2.08 during hospitalization- was back down to 1.51 before discharge  DM (diabetes mellitus) type II uncontrolled with eye manifestation ([place]) - Plan: Patient will follow up with Dr. Loanne Drilling of endocrinology.  Has had some blood sugars over 300 because he is hesitant to take any insulin-had a low when took 30 units of NovoLog-I recommended trial low-dose 8 units before each meal and 5 units before dinner and told him would like to be titrated up next week-I did not have back on his Lantus which he used  to take at 150 units-diet has been improved posthospitalization for patient  Hypertension associated with diabetes ([place]) - Plan: Good control on metoprolol and amlodipine 5 mg-not sure why amlodipine fell off the list but he is taking it right now blood pressure is within goal so we will continue- no obvious heart failure on echocardiogram  Constipation, unspecified constipation type - Plan: BMs not regular- is going to try metamucil 2 hours before or after meals  Future Appointments  Date Time Provider Department  Center  11/10/2018  8:15 AM Renato Shin, MD [facility] None  11/17/2018  4:45 PM Melrose Nakayama, MD [facility]  01/06/2019  9:40 AM Yong Channel [name], MD [facility]   Lab/Order associations:   ICD-10-CM   1. Coronary artery disease involving native coronary artery of native heart without angina pectoris  I25.10   2. Postoperative atrial fibrillation (HCC)  I97.89    I48.91   3. Acute blood loss as cause of postoperative anemia  D62   4. Acute kidney injury ([place])  N17.9   5. DM (diabetes mellitus) type II uncontrolled with eye manifestation (HCC)  E11.39    E11.65   6. Hypertension associated with diabetes ([place])  E11.59    I10   7. Constipation, unspecified constipation type  K59.00     Meds ordered this encounter  Medications   amLODipine (NORVASC) 5 MG tablet    Sig: Take 1 tablet (5 mg total) by mouth daily.    Dispense:  90 tablet    Refill:  3   Return precautions advised.  [name], MD

## 2018-11-05 ENCOUNTER — Encounter: Payer: Self-pay | Admitting: Family Medicine

## 2018-11-05 ENCOUNTER — Telehealth: Payer: Self-pay | Admitting: Physical Therapy

## 2018-11-05 ENCOUNTER — Ambulatory Visit (INDEPENDENT_AMBULATORY_CARE_PROVIDER_SITE_OTHER): Payer: PPO | Admitting: Family Medicine

## 2018-11-05 VITALS — BP 134/80 | HR 80 | Wt 277.0 lb

## 2018-11-05 DIAGNOSIS — E1159 Type 2 diabetes mellitus with other circulatory complications: Secondary | ICD-10-CM

## 2018-11-05 DIAGNOSIS — I2510 Atherosclerotic heart disease of native coronary artery without angina pectoris: Secondary | ICD-10-CM | POA: Diagnosis not present

## 2018-11-05 DIAGNOSIS — I4891 Unspecified atrial fibrillation: Secondary | ICD-10-CM | POA: Diagnosis not present

## 2018-11-05 DIAGNOSIS — E1139 Type 2 diabetes mellitus with other diabetic ophthalmic complication: Secondary | ICD-10-CM

## 2018-11-05 DIAGNOSIS — I9789 Other postprocedural complications and disorders of the circulatory system, not elsewhere classified: Secondary | ICD-10-CM

## 2018-11-05 DIAGNOSIS — E1165 Type 2 diabetes mellitus with hyperglycemia: Secondary | ICD-10-CM

## 2018-11-05 DIAGNOSIS — N179 Acute kidney failure, unspecified: Secondary | ICD-10-CM

## 2018-11-05 DIAGNOSIS — E785 Hyperlipidemia, unspecified: Secondary | ICD-10-CM | POA: Diagnosis not present

## 2018-11-05 DIAGNOSIS — D62 Acute posthemorrhagic anemia: Secondary | ICD-10-CM

## 2018-11-05 DIAGNOSIS — E1169 Type 2 diabetes mellitus with other specified complication: Secondary | ICD-10-CM | POA: Diagnosis not present

## 2018-11-05 DIAGNOSIS — I10 Essential (primary) hypertension: Secondary | ICD-10-CM

## 2018-11-05 DIAGNOSIS — IMO0002 Reserved for concepts with insufficient information to code with codable children: Secondary | ICD-10-CM

## 2018-11-05 DIAGNOSIS — K5900 Constipation, unspecified: Secondary | ICD-10-CM | POA: Diagnosis not present

## 2018-11-05 DIAGNOSIS — I152 Hypertension secondary to endocrine disorders: Secondary | ICD-10-CM

## 2018-11-05 MED ORDER — AMLODIPINE BESYLATE 5 MG PO TABS: 5.0000 mg | ORAL_TABLET | Freq: Every day | ORAL | 3 refills | Status: DC

## 2018-11-05 NOTE — Patient Instructions (Signed)
Health Maintenance Due  Topic Date Due  . URINE MICROALBUMIN  08/07/2016

## 2018-11-05 NOTE — Assessment & Plan Note (Signed)
Postoperative atrial fibrillation ([place]) - Plan: Continue amiodarone per cardiology as well as metoprolol.  On aspirin alone for now- defer decision on anticoagulation to cardiology

## 2018-11-05 NOTE — Telephone Encounter (Signed)
Called [employer] and requested that labs be faxed to [phone].

## 2018-11-05 NOTE — Assessment & Plan Note (Signed)
Now status post CABG -Coronary artery disease involving native coronary artery of native heart without angina pectoris - Plan: Continue aspirin and Livalo (currently on highest tolerable dose but would ask cardiology opinion on pcsk9 inhibitor).  Has follow-up scheduled with Dr. Roxan Hockey.  Continue cardiology follow-up as well - We will sign off on any PT/OT orders and this will be face-to-face visit used -With health anxiety after this incident recommended behavioral health counseling-patient declines

## 2018-11-05 NOTE — Telephone Encounter (Signed)
Copied from [place] [id number]. Topic: General - Other >> Nov 05, 2018 11:40 AM Keene Breath wrote: Reason for CRM: Patient's wife called to ask that Dr. Yong Channel send a medical release for [name] to send the patient's records to Dr. Yong Channel for his review.  Patient stated that Dr. Yong Channel wanted these records to review.  Please advise.  [name] fax# [phone].  Patient's call back # [phone]

## 2018-11-05 NOTE — Assessment & Plan Note (Signed)
Has only been able to tolerate Livalo 2 times a week- would encourage discussion with cardiology about PCSK9 inhibitor

## 2018-11-05 NOTE — Telephone Encounter (Signed)
Can see notes in Epic. Need to find out if labs were drawn while staying at inpatient rehab.

## 2018-11-06 ENCOUNTER — Telehealth: Payer: Self-pay | Admitting: Family Medicine

## 2018-11-06 ENCOUNTER — Encounter: Payer: Self-pay | Admitting: Endocrinology

## 2018-11-06 ENCOUNTER — Other Ambulatory Visit: Payer: Self-pay

## 2018-11-06 ENCOUNTER — Ambulatory Visit (INDEPENDENT_AMBULATORY_CARE_PROVIDER_SITE_OTHER): Payer: PPO | Admitting: Endocrinology

## 2018-11-06 VITALS — Ht 72.0 in

## 2018-11-06 DIAGNOSIS — E1139 Type 2 diabetes mellitus with other diabetic ophthalmic complication: Secondary | ICD-10-CM

## 2018-11-06 DIAGNOSIS — E1165 Type 2 diabetes mellitus with hyperglycemia: Secondary | ICD-10-CM | POA: Diagnosis not present

## 2018-11-06 DIAGNOSIS — IMO0002 Reserved for concepts with insufficient information to code with codable children: Secondary | ICD-10-CM

## 2018-11-06 MED ORDER — NOVOLOG FLEXPEN 100 UNIT/ML ~~LOC~~ SOPN: 15.0000 [IU] | PEN_INJECTOR | Freq: Three times a day (TID) | SUBCUTANEOUS | 0 refills | Status: DC

## 2018-11-06 NOTE — Telephone Encounter (Signed)
Called facility, advised that numerous attempts have been made to fax labs but keep getting busy response. [facility] alternate fax number [phone]. Will await fax.

## 2018-11-06 NOTE — Telephone Encounter (Signed)
Patient did not have labs after June 21-had some lab abnormalities and I would really like to recheck these-I would recommend a CBC and CMP under hypertension on his problem list if he is agreeable- help set him up for labs and order labs please

## 2018-11-06 NOTE — Patient Instructions (Signed)
Please stay off the Lantus, and: Increase the Novolog to 15 units 3 times a day (just before each meal). check your blood sugar twice a day.  vary the time of day when you check, between before the 3 meals, and at bedtime.  also check if you have symptoms of your blood sugar being too high or too low.  please keep a record of the readings and bring it to your next appointment here (or you can bring the meter itself).  You can write it on any piece of paper.  please call us sooner if your blood sugar goes below 70, or if you have a lot of readings over 200.   Please have another visit in 3-4 days, but video.

## 2018-11-06 NOTE — Progress Notes (Signed)
Subjective:    Patient ID: Martin Agee Sr., male    DOB: Mar 08, 1946, 73 y.o.   MRN: [id number]  HPI telehealth visit today via doxy video visit.  Alternatives to telehealth are presented to this patient, and the patient agrees to the telehealth visit. Pt is advised of the cost of the visit, and agrees to this, also.   Patient is at home, and I am at the office.   Persons attending the telehealth visit: the patient, wife, and I Pt returns for f/u of diabetes mellitus:  DM type: Insulin-requiring type 2.  Dx'ed: 0258 Complications: polyneuropathy, foot ulcer, renal insuff, CAD, and DR.  Therapy: insulin since 2013.  DKA: never.   Severe hypoglycemia: never.  Pancreatitis: never.   Other: he takes multiple daily injections; he declines weight loss surgery; he eats 2-3 meals per day.     Interval history: pt says cbg varies from 75-269.  It is in general higher as the day goes on.  He seldom misses the insulin.  5 weeks ago, pt had CABG.  He takes Novolog 3 times a day (just before each meal) 8-8-5 units (he does not take Lantus).  cbg's vary from 300-400's.  It is in general higher as the day goes on.  4 days ago however, he had hypoglycemia after he took 30 units of Novolog.   Past Medical History:  Diagnosis Date  . Diabetes mellitus   . Heart attack ([place]) 10/01/2018   Pt had open heart surgery  . Hypertension   . Tobacco abuse     Past Surgical History:  Procedure Laterality Date  . CORONARY ARTERY BYPASS GRAFT N/A 10/01/2018   Procedure: CORONARY ARTERY BYPASS GRAFTING (CABG) TIMES  FOUR USING LEFT MAMMARY ARTERY AND RIGHT GREATER SAPHEANOUS VEIN HARVESTED ENDOSCOPICALLY;  Surgeon: Melrose Nakayama, MD;  Location: [facility];  Service: Open Heart Surgery;  Laterality: N/A;  . HERNIA REPAIR     >10 years ago  . IR RADIOLOGIST EVAL & MGMT  12/24/2016  . IR THORACENTESIS ASP PLEURAL SPACE W/IMG GUIDE  10/06/2018  . LEFT HEART CATH AND CORONARY ANGIOGRAPHY N/A 09/29/2018   Procedure: LEFT HEART CATH AND CORONARY ANGIOGRAPHY;  Surgeon: Troy Sine, MD;  Location: [facility];  Service: Cardiovascular;  Laterality: N/A;  . open heart surfery    . right shoulder surgery     around 2014  . TEE WITHOUT CARDIOVERSION N/A 10/01/2018   Procedure: TRANSESOPHAGEAL ECHOCARDIOGRAM (TEE);  Surgeon: Melrose Nakayama, MD;  Location: [facility];  Service: Open Heart Surgery;  Laterality: N/A;    Social History   Socioeconomic History  . Marital status: Married    Spouse name: Not on file  . Number of children: Not on file  . Years of education: Not on file  . Highest education level: Not on file  Occupational History  . Not on file  Social Needs  . Financial resource strain: Not on file  . Food insecurity    Worry: Not on file    Inability: Not on file  . Transportation needs    Medical: Not on file    Non-medical: Not on file  Tobacco Use  . Smoking status: Former Smoker    Quit date: 04/29/2000    Years since quitting: 18.5  . Smokeless tobacco: Never Used  . Tobacco comment: smoked for 10 years on and off  Substance and Sexual Activity  . Alcohol use: Yes    Alcohol/week: 0.0 standard drinks  Comment: 6 martinis a year  . Drug use: No  . Sexual activity: Yes  Lifestyle  . Physical activity    Days per week: Not on file    Minutes per session: Not on file  . Stress: Not on file  Relationships  . Social Herbalist on phone: Not on file    Gets together: Not on file    Attends religious service: Not on file    Active member of club or organization: Not on file    Attends meetings of clubs or organizations: Not on file    Relationship status: Not on file  . Intimate partner violence    Fear of current or ex partner: Not on file    Emotionally abused: Not on file    Physically abused: Not on file    Forced sexual activity: Not on file  Other Topics Concern  . Not on file  Social History Narrative   Married. 3 kids. 7  grandkids. No greatgrandkids.       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 amiodarone (PACERONE) 200 MG tablet Take 1 tablet (200 mg total) by mouth daily. 90 tablet 3  . amLODipine (NORVASC) 5 MG tablet Take 1 tablet (5 mg total) by mouth daily. 90 tablet 3  . aspirin EC 325 MG EC tablet Take 1 tablet (325 mg total) by mouth daily. 30 tablet 0  . BESIVANCE 0.6 % SUSP Place 1 drop into the left eye 3 (three) times daily. (Patient taking differently: Place 1 drop into the left eye as needed. ) 5 mL 0  . brimonidine (ALPHAGAN) 0.2 % ophthalmic solution Place 1 drop into the left eye 3 (three) times daily. 5 mL 0  . dorzolamide (TRUSOPT) 2 % ophthalmic solution Place 1 drop into the left eye 3 (three) times daily. 10 mL 0  . feeding supplement, GLUCERNA SHAKE, (GLUCERNA SHAKE) LIQD Take 237 mLs by mouth 3 (three) times daily between meals.  0  . guaiFENesin (MUCINEX) 600 MG 12 hr tablet Take 2 tablets (1,200 mg total) by mouth 2 (two) times daily as needed for cough or to loosen phlegm. 120 tablet 0  . Insulin Pen Needle (NOVOFINE) 32G X 6 MM MISC USE 5 TIMES DAILY 300 each 3  . metoprolol tartrate (LOPRESSOR) 25 MG tablet Take 0.5 tablets (12.5 mg total) by mouth 2 (two) times daily. 90 tablet 3  . mirtazapine (REMERON SOL-TAB) 15 MG disintegrating tablet Take 1 tablet (15 mg total) by mouth at bedtime. 30 tablet 0  . ONETOUCH VERIO test strip CHECK LEVELS 4 TIMES DAILY 360 each 3  . Pitavastatin Calcium 1 MG TABS Take 1 tablet (1 mg total) by mouth 2 (two) times a week. 9 tablet 0  . tamsulosin (FLOMAX) 0.4 MG CAPS capsule Take 1 capsule (0.4 mg total) by mouth daily. 30 capsule 0   No current facility-administered medications on file prior to visit.     Allergies  Allergen Reactions  . Simvastatin Diarrhea    Family History   Problem Relation Age of Onset  . Hypertension Mother   . Heart disease Mother        CHF did not see doctor  . Diabetes Maternal Grandmother   . Diabetes Son     Ht 6' (1.829 m)  BMI 37.57 kg/m    Review of Systems He has lost weight.  He denies hypoglycemia.      Objective:   Physical Exam    Lab Results  Component Value Date   HGBA1C 8.1 (H) 09/29/2018       Assessment & Plan:  Insulin-requiring type 2 DM, with DR: worse.  CAD: his insulin requirement  Will prob return to preop level with time.   Hypoglycemia: he should take less than the 30 units of novolog  Renal insuff: this is the likely reason why he does not seem to need basal insulin now.   Patient Instructions  Please stay off the Lantus, and: Increase the Novolog to 15 units 3 times a day (just before each meal). check your blood sugar twice a day.  vary the time of day when you check, between before the 3 meals, and at bedtime.  also check if you have symptoms of your blood sugar being too high or too low.  please keep a record of the readings and bring it to your next appointment here (or you can bring the meter itself).  You can write it on any piece of paper.  please call us sooner if your blood sugar goes below 70, or if you have a lot of readings over 200.   Please have another visit in 3-4 days, but video.

## 2018-11-09 NOTE — Telephone Encounter (Signed)
Labs still not received. Pt has signed ROI form and will mail it to the office tomorrow.

## 2018-11-09 NOTE — Telephone Encounter (Signed)
Called and spoke with patient and wife. He is unable to come in for labs right now but they have signed ROI form to request labs that were drawn there be faxed to Dr. Yong Channel. They will mail ROI form tomorrow.   Forwarding to Dr. Yong Channel as Juluis Rainier.

## 2018-11-09 NOTE — Telephone Encounter (Signed)
We received the records and the last labs that were done were the ones that were listed in the computer from June 21-unless he has had labs after that would recommend updating labs- if he wants to defer that is ultimately his decision.

## 2018-11-10 ENCOUNTER — Ambulatory Visit: Payer: PPO | Admitting: Endocrinology

## 2018-11-10 ENCOUNTER — Telehealth (HOSPITAL_COMMUNITY): Payer: Self-pay

## 2018-11-10 NOTE — Telephone Encounter (Signed)
Pt insurance is active and benefits verified through [employer]. Co-pay $15.00, DED $0.00/$0.00 met, out of pocket $3,400.00/$62.26 met, co-insurance 0%. No pre-authorization required. [employer], 11/10/2018 @ 11:30AM, QZY#[id number]  Patient will need to complete follow up appt. Once completed, patient will be contacted for scheduling upon review by the RN Navigator.

## 2018-11-11 ENCOUNTER — Other Ambulatory Visit: Payer: Self-pay

## 2018-11-11 ENCOUNTER — Ambulatory Visit (INDEPENDENT_AMBULATORY_CARE_PROVIDER_SITE_OTHER): Payer: PPO | Admitting: Endocrinology

## 2018-11-11 DIAGNOSIS — E1165 Type 2 diabetes mellitus with hyperglycemia: Secondary | ICD-10-CM

## 2018-11-11 DIAGNOSIS — IMO0002 Reserved for concepts with insufficient information to code with codable children: Secondary | ICD-10-CM

## 2018-11-11 DIAGNOSIS — E1139 Type 2 diabetes mellitus with other diabetic ophthalmic complication: Secondary | ICD-10-CM | POA: Diagnosis not present

## 2018-11-11 MED ORDER — NOVOLOG FLEXPEN 100 UNIT/ML ~~LOC~~ SOPN: 18.0000 [IU] | PEN_INJECTOR | Freq: Three times a day (TID) | SUBCUTANEOUS | 0 refills | Status: DC

## 2018-11-11 NOTE — Patient Instructions (Signed)
Please stay off the Lantus, and: Increase the Novolog to 18 units 3 times a day (just before each meal), no matter what your blood sugar is. check your blood sugar twice a day.  vary the time of day when you check, between before the 3 meals, and at bedtime.  also check if you have symptoms of your blood sugar being too high or too low.  please keep a record of the readings and bring it to your next appointment here (or you can bring the meter itself).  You can write it on any piece of paper.  please call us sooner if your blood sugar goes below 70, or if you have a lot of readings over 200.   Please have another visit in 7 days, by video.

## 2018-11-11 NOTE — Progress Notes (Signed)
Subjective:    Patient ID: Martin Agee Sr., male    DOB: June 26, 1945, 73 y.o.   MRN: [id number]  HPI telehealth visit today via doxy video visit.  Alternatives to telehealth are presented to this patient, and the patient agrees to the telehealth visit. Pt is advised of the cost of the visit, and agrees to this, also.   Patient is at home, and I am at the office.   Persons attending the telehealth visit: the patient, wife, and I Pt returns for f/u of diabetes mellitus:  DM type: Insulin-requiring type 2.  Dx'ed: 6644 Complications: polyneuropathy, foot ulcer, renal insuff, CAD, and DR.  Therapy: insulin since 2013.  DKA: never.   Severe hypoglycemia: never.  Pancreatitis: never.   Other: he takes multiple daily injections; he declines weight loss surgery; he eats 2-3 meals per day.     Interval history: Since on 15 units 3 times a day (just before each meal), cbg varies from 138-360.  It is in general higher as the day goes on.  pt states he feels better in general.   Past Medical History:  Diagnosis Date  . Diabetes mellitus   . Heart attack ([place]) 10/01/2018   Pt had open heart surgery  . Hypertension   . Tobacco abuse     Past Surgical History:  Procedure Laterality Date  . CORONARY ARTERY BYPASS GRAFT N/A 10/01/2018   Procedure: CORONARY ARTERY BYPASS GRAFTING (CABG) TIMES  FOUR USING LEFT MAMMARY ARTERY AND RIGHT GREATER SAPHEANOUS VEIN HARVESTED ENDOSCOPICALLY;  Surgeon: Melrose Nakayama, MD;  Location: [place];  Service: Open Heart Surgery;  Laterality: N/A;  . HERNIA REPAIR     >10 years ago  . IR RADIOLOGIST EVAL & MGMT  12/24/2016  . IR THORACENTESIS ASP PLEURAL SPACE W/IMG GUIDE  10/06/2018  . LEFT HEART CATH AND CORONARY ANGIOGRAPHY N/A 09/29/2018   Procedure: LEFT HEART CATH AND CORONARY ANGIOGRAPHY;  Surgeon: Troy Sine, MD;  Location: [place] [facility];  Service: Cardiovascular;  Laterality: N/A;  . open heart surfery    . right shoulder surgery     around  2014  . TEE WITHOUT CARDIOVERSION N/A 10/01/2018   Procedure: TRANSESOPHAGEAL ECHOCARDIOGRAM (TEE);  Surgeon: Melrose Nakayama, MD;  Location: [place];  Service: Open Heart Surgery;  Laterality: N/A;    Social History   Socioeconomic History  . Marital status: Married    Spouse name: Not on file  . Number of children: Not on file  . Years of education: Not on file  . Highest education level: Not on file  Occupational History  . Not on file  Social Needs  . Financial resource strain: Not on file  . Food insecurity    Worry: Not on file    Inability: Not on file  . Transportation needs    Medical: Not on file    Non-medical: Not on file  Tobacco Use  . Smoking status: Former Smoker    Quit date: 04/29/2000    Years since quitting: 18.5  . Smokeless tobacco: Never Used  . Tobacco comment: smoked for 10 years on and off  Substance and Sexual Activity  . Alcohol use: Yes    Alcohol/week: 0.0 standard drinks    Comment: 6 martinis a year  . Drug use: No  . Sexual activity: Yes  Lifestyle  . Physical activity    Days per week: Not on file    Minutes per session: Not on file  .  Stress: Not on file  Relationships  . Social Herbalist on phone: Not on file    Gets together: Not on file    Attends religious service: Not on file    Active member of club or organization: Not on file    Attends meetings of clubs or organizations: Not on file    Relationship status: Not on file  . Intimate partner violence    Fear of current or ex partner: Not on file    Emotionally abused: Not on file    Physically abused: Not on file    Forced sexual activity: Not on file  Other Topics Concern  . Not on file  Social History Narrative   Married. 3 kids. 7 grandkids. No greatgrandkids.       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 amiodarone (PACERONE) 200 MG tablet Take 1 tablet (200 mg total) by mouth daily. 90 tablet 3  . amLODipine (NORVASC) 5 MG tablet Take 1 tablet (5 mg total) by mouth daily. 90 tablet 3  . aspirin EC 325 MG EC tablet Take 1 tablet (325 mg total) by mouth daily. 30 tablet 0  . BESIVANCE 0.6 % SUSP Place 1 drop into the left eye 3 (three) times daily. (Patient taking differently: Place 1 drop into the left eye as needed. ) 5 mL 0  . brimonidine (ALPHAGAN) 0.2 % ophthalmic solution Place 1 drop into the left eye 3 (three) times daily. 5 mL 0  . dorzolamide (TRUSOPT) 2 % ophthalmic solution Place 1 drop into the left eye 3 (three) times daily. 10 mL 0  . feeding supplement, GLUCERNA SHAKE, (GLUCERNA SHAKE) LIQD Take 237 mLs by mouth 3 (three) times daily between meals.  0  . guaiFENesin (MUCINEX) 600 MG 12 hr tablet Take 2 tablets (1,200 mg total) by mouth 2 (two) times daily as needed for cough or to loosen phlegm. 120 tablet 0  . Insulin Pen Needle (NOVOFINE) 32G X 6 MM MISC USE 5 TIMES DAILY 300 each 3  . metoprolol tartrate (LOPRESSOR) 25 MG tablet Take 0.5 tablets (12.5 mg total) by mouth 2 (two) times daily. 90 tablet 3  . mirtazapine (REMERON SOL-TAB) 15 MG disintegrating tablet Take 1 tablet (15 mg total) by mouth at bedtime. 30 tablet 0  . ONETOUCH VERIO test strip CHECK LEVELS 4 TIMES DAILY 360 each 3  . Pitavastatin Calcium 1 MG TABS Take 1 tablet (1 mg total) by mouth 2 (two) times a week. 9 tablet 0  . tamsulosin (FLOMAX) 0.4 MG CAPS capsule Take 1 capsule (0.4 mg total) by mouth daily. 30 capsule 0   No current facility-administered medications on file prior to visit.     Allergies  Allergen Reactions  . Simvastatin Diarrhea    Family History  Problem Relation Age of Onset  . Hypertension Mother   . Heart disease Mother        CHF did not see doctor  . Diabetes Maternal Grandmother   . Diabetes Son     There were no vitals taken for this visit.  Review of  Systems He denies hypoglycemia.  He has lost a few lbs.      Objective:   Physical Exam       Assessment & Plan:  Insulin-requiring type 2 DM,  with CAD: he needs increased rx Renal insuff: in this setting, he does not need basal insulin, at least for now Weight loss, prob due to postop state: we discussed the need to not regain, as this would increase insulin need  Patient Instructions  Please stay off the Lantus, and: Increase the Novolog to 18 units 3 times a day (just before each meal), no matter what your blood sugar is. check your blood sugar twice a day.  vary the time of day when you check, between before the 3 meals, and at bedtime.  also check if you have symptoms of your blood sugar being too high or too low.  please keep a record of the readings and bring it to your next appointment here (or you can bring the meter itself).  You can write it on any piece of paper.  please call us sooner if your blood sugar goes below 70, or if you have a lot of readings over 200.   Please have another visit in 7 days, by video.

## 2018-11-13 ENCOUNTER — Telehealth: Payer: Self-pay

## 2018-11-13 ENCOUNTER — Other Ambulatory Visit: Payer: Self-pay | Admitting: Internal Medicine

## 2018-11-13 NOTE — Telephone Encounter (Signed)
Patient's wife, [name] contacted the office requesting patient's follow-up CABG appointment with Dr. Roxan Hockey to be rescheduled out one more week.  She stated that her husband has been doing physical therapy but is not back to baseline and could potentially have trouble making it to the appointment.  She stated he has not used stairs since surgery and will have to climb steps at his home.  Upon observation, next available appointment was over a month away.  I advised against rescheduling and to keep his appointment.  I did say that if some reason he was just unable to make it to give the office a call and we could reschedule at that point.  She acknowledged receipt.

## 2018-11-16 ENCOUNTER — Other Ambulatory Visit: Payer: Self-pay | Admitting: Thoracic Surgery (Cardiothoracic Vascular Surgery)

## 2018-11-16 DIAGNOSIS — Z951 Presence of aortocoronary bypass graft: Secondary | ICD-10-CM

## 2018-11-17 ENCOUNTER — Ambulatory Visit
Admission: RE | Admit: 2018-11-17 | Discharge: 2018-11-17 | Disposition: A | Discharge status: Home / Self Care | Payer: PPO | Source: Ambulatory Visit | Attending: Thoracic Surgery (Cardiothoracic Vascular Surgery) | Admitting: Thoracic Surgery (Cardiothoracic Vascular Surgery)

## 2018-11-17 ENCOUNTER — Other Ambulatory Visit: Payer: Self-pay | Admitting: *Deleted

## 2018-11-17 ENCOUNTER — Encounter: Payer: Self-pay | Admitting: Thoracic Surgery (Cardiothoracic Vascular Surgery)

## 2018-11-17 ENCOUNTER — Ambulatory Visit (INDEPENDENT_AMBULATORY_CARE_PROVIDER_SITE_OTHER): Payer: Self-pay | Admitting: Thoracic Surgery (Cardiothoracic Vascular Surgery)

## 2018-11-17 ENCOUNTER — Other Ambulatory Visit: Payer: Self-pay

## 2018-11-17 VITALS — BP 134/75 | HR 91 | Temp 97.5°F | Resp 16 | Ht 72.0 in | Wt 266.0 lb

## 2018-11-17 DIAGNOSIS — IMO0002 Reserved for concepts with insufficient information to code with codable children: Secondary | ICD-10-CM

## 2018-11-17 DIAGNOSIS — Z951 Presence of aortocoronary bypass graft: Secondary | ICD-10-CM

## 2018-11-17 DIAGNOSIS — R0602 Shortness of breath: Secondary | ICD-10-CM | POA: Diagnosis not present

## 2018-11-17 DIAGNOSIS — E1139 Type 2 diabetes mellitus with other diabetic ophthalmic complication: Secondary | ICD-10-CM

## 2018-11-17 MED ORDER — POTASSIUM CHLORIDE CRYS ER 10 MEQ PO TBCR: 10.0000 meq | EXTENDED_RELEASE_TABLET | Freq: Every day | ORAL | 3 refills | Status: DC

## 2018-11-17 MED ORDER — AMOXICILLIN-POT CLAVULANATE 875-125 MG PO TABS: 1.0000 | ORAL_TABLET | Freq: Two times a day (BID) | ORAL | Status: DC

## 2018-11-17 MED ORDER — FUROSEMIDE 40 MG PO TABS: 40.0000 mg | ORAL_TABLET | Freq: Every day | ORAL | 3 refills | Status: DC

## 2018-11-17 NOTE — Progress Notes (Signed)
[address]       [place], [address]             [phone]     HPI: Mr. Lyerly returns for a scheduled follow-up visit  Darrin Koman is a 73 year old gentleman with a past medical history significant for hypertension, hyperlipidemia, insulin-dependent type 2 diabetes complicated by retinopathy and neuropathy, and obesity.  He presented with arm numbness, dizziness and general malaise.  He was taken to the Cath Lab as a STEMI.  He had severe three-vessel disease.  He did not require emergency surgery.  I did CABG x4 on 10/01/2018.  We had to use his mammary as a free graft.  He had a complicated postoperative course with profound weakness, orthostatic hypotension, anemia requiring transfusion, atrial fibrillation, acute kidney injury, and severe deconditioning.  He had difficulty working with physical therapy due to neuropathy in his feet.  He had a severe dysphoric mood and we started him on Remeron.  He ultimately was discharged [facility] on 10/19/2018.  He initially had a difficult time at rehab as well.  He is now at home and physical therapy is continue to work with him.  His wife says that he has made a tremendous turnaround in the past week and is able to move much better.  He is able to get himself up.  He feels like he is starting to get some energy back.  He still complains of a poor appetite and food tastes bland.  He has lost a significant amount of weight.  He has had swelling in his legs.  He is noticed some redness around his chest incision and says it has been that way for about 2 weeks.  Past Medical History:  Diagnosis Date  . Diabetes mellitus   . Heart attack ([place]) 10/01/2018   Pt had open heart surgery  . Hypertension   . Tobacco abuse     Current Outpatient Medications  Medication Sig Dispense Refill  . amiodarone (PACERONE) 200 MG tablet Take 1 tablet (200 mg total) by mouth daily. 90 tablet 3  . amLODipine (NORVASC) 5 MG tablet Take  1 tablet (5 mg total) by mouth daily. 90 tablet 3  . aspirin EC 325 MG EC tablet Take 1 tablet (325 mg total) by mouth daily. 30 tablet 0  . BESIVANCE 0.6 % SUSP Place 1 drop into the left eye 3 (three) times daily. (Patient taking differently: Place 1 drop into the left eye as needed. ) 5 mL 0  . brimonidine (ALPHAGAN) 0.2 % ophthalmic solution Place 1 drop into the left eye 3 (three) times daily. 5 mL 0  . dorzolamide (TRUSOPT) 2 % ophthalmic solution Place 1 drop into the left eye 3 (three) times daily. 10 mL 0  . feeding supplement, GLUCERNA SHAKE, (GLUCERNA SHAKE) LIQD Take 237 mLs by mouth 3 (three) times daily between meals.  0  . guaiFENesin (MUCINEX) 600 MG 12 hr tablet Take 2 tablets (1,200 mg total) by mouth 2 (two) times daily as needed for cough or to loosen phlegm. 120 tablet 0  . insulin aspart (NOVOLOG FLEXPEN) 100 UNIT/ML FlexPen Inject 18 Units into the skin 3 (three) times daily with meals. 5 pen 0  . metoprolol tartrate (LOPRESSOR) 25 MG tablet Take 0.5 tablets (12.5 mg total) by mouth 2 (two) times daily. 90 tablet 3  . mirtazapine (REMERON SOL-TAB) 15 MG disintegrating tablet Take 1 tablet (15 mg total) by mouth at  bedtime. 30 tablet 0  . ONETOUCH VERIO test strip CHECK LEVELS 4 TIMES DAILY 360 each 3  . Pitavastatin Calcium 1 MG TABS Take 1 tablet (1 mg total) by mouth 2 (two) times a week. 9 tablet 0  . tamsulosin (FLOMAX) 0.4 MG CAPS capsule Take 1 capsule (0.4 mg total) by mouth daily. 30 capsule 0  . furosemide (LASIX) 40 MG tablet Take 1 tablet (40 mg total) by mouth daily. 30 tablet 3  . potassium chloride (K-DUR) 10 MEQ tablet Take 1 tablet (10 mEq total) by mouth daily. 30 tablet 3   Current Facility-Administered Medications  Medication Dose Route Frequency Provider Last Rate Last Dose  . amoxicillin-clavulanate (AUGMENTIN) 875-125 MG per tablet 1 tablet  1 tablet Oral Q12H Melrose Nakayama, MD        Physical Exam BP 134/75 (BP Location: Left Arm, Patient  Position: Sitting, Cuff Size: Normal)   Pulse 91   Temp (!) 97.5 F (36.4 C) Comment: THERMAL  Resp 16   Ht 6' (1.829 m)   Wt 266 lb (120.7 kg)   SpO2 91% Comment: RA  BMI 36.68 kg/m  73 year old man in no acute distress Alert and oriented x3 with no focal deficits Much more animated than he was while in the hospital Lungs diminished breath sounds both bases Cardiac regular rate and rhythm Sternal incision intact, mild erythema around incision extending approximately 3 to 4 cm on either side, no fluctuance, sternum stable 3+ edema both lower extremities  Diagnostic Tests: CHEST - 2 VIEW  COMPARISON:  10/10/2018  FINDINGS: The heart size and pulmonary vascularity are normal. There are persistent small bilateral pleural effusions, slightly diminished. Improved slight atelectasis at the lung bases.  Bones are normal. CABG.  IMPRESSION: Slight decrease in the small bilateral pleural effusions and bibasilar atelectasis.   Electronically Signed   By: Lorriane Shire M.D.   On: 11/17/2018 16:27  I personally reviewed the chest x-ray images and concur with the findings noted above  Impression: Garth Diffley is a 73 year old gentleman with multiple cardiac risk factors including obesity, hypertension, hyperlipidemia, and diabetes.  He presented as a STEMI in early June.  At catheterization he had three-vessel disease.  He underwent coronary artery bypass grafting x4 on 10/01/2018.  His procedure went smoothly, but his postoperative course was anything but that.  He had very slow progression with orthostatic hypotension, acute kidney injury, atrial fibrillation, deconditioning, and depression.  He ultimately was discharged on postoperative day #18.  He now is about a month and a half out from surgery.  He looks dramatically better today than he did when he left the hospital.  He is much more animated and interactive.  He appears much less depressed.  He and his wife both noticed  a significant improvement over the past week or so.  He is significantly volume overloaded with peripheral edema and bilateral effusions.  I am on a put him on Lasix 40 mg daily and potassium 10 mEq daily.  I am going to check a basic metabolic panel to see where his creatinine and potassium are.  His creatinine was down to 1.5 prior to discharge from the hospital.  He has some redness around the sternal incision.  He is not having any fevers or chills.  There is no drainage or sternal instability.  Because of the potential seriousness of an infection in that area I am going to put him on Augmentin 875/125 1 tablet p.o. twice daily for 10 days.  He was instructed to take all of the antibiotics even if the redness went away.  Deconditioning-continue to work with physical therapy.  He should not lift anything over 10pounds for another 2 weeks.  He should not drive until after he sees me back in 3 weeks.  He and his wife know to call immediately if he develops any worsening redness, pain, fever, chills, or drainage from his sternal incision.  He is not yet ready to drive  Plan: Lasix 40 mg daily, potassium 10 mEq daily Check BMET Augmentin 875/125 1 tablet p.o. twice daily x10 days Return in 3 weeks  Melrose Nakayama, MD [facility] [phone]

## 2018-11-17 NOTE — Telephone Encounter (Signed)
Pt stated that he had labs drawn while at [facility] out patient rehab. Waiting for ROI form to be signed so that we can fax to out patient rehab to request lab results. See previous phone encounter.

## 2018-11-18 ENCOUNTER — Telehealth: Payer: Self-pay

## 2018-11-18 ENCOUNTER — Other Ambulatory Visit: Payer: Self-pay | Admitting: *Deleted

## 2018-11-18 DIAGNOSIS — Z48812 Encounter for surgical aftercare following surgery on the circulatory system: Secondary | ICD-10-CM | POA: Diagnosis not present

## 2018-11-18 DIAGNOSIS — Z951 Presence of aortocoronary bypass graft: Secondary | ICD-10-CM | POA: Diagnosis not present

## 2018-11-18 DIAGNOSIS — L539 Erythematous condition, unspecified: Secondary | ICD-10-CM

## 2018-11-18 MED ORDER — AMOXICILLIN-POT CLAVULANATE 875-125 MG PO TABS: 1.0000 | ORAL_TABLET | Freq: Two times a day (BID) | ORAL | 0 refills | Status: AC

## 2018-11-18 NOTE — Telephone Encounter (Signed)
Arbutus Ped, nurse with [facility] contacted the office [phone] requesting to have Mr. Kuchera's labs drawn by [facility] instead of patient going to have his labs drawn.  Verbal orders given.  Will await return phone call if warranted.

## 2018-11-18 NOTE — Telephone Encounter (Signed)
Faxed request for lab results to [facility] rehab at [phone].

## 2018-11-19 ENCOUNTER — Other Ambulatory Visit: Payer: Self-pay

## 2018-11-19 ENCOUNTER — Ambulatory Visit (INDEPENDENT_AMBULATORY_CARE_PROVIDER_SITE_OTHER): Payer: PPO | Admitting: Endocrinology

## 2018-11-19 ENCOUNTER — Encounter: Payer: Self-pay | Admitting: Endocrinology

## 2018-11-19 DIAGNOSIS — IMO0002 Reserved for concepts with insufficient information to code with codable children: Secondary | ICD-10-CM

## 2018-11-19 DIAGNOSIS — E1139 Type 2 diabetes mellitus with other diabetic ophthalmic complication: Secondary | ICD-10-CM | POA: Diagnosis not present

## 2018-11-19 DIAGNOSIS — E1165 Type 2 diabetes mellitus with hyperglycemia: Secondary | ICD-10-CM

## 2018-11-19 MED ORDER — NOVOLOG FLEXPEN 100 UNIT/ML ~~LOC~~ SOPN: 21.0000 [IU] | PEN_INJECTOR | Freq: Three times a day (TID) | SUBCUTANEOUS | 0 refills | Status: DC

## 2018-11-19 NOTE — Patient Instructions (Addendum)
Please stay off the Lantus, and:  Increase the Novolog to 21 units 3 times a day (just before each meal), no matter what your blood sugar is. check your blood sugar twice a day.  vary the time of day when you check, between before the 3 meals, and at bedtime.  also check if you have symptoms of your blood sugar being too high or too low.  please keep a record of the readings and bring it to your next appointment here (or you can bring the meter itself).  You can write it on any piece of paper.  please call us sooner if your blood sugar goes below 70, or if you have a lot of readings over 200.   Please have another visit in 7 days, by video.

## 2018-11-19 NOTE — Progress Notes (Signed)
Subjective:    Patient ID: Martin Agee Sr., male    DOB: 1945-06-09, 73 y.o.   MRN: [id number]  HPI telehealth visit today via doxy video visit.  Alternatives to telehealth are presented to this patient, and the patient agrees to the telehealth visit. Pt is advised of the cost of the visit, and agrees to this, also.   Patient is at home, and I am at the office.   Persons attending the telehealth visit: the patient, wife, and I Pt returns for f/u of diabetes mellitus:  DM type: Insulin-requiring type 2.  Dx'ed: 1194 Complications: polyneuropathy, foot ulcer, renal insuff, CAD, and DR.  Therapy: insulin since 2013.  DKA: never.   Severe hypoglycemia: never.  Pancreatitis: never.   Other: he takes multiple daily injections; he declines weight loss surgery; he eats 2-3 meals per day.     Interval history: pt says avg cbg is 214.  He says cbg varies from 181-293.  It is in general higher as the day goes on, but not necessarily so.  He is steadily feeling better.   Past Medical History:  Diagnosis Date  . Diabetes mellitus   . Heart attack ([place]) 10/01/2018   Pt had open heart surgery  . Hypertension   . Tobacco abuse     Past Surgical History:  Procedure Laterality Date  . CORONARY ARTERY BYPASS GRAFT N/A 10/01/2018   Procedure: CORONARY ARTERY BYPASS GRAFTING (CABG) TIMES  FOUR USING LEFT MAMMARY ARTERY AND RIGHT GREATER SAPHEANOUS VEIN HARVESTED ENDOSCOPICALLY;  Surgeon: Melrose Nakayama, MD;  Location: [place];  Service: Open Heart Surgery;  Laterality: N/A;  . HERNIA REPAIR     >10 years ago  . IR RADIOLOGIST EVAL & MGMT  12/24/2016  . IR THORACENTESIS ASP PLEURAL SPACE W/IMG GUIDE  10/06/2018  . LEFT HEART CATH AND CORONARY ANGIOGRAPHY N/A 09/29/2018   Procedure: LEFT HEART CATH AND CORONARY ANGIOGRAPHY;  Surgeon: Troy Sine, MD;  Location: [place] [facility];  Service: Cardiovascular;  Laterality: N/A;  . open heart surfery    . right shoulder surgery     around 2014   . TEE WITHOUT CARDIOVERSION N/A 10/01/2018   Procedure: TRANSESOPHAGEAL ECHOCARDIOGRAM (TEE);  Surgeon: Melrose Nakayama, MD;  Location: [place];  Service: Open Heart Surgery;  Laterality: N/A;    Social History   Socioeconomic History  . Marital status: Married    Spouse name: Not on file  . Number of children: Not on file  . Years of education: Not on file  . Highest education level: Not on file  Occupational History  . Not on file  Social Needs  . Financial resource strain: Not on file  . Food insecurity    Worry: Not on file    Inability: Not on file  . Transportation needs    Medical: Not on file    Non-medical: Not on file  Tobacco Use  . Smoking status: Former Smoker    Quit date: 04/29/2000    Years since quitting: 18.5  . Smokeless tobacco: Never Used  . Tobacco comment: smoked for 10 years on and off  Substance and Sexual Activity  . Alcohol use: Yes    Alcohol/week: 0.0 standard drinks    Comment: 6 martinis a year  . Drug use: No  . Sexual activity: Yes  Lifestyle  . Physical activity    Days per week: Not on file    Minutes per session: Not on file  . Stress:  Not on file  Relationships  . Social Herbalist on phone: Not on file    Gets together: Not on file    Attends religious service: Not on file    Active member of club or organization: Not on file    Attends meetings of clubs or organizations: Not on file    Relationship status: Not on file  . Intimate partner violence    Fear of current or ex partner: Not on file    Emotionally abused: Not on file    Physically abused: Not on file    Forced sexual activity: Not on file  Other Topics Concern  . Not on file  Social History Narrative   Married. 3 kids. 7 grandkids. No greatgrandkids.       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 amiodarone (PACERONE) 200 MG tablet Take 1 tablet (200 mg total) by mouth daily. 90 tablet 3  . amLODipine (NORVASC) 5 MG tablet Take 1 tablet (5 mg total) by mouth daily. 90 tablet 3  . amoxicillin-clavulanate (AUGMENTIN) 875-125 MG tablet Take 1 tablet by mouth every 12 (twelve) hours for 7 days. 14 tablet 0  . aspirin EC 325 MG EC tablet Take 1 tablet (325 mg total) by mouth daily. 30 tablet 0  . BESIVANCE 0.6 % SUSP Place 1 drop into the left eye 3 (three) times daily. (Patient taking differently: Place 1 drop into the left eye as needed. ) 5 mL 0  . brimonidine (ALPHAGAN) 0.2 % ophthalmic solution Place 1 drop into the left eye 3 (three) times daily. 5 mL 0  . dorzolamide (TRUSOPT) 2 % ophthalmic solution Place 1 drop into the left eye 3 (three) times daily. 10 mL 0  . feeding supplement, GLUCERNA SHAKE, (GLUCERNA SHAKE) LIQD Take 237 mLs by mouth 3 (three) times daily between meals.  0  . furosemide (LASIX) 40 MG tablet Take 1 tablet (40 mg total) by mouth daily. 30 tablet 3  . guaiFENesin (MUCINEX) 600 MG 12 hr tablet Take 2 tablets (1,200 mg total) by mouth 2 (two) times daily as needed for cough or to loosen phlegm. 120 tablet 0  . metoprolol tartrate (LOPRESSOR) 25 MG tablet Take 0.5 tablets (12.5 mg total) by mouth 2 (two) times daily. 90 tablet 3  . mirtazapine (REMERON SOL-TAB) 15 MG disintegrating tablet Take 1 tablet (15 mg total) by mouth at bedtime. 30 tablet 0  . ONETOUCH VERIO test strip CHECK LEVELS 4 TIMES DAILY 360 each 3  . Pitavastatin Calcium 1 MG TABS Take 1 tablet (1 mg total) by mouth 2 (two) times a week. 9 tablet 0  . potassium chloride (K-DUR) 10 MEQ tablet Take 1 tablet (10 mEq total) by mouth daily. 30 tablet 3  . tamsulosin (FLOMAX) 0.4 MG CAPS capsule Take 1 capsule (0.4 mg total) by mouth daily. 30 capsule 0   No current facility-administered medications on file prior to visit.     Allergies  Allergen Reactions  . Simvastatin Diarrhea    Family  History  Problem Relation Age of Onset  . Hypertension Mother   . Heart disease Mother        CHF did not see doctor  . Diabetes Maternal Grandmother   . Diabetes Son     Review of Systems He  denies hypoglycemia.      Objective:   Physical Exam  Lab Results  Component Value Date   CREATININE 1.51 (H) 10/18/2018   BUN 33 (H) 10/18/2018   NA 128 (L) 10/18/2018   K 3.9 10/18/2018   CL 91 (L) 10/18/2018   CO2 27 10/18/2018      Assessment & Plan:  Insulin-requiring type 2 DM, with CAD: he needs increased rx.  He only wants to increase to 21 units 3 times a day (just before each meal).  I said OK Renal failure: this is the likely reason why he does not need basal insulin, at least for now.    Patient Instructions  Please stay off the Lantus, and:  Increase the Novolog to 21 units 3 times a day (just before each meal), no matter what your blood sugar is. check your blood sugar twice a day.  vary the time of day when you check, between before the 3 meals, and at bedtime.  also check if you have symptoms of your blood sugar being too high or too low.  please keep a record of the readings and bring it to your next appointment here (or you can bring the meter itself).  You can write it on any piece of paper.  please call us sooner if your blood sugar goes below 70, or if you have a lot of readings over 200.   Please have another visit in 7 days, by video.

## 2018-11-20 ENCOUNTER — Other Ambulatory Visit: Payer: Self-pay | Admitting: Internal Medicine

## 2018-11-20 DIAGNOSIS — M6281 Muscle weakness (generalized): Secondary | ICD-10-CM | POA: Diagnosis not present

## 2018-11-20 DIAGNOSIS — R262 Difficulty in walking, not elsewhere classified: Secondary | ICD-10-CM | POA: Diagnosis not present

## 2018-11-24 ENCOUNTER — Other Ambulatory Visit: Payer: Self-pay | Admitting: Family Medicine

## 2018-11-24 ENCOUNTER — Other Ambulatory Visit: Payer: Self-pay | Admitting: Internal Medicine

## 2018-11-24 ENCOUNTER — Telehealth: Payer: Self-pay | Admitting: Cardiovascular Disease

## 2018-11-24 DIAGNOSIS — R7989 Other specified abnormal findings of blood chemistry: Secondary | ICD-10-CM

## 2018-11-24 DIAGNOSIS — Z5181 Encounter for therapeutic drug level monitoring: Secondary | ICD-10-CM

## 2018-11-24 MED ORDER — AMIODARONE HCL 200 MG PO TABS: 200.0000 mg | ORAL_TABLET | Freq: Every day | ORAL | 3 refills | Status: DC

## 2018-11-24 MED ORDER — AMLODIPINE BESYLATE 5 MG PO TABS: 5.0000 mg | ORAL_TABLET | Freq: Every day | ORAL | 3 refills | Status: DC

## 2018-11-24 MED ORDER — METOPROLOL TARTRATE 25 MG PO TABS: 12.5000 mg | ORAL_TABLET | Freq: Two times a day (BID) | ORAL | 3 refills | Status: DC

## 2018-11-24 NOTE — Telephone Encounter (Signed)
Advised patient and wife needs to get Flomax, Remeron, and Pitavastatin from who currently prescribes. Did have Alena Bills D review and will need HFP in 1 month.

## 2018-11-24 NOTE — Telephone Encounter (Signed)
Please review for refill on Pitavastatin, mirtazpine and tamulosin.

## 2018-11-24 NOTE — Telephone Encounter (Signed)
Refill sent for amiodarone, amlodipine and metoprolol

## 2018-11-24 NOTE — Telephone Encounter (Signed)
New Message    *STAT* If patient is at the pharmacy, call can be transferred to refill team.   1. Which medications need to be refilled? (please list name of each medication and dose if known) amiodarone (PACERONE) 200 MG tablet  amLODipine (NORVASC) 5 MG tablet  metoprolol tartrate (LOPRESSOR) 25 MG tablet mirtazapine (REMERON SOL-TAB) 15 MG disintegrating tablet Pitavastatin Calcium 1 MG TABS tamsulosin (FLOMAX) 0.4 MG CAPS capsule  2. Which pharmacy/location (including street and city if local pharmacy) is medication to be sent to? [id number] - [place], [place] - [address] AT [address]  3. Do they need a 30 day or 90 day supply? 90 day supply

## 2018-11-26 ENCOUNTER — Other Ambulatory Visit: Payer: Self-pay

## 2018-11-26 ENCOUNTER — Ambulatory Visit (INDEPENDENT_AMBULATORY_CARE_PROVIDER_SITE_OTHER): Payer: PPO | Admitting: Endocrinology

## 2018-11-26 ENCOUNTER — Encounter: Payer: Self-pay | Admitting: Endocrinology

## 2018-11-26 VITALS — Ht 72.0 in | Wt 258.0 lb

## 2018-11-26 DIAGNOSIS — IMO0002 Reserved for concepts with insufficient information to code with codable children: Secondary | ICD-10-CM

## 2018-11-26 DIAGNOSIS — E1139 Type 2 diabetes mellitus with other diabetic ophthalmic complication: Secondary | ICD-10-CM

## 2018-11-26 DIAGNOSIS — E1165 Type 2 diabetes mellitus with hyperglycemia: Secondary | ICD-10-CM

## 2018-11-26 MED ORDER — NOVOLOG FLEXPEN 100 UNIT/ML ~~LOC~~ SOPN: 25.0000 [IU] | PEN_INJECTOR | Freq: Three times a day (TID) | SUBCUTANEOUS | 0 refills | Status: DC

## 2018-11-26 NOTE — Patient Instructions (Addendum)
Please stay off the Lantus, and:  Increase the Novolog to 25 units 3 times a day (just before each meal), no matter what your blood sugar is. check your blood sugar twice a day.  vary the time of day when you check, between before the 3 meals, and at bedtime.  also check if you have symptoms of your blood sugar being too high or too low.  please keep a record of the readings and bring it to your next appointment here (or you can bring the meter itself).  You can write it on any piece of paper.  please call us sooner if your blood sugar goes below 70, or if you have a lot of readings over 200.   Please have another video visit in 3 weeks.

## 2018-11-26 NOTE — Progress Notes (Signed)
Subjective:    Patient ID: Martin Agee Sr., male    DOB: 1945/11/29, 73 y.o.   MRN: [id number]  HPI telehealth visit today via doxy video visit.  Alternatives to telehealth are presented to this patient, and the patient agrees to the telehealth visit. Pt is advised of the cost of the visit, and agrees to this, also.   Patient is at home, and I am at the office.   Persons attending the telehealth visit: the patient, wife, and I Pt returns for f/u of diabetes mellitus:  DM type: Insulin-requiring type 2.  Dx'ed: 2585 Complications: polyneuropathy, foot ulcer, renal insuff, PAD, CAD, and DR.  Therapy: insulin since 2013.  DKA: never.   Severe hypoglycemia: never.  Pancreatitis: never.   Other: he takes multiple daily injections; he declines weight loss surgery; he eats 2-3 meals per day.     Interval history: Pt says cbg varies from 125-227.  It is in general higher as the day goes on.  He is feeling steadily better.  He says he never misses the insulin.   Past Medical History:  Diagnosis Date  . Diabetes mellitus   . Heart attack ([place]) 10/01/2018   Pt had open heart surgery  . Hypertension   . Tobacco abuse     Past Surgical History:  Procedure Laterality Date  . CORONARY ARTERY BYPASS GRAFT N/A 10/01/2018   Procedure: CORONARY ARTERY BYPASS GRAFTING (CABG) TIMES  FOUR USING LEFT MAMMARY ARTERY AND RIGHT GREATER SAPHEANOUS VEIN HARVESTED ENDOSCOPICALLY;  Surgeon: Melrose Nakayama, MD;  Location: [facility];  Service: Open Heart Surgery;  Laterality: N/A;  . HERNIA REPAIR     >10 years ago  . IR RADIOLOGIST EVAL & MGMT  12/24/2016  . IR THORACENTESIS ASP PLEURAL SPACE W/IMG GUIDE  10/06/2018  . LEFT HEART CATH AND CORONARY ANGIOGRAPHY N/A 09/29/2018   Procedure: LEFT HEART CATH AND CORONARY ANGIOGRAPHY;  Surgeon: Troy Sine, MD;  Location: [facility];  Service: Cardiovascular;  Laterality: N/A;  . open heart surfery    . right shoulder surgery     around 2014  . TEE  WITHOUT CARDIOVERSION N/A 10/01/2018   Procedure: TRANSESOPHAGEAL ECHOCARDIOGRAM (TEE);  Surgeon: Melrose Nakayama, MD;  Location: [facility];  Service: Open Heart Surgery;  Laterality: N/A;    Social History   Socioeconomic History  . Marital status: Married    Spouse name: Not on file  . Number of children: Not on file  . Years of education: Not on file  . Highest education level: Not on file  Occupational History  . Not on file  Social Needs  . Financial resource strain: Not on file  . Food insecurity    Worry: Not on file    Inability: Not on file  . Transportation needs    Medical: Not on file    Non-medical: Not on file  Tobacco Use  . Smoking status: Former Smoker    Quit date: 04/29/2000    Years since quitting: 18.5  . Smokeless tobacco: Never Used  . Tobacco comment: smoked for 10 years on and off  Substance and Sexual Activity  . Alcohol use: Yes    Alcohol/week: 0.0 standard drinks    Comment: 6 martinis a year  . Drug use: No  . Sexual activity: Yes  Lifestyle  . Physical activity    Days per week: Not on file    Minutes per session: Not on file  . Stress: Not on  file  Relationships  . Social Herbalist on phone: Not on file    Gets together: Not on file    Attends religious service: Not on file    Active member of club or organization: Not on file    Attends meetings of clubs or organizations: Not on file    Relationship status: Not on file  . Intimate partner violence    Fear of current or ex partner: Not on file    Emotionally abused: Not on file    Physically abused: Not on file    Forced sexual activity: Not on file  Other Topics Concern  . Not on file  Social History Narrative   Married. 3 kids. 7 grandkids. No greatgrandkids.       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 amiodarone (PACERONE) 200 MG tablet Take 1 tablet (200 mg total) by mouth daily. 90 tablet 3  . amLODipine (NORVASC) 5 MG tablet Take 1 tablet (5 mg total) by mouth daily. 90 tablet 3  . aspirin EC 325 MG EC tablet Take 1 tablet (325 mg total) by mouth daily. 30 tablet 0  . brimonidine (ALPHAGAN) 0.2 % ophthalmic solution Place 1 drop into the left eye 3 (three) times daily. 5 mL 0  . dorzolamide (TRUSOPT) 2 % ophthalmic solution Place 1 drop into the left eye 3 (three) times daily. 10 mL 0  . furosemide (LASIX) 40 MG tablet Take 1 tablet (40 mg total) by mouth daily. 30 tablet 3  . guaiFENesin (MUCINEX) 600 MG 12 hr tablet Take 2 tablets (1,200 mg total) by mouth 2 (two) times daily as needed for cough or to loosen phlegm. 120 tablet 0  . metoprolol tartrate (LOPRESSOR) 25 MG tablet Take 0.5 tablets (12.5 mg total) by mouth 2 (two) times daily. 90 tablet 3  . mirtazapine (REMERON SOL-TAB) 15 MG disintegrating tablet Take 1 tablet (15 mg total) by mouth at bedtime. 30 tablet 0  . ONETOUCH VERIO test strip CHECK LEVELS 4 TIMES DAILY 360 each 3  . Pitavastatin Calcium 1 MG TABS Take 1 tablet (1 mg total) by mouth 2 (two) times a week. 9 tablet 0  . potassium chloride (K-DUR) 10 MEQ tablet Take 1 tablet (10 mEq total) by mouth daily. 30 tablet 3  . tamsulosin (FLOMAX) 0.4 MG CAPS capsule Take 1 capsule (0.4 mg total) by mouth daily. 30 capsule 0  . BESIVANCE 0.6 % SUSP Place 1 drop into the left eye 3 (three) times daily. (Patient not taking: Reported on 11/26/2018) 5 mL 0  . feeding supplement, GLUCERNA SHAKE, (GLUCERNA SHAKE) LIQD Take 237 mLs by mouth 3 (three) times daily between meals. (Patient not taking: Reported on 11/26/2018)  0   No current facility-administered medications on file prior to visit.     Allergies  Allergen Reactions  . Simvastatin Diarrhea    Family History  Problem Relation Age of Onset  . Hypertension Mother   . Heart disease Mother        CHF did not see doctor   . Diabetes Maternal Grandmother   . Diabetes Son     Ht 6' (1.829 m)   Wt 258 lb (117 kg) Comment: verbalized  BMI 34.99 kg/m    Review of Systems He denies hypoglycemia.  Objective:   Physical Exam  Lab Results  Component Value Date   CREATININE 1.51 (H) 10/18/2018   BUN 33 (H) 10/18/2018   NA 128 (L) 10/18/2018   K 3.9 10/18/2018   CL 91 (L) 10/18/2018   CO2 27 10/18/2018      Assessment & Plan:  Insulin-requiring type 2 DM, with PAD: he needs increased rx Renal insuff: this is the likely reason why he does not need basal insulin.  However, he will need to resume at some point. CAD: in this setting, he needs to avoid hypoglycemia.  Patient Instructions  Please stay off the Lantus, and:  Increase the Novolog to 25 units 3 times a day (just before each meal), no matter what your blood sugar is. check your blood sugar twice a day.  vary the time of day when you check, between before the 3 meals, and at bedtime.  also check if you have symptoms of your blood sugar being too high or too low.  please keep a record of the readings and bring it to your next appointment here (or you can bring the meter itself).  You can write it on any piece of paper.  please call us sooner if your blood sugar goes below 70, or if you have a lot of readings over 200.   Please have another video visit in 3 weeks.

## 2018-11-27 ENCOUNTER — Other Ambulatory Visit: Payer: Self-pay | Admitting: Internal Medicine

## 2018-12-08 ENCOUNTER — Ambulatory Visit: Payer: PPO | Admitting: Thoracic Surgery (Cardiothoracic Vascular Surgery)

## 2018-12-09 ENCOUNTER — Ambulatory Visit: Payer: PPO | Admitting: Thoracic Surgery (Cardiothoracic Vascular Surgery)

## 2018-12-10 ENCOUNTER — Telehealth: Payer: Self-pay | Admitting: Cardiovascular Disease

## 2018-12-10 NOTE — Telephone Encounter (Signed)
New Message   Pt c/o swelling: STAT is pt has developed SOB within 24 hours  1) How much weight have you gained and in what time span? Unknown, patient does not weigh himself daily. But his wife says it appears that he is losing weight because he does not have an appetitie  2) If swelling, where is the swelling located? Feet and ankles  3) Are you currently taking a fluid pill? yes  4) Are you currently SOB? No   5) Do you have a log of your daily weights (if so, list)? no  6) Have you gained 3 pounds in a day or 5 pounds in a week? No   7) Have you traveled recently? No

## 2018-12-10 NOTE — Telephone Encounter (Signed)
1. Please increase furosemide to 40mg  twice daily (AM and noon) for 2 days wight daily  2. Keep feet elevated as much as possible for next 2 days.  3. Weight daily and keep records  4. Continue low sodium diet and avoid take out of canned products as much as possible.  5. Schedule follow up with APP for further assessment if needed.

## 2018-12-10 NOTE — Telephone Encounter (Signed)
Called and advised of message.  Patient verbalized understanding.

## 2018-12-10 NOTE — Telephone Encounter (Signed)
Patient called stated that the swelling has been noticed for the past few weeks, over night they stated they monitored it and it seems to be worse and they thought they should call. Patient denies SOB, or weight gain (they actually say he has lost weight) only say it is in his legs.  Patient denies increased sodium or diet changes at this time.  He states he does have a very mild pain the middle of his chest, but he would not consider it to be chest pain. He denies no redness to legs. They would want to know if his swelling could be causing the swelling, and if they should increase the lasix. I advised I would route to PharmD to advise since Dr.Kelly is out today.  BP was 114/70 HR 82 this morning as well as the other two below. 120/80 111/68

## 2018-12-21 DIAGNOSIS — R262 Difficulty in walking, not elsewhere classified: Secondary | ICD-10-CM | POA: Diagnosis not present

## 2018-12-21 DIAGNOSIS — M6281 Muscle weakness (generalized): Secondary | ICD-10-CM | POA: Diagnosis not present

## 2018-12-29 ENCOUNTER — Other Ambulatory Visit: Payer: Self-pay | Admitting: Thoracic Surgery (Cardiothoracic Vascular Surgery)

## 2018-12-29 ENCOUNTER — Other Ambulatory Visit: Payer: Self-pay | Admitting: *Deleted

## 2018-12-29 ENCOUNTER — Ambulatory Visit (INDEPENDENT_AMBULATORY_CARE_PROVIDER_SITE_OTHER): Payer: Self-pay | Admitting: Thoracic Surgery (Cardiothoracic Vascular Surgery)

## 2018-12-29 ENCOUNTER — Ambulatory Visit
Admission: RE | Admit: 2018-12-29 | Discharge: 2018-12-29 | Disposition: A | Discharge status: Home / Self Care | Payer: PPO | Source: Ambulatory Visit | Attending: Thoracic Surgery (Cardiothoracic Vascular Surgery) | Admitting: Thoracic Surgery (Cardiothoracic Vascular Surgery)

## 2018-12-29 ENCOUNTER — Other Ambulatory Visit: Payer: Self-pay

## 2018-12-29 ENCOUNTER — Encounter: Payer: Self-pay | Admitting: Thoracic Surgery (Cardiothoracic Vascular Surgery)

## 2018-12-29 VITALS — BP 136/74 | HR 79 | Temp 97.3°F | Resp 16 | Ht 72.0 in | Wt 241.0 lb

## 2018-12-29 DIAGNOSIS — J90 Pleural effusion, not elsewhere classified: Secondary | ICD-10-CM

## 2018-12-29 DIAGNOSIS — Z951 Presence of aortocoronary bypass graft: Secondary | ICD-10-CM

## 2018-12-29 MED ORDER — FUROSEMIDE 40 MG PO TABS: 40.0000 mg | ORAL_TABLET | Freq: Two times a day (BID) | ORAL | 3 refills | Status: DC

## 2018-12-29 NOTE — Progress Notes (Signed)
[address]       [place] [address]             [id number]     HPI: Mr. Chau returns for scheduled follow-up visit  Adein Tenold is a 73 year old man with a history of hypertension, hyperlipidemia, insulin-dependent type 2 diabetes, diabetic retinopathy and neuropathy, and obesity.  He presented back in June with ST elevation MI.  At catheterization he had severe three-vessel disease.  I did coronary bypass grafting x4 on 10/01/2018.  His postoperative course was complicated.  He had profound weakness, orthostatic hypotension, anemia, atrial fibrillation, acute kidney injury, severe deconditioning, and depression.  He was discharged to a skilled nursing facility on 10/19/2018.  He now is back at home.  Overall he feels better than he did when I last saw him in July.  He is able to get himself up.  His energy levels have improved.  He still has a poor appetite.  He has a frequent cough.  He is occasionally aware of some movement in his sternum with coughing, that is not associated with pain.  Past Medical History:  Diagnosis Date  . Diabetes mellitus   . Heart attack ([place]) 10/01/2018   Pt had open heart surgery  . Hypertension   . Tobacco abuse     Current Outpatient Medications  Medication Sig Dispense Refill  . amLODipine (NORVASC) 5 MG tablet Take 1 tablet (5 mg total) by mouth daily. 90 tablet 3  . aspirin EC 325 MG EC tablet Take 1 tablet (325 mg total) by mouth daily. 30 tablet 0  . brimonidine (ALPHAGAN) 0.2 % ophthalmic solution Place 1 drop into the left eye 3 (three) times daily. 5 mL 0  . dorzolamide (TRUSOPT) 2 % ophthalmic solution Place 1 drop into the left eye 3 (three) times daily. 10 mL 0  . furosemide (LASIX) 40 MG tablet Take 1 tablet (40 mg total) by mouth 2 (two) times daily. 30 tablet 3  . insulin aspart (NOVOLOG FLEXPEN) 100 UNIT/ML FlexPen Inject 25 Units into the skin 3 (three) times daily with meals. 5 pen 0  . metoprolol tartrate  (LOPRESSOR) 25 MG tablet Take 25 mg by mouth 2 (two) times daily.    Glory Rosebush VERIO test strip CHECK LEVELS 4 TIMES DAILY 360 each 3  . Pitavastatin Calcium 1 MG TABS Take 1 tablet (1 mg total) by mouth 2 (two) times a week. 9 tablet 0  . potassium chloride (K-DUR) 10 MEQ tablet Take 1 tablet (10 mEq total) by mouth daily. 30 tablet 3  . BESIVANCE 0.6 % SUSP Place 1 drop into the left eye 3 (three) times daily. (Patient not taking: Reported on 11/26/2018) 5 mL 0  . feeding supplement, GLUCERNA SHAKE, (GLUCERNA SHAKE) LIQD Take 237 mLs by mouth 3 (three) times daily between meals. (Patient not taking: Reported on 11/26/2018)  0   No current facility-administered medications for this visit.     Physical Exam BP 136/74 (BP Location: Left Arm, Patient Position: Sitting, Cuff Size: Normal)   Pulse 79   Temp (!) 97.3 F (36.3 C)   Resp 16   Ht 6' (1.829 m)   Wt 241 lb (109.3 kg)   SpO2 92% Comment: RA  BMI 32.85 kg/m  73 year old man in no acute distress Alert and oriented x3 with no deficit Lungs diminished at right base, otherwise clear Cardiac regular rate and rhythm, no murmur Sternum movement with cough, skin incision well-healed  3+ edema both ankles  Diagnostic Tests: CHEST - 2 VIEW  COMPARISON:  Radiographs 11/17/2018 and 10/10/2018.  FINDINGS: The heart size and mediastinal contours are stable status post median sternotomy and CABG. The left pleural effusion has resolved. There is a residual small right pleural effusion versus pleural thickening with mild right basilar atelectasis/scarring. The left lung is clear. There is no pneumothorax. The bones appear unchanged.  IMPRESSION: Interval improved aeration of the lung bases with resolution of left pleural effusion. Residual small right pleural effusion versus pleural thickening with adjacent right basilar atelectasis or scarring.   Electronically Signed   By: Richardean Sale M.D.   On: 12/29/2018 15:23 I  personally reviewed the chest x-ray images and concur with the findings noted above  Impression: Copelin Cheuk is a 73 year old manwith a past medical history of hypertension, hyperlipidemia, insulin-dependent type 2 diabetes, diabetic retinopathy and neuropathy, and obesity.  He presented with an ST elevation MI and was found to have three-vessel coronary disease.  He underwent coronary artery bypass grafting x4 on 10/01/2018.  CAD-status post STEMI, status post CABG.  No anginal symptoms.  Congestive heart failure-normal EF by echo, but diastolic dysfunction.  He is volume overloaded with significant edema in his lower legs bilaterally.  Currently on Lasix 40 mg daily.  Will increase to 40 mg twice daily.  Poor appetite-possibly could be medication related.  Amiodarone usually causes nausea more so than appetite suppression.  He would like to discontinue that medication.  He understands there is a risk for back into atrial fibrillation.  I will discontinue his amiodarone.  He no longer needs to be on Mucinex.  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  There is no evidence of infection.  If this were to bother him we could discuss sternal plating.  Pleural effusions-his left pleural effusion has completely resolved.  The diaphragm is seen clearly on that side now.  He still has some fluid on the right side.  I think would be worthwhile to do a right thoracentesis if there is enough fluid to drain.  He requests Dr. Kathlene Cote.  Overall I think he has made some significant progress since his last visit.  He does still have a way to go.  Plan:  DC amiodarone DC guaifenesin Increase Lasix to 40 mg twice daily Ultrasound-guided right thoracentesis Return in 1 month with PA and lateral chest x-ray  Melrose Nakayama, MD [facility] [phone]

## 2018-12-30 ENCOUNTER — Other Ambulatory Visit (HOSPITAL_COMMUNITY)
Admission: RE | Admit: 2018-12-30 | Discharge: 2018-12-30 | Disposition: A | Discharge status: Home / Self Care | Payer: PPO | Source: Ambulatory Visit | Attending: Thoracic Surgery (Cardiothoracic Vascular Surgery) | Admitting: Thoracic Surgery (Cardiothoracic Vascular Surgery)

## 2018-12-30 DIAGNOSIS — J90 Pleural effusion, not elsewhere classified: Secondary | ICD-10-CM

## 2018-12-30 DIAGNOSIS — Z20828 Contact with and (suspected) exposure to other viral communicable diseases: Secondary | ICD-10-CM | POA: Diagnosis not present

## 2018-12-30 DIAGNOSIS — Z951 Presence of aortocoronary bypass graft: Secondary | ICD-10-CM | POA: Diagnosis not present

## 2018-12-30 DIAGNOSIS — Z8709 Personal history of other diseases of the respiratory system: Secondary | ICD-10-CM | POA: Diagnosis not present

## 2018-12-30 LAB — SARS CORONAVIRUS 2 (TAT 6-24 HRS): SARS Coronavirus 2: NEGATIVE

## 2019-01-01 ENCOUNTER — Ambulatory Visit (HOSPITAL_COMMUNITY)
Admission: RE | Admit: 2019-01-01 | Discharge: 2019-01-01 | Disposition: A | Discharge status: Home / Self Care | Payer: PPO | Source: Ambulatory Visit | Attending: Thoracic Surgery (Cardiothoracic Vascular Surgery) | Admitting: Thoracic Surgery (Cardiothoracic Vascular Surgery)

## 2019-01-01 ENCOUNTER — Other Ambulatory Visit: Payer: Self-pay | Admitting: Thoracic Surgery (Cardiothoracic Vascular Surgery)

## 2019-01-01 ENCOUNTER — Other Ambulatory Visit: Payer: Self-pay

## 2019-01-01 DIAGNOSIS — J90 Pleural effusion, not elsewhere classified: Secondary | ICD-10-CM

## 2019-01-01 DIAGNOSIS — Z951 Presence of aortocoronary bypass graft: Secondary | ICD-10-CM | POA: Diagnosis not present

## 2019-01-01 DIAGNOSIS — Z8709 Personal history of other diseases of the respiratory system: Secondary | ICD-10-CM | POA: Insufficient documentation

## 2019-01-01 DIAGNOSIS — Z20828 Contact with and (suspected) exposure to other viral communicable diseases: Secondary | ICD-10-CM | POA: Insufficient documentation

## 2019-01-01 MED ORDER — LIDOCAINE HCL 1 % IJ SOLN
INTRAMUSCULAR | Status: AC
  Filled 2019-01-01: qty 20

## 2019-01-06 ENCOUNTER — Ambulatory Visit (INDEPENDENT_AMBULATORY_CARE_PROVIDER_SITE_OTHER): Payer: PPO | Admitting: Family Medicine

## 2019-01-06 ENCOUNTER — Other Ambulatory Visit: Payer: Self-pay

## 2019-01-06 ENCOUNTER — Encounter: Payer: Self-pay | Admitting: Family Medicine

## 2019-01-06 VITALS — BP 110/68 | HR 82 | Temp 97.3°F | Ht 72.0 in | Wt 241.4 lb

## 2019-01-06 DIAGNOSIS — E1169 Type 2 diabetes mellitus with other specified complication: Secondary | ICD-10-CM | POA: Diagnosis not present

## 2019-01-06 DIAGNOSIS — J301 Allergic rhinitis due to pollen: Secondary | ICD-10-CM

## 2019-01-06 DIAGNOSIS — E785 Hyperlipidemia, unspecified: Secondary | ICD-10-CM

## 2019-01-06 DIAGNOSIS — E1165 Type 2 diabetes mellitus with hyperglycemia: Secondary | ICD-10-CM | POA: Diagnosis not present

## 2019-01-06 DIAGNOSIS — E1159 Type 2 diabetes mellitus with other circulatory complications: Secondary | ICD-10-CM | POA: Diagnosis not present

## 2019-01-06 DIAGNOSIS — E1139 Type 2 diabetes mellitus with other diabetic ophthalmic complication: Secondary | ICD-10-CM | POA: Diagnosis not present

## 2019-01-06 DIAGNOSIS — IMO0002 Reserved for concepts with insufficient information to code with codable children: Secondary | ICD-10-CM

## 2019-01-06 DIAGNOSIS — I152 Hypertension secondary to endocrine disorders: Secondary | ICD-10-CM

## 2019-01-06 DIAGNOSIS — Z8659 Personal history of other mental and behavioral disorders: Secondary | ICD-10-CM | POA: Diagnosis not present

## 2019-01-06 DIAGNOSIS — Z23 Encounter for immunization: Secondary | ICD-10-CM

## 2019-01-06 DIAGNOSIS — E669 Obesity, unspecified: Secondary | ICD-10-CM | POA: Diagnosis not present

## 2019-01-06 DIAGNOSIS — I10 Essential (primary) hypertension: Secondary | ICD-10-CM | POA: Diagnosis not present

## 2019-01-06 LAB — COMPREHENSIVE METABOLIC PANEL
ALT: 6 U/L (ref 0–53)
AST: 10 U/L (ref 0–37)
Albumin: 3.8 g/dL (ref 3.5–5.2)
Alkaline Phosphatase: 52 U/L (ref 39–117)
BUN: 11 mg/dL (ref 6–23)
CO2: 30 mEq/L (ref 19–32)
Calcium: 9.1 mg/dL (ref 8.4–10.5)
Chloride: 97 mEq/L (ref 96–112)
Creatinine, Ser: 1.09 mg/dL (ref 0.40–1.50)
GFR: 66.05 mL/min (ref >60.00)
Glucose, Bld: 181 mg/dL — ABNORMAL HIGH (ref 70–99)
Potassium: 4.3 mEq/L (ref 3.5–5.1)
Sodium: 138 mEq/L (ref 135–145)
Total Bilirubin: 1 mg/dL (ref 0.2–1.2)
Total Protein: 7.3 g/dL (ref 6.0–8.3)

## 2019-01-06 LAB — HEMOGLOBIN A1C: Hgb A1c MFr Bld: 8.2 % — ABNORMAL HIGH (ref 4.6–6.5)

## 2019-01-06 LAB — CBC
HCT: 37.5 % — ABNORMAL LOW (ref 39.0–52.0)
Hemoglobin: 12 g/dL — ABNORMAL LOW (ref 13.0–17.0)
MCHC: 32 g/dL (ref 30.0–36.0)
MCV: 82.7 fl (ref 78.0–100.0)
Platelets: 320 10*3/uL (ref 150.0–400.0)
RBC: 4.53 Mil/uL (ref 4.22–5.81)
RDW: 16.1 % — ABNORMAL HIGH (ref 11.5–15.5)
WBC: 8.3 10*3/uL (ref 4.0–10.5)

## 2019-01-06 LAB — MICROALBUMIN / CREATININE URINE RATIO
Creatinine,U: 245 mg/dL
Microalb Creat Ratio: 8.2 mg/g (ref 0.0–30.0)
Microalb, Ur: 20.1 mg/dL — ABNORMAL HIGH (ref 0.0–1.9)

## 2019-01-06 MED ORDER — FLUTICASONE PROPIONATE 50 MCG/ACT NA SUSP: 2.0000 | Freq: Every day | NASAL | 3 refills | Status: DC

## 2019-01-06 NOTE — Patient Instructions (Addendum)
Health Maintenance Due  Topic Date Due  . URINE MICROALBUMIN - today with labs 08/07/2016  . INFLUENZA VACCINE - today 11/28/2018   Talk to Dr. Claiborne [name] next month about injectable cholesterol medications  sounds like with lingering cough issues/post nasal drip and prior improvement on nasal steroids that this could be allergic rhinitis/allergies. Advised trial of nasacort which he has at home until runs out- or a minimum of 3 weeks. Can try flonase after that and use intermittently in the future if needed for recurrence.    Please stop by lab before you go If you do not have mychart- we will call you about results within 5 business days of [place] receiving them.  If you have mychart- we will send your results within 3 business days of [place] receiving them.  If abnormal or we want to clarify a result, we will call or mychart you to make sure you receive the message.  If you have questions or concerns or don't hear within 5-7 days, please send [place] a message or call us.

## 2019-01-06 NOTE — Assessment & Plan Note (Signed)
S:for past year has had intermittent mucus issues sensation in his chest. Has had multiple x-rays without pneumonia in that time frame.  Quit smoking 2002- off and on for 4 years. Oxygen levels have been normal.   Went to Dr. Lucia Gaskins last year and was placed on nasal spray for his ears and antibiotic cefuroxime and symptoms cleared but then symptoms came back. Clears up intermittently but then comes back. Was on some antibiotics in hospital and seemed ot improve as well. Seems to be better now but comes and goes.  A/P: sounds like with lingering cough issues/post nasal drip and prior improvement on nasal steroids that this could be allergic rhinitis/allergies. Advised trial of nasacort which he has at home until runs out- or a minimum of 3 weeks. Can try flonase after that and use intermittently in the future if needed for recurrence.

## 2019-01-06 NOTE — Progress Notes (Signed)
Phone [phone]   Subjective:  Martin CHARLEBOIS Sr. is a 73 y.o. year old very pleasant male patient who presents for/with See problem oriented charting Chief Complaint  Patient presents with  . Follow-up    Not fasting, cranberry grape juice  . Diabetes  . Depression  . Hypertension  . Hyperlipidemia  . Obesity   ROS-  Denies HA, dizziness, CP, SOB, visual changes. Has had weight loss    Past Medical History-  Patient Active Problem List   Diagnosis Date Noted  . Postoperative atrial fibrillation ([place]) 10/24/2018    Priority: High  . 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 Undiagnosed cardiac murmurs 08/15/2015    Priority: High  . Morbid obesity ([place]) 04/12/2014    Priority: High  . DM (diabetes mellitus) type II uncontrolled with eye manifestation ([place]) 02/03/2007    Priority: High  . PAD (peripheral artery disease) ([place]) 08/20/2017    Priority: Medium  . Partial tear of right Achilles tendon 02/12/2017    Priority: Medium  . Aortic atherosclerosis ([place]) 10/07/2016    Priority: Medium  . History of skin cancer 08/15/2015    Priority: Medium  . Diabetic retinopathy ([place]) 02/21/2015    Priority: Medium  . Hyperlipidemia associated with type 2 diabetes mellitus ([place]) 08/24/2014    Priority: Medium  . Diabetic polyneuropathy ([place]) 04/11/2009    Priority: Medium  . Hypertension associated with diabetes ([place]) 02/03/2007    Priority: Medium  . Acute blood loss as cause of postoperative anemia 10/24/2018    Priority: Low  . BPH (benign prostatic hyperplasia) 10/24/2018    Priority: Low  . History of depression 10/24/2018    Priority: Low  . S/P CABG x 4 10/01/2018    Priority: Low  . Onychomycosis of toenail 02/17/2018    Priority: Low  . Allergic rhinitis 08/20/2017    Priority: Low  . C. difficile colitis 09/25/2016    Priority: Low  . History of aspiration pneumonia 09/25/2016    Priority: Low  . Former smoker  08/24/2014    Priority: Low  . Ingrowing toenail 07/19/2014    Priority: Low  . BACK PAIN, LUMBAR, WITH RADICULOPATHY 08/30/2008    Priority: Low  . Acute kidney injury ([place]) 09/18/2016    Medications- reviewed and updated Current Outpatient Medications  Medication Sig Dispense Refill  . amLODipine (NORVASC) 5 MG tablet Take 1 tablet (5 mg total) by mouth daily. 90 tablet 3  . aspirin EC 325 MG EC tablet Take 1 tablet (325 mg total) by mouth daily. 30 tablet 0  . BESIVANCE 0.6 % SUSP Place 1 drop into the left eye 3 (three) times daily. 5 mL 0  . brimonidine (ALPHAGAN) 0.2 % ophthalmic solution Place 1 drop into the left eye 3 (three) times daily. 5 mL 0  . dorzolamide (TRUSOPT) 2 % ophthalmic solution Place 1 drop into the left eye 3 (three) times daily. 10 mL 0  . feeding supplement, GLUCERNA SHAKE, (GLUCERNA SHAKE) LIQD Take 237 mLs by mouth 3 (three) times daily between meals.  0  . furosemide (LASIX) 40 MG tablet Take 1 tablet (40 mg total) by mouth 2 (two) times daily. 30 tablet 3  . insulin aspart (NOVOLOG FLEXPEN) 100 UNIT/ML FlexPen Inject 25 Units into the skin 3 (three) times daily with meals. 5 pen 0  . metoprolol tartrate (LOPRESSOR) 25 MG tablet Take 12.5 mg by mouth 2 (two) times daily.     Marland Kitchen  ONETOUCH VERIO test strip CHECK LEVELS 4 TIMES DAILY 360 each 3  . Pitavastatin Calcium 1 MG TABS Take 1 tablet (1 mg total) by mouth 2 (two) times a week. 9 tablet 0  . potassium chloride (K-DUR) 10 MEQ tablet Take 1 tablet (10 mEq total) by mouth daily. 30 tablet 3  . fluticasone (FLONASE) 50 MCG/ACT nasal spray Place 2 sprays into both nostrils daily. 16 g 3   No current facility-administered medications for this visit.      Objective:  BP 110/68 (BP Location: Left Arm, Patient Position: Sitting, Cuff Size: Normal)   Pulse 82   Temp (!) 97.3 F (36.3 C) (Temporal)   Ht 6' (1.829 m)   Wt 241 lb 6.4 oz (109.5 kg)   SpO2 95%   BMI 32.74 kg/m  Gen: NAD, resting comfortably  CV: RRR no murmurs rubs or gallops Lungs: CTAB no crackles, wheeze, rhonchi. Slight decrease in breath sounds right lung base- patient was told had elevated diaphragm on [place] last week done for concern of effusions Abdomen: soft/nontender/nondistended/normal bowel sounds. No rebound or guarding.  Ext: 1+ edema (on lasix per Dr. Roxan Hockey for fluid retention- amlodipine likely contributes) Skin: warm, dry Neuro: walks with walker    Assessment and Plan   # Diabetes S: poorly controlled on Novolog 25 units 2-3 x daily on last check. Sees Dr. Loanne Drilling CBGs- Checking BG three times daily. Fasting BG around 150's. No hypoglycemic episodes since June.  Exercise and diet- Eats on occasionally. Limiting carb and sugar intake. No structured exercise. Has been walking more around the house. Before having heart attack, he was walking his 5 acre property daily.  Lab Results  Component Value Date   HGBA1C 8.1 (H) 09/29/2018   HGBA1C 7.7 (A) 05/20/2018   HGBA1C 8.0 (A) 02/17/2018   A/P: has had significant weight loss- luckily no lows. Will have him update a1c today and then set up virtual visit with Dr. Loanne Drilling  #hypertension S: controlled on Furosemide  40 mg BID, Amlodipine 5 mg daily, and Metoprolol 25 mg 1/2 tablet BID. Also taking K-Dur 10 MEQ. Limiting salt intake. Checking daily at home, staying consistently below 140/80. BP Readings from Last 3 Encounters:  01/06/19 110/68  12/29/18 136/74  11/17/18 134/75  A/P: Stable. Continue current medications.   #hyperlipidemia S: poorly controlled on Pitavastatin 1 mg twice weekly, tolerating well, myalgias on higher dose Lab Results  Component Value Date   CHOL 205 (H) 09/29/2018   HDL 27 (L) 09/29/2018   LDLCALC 139 (H) 09/29/2018   LDLDIRECT 173.0 07/01/2018   TRIG 195 (H) 09/29/2018   CHOLHDL 7.6 09/29/2018   A/P: patient on max tolerable dose- continue current medication - encouraged him to talk to cardiology clinic about pcsk9 inhbitor   #History of situational depression S: In June 2020-CABG/hospitalization and eventual nursing home rehab was very hard on patient-had been on Remeron for a period.  History of depression was listed June 2020.  He is also on Xanax for some anxiety but that was stopped does not sound very helpful Depression screen [facility] 2/9 07/01/2018  Decreased Interest 0  Down, Depressed, Hopeless 0  PHQ - 2 Score 0  Some recent data might be hidden  A/P: This was situational related to CABG/pandemic- not being able to see his family. PHQ9 of 0 on 01/06/2019  # Obesity  S:Patient has lost significant weight down from 277  (he states was up to 293 at peak) peak to 241 today since early  July. States since cabg  Appetite has just been lower. Had been on remeron in hospital   Amiodarone seemed to suppress appetite. Also stopped mucinex. Was eating about 1/3 of what he would usually eat- starting to improve off the above medicines Wt Readings from Last 3 Encounters:  01/06/19 241 lb 6.4 oz (109.5 kg)  12/29/18 241 lb (109.3 kg)  11/26/18 258 lb (117 kg)  A/P: weight loss in general would be good but do not love how this was related to low appetite- glad appetite is starting to improve- will continue to monitor. Weight has finally stabilized at home- will not focus on weight loss at this point- want him to try to stay stable at least for a month or two given over 50 lbs weight loss   # allergic rhinitis S:for past year has had intermittent mucus issues sensation in his chest. Has had multiple x-rays without pneumonia in that time frame.  Quit smoking 2002- off and on for 4 years. Oxygen levels have been normal.   Went to Dr. Lucia Gaskins last year and was placed on nasal spray for his ears and antibiotic cefuroxime and symptoms cleared but then symptoms came back. Clears up intermittently but then comes back. Was on some antibiotics in hospital and seemed ot improve as well. Seems to be better now but comes and goes.  A/P:  sounds like with lingering cough issues/post nasal drip and prior improvement on nasal steroids that this could be allergic rhinitis/allergies. Advised trial of nasacort which he has at home until runs out- or a minimum of 3 weeks. Can try flonase after that and use intermittently in the future if needed for recurrence.   - tested negative for covid 19 within a week   Recommended follow up: 6 month Future Appointments  Date Time Provider [place]  02/02/2019 10:30 AM Melrose Nakayama, MD [facility]  02/04/2019 10:00 AM Troy Sine, MD [facility]    Lab/Order associations:    ICD-10-CM   1. Hypertension associated with diabetes ([place])  E11.59    I10   2. Hyperlipidemia associated with type 2 diabetes mellitus ([place])  E11.69    E78.5   3. DM (diabetes mellitus) type II uncontrolled with eye manifestation ([facility])  E11.39 CBC   E11.65 Comprehensive metabolic panel    Hemoglobin A1c  4. Seasonal allergic rhinitis due to pollen  J30.1   5. Obesity (BMI 30-39.9)  E66.9   6. History of depression  Z86.59    Meds ordered this encounter  Medications  . fluticasone (FLONASE) 50 MCG/ACT nasal spray    Sig: Place 2 sprays into both nostrils daily.    Dispense:  16 g    Refill:  3   Return precautions advised.  [name], MD

## 2019-01-06 NOTE — Addendum Note (Signed)
Addended by: Jasper Loser on: 01/06/2019 12:04 PM   Modules accepted: Orders

## 2019-01-21 DIAGNOSIS — R262 Difficulty in walking, not elsewhere classified: Secondary | ICD-10-CM | POA: Diagnosis not present

## 2019-01-21 DIAGNOSIS — M6281 Muscle weakness (generalized): Secondary | ICD-10-CM | POA: Diagnosis not present

## 2019-02-01 ENCOUNTER — Other Ambulatory Visit: Payer: Self-pay | Admitting: Thoracic Surgery (Cardiothoracic Vascular Surgery)

## 2019-02-01 DIAGNOSIS — I25119 Atherosclerotic heart disease of native coronary artery with unspecified angina pectoris: Secondary | ICD-10-CM

## 2019-02-02 ENCOUNTER — Encounter: Payer: Self-pay | Admitting: Thoracic Surgery (Cardiothoracic Vascular Surgery)

## 2019-02-02 ENCOUNTER — Other Ambulatory Visit: Payer: Self-pay

## 2019-02-02 ENCOUNTER — Ambulatory Visit
Admission: RE | Admit: 2019-02-02 | Discharge: 2019-02-02 | Disposition: A | Discharge status: Home / Self Care | Payer: PPO | Source: Ambulatory Visit | Attending: Thoracic Surgery (Cardiothoracic Vascular Surgery) | Admitting: Thoracic Surgery (Cardiothoracic Vascular Surgery)

## 2019-02-02 ENCOUNTER — Ambulatory Visit: Payer: PPO | Admitting: Thoracic Surgery (Cardiothoracic Vascular Surgery)

## 2019-02-02 VITALS — BP 148/84 | HR 79 | Temp 97.7°F | Resp 20 | Ht 72.0 in | Wt 249.0 lb

## 2019-02-02 DIAGNOSIS — J9811 Atelectasis: Secondary | ICD-10-CM | POA: Diagnosis not present

## 2019-02-02 DIAGNOSIS — I25119 Atherosclerotic heart disease of native coronary artery with unspecified angina pectoris: Secondary | ICD-10-CM

## 2019-02-02 DIAGNOSIS — Z951 Presence of aortocoronary bypass graft: Secondary | ICD-10-CM | POA: Diagnosis not present

## 2019-02-02 NOTE — Progress Notes (Signed)
[address]       [place] [address]             [phone]     HPI: [name] for a scheduled follow-up visit  Martin [name] is a 73 year old man with a history of hypertension, hyperlipidemia, insulin-dependent type 2 diabetes, diabetic retinopathy and neuropathy, morbid obesity, coronary artery disease, STEMI, and coronary bypass grafting.  He presented with an ST elevation MI in June 2020.  I did coronary bypass grafting x4 on 10/01/2018.  His postoperative course was complicated by profound weakness, orthostatic hypotension, anemia, atrial fibrillation, acute kidney injury, severe deconditioning, and depression.  He went to a skilled nursing facility on 10/19/2018.  He has been home since August.  I last saw him in the office on September 1.  He was feeling better but his appetite was still poor.  We stopped his amiodarone.  He still had a frequent cough and noted movement in his sternum with cough.  Since his last visit he is continued to improve.  His only complaint now is swelling in his feet and ankles.  He denies chest pain, pressure, tightness, or shortness of breath.  His energy levels are improving.  His appetite is improved.  He does still feel movement in the sternum from time to time. Current Outpatient Medications  Medication Sig Dispense Refill  . amLODipine (NORVASC) 5 MG tablet Take 1 tablet (5 mg total) by mouth daily. 90 tablet 3  . aspirin EC 325 MG EC tablet Take 1 tablet (325 mg total) by mouth daily. 30 tablet 0  . BESIVANCE 0.6 % SUSP Place 1 drop into the left eye 3 (three) times daily. 5 mL 0  . brimonidine (ALPHAGAN) 0.2 % ophthalmic solution Place 1 drop into the left eye 3 (three) times daily. 5 mL 0  . dorzolamide (TRUSOPT) 2 % ophthalmic solution Place 1 drop into the left eye 3 (three) times daily. 10 mL 0  . feeding supplement, GLUCERNA SHAKE, (GLUCERNA SHAKE) LIQD Take 237 mLs by mouth 3 (three) times daily between meals.  0  .  fluticasone (FLONASE) 50 MCG/ACT nasal spray Place 2 sprays into both nostrils daily. 16 g 3  . furosemide (LASIX) 40 MG tablet Take 1 tablet (40 mg total) by mouth 2 (two) times daily. 30 tablet 3  . insulin aspart (NOVOLOG FLEXPEN) 100 UNIT/ML FlexPen Inject 25 Units into the skin 3 (three) times daily with meals. 5 pen 0  . metoprolol tartrate (LOPRESSOR) 25 MG tablet Take 12.5 mg by mouth 2 (two) times daily.     Glory Rosebush VERIO test strip CHECK LEVELS 4 TIMES DAILY 360 each 3  . Pitavastatin Calcium 1 MG TABS Take 1 tablet (1 mg total) by mouth 2 (two) times a week. 9 tablet 0  . potassium chloride (K-DUR) 10 MEQ tablet Take 1 tablet (10 mEq total) by mouth daily. 30 tablet 3   No current facility-administered medications for this visit.     Physical Exam BP (!) 148/84 (BP Location: Left Arm, Patient Position: Sitting)   Pulse 79   Temp 97.7 F (36.5 C) (Skin)   Resp 20 Comment: RA  Ht 6' (1.829 m)   Wt 249 lb (112.9 kg)   SpO2 94% Comment: RA  BMI 33.69 kg/m  73 year old man in no acute distress Alert and oriented x3 with no focal deficits Lungs clear with equal breath sounds bilaterally Cardiac regular rate and rhythm Sternal incision healed,  sternum unstable 3+ edema both lower extremities  Diagnostic Tests: CHEST - 2 VIEW  COMPARISON:  12/29/2018  FINDINGS: Lungs are hypoinflated with stable elevation of the right hemidiaphragm with stable mild right base opacification likely small amount of pleural fluid with atelectasis. Linear density left base likely atelectasis. Cardiomediastinal silhouette and remainder of the exam is unchanged.  IMPRESSION: Stable right base opacification likely small effusion with associated atelectasis. New linear atelectasis left base.   Electronically Signed   By: Martin [name] M.D.   On: 02/02/2019 10:39 I personally reviewed the chest x-ray images and concur with the findings noted above  Impression: Martin [name] is a  73 year old gentleman with a history of hypertension, hyperlipidemia, insulin-dependent type 2 diabetes, diabetic retinopathy, diabetic neuropathy, obesity, coronary artery disease, ST elevation MI, and coronary artery bypass grafting.  Coronary artery disease-status post STEMI.  Status post coronary bypass grafting x4 on 10/01/2018.  No anginal symptoms at present.  Exercise tolerance is significantly improved.  Congestive heart failure-diastolic dysfunction by echo.  He has significant edema in his lower legs.  He remains on Lasix, but that has not helped any.  He wonders if it could be due to his blood pressure medication.  He sees cardiology on Thursday so we did not make any medication changes today.  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  Sternal plating is an option if he becomes more symptomatic, but he is not interested in any intervention at this time.  Pleural effusions-resolved  Deconditioning-improving significantly.  Plan: Follow-up as scheduled with cardiology Return in 6 months with PA and lateral chest x-ray  Martin Nakayama, MD [facility] [phone]

## 2019-02-04 ENCOUNTER — Other Ambulatory Visit: Payer: Self-pay

## 2019-02-04 ENCOUNTER — Ambulatory Visit (INDEPENDENT_AMBULATORY_CARE_PROVIDER_SITE_OTHER): Payer: PPO | Admitting: Cardiovascular Disease

## 2019-02-04 ENCOUNTER — Telehealth (HOSPITAL_COMMUNITY): Payer: Self-pay | Admitting: *Deleted

## 2019-02-04 VITALS — BP 158/83 | HR 91 | Ht 72.0 in | Wt 249.0 lb

## 2019-02-04 DIAGNOSIS — Z951 Presence of aortocoronary bypass graft: Secondary | ICD-10-CM | POA: Diagnosis not present

## 2019-02-04 DIAGNOSIS — E785 Hyperlipidemia, unspecified: Secondary | ICD-10-CM

## 2019-02-04 DIAGNOSIS — Z79899 Other long term (current) drug therapy: Secondary | ICD-10-CM | POA: Diagnosis not present

## 2019-02-04 DIAGNOSIS — I480 Paroxysmal atrial fibrillation: Secondary | ICD-10-CM

## 2019-02-04 DIAGNOSIS — I249 Acute ischemic heart disease, unspecified: Secondary | ICD-10-CM

## 2019-02-04 DIAGNOSIS — E118 Type 2 diabetes mellitus with unspecified complications: Secondary | ICD-10-CM

## 2019-02-04 DIAGNOSIS — E1159 Type 2 diabetes mellitus with other circulatory complications: Secondary | ICD-10-CM | POA: Diagnosis not present

## 2019-02-04 DIAGNOSIS — R400 Somnolence: Secondary | ICD-10-CM

## 2019-02-04 DIAGNOSIS — I10 Essential (primary) hypertension: Secondary | ICD-10-CM | POA: Diagnosis not present

## 2019-02-04 DIAGNOSIS — R600 Localized edema: Secondary | ICD-10-CM | POA: Diagnosis not present

## 2019-02-04 DIAGNOSIS — I152 Hypertension secondary to endocrine disorders: Secondary | ICD-10-CM

## 2019-02-04 MED ORDER — ASPIRIN 81 MG PO TBEC: 81.0000 mg | DELAYED_RELEASE_TABLET | Freq: Every day | ORAL | Status: AC

## 2019-02-04 MED ORDER — EZETIMIBE 10 MG PO TABS: 10.0000 mg | ORAL_TABLET | Freq: Every day | ORAL | 2 refills | Status: DC

## 2019-02-04 MED ORDER — TORSEMIDE 20 MG PO TABS: 40.0000 mg | ORAL_TABLET | Freq: Every day | ORAL | 2 refills | Status: DC

## 2019-02-04 MED ORDER — PITAVASTATIN CALCIUM 1 MG PO TABS: 1.0000 mg | ORAL_TABLET | ORAL | 3 refills | Status: DC

## 2019-02-04 MED ORDER — LOSARTAN POTASSIUM 25 MG PO TABS: 25.0000 mg | ORAL_TABLET | Freq: Every day | ORAL | 3 refills | Status: DC

## 2019-02-04 MED ORDER — AMLODIPINE BESYLATE 2.5 MG PO TABS: 2.5000 mg | ORAL_TABLET | Freq: Every day | ORAL | 2 refills | Status: DC

## 2019-02-04 NOTE — Telephone Encounter (Signed)
-----   Message from Melrose Nakayama, MD sent at 02/04/2019 12:59 PM EDT ----- Regarding: RE: Ok for particiipation in group exercie at Cardiac Rehab No restrictions   [facility] ----- Message ----- From: Rowe Pavy, RN Sent: 02/04/2019  12:17 PM EDT To: Melrose Nakayama, MD Subject: [facility] for particiipation in group exercie at Ca#  Dr. Roxan Hockey,  The above pt who has a high risk score of 8  is eligible to participate in group exercise at Cardiac Rehab. Pt is s/p 6/2 STEMI, 6/4 CABG x 4.  Pt recently seen by you on 10/6.  Noted in your notes, pt has sternum movement -sternal nonunion.  Any restrictions of activity or hand weight limitations  for group exercise here at Cardiac Rehab?  Thanks for your advisement  Maurice Small RN, BSN Cardiac and Pulmonary Rehab Nurse Navigator

## 2019-02-04 NOTE — Patient Instructions (Signed)
Medication Instructions:  Stop lasix Start torsemide 40mg -2 tablets  DECREASE ASPIRIN TO 81MG  DAILY DECREASE AMLODIPINE 2.5MG  DAILY INCREASE LIVALO TO EVERY OTHER DAY THEN IF TOLERATED THEN TAKE DAILY START ZETIA 10MG  DAILY START LOSARTAN 25MG  DAILY If you need a refill on your cardiac medications before your next appointment, please call your pharmacy.  Labwork: IN 2 WEEK COME IN FOR BMET HERE IN OUR OFFICE AT [facility]   You will NOT need to fast   IN 4 WEEKS COME IN FOR FASTING LIPID AND CMET You will need to fast. DO NOT EAT OR DRINK PAST MIDNIGHT.      Take the provided lab slips with you to the lab for your blood draw.   When you have your labs (blood work) drawn today and your tests are completely normal, you will receive your results only by MyChart Message (if you have MyChart) -OR-  A paper copy in the mail.  If you have any lab test that is abnormal or we need to change your treatment, we will call you to review these results.  Testing/Procedures: Your physician has recommended that you have a sleep study. This test records several body functions during sleep, including: brain activity, eye movement, oxygen and carbon dioxide blood levels, heart rate and rhythm, breathing rate and rhythm, the flow of air through your mouth and nose, snoring, body muscle movements, and chest and belly movement.  Special Instructions: PLEASE PURCHASE AND WEAR COMPRESSION STOCKINGS DAILY AND OFF AT BEDTIME. Compression stockings are elastic socks that squeeze the legs. They help to increase blood flow to the legs and to decrease swelling in the legs from fluid retention, and reduce the chance of developing blood clots in the lower legs.   Follow-Up: You will need a follow up appointment in 6 weeks.   You may see Shelva Majestic, MD or one of the following Advanced Practice Providers on your designated Care Team:  Almyra Deforest, PA-C  Fabian Sharp, PA-C     At [facility], you and your health needs are  our priority.  As part of our continuing mission to provide you with exceptional heart care, we have created designated Provider Care Teams.  These Care Teams include your primary Cardiologist (physician) and Advanced Practice Providers (APPs -  Physician Assistants and Nurse Practitioners) who all work together to provide you with the care you need, when you need it.  Thank you for choosing [facility] at [facility]!!

## 2019-02-04 NOTE — Progress Notes (Signed)
Cardiology Office Note    Date:  02/07/2019   ID:  Martin Agee Sr., DOB April 01, 1946, MRN [id number]  PCP:  Marin Olp, MD  Cardiologist:  Shelva Majestic, MD   Initial office visit with me  History of Present Illness:  Martin Gervacio [facility] Sr. is a 73 y.o. male who has a history of hypertension, paroxysmal arrhythmias, and type 2 diabetes mellitus with peripheral neuropathy who is followed by Dr. Renato Shin and Dr. Yong Channel.  On the morning of September 29, 2018 he developed new substernal chest pressure which persisted.  EMS was notified and ECG showed early inferior lateral ST elevation.  A inferior STEMI was activated.  I performed emergent cardiac catheterization demonstrated acute inferior STEMI secondary to an ulcerated plaque in the mid RCA with residual 90% stenosis with extensive thrombus burden but with TIMI-3 flow and probable distal embolization to the apical portion of the PDA vessel.  There is moderate stenosis of the PDA proximally.  He had significant concomitant CAD with 30% left main stenosis, 95 at near ostial LAD stenosis, 60% diagonal stenosis, 40 and 30% diffuse mid LAD stenoses, and 90% diffuse stenosis in the proximal portion of the distal marginal branch prior to bifurcation the circumflex vessel.  Since the patient was pain-free intervention was performed and he ultimately underwent CABG x4 revascularization surgery by Dr. Roxan Hockey with a LIMA to the LAD, SVG to first diagonal, SVG to PDA and graft to the PLA (Y graft off PDA vein) with endoscopic vein harvest from the right leg.  An intraoperative echo showed mild LVH, left atrial dilatation, trace MR and EF of 55 to 65%.  His postoperative course was complicated by profound weakness, orthostatic hypotension, anemia, atrial fibrillation, acute kidney injury, severe deconditioning and depression.  He ultimately went to a skilled nursing facility on October 19, 2018 and ultimately went home in August 2020.  His amiodarone was  discontinued when he saw Dr. Roxan Hockey in the office on September 1.  He last saw Dr. Roxan Hockey on February 02, 2019.  The patient states that he has lost almost 50 pounds since his heart surgery.  He states he has a history of PAF for many years and typically would have 1-2 occurrences per year.  He admits to very poor sleep and typically wakes up every 1-2 hours.  His sleep is nonrestorative.  He admits to significant leg swelling following his surgery.  He presents for initial evaluation with me.   Past Medical History:  Diagnosis Date   Diabetes mellitus    Heart attack ([place]) 10/01/2018   Pt had open heart surgery   Hypertension    Tobacco abuse     Past Surgical History:  Procedure Laterality Date   CORONARY ARTERY BYPASS GRAFT N/A 10/01/2018   Procedure: CORONARY ARTERY BYPASS GRAFTING (CABG) TIMES  FOUR USING LEFT MAMMARY ARTERY AND RIGHT GREATER SAPHEANOUS VEIN HARVESTED ENDOSCOPICALLY;  Surgeon: Melrose Nakayama, MD;  Location: [facility];  Service: Open Heart Surgery;  Laterality: N/A;   HERNIA REPAIR     >10 years ago   IR RADIOLOGIST EVAL & MGMT  12/24/2016   IR THORACENTESIS ASP PLEURAL SPACE W/IMG GUIDE  10/06/2018   LEFT HEART CATH AND CORONARY ANGIOGRAPHY N/A 09/29/2018   Procedure: LEFT HEART CATH AND CORONARY ANGIOGRAPHY;  Surgeon: Troy Sine, MD;  Location: [facility];  Service: Cardiovascular;  Laterality: N/A;   open heart surfery     right shoulder surgery  around 2014   TEE WITHOUT CARDIOVERSION N/A 10/01/2018   Procedure: TRANSESOPHAGEAL ECHOCARDIOGRAM (TEE);  Surgeon: Melrose Nakayama, MD;  Location: [facility];  Service: Open Heart Surgery;  Laterality: N/A;    Current Medications: Outpatient Medications Prior to Visit  Medication Sig Dispense Refill   BESIVANCE 0.6 % SUSP Place 1 drop into the left eye 3 (three) times daily. 5 mL 0   brimonidine (ALPHAGAN) 0.2 % ophthalmic solution Place 1 drop into the left eye 3 (three) times daily.  5 mL 0   dorzolamide (TRUSOPT) 2 % ophthalmic solution Place 1 drop into the left eye 3 (three) times daily. 10 mL 0   feeding supplement, GLUCERNA SHAKE, (GLUCERNA SHAKE) LIQD Take 237 mLs by mouth 3 (three) times daily between meals.  0   fluticasone (FLONASE) 50 MCG/ACT nasal spray Place 2 sprays into both nostrils daily. 16 g 3   insulin aspart (NOVOLOG FLEXPEN) 100 UNIT/ML FlexPen Inject 25 Units into the skin 3 (three) times daily with meals. 5 pen 0   metoprolol tartrate (LOPRESSOR) 25 MG tablet Take 12.5 mg by mouth 2 (two) times daily.      ONETOUCH VERIO test strip CHECK LEVELS 4 TIMES DAILY 360 each 3   potassium chloride (K-DUR) 10 MEQ tablet Take 1 tablet (10 mEq total) by mouth daily. 30 tablet 3   amLODipine (NORVASC) 5 MG tablet Take 1 tablet (5 mg total) by mouth daily. 90 tablet 3   aspirin EC 325 MG EC tablet Take 1 tablet (325 mg total) by mouth daily. 30 tablet 0   furosemide (LASIX) 40 MG tablet Take 1 tablet (40 mg total) by mouth 2 (two) times daily. 30 tablet 3   Pitavastatin Calcium 1 MG TABS Take 1 tablet (1 mg total) by mouth 2 (two) times a week. 9 tablet 0   No facility-administered medications prior to visit.      Allergies:   Simvastatin   Social History   Socioeconomic History   Marital status: Married    Spouse name: Not on file   Number of children: Not on file   Years of education: Not on file   Highest education level: Not on file  Occupational History   Not on file  Social Needs   Financial resource strain: Not on file   Food insecurity    Worry: Not on file    Inability: Not on file   Transportation needs    Medical: Not on file    Non-medical: Not on file  Tobacco Use   Smoking status: Former Smoker    Quit date: 04/29/2000    Years since quitting: 18.7   Smokeless tobacco: Never Used   Tobacco comment: smoked for 10 years on and off  Substance and Sexual Activity   Alcohol use: Yes    Alcohol/week: 0.0 standard  drinks    Comment: 6 martinis a year   Drug use: No   Sexual activity: Yes  Lifestyle   Physical activity    Days per week: Not on file    Minutes per session: Not on file   Stress: Not on file  Relationships   Social connections    Talks on phone: Not on file    Gets together: Not on file    Attends religious service: Not on file    Active member of club or organization: Not on file    Attends meetings of clubs or organizations: Not on file    Relationship status: Not on  file  Other Topics Concern   Not on file  Social History Narrative   Married. 3 kids. 7 grandkids. No greatgrandkids.       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   ROS General: Negative; No fevers, chills, or night sweats; history of obesity HEENT: Negative; No changes in vision or hearing, sinus congestion, difficulty swallowing Pulmonary: Negative; No cough, wheezing, shortness of breath, hemoptysis Cardiovascular: See HPI GI: Negative; No nausea, vomiting, diarrhea, or abdominal pain GU: Negative; No dysuria, hematuria, or difficulty voiding Musculoskeletal: Negative; no myalgias, joint pain, or weakness Hematologic/Oncology: Negative; no easy bruising, bleeding Endocrine: Negative; no heat/cold intolerance; no diabetes Neuro: Negative; no changes in balance, headaches Skin: Negative; No rashes or skin lesions Psychiatric: Negative; No behavioral problems, depression Sleep: Poor sleep with snoring, daytime sleepiness, hypersomnolence, no bruxism, restless legs, hypnogognic hallucinations, no cataplexy Other comprehensive 14 point system review is negative.   PHYSICAL EXAM:   VS:  BP (!) 158/83    Pulse 91    Ht 6'  (1.829 m)    Wt 249 lb (112.9 kg)    SpO2 93%    BMI 33.77 kg/m     Repeat blood pressure by me was 164/86  Wt Readings from Last 3 Encounters:  02/04/19 249 lb (112.9 kg)  02/02/19 249 lb (112.9 kg)  01/06/19 241 lb 6.4 oz (109.5 kg)    General: Alert, oriented, no distress.  Skin: normal turgor, no rashes, warm and dry HEENT: Normocephalic, atraumatic. Pupils equal round and reactive to light; sclera anicteric; extraocular muscles intact;  Nose without nasal septal hypertrophy Mouth/Parynx benign; Mallinpatti scale 3/4  Neck: No JVD, no carotid bruits; normal carotid upstroke Lungs: clear to ausculatation and percussion; no wheezing or rales Chest wall: without tenderness to palpitation Heart: PMI not displaced, RRR, s1 s2 normal, 1/6 systolic murmur, no diastolic murmur, no rubs, gallops, thrills, or heaves Abdomen: soft, nontender; no hepatosplenomehaly, BS+; abdominal aorta nontender and not dilated by palpation. Back: no CVA tenderness Pulses 2+ Musculoskeletal: full range of motion, normal strength, no joint deformities Extremities: 3+ bilateral lower extremity edema to just below the knees no clubbing cyanosis, Homan's sign negative  Neurologic: grossly nonfocal; Cranial nerves grossly wnl Psychologic: Normal mood and affect   Studies/Labs Reviewed:   EKG:  EKG is ordered today.  ECG (independently read by me): Normal sinus rhythm at 90 bpm.  Possible left atrial enlargement nondiagnostic T changes..  Over 148 ms and QTc interval 467 ms  Recent Labs: BMP Latest Ref Rng & Units 01/06/2019 10/18/2018 10/17/2018  Glucose 70 - 99 mg/dL 181(H) 119(H) 181(H)  BUN 6 - 23 mg/dL 11 33(H) 39(H)  Creatinine 0.40 - 1.50 mg/dL 1.09 1.51(H) 1.77(H)  Sodium 135 - 145 mEq/L 138 128(L) 124(L)  Potassium 3.5 - 5.1 mEq/L 4.3 3.9 3.9  Chloride 96 - 112 mEq/L 97 91(L) 88(L)  CO2 19 - 32 mEq/L '30 27 27  '$ Calcium 8.4 - 10.5 mg/dL 9.1 8.6(L) 8.4(L)     Hepatic Function Latest Ref Rng & Units  01/06/2019 10/07/2018 09/29/2018  Total Protein 6.0 - 8.3 g/dL 7.3 7.2 7.3  Albumin 3.5 - 5.2 g/dL 3.8 3.2(L) 3.9  AST 0 - 37 U/L 10  55(H) 32  ALT 0 - 53 U/L 6 58(H) 22  Alk Phosphatase 39 - 117 U/L 52 120 51  Total Bilirubin 0.2 - 1.2 mg/dL 1.0 1.1 1.6(H)  Bilirubin, Direct 0.0 - 0.3 mg/dL - - -    CBC Latest Ref Rng & Units 01/06/2019 10/16/2018 10/10/2018  WBC 4.0 - 10.5 K/uL 8.3 17.7(H) 12.9(H)  Hemoglobin 13.0 - 17.0 g/dL 12.0(L) 8.9(L) 9.0(L)  Hematocrit 39.0 - 52.0 % 37.5(L) 26.7(L) 27.4(L)  Platelets 150.0 - 400.0 K/uL 320.0 343 394   Lab Results  Component Value Date   MCV 82.7 01/06/2019   MCV 86.7 10/16/2018   MCV 88.4 10/10/2018   Lab Results  Component Value Date   TSH 4.13 08/08/2015   Lab Results  Component Value Date   HGBA1C 8.2 (H) 01/06/2019     BNP No results found for: BNP  ProBNP    Component Value Date/Time   PROBNP 9.0 08/17/2013 1005     Lipid Panel     Component Value Date/Time   CHOL 205 (H) 09/29/2018 1140   TRIG 195 (H) 09/29/2018 1140   HDL 27 (L) 09/29/2018 1140   CHOLHDL 7.6 09/29/2018 1140   VLDL 39 09/29/2018 1140   LDLCALC 139 (H) 09/29/2018 1140   LDLDIRECT 173.0 07/01/2018 1118     RADIOLOGY: Dg Chest 2 View  Result Date: 02/02/2019 CLINICAL DATA:  Coronary artery disease currently asymptomatic. EXAM: CHEST - 2 VIEW COMPARISON:  12/29/2018 FINDINGS: Lungs are hypoinflated with stable elevation of the right hemidiaphragm with stable mild right base opacification likely small amount of pleural fluid with atelectasis. Linear density left base likely atelectasis. Cardiomediastinal silhouette and remainder of the exam is unchanged. IMPRESSION: Stable right base opacification likely small effusion with associated atelectasis. New linear atelectasis left base. Electronically Signed   By: Marin Olp M.D.   On: 02/02/2019 10:39     Additional studies/ records that were reviewed today include:   Emergent catheterization: September 29, 2018  Mid RCA lesion is 90% stenosed.  Ost RPDA lesion is 30% stenosed.  RPDA-1 lesion is 40% stenosed.  RPDA-2 lesion is 50% stenosed.  RPDA-3 lesion is 100% stenosed.  Ost LAD lesion is 95% stenosed.  Mid Cx to Dist Cx lesion is 90% stenosed.  Ost 1st Diag lesion is 60% stenosed.  Prox LAD to Mid LAD lesion is 40% stenosed.  Mid LAD lesion is 30% stenosed.  Ost LM to Mid LM lesion is 30% stenosed.   Acute inferior STEMI secondary to ulcerated plaque in the mid RCA with residual 90% stenosis with extensive thrombus but with brisk TIMI-3 flow and probable distal embolization to the apical portion of the PDA vessel.  There is moderate stenoses in the PDA proximal to the distal embolization.  Significant concomitant CAD with 30% left main stenosis; 95% near ostial LAD stenosis, 60% diagonal stenosis with 40 and 30% diffuse mid LAD stenosis; normal high marginal ramus like vessel, with 90% diffuse stenosis in the proximal portion of a distal marginal branch prior to its bifurcation.  LVEDP 11 mm.  Transient development of tachycardia most likely paroxysmal atrial fibrillation with ventricular rate at 150 bpm which responded to metoprolol IV 2.5 mg x 2.  RECOMMENDATION: Angiograms were reviewed with Dr. Irish Lack as well as with Dr. Tharon Aquas Tright.  Although the culprit vessel is in the mid RCA which can be intervened upon, with the high-grade subtotal near ostial LAD stenosis in an area that may be difficult to stent  due to potential jeopardy to a ramus-like vessel in the circumflex vessel in addition to the patient's concomitant CAD it was felt that the patient should undergo elective CABG revascularization following stabilization.  As result he was started on Aggrastat bolus plus infusion and with plans to resume heparin.  An echo Doppler study will be done to reevaluate LV function.  He will be started on low-dose beta-blocker therapy, high potency statin therapy, with plans for  CABG revascularization surgery later this week.   ASSESSMENT:    1. Acute coronary syndrome ([place]): 09/29/2018   2. S/P CABG x 4   3. Hypertension associated with diabetes ([place])   4. Bilateral lower extremity edema   5. Hyperlipidemia with target LDL less than 70   6. Paroxysmal atrial fibrillation ([facility])   7. Daytime somnolence   8. Type 2 diabetes mellitus with complication, without long-term current use of insulin ([place])   9. Medication management     PLAN:  Martin [name] is a 73 year old gentleman who has a history of hypertension, obesity, diabetes mellitus, as well as paroxysmal arrhythmias for several years.  He suffered an acute inferolateral ST segment elevation sepsis secondary to ruptured plaque in his mid RCA resulting in distal embolization to the apical PDA vessel but at presentation to the hospital he was pain-free and due to concomitant CAD CABG revascularization was recommended.  An intraoperative TEE revealed normal LV function.  He had a LIMA placed to his LAD, vein graft to diagonal vessel, and a Y graft placed to the PDA and PLA.  His postoperative course was complicated by PAF, anemia, orthostatic hypotension, acute kidney injury, severe deconditioning and depression.  He required skilled nursing facility post hospitalization.  Most recently, he has been on amlodipine 5 mg, furosemide 40 mg, metoprolol 12.5 mg twice a day.  His blood pressure today is elevated.  His renal function subsequently improved and on September 9 serum creatinine was 1.09.  As result, I have elected to add low-dose losartan 25 mg daily.  He continues to experience significant swelling despite taking furosemide and I recommended discontinuance of furosemide and in its place initiate torsemide at 40 mg daily.  I will decrease his amlodipine to 2.5 mg since this may be contributory to his lower extremity edema.  He has been on full dose aspirin and I have recommended he reduce this to 81 mg.  He apparently  has a history of not being able to tolerate certain statins but has been receiving the tablet statin 1 mg 2 days/week.  I have suggested he try increasing this to 1 mg every other day for the next 2 weeks and then take daily if he tolerates.  I am recommending support stockings for his continued lower extremity edema at 20 to 30 mm pressure.  He admits to a longstanding history of poor sleep with frequent awakenings, daytime sleepiness, and loud snoring.  With his history of previous arrhythmias I am also recommending an evaluation for obstructive sleep apnea and will schedule him for a sleep study.  He has a history of diabetes mellitus.  Apparently there has been a 50 pound weight loss since his initial presentation over the past 4 months.  He is followed by Dr. Renato Shin as well as Dr. [name] for his diabetes.  I have recommended that in 2 weeks have a BMet to assess his renal function with the change to torsemide and initiation of low-dose ARB therapy.  In 4 weeks he will  undergo a fasting lipid panel and comprehensive metabolic panel.  I will see him in 6 weeks for reevaluation and further recommendations were made at that time.  Medication Adjustments/Labs and Tests Ordered: Current medicines are reviewed at length with the patient today.  Concerns regarding medicines are outlined above.  Medication changes, Labs and Tests ordered today are listed in the Patient Instructions below. Patient Instructions  Medication Instructions:  Stop lasix Start torsemide '40mg'$ -2 tablets  DECREASE ASPIRIN TO '81MG'$  DAILY DECREASE AMLODIPINE 2.'5MG'$  DAILY INCREASE LIVALO TO EVERY OTHER DAY THEN IF TOLERATED THEN TAKE DAILY START ZETIA '10MG'$  DAILY START LOSARTAN '25MG'$  DAILY If you need a refill on your cardiac medications before your next appointment, please call your pharmacy.  Labwork: IN 2 WEEK COME IN FOR BMET HERE IN OUR OFFICE AT [facility]   You will NOT need to fast   IN 4 WEEKS COME IN FOR FASTING  LIPID AND CMET You will need to fast. DO NOT EAT OR DRINK PAST MIDNIGHT.      Take the provided lab slips with you to the lab for your blood draw.   When you have your labs (blood work) drawn today and your tests are completely normal, you will receive your results only by MyChart Message (if you have MyChart) -OR-  A paper copy in the mail.  If you have any lab test that is abnormal or we need to change your treatment, we will call you to review these results.  Testing/Procedures: Your physician has recommended that you have a sleep study. This test records several body functions during sleep, including: brain activity, eye movement, oxygen and carbon dioxide blood levels, heart rate and rhythm, breathing rate and rhythm, the flow of air through your mouth and nose, snoring, body muscle movements, and chest and belly movement.  Special Instructions: PLEASE PURCHASE AND WEAR COMPRESSION STOCKINGS DAILY AND OFF AT BEDTIME. Compression stockings are elastic socks that squeeze the legs. They help to increase blood flow to the legs and to decrease swelling in the legs from fluid retention, and reduce the chance of developing blood clots in the lower legs.   Follow-Up: You will need a follow up appointment in 6 weeks.   You may see Shelva Majestic, MD or one of the following Advanced Practice Providers on your designated Care Team:  Almyra Deforest, PA-C  Fabian Sharp, PA-C     At [facility], you and your health needs are our priority.  As part of our continuing mission to provide you with exceptional heart care, we have created designated Provider Care Teams.  These Care Teams include your primary Cardiologist (physician) and Advanced Practice Providers (APPs -  Physician Assistants and Nurse Practitioners) who all work together to provide you with the care you need, when you need it.  Thank you for choosing [facility] at [facility]!!        Signed, Shelva Majestic, MD  02/07/2019 10:28 AM    [facility] [address], [place], [place], [place]  [address] Phone: [phone]

## 2019-02-06 ENCOUNTER — Other Ambulatory Visit: Payer: Self-pay | Admitting: Thoracic Surgery (Cardiothoracic Vascular Surgery)

## 2019-02-07 ENCOUNTER — Encounter: Payer: Self-pay | Admitting: Cardiovascular Disease

## 2019-02-07 ENCOUNTER — Other Ambulatory Visit: Payer: Self-pay | Admitting: Thoracic Surgery (Cardiothoracic Vascular Surgery)

## 2019-02-08 ENCOUNTER — Other Ambulatory Visit: Payer: Self-pay | Admitting: Cardiovascular Disease

## 2019-02-10 ENCOUNTER — Telehealth (HOSPITAL_COMMUNITY): Payer: Self-pay

## 2019-02-10 ENCOUNTER — Other Ambulatory Visit: Payer: Self-pay | Admitting: Thoracic Surgery (Cardiothoracic Vascular Surgery)

## 2019-02-10 NOTE — Telephone Encounter (Signed)
Called and spoke with pt in regards to CR, pt stated he is unable to participate at this with all the holidays coming up.  Mailed pt a brochure, closed referral

## 2019-02-11 ENCOUNTER — Telehealth: Payer: Self-pay | Admitting: Cardiovascular Disease

## 2019-02-11 NOTE — Telephone Encounter (Signed)
Spoke with patient and he is very adamant about having his sleep study done through [facility].he does not want to have another COVID test. States that he has already had 6 done. I explained to him this is not only for HIS safety, but the other patients and staff as well. He got really upset and states that if we cannot accommodate his wishes, then he will find a doctor that can.  Message will be routed to Dr Claiborne Billings for review and recommendations.

## 2019-02-11 NOTE — Telephone Encounter (Signed)
° ° ° °  Patient wants to have sleep study at [facility].  Patient does not want to have covid test, nor have sleep study at [facility] Patient states he has been tested too many times for covid19    Requesting call from Dr Claiborne Billings. Please call  [phone]

## 2019-02-16 ENCOUNTER — Other Ambulatory Visit (HOSPITAL_COMMUNITY): Payer: PPO

## 2019-02-17 DIAGNOSIS — Z79899 Other long term (current) drug therapy: Secondary | ICD-10-CM | POA: Diagnosis not present

## 2019-02-17 DIAGNOSIS — R400 Somnolence: Secondary | ICD-10-CM | POA: Diagnosis not present

## 2019-02-17 DIAGNOSIS — I10 Essential (primary) hypertension: Secondary | ICD-10-CM | POA: Diagnosis not present

## 2019-02-17 DIAGNOSIS — E1159 Type 2 diabetes mellitus with other circulatory complications: Secondary | ICD-10-CM | POA: Diagnosis not present

## 2019-02-17 LAB — BASIC METABOLIC PANEL
BUN/Creatinine Ratio: 11 (ref 10–24)
BUN: 13 mg/dL (ref 8–27)
CO2: 26 mmol/L (ref 20–29)
Calcium: 9.4 mg/dL (ref 8.6–10.2)
Chloride: 96 mmol/L (ref 96–106)
Creatinine, Ser: 1.2 mg/dL (ref 0.76–1.27)
GFR calc Af Amer: 68 mL/min/{1.73_m2} (ref >59)
GFR calc non Af Amer: 59 mL/min/{1.73_m2} — ABNORMAL LOW (ref >59)
Glucose: 227 mg/dL — ABNORMAL HIGH (ref 65–99)
Potassium: 5.1 mmol/L (ref 3.5–5.2)
Sodium: 137 mmol/L (ref 134–144)

## 2019-02-17 NOTE — Telephone Encounter (Signed)
[place] for sleep study at [facility]

## 2019-02-19 ENCOUNTER — Encounter (HOSPITAL_BASED_OUTPATIENT_CLINIC_OR_DEPARTMENT_OTHER): Payer: PPO | Admitting: Cardiovascular Disease

## 2019-02-20 DIAGNOSIS — M6281 Muscle weakness (generalized): Secondary | ICD-10-CM | POA: Diagnosis not present

## 2019-02-20 DIAGNOSIS — R262 Difficulty in walking, not elsewhere classified: Secondary | ICD-10-CM | POA: Diagnosis not present

## 2019-02-22 ENCOUNTER — Other Ambulatory Visit (HOSPITAL_COMMUNITY): Payer: PPO

## 2019-02-25 ENCOUNTER — Encounter (HOSPITAL_BASED_OUTPATIENT_CLINIC_OR_DEPARTMENT_OTHER): Payer: PPO | Admitting: Cardiovascular Disease

## 2019-02-26 ENCOUNTER — Other Ambulatory Visit: Payer: Self-pay | Admitting: Cardiovascular Disease

## 2019-02-27 DIAGNOSIS — M7989 Other specified soft tissue disorders: Secondary | ICD-10-CM | POA: Diagnosis not present

## 2019-02-27 DIAGNOSIS — E1122 Type 2 diabetes mellitus with diabetic chronic kidney disease: Secondary | ICD-10-CM | POA: Diagnosis not present

## 2019-02-27 DIAGNOSIS — I491 Atrial premature depolarization: Secondary | ICD-10-CM | POA: Diagnosis not present

## 2019-02-27 DIAGNOSIS — Z79899 Other long term (current) drug therapy: Secondary | ICD-10-CM | POA: Diagnosis not present

## 2019-02-27 DIAGNOSIS — I2510 Atherosclerotic heart disease of native coronary artery without angina pectoris: Secondary | ICD-10-CM | POA: Diagnosis not present

## 2019-02-27 DIAGNOSIS — I130 Hypertensive heart and chronic kidney disease with heart failure and stage 1 through stage 4 chronic kidney disease, or unspecified chronic kidney disease: Secondary | ICD-10-CM | POA: Diagnosis not present

## 2019-02-27 DIAGNOSIS — N189 Chronic kidney disease, unspecified: Secondary | ICD-10-CM | POA: Diagnosis not present

## 2019-02-27 DIAGNOSIS — Z87891 Personal history of nicotine dependence: Secondary | ICD-10-CM | POA: Diagnosis not present

## 2019-02-27 DIAGNOSIS — R079 Chest pain, unspecified: Secondary | ICD-10-CM | POA: Diagnosis not present

## 2019-02-27 DIAGNOSIS — R002 Palpitations: Secondary | ICD-10-CM | POA: Diagnosis not present

## 2019-02-27 DIAGNOSIS — I4891 Unspecified atrial fibrillation: Secondary | ICD-10-CM | POA: Diagnosis not present

## 2019-02-27 DIAGNOSIS — E785 Hyperlipidemia, unspecified: Secondary | ICD-10-CM | POA: Diagnosis not present

## 2019-02-27 DIAGNOSIS — I509 Heart failure, unspecified: Secondary | ICD-10-CM | POA: Diagnosis not present

## 2019-03-04 DIAGNOSIS — Z79899 Other long term (current) drug therapy: Secondary | ICD-10-CM | POA: Diagnosis not present

## 2019-03-04 DIAGNOSIS — E1159 Type 2 diabetes mellitus with other circulatory complications: Secondary | ICD-10-CM | POA: Diagnosis not present

## 2019-03-04 DIAGNOSIS — I10 Essential (primary) hypertension: Secondary | ICD-10-CM | POA: Diagnosis not present

## 2019-03-05 LAB — COMPREHENSIVE METABOLIC PANEL
ALT: 6 IU/L (ref 0–44)
AST: 10 IU/L (ref 0–40)
Albumin/Globulin Ratio: 1.2 (ref 1.2–2.2)
Albumin: 4.3 g/dL (ref 3.7–4.7)
Alkaline Phosphatase: 86 IU/L (ref 39–117)
BUN/Creatinine Ratio: 12 (ref 10–24)
BUN: 15 mg/dL (ref 8–27)
Bilirubin Total: 1.1 mg/dL (ref 0.0–1.2)
CO2: 24 mmol/L (ref 20–29)
Calcium: 9.3 mg/dL (ref 8.6–10.2)
Chloride: 98 mmol/L (ref 96–106)
Creatinine, Ser: 1.24 mg/dL (ref 0.76–1.27)
GFR calc Af Amer: 66 mL/min/{1.73_m2} (ref >59)
GFR calc non Af Amer: 57 mL/min/{1.73_m2} — ABNORMAL LOW (ref >59)
Globulin, Total: 3.6 g/dL (ref 1.5–4.5)
Glucose: 215 mg/dL — ABNORMAL HIGH (ref 65–99)
Potassium: 5.3 mmol/L — ABNORMAL HIGH (ref 3.5–5.2)
Sodium: 138 mmol/L (ref 134–144)
Total Protein: 7.9 g/dL (ref 6.0–8.5)

## 2019-03-10 ENCOUNTER — Telehealth: Payer: Self-pay | Admitting: Cardiovascular Disease

## 2019-03-10 NOTE — Telephone Encounter (Signed)
Spoke with patient.  Notes that home BP readings were down to [id number] systolic, so stopped taking metoprolol 12.5 mg bid and amlodipine 2.5 mg qd.  Continued with losartan 25 mg.  Based on pt medical history and home BP trending closer to 140, advised patient to restart metoprolol 12.5 mg bid.    Of note, metabolic panel drawn this weeks shows potassium level up to 5.3.  He is no longer taking torsemide and potassium supplement. Will have him cut losartan 25 mg tab in half for now, until Dr. Claiborne Billings reviews labs.  Patient voiced understanding.

## 2019-03-10 NOTE — Telephone Encounter (Signed)
New Message     Pt c/o BP issue: STAT if pt c/o blurred vision, one-sided weakness or slurred speech  1. What are your last 5 BP readings?  Today 141/84 139/80 135/70  Before stopped taking medication  99/58 96/54 100/55 101/57 86/59 Pulse 80/89   2. Are you having any other symptoms (ex. Dizziness, headache, blurred vision, passed out)? Dizziness and blurred Vision    3. What is your BP issue? Pt says he started taking 3 medications and his BP was running low.  He says he stopped taking 2 medications, Amlodipine and metoprolol and Monday because of his low BP  Pt says he is feeling better since he stopped taking them. He doesn't have the blurred vision or dizziness anymore   Pt would also like the lab work results     Please call

## 2019-03-15 ENCOUNTER — Other Ambulatory Visit: Payer: Self-pay | Admitting: Cardiovascular Disease

## 2019-03-15 ENCOUNTER — Telehealth: Payer: Self-pay | Admitting: Cardiovascular Disease

## 2019-03-15 NOTE — Telephone Encounter (Signed)
Spoke with patient and he stated he would be making any in office visits with the spike in [place]. Scheduled patient for virtual vs in office.

## 2019-03-15 NOTE — Telephone Encounter (Signed)
° °  New message  Patient would like to know if Dr Claiborne Billings can change his 11/17 to virtual. Patient has concerns about the pandemic and coming to the office. Please call at [phone]

## 2019-03-16 ENCOUNTER — Encounter: Payer: Self-pay | Admitting: Cardiovascular Disease

## 2019-03-16 ENCOUNTER — Telehealth (INDEPENDENT_AMBULATORY_CARE_PROVIDER_SITE_OTHER): Payer: PPO | Admitting: Cardiovascular Disease

## 2019-03-16 DIAGNOSIS — G4733 Obstructive sleep apnea (adult) (pediatric): Secondary | ICD-10-CM | POA: Diagnosis not present

## 2019-03-16 DIAGNOSIS — I249 Acute ischemic heart disease, unspecified: Secondary | ICD-10-CM

## 2019-03-16 DIAGNOSIS — I152 Hypertension secondary to endocrine disorders: Secondary | ICD-10-CM

## 2019-03-16 DIAGNOSIS — I480 Paroxysmal atrial fibrillation: Secondary | ICD-10-CM | POA: Diagnosis not present

## 2019-03-16 DIAGNOSIS — E785 Hyperlipidemia, unspecified: Secondary | ICD-10-CM | POA: Diagnosis not present

## 2019-03-16 DIAGNOSIS — I10 Essential (primary) hypertension: Secondary | ICD-10-CM | POA: Diagnosis not present

## 2019-03-16 DIAGNOSIS — Z951 Presence of aortocoronary bypass graft: Secondary | ICD-10-CM | POA: Diagnosis not present

## 2019-03-16 DIAGNOSIS — R600 Localized edema: Secondary | ICD-10-CM

## 2019-03-16 DIAGNOSIS — E118 Type 2 diabetes mellitus with unspecified complications: Secondary | ICD-10-CM

## 2019-03-16 DIAGNOSIS — Z79899 Other long term (current) drug therapy: Secondary | ICD-10-CM | POA: Diagnosis not present

## 2019-03-16 DIAGNOSIS — E1159 Type 2 diabetes mellitus with other circulatory complications: Secondary | ICD-10-CM

## 2019-03-16 MED ORDER — TORSEMIDE 20 MG PO TABS: 40.0000 mg | ORAL_TABLET | ORAL | 6 refills | Status: DC

## 2019-03-16 NOTE — Patient Instructions (Signed)
Medication Instructions:  TAKE LIVALO DAILY RE-START TORSEMIDE 20MG  EVER-OTHER-DAY  If you need a refill on your cardiac medications before your next appointment, please call your pharmacy.  Labwork: FASTING LIPID PANEL AND CMET IN 4 WEEKS-04-15-2019 HERE IN OUR OFFICE AT [facility]    You will need to fast. DO NOT EAT OR DRINK PAST MIDNIGHT.       If you have labs (blood work) drawn today and your tests are completely normal, you will receive your results only by: Marland Kitchen MyChart Message (if you have MyChart) OR . A paper copy in the mail If you have any lab test that is abnormal or we need to change your treatment, we will call you to review the results.  Follow-Up: IN 6 months Please call our office 2 months in advance, [facility] 2021 to schedule this MAY 2021 appointment. In Person Shelva Majestic, MD.    At [facility], you and your health needs are our priority.  As part of our continuing mission to provide you with exceptional heart care, we have created designated Provider Care Teams.  These Care Teams include your primary Cardiologist (physician) and Advanced Practice Providers (APPs -  Physician Assistants and Nurse Practitioners) who all work together to provide you with the care you need, when you need it.  Thank you for choosing [facility] at [facility]!!

## 2019-03-16 NOTE — Progress Notes (Signed)
Virtual Visit via Telephone Note   This visit type was conducted due to national recommendations for restrictions regarding the COVID-19 Pandemic (e.g. social distancing) in an effort to limit this patient's exposure and mitigate transmission in our community.  Due to his co-morbid illnesses, this patient is at least at moderate risk for complications without adequate follow up.  This format is felt to be most appropriate for this patient at this time.  The patient did not have access to video technology/had technical difficulties with video requiring transitioning to audio format only (telephone).  All issues noted in this document were discussed and addressed.  No physical exam could be performed with this format.  Please refer to the patient's chart for his  consent to telehealth for [facility].   Date:  03/16/2019   ID:  Martin Agee Sr., DOB [birth year], MRN [id number]  Patient Location: Home Provider Location: Office  PCP:  Marin Olp, MD  Cardiologist:  Shelva Majestic, MD  Electrophysiologist:  None   Evaluation Performed:  Follow-Up Visit  Chief Complaint:  6 week F/U  History of Present Illness:    Martin CLEMONS Sr. is a 73 y.o. male who has a history of hypertension, paroxysmal arrhythmias, and type 2 diabetes mellitus with peripheral neuropathy who is followed by Dr. Renato Shin and Dr. Yong Channel.  On the morning of September 29, 2018 he developed new substernal chest pressure which persisted.  EMS was notified and ECG showed early inferior lateral ST elevation.  A inferior STEMI was activated.  I performed emergent cardiac catheterization demonstrated acute inferior STEMI secondary to an ulcerated plaque in the mid RCA with residual 90% stenosis with extensive thrombus burden but with TIMI-3 flow and probable distal embolization to the apical portion of the PDA vessel.  There is moderate stenosis of the PDA proximally.  He had significant concomitant CAD with 30% left main  stenosis, 95 at near ostial LAD stenosis, 60% diagonal stenosis, 40 and 30% diffuse mid LAD stenoses, and 90% diffuse stenosis in the proximal portion of the distal marginal branch prior to bifurcation the circumflex vessel.  Since the patient was pain-free intervention was performed and he ultimately underwent CABG x4 revascularization surgery by Dr. Roxan Hockey with a LIMA to the LAD, SVG to first diagonal, SVG to PDA and graft to the PLA (Y graft off PDA vein) with endoscopic vein harvest from the right leg.  An intraoperative echo showed mild LVH, left atrial dilatation, trace MR and EF of 55 to 65%.  His postoperative course was complicated by profound weakness, orthostatic hypotension, anemia, atrial fibrillation, acute kidney injury, severe deconditioning and depression.  He ultimately went to a skilled nursing facility on October 19, 2018 and ultimately went home in August 2020.  His amiodarone was discontinued when he saw Dr. Roxan Hockey in the office on September 1.  He last saw Dr. Roxan Hockey on February 02, 2019.  The patient states that he has lost almost 50 pounds since his heart surgery.  He states he has a history of PAF for many years and typically would have 1-2 occurrences per year.  He admits to very poor sleep and typically wakes up every 1-2 hours.  His sleep is nonrestorative.  He admits to significant leg swelling following his surgery.   I saw him for an initial in office evaluation on February 04, 2019.  At that time his blood pressure was significantly elevated despite taking amlodipine 5 mg, furosemide 40 mg, and metoprolol 12.5  mg twice a day.  Since his renal function improved, I recommended initiation of low-dose losartan at 25 mg daily.  At that time, he was continuing to experience significant swelling despite taking furosemide and recommended discontinuance of furosemide and switch him to torsemide.  He presented with a 2.5 mg since this may have been contributory to his lower extremity  edema.  I recommended he discontinue full dose aspirin and substitute this with 81 mg aspirin.  Since he was only taking 1 mg of Livalo twice per week I suggested he try increasing this to 1 mg every other day for 2 weeks and then increase to daily as tolerated.  I recommended support stockings.  I was also concerned about sleep apnea due to his longstanding history of poor sleep with frequent awakenings, daytime sleepiness, and loud snoring.   Mr. Billick continues to be followed by Dr. Loanne Drilling for his diabetes mellitus and blood sugars have improved.  He has extreme anxiety about Covid concerns and called the office requesting a telemedicine visit rather than an office evaluation.  He does not want to pursue a sleep evaluation until after the Covid pandemic has resolved.  He denies any anginal symptoms.  His lower extremity has improved off amlodipine.  He presents for evaluation.   The patient does not have symptoms concerning for COVID-19 infection (fever, chills, cough, or new shortness of breath).    Past Medical History:  Diagnosis Date   Diabetes mellitus    Heart attack ([place]) 10/01/2018   Pt had open heart surgery   Hypertension    Tobacco abuse    Past Surgical History:  Procedure Laterality Date   CORONARY ARTERY BYPASS GRAFT N/A 10/01/2018   Procedure: CORONARY ARTERY BYPASS GRAFTING (CABG) TIMES  FOUR USING LEFT MAMMARY ARTERY AND RIGHT GREATER SAPHEANOUS VEIN HARVESTED ENDOSCOPICALLY;  Surgeon: Melrose Nakayama, MD;  Location: [place];  Service: Open Heart Surgery;  Laterality: N/A;   HERNIA REPAIR     >10 years ago   IR RADIOLOGIST EVAL & MGMT  12/24/2016   IR THORACENTESIS ASP PLEURAL SPACE W/IMG GUIDE  10/06/2018   LEFT HEART CATH AND CORONARY ANGIOGRAPHY N/A 09/29/2018   Procedure: LEFT HEART CATH AND CORONARY ANGIOGRAPHY;  Surgeon: Troy Sine, MD;  Location: [place] CV LAB;  Service: Cardiovascular;  Laterality: N/A;   open heart surfery     right shoulder  surgery     around 2014   TEE WITHOUT CARDIOVERSION N/A 10/01/2018   Procedure: TRANSESOPHAGEAL ECHOCARDIOGRAM (TEE);  Surgeon: Melrose Nakayama, MD;  Location: [place];  Service: Open Heart Surgery;  Laterality: N/A;     Current Meds  Medication Sig   aspirin 81 MG EC tablet Take 1 tablet (81 mg total) by mouth daily.   BESIVANCE 0.6 % SUSP Place 1 drop into the left eye 3 (three) times daily.   brimonidine (ALPHAGAN) 0.2 % ophthalmic solution Place 1 drop into the left eye 3 (three) times daily.   dorzolamide (TRUSOPT) 2 % ophthalmic solution Place 1 drop into the left eye 3 (three) times daily.   ezetimibe (ZETIA) 10 MG tablet Take 1 tablet (10 mg total) by mouth daily.   feeding supplement, GLUCERNA SHAKE, (GLUCERNA SHAKE) LIQD Take 237 mLs by mouth 3 (three) times daily between meals.   fluticasone (FLONASE) 50 MCG/ACT nasal spray Place 2 sprays into both nostrils daily.   insulin aspart (NOVOLOG FLEXPEN) 100 UNIT/ML FlexPen Inject 25 Units into the skin 3 (three) times daily  with meals.   losartan (COZAAR) 25 MG tablet Take 1 tablet (25 mg total) by mouth daily. (Patient taking differently: Take 12.5 mg by mouth daily. )   metoprolol tartrate (LOPRESSOR) 25 MG tablet Take 12.5 mg by mouth 2 (two) times daily.    ONETOUCH VERIO test strip CHECK LEVELS 4 TIMES DAILY   Pitavastatin Calcium (LIVALO) 1 MG TABS Take 1 tablet (1 mg total) by mouth daily.   [DISCONTINUED] KLOR-CON M10 10 MEQ tablet TAKE 1 TABLET BY MOUTH EVERY DAY     Allergies:   Simvastatin and Statins   Social History   Tobacco Use   Smoking status: Former Smoker    Quit date: 04/29/2000    Years since quitting: 18.8   Smokeless tobacco: Never Used   Tobacco comment: smoked for 10 years on and off  Substance Use Topics   Alcohol use: Yes    Alcohol/week: 0.0 standard drinks    Comment: 6 martinis a year   Drug use: No     Family Hx: The patient's family history includes Diabetes in his  maternal grandmother and son; Heart disease in his mother; Hypertension in his mother.  ROS:   Please see the history of present illness.    Negative for fever chills night sweats No cough No wheezing No recurrent angina Improvement in previous significant lower extremity edema Positive for diabetes mellitus Positive for hyperlipidemia not tolerant to statins very well Sleeping poorly All other systems reviewed and are negative.   Prior CV studies:   The following studies were reviewed today:  I have reviewed the patient's September 29, 2018 catheterization findings as noted above.  Labs/Other Tests and Data Reviewed:    EKG:  An ECG dated 02/04/2019 was personally reviewed today and demonstrated:  Normal sinus rhythm at 90 bpm, left atrial enlargement, nondiagnostic T changes.  QTc interval 467 ms.  PR interval 148 ms  Recent Labs: 10/07/2018: Magnesium 2.8 01/06/2019: Hemoglobin 12.0; Platelets 320.0 03/04/2019: ALT 6; BUN 15; Creatinine, Ser 1.24; Potassium 5.3; Sodium 138   Recent Lipid Panel Lab Results  Component Value Date/Time   CHOL 205 (H) 09/29/2018 11:40 AM   TRIG 195 (H) 09/29/2018 11:40 AM   HDL 27 (L) 09/29/2018 11:40 AM   CHOLHDL 7.6 09/29/2018 11:40 AM   LDLCALC 139 (H) 09/29/2018 11:40 AM   LDLDIRECT 173.0 07/01/2018 11:18 AM    Wt Readings from Last 3 Encounters:  03/16/19 245 lb (111.1 kg)  02/04/19 249 lb (112.9 kg)  02/02/19 249 lb (112.9 kg)     Objective:    Vital Signs:  BP 134/72    Temp 98.4 F (36.9 C)    Wt 245 lb (111.1 kg)    BMI 33.23 kg/m    Since this was a virtual visit I was able to perform a physical exam. He denied any major change in his appearance but there was a 4 pound weight loss There was no audible wheezing He denied any chest wall discomfort to palpation There was no abdominal tenderness His leg swelling had significantly improved He had a normal affect  ASSESSMENT & PLAN:    1. Acute coronary syndrome ([place]): 09/29/2018     2. S/P CABG x 4   3. Hyperlipidemia with target LDL less than 70   4. Hypertension associated with diabetes ([place])   5. Paroxysmal atrial fibrillation ([place])   6. Bilateral lower extremity edema   7. Type 2 diabetes mellitus with complication, without long-term current use of insulin (  [place])   8. Medication management   9. Probable OSA (obstructive sleep apnea)    Mr. Armando is a 73 year old gentleman with a history of hypertension, diabetes mellitus, obesity, as well as paroxysmal arrhythmias for numerous years.  He suffered an acute inferolateral STEMI secondary to ruptured plaque in his mid RCA resulting in distal embolization to the PDA.  Due to extensive CAD he required CABG revascularization surgery.  His postoperative course was complicated by PAF, anemia, orthostatic hypertension, acute kidney injury, and depression.  Presently he is without recurrent ischemic symptoms on his current medical regimen.  Blood pressure today was relatively stable at 134/72.  Apparently he quit taking torsemide 40 mg and has been taking 20 mg every other day.  His edema has improved.  He has not been able to tolerate increasing statin therapy in June 2020 LDL cholesterol was 139.  Most recently he is on Zetia 10 mg and Livalo every other day.  Laboratory [facility] 2 weeks previously by Dr. Yong Channel noted potassium at 5.3.  Glucose was 215. I have recommended follow-up laboratory and if LDL is still elevated would recommend addition of Repatha for PCSK9 inhibition.  He continues to have difficulty with poor sleep but due to the Covid pandemic he does not want to undergo a sleep study presently.  His diabetes mellitus is being monitored by Dr. Loanne Drilling who has been adjusting his medications.  In 4 weeks he will undergo repeat laboratory after trial of increasing Livalo.  I will see him in 3 months for reevaluation.   COVID-19 Education: The signs and symptoms of COVID-19 were discussed with the patient and how to seek care for  testing (follow up with PCP or arrange E-visit).  The importance of social distancing was discussed today.  Time:   Today, I have spent 21 minutes with the patient with telehealth technology discussing the above problems.     Medication Adjustments/Labs and Tests Ordered: Current medicines are reviewed at length with the patient today.  Concerns regarding medicines are outlined above.   Tests Ordered: No orders of the defined types were placed in this encounter.   Medication Changes: No orders of the defined types were placed in this encounter.   Follow Up: 3 months Signed, Shelva Majestic, MD  03/16/2019 3:26 PM    [place]

## 2019-03-23 ENCOUNTER — Telehealth: Payer: Self-pay | Admitting: Cardiovascular Disease

## 2019-03-23 DIAGNOSIS — R262 Difficulty in walking, not elsewhere classified: Secondary | ICD-10-CM | POA: Diagnosis not present

## 2019-03-23 DIAGNOSIS — M6281 Muscle weakness (generalized): Secondary | ICD-10-CM | POA: Diagnosis not present

## 2019-03-23 NOTE — Telephone Encounter (Signed)
Patient is going to the dentist on December 1st and is wondering if he needs an antibiotic before he goes. He is having a crown replacement and teeth cleaning. States his wife's mother had to have an antibiotic before her dental cleanings and she had heart issues as well so that is why he is asking.

## 2019-03-23 NOTE — Telephone Encounter (Signed)
Called patient- advised of message from PharmD. Patient thankful for call, no questions at this time.

## 2019-03-23 NOTE — Telephone Encounter (Signed)
Patient does not need antibiotics prior to dental work.  Guidelines have changed a lot over the years and we don't use abx nearly as often as we once did.   Now mainly for patients with artificial valves.

## 2019-03-23 NOTE — Telephone Encounter (Signed)
Will check with PharmD to see if they will need antibiotics to clarify. Thank you!

## 2019-03-30 ENCOUNTER — Ambulatory Visit (INDEPENDENT_AMBULATORY_CARE_PROVIDER_SITE_OTHER): Payer: PPO | Admitting: Otolaryngology

## 2019-03-30 ENCOUNTER — Encounter (INDEPENDENT_AMBULATORY_CARE_PROVIDER_SITE_OTHER): Payer: Self-pay | Admitting: Otolaryngology

## 2019-03-30 VITALS — Temp 97.3°F

## 2019-03-30 DIAGNOSIS — J310 Chronic rhinitis: Secondary | ICD-10-CM

## 2019-03-30 NOTE — Progress Notes (Signed)
HPI: Martin GANNAWAY Sr. is a 73 y.o. male who returns today for evaluation of allergy type symptoms with congestion and postnasal drainage.  He also complains of some fullness in his ears.  He has had longstanding hearing loss in the right ear he has used Flonase intermittently.  He considered seeing an allergist but wanted to get his sinuses and ears checked. He has history of cardiac disease and cannot take any decongestants.  Past Medical History:  Diagnosis Date  . Diabetes mellitus   . Heart attack ([place]) 10/01/2018   Pt had open heart surgery  . Hypertension   . Tobacco abuse    Past Surgical History:  Procedure Laterality Date  . CORONARY ARTERY BYPASS GRAFT N/A 10/01/2018   Procedure: CORONARY ARTERY BYPASS GRAFTING (CABG) TIMES  FOUR USING LEFT MAMMARY ARTERY AND RIGHT GREATER SAPHEANOUS VEIN HARVESTED ENDOSCOPICALLY;  Surgeon: Melrose Nakayama, MD;  Location: [place];  Service: Open Heart Surgery;  Laterality: N/A;  . HERNIA REPAIR     >10 years ago  . IR RADIOLOGIST EVAL & MGMT  12/24/2016  . IR THORACENTESIS ASP PLEURAL SPACE W/IMG GUIDE  10/06/2018  . LEFT HEART CATH AND CORONARY ANGIOGRAPHY N/A 09/29/2018   Procedure: LEFT HEART CATH AND CORONARY ANGIOGRAPHY;  Surgeon: Troy Sine, MD;  Location: [place] CV LAB;  Service: Cardiovascular;  Laterality: N/A;  . open heart surfery    . right shoulder surgery     around 2014  . TEE WITHOUT CARDIOVERSION N/A 10/01/2018   Procedure: TRANSESOPHAGEAL ECHOCARDIOGRAM (TEE);  Surgeon: Melrose Nakayama, MD;  Location: [place];  Service: Open Heart Surgery;  Laterality: N/A;   Social History   Socioeconomic History  . Marital status: Married    Spouse name: Not on file  . Number of children: Not on file  . Years of education: Not on file  . Highest education level: Not on file  Occupational History  . Not on file  Social Needs  . Financial resource strain: Not on file  . Food insecurity    Worry: Not on file   Inability: Not on file  . Transportation needs    Medical: Not on file    Non-medical: Not on file  Tobacco Use  . Smoking status: Former Smoker    Packs/day: 0.75    Years: 2.00    Pack years: 1.50    Types: Cigarettes    Start date: 2002    Quit date: 04/29/2002    Years since quitting: 16.9  . Smokeless tobacco: Never Used  . Tobacco comment: smoked for 10 years on and off  Substance and Sexual Activity  . Alcohol use: Yes    Alcohol/week: 0.0 standard drinks    Comment: 6 martinis a year  . Drug use: No  . Sexual activity: Yes  Lifestyle  . Physical activity    Days per week: Not on file    Minutes per session: Not on file  . Stress: Not on file  Relationships  . Social Herbalist on phone: Not on file    Gets together: Not on file    Attends religious service: Not on file    Active member of club or organization: Not on file    Attends meetings of clubs or organizations: Not on file    Relationship status: Not on file  Other Topics Concern  . Not on file  Social History Narrative   Married. 3 kids. 7 grandkids. No greatgrandkids.  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 Hypertension Mother   . Heart disease Mother        CHF did not see doctor  . Diabetes Maternal Grandmother   . Diabetes Son    Allergies  Allergen Reactions  . Simvastatin Diarrhea  . Statins Other (See Comments)    achey joints   Prior to Admission medications   Medication Sig Start Date End Date Taking? Authorizing Provider  aspirin 81 MG EC tablet Take 1 tablet (81 mg total) by mouth daily. 02/04/19  Yes Troy Sine, MD  BESIVANCE 0.6 % SUSP Place 1 drop into the left eye 3 (three) times daily. 10/28/18  Yes Hennie Duos, MD  brimonidine (ALPHAGAN) 0.2 % ophthalmic solution Place 1 drop into the left eye 3 (three) times daily. 10/28/18  Yes  Hennie Duos, MD  dorzolamide (TRUSOPT) 2 % ophthalmic solution Place 1 drop into the left eye 3 (three) times daily. 10/28/18  Yes Hennie Duos, MD  ezetimibe (ZETIA) 10 MG tablet Take 1 tablet (10 mg total) by mouth daily. 02/04/19 05/05/19 Yes Troy Sine, MD  feeding supplement, GLUCERNA SHAKE, (GLUCERNA SHAKE) LIQD Take 237 mLs by mouth 3 (three) times daily between meals. 10/19/18  Yes Barrett, Erin R, PA-C  fluticasone (FLONASE) 50 MCG/ACT nasal spray Place 2 sprays into both nostrils daily. 01/06/19  Yes Marin Olp, MD  insulin aspart (NOVOLOG FLEXPEN) 100 UNIT/ML FlexPen Inject 25 Units into the skin 3 (three) times daily with meals. 11/26/18  Yes Renato Shin, MD  losartan (COZAAR) 25 MG tablet Take 1 tablet (25 mg total) by mouth daily. Patient taking differently: Take 12.5 mg by mouth daily.  02/04/19 05/05/19 Yes Troy Sine, MD  metoprolol tartrate (LOPRESSOR) 25 MG tablet TAKE 1/2 TABLET TWICE A DAY 03/16/19  Yes Troy Sine, MD  [facility] VERIO test strip CHECK LEVELS 4 TIMES DAILY 01/25/18  Yes Renato Shin, MD  Pitavastatin Calcium (LIVALO) 1 MG TABS Take 1 tablet (1 mg total) by mouth daily. 02/26/19  Yes Troy Sine, MD  torsemide (DEMADEX) 20 MG tablet Take 2 tablets (40 mg total) by mouth every other day. 03/16/19  Yes Troy Sine, MD     Positive ROS: Otherwise negative  All other systems have been reviewed and were otherwise negative with the exception of those mentioned in the HPI and as above.  Physical Exam: Constitutional: Alert, well-appearing, no acute distress Ears: External ears without lesions or tenderness. Ear canals are clear bilaterally with intact, clear TMs.  He has very poor hearing in the right ear.  No middle ear effusion noted on either side. Nasal: External nose without lesions. Septum relatively midline.  Moderate rhinitis with clear mucus discharge.. Clear nasal passages.  Both middle meatus regions are clear Oral: Lips and  gums without lesions. Tongue and palate mucosa without lesions. Posterior oropharynx clear. Neck: No palpable adenopathy or masses Respiratory: Breathing comfortably  Skin: No facial/neck lesions or rash noted.  Procedures  Assessment: Chronic rhinitis  Plan: Recommended regular use of nasal steroid spray either Flonase or Nasacort. Also gave him samples of Allegra and Xyzal to try. Mucinex or Mucinex DM will also be okay.  He will follow-up as needed.   Radene Journey, MD

## 2019-04-08 ENCOUNTER — Telehealth: Payer: Self-pay | Admitting: Cardiovascular Disease

## 2019-04-08 NOTE — Telephone Encounter (Signed)
New Message  STAT if HR is under 50 or over 120 (normal HR is 60-100 beats per minute)  1) What is your heart rate? 85  2) Do you have a log of your heart rate readings (document readings)? No   3) Do you have any other symptoms?no

## 2019-04-09 ENCOUNTER — Encounter: Payer: Self-pay | Admitting: *Deleted

## 2019-04-09 MED ORDER — METOPROLOL TARTRATE 50 MG PO TABS: 50.0000 mg | ORAL_TABLET | Freq: Two times a day (BID) | ORAL | 3 refills | Status: DC

## 2019-04-09 NOTE — Telephone Encounter (Signed)
This encounter was created in error - please disregard.

## 2019-04-09 NOTE — Telephone Encounter (Signed)
Returned call to pt he states that he is feeling fine this morning he states that his HR 175-180 then it went back to normal after about 2-5 minutes.  It happened in the morning yesterday at that time it lasted just a few minutes and then it went away. 135/75. He states that he has [place] and at that time it said that he had tachycardia. He will email for Dr Claiborne Billings to review as soon as he "can figure it out. But he states that he "feels fine now". He will CB if this happens. He will send the [place] to [place].

## 2019-04-12 ENCOUNTER — Other Ambulatory Visit: Payer: Self-pay | Admitting: Endocrinology

## 2019-04-22 DIAGNOSIS — R262 Difficulty in walking, not elsewhere classified: Secondary | ICD-10-CM | POA: Diagnosis not present

## 2019-04-22 DIAGNOSIS — M6281 Muscle weakness (generalized): Secondary | ICD-10-CM | POA: Diagnosis not present

## 2019-05-01 ENCOUNTER — Other Ambulatory Visit: Payer: Self-pay | Admitting: Cardiovascular Disease

## 2019-05-02 ENCOUNTER — Other Ambulatory Visit: Payer: Self-pay | Admitting: Cardiovascular Disease

## 2019-05-04 ENCOUNTER — Encounter: Payer: Self-pay | Admitting: Family Medicine

## 2019-05-04 ENCOUNTER — Other Ambulatory Visit: Payer: Self-pay

## 2019-05-04 ENCOUNTER — Telehealth: Payer: Self-pay

## 2019-05-04 DIAGNOSIS — E1139 Type 2 diabetes mellitus with other diabetic ophthalmic complication: Secondary | ICD-10-CM

## 2019-05-04 DIAGNOSIS — IMO0002 Reserved for concepts with insufficient information to code with codable children: Secondary | ICD-10-CM

## 2019-05-04 MED ORDER — NOVOLOG FLEXPEN 100 UNIT/ML ~~LOC~~ SOPN: 20.0000 [IU] | PEN_INJECTOR | Freq: Two times a day (BID) | SUBCUTANEOUS | 0 refills | Status: DC

## 2019-05-04 NOTE — Telephone Encounter (Signed)
insulin aspart (NOVOLOG FLEXPEN) 100 UNIT/ML FlexPen 12 mL 0 05/04/2019    Sig - Route: Inject 20 Units into the skin 2 (two) times daily. - Subcutaneous   Sent to pharmacy as: insulin aspart (NOVOLOG FLEXPEN) 100 UNIT/ML FlexPen   E-Prescribing Status: Receipt confirmed by pharmacy (05/04/2019 11:29 AM EST)

## 2019-05-04 NOTE — Telephone Encounter (Signed)
MEDICATION: insulin aspart (NOVOLOG FLEXPEN) 100 UNIT/ML FlexPen  PHARMACY:  [id number] - [place], [place] - [address]  IS THIS A 90 DAY SUPPLY : yes  IS PATIENT OUT OF MEDICATION:   IF NOT; HOW MUCH IS LEFT:   LAST APPOINTMENT DATE: @12 /14/2020  NEXT APPOINTMENT DATE:@1 /29/2021  DO WE HAVE YOUR PERMISSION TO LEAVE A DETAILED MESSAGE:  OTHER COMMENTS:    **Let patient know to contact pharmacy at the end of the day to make sure medication is ready. **  ** Please notify patient to allow 48-72 hours to process**  **Encourage patient to contact the pharmacy for refills or they can request refills through [facility]**

## 2019-05-08 ENCOUNTER — Other Ambulatory Visit: Payer: Self-pay | Admitting: Family Medicine

## 2019-05-11 ENCOUNTER — Ambulatory Visit: Payer: PPO

## 2019-05-23 DIAGNOSIS — R262 Difficulty in walking, not elsewhere classified: Secondary | ICD-10-CM | POA: Diagnosis not present

## 2019-05-23 DIAGNOSIS — M6281 Muscle weakness (generalized): Secondary | ICD-10-CM | POA: Diagnosis not present

## 2019-05-28 ENCOUNTER — Ambulatory Visit (INDEPENDENT_AMBULATORY_CARE_PROVIDER_SITE_OTHER): Payer: PPO | Admitting: Endocrinology

## 2019-05-28 ENCOUNTER — Encounter: Payer: Self-pay | Admitting: Endocrinology

## 2019-05-28 ENCOUNTER — Other Ambulatory Visit: Payer: Self-pay

## 2019-05-28 DIAGNOSIS — E1165 Type 2 diabetes mellitus with hyperglycemia: Secondary | ICD-10-CM

## 2019-05-28 DIAGNOSIS — E1139 Type 2 diabetes mellitus with other diabetic ophthalmic complication: Secondary | ICD-10-CM

## 2019-05-28 DIAGNOSIS — IMO0002 Reserved for concepts with insufficient information to code with codable children: Secondary | ICD-10-CM

## 2019-05-28 MED ORDER — NOVOLOG FLEXPEN 100 UNIT/ML ~~LOC~~ SOPN: PEN_INJECTOR | SUBCUTANEOUS | 0 refills | Status: DC

## 2019-05-28 MED ORDER — LANTUS SOLOSTAR 100 UNIT/ML ~~LOC~~ SOPN: 10.0000 [IU] | PEN_INJECTOR | Freq: Every day | SUBCUTANEOUS | 99 refills | Status: DC

## 2019-05-28 NOTE — Progress Notes (Signed)
Subjective:    Patient ID: Martin Agee Sr., male    DOB: Feb 21, 1946, 74 y.o.   MRN: [id number]  HPI telehealth visit today via doxy video visit.  Alternatives to telehealth are presented to this patient, and the patient agrees to the telehealth visit. Pt is advised of the cost of the visit, and agrees to this, also.   Patient is at home, and I am at the office.   Persons attending the telehealth visit: the patient and I Pt returns for f/u of diabetes mellitus:  DM type: Insulin-requiring type 2.  Dx'ed: [id number] Complications: polyneuropathy, foot ulcer, renal insuff, PAD, CAD, and DR.  Therapy: insulin since 2013.  DKA: never.   Severe hypoglycemia: never.  Pancreatitis: never.   Other: he takes multiple daily injections; he declines weight loss surgery; he eats 2-3 meals per day.     Interval history: Pt says cbg varies from 60-236 (avg 172).  It is in general highest fasting, and lowest in the afternoon.  He is feeling steadily better.  He says he never misses the insulin.  He takes Novolog PC rather than AC.   Past Medical History:  Diagnosis Date  . Diabetes mellitus   . Heart attack ([place]) 10/01/2018   Pt had open heart surgery  . Hypertension   . Tobacco abuse     Past Surgical History:  Procedure Laterality Date  . CORONARY ARTERY BYPASS GRAFT N/A 10/01/2018   Procedure: CORONARY ARTERY BYPASS GRAFTING (CABG) TIMES  FOUR USING LEFT MAMMARY ARTERY AND RIGHT GREATER SAPHEANOUS VEIN HARVESTED ENDOSCOPICALLY;  Surgeon: Melrose Nakayama, MD;  Location: [facility];  Service: Open Heart Surgery;  Laterality: N/A;  . HERNIA REPAIR     >10 years ago  . IR RADIOLOGIST EVAL & MGMT  12/24/2016  . IR THORACENTESIS ASP PLEURAL SPACE W/IMG GUIDE  10/06/2018  . LEFT HEART CATH AND CORONARY ANGIOGRAPHY N/A 09/29/2018   Procedure: LEFT HEART CATH AND CORONARY ANGIOGRAPHY;  Surgeon: Troy Sine, MD;  Location: [facility];  Service: Cardiovascular;  Laterality: N/A;  . open heart  surfery    . right shoulder surgery     around 2014  . TEE WITHOUT CARDIOVERSION N/A 10/01/2018   Procedure: TRANSESOPHAGEAL ECHOCARDIOGRAM (TEE);  Surgeon: Melrose Nakayama, MD;  Location: [facility];  Service: Open Heart Surgery;  Laterality: N/A;    Social History   Socioeconomic History  . Marital status: Married    Spouse name: Not on file  . Number of children: Not on file  . Years of education: Not on file  . Highest education level: Not on file  Occupational History  . Not on file  Tobacco Use  . Smoking status: Former Smoker    Packs/day: 0.75    Years: 2.00    Pack years: 1.50    Types: Cigarettes    Start date: 2002    Quit date: 04/29/2002    Years since quitting: 17.0  . Smokeless tobacco: Never Used  . Tobacco comment: smoked for 10 years on and off  Substance and Sexual Activity  . Alcohol use: Yes    Alcohol/week: 0.0 standard drinks    Comment: 6 martinis a year  . Drug use: No  . Sexual activity: Yes  Other Topics Concern  . Not on file  Social History Narrative   Married. 3 kids. 7 grandkids. No greatgrandkids.       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 Difficulty of Paying Living Expenses: Not on file  Food Insecurity:   . Worried About Charity fundraiser in the Last Year: Not on file  . Ran Out of Food in the Last Year: Not on file  Transportation Needs:   . Lack of Transportation (Medical): Not on file  . Lack of Transportation (Non-Medical): Not on file  Physical Activity:   . Days of Exercise per Week: Not on file  . Minutes of Exercise per Session: Not on file  Stress:   . Feeling of Stress : Not on file  Social Connections:   . Frequency of Communication with Friends and Family: Not on file  . Frequency of Social Gatherings with Friends and Family: Not on file  . Attends Religious Services: Not on  file  . Active Member of Clubs or Organizations: Not on file  . Attends Archivist Meetings: Not on file  . Marital Status: Not on file  Intimate Partner Violence:   . Fear of Current or Ex-Partner: Not on file  . Emotionally Abused: Not on file  . Physically Abused: Not on file  . Sexually Abused: Not on file    Current Outpatient Medications on File Prior to Visit  Medication Sig Dispense Refill  . aspirin 81 MG EC tablet Take 1 tablet (81 mg total) by mouth daily.    Marland Kitchen BESIVANCE 0.6 % SUSP Place 1 drop into the left eye 3 (three) times daily. 5 mL 0  . brimonidine (ALPHAGAN) 0.2 % ophthalmic solution Place 1 drop into the left eye 3 (three) times daily. 5 mL 0  . dorzolamide (TRUSOPT) 2 % ophthalmic solution Place 1 drop into the left eye 3 (three) times daily. 10 mL 0  . ezetimibe (ZETIA) 10 MG tablet TAKE 1 TABLET BY MOUTH EVERY DAY 90 tablet 3  . feeding supplement, GLUCERNA SHAKE, (GLUCERNA SHAKE) LIQD Take 237 mLs by mouth 3 (three) times daily between meals.  0  . fluticasone (FLONASE) 50 MCG/ACT nasal spray SPRAY 2 SPRAYS INTO EACH NOSTRIL EVERY DAY 48 mL 1  . losartan (COZAAR) 25 MG tablet TAKE 1 TABLET BY MOUTH EVERY DAY 90 tablet 1  . metoprolol tartrate (LOPRESSOR) 50 MG tablet Take 1 tablet (50 mg total) by mouth 2 (two) times daily. 180 tablet 3  . ONETOUCH VERIO test strip CHECK LEVELS 4 TIMES DAILY 360 each 3  . Pitavastatin Calcium (LIVALO) 1 MG TABS Take 1 tablet (1 mg total) by mouth daily. 30 tablet 0  . torsemide (DEMADEX) 20 MG tablet TAKE 2 TABLETS BY MOUTH EVERY DAY 180 tablet 3   No current facility-administered medications on file prior to visit.    Allergies  Allergen Reactions  . Simvastatin Diarrhea  . Statins Other (See Comments)    achey joints    Family History  Problem Relation Age of Onset  . Hypertension Mother   . Heart disease Mother        CHF did not see doctor  . Diabetes Maternal Grandmother   . Diabetes Son     There  were no vitals taken for this visit.   Review of Systems Denies LOC    Objective:   Physical Exam   Lab Results  Component Value Date   CREATININE 1.24 03/04/2019   BUN 15 03/04/2019   NA 138 03/04/2019   K 5.3 (H) 03/04/2019  CL 98 03/04/2019   CO2 24 03/04/2019       Assessment & Plan:  Insulin-requiring type 2 DM, with PAD: he is ready to resume basal insulin Hypoglycemia: this limits aggressiveness of glycemic control.  This may get better with taking insulin AC rather than PC Renal insuff: in this setting, he still needs mostly mealtime insulin.   Patient Instructions  Please resume the Lantus 10 units at bedtime, and:  reduce the Novolog to 3 times a day (just before each meal), 25-20-25 units.   check your blood sugar twice a day.  vary the time of day when you check, between before the 3 meals, and at bedtime.  also check if you have symptoms of your blood sugar being too high or too low.  please keep a record of the readings and bring it to your next appointment here (or you can bring the meter itself).  You can write it on any piece of paper.  please call us sooner if your blood sugar goes below 70, or if you have a lot of readings over 200.   Please have another video visit in 3 weeks.

## 2019-05-28 NOTE — Patient Instructions (Addendum)
Please resume the Lantus 10 units at bedtime, and:  reduce the Novolog to 3 times a day (just before each meal), 25-20-25 units.   check your blood sugar twice a day.  vary the time of day when you check, between before the 3 meals, and at bedtime.  also check if you have symptoms of your blood sugar being too high or too low.  please keep a record of the readings and bring it to your next appointment here (or you can bring the meter itself).  You can write it on any piece of paper.  please call us sooner if your blood sugar goes below 70, or if you have a lot of readings over 200.   Please have another video visit in 3 weeks.

## 2019-06-23 DIAGNOSIS — R262 Difficulty in walking, not elsewhere classified: Secondary | ICD-10-CM | POA: Diagnosis not present

## 2019-06-23 DIAGNOSIS — M6281 Muscle weakness (generalized): Secondary | ICD-10-CM | POA: Diagnosis not present

## 2019-07-05 ENCOUNTER — Other Ambulatory Visit: Payer: Self-pay | Admitting: Endocrinology

## 2019-07-05 NOTE — Telephone Encounter (Signed)
Please advise 

## 2019-07-06 ENCOUNTER — Telehealth: Payer: Self-pay | Admitting: Family Medicine

## 2019-07-06 NOTE — Telephone Encounter (Signed)
I left a message asking the patient and spouse to call and schedule Medicare AWV with Loma Sousa ([place]) while here to see Dr. Yong Channel on 07/08/19.  .  I'm waiting for a call back to either confirm or decline the appointment. If patient calls back, please update appointment notes.  VDM (Dee-Dee)

## 2019-07-08 ENCOUNTER — Telehealth: Payer: Self-pay | Admitting: Endocrinology

## 2019-07-08 ENCOUNTER — Telehealth: Payer: Self-pay

## 2019-07-08 ENCOUNTER — Ambulatory Visit (INDEPENDENT_AMBULATORY_CARE_PROVIDER_SITE_OTHER): Payer: PPO | Admitting: Family Medicine

## 2019-07-08 ENCOUNTER — Ambulatory Visit: Payer: PPO

## 2019-07-08 ENCOUNTER — Other Ambulatory Visit: Payer: Self-pay

## 2019-07-08 ENCOUNTER — Encounter: Payer: Self-pay | Admitting: Family Medicine

## 2019-07-08 VITALS — BP 148/82 | HR 80 | Temp 98.1°F | Ht 72.0 in | Wt 251.0 lb

## 2019-07-08 DIAGNOSIS — Z1211 Encounter for screening for malignant neoplasm of colon: Secondary | ICD-10-CM | POA: Diagnosis not present

## 2019-07-08 DIAGNOSIS — I739 Peripheral vascular disease, unspecified: Secondary | ICD-10-CM

## 2019-07-08 DIAGNOSIS — IMO0002 Reserved for concepts with insufficient information to code with codable children: Secondary | ICD-10-CM

## 2019-07-08 DIAGNOSIS — E1169 Type 2 diabetes mellitus with other specified complication: Secondary | ICD-10-CM | POA: Diagnosis not present

## 2019-07-08 DIAGNOSIS — E1139 Type 2 diabetes mellitus with other diabetic ophthalmic complication: Secondary | ICD-10-CM

## 2019-07-08 DIAGNOSIS — I9789 Other postprocedural complications and disorders of the circulatory system, not elsewhere classified: Secondary | ICD-10-CM

## 2019-07-08 DIAGNOSIS — I723 Aneurysm of iliac artery: Secondary | ICD-10-CM

## 2019-07-08 DIAGNOSIS — I2510 Atherosclerotic heart disease of native coronary artery without angina pectoris: Secondary | ICD-10-CM

## 2019-07-08 DIAGNOSIS — Z0000 Encounter for general adult medical examination without abnormal findings: Secondary | ICD-10-CM | POA: Diagnosis not present

## 2019-07-08 DIAGNOSIS — I700 Atherosclerosis of aorta: Secondary | ICD-10-CM

## 2019-07-08 DIAGNOSIS — E11311 Type 2 diabetes mellitus with unspecified diabetic retinopathy with macular edema: Secondary | ICD-10-CM

## 2019-07-08 DIAGNOSIS — E1165 Type 2 diabetes mellitus with hyperglycemia: Secondary | ICD-10-CM

## 2019-07-08 DIAGNOSIS — I152 Hypertension secondary to endocrine disorders: Secondary | ICD-10-CM

## 2019-07-08 DIAGNOSIS — I10 Essential (primary) hypertension: Secondary | ICD-10-CM | POA: Diagnosis not present

## 2019-07-08 DIAGNOSIS — E1142 Type 2 diabetes mellitus with diabetic polyneuropathy: Secondary | ICD-10-CM

## 2019-07-08 DIAGNOSIS — E785 Hyperlipidemia, unspecified: Secondary | ICD-10-CM

## 2019-07-08 DIAGNOSIS — E1159 Type 2 diabetes mellitus with other circulatory complications: Secondary | ICD-10-CM | POA: Diagnosis not present

## 2019-07-08 DIAGNOSIS — I4891 Unspecified atrial fibrillation: Secondary | ICD-10-CM

## 2019-07-08 LAB — COMPREHENSIVE METABOLIC PANEL
ALT: 14 U/L (ref 0–53)
AST: 16 U/L (ref 0–37)
Albumin: 4.2 g/dL (ref 3.5–5.2)
Alkaline Phosphatase: 55 U/L (ref 39–117)
BUN: 25 mg/dL — ABNORMAL HIGH (ref 6–23)
CO2: 29 mEq/L (ref 19–32)
Calcium: 9.4 mg/dL (ref 8.4–10.5)
Chloride: 102 mEq/L (ref 96–112)
Creatinine, Ser: 1.13 mg/dL (ref 0.40–1.50)
GFR: 63.28 mL/min (ref >60.00)
Glucose, Bld: 167 mg/dL — ABNORMAL HIGH (ref 70–99)
Potassium: 5.1 mEq/L (ref 3.5–5.1)
Sodium: 137 mEq/L (ref 135–145)
Total Bilirubin: 1 mg/dL (ref 0.2–1.2)
Total Protein: 7.8 g/dL (ref 6.0–8.3)

## 2019-07-08 LAB — LIPID PANEL
Cholesterol: 132 mg/dL (ref 0–200)
HDL: 46.1 mg/dL (ref >39.00)
LDL Cholesterol: 71 mg/dL (ref 0–99)
NonHDL: 85.54
Total CHOL/HDL Ratio: 3
Triglycerides: 75 mg/dL (ref 0.0–149.0)
VLDL: 15 mg/dL (ref 0.0–40.0)

## 2019-07-08 LAB — HEMOGLOBIN A1C: Hgb A1c MFr Bld: 7.6 % — ABNORMAL HIGH (ref 4.6–6.5)

## 2019-07-08 LAB — CBC
HCT: 36.5 % — ABNORMAL LOW (ref 39.0–52.0)
Hemoglobin: 12.1 g/dL — ABNORMAL LOW (ref 13.0–17.0)
MCHC: 33 g/dL (ref 30.0–36.0)
MCV: 86.5 fl (ref 78.0–100.0)
Platelets: 320 10*3/uL (ref 150.0–400.0)
RBC: 4.22 Mil/uL (ref 4.22–5.81)
RDW: 15.7 % — ABNORMAL HIGH (ref 11.5–15.5)
WBC: 9 10*3/uL (ref 4.0–10.5)

## 2019-07-08 MED ORDER — LIVALO 1 MG PO TABS: 1.0000 mg | ORAL_TABLET | Freq: Every day | ORAL | 3 refills | Status: DC

## 2019-07-08 NOTE — Progress Notes (Signed)
Phone: [phone]   Subjective:  Patient presents today for their annual physical. Chief complaint-noted.   See problem oriented charting- ROS- full  review of systems was completed and negative  except for: some fatigue, some weight gain  The following were reviewed and entered/updated in epic: Past Medical History:  Diagnosis Date  . Diabetes mellitus   . Heart attack ([place]) 10/01/2018   Pt had open heart surgery  . Hypertension   . Tobacco abuse    Patient Active Problem List   Diagnosis Date Noted  . Postoperative atrial fibrillation ([place]) 10/24/2018    Priority: High  . 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 Undiagnosed cardiac murmurs 08/15/2015    Priority: High  . DM (diabetes mellitus) type II uncontrolled with eye manifestation ([place]) 02/03/2007    Priority: High  . PAD (peripheral artery disease) ([place]) 08/20/2017    Priority: Medium  . Partial tear of right Achilles tendon 02/12/2017    Priority: Medium  . Aortic atherosclerosis ([place]) 10/07/2016    Priority: Medium  . History of skin cancer 08/15/2015    Priority: Medium  . Diabetic retinopathy ([place]) 02/21/2015    Priority: Medium  . Hyperlipidemia associated with type 2 diabetes mellitus ([place]) 08/24/2014    Priority: Medium  . Diabetic polyneuropathy ([place]) 04/11/2009    Priority: Medium  . Hypertension associated with diabetes ([place]) 02/03/2007    Priority: Medium  . Acute blood loss as cause of postoperative anemia 10/24/2018    Priority: Low  . BPH (benign prostatic hyperplasia) 10/24/2018    Priority: Low  . History of depression 10/24/2018    Priority: Low  . S/P CABG x 4 10/01/2018    Priority: Low  . Onychomycosis of toenail 02/17/2018    Priority: Low  . Allergic rhinitis 08/20/2017    Priority: Low  . C. difficile colitis 09/25/2016    Priority: Low  . History of aspiration pneumonia 09/25/2016    Priority: Low  . Former smoker 08/24/2014   Priority: Low  . Ingrowing toenail 07/19/2014    Priority: Low  . BACK PAIN, LUMBAR, WITH RADICULOPATHY 08/30/2008    Priority: Low   Past Surgical History:  Procedure Laterality Date  . CORONARY ARTERY BYPASS GRAFT N/A 10/01/2018   Procedure: CORONARY ARTERY BYPASS GRAFTING (CABG) TIMES  FOUR USING LEFT MAMMARY ARTERY AND RIGHT GREATER SAPHEANOUS VEIN HARVESTED ENDOSCOPICALLY;  Surgeon: Melrose Nakayama, MD;  Location: [facility];  Service: Open Heart Surgery;  Laterality: N/A;  . HERNIA REPAIR     >10 years ago  . IR RADIOLOGIST EVAL & MGMT  12/24/2016  . IR THORACENTESIS ASP PLEURAL SPACE W/IMG GUIDE  10/06/2018  . LEFT HEART CATH AND CORONARY ANGIOGRAPHY N/A 09/29/2018   Procedure: LEFT HEART CATH AND CORONARY ANGIOGRAPHY;  Surgeon: Troy Sine, MD;  Location: [facility];  Service: Cardiovascular;  Laterality: N/A;  . open heart surfery    . right shoulder surgery     around 2014  . TEE WITHOUT CARDIOVERSION N/A 10/01/2018   Procedure: TRANSESOPHAGEAL ECHOCARDIOGRAM (TEE);  Surgeon: Melrose Nakayama, MD;  Location: [facility];  Service: Open Heart Surgery;  Laterality: N/A;    Family History  Problem Relation Age of Onset  . Hypertension Mother   . Heart disease Mother        CHF did not see doctor  . Diabetes Maternal Grandmother   . Diabetes Son     Medications- reviewed  and updated Current Outpatient Medications  Medication Sig Dispense Refill  . aspirin 81 MG EC tablet Take 1 tablet (81 mg total) by mouth daily.    Marland Kitchen BESIVANCE 0.6 % SUSP Place 1 drop into the left eye 3 (three) times daily. 5 mL 0  . brimonidine (ALPHAGAN) 0.2 % ophthalmic solution Place 1 drop into the left eye 3 (three) times daily. 5 mL 0  . dorzolamide (TRUSOPT) 2 % ophthalmic solution Place 1 drop into the left eye 3 (three) times daily. 10 mL 0  . ezetimibe (ZETIA) 10 MG tablet TAKE 1 TABLET BY MOUTH EVERY DAY 90 tablet 3  . feeding supplement, GLUCERNA SHAKE, (GLUCERNA SHAKE) LIQD Take 237  mLs by mouth 3 (three) times daily between meals.  0  . fluticasone (FLONASE) 50 MCG/ACT nasal spray SPRAY 2 SPRAYS INTO EACH NOSTRIL EVERY DAY 48 mL 1  . insulin aspart (NOVOLOG FLEXPEN) 100 UNIT/ML FlexPen 3 times a day (just before each meal) 25-20-25 units 12 mL 0  . Insulin Glargine (LANTUS SOLOSTAR) 100 UNIT/ML Solostar Pen Inject 10 Units into the skin daily. 5 pen PRN  . Insulin Pen Needle (NOVOFINE) 32G X 6 MM MISC USE 5 TIMES DAILY 450 each 2  . losartan (COZAAR) 25 MG tablet TAKE 1 TABLET BY MOUTH EVERY DAY 90 tablet 1  . metoprolol tartrate (LOPRESSOR) 50 MG tablet Take 1 tablet (50 mg total) by mouth 2 (two) times daily. 180 tablet 3  . ONETOUCH VERIO test strip CHECK LEVELS 4 TIMES DAILY 150 strip 1  . Pitavastatin Calcium (LIVALO) 1 MG TABS Take 1 tablet (1 mg total) by mouth daily. 90 tablet 3  . torsemide (DEMADEX) 20 MG tablet TAKE 2 TABLETS BY MOUTH EVERY DAY 180 tablet 3   No current facility-administered medications for this visit.    Allergies-reviewed and updated Allergies  Allergen Reactions  . Simvastatin Diarrhea  . Statins Other (See Comments)    achey joints    Social History   Social History Narrative   Married. 3 kids. 7 grandkids. No greatgrandkids.       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 TM normal. Bridge of nose normal. Eyelids normal.  Neck: no thyromegaly or cervical lymphadenopathy  CV: RRR stable murmurs. no rubs or gallops Lungs: CTAB no crackles, wheeze, rhonchi Abdomen: soft/nontender/nondistended/normal bowel sounds. Diastasis recti noted Ext: 1+ edema Skin: warm,  dry Neuro: grossly normal, moves all extremities, PERRLA  Diabetic Foot Exam - Simple   Simple Foot Form Diabetic Foot exam was performed with the following findings: Yes 07/08/2019 10:02 AM  Visual Inspection No deformities, no ulcerations, no other skin breakdown bilaterally: Yes Sensation Testing Intact to touch and monofilament testing bilaterally: Yes Pulse Check Posterior Tibialis and Dorsalis pulse intact bilaterally: Yes Comments Onychomycosis       Assessment and Plan  74 y.o. male presenting for annual physical.  Health Maintenance counseling: 1. Anticipatory guidance: Patient counseled regarding regular dental exams - q6 months typically but needs to get back on  schedule after covid, eye exams yes yearly,  avoiding smoking and second hand smoke , limiting alcohol to 0  beverages per day.   2. Risk factor reduction:  Advised patient of need for regular exercise and diet rich and fruits and vegetables to reduce risk of heart attack and stroke. Exercise- limited due to weather but walks now since weather is changing- encouraged increasing exercise. Diet-cooks at home. Weight slipping up - he feels like he lost a lot of muscle with prior CABG- doing some 2 lb weight.  Wt Readings from Last 3 Encounters:  07/08/19 251 lb (113.9 kg)  03/16/19 245 lb (111.1 kg)  02/04/19 249 lb (112.9 kg)  3. Immunizations/screenings/ancillary studies- up to date other than shingris Immunization History  Administered Date(s) Administered  . Fluad Quad(high Dose 65+) 01/06/2019  . Influenza Split 02/06/2011, 01/28/2012  . Influenza Whole 02/08/2010  . Influenza, High Dose Seasonal PF 02/14/2016, 02/12/2017, 02/17/2018  . Influenza,inj,Quad PF,6+ Mos 02/21/2015  . Moderna SARS-COVID-2 Vaccination 05/30/2019, 06/27/2019  . Pneumococcal Conjugate-13 08/24/2014  . Pneumococcal Polysaccharide-23 07/18/2008, 08/15/2015  . Td 04/29/2004  . Tdap 08/24/2014  . Zoster 02/08/2010  4. Prostate cancer  screening- typically we dont screen past age 85- no new urinary symptoms  Lab Results  Component Value Date   PSA 0.38 08/08/2015   PSA 0.42 04/05/2014   PSA 0.28 09/02/2012   5. Colon cancer screening - incomplete result on cologuard was having loose BMs then but things are better. He is willing to retrial 6. Skin cancer screening- [facility]- needs updated exam. advised regular sunscreen use. Denies worrisome, changing, or new skin lesions.  7. Never smoker  Status of chronic or acute concerns   Hypertension associated with diabetes ([place]) - Plan: Comprehensive metabolic panel, CBC -controlled at home on losartan 25 mg (half tablet), metoprolol 50mg  (actually 25mg  as takes half)  BID, torsemide 1 tablet about every 3 days. Home #s 120s or 130s. Has not taken AM meds so that is likely reason slightly hight today- he will take when gets home  Hyperlipidemia associated with type 2 diabetes mellitus ([place]) - Plan: Lipid Panel - on zetia and livalo. Mild myalgias. Update lipid panel.  Lab Results  Component Value Date   CHOL 205 (H) 09/29/2018   HDL 27 (L) 09/29/2018   LDLCALC 139 (H) 09/29/2018   LDLDIRECT 173.0 07/01/2018   TRIG 195 (H) 09/29/2018   CHOLHDL 7.6 09/29/2018    DM (diabetes mellitus) type II uncontrolled with eye manifestation ([facility]) - Plan: Hemoglobin A1c, Comprehensive metabolic panel, Lipid Panel, CBC - working with Dr. 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 Update lipids today as aboe.  - no chest pain other than where chest wall did not heal correctly (heart surgeon is aware) -considering OSA testing  PAD (peripheral artery disease) ([facility]) -mild disease. Continue risk factor modification.   Aortic atherosclerosis ([place]) -continue to work on minimizing current medicines. 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 Preventative health care  Z00.00 Hemoglobin A1c    Comprehensive metabolic panel    Lipid Panel    CBC  2. Hyperlipidemia associated with type 2 diabetes mellitus ([facility])  E11.69 Lipid Panel   E78.5   3. DM (diabetes mellitus) type II uncontrolled with eye manifestation ([facility])  E11.39 Hemoglobin A1c   E11.65 Comprehensive metabolic panel    Lipid Panel    CBC  4. Postoperative atrial fibrillation ([facility])  I97.89    I48.91   5. PAD (peripheral artery disease) ([facility])  I73.9   6. 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 Diabetic polyneuropathy associated with type 2 diabetes mellitus ([facility])  E11.42   8. Aortic atherosclerosis ([facility])  I70.0   9. Hypertension associated with diabetes ([place])  E11.59 Comprehensive metabolic panel   99991111 CBC  10. Screen for colon cancer  Z12.11 Cologuard  11. Coronary artery disease involving native coronary artery of native heart without angina pectoris  I25.10   12. 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 Pitavastatin Calcium (LIVALO) 1 MG TABS    Sig: Take 1 tablet (1 mg total) by mouth daily.    Dispense:  90  tablet    Refill:  3    Return precautions advised.  [name], MD

## 2019-07-08 NOTE — Addendum Note (Signed)
Addended by: Francis Dowse T on: 07/08/2019 11:47 AM   Modules accepted: Orders

## 2019-07-08 NOTE — Assessment & Plan Note (Signed)
No obvious recurrence. On metoprolol per cardiology- no longer on amiodarone. On aspirin alone- once again defer anticoagulation decision to cardiology- apears they think risk is low

## 2019-07-08 NOTE — Patient Instructions (Addendum)
Health Maintenance Due  Topic Date Due  . COLONOSCOPY -be on lookout for cologuard 09/21/2017  . OPHTHALMOLOGY EXAM - yesterday- we will look out for records 06/04/2019   Lets get you back on exercise. I would probably build up even slower than your wife. Probably 5-10 minutes at a time at first with long term goal of 30 minutes. I like your idea of aerodyne bike as well.   At least a month after covid vaccine- Please check with your pharmacy to see if they have the shingrix vaccine. If they do- please get this immunization and update [place] by phone call or mychart with dates you receive the vaccine  Recommended follow up:  6 months  Please stop by lab before you go If you do not have mychart- we will call you about results within 5 business days of [place] receiving them.  If you have mychart- we will send your results within 3 business days of [place] receiving them.  If abnormal or we want to clarify a result, we will call or mychart you to make sure you receive the message.  If you have questions or concerns or don't hear within 5 business days, please send [place] a message or call us.

## 2019-07-08 NOTE — Telephone Encounter (Signed)
Left message to return call to our office.  

## 2019-07-08 NOTE — Telephone Encounter (Signed)
1.  Please schedule f/u appt 2.  Then please refill x 1, pending that appt.  

## 2019-07-08 NOTE — Telephone Encounter (Signed)
Please advise 

## 2019-07-08 NOTE — Assessment & Plan Note (Signed)
Stable 2019- update with [place] at this time

## 2019-07-08 NOTE — Telephone Encounter (Signed)
Patient wife returning missed call

## 2019-07-08 NOTE — Telephone Encounter (Signed)
Patient wife would like to know  What address will Dr. Yong Channel be sending the cologuard. Patient have PO [address] on file

## 2019-07-09 ENCOUNTER — Other Ambulatory Visit: Payer: Self-pay

## 2019-07-09 DIAGNOSIS — E1139 Type 2 diabetes mellitus with other diabetic ophthalmic complication: Secondary | ICD-10-CM

## 2019-07-09 DIAGNOSIS — IMO0002 Reserved for concepts with insufficient information to code with codable children: Secondary | ICD-10-CM

## 2019-07-09 MED ORDER — NOVOLOG FLEXPEN 100 UNIT/ML ~~LOC~~ SOPN: PEN_INJECTOR | SUBCUTANEOUS | 0 refills | Status: DC

## 2019-07-09 MED ORDER — NOVOLOG FLEXPEN 100 UNIT/ML ~~LOC~~ SOPN: PEN_INJECTOR | SUBCUTANEOUS | 2 refills | Status: DC

## 2019-07-09 NOTE — Telephone Encounter (Signed)
Virtual visit scheduled for 07/13/2019 and he had labs done yesterday that included CBC, CMP, Lipid Panel, and A1C. These are on his chart.

## 2019-07-09 NOTE — Telephone Encounter (Signed)
Left message to return call to our office.  

## 2019-07-09 NOTE — Telephone Encounter (Signed)
Outpatient Medication Detail   Disp Refills Start End   insulin aspart (NOVOLOG FLEXPEN) 100 UNIT/ML FlexPen 21 mL 2 07/09/2019    Sig: 3 times a day (just before each meal) 25-20-25 units   Sent to pharmacy as: insulin aspart (NOVOLOG FLEXPEN) 100 UNIT/ML FlexPen   E-Prescribing Status: Receipt confirmed by pharmacy (07/09/2019 11:27 AM EST)

## 2019-07-13 ENCOUNTER — Ambulatory Visit: Payer: PPO | Admitting: Endocrinology

## 2019-07-13 ENCOUNTER — Ambulatory Visit (INDEPENDENT_AMBULATORY_CARE_PROVIDER_SITE_OTHER): Payer: PPO | Admitting: Endocrinology

## 2019-07-13 ENCOUNTER — Other Ambulatory Visit: Payer: Self-pay

## 2019-07-13 DIAGNOSIS — E1139 Type 2 diabetes mellitus with other diabetic ophthalmic complication: Secondary | ICD-10-CM

## 2019-07-13 DIAGNOSIS — E1165 Type 2 diabetes mellitus with hyperglycemia: Secondary | ICD-10-CM

## 2019-07-13 DIAGNOSIS — IMO0002 Reserved for concepts with insufficient information to code with codable children: Secondary | ICD-10-CM

## 2019-07-13 MED ORDER — NOVOLOG FLEXPEN 100 UNIT/ML ~~LOC~~ SOPN: PEN_INJECTOR | SUBCUTANEOUS | 3 refills | Status: DC

## 2019-07-13 MED ORDER — LANTUS SOLOSTAR 100 UNIT/ML ~~LOC~~ SOPN: 10.0000 [IU] | PEN_INJECTOR | Freq: Every day | SUBCUTANEOUS | 99 refills | Status: DC

## 2019-07-13 NOTE — Progress Notes (Signed)
Subjective:    Patient ID: Martin Agee Sr., male    DOB: 1945-06-30, 74 y.o.   MRN: [id number]  HPI telehealth visit today via doxy video visit.  Alternatives to telehealth are presented to this patient, and the patient agrees to the telehealth visit.  Pt is advised of the cost of the visit, and agrees to this, also.   Patient is at home, and I am at the office.   Persons attending the telehealth visit: the patient, wife, and I Pt returns for f/u of diabetes mellitus:  DM type: Insulin-requiring type 2.  Dx'ed: 123XX123 Complications: PN, foot ulcer, CRI, PAD, CAD, and DR.  Therapy: insulin since 2013.  DKA: never.   Severe hypoglycemia: never.  Pancreatitis: never.   Other: he takes multiple daily injections; he declines weight loss surgery; he eats 2-3 meals per day.   Interval history: Pt says cbg varies from 67-236.  It is still in general highest fasting (higher than at hs), and lowest in the afternoon.  pt states he feels well in general.  He says he never misses the insulin.  Past Medical History:  Diagnosis Date  . Diabetes mellitus   . Heart attack ([place]) 10/01/2018   Pt had open heart surgery  . Hypertension   . Tobacco abuse     Past Surgical History:  Procedure Laterality Date  . CORONARY ARTERY BYPASS GRAFT N/A 10/01/2018   Procedure: CORONARY ARTERY BYPASS GRAFTING (CABG) TIMES  FOUR USING LEFT MAMMARY ARTERY AND RIGHT GREATER SAPHEANOUS VEIN HARVESTED ENDOSCOPICALLY;  Surgeon: Melrose Nakayama, MD;  Location: [facility];  Service: Open Heart Surgery;  Laterality: N/A;  . HERNIA REPAIR     >10 years ago  . IR RADIOLOGIST EVAL & MGMT  12/24/2016  . IR THORACENTESIS ASP PLEURAL SPACE W/IMG GUIDE  10/06/2018  . LEFT HEART CATH AND CORONARY ANGIOGRAPHY N/A 09/29/2018   Procedure: LEFT HEART CATH AND CORONARY ANGIOGRAPHY;  Surgeon: Troy Sine, MD;  Location: [facility];  Service: Cardiovascular;  Laterality: N/A;  . open heart surfery    . right shoulder surgery      around 2014  . TEE WITHOUT CARDIOVERSION N/A 10/01/2018   Procedure: TRANSESOPHAGEAL ECHOCARDIOGRAM (TEE);  Surgeon: Melrose Nakayama, MD;  Location: [facility];  Service: Open Heart Surgery;  Laterality: N/A;    Social History   Socioeconomic History  . Marital status: Married    Spouse name: Not on file  . Number of children: Not on file  . Years of education: Not on file  . Highest education level: Not on file  Occupational History  . Not on file  Tobacco Use  . Smoking status: Former Smoker    Packs/day: 0.75    Years: 2.00    Pack years: 1.50    Types: Cigarettes    Start date: 2002    Quit date: 04/29/2002    Years since quitting: 17.2  . Smokeless tobacco: Never Used  . Tobacco comment: smoked for 10 years on and off  Substance and Sexual Activity  . Alcohol use: Yes    Alcohol/week: 0.0 standard drinks    Comment: 6 martinis a year  . Drug use: No  . Sexual activity: Yes  Other Topics Concern  . Not on file  Social History Narrative   Married. 3 kids. 7 grandkids. No greatgrandkids.       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 Difficulty of Paying Living Expenses:   Food Insecurity:   . Worried About Charity fundraiser in the Last Year:   . Arboriculturist in the Last Year:   Transportation Needs:   . Film/video editor (Medical):   Marland Kitchen Lack of Transportation (Non-Medical):   Physical Activity:   . Days of Exercise per Week:   . Minutes of Exercise per Session:   Stress:   . Feeling of Stress :   Social Connections:   . Frequency of Communication with Friends and Family:   . Frequency of Social Gatherings with Friends and Family:   . Attends Religious Services:   . Active Member of Clubs or Organizations:   . Attends Archivist Meetings:   Marland Kitchen Marital Status:   Intimate Partner Violence:   . Fear of  Current or Ex-Partner:   . Emotionally Abused:   Marland Kitchen Physically Abused:   . Sexually Abused:     Current Outpatient Medications on File Prior to Visit  Medication Sig Dispense Refill  . aspirin 81 MG EC tablet Take 1 tablet (81 mg total) by mouth daily.    Marland Kitchen BESIVANCE 0.6 % SUSP Place 1 drop into the left eye 3 (three) times daily. 5 mL 0  . brimonidine (ALPHAGAN) 0.2 % ophthalmic solution Place 1 drop into the left eye 3 (three) times daily. 5 mL 0  . dorzolamide (TRUSOPT) 2 % ophthalmic solution Place 1 drop into the left eye 3 (three) times daily. 10 mL 0  . ezetimibe (ZETIA) 10 MG tablet TAKE 1 TABLET BY MOUTH EVERY DAY 90 tablet 3  . feeding supplement, GLUCERNA SHAKE, (GLUCERNA SHAKE) LIQD Take 237 mLs by mouth 3 (three) times daily between meals.  0  . fluticasone (FLONASE) 50 MCG/ACT nasal spray SPRAY 2 SPRAYS INTO EACH NOSTRIL EVERY DAY 48 mL 1  . Insulin Pen Needle (NOVOFINE) 32G X 6 MM MISC USE 5 TIMES DAILY 450 each 2  . losartan (COZAAR) 25 MG tablet TAKE 1 TABLET BY MOUTH EVERY DAY 90 tablet 1  . metoprolol tartrate (LOPRESSOR) 50 MG tablet Take 1 tablet (50 mg total) by mouth 2 (two) times daily. 180 tablet 3  . ONETOUCH VERIO test strip CHECK LEVELS 4 TIMES DAILY 150 strip 1  . Pitavastatin Calcium (LIVALO) 1 MG TABS Take 1 tablet (1 mg total) by mouth daily. 90 tablet 3  . torsemide (DEMADEX) 20 MG tablet TAKE 2 TABLETS BY MOUTH EVERY DAY 180 tablet 3   No current facility-administered medications on file prior to visit.    Allergies  Allergen Reactions  . Simvastatin Diarrhea  . Statins Other (See Comments)    achey joints    Family History  Problem Relation Age of Onset  . Hypertension Mother   . Heart disease Mother        CHF did not see doctor  . Diabetes Maternal Grandmother   . Diabetes Son     There were no vitals taken for this visit.   Review of Systems Denies LOC    Objective:   Physical Exam    Lab Results  Component Value Date   HGBA1C  7.6 (H) 07/08/2019       Assessment & Plan:  Insulin-requiring type 2 DM, with PAD: this is the best control this pt should aim for, given variable cbg's.  Hypoglycemia: this limits aggressiveness of  glycemic control.  First step in fixing this is to change Lantus to HS.   Patient Instructions  Please first try changing the Lantus 10 units at bedtime, and:  Please continue the same Novolog.  check your blood sugar twice a day.  vary the time of day when you check, between before the 3 meals, and at bedtime.  also check if you have symptoms of your blood sugar being too high or too low.  please keep a record of the readings and bring it to your next appointment here (or you can bring the meter itself).  You can write it on any piece of paper.  please call us sooner if your blood sugar goes below 70, or if you have a lot of readings over 200.   Please have another video visit in 2 months.

## 2019-07-13 NOTE — Patient Instructions (Addendum)
Please first try changing the Lantus 10 units at bedtime, and:  Please continue the same Novolog.  check your blood sugar twice a day.  vary the time of day when you check, between before the 3 meals, and at bedtime.  also check if you have symptoms of your blood sugar being too high or too low.  please keep a record of the readings and bring it to your next appointment here (or you can bring the meter itself).  You can write it on any piece of paper.  please call us sooner if your blood sugar goes below 70, or if you have a lot of readings over 200.   Please have another video visit in 2 months.

## 2019-07-20 ENCOUNTER — Emergency Department (HOSPITAL_COMMUNITY): Payer: PPO

## 2019-07-20 ENCOUNTER — Other Ambulatory Visit: Payer: Self-pay

## 2019-07-20 ENCOUNTER — Emergency Department (HOSPITAL_COMMUNITY)
Admission: EM | Admit: 2019-07-20 | Discharge: 2019-07-20 | Disposition: A | Discharge status: Home / Self Care | Payer: PPO | Attending: Emergency Medicine | Admitting: Emergency Medicine

## 2019-07-20 ENCOUNTER — Encounter (HOSPITAL_COMMUNITY): Payer: Self-pay | Admitting: Emergency Medicine

## 2019-07-20 DIAGNOSIS — Z79899 Other long term (current) drug therapy: Secondary | ICD-10-CM | POA: Insufficient documentation

## 2019-07-20 DIAGNOSIS — I10 Essential (primary) hypertension: Secondary | ICD-10-CM | POA: Diagnosis not present

## 2019-07-20 DIAGNOSIS — E119 Type 2 diabetes mellitus without complications: Secondary | ICD-10-CM | POA: Diagnosis not present

## 2019-07-20 DIAGNOSIS — J189 Pneumonia, unspecified organism: Secondary | ICD-10-CM | POA: Diagnosis not present

## 2019-07-20 DIAGNOSIS — Z87891 Personal history of nicotine dependence: Secondary | ICD-10-CM | POA: Diagnosis not present

## 2019-07-20 DIAGNOSIS — Z794 Long term (current) use of insulin: Secondary | ICD-10-CM | POA: Insufficient documentation

## 2019-07-20 DIAGNOSIS — R0789 Other chest pain: Secondary | ICD-10-CM | POA: Diagnosis present

## 2019-07-20 DIAGNOSIS — I2510 Atherosclerotic heart disease of native coronary artery without angina pectoris: Secondary | ICD-10-CM | POA: Insufficient documentation

## 2019-07-20 DIAGNOSIS — R0602 Shortness of breath: Secondary | ICD-10-CM | POA: Diagnosis not present

## 2019-07-20 DIAGNOSIS — Z951 Presence of aortocoronary bypass graft: Secondary | ICD-10-CM | POA: Insufficient documentation

## 2019-07-20 DIAGNOSIS — R079 Chest pain, unspecified: Secondary | ICD-10-CM

## 2019-07-20 DIAGNOSIS — Z7982 Long term (current) use of aspirin: Secondary | ICD-10-CM | POA: Diagnosis not present

## 2019-07-20 DIAGNOSIS — R072 Precordial pain: Secondary | ICD-10-CM | POA: Diagnosis not present

## 2019-07-20 HISTORY — DX: Unspecified atrial fibrillation: I48.91

## 2019-07-20 LAB — COMPREHENSIVE METABOLIC PANEL
ALT: 19 U/L (ref 0–44)
AST: 19 U/L (ref 15–41)
Albumin: 4.4 g/dL (ref 3.5–5.0)
Alkaline Phosphatase: 58 U/L (ref 38–126)
Anion gap: 10 (ref 5–15)
BUN: 18 mg/dL (ref 8–23)
CO2: 29 mmol/L (ref 22–32)
Calcium: 9.1 mg/dL (ref 8.9–10.3)
Chloride: 96 mmol/L — ABNORMAL LOW (ref 98–111)
Creatinine, Ser: 1.1 mg/dL (ref 0.61–1.24)
GFR calc Af Amer: >60 mL/min (ref >60)
GFR calc non Af Amer: >60 mL/min (ref >60)
Glucose, Bld: 191 mg/dL — ABNORMAL HIGH (ref 70–99)
Potassium: 5 mmol/L (ref 3.5–5.1)
Sodium: 135 mmol/L (ref 135–145)
Total Bilirubin: 1.2 mg/dL (ref 0.3–1.2)
Total Protein: 8.1 g/dL (ref 6.5–8.1)

## 2019-07-20 LAB — CBC WITH DIFFERENTIAL/PLATELET
Abs Immature Granulocytes: 0.04 10*3/uL (ref 0.00–0.07)
Basophils Absolute: 0 10*3/uL (ref 0.0–0.1)
Basophils Relative: 0 %
Eosinophils Absolute: 0.2 10*3/uL (ref 0.0–0.5)
Eosinophils Relative: 2 %
HCT: 37.2 % — ABNORMAL LOW (ref 39.0–52.0)
Hemoglobin: 11.7 g/dL — ABNORMAL LOW (ref 13.0–17.0)
Immature Granulocytes: 0 %
Lymphocytes Relative: 17 %
Lymphs Abs: 1.7 10*3/uL (ref 0.7–4.0)
MCH: 28.4 pg (ref 26.0–34.0)
MCHC: 31.5 g/dL (ref 30.0–36.0)
MCV: 90.3 fL (ref 80.0–100.0)
Monocytes Absolute: 0.7 10*3/uL (ref 0.1–1.0)
Monocytes Relative: 7 %
Neutro Abs: 7.4 10*3/uL (ref 1.7–7.7)
Neutrophils Relative %: 74 %
Platelets: 291 10*3/uL (ref 150–400)
RBC: 4.12 MIL/uL — ABNORMAL LOW (ref 4.22–5.81)
RDW: 14.1 % (ref 11.5–15.5)
WBC: 10 10*3/uL (ref 4.0–10.5)
nRBC: 0 % (ref 0.0–0.2)

## 2019-07-20 LAB — BRAIN NATRIURETIC PEPTIDE: B Natriuretic Peptide: 111.2 pg/mL — ABNORMAL HIGH (ref 0.0–100.0)

## 2019-07-20 LAB — TROPONIN I (HIGH SENSITIVITY)
Troponin I (High Sensitivity): 5 ng/L (ref <18)
Troponin I (High Sensitivity): 6 ng/L (ref <18)

## 2019-07-20 LAB — MAGNESIUM: Magnesium: 2.1 mg/dL (ref 1.7–2.4)

## 2019-07-20 LAB — D-DIMER, QUANTITATIVE: D-Dimer, Quant: 0.96 ug/mL-FEU — ABNORMAL HIGH (ref 0.00–0.50)

## 2019-07-20 LAB — LIPASE, BLOOD: Lipase: 23 U/L (ref 11–51)

## 2019-07-20 LAB — TSH: TSH: 4.355 u[IU]/mL (ref 0.350–4.500)

## 2019-07-20 MED ORDER — ALBUTEROL SULFATE (2.5 MG/3ML) 0.083% IN NEBU
5.0000 mg | INHALATION_SOLUTION | Freq: Once | RESPIRATORY_TRACT | Status: DC
  Filled 2019-07-20: qty 6

## 2019-07-20 MED ORDER — SODIUM CHLORIDE (PF) 0.9 % IJ SOLN
INTRAMUSCULAR | Status: AC
  Filled 2019-07-20: qty 50

## 2019-07-20 MED ORDER — IOHEXOL 350 MG/ML SOLN
100.0000 mL | Freq: Once | INTRAVENOUS | Status: AC | PRN
  Administered 2019-07-20: 100 mL via INTRAVENOUS

## 2019-07-20 MED ORDER — MAGNESIUM SULFATE 2 GM/50ML IV SOLN: 2.0000 g | Freq: Once | INTRAVENOUS | Status: DC

## 2019-07-20 MED ORDER — DOXYCYCLINE HYCLATE 100 MG PO CAPS: 100.0000 mg | ORAL_CAPSULE | Freq: Two times a day (BID) | ORAL | 0 refills | Status: DC

## 2019-07-20 NOTE — ED Provider Notes (Signed)
[facility] DEPT Provider Note   CSN: [id number] Arrival date & time: 07/20/19  1222     History Chief Complaint  Patient presents with  . Shortness of Breath    Martin Lakes Sr. is a 74 y.o. male.  The history is provided by the patient and medical records. No language interpreter was used.  Chest Pain Pain location:  Substernal area Pain quality: pressure and tightness   Pain radiates to:  Does not radiate Pain severity:  Moderate (5/10) Onset quality:  Gradual Timing:  Constant Progression:  Waxing and waning Chronicity:  Chronic Relieved by:  Nothing Worsened by:  Nothing Ineffective treatments:  None tried Associated symptoms: anxiety, cough (chronic), lower extremity edema, nausea, palpitations and shortness of breath   Associated symptoms: no abdominal pain, no altered mental status, no back pain, no claudication, no diaphoresis, no dizziness, no fatigue, no headache, no near-syncope, no numbness, no vomiting and no weakness   Risk factors: coronary artery disease, diabetes mellitus, high cholesterol, hypertension, male sex and obesity   Risk factors: no prior DVT/PE        Past Medical History:  Diagnosis Date  . A-fib ([place])   . Diabetes mellitus   . Heart attack ([place]) 10/01/2018   Pt had open heart surgery  . Hypertension   . Tobacco abuse     Patient Active Problem List   Diagnosis Date Noted  . 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 Postoperative atrial fibrillation ([place]) 10/24/2018  . Acute blood loss as cause of postoperative anemia 10/24/2018  . BPH (benign prostatic hyperplasia) 10/24/2018  . History of depression 10/24/2018  . 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 Onychomycosis of toenail 02/17/2018  . Allergic rhinitis 08/20/2017  . PAD (peripheral artery disease) ([place]) 08/20/2017  . Partial tear of right Achilles tendon 02/12/2017  . Aortic atherosclerosis  ([place]) 10/07/2016  . C. difficile colitis 09/25/2016  . History of aspiration pneumonia 09/25/2016  . History of skin cancer 08/15/2015  . Undiagnosed cardiac murmurs 08/15/2015  . Diabetic retinopathy ([place]) 02/21/2015  . Former smoker 08/24/2014  . Hyperlipidemia associated with type 2 diabetes mellitus ([place]) 08/24/2014  . Ingrowing toenail 07/19/2014  . Diabetic polyneuropathy ([place]) 04/11/2009  . BACK PAIN, LUMBAR, WITH RADICULOPATHY 08/30/2008  . DM (diabetes mellitus) type II uncontrolled with eye manifestation ([place]) 02/03/2007  . Hypertension associated with diabetes ([place]) 02/03/2007    Past Surgical History:  Procedure Laterality Date  . CORONARY ARTERY BYPASS GRAFT N/A 10/01/2018   Procedure: CORONARY ARTERY BYPASS GRAFTING (CABG) TIMES  FOUR USING LEFT MAMMARY ARTERY AND RIGHT GREATER SAPHEANOUS VEIN HARVESTED ENDOSCOPICALLY;  Surgeon: Melrose Nakayama, MD;  Location: [facility];  Service: Open Heart Surgery;  Laterality: N/A;  . HERNIA REPAIR     >10 years ago  . IR RADIOLOGIST EVAL & MGMT  12/24/2016  . IR THORACENTESIS ASP PLEURAL SPACE W/IMG GUIDE  10/06/2018  . LEFT HEART CATH AND CORONARY ANGIOGRAPHY N/A 09/29/2018   Procedure: LEFT HEART CATH AND CORONARY ANGIOGRAPHY;  Surgeon: Troy Sine, MD;  Location: [facility];  Service: Cardiovascular;  Laterality: N/A;  . open heart surfery    . right shoulder surgery     around 2014  . TEE WITHOUT CARDIOVERSION N/A 10/01/2018   Procedure: TRANSESOPHAGEAL ECHOCARDIOGRAM (TEE);  Surgeon: Melrose Nakayama, MD;  Location: [facility];  Service: Open Heart Surgery;  Laterality: N/A;  Family History  Problem Relation Age of Onset  . Hypertension Mother   . Heart disease Mother        CHF did not see doctor  . Diabetes Maternal Grandmother   . Diabetes Son     Social History   Tobacco Use  . Smoking status: Former Smoker    Packs/day: 0.75    Years: 2.00    Pack years: 1.50    Types: Cigarettes    Start date:  2002    Quit date: 04/29/2002    Years since quitting: 17.2  . Smokeless tobacco: Never Used  . Tobacco comment: smoked for 10 years on and off  Substance Use Topics  . Alcohol use: Yes    Alcohol/week: 0.0 standard drinks    Comment: 6 martinis a year  . Drug use: No    Home Medications Prior to Admission medications   Medication Sig Start Date End Date Taking? Authorizing Provider  aspirin 81 MG EC tablet Take 1 tablet (81 mg total) by mouth daily. 02/04/19   Troy Sine, MD  BESIVANCE 0.6 % SUSP Place 1 drop into the left eye 3 (three) times daily. 10/28/18   Hennie Duos, MD  brimonidine (ALPHAGAN) 0.2 % ophthalmic solution Place 1 drop into the left eye 3 (three) times daily. 10/28/18   Hennie Duos, MD  dorzolamide (TRUSOPT) 2 % ophthalmic solution Place 1 drop into the left eye 3 (three) times daily. 10/28/18   Hennie Duos, MD  ezetimibe (ZETIA) 10 MG tablet TAKE 1 TABLET BY MOUTH EVERY DAY 05/04/19   Troy Sine, MD  feeding supplement, GLUCERNA SHAKE, (GLUCERNA SHAKE) LIQD Take 237 mLs by mouth 3 (three) times daily between meals. 10/19/18   Barrett, Erin R, PA-C  fluticasone (FLONASE) 50 MCG/ACT nasal spray SPRAY 2 SPRAYS INTO EACH NOSTRIL EVERY DAY 05/10/19   Marin Olp, MD  insulin aspart (NOVOLOG FLEXPEN) 100 UNIT/ML FlexPen 3 times a day (just before each meal) 25-20-25 units, and pen needles 4/day 07/13/19   Renato Shin, MD  insulin glargine (LANTUS SOLOSTAR) 100 UNIT/ML Solostar Pen Inject 10 Units into the skin at bedtime. 07/13/19   Renato Shin, MD  Insulin Pen Needle (NOVOFINE) 32G X 6 MM MISC USE 5 TIMES DAILY 07/05/19   Renato Shin, MD  losartan (COZAAR) 25 MG tablet TAKE 1 TABLET BY MOUTH EVERY DAY 05/04/19   Troy Sine, MD  metoprolol tartrate (LOPRESSOR) 50 MG tablet Take 1 tablet (50 mg total) by mouth 2 (two) times daily. 04/09/19   Troy Sine, MD  ONETOUCH VERIO test strip CHECK LEVELS 4 TIMES DAILY 07/05/19   Renato Shin, MD    Pitavastatin Calcium (LIVALO) 1 MG TABS Take 1 tablet (1 mg total) by mouth daily. 07/08/19   Marin Olp, MD  torsemide (DEMADEX) 20 MG tablet TAKE 2 TABLETS BY MOUTH EVERY DAY 05/04/19   Troy Sine, MD    Allergies    Simvastatin and Statins  Review of Systems   Review of Systems  Constitutional: Negative for chills, diaphoresis and fatigue.  HENT: Negative for congestion.   Eyes: Negative for visual disturbance.  Respiratory: Positive for cough (chronic), chest tightness and shortness of breath. Negative for wheezing and stridor.   Cardiovascular: Positive for chest pain, palpitations and leg swelling. Negative for claudication and near-syncope.  Gastrointestinal: Positive for nausea. Negative for abdominal pain, constipation, diarrhea and vomiting.  Genitourinary: Negative for frequency.  Musculoskeletal: Negative for back pain,  neck pain and neck stiffness.  Skin: Negative for rash and wound.  Neurological: Negative for dizziness, seizures, syncope, weakness, numbness and headaches.  Psychiatric/Behavioral: Negative for agitation.    Physical Exam Updated Vital Signs BP (!) 168/73 (BP Location: Left Arm)   Pulse 76   Temp 98.3 F (36.8 C) (Oral)   Resp (!) 21   SpO2 100%   Physical Exam Vitals and nursing note reviewed.  Constitutional:      General: He is not in acute distress.    Appearance: He is well-developed. He is not ill-appearing, toxic-appearing or diaphoretic.  HENT:     Head: Normocephalic and atraumatic.  Eyes:     Conjunctiva/sclera: Conjunctivae normal.     Pupils: Pupils are equal, round, and reactive to light.  Cardiovascular:     Rate and Rhythm: Normal rate and regular rhythm.     Heart sounds: Murmur present.  Pulmonary:     Effort: Pulmonary effort is normal. Tachypnea present. No respiratory distress.     Breath sounds: Examination of the right-lower field reveals rales. Examination of the left-lower field reveals rales. Rales  present. No wheezing or rhonchi.  Chest:     Chest wall: Tenderness present.  Abdominal:     Palpations: Abdomen is soft.     Tenderness: There is no abdominal tenderness.  Musculoskeletal:     Cervical back: Neck supple.     Right lower leg: No tenderness. Edema present.     Left lower leg: No tenderness. Edema present.  Skin:    General: Skin is warm and dry.     Capillary Refill: Capillary refill takes less than 2 seconds.     Findings: No erythema.  Neurological:     General: No focal deficit present.     Mental Status: He is alert.  Psychiatric:        Mood and Affect: Mood normal.     ED Results / Procedures / Treatments   Labs (all labs ordered are listed, but only abnormal results are displayed) Labs Reviewed  CBC WITH DIFFERENTIAL/PLATELET - Abnormal; Notable for the following components:      Result Value   RBC 4.12 (*)    Hemoglobin 11.7 (*)    HCT 37.2 (*)    All other components within normal limits  COMPREHENSIVE METABOLIC PANEL  LIPASE, BLOOD  BRAIN NATRIURETIC PEPTIDE  TSH  MAGNESIUM  D-DIMER, QUANTITATIVE (NOT AT [facility])  TROPONIN I (HIGH SENSITIVITY)    EKG EKG Interpretation  Date/Time:  Tuesday July 20 2019 12:43:07 EDT Ventricular Rate:  77 PR Interval:    QRS Duration: 93 QT Interval:  379 QTC Calculation: 429 R Axis:   71 Text Interpretation: Sinus rhythm Low voltage, precordial leads 12 Lead; Mason-Likar When compared to prior, now sinus rhythm No STEMI Confirmed by Antony Blackbird [phone]) on 07/20/2019 3:51:24 PM   Radiology DG Chest 2 View  Result Date: 07/20/2019 CLINICAL DATA:  Short of breath, chest pressure, previous tobacco abuse EXAM: CHEST - 2 VIEW COMPARISON:  02/02/2019 FINDINGS: Frontal and lateral views of the chest demonstrates stable postsurgical changes from bypass surgery. The cardiac silhouette is unremarkable. Chronic scarring at the lung bases with stable right pleural thickening. No airspace disease, effusion, or  pneumothorax. IMPRESSION: 1. Stable postsurgical changes, no acute process. Electronically Signed   By: Randa Ngo M.D.   On: 07/20/2019 13:41    Procedures Procedures (including critical care time)  Medications Ordered in ED Medications  albuterol (PROVENTIL) (2.5 MG/3ML) 0.083%  nebulizer solution 5 mg (has no administration in time range)    ED Course  I have reviewed the triage vital signs and the nursing notes.  Pertinent labs & imaging results that were available during my care of the patient were reviewed by me and considered in my medical decision making (see chart for details).    MDM Rules/Calculators/A&P                      Dean Lubbers [facility] Sr. is a 74 y.o. male with a past medical history significant for hypertension, diabetes, CAD status post CABG last summer, prior atrial fibrillation, hyperlipidemia, and depression who presents with chest discomfort and anxiety.  Patient reports that ever since he had his CABG last summer he has been having chest discomfort on and off.  He reports that it is continued over the last few days but he is also had episodes of the severe panic and anxiety.  He reports that when he was admitted he had these panic attacks and had not had them until yesterday when he had one.  He reports last several hours and he felt an impending doom and everything closing on him sensation.  He reports it went away but then occurred again today.  He reports he was having slightly worsened chest pressure and tightness with it.  He also reports palpitations but does not feel persistent tachycardia like he has had in the past.  He denies vomiting but reports some nausea.  He denies syncope but felt slightly lightheaded.  He reports he is chronically having edema that seems similar to prior.  He denies any urinary or GI symptoms otherwise.  Denies recent trauma.  Reports it does not feel like his prior MI.  He denies history of DVT or PE.  On exam, legs are edematous but  he has good pulses in lower extremities and upper extremities.  Chest is slightly nontender but does not feel the same as the discomfort he was feeling.  His lungs have some crackles in the bases but there is no rhonchi.  Systolic murmur appreciated.  Abdomen nontender.  Vital signs show hypertension but otherwise reassuring.  EKG shows no STEMI.  Clinically I suspect his symptoms are either anxiety/panic attacks versus chest wall pain from his prior CABG however given his history with his report of discomfort, some shortness of breath, and the sense of impending doom, we will get labs including cardiac enzymes, D-dimer, and a BNP with the edema.  Anticipate monitoring on telemetry while his work-up is completed.  If work-up is clear reassuring, anticipate discharge for outpatient cardiology follow-up.  Patient reason plan of care.  Care transferred to oncoming team while awaiting results of diagnostic work-up.   Final Clinical Impression(s) / ED Diagnoses Final diagnoses:  Chest tightness     Clinical Impression: 1. Chest tightness     Disposition: Care transferred to oncoming team while awaiting results of diagnostic work-up.  Anticipate discharge if work-up is reassuring.  This note was prepared with assistance of Systems analyst. Occasional wrong-word or sound-a-like substitutions may have occurred due to the inherent limitations of voice recognition software.      Heather Mckendree, Gwenyth Allegra, MD 07/20/19 1710

## 2019-07-21 ENCOUNTER — Ambulatory Visit (HOSPITAL_COMMUNITY)
Admission: RE | Admit: 2019-07-21 | Payer: PPO | Source: Ambulatory Visit | Attending: Family Medicine | Admitting: Family Medicine

## 2019-07-21 DIAGNOSIS — R262 Difficulty in walking, not elsewhere classified: Secondary | ICD-10-CM | POA: Diagnosis not present

## 2019-07-21 DIAGNOSIS — M6281 Muscle weakness (generalized): Secondary | ICD-10-CM | POA: Diagnosis not present

## 2019-07-28 ENCOUNTER — Encounter: Payer: Self-pay | Admitting: Family Medicine

## 2019-07-30 ENCOUNTER — Other Ambulatory Visit: Payer: Self-pay | Admitting: Endocrinology

## 2019-08-02 ENCOUNTER — Other Ambulatory Visit: Payer: Self-pay | Admitting: Thoracic Surgery (Cardiothoracic Vascular Surgery)

## 2019-08-02 DIAGNOSIS — Z951 Presence of aortocoronary bypass graft: Secondary | ICD-10-CM

## 2019-08-03 ENCOUNTER — Other Ambulatory Visit: Payer: Self-pay

## 2019-08-03 ENCOUNTER — Ambulatory Visit
Admission: RE | Admit: 2019-08-03 | Discharge: 2019-08-03 | Disposition: A | Discharge status: Home / Self Care | Payer: PPO | Source: Ambulatory Visit | Attending: Thoracic Surgery (Cardiothoracic Vascular Surgery) | Admitting: Thoracic Surgery (Cardiothoracic Vascular Surgery)

## 2019-08-03 ENCOUNTER — Encounter: Payer: Self-pay | Admitting: Thoracic Surgery (Cardiothoracic Vascular Surgery)

## 2019-08-03 ENCOUNTER — Ambulatory Visit: Payer: PPO | Admitting: Thoracic Surgery (Cardiothoracic Vascular Surgery)

## 2019-08-03 VITALS — BP 157/98 | HR 88 | Temp 96.8°F | Resp 16 | Ht 72.0 in | Wt 247.0 lb

## 2019-08-03 DIAGNOSIS — Z951 Presence of aortocoronary bypass graft: Secondary | ICD-10-CM

## 2019-08-03 DIAGNOSIS — I25119 Atherosclerotic heart disease of native coronary artery with unspecified angina pectoris: Secondary | ICD-10-CM | POA: Diagnosis not present

## 2019-08-03 NOTE — Progress Notes (Signed)
[address]       [place] [address]             [phone]       HPI: 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  He presented in June 2020 with ST elevation MI.  I did coronary bypass grafting x4 on 10/01/2018.  His postoperative course was complicated with profound weakness, orthostatic hypotension, acute kidney injury, depression, deconditioning, and atrial fibrillation.  He went to a SNF about 2 and half weeks postop and then finally went home in August 2020.  Last saw him back in the office postoperatively in September he had a sternal dehiscence.  There was no evidence of infection.  I saw him back in October.  He was doing well at that time.  He was feeling movement in his chest but it was not particularly bothersome to him.  In the interim since his last visit he was diagnosed with pneumonia a couple of weeks ago.  He was treated with antibiotics and that has improved.  He has been fairly active working in his yard.  He does have some discomfort when he coughs.  Is more of an uneasy sensation of the bone grinding rather than pain, but it is irritating him to some degree.  Past Medical History:  Diagnosis Date  . A-fib ([place])   . Diabetes mellitus   . Heart attack ([place]) 10/01/2018   Pt had open heart surgery  . Hypertension   . Tobacco abuse     Current Outpatient Medications  Medication Sig Dispense Refill  . aspirin 81 MG EC tablet Take 1 tablet (81 mg total) by mouth daily.    Marland Kitchen BESIVANCE 0.6 % SUSP Place 1 drop into the left eye 3 (three) times daily. (Patient taking differently: Place 1 drop into the left eye 3 (three) times daily as needed (eye pressure). ) 5 mL 0  . brimonidine (ALPHAGAN) 0.2 % ophthalmic solution Place 1 drop into the  left eye 3 (three) times daily. (Patient taking differently: Place 1 drop into the left eye 3 (three) times daily as needed (eye pressure). ) 5 mL 0  . dorzolamide (TRUSOPT) 2 % ophthalmic solution Place 1 drop into the left eye 3 (three) times daily. (Patient taking differently: Place 1 drop into the left eye 3 (three) times daily as needed (eye pressure). ) 10 mL 0  . ezetimibe (ZETIA) 10 MG tablet TAKE 1 TABLET BY MOUTH EVERY DAY 90 tablet 3  . fexofenadine (ALLEGRA) 180 MG tablet Take 180 mg by mouth daily.    . fluticasone (FLONASE) 50 MCG/ACT nasal spray SPRAY 2 SPRAYS INTO EACH NOSTRIL EVERY DAY (Patient taking differently: Place 1-2 sprays into both nostrils daily as needed for allergies. ) 48 mL 1  . insulin aspart (NOVOLOG FLEXPEN) 100 UNIT/ML FlexPen 3 times a day (just before each meal) 25-20-25 units, and pen needles 4/day 25 pen 3  . insulin glargine (LANTUS SOLOSTAR) 100 UNIT/ML Solostar Pen Inject 10 Units into the skin at bedtime. 5 pen PRN  . Insulin Pen Needle (NOVOFINE) 32G X 6 MM MISC USE 5 TIMES DAILY 450 each 2  . losartan (COZAAR) 25 MG tablet TAKE 1 TABLET BY MOUTH EVERY DAY (Patient taking differently: 12.5 mg daily. ) 90 tablet 1  .  metoprolol tartrate (LOPRESSOR) 50 MG tablet Take 1 tablet (50 mg total) by mouth 2 (two) times daily. 180 tablet 3  . ONETOUCH VERIO test strip CHECK LEVELS 4 TIMES DAILY 150 strip 1  . Pitavastatin Calcium (LIVALO) 1 MG TABS Take 1 tablet (1 mg total) by mouth daily. 90 tablet 3  . torsemide (DEMADEX) 20 MG tablet TAKE 2 TABLETS BY MOUTH EVERY DAY (Patient taking differently: Take 20 mg by mouth daily. ) 180 tablet 3  . vitamin C (ASCORBIC ACID) 250 MG tablet Take 250 mg by mouth daily.    Marland Kitchen doxycycline (VIBRAMYCIN) 100 MG capsule Take 1 capsule (100 mg total) by mouth 2 (two) times daily. 14 capsule 0   No current facility-administered medications for this visit.    Physical Exam BP (!) 157/98 (BP Location: Left Arm, Patient Position:  Sitting, Cuff Size: Normal)   Pulse 88   Temp (!) 96.8 F (36 C)   Resp 16   Ht 6' (1.829 m)   Wt 247 lb (112 kg)   SpO2 95% Comment: ON RA  BMI 33.44 kg/m  74 year old man in no acute distress Alert and oriented x3 with no focal deficits Lungs faint crackles at right base, otherwise clear Cardiac regular rate and rhythm Sternal incision intact, sternum unstable, mildly tender  Diagnostic Tests: CHEST - 2 VIEW  COMPARISON:  07/20/2019  FINDINGS: Low lung volumes. Cardiopericardial silhouette is at upper limits of normal for size. Bibasilar atelectasis/scarring is similar to prior. No pulmonary edema or focal airspace consolidation. No substantial pleural effusion. The visualized bony structures of the thorax are intact.  IMPRESSION: Stable. Low volume film with bibasilar atelectasis/scarring. No acute cardiopulmonary findings.   Electronically Signed   By: Misty Stanley M.D.   On: 08/03/2019 09:47 I personally reviewed the chest x-ray images and concur with the findings noted above  Impression: Martin [name] is a 74 year old gentleman who had coronary artery bypass grafting about 10 months ago after presenting with an ST elevation MI.  He has done well from a cardiac standpoint, but did have a sternal dehiscence.  He had a prolonged and complicated hospital course and it was not picked up during that time but was found on a follow-up visit.  I again reviewed with him that this is not dangerous to him but may continue to cause discomfort.  I think the best surgical option should he choose to pursue it would be to do sternal plating.  He does not want to do that at this time, but thinks he may want to at some point.  He has gotten his COVID-19 vaccine.  Plan: Return in 6 months with PA and lateral chest x-ray. If he decides before that he would like to proceed with sternal plating he will give [place] a call.  Melrose Nakayama, MD [facility] [phone]

## 2019-08-06 DIAGNOSIS — E113213 Type 2 diabetes mellitus with mild nonproliferative diabetic retinopathy with macular edema, bilateral: Secondary | ICD-10-CM | POA: Diagnosis not present

## 2019-08-06 DIAGNOSIS — H35372 Puckering of macula, left eye: Secondary | ICD-10-CM | POA: Diagnosis not present

## 2019-08-06 DIAGNOSIS — H40023 Open angle with borderline findings, high risk, bilateral: Secondary | ICD-10-CM | POA: Diagnosis not present

## 2019-08-06 DIAGNOSIS — H43813 Vitreous degeneration, bilateral: Secondary | ICD-10-CM | POA: Diagnosis not present

## 2019-08-06 LAB — HM DIABETES EYE EXAM

## 2019-08-09 DIAGNOSIS — E113213 Type 2 diabetes mellitus with mild nonproliferative diabetic retinopathy with macular edema, bilateral: Secondary | ICD-10-CM | POA: Diagnosis not present

## 2019-08-09 DIAGNOSIS — H40023 Open angle with borderline findings, high risk, bilateral: Secondary | ICD-10-CM | POA: Diagnosis not present

## 2019-08-09 DIAGNOSIS — D3130 Benign neoplasm of unspecified choroid: Secondary | ICD-10-CM | POA: Diagnosis not present

## 2019-08-09 DIAGNOSIS — H35372 Puckering of macula, left eye: Secondary | ICD-10-CM | POA: Diagnosis not present

## 2019-08-11 ENCOUNTER — Inpatient Hospital Stay (HOSPITAL_COMMUNITY): Admission: RE | Admit: 2019-08-11 | Payer: PPO | Source: Ambulatory Visit

## 2019-08-21 DIAGNOSIS — M6281 Muscle weakness (generalized): Secondary | ICD-10-CM | POA: Diagnosis not present

## 2019-08-21 DIAGNOSIS — R262 Difficulty in walking, not elsewhere classified: Secondary | ICD-10-CM | POA: Diagnosis not present

## 2019-08-30 ENCOUNTER — Other Ambulatory Visit: Payer: Self-pay

## 2019-08-30 ENCOUNTER — Ambulatory Visit (HOSPITAL_COMMUNITY)
Admission: RE | Admit: 2019-08-30 | Discharge: 2019-08-30 | Disposition: A | Discharge status: Home / Self Care | Payer: PPO | Source: Ambulatory Visit | Attending: Cardiology | Admitting: Cardiology

## 2019-08-30 DIAGNOSIS — I723 Aneurysm of iliac artery: Secondary | ICD-10-CM

## 2019-09-08 DIAGNOSIS — D3131 Benign neoplasm of right choroid: Secondary | ICD-10-CM | POA: Diagnosis not present

## 2019-09-08 DIAGNOSIS — E113213 Type 2 diabetes mellitus with mild nonproliferative diabetic retinopathy with macular edema, bilateral: Secondary | ICD-10-CM | POA: Diagnosis not present

## 2019-09-08 DIAGNOSIS — H35372 Puckering of macula, left eye: Secondary | ICD-10-CM | POA: Diagnosis not present

## 2019-09-08 DIAGNOSIS — H43813 Vitreous degeneration, bilateral: Secondary | ICD-10-CM | POA: Diagnosis not present

## 2019-09-20 DIAGNOSIS — M6281 Muscle weakness (generalized): Secondary | ICD-10-CM | POA: Diagnosis not present

## 2019-09-20 DIAGNOSIS — R262 Difficulty in walking, not elsewhere classified: Secondary | ICD-10-CM | POA: Diagnosis not present

## 2019-10-08 DIAGNOSIS — H35372 Puckering of macula, left eye: Secondary | ICD-10-CM | POA: Diagnosis not present

## 2019-10-08 DIAGNOSIS — D3131 Benign neoplasm of right choroid: Secondary | ICD-10-CM | POA: Diagnosis not present

## 2019-10-08 DIAGNOSIS — H26492 Other secondary cataract, left eye: Secondary | ICD-10-CM | POA: Diagnosis not present

## 2019-10-08 DIAGNOSIS — E113213 Type 2 diabetes mellitus with mild nonproliferative diabetic retinopathy with macular edema, bilateral: Secondary | ICD-10-CM | POA: Diagnosis not present

## 2019-10-21 ENCOUNTER — Telehealth: Payer: Self-pay | Admitting: Endocrinology

## 2019-10-21 DIAGNOSIS — H26492 Other secondary cataract, left eye: Secondary | ICD-10-CM | POA: Diagnosis not present

## 2019-10-21 DIAGNOSIS — M6281 Muscle weakness (generalized): Secondary | ICD-10-CM | POA: Diagnosis not present

## 2019-10-21 DIAGNOSIS — R262 Difficulty in walking, not elsewhere classified: Secondary | ICD-10-CM | POA: Diagnosis not present

## 2019-10-21 NOTE — Telephone Encounter (Signed)
Returned pt call and left a detailed VM advising him to call back and schedule an appt to address his concerns. LOV was in March and was advised to follow up in May. Detailed message advised that his concerns will be addressed during his appt with Dr. Loanne Drilling.

## 2019-10-21 NOTE — Telephone Encounter (Signed)
Patient called to advise that he is having over night highs and is concerned about that and how to control/handle.    Call back number [phone] - patient has another doctors appointment today from 11a to 3p but stated that we could leave a voicemail

## 2019-11-02 ENCOUNTER — Other Ambulatory Visit: Payer: Self-pay | Admitting: Cardiovascular Disease

## 2019-11-09 DIAGNOSIS — H35372 Puckering of macula, left eye: Secondary | ICD-10-CM | POA: Diagnosis not present

## 2019-11-09 DIAGNOSIS — E113213 Type 2 diabetes mellitus with mild nonproliferative diabetic retinopathy with macular edema, bilateral: Secondary | ICD-10-CM | POA: Diagnosis not present

## 2019-11-09 DIAGNOSIS — H43813 Vitreous degeneration, bilateral: Secondary | ICD-10-CM | POA: Diagnosis not present

## 2019-11-09 DIAGNOSIS — D3130 Benign neoplasm of unspecified choroid: Secondary | ICD-10-CM | POA: Diagnosis not present

## 2019-11-20 DIAGNOSIS — M6281 Muscle weakness (generalized): Secondary | ICD-10-CM | POA: Diagnosis not present

## 2019-11-20 DIAGNOSIS — R262 Difficulty in walking, not elsewhere classified: Secondary | ICD-10-CM | POA: Diagnosis not present

## 2019-11-30 IMAGING — DX CHEST - 2 VIEW
2 series · 2 of 2 positions shown · non-contrast
Comparison: 10/10/2018

CLINICAL DATA: CABG on 10/01/2018. Shortness of breath. Small
effusions and of atelectasis.

EXAM:
CHEST - 2 VIEW

[dg chest 2 view (1 of 2)]
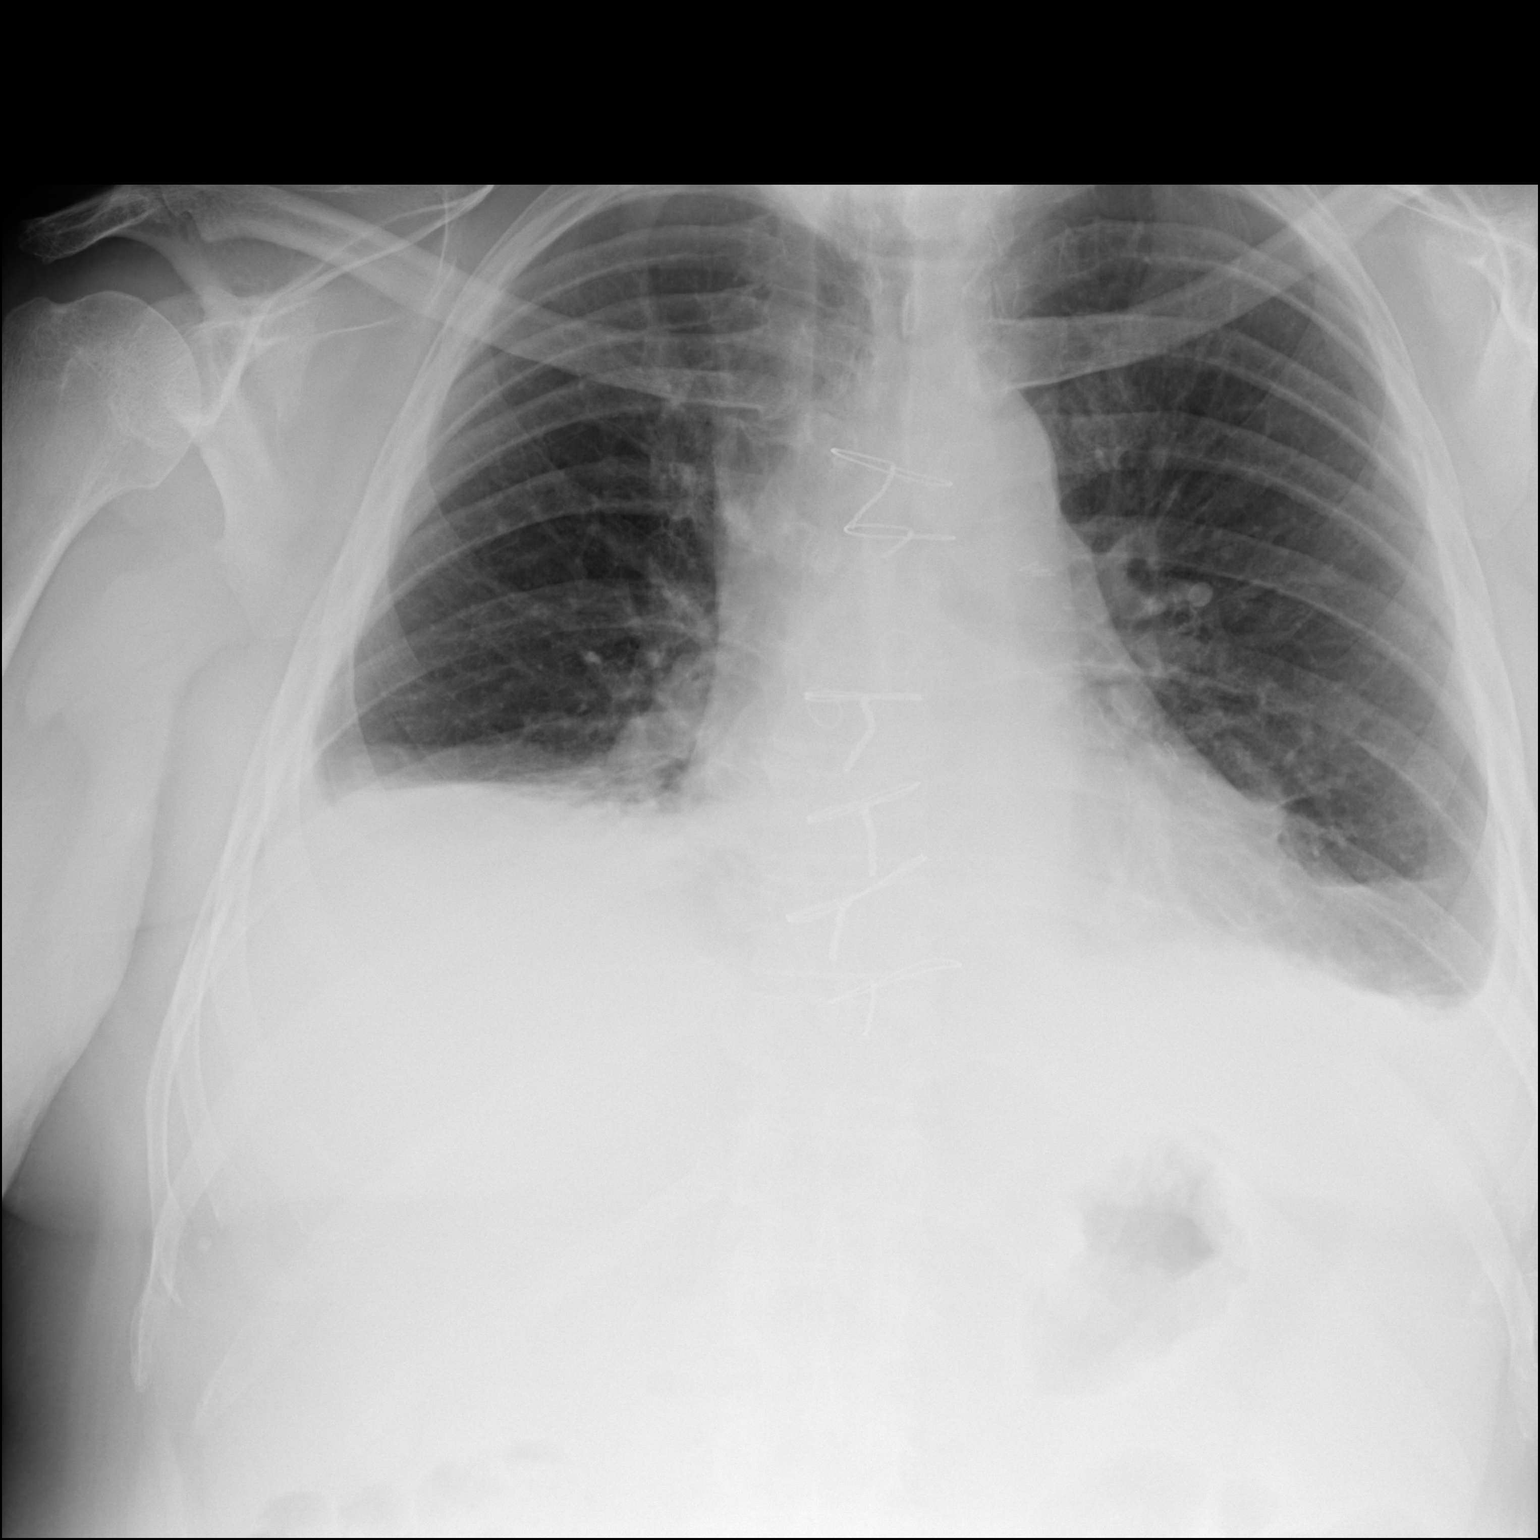

[dg chest 2 view (2 of 2)]
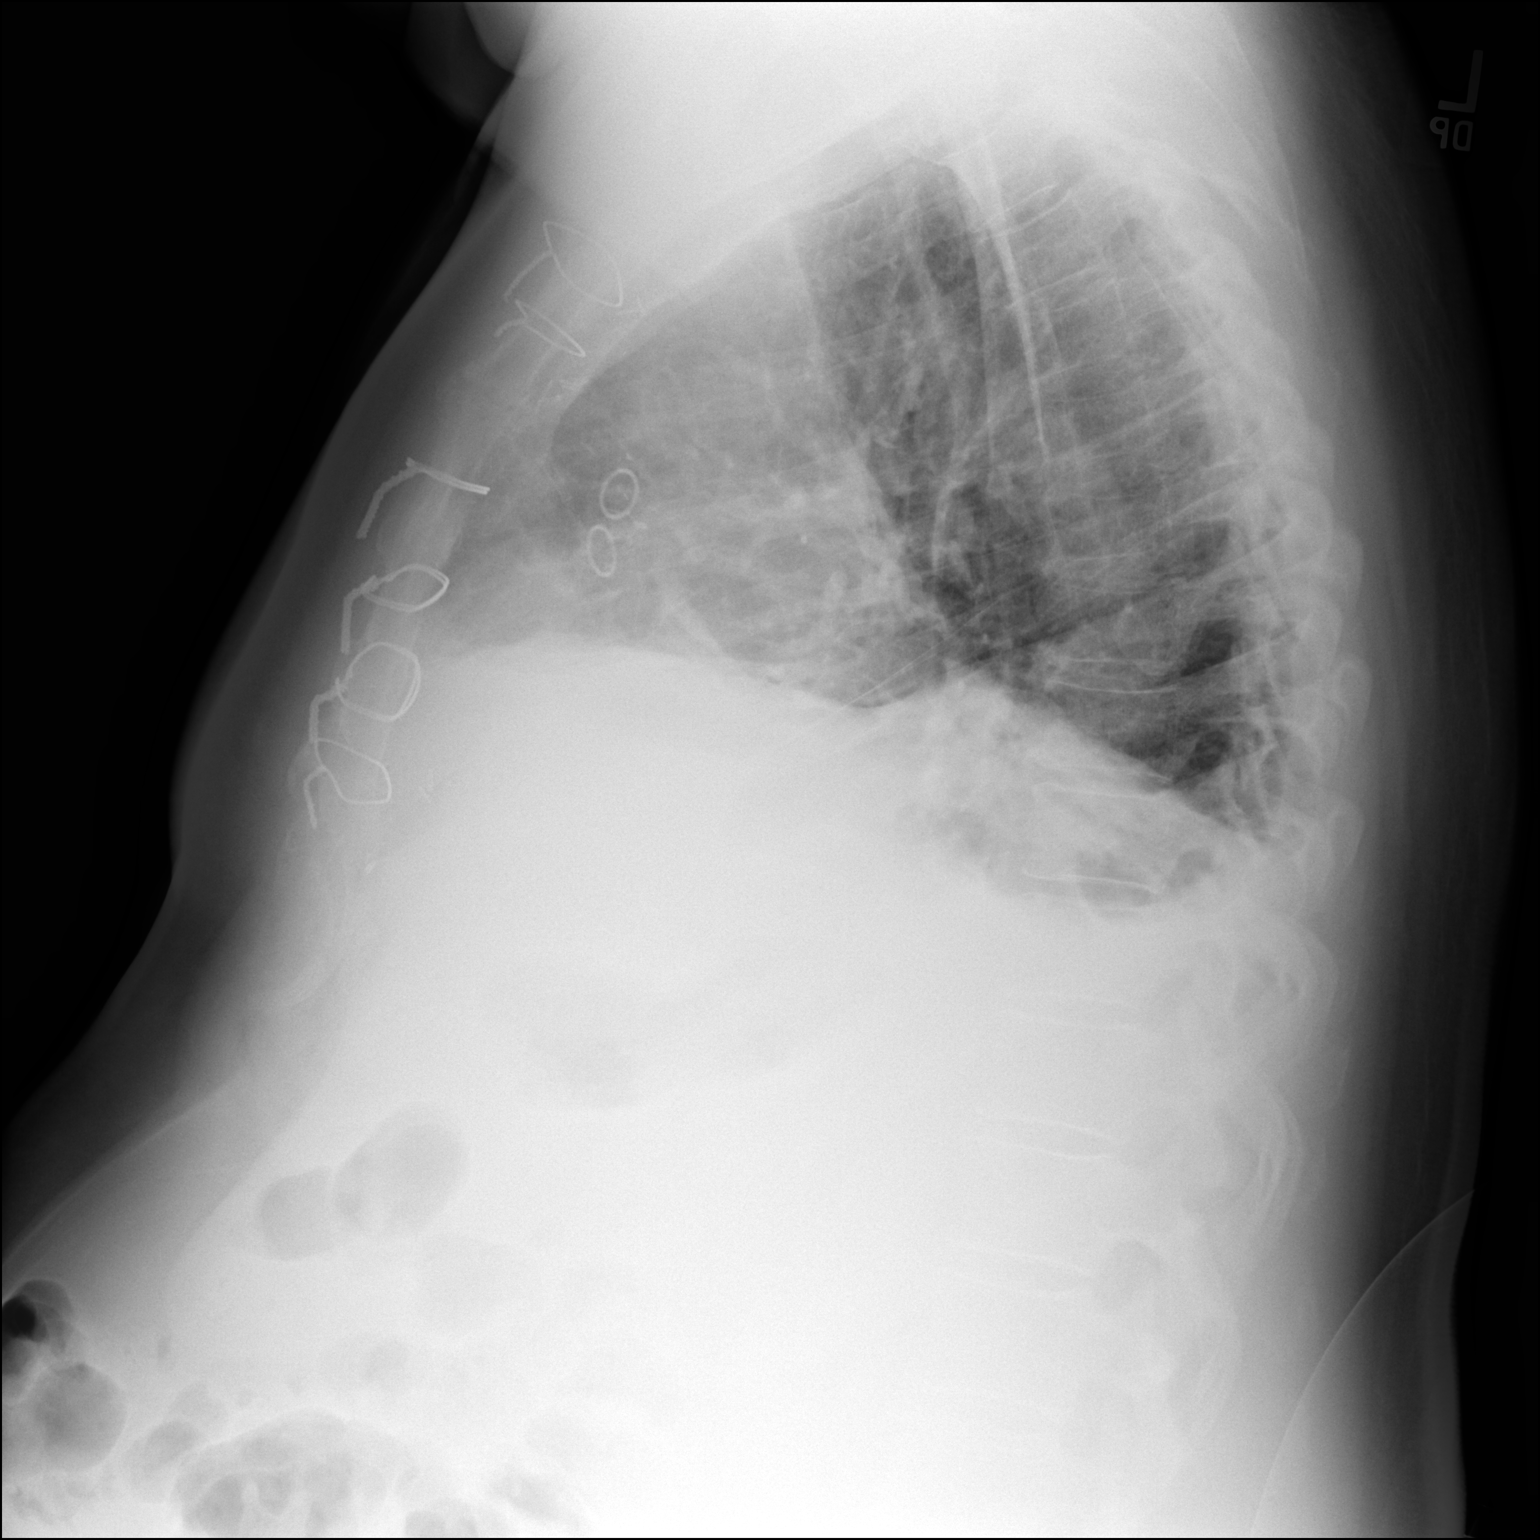

[2 of 2 positions shown; findings below may reference images not displayed]

FINDINGS: The heart size and pulmonary vascularity are normal. There are
persistent small bilateral pleural effusions, slightly diminished.
Improved slight atelectasis at the lung bases.

Bones are normal. CABG.
IMPRESSION: Slight decrease in the small bilateral pleural effusions and
bibasilar atelectasis.

## 2019-12-14 DIAGNOSIS — E113213 Type 2 diabetes mellitus with mild nonproliferative diabetic retinopathy with macular edema, bilateral: Secondary | ICD-10-CM | POA: Diagnosis not present

## 2019-12-14 DIAGNOSIS — H40023 Open angle with borderline findings, high risk, bilateral: Secondary | ICD-10-CM | POA: Diagnosis not present

## 2019-12-14 DIAGNOSIS — H35013 Changes in retinal vascular appearance, bilateral: Secondary | ICD-10-CM | POA: Diagnosis not present

## 2019-12-14 DIAGNOSIS — H43813 Vitreous degeneration, bilateral: Secondary | ICD-10-CM | POA: Diagnosis not present

## 2019-12-20 ENCOUNTER — Encounter: Payer: Self-pay | Admitting: Family Medicine

## 2019-12-20 ENCOUNTER — Other Ambulatory Visit: Payer: Self-pay

## 2019-12-20 ENCOUNTER — Ambulatory Visit (INDEPENDENT_AMBULATORY_CARE_PROVIDER_SITE_OTHER): Payer: PPO | Admitting: Family Medicine

## 2019-12-20 VITALS — BP 130/70 | HR 72 | Temp 97.2°F | Ht 73.0 in | Wt 271.2 lb

## 2019-12-20 DIAGNOSIS — L97522 Non-pressure chronic ulcer of other part of left foot with fat layer exposed: Secondary | ICD-10-CM | POA: Diagnosis not present

## 2019-12-20 DIAGNOSIS — F419 Anxiety disorder, unspecified: Secondary | ICD-10-CM

## 2019-12-20 DIAGNOSIS — E11621 Type 2 diabetes mellitus with foot ulcer: Secondary | ICD-10-CM | POA: Diagnosis not present

## 2019-12-20 NOTE — Patient Instructions (Addendum)
Health Maintenance Due  Topic Date Due  . Fecal DNA (Cologuard) has to send back in  Never done  . INFLUENZA VACCINE  - will complete later in flu season (please let us know if you get this at another location so we can update your chart) . We should have vaccination here in 1-2 months - can call back for an appointment.   11/28/2019   Urgent referral to Dr. Jodene Nam office- wound care specialist without plastic surgery office   Please call [phone] to schedule a visit with [facility] -Trey Paula is an excellent counselor who is based out of our clinic  Could check with insurance to see if [facility] is covered for endocrinology- Dr. Chalmers Cater and Dr. Garnet Koyanagi.   [facility] Buddy Duty [facility] excellent [facility] but is in a larger building which may not solve your problem   Recommended follow up: keep October visit - happy to see you sooner if needed

## 2019-12-20 NOTE — Progress Notes (Signed)
Phone [phone] In person visit   Subjective:   Martin VANBENSCHOTEN Sr. is a 74 y.o. year old very pleasant male patient who presents for/with See problem oriented charting Chief Complaint  Patient presents with  . Wound Check    might be infected   This visit occurred during the SARS-CoV-2 public health emergency.  Safety protocols were in place, including screening questions prior to the visit, additional usage of staff PPE, and extensive cleaning of exam room while observing appropriate contact time as indicated for disinfecting solutions.   Past Medical History-  Patient Active Problem List   Diagnosis Date Noted  . Postoperative atrial fibrillation ([place]) 10/24/2018    Priority: High  . 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 Undiagnosed cardiac murmurs 08/15/2015    Priority: High  . DM (diabetes mellitus) type II uncontrolled with eye manifestation ([place]) 02/03/2007    Priority: High  . 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 PAD (peripheral artery disease) ([place]) 08/20/2017    Priority: Medium  . Partial tear of right Achilles tendon 02/12/2017    Priority: Medium  . Aortic atherosclerosis ([place]) 10/07/2016    Priority: Medium  . History of skin cancer 08/15/2015    Priority: Medium  . Diabetic retinopathy ([place]) 02/21/2015    Priority: Medium  . Hyperlipidemia associated with type 2 diabetes mellitus ([place]) 08/24/2014    Priority: Medium  . Diabetic polyneuropathy ([place]) 04/11/2009    Priority: Medium  . Hypertension associated with diabetes ([place]) 02/03/2007    Priority: Medium  . Acute blood loss as cause of postoperative anemia 10/24/2018    Priority: Low  . BPH (benign prostatic hyperplasia) 10/24/2018    Priority: Low  . History of depression 10/24/2018    Priority: Low  . S/P CABG x 4 10/01/2018    Priority: Low  . Onychomycosis of toenail 02/17/2018    Priority: Low  . Allergic rhinitis 08/20/2017     Priority: Low  . C. difficile colitis 09/25/2016    Priority: Low  . History of aspiration pneumonia 09/25/2016    Priority: Low  . Former smoker 08/24/2014    Priority: Low  . Ingrowing toenail 07/19/2014    Priority: Low  . BACK PAIN, LUMBAR, WITH RADICULOPATHY 08/30/2008    Priority: Low    Medications- reviewed and updated Current Outpatient Medications  Medication Sig Dispense Refill  . aspirin 81 MG EC tablet Take 1 tablet (81 mg total) by mouth daily.    Marland Kitchen ezetimibe (ZETIA) 10 MG tablet TAKE 1 TABLET BY MOUTH EVERY DAY 90 tablet 3  . fexofenadine (ALLEGRA) 180 MG tablet Take 180 mg by mouth daily.    . fluticasone (FLONASE) 50 MCG/ACT nasal spray SPRAY 2 SPRAYS INTO EACH NOSTRIL EVERY DAY (Patient taking differently: Place 1-2 sprays into both nostrils daily as needed for allergies. ) 48 mL 1  . insulin aspart (NOVOLOG FLEXPEN) 100 UNIT/ML FlexPen 3 times a day (just before each meal) 25-20-25 units, and pen needles 4/day 25 pen 3  . insulin glargine (LANTUS SOLOSTAR) 100 UNIT/ML Solostar Pen Inject 10 Units into the skin at bedtime. 5 pen PRN  . Insulin Pen Needle (NOVOFINE) 32G X 6 MM MISC USE 5 TIMES DAILY 450 each 2  . losartan (COZAAR) 25 MG tablet Take 0.5 tablets (12.5 mg total) by mouth daily. 90 tablet 0  . metoprolol tartrate (LOPRESSOR) 50 MG tablet Take 1 tablet (  50 mg total) by mouth 2 (two) times daily. 180 tablet 3  . ONETOUCH VERIO test strip CHECK LEVELS 4 TIMES DAILY 150 strip 1  . torsemide (DEMADEX) 20 MG tablet TAKE 2 TABLETS BY MOUTH EVERY DAY (Patient taking differently: Take 20 mg by mouth daily. ) 180 tablet 3  . vitamin C (ASCORBIC ACID) 250 MG tablet Take 250 mg by mouth daily.    Marland Kitchen doxycycline (VIBRAMYCIN) 100 MG capsule Take 1 capsule (100 mg total) by mouth 2 (two) times daily. 14 capsule 0   No current facility-administered medications for this visit.     Objective:  BP 130/70   Pulse 72   Temp (!) 97.2 F (36.2 C) (Temporal)   Ht 6\' 1"   (1.854 m)   Wt 271 lb 3.2 oz (123 kg)   SpO2 98%   BMI 35.78 kg/m  Gen: NAD, resting comfortably CV: RRR no murmurs rubs or gallops Lungs: CTAB no crackles, wheeze, rhonchi Abdomen: soft/nontender/nondistende Ext: no edema Skin: warm, dry 1 x 1 cm ulceration with fat layer exposed beneath head of 5th metatarsal. Callous noted around this area. No pain. No purulence. No obvious redtissue surrounding this.  Neuro: reduced sensation to touch/pain on bottom of feet     Assessment and Plan   # sore on left foot- 5th metatarsal head base S: has been seen wound care in past for same area. Has been doing good for about a year. In July he had blister that has become irritated. He can not get in to wound care until end of September- sept 21st.   7- 8 years ago fell on knee and started to walk more on outside of foot on left foot- worked with wound care and was able to save the foot- there was some concern about possible amputation initially. Did well for at least 2 years. 4th of July was without socks in crocks walking around the house and wore an area open again  - opened small ulceratoin and has worsened since that time.   Known neuropathy.  A/P: diabetic wound at 5th metatarsal head unknown depth. Urgent referral to wound care within plastic surgery - hoping to get in within next week instead of having to wait until sept 21st. No obvious active infection but discussed if cant get in soon I would start with x-ray and consider MRI of foot for possible osteomyelitis evaluation   # anxiety S:increased anxiety due to covid 19.  Doesn't like wearing a mask- seems to bother him. Did not tolerate xanax - worsened his panic.  A/P: poor control- pandemic contributing- counseling would be beneficial - will refer to [facility]   Recommended follow up: keep October visit or sooner if needed Future Appointments  Date Time Provider [place]  01/18/2020  1:15 PM Ricard Dillon, MD  [facility]  02/01/2020 10:00 AM Melrose Nakayama, MD [facility]  02/09/2020  9:40 AM Yong Channel [name], MD [facility]    Lab/Order associations:   ICD-10-CM   1. Diabetic ulcer of left foot associated with type 2 diabetes mellitus, with fat layer exposed, unspecified part of foot ([place])  E11.621 AMB referral to wound care center   L97.522   2. Anxiety  F41.9    Time Spent:  32 minutes of total time (2:34 PM- 3:06 PM) was spent on the date of the encounter performing the following actions: chart review prior to seeing the patient, obtaining history, performing a medically necessary exam, counseling on  the treatment plan, placing orders, and documenting in our EHR.   Return precautions advised.  [name], MD

## 2019-12-20 NOTE — Addendum Note (Signed)
Addended by: Francella Solian on: 12/20/2019 03:26 PM   Modules accepted: Orders

## 2019-12-21 ENCOUNTER — Encounter: Payer: Self-pay | Admitting: Orthopedic Surgery

## 2019-12-21 ENCOUNTER — Ambulatory Visit (INDEPENDENT_AMBULATORY_CARE_PROVIDER_SITE_OTHER): Payer: PPO | Admitting: Orthopedic Surgery

## 2019-12-21 ENCOUNTER — Ambulatory Visit (INDEPENDENT_AMBULATORY_CARE_PROVIDER_SITE_OTHER): Payer: PPO

## 2019-12-21 VITALS — Ht 73.0 in | Wt 271.0 lb

## 2019-12-21 DIAGNOSIS — L97524 Non-pressure chronic ulcer of other part of left foot with necrosis of bone: Secondary | ICD-10-CM | POA: Diagnosis not present

## 2019-12-21 DIAGNOSIS — M86272 Subacute osteomyelitis, left ankle and foot: Secondary | ICD-10-CM

## 2019-12-21 DIAGNOSIS — M79672 Pain in left foot: Secondary | ICD-10-CM

## 2019-12-21 MED ORDER — DOXYCYCLINE HYCLATE 100 MG PO TABS: 100.0000 mg | ORAL_TABLET | Freq: Two times a day (BID) | ORAL | 0 refills | Status: DC

## 2019-12-21 NOTE — Progress Notes (Signed)
Office Visit Note   Patient: Martin KUANG Sr.           Date of Birth: [birth year]           MRN: [id number] Visit Date: 12/21/2019              Requested by: Marin Olp, MD [place],  [place] [address] PCP: Marin Olp, MD  Chief Complaint  Patient presents with  . Left Foot - Open Wound      HPI: Patient is a 74 year old gentleman who presents with a ulcer beneath the fifth metatarsal head left foot.  Patient states he had cultures of this ulcer about 5 years ago was treated at the wound center.  Patient states that the current wound broke down while [place] at the beach.  Patient is status post CABG.  Assessment & Plan: Visit Diagnoses:  1. Left foot pain     Plan: Discussed with the patient with the exposed bone and bony changes that the optimal treatment would be to proceed with a left fifth ray amputation.  Discussed that we could proceed as an outpatient surgery patient states he does have some conflicts with time and would like to proceed in 2 weeks with surgery.  We will start him on doxycycline.  Follow-Up Instructions: No follow-ups on file.   Ortho Exam  Patient is alert, oriented, no adenopathy, well-dressed, normal affect, normal respiratory effort. Examination patient has venous stasis changes with brawny pitting edema the Doppler was used and he has a good dorsalis pedis and posterior tibial pulse which are biphasic.  He has an ulcer beneath the fifth metatarsal head which probes down to bone the ulcer is 10 mm in diameter.  Imaging: No results found. No images are attached to the encounter.  Labs: Lab Results  Component Value Date   HGBA1C 7.6 (H) 07/08/2019   HGBA1C 8.2 (H) 01/06/2019   HGBA1C 8.1 (H) 09/29/2018   REPTSTATUS 12/15/2016 FINAL 12/12/2016   GRAMSTAIN  12/12/2016    FEW WBC PRESENT,BOTH PMN AND MONONUCLEAR NO ORGANISMS SEEN    CULT  12/12/2016    RARE DIPHTHEROIDS(CORYNEBACTERIUM  SPECIES) Standardized susceptibility testing for this organism is not available. Performed at [facility], [place] [address], [place], [place] [address]      Lab Results  Component Value Date   ALBUMIN 4.4 07/20/2019   ALBUMIN 4.2 07/08/2019   ALBUMIN 4.3 03/04/2019    Lab Results  Component Value Date   MG 2.1 07/20/2019   MG 2.8 (H) 10/07/2018   MG 2.5 (H) 10/02/2018   No results found for: VD25OH  No results found for: PREALBUMIN CBC EXTENDED Latest Ref Rng & Units 07/20/2019 07/08/2019 01/06/2019  WBC 4.0 - 10.5 K/uL 10.0 9.0 8.3  RBC 4.22 - 5.81 MIL/uL 4.12(L) 4.22 4.53  HGB 13.0 - 17.0 g/dL 11.7(L) 12.1(L) 12.0(L)  HCT 39 - 52 % 37.2(L) 36.5(L) 37.5(L)  PLT 150 - 400 K/uL 291 320.0 320.0  NEUTROABS 1.7 - 7.7 K/uL 7.4 - -  LYMPHSABS 0.7 - 4.0 K/uL 1.7 - -     Body mass index is 35.75 kg/m.  Orders:  Orders Placed This Encounter  Procedures  . XR Foot Complete Left   No orders of the defined types were placed in this encounter.    Procedures: No procedures performed  Clinical Data: No additional findings.  ROS:  All other systems negative, except as noted in the HPI. Review of  Systems  Objective: Vital Signs: Ht 6\' 1"  (1.854 m)   Wt 271 lb (122.9 kg)   BMI 35.75 kg/m   Specialty Comments:  No specialty comments available.  PMFS History: Patient Active Problem List   Diagnosis Date Noted  . 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 Postoperative atrial fibrillation ([place]) 10/24/2018  . Acute blood loss as cause of postoperative anemia 10/24/2018  . BPH (benign prostatic hyperplasia) 10/24/2018  . History of depression 10/24/2018  . 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 Onychomycosis of toenail 02/17/2018  . Allergic rhinitis 08/20/2017  . PAD (peripheral artery disease) ([place]) 08/20/2017  . Partial tear of right Achilles tendon 02/12/2017  . Aortic atherosclerosis ([place]) 10/07/2016  . C.  difficile colitis 09/25/2016  . History of aspiration pneumonia 09/25/2016  . History of skin cancer 08/15/2015  . Undiagnosed cardiac murmurs 08/15/2015  . Diabetic retinopathy ([place]) 02/21/2015  . Former smoker 08/24/2014  . Hyperlipidemia associated with type 2 diabetes mellitus ([place]) 08/24/2014  . Ingrowing toenail 07/19/2014  . Diabetic polyneuropathy ([place]) 04/11/2009  . BACK PAIN, LUMBAR, WITH RADICULOPATHY 08/30/2008  . DM (diabetes mellitus) type II uncontrolled with eye manifestation ([place]) 02/03/2007  . Hypertension associated with diabetes ([place]) 02/03/2007   Past Medical History:  Diagnosis Date  . A-fib ([place])   . Diabetes mellitus   . Heart attack ([place]) 10/01/2018   Pt had open heart surgery  . Hypertension   . Tobacco abuse     Family History  Problem Relation Age of Onset  . Hypertension Mother   . Heart disease Mother        CHF did not see doctor  . Diabetes Maternal Grandmother   . Diabetes Son     Past Surgical History:  Procedure Laterality Date  . CORONARY ARTERY BYPASS GRAFT N/A 10/01/2018   Procedure: CORONARY ARTERY BYPASS GRAFTING (CABG) TIMES  FOUR USING LEFT MAMMARY ARTERY AND RIGHT GREATER SAPHEANOUS VEIN HARVESTED ENDOSCOPICALLY;  Surgeon: Melrose Nakayama, MD;  Location: [facility];  Service: Open Heart Surgery;  Laterality: N/A;  . HERNIA REPAIR     >10 years ago  . IR RADIOLOGIST EVAL & MGMT  12/24/2016  . IR THORACENTESIS ASP PLEURAL SPACE W/IMG GUIDE  10/06/2018  . LEFT HEART CATH AND CORONARY ANGIOGRAPHY N/A 09/29/2018   Procedure: LEFT HEART CATH AND CORONARY ANGIOGRAPHY;  Surgeon: Troy Sine, MD;  Location: [facility];  Service: Cardiovascular;  Laterality: N/A;  . open heart surfery    . right shoulder surgery     around 2014  . TEE WITHOUT CARDIOVERSION N/A 10/01/2018   Procedure: TRANSESOPHAGEAL ECHOCARDIOGRAM (TEE);  Surgeon: Melrose Nakayama, MD;  Location: [facility];  Service: Open Heart Surgery;  Laterality: N/A;   Social  History   Occupational History  . Not on file  Tobacco Use  . Smoking status: Former Smoker    Packs/day: 0.75    Years: 2.00    Pack years: 1.50    Types: Cigarettes    Start date: 2002    Quit date: 04/29/2002    Years since quitting: 17.6  . Smokeless tobacco: Never Used  . Tobacco comment: smoked for 10 years on and off  Substance and Sexual Activity  . Alcohol use: Yes    Alcohol/week: 0.0 standard drinks    Comment: 6 martinis a year  . Drug use: No  . Sexual activity: Yes

## 2019-12-22 ENCOUNTER — Other Ambulatory Visit (HOSPITAL_COMMUNITY)
Admission: RE | Admit: 2019-12-22 | Discharge: 2019-12-22 | Disposition: A | Discharge status: Home / Self Care | Payer: PPO | Source: Other Acute Inpatient Hospital | Attending: Physician Assistant | Admitting: Physician Assistant

## 2019-12-22 ENCOUNTER — Encounter (HOSPITAL_BASED_OUTPATIENT_CLINIC_OR_DEPARTMENT_OTHER): Payer: PPO | Attending: Internal Medicine | Admitting: Physician Assistant

## 2019-12-22 DIAGNOSIS — E1151 Type 2 diabetes mellitus with diabetic peripheral angiopathy without gangrene: Secondary | ICD-10-CM | POA: Insufficient documentation

## 2019-12-22 DIAGNOSIS — I10 Essential (primary) hypertension: Secondary | ICD-10-CM | POA: Diagnosis not present

## 2019-12-22 DIAGNOSIS — Z6836 Body mass index (BMI) 36.0-36.9, adult: Secondary | ICD-10-CM | POA: Diagnosis not present

## 2019-12-22 DIAGNOSIS — E11621 Type 2 diabetes mellitus with foot ulcer: Secondary | ICD-10-CM | POA: Diagnosis not present

## 2019-12-22 DIAGNOSIS — L089 Local infection of the skin and subcutaneous tissue, unspecified: Secondary | ICD-10-CM | POA: Diagnosis not present

## 2019-12-22 DIAGNOSIS — E785 Hyperlipidemia, unspecified: Secondary | ICD-10-CM | POA: Diagnosis not present

## 2019-12-22 DIAGNOSIS — Z794 Long term (current) use of insulin: Secondary | ICD-10-CM | POA: Diagnosis not present

## 2019-12-22 DIAGNOSIS — Z87891 Personal history of nicotine dependence: Secondary | ICD-10-CM | POA: Diagnosis not present

## 2019-12-22 DIAGNOSIS — L97522 Non-pressure chronic ulcer of other part of left foot with fat layer exposed: Secondary | ICD-10-CM | POA: Diagnosis not present

## 2019-12-22 DIAGNOSIS — Z888 Allergy status to other drugs, medicaments and biological substances status: Secondary | ICD-10-CM | POA: Diagnosis not present

## 2019-12-22 DIAGNOSIS — E669 Obesity, unspecified: Secondary | ICD-10-CM | POA: Insufficient documentation

## 2019-12-22 DIAGNOSIS — I2510 Atherosclerotic heart disease of native coronary artery without angina pectoris: Secondary | ICD-10-CM | POA: Diagnosis not present

## 2019-12-22 DIAGNOSIS — Z881 Allergy status to other antibiotic agents status: Secondary | ICD-10-CM | POA: Insufficient documentation

## 2019-12-22 DIAGNOSIS — I779 Disorder of arteries and arterioles, unspecified: Secondary | ICD-10-CM | POA: Diagnosis not present

## 2019-12-22 DIAGNOSIS — Z833 Family history of diabetes mellitus: Secondary | ICD-10-CM | POA: Diagnosis not present

## 2019-12-22 DIAGNOSIS — E1142 Type 2 diabetes mellitus with diabetic polyneuropathy: Secondary | ICD-10-CM | POA: Insufficient documentation

## 2019-12-23 ENCOUNTER — Other Ambulatory Visit: Payer: Self-pay | Admitting: Physician Assistant

## 2019-12-23 ENCOUNTER — Encounter: Payer: Self-pay | Admitting: Orthopedic Surgery

## 2019-12-23 DIAGNOSIS — E13621 Other specified diabetes mellitus with foot ulcer: Secondary | ICD-10-CM

## 2019-12-23 DIAGNOSIS — L97521 Non-pressure chronic ulcer of other part of left foot limited to breakdown of skin: Secondary | ICD-10-CM | POA: Diagnosis not present

## 2019-12-23 NOTE — Progress Notes (Signed)
HOLMAN, BONSIGNORE ([id number]) Visit Report for 12/22/2019 Abuse/Suicide Risk Screen Details Patient Name: Date of Service: Martin [name], Martin [name] 12/22/2019 1:15 PM Medical Record Number: [id number] Patient Account Number: [id number] Date of Birth/Sex: Treating RN: August 21, 1945 (74 y.o. Male) [name] Primary Care Orvetta Danielski: [name] Other Clinician: Referring Tedra Coppernoll: Treating Maridee Slape/Extender: Sharalyn Ink in Treatment: 0 Abuse/Suicide Risk Screen Items Answer ABUSE RISK SCREEN: Has anyone close to you tried to hurt or harm you recentlyo No Do you feel uncomfortable with anyone in your familyo No Has anyone forced you do things that you didnt want to doo No Electronic Signature(s) Signed: 12/23/2019 4:47:33 PM By: [name] RN, BSN Entered By: [name] on 12/22/2019 13:56:54 -------------------------------------------------------------------------------- Activities of Daily Living Details Patient Name: Date of Service: Martin [name] 12/22/2019 1:15 PM Medical Record Number: [id number] Patient Account Number: [id number] Date of Birth/Sex: Treating RN: 03-11-1946 (74 y.o. Male) [name] Primary Care Jakyron Fabro: [name] Other Clinician: Referring Chanse Kagel: Treating Anelis Hrivnak/Extender: Sharalyn Ink in Treatment: 0 Activities of Daily Living Items Answer Activities of Daily Living (Please select one for each item) Drive Automobile Completely Able T Medications ake Completely Able Use T elephone Completely Able Care for Appearance Completely Able Use T oilet Completely Able Bath / Shower Completely Able Dress Self Completely Able Feed Self Completely Able Walk Completely Able Get In / Out Bed Completely Able Housework Completely Able Prepare Meals Completely [place] for Self Completely Able Electronic Signature(s) Signed: 12/23/2019 4:47:33 PM By: [name] RN, BSN Entered By: [name] on  12/22/2019 13:57:17 -------------------------------------------------------------------------------- Education Screening Details Patient Name: Date of Service: Martin [name] 12/22/2019 1:15 PM Medical Record Number: [id number] Patient Account Number: [id number] Date of Birth/Sex: Treating RN: January 24, 1946 (74 y.o. Male) [name] Primary Care Timothey Dahlstrom: [name] Other Clinician: Referring Meko Bellanger: Treating Herberth Deharo/Extender: Sharalyn Ink in Treatment: 0 Learning Preferences/Education Level/Primary Language Learning Preference: Explanation Highest Education Level: College or Above Preferred Language: English Cognitive Barrier Language Barrier: No Translator Needed: No Memory Deficit: No Emotional Barrier: No Cultural/Religious Beliefs Affecting Medical Care: No Physical Barrier Impaired Vision: No Impaired Hearing: No Decreased Hand dexterity: No Knowledge/Comprehension Knowledge Level: High Comprehension Level: High Ability to understand written instructions: High Ability to understand verbal instructions: High Motivation Anxiety Level: Calm Cooperation: Cooperative Education Importance: Acknowledges Need Interest in Health Problems: Asks Questions Perception: Coherent Willingness to Engage in Self-Management High Activities: Readiness to Engage in Self-Management High Activities: Electronic Signature(s) Signed: 12/23/2019 4:47:33 PM By: [name] RN, BSN Entered By: [name] on 12/22/2019 13:58:02 -------------------------------------------------------------------------------- Fall Risk Assessment Details Patient Name: Date of Service: Martin [name]. 12/22/2019 1:15 PM Medical Record Number: [id number] Patient Account Number: [id number] Date of Birth/Sex: Treating RN: 1945/11/29 (74 y.o. Male) [name] Primary Care Tanashia Ciesla: [name] Other Clinician: Referring Jaylenne Hamelin: Treating Kathy Wares/Extender: Sharalyn Ink in Treatment: 0 Fall Risk Assessment Items Have you had 2 or more falls in the last 12 monthso 0 No Have you had any fall that resulted in injury in the last 12 monthso 0 No FALLS RISK SCREEN History of falling - immediate or within 3 months 0 No Secondary diagnosis (Do you have 2 or more medical diagnoseso) 15 Yes Ambulatory aid None/bed rest/wheelchair/nurse 0 No Crutches/cane/walker 15 Yes Furniture 0 No Intravenous therapy Access/Saline/Heparin Lock 0 No Gait/Transferring Normal/ bed rest/ wheelchair 0 Yes Weak (short steps with or without shuffle, stooped but able to lift head while walking, may seek 0  No support from furniture) Impaired (short steps with shuffle, may have difficulty arising from chair, head down, impaired 0 No balance) Mental Status Oriented to own ability 0 Yes Electronic Signature(s) Signed: 12/23/2019 4:47:33 PM By: [name] RN, BSN Entered By: [name] on 12/22/2019 13:58:31 -------------------------------------------------------------------------------- Foot Assessment Details Patient Name: Date of Service: Martin [name] 12/22/2019 1:15 PM Medical Record Number: [id number] Patient Account Number: [id number] Date of Birth/Sex: Treating RN: 08/01/1945 (74 y.o. Male) [name] Primary Care Ariely Riddell: [name] Other Clinician: Referring Roshanda Balazs: Treating Reia Viernes/Extender: Sharalyn Ink in Treatment: 0 Foot Assessment Items Site Locations + = Sensation present, - = Sensation absent, C = Callus, U = Ulcer R = Redness, W = Warmth, M = Maceration, PU = Pre-ulcerative lesion F = Fissure, S = Swelling, D = Dryness Assessment Right: Left: Other Deformity: No No Prior Foot Ulcer: No No Prior Amputation: No No Charcot Joint: No No Ambulatory Status: Ambulatory With Help Assistance Device: Cane Gait: Steady Electronic Signature(s) Signed: 12/23/2019 4:47:33 PM By: [name] RN, BSN Entered By: [name] on 12/22/2019 14:05:47 -------------------------------------------------------------------------------- Nutrition Risk Screening Details Patient Name: Date of Service: [name], [name] 12/22/2019 1:15 PM Medical Record Number: [id number] Patient Account Number: [id number] Date of Birth/Sex: Treating RN: [birth year] (74 y.o. Male) [name] Primary Care Rankin Coolman: [name] Other Clinician: Referring Ayesha Markwell: Treating Shelene Krage/Extender: Sharalyn Ink in Treatment: 0 Height (in): 72 Weight (lbs): 270 Body Mass Index (BMI): 36.6 Nutrition Risk Screening Items Score Screening NUTRITION RISK SCREEN: I have an illness or condition that made me change the kind and/or amount of food I eat 2 Yes I eat fewer than two meals per day 0 No I eat few fruits and vegetables, or milk products 0 No I have three or more drinks of beer, liquor or wine almost every day 0 No I have tooth or mouth problems that make it hard for me to eat 0 No I don't always have enough money to buy the food I need 0 No I eat alone most of the time 0 No I take three or more different prescribed or over-the-counter drugs a day 1 Yes Without wanting to, I have lost or gained 10 pounds in the last six months 0 No I am not always physically able to shop, cook and/or feed myself 0 No Nutrition Protocols Good Risk Protocol Moderate Risk Protocol 0 Provide education on nutrition High Risk Proctocol Risk Level: Moderate Risk Score: 3 Electronic Signature(s) Signed: 12/23/2019 4:47:33 PM By: [name] RN, BSN Entered By: [name] on 12/22/2019 13:58:40

## 2019-12-25 LAB — AEROBIC CULTURE W GRAM STAIN (SUPERFICIAL SPECIMEN): Gram Stain: NONE SEEN

## 2019-12-28 ENCOUNTER — Other Ambulatory Visit: Payer: Self-pay | Admitting: Physician Assistant

## 2019-12-29 ENCOUNTER — Encounter (HOSPITAL_BASED_OUTPATIENT_CLINIC_OR_DEPARTMENT_OTHER): Payer: PPO | Attending: Physician Assistant | Admitting: Physician Assistant

## 2019-12-29 ENCOUNTER — Ambulatory Visit (HOSPITAL_COMMUNITY)
Admission: RE | Admit: 2019-12-29 | Discharge: 2019-12-29 | Disposition: A | Discharge status: Home / Self Care | Payer: PPO | Source: Ambulatory Visit | Attending: Physician Assistant | Admitting: Physician Assistant

## 2019-12-29 ENCOUNTER — Ambulatory Visit (INDEPENDENT_AMBULATORY_CARE_PROVIDER_SITE_OTHER)
Admission: RE | Admit: 2019-12-29 | Discharge: 2019-12-29 | Disposition: A | Discharge status: Home / Self Care | Payer: PPO | Source: Ambulatory Visit | Attending: Physician Assistant | Admitting: Physician Assistant

## 2019-12-29 ENCOUNTER — Other Ambulatory Visit (HOSPITAL_COMMUNITY): Payer: Self-pay | Admitting: Physician Assistant

## 2019-12-29 ENCOUNTER — Other Ambulatory Visit: Payer: Self-pay

## 2019-12-29 DIAGNOSIS — M869 Osteomyelitis, unspecified: Secondary | ICD-10-CM

## 2019-12-29 DIAGNOSIS — E11621 Type 2 diabetes mellitus with foot ulcer: Secondary | ICD-10-CM

## 2019-12-29 DIAGNOSIS — I2510 Atherosclerotic heart disease of native coronary artery without angina pectoris: Secondary | ICD-10-CM | POA: Diagnosis not present

## 2019-12-29 DIAGNOSIS — L97509 Non-pressure chronic ulcer of other part of unspecified foot with unspecified severity: Secondary | ICD-10-CM

## 2019-12-29 DIAGNOSIS — L97522 Non-pressure chronic ulcer of other part of left foot with fat layer exposed: Secondary | ICD-10-CM

## 2019-12-29 DIAGNOSIS — I10 Essential (primary) hypertension: Secondary | ICD-10-CM | POA: Diagnosis not present

## 2019-12-29 DIAGNOSIS — E1169 Type 2 diabetes mellitus with other specified complication: Secondary | ICD-10-CM | POA: Diagnosis not present
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
## 2019-12-29 NOTE — Progress Notes (Signed)
DAVIYON, WIDMAYER ([id number]) Visit Report for 12/29/2019 Arrival Information Details Patient Name: Date of Service: NAOMI, FITTON 12/29/2019 9:15 A M Medical Record Number: [id number] Patient Account Number: [id number] Date of Birth/Sex: Treating RN: 01/04/1946 (74 y.o. [name], [name] Primary Care Walta [place]: [name] Other Clinician: Referring Simona Rocque: Treating Rory Montel/Extender: Sandria Bales in Treatment: 1 Visit Information History Since Last Visit Added or deleted any medications: No Patient Arrived: [name] Any new allergies or adverse reactions: No Arrival Time: 09:46 Had a fall or experienced change in No Accompanied By: wife activities of daily living that may affect Transfer Assistance: None risk of falls: Patient Identification Verified: Yes Signs or symptoms of abuse/neglect since last visito No Secondary Verification Process Completed: Yes Hospitalized since last visit: No Patient Requires Transmission-Based Precautions: No Implantable device outside of the clinic excluding No Patient Has Alerts: No cellular tissue based products placed in the center since last visit: Has Dressing in Place as Prescribed: Yes Pain Present Now: No Electronic Signature(s) Signed: 12/29/2019 11:00:36 AM By: Sandre Kitty Entered By: Sandre Kitty on 12/29/2019 09:46:28 -------------------------------------------------------------------------------- Encounter Discharge Information Details Patient Name: Date of Service: [name] 12/29/2019 9:15 A M Medical Record Number: [id number] Patient Account Number: [id number] Date of Birth/Sex: Treating RN: [birth year] (74 y.o. [name]) Carlene Coria Primary Care Gustie Bobb: [name] Other Clinician: Referring Kiano Terrien: Treating Rima Blizzard/Extender: Sandria Bales in Treatment: 1 Encounter Discharge Information Items Post Procedure Vitals Discharge Condition: Stable Temperature (F):  98.4 Ambulatory Status: Cane Pulse (bpm): 77 Discharge Destination: Home Respiratory Rate (breaths/min): 18 Transportation: Private Auto Blood Pressure (mmHg): 137/78 Accompanied By: self Schedule Follow-up Appointment: Yes Clinical Summary of Care: Patient Declined Electronic Signature(s) Signed: 12/29/2019 5:45:10 PM By: Carlene Coria RN Entered By: Carlene Coria on 12/29/2019 10:59:24 -------------------------------------------------------------------------------- Lower Extremity Assessment Details Patient Name: Date of Service: FILOMENO, CROMLEY 12/29/2019 9:15 A M Medical Record Number: [id number] Patient Account Number: [id number] Date of Birth/Sex: Treating RN: Sep 03, 1945 (74 y.o. [name] Primary Care Latroya Ng: [name] Other Clinician: Referring Azalia Neuberger: Treating Antwuan Eckley/Extender: Sandria Bales in Treatment: 1 Edema Assessment Assessed: [Left: No] [Right: No] Edema: [Left: Ye] [Right: s] Calf Left: Right: Point of Measurement: cm From Medial Instep 38 cm cm Ankle Left: Right: Point of Measurement: cm From Medial Instep 26 cm cm Electronic Signature(s) Signed: 12/29/2019 5:45:10 PM By: Carlene Coria RN Entered By: Carlene Coria on 12/29/2019 10:10:47 -------------------------------------------------------------------------------- Dimmit Details Patient Name: Date of Service: [name] 12/29/2019 9:15 A M Medical Record Number: [id number] Patient Account Number: [id number] Date of Birth/Sex: Treating RN: Dec 11, 1945 (74 y.o. [name] Primary Care Mauria Asquith: [name] Other Clinician: Referring Stevan Eberwein: Treating Oshen Wlodarczyk/Extender: Sandria Bales in Treatment: 1 Active Inactive Nutrition Nursing Diagnoses: Impaired glucose control: actual or potential Potential for alteratiion in Nutrition/Potential for imbalanced nutrition Goals: Patient/caregiver will maintain  therapeutic glucose control Date Initiated: 12/22/2019 Target Resolution Date: 01/19/2020 Goal Status: Active Interventions: Assess HgA1c results as ordered upon admission and as needed Treatment Activities: Patient referred to Primary Care Physician for further nutritional evaluation : 12/22/2019 Notes: Wound/Skin Impairment Nursing Diagnoses: Impaired tissue integrity Knowledge deficit related to ulceration/compromised skin integrity Goals: Patient/caregiver will verbalize understanding of skin care regimen Date Initiated: 12/22/2019 Target Resolution Date: 01/19/2020 Goal Status: Active Ulcer/skin breakdown will have a volume reduction of 30% by week 4 Date Initiated: 12/22/2019 Target Resolution Date: 01/19/2020 Goal Status: Active Interventions: Assess patient/caregiver ability to obtain  necessary supplies Assess patient/caregiver ability to perform ulcer/skin care regimen upon admission and as needed Assess ulceration(s) every visit Provide education on ulcer and skin care Treatment Activities: Skin care regimen initiated : 12/22/2019 Topical wound management initiated : 12/22/2019 Notes: Electronic Signature(s) Signed: 12/29/2019 6:44:49 PM By: Baruch Gouty RN, BSN Entered By: Baruch Gouty on 12/29/2019 10:23:30 -------------------------------------------------------------------------------- Pain Assessment Details Patient Name: Date of Service: [name] 12/29/2019 9:15 A M Medical Record Number: [id number] Patient Account Number: [id number] Date of Birth/Sex: Treating RN: [birth year] (74 y.o. [name] Primary Care Briell [name]: [name] Other Clinician: Referring Mazikeen Hehn: Treating Jaimen Melone/Extender: Sandria Bales in Treatment: 1 Active Problems Location of Pain Severity and Description of Pain Patient Has Paino No Site Locations Pain Management and Medication Current Pain Management: Electronic Signature(s) Signed:  12/29/2019 11:00:36 AM By: Sandre Kitty Signed: 12/29/2019 6:44:49 PM By: Baruch Gouty RN, BSN Signed: 12/29/2019 6:44:49 PM By: Baruch Gouty RN, BSN Entered By: Sandre Kitty on 12/29/2019 09:46:52 -------------------------------------------------------------------------------- Patient/Caregiver Education Details Patient Name: Date of Service: REZA, CRYMES 9/1/2021andnbsp9:15 A M Medical Record Number: [id number] Patient Account Number: [id number] Date of Birth/Gender: Treating RN: October 10, 1945 (74 y.o. [name] Primary Care Physician: [name] Other Clinician: Referring Physician: Treating Physician/Extender: Sandria Bales in Treatment: 1 Education Assessment Education Provided To: Patient Education Topics Provided Offloading: Methods: Explain/Verbal Responses: Reinforcements needed, State content correctly Wound/Skin Impairment: Methods: Explain/Verbal Responses: Reinforcements needed, State content correctly Electronic Signature(s) Signed: 12/29/2019 6:44:49 PM By: Baruch Gouty RN, BSN Entered By: Baruch Gouty on 12/29/2019 10:24:04 -------------------------------------------------------------------------------- Wound Assessment Details Patient Name: Date of Service: [name] 12/29/2019 9:15 A M Medical Record Number: [id number] Patient Account Number: [id number] Date of Birth/Sex: Treating RN: Nov 06, 1945 (74 y.o. [name] Primary Care Kapena Hamme: [name] Other Clinician: Referring Athene Schuhmacher: Treating Imajean [name]/Extender: Sandria Bales in Treatment: 1 Wound Status Wound Number: 5 Primary Diabetic Wound/Ulcer of the Lower Extremity Etiology: Wound Location: Left Metatarsal head fifth Wound Open Wounding Event: Gradually Appeared Status: Date Acquired: 10/31/2019 Comorbid Cataracts, Coronary Artery Disease, Hypertension, Type II Weeks Of Treatment: 1 History: Diabetes,  Neuropathy Clustered Wound: No Wound Measurements Length: (cm) 0.9 Width: (cm) 1.5 Depth: (cm) 0.4 Area: (cm) 1.06 Volume: (cm) 0.424 % Reduction in Area: 12.4% % Reduction in Volume: 49.9% Epithelialization: None Tunneling: No Undermining: No Wound Description Classification: Grade 2 Wound Margin: Well defined, not attached Exudate Amount: Medium Exudate Type: Serosanguineous Exudate Color: red, brown Foul Odor After Cleansing: No Slough/Fibrino Yes Wound Bed Granulation Amount: Medium (34-66%) Exposed Structure Granulation Quality: Red Fascia Exposed: No Necrotic Amount: Medium (34-66%) Fat Layer (Subcutaneous Tissue) Exposed: Yes Necrotic Quality: Adherent Slough Tendon Exposed: No Muscle Exposed: No Joint Exposed: No Bone Exposed: No Treatment Notes Wound #5 (Left Metatarsal head fifth) 1. Cleanse With Wound Cleanser 3. Primary Dressing Applied Calcium Alginate Ag 4. Secondary Dressing Dry Gauze Roll Gauze Foam 5. Secured With Tape Notes front off loading shoe to the left foot Electronic Signature(s) Signed: 12/29/2019 5:45:10 PM By: Carlene Coria RN Signed: 12/29/2019 6:44:49 PM By: Baruch Gouty RN, BSN Entered By: Carlene Coria on 12/29/2019 10:11:48 -------------------------------------------------------------------------------- Vitals Details Patient Name: Date of Service: [name] 12/29/2019 9:15 A M Medical Record Number: [id number] Patient Account Number: [id number] Date of Birth/Sex: Treating RN: [birth year] (74 y.o. [name] Primary Care Daliya Parchment: [name] Other Clinician: Referring Jac Romulus: Treating Angelena Sand/Extender: Sandria Bales in Treatment: 1 Vital Signs Time  Taken: 09:46 Temperature (F): 98.4 Height (in): 72 Pulse (bpm): 77 Weight (lbs): 270 Respiratory Rate (breaths/min): 18 Body Mass Index (BMI): 36.6 Blood Pressure (mmHg): 137/78 Reference Range: 80 - 120 mg / dl Electronic  Signature(s) Signed: 12/29/2019 11:00:36 AM By: Sandre Kitty Entered By: Sandre Kitty on 12/29/2019 09:46:46

## 2019-12-30 ENCOUNTER — Other Ambulatory Visit: Payer: Self-pay

## 2020-01-04 ENCOUNTER — Telehealth: Payer: Self-pay | Admitting: Orthopedic Surgery

## 2020-01-04 MED ORDER — DEXTROSE 5 % IV SOLN
3.0000 g | INTRAVENOUS | Status: DC
  Filled 2020-01-04: qty 3000

## 2020-01-04 NOTE — Telephone Encounter (Signed)
Lets see in 3 weeks to follow up on his foot

## 2020-01-04 NOTE — Telephone Encounter (Signed)
Got it.

## 2020-01-04 NOTE — Telephone Encounter (Signed)
[name] left a message on my voicemail asking me to cancel his 5th ray amputation that is scheduled for tomorrow, Wed 01/05/20.  He stated that he is not going to have surgery on his foot.  I cancelled surgery with [facility].

## 2020-01-05 ENCOUNTER — Other Ambulatory Visit: Payer: Self-pay

## 2020-01-05 ENCOUNTER — Encounter (HOSPITAL_BASED_OUTPATIENT_CLINIC_OR_DEPARTMENT_OTHER): Payer: PPO | Admitting: Physician Assistant

## 2020-01-05 ENCOUNTER — Encounter (HOSPITAL_COMMUNITY): Admission: RE | Payer: Self-pay | Source: Home / Self Care

## 2020-01-05 ENCOUNTER — Telehealth: Payer: Self-pay | Admitting: Orthopedic Surgery

## 2020-01-05 ENCOUNTER — Ambulatory Visit (HOSPITAL_COMMUNITY): Admission: RE | Admit: 2020-01-05 | Payer: PPO | Source: Home / Self Care | Admitting: Orthopedic Surgery

## 2020-01-05 DIAGNOSIS — I10 Essential (primary) hypertension: Secondary | ICD-10-CM | POA: Diagnosis not present

## 2020-01-05 DIAGNOSIS — E11621 Type 2 diabetes mellitus with foot ulcer: Secondary | ICD-10-CM | POA: Diagnosis not present

## 2020-01-05 DIAGNOSIS — L97522 Non-pressure chronic ulcer of other part of left foot with fat layer exposed: Secondary | ICD-10-CM | POA: Diagnosis not present

## 2020-01-05 SURGERY — AMPUTATION, FOOT, RAY
Anesthesia: Choice | Laterality: Left

## 2020-01-05 NOTE — Telephone Encounter (Signed)
Would you please call this pt and make an appt for him to see Dr. Sharol Given in three weeks please? Thank you!

## 2020-01-05 NOTE — Progress Notes (Addendum)
[name], [name] ([id number]) Visit Report for 01/05/2020 Chief Complaint Document Details Patient Name: Date of Service: Martin [name], Martin [name] 01/05/2020 12:30 PM Medical Record Number: [id number] Patient Account Number: [id number] Date of Birth/Sex: Treating RN: [birth year] (74 y.o. [name] Primary Care Provider: [name] Other Clinician: Referring Provider: Treating Provider/Extender: Sandria Bales in Treatment: 2 Information Obtained from: Patient Chief Complaint Left foot ulcer Electronic Signature(s) Signed: 01/05/2020 1:03:15 PM By: Worthy Keeler PA-C Entered By: Worthy Keeler on 01/05/2020 13:03:15 -------------------------------------------------------------------------------- Physician Orders Details Patient Name: Date of Service: [name], [name] 01/05/2020 12:30 PM Medical Record Number: [id number] Patient Account Number: [id number] Date of Birth/Sex: Treating RN: 02/28/1946 (74 y.o. [name], [name] Primary Care Provider: [name] Other Clinician: Referring Provider: Treating Provider/Extender: Sandria Bales in Treatment: 2 Verbal / Phone Orders: No Diagnosis Coding ICD-10 Coding Code Description E11.621 Type 2 diabetes mellitus with foot ulcer L97.522 Non-pressure chronic ulcer of other part of left foot with fat layer exposed I10 Essential (primary) hypertension I25.10 Atherosclerotic heart disease of native coronary artery without angina pectoris Follow-up Appointments Return Appointment in 1 week. Dressing Change Frequency Wound #5 Left Metatarsal head fifth Change dressing every day. Wound Cleansing Wound #5 Left Metatarsal head fifth Clean wound with Normal Saline. May shower with protection. Primary Wound Dressing Wound #5 Left Metatarsal head fifth lginate with Silver - pack lightly to fill space Calcium A Secondary Dressing Wound #5 Left Metatarsal head fifth Foam -  donut Kerlix/Rolled Gauze Dry Gauze Off-Loading Wedge shoe to: - front offloading shoe Patient Medications llergies: Statins-Hmg-Coa Reductase Inhibitors, simvastatin A Notifications Medication Indication Start End 01/06/2020 clindamycin HCl DOSE 1 - oral 300 mg capsule - 1 capsule oral taken 3 times per days for 14 days Electronic Signature(s) Signed: 01/05/2020 1:07:18 PM By: Worthy Keeler PA-C Entered By: Worthy Keeler on 01/05/2020 13:07:17 -------------------------------------------------------------------------------- Problem List Details Patient Name: Date of Service: [name] 01/05/2020 12:30 PM Medical Record Number: [id number] Patient Account Number: [id number] Date of Birth/Sex: Treating RN: [birth year] (74 y.o. [name] Primary Care Provider: [name] Other Clinician: Referring Provider: Treating Provider/Extender: Sandria Bales in Treatment: 2 Active Problems ICD-10 Encounter Code Description Active Date MDM Diagnosis E11.621 Type 2 diabetes mellitus with foot ulcer 12/22/2019 No Yes L97.522 Non-pressure chronic ulcer of other part of left foot with fat layer exposed 12/22/2019 No Yes I10 Essential (primary) hypertension 12/22/2019 No Yes I25.10 Atherosclerotic heart disease of native coronary artery without angina pectoris 12/22/2019 No Yes Inactive Problems Resolved Problems Electronic Signature(s) Signed: 01/05/2020 1:03:03 PM By: Worthy Keeler PA-C Entered By: Worthy Keeler on 01/05/2020 13:03:03

## 2020-01-05 NOTE — Telephone Encounter (Signed)
Called patient no answering machine pick up. Will try to contact patemt  again later to schedule a 3 wk Rov with Dr. Sharol Given.

## 2020-01-06 NOTE — Progress Notes (Signed)
[name], [name] ([id number]) Visit Report for 01/05/2020 Allergy List Details Patient Name: Date of Service: Martin [name], Martin [name] 01/05/2020 12:30 PM Medical Record Number: [id number] Patient Account Number: [id number] Date of Birth/Sex: Treating RN: Sep 26, 1945 (74 y.o. Martin [name] Primary Care Martin [name]: Martin [name] Other Clinician: Referring Martin [name]: Treating Martin [name]/Extender: Martin [name] in Treatment: 2 Allergies Active Allergies Statins-Hmg-Coa Reductase Inhibitors Reaction: muscle weakness, elevated liver enzymes Severity: Moderate simvastatin Severity: Moderate Levaquin Reaction: muscle pain, fatigue Allergy Notes Electronic Signature(s) Signed: 01/05/2020 5:28:47 PM By: Baruch Gouty RN, BSN Entered By: Baruch Gouty on 01/05/2020 13:08:53 -------------------------------------------------------------------------------- Arrival Information Details Patient Name: Date of Service: Martin [name]. 01/05/2020 12:30 PM Medical Record Number: [id number] Patient Account Number: [id number] Date of Birth/Sex: Treating RN: Sep 12, 1945 (74 y.o. Martin [name] Primary Care Martin [name]: Martin [name] Other Clinician: Referring Martin [name]: Treating Martin [name]/Extender: Martin [name] in Treatment: 2 Visit Information Patient Arrived: Martin [name] Time: 12:47 Accompanied By: wife Transfer Assistance: None Patient Identification Verified: Yes Secondary Verification Process Completed: Yes Patient Requires Transmission-Based Precautions: No Patient Has Alerts: No History Since Last Visit Added or deleted any medications: No Any new allergies or adverse reactions: No Had a fall or experienced change in activities of daily living that may affect risk of falls: No Signs or symptoms of abuse/neglect since last visito No Hospitalized since last visit: No Implantable device outside of the clinic excluding cellular tissue based products  placed in the center since last visit: No Has Dressing in Place as Prescribed: Yes Has Footwear/Offloading in Place as Prescribed: Yes Left: Wedge Shoe Pain Present Now: No Electronic Signature(s) Signed: 01/06/2020 5:37:15 PM By: [name] RN, BSN Entered By: [name] on 01/05/2020 12:48:18 -------------------------------------------------------------------------------- Clinic Level of Care Assessment Details Patient Name: Date of Service: Martin [name], Martin [name] 01/05/2020 12:30 PM Medical Record Number: [id number] Patient Account Number: [id number] Date of Birth/Sex: Treating RN: Feb 19, 1946 (74 y.o. Martin [name] Primary Care Martin [name]: Martin [name] Other Clinician: Referring Martin [name]: Treating Martin [name]/Extender: Martin [name] in Treatment: 2 Clinic Level of Care Assessment Items TOOL 4 Quantity Score []  - 0 Use when only an EandM is performed on FOLLOW-UP visit ASSESSMENTS - Nursing Assessment / Reassessment X- 1 10 Reassessment of Co-morbidities (includes updates in patient status) X- 1 5 Reassessment of Adherence to Treatment Plan ASSESSMENTS - Wound and Skin A ssessment / Reassessment X - Simple Wound Assessment / Reassessment - one wound 1 5 []  - 0 Complex Wound Assessment / Reassessment - multiple wounds []  - 0 Dermatologic / Skin Assessment (not related to wound area) ASSESSMENTS - Focused Assessment X- 1 5 Circumferential Edema Measurements - multi extremities []  - 0 Nutritional Assessment / Counseling / Intervention X- 1 5 Lower Extremity Assessment (monofilament, tuning fork, pulses) []  - 0 Peripheral Arterial Disease Assessment (using hand held doppler) ASSESSMENTS - Ostomy and/or Continence Assessment and Care []  - 0 Incontinence Assessment and Management []  - 0 Ostomy Care Assessment and Management (repouching, etc.) PROCESS - Coordination of Care X - Simple Patient / Family Education for ongoing care 1 15 []  - 0 Complex  (extensive) Patient / Family Education for ongoing care X- 1 10 Staff obtains Consents, Records, T Results / Process Orders est []  - 0 Staff telephones HHA, Nursing Homes / Clarify orders / etc []  - 0 Routine Transfer to another Facility (non-emergent condition) []  - 0 Routine Hospital Admission (non-emergent condition) []  - 0 New Admissions / Biomedical engineer / Ordering NPWT  Apligraf, etc. , []  - 0 Emergency Hospital Admission (emergent condition) X- 1 10 Simple Discharge Coordination []  - 0 Complex (extensive) Discharge Coordination PROCESS - Special Needs []  - 0 Pediatric / Minor Patient Management []  - 0 Isolation Patient Management []  - 0 Hearing / Language / Visual special needs []  - 0 Assessment of Community assistance (transportation, D/C planning, etc.) []  - 0 Additional assistance / Altered mentation []  - 0 Support Surface(s) Assessment (bed, cushion, seat, etc.) INTERVENTIONS - Wound Cleansing / Measurement X - Simple Wound Cleansing - one wound 1 5 []  - 0 Complex Wound Cleansing - multiple wounds X- 1 5 Wound Imaging (photographs - any number of wounds) []  - 0 Wound Tracing (instead of photographs) X- 1 5 Simple Wound Measurement - one wound []  - 0 Complex Wound Measurement - multiple wounds INTERVENTIONS - Wound Dressings X - Small Wound Dressing one or multiple wounds 1 10 []  - 0 Medium Wound Dressing one or multiple wounds []  - 0 Large Wound Dressing one or multiple wounds X- 1 5 Application of Medications - topical []  - 0 Application of Medications - injection INTERVENTIONS - Miscellaneous []  - 0 External ear exam []  - 0 Specimen Collection (cultures, biopsies, blood, body fluids, etc.) []  - 0 Specimen(s) / Culture(s) sent or taken to Lab for analysis []  - 0 Patient Transfer (multiple staff / Civil Service fast streamer / Similar devices) []  - 0 Simple Staple / Suture removal (25 or less) []  - 0 Complex Staple / Suture removal (26 or more) []   - 0 Hypo / Hyperglycemic Management (close monitor of Blood Glucose) []  - 0 Ankle / Brachial Index (ABI) - do not check if billed separately X- 1 5 Vital Signs Has the patient been seen at the hospital within the last three years: Yes Total Score: 100 Level Of Care: New/Established - Level 3 Electronic Signature(s) Signed: 01/05/2020 5:28:47 PM By: Baruch Gouty RN, BSN Entered By: Baruch Gouty on 01/05/2020 13:15:57 -------------------------------------------------------------------------------- Encounter Discharge Information Details Patient Name: Date of Service: Martin [name]. 01/05/2020 12:30 PM Medical Record Number: [id number] Patient Account Number: [id number] Date of Birth/Sex: Treating RN: 1945/11/18 (74 y.o. Martin [name] Primary Care Kaylyn Garrow: Martin [name] Other Clinician: Referring Chrisotpher Rivero: Treating Keone Kamer/Extender: Martin [name] in Treatment: 2 Encounter Discharge Information Items Discharge Condition: Stable Ambulatory Status: Cane Discharge Destination: Home Transportation: Private Auto Accompanied By: self Schedule Follow-up Appointment: Yes Clinical Summary of Care: Patient Declined Electronic Signature(s) Signed: 01/05/2020 5:14:09 PM By: Carlene Coria RN Entered By: Carlene Coria on 01/05/2020 13:44:36 -------------------------------------------------------------------------------- Lower Extremity Assessment Details Patient Name: Date of Service: HUSTON, STONEHOCKER 01/05/2020 12:30 PM Medical Record Number: [id number] Patient Account Number: [id number] Date of Birth/Sex: Treating RN: [birth year] (74 y.o. Martin [name] Primary Care Balthazar Dooly: Martin [name] Other Clinician: Referring Melizza Kanode: Treating Shaquisha Wynn/Extender: Martin [name] in Treatment: 2 Edema Assessment Assessed: Martin [name]: No] [Right: No] Edema: [Left: Ye] [Right: s] Calf Left: Right: Point of Measurement: cm From Medial Instep 40 cm  cm Ankle Left: Right: Point of Measurement: cm From Medial Instep 27.5 cm cm Vascular Assessment Pulses: Dorsalis Pedis Palpable: [Left:Yes] Electronic Signature(s) Signed: 01/06/2020 5:37:15 PM By: [name] RN, BSN Entered By: [name] on 01/05/2020 12:58:11 -------------------------------------------------------------------------------- [place] Details Patient Name: Date of Service: Martin [name]. 01/05/2020 12:30 PM Medical Record Number: [id number] Patient Account Number: [id number] Date of Birth/Sex: Treating RN: 11-16-1945 (74 y.o. Martin [name] Primary Care Robertine Kipper: Martin [name] Other Clinician: Referring  Nicoli Nardozzi: Treating Marvena Tally/Extender: Martin [name] in Treatment: 2 Active Inactive Nutrition Nursing Diagnoses: Impaired glucose control: actual or potential Potential for alteratiion in Nutrition/Potential for imbalanced nutrition Goals: Patient/caregiver will maintain therapeutic glucose control Date Initiated: 12/22/2019 Target Resolution Date: 01/19/2020 Goal Status: Active Interventions: Assess HgA1c results as ordered upon admission and as needed Treatment Activities: Patient referred to Primary Care Physician for further nutritional evaluation : 12/22/2019 Notes: Wound/Skin Impairment Nursing Diagnoses: Impaired tissue integrity Knowledge deficit related to ulceration/compromised skin integrity Goals: Patient/caregiver will verbalize understanding of skin care regimen Date Initiated: 12/22/2019 Target Resolution Date: 01/19/2020 Goal Status: Active Ulcer/skin breakdown will have a volume reduction of 30% by week 4 Date Initiated: 12/22/2019 Target Resolution Date: 01/19/2020 Goal Status: Active Interventions: Assess patient/caregiver ability to obtain necessary supplies Assess patient/caregiver ability to perform ulcer/skin care regimen upon admission and as needed Assess ulceration(s)  every visit Provide education on ulcer and skin care Treatment Activities: Skin care regimen initiated : 12/22/2019 Topical wound management initiated : 12/22/2019 Notes: Electronic Signature(s) Signed: 01/05/2020 5:28:47 PM By: Baruch Gouty RN, BSN Entered By: Baruch Gouty on 01/05/2020 12:50:23 -------------------------------------------------------------------------------- Pain Assessment Details Patient Name: Date of Service: Martin [name]. 01/05/2020 12:30 PM Medical Record Number: [id number] Patient Account Number: [id number] Date of Birth/Sex: Treating RN: August 10, 1945 (74 y.o. Martin [name] Primary Care Jerry Clyne: Martin [name] Other Clinician: Referring Jasmin Winberry: Treating Jorja Empie/Extender: Martin [name] in Treatment: 2 Active Problems Location of Pain Severity and Description of Pain Patient Has Paino No Site Locations Pain Management and Medication Current Pain Management: Electronic Signature(s) Signed: 01/06/2020 5:37:15 PM By: [name] RN, BSN Entered By: [name] on 01/05/2020 12:48:50 -------------------------------------------------------------------------------- Patient/Caregiver Education Details Patient Name: Date of Service: Martin [name] 9/8/2021andnbsp12:30 PM Medical Record Number: [id number] Patient Account Number: [id number] Date of Birth/Gender: Treating RN: 10/25/1945 (74 y.o. Martin [name] Primary Care Physician: Martin [name] Other Clinician: Referring Physician: Treating Physician/Extender: Martin [name] in Treatment: 2 Education Assessment Education Provided To: Patient Education Topics Provided Tissue Oxygenation: Methods: Explain/Verbal Responses: Reinforcements needed, State content correctly Wound/Skin Impairment: Methods: Explain/Verbal Responses: Reinforcements needed, State content correctly Electronic Signature(s) Signed: 01/05/2020 5:28:47 PM By:  Baruch Gouty RN, BSN Entered By: Baruch Gouty on 01/05/2020 12:50:50 -------------------------------------------------------------------------------- Wound Assessment Details Patient Name: Date of Service: Martin [name]. 01/05/2020 12:30 PM Medical Record Number: [id number] Patient Account Number: [id number] Date of Birth/Sex: Treating RN: December 13, 1945 (74 y.o. Martin [name] Primary Care Ailana Cuadrado: Martin [name] Other Clinician: Referring Zyaira Vejar: Treating Desiree Daise/Extender: Martin [name] in Treatment: 2 Wound Status Wound Number: 5 Primary Diabetic Wound/Ulcer of the Lower Extremity Etiology: Wound Location: Left Metatarsal head fifth Wound Open Wounding Event: Gradually Appeared Status: Date Acquired: 10/31/2019 Comorbid Cataracts, Coronary Artery Disease, Hypertension, Type II Weeks Of Treatment: 2 History: Diabetes, Neuropathy Clustered Wound: No Photos Photo Uploaded By: Martin [name] on 01/06/2020 14:05:58 Wound Measurements Length: (cm) 1.2 Width: (cm) 1.4 Depth: (cm) 0.3 Area: (cm) 1.319 Volume: (cm) 0.396 % Reduction in Area: -9% % Reduction in Volume: 53.2% Epithelialization: Small (1-33%) Undermining: Yes Starting Position (o'clock): 12 Ending Position (o'clock): 3 Maximum Distance: (cm) 0.4 Wound Description Classification: Grade 2 Wound Margin: Well defined, not attached Exudate Amount: Medium Exudate Type: Serosanguineous Exudate Color: red, brown Foul Odor After Cleansing: No Slough/Fibrino Yes Wound Bed Granulation Amount: Medium (34-66%) Exposed Structure Granulation Quality: Pink, Pale Fascia Exposed: No Necrotic Amount: Medium (34-66%) Fat Layer (Subcutaneous Tissue) Exposed: Yes Necrotic Quality:  Adherent Slough Tendon Exposed: No Muscle Exposed: No Joint Exposed: No Bone Exposed: No Treatment Notes Wound #5 (Left Metatarsal head fifth) 1. Cleanse With Wound Cleanser 3. Primary Dressing  Applied Calcium Alginate Ag 4. Secondary Dressing Dry Gauze Roll Gauze 5. Secured With Tape Notes front off loading shoe to the left foot Electronic Signature(s) Signed: 01/06/2020 5:37:15 PM By: [name] RN, BSN Entered By: [name] on 01/05/2020 12:58:51 -------------------------------------------------------------------------------- Vitals Details Patient Name: Date of Service: Martin [name]. 01/05/2020 12:30 PM Medical Record Number: [id number] Patient Account Number: [id number] Date of Birth/Sex: Treating RN: 1945/10/03 (74 y.o. Martin [name] Primary Care Jillann Charette: Martin [name] Other Clinician: Referring Emmely Bittinger: Treating Trana Ressler/Extender: Martin [name] in Treatment: 2 Vital Signs Time Taken: 12:48 Temperature (F): 98.0 Height (in): 72 Pulse (bpm): 76 Weight (lbs): 270 Respiratory Rate (breaths/min): 18 Body Mass Index (BMI): 36.6 Blood Pressure (mmHg): 144/73 Capillary Blood Glucose (mg/dl): 135 Reference Range: 80 - 120 mg / dl Notes glucose per pt report Electronic Signature(s) Signed: 01/06/2020 5:37:15 PM By: [name] RN, BSN Entered By: [name] on 01/05/2020 12:48:43

## 2020-01-12 ENCOUNTER — Encounter (HOSPITAL_BASED_OUTPATIENT_CLINIC_OR_DEPARTMENT_OTHER): Payer: PPO | Admitting: Physician Assistant

## 2020-01-12 ENCOUNTER — Other Ambulatory Visit: Payer: Self-pay | Admitting: Orthopedic Surgery

## 2020-01-12 DIAGNOSIS — E11621 Type 2 diabetes mellitus with foot ulcer: Secondary | ICD-10-CM | POA: Diagnosis not present

## 2020-01-12 DIAGNOSIS — L97522 Non-pressure chronic ulcer of other part of left foot with fat layer exposed: Secondary | ICD-10-CM | POA: Diagnosis not present

## 2020-01-12 NOTE — Telephone Encounter (Signed)
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 Pt has since been treated at wound care center. Do you still want to refill. Pt does not have follow up in the office.
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
## 2020-01-13 ENCOUNTER — Telehealth: Payer: Self-pay

## 2020-01-13 NOTE — Telephone Encounter (Signed)
Called pt lm on vm per Dr. Sharol Given to advise that we refilled his ABX but that he would like for the pt to follow up in the office in the next three weeks. advised to please call back and make an appt for follow up and to call with any questions.

## 2020-01-14 ENCOUNTER — Telehealth: Payer: Self-pay | Admitting: Orthopedic Surgery

## 2020-01-14 NOTE — Telephone Encounter (Signed)
Called patient left message on voicemail to return call to schedule an appointment with Dr. Sharol Given for follow up on his foot

## 2020-01-16 ENCOUNTER — Other Ambulatory Visit: Payer: Self-pay

## 2020-01-16 ENCOUNTER — Ambulatory Visit
Admission: RE | Admit: 2020-01-16 | Discharge: 2020-01-16 | Disposition: A | Discharge status: Home / Self Care | Payer: PPO | Source: Ambulatory Visit | Attending: Physician Assistant | Admitting: Physician Assistant

## 2020-01-16 DIAGNOSIS — E11621 Type 2 diabetes mellitus with foot ulcer: Secondary | ICD-10-CM | POA: Diagnosis not present

## 2020-01-16 DIAGNOSIS — E13621 Other specified diabetes mellitus with foot ulcer: Secondary | ICD-10-CM

## 2020-01-16 MED ORDER — GADOBENATE DIMEGLUMINE 529 MG/ML IV SOLN
20.0000 mL | Freq: Once | INTRAVENOUS | Status: AC | PRN
  Administered 2020-01-16: 20 mL via INTRAVENOUS

## 2020-01-17 NOTE — Progress Notes (Signed)
KHYLON, DAVIES ([id number]) Visit Report for 01/12/2020 Arrival Information Details Patient Name: Date of Service: CORINTHIAN, MIZRAHI 01/12/2020 1:30 PM Medical Record Number: [id number] Patient Account Number: [id number] Date of Birth/Sex: Treating RN: [birth year] (74 y.o. [name], [name] Primary Care Jadarius Commons: [name] Other Clinician: Referring Mir Fullilove: Treating Aris Moman/Extender: Sandria Bales in Treatment: 3 Visit Information History Since Last Visit Added or deleted any medications: No Patient Arrived: Ambulatory Any new allergies or adverse reactions: No Arrival Time: 13:48 Had a fall or experienced change in No Accompanied By: wife activities of daily living that may affect Transfer Assistance: None risk of falls: Patient Identification Verified: Yes Signs or symptoms of abuse/neglect since last visito No Secondary Verification Process Completed: Yes Hospitalized since last visit: No Patient Requires Transmission-Based Precautions: No Implantable device outside of the clinic excluding No Patient Has Alerts: No cellular tissue based products placed in the center since last visit: Has Dressing in Place as Prescribed: Yes Pain Present Now: No Electronic Signature(s) Signed: 01/13/2020 11:12:08 AM By: Sandre Kitty Entered By: Sandre Kitty on 01/12/2020 13:48:20 -------------------------------------------------------------------------------- Clinic Level of Care Assessment Details Patient Name: Date of Service: NICKALOS, PETERSEN 01/12/2020 1:30 PM Medical Record Number: [id number] Patient Account Number: [id number] Date of Birth/Sex: Treating RN: Aug 17, 1945 (74 y.o. [name] Primary Care Xin Klawitter: [name] Other Clinician: Referring Sumayah Bearse: Treating Nayana Lenig/Extender: Sandria Bales in Treatment: 3 Clinic Level of Care Assessment Items TOOL 4 Quantity Score []  - 0 Use when only an EandM is  performed on FOLLOW-UP visit ASSESSMENTS - Nursing Assessment / Reassessment X- 1 10 Reassessment of Co-morbidities (includes updates in patient status) X- 1 5 Reassessment of Adherence to Treatment Plan ASSESSMENTS - Wound and Skin A ssessment / Reassessment X - Simple Wound Assessment / Reassessment - one wound 1 5 []  - 0 Complex Wound Assessment / Reassessment - multiple wounds []  - 0 Dermatologic / Skin Assessment (not related to wound area) ASSESSMENTS - Focused Assessment X- 1 5 Circumferential Edema Measurements - multi extremities []  - 0 Nutritional Assessment / Counseling / Intervention X- 1 5 Lower Extremity Assessment (monofilament, tuning fork, pulses) []  - 0 Peripheral Arterial Disease Assessment (using hand held doppler) ASSESSMENTS - Ostomy and/or Continence Assessment and Care []  - 0 Incontinence Assessment and Management []  - 0 Ostomy Care Assessment and Management (repouching, etc.) PROCESS - Coordination of Care X - Simple Patient / Family Education for ongoing care 1 15 []  - 0 Complex (extensive) Patient / Family Education for ongoing care X- 1 10 Staff obtains Programmer, systems, Records, T Results / Process Orders est []  - 0 Staff telephones HHA, Nursing Homes / Clarify orders / etc []  - 0 Routine Transfer to another Facility (non-emergent condition) []  - 0 Routine Hospital Admission (non-emergent condition) []  - 0 New Admissions / Biomedical engineer / Ordering NPWT Apligraf, etc. , []  - 0 Emergency Hospital Admission (emergent condition) X- 1 10 Simple Discharge Coordination []  - 0 Complex (extensive) Discharge Coordination PROCESS - Special Needs []  - 0 Pediatric / Minor Patient Management []  - 0 Isolation Patient Management []  - 0 Hearing / Language / Visual special needs []  - 0 Assessment of Community assistance (transportation, D/C planning, etc.) []  - 0 Additional assistance / Altered mentation []  - 0 Support Surface(s) Assessment  (bed, cushion, seat, etc.) INTERVENTIONS - Wound Cleansing / Measurement X - Simple Wound Cleansing - one wound 1 5 []  - 0 Complex Wound Cleansing - multiple wounds X- 1  5 Wound Imaging (photographs - any number of wounds) []  - 0 Wound Tracing (instead of photographs) X- 1 5 Simple Wound Measurement - one wound []  - 0 Complex Wound Measurement - multiple wounds INTERVENTIONS - Wound Dressings X - Small Wound Dressing one or multiple wounds 1 10 []  - 0 Medium Wound Dressing one or multiple wounds []  - 0 Large Wound Dressing one or multiple wounds X- 1 5 Application of Medications - topical []  - 0 Application of Medications - injection INTERVENTIONS - Miscellaneous []  - 0 External ear exam []  - 0 Specimen Collection (cultures, biopsies, blood, body fluids, etc.) []  - 0 Specimen(s) / Culture(s) sent or taken to Lab for analysis []  - 0 Patient Transfer (multiple staff / Civil Service fast streamer / Similar devices) []  - 0 Simple Staple / Suture removal (25 or less) []  - 0 Complex Staple / Suture removal (26 or more) []  - 0 Hypo / Hyperglycemic Management (close monitor of Blood Glucose) []  - 0 Ankle / Brachial Index (ABI) - do not check if billed separately X- 1 5 Vital Signs Has the patient been seen at the hospital within the last three years: Yes Total Score: 100 Level Of Care: New/Established - Level 3 Electronic Signature(s) Signed: 01/12/2020 6:07:28 PM By: Baruch Gouty RN, BSN Entered By: Baruch Gouty on 01/12/2020 14:26:23 -------------------------------------------------------------------------------- Encounter Discharge Information Details Patient Name: Date of Service: [name] 01/12/2020 1:30 PM Medical Record Number: [id number] Patient Account Number: [id number] Date of Birth/Sex: Treating RN: March 12, 1946 (74 y.o. [name] Primary Care Jakarie Pember: [name] Other Clinician: Referring Marchell Froman: Treating Pj Zehner/Extender: Sandria Bales in Treatment: 3 Encounter Discharge Information Items Discharge Condition: Stable Ambulatory Status: Cane Discharge Destination: Home Transportation: Private Auto Accompanied By: self Schedule Follow-up Appointment: Yes Clinical Summary of Care: Patient Declined Electronic Signature(s) Signed: 01/17/2020 1:15:48 PM By: Carlene Coria RN Entered By: Carlene Coria on 01/12/2020 14:53:56 -------------------------------------------------------------------------------- Lower Extremity Assessment Details Patient Name: Date of Service: AYANSH, FEUTZ 01/12/2020 1:30 PM Medical Record Number: [id number] Patient Account Number: [id number] Date of Birth/Sex: Treating RN: 1945/07/02 (74 y.o. [name] Primary Care Deloise Marchant: [name] Other Clinician: Referring Riya Huxford: Treating Madgeline Rayo/Extender: Sandria Bales in Treatment: 3 Edema Assessment Assessed: [Left: Yes] [Right: No] Edema: [Left: Ye] [Right: s] Calf Left: Right: Point of Measurement: cm From Medial Instep 39 cm cm Ankle Left: Right: Point of Measurement: cm From Medial Instep 26 cm cm Vascular Assessment Pulses: Dorsalis Pedis Palpable: [Left:Yes] Electronic Signature(s) Signed: 01/12/2020 5:18:19 PM By: Deon Pilling Signed: 01/12/2020 6:07:28 PM By: Baruch Gouty RN, BSN Entered By: Deon Pilling on 01/12/2020 13:55:44 -------------------------------------------------------------------------------- [place] Details Patient Name: Date of Service: [name] 01/12/2020 1:30 PM Medical Record Number: [id number] Patient Account Number: [id number] Date of Birth/Sex: Treating RN: [birth year] (74 y.o. [name] Primary Care Jamyrah Saur: [name] Other Clinician: Referring Makalyn Lennox: Treating Rheana Casebolt/Extender: Sandria Bales in Treatment: 3 Active Inactive Nutrition Nursing Diagnoses: Impaired glucose  control: actual or potential Potential for alteratiion in Nutrition/Potential for imbalanced nutrition Goals: Patient/caregiver will maintain therapeutic glucose control Date Initiated: 12/22/2019 Target Resolution Date: 01/19/2020 Goal Status: Active Interventions: Assess HgA1c results as ordered upon admission and as needed Treatment Activities: Patient referred to Primary Care Physician for further nutritional evaluation : 12/22/2019 Notes: Wound/Skin Impairment Nursing Diagnoses: Impaired tissue integrity Knowledge deficit related to ulceration/compromised skin integrity Goals: Patient/caregiver will verbalize understanding of skin care regimen Date Initiated: 12/22/2019 Target  Resolution Date: 01/19/2020 Goal Status: Active Ulcer/skin breakdown will have a volume reduction of 30% by week 4 Date Initiated: 12/22/2019 Target Resolution Date: 01/19/2020 Goal Status: Active Interventions: Assess patient/caregiver ability to obtain necessary supplies Assess patient/caregiver ability to perform ulcer/skin care regimen upon admission and as needed Assess ulceration(s) every visit Provide education on ulcer and skin care Treatment Activities: Skin care regimen initiated : 12/22/2019 Topical wound management initiated : 12/22/2019 Notes: Electronic Signature(s) Signed: 01/12/2020 6:07:28 PM By: Baruch Gouty RN, BSN Entered By: Baruch Gouty on 01/12/2020 13:54:15 -------------------------------------------------------------------------------- Pain Assessment Details Patient Name: Date of Service: [name] 01/12/2020 1:30 PM Medical Record Number: [id number] Patient Account Number: [id number] Date of Birth/Sex: Treating RN: Mar 22, 1946 (74 y.o. [name] Primary Care Yordan Martindale: [name] Other Clinician: Referring Eoin Willden: Treating Dymond Gutt/Extender: Sandria Bales in Treatment: 3 Active Problems Location of Pain Severity and  Description of Pain Patient Has Paino No Site Locations Pain Management and Medication Current Pain Management: Electronic Signature(s) Signed: 01/12/2020 6:07:28 PM By: Baruch Gouty RN, BSN Signed: 01/13/2020 11:12:08 AM By: Sandre Kitty Entered By: Sandre Kitty on 01/12/2020 13:48:55 -------------------------------------------------------------------------------- Patient/Caregiver Education Details Patient Name: Date of Service: REIGN, BARTNICK 9/15/2021andnbsp1:30 PM Medical Record Number: [id number] Patient Account Number: [id number] Date of Birth/Gender: Treating RN: 08-02-1945 (74 y.o. [name] Primary Care Physician: [name] Other Clinician: Referring Physician: Treating Physician/Extender: Sandria Bales in Treatment: 3 Education Assessment Education Provided To: Patient Education Topics Provided Infection: Methods: Explain/Verbal Responses: Reinforcements needed, State content correctly Offloading: Methods: Explain/Verbal Responses: Reinforcements needed, State content correctly Wound/Skin Impairment: Methods: Explain/Verbal Responses: Reinforcements needed, State content correctly Electronic Signature(s) Signed: 01/12/2020 6:07:28 PM By: Baruch Gouty RN, BSN Entered By: Baruch Gouty on 01/12/2020 13:54:44 -------------------------------------------------------------------------------- Wound Assessment Details Patient Name: Date of Service: [name] 01/12/2020 1:30 PM Medical Record Number: [id number] Patient Account Number: [id number] Date of Birth/Sex: Treating RN: 11/15/1945 (74 y.o. [name] Primary Care Iva Posten: [name] Other Clinician: Referring Rut Betterton: Treating Yuette Putnam/Extender: Sandria Bales in Treatment: 3 Wound Status Wound Number: 5 Primary Diabetic Wound/Ulcer of the Lower Extremity Etiology: Wound Location: Left Metatarsal head  fifth Wound Open Wounding Event: Gradually Appeared Status: Date Acquired: 10/31/2019 Comorbid Cataracts, Coronary Artery Disease, Hypertension, Type II Weeks Of Treatment: 3 History: Diabetes, Neuropathy Clustered Wound: No Photos Photo Uploaded By: [name] on 01/13/2020 14:18:42 Wound Measurements Length: (cm) 1.1 Width: (cm) 1.3 Depth: (cm) 0.4 Area: (cm) 1.123 Volume: (cm) 0.449 % Reduction in Area: 7.2% % Reduction in Volume: 47% Epithelialization: Small (1-33%) Tunneling: No Undermining: Yes Starting Position (o'clock): 3 Ending Position (o'clock): 6 Maximum Distance: (cm) 1 Wound Description Classification: Grade 2 Wound Margin: Well defined, not attached Exudate Amount: Medium Exudate Type: Serosanguineous Exudate Color: red, brown Foul Odor After Cleansing: No Slough/Fibrino Yes Wound Bed Granulation Amount: Large (67-100%) Exposed Structure Granulation Quality: Pink, Pale Fascia Exposed: No Necrotic Amount: Small (1-33%) Fat Layer (Subcutaneous Tissue) Exposed: Yes Necrotic Quality: Adherent Slough Tendon Exposed: No Muscle Exposed: No Joint Exposed: No Bone Exposed: No Treatment Notes Wound #5 (Left Metatarsal head fifth) 1. Cleanse With Wound Cleanser 2. Periwound Care Moisturizing lotion 3. Primary Dressing Applied Calcium Alginate Ag 4. Secondary Dressing Dry Gauze Roll Gauze 5. Secured With Tape Notes front off loading shoe to the left foot Electronic Signature(s) Signed: 01/12/2020 5:18:19 PM By: Deon Pilling Signed: 01/12/2020 6:07:28 PM By: Baruch Gouty RN, BSN Entered By: Deon Pilling on 01/12/2020 13:57:52 --------------------------------------------------------------------------------  Vitals Details Patient Name: Date of Service: JUDE, NACLERIO 01/12/2020 1:30 PM Medical Record Number: [id number] Patient Account Number: [id number] Date of Birth/Sex: Treating RN: 30-Dec-1945 (74 y.o. [name] Primary Care  Jahyra Sukup: [name] Other Clinician: Referring Londen Bok: Treating Aradhya Shellenbarger/Extender: Sandria Bales in Treatment: 3 Vital Signs Time Taken: 13:48 Temperature (F): 98.3 Height (in): 72 Pulse (bpm): 85 Weight (lbs): 270 Respiratory Rate (breaths/min): 18 Body Mass Index (BMI): 36.6 Blood Pressure (mmHg): 145/79 Capillary Blood Glucose (mg/dl): 144 Reference Range: 80 - 120 mg / dl Electronic Signature(s) Signed: 01/13/2020 11:12:08 AM By: Sandre Kitty Entered By: Sandre Kitty on 01/12/2020 13:48:50

## 2020-01-18 ENCOUNTER — Encounter (HOSPITAL_BASED_OUTPATIENT_CLINIC_OR_DEPARTMENT_OTHER): Payer: PPO | Admitting: Internal Medicine

## 2020-01-19 ENCOUNTER — Telehealth: Payer: Self-pay | Admitting: Family Medicine

## 2020-01-19 ENCOUNTER — Other Ambulatory Visit: Payer: Self-pay

## 2020-01-19 ENCOUNTER — Encounter (HOSPITAL_BASED_OUTPATIENT_CLINIC_OR_DEPARTMENT_OTHER): Payer: PPO | Admitting: Physician Assistant

## 2020-01-19 DIAGNOSIS — L97522 Non-pressure chronic ulcer of other part of left foot with fat layer exposed: Secondary | ICD-10-CM | POA: Diagnosis not present

## 2020-01-19 DIAGNOSIS — E11621 Type 2 diabetes mellitus with foot ulcer: Secondary | ICD-10-CM | POA: Diagnosis not present

## 2020-01-19 DIAGNOSIS — I10 Essential (primary) hypertension: Secondary | ICD-10-CM | POA: Diagnosis not present
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
## 2020-01-19 NOTE — Progress Notes (Signed)
  Chronic Care Management   Outreach Note  01/19/2020 Name: Martin Mchugh [facility] MRN: [id number] DOB: [birth year]  Referred by: Marin Olp, MD Reason for referral : No chief complaint on file.   An unsuccessful telephone outreach was attempted today. The patient was referred to the pharmacist for assistance with care management and care coordination.   Follow Up Plan:   Lauretta Grill Upstream Scheduler

## 2020-01-21 NOTE — Progress Notes (Signed)
LUM, STILLINGER ([id number]) Visit Report for 01/19/2020 Arrival Information Details Patient Name: Date of Service: Martin [name], Martin [name] 01/19/2020 10:30 A M Medical Record Number: [id number] Patient Account Number: [id number] Date of Birth/Sex: Treating RN: 06-Mar-1946 (74 y.o. Martin [name], Martin [name] Primary Care Martin [name]: [name] Other Clinician: Referring Martin [name]: Treating Martin [name]/Extender: Martin [name] in Treatment: 4 Visit Information History Since Last Visit Added or deleted any medications: No Patient Arrived: Martin [name] Any new allergies or adverse reactions: No Arrival Time: 10:57 Had a fall or experienced change in No Accompanied By: wife activities of daily living that may affect Transfer Assistance: None risk of falls: Patient Identification Verified: Yes Signs or symptoms of abuse/neglect since last visito No Secondary Verification Process Completed: Yes Hospitalized since last visit: No Patient Requires Transmission-Based Precautions: No Implantable device outside of the clinic excluding No Patient Has Alerts: No cellular tissue based products placed in the center since last visit: Has Dressing in Place as Prescribed: Yes Pain Present Now: No Electronic Signature(s) Signed: 01/20/2020 8:15:52 AM By: Sandre Kitty Entered By: Sandre Kitty on 01/19/2020 10:57:50 -------------------------------------------------------------------------------- Clinic Level of Care Assessment Details Patient Name: Date of Service: Martin [name] 01/19/2020 10:30 A M Medical Record Number: [id number] Patient Account Number: [id number] Date of Birth/Sex: Treating RN: 09/23/1945 (74 y.o. Martin [name] Primary Care Martin [name]: [name] Other Clinician: Referring Martin [name]: Treating Martin [name]/Extender: Martin [name] in Treatment: 4 Clinic Level of Care Assessment Items TOOL 4 Quantity Score []  - 0 Use when only an EandM is  performed on FOLLOW-UP visit ASSESSMENTS - Nursing Assessment / Reassessment X- 1 10 Reassessment of Co-morbidities (includes updates in patient status) X- 1 5 Reassessment of Adherence to Treatment Plan ASSESSMENTS - Wound and Skin A ssessment / Reassessment X - Simple Wound Assessment / Reassessment - one wound 1 5 []  - 0 Complex Wound Assessment / Reassessment - multiple wounds []  - 0 Dermatologic / Skin Assessment (not related to wound area) ASSESSMENTS - Focused Assessment []  - 0 Circumferential Edema Measurements - multi extremities []  - 0 Nutritional Assessment / Counseling / Intervention X- 1 5 Lower Extremity Assessment (monofilament, tuning fork, pulses) []  - 0 Peripheral Arterial Disease Assessment (using hand held doppler) ASSESSMENTS - Ostomy and/or Continence Assessment and Care []  - 0 Incontinence Assessment and Management []  - 0 Ostomy Care Assessment and Management (repouching, etc.) PROCESS - Coordination of Care X - Simple Patient / Family Education for ongoing care 1 15 []  - 0 Complex (extensive) Patient / Family Education for ongoing care X- 1 10 Staff obtains Programmer, systems, Records, T Results / Process Orders est []  - 0 Staff telephones HHA, Nursing Homes / Clarify orders / etc []  - 0 Routine Transfer to another Facility (non-emergent condition) []  - 0 Routine Hospital Admission (non-emergent condition) []  - 0 New Admissions / Biomedical engineer / Ordering NPWT Apligraf, etc. , []  - 0 Emergency Hospital Admission (emergent condition) X- 1 10 Simple Discharge Coordination []  - 0 Complex (extensive) Discharge Coordination PROCESS - Special Needs []  - 0 Pediatric / Minor Patient Management []  - 0 Isolation Patient Management []  - 0 Hearing / Language / Visual special needs []  - 0 Assessment of Community assistance (transportation, D/C planning, etc.) []  - 0 Additional assistance / Altered mentation []  - 0 Support Surface(s) Assessment  (bed, cushion, seat, etc.) INTERVENTIONS - Wound Cleansing / Measurement X - Simple Wound Cleansing - one wound 1 5 []  - 0 Complex Wound Cleansing - multiple wounds  X- 1 5 Wound Imaging (photographs - any number of wounds) []  - 0 Wound Tracing (instead of photographs) X- 1 5 Simple Wound Measurement - one wound []  - 0 Complex Wound Measurement - multiple wounds INTERVENTIONS - Wound Dressings X - Small Wound Dressing one or multiple wounds 1 10 []  - 0 Medium Wound Dressing one or multiple wounds []  - 0 Large Wound Dressing one or multiple wounds X- 1 5 Application of Medications - topical []  - 0 Application of Medications - injection INTERVENTIONS - Miscellaneous []  - 0 External ear exam []  - 0 Specimen Collection (cultures, biopsies, blood, body fluids, etc.) []  - 0 Specimen(s) / Culture(s) sent or taken to Lab for analysis []  - 0 Patient Transfer (multiple staff / Martin [name] / Similar devices) []  - 0 Simple Staple / Suture removal (25 or less) []  - 0 Complex Staple / Suture removal (26 or more) []  - 0 Hypo / Hyperglycemic Management (close monitor of Blood Glucose) []  - 0 Ankle / Brachial Index (ABI) - do not check if billed separately X- 1 5 Vital Signs Has the patient been seen at the hospital within the last three years: Yes Total Score: 95 Level Of Care: New/Established - Level 3 Electronic Signature(s) Signed: 01/19/2020 5:11:27 PM By: Baruch Gouty RN, BSN Entered By: Baruch Gouty on 01/19/2020 11:45:47 -------------------------------------------------------------------------------- Encounter Discharge Information Details Patient Name: Date of Service: Martin Agee. 01/19/2020 10:30 A M Medical Record Number: [id number] Patient Account Number: [id number] Date of Birth/Sex: Treating RN: Sep 02, 1945 (74 y.o. Martin [name] Primary Care Lee-Ann Gal: [name] Other Clinician: Referring Yousif Edelson: Treating Anael Rosch/Extender: Martin [name] in Treatment: 4 Encounter Discharge Information Items Discharge Condition: Stable Ambulatory Status: Cane Discharge Destination: Home Transportation: Private Auto Accompanied By: wife Schedule Follow-up Appointment: Yes Clinical Summary of Care: Patient Declined Electronic Signature(s) Signed: 01/21/2020 4:51:09 PM By: Carlene Coria RN Entered By: Carlene Coria on 01/19/2020 12:10:15 -------------------------------------------------------------------------------- Lower Extremity Assessment Details Patient Name: Date of Service: Martin [name], Martin [name] 01/19/2020 10:30 A M Medical Record Number: [id number] Patient Account Number: [id number] Date of Birth/Sex: Treating RN: May 30, 1945 (74 y.o. [name] Primary Care Enijah Furr: [name] Other Clinician: Referring Jahid Weida: Treating Carolee Channell/Extender: Martin [name] in Treatment: 4 Edema Assessment Assessed: [name]: No] [name]: No] Edema: [Left: Ye] [Right: s] Calf Left: Right: Point of Measurement: cm From Medial Instep 39 cm cm Ankle Left: Right: Point of Measurement: cm From Medial Instep 26 cm cm Vascular Assessment Pulses: Dorsalis Pedis Palpable: [Left:Yes] Electronic Signature(s) Signed: 01/19/2020 4:54:19 PM By: [name] RN, BSN Entered By: [name] on 01/19/2020 11:04:15 -------------------------------------------------------------------------------- [place] Details Patient Name: Date of Service: Martin Agee. 01/19/2020 10:30 A M Medical Record Number: [id number] Patient Account Number: [id number] Date of Birth/Sex: Treating RN: Dec 25, 1945 (74 y.o. Martin [name] Primary Care Patina Spanier: [name] Other Clinician: Referring Marion Seese: Treating Shown Dissinger/Extender: Martin [name] in Treatment: 4 Active Inactive Nutrition Nursing Diagnoses: Impaired glucose control: actual or potential Potential for  alteratiion in Nutrition/Potential for imbalanced nutrition Goals: Patient/caregiver will maintain therapeutic glucose control Date Initiated: 12/22/2019 Target Resolution Date: 02/16/2020 Goal Status: Active Interventions: Assess HgA1c results as ordered upon admission and as needed Treatment Activities: Patient referred to Primary Care Physician for further nutritional evaluation : 12/22/2019 Notes: Wound/Skin Impairment Nursing Diagnoses: Impaired tissue integrity Knowledge deficit related to ulceration/compromised skin integrity Goals: Patient/caregiver will verbalize understanding of skin care regimen Date Initiated: 12/22/2019 Target Resolution Date:  02/16/2020 Goal Status: Active Ulcer/skin breakdown will have a volume reduction of 30% by week 4 Date Initiated: 12/22/2019 Date Inactivated: 01/19/2020 Target Resolution Date: 01/19/2020 Goal Status: Unmet Unmet Reason: infection Ulcer/skin breakdown will have a volume reduction of 50% by week 8 Date Initiated: 01/19/2020 Target Resolution Date: 02/16/2020 Goal Status: Active Interventions: Assess patient/caregiver ability to obtain necessary supplies Assess patient/caregiver ability to perform ulcer/skin care regimen upon admission and as needed Assess ulceration(s) every visit Provide education on ulcer and skin care Treatment Activities: Skin care regimen initiated : 12/22/2019 Topical wound management initiated : 12/22/2019 Notes: Electronic Signature(s) Signed: 01/19/2020 5:11:27 PM By: Baruch Gouty RN, BSN Entered By: Baruch Gouty on 01/19/2020 11:25:49 -------------------------------------------------------------------------------- Pain Assessment Details Patient Name: Date of Service: Martin Agee. 01/19/2020 10:30 A M Medical Record Number: [id number] Patient Account Number: [id number] Date of Birth/Sex: Treating RN: 1945/08/05 (74 y.o. Martin [name] Primary Care Kechia Yahnke: [name] Other  Clinician: Referring Dalonte Hardage: Treating Novis League/Extender: Martin [name] in Treatment: 4 Active Problems Location of Pain Severity and Description of Pain Patient Has Paino No Site Locations Pain Management and Medication Current Pain Management: Electronic Signature(s) Signed: 01/19/2020 5:11:27 PM By: Baruch Gouty RN, BSN Signed: 01/20/2020 8:15:52 AM By: Sandre Kitty Entered By: Sandre Kitty on 01/19/2020 10:58:41 -------------------------------------------------------------------------------- Patient/Caregiver Education Details Patient Name: Date of Service: Martin Agee 9/22/2021andnbsp10:30 [place] Record Number: [id number] Patient Account Number: [id number] Date of Birth/Gender: Treating RN: 25-Jun-1945 (74 y.o. Martin [name] Primary Care Physician: [name] Other Clinician: Referring Physician: Treating Physician/Extender: Martin [name] in Treatment: 4 Education Assessment Education Provided To: Patient Education Topics Provided Infection: Methods: Explain/Verbal Responses: Reinforcements needed, State content correctly Offloading: Methods: Explain/Verbal Responses: Reinforcements needed, State content correctly Wound/Skin Impairment: Methods: Explain/Verbal Responses: Reinforcements needed, State content correctly Electronic Signature(s) Signed: 01/19/2020 5:11:27 PM By: Baruch Gouty RN, BSN Entered By: Baruch Gouty on 01/19/2020 11:26:34 -------------------------------------------------------------------------------- Wound Assessment Details Patient Name: Date of Service: Martin Agee. 01/19/2020 10:30 A M Medical Record Number: [id number] Patient Account Number: [id number] Date of Birth/Sex: Treating RN: 05-09-1945 (74 y.o. Martin [name] Primary Care Jeromie Gainor: [name] Other Clinician: Referring Leibish Mcgregor: Treating Shavonte Zhao/Extender: Martin [name] in Treatment: 4 Wound Status Wound Number: 5 Primary Diabetic Wound/Ulcer of the Lower Extremity Etiology: Wound Location: Left Metatarsal head fifth Wound Open Wounding Event: Gradually Appeared Status: Date Acquired: 10/31/2019 Comorbid Cataracts, Coronary Artery Disease, Hypertension, Type II Weeks Of Treatment: 4 History: Diabetes, Neuropathy Clustered Wound: No Photos Photo Uploaded By: [name] on 01/20/2020 11:36:48 Wound Measurements Length: (cm) 1 Width: (cm) 1.3 Depth: (cm) 0.4 Area: (cm) 1.021 Volume: (cm) 0.408 % Reduction in Area: 15.6% % Reduction in Volume: 51.8% Epithelialization: Small (1-33%) Tunneling: No Undermining: Yes Starting Position (o'clock): 10 Ending Position (o'clock): 6 Maximum Distance: (cm) 0.9 Wound Description Classification: Grade 3 Wagner Verification: MRI Wound Margin: Well defined, not attached Exudate Amount: Medium Exudate Type: Serosanguineous Exudate Color: red, brown Foul Odor After Cleansing: No Slough/Fibrino Yes Wound Bed Granulation Amount: Large (67-100%) Exposed Structure Granulation Quality: Pink Fascia Exposed: No Necrotic Amount: Small (1-33%) Fat Layer (Subcutaneous Tissue) Exposed: Yes Necrotic Quality: Adherent Slough Tendon Exposed: No Muscle Exposed: No Joint Exposed: No Bone Exposed: No Treatment Notes Wound #5 (Left Metatarsal head fifth) 1. Cleanse With Wound Cleanser 3. Primary Dressing Applied Calcium Alginate Ag 4. Secondary Dressing Dry Gauze Roll Gauze 5. Secured With Tape Notes front off loading shoe to the left  foot Electronic Signature(s) Signed: 01/19/2020 5:11:27 PM By: Baruch Gouty RN, BSN Entered By: Baruch Gouty on 01/19/2020 11:30:25 -------------------------------------------------------------------------------- Vitals Details Patient Name: Date of Service: Martin Agee. 01/19/2020 10:30 A M Medical Record Number: [id number] Patient Account  Number: [id number] Date of Birth/Sex: Treating RN: [birth year] (74 y.o. Martin [name] Primary Care Joelene Barriere: [name] Other Clinician: Referring Delois Silvester: Treating Aleister Lady/Extender: Martin [name] in Treatment: 4 Vital Signs Time Taken: 10:57 Temperature (F): 98.8 Height (in): 72 Pulse (bpm): 64 Weight (lbs): 270 Respiratory Rate (breaths/min): 18 Body Mass Index (BMI): 36.6 Blood Pressure (mmHg): 170/80 Capillary Blood Glucose (mg/dl): 129 Reference Range: 80 - 120 mg / dl Electronic Signature(s) Signed: 01/20/2020 8:15:52 AM By: Sandre Kitty Entered By: Sandre Kitty on 01/19/2020 10:58:30

## 2020-01-25 DIAGNOSIS — D3131 Benign neoplasm of right choroid: Secondary | ICD-10-CM | POA: Diagnosis not present

## 2020-01-25 DIAGNOSIS — H40023 Open angle with borderline findings, high risk, bilateral: Secondary | ICD-10-CM | POA: Diagnosis not present

## 2020-01-25 DIAGNOSIS — H43813 Vitreous degeneration, bilateral: Secondary | ICD-10-CM | POA: Diagnosis not present

## 2020-01-25 DIAGNOSIS — E113213 Type 2 diabetes mellitus with mild nonproliferative diabetic retinopathy with macular edema, bilateral: Secondary | ICD-10-CM | POA: Diagnosis not present

## 2020-01-26 ENCOUNTER — Other Ambulatory Visit: Payer: Self-pay

## 2020-01-26 ENCOUNTER — Encounter (HOSPITAL_BASED_OUTPATIENT_CLINIC_OR_DEPARTMENT_OTHER): Payer: PPO | Admitting: Physician Assistant

## 2020-01-26 DIAGNOSIS — L97522 Non-pressure chronic ulcer of other part of left foot with fat layer exposed: Secondary | ICD-10-CM | POA: Diagnosis not present

## 2020-01-26 DIAGNOSIS — E11621 Type 2 diabetes mellitus with foot ulcer: Secondary | ICD-10-CM | POA: Diagnosis not present

## 2020-01-26 DIAGNOSIS — I10 Essential (primary) hypertension: Secondary | ICD-10-CM | POA: Diagnosis not present

## 2020-01-26 DIAGNOSIS — E1142 Type 2 diabetes mellitus with diabetic polyneuropathy: Secondary | ICD-10-CM | POA: Diagnosis not present

## 2020-01-26 NOTE — Progress Notes (Signed)
RUVIM, RISKO ([id number]) Visit Report for 01/26/2020 Arrival Information Details Patient Name: Date of Service: Martin, [name] 01/26/2020 12:30 PM Medical Record Number: [id number] Patient Account Number: [id number] Date of Birth/Sex: Treating RN: [birth year] (74 y.o. Martin [name] Primary Care Evonda Enge: [name] Other Clinician: Referring Ozias Dicenzo: Treating Selestino Nila/Extender: Sandria Bales in Treatment: 5 Visit Information History Since Last Visit Added or deleted any medications: No Patient Arrived: Ambulatory Any new allergies or adverse reactions: No Arrival Time: 12:59 Had a fall or experienced change in No Accompanied By: self activities of daily living that may affect Transfer Assistance: None risk of falls: Patient Identification Verified: Yes Signs or symptoms of abuse/neglect since No Secondary Verification Process Completed: Yes last visito Patient Requires Transmission-Based Precautions: No Hospitalized since last visit: No Patient Has Alerts: No Implantable device outside of the clinic No excluding cellular tissue based products placed in the center since last visit: Has Dressing in Place as Prescribed: Yes Has Footwear/Offloading in Place as Yes Prescribed: Left: Surgical Shoe with Pressure Relief Insole Pain Present Now: No Electronic Signature(s) Signed: 01/26/2020 5:07:30 PM By: Kela Millin Entered By: Kela Millin on 01/26/2020 13:00:33 -------------------------------------------------------------------------------- Encounter Discharge Information Details Patient Name: Date of Service: Martin [name]. 01/26/2020 12:30 PM Medical Record Number: [id number] Patient Account Number: [id number] Date of Birth/Sex: Treating RN: [birth year] (74 y.o. Martin [name] Primary Care Anothony Bursch: [name] Other Clinician: Referring Tyeisha Dinan: Treating Jamaria Amborn/Extender: Sandria Bales  in Treatment: 5 Encounter Discharge Information Items Discharge Condition: Stable Ambulatory Status: Ambulatory Discharge Destination: Home Transportation: Private Auto Accompanied By: wife Schedule Follow-up Appointment: Yes Clinical Summary of Care: Patient Declined Electronic Signature(s) Signed: 01/26/2020 5:07:30 PM By: Kela Millin Entered By: Kela Millin on 01/26/2020 13:47:55 -------------------------------------------------------------------------------- Lower Extremity Assessment Details Patient Name: Date of Service: Martin, [name] 01/26/2020 12:30 PM Medical Record Number: [id number] Patient Account Number: [id number] Date of Birth/Sex: Treating RN: 19-Dec-1945 (74 y.o. Martin [name] Primary Care Damita Eppard: [name] Other Clinician: Referring Callia Swim: Treating Emerlyn Mehlhoff/Extender: Sandria Bales in Treatment: 5 Edema Assessment Assessed: [Left: No] [Right: No] Edema: [Left: Ye] [Right: s] Calf Left: Right: Point of Measurement: From Medial Instep 41 cm Ankle Left: Right: Point of Measurement: From Medial Instep 28.5 cm Vascular Assessment Pulses: Dorsalis Pedis Palpable: [Left:Yes] Electronic Signature(s) Signed: 01/26/2020 5:07:30 PM By: Kela Millin Entered By: Kela Millin on 01/26/2020 13:02:48 -------------------------------------------------------------------------------- [place] Details Patient Name: Date of Service: Martin [name]. 01/26/2020 12:30 PM Medical Record Number: [id number] Patient Account Number: [id number] Date of Birth/Sex: Treating RN: 10-05-1945 (74 y.o. Martin [name] Primary Care Maverik Foot: [name] Other Clinician: Referring Earlean Fidalgo: Treating Yazmeen Woolf/Extender: Sandria Bales in Treatment: 5 Active Inactive Nutrition Nursing Diagnoses: Impaired glucose control: actual or potential Potential for alteratiion in  Nutrition/Potential for imbalanced nutrition Goals: Patient/caregiver will maintain therapeutic glucose control Date Initiated: 12/22/2019 Target Resolution Date: 02/16/2020 Goal Status: Active Interventions: Assess HgA1c results as ordered upon admission and as needed Treatment Activities: Patient referred to Primary Care Physician for further nutritional evaluation : 12/22/2019 Notes: Wound/Skin Impairment Nursing Diagnoses: Impaired tissue integrity Knowledge deficit related to ulceration/compromised skin integrity Goals: Patient/caregiver will verbalize understanding of skin care regimen Date Initiated: 12/22/2019 Target Resolution Date: 02/16/2020 Goal Status: Active Ulcer/skin breakdown will have a volume reduction of 30% by week 4 Date Initiated: 12/22/2019 Date Inactivated: 01/19/2020 Target Resolution Date: 01/19/2020 Goal Status: Unmet Unmet Reason: infection Ulcer/skin breakdown will have a volume  reduction of 50% by week 8 Date Initiated: 01/19/2020 Target Resolution Date: 02/16/2020 Goal Status: Active Interventions: Assess patient/caregiver ability to obtain necessary supplies Assess patient/caregiver ability to perform ulcer/skin care regimen upon admission and as needed Assess ulceration(s) every visit Provide education on ulcer and skin care Treatment Activities: Skin care regimen initiated : 12/22/2019 Topical wound management initiated : 12/22/2019 Notes: Electronic Signature(s) Signed: 01/26/2020 5:36:27 PM By: Baruch Gouty RN, BSN Entered By: Baruch Gouty on 01/26/2020 13:28:18 -------------------------------------------------------------------------------- Pain Assessment Details Patient Name: Date of Service: Martin [name]. 01/26/2020 12:30 PM Medical Record Number: [id number] Patient Account Number: [id number] Date of Birth/Sex: Treating RN: [birth year] (74 y.o. Martin [name] Primary Care Miguelangel Korn: [name] Other  Clinician: Referring Haley Roza: Treating Eaden Hettinger/Extender: Sandria Bales in Treatment: 5 Active Problems Location of Pain Severity and Description of Pain Patient Has Paino No Site Locations Pain Management and Medication Current Pain Management: Electronic Signature(s) Signed: 01/26/2020 5:07:30 PM By: Kela Millin Entered By: Kela Millin on 01/26/2020 13:01:48 -------------------------------------------------------------------------------- Patient/Caregiver Education Details Patient Name: Date of Service: Martin [name] 9/29/2021andnbsp12:30 PM Medical Record Number: [id number] Patient Account Number: [id number] Date of Birth/Gender: Treating RN: 17-Aug-1945 (74 y.o. Martin [name] Primary Care Physician: [name] Other Clinician: Referring Physician: Treating Physician/Extender: Sandria Bales in Treatment: 5 Education Assessment Education Provided To: Patient Education Topics Provided Hyperbaric Oxygenation: Methods: Explain/Verbal Responses: Reinforcements needed, State content correctly Offloading: Methods: Explain/Verbal Responses: Reinforcements needed, State content correctly Wound/Skin Impairment: Methods: Explain/Verbal Responses: Reinforcements needed, State content correctly Electronic Signature(s) Signed: 01/26/2020 5:36:27 PM By: Baruch Gouty RN, BSN Entered By: Baruch Gouty on 01/26/2020 13:28:50 -------------------------------------------------------------------------------- Wound Assessment Details Patient Name: Date of Service: Martin [name]. 01/26/2020 12:30 PM Medical Record Number: [id number] Patient Account Number: [id number] Date of Birth/Sex: Treating RN: 10-12-1945 (74 y.o. Martin [name] Primary Care Ryler Laskowski: [name] Other Clinician: Referring [place] Duquette: Treating Garrit Marrow/Extender: Sandria Bales in Treatment: 5 Wound  Status Wound Number: 5 Primary Diabetic Wound/Ulcer of the Lower Extremity Etiology: Wound Location: Left Metatarsal head fifth Wound Open Wounding Event: Gradually Appeared Status: Date Acquired: 10/31/2019 Comorbid Cataracts, Coronary Artery Disease, Hypertension, Type II Weeks Of Treatment: 5 History: Diabetes, Neuropathy Clustered Wound: No Wound Measurements Length: (cm) 1 Width: (cm) 1.4 Depth: (cm) 0.6 Area: (cm) 1.1 Volume: (cm) 0.66 % Reduction in Area: 9.1% % Reduction in Volume: 22.1% Epithelialization: Small (1-33%) Tunneling: No Undermining: Yes Starting Position (o'clock): 12 Ending Position (o'clock): 12 Maximum Distance: (cm) 1.5 Wound Description Classification: Grade 3 Wound Margin: Well defined, not attached Exudate Amount: Medium Exudate Type: Serosanguineous Exudate Color: red, brown Foul Odor After Cleansing: No Slough/Fibrino Yes Wound Bed Granulation Amount: Large (67-100%) Exposed Structure Granulation Quality: Pink Fascia Exposed: No Necrotic Amount: Small (1-33%) Fat Layer (Subcutaneous Tissue) Exposed: Yes Necrotic Quality: Adherent Slough Tendon Exposed: No Muscle Exposed: No Joint Exposed: No Bone Exposed: No Treatment Notes Wound #5 (Left Metatarsal head fifth) 1. Cleanse With Wound Cleanser 3. Primary Dressing Applied Calcium Alginate Ag 4. Secondary Dressing Dry Gauze Foam 5. Secured With Tape 7. Footwear/Offloading device applied T Contact Cast otal Notes foam donut. TCC applied by PA Electronic Signature(s) Signed: 01/26/2020 5:07:30 PM By: Kela Millin Entered By: Kela Millin on 01/26/2020 13:05:05 -------------------------------------------------------------------------------- Vitals Details Patient Name: Date of Service: Martin [name]. 01/26/2020 12:30 PM Medical Record Number: [id number] Patient Account Number: [id number] Date of Birth/Sex: Treating RN: [birth year] (74 y.o. Martin [name] Primary Care Croix Presley: [name]  Other Clinician: Referring Tonimarie Gritz: Treating Tyjae Shvartsman/Extender: Sandria Bales in Treatment: 5 Vital Signs Time Taken: 13:00 Temperature (F): 98.3 Height (in): 72 Pulse (bpm): 83 Weight (lbs): 270 Respiratory Rate (breaths/min): 19 Body Mass Index (BMI): 36.6 Blood Pressure (mmHg): 148/76 Capillary Blood Glucose (mg/dl): 143 Reference Range: 80 - 120 mg / dl Notes patient stated CBG this morning was 143 Electronic Signature(s) Signed: 01/26/2020 5:07:30 PM By: Kela Millin Entered By: Kela Millin on 01/26/2020 13:01:43
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
## 2020-01-28 ENCOUNTER — Encounter (HOSPITAL_BASED_OUTPATIENT_CLINIC_OR_DEPARTMENT_OTHER): Payer: PPO | Attending: Internal Medicine | Admitting: Internal Medicine

## 2020-01-28 DIAGNOSIS — E11621 Type 2 diabetes mellitus with foot ulcer: Secondary | ICD-10-CM | POA: Diagnosis not present

## 2020-01-28 DIAGNOSIS — L97522 Non-pressure chronic ulcer of other part of left foot with fat layer exposed: Secondary | ICD-10-CM | POA: Diagnosis not present

## 2020-01-28 DIAGNOSIS — M86272 Subacute osteomyelitis, left ankle and foot: Secondary | ICD-10-CM | POA: Diagnosis not present

## 2020-01-28 DIAGNOSIS — L97529 Non-pressure chronic ulcer of other part of left foot with unspecified severity: Secondary | ICD-10-CM | POA: Diagnosis present

## 2020-01-28 NOTE — Progress Notes (Signed)
HERNAN, TURNAGE ([id number]) Visit Report for 01/28/2020 Arrival Information Details Patient Name: Date of Service: Martin [name], Martin [name] 01/28/2020 10:00 A M Medical Record Number: [id number] Patient Account Number: [id number] Date of Birth/Sex: Treating RN: Sep 22, 1945 (74 y.o. [name]) Carlene Coria Primary Care [place] Cl[place]: [name] Other Clinician: Referring Moriyah Byington: Treating Dorma Altman/Extender: [name] in Treatment: 5 Visit Information History Since Last Visit All ordered tests and consults were completed: No Patient Arrived: Ambulatory Added or deleted any medications: No Arrival Time: 10:06 Any new allergies or adverse reactions: No Accompanied By: wife Had a fall or experienced change in No Transfer Assistance: None activities of daily living that may affect Patient Identification Verified: Yes risk of falls: Secondary Verification Process Completed: Yes Signs or symptoms of abuse/neglect since last visito No Patient Requires Transmission-Based Precautions: No Hospitalized since last visit: No Patient Has Alerts: No Implantable device outside of the clinic excluding No cellular tissue based products placed in the center since last visit: Has Dressing in Place as Prescribed: Yes Has Footwear/Offloading in Place as Prescribed: Yes Left: T Contact Cast otal Pain Present Now: No Electronic Signature(s) Signed: 01/28/2020 4:20:16 PM By: Carlene Coria RN Entered By: Carlene Coria on 01/28/2020 10:07:22 -------------------------------------------------------------------------------- Lower Extremity Assessment Details Patient Name: Date of Service: Martin [name], Martin [name] 01/28/2020 10:00 A M Medical Record Number: [id number] Patient Account Number: [id number] Date of Birth/Sex: Treating RN: [birth year] (74 y.o. [name] Primary Care Norina Cowper: [name] Other Clinician: Referring Bridgette Wolden: Treating Emiliano Welshans/Extender: [name] in Treatment: 5 Edema Assessment Assessed: [name]: No] [name]: No] Edema: [Left: Ye] [Right: s] Calf Left: Right: Point of Measurement: From Medial Instep 41 cm Ankle Left: Right: Point of Measurement: From Medial Instep 28.5 cm Vascular Assessment Pulses: Dorsalis Pedis Palpable: [Left:Yes] Electronic Signature(s) Signed: 01/28/2020 4:10:39 PM By: Kela Millin Entered By: Kela Millin on 01/28/2020 10:42:35 -------------------------------------------------------------------------------- Multi-Disciplinary Care Plan Details Patient Name: Date of Service: Martin [name]. 01/28/2020 10:00 A M Medical Record Number: [id number] Patient Account Number: [id number] Date of Birth/Sex: Treating RN: 29-Jun-1945 (74 y.o. [name] Primary Care Wilferd Ritson: [name] Other Clinician: Referring Rene Sizelove: Treating Khari Lett/Extender: [name] in Treatment: 5 Active Inactive Nutrition Nursing Diagnoses: Impaired glucose control: actual or potential Potential for alteratiion in Nutrition/Potential for imbalanced nutrition Goals: Patient/caregiver will maintain therapeutic glucose control Date Initiated: 12/22/2019 Target Resolution Date: 02/16/2020 Goal Status: Active Interventions: Assess HgA1c results as ordered upon admission and as needed Treatment Activities: Patient referred to Primary Care Physician for further nutritional evaluation : 12/22/2019 Notes: Wound/Skin Impairment Nursing Diagnoses: Impaired tissue integrity Knowledge deficit related to ulceration/compromised skin integrity Goals: Patient/caregiver will verbalize understanding of skin care regimen Date Initiated: 12/22/2019 Target Resolution Date: 02/16/2020 Goal Status: Active Ulcer/skin breakdown will have a volume reduction of 30% by week 4 Date Initiated: 12/22/2019 Date Inactivated: 01/19/2020 Target Resolution Date: 01/19/2020 Goal Status:  Unmet Unmet Reason: infection Ulcer/skin breakdown will have a volume reduction of 50% by week 8 Date Initiated: 01/19/2020 Target Resolution Date: 02/16/2020 Goal Status: Active Interventions: Assess patient/caregiver ability to obtain necessary supplies Assess patient/caregiver ability to perform ulcer/skin care regimen upon admission and as needed Assess ulceration(s) every visit Provide education on ulcer and skin care Treatment Activities: Skin care regimen initiated : 12/22/2019 Topical wound management initiated : 12/22/2019 Notes: Electronic Signature(s) Signed: 01/28/2020 4:10:39 PM By: Kela Millin Entered By: Kela Millin on 01/28/2020 10:43:10 -------------------------------------------------------------------------------- Pain Assessment Details Patient Name: Date of Service: Martin [name]. 01/28/2020 10:00  A M Medical Record Number: [id number] Patient Account Number: [id number] Date of Birth/Sex: Treating RN: 01/19/1946 (74 y.o. [name]) Carlene Coria Primary Care [place] Welliver: [name] Other Clinician: Referring Raea Magallon: Treating Kymiah Araiza/Extender: [name] in Treatment: 5 Active Problems Location of Pain Severity and Description of Pain Patient Has Paino No Site Locations Pain Management and Medication Current Pain Management: Electronic Signature(s) Signed: 01/28/2020 4:20:16 PM By: Carlene Coria RN Entered By: Carlene Coria on 01/28/2020 10:08:21 -------------------------------------------------------------------------------- Patient/Caregiver Education Details Patient Name: Date of Service: Martin [name], [name] 10/1/2021andnbsp10:00 [place] Record Number: [id number] Patient Account Number: [id number] Date of Birth/Gender: Treating RN: [birth year] (74 y.o. [name] Primary Care Physician: [name] Other Clinician: Referring Physician: Treating Physician/Extender: [name]  in Treatment: 5 Education Assessment Education Provided To: Patient Education Topics Provided Wound/Skin Impairment: Handouts: Caring for Your Ulcer Methods: Explain/Verbal Responses: State content correctly Electronic Signature(s) Signed: 01/28/2020 4:10:39 PM By: Kela Millin Entered By: Kela Millin on 01/28/2020 10:43:25 -------------------------------------------------------------------------------- Wound Assessment Details Patient Name: Date of Service: Martin [name]. 01/28/2020 10:00 A M Medical Record Number: [id number] Patient Account Number: [id number] Date of Birth/Sex: Treating RN: [birth year] (74 y.o. [name] Primary Care Elza Sortor: [name] Other Clinician: Referring Niara Bunker: Treating Ikey Omary/Extender: [name] in Treatment: 5 Wound Status Wound Number: 5 Primary Diabetic Wound/Ulcer of the Lower Extremity Etiology: Wound Location: Left Metatarsal head fifth Wound Open Wounding Event: Gradually Appeared Status: Date Acquired: 10/31/2019 Comorbid Cataracts, Coronary Artery Disease, Hypertension, Type II Weeks Of Treatment: 5 History: Diabetes, Neuropathy Clustered Wound: No Wound Measurements Length: (cm) 1 Width: (cm) 1.4 Depth: (cm) 0.6 Area: (cm) 1.1 Volume: (cm) 0.66 % Reduction in Area: 9.1% % Reduction in Volume: 22.1% Epithelialization: Small (1-33%) Tunneling: No Undermining: No Wound Description Classification: Grade 3 Wound Margin: Well defined, not attached Exudate Amount: Medium Exudate Type: Serosanguineous Exudate Color: red, brown Foul Odor After Cleansing: No Slough/Fibrino Yes Wound Bed Granulation Amount: Large (67-100%) Exposed Structure Granulation Quality: Pink Fascia Exposed: No Necrotic Amount: Small (1-33%) Fat Layer (Subcutaneous Tissue) Exposed: Yes Necrotic Quality: Adherent Slough Tendon Exposed: No Muscle Exposed: No Joint Exposed: No Bone Exposed:  No Electronic Signature(s) Signed: 01/28/2020 4:10:39 PM By: Kela Millin Entered By: Kela Millin on 01/28/2020 10:42:54 -------------------------------------------------------------------------------- Vitals Details Patient Name: Date of Service: Martin [name]. 01/28/2020 10:00 A M Medical Record Number: [id number] Patient Account Number: [id number] Date of Birth/Sex: Treating RN: 1945/07/11 (74 y.o. [name]) Carlene Coria Primary Care Chelcie Estorga: [name] Other Clinician: Referring Kenan Moodie: Treating Kaliah Haddaway/Extender: [name] in Treatment: 5 Vital Signs Time Taken: 10:07 Temperature (F): 97.9 Height (in): 72 Pulse (bpm): 70 Weight (lbs): 270 Respiratory Rate (breaths/min): 18 Body Mass Index (BMI): 36.6 Blood Pressure (mmHg): 170/82 Reference Range: 80 - 120 mg / dl Electronic Signature(s) Signed: 01/28/2020 4:20:16 PM By: Carlene Coria RN Entered By: Carlene Coria on 01/28/2020 10:08:14

## 2020-01-28 NOTE — Progress Notes (Signed)
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 brachial index of 0.61 on both sides. Waveforms were monophasic on the left posterior tibial and dorsalis pedis was biphasic. Overall impression was minimally reduced resting left a couple brachial index some suggestion of tibial disease and mild digital arterial disease. On the right mildly reduced right brachial index of 1.05 mildly reduced first toe pressure probable component of mild digital arterial disease The patient is been using silver collagen. He is tolerating the doxycycline albeit taking with food. No diarrhea 01/02/17; wound on the tip of his great toe. Using silver collagen. He is tolerating doxycycline which I have renewed today for 2 weeks with one refill. [Empiric treatment of possible osteomyelitis] 01/09/17; continues on doxycycline starting on week 4. Using  silver collagen 01/16/17; using silver collagen to the wound tip. I want to make sure that he has 6 weeks total of doxycycline. We did not specifically culture a organism on bone culture. The area on the tip of his toe is closed over 01/23/17; using silver collagen with improvement. Completing 6 weeks of  doxycycline for underlying early osteomyelitis 02/06/17; patient arrives today having completed his 6 weeks of doxycycline empirically for underlying osteomyelitis Readmission. 12/22/2019 upon evaluation today patient appears to be doing somewhat poorly in regard to his left foot in the fifth metatarsal head location. He actually states that this occurred as a result of him [place] which he knows he should not been doing. Nonetheless he tells me that this happened July 4 of 2021. Subsequently and March his A1c was 7.6 which is fairly good and Dr. Sharol Given did place him on doxycycline for the next month which was actually initiated yesterday. With that being said Dr. Jess Barters opinion also was that the patient required a ray amputation in order to take care of what was described as osteomyelitis based on x-ray results. Again I do not have that x-ray for direct review but nonetheless the patient states that Dr. Sharol Given was preparing to try to get him into surgery for amputation. Nonetheless the patient really was not comfortable with proceeding directly with the amputation and subsequently wanted to come here to see if we can do anything to try to help him heal this area. Fortunately there is no signs of active infection systemically at this time which is good news. I do see some evidence of infection locally however and I do believe the doxycycline could be of benefit. The patient would like to attempt what ever he can to try to prevent amputation if at all possible. Unfortunately his ABI today was 0.77 when checked here in the clinic I do believe this requires further and more direct evaluation by vascular prior to proceeding with any aggressive sharp debridement. 12/29/2019 upon evaluation today patient appears to be doing decently well in regard to his foot ulcer at this point. Fortunately there is no signs of systemic infection the doxycycline unfortunately is not can work for him  however as it is resistant as far as the MRSA is concerned identified on the culture. He has taken Cipro before therefore I think Levaquin would be a good option for him. Overall I see no signs of worsening of the infection at this point. He has his arterial study later today and he has his MRI next week. 01/05/2020 on evaluation today patient appears to be doing excellent in regard to his wounds currently. Fortunately there is no signs of active infection which is excellent news. He does seem to be making some progress and I am pleased I do believe the antibiotic has been beneficial. His MRI is actually scheduled for the 19th. 01/12/2020 upon evaluation today patient appears to be doing well at this point in regard to his plantar foot ulcer. Fortunately there is no signs of active infection at this time which is great news I am very pleased in that regard. He still on the clindamycin at this time which is excellent and again he seems to be doing quite well. Overall I am extremely pleased with how things are progressing. He has his MRI scheduled for this coming Sunday. Depending on the results of the MRI might need to extend the clindamycin 01/19/2020 upon evaluation today patient appears to be doing  pretty well in regard to his wound at this point. There does not appear to be any signs of worsening in general which is great news. There is no signs of active infection either which is also good news. Overall I am extremely pleased with where he stands. No fevers, chills, nausea, vomiting, or diarrhea. With that being said we did get the results of his MRI back and unfortunately it does show signs of bone marrow changes consistent with osteomyelitis fortunately however this means that things are mild there is no bony destruction and no septic arthritis noted at this point. 01/26/2020 on evaluation today patient appears to be doing well with regard to his foot ulcer I do not see anything that appears to be  worse but unfortunately he is also not making the improvement that I would like to see from the standpoint of undermining. I do believe he could benefit from a total contact cast. At this point he is not ready to go down the road of hyperbarics he is still considering that. 01/28/2020; patient in today for total contact cast change i.e. the obligatory first total contact cast change. He has a wound on the left fifth met head. I did not review this today Objective Constitutional Vitals Time Taken: 10:07 AM, Height: 72 in, Weight: 270 lbs, BMI: 36.6, Temperature: 97.9 F, Pulse: 70 bpm, Respiratory Rate: 18 breaths/min, Blood Pressure: 170/82 mmHg. Integumentary (Hair, Skin) Wound #5 status is Open. Original cause of wound was Gradually Appeared. The wound is located on the Left Metatarsal head fifth. The wound measures 1cm length x 1.4cm width x 0.6cm depth; 1.1cm^2 area and 0.66cm^3 volume. There is Fat Layer (Subcutaneous Tissue) exposed. There is no tunneling or undermining noted. There is a medium amount of serosanguineous drainage noted. The wound margin is well defined and not attached to the wound base. There is large (67-100%) pink granulation within the wound bed. There is a small (1-33%) amount of necrotic tissue within the wound bed including Adherent Slough. Assessment Active Problems ICD-10 Type 2 diabetes mellitus with foot ulcer Non-pressure chronic ulcer of other part of left foot with fat layer exposed Essential (primary) hypertension Atherosclerotic heart disease of native coronary artery without angina pectoris Procedures Wound #5 Pre-procedure diagnosis of Wound #5 is a Diabetic Wound/Ulcer of the Lower Extremity located on the Left Metatarsal head fifth . There was a T Contact otal Cast Procedure by Ricard Dillon., MD. Post procedure Diagnosis Wound #5: Same as Pre-Procedure Plan #1 total contact cast change in the standard fashion and will be followed up on this  next week. There were no issues detected Electronic Signature(s) Signed: 01/28/2020 4:24:19 PM By: Linton Ham MD Entered By: Linton Ham on 01/28/2020 11:31:08 -------------------------------------------------------------------------------- Total Contact Cast Details Patient Name: Date of Service: Martin [name], Martin [name] 01/28/2020 10:00 A M Medical Record Number: [id number] Patient Account Number: [id number] Date of Birth/Sex: Treating RN: [birth year] (74 y.o. Martin [name] Primary Care Provider: Garret [name] Other Clinician: Referring Provider: Treating Provider/Extender: Martin [name] in Treatment: 5 T Contact Cast Applied for Wound Assessment: otal Wound #5 Left Metatarsal head fifth Performed By: Physician Ricard Dillon., MD Post Procedure Diagnosis Same as Pre-procedure Electronic Signature(s) Signed: 01/28/2020 4:10:39 PM By: Kela Millin Signed: 01/28/2020 4:24:19 PM By: Linton Ham MD Entered By: Kela Millin on 01/28/2020 10:43:42 -------------------------------------------------------------------------------- SuperBill Details Patient Name: Date of Service: Martin [name], Martin [name] 01/28/2020 Medical Record Number: [id number] Patient Account Number: [id number] Date of Birth/Sex: Treating RN: [birth year] (  74 y.o. Martin [name] Primary Care Provider: Garret [name] Other Clinician: Referring Provider: Treating Provider/Extender: Martin [name] in Treatment: 5 Diagnosis Coding ICD-10 Codes Code Description [phone] Type 2 diabetes mellitus with foot ulcer L97.522 Non-pressure chronic ulcer of other part of left foot with fat layer exposed I10 Essential (primary) hypertension I25.10 Atherosclerotic heart disease of native coronary artery without angina pectoris Facility Procedures CPT4 Code: 43154008 Description: [id number] - APPLY TOTAL CONTACT LEG CAST ICD-10 Diagnosis Description L97.522  Non-pressure chronic ulcer of other part of left foot with fat layer exposed Modifier: Quantity: 1 Physician Procedures : CPT4 Code Description Modifier [id number] - WC PHYS APPLY TOTAL CONTACT CAST ICD-10 Diagnosis Description L97.522 Non-pressure chronic ulcer of other part of left foot with fat layer exposed Quantity: 1 Electronic Signature(s) Signed: 01/28/2020 4:24:19 PM By: Linton Ham MD Entered By: Linton Ham on 01/28/2020 11:31:19

## 2020-01-31 ENCOUNTER — Other Ambulatory Visit: Payer: Self-pay | Admitting: Thoracic Surgery (Cardiothoracic Vascular Surgery)

## 2020-01-31 DIAGNOSIS — I723 Aneurysm of iliac artery: Secondary | ICD-10-CM

## 2020-02-01 ENCOUNTER — Ambulatory Visit
Admission: RE | Admit: 2020-02-01 | Discharge: 2020-02-01 | Disposition: A | Discharge status: Home / Self Care | Payer: PPO | Source: Ambulatory Visit | Attending: Thoracic Surgery (Cardiothoracic Vascular Surgery) | Admitting: Thoracic Surgery (Cardiothoracic Vascular Surgery)

## 2020-02-01 ENCOUNTER — Other Ambulatory Visit: Payer: Self-pay

## 2020-02-01 ENCOUNTER — Encounter: Payer: Self-pay | Admitting: Thoracic Surgery (Cardiothoracic Vascular Surgery)

## 2020-02-01 ENCOUNTER — Ambulatory Visit (INDEPENDENT_AMBULATORY_CARE_PROVIDER_SITE_OTHER): Payer: PPO | Admitting: Thoracic Surgery (Cardiothoracic Vascular Surgery)

## 2020-02-01 VITALS — BP 149/81 | HR 71 | Temp 97.5°F | Resp 20 | Ht 73.0 in | Wt 270.0 lb

## 2020-02-01 DIAGNOSIS — I723 Aneurysm of iliac artery: Secondary | ICD-10-CM | POA: Diagnosis not present

## 2020-02-01 DIAGNOSIS — Z951 Presence of aortocoronary bypass graft: Secondary | ICD-10-CM

## 2020-02-01 NOTE — Progress Notes (Signed)
[address]       [place] [address]             [phone]     HPI: Martin [name] returns for follow-up regarding his sternal dehiscence  Martin [name] is a 74 year old man who has a history of hypertension, hyperlipidemia, type 2 diabetes, diabetic neuropathy and retinopathy, obesity, CAD, and STEMI.  He underwent coronary bypass grafting x4 on 10/01/2018 after presenting with an ST elevation MI.  Postoperative course was complicated by weakness, orthostatic hypotension, acute kidney injury, depression, deconditioning, and atrial fibrillation.  He was in the hospital about 2-1/2 weeks and then went to a SNF for about a month before going home.  Somewhere in the postoperative period he developed a sternal dehiscence.  I saw him in the office in April.  He was doing well at that time.  He was only having a little bit of sternal discomfort when he coughed.  He feels well.  He does have some discomfort when he is washing the top of his Lucianne Lei, but otherwise is not having any pain related to his surgery.  He does still feel the sensation of the bone moving.  It really is not bothering him at this point.  He is currently dealing with a wound issue on his left foot related to diabetic neuropathy.  Past Medical History:  Diagnosis Date   A-fib ([facility])    Diabetes mellitus    Heart attack ([facility]) 10/01/2018   Pt had open heart surgery   Hypertension    Tobacco abuse     Current Outpatient Medications  Medication Sig Dispense Refill   aspirin 81 MG EC tablet Take 1 tablet (81 mg total) by mouth daily.     doxycycline (VIBRA-TABS) 100 MG tablet TAKE 1 TABLET BY MOUTH TWICE A DAY 60 tablet 0   ezetimibe (ZETIA) 10 MG tablet TAKE 1 TABLET BY MOUTH EVERY DAY 90 tablet 3   fexofenadine (ALLEGRA) 180 MG tablet Take 180 mg by mouth daily.     fluticasone (FLONASE) 50 MCG/ACT nasal spray SPRAY 2 SPRAYS INTO EACH NOSTRIL EVERY DAY (Patient taking differently: Place 1-2 sprays  into both nostrils daily as needed for allergies. ) 48 mL 1   insulin aspart (NOVOLOG FLEXPEN) 100 UNIT/ML FlexPen 3 times a day (just before each meal) 25-20-25 units, and pen needles 4/day 25 pen 3   insulin glargine (LANTUS SOLOSTAR) 100 UNIT/ML Solostar Pen Inject 10 Units into the skin at bedtime. 5 pen PRN   Insulin Pen Needle (NOVOFINE) 32G X 6 MM MISC USE 5 TIMES DAILY 450 each 2   losartan (COZAAR) 25 MG tablet Take 0.5 tablets (12.5 mg total) by mouth daily. 90 tablet 0   metoprolol tartrate (LOPRESSOR) 50 MG tablet Take 1 tablet (50 mg total) by mouth 2 (two) times daily. 180 tablet 3   ONETOUCH VERIO test strip CHECK LEVELS 4 TIMES DAILY 150 strip 1   Pitavastatin Calcium 1 MG TABS Take by mouth daily.     torsemide (DEMADEX) 20 MG tablet TAKE 2 TABLETS BY MOUTH EVERY DAY (Patient taking differently: Take 20 mg by mouth daily. ) 180 tablet 3   vitamin C (ASCORBIC ACID) 250 MG tablet Take 250 mg by mouth daily.     No current facility-administered medications for this visit.    Physical Exam BP (!) 149/81    Pulse 71    Temp (!) 97.5 F (36.4 C)    Resp  20    Ht 6\' 1"  (1.854 m)    Wt 270 lb (122.5 kg)    SpO2 94% Comment: RA with mask on   BMI 35.76 kg/m  74 year old man in no acute distress Alert and oriented x3 with no focal deficits Lungs clear with equal breath sounds bilaterally Cardiac regular rate and rhythm normal S1-S2 Sternum unstable, wound well-healed, nontender Left foot in boot  Diagnostic Tests: CHEST - 2 VIEW  COMPARISON:  August 03, 2019  FINDINGS: Lungs are clear. Heart is upper normal in size with pulmonary vascularity normal. Patient is status post coronary artery bypass grafting. No adenopathy. No appreciable bone lesions.  IMPRESSION: Status post coronary artery bypass grafting. Heart upper normal in size. Lungs clear.   Electronically Signed   By: Lowella Grip III M.D.   On: 02/01/2020 09:51 I personally reviewed the chest  x-ray images and concur with the findings noted above  Impression: Martin [name] is a 74 year old man who presented a little over a year ago with an ST elevation MI.  He had severe CAD and underwent coronary bypass grafting in June 2020.  He had a complicated postoperative course with orthostatic hypotension, generalized weakness, some delirium and depression.  Somewhere during his postoperative course he had a sternal dehiscence.  He looks great.  Overall he is doing extremely well with the exception of this foot issue which is being managed by Dr. Sharol Given.  Sternal dehiscence-minimal discomfort.  It is more something that he is aware of rather than pain.  He is able to manage that by limiting certain activities.  We again discussed the possibility of sternal plating to reconstruct the sternum.  He is not interested in doing that at this time.  Plan: Follow-up as needed  I spent 10 minutes in review of images, records, and in discussion with Martin [name] today  Melrose Nakayama, MD [facility] [phone]

## 2020-02-02 ENCOUNTER — Encounter (HOSPITAL_BASED_OUTPATIENT_CLINIC_OR_DEPARTMENT_OTHER): Payer: PPO | Admitting: Physician Assistant

## 2020-02-02 DIAGNOSIS — E11621 Type 2 diabetes mellitus with foot ulcer: Secondary | ICD-10-CM | POA: Diagnosis not present

## 2020-02-02 DIAGNOSIS — L97522 Non-pressure chronic ulcer of other part of left foot with fat layer exposed: Secondary | ICD-10-CM | POA: Diagnosis not present
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
## 2020-02-03 NOTE — Progress Notes (Signed)
Martin [name], Martin [name] ([id number]) Visit Report for 02/02/2020 Arrival Information Details Patient Name: Date of Service: Martin [name], Martin [name] 02/02/2020 11:00 A M Medical Record Number: [id number] Patient Account Number: [id number] Date of Birth/Sex: Treating RN: 1945/05/14 (74 y.o. Martin [name], Martin [name] Primary Care Martin [name]: Martin [name] Other Clinician: Referring Martin [name]: Treating Martin [name]/Extender: Martin [name] in Treatment: 6 Visit Information History Since Last Visit Added or deleted any medications: No Patient Arrived: Martin [name] Any new allergies or adverse reactions: No Arrival Time: 11:27 Had a fall or experienced change in No Accompanied By: wife activities of daily living that may affect Transfer Assistance: None risk of falls: Patient Identification Verified: Yes Signs or symptoms of abuse/neglect since last visito No Secondary Verification Process Completed: Yes Hospitalized since last visit: No Patient Requires Transmission-Based Precautions: No Implantable device outside of the clinic excluding No Patient Has Alerts: No cellular tissue based products placed in the center since last visit: Has Dressing in Place as Prescribed: Yes Pain Present Now: No Electronic Signature(s) Signed: 02/03/2020 1:11:06 PM By: Martin [name] Entered By: Martin [name] on 02/02/2020 11:28:34 -------------------------------------------------------------------------------- Lower Extremity Assessment Details Patient Name: Date of Service: Martin [name], Martin [name] 02/02/2020 11:00 A M Medical Record Number: [id number] Patient Account Number: [id number] Date of Birth/Sex: Treating RN: 1945-05-15 (74 y.o. Martin [name] Primary Care Martin [name]: Martin [name] Other Clinician: Referring Martin [name]: Treating Lucca Ballo/Extender: Martin [name] in Treatment: 6 Edema Assessment Assessed: Martin [name]: Yes] [Right: No] Edema: [Left: Ye] [Right: s] Calf Left:  Right: Point of Measurement: From Medial Instep 39 cm Ankle Left: Right: Point of Measurement: From Medial Instep 27 cm Vascular Assessment Pulses: Dorsalis Pedis Palpable: [Left:Yes] Electronic Signature(s) Signed: 02/02/2020 5:54:43 PM By: Martin [name] Entered By: Martin [name] on 02/02/2020 11:49:29 -------------------------------------------------------------------------------- [place] Details Patient Name: Date of Service: Martin [name]. 02/02/2020 11:00 A M Medical Record Number: [id number] Patient Account Number: [id number] Date of Birth/Sex: Treating RN: June 06, 1945 (74 y.o. Martin [name] Primary Care Martin [name]: Martin [name] Other Clinician: Referring Martin [name]: Treating Martin [name]/Extender: Martin [name] in Treatment: 6 Active Inactive Nutrition Nursing Diagnoses: Impaired glucose control: actual or potential Potential for alteratiion in Nutrition/Potential for imbalanced nutrition Goals: Patient/caregiver will maintain therapeutic glucose control Date Initiated: 12/22/2019 Target Resolution Date: 02/16/2020 Goal Status: Active Interventions: Assess HgA1c results as ordered upon admission and as needed Treatment Activities: Patient referred to Primary Care Physician for further nutritional evaluation : 12/22/2019 Notes: Wound/Skin Impairment Nursing Diagnoses: Impaired tissue integrity Knowledge deficit related to ulceration/compromised skin integrity Goals: Patient/caregiver will verbalize understanding of skin care regimen Date Initiated: 12/22/2019 Target Resolution Date: 02/16/2020 Goal Status: Active Ulcer/skin breakdown will have a volume reduction of 30% by week 4 Date Initiated: 12/22/2019 Date Inactivated: 01/19/2020 Target Resolution Date: 01/19/2020 Goal Status: Unmet Unmet Reason: infection Ulcer/skin breakdown will have a volume reduction of 50% by week 8 Date Initiated: 01/19/2020 Target  Resolution Date: 02/16/2020 Goal Status: Active Interventions: Assess patient/caregiver ability to obtain necessary supplies Assess patient/caregiver ability to perform ulcer/skin care regimen upon admission and as needed Assess ulceration(s) every visit Provide education on ulcer and skin care Treatment Activities: Skin care regimen initiated : 12/22/2019 Topical wound management initiated : 12/22/2019 Notes: Electronic Signature(s) Signed: 02/02/2020 6:14:29 PM By: Martin Gouty RN, BSN Entered By: Martin [name] on 02/02/2020 12:20:18 -------------------------------------------------------------------------------- Pain Assessment Details Patient Name: Date of Service: Martin [name]. 02/02/2020 11:00 A M Medical Record Number: [id number] Patient Account Number: [id number] Date of Birth/Sex: Treating RN: 06-21-1945 (  74 y.o. Martin [name] Primary Care Martin [name]: Martin [name] Other Clinician: Referring Martin [name]: Treating Martin [name]/Extender: Martin [name] in Treatment: 6 Active Problems Location of Pain Severity and Description of Pain Patient Has Paino No Site Locations Pain Management and Medication Current Pain Management: Electronic Signature(s) Signed: 02/02/2020 6:14:29 PM By: Martin Gouty RN, BSN Signed: 02/03/2020 1:11:06 PM By: Martin [name] Entered By: Martin [name] on 02/02/2020 11:29:10 -------------------------------------------------------------------------------- Patient/Caregiver Education Details Patient Name: Date of Service: Martin [name] 10/6/2021andnbsp11:00 A M Medical Record Number: [id number] Patient Account Number: [id number] Date of Birth/Gender: Treating RN: 1945/07/02 (74 y.o. Martin [name] Primary Care Physician: Martin [name] Other Clinician: Referring Physician: Treating Physician/Extender: Martin [name] in Treatment: 6 Education Assessment Education Provided  To: Patient Education Topics Provided Hyperbaric Oxygenation: Methods: Explain/Verbal Responses: Reinforcements needed, State content correctly Offloading: Methods: Explain/Verbal Responses: Reinforcements needed, State content correctly Wound/Skin Impairment: Methods: Explain/Verbal Responses: Reinforcements needed, State content correctly Electronic Signature(s) Signed: 02/02/2020 6:14:29 PM By: Martin Gouty RN, BSN Entered By: Martin [name] on 02/02/2020 12:20:58 -------------------------------------------------------------------------------- Wound Assessment Details Patient Name: Date of Service: Martin [name]. 02/02/2020 11:00 A M Medical Record Number: [id number] Patient Account Number: [id number] Date of Birth/Sex: Treating RN: 1945-12-29 (74 y.o. Martin [name] Primary Care Domanick Cuccia: Martin [name] Other Clinician: Referring Eydan Chianese: Treating Shakita Keir/Extender: Martin [name] in Treatment: 6 Wound Status Wound Number: 5 Primary Diabetic Wound/Ulcer of the Lower Extremity Etiology: Wound Location: Left Metatarsal head fifth Wound Open Wounding Event: Gradually Appeared Status: Date Acquired: 10/31/2019 Comorbid Cataracts, Coronary Artery Disease, Hypertension, Type II Weeks Of Treatment: 6 History: Diabetes, Neuropathy Clustered Wound: No Photos Photo Uploaded By: Martin [name] on 02/03/2020 12:18:47 Wound Measurements Length: (cm) 1.4 Width: (cm) 1.7 Depth: (cm) 0.5 Area: (cm) 1.869 Volume: (cm) 0.935 % Reduction in Area: -54.5% % Reduction in Volume: -10.4% Epithelialization: Small (1-33%) Tunneling: No Undermining: Yes Starting Position (o'clock): 3 Ending Position (o'clock): 6 Maximum Distance: (cm) 0.5 Wound Description Classification: Grade 3 Wound Margin: Well defined, not attached Exudate Amount: Medium Exudate Type: Serosanguineous Exudate Color: red, brown Foul Odor After Cleansing: No Slough/Fibrino  No Wound Bed Granulation Amount: Large (67-100%) Exposed Structure Granulation Quality: Pink Fascia Exposed: No Necrotic Amount: None Present (0%) Fat Layer (Subcutaneous Tissue) Exposed: Yes Tendon Exposed: No Muscle Exposed: No Joint Exposed: No Bone Exposed: No Assessment Notes maceration noted to periwound. Electronic Signature(s) Signed: 02/02/2020 5:54:43 PM By: Martin [name] Signed: 02/02/2020 6:14:29 PM By: Martin Gouty RN, BSN Entered By: Martin [name] on 02/02/2020 11:51:14 -------------------------------------------------------------------------------- Vitals Details Patient Name: Date of Service: Martin [name]. 02/02/2020 11:00 A M Medical Record Number: [id number] Patient Account Number: [id number] Date of Birth/Sex: Treating RN: [birth year] (74 y.o. Martin [name] Primary Care Othell Jaime: Martin [name] Other Clinician: Referring [facility]: Treating Jodilyn Giese/Extender: Martin [name] in Treatment: 6 Vital Signs Time Taken: 11:28 Temperature (F): 98.3 Height (in): 72 Pulse (bpm): 76 Weight (lbs): 270 Respiratory Rate (breaths/min): 18 Body Mass Index (BMI): 36.6 Blood Pressure (mmHg): 151/87 Reference Range: 80 - 120 mg / dl Electronic Signature(s) Signed: 02/03/2020 1:11:06 PM By: Martin [name] Entered By: Martin [name] on 02/02/2020 11:28:56

## 2020-02-08 NOTE — Progress Notes (Signed)
Phone [phone] In person visit   Subjective:   Martin ONO Sr. is a 74 y.o. year old very pleasant male patient who presents for/with See problem oriented charting Chief Complaint  Patient presents with  . Diabetes  . Hyperlipidemia   This visit occurred during the SARS-CoV-2 public health emergency.  Safety protocols were in place, including screening questions prior to the visit, additional usage of staff PPE, and extensive cleaning of exam room while observing appropriate contact time as indicated for disinfecting solutions.   Past Medical History-  Patient Active Problem List   Diagnosis Date Noted  . Postoperative atrial fibrillation ([place]) 10/24/2018    Priority: High  . 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 Undiagnosed cardiac murmurs 08/15/2015    Priority: High  . DM (diabetes mellitus) type II uncontrolled with eye manifestation ([place]) 02/03/2007    Priority: High  . 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 PAD (peripheral artery disease) ([place]) 08/20/2017    Priority: Medium  . Partial tear of right Achilles tendon 02/12/2017    Priority: Medium  . Aortic atherosclerosis ([place]) 10/07/2016    Priority: Medium  . History of skin cancer 08/15/2015    Priority: Medium  . Diabetic retinopathy ([place]) 02/21/2015    Priority: Medium  . Hyperlipidemia associated with type 2 diabetes mellitus ([place]) 08/24/2014    Priority: Medium  . Diabetic polyneuropathy ([place]) 04/11/2009    Priority: Medium  . Hypertension associated with diabetes ([place]) 02/03/2007    Priority: Medium  . Acute blood loss as cause of postoperative anemia 10/24/2018    Priority: Low  . BPH (benign prostatic hyperplasia) 10/24/2018    Priority: Low  . History of depression 10/24/2018    Priority: Low  . S/P CABG x 4 10/01/2018    Priority: Low  . Onychomycosis of toenail 02/17/2018    Priority: Low  . Allergic rhinitis 08/20/2017     Priority: Low  . C. difficile colitis 09/25/2016    Priority: Low  . History of aspiration pneumonia 09/25/2016    Priority: Low  . Former smoker 08/24/2014    Priority: Low  . Ingrowing toenail 07/19/2014    Priority: Low  . BACK PAIN, LUMBAR, WITH RADICULOPATHY 08/30/2008    Priority: Low    Medications- reviewed and updated Current Outpatient Medications  Medication Sig Dispense Refill  . aspirin 81 MG EC tablet Take 1 tablet (81 mg total) by mouth daily.    Marland Kitchen ezetimibe (ZETIA) 10 MG tablet TAKE 1 TABLET BY MOUTH EVERY DAY 90 tablet 3  . fexofenadine (ALLEGRA) 180 MG tablet Take 180 mg by mouth daily.    . insulin aspart (NOVOLOG FLEXPEN) 100 UNIT/ML FlexPen 3 times a day (just before each meal) 25-20-25 units, and pen needles 4/day 25 pen 3  . insulin glargine (LANTUS SOLOSTAR) 100 UNIT/ML Solostar Pen Inject 10 Units into the skin at bedtime. 5 pen PRN  . Insulin Pen Needle (NOVOFINE) 32G X 6 MM MISC USE 5 TIMES DAILY 450 each 2  . linezolid (ZYVOX) 600 MG tablet Take 600 mg by mouth 2 (two) times daily.    Marland Kitchen losartan (COZAAR) 25 MG tablet Take 0.5 tablets (12.5 mg total) by mouth daily. 90 tablet 0  . metoprolol tartrate (LOPRESSOR) 50 MG tablet Take 1 tablet (50 mg total) by mouth 2 (two) times daily. 180 tablet 3  . ONETOUCH VERIO test strip CHECK LEVELS  4 TIMES DAILY 150 strip 1  . Pitavastatin Calcium 1 MG TABS Take by mouth daily.    Marland Kitchen torsemide (DEMADEX) 20 MG tablet TAKE 2 TABLETS BY MOUTH EVERY DAY (Patient taking differently: Take 20 mg by mouth daily. ) 180 tablet 3  . fluticasone (FLONASE) 50 MCG/ACT nasal spray SPRAY 2 SPRAYS INTO EACH NOSTRIL EVERY DAY (Patient not taking: Reported on 02/09/2020) 48 mL 1  . vitamin C (ASCORBIC ACID) 250 MG tablet Take 250 mg by mouth daily. (Patient not taking: Reported on 02/09/2020)     No current facility-administered medications for this visit.     Objective:  BP 130/70   Pulse 91   Temp (!) 97.2 F (36.2 C) (Temporal)    Ht 6\' 1"  (1.854 m)   Wt 271 lb 9.6 oz (123.2 kg) Comment: patient has a boot and cast on  SpO2 97%   BMI 35.83 kg/m  Gen: NAD, resting comfortably CV: RRR no murmurs rubs or gallops Lungs: CTAB no crackles, wheeze, rhonchi Ext: 1+ edema on right, left leg in cast/brace Skin: warm, dry     Assessment and Plan   # diabetic ulcer left foot- was trying to exercise and blister opened up on his foot.  Dr. Sharol Given thought may need amputation. Patient has seen wound care  (got appointment moved up thankfully) and they have it in a cast and change weekly- seems to be making progress. Also on antibiotics. May need hyperbaric treatment  #anxiety- particularly with covid 19. Didn't tolerate xanax- worsened panic. He had considered behavioral health. He states he is doing better with being more confident in [place] also wearing kn95.   #hypertension S: medication: metoprolol 50Mg  (half tablet BID), losartan 25 mg (half tablet dailly),  Torsemide 1 tablet every 3 days BP Readings from Last 3 Encounters:  02/09/20 130/70  02/01/20 (!) 149/81  12/20/19 130/70  A/P: good control- continue current meds  #CAD- compliant with aspirin. Denies chest pain. Does have some separation of breast bone he may have repaired.  #hyperlipidemia S: Medication: Zetia 10Mg , pitavistatin calcium 1Mg  Lab Results  Component Value Date   CHOL 132 07/08/2019   HDL 46.10 07/08/2019   LDLCALC 71 07/08/2019   LDLDIRECT 173.0 07/01/2018   TRIG 75.0 07/08/2019   CHOLHDL 3 07/08/2019   A/P: lipids much improved with livalo/zetia combo- continue current meds- full lipid panel next visit  CAD stable- continue current meds  # Diabetes S: Medication: Lantus, Novolog CBGs- #s slightly higher lately. Was 137 middle of night and was 192 when he woke up this AM Exercise and diet- stable weight despite boot. Exercise limited by boot Lab Results  Component Value Date   HGBA1C 7.6 (H) 07/08/2019   HGBA1C 8.2 (H) 01/06/2019    HGBA1C 8.1 (H) 09/29/2018   A/P:  Reasonable control on last check- will update a1c today and encouraged follow up with Dr. Loanne Drilling  Recommended follow up: Return in about 6 months (around 08/09/2020) for physical or sooner if needed. Future Appointments  Date Time Provider [place]  02/10/2020 11:00 AM Ricard Dillon, MD [facility]  02/16/2020 10:00 AM Jeri Cos [place] III, PA-C [facility]  02/23/2020 10:15 AM Jeri Cos [place] III, PA-C [facility]   Lab/Order associations:   ICD-10-CM   1. Hypertension associated with diabetes ([place])  E11.59    I15.2   2. DM (diabetes mellitus) type II uncontrolled with eye manifestation ([facility])  E11.39 Hemoglobin A1c   E11.65   3. Hyperlipidemia  associated with type 2 diabetes mellitus ([place])  E11.69    E78.5   4. Diabetic ulcer of left foot associated with type 2 diabetes mellitus, with fat layer exposed, unspecified part of foot ([place])  E11.621    L97.522    No orders of the defined types were placed in this encounter.  Return precautions advised.  [name], MD

## 2020-02-08 NOTE — Patient Instructions (Addendum)
Health Maintenance Due  Topic Date Due  . INFLUENZA VACCINE In office flu shot today high dose 11/28/2019   Pretty please complete your cologuard or let us know if you get to point want to do colonoscopy   Please stop by lab before you go If you have mychart- we will send your results within 3 business days of [place] receiving them.  If you do not have mychart- we will call you about results within 5 business days of [place] receiving them.  *please note we are currently using [facility] which has a longer processing time than [place] typically so labs may not come back as quickly as in the past *please also note that you will see labs on mychart as soon as they post. I will later go in and write notes on them- will say "notes from Dr. Yong Channel"

## 2020-02-09 ENCOUNTER — Encounter: Payer: Self-pay | Admitting: Family Medicine

## 2020-02-09 ENCOUNTER — Other Ambulatory Visit: Payer: Self-pay | Admitting: Orthopedic Surgery

## 2020-02-09 ENCOUNTER — Telehealth: Payer: Self-pay | Admitting: Family Medicine

## 2020-02-09 ENCOUNTER — Other Ambulatory Visit: Payer: Self-pay

## 2020-02-09 ENCOUNTER — Ambulatory Visit (INDEPENDENT_AMBULATORY_CARE_PROVIDER_SITE_OTHER): Payer: PPO | Admitting: Family Medicine

## 2020-02-09 VITALS — BP 130/70 | HR 91 | Temp 97.2°F | Ht 73.0 in | Wt 271.6 lb

## 2020-02-09 DIAGNOSIS — E785 Hyperlipidemia, unspecified: Secondary | ICD-10-CM | POA: Diagnosis not present

## 2020-02-09 DIAGNOSIS — IMO0002 Reserved for concepts with insufficient information to code with codable children: Secondary | ICD-10-CM

## 2020-02-09 DIAGNOSIS — I2510 Atherosclerotic heart disease of native coronary artery without angina pectoris: Secondary | ICD-10-CM | POA: Diagnosis not present

## 2020-02-09 DIAGNOSIS — L97522 Non-pressure chronic ulcer of other part of left foot with fat layer exposed: Secondary | ICD-10-CM

## 2020-02-09 DIAGNOSIS — E1165 Type 2 diabetes mellitus with hyperglycemia: Secondary | ICD-10-CM

## 2020-02-09 DIAGNOSIS — E11621 Type 2 diabetes mellitus with foot ulcer: Secondary | ICD-10-CM

## 2020-02-09 DIAGNOSIS — Z1211 Encounter for screening for malignant neoplasm of colon: Secondary | ICD-10-CM

## 2020-02-09 DIAGNOSIS — E1159 Type 2 diabetes mellitus with other circulatory complications: Secondary | ICD-10-CM | POA: Diagnosis not present

## 2020-02-09 DIAGNOSIS — E1139 Type 2 diabetes mellitus with other diabetic ophthalmic complication: Secondary | ICD-10-CM

## 2020-02-09 DIAGNOSIS — E1169 Type 2 diabetes mellitus with other specified complication: Secondary | ICD-10-CM | POA: Diagnosis not present

## 2020-02-09 DIAGNOSIS — Z23 Encounter for immunization: Secondary | ICD-10-CM

## 2020-02-09 DIAGNOSIS — I152 Hypertension secondary to endocrine disorders: Secondary | ICD-10-CM

## 2020-02-09 NOTE — Chronic Care Management (AMB) (Signed)
  Chronic Care Management   Outreach Note  02/09/2020 Name: Martin Mathews [name] MRN: [id number] DOB: 29-Jan-1946  Referred by: Marin Olp, MD Reason for referral : No chief complaint on file.   A second unsuccessful telephone outreach was attempted today. The patient was referred to pharmacist for assistance with care management and care coordination.  Follow Up Plan:   Lauretta Grill Upstream Scheduler

## 2020-02-09 NOTE — Addendum Note (Signed)
Addended by: Thomes Cake on: 02/09/2020 10:34 AM   Modules accepted: Orders

## 2020-02-10 ENCOUNTER — Encounter (HOSPITAL_BASED_OUTPATIENT_CLINIC_OR_DEPARTMENT_OTHER): Payer: PPO | Admitting: Internal Medicine

## 2020-02-10 DIAGNOSIS — E11621 Type 2 diabetes mellitus with foot ulcer: Secondary | ICD-10-CM | POA: Diagnosis not present

## 2020-02-10 DIAGNOSIS — L97522 Non-pressure chronic ulcer of other part of left foot with fat layer exposed: Secondary | ICD-10-CM | POA: Diagnosis not present

## 2020-02-10 LAB — COMPLETE METABOLIC PANEL WITH GFR
AG Ratio: 1.5 (calc) (ref 1.0–2.5)
ALT: 22 U/L (ref 9–46)
AST: 23 U/L (ref 10–35)
Albumin: 4.4 g/dL (ref 3.6–5.1)
Alkaline phosphatase (APISO): 44 U/L (ref 35–144)
BUN/Creatinine Ratio: 17 (calc) (ref 6–22)
BUN: 24 mg/dL (ref 7–25)
CO2: 27 mmol/L (ref 20–32)
Calcium: 9.3 mg/dL (ref 8.6–10.3)
Chloride: 102 mmol/L (ref 98–110)
Creat: 1.39 mg/dL — ABNORMAL HIGH (ref 0.70–1.18)
GFR, Est African American: 57 mL/min/{1.73_m2} — ABNORMAL LOW (ref >=60)
GFR, Est Non African American: 49 mL/min/{1.73_m2} — ABNORMAL LOW (ref >=60)
Globulin: 3 g/dL (calc) (ref 1.9–3.7)
Glucose, Bld: 161 mg/dL — ABNORMAL HIGH (ref 65–99)
Potassium: 4.6 mmol/L (ref 3.5–5.3)
Sodium: 138 mmol/L (ref 135–146)
Total Bilirubin: 1.9 mg/dL — ABNORMAL HIGH (ref 0.2–1.2)
Total Protein: 7.4 g/dL (ref 6.1–8.1)

## 2020-02-10 LAB — CBC WITH DIFFERENTIAL/PLATELET
Absolute Monocytes: 497 cells/uL (ref 200–950)
Basophils Absolute: 22 cells/uL (ref 0–200)
Basophils Relative: 0.3 %
Eosinophils Absolute: 144 cells/uL (ref 15–500)
Eosinophils Relative: 2 %
HCT: 38 % — ABNORMAL LOW (ref 38.5–50.0)
Hemoglobin: 12.5 g/dL — ABNORMAL LOW (ref 13.2–17.1)
Lymphs Abs: 1699 cells/uL (ref 850–3900)
MCH: 29.9 pg (ref 27.0–33.0)
MCHC: 32.9 g/dL (ref 32.0–36.0)
MCV: 90.9 fL (ref 80.0–100.0)
MPV: 9.9 fL (ref 7.5–12.5)
Monocytes Relative: 6.9 %
Neutro Abs: 4838 cells/uL (ref 1500–7800)
Neutrophils Relative %: 67.2 %
Platelets: 239 10*3/uL (ref 140–400)
RBC: 4.18 10*6/uL — ABNORMAL LOW (ref 4.20–5.80)
RDW: 12.9 % (ref 11.0–15.0)
Total Lymphocyte: 23.6 %
WBC: 7.2 10*3/uL (ref 3.8–10.8)

## 2020-02-10 LAB — HEMOGLOBIN A1C
Hgb A1c MFr Bld: 8.6 % of total Hgb — ABNORMAL HIGH (ref <5.7)
Mean Plasma Glucose: 200 (calc)
eAG (mmol/L): 11.1 (calc)
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
## 2020-02-11 NOTE — Progress Notes (Signed)
Martin [name], Martin [name] ([id number]) Visit Report for 02/10/2020 Arrival Information Details Patient Name: Date of Service: Martin [name] 02/10/2020 11:00 A M Medical Record Number: [id number] Patient Account Number: [id number] Date of Birth/Sex: Treating RN: [birth year] (74 y.o. [name] Primary Care Jazzlyn Huizenga: [name] Other Clinician: Referring Kelcie Currie: Treating Khrista Braun/Extender: [name] in Treatment: 7 Visit Information History Since Last Visit Added or deleted any medications: No Patient Arrived: [name] Any new allergies or adverse reactions: No Arrival Time: 11:08 Had a fall or experienced change in No Accompanied By: wife activities of daily living that may affect Transfer Assistance: None risk of falls: Patient Identification Verified: Yes Signs or symptoms of abuse/neglect since last visito No Secondary Verification Process Completed: Yes Hospitalized since last visit: No Patient Requires Transmission-Based Precautions: No Implantable device outside of the clinic excluding No Patient Has Alerts: No cellular tissue based products placed in the center since last visit: Has Dressing in Place as Prescribed: Yes Pain Present Now: Yes Electronic Signature(s) Signed: 02/11/2020 11:16:13 AM By: Sandre Kitty Entered By: Sandre Kitty on 02/10/2020 11:08:49 -------------------------------------------------------------------------------- Lower Extremity Assessment Details Patient Name: Date of Service: Martin [name] 02/10/2020 11:00 A M Medical Record Number: [id number] Patient Account Number: [id number] Date of Birth/Sex: Treating RN: [birth year] (74 y.o. [name] Primary Care Keiasha Diep: [name] Other Clinician: Referring Zaakirah Kistner: Treating Nydia Ytuarte/Extender: [name] in Treatment: 7 Edema Assessment Assessed: [name]: Yes] [name]: No] Edema: [Left: Ye] [Right: s] Calf Left:  Right: Point of Measurement: From Medial Instep 38 cm Ankle Left: Right: Point of Measurement: From Medial Instep 26 cm Vascular Assessment Pulses: Dorsalis Pedis Palpable: [Left:Yes] Electronic Signature(s) Signed: 02/10/2020 5:51:00 PM By: Deon Pilling Entered By: Deon Pilling on 02/10/2020 11:40:57 -------------------------------------------------------------------------------- Multi Wound Chart Details Patient Name: Date of Service: Martin [name]. 02/10/2020 11:00 A M Medical Record Number: [id number] Patient Account Number: [id number] Date of Birth/Sex: Treating RN: 1946/01/14 (74 y.o. [name], [name] Primary Care Delos Klich: [name] Other Clinician: Referring Soniyah Mcglory: Treating Jaylea Plourde/Extender: [name] in Treatment: 7 Vital Signs Height(in): 72 Pulse(bpm): 88 Weight(lbs): 270 Blood Pressure(mmHg): 170/88 Body Mass Index(BMI): 37 Temperature(F): 98.2 Respiratory Rate(breaths/min): 18 Photos: [5:No Photos Left Metatarsal head fifth] [N/A:N/A N/A] Wound Location: [5:Gradually Appeared] [N/A:N/A] Wounding Event: [5:Diabetic Wound/Ulcer of the Lower] [N/A:N/A] Primary Etiology: [5:Extremity Cataracts, Coronary Artery Disease, N/A] Comorbid History: [5:Hypertension, Type II Diabetes, Neuropathy 10/31/2019] [N/A:N/A] Date Acquired: [5:7] [N/A:N/A] Weeks of Treatment: [5:Open] [N/A:N/A] Wound Status: [5:1x1.2x0.3] [N/A:N/A] Measurements L x W x D (cm) [5:0.942] [N/A:N/A] A (cm) : rea [5:0.283] [N/A:N/A] Volume (cm) : [5:22.10%] [N/A:N/A] % Reduction in A rea: [5:66.60%] [N/A:N/A] % Reduction in Volume: [5:Grade 3] [N/A:N/A] Classification: [5:Medium] [N/A:N/A] Exudate A mount: [5:Serosanguineous] [N/A:N/A] Exudate Type: [5:red, brown] [N/A:N/A] Exudate Color: [5:Well defined, not attached] [N/A:N/A] Wound Margin: [5:Large (67-100%)] [N/A:N/A] Granulation A mount: [5:Pink] [N/A:N/A] Granulation Quality: [5:Small (1-33%)]  [N/A:N/A] Necrotic A mount: [5:Fat Layer (Subcutaneous Tissue): Yes N/A] Exposed Structures: [5:Fascia: No Tendon: No Muscle: No Joint: No Bone: No Medium (34-66%)] [N/A:N/A] Epithelialization: [5:T Contact Cast otal] [N/A:N/A] Treatment Notes Electronic Signature(s) Signed: 02/10/2020 5:33:07 PM By: Linton Ham MD Signed: 02/10/2020 5:51:00 PM By: Deon Pilling Entered By: Linton Ham on 02/10/2020 12:39:18 -------------------------------------------------------------------------------- Multi-Disciplinary Care Plan Details Patient Name: Date of Service: Martin [name]. 02/10/2020 11:00 A M Medical Record Number: [id number] Patient Account Number: [id number] Date of Birth/Sex: Treating RN: 1945-05-01 (74 y.o. [name] Primary Care Annslee Tercero: [name] Other Clinician: Referring Kele Barthelemy: Treating  Heavin Sebree/Extender: [name] in Treatment: 7 Active Inactive Nutrition Nursing Diagnoses: Impaired glucose control: actual or potential Potential for alteratiion in Nutrition/Potential for imbalanced nutrition Goals: Patient/caregiver will maintain therapeutic glucose control Date Initiated: 12/22/2019 Target Resolution Date: 02/25/2020 Goal Status: Active Interventions: Assess HgA1c results as ordered upon admission and as needed Treatment Activities: Patient referred to Primary Care Physician for further nutritional evaluation : 12/22/2019 Notes: Wound/Skin Impairment Nursing Diagnoses: Impaired tissue integrity Knowledge deficit related to ulceration/compromised skin integrity Goals: Patient/caregiver will verbalize understanding of skin care regimen Date Initiated: 12/22/2019 Target Resolution Date: 02/16/2020 Goal Status: Active Ulcer/skin breakdown will have a volume reduction of 30% by week 4 Date Initiated: 12/22/2019 Date Inactivated: 01/19/2020 Target Resolution Date: 01/19/2020 Goal Status: Unmet Unmet Reason:  infection Ulcer/skin breakdown will have a volume reduction of 50% by week 8 Date Initiated: 01/19/2020 Target Resolution Date: 02/16/2020 Goal Status: Active Interventions: Assess patient/caregiver ability to obtain necessary supplies Assess patient/caregiver ability to perform ulcer/skin care regimen upon admission and as needed Assess ulceration(s) every visit Provide education on ulcer and skin care Treatment Activities: Skin care regimen initiated : 12/22/2019 Topical wound management initiated : 12/22/2019 Notes: Electronic Signature(s) Signed: 02/10/2020 5:51:00 PM By: Deon Pilling Entered By: Deon Pilling on 02/10/2020 11:55:51 -------------------------------------------------------------------------------- Pain Assessment Details Patient Name: Date of Service: RY, MOODY 02/10/2020 11:00 A M Medical Record Number: [id number] Patient Account Number: [id number] Date of Birth/Sex: Treating RN: 19-Nov-1945 (74 y.o. [name] Primary Care Bevelyn Arriola: [name] Other Clinician: Referring Barnet Benavides: Treating [name] [name]/Extender: [name] in Treatment: 7 Active Problems Location of Pain Severity and Description of Pain Patient Has Paino Yes Site Locations Rate the pain. Current Pain Level: 3 Pain Management and Medication Current Pain Management: Electronic Signature(s) Signed: 02/10/2020 5:51:00 PM By: Deon Pilling Signed: 02/11/2020 11:16:13 AM By: Sandre Kitty Entered By: Sandre Kitty on 02/10/2020 11:09:26 -------------------------------------------------------------------------------- Patient/Caregiver Education Details Patient Name: Date of Service: Martin [name] 10/14/2021andnbsp11:00 A M Medical Record Number: [id number] Patient Account Number: [id number] Date of Birth/Gender: Treating RN: 08-10-1945 (74 y.o. [name] Primary Care Physician: [name] Other Clinician: Referring  Physician: Treating Physician/Extender: [name] in Treatment: 7 Education Assessment Education Provided To: Patient Education Topics Provided Wound/Skin Impairment: Handouts: Skin Care Do's and Dont's Methods: Explain/Verbal Responses: Reinforcements needed Electronic Signature(s) Signed: 02/10/2020 5:51:00 PM By: Deon Pilling Entered By: Deon Pilling on 02/10/2020 11:56:04 -------------------------------------------------------------------------------- Wound Assessment Details Patient Name: Date of Service: TAEGEN, DELKER 02/10/2020 11:00 A M Medical Record Number: [id number] Patient Account Number: [id number] Date of Birth/Sex: Treating RN: Jan 31, 1946 (74 y.o. [name], [name] Primary Care Yanni Quiroa: [name] Other Clinician: Referring Breya Cass: Treating Estrella Alcaraz/Extender: [name] in Treatment: 7 Wound Status Wound Number: 5 Primary Diabetic Wound/Ulcer of the Lower Extremity Etiology: Wound Location: Left Metatarsal head fifth Wound Open Wounding Event: Gradually Appeared Status: Date Acquired: 10/31/2019 Comorbid Cataracts, Coronary Artery Disease, Hypertension, Type II Weeks Of Treatment: 7 History: Diabetes, Neuropathy Clustered Wound: No Wound Measurements Length: (cm) 1 Width: (cm) 1.2 Depth: (cm) 0.3 Area: (cm) 0.942 Volume: (cm) 0.283 % Reduction in Area: 22.1% % Reduction in Volume: 66.6% Epithelialization: Medium (34-66%) Tunneling: No Undermining: No Wound Description Classification: Grade 3 Wound Margin: Well defined, not attached Exudate Amount: Medium Exudate Type: Serosanguineous Exudate Color: red, brown Foul Odor After Cleansing: No Slough/Fibrino Yes Wound Bed Granulation Amount: Large (67-100%) Exposed Structure Granulation Quality: Pink Fascia Exposed: No Necrotic Amount: Small (1-33%) Fat Layer (  Subcutaneous Tissue) Exposed: Yes Necrotic Quality: Adherent  Slough Tendon Exposed: No Muscle Exposed: No Joint Exposed: No Bone Exposed: No Electronic Signature(s) Signed: 02/10/2020 5:51:00 PM By: Deon Pilling Entered By: Deon Pilling on 02/10/2020 11:43:44 -------------------------------------------------------------------------------- Vitals Details Patient Name: Date of Service: Martin [name]. 02/10/2020 11:00 A M Medical Record Number: [id number] Patient Account Number: [id number] Date of Birth/Sex: Treating RN: [birth year] (74 y.o. [name] Primary Care Andrina Locken: [name] Other Clinician: Referring Arik Husmann: Treating Emigdio Wildeman/Extender: [name] in Treatment: 7 Vital Signs Time Taken: 11:08 Temperature (F): 98.2 Height (in): 72 Pulse (bpm): 88 Weight (lbs): 270 Respiratory Rate (breaths/min): 18 Body Mass Index (BMI): 36.6 Blood Pressure (mmHg): 170/88 Reference Range: 80 - 120 mg / dl Electronic Signature(s) Signed: 02/11/2020 11:16:13 AM By: Sandre Kitty Entered By: Sandre Kitty on 02/10/2020 11:09:13

## 2020-02-14 ENCOUNTER — Other Ambulatory Visit: Payer: Self-pay

## 2020-02-14 DIAGNOSIS — IMO0002 Reserved for concepts with insufficient information to code with codable children: Secondary | ICD-10-CM

## 2020-02-14 DIAGNOSIS — E1139 Type 2 diabetes mellitus with other diabetic ophthalmic complication: Secondary | ICD-10-CM

## 2020-02-14 NOTE — Progress Notes (Signed)
cmp ordered 

## 2020-02-16 ENCOUNTER — Encounter (HOSPITAL_BASED_OUTPATIENT_CLINIC_OR_DEPARTMENT_OTHER): Payer: PPO | Admitting: Physician Assistant

## 2020-02-16 ENCOUNTER — Other Ambulatory Visit: Payer: Self-pay

## 2020-02-16 DIAGNOSIS — M86272 Subacute osteomyelitis, left ankle and foot: Secondary | ICD-10-CM | POA: Diagnosis not present

## 2020-02-16 DIAGNOSIS — L97522 Non-pressure chronic ulcer of other part of left foot with fat layer exposed: Secondary | ICD-10-CM | POA: Diagnosis not present

## 2020-02-16 DIAGNOSIS — E11621 Type 2 diabetes mellitus with foot ulcer: Secondary | ICD-10-CM | POA: Diagnosis not present

## 2020-02-16 NOTE — Progress Notes (Signed)
Martin [name] ([id number]) Visit Report for 02/16/2020 Arrival Information Details Patient Name: Date of Service: Martin [name], POYNTER 02/16/2020 10:00 A M Medical Record Number: [id number] Patient Account Number: [id number] Date of Birth/Sex: Treating RN: [birth year] (74 y.o. [name] Primary Care Karimah Winquist: [name] Other Clinician: Referring Josejuan Hoaglin: Treating Kelly Eisler/Extender: Sandria Bales in Treatment: 8 Visit Information History Since Last Visit Added or deleted any medications: No Patient Arrived: Ambulatory Any new allergies or adverse reactions: No Arrival Time: 10:05 Had a fall or experienced change in No Accompanied By: wife activities of daily living that may affect Transfer Assistance: None risk of falls: Patient Identification Verified: Yes Signs or symptoms of abuse/neglect since last visito No Secondary Verification Process Completed: Yes Hospitalized since last visit: No Patient Requires Transmission-Based Precautions: No Implantable device outside of the clinic excluding No Patient Has Alerts: No cellular tissue based products placed in the center since last visit: Has Dressing in Place as Prescribed: Yes Has Footwear/Offloading in Place as Prescribed: Yes Left: T Contact Cast otal Pain Present Now: No Notes patient c/o nausea and rash on back. Per patient urine has been burning his skin causing a rash in his groin area. Electronic Signature(s) Signed: 02/16/2020 4:36:52 PM By: Deon Pilling Entered By: Deon Pilling on 02/16/2020 10:13:48 -------------------------------------------------------------------------------- Encounter Discharge Information Details Patient Name: Date of Service: Martin [name]. 02/16/2020 10:00 A M Medical Record Number: [id number] Patient Account Number: [id number] Date of Birth/Sex: Treating RN: 04/03/1946 (74 y.o. [name] Primary Care Sakinah Rosamond: [name] Other  Clinician: Referring Fowler Antos: Treating Bellamy Judson/Extender: Sandria Bales in Treatment: 8 Encounter Discharge Information Items Discharge Condition: Stable Ambulatory Status: Ambulatory Discharge Destination: Home Transportation: Private Auto Accompanied By: wife Schedule Follow-up Appointment: Yes Clinical Summary of Care: Notes explained the nystatin powder to groin area twice a day. patient in agreement. Electronic Signature(s) Signed: 02/16/2020 4:36:52 PM By: Deon Pilling Entered By: Deon Pilling on 02/16/2020 11:10:19 -------------------------------------------------------------------------------- Lower Extremity Assessment Details Patient Name: Date of Service: Martin [name] 02/16/2020 10:00 A M Medical Record Number: [id number] Patient Account Number: [id number] Date of Birth/Sex: Treating RN: 1946-02-16 (74 y.o. [name] Primary Care Shanty Ginty: [name] Other Clinician: Referring Jayshawn Colston: Treating Keiarra Charon/Extender: Sandria Bales in Treatment: 8 Edema Assessment Assessed: [name]: Yes] [Right: No] Edema: [Left: Ye] [Right: s] Calf Left: Right: Point of Measurement: From Medial Instep 41 cm Ankle Left: Right: Point of Measurement: From Medial Instep 26.5 cm Vascular Assessment Pulses: Dorsalis Pedis Palpable: [Left:Yes] Electronic Signature(s) Signed: 02/16/2020 4:36:52 PM By: Deon Pilling Entered By: Deon Pilling on 02/16/2020 10:14:54 -------------------------------------------------------------------------------- [place] Details Patient Name: Date of Service: Martin [name]. 02/16/2020 10:00 A M Medical Record Number: [id number] Patient Account Number: [id number] Date of Birth/Sex: Treating RN: [birth year] (74 y.o. Martin [name] Primary Care Emma-Lee Oddo: [name] Other Clinician: Referring Libra Gatz: Treating Wilmoth Rasnic/Extender: Sandria Bales in Treatment: 8 Active Inactive Nutrition Nursing Diagnoses: Impaired glucose control: actual or potential Potential for alteratiion in Nutrition/Potential for imbalanced nutrition Goals: Patient/caregiver will maintain therapeutic glucose control Date Initiated: 12/22/2019 Target Resolution Date: 02/25/2020 Goal Status: Active Interventions: Assess HgA1c results as ordered upon admission and as needed Treatment Activities: Patient referred to Primary Care Physician for further nutritional evaluation : 12/22/2019 Notes: Wound/Skin Impairment Nursing Diagnoses: Impaired tissue integrity Knowledge deficit related to ulceration/compromised skin integrity Goals: Patient/caregiver will verbalize understanding of skin care regimen Date Initiated: 12/22/2019 Target Resolution Date: 03/15/2020  Goal Status: Active Ulcer/skin breakdown will have a volume reduction of 30% by week 4 Date Initiated: 12/22/2019 Date Inactivated: 01/19/2020 Target Resolution Date: 01/19/2020 Goal Status: Unmet Unmet Reason: infection Ulcer/skin breakdown will have a volume reduction of 50% by week 8 Date Initiated: 01/19/2020 Date Inactivated: 02/16/2020 Target Resolution Date: 02/16/2020 Goal Status: Unmet Unmet Reason: infection Ulcer/skin breakdown will have a volume reduction of 80% by week 12 Date Initiated: 02/16/2020 Target Resolution Date: 03/15/2020 Goal Status: Active Interventions: Assess patient/caregiver ability to obtain necessary supplies Assess patient/caregiver ability to perform ulcer/skin care regimen upon admission and as needed Assess ulceration(s) every visit Provide education on ulcer and skin care Treatment Activities: Skin care regimen initiated : 12/22/2019 Topical wound management initiated : 12/22/2019 Notes: Electronic Signature(s) Signed: 02/16/2020 4:55:29 PM By: Baruch Gouty RN, BSN Entered By: Baruch Gouty on 02/16/2020  10:10:04 -------------------------------------------------------------------------------- Non-Wound Condition Assessment Details Patient Name: Date of Service: Martin [name]. 02/16/2020 10:00 A M Medical Record Number: [id number] Patient Account Number: [id number] Date of Birth/Sex: Treating RN: 1945-08-03 (74 y.o. [name] Primary Care Martin [name]: [name] Other Clinician: Referring Ramonita Koenig: Treating Makaylie Dedeaux/Extender: Sandria Bales in Treatment: 8 Non-Wound Condition: Condition: Rash / Dermatitis Location: Groin Side: Notes redden rash like area noted between legs and groin area. Electronic Signature(s) Signed: 02/16/2020 4:36:52 PM By: Deon Pilling Entered By: Deon Pilling on 02/16/2020 11:07:16 -------------------------------------------------------------------------------- Non-Wound Condition Assessment Details Patient Name: Date of Service: Martin [name], Martin [name] 02/16/2020 10:00 A M Medical Record Number: [id number] Patient Account Number: [id number] Date of Birth/Sex: Treating RN: 1945-12-03 (74 y.o. [name] Primary Care Dorian Renfro: [name] Other Clinician: Referring Kendarius Vigen: Treating Rasool Rommel/Extender: Sandria Bales in Treatment: 8 Non-Wound Condition: Condition: Rash / Dermatitis Location: Back Side: Notes mild light pink rash like area noted to entire back. Electronic Signature(s) Signed: 02/16/2020 4:36:52 PM By: Deon Pilling Entered By: Deon Pilling on 02/16/2020 11:07:58 -------------------------------------------------------------------------------- Pain Assessment Details Patient Name: Date of Service: ANAY, RATHE 02/16/2020 10:00 A M Medical Record Number: [id number] Patient Account Number: [id number] Date of Birth/Sex: Treating RN: [birth year] (74 y.o. [name] Primary Care Richards Pherigo: [name] Other Clinician: Referring Tacia Hindley: Treating  Zyon Rosser/Extender: Sandria Bales in Treatment: 8 Active Problems Location of Pain Severity and Description of Pain Patient Has Paino No Site Locations Rate the pain. Current Pain Level: 0 Pain Management and Medication Current Pain Management: Medication: No Cold Application: No Rest: No Massage: No Activity: No T.E.N.S.: No Heat Application: No Leg drop or elevation: No Is the Current Pain Management Adequate: Adequate How does your wound impact your activities of daily livingo Sleep: No Bathing: No Appetite: No Relationship With Others: No Bladder Continence: No Emotions: No Bowel Continence: No Work: No Toileting: No Drive: No Dressing: No Hobbies: No Electronic Signature(s) Signed: 02/16/2020 4:36:52 PM By: Deon Pilling Entered By: Deon Pilling on 02/16/2020 10:14:42 -------------------------------------------------------------------------------- Patient/Caregiver Education Details Patient Name: Date of Service: Fessel, Christpher W. 10/20/2021andnbsp10:00 A M Medical Record Number: [id number] Patient Account Number: [id number] Date of Birth/Gender: Treating RN: 09-Jul-1945 (74 y.o. Martin [name] Primary Care Physician: [name] Other Clinician: Referring Physician: Treating Physician/Extender: Sandria Bales in Treatment: 8 Education Assessment Education Provided To: Patient Education Topics Provided Offloading: Methods: Explain/Verbal Responses: Reinforcements needed, State content correctly Wound/Skin Impairment: Methods: Explain/Verbal Responses: Reinforcements needed, State content correctly Electronic Signature(s) Signed: 02/16/2020 4:55:29 PM By: Baruch Gouty RN, BSN Entered By: Baruch Gouty on 02/16/2020 10:10:28 --------------------------------------------------------------------------------  Wound Assessment Details Patient Name: Date of Service: PHILANDER, AKE 02/16/2020  10:00 A M Medical Record Number: [id number] Patient Account Number: [id number] Date of Birth/Sex: Treating RN: 1946-04-27 (74 y.o. [name], [name] Primary Care Jasminemarie Sherrard: [name] Other Clinician: Referring Kris Burd: Treating Jaisa Defino/Extender: Sandria Bales in Treatment: 8 Wound Status Wound Number: 5 Primary Diabetic Wound/Ulcer of the Lower Extremity Etiology: Wound Location: Left Metatarsal head fifth Wound Open Wounding Event: Gradually Appeared Status: Date Acquired: 10/31/2019 Date Acquired: 10/31/2019 Comorbid Cataracts, Coronary Artery Disease, Hypertension, Type II Weeks Of Treatment: 8 History: Diabetes, Neuropathy Clustered Wound: No Wound Measurements Length: (cm) 1 Width: (cm) 1 Depth: (cm) 0.5 Area: (cm) 0.785 Volume: (cm) 0.393 % Reduction in Area: 35.1% % Reduction in Volume: 53.6% Epithelialization: Medium (34-66%) Tunneling: No Undermining: No Wound Description Classification: Grade 3 Wound Margin: Well defined, not attached Exudate Amount: Medium Exudate Type: Serosanguineous Exudate Color: red, brown Foul Odor After Cleansing: No Slough/Fibrino Yes Wound Bed Granulation Amount: Medium (34-66%) Exposed Structure Granulation Quality: Pink Fascia Exposed: No Necrotic Amount: Medium (34-66%) Fat Layer (Subcutaneous Tissue) Exposed: Yes Necrotic Quality: Adherent Slough Tendon Exposed: No Muscle Exposed: No Joint Exposed: No Bone Exposed: No Assessment Notes callus noted to periwound. Treatment Notes Wound #5 (Left Metatarsal head fifth) 1. Cleanse With Wound Cleanser Soap and water 3. Primary Dressing Applied Calcium Alginate Ag 4. Secondary Dressing Dry Gauze Roll Gauze Foam Drawtex 5. Secured With Medipore tape 7. Footwear/Offloading device applied T Contact Cast otal Notes foam donut. extra padding to left foot. last layer of TCC applied by PA. Electronic Signature(s) Signed: 02/16/2020 4:36:52  PM By: Deon Pilling Entered By: Deon Pilling on 02/16/2020 10:15:28 -------------------------------------------------------------------------------- Vitals Details Patient Name: Date of Service: Martin [name]. 02/16/2020 10:00 A M Medical Record Number: [id number] Patient Account Number: [id number] Date of Birth/Sex: Treating RN: 04-08-1946 (74 y.o. [name], [name] Primary Care Aliyanna Wassmer: [name] Other Clinician: Referring Avrielle Fry: Treating Sible Straley/Extender: Sandria Bales in Treatment: 8 Vital Signs Time Taken: 10:06 Temperature (F): 97.8 Height (in): 72 Pulse (bpm): 99 Weight (lbs): 270 Respiratory Rate (breaths/min): 20 Body Mass Index (BMI): 36.6 Blood Pressure (mmHg): 174/108 Capillary Blood Glucose (mg/dl): 190 Reference Range: 80 - 120 mg / dl Notes per patient has not taken BP medication this morning related to not eating breakfast. Electronic Signature(s) Signed: 02/16/2020 4:36:52 PM By: Deon Pilling Entered By: Deon Pilling on 02/16/2020 10:14:35

## 2020-02-17 ENCOUNTER — Encounter (HOSPITAL_BASED_OUTPATIENT_CLINIC_OR_DEPARTMENT_OTHER): Payer: PPO | Admitting: Internal Medicine

## 2020-02-17 NOTE — Progress Notes (Addendum)
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 CPT4 Code:  [id number] Description: 29445 - APPLY TOTAL CONTACT LEG CAST ICD-10 Diagnosis Description 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 Modifier: Quantity: 1 Physician Procedures : CPT4 Code Description Modifier [id number] 94327 - WC PHYS LEVEL 4 - EST PT 25 ICD-10 Diagnosis Description E11.621 Type 2 diabetes mellitus with foot ulcer L97.522 Non-pressure chronic ulcer of other part of left foot with fat layer exposed I10 Essential  (primary) hypertension I25.10 Atherosclerotic heart disease of native coronary artery without angina pectoris Quantity: 1 : [id number] 29445 - WC PHYS APPLY TOTAL CONTACT CAST ICD-10 Diagnosis Description 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

## 2020-02-18 ENCOUNTER — Encounter (HOSPITAL_BASED_OUTPATIENT_CLINIC_OR_DEPARTMENT_OTHER): Payer: PPO | Admitting: Internal Medicine

## 2020-02-21 ENCOUNTER — Encounter (HOSPITAL_BASED_OUTPATIENT_CLINIC_OR_DEPARTMENT_OTHER): Payer: PPO | Admitting: Internal Medicine

## 2020-02-22 ENCOUNTER — Other Ambulatory Visit: Payer: Self-pay

## 2020-02-22 ENCOUNTER — Encounter (HOSPITAL_BASED_OUTPATIENT_CLINIC_OR_DEPARTMENT_OTHER): Payer: PPO | Admitting: Internal Medicine

## 2020-02-22 DIAGNOSIS — E11621 Type 2 diabetes mellitus with foot ulcer: Secondary | ICD-10-CM | POA: Diagnosis not present

## 2020-02-22 DIAGNOSIS — L97514 Non-pressure chronic ulcer of other part of right foot with necrosis of bone: Secondary | ICD-10-CM | POA: Diagnosis not present

## 2020-02-22 LAB — GLUCOSE, CAPILLARY
Glucose-Capillary: 198 mg/dL — ABNORMAL HIGH (ref 70–99)
Glucose-Capillary: 199 mg/dL — ABNORMAL HIGH (ref 70–99)

## 2020-02-22 NOTE — Progress Notes (Addendum)
Martin, [name] ([id number]) Visit Report for 02/22/2020 HBO Details Patient Name: Date of Service: Martin, [name] 02/22/2020 10:00 A M Medical Record Number: [id number] Patient Account Number: [id number] Date of Birth/Sex: Treating RN: [birth year] (74 y.o. Martin Ang, [name] Primary Care Amman Bartel: [name] Other Clinician: [name] Referring Ettel Albergo: Treating Rowen Hur/Extender: [name] in Treatment: 8 HBO Treatment Course Details Treatment Course Number: 1 Ordering Elen Acero: Worthy Keeler T Treatments Ordered: otal 40 HBO Treatment Start Date: 02/22/2020 HBO Indication: Diabetic Ulcer(s) of the Lower Extremity HBO Treatment Details Treatment Number: 1 Patient Type: Outpatient Chamber Type: Monoplace Chamber Serial #: [id number] Treatment Protocol: 2.0 ATA with 90 minutes oxygen, and no air breaks Treatment Details Compression Rate Down: 1.0 psi / minute De-Compression Rate Up: 2.0 psi / minute Air breaks and breathing Decompress Decompress Compress Tx Pressure Begins Reached periods Begins Ends (leave unused spaces blank) Chamber Pressure (ATA 1 2 ------2 1 ) Clock Time (24 hr) 10:26 10:41 - - - - - - 12:11 12:19 Treatment Length: 113 (minutes) Treatment Segments: 4 Vital Signs Capillary Blood Glucose Reference Range: 80 - 120 mg / dl HBO Diabetic Blood Glucose Intervention Range: <131 mg/dl or >249 mg/dl Time Vitals Blood Respiratory Capillary Blood Glucose Pulse Action Type: Pulse: Temperature: Taken: Pressure: Rate: Glucose (mg/dl): Meter #: Oximetry (%) Taken: Pre 09:50 186/78 72 18 98.4 198 Post 12:21 170/88 78 16 98.2 199 Treatment Response Treatment Toleration: Well Treatment Completion Status: Treatment Completed without Adverse Event Additional Procedure Documentation Tissue Sevierity: Necrosis of bone Yolani Vo Notes No concerns with first treatment. Physical exam tympanic membranes normal. Respiratory  and cardiac exams clear. Physician HBO Attestation: I certify that I supervised this HBO treatment in accordance with Medicare guidelines. A trained emergency response team is readily available per Yes hospital policies and procedures. Continue HBOT as ordered. Yes Electronic Signature(s) Signed: 02/22/2020 4:47:47 PM By: Linton Ham MD Previous Signature: 02/22/2020 3:37:46 PM Version By: [name] EMT/HBOT/SD Previous Signature: 02/22/2020 3:37:46 PM Version By: [name] EMT/HBOT/SD Entered By: Linton Ham on 02/22/2020 16:06:39 -------------------------------------------------------------------------------- HBO Safety Checklist Details Patient Name: Date of Service: Martin [name]. 02/22/2020 10:00 A M Medical Record Number: [id number] Patient Account Number: [id number] Date of Birth/Sex: Treating RN: 28-Oct-1945 (74 y.o. Martin Ang, [name] Primary Care Matti Minney: [name] Other Clinician: [name] Referring Caid Radin: Treating Suman Trivedi/Extender: [name] in Treatment: 8 HBO Safety Checklist Items Safety Checklist Consent Form Signed Patient voided / foley secured and emptied When did you last eato n/a Last dose of injectable or oral agent insulin Ostomy pouch emptied and vented if applicable NA All implantable devices assessed, documented and approved NA Intravenous access site secured and place NA Valuables secured Linens and cotton and cotton/polyester blend (less than 51% polyester) Personal oil-based products / skin lotions / body lotions removed Wigs or hairpieces removed NA Smoking or tobacco materials removed NA Books / newspapers / magazines / loose paper removed Cologne, aftershave, perfume and deodorant removed Jewelry removed (may wrap wedding band) Make-up removed NA Hair care products removed Battery operated devices (external) removed NA Heating patches and chemical warmers  removed NA Titanium eyewear removed NA Nail polish cured greater than 10 hours NA Casting material cured greater than 10 hours Hearing aids removed NA Loose dentures or partials removed NA Prosthetics have been removed NA Patient demonstrates correct use of air break device (if applicable) Patient concerns have been addressed Patient grounding bracelet on and cord attached to chamber Specifics for  Inpatients (complete in addition to above) Medication sheet sent with patient Intravenous medications needed or due during therapy sent with patient Drainage tubes (e.g. nasogastric tube or chest tube secured and vented) Endotracheal or Tracheotomy tube secured Cuff deflated of air and inflated with saline Airway suctioned Electronic Signature(s) Signed: 02/22/2020 10:49:03 AM By: [name] EMT/HBOT/SD Entered By: [name] on 02/22/2020 10:49:02

## 2020-02-22 NOTE — Progress Notes (Signed)
AYAN, HEFFINGTON ([id number]) Visit Report for 02/22/2020 SuperBill Details Patient Name: Date of Service: Martin [name], Martin [name] 02/22/2020 Medical Record Number: [id number] Patient Account Number: [id number] Date of Birth/Sex: Treating RN: 06-01-1945 (74 y.o. 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

## 2020-02-22 NOTE — Progress Notes (Signed)
Martin, [name] ([id number]) Visit Report for 02/22/2020 Arrival Information Details Patient Name: Date of Service: Martin [name], SOM 02/22/2020 10:00 A M Medical Record Number: [id number] Patient Account Number: [id number] Date of Birth/Sex: Treating RN: 05-21-1945 (74 y.o. Martin [name], [name] Primary Care Cassia Fein: [name] Other Clinician: [name] Referring Satina Jerrell: Treating Amily Depp/Extender: [name] in Treatment: 8 Visit Information History Since Last Visit Added or deleted any medications: No Patient Arrived: [name] Any new allergies or adverse reactions: No Arrival Time: 09:45 Had a fall or experienced change in No Accompanied By: self activities of daily living that may affect Transfer Assistance: None risk of falls: Patient Identification Verified: Yes Signs or symptoms of abuse/neglect since last visito No Secondary Verification Process Completed: Yes Hospitalized since last visit: No Patient Requires Transmission-Based Precautions: No Implantable device outside of the clinic excluding No Patient Has Alerts: No cellular tissue based products placed in the center since last visit: Pain Present Now: No Electronic Signature(s) Signed: 02/22/2020 3:37:46 PM By: [name] EMT/HBOT/SD Entered By: [name] on 02/22/2020 10:47:55 -------------------------------------------------------------------------------- Encounter Discharge Information Details Patient Name: Date of Service: Martin [name] Agee. 02/22/2020 10:00 A M Medical Record Number: [id number] Patient Account Number: [id number] Date of Birth/Sex: Treating RN: [birth year] (74 y.o. Martin [name] Primary Care Iva Posten: [name] Other Clinician: [name] Referring Aricka Goldberger: Treating Dilyn Smiles/Extender: [name] in Treatment: 8 Encounter Discharge Information Items Discharge Condition: Stable Ambulatory Status:  Cane Discharge Destination: Home Transportation: Private Auto Accompanied By: self Schedule Follow-up Appointment: Yes Clinical Summary of Care: Patient Declined Electronic Signature(s) Signed: 02/22/2020 3:37:46 PM By: [name] EMT/HBOT/SD Entered By: [name] on 02/22/2020 14:55:42 -------------------------------------------------------------------------------- Patient/Caregiver Education Details Patient Name: Date of Service: Martin, [name] 10/26/2021andnbsp10:00 A M Medical Record Number: [id number] Patient Account Number: [id number] Date of Birth/Gender: Treating RN: Sep 04, 1945 (74 y.o. Martin [name] Primary Care Physician: [name] Other Clinician: [name] Referring Physician: Treating Physician/Extender: [name] in Treatment: 8 Education Assessment Education Provided To: Patient Education Topics Provided Hyperbaric Oxygenation: Methods: Explain/Verbal Responses: State content correctly Electronic Signature(s) Signed: 02/22/2020 3:37:46 PM By: [name] EMT/HBOT/SD Entered By: [name] on 02/22/2020 14:55:26 -------------------------------------------------------------------------------- Vitals Details Patient Name: Date of Service: Martin [name] Agee. 02/22/2020 10:00 A M Medical Record Number: [id number] Patient Account Number: [id number] Date of Birth/Sex: Treating RN: Sep 02, 1945 (74 y.o. Martin [name], [name] Primary Care Dyan Creelman: [name] Other Clinician: [name] Referring Bertram Haddix: Treating Justus Droke/Extender: [name] in Treatment: 8 Vital Signs Time Taken: 09:50 Temperature (F): 98.4 Height (in): 72 Pulse (bpm): 72 Weight (lbs): 270 Respiratory Rate (breaths/min): 18 Body Mass Index (BMI): 36.6 Blood Pressure (mmHg): 186/78 Capillary Blood Glucose (mg/dl): 198 Reference Range: 80 - 120 mg / dl Electronic Signature(s) Signed: 02/22/2020  3:37:46 PM By: [name] EMT/HBOT/SD Entered By: [name] on 02/22/2020 10:48:16

## 2020-02-23 ENCOUNTER — Other Ambulatory Visit: Payer: Self-pay

## 2020-02-23 ENCOUNTER — Encounter (HOSPITAL_BASED_OUTPATIENT_CLINIC_OR_DEPARTMENT_OTHER): Payer: PPO | Admitting: Physician Assistant

## 2020-02-23 DIAGNOSIS — L97522 Non-pressure chronic ulcer of other part of left foot with fat layer exposed: Secondary | ICD-10-CM | POA: Diagnosis not present

## 2020-02-23 DIAGNOSIS — L97514 Non-pressure chronic ulcer of other part of right foot with necrosis of bone: Secondary | ICD-10-CM | POA: Diagnosis not present

## 2020-02-23 DIAGNOSIS — E11621 Type 2 diabetes mellitus with foot ulcer: Secondary | ICD-10-CM | POA: Diagnosis not present

## 2020-02-23 LAB — GLUCOSE, CAPILLARY
Glucose-Capillary: 161 mg/dL — ABNORMAL HIGH (ref 70–99)
Glucose-Capillary: 154 mg/dL — ABNORMAL HIGH (ref 70–99)

## 2020-02-23 NOTE — Progress Notes (Addendum)
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 Primary Care Provider: [name] Other Clinician: Referring Provider: Treating Provider/Extender: Sandria Bales in Treatment: 9 T Contact Cast Applied for  Wound Assessment: otal Wound #5 Left Metatarsal head fifth Performed By: Physician Worthy Keeler, PA Post Procedure Diagnosis Same as Pre-procedure Electronic Signature(s) Signed: 02/23/2020 4:47:33 PM By: Worthy Keeler PA-C Signed: 02/23/2020 5:36:04 PM By: Baruch Gouty RN, BSN Entered By: Baruch Gouty on 02/23/2020 10:02:55 -------------------------------------------------------------------------------- SuperBill Details Patient Name: Date of Service: Martin [name], Martin [name] 02/23/2020 Medical Record Number: [id number] Patient Account Number: [id number] Date of Birth/Sex: Treating RN: 1945-05-02 (74 y.o. 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, left ankle and foot Facility Procedures CPT4 Code: [id number] Description: [phone] - APPLY TOTAL CONTACT LEG CAST ICD-10 Diagnosis Description L97.522 Non-pressure chronic ulcer of other part of left foot with fat layer exposed Modifier: Quantity: 1 Physician Procedures : CPT4 Code Description Modifier [id number] - WC PHYS LEVEL 4 - EST PT 25 ICD-10 Diagnosis Description E11.621 Type 2 diabetes mellitus with foot ulcer L97.522 Non-pressure chronic ulcer of other part of left foot with fat layer exposed I10 Essential  (primary) hypertension I25.10 Atherosclerotic heart disease of native coronary artery without angina pectoris Quantity: 1 : [id number] 29445 - WC PHYS APPLY TOTAL CONTACT CAST ICD-10 Diagnosis Description L97.522 Non-pressure chronic ulcer of other part of  left foot with fat layer exposed Quantity: 1 Electronic Signature(s) Signed: 02/23/2020 10:13:03 AM By: Worthy Keeler PA-C Entered By: Worthy Keeler on 02/23/2020 10:13:02

## 2020-02-23 NOTE — Progress Notes (Addendum)
Martin [name], Martin [name] ([id number]) Visit Report for 02/23/2020 HBO Details Patient Name: Date of Service: Martin [name], Martin [name] 02/23/2020 10:00 A M Medical Record Number: [id number] Patient Account Number: [id number] Date of Birth/Sex: Treating RN: 12-04-1945 (74 y.o. [name] Primary Care Tacarra Justo: [name] Other Clinician: [name] Referring Clydene Burack: Treating Shamere Campas/Extender: Sandria Bales in Treatment: 9 HBO Treatment Course Details Treatment Course Number: 1 Ordering Dan Scearce: Worthy Keeler T Treatments Ordered: otal 40 HBO Treatment Start Date: 02/22/2020 HBO Indication: Diabetic Ulcer(s) of the Lower Extremity HBO Treatment Details Treatment Number: 2 Patient Type: Outpatient Chamber Type: Monoplace Chamber Serial #: [id number] Treatment Protocol: 2.0 ATA with 90 minutes oxygen, and no air breaks Treatment Details Compression Rate Down: 1.5 psi / minute De-Compression Rate Up: 2.0 psi / minute Air breaks and breathing Decompress Decompress Compress Tx Pressure Begins Reached periods Begins Ends (leave unused spaces blank) Chamber Pressure (ATA 1 2 ------2 1 ) Clock Time (24 hr) 10:39 10:49 - - - - - - 12:19 12:27 Treatment Length: 108 (minutes) Treatment Segments: 4 Vital Signs Capillary Blood Glucose Reference Range: 80 - 120 mg / dl HBO Diabetic Blood Glucose Intervention Range: <131 mg/dl or >249 mg/dl Time Vitals Blood Respiratory Capillary Blood Glucose Pulse Action Type: Pulse: Temperature: Taken: Pressure: Rate: Glucose (mg/dl): Meter #: Oximetry (%) Taken: Pre 10:25 165/83 77 18 98 161 Post 12:30 163/81 70 15 98.6 154 Treatment Response Treatment Toleration: Well Treatment Completion Status: Treatment Completed without Adverse Event Additional Procedure Documentation Tissue Sevierity: Necrosis of bone Electronic Signature(s) Signed: 02/23/2020 4:20:03 PM By: [name] EMT/HBOT/SD Signed: 02/23/2020  4:47:33 PM By: Worthy Keeler PA-C Entered By: [name] on 02/23/2020 13:33:41 -------------------------------------------------------------------------------- HBO Safety Checklist Details Patient Name: Date of Service: Martin [name]. 02/23/2020 10:00 A M Medical Record Number: [id number] Patient Account Number: [id number] Date of Birth/Sex: Treating RN: October 25, 1945 (74 y.o. [name] Primary Care Nikolis Berent: [name] Other Clinician: [name] Referring Colburn Asper: Treating Jaydan Meidinger/Extender: Sandria Bales in Treatment: 9 HBO Safety Checklist Items Safety Checklist Consent Form Signed Patient voided / foley secured and emptied When did you last eato n/a Last dose of injectable or oral agent insulin Ostomy pouch emptied and vented if applicable NA All implantable devices assessed, documented and approved NA Intravenous access site secured and place NA Valuables secured Linens and cotton and cotton/polyester blend (less than 51% polyester) Personal oil-based products / skin lotions / body lotions removed Wigs or hairpieces removed NA Smoking or tobacco materials removed NA Books / newspapers / magazines / loose paper removed Cologne, aftershave, perfume and deodorant removed Jewelry removed (may wrap wedding band) Make-up removed NA Hair care products removed Battery operated devices (external) removed NA Heating patches and chemical warmers removed NA Titanium eyewear removed NA Nail polish cured greater than 10 hours NA Casting material cured greater than 10 hours NA Hearing aids removed NA Loose dentures or partials removed NA Prosthetics have been removed NA Patient demonstrates correct use of air break device (if applicable) Patient concerns have been addressed Patient grounding bracelet on and cord attached to chamber Specifics for Inpatients (complete in addition to above) Medication sheet sent with  patient Intravenous medications needed or due during therapy sent with patient Drainage tubes (e.g. nasogastric tube or chest tube secured and vented) Endotracheal or Tracheotomy tube secured Cuff deflated of air and inflated with saline Airway suctioned Electronic Signature(s) Signed: 02/23/2020 10:42:51 AM By: [name] EMT/HBOT/SD Entered By: Ronnald Ramp,  Dedrick on 02/23/2020 10:42:50

## 2020-02-23 NOTE — Progress Notes (Signed)
SHEA, KAPUR ([id number]) Visit Report for 02/23/2020 SuperBill Details Patient Name: Date of Service: Martin [name], Martin [name] 02/23/2020 Medical Record Number: [id number] Patient Account Number: [id number] Date of Birth/Sex: Treating RN: 1945-09-02 (74 y.o. 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

## 2020-02-23 NOTE — Progress Notes (Signed)
Martin [name] ([id number]) Visit Report for 02/23/2020 Arrival Information Details Patient Name: Date of Service: Martin [name], Martin [name] 02/23/2020 10:00 A M Medical Record Number: [id number] Patient Account Number: [id number] Date of Birth/Sex: Treating RN: 02/12/1946 (74 y.o. Martin [name], Martin [name] Primary Care Kendric Sindelar: [name] Other Clinician: [name] Referring Luciel Brickman: Treating Hildred Pharo/Extender: Sandria Bales in Treatment: 9 Visit Information History Since Last Visit Added or deleted any medications: No Patient Arrived: Martin [name] Any new allergies or adverse reactions: No Arrival Time: 10:20 Had a fall or experienced change in No Accompanied By: self activities of daily living that may affect Transfer Assistance: None risk of falls: Patient Identification Verified: Yes Signs or symptoms of abuse/neglect since last visito No Secondary Verification Process Completed: Yes Hospitalized since last visit: No Patient Requires Transmission-Based Precautions: No Implantable device outside of the clinic excluding No Patient Has Alerts: No cellular tissue based products placed in the center since last visit: Pain Present Now: No Electronic Signature(s) Signed: 02/23/2020 4:20:03 PM By: [name] EMT/HBOT/SD Entered By: [name] on 02/23/2020 10:41:56 -------------------------------------------------------------------------------- Encounter Discharge Information Details Patient Name: Date of Service: Martin Agee. 02/23/2020 10:00 A M Medical Record Number: [id number] Patient Account Number: [id number] Date of Birth/Sex: Treating RN: [birth year] (74 y.o. Martin [name] Primary Care Lilianne Delair: [name] Other Clinician: [name] Referring Treniyah Lynn: Treating Mansoor Hillyard/Extender: Sandria Bales in Treatment: 9 Encounter Discharge Information Items Discharge Condition: Stable Ambulatory Status:  Cane Discharge Destination: Home Transportation: Private Auto Accompanied By: self Schedule Follow-up Appointment: Yes Clinical Summary of Care: Patient Declined Electronic Signature(s) Signed: 02/23/2020 4:20:03 PM By: [name] EMT/HBOT/SD Entered By: [name] on 02/23/2020 13:34:16 -------------------------------------------------------------------------------- Patient/Caregiver Education Details Patient Name: Date of Service: [name], [name] 10/27/2021andnbsp10:00 A M Medical Record Number: [id number] Patient Account Number: [id number] Date of Birth/Gender: Treating RN: 1946/03/26 (74 y.o. Martin [name] Primary Care Physician: [name] Other Clinician: [name] Referring Physician: Treating Physician/Extender: Sandria Bales in Treatment: 9 Education Assessment Education Provided To: Patient Education Topics Provided Hyperbaric Oxygenation: Methods: Explain/Verbal Responses: State content correctly Electronic Signature(s) Signed: 02/23/2020 4:20:03 PM By: [name] EMT/HBOT/SD Entered By: [name] on 02/23/2020 13:34:00 -------------------------------------------------------------------------------- Vitals Details Patient Name: Date of Service: Martin Agee. 02/23/2020 10:00 A M Medical Record Number: [id number] Patient Account Number: [id number] Date of Birth/Sex: Treating RN: [birth year] (74 y.o. Martin [name] Primary Care Torryn Fiske: [name] Other Clinician: [name] Referring Melquiades Kovar: Treating Litsy Epting/Extender: Sandria Bales in Treatment: 9 Vital Signs Time Taken: 10:25 Temperature (F): 98 Height (in): 72 Pulse (bpm): 77 Weight (lbs): 270 Respiratory Rate (breaths/min): 18 Body Mass Index (BMI): 36.6 Blood Pressure (mmHg): 165/83 Capillary Blood Glucose (mg/dl): 161 Reference Range: 80 - 120 mg / dl Electronic Signature(s) Signed: 02/23/2020  4:20:03 PM By: [name] EMT/HBOT/SD Entered By: [name] on 02/23/2020 10:42:12

## 2020-02-24 ENCOUNTER — Telehealth: Payer: Self-pay | Admitting: Family Medicine

## 2020-02-24 ENCOUNTER — Other Ambulatory Visit: Payer: Self-pay

## 2020-02-24 ENCOUNTER — Encounter (HOSPITAL_BASED_OUTPATIENT_CLINIC_OR_DEPARTMENT_OTHER): Payer: PPO | Admitting: Internal Medicine

## 2020-02-24 DIAGNOSIS — E11621 Type 2 diabetes mellitus with foot ulcer: Secondary | ICD-10-CM | POA: Diagnosis not present

## 2020-02-24 DIAGNOSIS — L97514 Non-pressure chronic ulcer of other part of right foot with necrosis of bone: Secondary | ICD-10-CM | POA: Diagnosis not present

## 2020-02-24 LAB — GLUCOSE, CAPILLARY
Glucose-Capillary: 171 mg/dL — ABNORMAL HIGH (ref 70–99)
Glucose-Capillary: 158 mg/dL — ABNORMAL HIGH (ref 70–99)

## 2020-02-24 NOTE — Progress Notes (Addendum)
SANG, BLOUNT ([id number]) Visit Report for 02/24/2020 HBO Details Patient Name: Date of Service: Martin [name], Martin [name] 02/24/2020 10:00 A M Medical Record Number: [id number] Patient Account Number: [id number] Date of Birth/Sex: Treating RN: 02-May-1945 (73 y.o. [name], [name] Primary Care Dvaughn Fickle: [name] Other Clinician: [name] Referring Delman Goshorn: Treating [place]/Extender: [name] in Treatment: 9 HBO Treatment Course Details Treatment Course Number: 1 Ordering Orazio Weller: Worthy Keeler T Treatments Ordered: otal 40 HBO Treatment Start Date: 02/22/2020 HBO Indication: Diabetic Ulcer(s) of the Lower Extremity HBO Treatment Details Treatment Number: 3 Patient Type: Outpatient Chamber Type: Monoplace Chamber Serial #: [id number] Treatment Protocol: 2.0 ATA with 90 minutes oxygen, and no air breaks Treatment Details Compression Rate Down: 2.0 psi / minute De-Compression Rate Up: 2.0 psi / minute Air breaks and breathing Decompress Decompress Compress Tx Pressure Begins Reached periods Begins Ends (leave unused spaces blank) Chamber Pressure (ATA 1 2 ------2 1 ) Clock Time (24 hr) 09:59 10:07 - - - - - - 11:37 11:45 Treatment Length: 106 (minutes) Treatment Segments: 4 Vital Signs Capillary Blood Glucose Reference Range: 80 - 120 mg / dl HBO Diabetic Blood Glucose Intervention Range: <131 mg/dl or >249 mg/dl Time Vitals Blood Respiratory Capillary Blood Glucose Pulse Action Type: Pulse: Temperature: Taken: Pressure: Rate: Glucose (mg/dl): Meter #: Oximetry (%) Taken: Pre 09:45 167/97 74 19 98.2 171 Post 11:47 161/71 66 14 98.1 158 Treatment Response Treatment Toleration: Well Treatment Completion Status: Treatment Completed without Adverse Event Additional Procedure Documentation Tissue Sevierity: Necrosis of bone Aracely Rickett Notes No concerns with treatment given Physician HBO Attestation: I certify that I supervised  this HBO treatment in accordance with Medicare guidelines. A trained emergency response team is readily available per Yes hospital policies and procedures. Continue HBOT as ordered. Yes Electronic Signature(s) Signed: 02/24/2020 5:01:15 PM By: Linton Ham MD Previous Signature: 02/24/2020 3:27:55 PM Version By: [name] EMT/HBOT/SD Previous Signature: 02/24/2020 3:27:55 PM Version By: [name] EMT/HBOT/SD Entered By: Linton Ham on 02/24/2020 15:29:14 -------------------------------------------------------------------------------- HBO Safety Checklist Details Patient Name: Date of Service: [name] 02/24/2020 10:00 A M Medical Record Number: [id number] Patient Account Number: [id number] Date of Birth/Sex: Treating RN: 1946/01/08 (74 y.o. [name], [name] Primary Care Shiro Ellerman: [name] Other Clinician: [name] Referring Avanni Turnbaugh: Treating Leatta Alewine/Extender: [name] in Treatment: 9 HBO Safety Checklist Items Safety Checklist Consent Form Signed Patient voided / foley secured and emptied When did you last eato n/a Last dose of injectable or oral agent insulin Ostomy pouch emptied and vented if applicable NA All implantable devices assessed, documented and approved NA Intravenous access site secured and place NA Valuables secured Linens and cotton and cotton/polyester blend (less than 51% polyester) Personal oil-based products / skin lotions / body lotions removed Wigs or hairpieces removed NA Smoking or tobacco materials removed NA Books / newspapers / magazines / loose paper removed Cologne, aftershave, perfume and deodorant removed Jewelry removed (may wrap wedding band) Make-up removed NA Hair care products removed Battery operated devices (external) removed NA Heating patches and chemical warmers removed NA Titanium eyewear removed NA Nail polish cured greater than 10 hours NA Casting  material cured greater than 10 hours Hearing aids removed NA Loose dentures or partials removed NA Prosthetics have been removed NA Patient demonstrates correct use of air break device (if applicable) Patient concerns have been addressed Patient grounding bracelet on and cord attached to chamber Specifics for Inpatients (complete in addition to above) Medication sheet sent with  patient Intravenous medications needed or due during therapy sent with patient Drainage tubes (e.g. nasogastric tube or chest tube secured and vented) Endotracheal or Tracheotomy tube secured Cuff deflated of air and inflated with saline Airway suctioned Electronic Signature(s) Signed: 02/24/2020 10:43:32 AM By: [name] EMT/HBOT/SD Entered By: [name] on 02/24/2020 10:43:31

## 2020-02-24 NOTE — Progress Notes (Signed)
Martin, [name] ([id number]) Visit Report for 02/24/2020 Arrival Information Details Patient Name: Date of Service: Martin [name], Martin [name] 02/24/2020 10:00 A M Medical Record Number: [id number] Patient Account Number: [id number] Date of Birth/Sex: Treating RN: [birth year] (74 y.o. Martin [name], [name] Primary Care Isrrael Fluckiger: [name] Other Clinician: [name] Referring Amity Roes: Treating Stefanee Mckell/Extender: [name] in Treatment: 9 Visit Information History Since Last Visit Added or deleted any medications: No Patient Arrived: Martin [name] Any new allergies or adverse reactions: No Arrival Time: 09:40 Had a fall or experienced change in No Accompanied By: self activities of daily living that may affect Transfer Assistance: None risk of falls: Patient Identification Verified: Yes Signs or symptoms of abuse/neglect since last visito No Secondary Verification Process Completed: Yes Hospitalized since last visit: No Patient Requires Transmission-Based Precautions: No Implantable device outside of the clinic excluding No Patient Has Alerts: No cellular tissue based products placed in the center since last visit: Pain Present Now: No Electronic Signature(s) Signed: 02/24/2020 3:27:55 PM By: [name] EMT/HBOT/SD Entered By: [name] on 02/24/2020 10:42:33 -------------------------------------------------------------------------------- Encounter Discharge Information Details Patient Name: Date of Service: Martin [name]. 02/24/2020 10:00 A M Medical Record Number: [id number] Patient Account Number: [id number] Date of Birth/Sex: Treating RN: January 17, 1946 (74 y.o. Martin [name] Primary Care Deshanae Lindo: [name] Other Clinician: [name] Referring Dijuan Sleeth: Treating Niah Heinle/Extender: [name] in Treatment: 9 Encounter Discharge Information Items Discharge Condition: Stable Ambulatory Status:  Cane Discharge Destination: Home Transportation: Private Auto Accompanied By: self Schedule Follow-up Appointment: Yes Clinical Summary of Care: Patient Declined Electronic Signature(s) Signed: 02/24/2020 3:27:55 PM By: [name] EMT/HBOT/SD Entered By: [name] on 02/24/2020 13:25:37 -------------------------------------------------------------------------------- Patient/Caregiver Education Details Patient Name: Date of Service: [name], [name] 10/28/2021andnbsp10:00 A M Medical Record Number: [id number] Patient Account Number: [id number] Date of Birth/Gender: Treating RN: [birth year] (74 y.o. Martin [name] Primary Care Physician: [name] Other Clinician: [name] Referring Physician: Treating Physician/Extender: [name] in Treatment: 9 Education Assessment Education Provided To: Patient Education Topics Provided Hyperbaric Oxygenation: Methods: Explain/Verbal Responses: State content correctly Electronic Signature(s) Signed: 02/24/2020 3:27:55 PM By: [name] EMT/HBOT/SD Entered By: [name] on 02/24/2020 13:25:20 -------------------------------------------------------------------------------- Vitals Details Patient Name: Date of Service: Martin [name]. 02/24/2020 10:00 A M Medical Record Number: [id number] Patient Account Number: [id number] Date of Birth/Sex: Treating RN: [birth year] (74 y.o. Martin [name], [name] Primary Care Pier Bosher: [name] Other Clinician: [name] Referring Antwuan Eckley: Treating Jarius Dieudonne/Extender: [name] in Treatment: 9 Vital Signs Time Taken: 09:45 Temperature (F): 98.2 Height (in): 72 Pulse (bpm): 74 Weight (lbs): 270 Respiratory Rate (breaths/min): 19 Body Mass Index (BMI): 36.6 Blood Pressure (mmHg): 167/97 Capillary Blood Glucose (mg/dl): 171 Reference Range: 80 - 120 mg / dl Electronic Signature(s) Signed: 02/24/2020  3:27:55 PM By: [name] EMT/HBOT/SD Entered By: [name] on 02/24/2020 10:42:48

## 2020-02-24 NOTE — Progress Notes (Signed)
MAXMILLIAN, CARSEY ([id number]) Visit Report for 02/23/2020 Arrival Information Details Patient Name: Date of Service: TYION, BOYLEN 02/23/2020 9:00 A M Medical Record Number: [id number] Patient Account Number: [id number] Date of Birth/Sex: Treating RN: July 29, 1945 (74 y.o. [name] Primary Care Zian Delair: [name] Other Clinician: Referring Emrik Erhard: Treating Adore Kithcart/Extender: Sandria Bales in Treatment: 9 Visit Information History Since Last Visit Added or deleted any medications: No Patient Arrived: [name] Any new allergies or adverse reactions: No Arrival Time: 08:53 Had a fall or experienced change in No Accompanied By: alone activities of daily living that may affect Transfer Assistance: None risk of falls: Patient Identification Verified: Yes Signs or symptoms of abuse/neglect since last visito No Secondary Verification Process Completed: Yes Hospitalized since last visit: No Patient Requires Transmission-Based Precautions: No Implantable device outside of the clinic excluding No Patient Has Alerts: No cellular tissue based products placed in the center since last visit: Has Dressing in Place as Prescribed: Yes Has Footwear/Offloading in Place as Prescribed: Yes Left: T Contact Cast otal Pain Present Now: No Electronic Signature(s) Signed: 02/23/2020 5:38:30 PM By: [name] RN, BSN Entered By: [name] on 02/23/2020 08:54:24 -------------------------------------------------------------------------------- Encounter Discharge Information Details Patient Name: Date of Service: [name] 02/23/2020 9:00 A M Medical Record Number: [id number] Patient Account Number: [id number] Date of Birth/Sex: Treating RN: 09/18/1945 (74 y.o. [name] Primary Care Marcel Sorter: [name] Other Clinician: Referring Karima Carrell: Treating Hermena Swint/Extender: Sandria Bales in Treatment: 9 Encounter  Discharge Information Items Discharge Condition: Stable Ambulatory Status: Cane Discharge Destination: Home Transportation: Private Auto Accompanied By: self Schedule Follow-up Appointment: Yes Clinical Summary of Care: Electronic Signature(s) Signed: 02/24/2020 5:24:43 PM By: Deon Pilling Entered By: Deon Pilling on 02/23/2020 12:37:51 -------------------------------------------------------------------------------- Lower Extremity Assessment Details Patient Name: Date of Service: NATAN, HARTOG 02/23/2020 9:00 A M Medical Record Number: [id number] Patient Account Number: [id number] Date of Birth/Sex: Treating RN: Aug 28, 1945 (74 y.o. [name] Primary Care Anna Beaird: [name] Other Clinician: Referring Nastacia Raybuck: Treating Selwyn Reason/Extender: Sandria Bales in Treatment: 9 Edema Assessment Assessed: [name]: No] [name]: No] Edema: [Left: Ye] [Right: s] Calf Left: Right: Point of Measurement: From Medial Instep 39 cm Ankle Left: Right: Point of Measurement: From Medial Instep 26 cm Vascular Assessment Pulses: Dorsalis Pedis Palpable: [Left:Yes] Electronic Signature(s) Signed: 02/23/2020 5:38:30 PM By: [name] RN, BSN Entered By: [name] on 02/23/2020 09:04:24 -------------------------------------------------------------------------------- [place] Details Patient Name: Date of Service: [name] 02/23/2020 9:00 A M Medical Record Number: [id number] Patient Account Number: [id number] Date of Birth/Sex: Treating RN: 1946-01-20 (74 y.o. [name] Primary Care Jaine Estabrooks: [name] Other Clinician: Referring Louden Houseworth: Treating Stormey Wilborn/Extender: Sandria Bales in Treatment: 9 Active Inactive Nutrition Nursing Diagnoses: Impaired glucose control: actual or potential Potential for alteratiion in Nutrition/Potential for imbalanced  nutrition Goals: Patient/caregiver will maintain therapeutic glucose control Date Initiated: 12/22/2019 Target Resolution Date: 03/22/2020 Goal Status: Active Interventions: Assess HgA1c results as ordered upon admission and as needed Treatment Activities: Patient referred to Primary Care Physician for further nutritional evaluation : 12/22/2019 Notes: Wound/Skin Impairment Nursing Diagnoses: Impaired tissue integrity Knowledge deficit related to ulceration/compromised skin integrity Goals: Patient/caregiver will verbalize understanding of skin care regimen Date Initiated: 12/22/2019 Target Resolution Date: 03/15/2020 Goal Status: Active Ulcer/skin breakdown will have a volume reduction of 30% by week 4 Date Initiated: 12/22/2019 Date Inactivated: 01/19/2020 Target Resolution Date: 01/19/2020 Goal Status: Unmet Unmet Reason: infection Ulcer/skin breakdown  will have a volume reduction of 50% by week 8 Date Initiated: 01/19/2020 Date Inactivated: 02/16/2020 Target Resolution Date: 02/16/2020 Goal Status: Unmet Unmet Reason: infection Ulcer/skin breakdown will have a volume reduction of 80% by week 12 Date Initiated: 02/16/2020 Target Resolution Date: 03/15/2020 Goal Status: Active Interventions: Assess patient/caregiver ability to obtain necessary supplies Assess patient/caregiver ability to perform ulcer/skin care regimen upon admission and as needed Assess ulceration(s) every visit Provide education on ulcer and skin care Treatment Activities: Skin care regimen initiated : 12/22/2019 Topical wound management initiated : 12/22/2019 Notes: Electronic Signature(s) Signed: 02/23/2020 5:36:04 PM By: Baruch Gouty RN, BSN Entered By: Baruch Gouty on 02/23/2020 10:00:19 -------------------------------------------------------------------------------- Pain Assessment Details Patient Name: Date of Service: [name] 02/23/2020 9:00 A M Medical Record Number:  [id number] Patient Account Number: [id number] Date of Birth/Sex: Treating RN: 02-04-1946 (74 y.o. [name] Primary Care Mayanna Garlitz: [name] Other Clinician: Referring Charday Capetillo: Treating Ihor Meinzer/Extender: Sandria Bales in Treatment: 9 Active Problems Location of Pain Severity and Description of Pain Patient Has Paino No Site Locations Pain Management and Medication Current Pain Management: Electronic Signature(s) Signed: 02/23/2020 5:38:30 PM By: [name] RN, BSN Entered By: [name] on 02/23/2020 08:54:35 -------------------------------------------------------------------------------- Patient/Caregiver Education Details Patient Name: Date of Service: [name] 10/27/2021andnbsp9:00 A M Medical Record Number: [id number] Patient Account Number: [id number] Date of Birth/Gender: Treating RN: 1945-11-29 (74 y.o. [name] Primary Care Physician: [name] Other Clinician: Referring Physician: Treating Physician/Extender: Sandria Bales in Treatment: 9 Education Assessment Education Provided To: Patient Education Topics Provided Hyperbaric Oxygenation: Methods: Explain/Verbal Responses: Reinforcements needed, State content correctly Offloading: Methods: Explain/Verbal Responses: Reinforcements needed, State content correctly Electronic Signature(s) Signed: 02/23/2020 5:36:04 PM By: Baruch Gouty RN, BSN Entered By: Baruch Gouty on 02/23/2020 10:00:59 -------------------------------------------------------------------------------- Wound Assessment Details Patient Name: Date of Service: [name] 02/23/2020 9:00 A M Medical Record Number: [id number] Patient Account Number: [id number] Date of Birth/Sex: Treating RN: Apr 17, 1946 (74 y.o. [name] Primary Care [place]: [name] Other Clinician: Referring Micajah Dennin: Treating Marquisa Salih/Extender: Sandria Bales in Treatment: 9 Wound Status Wound Number: 5 Primary Diabetic Wound/Ulcer of the Lower Extremity Etiology: Wound Location: Left Metatarsal head fifth Wound Open Wounding Event: Gradually Appeared Status: Date Acquired: 10/31/2019 Comorbid Cataracts, Coronary Artery Disease, Hypertension, Type II Weeks Of Treatment: 9 History: Diabetes, Neuropathy Clustered Wound: No Wound Measurements Length: (cm) 0.6 Width: (cm) 0.6 Depth: (cm) 0.3 Area: (cm) 0.283 Volume: (cm) 0.085 % Reduction in Area: 76.6% % Reduction in Volume: 90% Epithelialization: Medium (34-66%) Tunneling: No Undermining: No Wound Description Classification: Grade 3 Wound Margin: Well defined, not attached Exudate Amount: Medium Exudate Type: Serous Exudate Color: amber Foul Odor After Cleansing: No Slough/Fibrino Yes Wound Bed Granulation Amount: Medium (34-66%) Exposed Structure Granulation Quality: Pink, Pale Fascia Exposed: No Necrotic Amount: Medium (34-66%) Fat Layer (Subcutaneous Tissue) Exposed: Yes Necrotic Quality: Adherent Slough Tendon Exposed: No Muscle Exposed: No Joint Exposed: No Bone Exposed: No Treatment Notes Wound #5 (Left Metatarsal head fifth) 1. Cleanse With Wound Cleanser Soap and water 2. Periwound Care Skin Prep 3. Primary Dressing Applied Collegen AG Hydrogel or K-Y Jelly 4. Secondary Dressing Dry Gauze Foam Drawtex 5. Secured With Medipore tape 7. Footwear/Offloading device applied T Contact Cast otal Notes foam donut. extra padding to left foot. last layer of TCC applied by PA. Electronic Signature(s) Signed: 02/23/2020 5:38:30 PM By: [name] RN, BSN Entered By: [name] on 02/23/2020 09:05:13 --------------------------------------------------------------------------------  Vitals Details Patient Name: Date of Service: MODESTO, GANOE 02/23/2020 9:00 A M Medical Record Number: [id number] Patient Account Number:  [id number] Date of Birth/Sex: Treating RN: 07-02-1945 (74 y.o. [name] Primary Care Jazzmyne Rasnick: [name] Other Clinician: Referring Garreth Burnsworth: Treating Amay Mijangos/Extender: Sandria Bales in Treatment: 9 Vital Signs Time Taken: 09:00 Temperature (F): 98.0 Height (in): 72 Pulse (bpm): 77 Weight (lbs): 270 Respiratory Rate (breaths/min): 18 Body Mass Index (BMI): 36.6 Blood Pressure (mmHg): 165/83 Capillary Blood Glucose (mg/dl): 161 Reference Range: 80 - 120 mg / dl Notes glucose per pt report Electronic Signature(s) Signed: 02/23/2020 5:38:30 PM By: [name] RN, BSN Entered By: [name] on 02/23/2020 09:01:32

## 2020-02-24 NOTE — Progress Notes (Signed)
CHARON, AKAMINE ([id number]) Visit Report for 02/24/2020 SuperBill Details Patient Name: Date of Service: TRASK, VOSLER 02/24/2020 Medical Record Number: [id number] Patient Account Number: [id number] Date of Birth/Sex: Treating RN: 1946/02/12 (74 y.o. 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

## 2020-02-24 NOTE — Progress Notes (Signed)
  Chronic Care Management   Outreach Note  02/24/2020 Name: Martin [name] [facility] MRN: [id number] DOB: 29-Jul-1945  Referred by: Marin Olp, MD Reason for referral : No chief complaint on file.   A second unsuccessful telephone outreach was attempted today. The patient was referred to pharmacist for assistance with care management and care coordination.  Follow Up Plan:   Lauretta Grill Upstream Scheduler

## 2020-02-25 ENCOUNTER — Encounter (HOSPITAL_BASED_OUTPATIENT_CLINIC_OR_DEPARTMENT_OTHER): Payer: PPO | Admitting: Internal Medicine

## 2020-02-25 ENCOUNTER — Other Ambulatory Visit: Payer: Self-pay

## 2020-02-25 DIAGNOSIS — L97514 Non-pressure chronic ulcer of other part of right foot with necrosis of bone: Secondary | ICD-10-CM | POA: Diagnosis not present

## 2020-02-25 DIAGNOSIS — E11621 Type 2 diabetes mellitus with foot ulcer: Secondary | ICD-10-CM | POA: Diagnosis not present

## 2020-02-25 LAB — GLUCOSE, CAPILLARY
Glucose-Capillary: 172 mg/dL — ABNORMAL HIGH (ref 70–99)
Glucose-Capillary: 165 mg/dL — ABNORMAL HIGH (ref 70–99)

## 2020-02-25 NOTE — Progress Notes (Addendum)
Martin [name], Martin [name] ([id number]) Visit Report for 02/25/2020 HBO Details Patient Name: Date of Service: Martin [name], Martin [name] 02/25/2020 10:00 A M Medical Record Number: [id number] Patient Account Number: [id number] Date of Birth/Sex: Treating RN: October 15, 1945 (74 y.o. [name] Primary Care Legna Mausolf: [name] Other Clinician: [name] Referring Inocente Krach: Treating Coen Miyasato/Extender: [name] in Treatment: 9 HBO Treatment Course Details Treatment Course Number: 1 Ordering Veronica Guerrant: Worthy Keeler T Treatments Ordered: otal 40 HBO Treatment Start Date: 02/22/2020 HBO Indication: Diabetic Ulcer(s) of the Lower Extremity HBO Treatment Details Treatment Number: 4 Patient Type: Outpatient Chamber Type: Monoplace Chamber Serial #: [id number] Treatment Protocol: 2.0 ATA with 90 minutes oxygen, and no air breaks Treatment Details Compression Rate Down: 2.0 psi / minute De-Compression Rate Up: 2.0 psi / minute Air breaks and breathing Decompress Decompress Compress Tx Pressure Begins Reached periods Begins Ends (leave unused spaces blank) Chamber Pressure (ATA 1 2 ------2 1 ) Clock Time (24 hr) 10:01 10:09 - - - - - - 11:39 11:47 Treatment Length: 106 (minutes) Treatment Segments: 4 Vital Signs Capillary Blood Glucose Reference Range: 80 - 120 mg / dl HBO Diabetic Blood Glucose Intervention Range: <131 mg/dl or >249 mg/dl Time Vitals Blood Respiratory Capillary Blood Glucose Pulse Action Type: Pulse: Temperature: Taken: Pressure: Rate: Glucose (mg/dl): Meter #: Oximetry (%) Taken: Pre 09:50 152/82 76 15 98.6 172 Post 11:50 153/72 68 14 98.3 165 Treatment Response Treatment Toleration: Well Treatment Completion Status: Treatment Completed without Adverse Event Additional Procedure Documentation Tissue Sevierity: Necrosis of bone Rheana Casebolt Notes no concerns with treatment given Physician HBO Attestation: I certify that I  supervised this HBO treatment in accordance with Medicare guidelines. A trained emergency response team is readily available per Yes hospital policies and procedures. Continue HBOT as ordered. Yes Electronic Signature(s) Signed: 02/28/2020 2:10:44 PM By: Linton Ham MD Previous Signature: 02/25/2020 3:24:38 PM Version By: [name] EMT/HBOT/SD Previous Signature: 02/25/2020 3:24:38 PM Version By: [name] EMT/HBOT/SD Entered By: Linton Ham on 02/27/2020 06:21:34 -------------------------------------------------------------------------------- HBO Safety Checklist Details Patient Name: Date of Service: Martin [name]. 02/25/2020 10:00 A M Medical Record Number: [id number] Patient Account Number: [id number] Date of Birth/Sex: Treating RN: [birth year] (74 y.o. [name] Primary Care Manaia Samad: [name] Other Clinician: [name] Referring Manraj Yeo: Treating Dylynn Ketner/Extender: [name] in Treatment: 9 HBO Safety Checklist Items Safety Checklist Consent Form Signed Patient voided / foley secured and emptied When did you last eato n/a Last dose of injectable or oral agent insulin Ostomy pouch emptied and vented if applicable NA All implantable devices assessed, documented and approved NA Intravenous access site secured and place NA Valuables secured Linens and cotton and cotton/polyester blend (less than 51% polyester) Personal oil-based products / skin lotions / body lotions removed Wigs or hairpieces removed NA Smoking or tobacco materials removed NA Books / newspapers / magazines / loose paper removed Cologne, aftershave, perfume and deodorant removed Jewelry removed (may wrap wedding band) Make-up removed NA Hair care products removed Battery operated devices (external) removed NA Heating patches and chemical warmers removed NA Titanium eyewear removed NA Nail polish cured greater than 10  hours NA Casting material cured greater than 10 hours Hearing aids removed NA Loose dentures or partials removed NA Prosthetics have been removed NA Patient demonstrates correct use of air break device (if applicable) Patient concerns have been addressed Patient grounding bracelet on and cord attached to chamber Specifics for Inpatients (complete in addition to above) Medication sheet sent with  patient Intravenous medications needed or due during therapy sent with patient Drainage tubes (e.g. nasogastric tube or chest tube secured and vented) Endotracheal or Tracheotomy tube secured Cuff deflated of air and inflated with saline Airway suctioned Electronic Signature(s) Signed: 02/25/2020 10:03:54 AM By: [name] EMT/HBOT/SD Entered By: [name] on 02/25/2020 10:03:54

## 2020-02-25 NOTE — Progress Notes (Signed)
GURMAN, ASHLAND ([id number]) Visit Report for 02/25/2020 Arrival Information Details Patient Name: Date of Service: Martin [name], Martin [name] 02/25/2020 10:00 A M Medical Record Number: [id number] Patient Account Number: [id number] Date of Birth/Sex: Treating RN: August 03, 1945 (74 y.o. [name], [name] Primary Care Riel Hirschman: [name] Other Clinician: [name] Referring Keionte Swicegood: Treating Teddie Curd/Extender: [name] in Treatment: 9 Visit Information History Since Last Visit Added or deleted any medications: No Patient Arrived: [name] Any new allergies or adverse reactions: No Arrival Time: 09:45 Had a fall or experienced change in No Accompanied By: self activities of daily living that may affect Transfer Assistance: None risk of falls: Patient Identification Verified: Yes Signs or symptoms of abuse/neglect since last visito No Secondary Verification Process Completed: Yes Hospitalized since last visit: No Patient Requires Transmission-Based Precautions: No Implantable device outside of the clinic excluding No Patient Has Alerts: No cellular tissue based products placed in the center since last visit: Pain Present Now: No Electronic Signature(s) Signed: 02/25/2020 3:24:38 PM By: [name] EMT/HBOT/SD Entered By: [name] on 02/25/2020 10:02:45 -------------------------------------------------------------------------------- Encounter Discharge Information Details Patient Name: Date of Service: Martin [name]. 02/25/2020 10:00 A M Medical Record Number: [id number] Patient Account Number: [id number] Date of Birth/Sex: Treating RN: 1945-06-01 (74 y.o. [name] Primary Care [employer] Mcjunkins: [name] Other Clinician: [name] Referring Victory Strollo: Treating Jamond Neels/Extender: [name] in Treatment: 9 Encounter Discharge Information Items Discharge Condition: Stable Ambulatory Status:  Cane Discharge Destination: Home Transportation: Private Auto Accompanied By: self Schedule Follow-up Appointment: Yes Clinical Summary of Care: Patient Declined Electronic Signature(s) Signed: 02/25/2020 3:24:38 PM By: [name] EMT/HBOT/SD Entered By: [name] on 02/25/2020 11:58:07 -------------------------------------------------------------------------------- Patient/Caregiver Education Details Patient Name: Date of Service: [name], [name] 10/29/2021andnbsp10:00 A M Medical Record Number: [id number] Patient Account Number: [id number] Date of Birth/Gender: Treating RN: Jul 24, 1945 (74 y.o. [name] Primary Care Physician: [name] Other Clinician: [name] Referring Physician: Treating Physician/Extender: [name] in Treatment: 9 Education Assessment Education Provided To: Patient Education Topics Provided Hyperbaric Oxygenation: Methods: Explain/Verbal Responses: State content correctly Electronic Signature(s) Signed: 02/25/2020 3:24:38 PM By: [name] EMT/HBOT/SD Entered By: [name] on 02/25/2020 11:57:52 -------------------------------------------------------------------------------- Vitals Details Patient Name: Date of Service: Martin [name]. 02/25/2020 10:00 A M Medical Record Number: [id number] Patient Account Number: [id number] Date of Birth/Sex: Treating RN: 1945-11-28 (74 y.o. [name] Primary Care Martin [name]: [name] Other Clinician: [name] Referring Euphemia Lingerfelt: Treating Aveon Colquhoun/Extender: [name] in Treatment: 9 Vital Signs Time Taken: 09:50 Temperature (F): 98.6 Height (in): 72 Pulse (bpm): 76 Weight (lbs): 270 Respiratory Rate (breaths/min): 15 Body Mass Index (BMI): 36.6 Blood Pressure (mmHg): 152/82 Capillary Blood Glucose (mg/dl): 172 Reference Range: 80 - 120 mg / dl Electronic Signature(s) Signed: 02/25/2020  3:24:38 PM By: [name] EMT/HBOT/SD Entered By: [name] on 02/25/2020 10:03:06

## 2020-02-28 ENCOUNTER — Encounter (HOSPITAL_BASED_OUTPATIENT_CLINIC_OR_DEPARTMENT_OTHER): Payer: PPO | Admitting: Internal Medicine

## 2020-02-28 NOTE — Progress Notes (Signed)
HUTSON, LUFT ([id number]) Visit Report for 02/25/2020 SuperBill Details Patient Name: Date of Service: RIGO, LETTS 02/25/2020 Medical Record Number: [id number] Patient Account Number: [id number] Date of Birth/Sex: Treating RN: 07-29-1945 (74 y.o. 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

## 2020-02-29 ENCOUNTER — Encounter (HOSPITAL_BASED_OUTPATIENT_CLINIC_OR_DEPARTMENT_OTHER): Payer: PPO | Attending: Internal Medicine | Admitting: Internal Medicine

## 2020-02-29 ENCOUNTER — Other Ambulatory Visit: Payer: Self-pay

## 2020-02-29 DIAGNOSIS — E11621 Type 2 diabetes mellitus with foot ulcer: Secondary | ICD-10-CM | POA: Diagnosis not present

## 2020-02-29 DIAGNOSIS — Z888 Allergy status to other drugs, medicaments and biological substances status: Secondary | ICD-10-CM | POA: Diagnosis not present

## 2020-02-29 DIAGNOSIS — Z881 Allergy status to other antibiotic agents status: Secondary | ICD-10-CM | POA: Diagnosis not present

## 2020-02-29 DIAGNOSIS — M86272 Subacute osteomyelitis, left ankle and foot: Secondary | ICD-10-CM | POA: Diagnosis not present

## 2020-02-29 DIAGNOSIS — L97522 Non-pressure chronic ulcer of other part of left foot with fat layer exposed: Secondary | ICD-10-CM | POA: Insufficient documentation

## 2020-02-29 DIAGNOSIS — L97529 Non-pressure chronic ulcer of other part of left foot with unspecified severity: Secondary | ICD-10-CM | POA: Diagnosis present

## 2020-02-29 DIAGNOSIS — Z87891 Personal history of nicotine dependence: Secondary | ICD-10-CM | POA: Insufficient documentation

## 2020-02-29 DIAGNOSIS — L97514 Non-pressure chronic ulcer of other part of right foot with necrosis of bone: Secondary | ICD-10-CM | POA: Diagnosis not present

## 2020-02-29 LAB — GLUCOSE, CAPILLARY
Glucose-Capillary: 183 mg/dL — ABNORMAL HIGH (ref 70–99)
Glucose-Capillary: 171 mg/dL — ABNORMAL HIGH (ref 70–99)

## 2020-02-29 NOTE — Progress Notes (Signed)
KASPER, MUDRICK ([id number]) Visit Report for 02/29/2020 SuperBill Details Patient Name: Date of Service: Martin [name], Martin [name] 02/29/2020 Medical Record Number: [id number] Patient Account Number: [id number] Date of Birth/Sex: Treating RN: [birth year] (74 y.o. 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

## 2020-02-29 NOTE — Progress Notes (Addendum)
KEMPTON, MILNE ([id number]) Visit Report for 02/29/2020 HBO Details Patient Name: Date of Service: Martin [name], Martin [name] 02/29/2020 10:00 A M Medical Record Number: [id number] Patient Account Number: [id number] Date of Birth/Sex: Treating RN: Jan 23, 1946 (74 y.o. Martin [name]) Carlene Coria Primary Care Aleysha Meckler: [name] Other Clinician: [name] Referring Hikaru Delorenzo: Treating Shadoe Cryan/Extender: [name] in Treatment: 9 HBO Treatment Course Details Treatment Course Number: 1 Ordering Idris Edmundson: Worthy Keeler T Treatments Ordered: otal 40 HBO Treatment Start Date: 02/22/2020 HBO Indication: Diabetic Ulcer(s) of the Lower Extremity HBO Treatment Details Treatment Number: 5 Patient Type: Outpatient Chamber Type: Monoplace Chamber Serial #: [id number] Treatment Protocol: 2.0 ATA with 90 minutes oxygen, and no air breaks Treatment Details Compression Rate Down: 2.0 psi / minute De-Compression Rate Up: 2.0 psi / minute Air breaks and breathing Decompress Decompress Compress Tx Pressure Begins Reached periods Begins Ends (leave unused spaces blank) Chamber Pressure (ATA 1 2 ------2 1 ) Clock Time (24 hr) 10:03 10:11 - - - - - - 11:41 11:49 Treatment Length: 106 (minutes) Treatment Segments: 4 Vital Signs Capillary Blood Glucose Reference Range: 80 - 120 mg / dl HBO Diabetic Blood Glucose Intervention Range: <131 mg/dl or >249 mg/dl Time Vitals Blood Respiratory Capillary Blood Glucose Pulse Action Type: Pulse: Temperature: Taken: Pressure: Rate: Glucose (mg/dl): Meter #: Oximetry (%) Taken: Pre 09:35 158/78 88 15 98.6 183 Post 11:51 162/72 76 15 98.4 171 Treatment Response Treatment Toleration: Well Treatment Completion Status: Treatment Completed without Adverse Event Additional Procedure Documentation Tissue Sevierity: Necrosis of bone Hargun Spurling Notes No concerns with treatment given Physician HBO Attestation: I certify that I supervised  this HBO treatment in accordance with Medicare guidelines. A trained emergency response team is readily available per Yes hospital policies and procedures. Continue HBOT as ordered. Yes Electronic Signature(s) Signed: 02/29/2020 4:57:00 PM By: Linton Ham MD Previous Signature: 02/29/2020 4:07:36 PM Version By: [name] EMT/HBOT/SD Previous Signature: 02/29/2020 4:07:36 PM Version By: [name] EMT/HBOT/SD Entered By: Linton Ham on 02/29/2020 16:07:46 -------------------------------------------------------------------------------- HBO Safety Checklist Details Patient Name: Date of Service: Martin [name]. 02/29/2020 10:00 A M Medical Record Number: [id number] Patient Account Number: [id number] Date of Birth/Sex: Treating RN: 1946/03/21 (74 y.o. Martin [name]) Carlene Coria Primary Care Prescious Hurless: [name] Other Clinician: [name] Referring Mattheo Swindle: Treating Cartrell Bentsen/Extender: [name] in Treatment: 9 HBO Safety Checklist Items Safety Checklist Consent Form Signed Patient voided / foley secured and emptied When did you last eato n/a Last dose of injectable or oral agent insulin Ostomy pouch emptied and vented if applicable NA All implantable devices assessed, documented and approved NA Intravenous access site secured and place NA Valuables secured Linens and cotton and cotton/polyester blend (less than 51% polyester) Personal oil-based products / skin lotions / body lotions removed Wigs or hairpieces removed NA Smoking or tobacco materials removed NA Books / newspapers / magazines / loose paper removed Cologne, aftershave, perfume and deodorant removed Jewelry removed (may wrap wedding band) Make-up removed NA Hair care products removed Battery operated devices (external) removed NA Heating patches and chemical warmers removed NA Titanium eyewear removed NA Nail polish cured greater than 10 hours NA Casting material  cured greater than 10 hours Hearing aids removed NA Loose dentures or partials removed NA Prosthetics have been removed NA Patient demonstrates correct use of air break device (if applicable) Patient concerns have been addressed Patient grounding bracelet on and cord attached to chamber Specifics for Inpatients (complete in addition to above) Medication sheet sent with  patient Intravenous medications needed or due during therapy sent with patient Drainage tubes (e.g. nasogastric tube or chest tube secured and vented) Endotracheal or Tracheotomy tube secured Cuff deflated of air and inflated with saline Airway suctioned Electronic Signature(s) Signed: 02/29/2020 10:59:30 AM By: [name] EMT/HBOT/SD Entered By: [name] on 02/29/2020 10:59:29

## 2020-02-29 NOTE — Progress Notes (Signed)
Martin [name], Martin [name] ([id number]) Visit Report for 02/29/2020 Arrival Information Details Patient Name: Date of Service: LANI, MENDIOLA 02/29/2020 10:00 A M Medical Record Number: [id number] Patient Account Number: [id number] Date of Birth/Sex: Treating RN: March 17, 1946 (74 y.o. Martin [name]) Dolores Lory, Morey Hummingbird Primary Care Dorion Petillo: [name] Other Clinician: [name] Referring Chirag Krueger: Treating Gracy Ehly/Extender: [name] in Treatment: 9 Visit Information History Since Last Visit Added or deleted any medications: No Patient Arrived: [name] Any new allergies or adverse reactions: No Arrival Time: 09:30 Had a fall or experienced change in No Accompanied By: self activities of daily living that may affect Transfer Assistance: None risk of falls: Patient Identification Verified: Yes Signs or symptoms of abuse/neglect since last visito No Secondary Verification Process Completed: Yes Hospitalized since last visit: No Patient Requires Transmission-Based Precautions: No Implantable device outside of the clinic excluding No Patient Has Alerts: No cellular tissue based products placed in the center since last visit: Pain Present Now: No Electronic Signature(s) Signed: 02/29/2020 4:07:36 PM By: [name] EMT/HBOT/SD Entered By: [name] on 02/29/2020 14:19:42 -------------------------------------------------------------------------------- Encounter Discharge Information Details Patient Name: Date of Service: Martin [name]. 02/29/2020 10:00 A M Medical Record Number: [id number] Patient Account Number: [id number] Date of Birth/Sex: Treating RN: 02/04/1946 (74 y.o. Martin [name]) Carlene Coria Primary Care Tameshia Bonneville: [name] Other Clinician: [name] Referring Josie Burleigh: Treating Iyanni Hepp/Extender: [name] in Treatment: 9 Encounter Discharge Information Items Discharge Condition: Stable Ambulatory Status: [place] Discharge  Destination: Home Transportation: Private Auto Accompanied By: self Schedule Follow-up Appointment: Yes Clinical Summary of Care: Patient Declined Electronic Signature(s) Signed: 02/29/2020 4:07:36 PM By: [name] EMT/HBOT/SD Entered By: [name] on 02/29/2020 14:19:32 -------------------------------------------------------------------------------- Patient/Caregiver Education Details Patient Name: Date of Service: Martin [name] 11/2/2021andnbsp10:00 A M Medical Record Number: [id number] Patient Account Number: [id number] Date of Birth/Gender: Treating RN: 1946/02/23 (74 y.o. Martin [name]) Carlene Coria Primary Care Physician: [name] Other Clinician: [name] Referring Physician: Treating Physician/Extender: [name] in Treatment: 9 Education Assessment Education Provided To: Patient Education Topics Provided Hyperbaric Oxygenation: Methods: Explain/Verbal Responses: State content correctly Electronic Signature(s) Signed: 02/29/2020 4:07:36 PM By: [name] EMT/HBOT/SD Entered By: [name] on 02/29/2020 14:18:55 -------------------------------------------------------------------------------- Vitals Details Patient Name: Date of Service: Martin [name]. 02/29/2020 10:00 A M Medical Record Number: [id number] Patient Account Number: [id number] Date of Birth/Sex: Treating RN: Jan 15, 1946 (74 y.o. Martin [name]) Carlene Coria Primary Care Quenna Doepke: [name] Other Clinician: [name] Referring Crystin Lechtenberg: Treating Sondi Desch/Extender: [name] in Treatment: 9 Vital Signs Time Taken: 09:35 Temperature (F): 98.6 Height (in): 72 Pulse (bpm): 88 Weight (lbs): 270 Respiratory Rate (breaths/min): 15 Body Mass Index (BMI): 36.6 Blood Pressure (mmHg): 158/78 Capillary Blood Glucose (mg/dl): 183 Reference Range: 80 - 120 mg / dl Electronic Signature(s) Signed: 02/29/2020 4:07:36 PM By: [name] EMT/HBOT/SD Entered By: [name] on 02/29/2020 10:58:42

## 2020-03-01 ENCOUNTER — Encounter (HOSPITAL_BASED_OUTPATIENT_CLINIC_OR_DEPARTMENT_OTHER): Payer: PPO | Admitting: Physician Assistant

## 2020-03-01 ENCOUNTER — Other Ambulatory Visit: Payer: Self-pay

## 2020-03-01 ENCOUNTER — Encounter (HOSPITAL_BASED_OUTPATIENT_CLINIC_OR_DEPARTMENT_OTHER): Payer: PPO | Attending: Physician Assistant | Admitting: Physician Assistant

## 2020-03-01 DIAGNOSIS — M86272 Subacute osteomyelitis, left ankle and foot: Secondary | ICD-10-CM | POA: Insufficient documentation

## 2020-03-01 DIAGNOSIS — E11621 Type 2 diabetes mellitus with foot ulcer: Secondary | ICD-10-CM | POA: Diagnosis not present

## 2020-03-01 DIAGNOSIS — E1169 Type 2 diabetes mellitus with other specified complication: Secondary | ICD-10-CM | POA: Insufficient documentation

## 2020-03-01 DIAGNOSIS — L97524 Non-pressure chronic ulcer of other part of left foot with necrosis of bone: Secondary | ICD-10-CM | POA: Diagnosis not present

## 2020-03-01 DIAGNOSIS — L97522 Non-pressure chronic ulcer of other part of left foot with fat layer exposed: Secondary | ICD-10-CM | POA: Insufficient documentation

## 2020-03-01 DIAGNOSIS — I2510 Atherosclerotic heart disease of native coronary artery without angina pectoris: Secondary | ICD-10-CM | POA: Insufficient documentation

## 2020-03-01 DIAGNOSIS — I10 Essential (primary) hypertension: Secondary | ICD-10-CM | POA: Insufficient documentation

## 2020-03-01 LAB — GLUCOSE, CAPILLARY: Glucose-Capillary: 152 mg/dL — ABNORMAL HIGH (ref 70–99)

## 2020-03-01 NOTE — Progress Notes (Signed)
VERN, GUERETTE ([id number]) Visit Report for 03/01/2020 Arrival Information Details Patient Name: Date of Service: Martin [name], Martin [name] 03/01/2020 10:00 A M Medical Record Number: [id number] Patient Account Number: [id number] Date of Birth/Sex: Treating RN: 20-Oct-1945 (74 y.o. Martin [name], Martin [name] Primary Care Ayris Carano: [name] Other Clinician: [name] Referring Takayla Baillie: Treating Myrical Andujo/Extender: Sandria Bales in Treatment: 10 Visit Information History Since Last Visit Added or deleted any medications: No Patient Arrived: Martin [name] Any new allergies or adverse reactions: No Arrival Time: 10:40 Had a fall or experienced change in No Accompanied By: self activities of daily living that may affect Transfer Assistance: None risk of falls: Patient Identification Verified: Yes Signs or symptoms of abuse/neglect since last visito No Secondary Verification Process Completed: Yes Hospitalized since last visit: No Patient Requires Transmission-Based Precautions: No Implantable device outside of the clinic excluding No Patient Has Alerts: No cellular tissue based products placed in the center since last visit: Pain Present Now: No Electronic Signature(s) Signed: 03/01/2020 3:41:20 PM By: [name] EMT/HBOT/SD Entered By: [name] on 03/01/2020 11:05:47 -------------------------------------------------------------------------------- Encounter Discharge Information Details Patient Name: Date of Service: Martin [name]. 03/01/2020 10:00 A M Medical Record Number: [id number] Patient Account Number: [id number] Date of Birth/Sex: Treating RN: 22-Jan-1946 (74 y.o. Martin [name] Primary Care Sedric Guia: [name] Other Clinician: [name] Referring Shekinah Pitones: Treating Mertie Haslem/Extender: Sandria Bales in Treatment: 10 Encounter Discharge Information Items Discharge Condition: Stable Ambulatory Status:  [place] Discharge Destination: Home Transportation: Private Auto Accompanied By: self Schedule Follow-up Appointment: Yes Clinical Summary of Care: Patient Declined Electronic Signature(s) Signed: 03/01/2020 3:41:20 PM By: [name] EMT/HBOT/SD Entered By: [name] on 03/01/2020 13:38:14 -------------------------------------------------------------------------------- Patient/Caregiver Education Details Patient Name: Date of Service: Searson, Chancey W. 11/3/2021andnbsp10:00 A M Medical Record Number: [id number] Patient Account Number: [id number] Date of Birth/Gender: Treating RN: [birth year] (74 y.o. Martin [name] Primary Care Physician: [name] Other Clinician: [name] Referring Physician: Treating Physician/Extender: Sandria Bales in Treatment: 10 Education Assessment Education Provided To: Patient Education Topics Provided Hyperbaric Oxygenation: Methods: Explain/Verbal Responses: State content correctly Electronic Signature(s) Signed: 03/01/2020 3:41:20 PM By: [name] EMT/HBOT/SD Entered By: [name] on 03/01/2020 13:37:53 -------------------------------------------------------------------------------- Vitals Details Patient Name: Date of Service: Martin [name]. 03/01/2020 10:00 A M Medical Record Number: [id number] Patient Account Number: [id number] Date of Birth/Sex: Treating RN: 1946/01/19 (74 y.o. Martin [name] Primary Care Martin [name]: [name] Other Clinician: [name] Referring Vitor Overbaugh: Treating Kaleeyah Cuffie/Extender: Sandria Bales in Treatment: 10 Vital Signs Time Taken: 10:45 Temperature (F): 98.2 Height (in): 72 Pulse (bpm): 88 Weight (lbs): 270 Respiratory Rate (breaths/min): 15 Body Mass Index (BMI): 36.6 Blood Pressure (mmHg): 166/77 Capillary Blood Glucose (mg/dl): 152 Reference Range: 80 - 120 mg / dl Electronic Signature(s) Signed: 03/01/2020  3:41:20 PM By: [name] EMT/HBOT/SD Entered By: [name] on 03/01/2020 11:06:05

## 2020-03-01 NOTE — Progress Notes (Signed)
[name], [name] ([id number]) Visit Report for 03/01/2020 Problem List Details Patient Name: Date of Service: Martin [name], Martin [name] 03/01/2020 10:00 A M Medical Record Number: [id number] Patient Account Number: [id number] Date of Birth/Sex: Treating RN: 1946/02/16 (74 y.o. 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 03/01/2020 Medical Record Number: [id number] Patient Account Number: [id number] Date of Birth/Sex: Treating RN: 12/26/1945 (74 y.o. 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 I10 Essential (primary) hypertension I25.10 Atherosclerotic heart disease of native coronary artery without angina pectoris M86.272 Subacute osteomyelitis, left ankle and  foot Facility Procedures CPT4 Code: [id number] Description: G0277-(Facility Use Only) HBOT full body chamber, 3min , Modifier: Quantity: 4 Physician Procedures : CPT4 Code Description Modifier [id number] - WC PHYS HYPERBARIC OXYGEN THERAPY ICD-10 Diagnosis Description E11.621 Type 2 diabetes mellitus with foot ulcer Quantity: 1 Electronic Signature(s) Signed: 03/01/2020 5:19:02 PM By: Worthy Keeler PA-C Previous Signature: 03/01/2020 3:41:20 PM Version By: [name] EMT/HBOT/SD Entered By: Worthy Keeler on 03/01/2020 17:19:01

## 2020-03-01 NOTE — Progress Notes (Addendum)
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 -------------------------------------------------------------------------------- SuperBill Details Patient Name: Date of  Service: Martin [name]. 03/01/2020 Medical Record Number: [id number] Patient Account Number: [id number] Date of Birth/Sex: Treating RN: [birth year] (74 y.o. 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, left ankle and foot Facility Procedures CPT4 Code: [id number] Description: [phone] - APPLY TOTAL CONTACT LEG CAST ICD-10 Diagnosis Description L97.522 Non-pressure chronic ulcer of other part of left foot with fat layer exposed Modifier: Quantity: 1 Physician Procedures : CPT4 Code Description Modifier [id number] - WC PHYS APPLY TOTAL CONTACT CAST ICD-10 Diagnosis Description L97.522 Non-pressure chronic ulcer of other part of left foot with fat layer exposed Quantity: 1 Electronic Signature(s) Signed: 03/01/2020 1:23:52 PM By: Worthy Keeler PA-C Previous Signature: 03/01/2020 1:23:29 PM Version By: Worthy Keeler PA-C Entered By: Worthy Keeler on 03/01/2020 13:23:51

## 2020-03-01 NOTE — Progress Notes (Addendum)
KEDARIUS, ALOISI ([id number]) Visit Report for 03/01/2020 HBO Details Patient Name: Date of Service: BOWIE, DELIA 03/01/2020 10:00 A M Medical Record Number: [id number] Patient Account Number: [id number] Date of Birth/Sex: Treating RN: January 01, 1946 (74 y.o. [name] Primary Care Ogden Handlin: [name] Other Clinician: [name] Referring Dejay Kronk: Treating Alysah Carton/Extender: Sandria Bales in Treatment: 10 HBO Treatment Course Details Treatment Course Number: 1 Ordering Brionna Romanek: Worthy Keeler T Treatments Ordered: otal 40 HBO Treatment Start Date: 02/22/2020 HBO Indication: Diabetic Ulcer(s) of the Lower Extremity HBO Treatment Details Treatment Number: 6 Patient Type: Outpatient Chamber Type: Monoplace Chamber Serial #: [id number] Treatment Protocol: 2.0 ATA with 90 minutes oxygen, and no air breaks Treatment Details Compression Rate Down: 2.0 psi / minute De-Compression Rate Up: 2.0 psi / minute Air breaks and breathing Decompress Decompress Compress Tx Pressure Begins Reached periods Begins Ends (leave unused spaces blank) Chamber Pressure (ATA 1 2 ------2 1 ) Clock Time (24 hr) 11:02 11:10 - - - - - - 12:40 12:48 Treatment Length: 106 (minutes) Treatment Segments: 4 Vital Signs Capillary Blood Glucose Reference Range: 80 - 120 mg / dl HBO Diabetic Blood Glucose Intervention Range: <131 mg/dl or >249 mg/dl Time Vitals Blood Respiratory Capillary Blood Glucose Pulse Action Type: Pulse: Temperature: Taken: Pressure: Rate: Glucose (mg/dl): Meter #: Oximetry (%) Taken: Pre 10:45 166/77 88 15 98.2 152 Post 12:50 169/83 72 16 98.2 149 Treatment Response Treatment Toleration: Well Treatment Completion Status: Treatment Completed without Adverse Event Additional Procedure Documentation Tissue Sevierity: Necrosis of bone Physician HBO Attestation: I certify that I supervised this HBO treatment in accordance with  Medicare guidelines. A trained emergency response team is readily available per Yes hospital policies and procedures. Continue HBOT as ordered. Yes Electronic Signature(s) Signed: 03/01/2020 5:18:56 PM By: Worthy Keeler PA-C Previous Signature: 03/01/2020 3:41:20 PM Version By: [name] EMT/HBOT/SD Entered By: Worthy Keeler on 03/01/2020 17:18:56 -------------------------------------------------------------------------------- HBO Safety Checklist Details Patient Name: Date of Service: [name] 03/01/2020 10:00 A M Medical Record Number: [id number] Patient Account Number: [id number] Date of Birth/Sex: Treating RN: 05-04-1945 (74 y.o. [name] Primary Care Jeanni Allshouse: [name] Other Clinician: [name] Referring Miaya Lafontant: Treating Charleton Deyoung/Extender: Sandria Bales in Treatment: 10 HBO Safety Checklist Items Safety Checklist Consent Form Signed Patient voided / foley secured and emptied When did you last eato n/a Last dose of injectable or oral agent insulin Ostomy pouch emptied and vented if applicable NA All implantable devices assessed, documented and approved NA Intravenous access site secured and place NA Valuables secured Linens and cotton and cotton/polyester blend (less than 51% polyester) Personal oil-based products / skin lotions / body lotions removed Wigs or hairpieces removed NA Smoking or tobacco materials removed NA Books / newspapers / magazines / loose paper removed Cologne, aftershave, perfume and deodorant removed Jewelry removed (may wrap wedding band) Make-up removed NA Hair care products removed Battery operated devices (external) removed NA Heating patches and chemical warmers removed NA Titanium eyewear removed NA Nail polish cured greater than 10 hours NA Casting material cured greater than 10 hours NA Hearing aids removed NA Loose dentures or partials removed NA Prosthetics  have been removed NA Patient demonstrates correct use of air break device (if applicable) Patient concerns have been addressed Patient grounding bracelet on and cord attached to chamber Specifics for Inpatients (complete in addition to above) Medication sheet sent with patient Intravenous medications needed or due during therapy sent with patient Drainage  tubes (e.g. nasogastric tube or chest tube secured and vented) Endotracheal or Tracheotomy tube secured Cuff deflated of air and inflated with saline Airway suctioned Electronic Signature(s) Signed: 03/01/2020 11:07:09 AM By: [name] EMT/HBOT/SD Entered By: [name] on 03/01/2020 11:07:08

## 2020-03-02 ENCOUNTER — Other Ambulatory Visit: Payer: Self-pay

## 2020-03-02 ENCOUNTER — Encounter (HOSPITAL_BASED_OUTPATIENT_CLINIC_OR_DEPARTMENT_OTHER): Payer: PPO | Admitting: Internal Medicine

## 2020-03-02 DIAGNOSIS — E11621 Type 2 diabetes mellitus with foot ulcer: Secondary | ICD-10-CM | POA: Diagnosis not present

## 2020-03-02 DIAGNOSIS — L97514 Non-pressure chronic ulcer of other part of right foot with necrosis of bone: Secondary | ICD-10-CM | POA: Diagnosis not present

## 2020-03-02 LAB — GLUCOSE, CAPILLARY
Glucose-Capillary: 149 mg/dL — ABNORMAL HIGH (ref 70–99)
Glucose-Capillary: 124 mg/dL — ABNORMAL HIGH (ref 70–99)
Glucose-Capillary: 135 mg/dL — ABNORMAL HIGH (ref 70–99)
Glucose-Capillary: 158 mg/dL — ABNORMAL HIGH (ref 70–99)

## 2020-03-02 NOTE — Progress Notes (Signed)
MALEKO, GREULICH ([id number]) Visit Report for 03/02/2020 Arrival Information Details Patient Name: Date of Service: KAYMEN, Martin [name] 03/02/2020 10:00 A M Medical Record Number: [id number] Patient Account Number: [id number] Date of Birth/Sex: Treating RN: 1946/03/03 (74 y.o. [name], [name] Primary Care Alexine Pilant: [name] Other Clinician: [name] Referring Brandace Cargle: Treating Leshawn Straka/Extender: [name] in Treatment: 10 Visit Information History Since Last Visit Added or deleted any medications: No Patient Arrived: [name] Any new allergies or adverse reactions: No Arrival Time: 09:40 Had a fall or experienced change in No Accompanied By: self activities of daily living that may affect Transfer Assistance: None risk of falls: Patient Identification Verified: Yes Signs or symptoms of abuse/neglect since last visito No Secondary Verification Process Completed: Yes Hospitalized since last visit: No Patient Requires Transmission-Based Precautions: No Implantable device outside of the clinic excluding No Patient Has Alerts: No cellular tissue based products placed in the center since last visit: Pain Present Now: No Electronic Signature(s) Signed: 03/02/2020 3:38:52 PM By: [name] EMT/HBOT/SD Entered By: [name] on 03/02/2020 10:24:54 -------------------------------------------------------------------------------- Encounter Discharge Information Details Patient Name: Date of Service: Martin [name]. 03/02/2020 10:00 A M Medical Record Number: [id number] Patient Account Number: [id number] Date of Birth/Sex: Treating RN: 1945-10-04 (74 y.o. Martin [name] Primary Care Kutler Vanvranken: [name] Other Clinician: [name] Referring Fabian Walder: Treating Chyrl Elwell/Extender: [name] in Treatment: 10 Encounter Discharge Information Items Discharge Condition: Stable Ambulatory Status: Cane Discharge  Destination: Home Transportation: Private Auto Accompanied By: self Schedule Follow-up Appointment: Yes Clinical Summary of Care: Patient Declined Electronic Signature(s) Signed: 03/02/2020 3:38:52 PM By: [name] EMT/HBOT/SD Entered By: [name] on 03/02/2020 13:13:39 -------------------------------------------------------------------------------- Patient/Caregiver Education Details Patient Name: Date of Service: Merk, Harvy W. 11/4/2021andnbsp10:00 A M Medical Record Number: [id number] Patient Account Number: [id number] Date of Birth/Gender: Treating RN: 03-23-1946 (74 y.o. Martin [name] Primary Care Physician: [name] Other Clinician: [name] Referring Physician: Treating Physician/Extender: [name] in Treatment: 10 Education Assessment Education Provided To: Patient Education Topics Provided Hyperbaric Oxygenation: Methods: Explain/Verbal Responses: State content correctly Electronic Signature(s) Signed: 03/02/2020 3:38:52 PM By: [name] EMT/HBOT/SD Entered By: [name] on 03/02/2020 13:13:22 -------------------------------------------------------------------------------- Vitals Details Patient Name: Date of Service: Martin [name]. 03/02/2020 10:00 A M Medical Record Number: [id number] Patient Account Number: [id number] Date of Birth/Sex: Treating RN: 1945/06/23 (74 y.o. [name], [name] Primary Care Inga Noller: [name] Other Clinician: [name] Referring Jontrell Bushong: Treating Mena Lienau/Extender: [name] in Treatment: 10 Vital Signs Time Taken: 09:45 Temperature (F): 97.8 Height (in): 72 Pulse (bpm): 69 Weight (lbs): 270 Respiratory Rate (breaths/min): 16 Body Mass Index (BMI): 36.6 Blood Pressure (mmHg): 167/86 Capillary Blood Glucose (mg/dl): 135 Reference Range: 80 - 120 mg / dl Electronic Signature(s) Signed: 03/02/2020 3:38:52 PM By: [name] EMT/HBOT/SD Entered By: [name] on 03/02/2020 10:25:12

## 2020-03-02 NOTE — Progress Notes (Addendum)
ADAM, SANJUAN ([id number]) Visit Report for 03/02/2020 HBO Details Patient Name: Date of Service: Martin [name], Martin [name] 03/02/2020 10:00 A M Medical Record Number: [id number] Patient Account Number: [id number] Date of Birth/Sex: Treating RN: 1946/03/23 (74 y.o. [name], [name] Primary Care Annali Lybrand: [name] Other Clinician: [name] Referring Sora Olivo: Treating Talyn Dessert/Extender: [name] in Treatment: 10 HBO Treatment Course Details Treatment Course Number: 1 Ordering Amea Mcphail: Worthy Keeler T Treatments Ordered: otal 40 HBO Treatment Start Date: 02/22/2020 HBO Indication: Diabetic Ulcer(s) of the Lower Extremity HBO Treatment Details Treatment Number: 7 Patient Type: Outpatient Chamber Type: Monoplace Chamber Serial #: [id number] Treatment Protocol: 2.0 ATA with 90 minutes oxygen, and no air breaks Treatment Details Compression Rate Down: 2.0 psi / minute De-Compression Rate Up: 2.0 psi / minute Air breaks and breathing Decompress Decompress Compress Tx Pressure Begins Reached periods Begins Ends (leave unused spaces blank) Chamber Pressure (ATA 1 2 ------2 1 ) Clock Time (24 hr) 10:05 10:13 - - - - - - 11:43 11:51 Treatment Length: 106 (minutes) Treatment Segments: 4 Vital Signs Capillary Blood Glucose Reference Range: 80 - 120 mg / dl HBO Diabetic Blood Glucose Intervention Range: <131 mg/dl or >249 mg/dl Time Vitals Blood Respiratory Capillary Blood Glucose Pulse Action Type: Pulse: Temperature: Taken: Pressure: Rate: Glucose (mg/dl): Meter #: Oximetry (%) Taken: Pre 09:45 167/86 69 16 97.8 135 Post 11:53 155/72 68 15 98 158 Treatment Response Treatment Toleration: Well Treatment Completion Status: Treatment Completed without Adverse Event Additional Procedure Documentation Tissue Sevierity: Necrosis of bone Fayez Sturgell Notes No concerns with treatment given Physician HBO Attestation: I certify that I supervised  this HBO treatment in accordance with Medicare guidelines. A trained emergency response team is readily available per Yes hospital policies and procedures. Continue HBOT as ordered. Yes Electronic Signature(s) Signed: 03/02/2020 5:37:49 PM By: Linton Ham MD Previous Signature: 03/02/2020 3:38:52 PM Version By: [name] EMT/HBOT/SD Previous Signature: 03/02/2020 3:38:52 PM Version By: [name] EMT/HBOT/SD Entered By: Linton Ham on 03/02/2020 17:02:42 -------------------------------------------------------------------------------- HBO Safety Checklist Details Patient Name: Date of Service: Martin [name]. 03/02/2020 10:00 A M Medical Record Number: [id number] Patient Account Number: [id number] Date of Birth/Sex: Treating RN: Nov 18, 1945 (74 y.o. [name], [name] Primary Care Delio Slates: [name] Other Clinician: [name] Referring Aragorn Recker: Treating Amilyah Nack/Extender: [name] in Treatment: 10 HBO Safety Checklist Items Safety Checklist Consent Form Signed Patient voided / foley secured and emptied When did you last eato n/a Last dose of injectable or oral agent insulin Ostomy pouch emptied and vented if applicable NA All implantable devices assessed, documented and approved NA Intravenous access site secured and place NA Valuables secured Linens and cotton and cotton/polyester blend (less than 51% polyester) Personal oil-based products / skin lotions / body lotions removed Wigs or hairpieces removed NA Smoking or tobacco materials removed NA Books / newspapers / magazines / loose paper removed Cologne, aftershave, perfume and deodorant removed Jewelry removed (may wrap wedding band) Make-up removed NA Hair care products removed Battery operated devices (external) removed NA Heating patches and chemical warmers removed NA Titanium eyewear removed NA Nail polish cured greater than 10 hours NA Casting  material cured greater than 10 hours Hearing aids removed NA Loose dentures or partials removed NA Prosthetics have been removed NA Patient demonstrates correct use of air break device (if applicable) Patient concerns have been addressed Patient grounding bracelet on and cord attached to chamber Specifics for Inpatients (complete in addition to above) Medication sheet sent with  patient Intravenous medications needed or due during therapy sent with patient Drainage tubes (e.g. nasogastric tube or chest tube secured and vented) Endotracheal or Tracheotomy tube secured Cuff deflated of air and inflated with saline Airway suctioned Electronic Signature(s) Signed: 03/02/2020 10:25:57 AM By: [name] EMT/HBOT/SD Entered By: [name] on 03/02/2020 10:25:56

## 2020-03-02 NOTE — Progress Notes (Signed)
ANDI, MAHAFFY ([id number]) Visit Report for 03/02/2020 SuperBill Details Patient Name: Date of Service: Martin [name], Martin [name] 03/02/2020 Medical Record Number: [id number] Patient Account Number: [id number] Date of Birth/Sex: Treating RN: October 31, 1945 (74 y.o. 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

## 2020-03-03 ENCOUNTER — Encounter (HOSPITAL_BASED_OUTPATIENT_CLINIC_OR_DEPARTMENT_OTHER): Payer: PPO | Admitting: Internal Medicine

## 2020-03-03 DIAGNOSIS — E11621 Type 2 diabetes mellitus with foot ulcer: Secondary | ICD-10-CM | POA: Diagnosis not present

## 2020-03-03 DIAGNOSIS — L97514 Non-pressure chronic ulcer of other part of right foot with necrosis of bone: Secondary | ICD-10-CM | POA: Diagnosis not present

## 2020-03-03 LAB — GLUCOSE, CAPILLARY
Glucose-Capillary: 163 mg/dL — ABNORMAL HIGH (ref 70–99)
Glucose-Capillary: 158 mg/dL — ABNORMAL HIGH (ref 70–99)

## 2020-03-03 NOTE — Progress Notes (Signed)
Martin [name], Martin [name] ([id number]) Visit Report for 03/01/2020 Arrival Information Details Patient Name: Date of Service: Martin [name], Martin [name] 03/01/2020 9:15 A M Medical Record Number: [id number] Patient Account Number: [id number] Date of Birth/Sex: Treating RN: Jan 16, 1946 (74 y.o. Martin [name] Primary Care Takako Minckler: [name] Other Clinician: Referring [name]: Treating Makael Stein/Extender: Sandria Bales in Treatment: 10 Visit Information History Since Last Visit Added or deleted any medications: No Patient Arrived: Martin [name] Any new allergies or adverse reactions: No Arrival Time: 09:37 Had a fall or experienced change in No Accompanied By: self activities of daily living that may affect Transfer Assistance: None risk of falls: Patient Identification Verified: Yes Signs or symptoms of abuse/neglect since last visito No Secondary Verification Process Completed: Yes Implantable device outside of the clinic excluding No Patient Requires Transmission-Based Precautions: No cellular tissue based products placed in the center Patient Has Alerts: No since last visit: Has Dressing in Place as Prescribed: Yes Pain Present Now: No Electronic Signature(s) Signed: 03/02/2020 9:07:27 AM By: Sandre Kitty Entered By: Sandre Kitty on 03/01/2020 09:38:28 -------------------------------------------------------------------------------- Encounter Discharge Information Details Patient Name: Date of Service: Martin [name]. 03/01/2020 9:15 A M Medical Record Number: [id number] Patient Account Number: [id number] Date of Birth/Sex: Treating RN: 1946/02/07 (74 y.o. Martin [name] Primary Care Orvilla Truett: [name] Other Clinician: Referring Dejanee Thibeaux: Treating Shriya Aker/Extender: Sandria Bales in Treatment: 10 Encounter Discharge Information Items Discharge Condition: Stable Ambulatory Status: Cane Discharge Destination: Home Transportation:  Private Auto Accompanied By: self Schedule Follow-up Appointment: Yes Clinical Summary of Care: Patient Declined Electronic Signature(s) Signed: 03/03/2020 5:00:24 PM By: Carlene Coria RN Entered By: Carlene Coria on 03/01/2020 10:44:03 -------------------------------------------------------------------------------- Lower Extremity Assessment Details Patient Name: Date of Service: Martin [name] 03/01/2020 9:15 A M Medical Record Number: [id number] Patient Account Number: [id number] Date of Birth/Sex: Treating RN: [birth year] (74 y.o. Martin [name] Primary Care Oaklan Persons: [name] Other Clinician: Referring Fred Hammes: Treating Elke Holtry/Extender: Sandria Bales in Treatment: 10 Edema Assessment Assessed: Martin [name]: No] [Right: Yes] Edema: [Left: Ye] [Right: s] Calf Left: Right: Point of Measurement: From Medial Instep 42 cm Ankle Left: Right: Point of Measurement: From Medial Instep 27 cm Vascular Assessment Pulses: Dorsalis Pedis Palpable: [Left:Yes] Electronic Signature(s) Signed: 03/01/2020 5:51:45 PM By: Baruch Gouty RN, BSN Entered By: Baruch Gouty on 03/01/2020 10:12:50 -------------------------------------------------------------------------------- Multi-Disciplinary Care Plan Details Patient Name: Date of Service: Martin [name]. 03/01/2020 9:15 A M Medical Record Number: [id number] Patient Account Number: [id number] Date of Birth/Sex: Treating RN: [birth year] (74 y.o. Martin [name] Primary Care Janette Harvie: [name] Other Clinician: Referring Alton Tremblay: Treating Emiley Digiacomo/Extender: Sandria Bales in Treatment: 10 Active Inactive Nutrition Nursing Diagnoses: Impaired glucose control: actual or potential Potential for alteratiion in Nutrition/Potential for imbalanced nutrition Goals: Patient/caregiver will maintain therapeutic glucose control Date Initiated: 12/22/2019 Target Resolution Date:  03/22/2020 Goal Status: Active Interventions: Assess HgA1c results as ordered upon admission and as needed Treatment Activities: Patient referred to Primary Care Physician for further nutritional evaluation : 12/22/2019 Notes: Wound/Skin Impairment Nursing Diagnoses: Impaired tissue integrity Knowledge deficit related to ulceration/compromised skin integrity Goals: Patient/caregiver will verbalize understanding of skin care regimen Date Initiated: 12/22/2019 Target Resolution Date: 03/15/2020 Goal Status: Active Ulcer/skin breakdown will have a volume reduction of 30% by week 4 Date Initiated: 12/22/2019 Date Inactivated: 01/19/2020 Target Resolution Date: 01/19/2020 Goal Status: Unmet Unmet Reason: infection Ulcer/skin breakdown will have a volume reduction of 50% by week 8 Date Initiated: 01/19/2020 Date Inactivated: 02/16/2020  Target Resolution Date: 02/16/2020 Goal Status: Unmet Unmet Reason: infection Ulcer/skin breakdown will have a volume reduction of 80% by week 12 Date Initiated: 02/16/2020 Target Resolution Date: 03/15/2020 Goal Status: Active Interventions: Assess patient/caregiver ability to obtain necessary supplies Assess patient/caregiver ability to perform ulcer/skin care regimen upon admission and as needed Assess ulceration(s) every visit Provide education on ulcer and skin care Treatment Activities: Skin care regimen initiated : 12/22/2019 Topical wound management initiated : 12/22/2019 Notes: Electronic Signature(s) Signed: 03/01/2020 5:51:45 PM By: Baruch Gouty RN, BSN Entered By: Baruch Gouty on 03/01/2020 09:58:20 -------------------------------------------------------------------------------- Pain Assessment Details Patient Name: Date of Service: Martin [name]. 03/01/2020 9:15 A M Medical Record Number: [id number] Patient Account Number: [id number] Date of Birth/Sex: Treating RN: 1946-04-05 (74 y.o. Martin [name] Primary Care Lindalee Huizinga:  [name] Other Clinician: Referring Wynter Isaacs: Treating Sophie Tamez/Extender: Sandria Bales in Treatment: 10 Active Problems Location of Pain Severity and Description of Pain Patient Has Paino No Site Locations Pain Management and Medication Current Pain Management: Electronic Signature(s) Signed: 03/01/2020 5:51:45 PM By: Baruch Gouty RN, BSN Signed: 03/02/2020 9:07:27 AM By: Sandre Kitty Entered By: Sandre Kitty on 03/01/2020 09:39:31 -------------------------------------------------------------------------------- Patient/Caregiver Education Details Patient Name: Date of Service: Martin [name] 11/3/2021andnbsp9:15 A M Medical Record Number: [id number] Patient Account Number: [id number] Date of Birth/Gender: Treating RN: 1945/10/04 (74 y.o. Martin [name] Primary Care Physician: [name] Other Clinician: Referring Physician: Treating Physician/Extender: Sandria Bales in Treatment: 10 Education Assessment Education Provided To: Patient Education Topics Provided Elevated Blood Sugar/ Impact on Healing: Methods: Explain/Verbal Responses: Reinforcements needed, State content correctly Hyperbaric Oxygenation: Methods: Explain/Verbal Responses: Reinforcements needed, State content correctly Offloading: Methods: Explain/Verbal Responses: Reinforcements needed, State content correctly Wound/Skin Impairment: Methods: Explain/Verbal Responses: Reinforcements needed, State content correctly Electronic Signature(s) Signed: 03/01/2020 5:51:45 PM By: Baruch Gouty RN, BSN Entered By: Baruch Gouty on 03/01/2020 09:59:27 -------------------------------------------------------------------------------- Wound Assessment Details Patient Name: Date of Service: Martin [name]. 03/01/2020 9:15 A M Medical Record Number: [id number] Patient Account Number: [id number] Date of Birth/Sex: Treating RN: [birth year]  (74 y.o. Martin [name] Primary Care Kentrell Guettler: [name] Other Clinician: Referring Yari Szeliga: Treating Jahkai Yandell/Extender: Sandria Bales in Treatment: 10 Wound Status Wound Number: 5 Primary Diabetic Wound/Ulcer of the Lower Extremity Etiology: Wound Location: Left Metatarsal head fifth Wound Open Wounding Event: Gradually Appeared Status: Date Acquired: 10/31/2019 Comorbid Cataracts, Coronary Artery Disease, Hypertension, Type II Weeks Of Treatment: 10 History: Diabetes, Neuropathy Clustered Wound: No Photos Photo Uploaded By: [name] on 03/03/2020 10:51:28 Wound Measurements Length: (cm) 0.5 Width: (cm) 0.5 Depth: (cm) 0.3 Area: (cm) 0.196 Volume: (cm) 0.059 % Reduction in Area: 83.8% % Reduction in Volume: 93% Epithelialization: Medium (34-66%) Tunneling: No Undermining: Yes Starting Position (o'clock): 8 Ending Position (o'clock): 12 Maximum Distance: (cm) 0.3 Wound Description Classification: Grade 3 Wound Margin: Epibole Exudate Amount: Small Exudate Type: Serous Exudate Color: amber Foul Odor After Cleansing: No Slough/Fibrino Yes Wound Bed Granulation Amount: Large (67-100%) Exposed Structure Granulation Quality: Red Fascia Exposed: No Necrotic Amount: None Present (0%) Fat Layer (Subcutaneous Tissue) Exposed: Yes Tendon Exposed: No Muscle Exposed: No Joint Exposed: No Bone Exposed: No Treatment Notes Wound #5 (Left Metatarsal head fifth) 1. Cleanse With Wound Cleanser 3. Primary Dressing Applied Collegen AG 4. Secondary Dressing Dry Gauze Roll Gauze Foam Drawtex 7. Footwear/Offloading device applied T Contact Cast otal Notes foam donut. extra padding to left foot. last layer of TCC applied by PA. Electronic Signature(s) Signed: 03/01/2020 5:51:45  PM By: Baruch Gouty RN, BSN Entered By: Baruch Gouty on 03/01/2020  10:13:26 -------------------------------------------------------------------------------- Vitals Details Patient Name: Date of Service: Martin [name]. 03/01/2020 9:15 A M Medical Record Number: [id number] Patient Account Number: [id number] Date of Birth/Sex: Treating RN: 1945/09/12 (74 y.o. Martin [name] Primary Care Yalonda Sample: [name] Other Clinician: Referring Jahmia Berrett: Treating Chinonso Linker/Extender: Sandria Bales in Treatment: 10 Vital Signs Time Taken: 09:38 Temperature (F): 98.2 Height (in): 72 Pulse (bpm): 88 Weight (lbs): 270 Respiratory Rate (breaths/min): 15 Body Mass Index (BMI): 36.6 Blood Pressure (mmHg): 166/77 Capillary Blood Glucose (mg/dl): 133 Reference Range: 80 - 120 mg / dl Electronic Signature(s) Signed: 03/02/2020 9:07:27 AM By: Sandre Kitty Entered By: Sandre Kitty on 03/01/2020 09:39:24

## 2020-03-03 NOTE — Progress Notes (Addendum)
Martin, [name] ([id number]) Visit Report for 03/03/2020 HBO Details Patient Name: Date of Service: Martin [name], Martin [name] 03/03/2020 9:30 A M Medical Record Number: [id number] Patient Account Number: [id number] Date of Birth/Sex: Treating RN: [birth year] (74 y.o. Martin [name] Primary Care Farzad Tibbetts: [name] Other Clinician: [name] Referring Eldean Nanna: Treating Jes Costales/Extender: [name] in Treatment: 10 HBO Treatment Course Details Treatment Course Number: 1 Ordering Flo Berroa: Worthy Keeler T Treatments Ordered: otal 40 HBO Treatment Start Date: 02/22/2020 HBO Indication: Diabetic Ulcer(s) of the Lower Extremity HBO Treatment Details Treatment Number: 8 Patient Type: Outpatient Chamber Type: Monoplace Chamber Serial #: [id number] Treatment Protocol: 2.0 ATA with 90 minutes oxygen, and no air breaks Treatment Details Compression Rate Down: 2.0 psi / minute De-Compression Rate Up: 2.0 psi / minute Air breaks and breathing Decompress Decompress Compress Tx Pressure Begins Reached periods Begins Ends (leave unused spaces blank) Chamber Pressure (ATA 1 2 ------2 1 ) Clock Time (24 hr) 09:41 09:49 - - - - - - 11:19 11:27 Treatment Length: 106 (minutes) Treatment Segments: 4 Vital Signs Capillary Blood Glucose Reference Range: 80 - 120 mg / dl HBO Diabetic Blood Glucose Intervention Range: <131 mg/dl or >249 mg/dl Time Vitals Blood Respiratory Capillary Blood Glucose Pulse Action Type: Pulse: Temperature: Taken: Pressure: Rate: Glucose (mg/dl): Meter #: Oximetry (%) Taken: Pre 09:25 186/76 86 18 97.7 163 Post 11:30 171/81 70 16 98 158 Treatment Response Treatment Toleration: Well Treatment Completion Status: Treatment Completed without Adverse Event Additional Procedure Documentation Tissue Sevierity: Necrosis of bone Electronic Signature(s) Signed: 03/03/2020 3:21:52 PM By: [name] EMT/HBOT/SD Signed: 03/06/2020  1:46:42 PM By: Linton Ham MD Entered By: [name] on 03/03/2020 13:25:19 -------------------------------------------------------------------------------- HBO Safety Checklist Details Patient Name: Date of Service: Martin [name]. 03/03/2020 9:30 A M Medical Record Number: [id number] Patient Account Number: [id number] Date of Birth/Sex: Treating RN: 15-Nov-1945 (74 y.o. Martin [name] Primary Care Juelz Claar: [name] Other Clinician: [name] Referring Camyah Pultz: Treating Rheana Casebolt/Extender: [name] in Treatment: 10 HBO Safety Checklist Items Safety Checklist Consent Form Signed Patient voided / foley secured and emptied When did you last eato n/a Last dose of injectable or oral agent insulin Ostomy pouch emptied and vented if applicable NA All implantable devices assessed, documented and approved NA Intravenous access site secured and place NA Valuables secured Linens and cotton and cotton/polyester blend (less than 51% polyester) Personal oil-based products / skin lotions / body lotions removed Wigs or hairpieces removed NA Smoking or tobacco materials removed NA Books / newspapers / magazines / loose paper removed Cologne, aftershave, perfume and deodorant removed Jewelry removed (may wrap wedding band) Make-up removed NA Hair care products removed Battery operated devices (external) removed NA Heating patches and chemical warmers removed NA Titanium eyewear removed NA Nail polish cured greater than 10 hours NA Casting material cured greater than 10 hours Hearing aids removed NA Loose dentures or partials removed NA Prosthetics have been removed NA Patient demonstrates correct use of air break device (if applicable) Patient concerns have been addressed Patient grounding bracelet on and cord attached to chamber Specifics for Inpatients (complete in addition to above) Medication sheet sent with  patient Intravenous medications needed or due during therapy sent with patient Drainage tubes (e.g. nasogastric tube or chest tube secured and vented) Endotracheal or Tracheotomy tube secured Cuff deflated of air and inflated with saline Airway suctioned Electronic Signature(s) Signed: 03/03/2020 10:35:25 AM By: [name] EMT/HBOT/SD Entered By: [name] on 03/03/2020 10:35:23

## 2020-03-03 NOTE — Progress Notes (Signed)
Martin [name], Martin [name] ([id number]) Visit Report for 03/03/2020 Arrival Information Details Patient Name: Date of Service: Martin [name], Martin [name] 03/03/2020 9:30 A M Medical Record Number: [id number] Patient Account Number: [id number] Date of Birth/Sex: Treating RN: [birth year] (74 y.o. Martin [name], Martin [name] Primary Care Martin [name]: Martin [name] Other Clinician: Mikeal [name] Referring Martin [name]: Treating Martin [name]/Extender: Martin [name] in Treatment: 10 Visit Information History Since Last Visit Added or deleted any medications: No Patient Arrived: Martin [name] Any new allergies or adverse reactions: No Arrival Time: 09:20 Had a fall or experienced change in No Accompanied By: self activities of daily living that may affect Transfer Assistance: None risk of falls: Patient Identification Verified: Yes Signs or symptoms of abuse/neglect since last visito No Secondary Verification Process Completed: Yes Hospitalized since last visit: No Patient Requires Transmission-Based Precautions: No Implantable device outside of the clinic excluding No Patient Has Alerts: No cellular tissue based products placed in the center since last visit: Pain Present Now: No Electronic Signature(s) Signed: 03/03/2020 3:21:52 PM By: Martin [name] EMT/HBOT/SD Entered By: Martin [name] on 03/03/2020 10:34:13 -------------------------------------------------------------------------------- Encounter Discharge Information Details Patient Name: Date of Service: Martin [name]. 03/03/2020 9:30 A M Medical Record Number: [id number] Patient Account Number: [id number] Date of Birth/Sex: Treating RN: Feb 03, 1946 (74 y.o. Martin [name] Primary Care Martin [name]: Martin [name] Other Clinician: Mikeal [name] Referring Martin [name]: Treating Martin [name]/Extender: Martin [name] in Treatment: 10 Encounter Discharge Information Items Discharge Condition: Stable Ambulatory Status:  Cane Discharge Destination: Home Transportation: Private Auto Accompanied By: self Schedule Follow-up Appointment: Yes Clinical Summary of Care: Patient Declined Electronic Signature(s) Signed: 03/03/2020 3:21:52 PM By: Martin [name] EMT/HBOT/SD Entered By: Martin [name] on 03/03/2020 13:26:58 -------------------------------------------------------------------------------- Patient/Caregiver Education Details Patient Name: Date of Service: Martin [name] 11/5/2021andnbsp9:30 [place] Record Number: [id number] Patient Account Number: [id number] Date of Birth/Gender: Treating RN: 1945/11/02 (74 y.o. Martin [name] Primary Care Physician: Martin [name] Other Clinician: Mikeal [name] Referring Physician: Treating Physician/Extender: Martin [name] in Treatment: 10 Education Assessment Education Provided To: Patient Education Topics Provided Hyperbaric Oxygenation: Methods: Explain/Verbal Responses: State content correctly Electronic Signature(s) Signed: 03/03/2020 3:21:52 PM By: Martin [name] EMT/HBOT/SD Entered By: Martin [name] on 03/03/2020 13:26:31 -------------------------------------------------------------------------------- Vitals Details Patient Name: Date of Service: Martin [name]. 03/03/2020 9:30 A M Medical Record Number: [id number] Patient Account Number: [id number] Date of Birth/Sex: Treating RN: August 13, 1945 (74 y.o. Martin [name] Primary Care Martin [name]: Martin [name] Other Clinician: Mikeal [name] Referring Martin [name]: Treating Martin [name]/Extender: Martin [name] in Treatment: 10 Vital Signs Time Taken: 09:25 Temperature (F): 97.7 Height (in): 72 Pulse (bpm): 86 Weight (lbs): 270 Respiratory Rate (breaths/min): 18 Body Mass Index (BMI): 36.6 Blood Pressure (mmHg): 186/76 Capillary Blood Glucose (mg/dl): 163 Reference Range: 80 - 120 mg / dl Electronic Signature(s) Signed: 03/03/2020  3:21:52 PM By: Martin [name] EMT/HBOT/SD Entered By: Martin [name] on 03/03/2020 10:34:49

## 2020-03-06 ENCOUNTER — Encounter (HOSPITAL_BASED_OUTPATIENT_CLINIC_OR_DEPARTMENT_OTHER): Payer: PPO | Admitting: Internal Medicine

## 2020-03-06 ENCOUNTER — Other Ambulatory Visit: Payer: Self-pay

## 2020-03-06 DIAGNOSIS — L97514 Non-pressure chronic ulcer of other part of right foot with necrosis of bone: Secondary | ICD-10-CM | POA: Diagnosis not present

## 2020-03-06 DIAGNOSIS — E11621 Type 2 diabetes mellitus with foot ulcer: Secondary | ICD-10-CM | POA: Diagnosis not present

## 2020-03-06 LAB — GLUCOSE, CAPILLARY
Glucose-Capillary: 166 mg/dL — ABNORMAL HIGH (ref 70–99)
Glucose-Capillary: 150 mg/dL — ABNORMAL HIGH (ref 70–99)

## 2020-03-06 NOTE — Progress Notes (Signed)
DALAN, COWGER ([id number]) Visit Report for 03/06/2020 Arrival Information Details Patient Name: Date of Service: Martin [name], Martin [name] 03/06/2020 8:00 A M Medical Record Number: [id number] Patient Account Number: [id number] Date of Birth/Sex: Treating RN: August 09, 1945 (74 y.o. Martin [name], Martin [name] Primary Care Elijahjames Fuelling: [name] Other Clinician: Carlene Coria Referring [name] Cuff: Treating Karinda Cabriales/Extender: [name] in Treatment: 10 Visit Information History Since Last Visit Added or deleted any medications: No Patient Arrived: Martin [name] Any new allergies or adverse reactions: No Arrival Time: 08:00 Had a fall or experienced change in No Accompanied By: self activities of daily living that may affect Transfer Assistance: None risk of falls: Patient Identification Verified: Yes Signs or symptoms of abuse/neglect since last visito No Secondary Verification Process Completed: Yes Hospitalized since last visit: No Patient Requires Transmission-Based Precautions: No Implantable device outside of the clinic excluding No Patient Has Alerts: No cellular tissue based products placed in the center since last visit: Pain Present Now: No Electronic Signature(s) Signed: 03/06/2020 3:37:33 PM By: [name] EMT/HBOT/SD Entered By: [name] on 03/06/2020 09:03:00 -------------------------------------------------------------------------------- Encounter Discharge Information Details Patient Name: Date of Service: Martin Agee. 03/06/2020 8:00 A M Medical Record Number: [id number] Patient Account Number: [id number] Date of Birth/Sex: Treating RN: 1945/07/13 (74 y.o. Martin [name] Primary Care Meghin Thivierge: [name] Other Clinician: [name] Referring Essam Lowdermilk: Treating Treva Huyett/Extender: [name] in Treatment: 10 Encounter Discharge Information Items Discharge Condition: Stable Ambulatory Status: Cane Discharge  Destination: Home Transportation: Private Auto Accompanied By: self Schedule Follow-up Appointment: Yes Clinical Summary of Care: Patient Declined Electronic Signature(s) Signed: 03/06/2020 3:37:33 PM By: [name] EMT/HBOT/SD Entered By: [name] on 03/06/2020 10:45:03 -------------------------------------------------------------------------------- Patient/Caregiver Education Details Patient Name: Date of Service: Martin [name], [name] 11/8/2021andnbsp8:00 [place] Record Number: [id number] Patient Account Number: [id number] Date of Birth/Gender: Treating RN: 01-16-1946 (74 y.o. Martin [name] Primary Care Physician: [name] Other Clinician: [name] Referring Physician: Treating Physician/Extender: [name] in Treatment: 10 Education Assessment Education Provided To: Patient Education Topics Provided Hyperbaric Oxygenation: Methods: Explain/Verbal Responses: State content correctly Electronic Signature(s) Signed: 03/06/2020 3:37:33 PM By: [name] EMT/HBOT/SD Entered By: [name] on 03/06/2020 10:44:47 -------------------------------------------------------------------------------- Vitals Details Patient Name: Date of Service: Martin Agee. 03/06/2020 8:00 A M Medical Record Number: [id number] Patient Account Number: [id number] Date of Birth/Sex: Treating RN: Sep 28, 1945 (74 y.o. Martin [name] Primary Care Ayaansh Smail: [name] Other Clinician: [name] Referring Jamisen Duerson: Treating Shomari Matusik/Extender: [name] in Treatment: 10 Vital Signs Time Taken: 08:05 Temperature (F): 98.4 Height (in): 72 Pulse (bpm): 71 Weight (lbs): 270 Respiratory Rate (breaths/min): 18 Body Mass Index (BMI): 36.6 Blood Pressure (mmHg): 178/76 Capillary Blood Glucose (mg/dl): 166 Reference Range: 80 - 120 mg / dl Electronic Signature(s) Signed: 03/06/2020 3:37:33 PM By: [name] EMT/HBOT/SD Entered By: [name] on 03/06/2020 09:03:14

## 2020-03-06 NOTE — Progress Notes (Addendum)
Martin [name], Martin [name] ([id number]) Visit Report for 03/06/2020 HBO Details Patient Name: Date of Service: Martin [name], Martin [name] 03/06/2020 8:00 A M Medical Record Number: [id number] Patient Account Number: [id number] Date of Birth/Sex: Treating RN: 1945/10/17 (74 y.o. Martin [name] Primary Care Martin [name]: Martin [name] Other Clinician: Mikeal [name] Referring Martin [name]: Treating Martin [name]/Extender: Martin [name] in Treatment: 10 HBO Treatment Course Details Treatment Course Number: 1 Ordering Starletta Houchin: Worthy Keeler T Treatments Ordered: otal 40 HBO Treatment Start Date: 02/22/2020 HBO Indication: Diabetic Ulcer(s) of the Lower Extremity HBO Treatment Details Treatment Number: 9 Patient Type: Outpatient Chamber Type: Monoplace Chamber Serial #: [id number] Treatment Protocol: 2.0 ATA with 90 minutes oxygen, and no air breaks Treatment Details Compression Rate Down: 2.0 psi / minute De-Compression Rate Up: 2.0 psi / minute Air breaks and breathing Decompress Decompress Compress Tx Pressure Begins Reached periods Begins Ends (leave unused spaces blank) Chamber Pressure (ATA 1 2 ------2 1 ) Clock Time (24 hr) 08:17 08:25 - - - - - - 09:55 10:03 Treatment Length: 106 (minutes) Treatment Segments: 4 Vital Signs Capillary Blood Glucose Reference Range: 80 - 120 mg / dl HBO Diabetic Blood Glucose Intervention Range: <131 mg/dl or >249 mg/dl Time Vitals Blood Respiratory Capillary Blood Glucose Pulse Action Type: Pulse: Temperature: Taken: Pressure: Rate: Glucose (mg/dl): Meter #: Oximetry (%) Taken: Pre 08:05 178/76 71 18 98.4 166 Post 10:05 160/83 68 14 98.5 150 Treatment Response Treatment Toleration: Well Treatment Completion Status: Treatment Completed without Adverse Event Additional Procedure Documentation Tissue Sevierity: Necrosis of bone Kendarius Vigen Notes No concerns with treatment given Physician HBO Attestation: I certify that I supervised  this HBO treatment in accordance with Medicare guidelines. A trained emergency response team is readily available per Yes hospital policies and procedures. Continue HBOT as ordered. Yes Electronic Signature(s) Signed: 03/07/2020 3:11:24 PM By: Linton Ham MD Previous Signature: 03/06/2020 3:37:33 PM Version By: Martin [name] EMT/HBOT/SD Previous Signature: 03/06/2020 3:37:33 PM Version By: Martin [name] EMT/HBOT/SD Entered By: Linton Ham on 03/06/2020 17:47:46 -------------------------------------------------------------------------------- HBO Safety Checklist Details Patient Name: Date of Service: Martin [name]. 03/06/2020 8:00 A M Medical Record Number: [id number] Patient Account Number: [id number] Date of Birth/Sex: Treating RN: 1945-05-20 (74 y.o. Martin [name] Primary Care Martin [name]: Martin [name] Other Clinician: Mikeal [name] Referring Martin [name]: Treating Adea [name]/Extender: Martin [name] in Treatment: 10 HBO Safety Checklist Items Safety Checklist Consent Form Signed Patient voided / foley secured and emptied When did you last eato n/a Last dose of injectable or oral agent insulin Ostomy pouch emptied and vented if applicable NA All implantable devices assessed, documented and approved NA Intravenous access site secured and place NA Valuables secured Linens and cotton and cotton/polyester blend (less than 51% polyester) Personal oil-based products / skin lotions / body lotions removed Wigs or hairpieces removed NA Smoking or tobacco materials removed NA Books / newspapers / magazines / loose paper removed Cologne, aftershave, perfume and deodorant removed Jewelry removed (may wrap wedding band) Make-up removed NA Hair care products removed Battery operated devices (external) removed NA Heating patches and chemical warmers removed NA Titanium eyewear removed NA Nail polish cured greater than 10 hours NA Casting  material cured greater than 10 hours Hearing aids removed NA Loose dentures or partials removed NA Prosthetics have been removed NA Patient demonstrates correct use of air break device (if applicable) Patient concerns have been addressed Patient grounding bracelet on and cord attached to chamber Specifics for Inpatients (complete in addition to above) Medication sheet sent with  patient Intravenous medications needed or due during therapy sent with patient Drainage tubes (e.g. nasogastric tube or chest tube secured and vented) Endotracheal or Tracheotomy tube secured Cuff deflated of air and inflated with saline Airway suctioned Electronic Signature(s) Signed: 03/06/2020 9:03:50 AM By: Martin [name] EMT/HBOT/SD Entered By: Martin [name] on 03/06/2020 09:03:49

## 2020-03-06 NOTE — Progress Notes (Signed)
APRIL, CARLYON ([id number]) Visit Report for 03/03/2020 SuperBill Details Patient Name: Date of Service: BATU, CASSIN 03/03/2020 Medical Record Number: [id number] Patient Account Number: [id number] Date of Birth/Sex: Treating RN: 10-13-1945 (74 y.o. 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

## 2020-03-07 ENCOUNTER — Other Ambulatory Visit: Payer: Self-pay

## 2020-03-07 ENCOUNTER — Encounter (HOSPITAL_BASED_OUTPATIENT_CLINIC_OR_DEPARTMENT_OTHER): Payer: PPO | Admitting: Internal Medicine

## 2020-03-07 DIAGNOSIS — E11621 Type 2 diabetes mellitus with foot ulcer: Secondary | ICD-10-CM | POA: Diagnosis not present

## 2020-03-07 DIAGNOSIS — L97514 Non-pressure chronic ulcer of other part of right foot with necrosis of bone: Secondary | ICD-10-CM | POA: Diagnosis not present

## 2020-03-07 LAB — GLUCOSE, CAPILLARY
Glucose-Capillary: 212 mg/dL — ABNORMAL HIGH (ref 70–99)
Glucose-Capillary: 151 mg/dL — ABNORMAL HIGH (ref 70–99)

## 2020-03-07 NOTE — Progress Notes (Signed)
TRINDON, DORTON ([id number]) Visit Report for 03/07/2020 Arrival Information Details Patient Name: Date of Service: KAESEN, RODRIGUEZ 03/07/2020 8:00 A M Medical Record Number: [id number] Patient Account Number: [id number] Date of Birth/Sex: Treating RN: 1945/05/13 (74 y.o. [name]) Dolores Lory, Morey Hummingbird Primary Care Jalen Daluz: [name] Other Clinician: [name] Referring Jia Mohamed: Treating Omer Puccinelli/Extender: [name] in Treatment: 10 Visit Information History Since Last Visit Added or deleted any medications: No Patient Arrived: [name] Any new allergies or adverse reactions: No Arrival Time: 08:30 Had a fall or experienced change in No Accompanied By: self activities of daily living that may affect Transfer Assistance: None risk of falls: Patient Identification Verified: Yes Signs or symptoms of abuse/neglect since last visito No Secondary Verification Process Completed: Yes Hospitalized since last visit: No Patient Requires Transmission-Based Precautions: No Implantable device outside of the clinic excluding No Patient Has Alerts: No cellular tissue based products placed in the center since last visit: Pain Present Now: No Electronic Signature(s) Signed: 03/07/2020 4:01:02 PM By: [name] EMT/HBOT/SD Entered By: [name] on 03/07/2020 08:51:49 -------------------------------------------------------------------------------- Encounter Discharge Information Details Patient Name: Date of Service: [name] 03/07/2020 8:00 A M Medical Record Number: [id number] Patient Account Number: [id number] Date of Birth/Sex: Treating RN: January 17, 1946 (74 y.o. [name]) Carlene Coria Primary Care Junie Avilla: [name] Other Clinician: [name] Referring Mashayla Lavin: Treating Nyomi Howser/Extender: [name] in Treatment: 10 Encounter Discharge Information Items Discharge Condition: Stable Ambulatory Status: [place] Discharge  Destination: Home Transportation: Private Auto Accompanied By: self Schedule Follow-up Appointment: Yes Clinical Summary of Care: Patient Declined Electronic Signature(s) Signed: 03/07/2020 4:01:02 PM By: [name] EMT/HBOT/SD Entered By: [name] on 03/07/2020 10:52:37 -------------------------------------------------------------------------------- Patient/Caregiver Education Details Patient Name: Date of Service: [name] 11/9/2021andnbsp8:00 A M Medical Record Number: [id number] Patient Account Number: [id number] Date of Birth/Gender: Treating RN: [birth year] (74 y.o. [name]) Carlene Coria Primary Care Physician: [name] Other Clinician: [name] Referring Physician: Treating Physician/Extender: [name] in Treatment: 10 Education Assessment Education Provided To: Patient Education Topics Provided Hyperbaric Oxygenation: Methods: Explain/Verbal Responses: State content correctly Electronic Signature(s) Signed: 03/07/2020 4:01:02 PM By: [name] EMT/HBOT/SD Entered By: [name] on 03/07/2020 10:52:25 -------------------------------------------------------------------------------- Vitals Details Patient Name: Date of Service: [name] 03/07/2020 8:00 A M Medical Record Number: [id number] Patient Account Number: [id number] Date of Birth/Sex: Treating RN: [birth year] (74 y.o. [name]) Carlene Coria Primary Care Sravya Grissom: [name] Other Clinician: [name] Referring Yehoshua Vitelli: Treating Kazmir Oki/Extender: [name] in Treatment: 10 Vital Signs Time Taken: 08:05 Temperature (F): 98.2 Height (in): 72 Pulse (bpm): 75 Weight (lbs): 270 Respiratory Rate (breaths/min): 16 Body Mass Index (BMI): 36.6 Blood Pressure (mmHg): 175/81 Capillary Blood Glucose (mg/dl): 212 Reference Range: 80 - 120 mg / dl Electronic Signature(s) Signed: 03/07/2020 4:01:02 PM By: [name] EMT/HBOT/SD Entered By: [name] on 03/07/2020 08:52:14

## 2020-03-07 NOTE — Progress Notes (Signed)
[name], Martin [name] ([id number]) Visit Report for 03/06/2020 SuperBill Details [name] Name: Date of Service: [name], [name] 03/06/2020 Medical Record Number: [id number] [name] Account Number: [id number] Date of Birth/Sex: Treating RN: December 14, 1945 (74 y.o. 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 I10 Essential (primary) hypertension I25.10 Atherosclerotic heart disease of native coronary artery without angina pectoris M86.272 Subacute osteomyelitis, left ankle Martin foot Facility Procedures CPT4 Code Description Modifier Quantity [id number] G0277-(Facility Use Only) HBOT full body chamber, 66min , 4 Physician Procedures Quantity CPT4 Code Description Modifier [id number] 70017 - WC PHYS HYPERBARIC OXYGEN THERAPY 1 ICD-10 Diagnosis Description E11.621 Type 2 diabetes mellitus with foot ulcer Electronic Signature(s) Signed: 03/06/2020 3:37:33 PM By: Martin [name] EMT/HBOT/SD Signed: 03/07/2020 3:11:24 PM By: Martin [name] Entered By: Martin [name] on 03/06/2020 10:44:29

## 2020-03-07 NOTE — Progress Notes (Addendum)
CHANCELER, PULLIN ([id number]) Visit Report for 03/07/2020 HBO Details Patient Name: Date of Service: KRESTON, AHRENDT 03/07/2020 8:00 A M Medical Record Number: [id number] Patient Account Number: [id number] Date of Birth/Sex: Treating RN: 21-Sep-1945 (74 y.o. [name]) Carlene Coria Primary Care Najia Hurlbutt: [name] Other Clinician: [name] Referring Florette Thai: Treating Karissa Meenan/Extender: [name] in Treatment: 10 HBO Treatment Course Details Treatment Course Number: 1 Ordering Lyanne Kates: Worthy Keeler T Treatments Ordered: otal 40 HBO Treatment Start Date: 02/22/2020 HBO Indication: Diabetic Ulcer(s) of the Lower Extremity HBO Treatment Details Treatment Number: 10 Patient Type: Outpatient Chamber Type: Monoplace Chamber Serial #: [id number] Treatment Protocol: 2.0 ATA with 90 minutes oxygen, and no air breaks Treatment Details Compression Rate Down: 2.0 psi / minute De-Compression Rate Up: 2.0 psi / minute Air breaks and breathing Decompress Decompress Compress Tx Pressure Begins Reached periods Begins Ends (leave unused spaces blank) Chamber Pressure (ATA 1 2 ------2 1 ) Clock Time (24 hr) 08:48 08:56 - - - - - - 10:26 10:34 Treatment Length: 106 (minutes) Treatment Segments: 4 Vital Signs Capillary Blood Glucose Reference Range: 80 - 120 mg / dl HBO Diabetic Blood Glucose Intervention Range: <131 mg/dl or >249 mg/dl Time Vitals Blood Respiratory Capillary Blood Glucose Pulse Action Type: Pulse: Temperature: Taken: Pressure: Rate: Glucose (mg/dl): Meter #: Oximetry (%) Taken: Pre 08:05 175/81 75 16 98.2 212 Post 10:37 155/77 66 15 98.4 151 Treatment Response Treatment Toleration: Well Treatment Completion Status: Treatment Completed without Adverse Event Additional Procedure Documentation Tissue Sevierity: Necrosis of bone Rodgers Likes Notes No concerns with treatment given Physician HBO Attestation: I certify that I supervised  this HBO treatment in accordance with Medicare guidelines. A trained emergency response team is readily available per Yes hospital policies and procedures. Continue HBOT as ordered. Yes Electronic Signature(s) Signed: 03/08/2020 9:36:19 AM By: Linton Ham MD Previous Signature: 03/07/2020 4:01:02 PM Version By: [name] EMT/HBOT/SD Previous Signature: 03/07/2020 4:01:02 PM Version By: [name] EMT/HBOT/SD Entered By: Linton Ham on 03/08/2020 07:57:26 -------------------------------------------------------------------------------- HBO Safety Checklist Details Patient Name: Date of Service: [name] 03/07/2020 8:00 A M Medical Record Number: [id number] Patient Account Number: [id number] Date of Birth/Sex: Treating RN: [birth year] (74 y.o. [name]) Carlene Coria Primary Care Khristian Seals: [name] Other Clinician: [name] Referring Kaylena Pacifico: Treating Jamyrah Saur/Extender: [name] in Treatment: 10 HBO Safety Checklist Items Safety Checklist Consent Form Signed Patient voided / foley secured and emptied When did you last eato 0700 - cereal/banana Last dose of injectable or oral agent insulin Ostomy pouch emptied and vented if applicable NA All implantable devices assessed, documented and approved NA Intravenous access site secured and place NA Valuables secured Linens and cotton and cotton/polyester blend (less than 51% polyester) Personal oil-based products / skin lotions / body lotions removed Wigs or hairpieces removed NA Smoking or tobacco materials removed NA Books / newspapers / magazines / loose paper removed Cologne, aftershave, perfume and deodorant removed Jewelry removed (may wrap wedding band) Make-up removed NA Hair care products removed Battery operated devices (external) removed NA Heating patches and chemical warmers removed NA Titanium eyewear removed NA Nail polish cured greater than 10  hours NA Casting material cured greater than 10 hours Hearing aids removed NA Loose dentures or partials removed NA Prosthetics have been removed NA Patient demonstrates correct use of air break device (if applicable) Patient concerns have been addressed Patient grounding bracelet on and cord attached to chamber Specifics for Inpatients (complete in addition to above) Medication sheet  sent with patient Intravenous medications needed or due during therapy sent with patient Drainage tubes (e.g. nasogastric tube or chest tube secured and vented) Endotracheal or Tracheotomy tube secured Cuff deflated of air and inflated with saline Airway suctioned Electronic Signature(s) Signed: 03/07/2020 8:53:02 AM By: [name] EMT/HBOT/SD Entered By: [name] on 03/07/2020 08:53:01

## 2020-03-08 ENCOUNTER — Encounter (HOSPITAL_BASED_OUTPATIENT_CLINIC_OR_DEPARTMENT_OTHER): Payer: PPO | Admitting: Physician Assistant

## 2020-03-08 ENCOUNTER — Other Ambulatory Visit: Payer: Self-pay

## 2020-03-08 DIAGNOSIS — I2510 Atherosclerotic heart disease of native coronary artery without angina pectoris: Secondary | ICD-10-CM | POA: Diagnosis not present

## 2020-03-08 DIAGNOSIS — L97522 Non-pressure chronic ulcer of other part of left foot with fat layer exposed: Secondary | ICD-10-CM | POA: Diagnosis not present

## 2020-03-08 DIAGNOSIS — E11621 Type 2 diabetes mellitus with foot ulcer: Secondary | ICD-10-CM | POA: Diagnosis not present

## 2020-03-08 DIAGNOSIS — L97514 Non-pressure chronic ulcer of other part of right foot with necrosis of bone: Secondary | ICD-10-CM | POA: Diagnosis not present

## 2020-03-08 DIAGNOSIS — I10 Essential (primary) hypertension: Secondary | ICD-10-CM | POA: Diagnosis not present

## 2020-03-08 DIAGNOSIS — M86272 Subacute osteomyelitis, left ankle and foot: Secondary | ICD-10-CM | POA: Diagnosis not present

## 2020-03-08 DIAGNOSIS — E1169 Type 2 diabetes mellitus with other specified complication: Secondary | ICD-10-CM | POA: Diagnosis not present

## 2020-03-08 LAB — GLUCOSE, CAPILLARY
Glucose-Capillary: 149 mg/dL — ABNORMAL HIGH (ref 70–99)
Glucose-Capillary: 140 mg/dL — ABNORMAL HIGH (ref 70–99)

## 2020-03-08 NOTE — Progress Notes (Signed)
TRENTAN, TRIPPE ([id number]) Visit Report for 03/07/2020 SuperBill Details Patient Name: Date of Service: Martin [name], Martin [name] 03/07/2020 Medical Record Number: [id number] Patient Account Number: [id number] Date of Birth/Sex: Treating RN: 01-20-1946 (74 y.o. 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 I10 Essential (primary) hypertension I25.10 Atherosclerotic heart disease of native coronary artery without angina pectoris M86.272 Subacute osteomyelitis, left ankle and foot Facility Procedures CPT4 Code Description Modifier Quantity [id number] G0277-(Facility Use Only) HBOT full body chamber, 80min , 4 Physician Procedures Quantity CPT4 Code Description Modifier [id number] 85501 - WC PHYS HYPERBARIC OXYGEN THERAPY 1 ICD-10 Diagnosis Description E11.621 Type 2 diabetes mellitus with foot ulcer Electronic Signature(s) Signed: 03/07/2020 4:01:02 PM By: [name] EMT/HBOT/SD Signed: 03/08/2020 9:36:19 AM By: Linton Ham MD Entered By: [name] on 03/07/2020 10:52:10

## 2020-03-08 NOTE — Progress Notes (Signed)
BRAEDAN, MEUTH ([id number]) Visit Report for 03/08/2020 SuperBill Details Patient Name: Date of Service: KHRISTIAN, PHILLIPPI 03/08/2020 Medical Record Number: [id number] Patient Account Number: [id number] Date of Birth/Sex: Treating RN: [birth year] (74 y.o. 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

## 2020-03-08 NOTE — Progress Notes (Addendum)
CHANC, KERVIN ([id number]) Visit Report for 03/08/2020 HBO Details Patient Name: Date of Service: Martin [name], Martin [name] 03/08/2020 10:00 A M Medical Record Number: [id number] Patient Account Number: [id number] Date of Birth/Sex: Treating RN: 1945/07/03 (74 y.o. Martin [name] Primary Care Martin [name]: [name] Other Clinician: Mikeal [name] Referring Martin [name]: Treating Martin [name]/Extender: Martin [name] in Treatment: 11 HBO Treatment Course Details Treatment Course Number: 1 Ordering Martin [name]: Martin [name] T Treatments Ordered: otal 40 HBO Treatment Start Date: 02/22/2020 HBO Indication: Diabetic Ulcer(s) of the Lower Extremity HBO Treatment Details Treatment Number: 11 Patient Type: Outpatient Chamber Type: Monoplace Chamber Serial #: [id number] Treatment Protocol: 2.0 ATA with 90 minutes oxygen, and no air breaks Treatment Details Compression Rate Down: 2.0 psi / minute De-Compression Rate Up: 2.0 psi / minute Air breaks and breathing Decompress Decompress Compress Tx Pressure Begins Reached periods Begins Ends (leave unused spaces blank) Chamber Pressure (ATA 1 2 ------2 1 ) Clock Time (24 hr) 10:08 10:16 - - - - - - 11:46 11:54 Treatment Length: 106 (minutes) Treatment Segments: 4 Vital Signs Capillary Blood Glucose Reference Range: 80 - 120 mg / dl HBO Diabetic Blood Glucose Intervention Range: <131 mg/dl or >249 mg/dl Time Vitals Blood Respiratory Capillary Blood Glucose Pulse Action Type: Pulse: Temperature: Taken: Pressure: Rate: Glucose (mg/dl): Meter #: Oximetry (%) Taken: Pre 10:00 162/79 76 16 98.2 149 Post 11:57 157/72 68 16 98 140 Treatment Response Treatment Toleration: Well Treatment Completion Status: Treatment Completed without Adverse Event Additional Procedure Documentation Tissue Sevierity: Necrosis of bone Electronic Signature(s) Signed: 03/08/2020 4:38:26 PM By: Martin [name] EMT/HBOT/SD Signed:  03/08/2020 5:47:40 PM By: Martin Keeler PA-C Entered By: Martin [name] on 03/08/2020 13:51:13 -------------------------------------------------------------------------------- HBO Safety Checklist Details Patient Name: Date of Service: Martin Agee. 03/08/2020 10:00 A M Medical Record Number: [id number] Patient Account Number: [id number] Date of Birth/Sex: Treating RN: [birth year] (74 y.o. Martin [name] Primary Care Martin [name]: [name] Other Clinician: Mikeal [name] Referring Martin [name]: Treating Martin [name]/Extender: Martin [name] in Treatment: 11 HBO Safety Checklist Items Safety Checklist Consent Form Signed Patient voided / foley secured and emptied When did you last eato n/a Last dose of injectable or oral agent insulin Ostomy pouch emptied and vented if applicable NA All implantable devices assessed, documented and approved NA Intravenous access site secured and place NA Valuables secured Linens and cotton and cotton/polyester blend (less than 51% polyester) Personal oil-based products / skin lotions / body lotions removed Wigs or hairpieces removed NA Smoking or tobacco materials removed NA Books / newspapers / magazines / loose paper removed Cologne, aftershave, perfume and deodorant removed Jewelry removed (may wrap wedding band) Make-up removed NA Hair care products removed Battery operated devices (external) removed NA Heating patches and chemical warmers removed NA Titanium eyewear removed NA Nail polish cured greater than 10 hours NA Casting material cured greater than 10 hours NA Hearing aids removed NA Loose dentures or partials removed NA Prosthetics have been removed NA Patient demonstrates correct use of air break device (if applicable) Patient concerns have been addressed Patient grounding bracelet on and cord attached to chamber Specifics for Inpatients (complete in addition to above) Medication sheet  sent with patient Intravenous medications needed or due during therapy sent with patient Drainage tubes (e.g. nasogastric tube or chest tube secured and vented) Endotracheal or Tracheotomy tube secured Cuff deflated of air and inflated with saline Airway suctioned Electronic Signature(s) Signed: 03/08/2020 10:36:37 AM By: Martin [name] EMT/HBOT/SD Entered By: Martin [name],  Martin [name] on 03/08/2020 10:36:37

## 2020-03-08 NOTE — Progress Notes (Signed)
[name], [name] ([id number]) Visit Report for 03/08/2020 Arrival Information Details Patient Name: Date of Service: Martin [name], Martin [name] 03/08/2020 10:00 A M Medical Record Number: [id number] Patient Account Number: [id number] Date of Birth/Sex: Treating RN: 07-22-1945 (74 y.o. Martin [name], [name] Primary Care Puneet Masoner: [name] Other Clinician: [name] Referring Jasmen Emrich: Treating Yahmir Sokolov/Extender: Sandria Bales in Treatment: 11 Visit Information History Since Last Visit Added or deleted any medications: No Patient Arrived: Martin [name] Any new allergies or adverse reactions: No Arrival Time: 09:55 Had a fall or experienced change in No Accompanied By: self activities of daily living that may affect Transfer Assistance: None risk of falls: Patient Identification Verified: Yes Signs or symptoms of abuse/neglect since last visito No Secondary Verification Process Completed: Yes Hospitalized since last visit: No Patient Requires Transmission-Based Precautions: No Implantable device outside of the clinic excluding No Patient Has Alerts: No cellular tissue based products placed in the center since last visit: Pain Present Now: No Electronic Signature(s) Signed: 03/08/2020 4:38:26 PM By: [name] EMT/HBOT/SD Entered By: [name] on 03/08/2020 10:35:32 -------------------------------------------------------------------------------- Encounter Discharge Information Details Patient Name: Date of Service: Martin Agee. 03/08/2020 10:00 A M Medical Record Number: [id number] Patient Account Number: [id number] Date of Birth/Sex: Treating RN: 28-Aug-1945 (74 y.o. Martin [name] Primary Care Landen Knoedler: [name] Other Clinician: [name] Referring Florella Mcneese: Treating Zelie Asbill/Extender: Sandria Bales in Treatment: 11 Encounter Discharge Information Items Discharge Condition: Stable Ambulatory Status:  Cane Discharge Destination: Home Transportation: Private Auto Accompanied By: self Schedule Follow-up Appointment: Yes Clinical Summary of Care: Patient Declined Electronic Signature(s) Signed: 03/08/2020 4:38:26 PM By: [name] EMT/HBOT/SD Entered By: [name] on 03/08/2020 13:51:48 -------------------------------------------------------------------------------- Patient/Caregiver Education Details Patient Name: Date of Service: Martin [name], [name] 11/10/2021andnbsp10:00 A M Medical Record Number: [id number] Patient Account Number: [id number] Date of Birth/Gender: Treating RN: [birth year] (74 y.o. Martin [name] Primary Care Physician: [name] Other Clinician: [name] Referring Physician: Treating Physician/Extender: Sandria Bales in Treatment: 11 Education Assessment Education Provided To: Patient Education Topics Provided Hyperbaric Oxygenation: Methods: Explain/Verbal Responses: State content correctly [employer]) Signed: 03/08/2020 4:38:26 PM By: [name] EMT/HBOT/SD Entered By: [name] on 03/08/2020 13:51:34 -------------------------------------------------------------------------------- Vitals Details Patient Name: Date of Service: Martin Agee. 03/08/2020 10:00 A M Medical Record Number: [id number] Patient Account Number: [id number] Date of Birth/Sex: Treating RN: Sep 19, 1945 (74 y.o. Martin [name] Primary Care Hazely Sealey: [name] Other Clinician: [name] Referring Kathia Covington: Treating Eilis Chestnutt/Extender: Sandria Bales in Treatment: 11 Vital Signs Time Taken: 10:00 Temperature (F): 98.2 Height (in): 72 Pulse (bpm): 76 Weight (lbs): 270 Respiratory Rate (breaths/min): 16 Body Mass Index (BMI): 36.6 Blood Pressure (mmHg): 162/79 Capillary Blood Glucose (mg/dl): 149 Reference Range: 80 - 120 mg / dl Electronic Signature(s) Signed:  03/08/2020 4:38:26 PM By: [name] EMT/HBOT/SD Entered By: [name] on 03/08/2020 10:35:57

## 2020-03-08 NOTE — Progress Notes (Addendum)
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 cast given some mobility I do not want to overdo it. 3. With regard to the antibiotics he will complete  what he currently has when he is done with that we will probably transition him away from oral antibiotics at that point. We will see patient back for reevaluation in 1 week here in the clinic. If anything worsens or changes patient will contact our office for additional recommendations. Electronic Signature(s) Signed: 03/08/2020 9:40:26 AM By: Worthy Keeler PA-C Entered By: Worthy Keeler on 03/08/2020 09:40:26 -------------------------------------------------------------------------------- Total Contact Cast Details Patient Name: Date of Service: Martin [name], Martin [name] 03/08/2020 9:00 A M Medical Record Number: [id number] Patient Account Number: [id number] Date of Birth/Sex: Treating RN: December 07, 1945 (74 y.o. Martin [name] Primary Care Provider: [name] Other Clinician: Referring Provider: Treating Provider/Extender: Sandria Bales in Treatment: 11 T Contact Cast Applied for Wound Assessment: otal Wound #5 Left Metatarsal head fifth Performed By: Physician Worthy Keeler, PA Post Procedure Diagnosis Same as Pre-procedure Electronic Signature(s) Signed: 03/08/2020 5:47:40 PM By: Worthy Keeler PA-C Signed: 03/08/2020 5:57:36 PM By: Baruch Gouty RN, BSN Entered By: Baruch Gouty on 03/08/2020 09:27:12 -------------------------------------------------------------------------------- SuperBill Details Patient Name: Date of Service: Martin [name], Martin [name] 03/08/2020 Medical Record Number: [id number] Patient Account Number: [id number] Date of Birth/Sex: Treating RN: 09-25-1945 (74 y.o. 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, left ankle and  foot Facility Procedures CPT4 Code: [id number] Description: [phone] - APPLY TOTAL CONTACT LEG CAST ICD-10 Diagnosis Description L97.522 Non-pressure chronic ulcer of other part of left foot with fat layer exposed Modifier: Quantity: 1 Physician Procedures : CPT4 Code Description Modifier [id number] - WC PHYS APPLY TOTAL CONTACT CAST ICD-10 Diagnosis Description L97.522 Non-pressure chronic ulcer of other part of left foot with fat layer exposed Quantity: 1 Electronic Signature(s) Signed: 03/08/2020 9:40:33 AM By: Worthy Keeler PA-C Entered By: Worthy Keeler on 03/08/2020 09:40:32

## 2020-03-09 ENCOUNTER — Encounter (HOSPITAL_BASED_OUTPATIENT_CLINIC_OR_DEPARTMENT_OTHER): Payer: PPO | Admitting: Internal Medicine

## 2020-03-09 ENCOUNTER — Other Ambulatory Visit: Payer: Self-pay

## 2020-03-09 DIAGNOSIS — L97524 Non-pressure chronic ulcer of other part of left foot with necrosis of bone: Secondary | ICD-10-CM | POA: Diagnosis not present

## 2020-03-09 DIAGNOSIS — E11621 Type 2 diabetes mellitus with foot ulcer: Secondary | ICD-10-CM | POA: Diagnosis not present

## 2020-03-09 LAB — GLUCOSE, CAPILLARY
Glucose-Capillary: 205 mg/dL — ABNORMAL HIGH (ref 70–99)
Glucose-Capillary: 198 mg/dL — ABNORMAL HIGH (ref 70–99)

## 2020-03-09 NOTE — Progress Notes (Signed)
Martin [name], Martin [name] ([id number]) Visit Report for 03/09/2020 SuperBill Details Patient Name: Date of Service: Martin [name], Martin [name] 03/09/2020 Medical Record Number: [id number] Patient Account Number: [id number] Date of Birth/Sex: Treating RN: 07-05-1945 (74 y.o. 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 I10 Essential (primary) hypertension I25.10 Atherosclerotic heart disease of native coronary artery without angina pectoris M86.272 Subacute osteomyelitis, left ankle and foot Facility Procedures CPT4 Code Description Modifier Quantity [id number]-(Facility Use Only) HBOT full body chamber, 65min , 4 Physician Procedures Quantity CPT4 Code Description Modifier [id number] 75102 - WC PHYS HYPERBARIC OXYGEN THERAPY 1 ICD-10 Diagnosis Description E11.621 Type 2 diabetes mellitus with foot ulcer Electronic Signature(s) Signed: 03/09/2020 3:52:36 PM By: [name] EMT/HBOT/SD Signed: 03/09/2020 5:00:15 PM By: Linton Ham MD Entered By: [name] on 03/09/2020 10:12:07

## 2020-03-09 NOTE — Progress Notes (Signed)
Martin [name], Martin [name] ([id number]) Visit Report for 03/09/2020 Arrival Information Details Patient Name: Date of Service: Martin [name], Martin [name] 03/09/2020 8:00 A M Medical Record Number: [id number] Patient Account Number: [id number] Date of Birth/Sex: Treating RN: Nov 28, 1945 (74 y.o. Martin [name], Martin [name] Primary Care Cary Wilford: [name] Other Clinician: [name] Referring Seiya Silsby: Treating Ayyub Krall/Extender: [name] in Treatment: 11 Visit Information History Since Last Visit Added or deleted any medications: No Patient Arrived: [name] Any new allergies or adverse reactions: No Arrival Time: 07:45 Had a fall or experienced change in No Accompanied By: self activities of daily living that may affect Transfer Assistance: None risk of falls: Patient Identification Verified: Yes Signs or symptoms of abuse/neglect since last visito No Secondary Verification Process Completed: Yes Hospitalized since last visit: No Patient Requires Transmission-Based Precautions: No Implantable device outside of the clinic excluding No Patient Has Alerts: No cellular tissue based products placed in the center since last visit: Pain Present Now: No Electronic Signature(s) Signed: 03/09/2020 3:52:36 PM By: [name] EMT/HBOT/SD Entered By: [name] on 03/09/2020 07:55:50 -------------------------------------------------------------------------------- Encounter Discharge Information Details Patient Name: Date of Service: Martin Agee. 03/09/2020 8:00 A M Medical Record Number: [id number] Patient Account Number: [id number] Date of Birth/Sex: Treating RN: 29-Aug-1945 (74 y.o. Martin [name] Primary Care Ellenora Talton: [name] Other Clinician: [name] Referring Candido Flott: Treating Jigar Zielke/Extender: [name] in Treatment: 11 Encounter Discharge Information Items Discharge Condition: Stable Ambulatory Status:  Ambulatory Discharge Destination: Home Transportation: Private Auto Accompanied By: self Schedule Follow-up Appointment: Yes Clinical Summary of Care: Patient Declined Electronic Signature(s) Signed: 03/09/2020 3:52:36 PM By: [name] EMT/HBOT/SD Entered By: [name] on 03/09/2020 10:12:32 -------------------------------------------------------------------------------- Patient/Caregiver Education Details Patient Name: Date of Service: Martin [name], [name] 11/11/2021andnbsp8:00 A M Medical Record Number: [id number] Patient Account Number: [id number] Date of Birth/Gender: Treating RN: Jun 30, 1945 (74 y.o. Martin [name] Primary Care Physician: [name] Other Clinician: [name] Referring Physician: Treating Physician/Extender: [name] in Treatment: 11 Education Assessment Education Provided To: Patient Education Topics Provided Hyperbaric Oxygenation: Methods: Explain/Verbal Responses: State content correctly Electronic Signature(s) Signed: 03/09/2020 3:52:36 PM By: [name] EMT/HBOT/SD Entered By: [name] on 03/09/2020 10:12:20 -------------------------------------------------------------------------------- Vitals Details Patient Name: Date of Service: Martin Agee. 03/09/2020 8:00 A M Medical Record Number: [id number] Patient Account Number: [id number] Date of Birth/Sex: Treating RN: [birth year] (74 y.o. Martin [name], Martin [name] Primary Care Emiah Pellicano: [name] Other Clinician: [name] Referring Zykia Walla: Treating Jameek Bruntz/Extender: [name] in Treatment: 11 Vital Signs Time Taken: 07:50 Temperature (F): 98.2 Height (in): 72 Pulse (bpm): 75 Weight (lbs): 270 Respiratory Rate (breaths/min): 18 Body Mass Index (BMI): 36.6 Blood Pressure (mmHg): 151/70 Capillary Blood Glucose (mg/dl): 205 Reference Range: 80 - 120 mg / dl Electronic Signature(s) Signed:  03/09/2020 3:52:36 PM By: [name] EMT/HBOT/SD Entered By: [name] on 03/09/2020 07:56:15

## 2020-03-09 NOTE — Progress Notes (Addendum)
[name], [name] ([id number]) Visit Report for 03/09/2020 HBO Details Patient Name: Date of Service: Martin, [name] 03/09/2020 8:00 A M Medical Record Number: [id number] Patient Account Number: [id number] Date of Birth/Sex: Treating RN: 04-22-1946 (74 y.o. Martin [name], Martin [name] Primary Care Martin [name]: Martin [name] Other Clinician: Mikeal [name] Referring Martin [name]: Treating Martin [name]/Extender: Martin [name] in Treatment: 11 HBO Treatment Course Details Treatment Course Number: 1 Ordering Martin [name]: Martin [name] T Treatments Ordered: otal 40 HBO Treatment Start Date: 02/22/2020 HBO Indication: Diabetic Ulcer(s) of the Lower Extremity HBO Treatment Details Treatment Number: 12 Patient Type: Outpatient Chamber Type: Monoplace Chamber Serial #: [id number] Treatment Protocol: 2.0 ATA with 90 minutes oxygen, and no air breaks Treatment Details Compression Rate Down: 2.0 psi / minute De-Compression Rate Up: 2.0 psi / minute Air breaks and breathing Decompress Decompress Compress Tx Pressure Begins Reached periods Begins Ends (leave unused spaces blank) Chamber Pressure (ATA 1 2 ------2 1 ) Clock Time (24 hr) 08:02 08:10 - - - - - - 09:40 09:48 Treatment Length: 106 (minutes) Treatment Segments: 4 Vital Signs Capillary Blood Glucose Reference Range: 80 - 120 mg / dl HBO Diabetic Blood Glucose Intervention Range: <131 mg/dl or >249 mg/dl Time Vitals Blood Respiratory Capillary Blood Glucose Pulse Action Type: Pulse: Temperature: Taken: Pressure: Rate: Glucose (mg/dl): Meter #: Oximetry (%) Taken: Pre 07:50 151/70 75 18 98.2 205 Post 09:50 146/75 68 15 98 198 Treatment Response Treatment Toleration: Well Treatment Completion Status: Treatment Completed without Adverse Event Additional Procedure Documentation Tissue Sevierity: Necrosis of bone Martin [name] Notes No concerns with treatment given Physician HBO Attestation: I certify that I supervised  this HBO treatment in accordance with Medicare guidelines. A trained emergency response team is readily available per Yes hospital policies and procedures. Continue HBOT as ordered. Yes Electronic Signature(s) Signed: 03/09/2020 5:00:15 PM By: Martin Ham MD Previous Signature: 03/09/2020 3:52:36 PM Version By: Martin [name] EMT/HBOT/SD Previous Signature: 03/09/2020 3:52:36 PM Version By: Martin [name] EMT/HBOT/SD Entered By: Martin [name] on 03/09/2020 16:15:48 -------------------------------------------------------------------------------- HBO Safety Checklist Details Patient Name: Date of Service: Martin [name]. 03/09/2020 8:00 A M Medical Record Number: [id number] Patient Account Number: [id number] Date of Birth/Sex: Treating RN: 1945-07-07 (74 y.o. Martin [name], Martin [name] Primary Care Sukanya Goldblatt: Martin [name] Other Clinician: Mikeal [name] Referring Dashawn Bartnick: Treating Lorri Fukuhara/Extender: Martin [name] in Treatment: 11 HBO Safety Checklist Items Safety Checklist Consent Form Signed Patient voided / foley secured and emptied When did you last eato 0700 - egg mcmuffin Last dose of injectable or oral agent insulin Ostomy pouch emptied and vented if applicable NA All implantable devices assessed, documented and approved NA Intravenous access site secured and place NA Valuables secured Linens and cotton and cotton/polyester blend (less than 51% polyester) Personal oil-based products / skin lotions / body lotions removed Wigs or hairpieces removed NA Smoking or tobacco materials removed NA Books / newspapers / magazines / loose paper removed Cologne, aftershave, perfume and deodorant removed Jewelry removed (may wrap wedding band) Make-up removed NA Hair care products removed Battery operated devices (external) removed NA Heating patches and chemical warmers removed NA Titanium eyewear removed NA Nail polish cured greater than 10  hours NA Casting material cured greater than 10 hours Hearing aids removed NA Loose dentures or partials removed NA Prosthetics have been removed NA Patient demonstrates correct use of air break device (if applicable) Patient concerns have been addressed Patient grounding bracelet on and cord attached to chamber Specifics for Inpatients (complete in addition to above) Medication  sheet sent with patient Intravenous medications needed or due during therapy sent with patient Drainage tubes (e.g. nasogastric tube or chest tube secured and vented) Endotracheal or Tracheotomy tube secured Cuff deflated of air and inflated with saline Airway suctioned Electronic Signature(s) Signed: 03/09/2020 7:57:02 AM By: Martin [name] EMT/HBOT/SD Entered By: Martin [name] on 03/09/2020 07:57:01

## 2020-03-09 NOTE — Progress Notes (Signed)
Martin [name], Martin [name] ([id number]) Visit Report for 03/08/2020 Arrival Information Details Patient Name: Date of Service: Martin [name], Martin [name] 03/08/2020 9:00 A M Medical Record Number: [id number] Patient Account Number: [id number] Date of Birth/Sex: Treating RN: Jul 25, 1945 (74 y.o. Martin [name] Primary Care Nigel Wessman: [name] Other Clinician: Referring Alisan Dokes: Treating Jerrian Mells/Extender: Sandria Bales in Treatment: 11 Visit Information History Since Last Visit Added or deleted any medications: No Patient Arrived: Martin [name] Any new allergies or adverse reactions: No Arrival Time: 08:57 Had a fall or experienced change in No Accompanied By: alone activities of daily living that may affect Transfer Assistance: None risk of falls: Patient Identification Verified: Yes Signs or symptoms of abuse/neglect since last visito No Secondary Verification Process Completed: Yes Hospitalized since last visit: No Patient Requires Transmission-Based Precautions: No Implantable device outside of the clinic excluding No Patient Has Alerts: No cellular tissue based products placed in the center since last visit: Has Dressing in Place as Prescribed: Yes Has Footwear/Offloading in Place as Prescribed: Yes Left: T Contact Cast otal Pain Present Now: No Electronic Signature(s) Signed: 03/09/2020 8:28:44 AM By: Martin Hurst RN, BSN Entered By: Martin [name] on 03/08/2020 08:57:47 -------------------------------------------------------------------------------- Encounter Discharge Information Details Patient Name: Date of Service: Martin Agee. 03/08/2020 9:00 A M Medical Record Number: [id number] Patient Account Number: [id number] Date of Birth/Sex: Treating RN: December 11, 1945 (74 y.o. Martin [name] Primary Care Yannick Steuber: [name] Other Clinician: Referring Ysenia Filice: Treating Martin [name]/Extender: Sandria Bales in Treatment: 11 Encounter  Discharge Information Items Discharge Condition: Stable Ambulatory Status: Cane Discharge Destination: Home Transportation: Private Auto Accompanied By: self Schedule Follow-up Appointment: Yes Clinical Summary of Care: Patient Declined Electronic Signature(s) Signed: 03/08/2020 5:51:14 PM By: Martin Coria RN Entered By: Martin [name] on 03/08/2020 09:55:16 -------------------------------------------------------------------------------- Lower Extremity Assessment Details Patient Name: Date of Service: Martin [name], Martin [name] 03/08/2020 9:00 A M Medical Record Number: [id number] Patient Account Number: [id number] Date of Birth/Sex: Treating RN: [birth year] (74 y.o. Martin [name] Primary Care Vanessa Kampf: [name] Other Clinician: Referring Martin [name]: Treating Martin [name]/Extender: Sandria Bales in Treatment: 11 Edema Assessment Assessed: Martin [name]: No] Martin [name]: No] Edema: [Left: Ye] [Right: s] Calf Left: Right: Point of Measurement: From Medial Instep 38.5 cm Ankle Left: Right: Point of Measurement: From Medial Instep 27 cm Vascular Assessment Pulses: Dorsalis Pedis Palpable: [Left:Yes] Electronic Signature(s) Signed: 03/09/2020 8:28:44 AM By: Martin Hurst RN, BSN Entered By: Martin [name] on 03/08/2020 09:04:38 -------------------------------------------------------------------------------- Multi-Disciplinary Care Plan Details Patient Name: Date of Service: Martin Agee. 03/08/2020 9:00 A M Medical Record Number: [id number] Patient Account Number: [id number] Date of Birth/Sex: Treating RN: Jan 11, 1946 (74 y.o. Martin [name] Primary Care Elira Colasanti: [name] Other Clinician: Referring Martin [name]: Treating Martin [name]/Extender: Sandria Bales in Treatment: 11 Active Inactive Nutrition Nursing Diagnoses: Impaired glucose control: actual or potential Potential for alteratiion in Nutrition/Potential for imbalanced  nutrition Goals: Patient/caregiver will maintain therapeutic glucose control Date Initiated: 12/22/2019 Target Resolution Date: 03/22/2020 Goal Status: Active Interventions: Assess HgA1c results as ordered upon admission and as needed Treatment Activities: Patient referred to Primary Care Physician for further nutritional evaluation : 12/22/2019 Notes: Wound/Skin Impairment Nursing Diagnoses: Impaired tissue integrity Knowledge deficit related to ulceration/compromised skin integrity Goals: Patient/caregiver will verbalize understanding of skin care regimen Date Initiated: 12/22/2019 Target Resolution Date: 03/15/2020 Goal Status: Active Ulcer/skin breakdown will have a volume reduction of 30% by week 4 Date Initiated: 12/22/2019 Date Inactivated: 01/19/2020 Target Resolution Date: 01/19/2020 Goal Status: Unmet Unmet Reason:  infection Ulcer/skin breakdown will have a volume reduction of 50% by week 8 Date Initiated: 01/19/2020 Date Inactivated: 02/16/2020 Target Resolution Date: 02/16/2020 Goal Status: Unmet Unmet Reason: infection Ulcer/skin breakdown will have a volume reduction of 80% by week 12 Date Initiated: 02/16/2020 Target Resolution Date: 03/15/2020 Goal Status: Active Interventions: Assess patient/caregiver ability to obtain necessary supplies Assess patient/caregiver ability to perform ulcer/skin care regimen upon admission and as needed Assess ulceration(s) every visit Provide education on ulcer and skin care Treatment Activities: Skin care regimen initiated : 12/22/2019 Topical wound management initiated : 12/22/2019 Notes: Electronic Signature(s) Signed: 03/08/2020 5:57:36 PM By: Martin Gouty RN, BSN Entered By: Martin [name] on 03/08/2020 09:26:27 -------------------------------------------------------------------------------- Pain Assessment Details Patient Name: Date of Service: Martin Agee. 03/08/2020 9:00 A M Medical Record Number:  [id number] Patient Account Number: [id number] Date of Birth/Sex: Treating RN: March 13, 1946 (74 y.o. Martin [name] Primary Care Oseias Horsey: [name] Other Clinician: Referring Jaydrien Wassenaar: Treating Resha Filippone/Extender: Sandria Bales in Treatment: 11 Active Problems Location of Pain Severity and Description of Pain Patient Has Paino No Site Locations Pain Management and Medication Current Pain Management: Electronic Signature(s) Signed: 03/09/2020 8:28:44 AM By: Martin Hurst RN, BSN Entered By: Martin [name] on 03/08/2020 08:58:16 -------------------------------------------------------------------------------- Patient/Caregiver Education Details Patient Name: Date of Service: Martin Agee 11/10/2021andnbsp9:00 A M Medical Record Number: [id number] Patient Account Number: [id number] Date of Birth/Gender: Treating RN: 06/14/1945 (74 y.o. Martin [name] Primary Care Physician: [name] Other Clinician: Referring Physician: Treating Physician/Extender: Sandria Bales in Treatment: 11 Education Assessment Education Provided To: Patient Education Topics Provided Offloading: Methods: Explain/Verbal Responses: Reinforcements needed, State content correctly Wound/Skin Impairment: Methods: Explain/Verbal Responses: Reinforcements needed, State content correctly Electronic Signature(s) Signed: 03/08/2020 5:57:36 PM By: Martin Gouty RN, BSN Entered By: Martin [name] on 03/08/2020 09:26:52 -------------------------------------------------------------------------------- Wound Assessment Details Patient Name: Date of Service: Martin Agee. 03/08/2020 9:00 A M Medical Record Number: [id number] Patient Account Number: [id number] Date of Birth/Sex: Treating RN: Apr 07, 1946 (74 y.o. Martin [name] Primary Care Raejean Swinford: [name] Other Clinician: Referring Kynslei Art: Treating Joshua Soulier/Extender: Sandria Bales in Treatment: 11 Wound Status Wound Number: 5 Primary Diabetic Wound/Ulcer of the Lower Extremity Etiology: Wound Location: Left Metatarsal head fifth Wound Open Wounding Event: Gradually Appeared Status: Date Acquired: 10/31/2019 Comorbid Cataracts, Coronary Artery Disease, Hypertension, Type II Weeks Of Treatment: 11 History: Diabetes, Neuropathy Clustered Wound: No Wound Measurements Length: (cm) 0.5 Width: (cm) 0.5 Depth: (cm) 0.3 Area: (cm) 0.196 Volume: (cm) 0.059 % Reduction in Area: 83.8% % Reduction in Volume: 93% Epithelialization: Medium (34-66%) Tunneling: No Undermining: No Wound Description Classification: Grade 3 Wound Margin: Epibole Exudate Amount: Small Exudate Type: Serous Exudate Color: amber Foul Odor After Cleansing: No Slough/Fibrino Yes Wound Bed Granulation Amount: Small (1-33%) Exposed Structure Granulation Quality: Pink, Pale Fascia Exposed: No Necrotic Amount: Large (67-100%) Fat Layer (Subcutaneous Tissue) Exposed: Yes Necrotic Quality: Adherent Slough Tendon Exposed: No Muscle Exposed: No Joint Exposed: No Bone Exposed: No Treatment Notes Wound #5 (Left Metatarsal head fifth) 1. Cleanse With Wound Cleanser Soap and water 3. Primary Dressing Applied Colle[employer] 4. Secondary Dressing Foam Drawtex 5. Secured With Tape 7. Footwear/Offloading device applied Removable Cast Walker/Walking Boot T Contact Cast otal Notes saline to moisten collagen , foam donut. extra padding to left foot. last layer of TCC applied by PA. Electronic Signature(s) Signed: 03/09/2020 8:28:44 AM By: Martin Hurst RN, BSN Entered By: Martin [name] on 03/08/2020 09:10:17 -------------------------------------------------------------------------------- Vitals Details Patient  Name: Date of Service: Martin [name], Martin [name] 03/08/2020 9:00 A M Medical Record Number: [id number] Patient Account Number: [id number] Date of Birth/Sex:  Treating RN: 07-23-1945 (74 y.o. Martin [name] Primary Care Dhanvi Boesen: [name] Other Clinician: Referring Jandiel Magallanes: Treating Chevi Lim/Extender: Sandria Bales in Treatment: 11 Vital Signs Time Taken: 08:57 Temperature (F): 98.2 Height (in): 72 Pulse (bpm): 76 Weight (lbs): 270 Respiratory Rate (breaths/min): 18 Body Mass Index (BMI): 36.6 Blood Pressure (mmHg): 162/79 Capillary Blood Glucose (mg/dl): 161 Reference Range: 80 - 120 mg / dl Notes glucose per pt report Electronic Signature(s) Signed: 03/09/2020 8:28:44 AM By: Martin Hurst RN, BSN Entered By: Martin [name] on 03/08/2020 08:58:09

## 2020-03-10 ENCOUNTER — Other Ambulatory Visit: Payer: Self-pay

## 2020-03-10 ENCOUNTER — Encounter (HOSPITAL_BASED_OUTPATIENT_CLINIC_OR_DEPARTMENT_OTHER): Payer: PPO | Admitting: Internal Medicine

## 2020-03-10 DIAGNOSIS — M86672 Other chronic osteomyelitis, left ankle and foot: Secondary | ICD-10-CM | POA: Diagnosis not present

## 2020-03-10 DIAGNOSIS — E11621 Type 2 diabetes mellitus with foot ulcer: Secondary | ICD-10-CM | POA: Diagnosis not present

## 2020-03-10 DIAGNOSIS — L97524 Non-pressure chronic ulcer of other part of left foot with necrosis of bone: Secondary | ICD-10-CM | POA: Diagnosis not present

## 2020-03-10 LAB — GLUCOSE, CAPILLARY
Glucose-Capillary: 155 mg/dL — ABNORMAL HIGH (ref 70–99)
Glucose-Capillary: 170 mg/dL — ABNORMAL HIGH (ref 70–99)
Glucose-Capillary: 174 mg/dL — ABNORMAL HIGH (ref 70–99)

## 2020-03-13 ENCOUNTER — Other Ambulatory Visit: Payer: Self-pay

## 2020-03-13 ENCOUNTER — Encounter (HOSPITAL_BASED_OUTPATIENT_CLINIC_OR_DEPARTMENT_OTHER): Payer: PPO | Admitting: Internal Medicine

## 2020-03-13 DIAGNOSIS — E11621 Type 2 diabetes mellitus with foot ulcer: Secondary | ICD-10-CM | POA: Diagnosis not present

## 2020-03-13 DIAGNOSIS — L97524 Non-pressure chronic ulcer of other part of left foot with necrosis of bone: Secondary | ICD-10-CM | POA: Diagnosis not present

## 2020-03-13 LAB — GLUCOSE, CAPILLARY
Glucose-Capillary: 154 mg/dL — ABNORMAL HIGH (ref 70–99)
Glucose-Capillary: 144 mg/dL — ABNORMAL HIGH (ref 70–99)

## 2020-03-13 NOTE — Progress Notes (Addendum)
Martin, [name] ([id number]) Visit Report for 03/13/2020 HBO Details Patient Name: Date of Service: Martin [name], Martin [name] 03/13/2020 8:00 A M Medical Record Number: [id number] Patient Account Number: [id number] Date of Birth/Sex: Treating RN: Jan 12, 1946 (74 y.o. [name] Primary Care Decorey Wahlert: [name] Other Clinician: [name] Referring Javaria Knapke: Treating Imunique Samad/Extender: [name] in Treatment: 11 HBO Treatment Course Details Treatment Course Number: 1 Ordering Meckenzie Balsley: Worthy Keeler T Treatments Ordered: otal 40 HBO Treatment Start Date: 02/22/2020 HBO Indication: Diabetic Ulcer(s) of the Lower Extremity HBO Treatment Details Treatment Number: 14 Patient Type: Outpatient Chamber Type: Monoplace Chamber Serial #: [id number] Treatment Protocol: 2.0 ATA with 90 minutes oxygen, and no air breaks Treatment Details Compression Rate Down: 2.0 psi / minute De-Compression Rate Up: 2.0 psi / minute Air breaks and breathing Decompress Decompress Compress Tx Pressure Begins Reached periods Begins Ends (leave unused spaces blank) Chamber Pressure (ATA 1 2 ------2 1 ) Clock Time (24 hr) 08:19 08:27 - - - - - - 09:57 10:05 Treatment Length: 106 (minutes) Treatment Segments: 4 Vital Signs Capillary Blood Glucose Reference Range: 80 - 120 mg / dl HBO Diabetic Blood Glucose Intervention Range: <131 mg/dl or >249 mg/dl Time Vitals Blood Respiratory Capillary Blood Glucose Pulse Action Type: Pulse: Temperature: Taken: Pressure: Rate: Glucose (mg/dl): Meter #: Oximetry (%) Taken: Pre 08:05 151/74 79 16 97.8 154 Post 10:07 152/80 67 14 98.1 144 Treatment Response Treatment Toleration: Well Treatment Completion Status: Treatment Completed without Adverse Event Additional Procedure Documentation Tissue Sevierity: Necrosis of bone Teriyah Purington Notes No concerns with treatment given Physician HBO Attestation: I certify that I  supervised this HBO treatment in accordance with Medicare guidelines. A trained emergency response team is readily available per Yes hospital policies and procedures. Continue HBOT as ordered. Yes Electronic Signature(s) Signed: 03/13/2020 5:39:18 PM By: Linton Ham MD Previous Signature: 03/13/2020 4:00:55 PM Version By: [name] EMT/HBOT/SD Previous Signature: 03/13/2020 4:00:55 PM Version By: [name] EMT/HBOT/SD Entered By: Linton Ham on 03/13/2020 17:35:54 -------------------------------------------------------------------------------- HBO Safety Checklist Details Patient Name: Date of Service: Martin [name]. 03/13/2020 8:00 A M Medical Record Number: [id number] Patient Account Number: [id number] Date of Birth/Sex: Treating RN: 01-10-1946 (73 y.o. [name] Primary Care Kinzlee Selvy: [name] Other Clinician: [name] Referring Marcial Pless: Treating Ellie Spickler/Extender: [name] in Treatment: 11 HBO Safety Checklist Items Safety Checklist Consent Form Signed Patient voided / foley secured and emptied When did you last eato 0700 - banana Last dose of injectable or oral agent insulin Ostomy pouch emptied and vented if applicable NA All implantable devices assessed, documented and approved NA Intravenous access site secured and place NA Valuables secured Linens and cotton and cotton/polyester blend (less than 51% polyester) Personal oil-based products / skin lotions / body lotions removed Wigs or hairpieces removed NA Smoking or tobacco materials removed NA Books / newspapers / magazines / loose paper removed Cologne, aftershave, perfume and deodorant removed Jewelry removed (may wrap wedding band) Make-up removed NA Hair care products removed Battery operated devices (external) removed NA Heating patches and chemical warmers removed NA Titanium eyewear removed NA Nail polish cured greater than 10  hours NA Casting material cured greater than 10 hours Hearing aids removed NA Loose dentures or partials removed NA Prosthetics have been removed NA Patient demonstrates correct use of air break device (if applicable) Patient concerns have been addressed Patient grounding bracelet on and cord attached to chamber Specifics for Inpatients (complete in addition to above) Medication sheet  sent with patient Intravenous medications needed or due during therapy sent with patient Drainage tubes (e.g. nasogastric tube or chest tube secured and vented) Endotracheal or Tracheotomy tube secured Cuff deflated of air and inflated with saline Airway suctioned Electronic Signature(s) Signed: 03/13/2020 8:21:21 AM By: [name] EMT/HBOT/SD Entered By: [name] on 03/13/2020 08:21:20

## 2020-03-13 NOTE — Progress Notes (Signed)
[name], [name] ([id number]) Visit Report for 03/13/2020 Arrival Information Details Patient Name: Date of Service: Martin [name] 03/13/2020 8:00 A M Medical Record Number: [id number] Patient Account Number: [id number] Date of Birth/Sex: Treating RN: [birth year] (74 y.o. Martin [name], Martin [name] Primary Care Joscelyn Hardrick: [name] Other Clinician: [name] Referring Rashika Bettes: Treating Charm Stenner/Extender: [name] in Treatment: 11 Visit Information History Since Last Visit Added or deleted any medications: No Patient Arrived: Martin [name] Any new allergies or adverse reactions: No Arrival Time: 08:00 Had a fall or experienced change in No Accompanied By: self activities of daily living that may affect Transfer Assistance: None risk of falls: Patient Identification Verified: Yes Signs or symptoms of abuse/neglect since last visito No Secondary Verification Process Completed: Yes Hospitalized since last visit: No Patient Requires Transmission-Based Precautions: No Implantable device outside of the clinic excluding No Patient Has Alerts: No cellular tissue based products placed in the center since last visit: Pain Present Now: No Electronic Signature(s) Signed: 03/13/2020 4:00:55 PM By: [name] EMT/HBOT/SD Entered By: [name] on 03/13/2020 10:22:50 -------------------------------------------------------------------------------- Encounter Discharge Information Details Patient Name: Date of Service: Martin [name]. 03/13/2020 8:00 A M Medical Record Number: [id number] Patient Account Number: [id number] Date of Birth/Sex: Treating RN: Dec 03, 1945 (74 y.o. Martin [name] Primary Care Zannie Locastro: [name] Other Clinician: [name] Referring Osualdo Hansell: Treating Mylene Bow/Extender: [name] in Treatment: 11 Encounter Discharge Information Items Discharge Condition: Stable Ambulatory Status:  Cane Discharge Destination: Home Transportation: Private Auto Accompanied By: self Schedule Follow-up Appointment: Yes Clinical Summary of Care: Patient Declined Electronic Signature(s) Signed: 03/13/2020 4:00:55 PM By: [name] EMT/HBOT/SD Entered By: [name] on 03/13/2020 10:22:40 -------------------------------------------------------------------------------- Patient/Caregiver Education Details Patient Name: Date of Service: Martin [name] 11/15/2021andnbsp8:00 [place] Record Number: [id number] Patient Account Number: [id number] Date of Birth/Gender: Treating RN: [birth year] (74 y.o. Martin [name] Primary Care Physician: [name] Other Clinician: [name] Referring Physician: Treating Physician/Extender: [name] in Treatment: 11 Education Assessment Education Provided To: Patient Education Topics Provided Hyperbaric Oxygenation: Methods: Explain/Verbal Responses: State content correctly Electronic Signature(s) Signed: 03/13/2020 4:00:55 PM By: [name] EMT/HBOT/SD Entered By: [name] on 03/13/2020 10:23:00 -------------------------------------------------------------------------------- Vitals Details Patient Name: Date of Service: Martin [name]. 03/13/2020 8:00 A M Medical Record Number: [id number] Patient Account Number: [id number] Date of Birth/Sex: Treating RN: 1945-09-15 (74 y.o. Martin [name] Primary Care Ky Rumple: [name] Other Clinician: [name] Referring Catheryne Deford: Treating Abbygail Willhoite/Extender: [name] in Treatment: 11 Vital Signs Time Taken: 08:05 Temperature (F): 97.8 Height (in): 72 Pulse (bpm): 79 Weight (lbs): 270 Respiratory Rate (breaths/min): 16 Body Mass Index (BMI): 36.6 Blood Pressure (mmHg): 151/74 Capillary Blood Glucose (mg/dl): 154 Reference Range: 80 - 120 mg / dl Electronic Signature(s) Signed: 03/13/2020  4:00:55 PM By: [name] EMT/HBOT/SD Entered By: [name] on 03/13/2020 08:20:39

## 2020-03-14 ENCOUNTER — Encounter (HOSPITAL_BASED_OUTPATIENT_CLINIC_OR_DEPARTMENT_OTHER): Payer: PPO | Admitting: Internal Medicine

## 2020-03-14 DIAGNOSIS — H40023 Open angle with borderline findings, high risk, bilateral: Secondary | ICD-10-CM | POA: Diagnosis not present

## 2020-03-14 DIAGNOSIS — E113213 Type 2 diabetes mellitus with mild nonproliferative diabetic retinopathy with macular edema, bilateral: Secondary | ICD-10-CM | POA: Diagnosis not present

## 2020-03-14 DIAGNOSIS — H43813 Vitreous degeneration, bilateral: Secondary | ICD-10-CM | POA: Diagnosis not present

## 2020-03-14 DIAGNOSIS — H35013 Changes in retinal vascular appearance, bilateral: Secondary | ICD-10-CM | POA: Diagnosis not present

## 2020-03-14 NOTE — Progress Notes (Signed)
ZAID, TOMES ([id number]) Visit Report for 03/10/2020 Arrival Information Details Patient Name: Date of Service: GEOFFREY, HYNES 03/10/2020 8:00 A M Medical Record Number: [id number] Patient Account Number: [id number] Date of Birth/Sex: Treating RN: Mar 26, 1946 (74 y.o. [name] Primary Care Mak Bonny: [name] Other Clinician: Referring Ambert Virrueta: Treating Fannye Myer/Extender: [name] in Treatment: 11 Visit Information History Since Last Visit Added or deleted any medications: No Patient Arrived: [name] Any new allergies or adverse reactions: No Arrival Time: 07:50 Had a fall or experienced change in No Accompanied By: alone activities of daily living that may affect Transfer Assistance: None risk of falls: Patient Identification Verified: Yes Signs or symptoms of abuse/neglect since last visito No Secondary Verification Process Completed: Yes Hospitalized since last visit: No Patient Requires Transmission-Based Precautions: No Implantable device outside of the clinic excluding No Patient Has Alerts: No cellular tissue based products placed in the center since last visit: Has Dressing in Place as Prescribed: Yes Has Footwear/Offloading in Place as Prescribed: Yes Left: T Contact Cast otal Pain Present Now: No Electronic Signature(s) Signed: 03/13/2020 5:51:53 PM By: [name] RN, BSN Entered By: [name] on 03/10/2020 08:10:57 -------------------------------------------------------------------------------- Encounter Discharge Information Details Patient Name: Date of Service: [name] 03/10/2020 8:00 A M Medical Record Number: [id number] Patient Account Number: [id number] Date of Birth/Sex: Treating RN: [birth year] (74 y.o. [name] Primary Care Eutimio Gharibian: [name] Other Clinician: Referring Hester Forget: Treating Elai Vanwyk/Extender: [name] in Treatment: 11 Encounter  Discharge Information Items Discharge Condition: Stable Ambulatory Status: Cane Discharge Destination: Home Transportation: Private Auto Accompanied By: alone Schedule Follow-up Appointment: Yes Clinical Summary of Care: Patient Declined Electronic Signature(s) Signed: 03/13/2020 5:51:53 PM By: [name] RN, BSN Entered By: [name] on 03/10/2020 10:22:48 -------------------------------------------------------------------------------- Vitals Details Patient Name: Date of Service: [name] 03/10/2020 8:00 A M Medical Record Number: [id number] Patient Account Number: [id number] Date of Birth/Sex: Treating RN: 1945-10-25 (74 y.o. [name] Primary Care Goldia Ligman: [name] Other Clinician: Referring Toniesha Zellner: Treating Keyonta Madrid/Extender: [name] in Treatment: 11 Vital Signs Time Taken: 07:51 Temperature (F): 97.9 Height (in): 72 Pulse (bpm): 75 Weight (lbs): 270 Respiratory Rate (breaths/min): 18 Body Mass Index (BMI): 36.6 Blood Pressure (mmHg): 166/67 Capillary Blood Glucose (mg/dl): 170 Reference Range: 80 - 120 mg / dl Electronic Signature(s) Signed: 03/13/2020 5:51:53 PM By: [name] RN, BSN Entered By: [name] on 03/10/2020 08:11:19

## 2020-03-14 NOTE — Progress Notes (Signed)
CHANNIN, AGUSTIN ([id number]) Visit Report for 03/10/2020 HBO Details Patient Name: Date of Service: Martin [name], Martin [name] 03/10/2020 8:00 A M Medical Record Number: [id number] Patient Account Number: [id number] Date of Birth/Sex: Treating RN: 09/04/1945 (74 y.o. [name] Primary Care Ambrose Wile: [name] Other Clinician: Referring Arlana Canizales: Treating Demetri Kerman/Extender: [name] in Treatment: 11 HBO Treatment Course Details Treatment Course Number: 1 Ordering Alzada Brazee: Worthy Keeler T Treatments Ordered: otal 40 HBO Treatment Start Date: 02/22/2020 HBO Indication: Diabetic Ulcer(s) of the Lower Extremity HBO Treatment Details Treatment Number: 13 Patient Type: Outpatient Chamber Type: Monoplace Chamber Serial #: [id number] Treatment Protocol: 2.0 ATA with 90 minutes oxygen, and no air breaks Treatment Details Compression Rate Down: 2.0 psi / minute De-Compression Rate Up: 2.0 psi / minute Air breaks and breathing Decompress Decompress Compress Tx Pressure Begins Reached periods Begins Ends (leave unused spaces blank) Chamber Pressure (ATA 1 2 ------2 1 ) Clock Time (24 hr) 08:07 08:15 - - - - - - 09:45 09:53 Treatment Length: 106 (minutes) Treatment Segments: 4 Vital Signs Capillary Blood Glucose Reference Range: 80 - 120 mg / dl HBO Diabetic Blood Glucose Intervention Range: <131 mg/dl or >249 mg/dl Time Vitals Blood Respiratory Capillary Blood Glucose Pulse Action Type: Pulse: Temperature: Taken: Pressure: Rate: Glucose (mg/dl): Meter #: Oximetry (%) Taken: Pre 07:51 166/67 75 18 97.9 170 Post 09:55 140/68 68 18 98.2 174 Treatment Response Treatment Completion Status: Treatment Completed without Adverse Event Additional Procedure Documentation Tissue Sevierity: Necrosis of bone Coretha Creswell Notes No concerns with treatment given Physician HBO Attestation: I certify that I supervised this HBO treatment in accordance with  Medicare guidelines. A trained emergency response team is readily available per Yes hospital policies and procedures. Continue HBOT as ordered. Yes Electronic Signature(s) Signed: 03/13/2020 9:07:52 AM By: Linton Ham MD Entered By: Linton Ham on 03/10/2020 14:04:48 -------------------------------------------------------------------------------- HBO Safety Checklist Details Patient Name: Date of Service: [name] 03/10/2020 8:00 A M Medical Record Number: [id number] Patient Account Number: [id number] Date of Birth/Sex: Treating RN: [birth year] (74 y.o. [name] Primary Care Charlene Detter: [name] Other Clinician: Referring Anaiza Behrens: Treating Shaunice Levitan/Extender: [name] in Treatment: 11 HBO Safety Checklist Items Safety Checklist Consent Form Signed Patient voided / foley secured and emptied When did you last eato 0740 - 8 oz Glucerna Last dose of injectable or oral agent 03/09/20 Ostomy pouch emptied and vented if applicable NA All implantable devices assessed, documented and approved NA Intravenous access site secured and place NA Valuables secured Linens and cotton and cotton/polyester blend (less than 51% polyester) Personal oil-based products / skin lotions / body lotions removed Wigs or hairpieces removed Smoking or tobacco materials removed Books / newspapers / magazines / loose paper removed Cologne, aftershave, perfume and deodorant removed Jewelry removed (may wrap wedding band) Make-up removed NA Hair care products removed Battery operated devices (external) removed Heating patches and chemical warmers removed Titanium eyewear removed NA Nail polish cured greater than 10 hours NA Casting material cured greater than 10 hours Hearing aids removed NA Loose dentures or partials removed NA Prosthetics have been removed NA Patient demonstrates correct use of air break device (if applicable) Patient  concerns have been addressed Patient grounding bracelet on and cord attached to chamber Specifics for Inpatients (complete in addition to above) Medication sheet sent with patient NA Intravenous medications needed or due during therapy sent with patient NA Drainage tubes (e.g. nasogastric tube or chest tube secured and vented) NA Endotracheal or  Tracheotomy tube secured NA Cuff deflated of air and inflated with saline NA Airway suctioned NA Electronic Signature(s) Signed: 03/13/2020 5:51:53 PM By: [name] RN, BSN Entered By: [name] on 03/10/2020 08:13:13

## 2020-03-14 NOTE — Progress Notes (Signed)
[name], [name] ([id number]) Visit Report for 03/13/2020 SuperBill Details Patient Name: Date of Service: [name], [name] 03/13/2020 Medical Record Number: [id number] Patient Account Number: [id number] Date of Birth/Sex: Treating RN: July 12, 1945 (74 y.o. 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

## 2020-03-14 NOTE — Progress Notes (Signed)
MAXIMO, SPRATLING ([id number]) Visit Report for 03/10/2020 SuperBill Details Patient Name: Date of Service: Martin [name], Martin [name] 03/10/2020 Medical Record Number: [id number] Patient Account Number: [id number] Date of Birth/Sex: Treating RN: [birth year] (74 y.o. 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

## 2020-03-15 ENCOUNTER — Encounter (HOSPITAL_BASED_OUTPATIENT_CLINIC_OR_DEPARTMENT_OTHER): Payer: PPO | Admitting: Physician Assistant

## 2020-03-15 ENCOUNTER — Other Ambulatory Visit: Payer: Self-pay

## 2020-03-15 DIAGNOSIS — E11621 Type 2 diabetes mellitus with foot ulcer: Secondary | ICD-10-CM | POA: Diagnosis not present

## 2020-03-15 DIAGNOSIS — L97522 Non-pressure chronic ulcer of other part of left foot with fat layer exposed: Secondary | ICD-10-CM | POA: Diagnosis not present

## 2020-03-15 DIAGNOSIS — L97514 Non-pressure chronic ulcer of other part of right foot with necrosis of bone: Secondary | ICD-10-CM | POA: Diagnosis not present

## 2020-03-15 LAB — GLUCOSE, CAPILLARY
Glucose-Capillary: 185 mg/dL — ABNORMAL HIGH (ref 70–99)
Glucose-Capillary: 200 mg/dL — ABNORMAL HIGH (ref 70–99)

## 2020-03-15 NOTE — Progress Notes (Addendum)
AYCE, PIETRZYK ([id number]) Visit Report for 03/15/2020 HBO Details Patient Name: Date of Service: Martin [name], Martin [name] 03/15/2020 10:30 A M Medical Record Number: [id number] Patient Account Number: [id number] Date of Birth/Sex: Treating RN: 1946/02/15 (74 y.o. Martin [name] Primary Care Chandell Attridge: [name] Other Clinician: [name] Referring Emrey Thornley: Treating Katrece Roediger/Extender: Sandria Bales in Treatment: 12 HBO Treatment Course Details Treatment Course Number: 1 Ordering Vedanth Sirico: Worthy Keeler T Treatments Ordered: otal 40 HBO Treatment Start Date: 02/22/2020 HBO Indication: Diabetic Ulcer(s) of the Lower Extremity HBO Treatment Details Treatment Number: 15 Patient Type: Outpatient Chamber Type: Monoplace Chamber Serial #: [id number] Treatment Protocol: 2.0 ATA with 90 minutes oxygen, and no air breaks Treatment Details Compression Rate Down: 2.0 psi / minute De-Compression Rate Up: 2.0 psi / minute Air breaks and breathing Decompress Decompress Compress Tx Pressure Begins Reached periods Begins Ends (leave unused spaces blank) Chamber Pressure (ATA 1 2 ------2 1 ) Clock Time (24 hr) 10:06 10:14 - - - - - - 11:44 11:52 Treatment Length: 106 (minutes) Treatment Segments: 4 Vital Signs Capillary Blood Glucose Reference Range: 80 - 120 mg / dl HBO Diabetic Blood Glucose Intervention Range: <131 mg/dl or >249 mg/dl Time Vitals Blood Respiratory Capillary Blood Glucose Pulse Action Type: Pulse: Temperature: Taken: Pressure: Rate: Glucose (mg/dl): Meter #: Oximetry (%) Taken: Pre 09:50 156/89 86 18 98.2 185 Post 11:54 156/71 75 15 98.2 200 Treatment Response Treatment Toleration: Well Treatment Completion Status: Treatment Completed without Adverse Event Additional Procedure Documentation Tissue Sevierity: Necrosis of bone Electronic Signature(s) Signed: 03/15/2020 3:40:21 PM By: [name] EMT/HBOT/SD Signed:  03/15/2020 5:04:51 PM By: Worthy Keeler PA-C Entered By: [name] on 03/15/2020 12:02:15 -------------------------------------------------------------------------------- HBO Safety Checklist Details Patient Name: Date of Service: Martin [name]. 03/15/2020 10:30 A M Medical Record Number: [id number] Patient Account Number: [id number] Date of Birth/Sex: Treating RN: 07/10/1945 (74 y.o. Martin [name] Primary Care Cece Milhouse: [name] Other Clinician: [name] Referring Raynah Gomes: Treating Jalyric Kaestner/Extender: Sandria Bales in Treatment: 12 HBO Safety Checklist Items Safety Checklist Consent Form Signed Patient voided / foley secured and emptied When did you last eato 0900 - bacon egg biscuit Last dose of injectable or oral agent insulin Ostomy pouch emptied and vented if applicable NA All implantable devices assessed, documented and approved NA Intravenous access site secured and place NA Valuables secured Linens and cotton and cotton/polyester blend (less than 51% polyester) Personal oil-based products / skin lotions / body lotions removed Wigs or hairpieces removed NA Smoking or tobacco materials removed NA Books / newspapers / magazines / loose paper removed Cologne, aftershave, perfume and deodorant removed Jewelry removed (may wrap wedding band) Make-up removed NA Hair care products removed Battery operated devices (external) removed NA Heating patches and chemical warmers removed NA Titanium eyewear removed NA Nail polish cured greater than 10 hours NA Casting material cured greater than 10 hours Hearing aids removed NA Loose dentures or partials removed NA Prosthetics have been removed NA Patient demonstrates correct use of air break device (if applicable) Patient concerns have been addressed Patient grounding bracelet on and cord attached to chamber Specifics for Inpatients (complete in addition to  above) Medication sheet sent with patient Intravenous medications needed or due during therapy sent with patient Drainage tubes (e.g. nasogastric tube or chest tube secured and vented) Endotracheal or Tracheotomy tube secured Cuff deflated of air and inflated with saline Airway suctioned Electronic Signature(s) Signed: 03/15/2020 10:14:03 AM By: [name] EMT/HBOT/SD  Entered ByMikeal Hawthorne on 03/15/2020 10:14:02

## 2020-03-15 NOTE — Progress Notes (Addendum)
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 Open. Original cause of wound was Gradually Appeared. The wound is located on the Left Metatarsal head fifth. The wound measures 0.5cm length x 0.4cm  width x 0.2cm depth; 0.157cm^2 area and 0.031cm^3 volume. There is Fat Layer (Subcutaneous Tissue) exposed. There is no tunneling noted, however, there is undermining starting at 12:00 and ending at 12:00 with a maximum distance of 0.5cm. There is a small amount of serous drainage noted. The wound margin is epibole. There is large (67-100%) pink, pale granulation within the wound bed. There is no necrotic tissue within the wound bed. 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 There was a Excisional Skin/Subcutaneous Tissue Debridement with a total area of 0.48 sq cm performed by Worthy Keeler, PA. With the following instrument(s): Curette to remove Viable and Non-Viable tissue/material. Material removed includes Callus, Subcutaneous Tissue, and Skin: Epidermis after achieving pain control using Lidocaine 5% topical ointment. No specimens were taken. A time out was conducted at 13:18, prior to the start of the procedure. A Minimum amount of bleeding was controlled with Pressure. The procedure was tolerated well with a pain level of 0 throughout and a pain level of 0 following the procedure. Post Debridement Measurements: 0.8cm length x 0.6cm width x 0.2cm depth; 0.075cm^3 volume. Character of Wound/Ulcer Post Debridement is improved. Severity of Tissue Post Debridement is: Fat layer exposed. Post procedure Diagnosis Wound #5: Same as Pre-Procedure Pre-procedure diagnosis of Wound #5 is a Diabetic Wound/Ulcer of the Lower Extremity  located on the Left Metatarsal head fifth . There was a T Contact otal Cast Procedure by Worthy Keeler, PA. Post procedure Diagnosis Wound #5: Same as Pre-Procedure Plan  Follow-up Appointments: Return Appointment in 1 week. - Wednesday with Margarita Grizzle Dressing Change Frequency: Wound #5 Left Metatarsal head fifth: Do not change entire dressing for one week. Skin Barriers/Peri-Wound Care: Antifungal powder - nystatin powder to groins 2 times per day as needed for rash Wound Cleansing: Wound #5 Left Metatarsal head fifth: May shower with protection. Primary Wound Dressing: Wound #5 Left Metatarsal head fifth: Silver Collagen Secondary Dressing: Wound #5 Left Metatarsal head fifth: Foam - donut Dry Gauze Drawtex - cut to fit inside wound edges behind collagen Off-Loading: T Contact Cast to Left Lower Extremity otal Additional Orders / Instructions: Follow Nutritious Diet Other: - take one probiotic capsule daily Hyperbaric Oxygen Therapy: Evaluate for HBO Therapy Indication: - wagner grade 3 diabetic foot ulcer left 5th met head If appropriate for treatment, begin HBOT per protocol: 2.0 ATA for 90 Minutes without Air Breaks T Number of Treatments: - 40 otal One treatments per day (delivered Monday through Friday unless otherwise specified in Special Instructions below): Finger stick Blood Glucose Pre- and Post- HBOT Treatment. Follow Hyperbaric Oxygen Glycemia Protocol Afrin (Oxymetazoline HCL) 0.05% nasal spray - 1 spray in both nostrils daily as needed prior to HBO treatment for difficulty clearing ears 1. Would recommend at this point that we actually go ahead and continue with the current wound care measures were using collagen for the wound itself and then subsequently we are utilizing a total contact cast for appropriate offloading. He is doing well in both regards. 2. I am also can recommend the patient continue with hyperbaric oxygen therapy as it seems to be doing an  excellent job for him. 3. I am also can recommend that the patient continue to monitor for any signs of worsening infection obviously if he has any increased pain, drainage, or fever, chills, nausea, vomiting, or diarrhea he should let me know. We will see patient back for reevaluation in 1 week here in the clinic. If anything worsens or changes patient will contact our office for additional recommendations. Electronic Signature(s) Signed: 03/15/2020 2:10:21 PM By: Worthy Keeler PA-C Entered By: Worthy Keeler on 03/15/2020 14:10:21 -------------------------------------------------------------------------------- Total Contact Cast Details Patient Name: Date of Service: Martin [name], Martin [name] 03/15/2020 12:45 PM Medical Record Number: [id number] Patient Account Number: [id number] Date of Birth/Sex: Treating RN: 01/20/1946 (74 y.o. [name] Primary Care Provider: [name] Other Clinician: Referring Provider: Treating Provider/Extender: Sandria Bales in Treatment: 12 T Contact Cast Applied for Wound Assessment: otal Wound #5 Left Metatarsal head fifth Performed By: Physician Worthy Keeler, PA Post Procedure Diagnosis Same as Pre-procedure Electronic Signature(s) Signed: 03/15/2020 5:04:51 PM By: Worthy Keeler PA-C Signed: 03/16/2020 2:58:18 PM By: Baruch Gouty RN, BSN Entered By: Baruch Gouty on 03/15/2020 13:24:37 -------------------------------------------------------------------------------- SuperBill Details Patient Name: Date of Service: Martin Agee. 03/15/2020 Medical Record Number: [id number] Patient Account Number: [id number] Date of Birth/Sex: Treating RN: 1945-08-02 (74 y.o. [name] Primary Care Provider: [name] Other Clinician: Referring Provider: Treating Provider/Extender: Sandria Bales in Treatment: 12 Diagnosis Coding ICD-10 Codes Code Description [phone] Type 2  diabetes mellitus with foot ulcer L97.522 Non-pressure chronic ulcer of other part of left foot with fat layer exposed I10 Essential (primary) hypertension I25.10 Atherosclerotic heart disease of native coronary artery without angina pectoris M86.272 Subacute osteomyelitis, left ankle and foot Facility Procedures CPT4 Code: 75916384 1 Description: 6659 - DEB SUBQ TISSUE 20 SQ CM/< ICD-10 Diagnosis Description L97.522 Non-pressure chronic ulcer of other part  of left foot with fat layer exposed Modifier: Quantity: 1 Physician Procedures : CPT4 Code Description Modifier [id number] 80044 - WC PHYS SUBQ TISS 20 SQ CM ICD-10 Diagnosis Description L97.522 Non-pressure chronic ulcer of other part of left foot with fat layer exposed Quantity: 1 Electronic Signature(s) Signed: 03/15/2020 2:10:46 PM By: Worthy Keeler PA-C Entered By: Worthy Keeler on 03/15/2020 14:10:45

## 2020-03-15 NOTE — Progress Notes (Signed)
CORBITT, CLOKE ([id number]) Visit Report for 03/15/2020 SuperBill Details Patient Name: Date of Service: Martin [name], Martin [name] 03/15/2020 Medical Record Number: [id number] Patient Account Number: [id number] Date of Birth/Sex: Treating RN: Sep 13, 1945 (74 y.o. [name], [name] Primary Care Provider: [name] Other Clinician: [name] Referring Provider: Treating Provider/Extender: Sandria Bales in Treatment: 12 Diagnosis Coding ICD-10 Codes Code Description [phone] Type 2 diabetes mellitus with foot ulcer L97.522 Non-pressure chronic ulcer of other part of left foot with fat layer exposed I10 Essential (primary) hypertension I25.10 Atherosclerotic heart disease of native coronary artery without angina pectoris M86.272 Subacute osteomyelitis, left ankle and foot Facility Procedures CPT4 Code Description Modifier Quantity [id number] G0277-(Facility Use Only) HBOT full body chamber, 46min , 4 Physician Procedures Quantity CPT4 Code Description Modifier [id number] 85027 - WC PHYS HYPERBARIC OXYGEN THERAPY 1 ICD-10 Diagnosis Description E11.621 Type 2 diabetes mellitus with foot ulcer Electronic Signature(s) Signed: 03/15/2020 3:40:21 PM By: [name] EMT/HBOT/SD Signed: 03/15/2020 5:04:51 PM By: Worthy Keeler PA-C Entered By: [name] on 03/15/2020 12:02:22

## 2020-03-15 NOTE — Progress Notes (Signed)
FERDINAND, REVOIR ([id number]) Visit Report for 03/15/2020 Arrival Information Details Patient Name: Date of Service: VICTORY, STROLLO 03/15/2020 10:30 A M Medical Record Number: [id number] Patient Account Number: [id number] Date of Birth/Sex: Treating RN: 09-04-1945 (74 y.o. [name], [name] Primary Care Dannon Perlow: [name] Other Clinician: [name] Referring Edita Weyenberg: Treating Ezekiah Massie/Extender: Sandria Bales in Treatment: 12 Visit Information History Since Last Visit Added or deleted any medications: No Patient Arrived: [name] Any new allergies or adverse reactions: No Arrival Time: 09:45 Had a fall or experienced change in No Accompanied By: self activities of daily living that may affect Transfer Assistance: None risk of falls: Patient Identification Verified: Yes Signs or symptoms of abuse/neglect since last visito No Secondary Verification Process Completed: Yes Hospitalized since last visit: No Patient Requires Transmission-Based Precautions: No Implantable device outside of the clinic excluding No Patient Has Alerts: No cellular tissue based products placed in the center since last visit: Pain Present Now: No Electronic Signature(s) Signed: 03/15/2020 3:40:21 PM By: [name] EMT/HBOT/SD Entered By: [name] on 03/15/2020 10:13:01 -------------------------------------------------------------------------------- Encounter Discharge Information Details Patient Name: Date of Service: [name] 03/15/2020 10:30 A M Medical Record Number: [id number] Patient Account Number: [id number] Date of Birth/Sex: Treating RN: 1945-09-24 (74 y.o. [name] Primary Care Shaddai Shapley: [name] Other Clinician: [name] Referring Brekyn Huntoon: Treating Abrielle Finck/Extender: Sandria Bales in Treatment: 12 Encounter Discharge Information Items Discharge Condition: Stable Ambulatory Status:  Cane Discharge Destination: Home Transportation: Private Auto Accompanied By: self Schedule Follow-up Appointment: Yes Clinical Summary of Care: Patient Declined Electronic Signature(s) Signed: 03/15/2020 3:40:21 PM By: [name] EMT/HBOT/SD Entered By: [name] on 03/15/2020 12:02:51 -------------------------------------------------------------------------------- Patient/Caregiver Education Details Patient Name: Date of Service: [name] 11/17/2021andnbsp10:30 A M Medical Record Number: [id number] Patient Account Number: [id number] Date of Birth/Gender: Treating RN: 1945/05/09 (74 y.o. [name] Primary Care Physician: [name] Other Clinician: [name] Referring Physician: Treating Physician/Extender: Sandria Bales in Treatment: 12 Education Assessment Education Provided To: Patient Education Topics Provided Hyperbaric Oxygenation: Methods: Explain/Verbal Responses: State content correctly Electronic Signature(s) Signed: 03/15/2020 3:40:21 PM By: [name] EMT/HBOT/SD Entered By: [name] on 03/15/2020 12:02:37 -------------------------------------------------------------------------------- Vitals Details Patient Name: Date of Service: [name] 03/15/2020 10:30 A M Medical Record Number: [id number] Patient Account Number: [id number] Date of Birth/Sex: Treating RN: 1945/11/18 (74 y.o. [name] Primary Care Jourdan Maldonado: [name] Other Clinician: [name] Referring Ralyn Stlaurent: Treating Larraine Argo/Extender: Sandria Bales in Treatment: 12 Vital Signs Time Taken: 09:50 Temperature (F): 98.2 Height (in): 72 Pulse (bpm): 86 Weight (lbs): 270 Respiratory Rate (breaths/min): 18 Body Mass Index (BMI): 36.6 Blood Pressure (mmHg): 156/89 Capillary Blood Glucose (mg/dl): 185 Reference Range: 80 - 120 mg / dl Electronic Signature(s) Signed:  03/15/2020 3:40:21 PM By: [name] EMT/HBOT/SD Entered By: [name] on 03/15/2020 10:13:18

## 2020-03-16 ENCOUNTER — Other Ambulatory Visit: Payer: Self-pay

## 2020-03-16 ENCOUNTER — Encounter (HOSPITAL_BASED_OUTPATIENT_CLINIC_OR_DEPARTMENT_OTHER): Payer: PPO | Admitting: Internal Medicine

## 2020-03-16 DIAGNOSIS — L97514 Non-pressure chronic ulcer of other part of right foot with necrosis of bone: Secondary | ICD-10-CM | POA: Diagnosis not present

## 2020-03-16 DIAGNOSIS — E11621 Type 2 diabetes mellitus with foot ulcer: Secondary | ICD-10-CM | POA: Diagnosis not present

## 2020-03-16 LAB — GLUCOSE, CAPILLARY
Glucose-Capillary: 176 mg/dL — ABNORMAL HIGH (ref 70–99)
Glucose-Capillary: 191 mg/dL — ABNORMAL HIGH (ref 70–99)

## 2020-03-16 NOTE — Progress Notes (Signed)
IGNACIO, LOWDER ([id number]) Visit Report for 03/15/2020 Arrival Information Details Patient Name: Date of Service: Martin [name], Martin [name] 03/15/2020 12:45 PM Medical Record Number: [id number] Patient Account Number: [id number] Date of Birth/Sex: Treating RN: [birth year] (74 y.o. Martin [name] Primary Care Kioni Stahl: [name] Other Clinician: Referring Kenyada Hy: Treating Cornella Emmer/Extender: Sandria Bales in Treatment: 12 Visit Information History Since Last Visit Added or deleted any medications: No Patient Arrived: Ambulatory Any new allergies or adverse reactions: No Arrival Time: 12:56 Had a fall or experienced change in No Accompanied By: alone activities of daily living that may affect Transfer Assistance: None risk of falls: Patient Identification Verified: Yes Signs or symptoms of abuse/neglect since last visito No Secondary Verification Process Completed: Yes Hospitalized since last visit: No Patient Requires Transmission-Based Precautions: No Implantable device outside of the clinic excluding No Patient Has Alerts: No cellular tissue based products placed in the center since last visit: Has Dressing in Place as Prescribed: Yes Has Footwear/Offloading in Place as Prescribed: Yes Left: T Contact Cast otal Pain Present Now: No Electronic Signature(s) Signed: 03/15/2020 5:17:00 PM By: [name] RN, BSN Entered By: [name] on 03/15/2020 12:57:14 -------------------------------------------------------------------------------- Encounter Discharge Information Details Patient Name: Date of Service: Martin Agee. 03/15/2020 12:45 PM Medical Record Number: [id number] Patient Account Number: [id number] Date of Birth/Sex: Treating RN: 1946/03/25 (74 y.o. Martin [name] Primary Care Avya Flavell: [name] Other Clinician: Referring Elira Colasanti: Treating Oktober Glazer/Extender: Sandria Bales in Treatment:  12 Encounter Discharge Information Items Post Procedure Vitals Discharge Condition: Stable Temperature (F): 98.3 Ambulatory Status: Ambulatory Pulse (bpm): 88 Discharge Destination: Home Respiratory Rate (breaths/min): 18 Transportation: Private Auto Blood Pressure (mmHg): 174/81 Accompanied By: self Schedule Follow-up Appointment: Yes Clinical Summary of Care: Patient Declined Electronic Signature(s) Signed: 03/15/2020 5:02:10 PM By: Carlene Coria RN Entered By: Carlene Coria on 03/15/2020 13:43:00 -------------------------------------------------------------------------------- Lower Extremity Assessment Details Patient Name: Date of Service: Martin [name], Martin [name] 03/15/2020 12:45 PM Medical Record Number: [id number] Patient Account Number: [id number] Date of Birth/Sex: Treating RN: [birth year] (74 y.o. Martin [name] Primary Care Reah Justo: [name] Other Clinician: Referring Kayda Allers: Treating Claretta Kendra/Extender: Sandria Bales in Treatment: 12 Edema Assessment Assessed: [name]: No] [name]: No] Edema: [Left: Ye] [Right: s] Calf Left: Right: Point of Measurement: From Medial Instep 40 cm Ankle Left: Right: Point of Measurement: From Medial Instep 27 cm Vascular Assessment Pulses: Dorsalis Pedis Palpable: [Left:Yes] Electronic Signature(s) Signed: 03/15/2020 5:17:00 PM By: [name] RN, BSN Entered By: [name] on 03/15/2020 13:06:36 -------------------------------------------------------------------------------- Multi-Disciplinary Care Plan Details Patient Name: Date of Service: Martin Agee. 03/15/2020 12:45 PM Medical Record Number: [id number] Patient Account Number: [id number] Date of Birth/Sex: Treating RN: 20-Jan-1946 (74 y.o. Martin [name] Primary Care Malyn Aytes: [name] Other Clinician: Referring Lahari Suttles: Treating Jasmin Winberry/Extender: Sandria Bales in Treatment: 12 Active  Inactive Nutrition Nursing Diagnoses: Impaired glucose control: actual or potential Potential for alteratiion in Nutrition/Potential for imbalanced nutrition Goals: Patient/caregiver will maintain therapeutic glucose control Date Initiated: 12/22/2019 Target Resolution Date: 03/22/2020 Goal Status: Active Interventions: Assess HgA1c results as ordered upon admission and as needed Treatment Activities: Patient referred to Primary Care Physician for further nutritional evaluation : 12/22/2019 Notes: Wound/Skin Impairment Nursing Diagnoses: Impaired tissue integrity Knowledge deficit related to ulceration/compromised skin integrity Goals: Patient/caregiver will verbalize understanding of skin care regimen Date Initiated: 12/22/2019 Target Resolution Date: 04/12/2020 Goal Status: Active Ulcer/skin breakdown will have a volume reduction of 30% by week 4 Date Initiated:  12/22/2019 Date Inactivated: 01/19/2020 Target Resolution Date: 01/19/2020 Goal Status: Unmet Unmet Reason: infection Ulcer/skin breakdown will have a volume reduction of 50% by week 8 Date Initiated: 01/19/2020 Date Inactivated: 02/16/2020 Target Resolution Date: 02/16/2020 Goal Status: Unmet Unmet Reason: infection Ulcer/skin breakdown will have a volume reduction of 80% by week 12 Date Initiated: 02/16/2020 Date Inactivated: 03/15/2020 Target Resolution Date: 03/15/2020 Goal Status: Unmet Unmet Reason: osteo, getting HBO Interventions: Assess patient/caregiver ability to obtain necessary supplies Assess patient/caregiver ability to perform ulcer/skin care regimen upon admission and as needed Assess ulceration(s) every visit Provide education on ulcer and skin care Treatment Activities: Skin care regimen initiated : 12/22/2019 Topical wound management initiated : 12/22/2019 Notes: Electronic Signature(s) Signed: 03/16/2020 2:58:18 PM By: Baruch Gouty RN, BSN Entered By: Baruch Gouty on 03/15/2020  13:14:55 -------------------------------------------------------------------------------- Pain Assessment Details Patient Name: Date of Service: Martin Agee. 03/15/2020 12:45 PM Medical Record Number: [id number] Patient Account Number: [id number] Date of Birth/Sex: Treating RN: 04-11-1946 (74 y.o. Martin [name] Primary Care Jennfier Abdulla: [name] Other Clinician: Referring Rage Beever: Treating Teliyah Royal/Extender: Sandria Bales in Treatment: 12 Active Problems Location of Pain Severity and Description of Pain Patient Has Paino No Site Locations Pain Management and Medication Current Pain Management: Electronic Signature(s) Signed: 03/15/2020 5:17:00 PM By: [name] RN, BSN Entered By: [name] on 03/15/2020 12:58:14 -------------------------------------------------------------------------------- Patient/Caregiver Education Details Patient Name: Date of Service: Martin Agee 11/17/2021andnbsp12:45 PM Medical Record Number: [id number] Patient Account Number: [id number] Date of Birth/Gender: Treating RN: 23-Nov-1945 (74 y.o. Martin [name] Primary Care Physician: [name] Other Clinician: Referring Physician: Treating Physician/Extender: Sandria Bales in Treatment: 12 Education Assessment Education Provided To: Patient Education Topics Provided Hyperbaric Oxygenation: Methods: Explain/Verbal Responses: Reinforcements needed, State content correctly Offloading: Methods: Explain/Verbal Responses: Reinforcements needed, State content correctly Wound/Skin Impairment: Methods: Explain/Verbal Responses: Reinforcements needed, State content correctly Electronic Signature(s) Signed: 03/16/2020 2:58:18 PM By: Baruch Gouty RN, BSN Entered By: Baruch Gouty on 03/15/2020 13:15:44 -------------------------------------------------------------------------------- Wound Assessment Details Patient  Name: Date of Service: Martin Agee. 03/15/2020 12:45 PM Medical Record Number: [id number] Patient Account Number: [id number] Date of Birth/Sex: Treating RN: 1945/10/01 (74 y.o. Martin [name] Primary Care Meryl Ponder: [name] Other Clinician: Referring Marilla Boddy: Treating Ezell Melikian/Extender: Sandria Bales in Treatment: 12 Wound Status Wound Number: 5 Primary Diabetic Wound/Ulcer of the Lower Extremity Etiology: Wound Location: Left Metatarsal head fifth Wound Open Wounding Event: Gradually Appeared Status: Date Acquired: 10/31/2019 Comorbid Cataracts, Coronary Artery Disease, Hypertension, Type II Weeks Of Treatment: 12 History: Diabetes, Neuropathy Clustered Wound: No Photos Photo Uploaded By: [name] on 03/16/2020 14:09:38 Wound Measurements Length: (cm) 0.5 Width: (cm) 0.4 Depth: (cm) 0.2 Area: (cm) 0.157 Volume: (cm) 0.031 % Reduction in Area: 87% % Reduction in Volume: 96.3% Epithelialization: Medium (34-66%) Tunneling: No Undermining: Yes Starting Position (o'clock): 12 Ending Position (o'clock): 12 Maximum Distance: (cm) 0.5 Wound Description Classification: Grade 3 Wound Margin: Epibole Exudate Amount: Small Exudate Type: Serous Exudate Color: amber Foul Odor After Cleansing: No Slough/Fibrino Yes Wound Bed Granulation Amount: Large (67-100%) Exposed Structure Granulation Quality: Pink, Pale Fascia Exposed: No Necrotic Amount: None Present (0%) Fat Layer (Subcutaneous Tissue) Exposed: Yes Tendon Exposed: No Muscle Exposed: No Joint Exposed: No Bone Exposed: No Treatment Notes Wound #5 (Left Metatarsal head fifth) 1. Cleanse With Wound Cleanser 3. Primary Dressing Applied Col[employer] 4. Secondary Dressing Dry Gauze Foam Drawtex 7. Footwear/Offloading device applied Removable Cast Walker/Walking Boot T Contact Cast otal Notes  saline to moisten collagen , foam donut. extra padding to left foot. last  layer of TCC applied by PA. Electronic Signature(s) Signed: 03/15/2020 5:17:00 PM By: [name] RN, BSN Entered By: [name] on 03/15/2020 13:08:04 -------------------------------------------------------------------------------- Vitals Details Patient Name: Date of Service: Martin Agee. 03/15/2020 12:45 PM Medical Record Number: [id number] Patient Account Number: [id number] Date of Birth/Sex: Treating RN: 1945-12-30 (74 y.o. Martin [name] Primary Care Kristain Hu: [name] Other Clinician: Referring Purl Claytor: Treating Rekia Kujala/Extender: Sandria Bales in Treatment: 12 Vital Signs Time Taken: 12:57 Temperature (F): 98.3 Height (in): 72 Pulse (bpm): 88 Weight (lbs): 270 Respiratory Rate (breaths/min): 18 Body Mass Index (BMI): 36.6 Blood Pressure (mmHg): 174/81 Capillary Blood Glucose (mg/dl): 185 Reference Range: 80 - 120 mg / dl Electronic Signature(s) Signed: 03/15/2020 5:17:00 PM By: [name] RN, BSN Entered By: [name] on 03/15/2020 12:58:07

## 2020-03-16 NOTE — Progress Notes (Addendum)
BRODAN, GREWELL ([id number]) Visit Report for 03/16/2020 HBO Details Patient Name: Date of Service: BARACK, NICODEMUS 03/16/2020 8:00 A M Medical Record Number: [id number] Patient Account Number: [id number] Date of Birth/Sex: Treating RN: 1945/10/09 (74 y.o. [name], [name] Primary Care Ric Rosenberg: [name] Other Clinician: [name] Referring Kirill Chatterjee: Treating Brier Firebaugh/Extender: [name] in Treatment: 12 HBO Treatment Course Details Treatment Course Number: 1 Ordering [place]aska Hagger: Worthy Keeler T Treatments Ordered: otal 40 HBO Treatment Start Date: 02/22/2020 HBO Indication: Diabetic Ulcer(s) of the Lower Extremity HBO Treatment Details Treatment Number: 16 Patient Type: Outpatient Chamber Type: Monoplace Chamber Serial #: [id number] Treatment Protocol: 2.0 ATA with 90 minutes oxygen, and no air breaks Treatment Details Compression Rate Down: 2.0 psi / minute De-Compression Rate Up: 2.0 psi / minute Air breaks and breathing Decompress Decompress Compress Tx Pressure Begins Reached periods Begins Ends (leave unused spaces blank) Chamber Pressure (ATA 1 2 ------2 1 ) Clock Time (24 hr) 08:12 08:20 - - - - - - 09:50 09:58 Treatment Length: 106 (minutes) Treatment Segments: 4 Vital Signs Capillary Blood Glucose Reference Range: 80 - 120 mg / dl HBO Diabetic Blood Glucose Intervention Range: <131 mg/dl or >249 mg/dl Time Vitals Blood Respiratory Capillary Blood Glucose Pulse Action Type: Pulse: Temperature: Taken: Pressure: Rate: Glucose (mg/dl): Meter #: Oximetry (%) Taken: Pre 07:55 159/78 78 16 98 176 Post 10:00 143/80 70 14 98 191 Treatment Response Treatment Toleration: Well Treatment Completion Status: Treatment Completed without Adverse Event Additional Procedure Documentation Tissue Sevierity: Necrosis of bone Stancil Deisher Notes No concerns with treatment given Physician HBO Attestation: I certify that I supervised  this HBO treatment in accordance with Medicare guidelines. A trained emergency response team is readily available per Yes hospital policies and procedures. Continue HBOT as ordered. Yes Electronic Signature(s) Signed: 03/16/2020 5:14:03 PM By: Linton Ham MD Previous Signature: 03/16/2020 3:37:29 PM Version By: [name] EMT/HBOT/SD Previous Signature: 03/16/2020 3:37:29 PM Version By: [name] EMT/HBOT/SD Entered By: Linton Ham on 03/16/2020 17:10:03 -------------------------------------------------------------------------------- HBO Safety Checklist Details Patient Name: Date of Service: [name] 03/16/2020 8:00 A M Medical Record Number: [id number] Patient Account Number: [id number] Date of Birth/Sex: Treating RN: Jan 23, 1946 (74 y.o. [name], [name] Primary Care Yiannis Tulloch: [name] Other Clinician: [name] Referring Yaiza Palazzola: Treating Falan Hensler/Extender: [name] in Treatment: 12 HBO Safety Checklist Items Safety Checklist Consent Form Signed Patient voided / foley secured and emptied When did you last eato n/a Last dose of injectable or oral agent insulin Ostomy pouch emptied and vented if applicable NA All implantable devices assessed, documented and approved NA Intravenous access site secured and place NA Valuables secured Linens and cotton and cotton/polyester blend (less than 51% polyester) Personal oil-based products / skin lotions / body lotions removed Wigs or hairpieces removed NA Smoking or tobacco materials removed NA Books / newspapers / magazines / loose paper removed Cologne, aftershave, perfume and deodorant removed Jewelry removed (may wrap wedding band) Make-up removed NA Hair care products removed Battery operated devices (external) removed NA Heating patches and chemical warmers removed NA Titanium eyewear removed NA Nail polish cured greater than 10 hours NA Casting  material cured greater than 10 hours Hearing aids removed NA Loose dentures or partials removed NA Prosthetics have been removed NA Patient demonstrates correct use of air break device (if applicable) Patient concerns have been addressed Patient grounding bracelet on and cord attached to chamber Specifics for Inpatients (complete in addition to above) Medication sheet sent with  patient Intravenous medications needed or due during therapy sent with patient Drainage tubes (e.g. nasogastric tube or chest tube secured and vented) Endotracheal or Tracheotomy tube secured Cuff deflated of air and inflated with saline Airway suctioned Electronic Signature(s) Signed: 03/16/2020 8:03:16 AM By: [name] EMT/HBOT/SD Entered By: [name] on 03/16/2020 08:03:15

## 2020-03-16 NOTE — Progress Notes (Signed)
Martin [name], Martin [name] ([id number]) Visit Report for 03/16/2020 SuperBill Details Patient Name: Date of Service: Martin [name], Martin [name] 03/16/2020 Medical Record Number: [id number] Patient Account Number: [id number] Date of Birth/Sex: Treating RN: 1945/05/11 (74 y.o. 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

## 2020-03-16 NOTE — Progress Notes (Signed)
Martin [name], Martin [name] ([id number]) Visit Report for 03/16/2020 Arrival Information Details Patient Name: Date of Service: Martin [name], Martin [name] 03/16/2020 8:00 A M Medical Record Number: [id number] Patient Account Number: [id number] Date of Birth/Sex: Treating RN: [birth year]Martin [name] (74 y.o. Martin [name], Martin [name] Primary Care Donda Friedli: [name] Other Clinician: [name] Referring Crews Mccollam: Treating Elman Dettman/Extender: [name] in Treatment: 12 Visit Information History Since Last Visit Added or deleted any medications: No Patient Arrived: Martin [name] Any new allergies or adverse reactions: No Arrival Time: 07:50 Had a fall or experienced change in No Accompanied By: self activities of daily living that may affect Transfer Assistance: None risk of falls: Patient Identification Verified: Yes Signs or symptoms of abuse/neglect since last visito No Secondary Verification Process Completed: Yes Hospitalized since last visit: No Patient Requires Transmission-Based Precautions: No Implantable device outside of the clinic excluding No Patient Has Alerts: No cellular tissue based products placed in the center since last visit: Pain Present Now: No Electronic Signature(s) Signed: 03/16/2020 3:37:29 PM By: [name] EMT/HBOT/SD Entered By: [name] on 03/16/2020 08:02:05 -------------------------------------------------------------------------------- Encounter Discharge Information Details Patient Name: Date of Service: Martin Agee. 03/16/2020 8:00 A M Medical Record Number: [id number] Patient Account Number: [id number] Date of Birth/Sex: Treating RN: Martin [name] (74 y.o. Martin [name] Primary Care Tnya Ades: [name] Other Clinician: [name] Referring Kailynn Satterly: Treating Emya Picado/Extender: [name] in Treatment: 12 Encounter Discharge Information Items Discharge Condition: Stable Ambulatory Status:  Cane Discharge Destination: Home Transportation: Private Auto Accompanied By: self Schedule Follow-up Appointment: Yes Clinical Summary of Care: Patient Declined Electronic Signature(s) Signed: 03/16/2020 3:37:29 PM By: [name] EMT/HBOT/SD Entered By: [name] on 03/16/2020 10:46:43 -------------------------------------------------------------------------------- Patient/Caregiver Education Details Patient Name: Date of Service: Martin [name], [name] 11/18/2021andnbsp8:00 [place] Record Number: [id number] Patient Account Number: [id number] Date of Birth/Gender: Treating RN: [name]-[birth year]-Martin [name] (74 y.o. Martin [name] Primary Care Physician: [name] Other Clinician: [name] Referring Physician: Treating Physician/Extender: [name] in Treatment: 12 Education Assessment Education Provided To: Patient Education Topics Provided Hyperbaric Oxygenation: Methods: Explain/Verbal Responses: State content correctly Electronic Signature(s) Signed: 03/16/2020 3:37:29 PM By: [name] EMT/HBOT/SD Entered By: [name] on 03/16/2020 10:46:25 -------------------------------------------------------------------------------- Vitals Details Patient Name: Date of Service: Martin Agee. 03/16/2020 8:00 A M Medical Record Number: [id number] Patient Account Number: [id number] Date of Birth/Sex: Treating RN: Martin [name], Martin [name] (74 y.o. Martin [name], Martin [name] Primary Care Khyre Germond: [name] Other Clinician: [name] Referring Sonita Michiels: Treating Edison Nicholson/Extender: [name] in Treatment: 12 Vital Signs Time Taken: 07:55 Temperature (F): 98 Height (in): 72 Pulse (bpm): 78 Weight (lbs): 270 Respiratory Rate (breaths/min): 16 Body Mass Index (BMI): 36.6 Blood Pressure (mmHg): 159/78 Capillary Blood Glucose (mg/dl): 176 Reference Range: 80 - 120 mg / dl Electronic Signature(s) Signed: 03/16/2020  3:37:29 PM By: [name] EMT/HBOT/SD Entered By: [name] on 03/16/2020 08:02:19

## 2020-03-17 ENCOUNTER — Encounter (HOSPITAL_BASED_OUTPATIENT_CLINIC_OR_DEPARTMENT_OTHER): Payer: PPO | Admitting: Internal Medicine

## 2020-03-20 ENCOUNTER — Encounter (HOSPITAL_BASED_OUTPATIENT_CLINIC_OR_DEPARTMENT_OTHER): Payer: PPO | Admitting: Physician Assistant

## 2020-03-20 ENCOUNTER — Other Ambulatory Visit: Payer: Self-pay

## 2020-03-20 DIAGNOSIS — E11621 Type 2 diabetes mellitus with foot ulcer: Secondary | ICD-10-CM | POA: Diagnosis not present

## 2020-03-20 DIAGNOSIS — L97514 Non-pressure chronic ulcer of other part of right foot with necrosis of bone: Secondary | ICD-10-CM | POA: Diagnosis not present

## 2020-03-20 LAB — GLUCOSE, CAPILLARY
Glucose-Capillary: 185 mg/dL — ABNORMAL HIGH (ref 70–99)
Glucose-Capillary: 177 mg/dL — ABNORMAL HIGH (ref 70–99)

## 2020-03-20 NOTE — Progress Notes (Signed)
Martin, [name] ([id number]) Visit Report for 03/20/2020 Arrival Information Details Patient Name: Date of Service: Martin [name], Martin [name] 03/20/2020 8:00 A M Medical Record Number: [id number] Patient Account Number: [id number] Date of Birth/Sex: Treating RN: 1945-05-24 (74 y.o. [name], [name] Primary Care Malaiya Paczkowski: [name] Other Clinician: [name] Referring Yahaira Bruski: Treating Chancelor Hardrick/Extender: Sandria Bales in Treatment: 12 Visit Information History Since Last Visit Added or deleted any medications: No Patient Arrived: [name] Any new allergies or adverse reactions: No Arrival Time: 08:00 Had a fall or experienced change in No Accompanied By: self activities of daily living that may affect Transfer Assistance: None risk of falls: Patient Identification Verified: Yes Signs or symptoms of abuse/neglect since last visito No Secondary Verification Process Completed: Yes Hospitalized since last visit: No Patient Requires Transmission-Based Precautions: No Implantable device outside of the clinic excluding No Patient Has Alerts: No cellular tissue based products placed in the center since last visit: Pain Present Now: No Electronic Signature(s) Signed: 03/20/2020 3:23:40 PM By: [name] EMT/HBOT/SD Entered By: [name] on 03/20/2020 08:30:15 -------------------------------------------------------------------------------- Encounter Discharge Information Details Patient Name: Date of Service: Martin [name]. 03/20/2020 8:00 A M Medical Record Number: [id number] Patient Account Number: [id number] Date of Birth/Sex: Treating RN: [birth year] (74 y.o. [name] Primary Care Amyr Sluder: [name] Other Clinician: [name] Referring Addelynn Batte: Treating Giulliana Mcroberts/Extender: Sandria Bales in Treatment: 12 Encounter Discharge Information Items Discharge Condition: Stable Ambulatory Status:  Cane Discharge Destination: Home Transportation: Private Auto Accompanied By: self Schedule Follow-up Appointment: Yes Clinical Summary of Care: Patient Declined Electronic Signature(s) Signed: 03/20/2020 3:23:40 PM By: [name] EMT/HBOT/SD Entered By: [name] on 03/20/2020 10:29:24 -------------------------------------------------------------------------------- Patient/Caregiver Education Details Patient Name: Date of Service: Martin, [name] 11/22/2021andnbsp8:00 [place] Record Number: [id number] Patient Account Number: [id number] Date of Birth/Gender: Treating RN: 1946/03/29 (74 y.o. [name] Primary Care Physician: [name] Other Clinician: [name] Referring Physician: Treating Physician/Extender: Sandria Bales in Treatment: 12 Education Assessment Education Provided To: Patient Education Topics Provided Hyperbaric Oxygenation: Methods: Explain/Verbal Responses: State content correctly Electronic Signature(s) Signed: 03/20/2020 3:23:40 PM By: [name] EMT/HBOT/SD Entered By: [name] on 03/20/2020 10:29:11 -------------------------------------------------------------------------------- Vitals Details Patient Name: Date of Service: Martin [name]. 03/20/2020 8:00 A M Medical Record Number: [id number] Patient Account Number: [id number] Date of Birth/Sex: Treating RN: 05/03/1945 (74 y.o. [name] Primary Care Martin [name]: [name] Other Clinician: [name] Referring [name]: Treating [name]/Extender: Sandria Bales in Treatment: 12 Vital Signs Time Taken: 08:05 Temperature (F): 98.4 Height (in): 72 Pulse (bpm): 76 Weight (lbs): 270 Respiratory Rate (breaths/min): 18 Body Mass Index (BMI): 36.6 Blood Pressure (mmHg): 160/79 Capillary Blood Glucose (mg/dl): 185 Reference Range: 80 - 120 mg / dl Electronic Signature(s) Signed: 03/20/2020  3:23:40 PM By: [name] EMT/HBOT/SD Entered By: [name] on 03/20/2020 08:30:37

## 2020-03-20 NOTE — Progress Notes (Addendum)
Martin [name], Martin [name] ([id number]) Visit Report for 03/20/2020 HBO Details Patient Name: Date of Service: Martin [name], Martin [name] 03/20/2020 8:00 A M Medical Record Number: [id number] Patient Account Number: [id number] Date of Birth/Sex: Treating RN: Jan 28, 1946 (74 y.o. [name] Primary Care Javon Snee: [name] Other Clinician: [name] Referring Sarah-Jane Nazario: Treating Kynley Metzger/Extender: Sandria Bales in Treatment: 12 HBO Treatment Course Details Treatment Course Number: 1 Ordering Slevin Gunby: Worthy Keeler T Treatments Ordered: otal 40 HBO Treatment Start Date: 02/22/2020 HBO Indication: Diabetic Ulcer(s) of the Lower Extremity HBO Treatment Details Treatment Number: 17 Patient Type: Outpatient Chamber Type: Monoplace Chamber Serial #: [id number] Treatment Protocol: 2.0 ATA with 90 minutes oxygen, and no air breaks Treatment Details Compression Rate Down: 2.0 psi / minute De-Compression Rate Up: 2.0 psi / minute Air breaks and breathing Decompress Decompress Compress Tx Pressure Begins Reached periods Begins Ends (leave unused spaces blank) Chamber Pressure (ATA 1 2 ------2 1 ) Clock Time (24 hr) 08:28 08:36 - - - - - - 10:06 10:14 Treatment Length: 106 (minutes) Treatment Segments: 4 Vital Signs Capillary Blood Glucose Reference Range: 80 - 120 mg / dl HBO Diabetic Blood Glucose Intervention Range: <131 mg/dl or >249 mg/dl Time Vitals Blood Respiratory Capillary Blood Glucose Pulse Action Type: Pulse: Temperature: Taken: Pressure: Rate: Glucose (mg/dl): Meter #: Oximetry (%) Taken: Pre 08:05 160/79 76 18 98.4 185 Post 10:17 157/79 70 15 98.2 177 Treatment Response Treatment Toleration: Well Treatment Completion Status: Treatment Completed without Adverse Event Additional Procedure Documentation Tissue Sevierity: Necrosis of bone Electronic Signature(s) Signed: 03/20/2020 3:23:40 PM By: [name] EMT/HBOT/SD Signed:  03/22/2020 5:04:05 PM By: Worthy Keeler PA-C Entered By: [name] on 03/20/2020 10:28:43 -------------------------------------------------------------------------------- HBO Safety Checklist Details Patient Name: Date of Service: Martin [name]. 03/20/2020 8:00 A M Medical Record Number: [id number] Patient Account Number: [id number] Date of Birth/Sex: Treating RN: 11-Nov-1945 (74 y.o. [name] Primary Care Bethel Gaglio: [name] Other Clinician: [name] Referring Franky Reier: Treating Mikinzie Maciejewski/Extender: Sandria Bales in Treatment: 12 HBO Safety Checklist Items Safety Checklist Consent Form Signed Patient voided / foley secured and emptied When did you last eato 0700 - apple Last dose of injectable or oral agent insulin Ostomy pouch emptied and vented if applicable NA All implantable devices assessed, documented and approved NA Intravenous access site secured and place NA Valuables secured Linens and cotton and cotton/polyester blend (less than 51% polyester) Personal oil-based products / skin lotions / body lotions removed Wigs or hairpieces removed NA Smoking or tobacco materials removed NA Books / newspapers / magazines / loose paper removed Cologne, aftershave, perfume and deodorant removed Jewelry removed (may wrap wedding band) Make-up removed NA Hair care products removed Battery operated devices (external) removed NA Heating patches and chemical warmers removed NA Titanium eyewear removed NA Nail polish cured greater than 10 hours NA Casting material cured greater than 10 hours Hearing aids removed NA Loose dentures or partials removed NA Prosthetics have been removed NA Patient demonstrates correct use of air break device (if applicable) Patient concerns have been addressed Patient grounding bracelet on and cord attached to chamber Specifics for Inpatients (complete in addition to above) Medication  sheet sent with patient Intravenous medications needed or due during therapy sent with patient Drainage tubes (e.g. nasogastric tube or chest tube secured and vented) Endotracheal or Tracheotomy tube secured Cuff deflated of air and inflated with saline Airway suctioned Electronic Signature(s) Signed: 03/20/2020 8:31:15 AM By: [name] EMT/HBOT/SD Entered By:  [name] on 03/20/2020 08:31:15

## 2020-03-21 ENCOUNTER — Other Ambulatory Visit: Payer: Self-pay | Admitting: Cardiovascular Disease

## 2020-03-21 ENCOUNTER — Encounter (HOSPITAL_BASED_OUTPATIENT_CLINIC_OR_DEPARTMENT_OTHER): Payer: PPO | Admitting: Internal Medicine

## 2020-03-22 ENCOUNTER — Encounter (HOSPITAL_BASED_OUTPATIENT_CLINIC_OR_DEPARTMENT_OTHER): Payer: PPO | Admitting: Physician Assistant

## 2020-03-22 ENCOUNTER — Other Ambulatory Visit: Payer: Self-pay

## 2020-03-22 DIAGNOSIS — L97514 Non-pressure chronic ulcer of other part of right foot with necrosis of bone: Secondary | ICD-10-CM | POA: Diagnosis not present

## 2020-03-22 DIAGNOSIS — E11621 Type 2 diabetes mellitus with foot ulcer: Secondary | ICD-10-CM | POA: Diagnosis not present

## 2020-03-22 DIAGNOSIS — L97522 Non-pressure chronic ulcer of other part of left foot with fat layer exposed: Secondary | ICD-10-CM | POA: Diagnosis not present

## 2020-03-22 LAB — GLUCOSE, CAPILLARY
Glucose-Capillary: 206 mg/dL — ABNORMAL HIGH (ref 70–99)
Glucose-Capillary: 184 mg/dL — ABNORMAL HIGH (ref 70–99)

## 2020-03-22 NOTE — Progress Notes (Signed)
[name], [name] ([id number]) Visit Report for 03/20/2020 SuperBill Details Patient Name: Date of Service: Martin [name], Martin [name] 03/20/2020 Medical Record Number: [id number] Patient Account Number: [id number] Date of Birth/Sex: Treating RN: 24-Dec-1945 (74 y.o. 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

## 2020-03-22 NOTE — Progress Notes (Signed)
CHRISTIAAN, STREBECK ([id number]) Visit Report for 03/22/2020 Arrival Information Details Patient Name: Date of Service: BANNON, GIAMMARCO 03/22/2020 8:00 A M Medical Record Number: [id number] Patient Account Number: [id number] Date of Birth/Sex: Treating RN: 1946/01/13 (74 y.o. [name], [name] Primary Care Deaira Leckey: [name] Other Clinician: [name] Referring Claudis Giovanelli: Treating Vernona Peake/Extender: Sandria Bales in Treatment: 13 Visit Information History Since Last Visit Added or deleted any medications: No Patient Arrived: [name] Any new allergies or adverse reactions: No Arrival Time: 08:00 Had a fall or experienced change in No Accompanied By: self activities of daily living that may affect Transfer Assistance: None risk of falls: Patient Identification Verified: Yes Signs or symptoms of abuse/neglect since last visito No Secondary Verification Process Completed: Yes Hospitalized since last visit: No Patient Requires Transmission-Based Precautions: No Implantable device outside of the clinic excluding No Patient Has Alerts: No cellular tissue based products placed in the center since last visit: Pain Present Now: No Electronic Signature(s) Signed: 03/22/2020 3:24:25 PM By: [name] EMT/HBOT/SD Entered By: [name] on 03/22/2020 08:26:16 -------------------------------------------------------------------------------- Encounter Discharge Information Details Patient Name: Date of Service: [name] 03/22/2020 8:00 A M Medical Record Number: [id number] Patient Account Number: [id number] Date of Birth/Sex: Treating RN: 01-20-1946 (74 y.o. [name] Primary Care Meckenzie Balsley: [name] Other Clinician: [name] Referring [place] Fant: Treating Grayden Burley/Extender: Sandria Bales in Treatment: 13 Encounter Discharge Information Items Discharge Condition: Stable Ambulatory Status:  Cane Discharge Destination: Home Transportation: Private Auto Accompanied By: self Schedule Follow-up Appointment: Yes Clinical Summary of Care: Patient Declined Electronic Signature(s) Signed: 03/22/2020 3:24:25 PM By: [name] EMT/HBOT/SD Entered By: [name] on 03/22/2020 11:40:43 -------------------------------------------------------------------------------- Patient/Caregiver Education Details Patient Name: Date of Service: Fryberger, Zakar W. 11/24/2021andnbsp8:00 A M Medical Record Number: [id number] Patient Account Number: [id number] Date of Birth/Gender: Treating RN: 04-08-1946 (74 y.o. [name] Primary Care Physician: [name] Other Clinician: [name] Referring Physician: Treating Physician/Extender: Sandria Bales in Treatment: 13 Education Assessment Education Provided To: Patient Education Topics Provided Hyperbaric Oxygenation: Methods: Explain/Verbal Responses: State content correctly Electronic Signature(s) Signed: 03/22/2020 3:24:25 PM By: [name] EMT/HBOT/SD Entered By: [name] on 03/22/2020 11:40:30 -------------------------------------------------------------------------------- Vitals Details Patient Name: Date of Service: [name] 03/22/2020 8:00 A M Medical Record Number: [id number] Patient Account Number: [id number] Date of Birth/Sex: Treating RN: [birth year] (74 y.o. [name] Primary Care Romilda Proby: [name] Other Clinician: [name] Referring Shane Badeaux: Treating Katti Pelle/Extender: Sandria Bales in Treatment: 13 Vital Signs Time Taken: 08:05 Temperature (F): 98.2 Height (in): 72 Pulse (bpm): 81 Weight (lbs): 270 Respiratory Rate (breaths/min): 18 Body Mass Index (BMI): 36.6 Blood Pressure (mmHg): 159/71 Capillary Blood Glucose (mg/dl): 206 Reference Range: 80 - 120 mg / dl Electronic Signature(s) Signed: 03/22/2020  3:24:25 PM By: [name] EMT/HBOT/SD Entered By: [name] on 03/22/2020 08:26:33

## 2020-03-22 NOTE — Progress Notes (Signed)
[name], [name] ([id number]) Visit Report for 03/22/2020 Arrival Information Details Patient Name: Date of Service: Martin [name], Martin [name] 03/22/2020 10:00 A M Medical Record Number: [id number] Patient Account Number: [id number] Date of Birth/Sex: Treating RN: Oct 23, 1945 (74 y.o. Martin [name], [name] Primary Care Thursa Emme: [name] Other Clinician: Referring Janice Seales: Treating Davon Abdelaziz/Extender: Sandria Bales in Treatment: 13 Visit Information History Since Last Visit Added or deleted any medications: No Patient Arrived: [name] Any new allergies or adverse reactions: No Arrival Time: 11:00 Had a fall or experienced change in No Accompanied By: self activities of daily living that may affect Transfer Assistance: None risk of falls: Patient Identification Verified: Yes Signs or symptoms of abuse/neglect since last visito No Secondary Verification Process Completed: Yes Hospitalized since last visit: No Patient Requires Transmission-Based Precautions: No Implantable device outside of the clinic excluding No Patient Has Alerts: No cellular tissue based products placed in the center since last visit: Pain Present Now: No Electronic Signature(s) Signed: 03/22/2020 3:24:25 PM By: [name] EMT/HBOT/SD Entered By: [name] on 03/22/2020 10:59:14 -------------------------------------------------------------------------------- Encounter Discharge Information Details Patient Name: Date of Service: Martin [name]. 03/22/2020 10:00 A M Medical Record Number: [id number] Patient Account Number: [id number] Date of Birth/Sex: Treating RN: 04/28/1946 (74 y.o. Martin [name]) Carlene Coria Primary Care Devun Anna: [name] Other Clinician: Referring Massie Mees: Treating [place] Harral/Extender: Sandria Bales in Treatment: 13 Encounter Discharge Information Items Post Procedure Vitals Discharge Condition: Stable Temperature (F): 98.2 Ambulatory  Status: Cane Pulse (bpm): 81 Discharge Destination: Home Respiratory Rate (breaths/min): 18 Transportation: Private Auto Blood Pressure (mmHg): 159/71 Accompanied By: self Schedule Follow-up Appointment: Yes Clinical Summary of Care: Patient Declined Electronic Signature(s) Signed: 03/22/2020 4:15:58 PM By: Carlene Coria RN Entered By: Carlene Coria on 03/22/2020 12:03:53 -------------------------------------------------------------------------------- Lower Extremity Assessment Details Patient Name: Date of Service: [name], Martin [name] 03/22/2020 10:00 A M Medical Record Number: [id number] Patient Account Number: [id number] Date of Birth/Sex: Treating RN: March 01, 1946 (74 y.o. Martin [name]) Carlene Coria Primary Care Maurisha Mongeau: [name] Other Clinician: Referring Zaydyn Havey: Treating Ming Kunka/Extender: Sandria Bales in Treatment: 13 Edema Assessment Assessed: [Left: No] [Right: No] Edema: [Left: Ye] [Right: s] Calf Left: Right: Point of Measurement: From Medial Instep 43 cm Ankle Left: Right: Point of Measurement: From Medial Instep 27 cm Electronic Signature(s) Signed: 03/22/2020 4:15:58 PM By: Carlene Coria RN Entered By: Carlene Coria on 03/22/2020 11:01:23 -------------------------------------------------------------------------------- [place] Details Patient Name: Date of Service: Martin [name]. 03/22/2020 10:00 A M Medical Record Number: [id number] Patient Account Number: [id number] Date of Birth/Sex: Treating RN: November 17, 1945 (74 y.o. Martin [name] Primary Care Martin [name]: [name] Other Clinician: Referring Martin [name]: Treating Martin [name]/Extender: Sandria Bales in Treatment: 13 Active Inactive Nutrition Nursing Diagnoses: Impaired glucose control: actual or potential Potential for alteratiion in Nutrition/Potential for imbalanced nutrition Goals: Patient/caregiver will maintain therapeutic  glucose control Date Initiated: 12/22/2019 Target Resolution Date: 04/19/2020 Goal Status: Active Interventions: Assess HgA1c results as ordered upon admission and as needed Treatment Activities: Patient referred to Primary Care Physician for further nutritional evaluation : 12/22/2019 Notes: Wound/Skin Impairment Nursing Diagnoses: Impaired tissue integrity Knowledge deficit related to ulceration/compromised skin integrity Goals: Patient/caregiver will verbalize understanding of skin care regimen Date Initiated: 12/22/2019 Target Resolution Date: 04/12/2020 Goal Status: Active Ulcer/skin breakdown will have a volume reduction of 30% by week 4 Date Initiated: 12/22/2019 Date Inactivated: 01/19/2020 Target Resolution Date: 01/19/2020 Goal Status: Unmet Unmet Reason: infection Ulcer/skin breakdown will have a volume reduction of 50% by  week 8 Date Initiated: 01/19/2020 Date Inactivated: 02/16/2020 Target Resolution Date: 02/16/2020 Goal Status: Unmet Unmet Reason: infection Ulcer/skin breakdown will have a volume reduction of 80% by week 12 Date Initiated: 02/16/2020 Date Inactivated: 03/15/2020 Target Resolution Date: 03/15/2020 Goal Status: Unmet Unmet Reason: osteo, getting HBO Interventions: Assess patient/caregiver ability to obtain necessary supplies Assess patient/caregiver ability to perform ulcer/skin care regimen upon admission and as needed Assess ulceration(s) every visit Provide education on ulcer and skin care Treatment Activities: Skin care regimen initiated : 12/22/2019 Topical wound management initiated : 12/22/2019 Notes: Electronic Signature(s) Signed: 03/22/2020 4:58:27 PM By: Baruch Gouty RN, BSN Entered By: Baruch Gouty on 03/22/2020 11:32:35 -------------------------------------------------------------------------------- Pain Assessment Details Patient Name: Date of Service: Martin [name]. 03/22/2020 10:00 A M Medical Record Number:  [id number] Patient Account Number: [id number] Date of Birth/Sex: Treating RN: [birth year] (74 y.o. Martin [name] Primary Care Quin Mathenia: [name] Other Clinician: Referring Lanier Felty: Treating Tequlia Gonsalves/Extender: Sandria Bales in Treatment: 13 Active Problems Location of Pain Severity and Description of Pain Patient Has Paino No Site Locations Pain Management and Medication Current Pain Management: Electronic Signature(s) Signed: 03/22/2020 4:15:58 PM By: Carlene Coria RN Entered By: Carlene Coria on 03/22/2020 11:00:52 -------------------------------------------------------------------------------- Patient/Caregiver Education Details Patient Name: Date of Service: Andal, Phyllip W. 11/24/2021andnbsp10:00 A M Medical Record Number: [id number] Patient Account Number: [id number] Date of Birth/Gender: Treating RN: 14-Feb-1946 (74 y.o. Martin [name] Primary Care Physician: [name] Other Clinician: Referring Physician: Treating Physician/Extender: Sandria Bales in Treatment: 13 Education Assessment Education Provided To: Patient Education Topics Provided Offloading: Methods: Explain/Verbal Responses: Reinforcements needed, State content correctly Wound/Skin Impairment: Methods: Explain/Verbal Responses: Reinforcements needed, State content correctly Electronic Signature(s) Signed: 03/22/2020 4:58:27 PM By: Baruch Gouty RN, BSN Entered By: Baruch Gouty on 03/22/2020 11:34:24 -------------------------------------------------------------------------------- Wound Assessment Details Patient Name: Date of Service: Martin [name]. 03/22/2020 10:00 A M Medical Record Number: [id number] Patient Account Number: [id number] Date of Birth/Sex: Treating RN: 02-07-1946 (74 y.o. Martin [name]) Carlene Coria Primary Care Alexandra Posadas: [name] Other Clinician: Referring Devoiry Corriher: Treating Camaron Cammack/Extender: Sandria Bales in Treatment: 13 Wound Status Wound Number: 5 Primary Diabetic Wound/Ulcer of the Lower Extremity Etiology: Wound Location: Left Metatarsal head fifth Wound Open Wounding Event: Gradually Appeared Status: Date Acquired: 10/31/2019 Comorbid Cataracts, Coronary Artery Disease, Hypertension, Type II Weeks Of Treatment: 13 History: Diabetes, Neuropathy Clustered Wound: No Wound Measurements Length: (cm) 0.9 Width: (cm) 0.7 Depth: (cm) 0.3 Area: (cm) 0.495 Volume: (cm) 0.148 % Reduction in Area: 59.1% % Reduction in Volume: 82.5% Epithelialization: Medium (34-66%) Tunneling: No Undermining: Yes Starting Position (o'clock): 11 Ending Position (o'clock): 6 Maximum Distance: (cm) 0.6 Wound Description Classification: Grade 3 Wound Margin: Epibole Exudate Amount: Small Exudate Type: Serous Exudate Color: amber Foul Odor After Cleansing: No Slough/Fibrino No Wound Bed Granulation Amount: Large (67-100%) Exposed Structure Granulation Quality: Pink, Pale Fascia Exposed: No Necrotic Amount: None Present (0%) Fat Layer (Subcutaneous Tissue) Exposed: Yes Tendon Exposed: No Muscle Exposed: No Joint Exposed: No Bone Exposed: No Treatment Notes Wound #5 (Left Metatarsal head fifth) 1. Cleanse With Wound Cleanser 3. Primary Dressing Applied Calcium Alginate Ag 4. Secondary Dressing Dry Gauze Foam 5. Secured With Tape 7. Footwear/Offloading device applied T Contact Cast otal Notes foam donut. extra padding to left foot. last layer of TCC applied by PA. Electronic Signature(s) Signed: 03/22/2020 4:15:58 PM By: Carlene Coria RN Entered By: Carlene Coria on 03/22/2020 11:04:52 -------------------------------------------------------------------------------- Vitals Details Patient Name: Date of Service: Baldi, Harvis  W. 03/22/2020 10:00 A M Medical Record Number: [id number] Patient Account Number: [id number] Date of Birth/Sex: Treating  RN: Jun 22, 1945 (74 y.o. Martin [name] Primary Care Ramiyah Mcclenahan: [name] Other Clinician: Referring Riyana Biel: Treating Faryal Marxen/Extender: Sandria Bales in Treatment: 13 Vital Signs Time Taken: 11:00 Temperature (F): 98.2 Height (in): 72 Pulse (bpm): 81 Weight (lbs): 270 Respiratory Rate (breaths/min): 18 Body Mass Index (BMI): 36.6 Blood Pressure (mmHg): 159/71 Capillary Blood Glucose (mg/dl): 206 Reference Range: 80 - 120 mg / dl Electronic Signature(s) Signed: 03/22/2020 3:24:25 PM By: [name] EMT/HBOT/SD Entered By: [name] on 03/22/2020 10:59:41

## 2020-03-22 NOTE — Progress Notes (Signed)
ARGYLE, GUSTAFSON ([id number]) Visit Report for 03/22/2020 SuperBill Details Patient Name: Date of Service: Martin [name], Martin [name] 03/22/2020 Medical Record Number: [id number] Patient Account Number: [id number] Date of Birth/Sex: Treating RN: 1946-02-01 (74 y.o. 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

## 2020-03-22 NOTE — Progress Notes (Addendum)
NOBEL, BRAR ([id number]) Visit Report for 03/22/2020 HBO Details Patient Name: Date of Service: Martin [name], Martin [name] 03/22/2020 8:00 A M Medical Record Number: [id number] Patient Account Number: [id number] Date of Birth/Sex: Treating RN: 01-May-1945 (74 y.o. Martin [name] Primary Care Jamie-Lee Galdamez: [name] Other Clinician: [name] Referring Isaura Schiller: Treating Noemy Hallmon/Extender: Sandria Bales in Treatment: 13 HBO Treatment Course Details Treatment Course Number: 1 Ordering Allis Quirarte: Worthy Keeler T Treatments Ordered: otal 40 HBO Treatment Start Date: 02/22/2020 HBO Indication: Diabetic Ulcer(s) of the Lower Extremity HBO Treatment Details Treatment Number: 18 Patient Type: Outpatient Chamber Type: Monoplace Chamber Serial #: [id number] Treatment Protocol: 2.0 ATA with 90 minutes oxygen, and no air breaks Treatment Details Compression Rate Down: 2.0 psi / minute De-Compression Rate Up: 2.0 psi / minute Air breaks and breathing Decompress Decompress Compress Tx Pressure Begins Reached periods Begins Ends (leave unused spaces blank) Chamber Pressure (ATA 1 2 ------2 1 ) Clock Time (24 hr) 08:29 08:37 - - - - - - 10:07 10:15 Treatment Length: 106 (minutes) Treatment Segments: 4 Vital Signs Capillary Blood Glucose Reference Range: 80 - 120 mg / dl HBO Diabetic Blood Glucose Intervention Range: <131 mg/dl or >249 mg/dl Time Vitals Blood Respiratory Capillary Blood Glucose Pulse Action Type: Pulse: Temperature: Taken: Pressure: Rate: Glucose (mg/dl): Meter #: Oximetry (%) Taken: Pre 08:05 159/71 81 18 98.2 206 Post 10:17 166/80 75 78 98 184 Treatment Response Treatment Toleration: Well Treatment Completion Status: Treatment Completed without Adverse Event Additional Procedure Documentation Tissue Sevierity: Necrosis of bone Electronic Signature(s) Signed: 03/22/2020 3:24:25 PM By: [name] EMT/HBOT/SD Signed:  03/22/2020 5:04:05 PM By: Worthy Keeler PA-C Entered By: [name] on 03/22/2020 11:39:58 -------------------------------------------------------------------------------- HBO Safety Checklist Details Patient Name: Date of Service: Martin [name]. 03/22/2020 8:00 A M Medical Record Number: [id number] Patient Account Number: [id number] Date of Birth/Sex: Treating RN: 15-Mar-1946 (74 y.o. Martin [name] Primary Care Shabnam Ladd: [name] Other Clinician: [name] Referring Shaymus Eveleth: Treating Haddie Bruhl/Extender: Sandria Bales in Treatment: 13 HBO Safety Checklist Items Safety Checklist Consent Form Signed Patient voided / foley secured and emptied When did you last eato n/a Last dose of injectable or oral agent insulin Ostomy pouch emptied and vented if applicable NA All implantable devices assessed, documented and approved NA Intravenous access site secured and place NA Valuables secured Linens and cotton and cotton/polyester blend (less than 51% polyester) Personal oil-based products / skin lotions / body lotions removed Wigs or hairpieces removed NA Smoking or tobacco materials removed NA Books / newspapers / magazines / loose paper removed Cologne, aftershave, perfume and deodorant removed Jewelry removed (may wrap wedding band) Make-up removed NA Hair care products removed Battery operated devices (external) removed NA Heating patches and chemical warmers removed NA Titanium eyewear removed NA Nail polish cured greater than 10 hours NA Casting material cured greater than 10 hours Hearing aids removed NA Loose dentures or partials removed NA Prosthetics have been removed NA Patient demonstrates correct use of air break device (if applicable) Patient concerns have been addressed Patient grounding bracelet on and cord attached to chamber Specifics for Inpatients (complete in addition to above) Medication sheet sent  with patient Intravenous medications needed or due during therapy sent with patient Drainage tubes (e.g. nasogastric tube or chest tube secured and vented) Endotracheal or Tracheotomy tube secured Cuff deflated of air and inflated with saline Airway suctioned Electronic Signature(s) Signed: 03/22/2020 8:30:05 AM By: [name] EMT/HBOT/SD Entered By: [name]  on 03/22/2020 08:30:04

## 2020-03-22 NOTE — Progress Notes (Addendum)
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 healing possibilities. Objective Constitutional Well-nourished and well-hydrated in no acute distress. Vitals Time Taken:  11:00 AM, Height: 72 in, Weight: 270 lbs, BMI: 36.6, Temperature: 98.2 F, Pulse: 81 bpm, Respiratory Rate: 18 breaths/min, Blood Pressure: 159/71 mmHg, Capillary Blood Glucose: 206 mg/dl. Respiratory normal breathing without difficulty. Psychiatric this patient is able to make decisions and demonstrates good insight into disease process. Alert and Oriented x 3. pleasant and cooperative. General Notes: Patient's wound bed actually showed signs of good granulation at this time. Fortunately there is no evidence of active infection which is great news and overall very pleased with where things stand today. With that being said I do think that we need to clear away some of this tissue along the medial border of the wound bed and the patient is in agreement with the plan. For that reason I did perform sharp debridement using scissors and forceps to clear away this tissue. It did appear there was some tendon underneath and this may have been necrotic tendon over top as well most likely based on what I am seeing. Fortunately there is no signs of active infection at this time. I did use silver nitrate to chemically cauterize the area. Integumentary (Hair, Skin) Wound #5 status is Open. Original cause of wound was Gradually Appeared. The wound is located on the Left Metatarsal head fifth. The wound measures 0.9cm length x 0.7cm width x 0.3cm depth; 0.495cm^2 area and 0.148cm^3 volume. There is Fat Layer (Subcutaneous Tissue) exposed. There is no tunneling noted, however, there is undermining starting at 11:00 and ending at 6:00 with a maximum distance of 0.6cm. There is a small amount of serous drainage noted. The wound margin is epibole. There is large (67-100%) pink, pale granulation  within the wound bed. There is no necrotic tissue within the wound bed. 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 There was a Excisional Skin/Subcutaneous Tissue/Muscle Debridement with a total area of 0.63 sq cm performed by Martin Keeler, PA. With the following instrument(s): Forceps, and Scissors to remove Viable and Non-Viable tissue/material. Material removed includes Tendon and Subcutaneous Tissue and. No specimens were taken. A time out was conducted at 11:35, prior to the start of the procedure. A Minimum amount of bleeding was controlled with Silver Nitrate. The procedure was tolerated well with a pain level of 0 throughout and a pain level of 0 following the procedure. Post Debridement Measurements: 0.9cm length x 0.7cm width x 0.3cm depth; 0.148cm^3 volume. Character of Wound/Ulcer Post Debridement is improved. Severity of Tissue Post Debridement is: Fat layer exposed. Post procedure Diagnosis Wound #5: Same as Pre-Procedure Pre-procedure diagnosis of Wound #5 is a Diabetic Wound/Ulcer of the Lower Extremity located on the Left Metatarsal head fifth . There was a T Contact otal Cast Procedure by Martin Keeler, PA. Post procedure Diagnosis Wound #5: Same as Pre-Procedure Plan Follow-up Appointments: Return Appointment in 1 week. Dressing Change Frequency: Wound #5 Left Metatarsal head fifth: Do not change entire dressing for one week. Skin Barriers/Peri-Wound Care: Antifungal powder - nystatin powder to groins 2 times per day as needed for rash Wound Cleansing: Wound #5 Left Metatarsal head fifth: May shower with protection. Primary Wound Dressing: Wound #5 Left Metatarsal head fifth: Calcium Alginate with Silver -  pack into wound Secondary Dressing: Wound #5 Left Metatarsal head fifth: Foam - donut Dry Gauze Off-Loading: T Contact Cast to Left Lower Extremity otal Additional Orders / Instructions: Follow Nutritious Diet Other: - take one probiotic capsule daily Hyperbaric Oxygen Therapy: Evaluate for HBO Therapy Indication: - wagner grade 3 diabetic foot ulcer left 5th met head If appropriate for treatment, begin HBOT per protocol: 2.0 ATA for 90 Minutes without Air Breaks T Number of Treatments: - 40 otal One treatments per day (delivered Monday through Friday unless otherwise specified in Special Instructions below): Finger stick Blood Glucose Pre- and Post- HBOT Treatment. Follow Hyperbaric Oxygen Glycemia Protocol Afrin (Oxymetazoline HCL) 0.05% nasal spray - 1 spray in both nostrils daily as needed prior to HBO treatment for difficulty clearing ears 1. I would recommend currently that we going to continue with the wound care measures as before specifically with regard to the total contact cast which I think is helping the patient. 2. Is also get a continue with hyperbaric oxygen therapy. 3. I would also recommend that the patient continue to use actually a silver alginate dressing for the time being I do not think there is no immediate point of using collagen today after the chemical cauterization with silver nitrate was performed. We will see patient back for reevaluation in 1 week here in the clinic. If anything worsens or changes patient will contact our office for additional recommendations. Electronic Signature(s) Signed: 03/22/2020 11:44:54 AM By: Martin Keeler PA-C Entered By: Martin [name] on 03/22/2020 11:44:53 -------------------------------------------------------------------------------- Total Contact Cast Details Patient Name: Date  of Service: Martin [name], Martin [name] 03/22/2020 10:00 A M Medical Record Number: [id number] Patient Account Number: [id number] Date of Birth/Sex:  Treating RN: July 27, 1945 (74 y.o. Martin [name] Primary Care Provider: Garret [name] Other Clinician: Referring Provider: Treating Provider/Extender: Martin [name] in Treatment: 88 T Contact Cast Applied for Wound Assessment: otal Wound #5 Left Metatarsal head fifth Performed By: Physician Martin Keeler, PA Post Procedure Diagnosis Same as Pre-procedure Electronic Signature(s) Signed: 03/22/2020 4:58:27 PM By: Baruch Gouty RN, BSN Signed: 03/22/2020 5:04:05 PM By: Martin Keeler PA-C Entered By: Baruch Gouty on 03/22/2020 11:32:04 -------------------------------------------------------------------------------- SuperBill Details Patient Name: Date of Service: Martin Agee. 03/22/2020 Medical Record Number: [id number] Patient Account Number: [id number] Date of Birth/Sex: Treating RN: April 10, 1946 (74 y.o. Martin [name] Primary Care Provider: Garret [name] Other Clinician: Referring Provider: Treating Provider/Extender: Martin [name] in Treatment: 13 Diagnosis Coding ICD-10 Codes Code Description E11.621 Type 2 diabetes mellitus with foot ulcer L97.522 Non-pressure chronic ulcer of other part of left foot with fat layer exposed I10 Essential (primary) hypertension I25.10 Atherosclerotic heart disease of native coronary artery without angina pectoris M86.272 Subacute osteomyelitis, left ankle and foot Facility Procedures CPT4 Code: [id number] Description: 17001 - DEB MUSC/FASCIA 20 SQ CM/< ICD-10 Diagnosis Description L97.522 Non-pressure chronic ulcer of other part of left foot with fat layer exposed Modifier: Quantity: 1 Physician Procedures : CPT4 Code Description Modifier [id number] 75916 - WC PHYS DEBR MUSCLE/FASCIA 20 SQ CM ICD-10 Diagnosis Description L97.522 Non-pressure chronic ulcer of other part of left foot with fat layer exposed Quantity: 1 Electronic Signature(s) Signed: 03/22/2020 11:45:01 AM By:  Martin Keeler PA-C Entered By: Martin [name] on 03/22/2020 11:45:00

## 2020-03-24 ENCOUNTER — Encounter (HOSPITAL_BASED_OUTPATIENT_CLINIC_OR_DEPARTMENT_OTHER): Payer: PPO | Admitting: Internal Medicine

## 2020-03-27 ENCOUNTER — Other Ambulatory Visit (HOSPITAL_COMMUNITY)
Admission: RE | Admit: 2020-03-27 | Discharge: 2020-03-27 | Disposition: A | Discharge status: Home / Self Care | Payer: PPO | Source: Other Acute Inpatient Hospital | Attending: Internal Medicine | Admitting: Internal Medicine

## 2020-03-27 ENCOUNTER — Encounter (HOSPITAL_BASED_OUTPATIENT_CLINIC_OR_DEPARTMENT_OTHER): Payer: PPO | Admitting: Internal Medicine

## 2020-03-27 ENCOUNTER — Other Ambulatory Visit: Payer: Self-pay

## 2020-03-27 DIAGNOSIS — E11621 Type 2 diabetes mellitus with foot ulcer: Secondary | ICD-10-CM | POA: Insufficient documentation

## 2020-03-27 DIAGNOSIS — L97522 Non-pressure chronic ulcer of other part of left foot with fat layer exposed: Secondary | ICD-10-CM | POA: Diagnosis not present

## 2020-03-27 DIAGNOSIS — L97514 Non-pressure chronic ulcer of other part of right foot with necrosis of bone: Secondary | ICD-10-CM | POA: Diagnosis not present

## 2020-03-27 LAB — GLUCOSE, CAPILLARY
Glucose-Capillary: 191 mg/dL — ABNORMAL HIGH (ref 70–99)
Glucose-Capillary: 155 mg/dL — ABNORMAL HIGH (ref 70–99)

## 2020-03-27 NOTE — Progress Notes (Signed)
Martin [name], Martin [name] ([id number]) Visit Report for 03/27/2020 SuperBill Details Patient Name: Date of Service: Martin [name], Martin [name] 03/27/2020 Medical Record Number: [id number] Patient Account Number: [id number] Date of Birth/Sex: Treating RN: 23-Jan-1946 (74 y.o. [name] Primary Care Provider: [name] Other Clinician: [name] Referring Provider: Treating Provider/Extender: [name] in Treatment: 13 Diagnosis Coding ICD-10 Codes Code Description [phone] Type 2 diabetes mellitus with foot ulcer L97.522 Non-pressure chronic ulcer of other part of left foot with fat layer exposed I10 Essential (primary) hypertension I25.10 Atherosclerotic heart disease of native coronary artery without angina pectoris M86.272 Subacute osteomyelitis, left ankle and foot Facility Procedures CPT4 Code Description Modifier Quantity [id number] G0277-(Facility Use Only) HBOT full body chamber, 66min , 4 Physician Procedures Quantity CPT4 Code Description Modifier [id number] 60156 - WC PHYS HYPERBARIC OXYGEN THERAPY 1 ICD-10 Diagnosis Description E11.621 Type 2 diabetes mellitus with foot ulcer Electronic Signature(s) Signed: 03/27/2020 3:33:47 PM By: [name] EMT/HBOT/SD Signed: 03/27/2020 4:47:57 PM By: Linton Ham MD Entered By: [name] on 03/27/2020 10:43:32

## 2020-03-27 NOTE — Progress Notes (Signed)
Martin, [name] ([id number]) Visit Report for 03/27/2020 Arrival Information Details Patient Name: Date of Service: Martin [name], ESSELMAN 03/27/2020 8:00 A M Medical Record Number: [id number] Patient Account Number: [id number] Date of Birth/Sex: Treating RN: 07-Dec-1945 (74 y.o. Martin Eva, Briant [name] Primary Care [name]: [name] Other Clinician: [name] Referring Yvan Dority: Treating Io Dieujuste/Extender: [name] in Treatment: 40 Visit Information History Since Last Visit Added or deleted any medications: No Patient Arrived: Martin [name] Any new allergies or adverse reactions: No Arrival Time: 08:05 Had a fall or experienced change in No Accompanied By: self activities of daily living that may affect Transfer Assistance: None risk of falls: Patient Identification Verified: Yes Signs or symptoms of abuse/neglect since last visito No Secondary Verification Process Completed: Yes Hospitalized since last visit: No Patient Requires Transmission-Based Precautions: No Implantable device outside of the clinic excluding No Patient Has Alerts: No cellular tissue based products placed in the center since last visit: Pain Present Now: No Electronic Signature(s) Signed: 03/27/2020 3:33:47 PM By: [name] EMT/HBOT/SD Entered By: [name] on 03/27/2020 08:28:50 -------------------------------------------------------------------------------- Encounter Discharge Information Details Patient Name: Date of Service: Martin [name]. 03/27/2020 8:00 A M Medical Record Number: [id number] Patient Account Number: [id number] Date of Birth/Sex: Treating RN: [birth year] (74 y.o. [name] Primary Care Angellica Maddison: [name] Other Clinician: [name] Referring Clare Casto: Treating Massiel Stipp/Extender: [name] in Treatment: 13 Encounter Discharge Information Items Discharge Condition: Stable Ambulatory Status:  Cane Discharge Destination: Home Transportation: Private Auto Accompanied By: self Schedule Follow-up Appointment: Yes Clinical Summary of Care: Patient Declined Electronic Signature(s) Signed: 03/27/2020 3:33:47 PM By: [name] EMT/HBOT/SD Entered By: [name] on 03/27/2020 10:44:03 -------------------------------------------------------------------------------- Patient/Caregiver Education Details Patient Name: Date of Service: Martin, [name] 11/29/2021andnbsp8:00 [place] Record Number: [id number] Patient Account Number: [id number] Date of Birth/Gender: Treating RN: [birth year] (74 y.o. [name] Primary Care Physician: [name] Other Clinician: [name] Referring Physician: Treating Physician/Extender: [name] in Treatment: 13 Education Assessment Education Provided To: Patient Education Topics Provided Hyperbaric Oxygenation: Methods: Explain/Verbal Responses: State content correctly Electronic Signature(s) Signed: 03/27/2020 3:33:47 PM By: [name] EMT/HBOT/SD Entered By: [name] on 03/27/2020 10:43:47 -------------------------------------------------------------------------------- Vitals Details Patient Name: Date of Service: Martin [name]. 03/27/2020 8:00 A M Medical Record Number: [id number] Patient Account Number: [id number] Date of Birth/Sex: Treating RN: [birth year] (74 y.o. [name] Primary Care Rada Zegers: [name] Other Clinician: [name] Referring Shanetra Blumenstock: Treating Kenlyn Lose/Extender: [name] in Treatment: 13 Vital Signs Time Taken: 08:10 Temperature (F): 98.2 Height (in): 72 Pulse (bpm): 74 Weight (lbs): 270 Respiratory Rate (breaths/min): 18 Body Mass Index (BMI): 36.6 Blood Pressure (mmHg): 161/71 Capillary Blood Glucose (mg/dl): 191 Reference Range: 80 - 120 mg / dl Electronic Signature(s) Signed: 03/27/2020  3:33:47 PM By: [name] EMT/HBOT/SD Entered By: [name] on 03/27/2020 24:40:10

## 2020-03-27 NOTE — Progress Notes (Addendum)
SORA, OLIVO ([id number]) Visit Report for 03/27/2020 HBO Details Patient Name: Date of Service: Martin [name], Martin [name] 03/27/2020 8:00 A M Medical Record Number: [id number] Patient Account Number: [id number] Date of Birth/Sex: Treating RN: 08-02-1945 (74 y.o. [name] Primary Care Amaiyah Nordhoff: [name] Other Clinician: [name] Referring Francyne Arreaga: Treating Sanah Kraska/Extender: [name] in Treatment: 13 HBO Treatment Course Details Treatment Course Number: 1 Ordering Trenten Watchman: Worthy Keeler T Treatments Ordered: otal 40 HBO Treatment Start Date: 02/22/2020 HBO Indication: Diabetic Ulcer(s) of the Lower Extremity HBO Treatment Details Treatment Number: 19 Patient Type: Outpatient Chamber Type: Monoplace Chamber Serial #: [id number] Treatment Protocol: 2.0 ATA with 90 minutes oxygen, and no air breaks Treatment Details Compression Rate Down: 2.0 psi / minute De-Compression Rate Up: 2.0 psi / minute Air breaks and breathing Decompress Decompress Compress Tx Pressure Begins Reached periods Begins Ends (leave unused spaces blank) Chamber Pressure (ATA 1 2 ------2 1 ) Clock Time (24 hr) 08:36 08:44 - - - - - - 10:14 10:22 Treatment Length: 106 (minutes) Treatment Segments: 4 Vital Signs Capillary Blood Glucose Reference Range: 80 - 120 mg / dl HBO Diabetic Blood Glucose Intervention Range: <131 mg/dl or >249 mg/dl Time Vitals Blood Respiratory Capillary Blood Glucose Pulse Action Type: Pulse: Temperature: Taken: Pressure: Rate: Glucose (mg/dl): Meter #: Oximetry (%) Taken: Pre 08:10 161/71 74 18 98.2 191 Post 10:25 155/81 76 16 98 155 Treatment Response Treatment Toleration: Well Treatment Completion Status: Treatment Completed without Adverse Event Additional Procedure Documentation Tissue Sevierity: Necrosis of bone May Manrique Notes Tolerated hyperbaric treatment well. However he complained of pain in difficulty walking  in the cast around the wound on the fifth metatarsal head. He was therefore worked into the clinic schedule. A separate record was dictated. Physician HBO Attestation: I certify that I supervised this HBO treatment in accordance with Medicare guidelines. A trained emergency response team is readily available per Yes hospital policies and procedures. Continue HBOT as ordered. Yes Electronic Signature(s) Signed: 03/27/2020 4:47:57 PM By: Linton Ham MD Signed: 03/27/2020 4:47:57 PM By: Linton Ham MD Previous Signature: 03/27/2020 3:33:47 PM Version By: [name] EMT/HBOT/SD Entered By: Linton Ham on 03/27/2020 16:43:55 -------------------------------------------------------------------------------- HBO Safety Checklist Details Patient Name: Date of Service: Martin [name]. 03/27/2020 8:00 A M Medical Record Number: [id number] Patient Account Number: [id number] Date of Birth/Sex: Treating RN: 11/14/1945 (74 y.o. [name] Primary Care Stetson Pelaez: [name] Other Clinician: [name] Referring Ali Mohl: Treating Jubal Rademaker/Extender: [name] in Treatment: 13 HBO Safety Checklist Items Safety Checklist Consent Form Signed Patient voided / foley secured and emptied When did you last eato 0700 - English muffin / juice Last dose of injectable or oral agent insulin Ostomy pouch emptied and vented if applicable NA All implantable devices assessed, documented and approved NA Intravenous access site secured and place NA Valuables secured Linens and cotton and cotton/polyester blend (less than 51% polyester) Personal oil-based products / skin lotions / body lotions removed Wigs or hairpieces removed NA Smoking or tobacco materials removed NA Books / newspapers / magazines / loose paper removed Cologne, aftershave, perfume and deodorant removed Jewelry removed (may wrap wedding band) Make-up removed NA Hair care  products removed Battery operated devices (external) removed NA Heating patches and chemical warmers removed NA Titanium eyewear removed NA Nail polish cured greater than 10 hours NA Casting material cured greater than 10 hours Hearing aids removed NA Loose dentures or partials removed NA Prosthetics have been removed NA Patient demonstrates  correct use of air break device (if applicable) Patient concerns have been addressed Patient grounding bracelet on and cord attached to chamber Specifics for Inpatients (complete in addition to above) Medication sheet sent with patient Intravenous medications needed or due during therapy sent with patient Drainage tubes (e.g. nasogastric tube or chest tube secured and vented) Endotracheal or Tracheotomy tube secured Cuff deflated of air and inflated with saline Airway suctioned Electronic Signature(s) Signed: 03/27/2020 8:30:14 AM By: [name] EMT/HBOT/SD Entered By: [name] on 03/27/2020 08:30:14

## 2020-03-28 ENCOUNTER — Encounter (HOSPITAL_BASED_OUTPATIENT_CLINIC_OR_DEPARTMENT_OTHER): Payer: PPO | Admitting: Internal Medicine

## 2020-03-28 ENCOUNTER — Other Ambulatory Visit: Payer: Self-pay

## 2020-03-28 ENCOUNTER — Ambulatory Visit
Admission: RE | Admit: 2020-03-28 | Discharge: 2020-03-28 | Disposition: A | Discharge status: Home / Self Care | Payer: PPO | Source: Ambulatory Visit | Attending: Internal Medicine | Admitting: Internal Medicine

## 2020-03-28 DIAGNOSIS — E11621 Type 2 diabetes mellitus with foot ulcer: Secondary | ICD-10-CM | POA: Diagnosis not present

## 2020-03-28 DIAGNOSIS — L97514 Non-pressure chronic ulcer of other part of right foot with necrosis of bone: Secondary | ICD-10-CM | POA: Diagnosis not present

## 2020-03-28 DIAGNOSIS — M7989 Other specified soft tissue disorders: Secondary | ICD-10-CM | POA: Diagnosis not present

## 2020-03-28 DIAGNOSIS — L97529 Non-pressure chronic ulcer of other part of left foot with unspecified severity: Secondary | ICD-10-CM

## 2020-03-28 DIAGNOSIS — M85872 Other specified disorders of bone density and structure, left ankle and foot: Secondary | ICD-10-CM | POA: Diagnosis not present

## 2020-03-28 LAB — GLUCOSE, CAPILLARY
Glucose-Capillary: 189 mg/dL — ABNORMAL HIGH (ref 70–99)
Glucose-Capillary: 163 mg/dL — ABNORMAL HIGH (ref 70–99)

## 2020-03-28 NOTE — Progress Notes (Signed)
PHILLIPS, GOULETTE ([id number]) Visit Report for 03/28/2020 SuperBill Details Patient Name: Date of Service: Martin [name], Martin [name] 03/28/2020 Medical Record Number: [id number] Patient Account Number: [id number] Date of Birth/Sex: Treating RN: [birth year] (74 y.o. [name], [name] Primary Care Provider: [name] Other Clinician: Referring Provider: Treating Provider/Extender: [name] in Treatment: 13 Diagnosis Coding ICD-10 Codes Code Description [phone] Type 2 diabetes mellitus with foot ulcer L97.522 Non-pressure chronic ulcer of other part of left foot with fat layer exposed I10 Essential (primary) hypertension I25.10 Atherosclerotic heart disease of native coronary artery without angina pectoris M86.272 Subacute osteomyelitis, left ankle and foot Facility Procedures CPT4 Code Description Modifier Quantity [id number] 99213 - WOUND CARE VISIT-LEV 3 EST PT 1 Electronic Signature(s) Signed: 03/28/2020 5:52:12 PM By: Rhae Hammock RN Signed: 03/28/2020 5:54:56 PM By: Linton Ham MD Entered By: Rhae Hammock on 03/28/2020 08:12:30

## 2020-03-28 NOTE — Progress Notes (Signed)
Martin [name], Martin [name] ([id number]) Visit Report for 03/27/2020 Arrival Information Details Patient Name: Date of Service: Martin [name], Martin [name] 03/27/2020 11:00 A M Medical Record Number: [id number] Patient Account Number: [id number] Date of Birth/Sex: Treating RN: 26-Nov-1945 (74 y.o. Martin [name] Primary Care Martin [name]: Martin [name] Other Clinician: Referring Martin [name]: Treating Martin [name]/Extender: Martin [name] in Treatment: 16 Visit Information History Since Last Visit Added or deleted any medications: No Patient Arrived: Martin [name] Any new allergies or adverse reactions: No Arrival Time: 11:14 Had a fall or experienced change in No Accompanied By: alone activities of daily living that may affect Transfer Assistance: None risk of falls: Patient Identification Verified: Yes Signs or symptoms of abuse/neglect since last visito No Secondary Verification Process Completed: Yes Hospitalized since last visit: No Patient Requires Transmission-Based Precautions: No Implantable device outside of the clinic excluding No Patient Has Alerts: No cellular tissue based products placed in the center since last visit: Has Dressing in Place as Prescribed: Yes Has Footwear/Offloading in Place as Prescribed: Yes Left: T Contact Cast otal Pain Present Now: Yes Electronic Signature(s) Signed: 03/27/2020 3:33:47 PM By: Martin [name] Entered By: Martin [name] on 03/27/2020 11:15:59 -------------------------------------------------------------------------------- Clinic Level of Care Assessment Details Patient Name: Date of Service: Martin [name], Martin [name] 03/27/2020 11:00 A M Medical Record Number: [id number] Patient Account Number: [id number] Date of Birth/Sex: Treating RN: 13-Jun-1945 (74 y.o. Martin [name] Primary Care Martin [name]: Martin [name] Other Clinician: Referring Martin [name]: Treating Martin [name]/Extender: Martin [name] in Treatment:  13 Clinic Level of Care Assessment Items TOOL 4 Quantity Score X- 1 0 Use when only an EandM is performed on FOLLOW-UP visit ASSESSMENTS - Nursing Assessment / Reassessment X- 1 10 Reassessment of Co-morbidities (includes updates in patient status) X- 1 5 Reassessment of Adherence to Treatment Plan ASSESSMENTS - Wound and Skin A ssessment / Reassessment X - Simple Wound Assessment / Reassessment - one wound 1 5 []  - 0 Complex Wound Assessment / Reassessment - multiple wounds []  - 0 Dermatologic / Skin Assessment (not related to wound area) ASSESSMENTS - Focused Assessment X- 1 5 Circumferential Edema Measurements - multi extremities []  - 0 Nutritional Assessment / Counseling / Intervention X- 1 5 Lower Extremity Assessment (monofilament, tuning fork, pulses) []  - 0 Peripheral Arterial Disease Assessment (using hand held doppler) ASSESSMENTS - Ostomy and/or Continence Assessment and Care []  - 0 Incontinence Assessment and Management []  - 0 Ostomy Care Assessment and Management (repouching, etc.) PROCESS - Coordination of Care X - Simple Patient / Family Education for ongoing care 1 15 []  - 0 Complex (extensive) Patient / Family Education for ongoing care X- 1 10 Staff obtains Programmer, systems, Records, T Results / Process Orders est []  - 0 Staff telephones HHA, Nursing Homes / Clarify orders / etc []  - 0 Routine Transfer to another Facility (non-emergent condition) []  - 0 Routine Hospital Admission (non-emergent condition) []  - 0 New Admissions / Biomedical engineer / Ordering NPWT Apligraf, etc. , []  - 0 Emergency Hospital Admission (emergent condition) X- 1 10 Simple Discharge Coordination []  - 0 Complex (extensive) Discharge Coordination PROCESS - Special Needs []  - 0 Pediatric / Minor Patient Management []  - 0 Isolation Patient Management []  - 0 Hearing / Language / Visual special needs []  - 0 Assessment of Community assistance (transportation, D/C  planning, etc.) []  - 0 Additional assistance / Altered mentation []  - 0 Support Surface(s) Assessment (bed, cushion, seat, etc.) INTERVENTIONS - Wound Cleansing / Measurement X - Simple Wound Cleansing - one wound  1 5 []  - 0 Complex Wound Cleansing - multiple wounds X- 1 5 Wound Imaging (photographs - any number of wounds) []  - 0 Wound Tracing (instead of photographs) X- 1 5 Simple Wound Measurement - one wound []  - 0 Complex Wound Measurement - multiple wounds INTERVENTIONS - Wound Dressings []  - 0 Small Wound Dressing one or multiple wounds X- 1 15 Medium Wound Dressing one or multiple wounds []  - 0 Large Wound Dressing one or multiple wounds X- 1 5 Application of Medications - topical []  - 0 Application of Medications - injection INTERVENTIONS - Miscellaneous []  - 0 External ear exam X- 1 5 Specimen Collection (cultures, biopsies, blood, body fluids, etc.) X- 1 5 Specimen(s) / Culture(s) sent or taken to Lab for analysis []  - 0 Patient Transfer (multiple staff / Civil Service fast streamer / Similar devices) []  - 0 Simple Staple / Suture removal (25 or less) []  - 0 Complex Staple / Suture removal (26 or more) []  - 0 Hypo / Hyperglycemic Management (close monitor of Blood Glucose) []  - 0 Ankle / Brachial Index (ABI) - do not check if billed separately X- 1 5 Vital Signs Has the patient been seen at the hospital within the last three years: Yes Total Score: 115 Level Of Care: New/Established - Level 3 Electronic Signature(s) Signed: 03/28/2020 6:12:47 PM By: Martin Hurst RN, BSN Entered By: Martin [name] on 03/27/2020 11:53:37 -------------------------------------------------------------------------------- Encounter Discharge Information Details Patient Name: Date of Service: Martin [name]. 03/27/2020 11:00 A M Medical Record Number: [id number] Patient Account Number: [id number] Date of Birth/Sex: Treating RN: 10/08/1945 (74 y.o. Martin [name] Primary Care Martin [name]:  Martin [name] Other Clinician: Referring Martin [name]: Treating Martin [name]/Extender: Martin [name] in Treatment: 13 Encounter Discharge Information Items Discharge Condition: Stable Ambulatory Status: Cane Discharge Destination: Home Transportation: Private Auto Accompanied By: self Schedule Follow-up Appointment: Yes Clinical Summary of Care: Patient Declined Electronic Signature(s) Signed: 03/27/2020 3:33:47 PM By: Martin [name] Entered By: Martin [name] on 03/27/2020 11:41:20 -------------------------------------------------------------------------------- Lower Extremity Assessment Details Patient Name: Date of Service: Martin [name], Martin [name]. 03/27/2020 11:00 A M Medical Record Number: [id number] Patient Account Number: [id number] Date of Birth/Sex: Treating RN: [birth year] (74 y.o. Martin [name] Primary Care Nayara Taplin: Martin [name] Other Clinician: Referring Jacquan Savas: Treating Lyndia Bury/Extender: Martin [name] in Treatment: 13 Edema Assessment Assessed: [name]: No] [name]: No] Edema: [Left: Ye] [Right: s] Calf Left: Right: Point of Measurement: From Medial Instep 43 cm Ankle Left: Right: Point of Measurement: From Medial Instep 27 cm Vascular Assessment Pulses: Dorsalis Pedis Palpable: [Left:Yes] Electronic Signature(s) Signed: 03/27/2020 3:33:47 PM By: Martin [name] Signed: 03/28/2020 6:12:47 PM By: Martin Hurst RN, BSN Entered By: Martin [name] on 03/27/2020 11:18:29 -------------------------------------------------------------------------------- Multi Wound Chart Details Patient Name: Date of Service: Martin [name]. 03/27/2020 11:00 A M Medical Record Number: [id number] Patient Account Number: [id number] Date of Birth/Sex: Treating RN: [birth year] (74 y.o. Martin [name] Primary Care Elaiza Shoberg: Martin [name] Other Clinician: Referring Johnita Palleschi: Treating Remee Charley/Extender: Martin [name] in Treatment: 13 Photos: [5:No Photos Left Metatarsal head fifth] [N/A:N/A N/A] Wound Location: [5:Gradually Appeared] [N/A:N/A] Wounding Event: [5:Diabetic Wound/Ulcer of the Lower] [N/A:N/A] Primary Etiology: [5:Extremity Cataracts, Coronary Artery Disease, N/A] Comorbid History: [5:Hypertension, Type II Diabetes, Neuropathy 10/31/2019] [N/A:N/A] Date Acquired: [5:13] [N/A:N/A] Weeks of Treatment: [5:Open] [N/A:N/A] Wound Status: [5:1.3x0.8x0.5] [N/A:N/A] Measurements L x W x D (cm) [5:0.817] [N/A:N/A] A (cm) : rea [5:0.408] [N/A:N/A] Volume (cm) : [5:32.50%] [N/A:N/A] % Reduction in A rea: [5:51.80%] [N/A:N/A] %  Reduction in Volume: [5:8] Starting Position 1 (o'clock): [5:11] Ending Position 1 (o'clock): [5:1.1] Maximum Distance 1 (cm): [5:Yes] [N/A:N/A] Undermining: [5:Grade 3] [N/A:N/A] Classification: [5:Medium] [N/A:N/A] Exudate A mount: [5:Serosanguineous] [N/A:N/A] Exudate Type: [5:red, brown] [N/A:N/A] Exudate Color: [5:Epibole] [N/A:N/A] Wound Margin: [5:Large (67-100%)] [N/A:N/A] Granulation A mount: [5:Pink, Pale] [N/A:N/A] Granulation Quality: [5:None Present (0%)] [N/A:N/A] Necrotic A mount: [5:Fat Layer (Subcutaneous Tissue): Yes N/A] Exposed Structures: [5:Fascia: No Tendon: No Muscle: No Joint: No Bone: No Medium (34-66%)] [N/A:N/A] Treatment Notes Electronic Signature(s) Signed: 03/27/2020 4:47:57 PM By: Linton Ham MD Signed: 03/28/2020 6:12:47 PM By: Martin Hurst RN, BSN Entered By: Linton Ham on 03/27/2020 11:23:10 -------------------------------------------------------------------------------- Multi-Disciplinary Care Plan Details Patient Name: Date of Service: Martin [name]. 03/27/2020 11:00 A M Medical Record Number: [id number] Patient Account Number: [id number] Date of Birth/Sex: Treating RN: 04-04-1946 (74 y.o. Martin [name] Primary Care Monetta Lick: Martin [name] Other Clinician: Referring  Noell Lorensen: Treating Karne Ozga/Extender: Martin [name] in Treatment: 13 Active Inactive Nutrition Nursing Diagnoses: Impaired glucose control: actual or potential Potential for alteratiion in Nutrition/Potential for imbalanced nutrition Goals: Patient/caregiver will maintain therapeutic glucose control Date Initiated: 12/22/2019 Target Resolution Date: 04/19/2020 Goal Status: Active Interventions: Assess HgA1c results as ordered upon admission and as needed Treatment Activities: Patient referred to Primary Care Physician for further nutritional evaluation : 12/22/2019 Notes: Wound/Skin Impairment Nursing Diagnoses: Impaired tissue integrity Knowledge deficit related to ulceration/compromised skin integrity Goals: Patient/caregiver will verbalize understanding of skin care regimen Date Initiated: 12/22/2019 Target Resolution Date: 04/12/2020 Goal Status: Active Ulcer/skin breakdown will have a volume reduction of 30% by week 4 Date Initiated: 12/22/2019 Date Inactivated: 01/19/2020 Target Resolution Date: 01/19/2020 Goal Status: Unmet Unmet Reason: infection Ulcer/skin breakdown will have a volume reduction of 50% by week 8 Date Initiated: 01/19/2020 Date Inactivated: 02/16/2020 Target Resolution Date: 02/16/2020 Goal Status: Unmet Unmet Reason: infection Ulcer/skin breakdown will have a volume reduction of 80% by week 12 Date Initiated: 02/16/2020 Date Inactivated: 03/15/2020 Target Resolution Date: 03/15/2020 Goal Status: Unmet Unmet Reason: osteo, getting HBO Interventions: Assess patient/caregiver ability to obtain necessary supplies Assess patient/caregiver ability to perform ulcer/skin care regimen upon admission and as needed Assess ulceration(s) every visit Provide education on ulcer and skin care Treatment Activities: Skin care regimen initiated : 12/22/2019 Topical wound management initiated : 12/22/2019 Notes: Electronic  Signature(s) Signed: 03/28/2020 6:12:47 PM By: Martin Hurst RN, BSN Entered By: Martin [name] on 03/27/2020 11:52:33 -------------------------------------------------------------------------------- Pain Assessment Details Patient Name: Date of Service: Martin [name]. 03/27/2020 11:00 A M Medical Record Number: [id number] Patient Account Number: [id number] Date of Birth/Sex: Treating RN: 1946-02-13 (74 y.o. Martin [name] Primary Care Ostin Mathey: Martin [name] Other Clinician: Referring Mavrick Mcquigg: Treating Sybil Shrader/Extender: Martin [name] in Treatment: 13 Active Problems Location of Pain Severity and Description of Pain Patient Has Paino Yes Site Locations Rate the pain. Current Pain Level: 4 Character of Pain Describe the Pain: Throbbing Pain Management and Medication Current Pain Management: Medication: Yes Cold Application: No Rest: No Massage: No Activity: No T.E.N.S.: No Heat Application: No Leg drop or elevation: No Is the Current Pain Management Adequate: Adequate How does your wound impact your activities of daily livingo Sleep: No Bathing: No Appetite: No Relationship With Others: No Bladder Continence: No Emotions: No Bowel Continence: No Work: No Toileting: No Drive: No Dressing: No Hobbies: No Electronic Signature(s) Signed: 03/27/2020 3:33:47 PM By: Martin [name] Signed: 03/28/2020 6:12:47 PM By: Martin Hurst RN, BSN Entered By: Martin [name] on 03/27/2020 11:18:24 -------------------------------------------------------------------------------- Patient/Caregiver Education Details  Patient Name: Date of Service: Martin [name], Martin [name] 11/29/2021andnbsp11:00 A M Medical Record Number: [id number] Patient Account Number: [id number] Date of Birth/Gender: Treating RN: 02-06-1946 (74 y.o. Martin [name] Primary Care Physician: Martin [name] Other Clinician: Referring Physician: Treating Physician/Extender:  Martin [name] in Treatment: 13 Education Assessment Education Provided To: Patient Education Topics Provided Wound/Skin Impairment: Methods: Explain/Verbal Responses: State content correctly [employer]) Signed: 03/28/2020 6:12:47 PM By: Martin Hurst RN, BSN Entered By: Martin [name] on 03/27/2020 11:52:46 -------------------------------------------------------------------------------- Wound Assessment Details Patient Name: Date of Service: Martin [name]. 03/27/2020 11:00 A M Medical Record Number: [id number] Patient Account Number: [id number] Date of Birth/Sex: Treating RN: February 03, 1946 (74 y.o. Martin [name] Primary Care Travious Vanover: Martin [name] Other Clinician: Referring Keelin Sheridan: Treating Lamere Lightner/Extender: Martin [name] in Treatment: 13 Wound Status Wound Number: 5 Primary Diabetic Wound/Ulcer of the Lower Extremity Etiology: Wound Location: Left Metatarsal head fifth Wound Open Wounding Event: Gradually Appeared Status: Date Acquired: 10/31/2019 Comorbid Cataracts, Coronary Artery Disease, Hypertension, Type II Weeks Of Treatment: 13 History: Diabetes, Neuropathy Clustered Wound: No Photos Photo Uploaded By: Martin [name] on 03/28/2020 14:43:04 Wound Measurements Length: (cm) 1.3 Width: (cm) 0.8 Depth: (cm) 0.5 Area: (cm) 0.817 Volume: (cm) 0.408 % Reduction in Area: 32.5% % Reduction in Volume: 51.8% Epithelialization: Medium (34-66%) Tunneling: No Undermining: Yes Starting Position (o'clock): 8 Ending Position (o'clock): 11 Maximum Distance: (cm) 1.1 Wound Description Classification: Grade 3 Wound Margin: Epibole Exudate Amount: Medium Exudate Type: Serosanguineous Exudate Color: red, brown Foul Odor After Cleansing: No Slough/Fibrino No Wound Bed Granulation Amount: Large (67-100%) Exposed Structure Granulation Quality: Pink, Pale Fascia Exposed: No Necrotic Amount: None  Present (0%) Fat Layer (Subcutaneous Tissue) Exposed: Yes Tendon Exposed: No Muscle Exposed: No Joint Exposed: No Bone Exposed: No Treatment Notes Wound #5 (Left Metatarsal head fifth) 1. Cleanse With Wound Cleanser 3. Primary Dressing Applied Calcium Alginate Ag 4. Secondary Dressing Dry Gauze Roll Gauze 5. Secured With Tape 7. Footwear/Offloading device applied Surgical shoe Notes netting Electronic Signature(s) Signed: 03/27/2020 3:33:47 PM By: Martin [name] Signed: 03/28/2020 6:12:47 PM By: Martin Hurst RN, BSN Entered By: Martin [name] on 03/27/2020 11:19:24 -------------------------------------------------------------------------------- Vitals Details Patient Name: Date of Service: Martin [name]. 03/27/2020 11:00 A M Medical Record Number: [id number] Patient Account Number: [id number] Date of Birth/Sex: Treating RN: Dec 29, 1945 (74 y.o. Martin [name] Primary Care Teal Bontrager: Martin [name] Other Clinician: Referring Glendoris Nodarse: Treating Maggy Wyble/Extender: Martin [name] in Treatment: 13 Vital Signs Time Taken: 11:17 Temperature (F): 98.2 Height (in): 72 Pulse (bpm): 74 Weight (lbs): 270 Respiratory Rate (breaths/min): 18 Body Mass Index (BMI): 36.6 Blood Pressure (mmHg): 161/71 Capillary Blood Glucose (mg/dl): 191 Reference Range: 80 - 120 mg / dl Electronic Signature(s) Signed: 03/27/2020 3:33:47 PM By: Martin [name] Entered By: Martin [name] on 03/27/2020 11:41:53

## 2020-03-28 NOTE — Progress Notes (Signed)
DONOVAN, PERSLEY ([id number]) Visit Report for 03/28/2020 SuperBill Details Patient Name: Date of Service: Martin [name], Martin [name] 03/28/2020 Medical Record Number: [id number] Patient Account Number: [id number] Date of Birth/Sex: Treating RN: 13-Mar-1946 (74 y.o. 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

## 2020-03-28 NOTE — Progress Notes (Signed)
Martin [name], Martin [name] ([id number]) Visit Report for 03/28/2020 Arrival Information Details Patient Name: Date of Service: Martin [name], Martin [name] 03/28/2020 8:00 A M Medical Record Number: [id number] Patient Account Number: [id number] Date of Birth/Sex: Treating RN: Apr 18, 1946 (74 y.o. [name]) Carlene Coria Primary Care Kimberlin Scheel: [name] Other Clinician: [name] Referring Jonh Mcqueary: Treating Lyndee Herbst/Extender: [name] in Treatment: 90 Visit Information History Since Last Visit Added or deleted any medications: No Patient Arrived: [name] Any new allergies or adverse reactions: No Arrival Time: 08:05 Had a fall or experienced change in No Accompanied By: self activities of daily living that may affect Transfer Assistance: None risk of falls: Patient Identification Verified: Yes Signs or symptoms of abuse/neglect since last visito No Secondary Verification Process Completed: Yes Hospitalized since last visit: No Patient Requires Transmission-Based Precautions: No Implantable device outside of the clinic excluding No Patient Has Alerts: No cellular tissue based products placed in the center since last visit: Pain Present Now: No Electronic Signature(s) Signed: 03/28/2020 3:17:42 PM By: [name] EMT/HBOT/SD Entered By: [name] on 03/28/2020 08:31:35 -------------------------------------------------------------------------------- Encounter Discharge Information Details Patient Name: Date of Service: Martin Agee. 03/28/2020 8:00 A M Medical Record Number: [id number] Patient Account Number: [id number] Date of Birth/Sex: Treating RN: 09-Oct-1945 (74 y.o. [name]) Carlene Coria Primary Care Carolyn Sylvia: [name] Other Clinician: [name] Referring Shaymus Eveleth: Treating Austine Kelsay/Extender: [name] in Treatment: 13 Encounter Discharge Information Items Discharge Condition: Stable Ambulatory Status: Cane Discharge  Destination: Home Transportation: Private Auto Accompanied By: self Schedule Follow-up Appointment: Yes Clinical Summary of Care: Patient Declined Electronic Signature(s) Signed: 03/28/2020 3:17:42 PM By: [name] EMT/HBOT/SD Entered By: [name] on 03/28/2020 10:53:30 -------------------------------------------------------------------------------- Patient/Caregiver Education Details Patient Name: Date of Service: Martin Agee 11/30/2021andnbsp8:00 A M Medical Record Number: [id number] Patient Account Number: [id number] Date of Birth/Gender: Treating RN: 1946/04/20 (74 y.o. [name]) Carlene Coria Primary Care Physician: [name] Other Clinician: [name] Referring Physician: Treating Physician/Extender: [name] in Treatment: 13 Education Assessment Education Provided To: Patient Education Topics Provided Hyperbaric Oxygenation: Methods: Explain/Verbal Responses: State content correctly Electronic Signature(s) Signed: 03/28/2020 3:17:42 PM By: [name] EMT/HBOT/SD Entered By: [name] on 03/28/2020 10:53:18 -------------------------------------------------------------------------------- Vitals Details Patient Name: Date of Service: Martin Agee. 03/28/2020 8:00 A M Medical Record Number: [id number] Patient Account Number: [id number] Date of Birth/Sex: Treating RN: Aug 25, 1945 (74 y.o. [name]) Carlene Coria Primary Care Nijae Doyel: [name] Other Clinician: [name] Referring Dilynn Munroe: Treating Ramia Sidney/Extender: [name] in Treatment: 13 Vital Signs Time Taken: 08:10 Temperature (F): 97.9 Height (in): 72 Pulse (bpm): 94 Weight (lbs): 270 Respiratory Rate (breaths/min): 17 Body Mass Index (BMI): 36.6 Blood Pressure (mmHg): 167/84 Capillary Blood Glucose (mg/dl): 189 Reference Range: 80 - 120 mg / dl Electronic Signature(s) Signed: 03/28/2020 3:17:42 PM By: [name] EMT/HBOT/SD Entered By: [name] on 03/28/2020 08:31:49

## 2020-03-28 NOTE — Progress Notes (Signed)
Martin [name] ([id number]) Visit Report for 03/28/2020 Arrival Information Details Patient Name: Date of Service: Martin [name] 03/28/2020 7:30 A M Medical Record Number: [id number] Patient Account Number: [id number] Date of Birth/Sex: Treating RN: 08-01-1945 (74 y.o. Martin [name], Martin [name] Primary Care Eldin Bonsell: Martin [name] Other Clinician: Referring Rodnisha Blomgren: Treating Lilee Aldea/Extender: Martin [name] in Treatment: 41 Visit Information History Since Last Visit Added or deleted any medications: No Patient Arrived: [name] Any new allergies or adverse reactions: No Arrival Time: 07:50 Had a fall or experienced change in No Accompanied By: self activities of daily living that may affect Transfer Assistance: None risk of falls: Patient Identification Verified: Yes Signs or symptoms of abuse/neglect since last visito No Secondary Verification Process Completed: Yes Hospitalized since last visit: No Patient Requires Transmission-Based Precautions: No Implantable device outside of the clinic excluding No Patient Has Alerts: No cellular tissue based products placed in the center since last visit: Has Dressing in Place as Prescribed: Yes Pain Present Now: Yes Electronic Signature(s) Signed: 03/28/2020 5:52:12 PM By: Rhae Hammock RN Entered By: Rhae Hammock on 03/28/2020 07:54:31 -------------------------------------------------------------------------------- Clinic Level of Care Assessment Details Patient Name: Date of Service: Martin [name] 03/28/2020 7:30 A M Medical Record Number: [id number] Patient Account Number: [id number] Date of Birth/Sex: Treating RN: [birth year] (74 y.o. Martin [name], Martin [name] Primary Care Harshaan Whang: Martin [name] Other Clinician: Referring Ramez Arrona: Treating Elnoria Livingston/Extender: Martin [name] in Treatment: 13 Clinic Level of Care Assessment Items TOOL 4 Quantity Score X- 1 0 Use when only  an EandM is performed on FOLLOW-UP visit ASSESSMENTS - Nursing Assessment / Reassessment X- 1 10 Reassessment of Co-morbidities (includes updates in patient status) X- 1 5 Reassessment of Adherence to Treatment Plan ASSESSMENTS - Wound and Skin A ssessment / Reassessment X - Simple Wound Assessment / Reassessment - one wound 1 5 []  - 0 Complex Wound Assessment / Reassessment - multiple wounds X- 1 10 Dermatologic / Skin Assessment (not related to wound area) ASSESSMENTS - Focused Assessment []  - 0 Circumferential Edema Measurements - multi extremities X- 1 10 Nutritional Assessment / Counseling / Intervention X- 1 5 Lower Extremity Assessment (monofilament, tuning fork, pulses) []  - 0 Peripheral Arterial Disease Assessment (using hand held doppler) ASSESSMENTS - Ostomy and/or Continence Assessment and Care []  - 0 Incontinence Assessment and Management []  - 0 Ostomy Care Assessment and Management (repouching, etc.) PROCESS - Coordination of Care X - Simple Patient / Family Education for ongoing care 1 15 []  - 0 Complex (extensive) Patient / Family Education for ongoing care []  - 0 Staff obtains Programmer, systems, Records, T Results / Process Orders est []  - 0 Staff telephones HHA, Nursing Homes / Clarify orders / etc []  - 0 Routine Transfer to another Facility (non-emergent condition) []  - 0 Routine Hospital Admission (non-emergent condition) []  - 0 New Admissions / Biomedical engineer / Ordering NPWT Apligraf, etc. , []  - 0 Emergency Hospital Admission (emergent condition) X- 1 10 Simple Discharge Coordination []  - 0 Complex (extensive) Discharge Coordination PROCESS - Special Needs []  - 0 Pediatric / Minor Patient Management []  - 0 Isolation Patient Management []  - 0 Hearing / Language / Visual special needs []  - 0 Assessment of Community assistance (transportation, D/C planning, etc.) []  - 0 Additional assistance / Altered mentation []  - 0 Support Surface(s)  Assessment (bed, cushion, seat, etc.) INTERVENTIONS - Wound Cleansing / Measurement X - Simple Wound Cleansing - one wound 1 5 []  - 0 Complex Wound Cleansing - multiple wounds []  -  0 Wound Imaging (photographs - any number of wounds) []  - 0 Wound Tracing (instead of photographs) X- 1 5 Simple Wound Measurement - one wound []  - 0 Complex Wound Measurement - multiple wounds INTERVENTIONS - Wound Dressings X - Small Wound Dressing one or multiple wounds 1 10 []  - 0 Medium Wound Dressing one or multiple wounds []  - 0 Large Wound Dressing one or multiple wounds []  - 0 Application of Medications - topical []  - 0 Application of Medications - injection INTERVENTIONS - Miscellaneous []  - 0 External ear exam []  - 0 Specimen Collection (cultures, biopsies, blood, body fluids, etc.) []  - 0 Specimen(s) / Culture(s) sent or taken to Lab for analysis []  - 0 Patient Transfer (multiple staff / Civil Service fast streamer / Similar devices) []  - 0 Simple Staple / Suture removal (25 or less) []  - 0 Complex Staple / Suture removal (26 or more) []  - 0 Hypo / Hyperglycemic Management (close monitor of Blood Glucose) []  - 0 Ankle / Brachial Index (ABI) - do not check if billed separately X- 1 5 Vital Signs Has the patient been seen at the hospital within the last three years: Yes Total Score: 95 Level Of Care: New/Established - Level 3 Electronic Signature(s) Signed: 03/28/2020 5:52:12 PM By: Rhae Hammock RN Entered By: Rhae Hammock on 03/28/2020 08:12:18 -------------------------------------------------------------------------------- Encounter Discharge Information Details Patient Name: Date of Service: Martin Agee. 03/28/2020 7:30 A M Medical Record Number: [id number] Patient Account Number: [id number] Date of Birth/Sex: Treating RN: 10-16-1945 (74 y.o. Martin [name], Martin [name] Primary Care Nygel Prokop: Martin [name] Other Clinician: Referring Winefred Hillesheim: Treating Jeffifer Rabold/Extender: Martin [name] in Treatment: 13 Encounter Discharge Information Items Discharge Condition: Stable Ambulatory Status: Cane Discharge Destination: Home Transportation: Private Auto Accompanied By: self Schedule Follow-up Appointment: Yes Clinical Summary of Care: Patient Declined Electronic Signature(s) Signed: 03/28/2020 5:52:12 PM By: Rhae Hammock RN Entered By: Rhae Hammock on 03/28/2020 08:11:38 -------------------------------------------------------------------------------- Patient/Caregiver Education Details Patient Name: Date of Service: Martin Agee 11/30/2021andnbsp7:30 A M Medical Record Number: [id number] Patient Account Number: [id number] Date of Birth/Gender: Treating RN: 03/07/1946 (74 y.o. Erie Noe Primary Care Physician: Martin [name] Other Clinician: Referring Physician: Treating Physician/Extender: Martin [name] in Treatment: 13 Education Assessment Education Provided To: Patient Education Topics Provided Wound/Skin Impairment: Methods: Explain/Verbal Responses: Reinforcements needed Electronic Signature(s) Signed: 03/28/2020 5:52:12 PM By: Rhae Hammock RN Entered By: Rhae Hammock on 03/28/2020 08:11:26 -------------------------------------------------------------------------------- Wound Assessment Details Patient Name: Date of Service: Martin Agee. 03/28/2020 7:30 A M Medical Record Number: [id number] Patient Account Number: [id number] Date of Birth/Sex: Treating RN: Sep 19, 1945 (74 y.o. Martin [name], Martin [name] Primary Care Jazleen Robeck: Martin [name] Other Clinician: Referring Darene Nappi: Treating Jatara Huettner/Extender: Martin [name] in Treatment: 13 Wound Status Wound Number: 5 Primary Etiology: Diabetic Wound/Ulcer of the Lower Extremity Wound Location: Left Metatarsal head fifth Wound Status: Open Wounding Event: Gradually Appeared Date Acquired:  10/31/2019 Weeks Of Treatment: 13 Clustered Wound: No Wound Measurements Length: (cm) 1.3 Width: (cm) 0.8 Depth: (cm) 0.5 Area: (cm) 0.817 Volume: (cm) 0.408 % Reduction in Area: 32.5% % Reduction in Volume: 51.8% Wound Description Classification: Grade 3 Treatment Notes Wound #5 (Left Metatarsal head fifth) 1. Cleanse With Wound Cleanser 3. Primary Dressing Applied Calcium Alginate Ag 4. Secondary Dressing Dry Gauze Roll Gauze Foam 5. Secured With Tape 7. Footwear/Offloading device applied Surgical shoe Notes netting Electronic Signature(s) Signed: 03/28/2020 5:52:12 PM By: Rhae Hammock RN Entered By: Rhae Hammock on 03/28/2020 07:55:12 -------------------------------------------------------------------------------- Vitals Details Patient Name:  Date of Service: WILBORN, MEMBRENO 03/28/2020 7:30 A M Medical Record Number: [id number] Patient Account Number: [id number] Date of Birth/Sex: Treating RN: Sep 05, 1945 (74 y.o. Martin [name], Martin [name] Primary Care Raisa Ditto: Other Clinician: Garret [name] Referring Hy Swiatek: Treating Hailyn Zarr/Extender: Martin [name] in Treatment: 13 Vital Signs Time Taken: 07:54 Temperature (F): 97.9 Height (in): 72 Pulse (bpm): 94 Weight (lbs): 270 Respiratory Rate (breaths/min): 17 Body Mass Index (BMI): 36.6 Blood Pressure (mmHg): 167/84 Capillary Blood Glucose (mg/dl): 125 Reference Range: 80 - 120 mg / dl Electronic Signature(s) Signed: 03/28/2020 5:52:12 PM By: Rhae Hammock RN Entered By: Rhae Hammock on 03/28/2020 07:55:01

## 2020-03-28 NOTE — Progress Notes (Signed)
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 starting at 8:00 and ending at 11:00 with a maximum distance of 1.1cm. There is a medium amount of serosanguineous drainage noted. The wound margin is epibole. There is large (67-100%) pink, pale granulation within the wound bed. There is no necrotic tissue within the wound bed. 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 Skin Barriers/Peri-Wound Care: Antifungal powder - nystatin powder to groins 2 times per day as needed for rash Wound Cleansing: Wound #5 Left Metatarsal head fifth: May shower with protection. Primary Wound Dressing: Wound #5 Left Metatarsal head fifth: Calcium Alginate with Silver - pack into wound Secondary Dressing: Wound #5 Left Metatarsal head fifth: Foam - donut Kerlix/Rolled Gauze Dry Gauze Off-Loading: Open toe surgical shoe to: - left foot Additional Orders / Instructions: Follow Nutritious Diet Other: - take one probiotic capsule daily Hyperbaric Oxygen Therapy: Evaluate for HBO Therapy Indication: - wagner grade 3 diabetic foot ulcer left 5th met head If appropriate for treatment, begin HBOT per protocol:  2.0 ATA for 90 Minutes without Air Breaks T Number of Treatments: - 40 otal One treatments per day (delivered Monday through Friday unless otherwise specified in Special Instructions below): Finger stick Blood Glucose Pre- and Post- HBOT Treatment. Follow Hyperbaric Oxygen Glycemia Protocol Afrin (Oxymetazoline HCL) 0.05% nasal spray - 1 spray in both nostrils daily  as needed prior to HBO treatment for difficulty clearing ears Laboratory ordered were: Anerobic culture - Left 5th metatarsal Radiology ordered were: X-ray, left foot, complete view - Non healing ulcer on left 5th metatarsal 1. Diabetic foot infection likely cellulitis. 2. I did a culture of the medial tunneling area for CandS 3. Plain x-ray of the left foot 4. I am going to give him empiric antibiotics while await the culture and the x-ray. No total contact cast reapplication however I have not interrupted the hyperbarics. We will do a nurse check on him tomorrow and he comes back to for a provider visit on Wednesday 5. 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 Trimethoprim sulfamethoxazole pending the culture Electronic Signature(s) Signed: 03/27/2020 4:47:57 PM By: Linton Ham MD Entered By: Linton Ham on 03/27/2020 11:30:27 -------------------------------------------------------------------------------- SuperBill Details Patient Name: Date of Service: [name] 03/27/2020 Medical Record Number: [id number] Patient Account Number: [id number] Date of Birth/Sex: Treating RN: 11/22/1945 (74 y.o. [name] Primary Care Provider: [name] Other Clinician: Referring Provider: Treating Provider/Extender: [name] in Treatment: 13 Diagnosis Coding ICD-10 Codes Code Description E11.621 Type 2 diabetes mellitus with foot ulcer L97.522 Non-pressure chronic ulcer of other part of left foot with fat layer exposed I10 Essential (primary) hypertension I25.10 Atherosclerotic heart disease of native coronary artery without angina pectoris M86.272 Subacute osteomyelitis, left ankle and foot Facility Procedures CPT4 Code: [id number] Description: [place] VISIT-LEV 3 EST PT Modifier: Quantity: 1 Electronic Signature(s) Signed: 03/27/2020 4:47:57 PM By: Linton Ham  MD Signed: 03/28/2020 6:12:47 PM By: [name] RN, BSN Entered By: [name] on 03/27/2020 11:53:51

## 2020-03-28 NOTE — Progress Notes (Addendum)
MARSHEL, GOLUBSKI ([id number]) Visit Report for 03/28/2020 HBO Details Patient Name: Date of Service: Martin [name], Martin [name] 03/28/2020 8:00 A M Medical Record Number: [id number] Patient Account Number: [id number] Date of Birth/Sex: Treating RN: Nov 11, 1945 (74 y.o. Martin [name]) Carlene Coria Primary Care Edgar Reisz: [name] Other Clinician: [name] Referring Daelynn Blower: Treating Janda Cargo/Extender: [name] in Treatment: 13 HBO Treatment Course Details Treatment Course Number: 1 Ordering Valerie Cones: Worthy Keeler T Treatments Ordered: otal 40 HBO Treatment Start Date: 02/22/2020 HBO Indication: Diabetic Ulcer(s) of the Lower Extremity HBO Treatment Details Treatment Number: 20 Patient Type: Outpatient Chamber Type: Monoplace Chamber Serial #: [id number] Treatment Protocol: 2.0 ATA with 90 minutes oxygen, and no air breaks Treatment Details Compression Rate Down: 2.0 psi / minute De-Compression Rate Up: 2.0 psi / minute Air breaks and breathing Decompress Decompress Compress Tx Pressure Begins Reached periods Begins Ends (leave unused spaces blank) Chamber Pressure (ATA 1 2 ------2 1 ) Clock Time (24 hr) 08:27 08:35 - - - - - - 10:05 10:13 Treatment Length: 106 (minutes) Treatment Segments: 4 Vital Signs Capillary Blood Glucose Reference Range: 80 - 120 mg / dl HBO Diabetic Blood Glucose Intervention Range: <131 mg/dl or >249 mg/dl Time Vitals Blood Respiratory Capillary Blood Glucose Pulse Action Type: Pulse: Temperature: Taken: Pressure: Rate: Glucose (mg/dl): Meter #: Oximetry (%) Taken: Pre 08:10 167/84 94 17 97.9 189 Post 10:15 167/81 81 16 98 163 Treatment Response Treatment Toleration: Well Treatment Completion Status: Treatment Completed without Adverse Event Additional Procedure Documentation Tissue Sevierity: Necrosis of bone Yuliza Cara Notes No concerns with treatment given. Our nurses looked at the wound area apparently looked a  lot better than yesterday and his pain was down. This is almost too fast for this to be an infection I wonder if this was a cast issue Physician HBO Attestation: I certify that I supervised this HBO treatment in accordance with Medicare guidelines. A trained emergency response team is readily available per Yes hospital policies and procedures. Continue HBOT as ordered. Yes Electronic Signature(s) Signed: 03/28/2020 5:54:56 PM By: Linton Ham MD Signed: 03/28/2020 5:54:56 PM By: Linton Ham MD Previous Signature: 03/28/2020 3:17:42 PM Version By: [name] EMT/HBOT/SD Entered By: Linton Ham on 03/28/2020 17:52:53 -------------------------------------------------------------------------------- HBO Safety Checklist Details Patient Name: Date of Service: Martin [name]. 03/28/2020 8:00 A M Medical Record Number: [id number] Patient Account Number: [id number] Date of Birth/Sex: Treating RN: 08/20/1945 (74 y.o. Martin [name]) Carlene Coria Primary Care Monty Mccarrell: [name] Other Clinician: [name] Referring Kashvi Prevette: Treating Kimbery Harwood/Extender: [name] in Treatment: 13 HBO Safety Checklist Items Safety Checklist Consent Form Signed Patient voided / foley secured and emptied When did you last eato 0700 - yogurt / apple Last dose of injectable or oral agent insulin Ostomy pouch emptied and vented if applicable NA All implantable devices assessed, documented and approved NA Intravenous access site secured and place NA Valuables secured Linens and cotton and cotton/polyester blend (less than 51% polyester) Personal oil-based products / skin lotions / body lotions removed Wigs or hairpieces removed NA Smoking or tobacco materials removed NA Books / newspapers / magazines / loose paper removed Cologne, aftershave, perfume and deodorant removed Jewelry removed (may wrap wedding band) Make-up removed NA Hair care products  removed Battery operated devices (external) removed NA Heating patches and chemical warmers removed NA Titanium eyewear removed NA Nail polish cured greater than 10 hours NA Casting material cured greater than 10 hours NA Hearing aids removed NA Loose dentures or partials removed NA  Prosthetics have been removed NA Patient demonstrates correct use of air break device (if applicable) Patient concerns have been addressed Patient grounding bracelet on and cord attached to chamber Specifics for Inpatients (complete in addition to above) Medication sheet sent with patient Intravenous medications needed or due during therapy sent with patient Drainage tubes (e.g. nasogastric tube or chest tube secured and vented) Endotracheal or Tracheotomy tube secured Cuff deflated of air and inflated with saline Airway suctioned Electronic Signature(s) Signed: 03/28/2020 8:32:45 AM By: [name] EMT/HBOT/SD Entered By: [name] on 03/28/2020 08:32:45

## 2020-03-29 ENCOUNTER — Encounter (HOSPITAL_BASED_OUTPATIENT_CLINIC_OR_DEPARTMENT_OTHER): Payer: PPO | Attending: Physician Assistant | Admitting: Physician Assistant

## 2020-03-29 ENCOUNTER — Other Ambulatory Visit: Payer: Self-pay

## 2020-03-29 ENCOUNTER — Encounter (HOSPITAL_BASED_OUTPATIENT_CLINIC_OR_DEPARTMENT_OTHER): Payer: PPO | Admitting: Physician Assistant

## 2020-03-29 DIAGNOSIS — I2510 Atherosclerotic heart disease of native coronary artery without angina pectoris: Secondary | ICD-10-CM | POA: Insufficient documentation

## 2020-03-29 DIAGNOSIS — E1169 Type 2 diabetes mellitus with other specified complication: Secondary | ICD-10-CM | POA: Diagnosis not present

## 2020-03-29 DIAGNOSIS — L97514 Non-pressure chronic ulcer of other part of right foot with necrosis of bone: Secondary | ICD-10-CM | POA: Diagnosis not present

## 2020-03-29 DIAGNOSIS — I10 Essential (primary) hypertension: Secondary | ICD-10-CM | POA: Diagnosis not present

## 2020-03-29 DIAGNOSIS — Z881 Allergy status to other antibiotic agents status: Secondary | ICD-10-CM | POA: Insufficient documentation

## 2020-03-29 DIAGNOSIS — Z794 Long term (current) use of insulin: Secondary | ICD-10-CM | POA: Diagnosis not present

## 2020-03-29 DIAGNOSIS — L03116 Cellulitis of left lower limb: Secondary | ICD-10-CM | POA: Insufficient documentation

## 2020-03-29 DIAGNOSIS — E11621 Type 2 diabetes mellitus with foot ulcer: Secondary | ICD-10-CM | POA: Insufficient documentation

## 2020-03-29 DIAGNOSIS — M86272 Subacute osteomyelitis, left ankle and foot: Secondary | ICD-10-CM | POA: Insufficient documentation

## 2020-03-29 DIAGNOSIS — Z87891 Personal history of nicotine dependence: Secondary | ICD-10-CM | POA: Diagnosis not present

## 2020-03-29 DIAGNOSIS — L97522 Non-pressure chronic ulcer of other part of left foot with fat layer exposed: Secondary | ICD-10-CM | POA: Diagnosis not present

## 2020-03-29 DIAGNOSIS — Z888 Allergy status to other drugs, medicaments and biological substances status: Secondary | ICD-10-CM | POA: Insufficient documentation

## 2020-03-29 LAB — GLUCOSE, CAPILLARY
Glucose-Capillary: 180 mg/dL — ABNORMAL HIGH (ref 70–99)
Glucose-Capillary: 171 mg/dL — ABNORMAL HIGH (ref 70–99)

## 2020-03-29 NOTE — Progress Notes (Signed)
LOTTIE, SISKA ([id number]) Visit Report for 03/29/2020 SuperBill Details Patient Name: Date of Service: ELIYAS, SUDDRETH 03/29/2020 Medical Record Number: [id number] Patient Account Number: [id number] Date of Birth/Sex: Treating RN: [birth year] (74 y.o. [name], [name] Primary Care Provider: [name] Other Clinician: [name] Referring Provider: Treating Provider/Extender: Sandria Bales in Treatment: 14 Diagnosis Coding ICD-10 Codes Code Description [phone] Type 2 diabetes mellitus with foot ulcer L97.522 Non-pressure chronic ulcer of other part of left foot with fat layer exposed I10 Essential (primary) hypertension I25.10 Atherosclerotic heart disease of native coronary artery without angina pectoris M86.272 Subacute osteomyelitis, left ankle and foot Facility Procedures CPT4 Code Description Modifier Quantity [id number]-(Facility Use Only) HBOT full body chamber, 63min , 4 Physician Procedures Quantity CPT4 Code Description Modifier [id number] - WC PHYS HYPERBARIC OXYGEN THERAPY 1 ICD-10 Diagnosis Description E11.621 Type 2 diabetes mellitus with foot ulcer Electronic Signature(s) Signed: 03/29/2020 4:20:36 PM By: [name] EMT/HBOT/SD Signed: 03/29/2020 4:34:22 PM By: Worthy Keeler PA-C Entered By: [name] on 03/29/2020 12:55:34

## 2020-03-29 NOTE — Progress Notes (Addendum)
Martin [name] ([id number]) Visit Report for 03/29/2020 HBO Details Patient Name: Date of Service: Martin [name], Martin [name] 03/29/2020 8:00 A M Medical Record Number: [id number] Patient Account Number: [id number] Date of Birth/Sex: Treating RN: 12-Jan-1946 (74 y.o. Martin [name] Primary Care Martin [name]: Martin [name] Other Clinician: [name] Referring Martin [name]: Treating Martin [name]/Extender: Martin [name] in Treatment: 14 HBO Treatment Course Details Treatment Course Number: 1 Ordering Kylea Berrong: Worthy Keeler T Treatments Ordered: otal 40 HBO Treatment Start Date: 02/22/2020 HBO Indication: Diabetic Ulcer(s) of the Lower Extremity HBO Treatment Details Treatment Number: 21 Patient Type: Outpatient Chamber Type: Monoplace Chamber Serial #: [id number] Treatment Protocol: 2.0 ATA with 90 minutes oxygen, and no air breaks Treatment Details Compression Rate Down: 2.0 psi / minute De-Compression Rate Up: 2.0 psi / minute Air breaks and breathing Decompress Decompress Compress Tx Pressure Begins Reached periods Begins Ends (leave unused spaces blank) Chamber Pressure (ATA 1 2 ------2 1 ) Clock Time (24 hr) 08:25 08:33 - - - - - - 10:03 10:11 Treatment Length: 106 (minutes) Treatment Segments: 4 Vital Signs Capillary Blood Glucose Reference Range: 80 - 120 mg / dl HBO Diabetic Blood Glucose Intervention Range: <131 mg/dl or >249 mg/dl Time Vitals Blood Respiratory Capillary Blood Glucose Pulse Action Type: Pulse: Temperature: Taken: Pressure: Rate: Glucose (mg/dl): Meter #: Oximetry (%) Taken: Pre 08:05 154/68 76 16 98 180 Post 10:13 156/76 71 18 98 171 Treatment Response Treatment Toleration: Well Treatment Completion Status: Treatment Completed without Adverse Event Additional Procedure Documentation Tissue Sevierity: Necrosis of bone Electronic Signature(s) Signed: 03/29/2020 4:20:36 PM By: [name] EMT/HBOT/SD Signed: 03/29/2020  4:34:22 PM By: Worthy Keeler PA-C Entered By: [name] on 03/29/2020 12:55:25 -------------------------------------------------------------------------------- HBO Safety Checklist Details Patient Name: Date of Service: Martin Agee. 03/29/2020 8:00 A M Medical Record Number: [id number] Patient Account Number: [id number] Date of Birth/Sex: Treating RN: 01-Aug-1945 (74 y.o. Martin [name] Primary Care [employer]: Martin [name] Other Clinician: [name] Referring Makinzie Considine: Treating [name]/Extender: Martin [name] in Treatment: 14 HBO Safety Checklist Items Safety Checklist Consent Form Signed Patient voided / foley secured and emptied When did you last eato 0700 - orange juice Last dose of injectable or oral agent insulin Ostomy pouch emptied and vented if applicable NA All implantable devices assessed, documented and approved NA Intravenous access site secured and place NA Valuables secured Linens and cotton and cotton/polyester blend (less than 51% polyester) Personal oil-based products / skin lotions / body lotions removed Wigs or hairpieces removed NA Smoking or tobacco materials removed NA Books / newspapers / magazines / loose paper removed Cologne, aftershave, perfume and deodorant removed Jewelry removed (may wrap wedding band) Make-up removed NA Hair care products removed Battery operated devices (external) removed NA Heating patches and chemical warmers removed NA Titanium eyewear removed NA Nail polish cured greater than 10 hours NA Casting material cured greater than 10 hours NA Hearing aids removed NA Loose dentures or partials removed NA Prosthetics have been removed NA Patient demonstrates correct use of air break device (if applicable) Patient concerns have been addressed Patient grounding bracelet on and cord attached to chamber Specifics for Inpatients (complete in addition to above) Medication  sheet sent with patient Intravenous medications needed or due during therapy sent with patient Drainage tubes (e.g. nasogastric tube or chest tube secured and vented) Endotracheal or Tracheotomy tube secured Cuff deflated of air and inflated with saline Airway suctioned Electronic Signature(s) Signed: 03/29/2020 8:16:59 AM By: [name] EMT/HBOT/SD  Entered ByMikeal Hawthorne on 03/29/2020 08:16:58

## 2020-03-29 NOTE — Progress Notes (Signed)
[name], [name] ([id number]) Visit Report for 03/29/2020 Arrival Information Details Patient Name: Date of Service: [name], [name] 03/29/2020 8:00 A M Medical Record Number: [id number] Patient Account Number: [id number] Date of Birth/Sex: Treating RN: 12-Jan-1946 (74 y.o. Martin [name], Martin [name] Primary Care Martin [name]: Martin [name] Other Clinician: [name] Referring Kalieb Freeland: Treating Genevieve Arbaugh/Extender: Sandria Bales in Treatment: 14 Visit Information History Since Last Visit Added or deleted any medications: No Patient Arrived: Martin [name] Any new allergies or adverse reactions: No Arrival Time: 08:00 Had a fall or experienced change in No Accompanied By: self activities of daily living that may affect Transfer Assistance: None risk of falls: Patient Identification Verified: Yes Signs or symptoms of abuse/neglect since last visito No Secondary Verification Process Completed: Yes Hospitalized since last visit: No Patient Requires Transmission-Based Precautions: No Implantable device outside of the clinic excluding No Patient Has Alerts: No cellular tissue based products placed in the center since last visit: Pain Present Now: No Electronic Signature(s) Signed: 03/29/2020 4:20:36 PM By: [name] EMT/HBOT/SD Entered By: [name] on 03/29/2020 08:15:59 -------------------------------------------------------------------------------- Encounter Discharge Information Details Patient Name: Date of Service: Martin Agee. 03/29/2020 8:00 A M Medical Record Number: [id number] Patient Account Number: [id number] Date of Birth/Sex: Treating RN: 08/24/1945 (74 y.o. Martin [name] Primary Care Cavon Nicolls: Martin [name] Other Clinician: [name] Referring Charlestine Rookstool: Treating Alyah Boehning/Extender: Sandria Bales in Treatment: 14 Encounter Discharge Information Items Discharge Condition: Stable Ambulatory Status:  Cane Discharge Destination: Home Transportation: Private Auto Accompanied By: self Schedule Follow-up Appointment: Yes Clinical Summary of Care: Patient Declined Electronic Signature(s) Signed: 03/29/2020 4:20:36 PM By: [name] EMT/HBOT/SD Entered By: [name] on 03/29/2020 12:56:00 -------------------------------------------------------------------------------- Patient/Caregiver Education Details Patient Name: Date of Service: Martin [name], [name] 12/1/2021andnbsp8:00 [place] Record Number: [id number] Patient Account Number: [id number] Date of Birth/Gender: Treating RN: 06-Jan-1946 (74 y.o. Martin [name] Primary Care Physician: Martin [name] Other Clinician: [name] Referring Physician: Treating Physician/Extender: Sandria Bales in Treatment: 14 Education Assessment Education Provided To: Patient Education Topics Provided Hyperbaric Oxygenation: Methods: Explain/Verbal Responses: State content correctly Electronic Signature(s) Signed: 03/29/2020 4:20:36 PM By: [name] EMT/HBOT/SD Entered By: [name] on 03/29/2020 12:55:48 -------------------------------------------------------------------------------- Vitals Details Patient Name: Date of Service: Martin Agee. 03/29/2020 8:00 A M Medical Record Number: [id number] Patient Account Number: [id number] Date of Birth/Sex: Treating RN: Apr 11, 1946 (74 y.o. Martin [name] Primary Care Neidy Guerrieri: Martin [name] Other Clinician: [name] Referring Sabel Hornbeck: Treating Koray Soter/Extender: Sandria Bales in Treatment: 14 Vital Signs Time Taken: 08:05 Temperature (F): 98 Height (in): 72 Pulse (bpm): 76 Weight (lbs): 270 Respiratory Rate (breaths/min): 16 Body Mass Index (BMI): 36.6 Blood Pressure (mmHg): 154/68 Capillary Blood Glucose (mg/dl): 180 Reference Range: 80 - 120 mg / dl Electronic Signature(s) Signed: 03/29/2020  4:20:36 PM By: [name] EMT/HBOT/SD Entered By: [name] on 03/29/2020 08:16:17

## 2020-03-29 NOTE — Progress Notes (Addendum)
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 Monday according to the pictures and what I saw. Fortunately there is no signs of active infection at this time which is great news. There is no evidence of systemic infection either. Overall I  feel like the patient is doing indeed much better and is just a matter of having him continue the antibiotic for the time being in my opinion and then depending on the results of the culture we may extend that for a bit longer. Objective Constitutional Well-nourished and well-hydrated in no acute distress. Vitals Time Taken: 10:46 AM, Height: 72 in, Weight: 270 lbs, BMI: 36.6, Temperature: 98.0 F, Pulse: 76 bpm, Respiratory Rate: 16 breaths/min, Blood Pressure: 154/68 mmHg, Capillary Blood Glucose: 180 mg/dl. Respiratory normal breathing without difficulty. Psychiatric this patient is able to make decisions and demonstrates good insight into disease process. Alert and Oriented x 3. pleasant and cooperative. General Notes: Upon inspection patient's wound bed actually showed signs of good granulation at this time. There does not appear to be any evidence of active infection which is great news and overall very pleased with where things stand today. He does have some undermining that I do not feel like this is too deep compared to the opening I think we will use silver alginate to pack and fill in the area we can still  go back to the total contact cast which the patient would like to do. There for that reason I did go ahead and reapply the total contact cast today Integumentary (Hair, Skin) Wound #5 status is Open. Original cause of wound was Gradually Appeared. The wound is located on the Left Metatarsal head fifth. The wound measures 0.5cm length x 0.6cm width x 0.4cm depth; 0.236cm^2 area and 0.094cm^3 volume. There is Fat Layer (Subcutaneous Tissue) exposed. There is no tunneling noted, however, there is undermining starting at 12:00 and ending at 12:00 with a maximum distance of 1.6cm. There is a medium amount of serous drainage noted. The wound margin is well defined and not attached to the wound base. There is large (67-100%) pink granulation within the wound bed. There is a small (1- 33%)  amount of necrotic tissue within the wound bed including Adherent Slough. 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 There was a T Contact otal Cast Procedure by Worthy Keeler, PA. Post procedure Diagnosis Wound #5: Same as Pre-Procedure Plan Follow-up Appointments: Return Appointment in 1 week. Dressing Change Frequency: Wound #5 Left Metatarsal head fifth: Do not change entire dressing for one week. Skin Barriers/Peri-Wound Care: Antifungal powder - nystatin powder to groins 2 times per day as needed for rash Moisturizing lotion - to left foot and leg Wound Cleansing: Wound #5 Left Metatarsal head fifth: May shower with protection. Primary Wound Dressing: Wound #5 Left Metatarsal head fifth: Calcium Alginate with Silver - pack into wound Secondary Dressing: Wound #5 Left Metatarsal head fifth: Foam - donut Dry Gauze - secure with tape Edema Control: Avoid standing for long periods of time Elevate legs to the level of the heart or above for 30 minutes daily and/or when sitting, a frequency of: Off-Loading: T Contact Cast to Left Lower Extremity otal Additional Orders / Instructions: Follow Nutritious Diet Other: - take one probiotic capsule daily Hyperbaric Oxygen Therapy: Evaluate for HBO Therapy Indication: - wagner grade 3 diabetic foot ulcer left 5th met head If appropriate for treatment, begin HBOT per protocol: 2.0 ATA for 90 Minutes without Air Breaks T Number of Treatments: - 40 otal One treatments per day (delivered Monday through Friday unless  otherwise specified in Special Instructions below): Finger stick Blood Glucose Pre- and Post- HBOT Treatment. Follow Hyperbaric Oxygen Glycemia Protocol Afrin (Oxymetazoline HCL) 0.05% nasal spray - 1 spray in both nostrils daily as needed prior to HBO treatment for difficulty clearing ears 1. Would recommend currently that we going to continue with the wound care measures as before and the patient is in agreement the plan that includes the use of a silver alginate dressing. 2. Also can reapply the total contact cast which I feel like is safe based on what I am seeing I feel like his infection is under better control with the antibiotic and we will see what the culture shows once that returns with the final. 3. I am also can recommend that the patient continue to proceed with hyperbaric oxygen therapy I think that still been beneficial for him up to this point this infection may have ensued as a result of the debridement which  was a little bit more extensive that I had to undertake at the last visit. We will see patient back for reevaluation in 1 week here in the clinic. If anything worsens or changes patient will contact our office for additional recommendations. Electronic Signature(s) Signed: 03/29/2020 11:33:24 AM By: Worthy Keeler PA-C Entered By: Worthy Keeler on 03/29/2020 11:33:23 -------------------------------------------------------------------------------- Total Contact Cast Details Patient Name: Date of Service: ARGYLE, GUSTAFSON 03/29/2020 10:00 A M Medical Record Number: [id number] Patient Account Number: [id number] Date of Birth/Sex: Treating RN: 05-Jul-1945 (74 y.o. [name], [name] Primary Care Provider: [name] Other Clinician: Referring Provider: Treating Provider/Extender: Sandria Bales in Treatment: 91 T Contact Cast Applied for Wound Assessment: otal Wound #5 Left Metatarsal head fifth Performed By: Physician Worthy Keeler,  PA Post Procedure Diagnosis Same as Pre-procedure Electronic Signature(s) Signed: 03/29/2020 4:34:22 PM By: Worthy Keeler PA-C Signed: 03/30/2020 5:27:06 PM By: Rhae Hammock RN Entered By: Rhae Hammock on 03/29/2020 11:28:32 -------------------------------------------------------------------------------- SuperBill Details Patient Name: Date of Service: Martin [name]. 03/29/2020 Medical Record Number: [id number] Patient Account Number: [id number] Date of Birth/Sex: Treating RN: 08-18-1945 (74 y.o. [name], [name] Primary Care Provider: [name] Other Clinician: Referring Provider: Treating Provider/Extender: Sandria Bales in Treatment: 14 Diagnosis Coding ICD-10 Codes Code Description [id number] Type 2 diabetes mellitus with foot ulcer L97.522 Non-pressure chronic ulcer of other part of left foot with fat layer exposed I10 Essential (primary) hypertension I25.10 Atherosclerotic heart disease of native coronary artery without angina pectoris M86.272 Subacute osteomyelitis, left ankle and foot Facility Procedures CPT4 Code: [id number] I Description: [phone] - APPLY TOTAL CONTACT LEG CAST ICD-10 Diagnosis Description CD-10 Diagnosis Description L97.522 Non-pressure chronic ulcer of other part of left foot with fat layer exposed Modifier: Quantity: 1 Physician Procedures : CPT4 Code Description Modifier [id number] - WC PHYS APPLY TOTAL CONTACT CAST ICD-10 Diagnosis Description L97.522 Non-pressure chronic ulcer of other part of left foot with fat layer exposed Quantity: 1 Electronic Signature(s) Signed: 03/29/2020 11:33:39 AM By: Worthy Keeler PA-C Entered By: Worthy Keeler on 03/29/2020 11:33:38

## 2020-03-30 ENCOUNTER — Encounter (HOSPITAL_BASED_OUTPATIENT_CLINIC_OR_DEPARTMENT_OTHER): Payer: PPO | Admitting: Internal Medicine

## 2020-03-30 ENCOUNTER — Other Ambulatory Visit: Payer: Self-pay

## 2020-03-30 DIAGNOSIS — L97514 Non-pressure chronic ulcer of other part of right foot with necrosis of bone: Secondary | ICD-10-CM | POA: Diagnosis not present

## 2020-03-30 DIAGNOSIS — E11621 Type 2 diabetes mellitus with foot ulcer: Secondary | ICD-10-CM | POA: Diagnosis not present

## 2020-03-30 LAB — AEROBIC CULTURE W GRAM STAIN (SUPERFICIAL SPECIMEN): Culture: NO GROWTH

## 2020-03-30 LAB — GLUCOSE, CAPILLARY
Glucose-Capillary: 147 mg/dL — ABNORMAL HIGH (ref 70–99)
Glucose-Capillary: 159 mg/dL — ABNORMAL HIGH (ref 70–99)

## 2020-03-30 NOTE — Progress Notes (Addendum)
Martin [name] ([id number]) Visit Report for 03/30/2020 HBO Details Patient Name: Date of Service: Martin [name] 03/30/2020 8:00 A M Medical Record Number: [id number] Patient Account Number: [id number] Date of Birth/Sex: Treating RN: 21-Aug-1945 (74 y.o. Martin [name], Martin [name] Primary Care Geniece Akers: [name] Other Clinician: [name] Referring Zykeriah Mathia: Treating Alfred Harrel/Extender: [name] in Treatment: 14 HBO Treatment Course Details Treatment Course Number: 1 Ordering Jaiyon Wander: Worthy Keeler T Treatments Ordered: otal 40 HBO Treatment Start Date: 02/22/2020 HBO Indication: Diabetic Ulcer(s) of the Lower Extremity HBO Treatment Details Treatment Number: 22 Patient Type: Outpatient Chamber Type: Monoplace Chamber Serial #: [id number] Treatment Protocol: 2.0 ATA with 90 minutes oxygen, and no air breaks Treatment Details Compression Rate Down: 2.0 psi / minute De-Compression Rate Up: 2.0 psi / minute Air breaks and breathing Decompress Decompress Compress Tx Pressure Begins Reached periods Begins Ends (leave unused spaces blank) Chamber Pressure (ATA 1 2 ------2 1 ) Clock Time (24 hr) 08:20 08:28 - - - - - - 09:58 10:06 Treatment Length: 106 (minutes) Treatment Segments: 4 Vital Signs Capillary Blood Glucose Reference Range: 80 - 120 mg / dl HBO Diabetic Blood Glucose Intervention Range: <131 mg/dl or >249 mg/dl Time Vitals Blood Respiratory Capillary Blood Glucose Pulse Action Type: Pulse: Temperature: Taken: Pressure: Rate: Glucose (mg/dl): Meter #: Oximetry (%) Taken: Pre 08:05 173/74 70 16 98.4 147 Post 10:08 148/70 70 18 98.2 159 Treatment Response Treatment Toleration: Well Treatment Completion Status: Treatment Completed without Adverse Event Additional Procedure Documentation Tissue Sevierity: Necrosis of bone Tiyah Zelenak Notes No concerns with treatment given Physician HBO Attestation: I certify that I supervised  this HBO treatment in accordance with Medicare guidelines. A trained emergency response team is readily available per Yes hospital policies and procedures. Continue HBOT as ordered. Yes Electronic Signature(s) Signed: 03/30/2020 5:09:23 PM By: Linton Ham MD Previous Signature: 03/30/2020 3:21:07 PM Version By: [name] EMT/HBOT/SD Previous Signature: 03/30/2020 3:21:07 PM Version By: [name] EMT/HBOT/SD Entered By: Linton Ham on 03/30/2020 17:07:51 -------------------------------------------------------------------------------- HBO Safety Checklist Details Patient Name: Date of Service: Martin [name]. 03/30/2020 8:00 A M Medical Record Number: [id number] Patient Account Number: [id number] Date of Birth/Sex: Treating RN: July 24, 1945 (74 y.o. Martin [name], Martin [name] Primary Care Ayuub Penley: [name] Other Clinician: [name] Referring Artrell Lawless: Treating Deng Kemler/Extender: [name] in Treatment: 14 HBO Safety Checklist Items Safety Checklist Consent Form Signed Patient voided / foley secured and emptied When did you last eato n/a Last dose of injectable or oral agent insulin Ostomy pouch emptied and vented if applicable NA All implantable devices assessed, documented and approved NA Intravenous access site secured and place NA Valuables secured Linens and cotton and cotton/polyester blend (less than 51% polyester) Personal oil-based products / skin lotions / body lotions removed Wigs or hairpieces removed NA Smoking or tobacco materials removed NA Books / newspapers / magazines / loose paper removed Cologne, aftershave, perfume and deodorant removed Jewelry removed (may wrap wedding band) Make-up removed NA Hair care products removed Battery operated devices (external) removed NA Heating patches and chemical warmers removed NA Titanium eyewear removed NA Nail polish cured greater than 10 hours NA Casting material  cured greater than 10 hours Hearing aids removed NA Loose dentures or partials removed NA Prosthetics have been removed NA Patient demonstrates correct use of air break device (if applicable) Patient concerns have been addressed Patient grounding bracelet on and cord attached to chamber Specifics for Inpatients (complete in addition to above) Medication sheet sent with  patient Intravenous medications needed or due during therapy sent with patient Drainage tubes (e.g. nasogastric tube or chest tube secured and vented) Endotracheal or Tracheotomy tube secured Cuff deflated of air and inflated with saline Airway suctioned Electronic Signature(s) Signed: 03/30/2020 8:23:13 AM By: [name] EMT/HBOT/SD Entered By: [name] on 03/30/2020 08:23:12

## 2020-03-30 NOTE — Progress Notes (Signed)
ORSON, RHO ([id number]) Visit Report for 03/30/2020 SuperBill Details Patient Name: Date of Service: Martin [name], Martin [name] 03/30/2020 Medical Record Number: [id number] Patient Account Number: [id number] Date of Birth/Sex: Treating RN: 1945/11/20 (74 y.o. [name], [name] Primary Care Provider: [name] Other Clinician: [name] Referring Provider: Treating Provider/Extender: [name] in Treatment: 14 Diagnosis Coding ICD-10 Codes Code Description [phone] Type 2 diabetes mellitus with foot ulcer L97.522 Non-pressure chronic ulcer of other part of left foot with fat layer exposed I10 Essential (primary) hypertension I25.10 Atherosclerotic heart disease of native coronary artery without angina pectoris M86.272 Subacute osteomyelitis, left ankle and foot Facility Procedures CPT4 Code Description Modifier Quantity [id number]-(Facility Use Only) HBOT full body chamber, 71min , 4 Physician Procedures Quantity CPT4 Code Description Modifier [id number] 09326 - WC PHYS HYPERBARIC OXYGEN THERAPY 1 ICD-10 Diagnosis Description E11.621 Type 2 diabetes mellitus with foot ulcer Electronic Signature(s) Signed: 03/30/2020 3:21:07 PM By: [name] EMT/HBOT/SD Signed: 03/30/2020 5:09:23 PM By: Linton Ham MD Entered By: [name] on 03/30/2020 11:45:34

## 2020-03-30 NOTE — Progress Notes (Signed)
SAKAI, WOLFORD ([id number]) Visit Report for 03/30/2020 Arrival Information Details Patient Name: Date of Service: Martin [name], Martin [name] 03/30/2020 8:00 A M Medical Record Number: [id number] Patient Account Number: [id number] Date of Birth/Sex: Treating RN: 01-May-1945 (74 y.o. Martin [name], [name] Primary Care Latesa Fratto: [name] Other Clinician: [name] Referring Princess Karnes: Treating Jewel Mcafee/Extender: [name] in Treatment: 49 Visit Information History Since Last Visit Added or deleted any medications: No Patient Arrived: Martin [name] Any new allergies or adverse reactions: No Arrival Time: 08:00 Had a fall or experienced change in No Accompanied By: self activities of daily living that may affect Transfer Assistance: None risk of falls: Patient Identification Verified: Yes Signs or symptoms of abuse/neglect since last visito No Secondary Verification Process Completed: Yes Hospitalized since last visit: No Patient Requires Transmission-Based Precautions: No Implantable device outside of the clinic excluding No Patient Has Alerts: No cellular tissue based products placed in the center since last visit: Pain Present Now: No Electronic Signature(s) Signed: 03/30/2020 3:21:07 PM By: [name] EMT/HBOT/SD Entered By: [name] on 03/30/2020 08:22:11 -------------------------------------------------------------------------------- Encounter Discharge Information Details Patient Name: Date of Service: Martin [name]. 03/30/2020 8:00 A M Medical Record Number: [id number] Patient Account Number: [id number] Date of Birth/Sex: Treating RN: 09-18-1945 (74 y.o. [name] Primary Care Louay Myrie: [name] Other Clinician: [name] Referring Ravleen Ries: Treating Ladasha Schnackenberg/Extender: [name] in Treatment: 14 Encounter Discharge Information Items Discharge Condition: Stable Ambulatory Status: Cane Discharge  Destination: Home Transportation: Private Auto Accompanied By: self Schedule Follow-up Appointment: Yes Clinical Summary of Care: Patient Declined Electronic Signature(s) Signed: 03/30/2020 3:21:07 PM By: [name] EMT/HBOT/SD Entered By: [name] on 03/30/2020 11:46:10 -------------------------------------------------------------------------------- Patient/Caregiver Education Details Patient Name: Date of Service: Martin [name], [name] 12/2/2021andnbsp8:00 [place] Record Number: [id number] Patient Account Number: [id number] Date of Birth/Gender: Treating RN: April 28, 1946 (74 y.o. [name] Primary Care Physician: [name] Other Clinician: [name] Referring Physician: Treating Physician/Extender: [name] in Treatment: 14 Education Assessment Education Provided To: Patient Education Topics Provided Hyperbaric Oxygenation: Methods: Explain/Verbal Responses: State content correctly Electronic Signature(s) Signed: 03/30/2020 3:21:07 PM By: [name] EMT/HBOT/SD Entered By: [name] on 03/30/2020 11:45:52 -------------------------------------------------------------------------------- Vitals Details Patient Name: Date of Service: Martin [name]. 03/30/2020 8:00 A M Medical Record Number: [id number] Patient Account Number: [id number] Date of Birth/Sex: Treating RN: [birth year] (74 y.o. Martin [name], [name] Primary Care Yarnell Arvidson: [name] Other Clinician: [name] Referring Emilian Stawicki: Treating Edyn Qazi/Extender: [name] in Treatment: 14 Vital Signs Time Taken: 08:05 Temperature (F): 98.4 Height (in): 72 Pulse (bpm): 70 Weight (lbs): 270 Respiratory Rate (breaths/min): 16 Body Mass Index (BMI): 36.6 Blood Pressure (mmHg): 173/74 Capillary Blood Glucose (mg/dl): 147 Reference Range: 80 - 120 mg / dl Electronic Signature(s) Signed: 03/30/2020 3:21:07 PM By: [name] EMT/HBOT/SD Entered By: [name] on 03/30/2020 08:22:27

## 2020-03-31 ENCOUNTER — Encounter (HOSPITAL_BASED_OUTPATIENT_CLINIC_OR_DEPARTMENT_OTHER): Payer: PPO | Admitting: Internal Medicine

## 2020-04-03 ENCOUNTER — Other Ambulatory Visit: Payer: Self-pay

## 2020-04-03 ENCOUNTER — Encounter (HOSPITAL_BASED_OUTPATIENT_CLINIC_OR_DEPARTMENT_OTHER): Payer: PPO | Admitting: Internal Medicine

## 2020-04-03 DIAGNOSIS — E11621 Type 2 diabetes mellitus with foot ulcer: Secondary | ICD-10-CM | POA: Diagnosis not present

## 2020-04-03 DIAGNOSIS — L97514 Non-pressure chronic ulcer of other part of right foot with necrosis of bone: Secondary | ICD-10-CM | POA: Diagnosis not present

## 2020-04-03 LAB — GLUCOSE, CAPILLARY
Glucose-Capillary: 263 mg/dL — ABNORMAL HIGH (ref 70–99)
Glucose-Capillary: 221 mg/dL — ABNORMAL HIGH (ref 70–99)

## 2020-04-03 NOTE — Progress Notes (Signed)
[name], [name] ([id number]) Visit Report for 04/03/2020 Arrival Information Details Patient Name: Date of Service: Martin, [name] 04/03/2020 10:00 A M Medical Record Number: [id number] Patient Account Number: [id number] Date of Birth/Sex: Treating RN: 12/10/1945 (74 y.o. Martin Eva, Briant [name] Primary Care Avian Greenawalt: [name] Other Clinician: [name] Referring Roscoe Witts: Treating Mio Schellinger/Extender: [name] in Treatment: 109 Visit Information History Since Last Visit Added or deleted any medications: No Patient Arrived: Martin [name] Any new allergies or adverse reactions: No Arrival Time: 10:00 Had a fall or experienced change in No Accompanied By: self activities of daily living that may affect Transfer Assistance: None risk of falls: Patient Identification Verified: Yes Signs or symptoms of abuse/neglect since last visito No Secondary Verification Process Completed: Yes Hospitalized since last visit: No Patient Requires Transmission-Based Precautions: No Implantable device outside of the clinic excluding No Patient Has Alerts: No cellular tissue based products placed in the center since last visit: Pain Present Now: No Electronic Signature(s) Signed: 04/03/2020 4:25:10 PM By: [name] EMT/HBOT/SD Entered By: [name] on 04/03/2020 10:19:01 -------------------------------------------------------------------------------- Encounter Discharge Information Details Patient Name: Date of Service: Martin [name]. 04/03/2020 10:00 A M Medical Record Number: [id number] Patient Account Number: [id number] Date of Birth/Sex: Treating RN: April 17, 1946 (74 y.o. Martin [name] Primary Care Rivan Siordia: [name] Other Clinician: [name] Referring Colvin Blatt: Treating Juanice Warburton/Extender: [name] in Treatment: 14 Encounter Discharge Information Items Discharge Condition: Stable Ambulatory Status:  Cane Discharge Destination: Home Transportation: Private Auto Accompanied By: self Schedule Follow-up Appointment: Yes Clinical Summary of Care: Patient Declined Electronic Signature(s) Signed: 04/03/2020 4:25:10 PM By: [name] EMT/HBOT/SD Entered By: [name] on 04/03/2020 13:39:14 -------------------------------------------------------------------------------- Patient/Caregiver Education Details Patient Name: Date of Service: [name], [name] 12/6/2021andnbsp10:00 [place] Record Number: [id number] Patient Account Number: [id number] Date of Birth/Gender: Treating RN: 1946-04-25 (75 y.o. Martin [name] Primary Care Physician: [name] Other Clinician: [name] Referring Physician: Treating Physician/Extender: [name] in Treatment: 14 Education Assessment Education Provided To: Patient Education Topics Provided Hyperbaric Oxygenation: Methods: Explain/Verbal Responses: State content correctly Electronic Signature(s) Signed: 04/03/2020 4:25:10 PM By: [name] EMT/HBOT/SD Entered By: [name] on 04/03/2020 13:39:01 -------------------------------------------------------------------------------- Vitals Details Patient Name: Date of Service: Martin [name]. 04/03/2020 10:00 A M Medical Record Number: [id number] Patient Account Number: [id number] Date of Birth/Sex: Treating RN: [birth year] (74 y.o. Martin [name] Primary Care Kitt Ledet: [name] Other Clinician: [name] Referring Rayneisha Bouza: Treating Avigdor Dollar/Extender: [name] in Treatment: 14 Vital Signs Time Taken: 10:05 Temperature (F): 98.2 Height (in): 72 Pulse (bpm): 84 Weight (lbs): 270 Respiratory Rate (breaths/min): 20 Body Mass Index (BMI): 36.6 Blood Pressure (mmHg): 176/69 Capillary Blood Glucose (mg/dl): 263 Reference Range: 80 - 120 mg / dl Electronic Signature(s) Signed: 04/03/2020  4:25:10 PM By: [name] EMT/HBOT/SD Entered By: [name] on 04/03/2020 10:19:15

## 2020-04-03 NOTE — Progress Notes (Addendum)
Martin [name], Martin [name] ([id number]) Visit Report for 04/03/2020 HBO Details Patient Name: Date of Service: Martin [name], Martin [name] 04/03/2020 10:00 A M Medical Record Number: [id number] Patient Account Number: [id number] Date of Birth/Sex: Treating RN: Martin [name], Martin (74 y.o. Martin [name] Primary Care Martin [name]: Martin [name] Other Clinician: Mikeal [name] Referring Martin [name]: Treating Martin [name]/Extender: Martin [name] in Treatment: 14 HBO Treatment Course Details Treatment Course Number: 1 Ordering Martin [name]: Martin [name] T Treatments Ordered: otal 40 HBO Treatment Start Date: 02/22/2020 HBO Indication: Diabetic Ulcer(s) of the Lower Extremity HBO Treatment Details Treatment Number: 23 Patient Type: Outpatient Chamber Type: Monoplace Chamber Serial #: [id number] Treatment Protocol: 2.0 ATA with 90 minutes oxygen, and no air breaks Treatment Details Compression Rate Down: 2.0 psi / minute De-Compression Rate Up: 2.0 psi / minute Air breaks and breathing Decompress Decompress Compress Tx Pressure Begins Reached periods Begins Ends (leave unused spaces blank) Chamber Pressure (ATA 1 2 ------2 1 ) Clock Time (24 hr) 10:24 10:32 - - - - - - 12:02 12:08 Treatment Length: 104 (minutes) Treatment Segments: 3 Vital Signs Capillary Blood Glucose Reference Range: 80 - 120 mg / dl HBO Diabetic Blood Glucose Intervention Range: <131 mg/dl or >249 mg/dl Time Vitals Blood Respiratory Capillary Blood Glucose Pulse Action Type: Pulse: Temperature: Taken: Pressure: Rate: Glucose (mg/dl): Meter #: Oximetry (%) Taken: Pre 10:05 176/69 84 20 98.2 263 Post 12:10 145/84 70 18 98 221 Treatment Response Treatment Toleration: Well Treatment Completion Status: Treatment Completed without Adverse Event Additional Procedure Documentation Tissue Sevierity: Necrosis of bone Martin [name] Notes No concerns with treatment given Physician HBO Attestation: I certify that I supervised  this HBO treatment in accordance with Medicare guidelines. A trained emergency response team is readily available per Yes hospital policies and procedures. Continue HBOT as ordered. Yes Electronic Signature(s) Signed: 04/04/2020 9:01:39 AM By: Martin Ham MD Previous Signature: 04/03/2020 4:25:10 PM Version By: Martin [name] EMT/HBOT/SD Previous Signature: 04/03/2020 4:25:10 PM Version By: Martin [name] EMT/HBOT/SD Entered By: Martin [name] on 04/03/2020 16:47:33 -------------------------------------------------------------------------------- HBO Safety Checklist Details Patient Name: Date of Service: Martin [name]. 04/03/2020 10:00 A M Medical Record Number: [id number] Patient Account Number: [id number] Date of Birth/Sex: Treating RN: [birth year] ([name] y.o. Martin [name] Primary Care Martin [name]: Martin [name] Other Clinician: Mikeal [name] Referring Martin [name]: Treating Martin [name]/Extender: Martin [name] in Treatment: 14 HBO Safety Checklist Items Safety Checklist Consent Form Signed Patient voided / foley secured and emptied When did you last eato 0900 - pancakes/sausage Last dose of injectable or oral agent insulin Ostomy pouch emptied and vented if applicable NA All implantable devices assessed, documented and approved NA Intravenous access site secured and place NA Valuables secured Linens and cotton and cotton/polyester blend (less than 51% polyester) Personal oil-based products / skin lotions / body lotions removed Wigs or hairpieces removed Smoking or tobacco materials removed NA Books / newspapers / magazines / loose paper removed NA Cologne, aftershave, perfume and deodorant removed Jewelry removed (may wrap wedding band) Make-up removed NA Hair care products removed Battery operated devices (external) removed NA Heating patches and chemical warmers removed NA Titanium eyewear removed NA Nail polish cured greater than 10  hours NA Casting material cured greater than 10 hours Hearing aids removed NA Loose dentures or partials removed NA Prosthetics have been removed NA Patient demonstrates correct use of air break device (if applicable) Patient concerns have been addressed Patient grounding bracelet on and cord attached to chamber Specifics for Inpatients (complete in addition to above) Medication sheet  sent with patient Intravenous medications needed or due during therapy sent with patient Drainage tubes (e.g. nasogastric tube or chest tube secured and vented) Endotracheal or Tracheotomy tube secured Cuff deflated of air and inflated with saline Airway suctioned Electronic Signature(s) Signed: 04/03/2020 10:21:30 AM By: Martin [name] EMT/HBOT/SD Previous Signature: 04/03/2020 10:19:51 AM Version By: Martin [name] EMT/HBOT/SD Entered By: Martin [name] on 04/03/2020 10:21:29

## 2020-04-04 ENCOUNTER — Encounter (HOSPITAL_BASED_OUTPATIENT_CLINIC_OR_DEPARTMENT_OTHER): Payer: PPO | Admitting: Internal Medicine

## 2020-04-04 ENCOUNTER — Other Ambulatory Visit: Payer: Self-pay

## 2020-04-04 DIAGNOSIS — L97514 Non-pressure chronic ulcer of other part of right foot with necrosis of bone: Secondary | ICD-10-CM | POA: Diagnosis not present

## 2020-04-04 DIAGNOSIS — E11621 Type 2 diabetes mellitus with foot ulcer: Secondary | ICD-10-CM | POA: Diagnosis not present

## 2020-04-04 LAB — GLUCOSE, CAPILLARY
Glucose-Capillary: 138 mg/dL — ABNORMAL HIGH (ref 70–99)
Glucose-Capillary: 172 mg/dL — ABNORMAL HIGH (ref 70–99)

## 2020-04-04 NOTE — Progress Notes (Signed)
CORNELIOUS, BARTOLUCCI ([id number]) Visit Report for 04/04/2020 SuperBill Details Patient Name: Date of Service: Martin, [name] 04/04/2020 Medical Record Number: [id number] Patient Account Number: [id number] Date of Birth/Sex: Treating RN: [birth year] (74 y.o. 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

## 2020-04-04 NOTE — Progress Notes (Signed)
KEMPER, HEUPEL ([id number]) Visit Report for 04/03/2020 SuperBill Details Patient Name: Date of Service: Martin [name], Martin [name] 04/03/2020 Medical Record Number: [id number] Patient Account Number: [id number] Date of Birth/Sex: Treating RN: March 26, 1946 (74 y.o. 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

## 2020-04-04 NOTE — Progress Notes (Addendum)
JOAB, CARDEN ([id number]) Visit Report for 04/04/2020 HBO Details Patient Name: Date of Service: Martin, [name] 04/04/2020 8:00 A M Medical Record Number: [id number] Patient Account Number: [id number] Date of Birth/Sex: Treating RN: Nov 01, 1945 (74 y.o. Martin [name]) Carlene Coria Primary Care Jari Carollo: [name] Other Clinician: [name] Referring Danila Eddie: Treating Evo Aderman/Extender: [name] in Treatment: 14 HBO Treatment Course Details Treatment Course Number: 1 Ordering Aashi Derrington: Worthy Keeler T Treatments Ordered: otal 40 HBO Treatment Start Date: 02/22/2020 HBO Indication: Diabetic Ulcer(s) of the Lower Extremity HBO Treatment Details Treatment Number: 24 Patient Type: Outpatient Chamber Type: Monoplace Chamber Serial #: [id number] Treatment Protocol: 2.0 ATA with 90 minutes oxygen, and no air breaks Treatment Details Compression Rate Down: 2.0 psi / minute De-Compression Rate Up: 2.0 psi / minute Air breaks and breathing Decompress Decompress Compress Tx Pressure Begins Reached periods Begins Ends (leave unused spaces blank) Chamber Pressure (ATA 1 2 ------2 1 ) Clock Time (24 hr) 08:19 08:27 - - - - - - 09:57 10:05 Treatment Length: 106 (minutes) Treatment Segments: 4 Vital Signs Capillary Blood Glucose Reference Range: 80 - 120 mg / dl HBO Diabetic Blood Glucose Intervention Range: <131 mg/dl or >249 mg/dl Time Vitals Blood Respiratory Capillary Blood Glucose Pulse Action Type: Pulse: Temperature: Taken: Pressure: Rate: Glucose (mg/dl): Meter #: Oximetry (%) Taken: Pre 07:55 164/76 80 15 97.8 138 Post 10:07 141/75 74 18 97.9 172 Treatment Response Treatment Toleration: Well Treatment Completion Status: Treatment Completed without Adverse Event Additional Procedure Documentation Tissue Sevierity: Necrosis of bone Areil Ottey Notes No concerns with treatment given Physician HBO Attestation: I certify that I supervised  this HBO treatment in accordance with Medicare guidelines. A trained emergency response team is readily available per Yes hospital policies and procedures. Continue HBOT as ordered. Yes Electronic Signature(s) Signed: 04/04/2020 5:06:48 PM By: Linton Ham MD Previous Signature: 04/04/2020 3:05:03 PM Version By: [name] EMT/HBOT/SD Previous Signature: 04/04/2020 3:05:03 PM Version By: [name] EMT/HBOT/SD Entered By: Linton Ham on 04/04/2020 16:53:57 -------------------------------------------------------------------------------- HBO Safety Checklist Details Patient Name: Date of Service: Martin [name]. 04/04/2020 8:00 A M Medical Record Number: [id number] Patient Account Number: [id number] Date of Birth/Sex: Treating RN: Sep 02, 1945 (74 y.o. Martin [name]) Carlene Coria Primary Care Estefanny Moler: [name] Other Clinician: [name] Referring Momodou Consiglio: Treating Dellanira Dillow/Extender: [name] in Treatment: 14 HBO Safety Checklist Items Safety Checklist Consent Form Signed Patient voided / foley secured and emptied When did you last eato 0800 - glucerna Last dose of injectable or oral agent insulin Ostomy pouch emptied and vented if applicable NA All implantable devices assessed, documented and approved NA Intravenous access site secured and place NA Valuables secured Linens and cotton and cotton/polyester blend (less than 51% polyester) Personal oil-based products / skin lotions / body lotions removed Wigs or hairpieces removed NA Smoking or tobacco materials removed NA Books / newspapers / magazines / loose paper removed Cologne, aftershave, perfume and deodorant removed Jewelry removed (may wrap wedding band) Make-up removed NA Hair care products removed Battery operated devices (external) removed NA Heating patches and chemical warmers removed NA Titanium eyewear removed NA Nail polish cured greater than 10  hours NA Casting material cured greater than 10 hours Hearing aids removed NA Loose dentures or partials removed NA Prosthetics have been removed NA Patient demonstrates correct use of air break device (if applicable) Patient concerns have been addressed Patient grounding bracelet on and cord attached to chamber Specifics for Inpatients (complete in addition to above) Medication sheet  sent with patient Intravenous medications needed or due during therapy sent with patient Drainage tubes (e.g. nasogastric tube or chest tube secured and vented) Endotracheal or Tracheotomy tube secured Cuff deflated of air and inflated with saline Airway suctioned Electronic Signature(s) Signed: 04/04/2020 8:26:02 AM By: [name] EMT/HBOT/SD Entered By: [name] on 04/04/2020 08:26:02

## 2020-04-04 NOTE — Progress Notes (Signed)
Martin [name] ([id number]) Visit Report for 04/04/2020 Arrival Information Details Patient Name: Date of Service: [name], [name] 04/04/2020 8:00 A M Medical Record Number: [id number] Patient Account Number: [id number] Date of Birth/Sex: Treating RN: 1946-03-08 (74 y.o. [name]) Dolores Lory, Morey Hummingbird Primary Care Johniece Hornbaker: [name] Other Clinician: [name] Referring Deuntae Kocsis: Treating Ivin Rosenbloom/Extender: [name] in Treatment: 5 Visit Information History Since Last Visit Added or deleted any medications: No Patient Arrived: Martin [name] Any new allergies or adverse reactions: No Arrival Time: 07:50 Had a fall or experienced change in No Accompanied By: self activities of daily living that may affect Transfer Assistance: None risk of falls: Patient Identification Verified: Yes Signs or symptoms of abuse/neglect since last visito No Secondary Verification Process Completed: Yes Hospitalized since last visit: No Patient Requires Transmission-Based Precautions: No Implantable device outside of the clinic excluding No Patient Has Alerts: No cellular tissue based products placed in the center since last visit: Pain Present Now: No Electronic Signature(s) Signed: 04/04/2020 3:05:03 PM By: [name] EMT/HBOT/SD Entered By: [name] on 04/04/2020 08:24:30 -------------------------------------------------------------------------------- Encounter Discharge Information Details Patient Name: Date of Service: Martin [name]. 04/04/2020 8:00 A M Medical Record Number: [id number] Patient Account Number: [id number] Date of Birth/Sex: Treating RN: [birth year] (74 y.o. [name]) Carlene Coria Primary Care Duane Earnshaw: [name] Other Clinician: [name] Referring Glena Pharris: Treating Yuri Fana/Extender: [name] in Treatment: 14 Encounter Discharge Information Items Discharge Condition: Stable Ambulatory Status: Cane Discharge  Destination: Home Transportation: Private Auto Accompanied By: self Schedule Follow-up Appointment: Yes Clinical Summary of Care: Patient Declined Electronic Signature(s) Signed: 04/04/2020 3:05:03 PM By: [name] EMT/HBOT/SD Entered By: [name] on 04/04/2020 10:19:49 -------------------------------------------------------------------------------- Patient/Caregiver Education Details Patient Name: Date of Service: Martin [name] 12/7/2021andnbsp8:00 A M Medical Record Number: [id number] Patient Account Number: [id number] Date of Birth/Gender: Treating RN: 1946-03-31 (74 y.o. [name]) Carlene Coria Primary Care Physician: [name] Other Clinician: [name] Referring Physician: Treating Physician/Extender: [name] in Treatment: 14 Education Assessment Education Provided To: Patient Education Topics Provided Hyperbaric Oxygenation: Methods: Explain/Verbal Responses: State content correctly Electronic Signature(s) Signed: 04/04/2020 3:05:03 PM By: [name] EMT/HBOT/SD Entered By: [name] on 04/04/2020 10:19:36 -------------------------------------------------------------------------------- Vitals Details Patient Name: Date of Service: Martin [name]. 04/04/2020 8:00 A M Medical Record Number: [id number] Patient Account Number: [id number] Date of Birth/Sex: Treating RN: 19-Jul-1945 (74 y.o. [name]) Carlene Coria Primary Care Kloe Oates: [name] Other Clinician: [name] Referring Matthews Franks: Treating Brileigh Sevcik/Extender: [name] in Treatment: 14 Vital Signs Time Taken: 07:55 Temperature (F): 97.8 Height (in): 72 Pulse (bpm): 80 Weight (lbs): 270 Respiratory Rate (breaths/min): 15 Body Mass Index (BMI): 36.6 Blood Pressure (mmHg): 164/76 Capillary Blood Glucose (mg/dl): 138 Reference Range: 80 - 120 mg / dl Notes Pt given glucerna. Javante Nilsson notified Electronic  Signature(s) Signed: 04/04/2020 8:25:14 AM By: [name] EMT/HBOT/SD Entered By: [name] on 04/04/2020 08:25:14

## 2020-04-05 ENCOUNTER — Encounter (HOSPITAL_BASED_OUTPATIENT_CLINIC_OR_DEPARTMENT_OTHER): Payer: PPO | Admitting: Physician Assistant

## 2020-04-06 ENCOUNTER — Other Ambulatory Visit: Payer: Self-pay

## 2020-04-06 ENCOUNTER — Encounter (HOSPITAL_BASED_OUTPATIENT_CLINIC_OR_DEPARTMENT_OTHER): Payer: PPO | Admitting: Internal Medicine

## 2020-04-06 DIAGNOSIS — L97514 Non-pressure chronic ulcer of other part of right foot with necrosis of bone: Secondary | ICD-10-CM | POA: Diagnosis not present

## 2020-04-06 DIAGNOSIS — E11621 Type 2 diabetes mellitus with foot ulcer: Secondary | ICD-10-CM | POA: Diagnosis not present

## 2020-04-06 LAB — GLUCOSE, CAPILLARY
Glucose-Capillary: 164 mg/dL — ABNORMAL HIGH (ref 70–99)
Glucose-Capillary: 168 mg/dL — ABNORMAL HIGH (ref 70–99)

## 2020-04-06 NOTE — Progress Notes (Addendum)
LEGRAND, LASSER ([id number]) Visit Report for 04/06/2020 HBO Details Patient Name: Date of Service: Martin [name], Martin [name] 04/06/2020 8:00 A M Medical Record Number: [id number] Patient Account Number: [id number] Date of Birth/Sex: Treating RN: Aug 27, 1945 (74 y.o. [name], [name] Primary Care Laurianne Floresca: [name] Other Clinician: [name] Referring Emmersyn Kratzke: Treating Davionne Dowty/Extender: [name] in Treatment: 15 HBO Treatment Course Details Treatment Course Number: 1 Ordering Myca Perno: Worthy Keeler T Treatments Ordered: otal 40 HBO Treatment Start Date: 02/22/2020 HBO Indication: Diabetic Ulcer(s) of the Lower Extremity HBO Treatment Details Treatment Number: 25 Patient Type: Outpatient Chamber Type: Monoplace Chamber Serial #: [id number] Treatment Protocol: 2.0 ATA with 90 minutes oxygen, and no air breaks Treatment Details Compression Rate Down: 2.0 psi / minute De-Compression Rate Up: 2.0 psi / minute Air breaks and breathing Decompress Decompress Compress Tx Pressure Begins Reached periods Begins Ends (leave unused spaces blank) Chamber Pressure (ATA 1 2 ------2 1 ) Clock Time (24 hr) 08:09 08:17 - - - - - - 09:47 09:55 Treatment Length: 106 (minutes) Treatment Segments: 4 Vital Signs Capillary Blood Glucose Reference Range: 80 - 120 mg / dl HBO Diabetic Blood Glucose Intervention Range: <131 mg/dl or >249 mg/dl Time Vitals Blood Respiratory Capillary Blood Glucose Pulse Action Type: Pulse: Temperature: Taken: Pressure: Rate: Glucose (mg/dl): Meter #: Oximetry (%) Taken: Pre 07:50 166/83 70 16 98.1 164 Post 09:57 160/85 71 15 98.3 168 Treatment Response Treatment Toleration: Well Treatment Completion Status: Treatment Completed without Adverse Event Additional Procedure Documentation Tissue Sevierity: Necrosis of bone Electronic Signature(s) Signed: 04/06/2020 3:21:47 PM By: [name] EMT/HBOT/SD Signed: 04/06/2020  3:37:20 PM By: Linton Ham MD Entered By: [name] on 04/06/2020 10:31:03 -------------------------------------------------------------------------------- HBO Safety Checklist Details Patient Name: Date of Service: Martin [name]. 04/06/2020 8:00 A M Medical Record Number: [id number] Patient Account Number: [id number] Date of Birth/Sex: Treating RN: Feb 04, 1946 (74 y.o. [name], [name] Primary Care Parth Mccormac: [name] Other Clinician: [name] Referring Kalysta Kneisley: Treating Veena Sturgess/Extender: [name] in Treatment: 15 HBO Safety Checklist Items Safety Checklist Consent Form Signed Patient voided / foley secured and emptied When did you last eato n/a Last dose of injectable or oral agent insulin Ostomy pouch emptied and vented if applicable NA All implantable devices assessed, documented and approved NA Intravenous access site secured and place NA Valuables secured Linens and cotton and cotton/polyester blend (less than 51% polyester) Personal oil-based products / skin lotions / body lotions removed Wigs or hairpieces removed NA Smoking or tobacco materials removed NA Books / newspapers / magazines / loose paper removed Cologne, aftershave, perfume and deodorant removed Jewelry removed (may wrap wedding band) Make-up removed NA Hair care products removed Battery operated devices (external) removed NA Heating patches and chemical warmers removed NA Titanium eyewear removed NA Nail polish cured greater than 10 hours NA Casting material cured greater than 10 hours Hearing aids removed NA Loose dentures or partials removed NA Prosthetics have been removed NA Patient demonstrates correct use of air break device (if applicable) Patient concerns have been addressed Patient grounding bracelet on and cord attached to chamber Specifics for Inpatients (complete in addition to above) Medication sheet sent with  patient Intravenous medications needed or due during therapy sent with patient Drainage tubes (e.g. nasogastric tube or chest tube secured and vented) Endotracheal or Tracheotomy tube secured Cuff deflated of air and inflated with saline Airway suctioned Electronic Signature(s) Signed: 04/06/2020 8:12:13 AM By: [name] EMT/HBOT/SD Entered By: [name] on 04/06/2020 08:12:13

## 2020-04-06 NOTE — Progress Notes (Signed)
MACHAI, DESMITH ([id number]) Visit Report for 04/06/2020 Arrival Information Details Patient Name: Date of Service: CYRIL, WOODMANSEE 04/06/2020 8:00 A M Medical Record Number: [id number] Patient Account Number: [id number] Date of Birth/Sex: Treating RN: 03-13-1946 (74 y.o. [name], [name] Primary Care Dmani Mizer: [name] Other Clinician: [name] Referring Arvie Bartholomew: Treating Zaraya Delauder/Extender: [name] in Treatment: 15 Visit Information History Since Last Visit Added or deleted any medications: No Patient Arrived: [name] Any new allergies or adverse reactions: No Arrival Time: 07:45 Had a fall or experienced change in No Accompanied By: self activities of daily living that may affect Transfer Assistance: None risk of falls: Patient Identification Verified: Yes Signs or symptoms of abuse/neglect since last visito No Secondary Verification Process Completed: Yes Hospitalized since last visit: No Patient Requires Transmission-Based Precautions: No Implantable device outside of the clinic excluding No Patient Has Alerts: No cellular tissue based products placed in the center since last visit: Pain Present Now: No Electronic Signature(s) Signed: 04/06/2020 3:21:47 PM By: [name] EMT/HBOT/SD Entered By: [name] on 04/06/2020 08:11:24 -------------------------------------------------------------------------------- Encounter Discharge Information Details Patient Name: Date of Service: [name] 04/06/2020 8:00 A M Medical Record Number: [id number] Patient Account Number: [id number] Date of Birth/Sex: Treating RN: [birth year] (74 y.o. [name] Primary Care Nova Schmuhl: [name] Other Clinician: [name] Referring Brooklynn Brandenburg: Treating Arnella Pralle/Extender: [name] in Treatment: 15 Encounter Discharge Information Items Discharge Condition: Stable Ambulatory Status: Cane Discharge  Destination: Home Transportation: Private Auto Accompanied By: self Schedule Follow-up Appointment: Yes Clinical Summary of Care: Patient Declined Electronic Signature(s) Signed: 04/06/2020 3:21:47 PM By: [name] EMT/HBOT/SD Entered By: [name] on 04/06/2020 10:31:54 -------------------------------------------------------------------------------- Patient/Caregiver Education Details Patient Name: Date of Service: [place], [place] W. 12/9/2021andnbsp8:00 [place] Record Number: [id number] Patient Account Number: [id number] Date of Birth/Gender: Treating RN: Sep 21, 1945 (74 y.o. [name] Primary Care Physician: [name] Other Clinician: [name] Referring Physician: Treating Physician/Extender: [name] in Treatment: 15 Education Assessment Education Provided To: Patient Education Topics Provided Hyperbaric Oxygenation: Methods: Explain/Verbal Responses: State content correctly Electronic Signature(s) Signed: 04/06/2020 3:21:47 PM By: [name] EMT/HBOT/SD Entered By: [name] on 04/06/2020 10:31:25 -------------------------------------------------------------------------------- Vitals Details Patient Name: Date of Service: [name] 04/06/2020 8:00 A M Medical Record Number: [id number] Patient Account Number: [id number] Date of Birth/Sex: Treating RN: April 09, 1946 (74 y.o. [name], [name] Primary Care Monteen Toops: [name] Other Clinician: [name] Referring Leroy Trim: Treating Amiaya Mcneeley/Extender: [name] in Treatment: 15 Vital Signs Time Taken: 07:50 Temperature (F): 98.1 Height (in): 72 Pulse (bpm): 70 Weight (lbs): 270 Respiratory Rate (breaths/min): 16 Body Mass Index (BMI): 36.6 Blood Pressure (mmHg): 166/83 Capillary Blood Glucose (mg/dl): 164 Reference Range: 80 - 120 mg / dl Electronic Signature(s) Signed: 04/06/2020 3:21:47 PM By: [name] EMT/HBOT/SD Entered By: [name] on 04/06/2020 08:11:39

## 2020-04-06 NOTE — Progress Notes (Signed)
SANDIP, POWER ([id number]) Visit Report for 04/06/2020 SuperBill Details Patient Name: Date of Service: Martin, [name] 04/06/2020 Medical Record Number: [id number] Patient Account Number: [id number] Date of Birth/Sex: Treating RN: 05/29/1945 (74 y.o. 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

## 2020-04-07 ENCOUNTER — Other Ambulatory Visit: Payer: Self-pay

## 2020-04-07 ENCOUNTER — Encounter (HOSPITAL_BASED_OUTPATIENT_CLINIC_OR_DEPARTMENT_OTHER): Payer: PPO | Admitting: Internal Medicine

## 2020-04-07 DIAGNOSIS — L97522 Non-pressure chronic ulcer of other part of left foot with fat layer exposed: Secondary | ICD-10-CM | POA: Diagnosis not present

## 2020-04-07 DIAGNOSIS — L97514 Non-pressure chronic ulcer of other part of right foot with necrosis of bone: Secondary | ICD-10-CM | POA: Diagnosis not present

## 2020-04-07 DIAGNOSIS — E11621 Type 2 diabetes mellitus with foot ulcer: Secondary | ICD-10-CM | POA: Diagnosis not present

## 2020-04-07 LAB — GLUCOSE, CAPILLARY
Glucose-Capillary: 149 mg/dL — ABNORMAL HIGH (ref 70–99)
Glucose-Capillary: 149 mg/dL — ABNORMAL HIGH (ref 70–99)

## 2020-04-07 NOTE — Progress Notes (Signed)
Martin [name], Martin [name] ([id number]) Visit Report for 04/07/2020 Arrival Information Details Patient Name: Date of Service: Martin [name], Martin [name] 04/07/2020 9:00 A M Medical Record Number: [id number] Patient Account Number: [id number] Date of Birth/Sex: Treating RN: 07/23/1945 (74 y.o. Martin [name], Martin [name] Primary Care Caraline Deutschman: [name] Other Clinician: [name] Referring Dalaina Tates: Treating Ayad Nieman/Extender: [name] in Treatment: 15 Visit Information History Since Last Visit Added or deleted any medications: No Patient Arrived: Martin [name] Any new allergies or adverse reactions: No Arrival Time: 09:10 Had a fall or experienced change in No Accompanied By: self activities of daily living that may affect Transfer Assistance: None risk of falls: Patient Identification Verified: Yes Signs or symptoms of abuse/neglect since last visito No Secondary Verification Process Completed: Yes Hospitalized since last visit: No Patient Requires Transmission-Based Precautions: No Implantable device outside of the clinic excluding No Patient Has Alerts: No cellular tissue based products placed in the center since last visit: Pain Present Now: No Electronic Signature(s) Signed: 04/07/2020 3:25:04 PM By: [name] EMT/HBOT/SD Entered By: [name] on 04/07/2020 10:40:10 -------------------------------------------------------------------------------- Encounter Discharge Information Details Patient Name: Date of Service: Martin [name]. 04/07/2020 9:00 A M Medical Record Number: [id number] Patient Account Number: [id number] Date of Birth/Sex: Treating RN: 1945-10-21 (74 y.o. Martin [name] Primary Care Oberon Hehir: [name] Other Clinician: [name] Referring Crue Otero: Treating Zanaria Morell/Extender: [name] in Treatment: 15 Encounter Discharge Information Items Discharge Condition: Stable Ambulatory Status:  Cane Discharge Destination: Home Transportation: Private Auto Accompanied By: self Schedule Follow-up Appointment: Yes Clinical Summary of Care: Patient Declined Electronic Signature(s) Signed: 04/07/2020 3:25:04 PM By: [name] EMT/HBOT/SD Entered By: [name] on 04/07/2020 13:13:03 -------------------------------------------------------------------------------- Patient/Caregiver Education Details Patient Name: Date of Service: Martin [name] 12/10/2021andnbsp9:00 [name] Record Number: [id number] Patient Account Number: [id number] Date of Birth/Gender: Treating RN: 1945-05-02 (74 y.o. Martin [name] Primary Care Physician: [name] Other Clinician: [name] Referring Physician: Treating Physician/Extender: [name] in Treatment: 15 Education Assessment Education Provided To: Patient Education Topics Provided Hyperbaric Oxygenation: Methods: Explain/Verbal Responses: State content correctly Electronic Signature(s) Signed: 04/07/2020 3:25:04 PM By: [name] EMT/HBOT/SD Entered By: [name] on 04/07/2020 13:12:50 -------------------------------------------------------------------------------- Vitals Details Patient Name: Date of Service: Martin [name]. 04/07/2020 9:00 A M Medical Record Number: [id number] Patient Account Number: [id number] Date of Birth/Sex: Treating RN: 05/10/1945 (74 y.o. Martin [name] Primary Care Montina Dorrance: [name] Other Clinician: [name] Referring Asharia Lotter: Treating Aubree Doody/Extender: [name] in Treatment: 15 Vital Signs Time Taken: 09:15 Temperature (F): 98.4 Height (in): 72 Pulse (bpm): 66 Weight (lbs): 270 Respiratory Rate (breaths/min): 15 Body Mass Index (BMI): 36.6 Blood Pressure (mmHg): 172/80 Capillary Blood Glucose (mg/dl): 149 Reference Range: 80 - 120 mg / dl Electronic Signature(s) Signed: 04/07/2020  3:25:04 PM By: [name] EMT/HBOT/SD Entered By: [name] on 04/07/2020 10:41:25

## 2020-04-07 NOTE — Progress Notes (Addendum)
JAE, SKEET ([id number]) Visit Report for 04/07/2020 HBO Details Patient Name: Date of Service: Martin [name], Martin [name] 04/07/2020 9:00 A M Medical Record Number: [id number] Patient Account Number: [id number] Date of Birth/Sex: Treating RN: [birth year] (74 y.o. Martin [name] Primary Care Cherish Runde: [name] Other Clinician: [name] Referring Phares Zaccone: Treating Sekai Gitlin/Extender: [name] in Treatment: 15 HBO Treatment Course Details Treatment Course Number: 1 Ordering Yuko Coventry: Worthy Keeler T Treatments Ordered: otal 40 HBO Treatment Start Date: 02/22/2020 HBO Indication: Diabetic Ulcer(s) of the Lower Extremity HBO Treatment Details Treatment Number: 26 Patient Type: Outpatient Chamber Type: Monoplace Chamber Serial #: [id number] Treatment Protocol: 2.0 ATA with 90 minutes oxygen, and no air breaks Treatment Details Compression Rate Down: 2.0 psi / minute De-Compression Rate Up: 2.0 psi / minute Air breaks and breathing Decompress Decompress Compress Tx Pressure Begins Reached periods Begins Ends (leave unused spaces blank) Chamber Pressure (ATA 1 2 ------2 1 ) Clock Time (24 hr) 09:25 09:33 - - - - - - 11:03 11:11 Treatment Length: 106 (minutes) Treatment Segments: 4 Vital Signs Capillary Blood Glucose Reference Range: 80 - 120 mg / dl HBO Diabetic Blood Glucose Intervention Range: <131 mg/dl or >249 mg/dl Time Vitals Blood Respiratory Capillary Blood Glucose Pulse Action Type: Pulse: Temperature: Taken: Pressure: Rate: Glucose (mg/dl): Meter #: Oximetry (%) Taken: Pre 09:15 172/80 66 15 98.4 149 Post 11:13 171/78 72 16 98 149 Treatment Response Treatment Toleration: Well Treatment Completion Status: Treatment Completed without Adverse Event Additional Procedure Documentation Tissue Sevierity: Necrosis of bone Katlen Seyer Notes No concerns with treatment given Physician HBO Attestation: I certify that I  supervised this HBO treatment in accordance with Medicare guidelines. A trained emergency response team is readily available per Yes hospital policies and procedures. Continue HBOT as ordered. Yes Electronic Signature(s) Signed: 04/13/2020 8:09:40 AM By: Linton Ham MD Previous Signature: 04/07/2020 3:25:04 PM Version By: [name] EMT/HBOT/SD Previous Signature: 04/07/2020 3:25:04 PM Version By: [name] EMT/HBOT/SD Entered By: Linton Ham on 04/07/2020 16:54:06 -------------------------------------------------------------------------------- HBO Safety Checklist Details Patient Name: Date of Service: Martin Agee. 04/07/2020 9:00 A M Medical Record Number: [id number] Patient Account Number: [id number] Date of Birth/Sex: Treating RN: [birth year] (74 y.o. Martin [name] Primary Care Mateja Dier: [name] Other Clinician: [name] Referring Abir Eroh: Treating Samariya Rockhold/Extender: [name] in Treatment: 15 HBO Safety Checklist Items Safety Checklist Consent Form Signed Patient voided / foley secured and emptied When did you last eato n/a Last dose of injectable or oral agent insulin Ostomy pouch emptied and vented if applicable NA All implantable devices assessed, documented and approved NA Intravenous access site secured and place NA Valuables secured Linens and cotton and cotton/polyester blend (less than 51% polyester) Personal oil-based products / skin lotions / body lotions removed Wigs or hairpieces removed NA Smoking or tobacco materials removed NA Books / newspapers / magazines / loose paper removed Cologne, aftershave, perfume and deodorant removed Jewelry removed (may wrap wedding band) Make-up removed NA Hair care products removed Battery operated devices (external) removed NA Heating patches and chemical warmers removed NA Titanium eyewear removed NA Nail polish cured greater than 10  hours NA Casting material cured greater than 10 hours NA Hearing aids removed NA Loose dentures or partials removed NA Prosthetics have been removed NA Patient demonstrates correct use of air break device (if applicable) Patient concerns have been addressed Patient grounding bracelet on and cord attached to chamber Specifics for Inpatients (complete in addition to above) Medication sheet sent  with patient Intravenous medications needed or due during therapy sent with patient Drainage tubes (e.g. nasogastric tube or chest tube secured and vented) Endotracheal or Tracheotomy tube secured Cuff deflated of air and inflated with saline Airway suctioned Electronic Signature(s) Signed: 04/07/2020 10:41:58 AM By: [name] EMT/HBOT/SD Entered By: [name] on 04/07/2020 10:41:57

## 2020-04-10 ENCOUNTER — Encounter (HOSPITAL_BASED_OUTPATIENT_CLINIC_OR_DEPARTMENT_OTHER): Payer: PPO | Admitting: Internal Medicine

## 2020-04-10 ENCOUNTER — Other Ambulatory Visit: Payer: Self-pay

## 2020-04-10 DIAGNOSIS — L97514 Non-pressure chronic ulcer of other part of right foot with necrosis of bone: Secondary | ICD-10-CM | POA: Diagnosis not present

## 2020-04-10 DIAGNOSIS — E11621 Type 2 diabetes mellitus with foot ulcer: Secondary | ICD-10-CM | POA: Diagnosis not present

## 2020-04-10 LAB — GLUCOSE, CAPILLARY
Glucose-Capillary: 187 mg/dL — ABNORMAL HIGH (ref 70–99)
Glucose-Capillary: 168 mg/dL — ABNORMAL HIGH (ref 70–99)

## 2020-04-11 ENCOUNTER — Encounter (HOSPITAL_BASED_OUTPATIENT_CLINIC_OR_DEPARTMENT_OTHER): Payer: PPO | Admitting: Internal Medicine

## 2020-04-11 NOTE — Progress Notes (Signed)
Martin [name], Martin [name] ([id number]) Visit Report for 04/10/2020 Arrival Information Details Patient Name: Date of Service: Martin [name], Martin [name] 04/10/2020 8:00 A M Medical Record Number: [id number] Patient Account Number: [id number] Date of Birth/Sex: Treating RN: Nov 21, 1945 (74 y.o. Martin [name] Primary Care Agnieszka Newhouse: [name] Other Clinician: Referring Aeriana Speece: Treating Tannia Contino/Extender: [name] in Treatment: 15 Visit Information History Since Last Visit Added or deleted any medications: No Patient Arrived: Martin [name] Any new allergies or adverse reactions: No Arrival Time: 07:55 Had a fall or experienced change in No Accompanied By: self activities of daily living that may affect Transfer Assistance: None risk of falls: Patient Identification Verified: Yes Signs or symptoms of abuse/neglect since last visito No Secondary Verification Process Completed: Yes Hospitalized since last visit: No Patient Requires Transmission-Based Precautions: No Implantable device outside of the clinic excluding No Patient Has Alerts: No cellular tissue based products placed in the center since last visit: Has Dressing in Place as Prescribed: Yes Has Footwear/Offloading in Place as Prescribed: Yes Left: T Contact Cast otal Pain Present Now: No Electronic Signature(s) Signed: 04/11/2020 4:43:44 PM By: Deon Pilling Entered By: Deon Pilling on 04/10/2020 10:37:02 -------------------------------------------------------------------------------- Encounter Discharge Information Details Patient Name: Date of Service: Martin Agee. 04/10/2020 8:00 A M Medical Record Number: [id number] Patient Account Number: [id number] Date of Birth/Sex: Treating RN: 05-Oct-1945 (74 y.o. Martin [name] Primary Care Jaymason Ledesma: [name] Other Clinician: Referring Yeva Bissette: Treating Dalbert Stillings/Extender: [name] in Treatment: 15 Encounter Discharge  Information Items Discharge Condition: Stable Ambulatory Status: Cane Discharge Destination: Home Transportation: Private Auto Accompanied By: self Schedule Follow-up Appointment: Yes Clinical Summary of Care: Electronic Signature(s) Signed: 04/11/2020 4:43:44 PM By: Deon Pilling Entered By: Deon Pilling on 04/10/2020 10:40:11 -------------------------------------------------------------------------------- Patient/Caregiver Education Details Patient Name: Date of Service: Martin [name], [name] 12/13/2021andnbsp8:00 A M Medical Record Number: [id number] Patient Account Number: [id number] Date of Birth/Gender: Treating RN: May 19, 1945 (74 y.o. Martin [name] Primary Care Physician: [name] Other Clinician: Referring Physician: Treating Physician/Extender: [name] in Treatment: 15 Education Assessment Education Provided To: Patient Education Topics Provided Electronic Signature(s) Signed: 04/11/2020 4:43:44 PM By: Deon Pilling Entered By: Deon Pilling on 04/10/2020 10:40:02 -------------------------------------------------------------------------------- Vitals Details Patient Name: Date of Service: Martin Agee. 04/10/2020 8:00 A M Medical Record Number: [id number] Patient Account Number: [id number] Date of Birth/Sex: Treating RN: 10-08-1945 (74 y.o. Martin [name] Primary Care Zhara Gieske: [name] Other Clinician: Referring Nickalaus Crooke: Treating Eirene Rather/Extender: [name] in Treatment: 15 Vital Signs Time Taken: 07:55 Temperature (F): 98 Height (in): 72 Pulse (bpm): 79 Weight (lbs): 270 Respiratory Rate (breaths/min): 20 Body Mass Index (BMI): 36.6 Blood Pressure (mmHg): 173/82 Capillary Blood Glucose (mg/dl): 187 Reference Range: 80 - 120 mg / dl Electronic Signature(s) Signed: 04/11/2020 4:43:44 PM By: Deon Pilling Entered By: Deon Pilling on 04/10/2020 10:37:16

## 2020-04-12 ENCOUNTER — Encounter (HOSPITAL_BASED_OUTPATIENT_CLINIC_OR_DEPARTMENT_OTHER): Payer: PPO | Admitting: Physician Assistant

## 2020-04-12 ENCOUNTER — Other Ambulatory Visit (HOSPITAL_COMMUNITY)
Admission: RE | Admit: 2020-04-12 | Discharge: 2020-04-12 | Disposition: A | Discharge status: Home / Self Care | Payer: PPO | Source: Other Acute Inpatient Hospital | Attending: Physician Assistant | Admitting: Physician Assistant

## 2020-04-12 ENCOUNTER — Other Ambulatory Visit: Payer: Self-pay

## 2020-04-12 DIAGNOSIS — L97429 Non-pressure chronic ulcer of left heel and midfoot with unspecified severity: Secondary | ICD-10-CM | POA: Insufficient documentation

## 2020-04-12 DIAGNOSIS — L97514 Non-pressure chronic ulcer of other part of right foot with necrosis of bone: Secondary | ICD-10-CM | POA: Diagnosis not present

## 2020-04-12 DIAGNOSIS — E11621 Type 2 diabetes mellitus with foot ulcer: Secondary | ICD-10-CM | POA: Diagnosis not present

## 2020-04-12 DIAGNOSIS — L089 Local infection of the skin and subcutaneous tissue, unspecified: Secondary | ICD-10-CM | POA: Insufficient documentation

## 2020-04-12 DIAGNOSIS — L97522 Non-pressure chronic ulcer of other part of left foot with fat layer exposed: Secondary | ICD-10-CM | POA: Diagnosis not present

## 2020-04-12 LAB — GLUCOSE, CAPILLARY
Glucose-Capillary: 205 mg/dL — ABNORMAL HIGH (ref 70–99)
Glucose-Capillary: 198 mg/dL — ABNORMAL HIGH (ref 70–99)

## 2020-04-12 NOTE — Progress Notes (Addendum)
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 of tendon here to be honest. With that being said I do think it needs  to be removed as I feel like it is hindering his healing possibilities. 11/29; acute visit; I was asked to see the patient urgently today because of complaints of pain in his foot. He is a wound in the right fifth metatarsal head plantar aspect with underlying osteomyelitis. He has been undergoing hyperbaric oxygen treatment completing antibiotics in the middle of this month. He said the pain had increased to the point that it was becoming difficult for him to walk on his foot. He has not been systemically unwell.. He had a fairly significant debridement on his last visit 5 days ago. 03/29/2020 on evaluation today patient appears to be doing much better than he was on Monday according to the pictures and what I saw. Fortunately there is no signs of active infection at this time which is great news. There is no evidence of systemic infection either. Overall I feel like the patient is doing indeed much better and is just a matter of having him continue the antibiotic for the time being in my opinion and then depending on the results of the culture we may extend that for a bit longer. 12/10; I am seeing the patient today because of  difficulty had this week with his wife's illness. He could not come on Wednesday. The wound looks a lot better than what I usually am used to seeing. He is using silver alginate and a total contact cast continuing with HBO. He has completed his antibiotics cultures and x-rays were negative 04/12/2020 upon evaluation today patient unfortunately appears to be doing a little worse in regard to his wound from the standpoint of erythema surrounding. There does not appear to be any signs of active systemic infection which is great news. Nonetheless I am concerned about the fact that if we put him back in the cast he will not be able to visually see what was going on in regard to his foot and if anything is worsening he would have no idea into it could be potentially too  late. Objective Constitutional Well-nourished and well-hydrated in no acute distress. Vitals Time Taken: 10:41 AM, Height: 72 in, Weight: 270 lbs, BMI: 36.6, Temperature: 97.7 F, Pulse: 95 bpm, Respiratory Rate: 20 breaths/min, Blood Pressure: 186/75 mmHg, Capillary Blood Glucose: 205 mg/dl. Respiratory normal breathing without difficulty. Psychiatric this patient is able to make decisions and demonstrates good insight into disease process. Alert and Oriented x 3. pleasant and cooperative. General Notes: Upon inspection patient's wound bed actually showed signs of some evidence of healing but overall there is some erythema as well that has me concerned the spread to the dorsal surface of his foot as well obviously there is cellulitis. For this reason I would avoid the cast today we will probably just put him in a offloading shoe and see how things do over the next week. Integumentary (Hair, Skin) Wound #5 status is Open. Original cause of wound was Gradually Appeared. The wound is located on the Left Metatarsal head fifth. The wound measures 0.8cm length x 0.7cm width x 0.4cm depth; 0.44cm^2 area and 0.176cm^3 volume. There is Fat Layer (Subcutaneous Tissue) exposed. There is no tunneling noted, however, there is undermining starting at 4:00 and ending at 12:00 with a maximum distance of 1.1cm. There is a medium amount of serous drainage noted. The wound margin is well defined and not attached to the wound base. There is large (67-100%) pink granulation within the wound bed. There is a small (1- 33%) amount of necrotic tissue within the wound bed including Adherent Slough. 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 - Wed. with Glynn Octave Shower/ Hygiene: May shower with protection but do not get wound dressing(s) wet. 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 Follow Hyperbaric Oxygen Glycemia Protocol Afrin (Oxymetazoline HCL) 0.05% nasal spray - 1 spray in both nostrils daily as needed prior to HBO treatment for difficulty clearing ears The following medication(s) was prescribed: Bactrim DS oral 800 mg-160 mg tablet 1 1 tablet oral taken 2 times per day for 14 days starting 04/12/2020 WOUND #5: -  Metatarsal head fifth Wound Laterality: Left Prim Dressing: KerraCel Ag Gelling Fiber Dressing, 2x2 in (silver alginate) Every Other Day/ ary Discharge Instructions: Apply silver alginate to wound bed , pack lightly into undermining Secondary Dressing: Woven Gauze Sponges 2x2 in Every Other Day/ Discharge Instructions: Apply over primary dressing as directed. Secondary Dressing: Optifoam Non-Adhesive Dressing, 4x4 in Every Other Day/ Discharge Instructions: Apply over primary dressing cut to make foam donut Secured With: 80M Medipore H Soft Cloth Surgical T 4 x 2 (in/yd) Every Other Day/ ape Discharge Instructions: Secure dressing with tape as directed. 1. Would recommend currently that we hold the cast for the week and see how things do. The patient is in agreement with that plan. We will subsequently see where he stands in 1 week's time. If he is doing better we may reinitiate the cast at that point. 2. I am  also can recommend at this time that we have the patient continue with the silver alginate dressing I think this is still the best way to go. 3. I would also recommend a doughnut foam dressing to cover this in order to offload and keep pressure out of the area. 4. I would also recommend the patient continue to try to as much as possible limit his walking. With that being said I know there is some unavoidable here. 5. I am getting go ahead and represcribe the Bactrim DS form for the next 2 weeks. With that being said depending on what the results of the culture show I may need to make adjustments in the therapy going forward. We will see patient back for reevaluation in 1 week here in the clinic. If anything worsens or changes patient will contact our office for additional recommendations. Electronic Signature(s) Signed: 04/12/2020 11:31:59 AM By: Worthy Keeler PA-C Entered By: Worthy Keeler on 04/12/2020 11:31:58 -------------------------------------------------------------------------------- SuperBill Details Patient Name: Date of Service: KRISHAN, MCBREEN 04/12/2020 Medical Record Number: [id number] Patient Account Number: [id number] Date of Birth/Sex: Treating RN: November 06, 1945 (75 y.o. Martin [name] Primary Care Provider: [name] Other Clinician: Referring Provider: Treating Provider/Extender: Sandria Bales in Treatment: 16 Diagnosis Coding ICD-10 Codes Code Description E11.621 Type 2 diabetes mellitus with foot ulcer L97.522 Non-pressure chronic ulcer of other part of left foot with fat layer exposed I10 Essential (primary) hypertension I25.10 Atherosclerotic heart disease of native coronary artery without angina pectoris M86.272 Subacute osteomyelitis, left ankle and foot Physician Procedures : CPT4 Code Description Modifier 6195093 [id number] - WC PHYS LEVEL 3 - EST PT ICD-10 Diagnosis Description E11.621 Type 2 diabetes mellitus with foot ulcer  L97.522 Non-pressure chronic ulcer of other part of left foot with fat layer exposed I10 Essential  (primary) hypertension I25.10 Atherosclerotic heart disease of native coronary artery without angina pectoris Quantity: 1 Electronic Signature(s) Signed: 04/12/2020 11:32:11 AM By: Worthy Keeler PA-C Entered By: Worthy Keeler on 04/12/2020 11:32:10

## 2020-04-12 NOTE — Progress Notes (Signed)
Martin [name], Martin [name] ([id number]) Visit Report for 04/12/2020 Problem List Details Patient Name: Date of Service: Martin [name], Martin [name] 04/12/2020 8:00 A M Medical Record Number: [id number] Patient Account Number: [id number] Date of Birth/Sex: Treating RN: February 02, 1946 (74 y.o. [name], [name] Primary Care Provider: [name] Other Clinician: Referring Provider: Treating Provider/Extender: Sandria Bales in Treatment: 16 Active Problems ICD-10 Encounter Code Description Active Date MDM Diagnosis E11.621 Type 2 diabetes mellitus with foot ulcer 12/22/2019 No Yes L97.522 Non-pressure chronic ulcer of other part of left foot with fat layer exposed 12/22/2019 No Yes I10 Essential (primary) hypertension 12/22/2019 No Yes I25.10 Atherosclerotic heart disease of native coronary artery without angina pectoris 12/22/2019 No Yes M86.272 Subacute osteomyelitis, left ankle and foot 02/10/2020 No Yes Inactive Problems Resolved Problems Electronic Signature(s) Signed: 04/12/2020 4:09:14 PM By: Worthy Keeler PA-C Entered By: Worthy Keeler on 04/12/2020 16:09:13 -------------------------------------------------------------------------------- SuperBill Details Patient Name: Date of Service: [name] 04/12/2020 Medical Record Number: [id number] Patient Account Number: [id number] Date of Birth/Sex: Treating RN: 08/02/1945 (74 y.o. [name] Primary Care Provider: [name] Other Clinician: Referring Provider: Treating Provider/Extender: Sandria Bales in Treatment: 16 Diagnosis Coding ICD-10 Codes Code Description E11.621 Type 2 diabetes mellitus with foot ulcer L97.522 Non-pressure chronic ulcer of other part of left foot with fat layer exposed I10 Essential (primary) hypertension I25.10 Atherosclerotic heart disease of native coronary artery without angina pectoris M86.272 Subacute osteomyelitis, left ankle and foot Facility  Procedures CPT4 Code: [id number] Description: G0277-(Facility Use Only) HBOT full body chamber, 59min , Modifier: Quantity: 4 Physician Procedures : CPT4 Code Description Modifier [id number] 16010 - WC PHYS HYPERBARIC OXYGEN THERAPY ICD-10 Diagnosis Description E11.621 Type 2 diabetes mellitus with foot ulcer Quantity: 1 Electronic Signature(s) Signed: 04/12/2020 4:09:09 PM By: Worthy Keeler PA-C Previous Signature: 04/12/2020 1:05:14 PM Version By: Worthy Keeler PA-C Entered By: Worthy Keeler on 04/12/2020 16:09:08

## 2020-04-12 NOTE — Progress Notes (Signed)
Martin [name], Martin [name] ([id number]) Visit Report for 04/12/2020 Arrival Information Details Patient Name: Date of Service: Martin [name], Martin [name] 04/12/2020 8:00 A M Medical Record Number: [id number] Patient Account Number: [id number] Date of Birth/Sex: Treating RN: 1946/01/29 (74 y.o. Martin [name], Martin [name] Primary Care Tiare Rohlman: [name] Other Clinician: Mikeal [name] Referring Kayren Holck: Treating Brevon Dewald/Extender: Sandria Bales in Treatment: 16 Visit Information History Since Last Visit Added or deleted any medications: No Patient Arrived: Martin [name] Any new allergies or adverse reactions: No Arrival Time: 08:05 Had a fall or experienced change in No Accompanied By: self activities of daily living that may affect Transfer Assistance: None risk of falls: Patient Identification Verified: Yes Signs or symptoms of abuse/neglect since last visito No Secondary Verification Process Completed: Yes Hospitalized since last visit: No Patient Requires Transmission-Based Precautions: No Implantable device outside of the clinic excluding No Patient Has Alerts: No cellular tissue based products placed in the center since last visit: Pain Present Now: No Electronic Signature(s) Signed: 04/12/2020 4:41:35 PM By: Martin [name] EMT/HBOT/SD Entered By: Martin [name] on 04/12/2020 08:20:18 -------------------------------------------------------------------------------- Encounter Discharge Information Details Patient Name: Date of Service: Martin Agee. 04/12/2020 8:00 A M Medical Record Number: [id number] Patient Account Number: [id number] Date of Birth/Sex: Treating RN: 1945-12-03 (74 y.o. Martin [name] Primary Care Karyss Frese: [name] Other Clinician: Mikeal [name] Referring Cydne Grahn: Treating Alando Colleran/Extender: Sandria Bales in Treatment: 16 Encounter Discharge Information Items Discharge Condition: Stable Ambulatory Status:  Cane Discharge Destination: Home Transportation: Private Auto Accompanied By: self Schedule Follow-up Appointment: Yes Clinical Summary of Care: Patient Declined Electronic Signature(s) Signed: 04/12/2020 4:41:35 PM By: Martin [name] EMT/HBOT/SD Entered By: Martin [name] on 04/12/2020 11:07:57 -------------------------------------------------------------------------------- Patient/Caregiver Education Details Patient Name: Date of Service: Martin [name], [name] 12/15/2021andnbsp8:00 [place] Record Number: [id number] Patient Account Number: [id number] Date of Birth/Gender: Treating RN: Aug 01, 1945 (74 y.o. Martin [name] Primary Care Physician: [name] Other Clinician: Mikeal [name] Referring Physician: Treating Physician/Extender: Sandria Bales in Treatment: 16 Education Assessment Education Provided To: Patient Education Topics Provided Hyperbaric Oxygenation: Methods: Explain/Verbal Responses: State content correctly Electronic Signature(s) Signed: 04/12/2020 4:41:35 PM By: Martin [name] EMT/HBOT/SD Entered By: Martin [name] on 04/12/2020 11:07:42 -------------------------------------------------------------------------------- Vitals Details Patient Name: Date of Service: Martin Agee. 04/12/2020 8:00 A M Medical Record Number: [id number] Patient Account Number: [id number] Date of Birth/Sex: Treating RN: 1945-09-29 (74 y.o. Martin [name] Primary Care Sameria Morss: [name] Other Clinician: Mikeal [name] Referring Naheem Mosco: Treating Shawndale Kilpatrick/Extender: Sandria Bales in Treatment: 16 Vital Signs Time Taken: 08:10 Temperature (F): 97.7 Height (in): 72 Pulse (bpm): 95 Weight (lbs): 270 Respiratory Rate (breaths/min): 20 Body Mass Index (BMI): 36.6 Blood Pressure (mmHg): 186/75 Capillary Blood Glucose (mg/dl): 205 Reference Range: 80 - 120 mg / dl Electronic Signature(s) Signed: 04/12/2020  4:41:35 PM By: Martin [name] EMT/HBOT/SD Entered By: Martin [name] on 04/12/2020 08:20:34

## 2020-04-12 NOTE — Progress Notes (Addendum)
JULIOCESAR, BLASIUS ([id number]) Visit Report for 04/12/2020 HBO Details Patient Name: Date of Service: Martin [name], Martin [name] 04/12/2020 8:00 A M Medical Record Number: [id number] Patient Account Number: [id number] Date of Birth/Sex: Treating RN: Dec 29, 1945 (74 y.o. [name] Primary Care Temari Schooler: [name] Other Clinician: [name] Referring Onyx Schirmer: Treating Sabriya Yono/Extender: Sandria Bales in Treatment: 16 HBO Treatment Course Details Treatment Course Number: 1 Ordering Summer Parthasarathy: Worthy Keeler T Treatments Ordered: otal 40 HBO Treatment Start Date: 02/22/2020 HBO Indication: Diabetic Ulcer(s) of the Lower Extremity HBO Treatment Details Treatment Number: 28 Patient Type: Outpatient Chamber Type: Monoplace Chamber Serial #: [id number] Treatment Protocol: 2.0 ATA with 90 minutes oxygen, and no air breaks Treatment Details Compression Rate Down: 2.0 psi / minute De-Compression Rate Up: 2.0 psi / minute Air breaks and breathing Decompress Decompress Compress Tx Pressure Begins Reached periods Begins Ends (leave unused spaces blank) Chamber Pressure (ATA 1 2 ------2 1 ) Clock Time (24 hr) 08:24 08:32 - - - - - - 10:02 10:10 Treatment Length: 106 (minutes) Treatment Segments: 4 Vital Signs Capillary Blood Glucose Reference Range: 80 - 120 mg / dl HBO Diabetic Blood Glucose Intervention Range: <131 mg/dl or >249 mg/dl Time Vitals Blood Respiratory Capillary Blood Glucose Pulse Action Type: Pulse: Temperature: Taken: Pressure: Rate: Glucose (mg/dl): Meter #: Oximetry (%) Taken: Pre 08:10 186/75 95 20 97.7 205 Post 10:12 164/72 86 15 98.1 198 Treatment Response Treatment Toleration: Well Treatment Completion Status: Treatment Completed without Adverse Event Additional Procedure Documentation Tissue Sevierity: Necrosis of bone Physician HBO Attestation: I certify that I supervised this HBO treatment in accordance with  Medicare guidelines. A trained emergency response team is readily available per Yes hospital policies and procedures. Continue HBOT as ordered. Yes Electronic Signature(s) Signed: 04/12/2020 4:09:02 PM By: Worthy Keeler PA-C Previous Signature: 04/12/2020 1:05:14 PM Version By: Worthy Keeler PA-C Entered By: Worthy Keeler on 04/12/2020 16:09:01 -------------------------------------------------------------------------------- HBO Safety Checklist Details Patient Name: Date of Service: Martin [name], Martin [name] 04/12/2020 8:00 A M Medical Record Number: [id number] Patient Account Number: [id number] Date of Birth/Sex: Treating RN: Dec 16, 1945 (74 y.o. [name] Primary Care Ronald Vinsant: [name] Other Clinician: [name] Referring Valarie Farace: Treating Pearlene Teat/Extender: Sandria Bales in Treatment: 16 HBO Safety Checklist Items Safety Checklist Consent Form Signed Patient voided / foley secured and emptied When did you last eato 0700 - cereal Last dose of injectable or oral agent insulin Ostomy pouch emptied and vented if applicable NA All implantable devices assessed, documented and approved NA Intravenous access site secured and place NA Valuables secured Linens and cotton and cotton/polyester blend (less than 51% polyester) Personal oil-based products / skin lotions / body lotions removed Wigs or hairpieces removed NA Smoking or tobacco materials removed NA Books / newspapers / magazines / loose paper removed Cologne, aftershave, perfume and deodorant removed Jewelry removed (may wrap wedding band) Make-up removed NA Hair care products removed Battery operated devices (external) removed NA Heating patches and chemical warmers removed NA Titanium eyewear removed NA Nail polish cured greater than 10 hours NA Casting material cured greater than 10 hours Hearing aids removed NA Loose dentures or partials removed NA Prosthetics  have been removed NA Patient demonstrates correct use of air break device (if applicable) Patient concerns have been addressed Patient grounding bracelet on and cord attached to chamber Specifics for Inpatients (complete in addition to above) Medication sheet sent with patient Intravenous medications needed or due during therapy sent with  patient Drainage tubes (e.g. nasogastric tube or chest tube secured and vented) Endotracheal or Tracheotomy tube secured Cuff deflated of air and inflated with saline Airway suctioned Electronic Signature(s) Signed: 04/12/2020 8:21:23 AM By: [name] EMT/HBOT/SD Entered By: [name] on 04/12/2020 64:68:03

## 2020-04-12 NOTE — Progress Notes (Signed)
HOA, DERISO ([id number]) Visit Report for 04/12/2020 Arrival Information Details Patient Name: Date of Service: Martin [name] 04/12/2020 10:30 A M Medical Record Number: [id number] Patient Account Number: [id number] Date of Birth/Sex: Treating RN: 01-26-1946 (74 y.o. Martin [name], [name] Primary Care Martin [name]: [name] Other Clinician: Referring Martin [name]: Treating Martin [name]/Extender: Sandria Bales in Treatment: 16 Visit Information History Since Last Visit Added or deleted any medications: No Patient Arrived: Ambulatory Any new allergies or adverse reactions: No Arrival Time: 10:40 Had a fall or experienced change in No Accompanied By: self activities of daily living that may affect Transfer Assistance: None risk of falls: Patient Identification Verified: Yes Signs or symptoms of abuse/neglect since last visito No Secondary Verification Process Completed: Yes Hospitalized since last visit: No Patient Requires Transmission-Based Precautions: No Implantable device outside of the clinic excluding No Patient Has Alerts: No cellular tissue based products placed in the center since last visit: Has Dressing in Place as Prescribed: Yes Pain Present Now: No Electronic Signature(s) Signed: 04/12/2020 5:01:40 PM By: Rhae Hammock RN Entered By: Rhae Hammock on 04/12/2020 10:41:38 -------------------------------------------------------------------------------- Clinic Level of Care Assessment Details Patient Name: Date of Service: Martin [name], Martin [name] 04/12/2020 10:30 A M Medical Record Number: [id number] Patient Account Number: [id number] Date of Birth/Sex: Treating RN: 10-19-1945 (74 y.o. Martin [name] Primary Care Martin [name]: [name] Other Clinician: Referring Martin [name]: Treating Martin [name]/Extender: Sandria Bales in Treatment: 16 Clinic Level of Care Assessment Items TOOL 4 Quantity Score []  - 0 Use when  only an EandM is performed on FOLLOW-UP visit ASSESSMENTS - Nursing Assessment / Reassessment X- 1 10 Reassessment of Co-morbidities (includes updates in patient status) X- 1 5 Reassessment of Adherence to Treatment Plan ASSESSMENTS - Wound and Skin A ssessment / Reassessment X - Simple Wound Assessment / Reassessment - one wound 1 5 []  - 0 Complex Wound Assessment / Reassessment - multiple wounds []  - 0 Dermatologic / Skin Assessment (not related to wound area) ASSESSMENTS - Focused Assessment X- 1 5 Circumferential Edema Measurements - multi extremities []  - 0 Nutritional Assessment / Counseling / Intervention X- 1 5 Lower Extremity Assessment (monofilament, tuning fork, pulses) []  - 0 Peripheral Arterial Disease Assessment (using hand held doppler) ASSESSMENTS - Ostomy and/or Continence Assessment and Care []  - 0 Incontinence Assessment and Management []  - 0 Ostomy Care Assessment and Management (repouching, etc.) PROCESS - Coordination of Care X - Simple Patient / Family Education for ongoing care 1 15 []  - 0 Complex (extensive) Patient / Family Education for ongoing care X- 1 10 Staff obtains Consents, Records, T Results / Process Orders est []  - 0 Staff telephones HHA, Nursing Homes / Clarify orders / etc []  - 0 Routine Transfer to another Facility (non-emergent condition) []  - 0 Routine Hospital Admission (non-emergent condition) []  - 0 New Admissions / Biomedical engineer / Ordering NPWT Apligraf, etc. , []  - 0 Emergency Hospital Admission (emergent condition) X- 1 10 Simple Discharge Coordination []  - 0 Complex (extensive) Discharge Coordination PROCESS - Special Needs []  - 0 Pediatric / Minor Patient Management []  - 0 Isolation Patient Management []  - 0 Hearing / Language / Visual special needs []  - 0 Assessment of Community assistance (transportation, D/C planning, etc.) []  - 0 Additional assistance / Altered mentation []  - 0 Support  Surface(s) Assessment (bed, cushion, seat, etc.) INTERVENTIONS - Wound Cleansing / Measurement X - Simple Wound Cleansing - one wound 1 5 []  - 0 Complex Wound Cleansing - multiple  wounds X- 1 5 Wound Imaging (photographs - any number of wounds) []  - 0 Wound Tracing (instead of photographs) X- 1 5 Simple Wound Measurement - one wound []  - 0 Complex Wound Measurement - multiple wounds INTERVENTIONS - Wound Dressings X - Small Wound Dressing one or multiple wounds 1 10 []  - 0 Medium Wound Dressing one or multiple wounds []  - 0 Large Wound Dressing one or multiple wounds X- 1 5 Application of Medications - topical []  - 0 Application of Medications - injection INTERVENTIONS - Miscellaneous []  - 0 External ear exam []  - 0 Specimen Collection (cultures, biopsies, blood, body fluids, etc.) []  - 0 Specimen(s) / Culture(s) sent or taken to Lab for analysis []  - 0 Patient Transfer (multiple staff / Civil Service fast streamer / Similar devices) []  - 0 Simple Staple / Suture removal (25 or less) []  - 0 Complex Staple / Suture removal (26 or more) []  - 0 Hypo / Hyperglycemic Management (close monitor of Blood Glucose) []  - 0 Ankle / Brachial Index (ABI) - do not check if billed separately X- 1 5 Vital Signs Has the patient been seen at the hospital within the last three years: Yes Total Score: 100 Level Of Care: New/Established - Level 3 Electronic Signature(s) Signed: 04/12/2020 5:29:15 PM By: Baruch Gouty RN, BSN Entered By: Baruch Gouty on 04/12/2020 11:29:42 -------------------------------------------------------------------------------- Encounter Discharge Information Details Patient Name: Date of Service: Martin [name]. 04/12/2020 10:30 A M Medical Record Number: [id number] Patient Account Number: [id number] Date of Birth/Sex: Treating RN: 1945/05/13 (74 y.o. Martin [name] Primary Care Martin [name]: [name] Other Clinician: Referring Martin [name]: Treating Martin [name]/Extender:  Sandria Bales in Treatment: 16 Encounter Discharge Information Items Discharge Condition: Stable Ambulatory Status: Cane Discharge Destination: Home Transportation: Private Auto Accompanied By: self Schedule Follow-up Appointment: Yes Clinical Summary of Care: Electronic Signature(s) Signed: 04/12/2020 5:09:50 PM By: Deon Pilling Entered By: Deon Pilling on 04/12/2020 11:37:17 -------------------------------------------------------------------------------- Lower Extremity Assessment Details Patient Name: Date of Service: MYKA, LUKINS 04/12/2020 10:30 A M Medical Record Number: [id number] Patient Account Number: [id number] Date of Birth/Sex: Treating RN: 11-Jul-1945 (74 y.o. Martin [name], [name] Primary Care Janette Harvie: [name] Other Clinician: Referring Hernandez Losasso: Treating Milca Sytsma/Extender: Sandria Bales in Treatment: 16 Edema Assessment Assessed: [name]: Yes] [name]: No] Edema: [Left: Ye] [Right: s] Calf Left: Right: Point of Measurement: 32 cm From Medial Instep 39 cm Ankle Left: Right: Point of Measurement: 11 cm From Medial Instep 29 cm Vascular Assessment Pulses: Dorsalis Pedis Palpable: [Left:Yes] Posterior Tibial Palpable: [Left:Yes] Electronic Signature(s) Signed: 04/12/2020 5:01:40 PM By: Rhae Hammock RN Entered By: Rhae Hammock on 04/12/2020 10:51:26 -------------------------------------------------------------------------------- Multi-Disciplinary Care Plan Details Patient Name: Date of Service: Martin [name]. 04/12/2020 10:30 A M Medical Record Number: [id number] Patient Account Number: [id number] Date of Birth/Sex: Treating RN: 01-20-1946 (74 y.o. Martin [name] Primary Care Jshaun Abernathy: [name] Other Clinician: Referring Charnita Trudel: Treating Burlene Montecalvo/Extender: Sandria Bales in Treatment: 16 Active Inactive Nutrition Nursing Diagnoses: Impaired  glucose control: actual or potential Potential for alteratiion in Nutrition/Potential for imbalanced nutrition Goals: Patient/caregiver will maintain therapeutic glucose control Date Initiated: 12/22/2019 Target Resolution Date: 04/19/2020 Goal Status: Active Interventions: Assess HgA1c results as ordered upon admission and as needed Treatment Activities: Patient referred to Primary Care Physician for further nutritional evaluation : 12/22/2019 Notes: Soft Tissue Infection Nursing Diagnoses: Impaired tissue integrity Potential for infection: soft tissue Goals: Patient's soft tissue infection will resolve Date Initiated: 03/29/2020 Target Resolution Date: 04/26/2020 Goal  Status: Active Interventions: Assess signs and symptoms of infection every visit Provide education on infection Treatment Activities: Culture and sensitivity : 03/29/2020 Education provided on Infection : 01/19/2020 Systemic antibiotics : 03/29/2020 T ordered outside of clinic : 03/29/2020 est Notes: Wound/Skin Impairment Nursing Diagnoses: Impaired tissue integrity Knowledge deficit related to ulceration/compromised skin integrity Goals: Patient/caregiver will verbalize understanding of skin care regimen Date Initiated: 12/22/2019 Target Resolution Date: 05/10/2020 Goal Status: Active Ulcer/skin breakdown will have a volume reduction of 30% by week 4 Date Initiated: 12/22/2019 Date Inactivated: 01/19/2020 Target Resolution Date: 01/19/2020 Goal Status: Unmet Unmet Reason: infection Ulcer/skin breakdown will have a volume reduction of 50% by week 8 Date Initiated: 01/19/2020 Date Inactivated: 02/16/2020 Target Resolution Date: 02/16/2020 Goal Status: Unmet Unmet Reason: infection Ulcer/skin breakdown will have a volume reduction of 80% by week 12 Date Initiated: 02/16/2020 Date Inactivated: 03/15/2020 Target Resolution Date: 03/15/2020 Goal Status: Unmet Unmet Reason: osteo, getting  HBO Interventions: Assess patient/caregiver ability to obtain necessary supplies Assess patient/caregiver ability to perform ulcer/skin care regimen upon admission and as needed Assess ulceration(s) every visit Provide education on ulcer and skin care Treatment Activities: Skin care regimen initiated : 12/22/2019 Topical wound management initiated : 12/22/2019 Notes: Electronic Signature(s) Signed: 04/12/2020 5:29:15 PM By: Baruch Gouty RN, BSN Entered By: Baruch Gouty on 04/12/2020 11:21:15 -------------------------------------------------------------------------------- Pain Assessment Details Patient Name: Date of Service: Martin [name]. 04/12/2020 10:30 A M Medical Record Number: [id number] Patient Account Number: [id number] Date of Birth/Sex: Treating RN: 1946/02/13 (74 y.o. Erie Noe Primary Care Mayela Bullard: [name] Other Clinician: Referring Evella Kasal: Treating Luwanna Brossman/Extender: Sandria Bales in Treatment: 16 Active Problems Location of Pain Severity and Description of Pain Patient Has Paino No Site Locations Rate the pain. Current Pain Level: 0 Pain Management and Medication Current Pain Management: Electronic Signature(s) Signed: 04/12/2020 5:01:40 PM By: Rhae Hammock RN Entered By: Rhae Hammock on 04/12/2020 10:42:10 -------------------------------------------------------------------------------- Patient/Caregiver Education Details Patient Name: Date of Service: Martin [name] 12/15/2021andnbsp10:30 A M Medical Record Number: [id number] Patient Account Number: [id number] Date of Birth/Gender: Treating RN: 08-24-1945 (74 y.o. Martin [name] Primary Care Physician: [name] Other Clinician: Referring Physician: Treating Physician/Extender: Sandria Bales in Treatment: 16 Education Assessment Education Provided To: Patient Education Topics  Provided Infection: Methods: Explain/Verbal Responses: Reinforcements needed, State content correctly Offloading: Methods: Explain/Verbal Responses: Reinforcements needed, State content correctly Wound/Skin Impairment: Methods: Explain/Verbal Responses: Reinforcements needed, State content correctly Electronic Signature(s) Signed: 04/12/2020 5:29:15 PM By: Baruch Gouty RN, BSN Entered By: Baruch Gouty on 04/12/2020 11:23:06 -------------------------------------------------------------------------------- Wound Assessment Details Patient Name: Date of Service: Martin [name]. 04/12/2020 10:30 A M Medical Record Number: [id number] Patient Account Number: [id number] Date of Birth/Sex: Treating RN: 03-30-1946 (74 y.o. 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 History: Diabetes, Neuropathy Clustered Wound: No Wound Measurements Length: (cm) 0.8 Width: (cm) 0.7 Depth: (cm) 0.4 Area: (cm) 0.44 Volume: (cm) 0.176 % Reduction in Area: 63.6% % Reduction in Volume: 79.2% Epithelialization: Medium (34-66%) Tunneling: No Undermining: Yes Starting Position (o'clock): 4 Ending Position (o'clock): 12 Maximum Distance: (cm) 1.1 Wound Description Classification: Grade 3 Wound Margin: Well defined, not attached Exudate Amount: Medium  Exudate Type: Serous Exudate Color: amber Foul Odor After Cleansing: No Slough/Fibrino Yes Wound Bed Granulation Amount: Large (67-100%) Exposed Structure Granulation Quality: Pink Fascia Exposed:  No Necrotic Amount: Small (1-33%) Fat Layer (Subcutaneous Tissue) Exposed: Yes Necrotic Quality: Adherent Slough Tendon Exposed: No Muscle Exposed: No Joint Exposed: No Bone Exposed: No Treatment Notes Wound #5 (Metatarsal head fifth) Wound Laterality: Left Cleanser Peri-Wound Care Topical Primary Dressing KerraCel Ag Gelling Fiber Dressing, 2x2 in (silver alginate) Discharge Instruction: Apply silver alginate to wound bed , pack lightly into undermining Secondary Dressing Woven Gauze Sponges 2x2 in Discharge Instruction: Apply over primary dressing as directed. Optifoam Non-Adhesive Dressing, 4x4 in Discharge Instruction: Apply over primary dressing cut to make foam donut Secured With [place] [facility] T 4 x 2 (in/yd) ape Discharge Instruction: Secure dressing with tape as directed. Compression Wrap Compression Stockings Add-Ons Electronic Signature(s) Signed: 04/12/2020 5:01:40 PM By: Rhae Hammock RN Entered By: Rhae Hammock on 04/12/2020 10:56:07 -------------------------------------------------------------------------------- Vitals Details Patient Name: Date of Service: Martin [name]. 04/12/2020 10:30 A M Medical Record Number: [id number] Patient Account Number: [id number] Date of Birth/Sex: Treating RN: 1946/01/15 (74 y.o. Martin [name], [name] Primary Care Claribel Sachs: [name] Other Clinician: Referring Alby Schwabe: Treating Cerissa Zeiger/Extender: Sandria Bales in Treatment: 16 Vital Signs Time Taken: 10:41 Temperature (F): 97.7 Height (in): 72 Pulse (bpm): 95 Weight (lbs): 270 Respiratory Rate (breaths/min): 20 Body Mass Index (BMI): 36.6 Blood Pressure (mmHg): 186/75 Capillary Blood Glucose (mg/dl): 205 Reference Range: 80 - 120 mg / dl Electronic Signature(s) Signed: 04/12/2020 5:01:40 PM By: Rhae Hammock RN Entered By: Rhae Hammock on 04/12/2020 10:42:01

## 2020-04-13 ENCOUNTER — Other Ambulatory Visit: Payer: Self-pay

## 2020-04-13 ENCOUNTER — Encounter (HOSPITAL_BASED_OUTPATIENT_CLINIC_OR_DEPARTMENT_OTHER): Payer: PPO | Admitting: Internal Medicine

## 2020-04-13 ENCOUNTER — Ambulatory Visit (HOSPITAL_COMMUNITY)
Admission: RE | Admit: 2020-04-13 | Discharge: 2020-04-13 | Disposition: A | Discharge status: Home / Self Care | Payer: PPO | Source: Ambulatory Visit | Attending: Internal Medicine | Admitting: Internal Medicine

## 2020-04-13 ENCOUNTER — Other Ambulatory Visit (HOSPITAL_COMMUNITY): Payer: Self-pay | Admitting: Internal Medicine

## 2020-04-13 DIAGNOSIS — E11621 Type 2 diabetes mellitus with foot ulcer: Secondary | ICD-10-CM | POA: Diagnosis not present

## 2020-04-13 DIAGNOSIS — L97514 Non-pressure chronic ulcer of other part of right foot with necrosis of bone: Secondary | ICD-10-CM | POA: Diagnosis not present

## 2020-04-13 DIAGNOSIS — L97522 Non-pressure chronic ulcer of other part of left foot with fat layer exposed: Secondary | ICD-10-CM

## 2020-04-13 DIAGNOSIS — L97521 Non-pressure chronic ulcer of other part of left foot limited to breakdown of skin: Secondary | ICD-10-CM | POA: Diagnosis not present

## 2020-04-13 LAB — GLUCOSE, CAPILLARY
Glucose-Capillary: 263 mg/dL — ABNORMAL HIGH (ref 70–99)
Glucose-Capillary: 248 mg/dL — ABNORMAL HIGH (ref 70–99)

## 2020-04-13 NOTE — Progress Notes (Signed)
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 is hypertensive.. Pulse regular and within target range for patient.Marland Kitchen Respirations regular, non-labored and within target range.. Temperature is normal and within the target range for the patient.Marland Kitchen Appears in no  distress. Vitals Time Taken: 8:10 AM, Height: 72 in, Weight: 270 lbs, BMI: 36.6, Temperature: 98.1 F, Pulse: 73 bpm, Respiratory Rate: 16 breaths/min, Blood Pressure: 186/89 mmHg, Capillary Blood Glucose: 134 mg/dl. General Notes: patient is stressed and concerned with wife's health at this time. Will recheck BP manually at discharge. Cardiovascular Pedal pulses are palpable. General Notes: Wound exam; this is on the left fifth metatarsal head. Small orifice better than what I have seen. He has undermining from about 4-10 o'clock at a centimeter no palpable bone. No purulent drainage. Integumentary (Hair, Skin) Wound #5 status is Open. Original cause of wound was Gradually Appeared. The wound is located on the Left Metatarsal head fifth. The wound measures 0.5cm length x 0.4cm width x 0.4cm depth; 0.157cm^2 area and 0.063cm^3 volume. There is Fat Layer (Subcutaneous Tissue) exposed. There is no tunneling noted, however, there is undermining starting at 4:00 and ending at 10:00 with a maximum distance of 1cm. There is a medium amount of serous drainage noted. The wound margin  is well defined and not attached to the wound base. There is large (67-100%) pink granulation within the wound bed. There is a small (1- 33%) amount of necrotic tissue within the wound bed including Adherent Slough. 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 There was a T Contact otal Cast Procedure by Ricard Dillon., MD. Post procedure Diagnosis Wound #5: Same as Pre-Procedure Plan Follow-up  Appointments: Return Appointment in 1 week. - Wed. with Glynn Octave Shower/ Hygiene: May shower with protection but do not get wound dressing(s) wet. 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 Follow Hyperbaric Oxygen Glycemia Protocol Afrin (Oxymetazoline HCL) 0.05% nasal spray - 1 spray in both nostrils daily as needed prior to HBO treatment for difficulty clearing ears WOUND #5: - Metatarsal head fifth Wound Laterality: Left Prim Dressing: KerraCel Ag Gelling Fiber Dressing, 2x2 in (silver alginate) 1 x Per Week/ ary Discharge Instructions: Apply silver alginate to wound bed , pack lightly into undermining Secondary Dressing: Woven Gauze Sponges 2x2 in 1 x Per Week/ Discharge Instructions: Apply over primary dressing as directed. Secondary Dressing: Optifoam Non-Adhesive Dressing, 4x4 in 1 x Per Week/ Discharge Instructions: Apply over primary dressing cut to make foam donut Secured With: 24M Medipore H Soft Cloth Surgical T 4 x 2 (in/yd) 1 x Per Week/ ape Discharge Instructions: Secure dressing with tape as directed. 1. 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 2. Nice improvement since the last time I have seen him. 3. Were using silver alginate under a total contact cast. 4. I wonder if a course of Oasis might help to fill this and will run this through his insurance. 5. He is also is undergoing HBO therapy today Electronic Signature(s) Signed: 04/13/2020 8:09:40 AM By: Linton Ham MD Entered By: Linton Ham on 04/07/2020  09:05:06 -------------------------------------------------------------------------------- Total Contact Cast Details Patient Name: Date of Service: Martin [name], Martin [name] 04/07/2020 8:00 A M Medical Record Number: [id number] Patient Account Number: [id number] Date of Birth/Sex: Treating RN: May 09, 1945 (74 y.o. Martin [name] Primary Care Provider: Garret [name] Other Clinician: Referring Provider: Treating Provider/Extender: Martin [name] in Treatment: 60 T Contact Cast Applied for Wound Assessment: otal Wound #5 Left Metatarsal head fifth Performed By: Physician Ricard Dillon., MD Post Procedure Diagnosis Same as Pre-procedure Electronic Signature(s) Signed: 04/13/2020 8:09:40 AM By: Linton Ham MD Entered By: Linton Ham on 04/07/2020 09:01:56 -------------------------------------------------------------------------------- SuperBill Details Patient Name: Date of Service: Martin [name], Martin [name] 04/07/2020 Medical Record Number: [id number] Patient Account Number: [id number] Date of Birth/Sex: Treating RN: Feb 14, 1946 (74 y.o. Martin [name] Primary Care Provider: Garret [name] Other Clinician: Referring Provider: Treating Provider/Extender: Martin [name] in Treatment: 15 Diagnosis Coding ICD-10 Codes Code Description [id number] Type 2 diabetes mellitus with foot ulcer L97.522 Non-pressure chronic ulcer of  other part of left foot with fat layer exposed I10 Essential (primary) hypertension I25.10 Atherosclerotic heart disease of native coronary artery without angina pectoris M86.272 Subacute osteomyelitis, left ankle and foot Facility Procedures CPT4 Code: [id number] Description: [phone] - APPLY TOTAL CONTACT LEG CAST ICD-10 Diagnosis Description L97.522 Non-pressure chronic ulcer of other part of left foot with fat layer exposed Modifier: Quantity: 1 Physician Procedures : CPT4 Code Description Modifier [id number] - WC  PHYS APPLY TOTAL CONTACT CAST ICD-10 Diagnosis Description L97.522 Non-pressure chronic ulcer of other part of left foot with fat layer exposed Quantity: 1 Electronic Signature(s) Signed: 04/13/2020 8:09:40 AM By: Linton Ham MD Entered By: Linton Ham on 04/07/2020 09:05:16

## 2020-04-13 NOTE — Progress Notes (Signed)
Martin [name], Martin [name] ([id number]) Visit Report for 04/07/2020 SuperBill Details Patient Name: Date of Service: Martin [name], Martin [name] 04/07/2020 Medical Record Number: [id number] Patient Account Number: [id number] Date of Birth/Sex: Treating RN: 31-Aug-1945 (74 y.o. [name], [name] Primary Care Provider: [name] Other Clinician: [name] Referring Provider: Treating Provider/Extender: [name] in Treatment: 15 Diagnosis Coding ICD-10 Codes Code Description [phone] Type 2 diabetes mellitus with foot ulcer L97.522 Non-pressure chronic ulcer of other part of left foot with fat layer exposed I10 Essential (primary) hypertension I25.10 Atherosclerotic heart disease of native coronary artery without angina pectoris M86.272 Subacute osteomyelitis, left ankle and foot Facility Procedures CPT4 Code Description Modifier Quantity [id number]-(Facility Use Only) HBOT full body chamber, 60min , 4 Physician Procedures Quantity CPT4 Code Description Modifier [id number] - WC PHYS HYPERBARIC OXYGEN THERAPY 1 ICD-10 Diagnosis Description E11.621 Type 2 diabetes mellitus with foot ulcer Electronic Signature(s) Signed: 04/07/2020 3:25:04 PM By: [name] EMT/HBOT/SD Signed: 04/13/2020 8:09:40 AM By: Linton Ham MD Entered By: [name] on 04/07/2020 13:12:34

## 2020-04-13 NOTE — Progress Notes (Addendum)
KASHTON, MCARTOR ([id number]) Visit Report for 04/13/2020 HBO Details Patient Name: Date of Service: TIRON, SUSKI 04/13/2020 1:00 PM Medical Record Number: [id number] Patient Account Number: [id number] Date of Birth/Sex: Treating RN: 12-11-1945 (74 y.o. [name], [name] Primary Care Aristide Waggle: [name] Other Clinician: [name] Referring Kena Limon: Treating Shanee Batch/Extender: [name] in Treatment: 16 HBO Treatment Course Details Treatment Course Number: 1 Ordering Yuvonne Lanahan: Worthy Keeler T Treatments Ordered: otal 40 HBO Treatment Start Date: 02/22/2020 HBO Indication: Diabetic Ulcer(s) of the Lower Extremity HBO Treatment Details Treatment Number: 29 Patient Type: Outpatient Chamber Type: Monoplace Chamber Serial #: [id number] Treatment Protocol: 2.0 ATA with 90 minutes oxygen, and no air breaks Treatment Details Compression Rate Down: 2.0 psi / minute De-Compression Rate Up: 2.0 psi / minute Air breaks and breathing Decompress Decompress Compress Tx Pressure Begins Reached periods Begins Ends (leave unused spaces blank) Chamber Pressure (ATA 1 2 ------2 1 ) Clock Time (24 hr) 13:24 13:32 - - - - - - 15:02 15:10 Treatment Length: 106 (minutes) Treatment Segments: 4 Vital Signs Capillary Blood Glucose Reference Range: 80 - 120 mg / dl HBO Diabetic Blood Glucose Intervention Range: <131 mg/dl or >249 mg/dl Time Vitals Blood Respiratory Capillary Blood Glucose Pulse Action Type: Pulse: Temperature: Taken: Pressure: Rate: Glucose (mg/dl): Meter #: Oximetry (%) Taken: Pre 13:00 170/74 105 16 98.2 263 Post 15:12 178/78 87 15 98.1 248 Treatment Response Treatment Toleration: Well Treatment Completion Status: Treatment Completed without Adverse Event Additional Procedure Documentation Tissue Sevierity: Necrosis of bone Zymere Patlan Notes No concerns for treatment given. Patient was also seen for leg swelling, separate note  generated Physician HBO Attestation: I certify that I supervised this HBO treatment in accordance with Medicare guidelines. A trained emergency response team is readily available per Yes hospital policies and procedures. Continue HBOT as ordered. Yes Electronic Signature(s) Signed: 04/14/2020 8:01:39 AM By: Linton Ham MD Entered By: Linton Ham on 04/13/2020 15:51:08 -------------------------------------------------------------------------------- HBO Safety Checklist Details Patient Name: Date of Service: [name] 04/13/2020 1:00 PM Medical Record Number: [id number] Patient Account Number: [id number] Date of Birth/Sex: Treating RN: July 12, 1945 (74 y.o. [name], [name] Primary Care Mariam Helbert: [name] Other Clinician: [name] Referring Patrecia Veiga: Treating Yarnell Kozloski/Extender: [name] in Treatment: 16 HBO Safety Checklist Items Safety Checklist Consent Form Signed Patient voided / foley secured and emptied When did you last eato 1200 - hamburger Last dose of injectable or oral agent insulin Ostomy pouch emptied and vented if applicable NA All implantable devices assessed, documented and approved NA Intravenous access site secured and place NA Valuables secured Linens and cotton and cotton/polyester blend (less than 51% polyester) Personal oil-based products / skin lotions / body lotions removed Wigs or hairpieces removed NA Smoking or tobacco materials removed NA Books / newspapers / magazines / loose paper removed Cologne, aftershave, perfume and deodorant removed Jewelry removed (may wrap wedding band) Make-up removed NA Hair care products removed Battery operated devices (external) removed NA Heating patches and chemical warmers removed NA Titanium eyewear removed NA Nail polish cured greater than 10 hours NA Casting material cured greater than 10 hours NA Hearing aids removed NA Loose dentures or  partials removed NA Prosthetics have been removed NA Patient demonstrates correct use of air break device (if applicable) Patient concerns have been addressed Patient grounding bracelet on and cord attached to chamber Specifics for Inpatients (complete in addition to above) Medication sheet sent with patient Intravenous medications needed or due during therapy sent  with patient Drainage tubes (e.g. nasogastric tube or chest tube secured and vented) Endotracheal or Tracheotomy tube secured Cuff deflated of air and inflated with saline Airway suctioned Electronic Signature(s) Signed: 04/13/2020 1:28:21 PM By: [name] EMT/HBOT/SD Entered By: [name] on 04/13/2020 13:28:21

## 2020-04-13 NOTE — Progress Notes (Signed)
ROC, STREETT ([id number]) Visit Report for 04/07/2020 Arrival Information Details Patient Name: Date of Service: Martin [name], Martin [name] 04/07/2020 8:00 A M Medical Record Number: [id number] Patient Account Number: [id number] Date of Birth/Sex: Treating RN: 02/12/1946 (74 y.o. [name] Primary Care Jazilyn Siegenthaler: [name] Other Clinician: Referring Maddax Palinkas: Treating Demeka Sutter/Extender: [name] in Treatment: 15 Visit Information History Since Last Visit Added or deleted any medications: No Patient Arrived: Ambulatory Any new allergies or adverse reactions: No Arrival Time: 08:09 Had a fall or experienced change in No Accompanied By: self activities of daily living that may affect Transfer Assistance: None risk of falls: Patient Identification Verified: Yes Signs or symptoms of abuse/neglect since last visito No Secondary Verification Process Completed: Yes Hospitalized since last visit: No Patient Requires Transmission-Based Precautions: No Implantable device outside of the clinic excluding No Patient Has Alerts: No cellular tissue based products placed in the center since last visit: Has Dressing in Place as Prescribed: Yes Has Footwear/Offloading in Place as Prescribed: Yes Left: T Contact Cast otal Pain Present Now: No Electronic Signature(s) Signed: 04/07/2020 2:12:11 PM By: Deon Pilling Entered By: Deon Pilling on 04/07/2020 08:27:39 -------------------------------------------------------------------------------- Encounter Discharge Information Details Patient Name: Date of Service: Martin [name]. 04/07/2020 8:00 A M Medical Record Number: [id number] Patient Account Number: [id number] Date of Birth/Sex: Treating RN: Sep 03, 1945 (74 y.o. [name] Primary Care Havah Ammon: [name] Other Clinician: Referring Glenn Christo: Treating Glenmore Karl/Extender: [name] in Treatment: 15 Encounter  Discharge Information Items Discharge Condition: Stable Ambulatory Status: Ambulatory Discharge Destination: Home Transportation: Private Auto Accompanied By: alone Schedule Follow-up Appointment: Yes Clinical Summary of Care: Patient Declined Electronic Signature(s) Signed: 04/11/2020 5:24:10 PM By: [name] RN, BSN Entered By: [name] on 04/07/2020 09:38:32 -------------------------------------------------------------------------------- Lower Extremity Assessment Details Patient Name: Date of Service: Martin [name]. 04/07/2020 8:00 A M Medical Record Number: [id number] Patient Account Number: [id number] Date of Birth/Sex: Treating RN: 1945/07/18 (74 y.o. [name] Primary Care Shaunae Sieloff: [name] Other Clinician: Referring Manual Navarra: Treating Augustus Zurawski/Extender: [name] in Treatment: 15 Edema Assessment Assessed: [name]: Yes] [name]: No] Edema: [Left: Ye] [Right: s] Calf Left: Right: Point of Measurement: 32 cm From Medial Instep 39.8 cm Ankle Left: Right: Point of Measurement: 11 cm From Medial Instep 29.5 cm Vascular Assessment Pulses: Dorsalis Pedis Palpable: [Left:Yes] Electronic Signature(s) Signed: 04/07/2020 2:12:11 PM By: Deon Pilling Entered By: Deon Pilling on 04/07/2020 08:28:42 -------------------------------------------------------------------------------- Multi Wound Chart Details Patient Name: Date of Service: Martin [name]. 04/07/2020 8:00 A M Medical Record Number: [id number] Patient Account Number: [id number] Date of Birth/Sex: Treating RN: [birth year] (74 y.o. [name] Primary Care Sabas Frett: [name] Other Clinician: Referring Chenay Nesmith: Treating Alsie Younes/Extender: [name] in Treatment: 15 Vital Signs Height(in): 72 Capillary Blood Glucose(mg/dl): 134 Weight(lbs): 270 Pulse(bpm): 18 Body Mass Index(BMI): 30 Blood Pressure(mmHg):  186/89 Temperature(F): 98.1 Respiratory Rate(breaths/min): 16 Photos: [5:No Photos Left Metatarsal head fifth] [N/A:N/A N/A] Wound Location: [5:Gradually Appeared] [N/A:N/A] Wounding Event: [5:Diabetic Wound/Ulcer of the Lower] [N/A:N/A] Primary Etiology: [5:Extremity Cataracts, Coronary Artery Disease,] [N/A:N/A] Comorbid History: [5:Hypertension, Type II Diabetes, Neuropathy 10/31/2019] [N/A:N/A] Date Acquired: [5:15] [N/A:N/A] Weeks of Treatment: [5:Open] [N/A:N/A] Wound Status: [5:0.5x0.4x0.4] [N/A:N/A] Measurements L x W x D (cm) [5:0.157] [N/A:N/A] A (cm) : rea [5:0.063] [N/A:N/A] Volume (cm) : [5:87.00%] [N/A:N/A] % Reduction in A rea: [5:92.60%] [N/A:N/A] % Reduction in Volume: [5:4] Starting Position 1 (o'clock): [5:10] Ending Position 1 (o'clock): [5:1] Maximum Distance 1 (cm): [5:Yes] [N/A:N/A] Undermining: [  5:Grade 3] [N/A:N/A] Classification: [5:Medium] [N/A:N/A] Exudate A mount: [5:Serous] [N/A:N/A] Exudate Type: [5:amber] [N/A:N/A] Exudate Color: [5:Well defined, not attached] [N/A:N/A] Wound Margin: [5:Large (67-100%)] [N/A:N/A] Granulation A mount: [5:Pink] [N/A:N/A] Granulation Quality: [5:Small (1-33%)] [N/A:N/A] Necrotic A mount: [5:Fat Layer (Subcutaneous Tissue): Yes N/A] Exposed Structures: [5:Fascia: No Tendon: No Muscle: No Joint: No Bone: No Medium (34-66%)] [N/A:N/A] Epithelialization: [5:T Contact Cast otal] [N/A:N/A] Treatment Notes Electronic Signature(s) Signed: 04/07/2020 5:15:15 PM By: Baruch Gouty RN, BSN Signed: 04/13/2020 8:09:40 AM By: Linton Ham MD Entered By: Linton Ham on 04/07/2020 09:01:48 -------------------------------------------------------------------------------- Multi-Disciplinary Care Plan Details Patient Name: Date of Service: Martin [name]. 04/07/2020 8:00 A M Medical Record Number: [id number] Patient Account Number: [id number] Date of Birth/Sex: Treating RN: 26-Nov-1945 (74 y.o. [name] Primary Care Aiyanna Awtrey: [name] Other Clinician: Referring Georjean Toya: Treating Lannah Koike/Extender: [name] in Treatment: 15 Active Inactive Nutrition Nursing Diagnoses: Impaired glucose control: actual or potential Potential for alteratiion in Nutrition/Potential for imbalanced nutrition Goals: Patient/caregiver will maintain therapeutic glucose control Date Initiated: 12/22/2019 Target Resolution Date: 04/19/2020 Goal Status: Active Interventions: Assess HgA1c results as ordered upon admission and as needed Treatment Activities: Patient referred to Primary Care Physician for further nutritional evaluation : 12/22/2019 Notes: Soft Tissue Infection Nursing Diagnoses: Impaired tissue integrity Potential for infection: soft tissue Goals: Patient's soft tissue infection will resolve Date Initiated: 03/29/2020 Target Resolution Date: 04/26/2020 Goal Status: Active Interventions: Assess signs and symptoms of infection every visit Provide education on infection Treatment Activities: Culture and sensitivity : 03/29/2020 Education provided on Infection : 03/29/2020 Systemic antibiotics : 03/29/2020 T ordered outside of clinic : 03/29/2020 est Notes: Wound/Skin Impairment Nursing Diagnoses: Impaired tissue integrity Knowledge deficit related to ulceration/compromised skin integrity Goals: Patient/caregiver will verbalize understanding of skin care regimen Date Initiated: 12/22/2019 Target Resolution Date: 04/12/2020 Goal Status: Active Ulcer/skin breakdown will have a volume reduction of 30% by week 4 Date Initiated: 12/22/2019 Date Inactivated: 01/19/2020 Target Resolution Date: 01/19/2020 Goal Status: Unmet Unmet Reason: infection Ulcer/skin breakdown will have a volume reduction of 50% by week 8 Date Initiated: 01/19/2020 Date Inactivated: 02/16/2020 Target Resolution Date: 02/16/2020 Goal Status: Unmet Unmet Reason:  infection Ulcer/skin breakdown will have a volume reduction of 80% by week 12 Date Initiated: 02/16/2020 Date Inactivated: 03/15/2020 Target Resolution Date: 03/15/2020 Goal Status: Unmet Unmet Reason: osteo, getting HBO Interventions: Assess patient/caregiver ability to obtain necessary supplies Assess patient/caregiver ability to perform ulcer/skin care regimen upon admission and as needed Assess ulceration(s) every visit Provide education on ulcer and skin care Treatment Activities: Skin care regimen initiated : 12/22/2019 Topical wound management initiated : 12/22/2019 Notes: Electronic Signature(s) Signed: 04/07/2020 5:15:15 PM By: Baruch Gouty RN, BSN Entered By: Baruch Gouty on 04/07/2020 08:20:33 -------------------------------------------------------------------------------- Pain Assessment Details Patient Name: Date of Service: Martin [name]. 04/07/2020 8:00 A M Medical Record Number: [id number] Patient Account Number: [id number] Date of Birth/Sex: Treating RN: 23-May-1945 (74 y.o. [name] Primary Care Raphaela Cannaday: [name] Other Clinician: Referring Cyera Balboni: Treating Kaedin Hicklin/Extender: [name] in Treatment: 15 Active Problems Location of Pain Severity and Description of Pain Patient Has Paino No Site Locations Rate the pain. Current Pain Level: 0 Pain Management and Medication Current Pain Management: Medication: No Cold Application: No Rest: No Massage: No Activity: No T.E.N.S.: No Heat Application: No Leg drop or elevation: No Is the Current Pain Management Adequate: Adequate How does your wound impact your activities of daily livingo Sleep: No Bathing: No Appetite: No Relationship With Others: No Bladder  Continence: No Emotions: No Bowel Continence: No Work: No Toileting: No Drive: No Dressing: No Hobbies: No Electronic Signature(s) Signed: 04/07/2020 2:12:11 PM By: Deon Pilling Entered By:  Deon Pilling on 04/07/2020 08:28:29 -------------------------------------------------------------------------------- Patient/Caregiver Education Details Patient Name: Date of Service: Martin [name] 12/10/2021andnbsp8:00 [place] Record Number: [id number] Patient Account Number: [id number] Date of Birth/Gender: Treating RN: Feb 20, 1946 (74 y.o. [name] Primary Care Physician: [name] Other Clinician: Referring Physician: Treating Physician/Extender: [name] in Treatment: 15 Education Assessment Education Provided To: Patient Education Topics Provided Offloading: Methods: Explain/Verbal Responses: Reinforcements needed, State content correctly Wound/Skin Impairment: Methods: Explain/Verbal Responses: Reinforcements needed, State content correctly Electronic Signature(s) Signed: 04/07/2020 5:15:15 PM By: Baruch Gouty RN, BSN Entered By: Baruch Gouty on 04/07/2020 08:21:07 -------------------------------------------------------------------------------- Wound Assessment Details Patient Name: Date of Service: Martin [name]. 04/07/2020 8:00 A M Medical Record Number: [id number] Patient Account Number: [id number] Date of Birth/Sex: Treating RN: Jan 31, 1946 (74 y.o. [name], [name] Primary Care Shepard Keltz: [name] Other Clinician: Referring Tomoko Sandra: Treating Josilynn Losh/Extender: [name] in Treatment: 15 Wound Status Wound Number: 5 Primary Diabetic Wound/Ulcer of the Lower Extremity Etiology: Wound Location: Left Metatarsal head fifth Wound Open Wounding Event: Gradually Appeared Status: Date Acquired: 10/31/2019 Comorbid Cataracts, Coronary Artery Disease, Hypertension, Type II Weeks Of Treatment: 15 History: Diabetes, Neuropathy Clustered Wound: No Wound Measurements Length: (cm) 0.5 Width: (cm) 0.4 Depth: (cm) 0.4 Area: (cm) 0.157 Volume: (cm) 0.063 % Reduction in  Area: 87% % Reduction in Volume: 92.6% Epithelialization: Medium (34-66%) Tunneling: No Undermining: Yes Starting Position (o'clock): 4 Ending Position (o'clock): 10 Maximum Distance: (cm) 1 Wound Description Classification: Grade 3 Wound Margin: Well defined, not attached Exudate Amount: Medium Exudate Type: Serous Exudate Color: amber Foul Odor After Cleansing: No Slough/Fibrino Yes Wound Bed Granulation Amount: Large (67-100%) Exposed Structure Granulation Quality: Pink Fascia Exposed: No Necrotic Amount: Small (1-33%) Fat Layer (Subcutaneous Tissue) Exposed: Yes Necrotic Quality: Adherent Slough Tendon Exposed: No Muscle Exposed: No Joint Exposed: No Bone Exposed: No Electronic Signature(s) Signed: 04/07/2020 2:12:11 PM By: Deon Pilling Signed: 04/07/2020 5:15:15 PM By: Baruch Gouty RN, BSN Entered By: Baruch Gouty on 04/07/2020 08:43:58 -------------------------------------------------------------------------------- Vitals Details Patient Name: Date of Service: Martin [name]. 04/07/2020 8:00 A M Medical Record Number: [id number] Patient Account Number: [id number] Date of Birth/Sex: Treating RN: October 09, 1945 (74 y.o. [name], [name] Primary Care Erianna Jolly: Other Clinician: [name] Referring Keontre Defino: Treating Tasheba Henson/Extender: [name] in Treatment: 15 Vital Signs Time Taken: 08:10 Temperature (F): 98.1 Height (in): 72 Pulse (bpm): 73 Weight (lbs): 270 Respiratory Rate (breaths/min): 16 Body Mass Index (BMI): 36.6 Blood Pressure (mmHg): 186/89 Capillary Blood Glucose (mg/dl): 134 Reference Range: 80 - 120 mg / dl Notes patient is stressed and concerned with wife's health at this time. Will recheck BP manually at discharge. Electronic Signature(s) Signed: 04/07/2020 2:12:11 PM By: Deon Pilling Entered By: Deon Pilling on 04/07/2020 08:28:20

## 2020-04-13 NOTE — Progress Notes (Signed)
JAMYSON, JIRAK ([id number]) Visit Report for 04/10/2020 SuperBill Details Patient Name: Date of Service: AC, COLAN 04/10/2020 Medical Record Number: [id number] Patient Account Number: [id number] Date of Birth/Sex: Treating RN: [birth year] (74 y.o. 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

## 2020-04-13 NOTE — Progress Notes (Signed)
Martin [name] ([id number]) Visit Report for 04/10/2020 HBO Details Patient Name: Date of Service: Martin [name], Martin [name] 04/10/2020 8:00 A M Medical Record Number: [id number] Patient Account Number: [id number] Date of Birth/Sex: Treating RN: 1945-11-14 (74 y.o. [name], [name] Primary Care Mikella Linsley: [name] Other Clinician: Referring Frimet Durfee: Treating Mohd Clemons/Extender: [name] in Treatment: 15 HBO Treatment Course Details Treatment Course Number: 1 Ordering Shauntel Prest: Worthy Keeler T Treatments Ordered: otal 40 HBO Treatment Start Date: 02/22/2020 HBO Indication: Diabetic Ulcer(s) of the Lower Extremity HBO Treatment Details Treatment Number: 27 Patient Type: Outpatient Chamber Type: Monoplace Chamber Serial #: [id number] Treatment Protocol: 2.0 ATA with 90 minutes oxygen, and no air breaks Treatment Details Compression Rate Down: 2.0 psi / minute De-Compression Rate Up: 2.0 psi / minute Air breaks and breathing Decompress Decompress Compress Tx Pressure Begins Reached periods Begins Ends (leave unused spaces blank) Chamber Pressure (ATA 1 2 ------2 1 ) Clock Time (24 hr) 08:18 08:26 - - - - - - 09:56 10:04 Treatment Length: 106 (minutes) Treatment Segments: 4 Vital Signs Capillary Blood Glucose Reference Range: 80 - 120 mg / dl HBO Diabetic Blood Glucose Intervention Range: <131 mg/dl or >249 mg/dl Time Vitals Blood Respiratory Capillary Blood Glucose Pulse Action Type: Pulse: Temperature: Taken: Pressure: Rate: Glucose (mg/dl): Meter #: Oximetry (%) Taken: Pre 07:55 173/82 79 20 98 187 Post 10:05 153/76 72 16 98.2 168 Treatment Response Treatment Toleration: Well Treatment Completion Status: Treatment Completed without Adverse Event Additional Procedure Documentation Tissue Sevierity: Necrosis of bone Shalandria Elsbernd Notes No concerns with treatment given Physician HBO Attestation: I certify that I supervised this HBO  treatment in accordance with Medicare guidelines. A trained emergency response team is readily available per Yes hospital policies and procedures. Continue HBOT as ordered. Yes Electronic Signature(s) Signed: 04/13/2020 8:09:40 AM By: Linton Ham MD Previous Signature: 04/11/2020 4:43:44 PM Version By: Deon Pilling Previous Signature: 04/11/2020 4:43:44 PM Version By: Deon Pilling Entered By: Linton Ham on 04/13/2020 08:07:32 -------------------------------------------------------------------------------- HBO Safety Checklist Details Patient Name: Date of Service: Martin [name]. 04/10/2020 8:00 A M Medical Record Number: [id number] Patient Account Number: [id number] Date of Birth/Sex: Treating RN: [birth year] (74 y.o. [name], [name] Primary Care Chalsey Leeth: [name] Other Clinician: Referring Jashua Knaak: Treating Buck Mcaffee/Extender: [name] in Treatment: 15 HBO Safety Checklist Items Safety Checklist Consent Form Signed Patient voided / foley secured and emptied When did you last eato 0730- yogurt and orange juice Last dose of injectable or oral agent Sunday insulin Ostomy pouch emptied and vented if applicable NA All implantable devices assessed, documented and approved NA Intravenous access site secured and place NA Valuables secured Linens and cotton and cotton/polyester blend (less than 51% polyester) Personal oil-based products / skin lotions / body lotions removed Wigs or hairpieces removed NA Smoking or tobacco materials removed NA Books / newspapers / magazines / loose paper removed Cologne, aftershave, perfume and deodorant removed Jewelry removed (may wrap wedding band) Make-up removed NA Hair care products removed Battery operated devices (external) removed NA Heating patches and chemical warmers removed NA Titanium eyewear removed NA Nail polish cured greater than 10 hours NA Casting material cured  greater than 10 hours Hearing aids removed NA Loose dentures or partials removed NA Prosthetics have been removed NA Patient demonstrates correct use of air break device (if applicable) Patient concerns have been addressed Patient grounding bracelet on and cord attached to chamber Specifics for Inpatients (complete in addition to above) Medication sheet sent with patient  Intravenous medications needed or due during therapy sent with patient Drainage tubes (e.g. nasogastric tube or chest tube secured and vented) Endotracheal or Tracheotomy tube secured Cuff deflated of air and inflated with saline Airway suctioned Electronic Signature(s) Signed: 04/11/2020 4:43:44 PM By: Deon Pilling Entered By: Deon Pilling on 04/10/2020 10:38:21

## 2020-04-14 ENCOUNTER — Encounter (HOSPITAL_BASED_OUTPATIENT_CLINIC_OR_DEPARTMENT_OTHER): Payer: PPO | Admitting: Internal Medicine

## 2020-04-14 DIAGNOSIS — E11621 Type 2 diabetes mellitus with foot ulcer: Secondary | ICD-10-CM | POA: Diagnosis not present

## 2020-04-14 DIAGNOSIS — L97514 Non-pressure chronic ulcer of other part of right foot with necrosis of bone: Secondary | ICD-10-CM | POA: Diagnosis not present

## 2020-04-14 DIAGNOSIS — L97522 Non-pressure chronic ulcer of other part of left foot with fat layer exposed: Secondary | ICD-10-CM | POA: Diagnosis not present

## 2020-04-14 LAB — GLUCOSE, CAPILLARY
Glucose-Capillary: 131 mg/dL — ABNORMAL HIGH (ref 70–99)
Glucose-Capillary: 191 mg/dL — ABNORMAL HIGH (ref 70–99)

## 2020-04-14 NOTE — Progress Notes (Signed)
Martin [name], Martin [name] ([id number]) Visit Report for 04/14/2020 Arrival Information Details Patient Name: Date of Service: Martin [name], Martin [name] 04/14/2020 8:15 A M Medical Record Number: [id number] Patient Account Number: [id number] Date of Birth/Sex: Treating RN: 1946/01/08 (73 y.o. Martin [name] Primary Care Jaquia Benedicto: [name] Other Clinician: [name] Referring Jeremiah Curci: Treating Ronalda Walpole/Extender: [name] in Treatment: 61 Visit Information History Since Last Visit Added or deleted any medications: No Patient Arrived: [name] Any new allergies or adverse reactions: No Arrival Time: 08:45 Had a fall or experienced change in No Accompanied By: self activities of daily living that may affect Transfer Assistance: None risk of falls: Patient Identification Verified: Yes Signs or symptoms of abuse/neglect since last visito No Secondary Verification Process Completed: Yes Hospitalized since last visit: No Patient Requires Transmission-Based Precautions: No Implantable device outside of the clinic excluding No Patient Has Alerts: No cellular tissue based products placed in the center since last visit: Pain Present Now: No Electronic Signature(s) Signed: 04/14/2020 3:29:44 PM By: [name] EMT/HBOT/SD Entered By: [name] on 04/14/2020 12:56:55 -------------------------------------------------------------------------------- Encounter Discharge Information Details Patient Name: Date of Service: Martin [name]. 04/14/2020 8:15 A M Medical Record Number: [id number] Patient Account Number: [id number] Date of Birth/Sex: Treating RN: 25-May-1945 (74 y.o. Martin [name] Primary Care Yacine Garriga: [name] Other Clinician: [name] Referring Aubrii Sharpless: Treating Zilla Shartzer/Extender: [name] in Treatment: 16 Encounter Discharge Information Items Discharge Condition: Stable Ambulatory Status:  Cane Discharge Destination: Home Transportation: Private Auto Accompanied By: self Schedule Follow-up Appointment: Yes Clinical Summary of Care: Patient Declined Electronic Signature(s) Signed: 04/14/2020 3:29:44 PM By: [name] EMT/HBOT/SD Entered By: [name] on 04/14/2020 13:05:22 -------------------------------------------------------------------------------- Patient/Caregiver Education Details Patient Name: Date of Service: Martin [name] 12/17/2021andnbsp8:15 A M Medical Record Number: [id number] Patient Account Number: [id number] Date of Birth/Gender: Treating RN: June 27, 1945 (74 y.o. Martin [name] Primary Care Physician: [name] Other Clinician: [name] Referring Physician: Treating Physician/Extender: [name] in Treatment: 16 Education Assessment Education Provided To: Patient Education Topics Provided Hyperbaric Oxygenation: Methods: Explain/Verbal Responses: State content correctly Electronic Signature(s) Signed: 04/14/2020 3:29:44 PM By: [name] EMT/HBOT/SD Entered By: [name] on 04/14/2020 13:05:09 -------------------------------------------------------------------------------- Vitals Details Patient Name: Date of Service: Martin [name]. 04/14/2020 8:15 A M Medical Record Number: [id number] Patient Account Number: [id number] Date of Birth/Sex: Treating RN: [birth year] (74 y.o. Martin [name] Primary Care Michaeljohn Biss: [name] Other Clinician: [name] Referring Yair Dusza: Treating Thomasene Dubow/Extender: [name] in Treatment: 16 Vital Signs Time Taken: 08:50 Temperature (F): 98.3 Height (in): 72 Pulse (bpm): 76 Weight (lbs): 270 Respiratory Rate (breaths/min): 16 Body Mass Index (BMI): 36.6 Blood Pressure (mmHg): 153/75 Capillary Blood Glucose (mg/dl): 131 Reference Range: 80 - 120 mg / dl Notes Pt given glucerna Electronic  Signature(s) Signed: 04/14/2020 12:57:30 PM By: [name] EMT/HBOT/SD Entered By: [name] on 04/14/2020 12:57:30

## 2020-04-14 NOTE — Progress Notes (Signed)
JAWARA, LATORRE ([id number]) Visit Report for 04/14/2020 Arrival Information Details Patient Name: Date of Service: ZOHAIR, EPP 04/14/2020 7:30 A M Medical Record Number: [id number] Patient Account Number: [id number] Date of Birth/Sex: Treating RN: 01-Dec-1945 (74 y.o. [name] Primary Care Carmelo Reidel: [name] Other Clinician: Referring Keara Pagliarulo: Treating Chace Bisch/Extender: [name] in Treatment: 63 Visit Information History Since Last Visit Added or deleted any medications: No Patient Arrived: [name] Any new allergies or adverse reactions: No Arrival Time: 07:49 Had a fall or experienced change in No Accompanied By: self activities of daily living that may affect Transfer Assistance: None risk of falls: Patient Identification Verified: Yes Signs or symptoms of abuse/neglect since No Secondary Verification Process Completed: Yes last visito Patient Requires Transmission-Based Precautions: No Hospitalized since last visit: No Patient Has Alerts: No Implantable device outside of the clinic No excluding cellular tissue based products placed in the center since last visit: Has Dressing in Place as Prescribed: Yes Has Footwear/Offloading in Place as Yes Prescribed: Left: Surgical Shoe with Pressure Relief Insole Pain Present Now: Yes Electronic Signature(s) Signed: 04/14/2020 5:32:58 PM By: Deon Pilling Entered By: Deon Pilling on 04/14/2020 07:58:56 -------------------------------------------------------------------------------- Clinic Level of Care Assessment Details Patient Name: Date of Service: BARTOLO, MONTANYE 04/14/2020 7:30 A M Medical Record Number: [id number] Patient Account Number: [id number] Date of Birth/Sex: Treating RN: [birth year] (74 y.o. [name] Primary Care Vann Okerlund: [name] Other Clinician: Referring Candon Caras: Treating Vivika Poythress/Extender: [name] in Treatment:  16 Clinic Level of Care Assessment Items TOOL 4 Quantity Score X- 1 0 Use when only an EandM is performed on FOLLOW-UP visit ASSESSMENTS - Nursing Assessment / Reassessment X- 1 10 Reassessment of Co-morbidities (includes updates in patient status) X- 1 5 Reassessment of Adherence to Treatment Plan ASSESSMENTS - Wound and Skin A ssessment / Reassessment X - Simple Wound Assessment / Reassessment - one wound 1 5 []  - 0 Complex Wound Assessment / Reassessment - multiple wounds []  - 0 Dermatologic / Skin Assessment (not related to wound area) ASSESSMENTS - Focused Assessment []  - 0 Circumferential Edema Measurements - multi extremities []  - 0 Nutritional Assessment / Counseling / Intervention X- 1 5 Lower Extremity Assessment (monofilament, tuning fork, pulses) []  - 0 Peripheral Arterial Disease Assessment (using hand held doppler) ASSESSMENTS - Ostomy and/or Continence Assessment and Care []  - 0 Incontinence Assessment and Management []  - 0 Ostomy Care Assessment and Management (repouching, etc.) PROCESS - Coordination of Care X - Simple Patient / Family Education for ongoing care 1 15 []  - 0 Complex (extensive) Patient / Family Education for ongoing care X- 1 10 Staff obtains Programmer, systems, Records, T Results / Process Orders est []  - 0 Staff telephones HHA, Nursing Homes / Clarify orders / etc []  - 0 Routine Transfer to another Facility (non-emergent condition) []  - 0 Routine Hospital Admission (non-emergent condition) []  - 0 New Admissions / Biomedical engineer / Ordering NPWT Apligraf, etc. , []  - 0 Emergency Hospital Admission (emergent condition) X- 1 10 Simple Discharge Coordination []  - 0 Complex (extensive) Discharge Coordination PROCESS - Special Needs []  - 0 Pediatric / Minor Patient Management []  - 0 Isolation Patient Management []  - 0 Hearing / Language / Visual special needs []  - 0 Assessment of Community assistance (transportation, D/C  planning, etc.) []  - 0 Additional assistance / Altered mentation []  - 0 Support Surface(s) Assessment (bed, cushion, seat, etc.) INTERVENTIONS - Wound Cleansing / Measurement X - Simple Wound Cleansing - one  wound 1 5 []  - 0 Complex Wound Cleansing - multiple wounds X- 1 5 Wound Imaging (photographs - any number of wounds) []  - 0 Wound Tracing (instead of photographs) X- 1 5 Simple Wound Measurement - one wound []  - 0 Complex Wound Measurement - multiple wounds INTERVENTIONS - Wound Dressings []  - 0 Small Wound Dressing one or multiple wounds []  - 0 Medium Wound Dressing one or multiple wounds X- 1 20 Large Wound Dressing one or multiple wounds []  - 0 Application of Medications - topical []  - 0 Application of Medications - injection INTERVENTIONS - Miscellaneous []  - 0 External ear exam []  - 0 Specimen Collection (cultures, biopsies, blood, body fluids, etc.) []  - 0 Specimen(s) / Culture(s) sent or taken to Lab for analysis []  - 0 Patient Transfer (multiple staff / Civil Service fast streamer / Similar devices) []  - 0 Simple Staple / Suture removal (25 or less) []  - 0 Complex Staple / Suture removal (26 or more) []  - 0 Hypo / Hyperglycemic Management (close monitor of Blood Glucose) []  - 0 Ankle / Brachial Index (ABI) - do not check if billed separately X- 1 5 Vital Signs Has the patient been seen at the hospital within the last three years: Yes Total Score: 100 Level Of Care: New/Established - Level 3 Electronic Signature(s) Signed: 04/14/2020 5:46:53 PM By: [name] RN, BSN Entered By: [name] on 04/14/2020 17:22:20 -------------------------------------------------------------------------------- Encounter Discharge Information Details Patient Name: Date of Service: [name] 04/14/2020 7:30 A M Medical Record Number: [id number] Patient Account Number: [id number] Date of Birth/Sex: Treating RN: [birth year] (74 y.o. [name], [name] Primary Care  Nikolaj Geraghty: [name] Other Clinician: Referring Dionisios Ricci: Treating Marcia Lepera/Extender: [name] in Treatment: 16 Encounter Discharge Information Items Discharge Condition: Stable Ambulatory Status: Cane Discharge Destination: Home Transportation: Private Auto Accompanied By: self Schedule Follow-up Appointment: Yes Clinical Summary of Care: Patient Declined Electronic Signature(s) Signed: 04/14/2020 5:28:37 PM By: Rhae Hammock RN Entered By: Rhae Hammock on 04/14/2020 08:32:47 -------------------------------------------------------------------------------- Lower Extremity Assessment Details Patient Name: Date of Service: [name] 04/14/2020 7:30 A M Medical Record Number: [id number] Patient Account Number: [id number] Date of Birth/Sex: Treating RN: [birth year] (73 y.o. [name] Primary Care Tandi Hanko: [name] Other Clinician: Referring Cohen Doleman: Treating Arnice Vanepps/Extender: [name] in Treatment: 16 Edema Assessment Assessed: [name]: Yes] [name]: No] Edema: [Left: Ye] [Right: s] Calf Left: Right: Point of Measurement: 32 cm From Medial Instep 42.5 cm Ankle Left: Right: Point of Measurement: 11 cm From Medial Instep 31.5 cm Vascular Assessment Pulses: Dorsalis Pedis Palpable: [Left:Yes] Electronic Signature(s) Signed: 04/14/2020 5:32:58 PM By: Deon Pilling Entered By: Deon Pilling on 04/14/2020 08:00:12 -------------------------------------------------------------------------------- Multi Wound Chart Details Patient Name: Date of Service: [name] 04/14/2020 7:30 A M Medical Record Number: [id number] Patient Account Number: [id number] Date of Birth/Sex: Treating RN: 12/15/1945 (74 y.o. [name] Primary Care Natalyia Innes: [name] Other Clinician: Referring Juanantonio Stolar: Treating Devante Capano/Extender: [name] in Treatment:  16 Vital Signs Height(in): 72 Pulse(bpm): 75 Weight(lbs): 270 Blood Pressure(mmHg): 153/75 Body Mass Index(BMI): 37 Temperature(F): 98.3 Respiratory Rate(breaths/min): 16 Photos: [5:No Photos Left Metatarsal head fifth] [N/A:N/A N/A] Wound Location: [5:Gradually Appeared] [N/A:N/A] Wounding Event: [5:Diabetic Wound/Ulcer of the Lower] [N/A:N/A] Primary Etiology: [5:Extremity Cataracts, Coronary Artery Disease, N/A] Comorbid History: [5:Hypertension, Type II Diabetes, Neuropathy 10/31/2019] [N/A:N/A] Date Acquired: [5:16] [N/A:N/A] Weeks of Treatment: [5:Open] [N/A:N/A] Wound Status: [5:1x1x0.4] [N/A:N/A] Measurements L x W x D (cm) [5:0.785] [N/A:N/A] A (cm) : rea [  5:0.314] [N/A:N/A] Volume (cm) : [5:35.10%] [N/A:N/A] % Reduction in A rea: [5:62.90%] [N/A:N/A] % Reduction in Volume: [5:1] Starting Position 1 (o'clock): [5:6] Ending Position 1 (o'clock): [5:1.5] Maximum Distance 1 (cm): [5:Yes] [N/A:N/A] Undermining: [5:Grade 3] [N/A:N/A] Classification: [5:Medium] [N/A:N/A] Exudate A mount: [5:Serosanguineous] [N/A:N/A] Exudate Type: [5:red, brown] [N/A:N/A] Exudate Color: [5:Well defined, not attached] [N/A:N/A] Wound Margin: [5:Medium (34-66%)] [N/A:N/A] Granulation A mount: [5:Pink] [N/A:N/A] Granulation Quality: [5:Medium (34-66%)] [N/A:N/A] Necrotic A mount: [5:Fat Layer (Subcutaneous Tissue): Yes N/A] Exposed Structures: [5:Fascia: No Tendon: No Muscle: No Joint: No Bone: No Medium (34-66%)] [N/A:N/A] Treatment Notes Electronic Signature(s) Signed: 04/14/2020 4:58:57 PM By: Linton Ham MD Signed: 04/14/2020 5:46:53 PM By: [name] RN, BSN Entered By: Linton Ham on 04/14/2020 08:29:35 -------------------------------------------------------------------------------- Multi-Disciplinary Care Plan Details Patient Name: Date of Service: [name] 04/14/2020 7:30 A M Medical Record Number: [id number] Patient Account Number: [id number] Date of  Birth/Sex: Treating RN: 09-01-1945 (74 y.o. [name] Primary Care Saide Lanuza: [name] Other Clinician: Referring Nyheem Binette: Treating Damany Eastman/Extender: [name] in Treatment: 16 Active Inactive Nutrition Nursing Diagnoses: Impaired glucose control: actual or potential Potential for alteratiion in Nutrition/Potential for imbalanced nutrition Goals: Patient/caregiver will maintain therapeutic glucose control Date Initiated: 12/22/2019 Target Resolution Date: 04/19/2020 Goal Status: Active Interventions: Assess HgA1c results as ordered upon admission and as needed Treatment Activities: Patient referred to Primary Care Physician for further nutritional evaluation : 12/22/2019 Notes: Soft Tissue Infection Nursing Diagnoses: Impaired tissue integrity Potential for infection: soft tissue Goals: Patient's soft tissue infection will resolve Date Initiated: 03/29/2020 Target Resolution Date: 04/26/2020 Goal Status: Active Interventions: Assess signs and symptoms of infection every visit Provide education on infection Treatment Activities: Culture and sensitivity : 03/29/2020 Education provided on Infection : 04/12/2020 Systemic antibiotics : 03/29/2020 T ordered outside of clinic : 03/29/2020 est Notes: Wound/Skin Impairment Nursing Diagnoses: Impaired tissue integrity Knowledge deficit related to ulceration/compromised skin integrity Goals: Patient/caregiver will verbalize understanding of skin care regimen Date Initiated: 12/22/2019 Target Resolution Date: 05/10/2020 Goal Status: Active Ulcer/skin breakdown will have a volume reduction of 30% by week 4 Date Initiated: 12/22/2019 Date Inactivated: 01/19/2020 Target Resolution Date: 01/19/2020 Goal Status: Unmet Unmet Reason: infection Ulcer/skin breakdown will have a volume reduction of 50% by week 8 Date Initiated: 01/19/2020 Date Inactivated: 02/16/2020 Target Resolution Date:  02/16/2020 Goal Status: Unmet Unmet Reason: infection Ulcer/skin breakdown will have a volume reduction of 80% by week 12 Date Initiated: 02/16/2020 Date Inactivated: 03/15/2020 Target Resolution Date: 03/15/2020 Goal Status: Unmet Unmet Reason: osteo, getting HBO Interventions: Assess patient/caregiver ability to obtain necessary supplies Assess patient/caregiver ability to perform ulcer/skin care regimen upon admission and as needed Assess ulceration(s) every visit Provide education on ulcer and skin care Treatment Activities: Skin care regimen initiated : 12/22/2019 Topical wound management initiated : 12/22/2019 Notes: Electronic Signature(s) Signed: 04/14/2020 5:46:53 PM By: [name] RN, BSN Entered By: [name] on 04/14/2020 07:50:51 -------------------------------------------------------------------------------- Pain Assessment Details Patient Name: Date of Service: [name] 04/14/2020 7:30 A M Medical Record Number: [id number] Patient Account Number: [id number] Date of Birth/Sex: Treating RN: 1946-02-03 (74 y.o. [name] Primary Care Shadiamond Koska: [name] Other Clinician: Referring Shristi Scheib: Treating Antwione Picotte/Extender: [name] in Treatment: 16 Active Problems Location of Pain Severity and Description of Pain Patient Has Paino Yes Site Locations Pain Location: Pain in Ulcers Rate the pain. Current Pain Level: 6 Worst Pain Level: 10 Least Pain Level: 0 Tolerable Pain Level: 8 Character of Pain Describe the Pain: Heavy, Hervey Ard  Pain Management and Medication Current Pain Management: Medication: Yes Cold Application: No Rest: No Massage: No Activity: No T.E.N.S.: No Heat Application: No Leg drop or elevation: Yes Is the Current Pain Management Adequate: Adequate How does your wound impact your activities of daily livingo Sleep: No Bathing: No Appetite: No Relationship With Others: No Bladder  Continence: No Emotions: No Bowel Continence: No Work: No Toileting: No Drive: No Dressing: No Hobbies: No Notes per patient pain from wound to lateral part of foot especially when dependent touching the floor. Electronic Signature(s) Signed: 04/14/2020 5:32:58 PM By: Deon Pilling Entered By: Deon Pilling on 04/14/2020 08:00:00 -------------------------------------------------------------------------------- Patient/Caregiver Education Details Patient Name: Date of Service: [name] 12/17/2021andnbsp7:30 A M Medical Record Number: [id number] Patient Account Number: [id number] Date of Birth/Gender: Treating RN: 12-01-1945 (74 y.o. [name] Primary Care Physician: [name] Other Clinician: Referring Physician: Treating Physician/Extender: [name] in Treatment: 16 Education Assessment Education Provided To: Patient Education Topics Provided Wound/Skin Impairment: Methods: Explain/Verbal Responses: State content correctly [employer]) Signed: 04/14/2020 5:46:53 PM By: [name] RN, BSN Entered By: [name] on 04/14/2020 07:51:10 -------------------------------------------------------------------------------- Wound Assessment Details Patient Name: Date of Service: [name] 04/14/2020 7:30 A M Medical Record Number: [id number] Patient Account Number: [id number] Date of Birth/Sex: Treating RN: [birth year] (74 y.o. [name], [name] Primary Care Layson Bertsch: [name] Other Clinician: Referring Nary Sneed: Treating Cray Monnin/Extender: [name] in Treatment: 16 Wound Status Wound Number: 5 Primary Diabetic Wound/Ulcer of the Lower Extremity Etiology: Wound Location: Left Metatarsal head fifth Wound Open Wounding Event: Gradually Appeared Status: Date Acquired: 10/31/2019 Comorbid Cataracts, Coronary Artery Disease, Hypertension, Type II Weeks Of Treatment:  16 History: Diabetes, Neuropathy Clustered Wound: No Wound Measurements Length: (cm) 1 Width: (cm) 1 Depth: (cm) 0.4 Area: (cm) 0.785 Volume: (cm) 0.314 % Reduction in Area: 35.1% % Reduction in Volume: 62.9% Epithelialization: Medium (34-66%) Tunneling: No Undermining: Yes Starting Position (o'clock): 1 Ending Position (o'clock): 6 Maximum Distance: (cm) 1.5 Wound Description Classification: Grade 3 Wound Margin: Well defined, not attached Exudate Amount: Medium Exudate Type: Serosanguineous Exudate Color: red, brown Foul Odor After Cleansing: No Slough/Fibrino Yes Wound Bed Granulation Amount: Medium (34-66%) Exposed Structure Granulation Quality: Pink Fascia Exposed: No Necrotic Amount: Medium (34-66%) Fat Layer (Subcutaneous Tissue) Exposed: Yes Necrotic Quality: Adherent Slough Tendon Exposed: No Muscle Exposed: No Joint Exposed: No Bone Exposed: No Treatment Notes Wound #5 (Metatarsal head fifth) Wound Laterality: Left Cleanser Peri-Wound Care Topical Primary Dressing KerraCel Ag Gelling Fiber Dressing, 2x2 in (silver alginate) Discharge Instruction: Lightly pack undermining Secondary Dressing Woven Gauze Sponges 2x2 in Discharge Instruction: Apply over primary dressing as directed. Optifoam Non-Adhesive Dressing, 4x4 in Discharge Instruction: Foam donut Secured With Compression Wrap Kerlix Roll 4.5x3.1 (in/yd) Discharge Instruction: Apply Kerlix and Coban compression as directed. Coban Self-Adherent Wrap 4x5 (in/yd) Discharge Instruction: Apply over Kerlix as directed. Compression Stockings Add-Ons Electronic Signature(s) Signed: 04/14/2020 5:32:58 PM By: Deon Pilling Entered By: Deon Pilling on 04/14/2020 08:00:44 -------------------------------------------------------------------------------- Vitals Details Patient Name: Date of Service: [name] 04/14/2020 7:30 A M Medical Record Number: [id number] Patient Account Number:  [id number] Date of Birth/Sex: Treating RN: 1945/06/27 (74 y.o. [name], [name] Primary Care Shalyn Koral: [name] Other Clinician: Referring Lameka Disla: Treating Wende Longstreth/Extender: [name] in Treatment: 16 Vital Signs Time Taken: 07:49 Temperature (F): 98.3 Height (in): 72 Pulse (bpm): 76 Weight (lbs): 270 Respiratory Rate (breaths/min): 16 Body Mass Index (BMI): 36.6 Blood Pressure (mmHg): 153/75 Reference Range: 80 -  120 mg / dl Electronic Signature(s) Signed: 04/14/2020 5:32:58 PM By: Deon Pilling Entered By: Deon Pilling on 04/14/2020 07:59:19

## 2020-04-14 NOTE — Progress Notes (Signed)
Martin [name], Martin [name] ([id number]) Visit Report for 04/14/2020 SuperBill Details Patient Name: Date of Service: Martin [name], Martin [name] 04/14/2020 Medical Record Number: [id number] Patient Account Number: [id number] Date of Birth/Sex: Treating RN: [birth year] (74 y.o. 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

## 2020-04-14 NOTE — Progress Notes (Addendum)
CAMRAN, [name] ([id number]) Visit Report for 04/14/2020 HBO Details Patient Name: Date of Service: Martin [name], Martin [name] 04/14/2020 8:15 A M Medical Record Number: [id number] Patient Account Number: [id number] Date of Birth/Sex: Treating RN: 09-05-1945 (74 y.o. Martin [name] Primary Care Chalise Pe: [name] Other Clinician: [name] Referring Martin [name]: Treating Debanhi Blaker/Extender: [name] in Treatment: 16 HBO Treatment Course Details Treatment Course Number: 1 Ordering Jodye Scali: Worthy Keeler T Treatments Ordered: otal 40 HBO Treatment Start Date: 02/22/2020 HBO Indication: Diabetic Ulcer(s) of the Lower Extremity HBO Treatment Details Treatment Number: 30 Patient Type: Outpatient Chamber Type: Monoplace Chamber Serial #: [id number] Treatment Protocol: 2.0 ATA with 90 minutes oxygen, and no air breaks Treatment Details Compression Rate Down: 2.0 psi / minute De-Compression Rate Up: 2.0 psi / minute Air breaks and breathing Decompress Decompress Compress Tx Pressure Begins Reached periods Begins Ends (leave unused spaces blank) Chamber Pressure (ATA 1 2 ------2 1 ) Clock Time (24 hr) 09:01 09:09 - - - - - - 10:39 10:47 Treatment Length: 106 (minutes) Treatment Segments: 4 Vital Signs Capillary Blood Glucose Reference Range: 80 - 120 mg / dl HBO Diabetic Blood Glucose Intervention Range: <131 mg/dl or >249 mg/dl Time Vitals Blood Respiratory Capillary Blood Glucose Pulse Action Type: Pulse: Temperature: Taken: Pressure: Rate: Glucose (mg/dl): Meter #: Oximetry (%) Taken: Pre 08:50 153/75 76 16 98.3 131 Post 10:50 143/72 75 16 98.6 191 Treatment Response Treatment Toleration: Well Treatment Completion Status: Treatment Completed without Adverse Event Additional Procedure Documentation Tissue Sevierity: Necrosis of bone [name] Notes Patient was also seen for wound care visit and follow-up cellulitis and edema of his  leg. Otherwise he tolerated HBO well Physician HBO Attestation: I certify that I supervised this HBO treatment in accordance with Medicare guidelines. A trained emergency response team is readily available per Yes hospital policies and procedures. Continue HBOT as ordered. Yes Electronic Signature(s) Signed: 04/14/2020 4:58:57 PM By: Linton Ham MD Previous Signature: 04/14/2020 3:29:44 PM Version By: [name] EMT/HBOT/SD Previous Signature: 04/14/2020 3:29:44 PM Version By: [name] EMT/HBOT/SD Entered By: Linton Ham on 04/14/2020 16:57:45 -------------------------------------------------------------------------------- HBO Safety Checklist Details Patient Name: Date of Service: Martin [name]. 04/14/2020 8:15 A M Medical Record Number: [id number] Patient Account Number: [id number] Date of Birth/Sex: Treating RN: 1945-08-17 (74 y.o. Martin [name] Primary Care Orpah Hausner: [name] Other Clinician: [name] Referring Martin [name]: Treating Breaker Springer/Extender: [name] in Treatment: 16 HBO Safety Checklist Items Safety Checklist Consent Form Signed Patient voided / foley secured and emptied When did you last eato 0700 - glucerna Last dose of injectable or oral agent insulin Ostomy pouch emptied and vented if applicable NA All implantable devices assessed, documented and approved NA Intravenous access site secured and place NA Valuables secured Linens and cotton and cotton/polyester blend (less than 51% polyester) Personal oil-based products / skin lotions / body lotions removed Wigs or hairpieces removed NA Smoking or tobacco materials removed NA Books / newspapers / magazines / loose paper removed Cologne, aftershave, perfume and deodorant removed Jewelry removed (may wrap wedding band) Make-up removed NA Hair care products removed Battery operated devices (external) removed NA Heating patches and chemical  warmers removed NA Titanium eyewear removed NA Nail polish cured greater than 10 hours NA Casting material cured greater than 10 hours NA Hearing aids removed NA Loose dentures or partials removed NA Prosthetics have been removed NA Patient demonstrates correct use of air break device (if applicable) Patient concerns have been addressed Patient grounding  bracelet on and cord attached to chamber Specifics for Inpatients (complete in addition to above) Medication sheet sent with patient Intravenous medications needed or due during therapy sent with patient Drainage tubes (e.g. nasogastric tube or chest tube secured and vented) Endotracheal or Tracheotomy tube secured Cuff deflated of air and inflated with saline Airway suctioned Electronic Signature(s) Signed: 04/14/2020 12:58:24 PM By: [name] EMT/HBOT/SD Entered By: [name] on 04/14/2020 12:58:24

## 2020-04-15 LAB — AEROBIC CULTURE W GRAM STAIN (SUPERFICIAL SPECIMEN): Gram Stain: NONE SEEN

## 2020-04-17 ENCOUNTER — Other Ambulatory Visit: Payer: Self-pay

## 2020-04-17 ENCOUNTER — Other Ambulatory Visit (HOSPITAL_COMMUNITY): Payer: Self-pay | Admitting: Internal Medicine

## 2020-04-17 ENCOUNTER — Other Ambulatory Visit: Payer: Self-pay | Admitting: Internal Medicine

## 2020-04-17 ENCOUNTER — Encounter (HOSPITAL_BASED_OUTPATIENT_CLINIC_OR_DEPARTMENT_OTHER): Payer: PPO | Admitting: Internal Medicine

## 2020-04-17 DIAGNOSIS — L97522 Non-pressure chronic ulcer of other part of left foot with fat layer exposed: Secondary | ICD-10-CM | POA: Diagnosis not present

## 2020-04-17 DIAGNOSIS — L97514 Non-pressure chronic ulcer of other part of right foot with necrosis of bone: Secondary | ICD-10-CM | POA: Diagnosis not present

## 2020-04-17 DIAGNOSIS — M869 Osteomyelitis, unspecified: Secondary | ICD-10-CM

## 2020-04-17 DIAGNOSIS — E11621 Type 2 diabetes mellitus with foot ulcer: Secondary | ICD-10-CM

## 2020-04-17 LAB — GLUCOSE, CAPILLARY
Glucose-Capillary: 167 mg/dL — ABNORMAL HIGH (ref 70–99)
Glucose-Capillary: 164 mg/dL — ABNORMAL HIGH (ref 70–99)

## 2020-04-17 NOTE — Progress Notes (Signed)
Martin [name], Martin [name] ([id number]) Visit Report for 04/17/2020 Arrival Information Details Patient Name: Date of Service: Martin [name], Martin [name] 04/17/2020 8:15 A M Medical Record Number: [id number] Patient Account Number: [id number] Date of Birth/Sex: Treating RN: Sep 21, 1945 (74 y.o. Martin [name], Martin [name] Primary Care Shreyansh Tiffany: [name] Other Clinician: [name] Referring Krisinda Giovanni: Treating Jamichael Knotts/Extender: [name] in Treatment: 51 Visit Information History Since Last Visit Added or deleted any medications: No Patient Arrived: Martin [name] Any new allergies or adverse reactions: No Arrival Time: 08:30 Had a fall or experienced change in No Accompanied By: self activities of daily living that may affect Transfer Assistance: None risk of falls: Patient Identification Verified: Yes Signs or symptoms of abuse/neglect since last visito No Secondary Verification Process Completed: Yes Hospitalized since last visit: No Patient Requires Transmission-Based Precautions: No Implantable device outside of the clinic excluding No Patient Has Alerts: No cellular tissue based products placed in the center since last visit: Pain Present Now: No Electronic Signature(s) Signed: 04/17/2020 3:44:42 PM By: [name] EMT/HBOT/SD Entered By: [name] on 04/17/2020 08:42:59 -------------------------------------------------------------------------------- Encounter Discharge Information Details Patient Name: Date of Service: Martin [name]. 04/17/2020 8:15 A M Medical Record Number: [id number] Patient Account Number: [id number] Date of Birth/Sex: Treating RN: May 17, 1945 (74 y.o. Martin [name] Primary Care Aristeo Hankerson: [name] Other Clinician: [name] Referring Deshay Kirstein: Treating Isabelly Kobler/Extender: [name] in Treatment: 16 Encounter Discharge Information Items Discharge Condition: Stable Ambulatory Status:  Cane Discharge Destination: Home Transportation: Private Auto Accompanied By: self Schedule Follow-up Appointment: Yes Clinical Summary of Care: Patient Declined Electronic Signature(s) Signed: 04/17/2020 3:44:42 PM By: [name] EMT/HBOT/SD Entered By: [name] on 04/17/2020 11:35:24 -------------------------------------------------------------------------------- Patient/Caregiver Education Details Patient Name: Date of Service: Martin [name] 12/20/2021andnbsp8:15 A M Medical Record Number: [id number] Patient Account Number: [id number] Date of Birth/Gender: Treating RN: 05-Dec-1945 (74 y.o. Martin [name] Primary Care Physician: [name] Other Clinician: [name] Referring Physician: Treating Physician/Extender: [name] in Treatment: 16 Education Assessment Education Provided To: Patient Education Topics Provided Hyperbaric Oxygenation: Methods: Explain/Verbal Responses: State content correctly Electronic Signature(s) Signed: 04/17/2020 3:44:42 PM By: [name] EMT/HBOT/SD Entered By: [name] on 04/17/2020 11:35:09 -------------------------------------------------------------------------------- Vitals Details Patient Name: Date of Service: Martin [name]. 04/17/2020 8:15 A M Medical Record Number: [id number] Patient Account Number: [id number] Date of Birth/Sex: Treating RN: Jul 11, 1945 (74 y.o. Martin [name] Primary Care Valeda Corzine: [name] Other Clinician: [name] Referring Jeilyn Reznik: Treating Kaliel Bolds/Extender: [name] in Treatment: 16 Vital Signs Time Taken: 08:35 Temperature (F): 98.3 Height (in): 72 Pulse (bpm): 79 Weight (lbs): 270 Respiratory Rate (breaths/min): 18 Body Mass Index (BMI): 36.6 Blood Pressure (mmHg): 173/88 Capillary Blood Glucose (mg/dl): 167 Reference Range: 80 - 120 mg / dl Electronic Signature(s) Signed: 04/17/2020  3:44:42 PM By: [name] EMT/HBOT/SD Entered By: [name] on 04/17/2020 08:43:16

## 2020-04-17 NOTE — Progress Notes (Signed)
ATHAN, CASALINO ([id number]) Visit Report for 04/13/2020 Arrival Information Details Patient Name: Date of Service: Martin [name], Martin [name] 04/13/2020 1:00 PM Medical Record Number: [id number] Patient Account Number: [id number] Date of Birth/Sex: Treating RN: 09-27-1945 (74 y.o. [name], [name] Primary Care Ashni Lonzo: [name] Other Clinician: [name] Referring Bert Ptacek: Treating Indiana Pechacek/Extender: [name] in Treatment: 45 Visit Information History Since Last Visit Added or deleted any medications: No Patient Arrived: [name] Any new allergies or adverse reactions: No Arrival Time: 12:55 Had a fall or experienced change in No Accompanied By: self activities of daily living that may affect Transfer Assistance: None risk of falls: Patient Identification Verified: Yes Signs or symptoms of abuse/neglect since last visito No Secondary Verification Process Completed: Yes Hospitalized since last visit: No Patient Requires Transmission-Based Precautions: No Implantable device outside of the clinic excluding No Patient Has Alerts: No cellular tissue based products placed in the center since last visit: Pain Present Now: No Electronic Signature(s) Signed: 04/13/2020 4:57:41 PM By: [name] EMT/HBOT/SD Entered By: [name] on 04/13/2020 13:27:22 -------------------------------------------------------------------------------- Encounter Discharge Information Details Patient Name: Date of Service: Martin [name]. 04/13/2020 1:00 PM Medical Record Number: [id number] Patient Account Number: [id number] Date of Birth/Sex: Treating RN: 1945/06/17 (74 y.o. [name] Primary Care Jeyla Bulger: [name] Other Clinician: [name] Referring Katerra Ingman: Treating Jeson Camacho/Extender: [name] in Treatment: 16 Encounter Discharge Information Items Discharge Condition: Stable Ambulatory Status: Cane Discharge  Destination: Home Transportation: Private Auto Accompanied By: self Schedule Follow-up Appointment: Yes Clinical Summary of Care: Patient Declined Electronic Signature(s) Signed: 04/13/2020 4:57:41 PM By: [name] EMT/HBOT/SD Entered By: [name] on 04/13/2020 15:37:13 -------------------------------------------------------------------------------- Patient/Caregiver Education Details Patient Name: Date of Service: Martin [name] 12/16/2021andnbsp1:00 PM Medical Record Number: [id number] Patient Account Number: [id number] Date of Birth/Gender: Treating RN: 1945/05/04 (74 y.o. [name] Primary Care Physician: [name] Other Clinician: [name] Referring Physician: Treating Physician/Extender: [name] in Treatment: 16 Education Assessment Education Provided To: Patient Education Topics Provided Hyperbaric Oxygenation: Methods: Explain/Verbal Responses: State content correctly Electronic Signature(s) Signed: 04/13/2020 4:57:41 PM By: [name] EMT/HBOT/SD Entered By: [name] on 04/13/2020 15:36:50 -------------------------------------------------------------------------------- Vitals Details Patient Name: Date of Service: Martin [name]. 04/13/2020 1:00 PM Medical Record Number: [id number] Patient Account Number: [id number] Date of Birth/Sex: Treating RN: [birth year] (74 y.o. [name], [name] Primary Care Shahara Hartsfield: [name] Other Clinician: [name] Referring Runette Scifres: Treating Amia Rynders/Extender: [name] in Treatment: 16 Vital Signs Time Taken: 13:00 Temperature (F): 98.2 Height (in): 72 Pulse (bpm): 105 Weight (lbs): 270 Respiratory Rate (breaths/min): 16 Body Mass Index (BMI): 36.6 Blood Pressure (mmHg): 170/74 Capillary Blood Glucose (mg/dl): 263 Reference Range: 80 - 120 mg / dl Electronic Signature(s) Signed: 04/13/2020 4:57:41 PM By:  [name] EMT/HBOT/SD Entered By: [name] on 04/13/2020 13:27:46

## 2020-04-17 NOTE — Progress Notes (Addendum)
[name], SITU ([id number]) Visit Report for 04/17/2020 HBO Details Patient Name: Date of Service: Martin [name], Martin [name] 04/17/2020 8:15 A M Medical Record Number: [id number] Patient Account Number: [id number] Date of Birth/Sex: Treating RN: 30-Sep-1945 (74 y.o. Martin [name] Primary Care Naomi Castrogiovanni: [name] Other Clinician: [name] Referring Erich Kochan: Treating Arlis Everly/Extender: [name] in Treatment: 16 HBO Treatment Course Details Treatment Course Number: 1 Ordering Vonnie Spagnolo: Worthy Keeler T Treatments Ordered: otal 40 HBO Treatment Start Date: 02/22/2020 HBO Indication: Diabetic Ulcer(s) of the Lower Extremity HBO Treatment Details Treatment Number: 31 Patient Type: Outpatient Chamber Type: Monoplace Chamber Serial #: [id number] Treatment Protocol: 2.0 ATA with 90 minutes oxygen, and no air breaks Treatment Details Compression Rate Down: 2.0 psi / minute De-Compression Rate Up: 2.0 psi / minute Air breaks and breathing Decompress Decompress Compress Tx Pressure Begins Reached periods Begins Ends (leave unused spaces blank) Chamber Pressure (ATA 1 2 ------2 1 ) Clock Time (24 hr) 08:52 09:00 - - - - - - 10:30 10:38 Treatment Length: 106 (minutes) Treatment Segments: 4 Vital Signs Capillary Blood Glucose Reference Range: 80 - 120 mg / dl HBO Diabetic Blood Glucose Intervention Range: <131 mg/dl or >249 mg/dl Time Vitals Blood Respiratory Capillary Blood Glucose Pulse Action Type: Pulse: Temperature: Taken: Pressure: Rate: Glucose (mg/dl): Meter #: Oximetry (%) Taken: Pre 08:35 173/88 79 18 98.3 167 Post 10:40 152/81 75 18 98 164 Treatment Response Treatment Toleration: Well Treatment Completion Status: Treatment Completed without Adverse Event Additional Procedure Documentation Tissue Sevierity: Necrosis of bone Chevy Virgo Notes no concerns wither given. also seen for wound care review Physician HBO Attestation: I  certify that I supervised this HBO treatment in accordance with Medicare guidelines. A trained emergency response team is readily available per Yes hospital policies and procedures. Continue HBOT as ordered. Yes Electronic Signature(s) Signed: 04/17/2020 7:23:52 PM By: Linton Ham MD Previous Signature: 04/17/2020 3:44:42 PM Version By: [name] EMT/HBOT/SD Previous Signature: 04/17/2020 3:44:42 PM Version By: [name] EMT/HBOT/SD Entered By: Linton Ham on 04/17/2020 19:21:23 -------------------------------------------------------------------------------- HBO Safety Checklist Details Patient Name: Date of Service: Martin [name]. 04/17/2020 8:15 A M Medical Record Number: [id number] Patient Account Number: [id number] Date of Birth/Sex: Treating RN: 09-01-1945 (74 y.o. Martin [name] Primary Care Namiah Dunnavant: [name] Other Clinician: [name] Referring Jonovan Boedecker: Treating Avnoor Koury/Extender: [name] in Treatment: 16 HBO Safety Checklist Items Safety Checklist Consent Form Signed Patient voided / foley secured and emptied When did you last eato 0700 - OJ Last dose of injectable or oral agent insulin Ostomy pouch emptied and vented if applicable NA All implantable devices assessed, documented and approved NA Intravenous access site secured and place NA Valuables secured Linens and cotton and cotton/polyester blend (less than 51% polyester) Personal oil-based products / skin lotions / body lotions removed Wigs or hairpieces removed NA Smoking or tobacco materials removed NA Books / newspapers / magazines / loose paper removed Cologne, aftershave, perfume and deodorant removed Jewelry removed (may wrap wedding band) Make-up removed NA Hair care products removed Battery operated devices (external) removed NA Heating patches and chemical warmers removed NA Titanium eyewear removed NA Nail polish cured  greater than 10 hours NA Casting material cured greater than 10 hours NA Hearing aids removed NA Loose dentures or partials removed NA Prosthetics have been removed NA Patient demonstrates correct use of air break device (if applicable) Patient concerns have been addressed Patient grounding bracelet on and cord attached to chamber Specifics for Inpatients (complete  in addition to above) Medication sheet sent with patient Intravenous medications needed or due during therapy sent with patient Drainage tubes (e.g. nasogastric tube or chest tube secured and vented) Endotracheal or Tracheotomy tube secured Cuff deflated of air and inflated with saline Airway suctioned Electronic Signature(s) Signed: 04/17/2020 8:44:05 AM By: [name] EMT/HBOT/SD Entered By: [name] on 04/17/2020 08:44:04

## 2020-04-17 NOTE — Progress Notes (Signed)
PRINSTON, KYNARD ([id number]) Visit Report for 04/13/2020 Physician Orders Details Patient Name: Date of Service: [name], [name] 04/13/2020 1:00 PM Medical Record Number: [id number] Patient Account Number: [id number] Date of Birth/Sex: Treating RN: 1945-11-06 (74 y.o. 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, left ankle and foot Wound Treatment Services and Therapies Venous Studies -Unilateral left leg DVT rule out - ***Urgent*** left leg venous doppler DVT rule out - (ICD10 I25.10 - Atherosclerotic heart disease of native coronary artery without angina pectoris) Electronic Signature(s) Signed: 04/13/2020 5:23:27 PM By: Deon Pilling Signed: 04/14/2020 8:01:39 AM By: Linton Ham MD Entered By: Deon Pilling on 04/13/2020 13:19:58 Prescription 04/13/2020 -------------------------------------------------------------------------------- Isaac Bliss MD Patient Name: Provider: 09-24-1945 [id number] Date of Birth: NPI#[name] [id number] Sex: DEA #: [id number] Phone #: License #: [place] Patient Address: [place] [address] [place], [place] [address] [address] [place], [place] [address] [phone] Allergies Statins-Hmg-Coa Reductase Inhibitors; simvastatin; Levaquin Provider's Orders Venous Studies -Unilateral left leg DVT rule out - ICD10: I25.10 - ***Urgent*** left leg venous doppler DVT rule out Hand  Signature: Date(s): Electronic Signature(s) Signed: 04/13/2020 5:23:27 PM By: Deon Pilling Signed: 04/14/2020 8:01:39 AM By: Linton Ham MD Entered By: Deon Pilling on 04/13/2020 13:19:58 -------------------------------------------------------------------------------- Problem List Details Patient Name: Date of Service: Martin Agee. 04/13/2020 1:00 PM Medical Record Number: [id number] Patient Account Number: [id number] Date of Birth/Sex: Treating RN: October 31, 1945 (74 y.o. Martin [name], Martin [name] Primary Care Provider: [name] Other Clinician: Referring Provider: Treating Provider/Extender: [name] in Treatment: 16 Active Problems ICD-10 Encounter Code Description Active Date MDM Diagnosis E11.621 Type 2 diabetes mellitus with foot ulcer 12/22/2019 No Yes L97.522 Non-pressure chronic ulcer of other part of left foot with fat layer exposed 12/22/2019 No Yes I10 Essential (primary) hypertension 12/22/2019 No Yes I25.10 Atherosclerotic heart disease of native coronary artery without angina pectoris 12/22/2019 No Yes M86.272 Subacute osteomyelitis, left ankle and foot 02/10/2020 No Yes Inactive Problems Resolved Problems Electronic Signature(s) Signed: 04/13/2020 5:23:27 PM By: Deon Pilling Signed: 04/14/2020 8:01:39 AM By: Linton Ham MD Entered By: Deon Pilling on 04/13/2020 13:17:53 -------------------------------------------------------------------------------- SuperBill Details Patient Name: Date of Service: Martin Agee. 04/13/2020 Medical Record Number: [id number] Patient Account Number: [id number] Date of Birth/Sex: Treating RN: June 09, 1945 (74 y.o. 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

## 2020-04-17 NOTE — Progress Notes (Signed)
Martin [name], Martin [name] ([id number]) Visit Report for 04/17/2020 SuperBill Details Patient Name: Date of Service: Martin [name], Martin [name] 04/17/2020 Medical Record Number: [id number] Patient Account Number: [id number] Date of Birth/Sex: Treating RN: 06-21-1945 (74 y.o. 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

## 2020-04-17 NOTE — Progress Notes (Signed)
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 dressing changes without complication. Fortunately there is no signs of active infection at this time. No fevers, chills, nausea, vomiting, or diarrhea. Patient is tolerating hyperbarics quite well at this time and I see no signs of  infection currently which is great news. 03/22/2020 on evaluation today patient appears to be doing about the same in regard to his foot ulcer. He does have some tissue along the medial portion of the wound bed and I am unsure of exactly what this is just characteristically. It could be a small amount of tendon here to be honest. With that being said I do think it needs to be removed as I feel like it is hindering his healing possibilities. 11/29; acute visit; I was asked to see the patient urgently today because of complaints of pain in his foot. He is a wound in the right fifth metatarsal head plantar aspect with underlying osteomyelitis. He has been undergoing hyperbaric oxygen treatment completing antibiotics in the middle of this month. He said the pain had increased to the point that it was becoming difficult for him to walk on his foot. He has not been systemically unwell.. He had a fairly significant debridement on his last visit 5 days ago. 03/29/2020 on evaluation today patient appears to be doing much better than he was on Monday  according to the pictures and what I saw. Fortunately there is no signs of active infection at this time which is great news. There is no evidence of systemic infection either. Overall I feel like the patient is doing indeed much better and is just a matter of having him continue the antibiotic for the time being in my opinion and then depending on the results of the culture we may extend that for a bit longer. 12/10; I am seeing the patient today because of difficulty had this week with his wife's illness. He could not come on Wednesday. The wound looks a lot better than what I usually am used to seeing. He is using silver alginate and a total contact cast continuing with HBO. He has completed his antibiotics cultures and x-rays were negative 04/12/2020 upon evaluation today patient unfortunately appears to be doing a little worse in regard to his wound from the  standpoint of erythema surrounding. There does not appear to be any signs of active systemic infection which is great news. Nonetheless I am concerned about the fact that if we put him back in the cast he will not be able to visually see what was going on in regard to his foot and if anything is worsening he would have no idea into it could be potentially too late. 12/17; surrounding his dive yesterday afternoon it was brought to my attention about the extent of swelling in the left leg. I really did not have enough time to fully evaluate this before the end of his treatment so I arranged to have a duplex ultrasound done rule out DVT which thankfully turned out to be negative. I note that he was felt to have cellulitis of the left foot surrounding his wound when he was seen on Wednesday by Jeri Cos and was put on trimethoprim sulfamethoxazole. His report showed no evidence of a DVT or SVT . I am seeing him today in follow-up of this. As it turns out his calf measurements have not really changed that much but he has much more swelling in the ankle. It could be because we did not put a total contact cast back on him. I think he has some degree of chronic venous insufficiency. Objective Constitutional Patient is hypertensive.. Pulse regular and within target range for patient.Marland Kitchen Respirations regular, non-labored and within target range.. Temperature is normal and within the target range for the patient.Marland Kitchen Appears in no distress. Vitals Time Taken: 7:49 AM, Height: 72 in, Weight: 270 lbs, BMI: 36.6, Temperature: 98.3 F, Pulse: 76 bpm, Respiratory Rate: 16 breaths/min, Blood Pressure: 153/75 mmHg. Respiratory work of breathing is normal. Somewhat shallow air entry mild end expiratory wheezing. Cardiovascular Heart rhythm and rate regular, without murmur or gallop. JVP was not elevated no evidence of CHF. Pulses are palpable.. He has significant edema in the left greater than right leg. General  Notes: Wound exam; his wound actually looks quite good superficial but there is significant undermining from 6-12 o'clock. No exposed bone. As mentioned the erythema in his left lateral foot extending into the dorsal foot is much better than even yesterday afternoon suggesting the Trimethoprim/Sulfamethoxazole is effective Integumentary (Hair, Skin) Erythema around the wound extending into the lateral foot all looks better even since yesterday.. Wound #5 status is Open. Original cause of wound was Gradually Appeared. The wound is located on the Left Metatarsal head fifth. The wound measures 1cm length x 1cm width x 0.4cm depth; 0.785cm^2 area and 0.314cm^3 volume. There is Fat Layer (  Subcutaneous Tissue) exposed. There is no tunneling noted, however, there is undermining starting at 1:00 and ending at 6:00 with a maximum distance of 1.5cm. There is a medium amount of serosanguineous drainage noted. The wound margin is well defined and not attached to the wound base. There is medium (34-66%) pink granulation within the wound bed. There is a medium (34-66%) amount of necrotic tissue within the wound bed including Adherent Slough. 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 Off-Loading: Wedge shoe to: - front offloading shoe with felt to offload lateral foot Hyperbaric Oxygen Therapy: Indication: - wagner grade 3 diabetic foot ulcer left foot If appropriate for treatment, begin HBOT per protocol: 2.0 ATA for 90 Minutes without Air  Breaks T Number of Treatments: - 40 otal One treatments per day (delivered Monday through Friday unless otherwise specified in Special Instructions below): Finger stick Blood Glucose Pre- and Post- HBOT Treatment. Follow Hyperbaric Oxygen Glycemia Protocol Afrin (Oxymetazoline HCL) 0.05% nasal spray - 1 spray in both nostrils daily as needed prior to HBO treatment for difficulty clearing ears WOUND #5: - Metatarsal head fifth Wound Laterality: Left Prim Dressing: KerraCel Ag Gelling Fiber Dressing, 2x2 in (silver alginate) 1 x Per Week/ ary Discharge Instructions: Lightly pack undermining Secondary Dressing: Woven Gauze Sponges 2x2 in 1 x Per Week/ Discharge Instructions: Apply over primary dressing as directed. Secondary Dressing: Optifoam Non-Adhesive Dressing, 4x4 in 1 x Per Week/ Discharge Instructions: Foam donut Com pression Wrap: Kerlix Roll 4.5x3.1 (in/yd) 1 x Per Week/ Discharge Instructions: Apply Kerlix and Coban compression as directed. Com pression Wrap: Coban Self-Adherent Wrap 4x5 (in/yd) 1 x Per Week/ Discharge Instructions: Apply over Kerlix as directed. 1. I have been evaluating him yesterday afternoon this morning for significant swelling of his left ankle and left leg versus the right. A duplex ultrasound was negative for acute DVT or SVT Did note groin node enlargement although I am not sure what to make of this . 2. I think the Septra that was prescribed on Wednesday is effective judging by the improvement in the erythema around the foot into the dorsal foot laterally. His wound does not look particularly ominous today. The undermining however is concerning. I did not alter the on antibiotics. We are waiting for a culture to come back next week 3. We dressed his wound with silver alginate today and I am going to put him in kerlix Coban. I asked him to stay off his foot as much as he can this week. 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 4. Some of this may be chronic based on previous measurements of the leg dating back the last 3 4 to weeks but he seems to have more edema around the ankle Electronic Signature(s) Signed: 04/14/2020 4:58:57 PM By: Linton Ham MD Entered By: Linton Ham on 04/14/2020 08:35:04 -------------------------------------------------------------------------------- SuperBill Details Patient Name: Date of Service: Martin Agee. 04/14/2020 Medical Record Number: [id number] Patient Account Number: [id number] Date of Birth/Sex: Treating RN: [birth year] (74 y.o. [name] Primary Care Provider: [name] Other Clinician: Referring Provider: Treating Provider/Extender:  [name] in Treatment: 16 Diagnosis Coding ICD-10 Codes Code Description E11.621 Type 2 diabetes mellitus with foot ulcer L97.522 Non-pressure chronic ulcer of other part of left foot with fat layer exposed I10 Essential (primary) hypertension I25.10 Atherosclerotic heart disease of native coronary artery without angina pectoris M86.272 Subacute osteomyelitis, left ankle and foot L03.116 Cellulitis of left lower limb Facility Procedures CPT4 Code: [id number] Description: 99213 - WOUND CARE VISIT-LEV 3 EST PT Modifier: Quantity: 1 Electronic Signature(s) Signed: 04/14/2020 5:46:53 PM By: [name] RN, BSN Signed: 04/17/2020 7:23:52 PM By: Linton Ham MD Previous Signature: 04/14/2020 4:58:57 PM Version By: Linton Ham MD Entered By: [name] on 04/14/2020 17:22:29

## 2020-04-17 NOTE — Progress Notes (Signed)
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 progress in regard to his wound. This is measuring smaller today and overall very pleased. The  antibiotics which has seemed to help him he states that he is having a lot of improvement overall in his smell and taste as well as improvement in the overall nausea that he was experiencing with the linezolid. Obviously this is great news. 03/08/2020 upon evaluation today patient appears to be doing well with regard to his foot ulcer. I feel like this is showing signs of improvement it is measuring a little bit better today compared to previous. With that being said there is no evidence of active infection which is great news and in general I feel like the hyperbarics is helping him along with the antibiotics which she will be completing I believe in the next week. Outside of this also feel like that the total contact cast obviously has been of benefit. 03/15/2020 on evaluation today patient appears to be doing well in regard to his foot ulcer. He has been tolerating the dressing changes without complication. Fortunately there is no signs of active infection at this time. No fevers, chills, nausea, vomiting, or diarrhea. Patient is tolerating hyperbarics quite well at this time and I see no signs of infection currently which is great news. 03/22/2020 on evaluation today patient appears to  be doing about the same in regard to his foot ulcer. He does have some tissue along the medial portion of the wound bed and I am unsure of exactly what this is just characteristically. It could be a small amount of tendon here to be honest. With that being said I do think it needs to be removed as I feel like it is hindering his healing possibilities. 11/29; acute visit; I was asked to see the patient urgently today because of complaints of pain in his foot. He is a wound in the right fifth metatarsal head plantar aspect with underlying osteomyelitis. He has been undergoing hyperbaric oxygen treatment completing antibiotics in the middle of this month. He said the pain had increased to the point that it was becoming  difficult for him to walk on his foot. He has not been systemically unwell.. He had a fairly significant debridement on his last visit 5 days ago. 03/29/2020 on evaluation today patient appears to be doing much better than he was on Monday according to the pictures and what I saw. Fortunately there is no signs of active infection at this time which is great news. There is no evidence of systemic infection either. Overall I feel like the patient is doing indeed much better and is just a matter of having him continue the antibiotic for the time being in my opinion and then depending on the results of the culture we may extend that for a bit longer. 12/10; I am seeing the patient today because of difficulty had this week with his wife's illness. He could not come on Wednesday. The wound looks a lot better than what I usually am used to seeing. He is using silver alginate and a total contact cast continuing with HBO. He has completed his antibiotics cultures and x-rays were negative 04/12/2020 upon evaluation today patient unfortunately appears to be doing a little worse in regard to his wound from the standpoint of erythema surrounding. There does not appear to be any signs of active systemic infection which is great news. Nonetheless I am concerned about the fact that if we put him back in the cast he will not be able to visually see what was going on in regard to his foot and if anything is worsening he would have no idea into it could be potentially too late. 12/17; surrounding his dive yesterday afternoon it was brought to my attention about the extent of swelling in the left leg. I really did not have enough time to fully evaluate this before the end of his treatment so I arranged to have a duplex ultrasound done rule out DVT which thankfully turned out to be negative. I note that he was felt to have cellulitis of the left foot surrounding his wound when he was seen on Wednesday by Jeri Cos and was  put on trimethoprim sulfamethoxazole. His report showed no evidence of a DVT or SVT . I am seeing him today in follow-up of this. As it turns out his calf measurements have not really changed that much but he has much more swelling in the ankle. It could be because we did not put a total contact cast back on him. I think he has some degree of chronic venous insufficiency. 12/20; I wanted to look at the patient's leg when he was up on the table before he went into hyperbarics however he was put on the schedule today. He is still taking trimethoprim sulfamethoxazole. Culture that was done last Wednesday  is negative. We put him in kerlix Coban wrap over the weekend still a lot of swelling in this leg. DVT rule out was negative there is no swelling in his thigh not sure I really have a good explanation for this. He is not complaining of systemic symptoms Objective Constitutional Patient is hypertensive.. Pulse regular and within target range for patient.Marland Kitchen Respirations regular, non-labored and within target range.Marland Kitchen Appears in no distress. Does not look systemically unwell. Vitals Time Taken: 7:50 AM, Height: 72 in, Weight: 270 lbs, BMI: 36.6, Temperature: 98.3 F, Pulse: 79 bpm, Respiratory Rate: 18 breaths/min, Blood Pressure: 173/88 mmHg, Capillary Blood Glucose: 129 mg/dl. Cardiovascular Dorsalis pedis pulses palpable on the left. General Notes: Wound exam; his wound looks about the same as last week. This does not go to bone there is undermining medially. There is some erythema around the toe. Integumentary (Hair, Skin) Wound #5 status is Open. Original cause of wound was Gradually Appeared. The wound is located on the Left Metatarsal head fifth. The wound measures 1.5cm length x 1.3cm width x 0.3cm depth; 1.532cm^2 area and 0.459cm^3 volume. There is bone and Fat Layer (Subcutaneous Tissue) exposed. There is no tunneling noted, however, there is undermining starting at 12:00 and ending at 12:00  with a maximum distance of 1cm. There is a medium amount of serosanguineous drainage noted. The wound margin is well defined and not attached to the wound base. There is large (67-100%) pink granulation within the wound bed. There is a small (1-33%) amount of necrotic tissue within the wound bed including Adherent Slough. 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 There was a Three Layer Compression Therapy Procedure by [name], RN. Post procedure Diagnosis Wound #5: Same as Pre-Procedure Plan Follow-up Appointments: Return Appointment in: - Keep appt for 12/22 Bathing/ Shower/ Hygiene: May shower with protection but do not get wound dressing(s) wet. 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 Follow Hyperbaric Oxygen Glycemia Protocol Afrin (Oxymetazoline HCL) 0.05% nasal spray - 1 spray in both nostrils daily as needed prior to HBO treatment for difficulty clearing ears Radiology ordered were: MRI, left foot with and without contrast - Non healing ulcer on left fifth metatarsal, rule out Osteomyelitis, hx of  Osteomyelitis in left fifth metatarsal WOUND #5: - Metatarsal head fifth Wound Laterality: Left Peri-Wound Care: Zinc Oxide Ointment 30g tube 1 x Per Week/ Discharge Instructions: Apply Zinc Oxide to periwound with each dressing change Peri-Wound Care: Sween Lotion (Moisturizing lotion) 1 x Per Week/ Discharge Instructions: Apply moisturizing lotion as directed Prim Dressing: KerraCel Ag Gelling Fiber Dressing, 2x2 in (silver alginate) 1 x Per Week/ ary Discharge Instructions: Lightly pack undermining Secondary Dressing: Zetuvit Plus 4x8 in 1 x Per Week/ Discharge Instructions: Apply over  primary dressing as directed. Secondary Dressing: Optifoam Non-Adhesive Dressing, 4x4 in 1 x Per Week/ Discharge Instructions: Foam donut Com pression Wrap: ThreePress (3 layer compression wrap) 1 x Per Week/ Discharge Instructions: Apply three layer compression as directed. 1. I think the next step in the evaluation of this is a repeat MRI of the foot. If his osteomyelitis is worsened in spite of hyperbarics and antibiotics he may need a ray amputation. 2. I am less certain about the reason for the swelling in his leg. DVT rule out was negative.. I am going to put this in 3 layer compression. R calf measurements have not really changed that much but he does have more swelling in the ankle. He claims that his left and right leg were one-time equal in calf diameter. 3. I am going to continue existing antibiotics. It would not be impossible however to get a bone specimen here although there is no exposed bone Electronic Signature(s) Signed: 04/17/2020 7:23:52 PM By: Linton Ham MD Entered By: Linton Ham on 04/17/2020 08:30:03 -------------------------------------------------------------------------------- SuperBill Details Patient Name: Date of Service: Martin [name] 04/17/2020 Medical Record Number: [id number] Patient Account Number: [id number] Date of Birth/Sex: Treating RN: December 14, 1945 (74  y.o. [name] Primary Care Provider: [name] Other Clinician: Referring Provider: Treating Provider/Extender: [name] in Treatment: 16 Diagnosis Coding ICD-10 Codes Code Description [id number] Type 2 diabetes mellitus with foot ulcer L97.522 Non-pressure chronic ulcer of other part of left foot with fat layer exposed I10 Essential (primary) hypertension I25.10 Atherosclerotic heart disease of native coronary artery without angina pectoris M86.272 Subacute osteomyelitis, left ankle and foot L03.116 Cellulitis of left lower limb Facility Procedures CPT4 Code: [id number] Description: (Facility Use Only) [place] [facility] LEG Modifier: Quantity: 1 Electronic Signature(s) Signed: 04/17/2020 7:23:52 PM By: Linton Ham MD Entered By: Linton Ham on 04/17/2020 08:30:11

## 2020-04-18 ENCOUNTER — Encounter (HOSPITAL_BASED_OUTPATIENT_CLINIC_OR_DEPARTMENT_OTHER): Payer: PPO | Admitting: Internal Medicine

## 2020-04-18 ENCOUNTER — Other Ambulatory Visit: Payer: Self-pay

## 2020-04-18 DIAGNOSIS — E11621 Type 2 diabetes mellitus with foot ulcer: Secondary | ICD-10-CM | POA: Diagnosis not present

## 2020-04-18 DIAGNOSIS — L97522 Non-pressure chronic ulcer of other part of left foot with fat layer exposed: Secondary | ICD-10-CM | POA: Diagnosis not present

## 2020-04-18 LAB — GLUCOSE, CAPILLARY
Glucose-Capillary: 136 mg/dL — ABNORMAL HIGH (ref 70–99)
Glucose-Capillary: 185 mg/dL — ABNORMAL HIGH (ref 70–99)

## 2020-04-18 NOTE — Progress Notes (Signed)
ABENEZER, ODONELL ([id number]) Visit Report for 04/18/2020 Arrival Information Details Patient Name: Date of Service: Martin [name], Martin [name] 04/18/2020 10:00 A M Medical Record Number: [id number] Patient Account Number: [id number] Date of Birth/Sex: Treating RN: 09-Aug-1945 (74 y.o. [name], [name] Primary Care Epiphany Seltzer: [name] Other Clinician: [name] Referring Chau Savell: Treating Sharalee Witman/Extender: [name] in Treatment: 74 Visit Information History Since Last Visit Added or deleted any medications: No Patient Arrived: Martin [name] Any new allergies or adverse reactions: No Arrival Time: 09:50 Had a fall or experienced change in No Accompanied By: self activities of daily living that may affect Transfer Assistance: None risk of falls: Patient Identification Verified: Yes Signs or symptoms of abuse/neglect since last visito No Secondary Verification Process Completed: Yes Hospitalized since last visit: No Patient Requires Transmission-Based Precautions: No Implantable device outside of the clinic excluding No Patient Has Alerts: No cellular tissue based products placed in the center since last visit: Pain Present Now: No Electronic Signature(s) Signed: 04/18/2020 3:15:33 PM By: [name] EMT/HBOT/SD Entered By: [name] on 04/18/2020 11:24:39 -------------------------------------------------------------------------------- Encounter Discharge Information Details Patient Name: Date of Service: Martin Agee. 04/18/2020 10:00 A M Medical Record Number: [id number] Patient Account Number: [id number] Date of Birth/Sex: Treating RN: 08-Oct-1945 (74 y.o. [name], [name] Primary Care Rakim Moone: [name] Other Clinician: [name] Referring Treasure Ochs: Treating Hollyn Stucky/Extender: [name] in Treatment: 16 Encounter Discharge Information Items Discharge Condition: Stable Ambulatory Status:  Cane Discharge Destination: Home Transportation: Private Auto Accompanied By: self Schedule Follow-up Appointment: Yes Clinical Summary of Care: Patient Declined Electronic Signature(s) Signed: 04/18/2020 3:15:33 PM By: [name] EMT/HBOT/SD Entered By: [name] on 04/18/2020 13:47:47 -------------------------------------------------------------------------------- Patient/Caregiver Education Details Patient Name: Date of Service: Martin [name], [name] 12/21/2021andnbsp10:00 A M Medical Record Number: [id number] Patient Account Number: [id number] Date of Birth/Gender: Treating RN: 08-13-1945 (74 y.o. Erie Noe Primary Care Physician: [name] Other Clinician: [name] Referring Physician: Treating Physician/Extender: [name] in Treatment: 16 Education Assessment Education Provided To: Patient Education Topics Provided Hyperbaric Oxygenation: Methods: Explain/Verbal Responses: State content correctly Electronic Signature(s) Signed: 04/18/2020 3:15:33 PM By: [name] EMT/HBOT/SD Entered By: [name] on 04/18/2020 13:47:33 -------------------------------------------------------------------------------- Vitals Details Patient Name: Date of Service: Martin Agee. 04/18/2020 10:00 A M Medical Record Number: [id number] Patient Account Number: [id number] Date of Birth/Sex: Treating RN: [birth year] (74 y.o. [name], [name] Primary Care Aquita Simmering: [name] Other Clinician: [name] Referring Yanina Knupp: Treating Lonnetta Kniskern/Extender: [name] in Treatment: 16 Vital Signs Time Taken: 09:55 Temperature (F): 97.9 Height (in): 72 Pulse (bpm): 79 Weight (lbs): 270 Respiratory Rate (breaths/min): 16 Body Mass Index (BMI): 36.6 Blood Pressure (mmHg): 163/75 Capillary Blood Glucose (mg/dl): 136 Reference Range: 80 - 120 mg / dl Notes Pt given glucerna Electronic  Signature(s) Signed: 04/18/2020 3:15:33 PM By: [name] EMT/HBOT/SD Previous Signature: 04/18/2020 11:25:06 AM Version By: [name] EMT/HBOT/SD Entered By: [name] on 04/18/2020 11:25:12

## 2020-04-18 NOTE — Progress Notes (Addendum)
Martin, [name] ([id number]) Visit Report for 04/18/2020 HBO Details Patient Name: Date of Service: Martin, [name] 04/18/2020 10:00 A M Medical Record Number: [id number] Patient Account Number: [id number] Date of Birth/Sex: Treating RN: 05-10-1945 (74 y.o. Martin [name], Martin [name] Primary Care Martin [name]: Martin [name] Other Clinician: Mikeal [name] Referring Martin [name]: Treating Martin [name]/Extender: Martin [name] in Treatment: 16 HBO Treatment Course Details Treatment Course Number: 1 Ordering Martin [name]: Martin [name] T Treatments Ordered: otal 40 HBO Treatment Start Date: 02/22/2020 HBO Indication: Diabetic Ulcer(s) of the Lower Extremity HBO Treatment Details Treatment Number: 32 Patient Type: Outpatient Chamber Type: Monoplace Chamber Serial #: [id number] Treatment Protocol: 2.0 ATA with 90 minutes oxygen, and no air breaks Treatment Details Compression Rate Down: 2.0 psi / minute De-Compression Rate Up: 3.0 psi / minute Air breaks and breathing Decompress Decompress Compress Tx Pressure Begins Reached periods Begins Ends (leave unused spaces blank) Chamber Pressure (ATA 1 2 ------2 1 ) Clock Time (24 hr) 10:18 10:26 - - - - - - 11:56 12:04 Treatment Length: 106 (minutes) Treatment Segments: 4 Vital Signs Capillary Blood Glucose Reference Range: 80 - 120 mg / dl HBO Diabetic Blood Glucose Intervention Range: <131 mg/dl or >249 mg/dl Time Vitals Blood Respiratory Capillary Blood Glucose Pulse Action Type: Pulse: Temperature: Taken: Pressure: Rate: Glucose (mg/dl): Meter #: Oximetry (%) Taken: Pre 09:55 163/75 79 16 97.9 136 Post 12:06 154/78 75 16 98.2 185 Treatment Response Treatment Toleration: Well Treatment Completion Status: Treatment Completed without Adverse Event Additional Procedure Documentation Tissue Sevierity: Necrosis of bone Tran Randle Notes No concerns with treatment given Physician HBO Attestation: I certify that I  supervised this HBO treatment in accordance with Medicare guidelines. A trained emergency response team is readily available per Yes hospital policies and procedures. Continue HBOT as ordered. Yes Electronic Signature(s) Signed: 04/18/2020 5:57:42 PM By: Linton Ham MD Previous Signature: 04/18/2020 3:15:33 PM Version By: Martin [name] EMT/HBOT/SD Previous Signature: 04/18/2020 3:15:33 PM Version By: Martin [name] EMT/HBOT/SD Entered By: Linton Ham on 04/18/2020 17:56:14 -------------------------------------------------------------------------------- HBO Safety Checklist Details Patient Name: Date of Service: Martin [name]. 04/18/2020 10:00 A M Medical Record Number: [id number] Patient Account Number: [id number] Date of Birth/Sex: Treating RN: 24-Feb-1946 (74 y.o. Martin [name], Martin [name] Primary Care Martin [name]: Martin [name] Other Clinician: Mikeal [name] Referring Martin [name]: Treating Martin [name]/Extender: Martin [name] in Treatment: 16 HBO Safety Checklist Items Safety Checklist Consent Form Signed Patient voided / foley secured and emptied When did you last eato 1000 - glucerna Last dose of injectable or oral agent insulin Ostomy pouch emptied and vented if applicable NA All implantable devices assessed, documented and approved NA Intravenous access site secured and place NA Valuables secured Linens and cotton and cotton/polyester blend (less than 51% polyester) Personal oil-based products / skin lotions / body lotions removed Wigs or hairpieces removed NA Smoking or tobacco materials removed NA Books / newspapers / magazines / loose paper removed Cologne, aftershave, perfume and deodorant removed Jewelry removed (may wrap wedding band) Make-up removed NA Hair care products removed Battery operated devices (external) removed NA Heating patches and chemical warmers removed NA Titanium eyewear removed NA Nail polish cured greater  than 10 hours NA Casting material cured greater than 10 hours NA Hearing aids removed NA Loose dentures or partials removed NA Prosthetics have been removed NA Patient demonstrates correct use of air break device (if applicable) Patient concerns have been addressed Patient grounding bracelet on and cord attached to chamber Specifics for Inpatients (complete in addition to above) Medication  sheet sent with patient Intravenous medications needed or due during therapy sent with patient Drainage tubes (e.g. nasogastric tube or chest tube secured and vented) Endotracheal or Tracheotomy tube secured Cuff deflated of air and inflated with saline Airway suctioned Electronic Signature(s) Signed: 04/18/2020 11:25:58 AM By: Martin [name] EMT/HBOT/SD Entered By: Martin [name] on 04/18/2020 11:25:57

## 2020-04-18 NOTE — Progress Notes (Signed)
LYDEN, REDNER ([id number]) Visit Report for 04/18/2020 SuperBill Details Patient Name: Date of Service: GLYNDON, TURSI 04/18/2020 Medical Record Number: [id number] Patient Account Number: [id number] Date of Birth/Sex: Treating RN: Apr 13, 1946 (74 y.o. 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

## 2020-04-19 ENCOUNTER — Other Ambulatory Visit: Payer: Self-pay

## 2020-04-19 ENCOUNTER — Encounter (HOSPITAL_BASED_OUTPATIENT_CLINIC_OR_DEPARTMENT_OTHER): Payer: PPO | Admitting: Physician Assistant

## 2020-04-19 DIAGNOSIS — L97522 Non-pressure chronic ulcer of other part of left foot with fat layer exposed: Secondary | ICD-10-CM | POA: Diagnosis not present

## 2020-04-19 DIAGNOSIS — E11621 Type 2 diabetes mellitus with foot ulcer: Secondary | ICD-10-CM | POA: Diagnosis not present

## 2020-04-19 LAB — GLUCOSE, CAPILLARY
Glucose-Capillary: 118 mg/dL — ABNORMAL HIGH (ref 70–99)
Glucose-Capillary: 144 mg/dL — ABNORMAL HIGH (ref 70–99)
Glucose-Capillary: 157 mg/dL — ABNORMAL HIGH (ref 70–99)

## 2020-04-19 NOTE — Progress Notes (Signed)
EDKER, PUNT ([id number]) Visit Report for 04/19/2020 Arrival Information Details Patient Name: Date of Service: PRATHER, Martin [name] 04/19/2020 8:00 A M Medical Record Number: [id number] Patient Account Number: [id number] Date of Birth/Sex: Treating RN: 1946/01/25 (74 y.o. Martin [name], [name] Primary Care Annlouise Gerety: [name] Other Clinician: [name] Referring Marisol Glazer: Treating Bearl Talarico/Extender: Sandria Bales in Treatment: 32 Visit Information History Since Last Visit Added or deleted any medications: No Patient Arrived: [name] Any new allergies or adverse reactions: No Arrival Time: 08:10 Had a fall or experienced change in No Accompanied By: self activities of daily living that may affect Transfer Assistance: None risk of falls: Patient Identification Verified: Yes Signs or symptoms of abuse/neglect since last visito No Secondary Verification Process Completed: Yes Hospitalized since last visit: No Patient Requires Transmission-Based Precautions: No Implantable device outside of the clinic excluding No Patient Has Alerts: No cellular tissue based products placed in the center since last visit: Pain Present Now: No Electronic Signature(s) Signed: 04/19/2020 4:36:38 PM By: [name] EMT/HBOT/SD Entered By: [name] on 04/19/2020 09:10:59 -------------------------------------------------------------------------------- Encounter Discharge Information Details Patient Name: Date of Service: Martin [name]. 04/19/2020 8:00 A M Medical Record Number: [id number] Patient Account Number: [id number] Date of Birth/Sex: Treating RN: 08-27-1945 (74 y.o. Martin [name] Primary Care Kitzia Camus: [name] Other Clinician: [name] Referring Chidinma Clites: Treating Saajan Willmon/Extender: Sandria Bales in Treatment: 17 Encounter Discharge Information Items Discharge Condition: Stable Ambulatory Status:  Cane Discharge Destination: Home Transportation: Private Auto Accompanied By: self Schedule Follow-up Appointment: Yes Clinical Summary of Care: Patient Declined Electronic Signature(s) Signed: 04/19/2020 4:36:38 PM By: [name] EMT/HBOT/SD Entered By: [name] on 04/19/2020 16:36:21 -------------------------------------------------------------------------------- Patient/Caregiver Education Details Patient Name: Date of Service: [name], [name] 12/22/2021andnbsp8:00 A M Medical Record Number: [id number] Patient Account Number: [id number] Date of Birth/Gender: Treating RN: 10-11-1945 (74 y.o. Martin [name] Primary Care Physician: [name] Other Clinician: [name] Referring Physician: Treating Physician/Extender: Sandria Bales in Treatment: 61 Education Assessment Education Provided To: Patient Education Topics Provided Hyperbaric Oxygenation: Methods: Explain/Verbal Responses: State content correctly Electronic Signature(s) Signed: 04/19/2020 4:36:38 PM By: [name] EMT/HBOT/SD Entered By: [name] on 04/19/2020 16:36:09 -------------------------------------------------------------------------------- Vitals Details Patient Name: Date of Service: Martin [name]. 04/19/2020 8:00 A M Medical Record Number: [id number] Patient Account Number: [id number] Date of Birth/Sex: Treating RN: 04-Jun-1945 (74 y.o. Martin [name] Primary Care Naw Lasala: [name] Other Clinician: [name] Referring Karlisha Mathena: Treating Christina Gintz/Extender: Sandria Bales in Treatment: 17 Vital Signs Time Taken: 08:15 Temperature (F): 98.1 Height (in): 72 Pulse (bpm): 75 Weight (lbs): 270 Respiratory Rate (breaths/min): 16 Body Mass Index (BMI): 36.6 Blood Pressure (mmHg): 128/67 Capillary Blood Glucose (mg/dl): 144 Reference Range: 80 - 120 mg / dl Electronic Signature(s) Signed: 04/19/2020  4:36:38 PM By: [name] EMT/HBOT/SD Entered By: [name] on 04/19/2020 09:11:02

## 2020-04-19 NOTE — Progress Notes (Signed)
Martin [name] ([id number]) Visit Report for 04/19/2020 Arrival Information Details Patient Name: Date of Service: Martin [name], Martin [name] 04/19/2020 10:15 A M Medical Record Number: [id number] Patient Account Number: [id number] Date of Birth/Sex: Treating RN: Sep 05, 1945 (74 y.o. Martin [name] Primary Care Theadore Blunck: [name] Other Clinician: Referring Wrenly Lauritsen: Treating Thadeus Gandolfi/Extender: Sandria Bales in Treatment: 82 Visit Information History Since Last Visit Added or deleted any medications: No Patient Arrived: Martin [name] Any new allergies or adverse reactions: No Arrival Time: 10:50 Had a fall or experienced change in No Accompanied By: self activities of daily living that may affect Transfer Assistance: None risk of falls: Patient Identification Verified: Yes Signs or symptoms of abuse/neglect since last visito No Secondary Verification Process Completed: Yes Hospitalized since last visit: No Patient Requires Transmission-Based Precautions: No Implantable device outside of the clinic excluding No Patient Has Alerts: No cellular tissue based products placed in the center since last visit: Pain Present Now: No Electronic Signature(s) Signed: 04/19/2020 4:36:38 PM By: [name] EMT/HBOT/SD Entered By: [name] on 04/19/2020 10:49:24 -------------------------------------------------------------------------------- Compression Therapy Details Patient Name: Date of Service: Martin [name]. 04/19/2020 10:15 A M Medical Record Number: [id number] Patient Account Number: [id number] Date of Birth/Sex: Treating RN: 07/04/1945 (74 y.o. Martin [name] Primary Care Chesni Vos: [name] Other Clinician: Referring Arnoldo Hildreth: Treating Takashi Korol/Extender: Sandria Bales in Treatment: 17 Compression Therapy Performed for Wound Assessment: Wound #5 Left Metatarsal head fifth Performed By: Clinician [name],  RN Compression Type: Three Layer Post Procedure Diagnosis Same as Pre-procedure Electronic Signature(s) Signed: 04/19/2020 7:00:57 PM By: [name] RN, BSN Entered By: [name] on 04/19/2020 11:53:56 -------------------------------------------------------------------------------- Encounter Discharge Information Details Patient Name: Date of Service: Martin [name]. 04/19/2020 10:15 A M Medical Record Number: [id number] Patient Account Number: [id number] Date of Birth/Sex: Treating RN: 01-07-1946 (74 y.o. Martin [name] Primary Care Sophea Rackham: [name] Other Clinician: Referring Jannely Henthorn: Treating Brionne Mertz/Extender: Sandria Bales in Treatment: 17 Encounter Discharge Information Items Discharge Condition: Stable Ambulatory Status: Cane Discharge Destination: Home Transportation: Private Auto Accompanied By: self Schedule Follow-up Appointment: Yes Clinical Summary of Care: Patient Declined Electronic Signature(s) Signed: 04/19/2020 5:14:06 PM By: Carlene Coria RN Entered By: Carlene Coria on 04/19/2020 12:23:03 -------------------------------------------------------------------------------- Lower Extremity Assessment Details Patient Name: Date of Service: Martin [name] 04/19/2020 10:15 A M Medical Record Number: [id number] Patient Account Number: [id number] Date of Birth/Sex: Treating RN: [birth year] (74 y.o. Martin [name] Primary Care Martin [name]: [name] Other Clinician: Referring Carizma Dunsworth: Treating Zianna Dercole/Extender: Sandria Bales in Treatment: 17 Edema Assessment Assessed: Martin [name]: No] Martin [name]: No] Edema: [Left: Ye] [Right: s] Calf Left: Right: Point of Measurement: 32 cm From Medial Instep 41 cm Ankle Left: Right: Point of Measurement: 11 cm From Medial Instep 30 cm Vascular Assessment Pulses: Dorsalis Pedis Palpable: [Left:Yes] Electronic Signature(s) Signed: 04/19/2020 4:36:38 PM  By: [name] EMT/HBOT/SD Signed: 04/19/2020 7:00:57 PM By: [name] RN, BSN Entered By: [name] on 04/19/2020 11:08:29 -------------------------------------------------------------------------------- Multi-Disciplinary Care Plan Details Patient Name: Date of Service: Martin [name]. 04/19/2020 10:15 A M Medical Record Number: [id number] Patient Account Number: [id number] Date of Birth/Sex: Treating RN: 1945-06-02 (74 y.o. Martin [name] Primary Care Savanna Dooley: [name] Other Clinician: Referring Ady Heimann: Treating Kamarri Fischetti/Extender: Sandria Bales in Treatment: 17 Active Inactive Nutrition Nursing Diagnoses: Impaired glucose control: actual or potential Potential for alteratiion in Nutrition/Potential for imbalanced nutrition Goals: Patient/caregiver will maintain therapeutic glucose control Date Initiated: 12/22/2019  Target Resolution Date: 05/19/2020 Goal Status: Active Interventions: Assess HgA1c results as ordered upon admission and as needed Treatment Activities: Patient referred to Primary Care Physician for further nutritional evaluation : 12/22/2019 Notes: Soft Tissue Infection Nursing Diagnoses: Impaired tissue integrity Potential for infection: soft tissue Goals: Patient's soft tissue infection will resolve Date Initiated: 03/29/2020 Target Resolution Date: 05/19/2020 Goal Status: Active Interventions: Assess signs and symptoms of infection every visit Provide education on infection Treatment Activities: Culture and sensitivity : 03/29/2020 Education provided on Infection : 04/12/2020 Systemic antibiotics : 03/29/2020 T ordered outside of clinic : 03/29/2020 est Notes: Wound/Skin Impairment Nursing Diagnoses: Impaired tissue integrity Knowledge deficit related to ulceration/compromised skin integrity Goals: Patient/caregiver will verbalize understanding of skin care regimen Date Initiated: 12/22/2019 Target  Resolution Date: 05/10/2020 Goal Status: Active Ulcer/skin breakdown will have a volume reduction of 30% by week 4 Date Initiated: 12/22/2019 Date Inactivated: 01/19/2020 Target Resolution Date: 01/19/2020 Goal Status: Unmet Unmet Reason: infection Ulcer/skin breakdown will have a volume reduction of 50% by week 8 Date Initiated: 01/19/2020 Date Inactivated: 02/16/2020 Target Resolution Date: 02/16/2020 Goal Status: Unmet Unmet Reason: infection Ulcer/skin breakdown will have a volume reduction of 80% by week 12 Date Initiated: 02/16/2020 Date Inactivated: 03/15/2020 Target Resolution Date: 03/15/2020 Goal Status: Unmet Unmet Reason: osteo, getting HBO Interventions: Assess patient/caregiver ability to obtain necessary supplies Assess patient/caregiver ability to perform ulcer/skin care regimen upon admission and as needed Assess ulceration(s) every visit Provide education on ulcer and skin care Treatment Activities: Skin care regimen initiated : 12/22/2019 Topical wound management initiated : 12/22/2019 Notes: Electronic Signature(s) Signed: 04/19/2020 7:00:57 PM By: [name] RN, BSN Entered By: [name] on 04/19/2020 11:52:59 -------------------------------------------------------------------------------- Pain Assessment Details Patient Name: Date of Service: Martin [name]. 04/19/2020 10:15 A M Medical Record Number: [id number] Patient Account Number: [id number] Date of Birth/Sex: Treating RN: September 28, 1945 (74 y.o. Martin [name] Primary Care Kenric Ginger: [name] Other Clinician: Referring Melesa Lecy: Treating Kayanna Mckillop/Extender: Sandria Bales in Treatment: 17 Active Problems Location of Pain Severity and Description of Pain Patient Has Paino No Site Locations With Dressing Change: No Pain Management and Medication Current Pain Management: Electronic Signature(s) Signed: 04/19/2020 4:36:38 PM By: [name]  EMT/HBOT/SD Signed: 04/19/2020 7:00:57 PM By: [name] RN, BSN Entered By: [name] on 04/19/2020 10:50:54 -------------------------------------------------------------------------------- Patient/Caregiver Education Details Patient Name: Date of Service: Martin [name] 12/22/2021andnbsp10:15 A M Medical Record Number: [id number] Patient Account Number: [id number] Date of Birth/Gender: Treating RN: 05/05/1945 (74 y.o. Martin [name] Primary Care Physician: [name] Other Clinician: Referring Physician: Treating Physician/Extender: Sandria Bales in Treatment: 17 Education Assessment Education Provided To: Patient Education Topics Provided Wound/Skin Impairment: Methods: Explain/Verbal Responses: State content correctly [employer]) Signed: 04/19/2020 7:00:57 PM By: [name] RN, BSN Entered By: [name] on 04/19/2020 11:53:11 -------------------------------------------------------------------------------- Wound Assessment Details Patient Name: Date of Service: Martin [name]. 04/19/2020 10:15 A M Medical Record Number: [id number] Patient Account Number: [id number] Date of Birth/Sex: Treating RN: 16-Dec-1945 (74 y.o. Martin [name] Primary Care Shelbee Apgar: [name] Other Clinician: Referring Durelle Zepeda: Treating Leng Montesdeoca/Extender: Sandria Bales in Treatment: 17 Wound Status Wound Number: 5 Primary Diabetic Wound/Ulcer of the Lower Extremity Etiology: Wound Location: Left Metatarsal head fifth Wound Open Wounding Event: Gradually Appeared Status: Date Acquired: 10/31/2019 Comorbid Cataracts, Coronary Artery Disease, Hypertension, Type II Weeks Of Treatment: 17 History: Diabetes, Neuropathy Clustered Wound: No Wound Measurements Length: (cm) 1.8 Width: (cm) 0.9 Depth: (cm) 0.2 Area: (cm)  1.272 Volume: (cm) 0.254 % Reduction in Area: -5.1% % Reduction in Volume:  70% Epithelialization: Medium (34-66%) Tunneling: No Undermining: Yes Starting Position (o'clock): 4 Ending Position (o'clock): 11 Maximum Distance: (cm) 0.7 Wound Description Classification: Grade 3 Wound Margin: Well defined, not attached Exudate Amount: Medium Exudate Type: Serosanguineous Exudate Color: red, brown Foul Odor After Cleansing: No Slough/Fibrino Yes Wound Bed Granulation Amount: Large (67-100%) Exposed Structure Granulation Quality: Pink Fascia Exposed: No Necrotic Amount: Small (1-33%) Fat Layer (Subcutaneous Tissue) Exposed: Yes Necrotic Quality: Adherent Slough Tendon Exposed: No Muscle Exposed: No Joint Exposed: No Bone Exposed: No Treatment Notes Wound #5 (Metatarsal head fifth) Wound Laterality: Left Cleanser Peri-Wound Care Zinc Oxide Ointment 30g tube Discharge Instruction: Apply Zinc Oxide to periwound with each dressing change Sween Lotion (Moisturizing lotion) Discharge Instruction: Apply moisturizing lotion as directed Topical Primary Dressing KerraCel Ag Gelling Fiber Dressing, 2x2 in (silver alginate) Discharge Instruction: Lightly pack undermining Secondary Dressing Zetuvit Plus 4x8 in Discharge Instruction: Apply Zetuvit or Drawtex over primary dressing as directed. Optifoam Non-Adhesive Dressing, 4x4 in Discharge Instruction: Foam donut Secured With Compression Wrap ThreePress (3 layer compression wrap) Discharge Instruction: Apply three layer compression as directed. Compression Stockings Add-Ons Electronic Signature(s) Signed: 04/19/2020 4:36:38 PM By: [name] EMT/HBOT/SD Signed: 04/19/2020 7:00:57 PM By: [name] RN, BSN Entered By: [name] on 04/19/2020 11:06:54 -------------------------------------------------------------------------------- Vitals Details Patient Name: Date of Service: Martin [name]. 04/19/2020 10:15 A M Medical Record Number: [id number] Patient Account Number: [id number] Date of  Birth/Sex: Treating RN: 1945/12/21 (74 y.o. Martin [name] Primary Care Lynard Postlewait: [name] Other Clinician: [name] Referring Ralpheal Zappone: Treating Kolt Mcwhirter/Extender: Sandria Bales in Treatment: 17 Vital Signs Time Taken: 10:50 Temperature (F): 98.1 Height (in): 72 Pulse (bpm): 75 Weight (lbs): 270 Respiratory Rate (breaths/min): 16 Body Mass Index (BMI): 36.6 Blood Pressure (mmHg): 128/67 Reference Range: 80 - 120 mg / dl Electronic Signature(s) Signed: 04/19/2020 4:36:38 PM By: [name] EMT/HBOT/SD Entered By: [name] on 04/19/2020 10:49:55

## 2020-04-19 NOTE — Progress Notes (Signed)
[name], CUFF ([id number]) Visit Report for 04/19/2020 SuperBill Details Patient Name: Date of Service: Martin [name], Martin [name] 04/19/2020 Medical Record Number: [id number] Patient Account Number: [id number] Date of Birth/Sex: Treating RN: 1945-05-30 (73 y.o. 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

## 2020-04-19 NOTE — Progress Notes (Signed)
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 have less undermining and overall I think between the cast and now the start of the hyperbaric oxygen therapy he has a very good chance of getting this wound to heal. Fortunately there is no signs of active infection  at this time which is great news. No fevers, chills, nausea, vomiting, or diarrhea. He does note that he has been having some issues with the linezolid causing him to be nauseated and he also states that it is caused him to lose his smell and taste. With that being said I am really not certain that this is indeed the reason behind this but either way I think we can switch the antibiotic being that he is looking so well with minimal linezolid for close to 3-4 weeks at this point. We will do 2 weeks of Bactrim and then hopefully he should be complete with the antibiotic courses. 03/01/2020 on evaluation today patient is making good progress in regard to his wound. This is measuring smaller today and overall very pleased. The antibiotics which has seemed to help him he states that he is having a lot of improvement overall in his smell and taste as well as improvement in the overall nausea that he was experiencing with the linezolid. Obviously this is great news. 03/08/2020 upon evaluation today patient appears to be doing well with regard to his foot ulcer. I feel like this is showing signs of improvement it  is measuring a little bit better today compared to previous. With that being said there is no evidence of active infection which is great news and in general I feel like the hyperbarics is helping him along with the antibiotics which she will be completing I believe in the next week. Outside of this also feel like that the total contact cast obviously has been of benefit. 03/15/2020 on evaluation today patient appears to be doing well in regard to his foot ulcer. He has been tolerating the dressing changes without complication. Fortunately there is no signs of active infection at this time. No fevers, chills, nausea, vomiting, or diarrhea. Patient is tolerating hyperbarics quite well at this time and I see no signs of infection currently which is great news. 03/22/2020 on evaluation today patient  appears to be doing about the same in regard to his foot ulcer. He does have some tissue along the medial portion of the wound bed and I am unsure of exactly what this is just characteristically. It could be a small amount of tendon here to be honest. With that being said I do think it needs to be removed as I feel like it is hindering his healing possibilities. 11/29; acute visit; I was asked to see the patient urgently today because of complaints of pain in his foot. He is a wound in the right fifth metatarsal head plantar aspect with underlying osteomyelitis. He has been undergoing hyperbaric oxygen treatment completing antibiotics in the middle of this month. He said the pain had increased to the point that it was becoming difficult for him to walk on his foot. He has not been systemically unwell.. He had a fairly significant debridement on his last visit 5 days ago. 03/29/2020 on evaluation today patient appears to be doing much better than he was on Monday according to the pictures and what I saw. Fortunately there is no signs of active infection at this time which is great news. There is no evidence of systemic infection either. Overall I feel like the patient is doing indeed much better and is just a matter of having him continue the antibiotic for the time being in my opinion and then depending on the results of the culture we may extend that for a bit longer. 12/10; I am seeing the patient today because of difficulty had this week with his wife's illness. He could not come on Wednesday. The wound looks a lot better than what I usually am used to seeing. He is using silver alginate and a total contact cast continuing with HBO. He has completed his antibiotics cultures and x-rays were negative 04/12/2020 upon evaluation today patient unfortunately appears to be doing a little worse in regard to his wound from the standpoint of erythema surrounding. There does not appear to be any signs of  active systemic infection which is great news. Nonetheless I am concerned about the fact that if we put him back in the cast he will not be able to visually see what was going on in regard to his foot and if anything is worsening he would have no idea into it could be potentially too late. 12/17; surrounding his dive yesterday afternoon it was brought to my attention about the extent of swelling in the left leg. I really did not have enough time to fully evaluate this before the end of his treatment so I arranged to have a duplex ultrasound done rule out DVT which thankfully turned out to be negative. I  note that he was felt to have cellulitis of the left foot surrounding his wound when he was seen on Wednesday by Jeri Cos and was put on trimethoprim sulfamethoxazole. His report showed no evidence of a DVT or SVT . I am seeing him today in follow-up of this. As it turns out his calf measurements have not really changed that much but he has much more swelling in the ankle. It could be because we did not put a total contact cast back on him. I think he has some degree of chronic venous insufficiency. 12/20; I wanted to look at the patient's leg when he was up on the table before he went into hyperbarics however he was put on the schedule today. He is still taking trimethoprim sulfamethoxazole. Culture that was done last Wednesday is negative. We put him in kerlix Coban wrap over the weekend still a lot of swelling in this leg. DVT rule out was negative there is no swelling in his thigh not sure I really have a good explanation for this. He is not complaining of systemic symptoms 04/19/2020 upon evaluation today patient appears to be doing somewhat poorly in regard to his foot ulcer. Again he has seen Dr. Dellia Nims Dr. Dellia Nims did order repeat MRI after I agree with this and think that is the right way to go at this point. Depending on the results of the MRI I discussed with the patient today that we may  be looking at a referral potentially for surgery consultation if this occurs the patient would like to see someone for second opinion other than Dr. Sharol Given I understand and I think we can definitely do that I have never discourage patients from second opinions. With that being said currently I do not see any evidence of worsening infection though there still appears to be significant erythema. The patient's culture did show that he is on the appropriate antibiotic with the Bactrim DS which I did prescribe for him last week he has another week of this remaining. I think he can continue to take that for the time being. Objective Constitutional Well-nourished and well-hydrated in no acute distress. Vitals Time Taken: 10:50 AM, Height: 72 in, Weight: 270 lbs, BMI: 36.6, Temperature: 98.1 F, Pulse: 75 bpm, Respiratory Rate: 16 breaths/min, Blood Pressure: 128/67 mmHg. Respiratory normal breathing without difficulty. Psychiatric this patient is able to make decisions and demonstrates good insight into disease process. Alert and Oriented x 3. pleasant and cooperative. General Notes: Upon inspection patient's wound bed actually showed signs of fairly good granulation at this time and some areas although there also appears to be significant erythema surrounding the wound bed that has me concerned obviously. Fortunately there is no evidence of active infection systemically at this time. Integumentary (Hair, Skin) Wound #5 status is Open. Original cause of wound was Gradually Appeared. The wound is located on the Left Metatarsal head fifth. The wound measures 1.8cm length x 0.9cm width x 0.2cm depth; 1.272cm^2 area and 0.254cm^3 volume. There is Fat Layer (Subcutaneous Tissue) exposed. There is no tunneling noted, however, there is undermining starting at 4:00 and ending at 11:00 with a maximum distance of 0.7cm. There is a medium amount of serosanguineous drainage noted. The wound margin is well defined  and not attached to the wound base. There is large (67-100%) pink granulation within the wound bed. There is a small (1-33%) amount of necrotic tissue within the wound bed including Adherent Slough. 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 There was a Three Layer Compression Therapy Procedure by Zandra Abts, RN. Post procedure Diagnosis Wound #5: Same as Pre-Procedure Plan Follow-up Appointments: Return Appointment in 1 week. - Tuesday 12/28 Bathing/ Shower/ Hygiene: May shower with protection but do not get wound dressing(s) wet. 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 Follow Hyperbaric Oxygen Glycemia Protocol Afrin (Oxymetazoline HCL) 0.05% nasal spray - 1 spray in both nostrils daily as needed prior to HBO treatment for difficulty clearing ears WOUND #5: - Metatarsal head fifth Wound Laterality: Left Peri-Wound Care: Zinc Oxide Ointment 30g tube 1 x Per Week/ Discharge Instructions: Apply Zinc Oxide to periwound with each dressing change Peri-Wound Care: Sween Lotion (Moisturizing lotion) 1 x Per  Week/ Discharge Instructions: Apply moisturizing lotion as directed Prim Dressing: KerraCel Ag Gelling Fiber Dressing, 2x2 in (silver alginate) 1 x Per Week/ ary Discharge Instructions: Lightly pack undermining Secondary Dressing: Zetuvit Plus 4x8 in 1 x Per Week/ Discharge Instructions: Apply Zetuvit or Drawtex over primary dressing as directed. Secondary Dressing: Optifoam Non-Adhesive Dressing, 4x4 in 1 x Per Week/ Discharge Instructions: Foam donut Com pression Wrap: ThreePress (3 layer compression wrap) 1 x Per Week/ Discharge Instructions: Apply three layer compression as directed. 1. Would recommend currently that we continue with the wound care measures as before specifically with regard to the wedge offloading shoe to try to keep pressure off of the wound is much as possible. 2. I am also can recommend that we have the patient continue with the silver alginate dressing which I think is going to Doing well in general. 3. I would also recommend a 3 layer compression wrap we continue to try to keep edema under good control he seems to be tolerating that well today. 4. I did discuss with the patient depending on the results of the MRI we may be discussing next week the possibility of referral for second opinion to see another surgeon that way he can at least be aware of what is going on. We will see patient back for reevaluation in 1 week here in the clinic. If anything worsens or changes patient will contact our office for additional recommendations. Electronic Signature(s) Signed: 04/19/2020 5:35:00 PM By: Lenda Kelp PA-C Entered By: Lenda Kelp on 04/19/2020 17:35:00 -------------------------------------------------------------------------------- SuperBill Details Patient Name: Date of Service: Martin [name], Martin [name] 04/19/2020 Medical Record Number: [id number] Patient Account Number: [id number] Date of Birth/Sex: Treating RN: [birth year] (74 y.o. Elizebeth Koller Primary Care  Provider: Tana Conch Other Clinician: Referring Provider: Treating Provider/Extender: Brunetta Jeans in Treatment: 17 Diagnosis Coding ICD-10 Codes Code Description [phone] Type 2 diabetes mellitus with foot ulcer L97.522 Non-pressure chronic ulcer of other part of left foot with fat layer exposed I10 Essential (primary) hypertension  I25.10 Atherosclerotic heart disease of native coronary artery without angina pectoris M86.272 Subacute osteomyelitis, left ankle and foot L03.116 Cellulitis of left lower limb Facility Procedures CPT4 Code: [id number] Description: (Facility Use Only) [phone] - Friona COMPRS LWR LT LEG Modifier: Quantity: 1 Physician Procedures : CPT4 Code Description Modifier [id number] - WC PHYS LEVEL 3 - EST PT ICD-10 Diagnosis Description E11.621 Type 2 diabetes mellitus with foot ulcer L97.522 Non-pressure chronic ulcer of other part of left foot with fat layer exposed I10 Essential  (primary) hypertension I25.10 Atherosclerotic heart disease of native coronary artery without angina pectoris Quantity: 1 Electronic Signature(s) Signed: 04/19/2020 5:35:12 PM By: Worthy Keeler PA-C Previous Signature: 04/19/2020 5:13:59 PM Version By: Worthy Keeler PA-C Entered By: Worthy Keeler on 04/19/2020 17:35:12

## 2020-04-19 NOTE — Progress Notes (Signed)
Martin, [name] ([id number]) Visit Report for 04/19/2020 HBO Details Patient Name: Date of Service: Martin [name], Martin [name] 04/19/2020 8:00 A M Medical Record Number: [id number] Patient Account Number: [id number] Date of Birth/Sex: Treating RN: 1945/08/27 (74 y.o. [name] Primary Care Pauline Trainer: [name] Other Clinician: [name] Referring Deina Lipsey: Treating Govind Furey/Extender: Sandria Bales in Treatment: 17 HBO Treatment Course Details Treatment Course Number: 1 Ordering Mildred Bollard: Worthy Keeler T Treatments Ordered: otal 40 HBO Treatment Start Date: 02/22/2020 HBO Indication: Diabetic Ulcer(s) of the Lower Extremity HBO Treatment Details Treatment Number: 33 Patient Type: Outpatient Chamber Type: Monoplace Chamber Serial #: [id number] Treatment Protocol: 2.0 ATA with 90 minutes oxygen, and no air breaks Treatment Details Compression Rate Down: 2.0 psi / minute De-Compression Rate Up: 2.0 psi / minute Air breaks and breathing Decompress Decompress Compress Tx Pressure Begins Reached periods Begins Ends (leave unused spaces blank) Chamber Pressure (ATA 1 2 ------2 1 ) Clock Time (24 hr) 08:38 08:46 - - - - - - 10:16 10:24 Treatment Length: 106 (minutes) Treatment Segments: 4 Vital Signs Capillary Blood Glucose Reference Range: 80 - 120 mg / dl HBO Diabetic Blood Glucose Intervention Range: <131 mg/dl or >249 mg/dl Time Vitals Blood Respiratory Capillary Blood Glucose Pulse Action Type: Pulse: Temperature: Taken: Pressure: Rate: Glucose (mg/dl): Meter #: Oximetry (%) Taken: Pre 08:15 128/67 75 16 98.1 144 Post 10:26 159/80 70 18 98 157 Treatment Response Treatment Toleration: Well Treatment Completion Status: Treatment Completed without Adverse Event Additional Procedure Documentation Tissue Sevierity: Necrosis of bone Electronic Signature(s) Signed: 04/19/2020 4:36:38 PM By: [name] EMT/HBOT/SD Signed: 04/19/2020  5:13:59 PM By: Worthy Keeler PA-C Entered By: [name] on 04/19/2020 16:35:47 -------------------------------------------------------------------------------- HBO Safety Checklist Details Patient Name: Date of Service: Martin [name]. 04/19/2020 8:00 A M Medical Record Number: [id number] Patient Account Number: [id number] Date of Birth/Sex: Treating RN: 12-Apr-1946 (74 y.o. [name] Primary Care Emogene Muratalla: [name] Other Clinician: [name] Referring Jaidev Sanger: Treating Kaylla Cobos/Extender: Sandria Bales in Treatment: 17 HBO Safety Checklist Items Safety Checklist Consent Form Signed Patient voided / foley secured and emptied When did you last eato 0830 - glucerna Last dose of injectable or oral agent insulin Ostomy pouch emptied and vented if applicable NA All implantable devices assessed, documented and approved NA Intravenous access site secured and place NA Valuables secured Linens and cotton and cotton/polyester blend (less than 51% polyester) Personal oil-based products / skin lotions / body lotions removed Wigs or hairpieces removed NA Smoking or tobacco materials removed NA Books / newspapers / magazines / loose paper removed Cologne, aftershave, perfume and deodorant removed Jewelry removed (may wrap wedding band) Make-up removed NA Hair care products removed Battery operated devices (external) removed NA Heating patches and chemical warmers removed NA Titanium eyewear removed NA Nail polish cured greater than 10 hours NA Casting material cured greater than 10 hours NA Hearing aids removed NA Loose dentures or partials removed NA Prosthetics have been removed NA Patient demonstrates correct use of air break device (if applicable) Patient concerns have been addressed Patient grounding bracelet on and cord attached to chamber Specifics for Inpatients (complete in addition to above) Medication sheet  sent with patient Intravenous medications needed or due during therapy sent with patient Drainage tubes (e.g. nasogastric tube or chest tube secured and vented) Endotracheal or Tracheotomy tube secured Cuff deflated of air and inflated with saline Airway suctioned Electronic Signature(s) Signed: 04/19/2020 9:09:04 AM By: [name] EMT/HBOT/SD Entered  ByMikeal Hawthorne on 04/19/2020 09:09:03

## 2020-04-19 NOTE — Progress Notes (Signed)
AYBEL, TUMA ([id number]) Visit Report for 04/17/2020 Arrival Information Details Patient Name: Date of Service: Martin [name], Martin [name] 04/17/2020 7:30 A M Medical Record Number: [id number] Patient Account Number: [id number] Date of Birth/Sex: Treating RN: [birth year] (74 y.o. Martin [name], Martin [name] Primary Care Martin [name]: Martin [name] Other Clinician: Referring Martin [name]: Treating Martin [name]/Extender: Martin [name] in Treatment: 33 Visit Information History Since Last Visit Added or deleted any medications: No Patient Arrived: Martin [name] Any new allergies or adverse reactions: No Arrival Time: 07:50 Had a fall or experienced change in No Accompanied By: self activities of daily living that may affect Transfer Assistance: None risk of falls: Patient Identification Verified: Yes Signs or symptoms of abuse/neglect since last visito No Secondary Verification Process Completed: Yes Hospitalized since last visit: No Patient Requires Transmission-Based Precautions: No Implantable device outside of the clinic excluding No Patient Has Alerts: No cellular tissue based products placed in the center since last visit: Has Dressing in Place as Prescribed: Yes Pain Present Now: Yes Electronic Signature(s) Signed: 04/19/2020 5:16:00 PM By: Rhae Hammock RN Entered By: Rhae Hammock on 04/17/2020 07:50:51 -------------------------------------------------------------------------------- Compression Therapy Details Patient Name: Date of Service: Martin [name]. 04/17/2020 7:30 A M Medical Record Number: [id number] Patient Account Number: [id number] Date of Birth/Sex: Treating RN: 1945-10-22 (74 y.o. Martin [name] Primary Care Jalan Bodi: Martin [name] Other Clinician: Referring Martin [name]: Treating Martin [name]/Extender: Martin [name] in Treatment: 16 Compression Therapy Performed for Wound Assessment: Wound #5 Left Metatarsal head fifth Performed  By: Clinician Martin Hurst, RN Compression Type: Three Layer Post Procedure Diagnosis Same as Pre-procedure Electronic Signature(s) Signed: 04/17/2020 5:38:15 PM By: Martin Hurst RN, BSN Entered By: Martin [name] on 04/17/2020 08:24:37 -------------------------------------------------------------------------------- Encounter Discharge Information Details Patient Name: Date of Service: Martin [name]. 04/17/2020 7:30 A M Medical Record Number: [id number] Patient Account Number: [id number] Date of Birth/Sex: Treating RN: 1945/10/31 (74 y.o. Martin [name], Martin [name] Primary Care Delando Satter: Martin [name] Other Clinician: Referring Martin [name]: Treating Martin [name]/Extender: Martin [name] in Treatment: 16 Encounter Discharge Information Items Discharge Condition: Stable Ambulatory Status: Cane Discharge Destination: Home Transportation: Private Auto Accompanied By: self Schedule Follow-up Appointment: Yes Clinical Summary of Care: Patient Declined Electronic Signature(s) Signed: 04/19/2020 5:16:00 PM By: Rhae Hammock RN Entered By: Rhae Hammock on 04/17/2020 08:30:21 -------------------------------------------------------------------------------- Lower Extremity Assessment Details Patient Name: Date of Service: Martin [name], Martin [name] 04/17/2020 7:30 A M Medical Record Number: [id number] Patient Account Number: [id number] Date of Birth/Sex: Treating RN: May 15, 1945 (74 y.o. Martin [name], Martin [name] Primary Care Jahne Krukowski: Martin [name] Other Clinician: Referring Martin [name]: Treating Martin [name]/Extender: Martin [name] in Treatment: 16 Edema Assessment Assessed: Martin [name]: No] Martin [name]: No] Edema: [Left: Ye] [Right: s] Calf Left: Right: Point of Measurement: 32 cm From Medial Instep 41 cm Ankle Left: Right: Point of Measurement: 11 cm From Medial Instep 30 cm Vascular Assessment Pulses: Dorsalis Pedis Palpable: [Left:Yes] Posterior  Tibial Palpable: [Left:Yes] Electronic Signature(s) Signed: 04/19/2020 5:16:00 PM By: Rhae Hammock RN Entered By: Rhae Hammock on 04/17/2020 08:00:05 -------------------------------------------------------------------------------- Multi Wound Chart Details Patient Name: Date of Service: Martin [name]. 04/17/2020 7:30 A M Medical Record Number: [id number] Patient Account Number: [id number] Date of Birth/Sex: Treating RN: August 15, 1945 (74 y.o. Martin [name] Primary Care Martin [name]: Martin [name] Other Clinician: Referring Martin [name]: Treating Martin [name]/Extender: Martin [name] in Treatment: 16 Vital Signs Height(in): 72 Capillary Blood Glucose(mg/dl): 129 Weight(lbs): 270 Pulse(bpm): 79 Body Mass Index(BMI): 37 Blood Pressure(mmHg): 173/88 Temperature(F): 98.3 Respiratory Rate(breaths/min): 18 Photos: [5:No Photos Left Metatarsal head fifth] [N/A:N/A  N/A] Wound Location: [5:Gradually Appeared] [N/A:N/A] Wounding Event: [5:Diabetic Wound/Ulcer of the Lower] [N/A:N/A] Primary Etiology: [5:Extremity Cataracts, Coronary Artery Disease, N/A] Comorbid History: [5:Hypertension, Type II Diabetes, Neuropathy 10/31/2019] [N/A:N/A] Date Acquired: [5:16] [N/A:N/A] Weeks of Treatment: [5:Open] [N/A:N/A] Wound Status: [5:1.5x1.3x0.3] [N/A:N/A] Measurements L x W x D (cm) [5:1.532] [N/A:N/A] A (cm) : rea [5:0.459] [N/A:N/A] Volume (cm) : [5:-26.60%] [N/A:N/A] % Reduction in A rea: [5:45.80%] [N/A:N/A] % Reduction in Volume: [5:12] Starting Position 1 (o'clock): [5:12] Ending Position 1 (o'clock): [5:1] Maximum Distance 1 (cm): [5:Yes] [N/A:N/A] Undermining: [5:Grade 3] [N/A:N/A] Classification: [5:Medium] [N/A:N/A] Exudate A mount: [5:Serosanguineous] [N/A:N/A] Exudate Type: [5:red, brown] [N/A:N/A] Exudate Color: [5:Well defined, not attached] [N/A:N/A] Wound Margin: [5:Large (67-100%)] [N/A:N/A] Granulation A mount: [5:Pink]  [N/A:N/A] Granulation Quality: [5:Small (1-33%)] [N/A:N/A] Necrotic A mount: [5:Fat Layer (Subcutaneous Tissue): Yes N/A] Exposed Structures: [5:Bone: Yes Fascia: No Tendon: No Muscle: No Joint: No Medium (34-66%)] [N/A:N/A] Treatment Notes Electronic Signature(s) Signed: 04/17/2020 5:38:15 PM By: Martin Hurst RN, BSN Signed: 04/17/2020 7:23:52 PM By: Linton Ham MD Entered By: Linton Ham on 04/17/2020 08:24:32 -------------------------------------------------------------------------------- Multi-Disciplinary Care Plan Details Patient Name: Date of Service: Martin [name]. 04/17/2020 7:30 A M Medical Record Number: [id number] Patient Account Number: [id number] Date of Birth/Sex: Treating RN: 1945-09-05 (74 y.o. Martin [name] Primary Care Maryjo Ragon: Martin [name] Other Clinician: Referring Ilean Spradlin: Treating Johnelle Tafolla/Extender: Martin [name] in Treatment: 16 Active Inactive Nutrition Nursing Diagnoses: Impaired glucose control: actual or potential Potential for alteratiion in Nutrition/Potential for imbalanced nutrition Goals: Patient/caregiver will maintain therapeutic glucose control Date Initiated: 12/22/2019 Target Resolution Date: 05/19/2020 Goal Status: Active Interventions: Assess HgA1c results as ordered upon admission and as needed Treatment Activities: Patient referred to Primary Care Physician for further nutritional evaluation : 12/22/2019 Notes: Soft Tissue Infection Nursing Diagnoses: Impaired tissue integrity Potential for infection: soft tissue Goals: Patient's soft tissue infection will resolve Date Initiated: 03/29/2020 Target Resolution Date: 05/19/2020 Goal Status: Active Interventions: Assess signs and symptoms of infection every visit Provide education on infection Treatment Activities: Culture and sensitivity : 03/29/2020 Education provided on Infection : 03/29/2020 Systemic antibiotics : 03/29/2020 T ordered  outside of clinic : 03/29/2020 est Notes: Wound/Skin Impairment Nursing Diagnoses: Impaired tissue integrity Knowledge deficit related to ulceration/compromised skin integrity Goals: Patient/caregiver will verbalize understanding of skin care regimen Date Initiated: 12/22/2019 Target Resolution Date: 05/10/2020 Goal Status: Active Ulcer/skin breakdown will have a volume reduction of 30% by week 4 Date Initiated: 12/22/2019 Date Inactivated: 01/19/2020 Target Resolution Date: 01/19/2020 Goal Status: Unmet Unmet Reason: infection Ulcer/skin breakdown will have a volume reduction of 50% by week 8 Date Initiated: 01/19/2020 Date Inactivated: 02/16/2020 Target Resolution Date: 02/16/2020 Goal Status: Unmet Unmet Reason: infection Ulcer/skin breakdown will have a volume reduction of 80% by week 12 Date Initiated: 02/16/2020 Date Inactivated: 03/15/2020 Target Resolution Date: 03/15/2020 Goal Status: Unmet Unmet Reason: osteo, getting HBO Interventions: Assess patient/caregiver ability to obtain necessary supplies Assess patient/caregiver ability to perform ulcer/skin care regimen upon admission and as needed Assess ulceration(s) every visit Provide education on ulcer and skin care Treatment Activities: Skin care regimen initiated : 12/22/2019 Topical wound management initiated : 12/22/2019 Notes: Electronic Signature(s) Signed: 04/17/2020 5:38:15 PM By: Martin Hurst RN, BSN Entered By: Martin [name] on 04/17/2020 07:50:33 -------------------------------------------------------------------------------- Pain Assessment Details Patient Name: Date of Service: Martin [name]. 04/17/2020 7:30 A M Medical Record Number: [id number] Patient Account Number: [id number] Date of Birth/Sex: Treating RN: 1946/03/27 (74 y.o. Erie Noe Primary Care Amador Braddy: Martin [name] Other Clinician: Referring Tivis Wherry: Treating  Hannan Tetzlaff/Extender: Martin [name] in  Treatment: 16 Active Problems Location of Pain Severity and Description of Pain Patient Has Paino Yes Site Locations Pain Location: Pain in Ulcers With Dressing Change: Yes Duration of the Pain. Constant / Intermittento Intermittent Rate the pain. Current Pain Level: 3 Worst Pain Level: 10 Least Pain Level: 0 Tolerable Pain Level: 7 Character of Pain Describe the Pain: Aching Pain Management and Medication Current Pain Management: Medication: Yes Cold Application: No Rest: Yes Massage: No Activity: No T.E.N.S.: No Heat Application: No Leg drop or elevation: No Is the Current Pain Management Adequate: Adequate How does your wound impact your activities of daily livingo Sleep: No Bathing: No Appetite: No Relationship With Others: No Bladder Continence: No Emotions: No Bowel Continence: No Work: No Toileting: No Drive: No Dressing: No Hobbies: No Electronic Signature(s) Signed: 04/19/2020 5:16:00 PM By: Rhae Hammock RN Entered By: Rhae Hammock on 04/17/2020 07:53:45 -------------------------------------------------------------------------------- Patient/Caregiver Education Details Patient Name: Date of Service: Martin [name] 12/20/2021andnbsp7:30 A M Medical Record Number: [id number] Patient Account Number: [id number] Date of Birth/Gender: Treating RN: 10-Oct-1945 (74 y.o. Martin [name] Primary Care Physician: Martin [name] Other Clinician: Referring Physician: Treating Physician/Extender: Martin [name] in Treatment: 16 Education Assessment Education Provided To: Patient Education Topics Provided Wound/Skin Impairment: Methods: Explain/Verbal Responses: State content correctly [employer]) Signed: 04/17/2020 5:38:15 PM By: Martin Hurst RN, BSN Entered By: Martin [name] on 04/17/2020 07:54:14 -------------------------------------------------------------------------------- Wound Assessment  Details Patient Name: Date of Service: Martin [name]. 04/17/2020 7:30 A M Medical Record Number: [id number] Patient Account Number: [id number] Date of Birth/Sex: Treating RN: 1945-08-09 (74 y.o. Martin [name], Martin [name] Primary Care Wright Gravely: Martin [name] Other Clinician: Referring Zyad Boomer: Treating Kidada Ging/Extender: Martin [name] in Treatment: 16 Wound Status Wound Number: 5 Primary Diabetic Wound/Ulcer of the Lower Extremity Etiology: Wound Location: Left Metatarsal head fifth Wound Open Wounding Event: Gradually Appeared Status: Date Acquired: 10/31/2019 Comorbid Cataracts, Coronary Artery Disease, Hypertension, Type II Weeks Of Treatment: 16 History: Diabetes, Neuropathy Clustered Wound: No Photos Photo Uploaded By: [name] on 04/18/2020 14:33:15 Wound Measurements Length: (cm) 1.5 Width: (cm) 1.3 Depth: (cm) 0.3 Area: (cm) 1.532 Volume: (cm) 0.459 % Reduction in Area: -26.6% % Reduction in Volume: 45.8% Epithelialization: Medium (34-66%) Tunneling: No Undermining: Yes Starting Position (o'clock): 12 Ending Position (o'clock): 12 Maximum Distance: (cm) 1 Wound Description Classification: Grade 3 Wound Margin: Well defined, not attached Exudate Amount: Medium Exudate Type: Serosanguineous Exudate Color: red, brown Foul Odor After Cleansing: No Slough/Fibrino Yes Wound Bed Granulation Amount: Large (67-100%) Exposed Structure Granulation Quality: Pink Fascia Exposed: No Necrotic Amount: Small (1-33%) Fat Layer (Subcutaneous Tissue) Exposed: Yes Necrotic Quality: Adherent Slough Tendon Exposed: No Muscle Exposed: No Joint Exposed: No Bone Exposed: Yes Electronic Signature(s) Signed: 04/19/2020 5:16:00 PM By: Rhae Hammock RN Entered By: Rhae Hammock on 04/17/2020 08:01:52 -------------------------------------------------------------------------------- Vitals Details Patient Name: Date of Service: Martin [name]. 04/17/2020 7:30 A M Medical Record Number: [id number] Patient Account Number: [id number] Date of Birth/Sex: Treating RN: 09/21/1945 (74 y.o. Martin [name], Martin [name] Primary Care Elyna Pangilinan: Martin [name] Other Clinician: Referring Jowanda Heeg: Treating Kelsie Kramp/Extender: Martin [name] in Treatment: 16 Vital Signs Time Taken: 07:50 Temperature (F): 98.3 Height (in): 72 Pulse (bpm): 79 Weight (lbs): 270 Respiratory Rate (breaths/min): 18 Body Mass Index (BMI): 36.6 Blood Pressure (mmHg): 173/88 Capillary Blood Glucose (mg/dl): 129 Reference Range: 80 - 120 mg / dl Electronic Signature(s) Signed: 04/19/2020 5:16:00 PM By: Rhae Hammock RN Entered By: Rhae Hammock  on 04/17/2020 07:53:09

## 2020-04-20 ENCOUNTER — Encounter (HOSPITAL_BASED_OUTPATIENT_CLINIC_OR_DEPARTMENT_OTHER): Payer: PPO | Admitting: Internal Medicine

## 2020-04-20 ENCOUNTER — Other Ambulatory Visit: Payer: Self-pay

## 2020-04-20 DIAGNOSIS — E11621 Type 2 diabetes mellitus with foot ulcer: Secondary | ICD-10-CM | POA: Diagnosis not present

## 2020-04-20 DIAGNOSIS — L97524 Non-pressure chronic ulcer of other part of left foot with necrosis of bone: Secondary | ICD-10-CM | POA: Diagnosis not present

## 2020-04-20 LAB — GLUCOSE, CAPILLARY
Glucose-Capillary: 104 mg/dL — ABNORMAL HIGH (ref 70–99)
Glucose-Capillary: 120 mg/dL — ABNORMAL HIGH (ref 70–99)
Glucose-Capillary: 154 mg/dL — ABNORMAL HIGH (ref 70–99)

## 2020-04-20 NOTE — Progress Notes (Signed)
SHAMUS, DESANTIS ([id number]) Visit Report for 04/20/2020 Arrival Information Details Patient Name: Date of Service: Martin [name], Martin [name] 04/20/2020 8:00 A M Medical Record Number: [id number] Patient Account Number: [id number] Date of Birth/Sex: Treating RN: 06-30-1945 (74 y.o. Martin [name], Martin [name] Primary Care Martin [name]: Martin [name] Other Clinician: Mikeal [name] Referring Martin [name]: Treating Martin [name]/Extender: Martin [name] in Treatment: 40 Visit Information History Since Last Visit Added or deleted any medications: No Patient Arrived: Martin [name] Any new allergies or adverse reactions: No Arrival Time: 08:00 Had a fall or experienced change in No Accompanied By: self activities of daily living that may affect Transfer Assistance: None risk of falls: Patient Identification Verified: Yes Signs or symptoms of abuse/neglect since last visito No Secondary Verification Process Completed: Yes Hospitalized since last visit: No Patient Requires Transmission-Based Precautions: No Implantable device outside of the clinic excluding No Patient Has Alerts: No cellular tissue based products placed in the center since last visit: Pain Present Now: No Electronic Signature(s) Signed: 04/20/2020 3:16:40 PM By: Martin [name] EMT/HBOT/SD Entered By: Martin [name] on 04/20/2020 08:22:31 -------------------------------------------------------------------------------- Encounter Discharge Information Details Patient Name: Date of Service: Martin Agee. 04/20/2020 8:00 A M Medical Record Number: [id number] Patient Account Number: [id number] Date of Birth/Sex: Treating RN: [birth year] (74 y.o. Martin [name] Primary Care Martin [name]: Martin [name] Other Clinician: Mikeal [name] Referring Martin [name]: Treating Martin [name]/Extender: Martin [name] in Treatment: 17 Encounter Discharge Information Items Discharge Condition: Stable Ambulatory Status:  Cane Discharge Destination: Home Transportation: Private Auto Accompanied By: self Schedule Follow-up Appointment: Yes Clinical Summary of Care: Patient Declined Electronic Signature(s) Signed: 04/20/2020 3:16:40 PM By: Martin [name] EMT/HBOT/SD Entered By: Martin [name] on 04/20/2020 11:33:24 -------------------------------------------------------------------------------- Patient/Caregiver Education Details Patient Name: Date of Service: Martin [name], [name] 12/23/2021andnbsp8:00 A M Medical Record Number: [id number] Patient Account Number: [id number] Date of Birth/Gender: Treating RN: [birth year] (74 y.o. Martin [name] Primary Care Physician: Martin [name] Other Clinician: Mikeal [name] Referring Physician: Treating Physician/Extender: Martin [name] in Treatment: 17 Education Assessment Education Provided To: Patient Education Topics Provided Hyperbaric Oxygenation: Methods: Explain/Verbal Responses: State content correctly Electronic Signature(s) Signed: 04/20/2020 3:16:40 PM By: Martin [name] EMT/HBOT/SD Entered By: Martin [name] on 04/20/2020 11:33:11 -------------------------------------------------------------------------------- Vitals Details Patient Name: Date of Service: Martin Agee. 04/20/2020 8:00 A M Medical Record Number: [id number] Patient Account Number: [id number] Date of Birth/Sex: Treating RN: [birth year] (74 y.o. Martin [name], Martin [name] Primary Care Martin [name]: Martin [name] Other Clinician: Mikeal [name] Referring Martin [name]: Treating Martin [name]/Extender: Martin [name] in Treatment: 17 Vital Signs Time Taken: 08:05 Temperature (F): 98.3 Height (in): 72 Pulse (bpm): 68 Weight (lbs): 270 Respiratory Rate (breaths/min): 18 Body Mass Index (BMI): 36.6 Blood Pressure (mmHg): 156/71 Capillary Blood Glucose (mg/dl): 120 Reference Range: 80 - 120 mg / dl Notes Pt initial CBG 104. Pt given glucerna  and upon reassessment, CBG was 120. Pt typical presentation after tx is CBG tends to rise. Martin [name] notified and gave the all clear to dive pt Electronic Signature(s) Signed: 04/20/2020 3:16:40 PM By: Martin [name] EMT/HBOT/SD Previous Signature: 04/20/2020 8:42:47 AM Version By: Martin [name] EMT/HBOT/SD Entered By: Martin [name] on 04/20/2020 08:42:51

## 2020-04-20 NOTE — Progress Notes (Signed)
[name], [name] ([id number]) Visit Report for 04/20/2020 SuperBill Details Patient Name: Date of Service: Martin [name], Martin [name] 04/20/2020 Medical Record Number: [id number] Patient Account Number: [id number] Date of Birth/Sex: Treating RN: 29-Aug-1945 (74 y.o. 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

## 2020-04-20 NOTE — Progress Notes (Addendum)
Martin [name] ([id number]) Visit Report for 04/20/2020 HBO Details Patient Name: Date of Service: Martin [name], Martin [name] 04/20/2020 8:00 A M Medical Record Number: [id number] Patient Account Number: [id number] Date of Birth/Sex: Treating RN: 1946-02-13 (74 y.o. Martin [name], Martin [name] Primary Care Laketa Sandoz: Tana Conch Other Clinician: Benjaman Kindler Referring [place] Zaragoza: Treating [name]: [name] in Treatment: 17 HBO Treatment Course Details Treatment Course Number: 1 Ordering Narmeen Kerper: Lenda Kelp T Treatments Ordered: otal 40 HBO Treatment Start Date: 02/22/2020 HBO Indication: Diabetic Ulcer(s) of the Lower Extremity HBO Treatment Details Treatment Number: 34 Patient Type: Outpatient Chamber Type: Monoplace Chamber Serial #: [id number] Treatment Protocol: 2.0 ATA with 90 minutes oxygen, and no air breaks Treatment Details Compression Rate Down: 2.0 psi / minute De-Compression Rate Up: 2.0 psi / minute Air breaks and breathing Decompress Decompress Compress Tx Pressure Begins Reached periods Begins Ends (leave unused spaces blank) Chamber Pressure (ATA 1 2 ------2 1 ) Clock Time (24 hr) 08:38 08:46 - - - - - - 10:16 10:24 Treatment Length: 106 (minutes) Treatment Segments: 4 Vital Signs Capillary Blood Glucose Reference Range: 80 - 120 mg / dl HBO Diabetic Blood Glucose Intervention Range: <131 mg/dl or >509 mg/dl Time Vitals Blood Respiratory Capillary Blood Glucose Pulse Action Type: Pulse: Temperature: Taken: Pressure: Rate: Glucose (mg/dl): Meter #: Oximetry (%) Taken: Pre 08:05 156/71 68 18 98.3 120 Post 10:26 149/73 72 15 98 154 Treatment Response Treatment Toleration: Well Treatment Completion Status: Treatment Completed without Adverse Event Additional Procedure Documentation Tissue Sevierity: Necrosis of bone [name] Notes No concerns with treatment given Physician HBO Attestation: I certify that I supervised  this HBO treatment in accordance with Medicare guidelines. A trained emergency response team is readily available per Yes hospital policies and procedures. Continue HBOT as ordered. Yes Electronic Signature(s) Signed: 04/20/2020 3:50:51 PM By: Baltazar Najjar MD Previous Signature: 04/20/2020 3:16:40 PM Version By: Benjaman Kindler EMT/HBOT/SD Previous Signature: 04/20/2020 3:16:40 PM Version By: Benjaman Kindler EMT/HBOT/SD Entered By: Baltazar Najjar on 04/20/2020 15:30:59 -------------------------------------------------------------------------------- HBO Safety Checklist Details Patient Name: Date of Service: Martin [name]. 04/20/2020 8:00 A M Medical Record Number: [id number] Patient Account Number: [id number] Date of Birth/Sex: Treating RN: [birth year] (74 y.o. Martin [name], Martin [name] Primary Care Averey Trompeter: Tana Conch Other Clinician: Benjaman Kindler Referring Annalia Metzger: Treating Jehu Mccauslin/Extender: [name] in Treatment: 17 HBO Safety Checklist Items Safety Checklist Consent Form Signed Patient voided / foley secured and emptied When did you last eato 0800 - Glucerna Last dose of injectable or oral agent insulin Ostomy pouch emptied and vented if applicable NA All implantable devices assessed, documented and approved NA Intravenous access site secured and place NA Valuables secured Linens and cotton and cotton/polyester blend (less than 51% polyester) Personal oil-based products / skin lotions / body lotions removed Wigs or hairpieces removed NA Smoking or tobacco materials removed NA Books / newspapers / magazines / loose paper removed Cologne, aftershave, perfume and deodorant removed Jewelry removed (may wrap wedding band) Make-up removed NA Hair care products removed Battery operated devices (external) removed NA Heating patches and chemical warmers removed NA Titanium eyewear removed NA Nail polish cured greater than 10  hours NA Casting material cured greater than 10 hours NA Hearing aids removed NA Loose dentures or partials removed NA Prosthetics have been removed NA Patient demonstrates correct use of air break device (if applicable) Patient concerns have been addressed Patient grounding bracelet on and cord attached to chamber Specifics for Inpatients (complete in addition to above) Medication  sheet sent with patient Intravenous medications needed or due during therapy sent with patient Drainage tubes (e.g. nasogastric tube or chest tube secured and vented) Endotracheal or Tracheotomy tube secured Cuff deflated of air and inflated with saline Airway suctioned Electronic Signature(s) Signed: 04/20/2020 8:43:31 AM By: [name] EMT/HBOT/SD Entered By: [name] on 04/20/2020 08:43:31

## 2020-04-24 ENCOUNTER — Other Ambulatory Visit (HOSPITAL_COMMUNITY): Payer: Self-pay | Admitting: Internal Medicine

## 2020-04-24 ENCOUNTER — Ambulatory Visit (HOSPITAL_COMMUNITY)
Admission: RE | Admit: 2020-04-24 | Discharge: 2020-04-24 | Disposition: A | Discharge status: Home / Self Care | Payer: PPO | Source: Ambulatory Visit | Attending: Internal Medicine | Admitting: Internal Medicine

## 2020-04-24 ENCOUNTER — Encounter (HOSPITAL_BASED_OUTPATIENT_CLINIC_OR_DEPARTMENT_OTHER): Payer: PPO | Admitting: Internal Medicine

## 2020-04-24 ENCOUNTER — Other Ambulatory Visit: Payer: Self-pay

## 2020-04-24 DIAGNOSIS — M869 Osteomyelitis, unspecified: Secondary | ICD-10-CM | POA: Insufficient documentation

## 2020-04-24 DIAGNOSIS — E11621 Type 2 diabetes mellitus with foot ulcer: Secondary | ICD-10-CM

## 2020-04-24 DIAGNOSIS — E1169 Type 2 diabetes mellitus with other specified complication: Secondary | ICD-10-CM | POA: Diagnosis not present

## 2020-04-24 DIAGNOSIS — L97509 Non-pressure chronic ulcer of other part of unspecified foot with unspecified severity: Secondary | ICD-10-CM | POA: Insufficient documentation

## 2020-04-24 DIAGNOSIS — L97529 Non-pressure chronic ulcer of other part of left foot with unspecified severity: Secondary | ICD-10-CM | POA: Diagnosis not present

## 2020-04-24 MED ORDER — GADOBUTROL 1 MMOL/ML IV SOLN: 10.0000 mL | Freq: Once | INTRAVENOUS | Status: DC | PRN

## 2020-04-24 NOTE — Progress Notes (Signed)
Failed attempt at MRI. Patient terminated the procedure due to left foot pain and refused to continue. MRI without contrast completed.

## 2020-04-25 ENCOUNTER — Encounter (HOSPITAL_BASED_OUTPATIENT_CLINIC_OR_DEPARTMENT_OTHER): Payer: PPO | Admitting: Internal Medicine

## 2020-04-25 DIAGNOSIS — E11621 Type 2 diabetes mellitus with foot ulcer: Secondary | ICD-10-CM | POA: Diagnosis not present

## 2020-04-25 DIAGNOSIS — L97522 Non-pressure chronic ulcer of other part of left foot with fat layer exposed: Secondary | ICD-10-CM | POA: Diagnosis not present

## 2020-04-26 NOTE — Progress Notes (Signed)
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 shows osteomyelitis. I believe a swab culture showed MRSA he received a course of doxycycline but now has been on linezolid for a week. He says linezolid is causing some mild  nausea. But otherwise he is tolerating this well. He will need a another prescription for this which I will take care of today. We have been using silver alginate on the wound under a total contact cast. The wound is improving both in terms of appearance and surface area. He has some concerns about HBO which she is already approved for predominantly anxiety. He states he has had anxiety since open heart surgery a year ago 02/16/2020 on evaluation today patient appears to be doing better in regard to his foot ulcer in my opinion. Things are actually looking good in this regard. With that being said he does have some side effects from the linezolid. He tells me that he has been somewhat nauseated but mainly when he does not eat with the medication. Evenings are okay the mornings when he tends to eat less sometimes seem to be worse. With that being said this seems to be something that he can mitigate. With that being said he also has a rash however in the groin area that he tells me about today. He thinks that this may be due to the medication. Subsequently I think that it is due to the medication in a roundabout way and that the patient has what appears to be a yeast infection/tinea cruris which is likely indirectly secondary related to the fact that the patient has been on strong antibiotics for some time now due to the  osteomyelitis. However I do not think it is a direct side effect of the medication itself per se. 02/23/2020 upon evaluation the patient appears to be doing decently well in regard to his foot ulcer in fact I am very pleased with how things appear today. He does have less undermining and overall I think between the cast and now the start of the hyperbaric oxygen therapy he has a very good chance of getting this wound to heal. Fortunately there is no signs of active infection at this time which is great news. No fevers, chills, nausea, vomiting, or diarrhea. He does note that he has been  having some issues with the linezolid causing him to be nauseated and he also states that it is caused him to lose his smell and taste. With that being said I am really not certain that this is indeed the reason behind this but either way I think we can switch the antibiotic being that he is looking so well with minimal linezolid for close to 3-4 weeks at this point. We will do 2 weeks of Bactrim and then hopefully he should be complete with the antibiotic courses. 03/01/2020 on evaluation today patient is making good progress in regard to his wound. This is measuring smaller today and overall very pleased. The antibiotics which has seemed to help him he states that he is having a lot of improvement overall in his smell and taste as well as improvement in the overall nausea that he was experiencing with the linezolid. Obviously this is great news. 03/08/2020 upon evaluation today patient appears to be doing well with regard to his foot ulcer. I feel like this is showing signs of improvement it is measuring a little bit better today compared to previous. With that being said there is no evidence of active infection which is great news and in general I feel like the hyperbarics is helping him along with the antibiotics which she will be completing I believe in the next week. Outside of this also feel like that the total contact cast obviously has been of benefit. 03/15/2020 on evaluation today patient appears to be doing well in regard to his foot ulcer. He has been tolerating the dressing changes without complication. Fortunately there is no signs of active infection at this time. No fevers, chills, nausea, vomiting, or diarrhea. Patient is tolerating hyperbarics quite well at this time and I see no signs of infection currently which is great news. 03/22/2020 on evaluation today patient appears to be doing about the same in regard to his foot ulcer. He does have some tissue along the medial portion  of the wound bed and I am unsure of exactly what this is just characteristically. It could be a small amount of tendon here to be honest. With that being said I do think it needs to be removed as I feel like it is hindering his healing possibilities. 11/29; acute visit; I was asked to see the patient urgently today because of complaints of pain in his foot. He is a wound in the right fifth metatarsal head plantar aspect with underlying osteomyelitis. He has been undergoing hyperbaric oxygen treatment completing antibiotics in the middle of this month. He said the pain had increased to the point that it was becoming difficult for him to walk on his foot. He has not been systemically unwell.. He had a fairly significant debridement on his last visit 5 days ago. 03/29/2020 on evaluation today patient appears to be doing much better  than he was on Monday according to the pictures and what I saw. Fortunately there is no signs of active infection at this time which is great news. There is no evidence of systemic infection either. Overall I feel like the patient is doing indeed much better and is just a matter of having him continue the antibiotic for the time being in my opinion and then depending on the results of the culture we may extend that for a bit longer. 12/10; I am seeing the patient today because of difficulty had this week with his wife's illness. He could not come on Wednesday. The wound looks a lot better than what I usually am used to seeing. He is using silver alginate and a total contact cast continuing with HBO. He has completed his antibiotics cultures and x-rays were negative 04/12/2020 upon evaluation today patient unfortunately appears to be doing a little worse in regard to his wound from the standpoint of erythema surrounding. There does not appear to be any signs of active systemic infection which is great news. Nonetheless I am concerned about the fact that if we put him back  in the cast he will not be able to visually see what was going on in regard to his foot and if anything is worsening he would have no idea into it could be potentially too late. 12/17; surrounding his dive yesterday afternoon it was brought to my attention about the extent of swelling in the left leg. I really did not have enough time to fully evaluate this before the end of his treatment so I arranged to have a duplex ultrasound done rule out DVT which thankfully turned out to be negative. I note that he was felt to have cellulitis of the left foot surrounding his wound when he was seen on Wednesday by Jeri Cos and was put on trimethoprim sulfamethoxazole. His report showed no evidence of a DVT or SVT . I am seeing him today in follow-up of this. As it turns out his calf measurements have not really changed that much but he has much more swelling in the ankle. It could be because we did not put a total contact cast back on him. I think he has some degree of chronic venous insufficiency. 12/20; I wanted to look at the patient's leg when he was up on the table before he went into hyperbarics however he was put on the schedule today. He is still taking trimethoprim sulfamethoxazole. Culture that was done last Wednesday is negative. We put him in kerlix Coban wrap over the weekend still a lot of swelling in this leg. DVT rule out was negative there is no swelling in his thigh not sure I really have a good explanation for this. He is not complaining of systemic symptoms 04/19/2020 upon evaluation today patient appears to be doing somewhat poorly in regard to his foot ulcer. Again he has seen Dr. Dellia Nims Dr. Dellia Nims did order repeat MRI after I agree with this and think that is the right way to go at this point. Depending on the results of the MRI I discussed with the patient today that we may be looking at a referral potentially for surgery consultation if this occurs the patient would like to see someone  for second opinion other than Dr. Sharol Given I understand and I think we can definitely do that I have never discourage patients from second opinions. With that being said currently I do not see any evidence of worsening infection though  there still appears to be significant erythema. The patient's culture did show that he is on the appropriate antibiotic with the Bactrim DS which I did prescribe for him last week he has another week of this remaining. I think he can continue to take that for the time being. 12/28; patient comes back in the clinic today with a lot of drainage on the outside of his left dorsal foot. Very significant erythema on the lateral part of the foot and dorsal part of the foot which I have marked with a marker. Also have his MRI to review that showed findings suspicious for progressive osteomyelitis involving the fifth metatarsal head and proximal phalanx no drainable fluid collection. This is obviously worse since his last study in September. He has not been systemically unwell. Says he is not running a fever his blood sugars are stable. 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 For this reason he is completely opposed to any surgery presently. Furthermore his arterial status might not allow foot conserving surgery. I have reviewed his arterial studies from 9/1. This showed an ABI on the left of 0.90 however his TBI was only 0.44 but a toe pressure of 68. Monophasic waveforms. I wonder if he has tibial artery calcification and his ABIs are actually falsely elevated. Objective Constitutional Patient is hypertensive.. Pulse regular and within target range for patient.Marland Kitchen Respirations regular, non-labored and within target range.. Temperature is normal and within the target range for the patient.Marland Kitchen Appears in no distress. Vitals Time Taken: 1:52 PM, Height: 72 in, Weight: 270 lbs, BMI: 36.6, Temperature: 98.3 F, Pulse: 81 bpm, Respiratory Rate: 18 breaths/min,  Blood Pressure: 176/70 mmHg. Cardiovascular Faint dorsalis pedis pulse on the left. General Notes: Wound exam; the areas on the plantar fifth metatarsal head. This does not look too much worse than what I am used to seeing there is very little tissue over the bone. However I am concerned about the erythema on the lateral part of the foot and the erythema dorsally looking like cellulitis. We have much better edema control in the left leg with the compression wraps but I am concerned about wrapping this today. Integumentary (Hair, Skin) Wound #5 status is Open. Original cause of wound was Gradually Appeared. The wound is located on the Left Metatarsal head fifth. The wound measures 1.4cm length x 1cm width x 0.2cm depth; 1.1cm^2 area and 0.22cm^3 volume. There is Fat Layer (Subcutaneous Tissue) exposed. There is no tunneling or undermining noted. There is a large amount of purulent drainage noted. The wound margin is well defined and not attached to the wound base. There is large (67- 100%) pink granulation within the wound bed. There is a small (1-33%) amount of necrotic tissue within the wound bed including Adherent Slough. General Notes: blistering and maceration with redness noted to left dorsal foot to wound area. 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 Bathing/ Shower/ Hygiene: May shower with protection but do not get wound dressing(s) wet. Off-Loading: Wedge shoe to: - front offloading shoe with felt to offload lateral foot Additional Orders / Instructions: Other: - Go to emergency room for fever, chills, increased redness, increased pain in left foot Hyperbaric  Oxygen Therapy: Indication: -  wagner grade 3 diabetic foot ulcer left foot If appropriate for treatment, begin HBOT per protocol: 2.0 ATA for 90 Minutes without Air Breaks T Number of Treatments: - 40 otal One treatments per day (delivered Monday through Friday unless otherwise specified in Special Instructions below): Finger stick Blood Glucose Pre- and Post- HBOT Treatment. Follow Hyperbaric Oxygen Glycemia Protocol Afrin (Oxymetazoline HCL) 0.05% nasal spray - 1 spray in both nostrils daily as needed prior to HBO treatment for difficulty clearing ears Consults ordered were: Infectious Disease - *UURGENT* worsening osteomyelitis of left 5th met head, left foot diabetic foot ulcer The following medication(s) was prescribed: linezolid oral 600 mg tablet 1 tablet oral bid for 7 days for wound infection mrsa starting 04/25/2020 WOUND #5: - Metatarsal head fifth Wound Laterality: Left Peri-Wound Care: Zinc Oxide Ointment 30g tube 1 x Per Day/30 Days Discharge Instructions: Apply Zinc Oxide to periwound with each dressing change Peri-Wound Care: Sween Lotion (Moisturizing lotion) 1 x Per Day/30 Days Discharge Instructions: Apply moisturizing lotion as directed Prim Dressing: KerraCel Ag Gelling Fiber Dressing, 2x2 in (silver alginate) 1 x Per Day/30 Days ary Discharge Instructions: Lightly pack undermining Secondary Dressing: Woven Gauze Sponge, Non-Sterile 4x4 in 1 x Per Day/30 Days Discharge Instructions: Apply over primary dressing as directed. Secondary Dressing: Optifoam Non-Adhesive Dressing, 4x4 in 1 x Per Day/30 Days Discharge Instructions: Foam donut Secured With: Elastic Bandage 4 inch (ACE bandage) 1 x Per Day/30 Days Discharge Instructions: Secure with ACE bandage as directed. 1. I have given him linezolid 600 mg twice daily. Last culture from 2 weeks ago was MRSA resistant to doxycycline. 2. I think this man has progressive osteomyelitis in the left fifth met head and the  proximal phalanx as suggested. I am going to refer him to ID. I did give them the option of seeing surgery for consideration of a ray amputation however he refused this because he cannot afford to be off his foot while he is caring for his wife. However looking at his arterial studies would make me worried that he would not heal this in any case. 3. 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 As long as we are on this course at some point I am going to have to send him to see vein and vascular 4. I have chosen the linezolid because of an MRSA culture from 2 weeks ago. I am assuming that the underlying bone here probably has MRSA as well. It would be possible to get a bone culture We will see him back next week I have marked the area of erythema and told him to watch this. Electronic Signature(s) Signed: 04/26/2020 12:07:59 PM By: Linton Ham MD Signed: 04/26/2020 12:07:59 PM By: Linton Ham MD Entered By: Linton Ham on 04/25/2020 15:01:35 -------------------------------------------------------------------------------- SuperBill Details Patient Name: Date of Service: Martin [name], Martin [name] 04/25/2020 Medical Record Number: [id number] Patient Account Number: [id number] Date of Birth/Sex: Treating RN: June 26, 1945 (74 y.o. [name] Primary Care Provider: [name] Other Clinician: Referring Provider: Treating Provider/Extender: [name] in Treatment: 17 Diagnosis Coding ICD-10 Codes Code Description E11.621 Type 2 diabetes mellitus with foot ulcer L97.522 Non-pressure chronic ulcer of other part of left foot with fat layer exposed I25.10 Atherosclerotic heart disease of native coronary artery without angina pectoris M86.272 Subacute osteomyelitis, left ankle and foot L03.116 Cellulitis of left lower limb Facility Procedures CPT4 Code: [id number] Description: 99213 - WOUND CARE VISIT-LEV 3  EST PT Modifier: Quantity:  1 Physician Procedures : CPT4 Code Description Modifier [id number] - WC PHYS LEVEL 4 - EST PT ICD-10 Diagnosis Description M86.272 Subacute osteomyelitis, left ankle and foot L03.116 Cellulitis of left lower limb E11.621 Type 2 diabetes mellitus with foot ulcer L97.522  Non-pressure chronic ulcer of other part of left foot with fat layer exposed Quantity: 1 Electronic Signature(s) Signed: 04/26/2020 12:07:59 PM By: Linton Ham MD Entered By: Linton Ham on 04/25/2020 15:02:32

## 2020-04-26 NOTE — Progress Notes (Signed)
HOYTE, VOISINE ([id number]) Visit Report for 04/25/2020 Arrival Information Details Patient Name: Date of Service: Martin [name], Martin [name] 04/25/2020 1:45 PM Medical Record Number: [id number] Patient Account Number: [id number] Date of Birth/Sex: Treating RN: 1945-10-14 (74 y.o. [name], [name] Primary Care Frederika Hukill: [name] Other Clinician: Referring Ani Deoliveira: Treating Grizelda Piscopo/Extender: [name] in Treatment: 48 Visit Information History Since Last Visit Added or deleted any medications: No Patient Arrived: [name] Any new allergies or adverse reactions: No Arrival Time: 13:50 Had a fall or experienced change in No Accompanied By: self activities of daily living that may affect Transfer Assistance: None risk of falls: Patient Identification Verified: Yes Signs or symptoms of abuse/neglect since last visito No Secondary Verification Process Completed: Yes Hospitalized since last visit: No Patient Requires Transmission-Based Precautions: No Implantable device outside of the clinic excluding No Patient Has Alerts: No cellular tissue based products placed in the center since last visit: Has Dressing in Place as Prescribed: Yes Pain Present Now: No Electronic Signature(s) Signed: 04/26/2020 10:14:31 AM By: Sandre Kitty Entered By: Sandre Kitty on 04/25/2020 13:52:05 -------------------------------------------------------------------------------- Clinic Level of Care Assessment Details Patient Name: Date of Service: JESAIAH, TOMME 04/25/2020 1:45 PM Medical Record Number: [id number] Patient Account Number: [id number] Date of Birth/Sex: Treating RN: [birth year] (74 y.o. [name] Primary Care Jenah Vanasten: [name] Other Clinician: Referring Saryah Loper: Treating Maijor Hornig/Extender: [name] in Treatment: 17 Clinic Level of Care Assessment Items TOOL 4 Quantity Score []  - 0 Use when only an EandM is  performed on FOLLOW-UP visit ASSESSMENTS - Nursing Assessment / Reassessment X- 1 10 Reassessment of Co-morbidities (includes updates in patient status) X- 1 5 Reassessment of Adherence to Treatment Plan ASSESSMENTS - Wound and Skin A ssessment / Reassessment X - Simple Wound Assessment / Reassessment - one wound 1 5 []  - 0 Complex Wound Assessment / Reassessment - multiple wounds []  - 0 Dermatologic / Skin Assessment (not related to wound area) ASSESSMENTS - Focused Assessment X- 1 5 Circumferential Edema Measurements - multi extremities []  - 0 Nutritional Assessment / Counseling / Intervention X- 1 5 Lower Extremity Assessment (monofilament, tuning fork, pulses) []  - 0 Peripheral Arterial Disease Assessment (using hand held doppler) ASSESSMENTS - Ostomy and/or Continence Assessment and Care []  - 0 Incontinence Assessment and Management []  - 0 Ostomy Care Assessment and Management (repouching, etc.) PROCESS - Coordination of Care X - Simple Patient / Family Education for ongoing care 1 15 []  - 0 Complex (extensive) Patient / Family Education for ongoing care X- 1 10 Staff obtains Programmer, systems, Records, T Results / Process Orders est []  - 0 Staff telephones HHA, Nursing Homes / Clarify orders / etc []  - 0 Routine Transfer to another Facility (non-emergent condition) []  - 0 Routine Hospital Admission (non-emergent condition) []  - 0 New Admissions / Biomedical engineer / Ordering NPWT Apligraf, etc. , []  - 0 Emergency Hospital Admission (emergent condition) X- 1 10 Simple Discharge Coordination []  - 0 Complex (extensive) Discharge Coordination PROCESS - Special Needs []  - 0 Pediatric / Minor Patient Management []  - 0 Isolation Patient Management []  - 0 Hearing / Language / Visual special needs []  - 0 Assessment of Community assistance (transportation, D/C planning, etc.) []  - 0 Additional assistance / Altered mentation []  - 0 Support Surface(s) Assessment  (bed, cushion, seat, etc.) INTERVENTIONS - Wound Cleansing / Measurement X - Simple Wound Cleansing - one wound 1 5 []  - 0 Complex Wound Cleansing - multiple wounds X- 1 5 Wound  Imaging (photographs - any number of wounds) []  - 0 Wound Tracing (instead of photographs) X- 1 5 Simple Wound Measurement - one wound []  - 0 Complex Wound Measurement - multiple wounds INTERVENTIONS - Wound Dressings X - Small Wound Dressing one or multiple wounds 1 10 []  - 0 Medium Wound Dressing one or multiple wounds []  - 0 Large Wound Dressing one or multiple wounds X- 1 5 Application of Medications - topical []  - 0 Application of Medications - injection INTERVENTIONS - Miscellaneous []  - 0 External ear exam []  - 0 Specimen Collection (cultures, biopsies, blood, body fluids, etc.) []  - 0 Specimen(s) / Culture(s) sent or taken to Lab for analysis []  - 0 Patient Transfer (multiple staff / Civil Service fast streamer / Similar devices) []  - 0 Simple Staple / Suture removal (25 or less) []  - 0 Complex Staple / Suture removal (26 or more) []  - 0 Hypo / Hyperglycemic Management (close monitor of Blood Glucose) []  - 0 Ankle / Brachial Index (ABI) - do not check if billed separately X- 1 5 Vital Signs Has the patient been seen at the hospital within the last three years: Yes Total Score: 100 Level Of Care: New/Established - Level 3 Electronic Signature(s) Signed: 04/25/2020 6:35:32 PM By: Baruch Gouty RN, BSN Entered By: Baruch Gouty on 04/25/2020 14:35:57 -------------------------------------------------------------------------------- Encounter Discharge Information Details Patient Name: Date of Service: [name] 04/25/2020 1:45 PM Medical Record Number: [id number] Patient Account Number: [id number] Date of Birth/Sex: Treating RN: [birth year] (74 y.o. [name] Primary Care Nishita Isaacks: [name] Other Clinician: Referring Daltyn Degroat: Treating Preslea Rhodus/Extender: [name] in Treatment: 17 Encounter Discharge Information Items Discharge Condition: Stable Ambulatory Status: Cane Discharge Destination: Home Transportation: Private Auto Accompanied By: self Schedule Follow-up Appointment: Yes Clinical Summary of Care: Electronic Signature(s) Signed: 04/25/2020 6:26:47 PM By: Deon Pilling Entered By: Deon Pilling on 04/25/2020 18:15:27 -------------------------------------------------------------------------------- Lower Extremity Assessment Details Patient Name: Date of Service: JAPHET, GUIDEN 04/25/2020 1:45 PM Medical Record Number: [id number] Patient Account Number: [id number] Date of Birth/Sex: Treating RN: April 29, 1946 (74 y.o. [name] Primary Care Arden Axon: [name] Other Clinician: Referring Tywana Robotham: Treating Tivis Wherry/Extender: [name] in Treatment: 17 Edema Assessment Assessed: [name]: Yes] [name]: No] Edema: [Left: Ye] [Right: s] Calf Left: Right: Point of Measurement: 32 cm From Medial Instep 38.5 cm Ankle Left: Right: Point of Measurement: 11 cm From Medial Instep 25.5 cm Vascular Assessment Pulses: Dorsalis Pedis Palpable: [Left:Yes] Electronic Signature(s) Signed: 04/25/2020 6:26:47 PM By: Deon Pilling Entered By: Deon Pilling on 04/25/2020 14:14:57 -------------------------------------------------------------------------------- Multi Wound Chart Details Patient Name: Date of Service: [name] 04/25/2020 1:45 PM Medical Record Number: [id number] Patient Account Number: [id number] Date of Birth/Sex: Treating RN: 06-Aug-1945 (74 y.o. [name] Primary Care Tyne Banta: [name] Other Clinician: Referring Tamitha Norell: Treating Deborah Dondero/Extender: [name] in Treatment: 17 Vital Signs Height(in): 72 Pulse(bpm): 17 Weight(lbs): 270 Blood Pressure(mmHg): 176/70 Body Mass Index(BMI):  37 Temperature(F): 98.3 Respiratory Rate(breaths/min): 18 Photos: [5:No Photos Left Metatarsal head fifth] [N/A:N/A N/A] Wound Location: [5:Gradually Appeared] [N/A:N/A] Wounding Event: [5:Diabetic Wound/Ulcer of the Lower] [N/A:N/A] Primary Etiology: [5:Extremity Cataracts, Coronary Artery Disease, N/A] Comorbid History: [5:Hypertension, Type II Diabetes, Neuropathy 10/31/2019] [N/A:N/A] Date Acquired: [5:17] [N/A:N/A] Weeks of Treatment: [5:Open] [N/A:N/A] Wound Status: [5:1.4x1x0.2] [N/A:N/A] Measurements L x W x D (cm) [5:1.1] [N/A:N/A] A (cm) : rea [5:0.22] [N/A:N/A] Volume (cm) : [5:9.10%] [N/A:N/A] % Reduction in A rea: [5:74.00%] [N/A:N/A] % Reduction in Volume: [5:Grade 3] [N/A:N/A]  Classification: [5:Large] [N/A:N/A] Exudate A mount: [5:Purulent] [N/A:N/A] Exudate Type: [5:yellow, brown, green] [N/A:N/A] Exudate Color: [5:Well defined, not attached] [N/A:N/A] Wound Margin: [5:Large (67-100%)] [N/A:N/A] Granulation A mount: [5:Pink] [N/A:N/A] Granulation Quality: [5:Small (1-33%)] [N/A:N/A] Necrotic A mount: [5:Fat Layer (Subcutaneous Tissue): Yes N/A] Exposed Structures: [5:Fascia: No Tendon: No Muscle: No Joint: No Bone: No Medium (34-66%)] [N/A:N/A] Epithelialization: [5:blistering and maceration with redness N/A] Assessment Notes: [5:noted to left dorsal foot to wound area.] Treatment Notes Electronic Signature(s) Signed: 04/25/2020 6:35:32 PM By: Baruch Gouty RN, BSN Signed: 04/26/2020 12:07:59 PM By: Linton Ham MD Entered By: Linton Ham on 04/25/2020 14:48:59 -------------------------------------------------------------------------------- Multi-Disciplinary Care Plan Details Patient Name: Date of Service: [name] 04/25/2020 1:45 PM Medical Record Number: [id number] Patient Account Number: [id number] Date of Birth/Sex: Treating RN: 13-Jul-1945 (74 y.o. [name] Primary Care Khamari Sheehan: [name] Other Clinician: Referring  Quashaun Lazalde: Treating Samentha Perham/Extender: [name] in Treatment: 17 Active Inactive Nutrition Nursing Diagnoses: Impaired glucose control: actual or potential Potential for alteratiion in Nutrition/Potential for imbalanced nutrition Goals: Patient/caregiver will maintain therapeutic glucose control Date Initiated: 12/22/2019 Target Resolution Date: 05/19/2020 Goal Status: Active Interventions: Assess HgA1c results as ordered upon admission and as needed Treatment Activities: Patient referred to Primary Care Physician for further nutritional evaluation : 12/22/2019 Notes: Soft Tissue Infection Nursing Diagnoses: Impaired tissue integrity Potential for infection: soft tissue Goals: Patient's soft tissue infection will resolve Date Initiated: 03/29/2020 Target Resolution Date: 05/19/2020 Goal Status: Active Interventions: Assess signs and symptoms of infection every visit Provide education on infection Treatment Activities: Culture and sensitivity : 03/29/2020 Education provided on Infection : 03/29/2020 Systemic antibiotics : 03/29/2020 T ordered outside of clinic : 03/29/2020 est Notes: Wound/Skin Impairment Nursing Diagnoses: Impaired tissue integrity Knowledge deficit related to ulceration/compromised skin integrity Goals: Patient/caregiver will verbalize understanding of skin care regimen Date Initiated: 12/22/2019 Target Resolution Date: 05/10/2020 Goal Status: Active Ulcer/skin breakdown will have a volume reduction of 30% by week 4 Date Initiated: 12/22/2019 Date Inactivated: 01/19/2020 Target Resolution Date: 01/19/2020 Goal Status: Unmet Unmet Reason: infection Ulcer/skin breakdown will have a volume reduction of 50% by week 8 Date Initiated: 01/19/2020 Date Inactivated: 02/16/2020 Target Resolution Date: 02/16/2020 Goal Status: Unmet Unmet Reason: infection Ulcer/skin breakdown will have a volume reduction of 80% by week 12 Date Initiated:  02/16/2020 Date Inactivated: 03/15/2020 Target Resolution Date: 03/15/2020 Goal Status: Unmet Unmet Reason: osteo, getting HBO Interventions: Assess patient/caregiver ability to obtain necessary supplies Assess patient/caregiver ability to perform ulcer/skin care regimen upon admission and as needed Assess ulceration(s) every visit Provide education on ulcer and skin care Treatment Activities: Skin care regimen initiated : 12/22/2019 Topical wound management initiated : 12/22/2019 Notes: Electronic Signature(s) Signed: 04/25/2020 6:35:32 PM By: Baruch Gouty RN, BSN Entered By: Baruch Gouty on 04/25/2020 14:34:38 -------------------------------------------------------------------------------- Pain Assessment Details Patient Name: Date of Service: [name] 04/25/2020 1:45 PM Medical Record Number: [id number] Patient Account Number: [id number] Date of Birth/Sex: Treating RN: 1945/07/01 (74 y.o. [name] Primary Care Treniece Holsclaw: [name] Other Clinician: Referring Yaw Escoto: Treating Savier Trickett/Extender: [name] in Treatment: 17 Active Problems Location of Pain Severity and Description of Pain Patient Has Paino No Site Locations Pain Management and Medication Current Pain Management: Electronic Signature(s) Signed: 04/25/2020 6:35:32 PM By: Baruch Gouty RN, BSN Signed: 04/26/2020 10:14:31 AM By: Sandre Kitty Entered By: Sandre Kitty on 04/25/2020 13:52:33 -------------------------------------------------------------------------------- Patient/Caregiver Education Details Patient Name: Date of Service: MIKAYEL, BLOEMER 12/28/2021andnbsp1:45 PM Medical Record Number: [id number] Patient Account Number: [id number] Date  of Birth/Gender: Treating RN: [birth year] (74 y.o. Damaris Schooner Primary Care Physician: Tana Conch Other Clinician: Referring Physician: Treating Physician/Extender: [name] in Treatment: 17 Education Assessment Education Provided To: Patient Education Topics Provided Hyperbaric Oxygenation: Methods: Explain/Verbal Responses: Reinforcements needed, State content correctly Infection: Methods: Explain/Verbal Responses: Reinforcements needed, State content correctly Offloading: Methods: Explain/Verbal Responses: Reinforcements needed, State content correctly Wound/Skin Impairment: Methods: Explain/Verbal Responses: Reinforcements needed, State content correctly Electronic Signature(s) Signed: 04/25/2020 6:35:32 PM By: Zenaida Deed RN, BSN Entered By: Zenaida Deed on 04/25/2020 14:35:24 -------------------------------------------------------------------------------- Wound Assessment Details Patient Name: Date of Service: [name] 04/25/2020 1:45 PM Medical Record Number: [id number] Patient Account Number: [id number] Date of Birth/Sex: Treating RN: 05/15/1945 (74 y.o. Damaris Schooner Primary Care Bryah Ocheltree: Tana Conch Other Clinician: Referring Monay Houlton: Treating Kim Oki/Extender: [name] in Treatment: 17 Wound Status Wound Number: 5 Primary Diabetic Wound/Ulcer of the Lower Extremity Etiology: Wound Location: Left Metatarsal head fifth Wound Open Wounding Event: Gradually Appeared Status: Date Acquired: 10/31/2019 Comorbid Cataracts, Coronary Artery Disease, Hypertension, Type II Weeks Of Treatment: 17 History: Diabetes, Neuropathy Clustered Wound: No Wound Measurements Length: (cm) 1.4 Width: (cm) 1 Depth: (cm) 0.2 Area: (cm) 1.1 Volume: (cm) 0.22 % Reduction in Area: 9.1% % Reduction in Volume: 74% Epithelialization: Medium (34-66%) Tunneling: No Undermining: No Wound Description Classification: Grade 3 Wound Margin: Well defined, not attached Exudate Amount: Large Exudate Type: Purulent Exudate Color: yellow, brown, green Foul Odor After  Cleansing: No Slough/Fibrino Yes Wound Bed Granulation Amount: Large (67-100%) Exposed Structure Granulation Quality: Pink Fascia Exposed: No Necrotic Amount: Small (1-33%) Fat Layer (Subcutaneous Tissue) Exposed: Yes Necrotic Quality: Adherent Slough Tendon Exposed: No Muscle Exposed: No Joint Exposed: No Bone Exposed: No Assessment Notes blistering and maceration with redness noted to left dorsal foot to wound area. Treatment Notes Wound #5 (Metatarsal head fifth) Wound Laterality: Left Cleanser Peri-Wound Care Zinc Oxide Ointment 30g tube Discharge Instruction: Apply Zinc Oxide to periwound with each dressing change Sween Lotion (Moisturizing lotion) Discharge Instruction: Apply moisturizing lotion as directed Topical Primary Dressing KerraCel Ag Gelling Fiber Dressing, 2x2 in (silver alginate) Discharge Instruction: Lightly pack undermining Secondary Dressing Woven Gauze Sponge, Non-Sterile 4x4 in Discharge Instruction: Apply over primary dressing as directed. Optifoam Non-Adhesive Dressing, 4x4 in Discharge Instruction: Foam donut Secured With Elastic Bandage 4 inch (ACE bandage) Discharge Instruction: Secure with ACE bandage as directed. Compression Wrap Compression Stockings Add-Ons Electronic Signature(s) Signed: 04/25/2020 6:26:47 PM By: Shawn Stall Signed: 04/25/2020 6:35:32 PM By: Zenaida Deed RN, BSN Entered By: Shawn Stall on 04/25/2020 14:16:39 -------------------------------------------------------------------------------- Vitals Details Patient Name: Date of Service: [name] 04/25/2020 1:45 PM Medical Record Number: [id number] Patient Account Number: [id number] Date of Birth/Sex: Treating RN: 1946/02/02 (74 y.o. Damaris Schooner Primary Care Naylani Bradner: Tana Conch Other Clinician: Referring Jafari Mckillop: Treating Dnyla Antonetti/Extender: [name] in Treatment: 17 Vital Signs Time Taken: 13:52 Temperature  (F): 98.3 Height (in): 72 Pulse (bpm): 81 Weight (lbs): 270 Respiratory Rate (breaths/min): 18 Body Mass Index (BMI): 36.6 Blood Pressure (mmHg): 176/70 Reference Range: 80 - 120 mg / dl Electronic Signature(s) Signed: 04/26/2020 10:14:31 AM By: Karl Ito Entered By: Karl Ito on 04/25/2020 13:52:28

## 2020-05-01 ENCOUNTER — Encounter (HOSPITAL_BASED_OUTPATIENT_CLINIC_OR_DEPARTMENT_OTHER): Payer: PPO | Admitting: Internal Medicine

## 2020-05-01 ENCOUNTER — Ambulatory Visit: Payer: PPO | Admitting: Infectious Disease

## 2020-05-02 ENCOUNTER — Encounter (HOSPITAL_BASED_OUTPATIENT_CLINIC_OR_DEPARTMENT_OTHER): Payer: PPO | Admitting: Internal Medicine

## 2020-05-02 DIAGNOSIS — H35013 Changes in retinal vascular appearance, bilateral: Secondary | ICD-10-CM | POA: Diagnosis not present

## 2020-05-02 DIAGNOSIS — H43813 Vitreous degeneration, bilateral: Secondary | ICD-10-CM | POA: Diagnosis not present

## 2020-05-02 DIAGNOSIS — H40023 Open angle with borderline findings, high risk, bilateral: Secondary | ICD-10-CM | POA: Diagnosis not present

## 2020-05-02 DIAGNOSIS — E113213 Type 2 diabetes mellitus with mild nonproliferative diabetic retinopathy with macular edema, bilateral: Secondary | ICD-10-CM | POA: Diagnosis not present

## 2020-05-03 ENCOUNTER — Encounter (HOSPITAL_BASED_OUTPATIENT_CLINIC_OR_DEPARTMENT_OTHER): Payer: PPO | Admitting: Physician Assistant

## 2020-05-04 ENCOUNTER — Other Ambulatory Visit: Payer: Self-pay | Admitting: Orthopedic Surgery

## 2020-05-04 ENCOUNTER — Encounter (HOSPITAL_BASED_OUTPATIENT_CLINIC_OR_DEPARTMENT_OTHER): Payer: PPO | Admitting: Internal Medicine

## 2020-05-04 ENCOUNTER — Other Ambulatory Visit: Payer: Self-pay | Admitting: Cardiovascular Disease

## 2020-05-05 ENCOUNTER — Other Ambulatory Visit: Payer: Self-pay

## 2020-05-05 ENCOUNTER — Encounter (HOSPITAL_BASED_OUTPATIENT_CLINIC_OR_DEPARTMENT_OTHER): Payer: PPO | Attending: Internal Medicine | Admitting: Internal Medicine

## 2020-05-05 ENCOUNTER — Encounter (HOSPITAL_BASED_OUTPATIENT_CLINIC_OR_DEPARTMENT_OTHER): Payer: PPO | Admitting: Internal Medicine

## 2020-05-05 DIAGNOSIS — E1142 Type 2 diabetes mellitus with diabetic polyneuropathy: Secondary | ICD-10-CM | POA: Diagnosis not present

## 2020-05-05 DIAGNOSIS — E11621 Type 2 diabetes mellitus with foot ulcer: Secondary | ICD-10-CM | POA: Diagnosis not present

## 2020-05-05 DIAGNOSIS — Z87891 Personal history of nicotine dependence: Secondary | ICD-10-CM | POA: Diagnosis not present

## 2020-05-05 DIAGNOSIS — L97526 Non-pressure chronic ulcer of other part of left foot with bone involvement without evidence of necrosis: Secondary | ICD-10-CM | POA: Insufficient documentation

## 2020-05-05 DIAGNOSIS — L03116 Cellulitis of left lower limb: Secondary | ICD-10-CM | POA: Insufficient documentation

## 2020-05-05 DIAGNOSIS — E1169 Type 2 diabetes mellitus with other specified complication: Secondary | ICD-10-CM | POA: Diagnosis not present

## 2020-05-05 DIAGNOSIS — M869 Osteomyelitis, unspecified: Secondary | ICD-10-CM | POA: Diagnosis not present

## 2020-05-05 DIAGNOSIS — L97522 Non-pressure chronic ulcer of other part of left foot with fat layer exposed: Secondary | ICD-10-CM | POA: Diagnosis not present

## 2020-05-05 NOTE — Progress Notes (Signed)
DAMARCO, KEYSOR ([id number]) Visit Report for 05/05/2020 Arrival Information Details Patient Name: Date of Service: RITIK, STAVOLA 05/05/2020 7:30 A M Medical Record Number: [id number] Patient Account Number: [id number] Date of Birth/Sex: Treating RN: [birth year] (75 y.o. [name] Primary Care Keldon Lassen: [name] Other Clinician: Referring Tarhonda Hollenberg: Treating Hazel Leveille/Extender: [name] in Treatment: 19 Visit Information History Since Last Visit Added or deleted any medications: No Patient Arrived: [name] Any new allergies or adverse reactions: No Arrival Time: 07:49 Had a fall or experienced change in No Accompanied By: self activities of daily living that may affect Transfer Assistance: None risk of falls: Patient Identification Verified: Yes Signs or symptoms of abuse/neglect since last visito No Secondary Verification Process Completed: Yes Hospitalized since last visit: No Patient Requires Transmission-Based Precautions: No Implantable device outside of the clinic excluding No Patient Has Alerts: No cellular tissue based products placed in the center since last visit: Has Dressing in Place as Prescribed: Yes Pain Present Now: Yes Electronic Signature(s) Signed: 05/05/2020 11:05:27 AM By: Sandre Kitty Entered By: Sandre Kitty on 05/05/2020 07:50:53 -------------------------------------------------------------------------------- Clinic Level of Care Assessment Details Patient Name: Date of Service: LLEWELLYN, CHOPLIN 05/05/2020 7:30 A M Medical Record Number: [id number] Patient Account Number: [id number] Date of Birth/Sex: Treating RN: [birth year] (75 y.o. [name] Primary Care [name] [name]: [name] Other Clinician: Referring Madelena Maturin: Treating Alanna Storti/Extender: [name] in Treatment: 19 Clinic Level of Care Assessment Items TOOL 4 Quantity Score []  - 0 Use when only an EandM is  performed on FOLLOW-UP visit ASSESSMENTS - Nursing Assessment / Reassessment X- 1 10 Reassessment of Co-morbidities (includes updates in patient status) X- 1 5 Reassessment of Adherence to Treatment Plan ASSESSMENTS - Wound and Skin A ssessment / Reassessment X - Simple Wound Assessment / Reassessment - one wound 1 5 []  - 0 Complex Wound Assessment / Reassessment - multiple wounds []  - 0 Dermatologic / Skin Assessment (not related to wound area) ASSESSMENTS - Focused Assessment X- 1 5 Circumferential Edema Measurements - multi extremities []  - 0 Nutritional Assessment / Counseling / Intervention X- 1 5 Lower Extremity Assessment (monofilament, tuning fork, pulses) []  - 0 Peripheral Arterial Disease Assessment (using hand held doppler) ASSESSMENTS - Ostomy and/or Continence Assessment and Care []  - 0 Incontinence Assessment and Management []  - 0 Ostomy Care Assessment and Management (repouching, etc.) PROCESS - Coordination of Care X - Simple Patient / Family Education for ongoing care 1 15 []  - 0 Complex (extensive) Patient / Family Education for ongoing care X- 1 10 Staff obtains Programmer, systems, Records, T Results / Process Orders est []  - 0 Staff telephones HHA, Nursing Homes / Clarify orders / etc []  - 0 Routine Transfer to another Facility (non-emergent condition) []  - 0 Routine Hospital Admission (non-emergent condition) []  - 0 New Admissions / Biomedical engineer / Ordering NPWT Apligraf, etc. , []  - 0 Emergency Hospital Admission (emergent condition) X- 1 10 Simple Discharge Coordination []  - 0 Complex (extensive) Discharge Coordination PROCESS - Special Needs []  - 0 Pediatric / Minor Patient Management []  - 0 Isolation Patient Management []  - 0 Hearing / Language / Visual special needs []  - 0 Assessment of Community assistance (transportation, D/C planning, etc.) []  - 0 Additional assistance / Altered mentation []  - 0 Support Surface(s) Assessment  (bed, cushion, seat, etc.) INTERVENTIONS - Wound Cleansing / Measurement X - Simple Wound Cleansing - one wound 1 5 []  - 0 Complex Wound Cleansing - multiple wounds X- 1  5 Wound Imaging (photographs - any number of wounds) []  - 0 Wound Tracing (instead of photographs) X- 1 5 Simple Wound Measurement - one wound []  - 0 Complex Wound Measurement - multiple wounds INTERVENTIONS - Wound Dressings X - Small Wound Dressing one or multiple wounds 1 10 []  - 0 Medium Wound Dressing one or multiple wounds []  - 0 Large Wound Dressing one or multiple wounds X- 1 5 Application of Medications - topical []  - 0 Application of Medications - injection INTERVENTIONS - Miscellaneous []  - 0 External ear exam []  - 0 Specimen Collection (cultures, biopsies, blood, body fluids, etc.) []  - 0 Specimen(s) / Culture(s) sent or taken to Lab for analysis []  - 0 Patient Transfer (multiple staff / Harrel Lemon Lift / Similar devices) []  - 0 Simple Staple / Suture removal (25 or less) []  - 0 Complex Staple / Suture removal (26 or more) []  - 0 Hypo / Hyperglycemic Management (close monitor of Blood Glucose) []  - 0 Ankle / Brachial Index (ABI) - do not check if billed separately X- 1 5 Vital Signs Has the patient been seen at the hospital within the last three years: Yes Total Score: 100 Level Of Care: New/Established - Level 3 Electronic Signature(s) Signed: 05/05/2020 4:46:42 PM By: Baruch Gouty RN, BSN Entered By: Baruch Gouty on 05/05/2020 08:32:32 -------------------------------------------------------------------------------- Encounter Discharge Information Details Patient Name: Date of Service: [name] 05/05/2020 7:30 A M Medical Record Number: [id number] Patient Account Number: [id number] Date of Birth/Sex: Treating RN: [birth year] (75 y.o. [name] Primary Care Kaycen Whitworth: [name] Other Clinician: Referring Osmel Dykstra: Treating Medea Deines/Extender: [name] in Treatment: 19 Encounter Discharge Information Items Discharge Condition: Stable Ambulatory Status: Cane Discharge Destination: Home Transportation: Private Auto Accompanied By: self Schedule Follow-up Appointment: Yes Clinical Summary of Care: Electronic Signature(s) Signed: 05/05/2020 4:33:40 PM By: Deon Pilling Entered By: Deon Pilling on 05/05/2020 10:19:25 -------------------------------------------------------------------------------- Lower Extremity Assessment Details Patient Name: Date of Service: ARFAN, JILES 05/05/2020 7:30 A M Medical Record Number: [id number] Patient Account Number: [id number] Date of Birth/Sex: Treating RN: 02-25-1946 (75 y.o. [name] Primary Care Tarl Cephas: [name] Other Clinician: Referring Dandria Griego: Treating Brooklin Rieger/Extender: [name] in Treatment: 19 Edema Assessment Assessed: [name]: No] [name]: No] Edema: [Left: Ye] [Right: s] Calf Left: Right: Point of Measurement: 32 cm From Medial Instep 40.5 cm Ankle Left: Right: Point of Measurement: 11 cm From Medial Instep 28.7 cm Vascular Assessment Pulses: Dorsalis Pedis Palpable: [Left:No] Electronic Signature(s) Signed: 05/05/2020 4:46:42 PM By: Baruch Gouty RN, BSN Entered By: Baruch Gouty on 05/05/2020 07:58:27 -------------------------------------------------------------------------------- Multi Wound Chart Details Patient Name: Date of Service: [name] 05/05/2020 7:30 A M Medical Record Number: [id number] Patient Account Number: [id number] Date of Birth/Sex: Treating RN: 01/29/1946 (75 y.o. [name] Primary Care Jerron Niblack: [name] Other Clinician: Referring Aubrionna Istre: Treating Arminda Foglio/Extender: [name] in Treatment: 19 Vital Signs Height(in): 72 Pulse(bpm): 3 Weight(lbs): 270 Blood Pressure(mmHg): 181/77 Body Mass Index(BMI): 37 Temperature(F):  98.2 Respiratory Rate(breaths/min): 18 Photos: [5:No Photos Left Metatarsal head fifth] [N/A:N/A N/A] Wound Location: [5:Gradually Appeared] [N/A:N/A] Wounding Event: [5:Diabetic Wound/Ulcer of the Lower] [N/A:N/A] Primary Etiology: [5:Extremity Cataracts, Coronary Artery Disease, N/A] Comorbid History: [5:Hypertension, Type II Diabetes, Neuropathy 10/31/2019] [N/A:N/A] Date Acquired: [5:19] [N/A:N/A] Weeks of Treatment: [5:Open] [N/A:N/A] Wound Status: [5:0.6x0.7x0.6] [N/A:N/A] Measurements L x W x D (cm) [5:0.33] [N/A:N/A] A (cm) : rea [5:0.198] [N/A:N/A] Volume (cm) : [5:72.70%] [N/A:N/A] % Reduction in A rea: [5:76.60%] [N/A:N/A] %  Reduction in Volume: [5:12] Starting Position 1 (o'clock): [5:12] Ending Position 1 (o'clock): [5:1.2] Maximum Distance 1 (cm): [5:Yes] [N/A:N/A] Undermining: [5:Grade 3] [N/A:N/A] Classification: [5:Medium] [N/A:N/A] Exudate A mount: [5:Serosanguineous] [N/A:N/A] Exudate Type: [5:red, brown] [N/A:N/A] Exudate Color: [5:Well defined, not attached] [N/A:N/A] Wound Margin: [5:Small (1-33%)] [N/A:N/A] Granulation A mount: [5:Red, Pink] [N/A:N/A] Granulation Quality: [5:Medium (34-66%)] [N/A:N/A] Necrotic A mount: [5:Fat Layer (Subcutaneous Tissue): Yes N/A] Exposed Structures: [5:Bone: Yes Fascia: No Tendon: No Muscle: No Joint: No None] [N/A:N/A] Treatment Notes Electronic Signature(s) Signed: 05/05/2020 4:46:42 PM By: Baruch Gouty RN, BSN Signed: 05/05/2020 4:48:18 PM By: Linton Ham MD Entered By: Linton Ham on 05/05/2020 08:40:35 -------------------------------------------------------------------------------- Multi-Disciplinary Care Plan Details Patient Name: Date of Service: [name] 05/05/2020 7:30 A M Medical Record Number: [id number] Patient Account Number: [id number] Date of Birth/Sex: Treating RN: 1946-02-10 (75 y.o. [name] Primary Care Daquavion Catala: [name] Other Clinician: Referring Kayci [place]: Treating  Ashleymarie Granderson/Extender: [name] in Treatment: 19 Active Inactive Nutrition Nursing Diagnoses: Impaired glucose control: actual or potential Potential for alteratiion in Nutrition/Potential for imbalanced nutrition Goals: Patient/caregiver will maintain therapeutic glucose control Date Initiated: 12/22/2019 Target Resolution Date: 05/19/2020 Goal Status: Active Interventions: Assess HgA1c results as ordered upon admission and as needed Treatment Activities: Patient referred to Primary Care Physician for further nutritional evaluation : 12/22/2019 Notes: Osteomyelitis Nursing Diagnoses: Infection: osteomyelitis Knowledge deficit related to disease process and management Goals: Patient's osteomyelitis will resolve Date Initiated: 05/05/2020 Target Resolution Date: 06/02/2020 Goal Status: Active Interventions: Assess for signs and symptoms of osteomyelitis resolution every visit Provide education on osteomyelitis Screen for HBO Treatment Activities: Consult for HBO : 05/05/2020 MRI : 04/17/2020 Systemic antibiotics : 05/05/2020 Notes: Soft Tissue Infection Nursing Diagnoses: Impaired tissue integrity Potential for infection: soft tissue Goals: Patient's soft tissue infection will resolve Date Initiated: 03/29/2020 Target Resolution Date: 05/19/2020 Goal Status: Active Interventions: Assess signs and symptoms of infection every visit Provide education on infection Treatment Activities: Culture and sensitivity : 03/29/2020 Education provided on Infection : 04/25/2020 Systemic antibiotics : 03/29/2020 T ordered outside of clinic : 03/29/2020 est Notes: Wound/Skin Impairment Nursing Diagnoses: Impaired tissue integrity Knowledge deficit related to ulceration/compromised skin integrity Goals: Patient/caregiver will verbalize understanding of skin care regimen Date Initiated: 12/22/2019 Target Resolution Date: 05/10/2020 Goal Status: Active Ulcer/skin  breakdown will have a volume reduction of 30% by week 4 Date Initiated: 12/22/2019 Date Inactivated: 01/19/2020 Target Resolution Date: 01/19/2020 Goal Status: Unmet Unmet Reason: infection Ulcer/skin breakdown will have a volume reduction of 50% by week 8 Date Initiated: 01/19/2020 Date Inactivated: 02/16/2020 Target Resolution Date: 02/16/2020 Goal Status: Unmet Unmet Reason: infection Ulcer/skin breakdown will have a volume reduction of 80% by week 12 Date Initiated: 02/16/2020 Date Inactivated: 03/15/2020 Target Resolution Date: 03/15/2020 Goal Status: Unmet Unmet Reason: osteo, getting HBO Interventions: Assess patient/caregiver ability to obtain necessary supplies Assess patient/caregiver ability to perform ulcer/skin care regimen upon admission and as needed Assess ulceration(s) every visit Provide education on ulcer and skin care Treatment Activities: Skin care regimen initiated : 12/22/2019 Topical wound management initiated : 12/22/2019 Notes: Electronic Signature(s) Signed: 05/05/2020 4:46:42 PM By: Baruch Gouty RN, BSN Entered By: Baruch Gouty on 05/05/2020 07:54:31 -------------------------------------------------------------------------------- Pain Assessment Details Patient Name: Date of Service: [name] 05/05/2020 7:30 A M Medical Record Number: [id number] Patient Account Number: [id number] Date of Birth/Sex: Treating RN: 1946/02/09 (75 y.o. [name] Primary Care Kavir Savoca: [name] Other Clinician: Referring Aroldo Galli: Treating Rabiah Goeser/Extender: [name] in [id number] Active Problems  Location of Pain Severity and Description of Pain Patient Has Paino No Site Locations Pain Management and Medication Current Pain Management: Electronic Signature(s) Signed: 05/05/2020 11:05:27 AM By: Sandre Kitty Signed: 05/05/2020 4:46:42 PM By: Baruch Gouty RN, BSN Entered By: Sandre Kitty on 05/05/2020  07:52:15 -------------------------------------------------------------------------------- Patient/Caregiver Education Details Patient Name: Date of Service: [name] 1/7/2022andnbsp7:30 [place] Record Number: [id number] Patient Account Number: [id number] Date of Birth/Gender: Treating RN: 01-06-1946 (75 y.o. [name] Primary Care Physician: [name] Other Clinician: Referring Physician: Treating Physician/Extender: [name] in Treatment: 19 Education Assessment Education Provided To: Patient Education Topics Provided Infection: Methods: Explain/Verbal Responses: Reinforcements needed, State content correctly Offloading: Methods: Explain/Verbal Responses: Reinforcements needed, State content correctly Wound/Skin Impairment: Methods: Explain/Verbal Responses: Reinforcements needed, State content correctly Electronic Signature(s) Signed: 05/05/2020 4:46:42 PM By: Baruch Gouty RN, BSN Entered By: Baruch Gouty on 05/05/2020 07:55:55 -------------------------------------------------------------------------------- Wound Assessment Details Patient Name: Date of Service: [name] 05/05/2020 7:30 A M Medical Record Number: [id number] Patient Account Number: [id number] Date of Birth/Sex: Treating RN: 04-21-1946 (75 y.o. [name] Primary Care Alexa Golebiewski: [name] Other Clinician: Referring Kaysin Brock: Treating Reylynn Vanalstine/Extender: [name] in Treatment: 19 Wound Status Wound Number: 5 Primary Diabetic Wound/Ulcer of the Lower Extremity Etiology: Wound Location: Left Metatarsal head fifth Wound Open Wounding Event: Gradually Appeared Status: Date Acquired: 10/31/2019 Comorbid Cataracts, Coronary Artery Disease, Hypertension, Type II Weeks Of Treatment: 19 History: Diabetes, Neuropathy Clustered Wound: No Wound Measurements Length: (cm) 0.6 Width: (cm) 0.7 Depth: (cm)  0.6 Area: (cm) 0.33 Volume: (cm) 0.198 % Reduction in Area: 72.7% % Reduction in Volume: 76.6% Epithelialization: None Tunneling: No Undermining: Yes Starting Position (o'clock): 12 Ending Position (o'clock): 12 Maximum Distance: (cm) 1.2 Wound Description Classification: Grade 3 Wound Margin: Well defined, not attached Exudate Amount: Medium Exudate Type: Serosanguineous Exudate Color: red, brown Foul Odor After Cleansing: No Slough/Fibrino Yes Wound Bed Granulation Amount: Small (1-33%) Exposed Structure Granulation Quality: Red, Pink Fascia Exposed: No Necrotic Amount: Medium (34-66%) Fat Layer (Subcutaneous Tissue) Exposed: Yes Necrotic Quality: Adherent Slough Tendon Exposed: No Muscle Exposed: No Joint Exposed: No Bone Exposed: Yes Treatment Notes Wound #5 (Metatarsal head fifth) Wound Laterality: Left Cleanser Peri-Wound Care Zinc Oxide Ointment 30g tube Discharge Instruction: Apply Zinc Oxide to periwound with each dressing change as needed for maceration Sween Lotion (Moisturizing lotion) Discharge Instruction: Apply moisturizing lotion as directed Topical Primary Dressing KerraCel Ag Gelling Fiber Dressing, 2x2 in (silver alginate) Discharge Instruction: Lightly pack into undermining Secondary Dressing Woven Gauze Sponge, Non-Sterile 4x4 in Discharge Instruction: Apply over primary dressing as directed. Optifoam Non-Adhesive Dressing, 4x4 in Discharge Instruction: Foam donut Secured With Elastic Bandage 4 inch (ACE bandage) Discharge Instruction: Secure with ACE bandage as directed. Kerlix Roll Sterile, 4.5x3.1 (in/yd) Discharge Instruction: Secure with Kerlix as directed. Compression Wrap Compression Stockings Add-Ons Electronic Signature(s) Signed: 05/05/2020 4:46:42 PM By: Baruch Gouty RN, BSN Entered By: Baruch Gouty on 05/05/2020 08:03:20 -------------------------------------------------------------------------------- Vitals  Details Patient Name: Date of Service: [name] 05/05/2020 7:30 A M Medical Record Number: [id number] Patient Account Number: [id number] Date of Birth/Sex: Treating RN: March 04, 1946 (75 y.o. [name] Primary Care Sunny Aguon: [name] Other Clinician: Referring Tashanti Dalporto: Treating Tayveon Lombardo/Extender: [name] in Treatment: 19 Vital Signs Time Taken: 07:51 Temperature (F): 98.2 Height (in): 72 Pulse (bpm): 83 Weight (lbs): 270 Respiratory Rate (breaths/min): 18 Body Mass Index (BMI): 36.6 Blood Pressure (mmHg): 181/77 Reference Range: 80 - 120 mg /  dl Electronic Signature(s) Signed: 05/05/2020 11:05:27 AM By: Sandre Kitty Entered By: Sandre Kitty on 05/05/2020 07:52:09

## 2020-05-05 NOTE — Progress Notes (Signed)
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 osteomyelitis. However I do not think it is a direct side effect of the medication itself per se. 02/23/2020 upon evaluation the patient appears to be doing decently well in regard to his foot ulcer in fact I  am very pleased with how things appear today. He does have less undermining and overall I think between the cast and now the start of the hyperbaric oxygen therapy he has a very good chance of getting this wound to heal. Fortunately there is no signs of active infection at this time which is great news. No fevers, chills, nausea, vomiting, or diarrhea. He does note that he has been having some issues with the linezolid causing him to be nauseated and he also states that it is caused him to lose his smell and taste. With that being said I am really not certain that this is indeed the reason behind this but either way I think we can switch the antibiotic being that he is looking so well with minimal linezolid for close to 3-4 weeks at this point. We will do 2 weeks of Bactrim and then hopefully he should be complete with the antibiotic courses. 03/01/2020 on evaluation today patient is making good progress in regard to his wound. This is measuring smaller today  and overall very pleased. The antibiotics which has seemed to help him he states that he is having a lot of improvement overall in his smell and taste as well as improvement in the overall nausea that he was experiencing with the linezolid. Obviously this is great news. 03/08/2020 upon evaluation today patient appears to be doing well with regard to his foot ulcer. I feel like this is showing signs of improvement it is measuring a little bit better today compared to previous. With that being said there is no evidence of active infection which is great news and in general I feel like the hyperbarics is helping him along with the antibiotics which she will be completing I believe in the next week. Outside of this also feel like that the total contact cast obviously has been of benefit. 03/15/2020 on evaluation today patient appears to be doing well in regard to his foot ulcer. He has been tolerating the dressing changes without  complication. Fortunately there is no signs of active infection at this time. No fevers, chills, nausea, vomiting, or diarrhea. Patient is tolerating hyperbarics quite well at this time and I see no signs of infection currently which is great news. 03/22/2020 on evaluation today patient appears to be doing about the same in regard to his foot ulcer. He does have some tissue along the medial portion of the wound bed and I am unsure of exactly what this is just characteristically. It could be a small amount of tendon here to be honest. With that being said I do think it needs to be removed as I feel like it is hindering his healing possibilities. 11/29; acute visit; I was asked to see the patient urgently today because of complaints of pain in his foot. He is a wound in the right fifth metatarsal head plantar aspect with underlying osteomyelitis. He has been undergoing hyperbaric oxygen treatment completing antibiotics in the middle of this month. He said the pain had increased to the point that it was becoming difficult for him to walk on his foot. He has not been systemically unwell.. He had a fairly significant debridement on his last visit 5 days ago. 03/29/2020 on evaluation today patient appears to be doing much better than he was on Monday according to the pictures and what I saw. Fortunately there is no signs of active infection at this time which is great news. There is no evidence of systemic infection either. Overall I feel like the patient is doing indeed much better and is just a matter of having him continue the antibiotic for the time being in my opinion and then depending on the results of the culture we may extend that for a bit longer. 12/10; I am seeing the patient today because of difficulty had this week with his wife's illness. He could not come on Wednesday. The wound looks a lot better than what I usually am used to seeing. He is using silver alginate and a total contact cast  continuing with HBO. He has completed his antibiotics cultures and x-rays were negative 04/12/2020 upon evaluation today patient unfortunately appears to be doing a little worse in regard to his wound from the standpoint of erythema surrounding. There does not appear to be any signs of active systemic infection which is great news. Nonetheless I am concerned about the fact that if we put him back in the cast he will not be able to visually see what was going on in regard to his foot and  if anything is worsening he would have no idea into it could be potentially too late. 12/17; surrounding his dive yesterday afternoon it was brought to my attention about the extent of swelling in the left leg. I really did not have enough time to fully evaluate this before the end of his treatment so I arranged to have a duplex ultrasound done rule out DVT which thankfully turned out to be negative. I note that he was felt to have cellulitis of the left foot surrounding his wound when he was seen on Wednesday by Jeri Cos and was put on trimethoprim sulfamethoxazole. His report showed no evidence of a DVT or SVT . I am seeing him today in follow-up of this. As it turns out his calf measurements have not really changed that much but he has much more swelling in the ankle. It could be because we did not put a total contact cast back on him. I think he has some degree of chronic venous insufficiency. 12/20; I wanted to look at the patient's leg when he was up on the table before he went into hyperbarics however he was put on the schedule today. He is still taking trimethoprim sulfamethoxazole. Culture that was done last Wednesday is negative. We put him in kerlix Coban wrap over the weekend still a lot of swelling in this leg. DVT rule out was negative there is no swelling in his thigh not sure I really have a good explanation for this. He is not complaining of systemic symptoms 04/19/2020 upon evaluation today patient  appears to be doing somewhat poorly in regard to his foot ulcer. Again he has seen Dr. Dellia Nims Dr. Dellia Nims did order repeat MRI after I agree with this and think that is the right way to go at this point. Depending on the results of the MRI I discussed with the patient today that we may be looking at a referral potentially for surgery consultation if this occurs the patient would like to see someone for second opinion other than Dr. Sharol Given I understand and I think we can definitely do that I have never discourage patients from second opinions. With that being said currently I do not see any evidence of worsening infection though there still appears to be significant erythema. The patient's culture did show that he is on the appropriate antibiotic with the Bactrim DS which I did prescribe for him last week he has another week of this remaining. I think he can continue to take that for the time being. 12/28; patient comes back in the clinic today with a lot of drainage on the outside of his left dorsal foot. Very significant erythema on the lateral part of the foot and dorsal part of the foot which I have marked with a marker. Also have his MRI to review that showed findings suspicious for progressive osteomyelitis involving the fifth metatarsal head and proximal phalanx no drainable fluid collection. This is obviously worse since his last study in September. He has not been systemically unwell. Says he is not running a fever his blood sugars are stable. 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 For this reason he is completely opposed to any surgery presently. Furthermore his arterial status might not allow foot conserving surgery. I have reviewed his arterial studies from 9/1. This showed an ABI on the left of 0.90 however his TBI was only 0.44 but a toe pressure of 68. Monophasic waveforms. I wonder if he has tibial  artery calcification and his ABIs are actually falsely  elevated. 1/7; 1 week follow-up. Patient has completed the linezolid I gave him for MRSA cellulitis surrounding the wound in the left foot. This extended into his foot dorsally. This looks a lot better today and he has completed his antibiotics. He did not see infectious disease on Monday because her office was closed due to the weather however he has an appointment on Monday next. I have rediscussed his arterial status and I think the best thing to do would be to send him to an interventionalists. The patient has a good personal relationship with Dr. Kathlene Cote of interventional radiology and will send him there for either repeat noninvasive studies or consideration of an angiogram. If we were to consider foot conserving surgery [ray amputation] he will need to have the vascular status to heal this. He is certainly not going to consider a below-knee amputation at this point. I am hoping to get him started on oIV antibiotics through infectious disease. 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 Objective Constitutional Patient is hypertensive.. Pulse regular and within target range for patient.Marland Kitchen Respirations regular, non-labored and within target range.. Temperature is normal and within the target range for the patient.Marland Kitchen Appears in no distress. Vitals Time Taken: 7:51 AM, Height: 72 in, Weight: 270 lbs, BMI: 36.6, Temperature: 98.2 F, Pulse: 83 bpm, Respiratory Rate: 18 breaths/min, Blood Pressure: 181/77 mmHg. Cardiovascular Dorsalis pedis pulses faintly palpable. General Notes: Wound exam; the area on the plantar fifth metatarsal head. There is very little tissue over the bone here but no exposed bone. The intensity of the cellulitis on the lateral part of his foot and extending dorsally is considerably better/resolved today. Integumentary (Hair, Skin) Wound #5 status is Open. Original cause of wound was Gradually  Appeared. The wound is located on the Left Metatarsal head fifth. The wound measures 0.6cm length x 0.7cm width x 0.6cm depth; 0.33cm^2 area and 0.198cm^3 volume. There is bone and Fat Layer (Subcutaneous Tissue) exposed. There is no tunneling noted, however, there is undermining starting at 12:00 and ending at 12:00 with a maximum distance of 1.2cm. There is a medium amount of serosanguineous drainage noted. The wound margin is well defined and not attached to the wound base. There is small (1-33%) red, pink granulation within the wound bed. There is a medium (34-66%) amount of necrotic tissue within the wound bed including Adherent Slough. 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 Bathing/ Shower/ Hygiene: May shower with protection but do not get wound dressing(s) wet. Edema Control - Lymphedema / SCD / Other: Elevate legs to the level of the heart or above for 30 minutes daily and/or when sitting, a frequency of: Avoid standing for long periods of time. Patient to wear own compression stockings every day. Moisturize legs daily. Off-Loading: Wedge shoe to: - front offloading shoe with felt to offload lateral foot Additional Orders / Instructions: Other: - Go to emergency room for fever, chills, increased redness, increased pain in left foot Consults ordered were: interventional Radiology - Dr. Kathlene Cote for evaluation of arterial insufficiency left lower leg with wagner grade 3 diabetic foot ulcer left 5th  met head WOUND #5: - Metatarsal head fifth Wound Laterality: Left Peri-Wound Care: Zinc Oxide Ointment 30g tube 1 x Per Day/30 Days Discharge Instructions: Apply  Zinc Oxide to periwound with each dressing change as needed for maceration Peri-Wound Care: Sween Lotion (Moisturizing lotion) 1 x Per Day/30 Days Discharge Instructions: Apply moisturizing lotion as directed Prim Dressing: KerraCel Ag Gelling Fiber Dressing, 2x2 in (silver alginate) 1 x Per Day/30 Days ary Discharge Instructions: Lightly pack into undermining Secondary Dressing: Woven Gauze Sponge, Non-Sterile 4x4 in 1 x Per Day/30 Days Discharge Instructions: Apply over primary dressing as directed. Secondary Dressing: Optifoam Non-Adhesive Dressing, 4x4 in 1 x Per Day/30 Days Discharge Instructions: Foam donut Secured With: Elastic Bandage 4 inch (ACE bandage) 1 x Per Day/30 Days Discharge Instructions: Secure with ACE bandage as directed. Secured With: [employer], 4.5x3.1 (in/yd) 1 x Per Day/30 Days Discharge Instructions: Secure with Kerlix as directed. 1. Continue silver alginate 2. Offloading as best he can 3. Infectious disease consideration of additional antibiotics for his progressive osteomyelitis 4. Intense cellulitis of the left foot responded to the linezolid he has completed antibiotics 5. I think is perfectly reasonable to send him to see interventional radiology with regards to his vascular status. He says he has a relationship with Dr. Kathlene Cote and we will try to set this up for him. If we were going to try to do a ray amputation here we want to make sure week he has adequate flow to heal this 6. 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 05/05/2020 Medical Record Number: [id number] Patient Account Number: [id number] Date of Birth/Sex:  Treating RN: April 07, 1946 (75 y.o. [name] Primary Care Provider: [name] Other Clinician: Referring Provider: Treating Provider/Extender: [name] in Treatment: 19 Diagnosis Coding ICD-10 Codes Code Description E11.621 Type 2 diabetes mellitus with foot ulcer L97.522 Non-pressure chronic ulcer of other part of left foot with fat layer exposed M86.272 Subacute osteomyelitis, left ankle and foot L03.116 Cellulitis of left lower limb E11.51 Type 2 diabetes mellitus with diabetic peripheral angiopathy without gangrene Facility Procedures CPT4 Code: [id number] Description: 99213 - WOUND CARE VISIT-LEV 3 EST PT Modifier: Quantity: 1 Physician Procedures : CPT4 Code Description Modifier [id number] - WC PHYS LEVEL 4 - EST PT ICD-10 Diagnosis Description E11.621 Type 2 diabetes mellitus with foot ulcer L97.522 Non-pressure chronic ulcer of other part of left foot with fat layer exposed M86.272 Subacute  osteomyelitis, left ankle and foot E11.51 Type 2 diabetes mellitus with diabetic peripheral angiopathy without gangrene Quantity: 1 Electronic Signature(s) Signed: 05/05/2020 4:48:18 PM By: Linton Ham MD Entered By: Linton Ham on 05/05/2020 08:47:59

## 2020-05-08 ENCOUNTER — Other Ambulatory Visit: Payer: Self-pay

## 2020-05-08 ENCOUNTER — Ambulatory Visit (INDEPENDENT_AMBULATORY_CARE_PROVIDER_SITE_OTHER): Payer: PPO | Admitting: Infectious Disease

## 2020-05-08 ENCOUNTER — Encounter: Payer: Self-pay | Admitting: Infectious Disease

## 2020-05-08 ENCOUNTER — Telehealth: Payer: Self-pay

## 2020-05-08 VITALS — BP 186/85 | HR 89 | Temp 98.3°F | Wt 264.0 lb

## 2020-05-08 DIAGNOSIS — E11621 Type 2 diabetes mellitus with foot ulcer: Secondary | ICD-10-CM

## 2020-05-08 DIAGNOSIS — I2510 Atherosclerotic heart disease of native coronary artery without angina pectoris: Secondary | ICD-10-CM

## 2020-05-08 DIAGNOSIS — M869 Osteomyelitis, unspecified: Secondary | ICD-10-CM

## 2020-05-08 DIAGNOSIS — J410 Simple chronic bronchitis: Secondary | ICD-10-CM

## 2020-05-08 DIAGNOSIS — Z1620 Resistance to unspecified antibiotic: Secondary | ICD-10-CM

## 2020-05-08 DIAGNOSIS — L97529 Non-pressure chronic ulcer of other part of left foot with unspecified severity: Secondary | ICD-10-CM

## 2020-05-08 DIAGNOSIS — B9562 Methicillin resistant Staphylococcus aureus infection as the cause of diseases classified elsewhere: Secondary | ICD-10-CM

## 2020-05-08 DIAGNOSIS — I739 Peripheral vascular disease, unspecified: Secondary | ICD-10-CM

## 2020-05-08 HISTORY — DX: Osteomyelitis, unspecified: M86.9

## 2020-05-08 HISTORY — DX: Simple chronic bronchitis: J41.0

## 2020-05-08 NOTE — Telephone Encounter (Signed)
[facility] Patient Advocate Encounter   Received notification from [facility] that prior authorization for [facility] is required.   PA submitted on 05/08/20 [place] [id number] Status is pending    [facility] will continue to follow.   [name], [facility] Patient [facility] Phone: [phone] Fax:  [phone]

## 2020-05-08 NOTE — Progress Notes (Signed)
Reason for infectious disease consult diabetic foot ulcer with osteomyelitis  Requesting Physician: Dr. Dellia Nims  Subjective:    Patient ID: Martin Agee Sr., male    DOB: 03-02-1946, 75 y.o.   MRN: [id number]  HPI   Martin [name] is a 75 year old Caucasian man with a past medical history significant for coronary artery disease status post coronary bypass grafting, diabetes mellitus peripheral artery disease who has been struggling with an ulcer in his foot for several years now.   Fortunately his developed osteomyelitis.  He was referred to Dr. Sharol Given who offered him surgery but the patient was not ready to do this yet.  There is also concerned about his ability to heal any surgery in his foot due to his peripheral vascular disease.  He may in fact require a proximal amputation such as a below the knee amputation for cure.  At present he is in a very difficult situation socially and that he is caring for his wife and is the sole caregiver.  His wife has metastatic breast cancer that metastasized to bone and she is currently bedbound.  He is not ready to have any type of surgery including surgery on the foot at this point in time but wants to see his wife improving first over more resolution on not from prior to allowing himself to be hospitalized for a surgical intervention.  He has been followed closely by the wound care clinic and Dr. Dellia Nims who gave him a prescription for Zyvox when the patient developed erythema that went up his leg and swelling.  He responded well to Zyvox he also had had DVT excluded through duplex.  He has had MRSA isolated from cultures from the wound center which has been resistant to tetracycline clindamycin fluoroquinolones but sensitive to Bactrim and vancomycin  The erythema in his leg is gone down considerably since he is received his course of Zyvox.  Does have an active cough which has been present for 5 weeks I was initially going to test him for COVID-19  but given the chronicity of his symptoms it seems unlikely that he would have now acute infectious Covid.     Past Medical History:  Diagnosis Date  . A-fib ([place])   . Diabetes mellitus   . Heart attack ([place]) 10/01/2018   Pt had open heart surgery  . Hypertension   . 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 Simple chronic bronchitis ([place]) 05/08/2020  . Tobacco abuse     Past Surgical History:  Procedure Laterality Date  . CORONARY ARTERY BYPASS GRAFT N/A 10/01/2018   Procedure: CORONARY ARTERY BYPASS GRAFTING (CABG) TIMES  FOUR USING LEFT MAMMARY ARTERY AND RIGHT GREATER SAPHEANOUS VEIN HARVESTED ENDOSCOPICALLY;  Surgeon: Melrose Nakayama, MD;  Location: [facility];  Service: Open Heart Surgery;  Laterality: N/A;  . HERNIA REPAIR     >10 years ago  . IR RADIOLOGIST EVAL & MGMT  12/24/2016  . IR THORACENTESIS ASP PLEURAL SPACE W/IMG GUIDE  10/06/2018  . LEFT HEART CATH AND CORONARY ANGIOGRAPHY N/A 09/29/2018   Procedure: LEFT HEART CATH AND CORONARY ANGIOGRAPHY;  Surgeon: Troy Sine, MD;  Location: [facility];  Service: Cardiovascular;  Laterality: N/A;  . open heart surfery    . right shoulder surgery     around 2014  . TEE WITHOUT CARDIOVERSION N/A 10/01/2018   Procedure: TRANSESOPHAGEAL ECHOCARDIOGRAM (TEE);  Surgeon: Melrose Nakayama, MD;  Location: [name];  Service: Open Heart Surgery;  Laterality: N/A;    Family History  Problem Relation Age of Onset  . Hypertension Mother   . Heart disease Mother        CHF did not see doctor  . Diabetes Maternal Grandmother   . Diabetes Son       Social History   Socioeconomic History  . Marital status: Married    Spouse name: Not on file  . Number of children: Not on file  . Years of education: Not on file  . Highest education level: Not on file  Occupational History  . Not on file  Tobacco Use  . Smoking status: Former Smoker    Packs/day: 0.75    Years: 2.00    Pack years: 1.50    Types:  Cigarettes    Start date: 2002    Quit date: 04/29/2002    Years since quitting: 18.0  . Smokeless tobacco: Never Used  . Tobacco comment: smoked for 10 years on and off  Substance and Sexual Activity  . Alcohol use: Yes    Alcohol/week: 0.0 standard drinks    Comment: 6 martinis a year  . Drug use: No  . Sexual activity: Yes  Other Topics Concern  . Not on file  Social History Narrative   Married. 3 kids. 7 grandkids. No greatgrandkids.       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 Simvastatin Diarrhea  . Statins Other (See Comments)    achey joints     Current Outpatient Medications:  .  aspirin 81 MG EC tablet, Take 1 tablet (81 mg total) by mouth daily., Disp: , Rfl:  .  ezetimibe (ZETIA) 10 MG tablet, TAKE 1 TABLET BY MOUTH EVERY DAY, Disp: 90 tablet, Rfl: 0 .  fexofenadine (ALLEGRA) 180 MG tablet, Take 180 mg by mouth daily., Disp: , Rfl:  .  fluticasone (FLONASE) 50 MCG/ACT nasal spray, SPRAY 2 SPRAYS INTO EACH NOSTRIL EVERY DAY, Disp: 48 mL, Rfl: 1 .  insulin aspart (NOVOLOG FLEXPEN) 100 UNIT/ML FlexPen, 3 times a day (just before each meal) 25-20-25 units, and pen needles 4/day, Disp: 25 pen, Rfl: 3 .  insulin glargine (LANTUS SOLOSTAR) 100 UNIT/ML Solostar Pen, Inject 10 Units into the skin at bedtime., Disp: 5 pen, Rfl: PRN .  Insulin Pen Needle (NOVOFINE) 32G X 6 MM MISC, USE 5 TIMES DAILY, Disp: 450 each, Rfl: 2 .  linezolid (ZYVOX) 600 MG tablet, Take 600 mg by mouth 2 (two) times daily., Disp: , Rfl:  .  losartan (COZAAR) 25 MG tablet, TAKE 1/2 TABLET BY MOUTH EVERY DAY, Disp: 45 tablet, Rfl: 1 .  metoprolol tartrate (LOPRESSOR) 50  MG tablet, TAKE 1 TABLET BY MOUTH TWICE A DAY, Disp: 180 tablet, Rfl: 1 .  ONETOUCH VERIO test strip, CHECK LEVELS 4 TIMES DAILY, Disp: 150 strip, Rfl: 1 .  Pitavastatin Calcium 1 MG TABS, Take by mouth daily., Disp: , Rfl:  .  torsemide (DEMADEX) 20 MG tablet, TAKE 2 TABLETS BY MOUTH EVERY DAY, Disp: 180 tablet, Rfl: 0 .  vitamin C (ASCORBIC ACID) 250 MG tablet, Take 250 mg by mouth daily., Disp: ,  Rfl:    Review of Systems  Constitutional: Negative for activity change, appetite change, chills, diaphoresis, fatigue, fever and unexpected weight change.  HENT: Negative for congestion, rhinorrhea, sinus pressure, sneezing, sore throat and trouble swallowing.   Eyes: Negative for photophobia and visual disturbance.  Respiratory: Negative for cough, chest tightness, shortness of breath, wheezing and stridor.   Cardiovascular: Negative for chest pain, palpitations and leg swelling.  Gastrointestinal: Negative for abdominal distention, abdominal pain, anal bleeding, blood in stool, constipation, diarrhea, nausea and vomiting.  Genitourinary: Negative for difficulty urinating, dysuria, flank pain and hematuria.  Musculoskeletal: Negative for arthralgias, back pain, gait problem, joint swelling and myalgias.  Skin: Positive for color change and wound. Negative for pallor and rash.  Neurological: Negative for dizziness, tremors, weakness and light-headedness.  Hematological: Negative for adenopathy. Does not bruise/bleed easily.  Psychiatric/Behavioral: Negative for agitation, behavioral problems, confusion, decreased concentration, dysphoric mood and sleep disturbance.       Objective:   Physical Exam Constitutional:      General: He is not in acute distress.    Appearance: Normal appearance. He is well-developed and well-nourished. He is not ill-appearing or diaphoretic.  HENT:     Head: Normocephalic and atraumatic.     Right Ear: Hearing and external ear normal.     Left Ear: Hearing and  external ear normal.     Nose: No nasal deformity, rhinorrhea or epistaxis.  Eyes:     General: No scleral icterus.    Extraocular Movements: EOM normal.     Conjunctiva/sclera: Conjunctivae normal.     Right eye: Right conjunctiva is not injected.     Left eye: Left conjunctiva is not injected.     Pupils: Pupils are equal, round, and reactive to light.  Neck:     Vascular: No JVD.  Cardiovascular:     Rate and Rhythm: Normal rate and regular rhythm.     Heart sounds: S1 normal and S2 normal.  Pulmonary:     Effort: Pulmonary effort is normal. No respiratory distress.  Abdominal:     General: There is no distension or ascites.     Palpations: Abdomen is soft. There is no hepatosplenomegaly.     Tenderness: There is no abdominal tenderness.  Musculoskeletal:        General: Normal range of motion.     Right shoulder: Normal.     Left shoulder: Normal.     Cervical back: Normal range of motion and neck supple.     Right hip: Normal.     Left hip: Normal.     Right knee: Normal.     Left knee: Normal.  Lymphadenopathy:     Head:     Right side of head: No submandibular, preauricular or posterior auricular adenopathy.     Left side of head: No submandibular, preauricular or posterior auricular adenopathy.     Cervical: No cervical adenopathy.     Right cervical: No superficial or deep cervical adenopathy.    Left cervical: No superficial or deep cervical adenopathy.  Skin:    General: Skin is warm, dry and intact.     Coloration: Skin is not pale.     Findings: No abrasion, bruising, ecchymosis, erythema, lesion or rash.     Nails: There is no clubbing or cyanosis.  Neurological:     General: No focal deficit present.     Mental Status: He is alert and oriented to person, place, and time.     Sensory: No  sensory deficit.     Coordination: Coordination normal.     Gait: Gait normal.     Deep Tendon Reflexes: Strength normal.  Psychiatric:        Attention and Perception:  He is attentive.        Mood and Affect: Mood and affect and mood normal.        Speech: Speech normal.        Behavior: Behavior normal. Behavior is cooperative.        Thought Content: Thought content normal.        Cognition and Memory: Cognition and memory normal.        Judgment: Judgment normal.     Left foot 05/08/2020:          Assessment & Plan:  Osteomyelitis of left foot: in terms of cure he needs an amputation. Issues are multiple.   First of all he is currently taking care of his wife who is bedbound and while he has 3 sons in [place] that are helping him from time to time he is still the primary caregiver so even surgery on the foot alone is going to be difficult for him to be able to arrange.  The other big issue is whether or not he would heal surgery in the foot which would resect his infected bone and whether or not he might need a more proximal amputation i.e. a below the knee amputation to get cure.  I do not think IV antibiotics are going to give [place] any count of long-term solution here.  My approach instead would be to try to either give him periodic antibiotics such as linezolid or long-acting therapy such as oritavancin or dalbavancin or try to treat him with Bactrim though given the normal doses that we try to use with this drug against MRSA and his chronic kidney disease I do not know if this is going to be an easy medication to use either.  Tedezolid would be something to consider but with him being Medicare patient would see unlikely for someone to cover this and impossible to get pharma assistance  I am labs to get a including BMP CBC with differential sed rate and CRP and will discuss options with our infectious disease pharmacist  I am scheduling him to come see me in 2 weeks.  Cough: Some initial concerns that he could have COVID-19 infection but his cough has been going on for 5 weeks and not worsened is in fact improving and he has no other  symptoms besides this. If this was COVID he should not be infectious at this point.   I spent greater than 60  minutes with the patient including greater than 50% of time in face to face counsel of the patient guarding the nature of bone infections and how we manage them typically with combination of surgical intervention with resection of bone and nature of infection in the context of vascular disease diabetes and nature of organism such as Staph aureus which certainly is a likely culprit for his osteomyelitis and in coordination of his care.

## 2020-05-09 LAB — CBC WITH DIFFERENTIAL/PLATELET
Absolute Monocytes: 595 cells/uL (ref 200–950)
Basophils Absolute: 7 cells/uL (ref 0–200)
Basophils Relative: 0.1 %
Eosinophils Absolute: 140 cells/uL (ref 15–500)
Eosinophils Relative: 2 %
HCT: 33.2 % — ABNORMAL LOW (ref 38.5–50.0)
Hemoglobin: 10.8 g/dL — ABNORMAL LOW (ref 13.2–17.1)
Lymphs Abs: 1715 cells/uL (ref 850–3900)
MCH: 29.2 pg (ref 27.0–33.0)
MCHC: 32.5 g/dL (ref 32.0–36.0)
MCV: 89.7 fL (ref 80.0–100.0)
MPV: 10.2 fL (ref 7.5–12.5)
Monocytes Relative: 8.5 %
Neutro Abs: 4543 cells/uL (ref 1500–7800)
Neutrophils Relative %: 64.9 %
Platelets: 197 10*3/uL (ref 140–400)
RBC: 3.7 10*6/uL — ABNORMAL LOW (ref 4.20–5.80)
RDW: 14.5 % (ref 11.0–15.0)
Total Lymphocyte: 24.5 %
WBC: 7 10*3/uL (ref 3.8–10.8)

## 2020-05-09 LAB — BASIC METABOLIC PANEL WITH GFR
BUN/Creatinine Ratio: 20 (calc) (ref 6–22)
BUN: 27 mg/dL — ABNORMAL HIGH (ref 7–25)
CO2: 27 mmol/L (ref 20–32)
Calcium: 9.2 mg/dL (ref 8.6–10.3)
Chloride: 104 mmol/L (ref 98–110)
Creat: 1.36 mg/dL — ABNORMAL HIGH (ref 0.70–1.18)
GFR, Est African American: 59 mL/min/{1.73_m2} — ABNORMAL LOW (ref >=60)
GFR, Est Non African American: 51 mL/min/{1.73_m2} — ABNORMAL LOW (ref >=60)
Glucose, Bld: 196 mg/dL — ABNORMAL HIGH (ref 65–99)
Potassium: 5 mmol/L (ref 3.5–5.3)
Sodium: 138 mmol/L (ref 135–146)

## 2020-05-09 LAB — C-REACTIVE PROTEIN: CRP: 2.8 mg/L (ref <8.0)

## 2020-05-09 LAB — SEDIMENTATION RATE: Sed Rate: 53 mm/h — ABNORMAL HIGH (ref 0–20)

## 2020-05-10 ENCOUNTER — Other Ambulatory Visit: Payer: Self-pay | Admitting: *Deleted

## 2020-05-10 ENCOUNTER — Telehealth: Payer: Self-pay

## 2020-05-10 DIAGNOSIS — E11621 Type 2 diabetes mellitus with foot ulcer: Secondary | ICD-10-CM

## 2020-05-10 DIAGNOSIS — I872 Venous insufficiency (chronic) (peripheral): Secondary | ICD-10-CM

## 2020-05-10 NOTE — Telephone Encounter (Signed)
Received call from [facility] at [facility] requesting call back regarding PA. Will route to [place].   Reference # [id number] Call back # [phone]  [name], RN

## 2020-05-11 ENCOUNTER — Other Ambulatory Visit: Payer: PPO

## 2020-05-11 ENCOUNTER — Telehealth: Payer: Self-pay

## 2020-05-11 DIAGNOSIS — Z20822 Contact with and (suspected) exposure to covid-19: Secondary | ICD-10-CM

## 2020-05-11 NOTE — Telephone Encounter (Signed)
[facility] Patient Advocate Encounter  Prior Authorization for Martin [name] has been approved.    PA# [id number] Effective dates: 05/11/20 through 04/28/21  Patients co-pay is $1847.38.   [facility] Clinic will continue to follow.  [name], [name] Patient [facility]ious [facility] Phone: [phone] Fax:  [phone]

## 2020-05-12 ENCOUNTER — Other Ambulatory Visit: Payer: PPO

## 2020-05-12 ENCOUNTER — Encounter (HOSPITAL_BASED_OUTPATIENT_CLINIC_OR_DEPARTMENT_OTHER): Payer: PPO | Admitting: Internal Medicine

## 2020-05-13 ENCOUNTER — Other Ambulatory Visit: Payer: Self-pay | Admitting: Family Medicine

## 2020-05-13 LAB — SARS-COV-2, NAA 2 DAY TAT

## 2020-05-13 LAB — NOVEL CORONAVIRUS, NAA: SARS-CoV-2, NAA: NOT DETECTED

## 2020-05-13 LAB — SPECIMEN STATUS REPORT

## 2020-05-15 NOTE — Progress Notes (Deleted)
Phone [phone] Virtual visit via Video note   Subjective:  Chief complaint: No chief complaint on file.   This visit type was conducted due to national recommendations for restrictions regarding the COVID-19 Pandemic (e.g. social distancing).  This format is felt to be most appropriate for this patient at this time balancing risks to patient and risks to population by having him in for in person visit.  No physical exam was performed (except for noted visual exam or audio findings with Telehealth visits).    Our team/I connected with [place]. at 11:00 AM EST by a video enabled telemedicine application (doxy.me or caregility through epic) and verified that I am speaking with the correct person using two identifiers.  Location patient: [facility] Location provider: [facility], office Persons participating in the virtual visit:  patient  Our team/I discussed the limitations of evaluation and management by telemedicine and the availability of in person appointments. In light of current covid-19 pandemic, patient also understands that we are trying to protect them by minimizing in office contact if at all possible.  The patient expressed consent for telemedicine visit and agreed to proceed. Patient understands insurance will be billed.   Past Medical History-  Patient Active Problem List   Diagnosis Date Noted  . Osteomyelitis of fifth toe of left foot ([place]) 05/08/2020  . Simple chronic bronchitis ([place]) 05/08/2020  . 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 Postoperative atrial fibrillation ([place]) 10/24/2018  . Acute blood loss as cause of postoperative anemia 10/24/2018  . BPH (benign prostatic hyperplasia) 10/24/2018  . History of depression 10/24/2018  . 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 Onychomycosis of toenail 02/17/2018  . Allergic rhinitis 08/20/2017  . PAD (peripheral artery disease) ([place]) 08/20/2017  . Partial tear  of right Achilles tendon 02/12/2017  . Aortic atherosclerosis ([place]) 10/07/2016  . C. difficile colitis 09/25/2016  . History of aspiration pneumonia 09/25/2016  . History of skin cancer 08/15/2015  . Undiagnosed cardiac murmurs 08/15/2015  . Diabetic retinopathy ([place]) 02/21/2015  . Former smoker 08/24/2014  . Hyperlipidemia associated with type 2 diabetes mellitus ([place]) 08/24/2014  . Ingrowing toenail 07/19/2014  . Diabetic polyneuropathy ([place]) 04/11/2009  . BACK PAIN, LUMBAR, WITH RADICULOPATHY 08/30/2008  . DM (diabetes mellitus) type II uncontrolled with eye manifestation ([place]) 02/03/2007  . Hypertension associated with diabetes ([place]) 02/03/2007    Medications- reviewed and updated Current Outpatient Medications  Medication Sig Dispense Refill  . aspirin 81 MG EC tablet Take 1 tablet (81 mg total) by mouth daily.    Marland Kitchen ezetimibe (ZETIA) 10 MG tablet TAKE 1 TABLET BY MOUTH EVERY DAY 90 tablet 0  . fexofenadine (ALLEGRA) 180 MG tablet Take 180 mg by mouth daily.    . fluticasone (FLONASE) 50 MCG/ACT nasal spray SPRAY 2 SPRAYS INTO EACH NOSTRIL EVERY DAY 48 mL 1  . insulin aspart (NOVOLOG FLEXPEN) 100 UNIT/ML FlexPen 3 times a day (just before each meal) 25-20-25 units, and pen needles 4/day 25 pen 3  . insulin glargine (LANTUS SOLOSTAR) 100 UNIT/ML Solostar Pen Inject 10 Units into the skin at bedtime. 5 pen PRN  . Insulin Pen Needle (NOVOFINE) 32G X 6 MM MISC USE 5 TIMES DAILY 450 each 2  . linezolid (ZYVOX) 600 MG tablet Take 600 mg by mouth 2 (two) times daily.    Marland Kitchen LIVALO 1 MG TABS TAKE 1 TABLET BY MOUTH EVERY DAY 90 tablet 3  .  losartan (COZAAR) 25 MG tablet TAKE 1/2 TABLET BY MOUTH EVERY DAY 45 tablet 1  . metoprolol tartrate (LOPRESSOR) 50 MG tablet TAKE 1 TABLET BY MOUTH TWICE A DAY 180 tablet 1  . ONETOUCH VERIO test strip CHECK LEVELS 4 TIMES DAILY 150 strip 1  . torsemide (DEMADEX) 20 MG tablet TAKE 2 TABLETS BY MOUTH EVERY DAY 180 tablet 0  . vitamin C (ASCORBIC ACID) 250  MG tablet Take 250 mg by mouth daily.     No current facility-administered medications for this visit.     Objective:  There were no vitals taken for this visit. self reported vitals Gen: NAD, resting comfortably Lungs: nonlabored, normal respiratory rate *** Skin: appears dry, no obvious rash     Assessment and Plan  ***wants to consider onychomycosis  ***not godo fit with Dr. Prudence Davidson Congestion S:***  A/P: ***   Recommended follow up: *** Future Appointments  Date Time Provider [place]  05/16/2020 11:00 AM Marin Olp, MD [facility]  05/19/2020  7:30 AM Ricard Dillon, MD [facility] [employer]  05/24/2020  9:00 AM [facility]  05/24/2020  3:15 PM Truman Hayward, MD [facility]  08/16/2020  9:20 AM Yong Channel [name], MD [facility]    Lab/Order associations: No diagnosis found.  No orders of the defined types were placed in this encounter.   Time Spent: *** minutes of total time (1:18 PM***- 1:18 PM***) was spent on the date of the encounter performing the following actions: chart review prior to seeing the patient, obtaining history, performing a medically necessary exam, counseling on the treatment plan, placing orders, and documenting in our EHR.   Return precautions advised.  Clyde Lundborg, CMA

## 2020-05-15 NOTE — Patient Instructions (Incomplete)
Health Maintenance Due  Topic Date Due  . Fecal DNA (Cologuard)  Never done  . COVID-19 Vaccine (3 - Booster for Moderna series) 12/25/2019   Depression screen [facility] 2/9 12/20/2019 01/06/2019 07/01/2018  Decreased Interest 0 0 0  Down, Depressed, Hopeless 0 0 0  PHQ - 2 Score 0 0 0  Altered sleeping 0 0 -  Tired, decreased energy 0 0 -  Change in appetite 0 0 -  Feeling bad or failure about yourself  0 0 -  Trouble concentrating 0 0 -  Moving slowly or fidgety/restless 0 0 -  Suicidal thoughts 0 0 -  PHQ-9 Score 0 0 -  Difficult doing work/chores Not difficult at all Not difficult at all -  Some recent data might be hidden

## 2020-05-16 ENCOUNTER — Telehealth (INDEPENDENT_AMBULATORY_CARE_PROVIDER_SITE_OTHER): Payer: PPO | Admitting: Family Medicine

## 2020-05-16 ENCOUNTER — Telehealth: Payer: PPO | Admitting: Family Medicine

## 2020-05-16 ENCOUNTER — Other Ambulatory Visit: Payer: Self-pay

## 2020-05-16 ENCOUNTER — Encounter: Payer: Self-pay | Admitting: Family Medicine

## 2020-05-16 VITALS — BP 150/75 | HR 63 | Temp 98.0°F

## 2020-05-16 DIAGNOSIS — I152 Hypertension secondary to endocrine disorders: Secondary | ICD-10-CM | POA: Diagnosis not present

## 2020-05-16 DIAGNOSIS — E1159 Type 2 diabetes mellitus with other circulatory complications: Secondary | ICD-10-CM

## 2020-05-16 DIAGNOSIS — J329 Chronic sinusitis, unspecified: Secondary | ICD-10-CM | POA: Diagnosis not present

## 2020-05-16 DIAGNOSIS — B9689 Other specified bacterial agents as the cause of diseases classified elsewhere: Secondary | ICD-10-CM

## 2020-05-16 MED ORDER — AMOXICILLIN-POT CLAVULANATE 875-125 MG PO TABS: 1.0000 | ORAL_TABLET | Freq: Two times a day (BID) | ORAL | 0 refills | Status: AC

## 2020-05-16 MED ORDER — LOSARTAN POTASSIUM 25 MG PO TABS: 25.0000 mg | ORAL_TABLET | Freq: Every day | ORAL | 1 refills | Status: DC

## 2020-05-16 NOTE — Progress Notes (Signed)
Phone [phone] Virtual visit via phonenote   Subjective:   Chief Complaint  Patient presents with  . sinus congestion    Negative COVID test 1/13, started experiencing fullness in ears and sinus' late December, worsening over the past several weeks. Denies any OTC helping symptoms    This visit type was conducted due to national recommendations for restrictions regarding the COVID-19 Pandemic (e.g. social distancing).  This format is felt to be most appropriate for this patient at this time balancing risks to patient and risks to population by having him in for in person visit.  All issues noted in this document were discussed and addressed.  No physical exam was performed (except for noted visual exam or audio findings with Telehealth visits).  The patient has consented to conduct a Telehealth visit and understands insurance will be billed.   Our team/I connected with Martin Agee Sr. at 12:40 PM EST by phone (patient did not have equipment for webex) and verified that I am speaking with the correct person using two identifiers.  Location patient: Home[facility] Location provider: [facility], office Persons participating in the virtual visit:  patient  Time on phone: 22 minutes Counseling provided about covid 19 vaccination, how to handle covid if it occurred including questions about ivermectin, bp goals and changing bp meds  Our team/I discussed the limitations of evaluation and management by telemedicine and the availability of in person appointments. In light of current covid-19 pandemic, patient also understands that we are trying to protect them by minimizing in office contact if at all possible.  The patient expressed consent for telemedicine visit and agreed to proceed. Patient understands insurance will be billed.   Past Medical History-  Patient Active Problem List   Diagnosis Date Noted  . Postoperative atrial fibrillation ([place]) 10/24/2018    Priority: High  . 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 Undiagnosed cardiac murmurs 08/15/2015    Priority: High  . DM (diabetes mellitus) type II uncontrolled with eye manifestation ([place]) 02/03/2007    Priority: High  . 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 PAD (peripheral artery disease) ([place]) 08/20/2017    Priority: Medium  . Partial tear of right Achilles tendon 02/12/2017    Priority: Medium  . Aortic atherosclerosis ([place]) 10/07/2016    Priority: Medium  . History of skin cancer 08/15/2015    Priority: Medium  . Diabetic retinopathy ([place]) 02/21/2015    Priority: Medium  . Hyperlipidemia associated with type 2 diabetes mellitus ([place]) 08/24/2014    Priority: Medium  . Diabetic polyneuropathy ([place]) 04/11/2009    Priority: Medium  . Hypertension associated with diabetes ([place]) 02/03/2007    Priority: Medium  . Acute blood loss as cause of postoperative anemia 10/24/2018    Priority: Low  . BPH (benign prostatic hyperplasia) 10/24/2018    Priority: Low  . History of depression 10/24/2018    Priority: Low  . S/P CABG x 4 10/01/2018    Priority: Low  . Onychomycosis of toenail 02/17/2018    Priority: Low  . Allergic rhinitis 08/20/2017    Priority: Low  . C. difficile colitis 09/25/2016    Priority: Low  . History of aspiration pneumonia 09/25/2016    Priority: Low  . Former smoker 08/24/2014    Priority: Low  . Ingrowing toenail 07/19/2014    Priority: Low  . BACK PAIN, LUMBAR, WITH RADICULOPATHY 08/30/2008  Priority: Low  . Osteomyelitis of fifth toe of left foot ([place]) 05/08/2020  . Simple chronic bronchitis ([place]) 05/08/2020    Medications- reviewed and updated Current Outpatient Medications  Medication Sig Dispense Refill  . aspirin 81 MG EC tablet Take 1 tablet (81 mg total) by mouth daily.    Marland Kitchen ezetimibe (ZETIA) 10 MG tablet TAKE 1 TABLET BY MOUTH EVERY DAY 90 tablet 0  . insulin aspart (NOVOLOG FLEXPEN) 100 UNIT/ML FlexPen  3 times a day (just before each meal) 25-20-25 units, and pen needles 4/day 25 pen 3  . insulin glargine (LANTUS SOLOSTAR) 100 UNIT/ML Solostar Pen Inject 10 Units into the skin at bedtime. 5 pen PRN  . Insulin Pen Needle (NOVOFINE) 32G X 6 MM MISC USE 5 TIMES DAILY 450 each 2  . linezolid (ZYVOX) 600 MG tablet Take 600 mg by mouth 2 (two) times daily.    Marland Kitchen LIVALO 1 MG TABS TAKE 1 TABLET BY MOUTH EVERY DAY 90 tablet 3  . losartan (COZAAR) 25 MG tablet TAKE 1/2 TABLET BY MOUTH EVERY DAY 45 tablet 1  . metoprolol tartrate (LOPRESSOR) 50 MG tablet TAKE 1 TABLET BY MOUTH TWICE A DAY 180 tablet 1  . ONETOUCH VERIO test strip CHECK LEVELS 4 TIMES DAILY 150 strip 1  . fexofenadine (ALLEGRA) 180 MG tablet Take 180 mg by mouth daily. (Patient not taking: Reported on 05/16/2020)    . fluticasone (FLONASE) 50 MCG/ACT nasal spray SPRAY 2 SPRAYS INTO EACH NOSTRIL EVERY DAY (Patient not taking: Reported on 05/16/2020) 48 mL 1  . torsemide (DEMADEX) 20 MG tablet TAKE 2 TABLETS BY MOUTH EVERY DAY (Patient not taking: Reported on 05/16/2020) 180 tablet 0  . vitamin C (ASCORBIC ACID) 250 MG tablet Take 250 mg by mouth daily. (Patient not taking: Reported on 05/16/2020)     No current facility-administered medications for this visit.     Objective:  BP (!) 150/75   Pulse 63   Temp 98 F (36.7 C) (Oral)   SpO2 98%  self reported vitals  Nonlabored voice, normal speech      Assessment and Plan    # Sinus congestion S:patient with fullness in ears/sinuses starting late December at least 3 weeks. Tested for covid 05/11/20 and negative. OTC meds tried: claritin for a few days, mucinex 7-8 days mild help. He has runny nose, nasal drip, coughing up phlegm- still with sinus congestion and ear fullness- clear to white.  A/P: potential bacterial sinusitis - with 3 weeks of sinus congestion with lack of improvement. Will trial augmentin x 7 days. 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 Also dealing with foot ulcer and workign with ID BP Readings from Last 3 Encounters:  05/16/20 (!) 150/75  05/08/20 (!) 186/85  02/09/20 130/70  A/P: patient under a lot of stress lately and BP running higher. We opted to increase to losartan 25mg  from 12.5mg  and asked patient to update me in 2 weeks with how #s are doing- may need further increase. He knows can go back down to lower dose if stressors reduce and BP runs lower such as <875 systolic. Continue all other medications  Recommended follow up: keep April visit but update [place] by [facility] sooner with BPs Future Appointments  Date Time Provider [place]  05/19/2020  7:30 AM Ricard Dillon,  MD [facility]  05/24/2020  9:00 AM [facility]  05/24/2020  3:15 PM Truman Hayward, MD [facility]  08/16/2020  9:20 AM Yong Channel [name], MD [facility]    Lab/Order associations:   ICD-10-CM   1. Bacterial sinusitis  J32.9    B96.89   2. Hypertension associated with diabetes ([place])  E11.59    I15.2    Meds ordered this encounter  Medications  . amoxicillin-clavulanate (AUGMENTIN) 875-125 MG tablet    Sig: Take 1 tablet by mouth 2 (two) times daily for 7 days.    Dispense:  14 tablet    Refill:  0  . losartan (COZAAR) 25 MG tablet    Sig: Take 1 tablet (25 mg total) by mouth daily.    Dispense:  90 tablet    Refill:  1   Return precautions advised.  Martin Reddish, MD

## 2020-05-16 NOTE — Patient Instructions (Signed)
Health Maintenance Due  Topic Date Due  . Fecal DNA (Cologuard)  Never done      Recommended follow up: No follow-ups on file.

## 2020-05-19 ENCOUNTER — Encounter (HOSPITAL_BASED_OUTPATIENT_CLINIC_OR_DEPARTMENT_OTHER): Payer: PPO | Admitting: Internal Medicine

## 2020-05-23 DIAGNOSIS — L97521 Non-pressure chronic ulcer of other part of left foot limited to breakdown of skin: Secondary | ICD-10-CM | POA: Diagnosis not present

## 2020-05-24 ENCOUNTER — Other Ambulatory Visit: Payer: PPO

## 2020-05-24 ENCOUNTER — Ambulatory Visit: Payer: PPO | Admitting: Infectious Disease

## 2020-05-26 ENCOUNTER — Encounter (HOSPITAL_BASED_OUTPATIENT_CLINIC_OR_DEPARTMENT_OTHER): Payer: PPO | Admitting: Internal Medicine

## 2020-05-26 ENCOUNTER — Other Ambulatory Visit: Payer: Self-pay

## 2020-05-26 DIAGNOSIS — E11621 Type 2 diabetes mellitus with foot ulcer: Secondary | ICD-10-CM | POA: Diagnosis not present

## 2020-05-26 DIAGNOSIS — L97522 Non-pressure chronic ulcer of other part of left foot with fat layer exposed: Secondary | ICD-10-CM | POA: Diagnosis not present

## 2020-05-26 NOTE — Progress Notes (Signed)
CASTOR, PRESS ([id number]) Visit Report for 05/26/2020 Arrival Information Details Patient Name: Date of Service: Martin [name], Martin [name] 05/26/2020 8:00 A M Medical Record Number: [id number] Patient Account Number: [id number] Date of Birth/Sex: Treating RN: Mar 29, 1946 (75 y.o. Martin [name], [name] Primary Care Albertina Leise: [name] Other Clinician: Referring Teyton Pattillo: Treating Yailyn Strack/Extender: [name] in Treatment: 74 Visit Information History Since Last Visit Added or deleted any medications: No Patient Arrived: [name] Any new allergies or adverse reactions: No Arrival Time: 08:02 Had a fall or experienced change in No Accompanied By: self activities of daily living that may affect Transfer Assistance: None risk of falls: Patient Identification Verified: Yes Signs or symptoms of abuse/neglect since last visito No Secondary Verification Process Completed: Yes Hospitalized since last visit: No Patient Requires Transmission-Based Precautions: No Implantable device outside of the clinic excluding No Patient Has Alerts: No cellular tissue based products placed in the center since last visit: Has Dressing in Place as Prescribed: Yes Pain Present Now: No Electronic Signature(s) Signed: 05/26/2020 10:25:52 AM By: Sandre Kitty Entered By: Sandre Kitty on 05/26/2020 08:02:17 -------------------------------------------------------------------------------- Encounter Discharge Information Details Patient Name: Date of Service: Martin [name]. 05/26/2020 8:00 A M Medical Record Number: [id number] Patient Account Number: [id number] Date of Birth/Sex: Treating RN: Apr 03, 1946 (75 y.o. Martin [name] Primary Care Norvil Martensen: [name] Other Clinician: Referring Robby Bulkley: Treating Matilde Markie/Extender: [name] in Treatment: 22 Encounter Discharge Information Items Post Procedure Vitals Discharge Condition:  Stable Temperature (F): 98.3 Ambulatory Status: Cane Pulse (bpm): 76 Discharge Destination: Home Respiratory Rate (breaths/min): 18 Transportation: Private Auto Blood Pressure (mmHg): 166/79 Accompanied By: self Schedule Follow-up Appointment: Yes Clinical Summary of Care: Electronic Signature(s) Signed: 05/26/2020 5:42:38 PM By: Deon Pilling Entered By: Deon Pilling on 05/26/2020 09:34:07 -------------------------------------------------------------------------------- Lower Extremity Assessment Details Patient Name: Date of Service: Martin, [name] 05/26/2020 8:00 A M Medical Record Number: [id number] Patient Account Number: [id number] Date of Birth/Sex: Treating RN: 1946/01/10 (75 y.o. Martin [name] Primary Care Araya Roel: [name] Other Clinician: Referring Delisia Mcquiston: Treating Nadeen Shipman/Extender: [name] in Treatment: 22 Edema Assessment Assessed: [name]: Yes] [name]: No] Edema: [Left: Ye] [Right: s] Calf Left: Right: Point of Measurement: 32 cm From Medial Instep 43 cm Ankle Left: Right: Point of Measurement: 11 cm From Medial Instep 29 cm Vascular Assessment Pulses: Dorsalis Pedis Palpable: [Left:Yes] Electronic Signature(s) Signed: 05/26/2020 5:42:38 PM By: Deon Pilling Entered By: Deon Pilling on 05/26/2020 08:12:10 -------------------------------------------------------------------------------- Multi Wound Chart Details Patient Name: Date of Service: Martin [name]. 05/26/2020 8:00 A M Medical Record Number: [id number] Patient Account Number: [id number] Date of Birth/Sex: Treating RN: 02-Jun-1945 (75 y.o. [name] Primary Care Lasha Echeverria: [name] Other Clinician: Referring Bobie Caris: Treating Chayanne Filippi/Extender: [name] in Treatment: 22 Vital Signs Height(in): 72 Pulse(bpm): 76 Weight(lbs): 270 Blood Pressure(mmHg): 166/79 Body Mass Index(BMI): 37 Temperature(F):  98.3 Respiratory Rate(breaths/min): 18 Photos: [5:No Photos Left Metatarsal head fifth] [N/A:N/A N/A] Wound Location: [5:Gradually Appeared] [N/A:N/A] Wounding Event: [5:Diabetic Wound/Ulcer of the Lower] [N/A:N/A] Primary Etiology: [5:Extremity Cataracts, Coronary Artery Disease,] [N/A:N/A] Comorbid History: [5:Hypertension, Type II Diabetes, Neuropathy 10/31/2019] [N/A:N/A] Date Acquired: [5:22] [N/A:N/A] Weeks of Treatment: [5:Open] [N/A:N/A] Wound Status: [5:1x1.3x1.4] [N/A:N/A] Measurements L x W x D (cm) [5:1.021] [N/A:N/A] A (cm) : rea [5:1.429] [N/A:N/A] Volume (cm) : [5:15.60%] [N/A:N/A] % Reduction in A rea: [5:-68.70%] [N/A:N/A] % Reduction in Volume: [5:Grade 3] [N/A:N/A] Classification: [5:Medium] [N/A:N/A] Exudate A mount: [5:Purulent] [N/A:N/A] Exudate Type: [5:yellow, brown, green] [N/A:N/A] Exudate Color: [5:Well defined,  not attached] [N/A:N/A] Wound Margin: [5:Medium (34-66%)] [N/A:N/A] Granulation A mount: [5:Red, Pink, Hyper-granulation] [N/A:N/A] Granulation Quality: [5:Medium (34-66%)] [N/A:N/A] Necrotic A mount: [5:Fat Layer (Subcutaneous Tissue): Yes N/A] Exposed Structures: [5:Bone: Yes Fascia: No Tendon: No Muscle: No Joint: No None] [N/A:N/A] Epithelialization: [5:Debridement - Excisional] [N/A:N/A] Debridement: Pre-procedure Verification/Time Out 08:30 [N/A:N/A] Taken: [5:Lidocaine 5% topical ointment] [N/A:N/A] Pain Control: [5:Other, Subcutaneous, Slough] [N/A:N/A] Tissue Debrided: [5:Skin/Subcutaneous Tissue] [N/A:N/A] Level: [5:1.3] [N/A:N/A] Debridement A (sq cm): [5:rea Blade] [N/A:N/A] Instrument: [5:Minimum] [N/A:N/A] Bleeding: [5:Silver Nitrate] [N/A:N/A] Hemostasis A chieved: [5:0] [N/A:N/A] Procedural Pain: [5:0] [N/A:N/A] Post Procedural Pain: [5:Procedure was tolerated well] [N/A:N/A] Debridement Treatment Response: [5:1x1.3x1.4] [N/A:N/A] Post Debridement Measurements L x W x D (cm) [5:1.429] [N/A:N/A] Post Debridement Volume:  (cm) [5:Debridement] [N/A:N/A] Treatment Notes Electronic Signature(s) Signed: 05/26/2020 5:33:44 PM By: Linton Ham MD Signed: 05/26/2020 5:52:16 PM By: Baruch Gouty RN, BSN Entered By: Linton Ham on 05/26/2020 08:52:21 -------------------------------------------------------------------------------- Multi-Disciplinary Care Plan Details Patient Name: Date of Service: Martin [name]. 05/26/2020 8:00 A M Medical Record Number: [id number] Patient Account Number: [id number] Date of Birth/Sex: Treating RN: [birth year] (75 y.o. [name] Primary Care Markanthony Gedney: [name] Other Clinician: Referring Yulonda Wheeling: Treating Claudius Mich/Extender: [name] in Treatment: 22 Active Inactive Nutrition Nursing Diagnoses: Impaired glucose control: actual or potential Potential for alteratiion in Nutrition/Potential for imbalanced nutrition Goals: Patient/caregiver will maintain therapeutic glucose control Date Initiated: 12/22/2019 Target Resolution Date: 06/23/2020 Goal Status: Active Interventions: Assess HgA1c results as ordered upon admission and as needed Treatment Activities: Patient referred to Primary Care Physician for further nutritional evaluation : 12/22/2019 Notes: Osteomyelitis Nursing Diagnoses: Infection: osteomyelitis Knowledge deficit related to disease process and management Goals: Patient's osteomyelitis will resolve Date Initiated: 05/05/2020 Target Resolution Date: 07/21/2020 Goal Status: Active Interventions: Assess for signs and symptoms of osteomyelitis resolution every visit Provide education on osteomyelitis Screen for HBO Treatment Activities: Consult for HBO : 05/05/2020 MRI : 04/17/2020 Systemic antibiotics : 05/05/2020 Notes: Soft Tissue Infection Nursing Diagnoses: Impaired tissue integrity Potential for infection: soft tissue Goals: Patient's soft tissue infection will resolve Date Initiated:  03/29/2020 Target Resolution Date: 06/23/2020 Goal Status: Active Interventions: Assess signs and symptoms of infection every visit Provide education on infection Treatment Activities: Culture and sensitivity : 03/29/2020 Education provided on Infection : 05/05/2020 Systemic antibiotics : 03/29/2020 T ordered outside of clinic : 03/29/2020 est Notes: Wound/Skin Impairment Nursing Diagnoses: Impaired tissue integrity Knowledge deficit related to ulceration/compromised skin integrity Goals: Patient/caregiver will verbalize understanding of skin care regimen Date Initiated: 12/22/2019 Target Resolution Date: 06/23/2020 Goal Status: Active Ulcer/skin breakdown will have a volume reduction of 30% by week 4 Date Initiated: 12/22/2019 Date Inactivated: 01/19/2020 Target Resolution Date: 01/19/2020 Goal Status: Unmet Unmet Reason: infection Ulcer/skin breakdown will have a volume reduction of 50% by week 8 Date Initiated: 01/19/2020 Date Inactivated: 02/16/2020 Target Resolution Date: 02/16/2020 Goal Status: Unmet Unmet Reason: infection Ulcer/skin breakdown will have a volume reduction of 80% by week 12 Date Initiated: 02/16/2020 Date Inactivated: 03/15/2020 Target Resolution Date: 03/15/2020 Goal Status: Unmet Unmet Reason: osteo, getting HBO Interventions: Assess patient/caregiver ability to obtain necessary supplies Assess patient/caregiver ability to perform ulcer/skin care regimen upon admission and as needed Assess ulceration(s) every visit Provide education on ulcer and skin care Treatment Activities: Skin care regimen initiated : 12/22/2019 Topical wound management initiated : 12/22/2019 Notes: Electronic Signature(s) Signed: 05/26/2020 5:52:16 PM By: Baruch Gouty RN, BSN Entered By: Baruch Gouty on 05/26/2020 08:33:39 -------------------------------------------------------------------------------- Pain Assessment Details Patient Name: Date of Service: Martin [name].  05/26/2020 8:00 A M Medical Record Number: [id number] Patient Account Number: [id number] Date of Birth/Sex: Treating RN: [birth year] (75 y.o. [name] Primary Care Omarie Parcell: [name] Other Clinician: Referring Don Tiu: Treating Alwaleed Obeso/Extender: [name] in Treatment: 22 Active Problems Location of Pain Severity and Description of Pain Patient Has Paino No Site Locations Pain Management and Medication Current Pain Management: Electronic Signature(s) Signed: 05/26/2020 10:25:52 AM By: Sandre Kitty Signed: 05/26/2020 5:52:16 PM By: Baruch Gouty RN, BSN Entered By: Sandre Kitty on 05/26/2020 08:02:37 -------------------------------------------------------------------------------- Patient/Caregiver Education Details Patient Name: Date of Service: Compean, Manfred W. 1/28/2022andnbsp8:00 A M Medical Record Number: [id number] Patient Account Number: [id number] Date of Birth/Gender: Treating RN: 10-Dec-1945 (75 y.o. [name] Primary Care Physician: [name] Other Clinician: Referring Physician: Treating Physician/Extender: [name] in Treatment: 22 Education Assessment Education Provided To: Patient Education Topics Provided Infection: Methods: Explain/Verbal Responses: Reinforcements needed, State content correctly Offloading: Methods: Explain/Verbal Responses: Reinforcements needed, State content correctly Wound/Skin Impairment: Methods: Explain/Verbal Responses: Reinforcements needed, State content correctly Electronic Signature(s) Signed: 05/26/2020 5:52:16 PM By: Baruch Gouty RN, BSN Entered By: Baruch Gouty on 05/26/2020 08:35:07 -------------------------------------------------------------------------------- Wound Assessment Details Patient Name: Date of Service: Martin [name]. 05/26/2020 8:00 A M Medical Record Number: [id number] Patient Account Number:  [id number] Date of Birth/Sex: Treating RN: 09-11-1945 (75 y.o. [name], [name] Primary Care Tivon Lemoine: [name] Other Clinician: Referring Howie Rufus: Treating Jariel Drost/Extender: [name] in Treatment: 22 Wound Status Wound Number: 5 Primary Diabetic Wound/Ulcer of the Lower Extremity Etiology: Wound Location: Left Metatarsal head fifth Wound Open Wounding Event: Gradually Appeared Status: Date Acquired: 10/31/2019 Comorbid Cataracts, Coronary Artery Disease, Hypertension, Type II Weeks Of Treatment: 22 History: Diabetes, Neuropathy Clustered Wound: No Wound Measurements Length: (cm) 1 Width: (cm) 1.3 Depth: (cm) 1.4 Area: (cm) 1.021 Volume: (cm) 1.429 % Reduction in Area: 15.6% % Reduction in Volume: -68.7% Epithelialization: None Tunneling: No Undermining: No Wound Description Classification: Grade 3 Wound Margin: Well defined, not attached Exudate Amount: Medium Exudate Type: Purulent Exudate Color: yellow, brown, green Foul Odor After Cleansing: No Slough/Fibrino Yes Wound Bed Granulation Amount: Medium (34-66%) Exposed Structure Granulation Quality: Red, Pink, Hyper-granulation Fascia Exposed: No Necrotic Amount: Medium (34-66%) Fat Layer (Subcutaneous Tissue) Exposed: Yes Necrotic Quality: Adherent Slough Tendon Exposed: No Muscle Exposed: No Joint Exposed: No Bone Exposed: Yes Treatment Notes Wound #5 (Metatarsal head fifth) Wound Laterality: Left Cleanser Peri-Wound Care Zinc Oxide Ointment 30g tube Discharge Instruction: Apply Zinc Oxide to periwound with each dressing change as needed for maceration Sween Lotion (Moisturizing lotion) Discharge Instruction: Apply moisturizing lotion as directed Topical Primary Dressing KerraCel Ag Gelling Fiber Dressing, 2x2 in (silver alginate) Discharge Instruction: Lightly pack into undermining Secondary Dressing Woven Gauze Sponge, Non-Sterile 4x4 in Discharge Instruction:  Apply over primary dressing as directed. Optifoam Non-Adhesive Dressing, 4x4 in Discharge Instruction: Foam donut Secured With Elastic Bandage 4 inch (ACE bandage) Discharge Instruction: Secure with ACE bandage as directed. Kerlix Roll Sterile, 4.5x3.1 (in/yd) Discharge Instruction: Secure with Kerlix as directed. Compression Wrap Compression Stockings Add-Ons Electronic Signature(s) Signed: 05/26/2020 5:42:38 PM By: Deon Pilling Entered By: Deon Pilling on 05/26/2020 08:14:51 -------------------------------------------------------------------------------- Vitals Details Patient Name: Date of Service: Martin [name]. 05/26/2020 8:00 A M Medical Record Number: [id number] Patient Account Number: [id number] Date of Birth/Sex: Treating RN: May 16, 1945 (75 y.o. [name] Primary Care Haden Suder: [name] Other Clinician: Referring Loetta Connelley: Treating Niyla Marone/Extender: [name] in Treatment: 22 Vital Signs Time Taken: 08:02 Temperature (  F): 98.3 Height (in): 72 Pulse (bpm): 76 Weight (lbs): 270 Respiratory Rate (breaths/min): 18 Body Mass Index (BMI): 36.6 Blood Pressure (mmHg): 166/79 Reference Range: 80 - 120 mg / dl Electronic Signature(s) Signed: 05/26/2020 10:25:52 AM By: Sandre Kitty Entered By: Sandre Kitty on 05/26/2020 08:02:32

## 2020-05-26 NOTE — Progress Notes (Signed)
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 for this which I will take care of today. We have been using silver alginate on  the wound under a total contact cast. The wound is improving both in terms of appearance and surface area. He has some concerns about HBO which she is already approved for predominantly anxiety. He states he has had anxiety since open heart surgery a year ago 02/16/2020 on evaluation today patient appears to be doing better in regard to his foot ulcer in my opinion. Things are actually looking good in this regard. With that being said he does have some side effects from the linezolid. He tells me that he has been somewhat nauseated but mainly when he does not eat with the medication. Evenings are okay the mornings when he tends to eat less sometimes seem to be worse. With that being said this seems to be something that he can mitigate. With that being said he also has a rash however in the groin area that he tells me about today. He thinks that this may be due to the medication. Subsequently I think that it is due to the medication in a roundabout way and that the patient has what appears to be a yeast infection/tinea cruris which is likely indirectly secondary related to the fact that the patient has been on strong antibiotics for some time now due to the osteomyelitis. However I do not think it is a direct side effect of the medication itself per se. 02/23/2020 upon evaluation the patient appears to be doing  decently well in regard to his foot ulcer in fact I am very pleased with how things appear today. He does have less undermining and overall I think between the cast and now the start of the hyperbaric oxygen therapy he has a very good chance of getting this wound to heal. Fortunately there is no signs of active infection at this time which is great news. No fevers, chills, nausea, vomiting, or diarrhea. He does note that he has been having some issues with the linezolid causing him to be nauseated and he also states that it is caused him to lose his smell and taste. With that being said I am  really not certain that this is indeed the reason behind this but either way I think we can switch the antibiotic being that he is looking so well with minimal linezolid for close to 3-4 weeks at this point. We will do 2 weeks of Bactrim and then hopefully he should be complete with the antibiotic courses. 03/01/2020 on evaluation today patient is making good progress in regard to his wound. This is measuring smaller today and overall very pleased. The antibiotics which has seemed to help him he states that he is having a lot of improvement overall in his smell and taste as well as improvement in the overall nausea that he was experiencing with the linezolid. Obviously this is great news. 03/08/2020 upon evaluation today patient appears to be doing well with regard to his foot ulcer. I feel like this is showing signs of improvement it is measuring a little bit better today compared to previous. With that being said there is no evidence of active infection which is great news and in general I feel like the hyperbarics is helping him along with the antibiotics which she will be completing I believe in the next week. Outside of this also feel like that the total contact cast obviously has been of benefit. 03/15/2020 on evaluation today patient appears to be doing well in regard to his foot ulcer. He has been tolerating the dressing changes without complication. Fortunately there is no signs of active infection at this time. No fevers, chills, nausea, vomiting, or diarrhea. Patient is tolerating hyperbarics quite well at this time and I see no signs of infection currently which is great news. 03/22/2020 on evaluation today patient appears to be doing about the same in regard to his foot ulcer. He does have some tissue along the medial portion of the wound bed and I am unsure of exactly what this is just characteristically. It could be a small amount of tendon here to be honest. With that being said I  do think it needs to be removed as I feel like it is hindering his healing possibilities. 11/29; acute visit; I was asked to see the patient urgently today because of complaints of pain in his foot. He is a wound in the right fifth metatarsal head plantar aspect with underlying osteomyelitis. He has been undergoing hyperbaric oxygen treatment completing antibiotics in the middle of this month. He said the pain had increased to the point that it was becoming difficult for him to walk on his foot. He has not been systemically unwell.. He had a fairly significant debridement on his last visit 5 days ago. 03/29/2020 on evaluation today patient appears to be doing much better than he was on Monday according to the pictures and what I saw. Fortunately there is no signs of active infection at this time which is great  news. There is no evidence of systemic infection either. Overall I feel like the patient is doing indeed much better and is just a matter of having him continue the antibiotic for the time being in my opinion and then depending on the results of the culture we may extend that for a bit longer. 12/10; I am seeing the patient today because of difficulty had this week with his wife's illness. He could not come on Wednesday. The wound looks a lot better than what I usually am used to seeing. He is using silver alginate and a total contact cast continuing with HBO. He has completed his antibiotics cultures and x-rays were negative 04/12/2020 upon evaluation today patient unfortunately appears to be doing a little worse in regard to his wound from the standpoint of erythema surrounding. There does not appear to be any signs of active systemic infection which is great news. Nonetheless I am concerned about the fact that if we put him back in the cast he will not be able to visually see what was going on in regard to his foot and if anything is worsening he would have no idea into it could be  potentially too late. 12/17; surrounding his dive yesterday afternoon it was brought to my attention about the extent of swelling in the left leg. I really did not have enough time to fully evaluate this before the end of his treatment so I arranged to have a duplex ultrasound done rule out DVT which thankfully turned out to be negative. I note that he was felt to have cellulitis of the left foot surrounding his wound when he was seen on Wednesday by Jeri Cos and was put on trimethoprim sulfamethoxazole. His report showed no evidence of a DVT or SVT . I am seeing him today in follow-up of this. As it turns out his calf measurements have not really changed that much but he has much more swelling in the ankle. It could be because we did not put a total contact cast back on him. I think he has some degree of chronic venous insufficiency. 12/20; I wanted to look at the patient's leg when he was up on the table before he went into hyperbarics however he was put on the schedule today. He is still taking trimethoprim sulfamethoxazole. Culture that was done last Wednesday is negative. We put him in kerlix Coban wrap over the weekend still a lot of swelling in this leg. DVT rule out was negative there is no swelling in his thigh not sure I really have a good explanation for this. He is not complaining of systemic symptoms 04/19/2020 upon evaluation today patient appears to be doing somewhat poorly in regard to his foot ulcer. Again he has seen Dr. Dellia Nims Dr. Dellia Nims did order repeat MRI after I agree with this and think that is the right way to go at this point. Depending on the results of the MRI I discussed with the patient today that we may be looking at a referral potentially for surgery consultation if this occurs the patient would like to see someone for second opinion other than Dr. Sharol Given I understand and I think we can definitely do that I have never discourage patients from second opinions. With that  being said currently I do not see any evidence of worsening infection though there still appears to be significant erythema. The patient's culture did show that he is on the appropriate antibiotic with the Bactrim DS which I did prescribe  for him last week he has another week of this remaining. I think he can continue to take that for the time being. 12/28; patient comes back in the clinic today with a lot of drainage on the outside of his left dorsal foot. Very significant erythema on the lateral part of the foot and dorsal part of the foot which I have marked with a marker. Also have his MRI to review that showed findings suspicious for progressive osteomyelitis involving the fifth metatarsal head and proximal phalanx no drainable fluid collection. This is obviously worse since his last study in September. He has not been systemically unwell. Says he is not running a fever his blood sugars are stable. 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 For this reason he is completely opposed to any surgery presently. Furthermore his arterial status might not allow foot conserving surgery. I have reviewed his arterial studies from 9/1. This showed an ABI on the left of 0.90 however his TBI was only 0.44 but a toe pressure of 68. Monophasic waveforms. I wonder if he has tibial artery calcification and his ABIs are actually falsely elevated. 1/7; 1 week follow-up. Patient has completed the linezolid I gave him for MRSA cellulitis surrounding the wound in the left foot. This extended into his foot dorsally. This looks a lot better today and he has completed his antibiotics. He did not see infectious disease on Monday because her office was closed due to the weather however he has an appointment on Monday next. I have rediscussed his arterial status and I think the best thing to do would be to send him to an interventionalists. The patient has a good personal relationship with Dr.  Kathlene Cote of interventional radiology and will send him there for either repeat noninvasive studies or consideration of an angiogram. If we were to consider foot conserving surgery [ray amputation] he will need to have the vascular status to heal this. He is certainly not going to consider a below-knee amputation at this point. I am hoping to get him started on oIV antibiotics through infectious disease. 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 1/28; have not seen this patient in a few weeks. He did not see interventional radiology about his arterial status but is scheduled to do so early next week. He also has a follow-up with Dr. Drucilla Schmidt on 2/7 with regards to osteomyelitis. Lab work showed a creatinine of 1.36 and estimated GFR of 51 sedimentation rate of 53 and a CRP of 2.8. Dr. Drucilla Schmidt discussed the issues of amputation. I have told the patient today that certainly all of the surgical options currently may become necessary if the wound deteriorates or spreads. If his arterial evaluation is satisfactory then he might be able to get away with leg conserving surgery. Objective Constitutional Vitals Time Taken: 8:02 AM, Height: 72 in, Weight: 270 lbs, BMI: 36.6, Temperature: 98.3 F, Pulse: 76 bpm, Respiratory Rate: 18 breaths/min, Blood Pressure: 166/79 mmHg. Cardiovascular The patient has a palpable popliteal pulse. Sluggish dorsalis pedis pulse. Hitting edema in his left leg. General Notes: Wound exam; presents with a outpouching nodular area from the wound well above the surface of the surrounding skin. I think this is probably chronically hyper granulated tissue from underlying infection. Certainly not viable I removed this with a 15 scalpel. Hemostasis with silver nitrate, Surgifoam and direct pressure. Integumentary (Hair, Skin) Wound #5 status  is Open. Original cause of wound was Gradually Appeared. The wound is  located on the Left Metatarsal head fifth. The wound measures 1cm length x 1.3cm width x 1.4cm depth; 1.021cm^2 area and 1.429cm^3 volume. There is bone and Fat Layer (Subcutaneous Tissue) exposed. There is no tunneling or undermining noted. There is a medium amount of purulent drainage noted. The wound margin is well defined and not attached to the wound base. There is medium (34-66%) red, pink, hyper - granulation within the wound bed. There is a medium (34-66%) amount of necrotic tissue within the wound bed including Adherent Slough. 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 There was a Excisional Skin/Subcutaneous Tissue Debridement with a total area of 1.3 sq cm performed by Ricard Dillon., MD. With the following instrument(s): Blade to remove Viable and Non-Viable tissue/material. Material removed includes Subcutaneous Tissue, Slough, and Other: hypergranulation tissue after achieving pain control using Lidocaine 5% topical ointment. No specimens were taken. A time out was conducted at 08:30, prior to the start of the procedure. A Minimum amount of bleeding was controlled with Silver Nitrate. The procedure was tolerated well with a pain level of 0 throughout and a pain level of 0 following the procedure. Post Debridement Measurements: 1cm length x 1.3cm width x 1.4cm depth; 1.429cm^3 volume. Character of Wound/Ulcer Post Debridement requires further debridement. Severity of Tissue Post Debridement is: Fat layer exposed. Post  procedure Diagnosis Wound #5: Same as Pre-Procedure Plan Follow-up Appointments: Return Appointment in 1 week. Bathing/ Shower/ Hygiene: May shower with protection but do not get wound dressing(s) wet. Edema Control - Lymphedema / SCD / Other: Elevate legs to the level of the heart or above for 30 minutes daily and/or when sitting, a frequency of: Avoid standing for long periods of time. Patient to wear own compression stockings every day. Moisturize legs daily. Compression stocking or Garment 20-30 mm/Hg pressure to: - both legs daily Off-Loading: Wedge shoe to: - front offloading shoe with felt to offload lateral foot Additional Orders / Instructions: Other: - Go to emergency room for fever, chills, increased redness, increased pain in left foot WOUND #5: - Metatarsal head fifth Wound Laterality: Left Peri-Wound Care: Zinc Oxide Ointment 30g tube 1 x Per Day/30 Days Discharge Instructions: Apply Zinc Oxide to periwound with each dressing change as needed for maceration Peri-Wound Care: Sween Lotion (Moisturizing lotion) 1 x Per Day/30 Days Discharge Instructions: Apply moisturizing lotion as directed Prim Dressing: KerraCel Ag Gelling Fiber Dressing, 2x2 in (silver alginate) 1 x Per Day/30 Days ary Discharge Instructions: Lightly pack into undermining Secondary Dressing: Woven Gauze Sponge, Non-Sterile 4x4 in 1 x Per Day/30 Days Discharge Instructions: Apply over primary dressing as directed. Secondary Dressing: Optifoam Non-Adhesive Dressing, 4x4 in 1 x Per Day/30 Days Discharge Instructions: Foam donut Secured With: Elastic Bandage 4 inch (ACE bandage) 1 x Per Day/30 Days Discharge Instructions: Secure with ACE bandage as directed. Secured With: [employer], 4.5x3.1 (in/yd) 1 x Per Day/30 Days Discharge Instructions: Secure with Kerlix as directed. 1. The patient sees interventional radiology about his arterial status next week can  I think Dr. Lucianne Lei dam in follow-up. 2. I  am hopeful that he has sufficient blood flow to his foot to allow for the possibility of a ray amputation. 3. Because of the patient's wife medical status he is the caregiver I think he is reluctant to consider any surgery at this point 4. Until we get more information I have continued with silver alginate on the wound surface as the primary dressing. 5. Follow-up in 1 week Electronic Signature(s) Signed: 05/26/2020 5:33:44 PM By: Linton Ham MD Entered By: Linton Ham on 05/26/2020 09:01:47 -------------------------------------------------------------------------------- SuperBill Details Patient Name: Date of Service: MIKOLAJ, WOOLSTENHULME 05/26/2020 Medical Record Number: [id number] Patient Account Number: [id number] Date of Birth/Sex: Treating RN: 19-Nov-1945 (75 y.o. Martin [name] Primary Care Provider: [name] Other Clinician: Referring Provider: Treating Provider/Extender: [name] in Treatment: 22 Diagnosis Coding ICD-10 Codes Code Description E11.621 Type 2 diabetes mellitus with foot ulcer L97.522 Non-pressure chronic ulcer of other part of left foot with fat layer exposed M86.272 Subacute osteomyelitis, left ankle and foot L03.116 Cellulitis of left lower limb E11.51 Type 2 diabetes mellitus with diabetic peripheral angiopathy without gangrene Facility Procedures CPT4 Code: [id number] Description: [place] - DEB SUBQ TISSUE 20 SQ CM/< ICD-10 Diagnosis Description L97.522 Non-pressure chronic ulcer of other part of left foot with fat layer exposed Modifier: Quantity: 1 Physician Procedures : CPT4 Code Description Modifier [id number] - WC PHYS SUBQ TISS 20 SQ CM ICD-10 Diagnosis Description L97.522 Non-pressure chronic ulcer of other part of left foot with fat layer exposed Quantity: 1 Electronic Signature(s) Signed: 05/26/2020 5:33:44 PM By: Linton Ham MD Entered By: Linton Ham on 05/26/2020 09:02:20

## 2020-05-30 ENCOUNTER — Encounter: Payer: Self-pay | Admitting: *Deleted

## 2020-05-30 ENCOUNTER — Ambulatory Visit
Admission: RE | Admit: 2020-05-30 | Discharge: 2020-05-30 | Disposition: A | Discharge status: Home / Self Care | Payer: PPO | Source: Ambulatory Visit | Attending: *Deleted | Admitting: *Deleted

## 2020-05-30 DIAGNOSIS — I998 Other disorder of circulatory system: Secondary | ICD-10-CM

## 2020-05-30 DIAGNOSIS — S81802A Unspecified open wound, left lower leg, initial encounter: Secondary | ICD-10-CM

## 2020-05-30 DIAGNOSIS — I872 Venous insufficiency (chronic) (peripheral): Secondary | ICD-10-CM

## 2020-05-30 DIAGNOSIS — I739 Peripheral vascular disease, unspecified: Secondary | ICD-10-CM | POA: Diagnosis not present

## 2020-05-30 DIAGNOSIS — E11621 Type 2 diabetes mellitus with foot ulcer: Secondary | ICD-10-CM | POA: Diagnosis not present

## 2020-05-30 DIAGNOSIS — L97529 Non-pressure chronic ulcer of other part of left foot with unspecified severity: Secondary | ICD-10-CM | POA: Diagnosis not present

## 2020-05-30 HISTORY — PX: IR RADIOLOGIST EVAL & MGMT: IMG5224

## 2020-05-30 NOTE — Consult Note (Signed)
Chief Complaint: Patient was seen in consultation today for diabetic foot ulceration/osteomyelitis  Referring Physician(s): Hunter,Stephen O  Patient Status: [facility] - Out-pt  History of Present Illness: Martin LANGHANS Sr. is a 75 y.o. male presenting with a non-healing wound at the head of the left fifth metatarsal with associated osteomyelitis.  He had a prior wound at this site in 2018 which healed with conservative therapy.  A wound has returned at the same place this past June, which has been managed by Podiatry and [facility].  He has seen Vascular Surgery with recommendations for amputation, but comes to IR clinic with hopes of preserving his foot.  He is motivated to avoid surgery and stay out of the hospital as he is the primary care giver to his wife with advanced stage recurrent breast cancer.    He undergoes weekly debridement and dressing changes at the wound clinic.  He does not endorse significant pain, fevers, or chills.  Redness surrounding his forefoot has gradually worsened.    Most recent non-invasive imaging studies include ABIs and LLE arterial duplex on 12/29/19 which demonstrated right ABI of 1.08 (TBI 0.76) and left ABI 0.8 (TBI 0.44, toe pressure 68 mmHg).  Duplex demonstrated a moderate stenosis in the left popliteal artery and monophasic wave forms in the tibial vessels.  Pertinent past medical history is significant for coronary artery disease (s/p CABG 09/2018, recovering well), type II diabetes mellitus with A1c in the 7 range on insulin, remote brief smoking history of 4 years, and hyperlipidemia well controlled on lipid lowering medication.  He is on aspirin 81 mg daily, no anticoagulants.  He is a retired Chief of Staff, now retired and spends most of his time caring for his wife.  Past Medical History:  Diagnosis Date  . A-fib ([place])   . Diabetes mellitus   . Heart attack ([place]) 10/01/2018   Pt had open heart surgery  . Hypertension    . Osteomyelitis of fifth toe of left foot ([place]) 05/08/2020  . 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 Tobacco abuse     Past Surgical History:  Procedure Laterality Date  . CORONARY ARTERY BYPASS GRAFT N/A 10/01/2018   Procedure: CORONARY ARTERY BYPASS GRAFTING (CABG) TIMES  FOUR USING LEFT MAMMARY ARTERY AND RIGHT GREATER SAPHEANOUS VEIN HARVESTED ENDOSCOPICALLY;  Surgeon: Melrose Nakayama, MD;  Location: [facility];  Service: Open Heart Surgery;  Laterality: N/A;  . HERNIA REPAIR     >10 years ago  . IR RADIOLOGIST EVAL & MGMT  12/24/2016  . IR RADIOLOGIST EVAL & MGMT  05/30/2020  . IR THORACENTESIS ASP PLEURAL SPACE W/IMG GUIDE  10/06/2018  . LEFT HEART CATH AND CORONARY ANGIOGRAPHY N/A 09/29/2018   Procedure: LEFT HEART CATH AND CORONARY ANGIOGRAPHY;  Surgeon: Troy Sine, MD;  Location: [facility];  Service: Cardiovascular;  Laterality: N/A;  . open heart surfery    . right shoulder surgery     around 2014  . TEE WITHOUT CARDIOVERSION N/A 10/01/2018   Procedure: TRANSESOPHAGEAL ECHOCARDIOGRAM (TEE);  Surgeon: Melrose Nakayama, MD;  Location: [facility];  Service: Open Heart Surgery;  Laterality: N/A;    Allergies: Simvastatin and Statins  Medications: Prior to Admission medications   Medication Sig Start Date End Date Taking? Authorizing Provider  aspirin 81 MG EC tablet Take 1 tablet (81 mg total) by mouth daily. 02/04/19   Troy Sine, MD  ezetimibe (ZETIA) 10 MG tablet TAKE 1 TABLET BY MOUTH  EVERY DAY 05/04/20   Troy Sine, MD  fexofenadine (ALLEGRA) 180 MG tablet Take 180 mg by mouth daily. Patient not taking: Reported on 05/16/2020    [provider]  fluticasone (FLONASE) 50 MCG/ACT nasal spray SPRAY 2 SPRAYS INTO EACH NOSTRIL EVERY DAY Patient not taking: Reported on 05/16/2020 05/10/19   Marin Olp, MD  insulin aspart (NOVOLOG FLEXPEN) 100 UNIT/ML FlexPen 3 times a day (just before each meal) 25-20-25 units, and pen needles 4/day 07/13/19    Renato Shin, MD  insulin glargine (LANTUS SOLOSTAR) 100 UNIT/ML Solostar Pen Inject 10 Units into the skin at bedtime. 07/13/19   Renato Shin, MD  Insulin Pen Needle (NOVOFINE) 32G X 6 MM MISC USE 5 TIMES DAILY 07/05/19   Renato Shin, MD  linezolid (ZYVOX) 600 MG tablet Take 600 mg by mouth 2 (two) times daily. 01/19/20   [provider]  LIVALO 1 MG TABS TAKE 1 TABLET BY MOUTH EVERY DAY 05/15/20   Marin Olp, MD  losartan (COZAAR) 25 MG tablet Take 1 tablet (25 mg total) by mouth daily. 05/16/20   Marin Olp, MD  metoprolol tartrate (LOPRESSOR) 50 MG tablet TAKE 1 TABLET BY MOUTH TWICE A DAY 03/21/20   Troy Sine, MD  [facility] VERIO test strip CHECK LEVELS 4 TIMES DAILY 07/30/19   Renato Shin, MD  torsemide (DEMADEX) 20 MG tablet TAKE 2 TABLETS BY MOUTH EVERY DAY Patient not taking: Reported on 05/16/2020 05/04/20   Troy Sine, MD  vitamin C (ASCORBIC ACID) 250 MG tablet Take 250 mg by mouth daily. Patient not taking: Reported on 05/16/2020    [provider]     Family History  Problem Relation Age of Onset  . Hypertension Mother   . Heart disease Mother        CHF did not see doctor  . Diabetes Maternal Grandmother   . Diabetes Son     Social History   Socioeconomic History  . Marital status: Married    Spouse name: Not on file  . Number of children: Not on file  . Years of education: Not on file  . Highest education level: Not on file  Occupational History  . Not on file  Tobacco Use  . Smoking status: Former Smoker    Packs/day: 0.75    Years: 2.00    Pack years: 1.50    Types: Cigarettes    Start date: 2002    Quit date: 04/29/2002    Years since quitting: 18.0  . Smokeless tobacco: Never Used  . Tobacco comment: smoked for 10 years on and off  Substance and Sexual Activity  . Alcohol use: Yes    Alcohol/week: 0.0 standard drinks    Comment: 6 martinis a year  . Drug use: No  . Sexual activity: Yes  Other Topics Concern  .  Not on file  Social History Narrative   Married. 3 kids. 7 grandkids. No greatgrandkids.       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  All other systems  are negative.   Vital Signs: There were no vitals taken for this visit.  Physical Exam Constitutional:      Appearance: Normal appearance.  HENT:     Head: Normocephalic.     Mouth/Throat:     Mouth: Mucous membranes are moist.  Cardiovascular:     Comments: Non palpable left DP/PT Pulmonary:     Effort: Pulmonary effort is normal.  Abdominal:     General: There is no distension.  Musculoskeletal:     Comments: Cratering wound about the lateral aspect of the left foot at the metatarsal head with surrounding erythema encompassing the entire forefoot with some sloughing of skin around the toes.  Skin:    General: Skin is warm.  Neurological:     Mental Status: He is alert.     Imaging: IR Radiologist Eval & Mgmt  Result Date: 05/30/2020 Please refer to notes tab for details about interventional procedure. (Op Note)   Labs:  CBC: Recent Labs    07/08/19 1146 07/20/19 1639 02/09/20 1030 05/08/20 1020  WBC 9.0 10.0 7.2 7.0  HGB 12.1* 11.7* 12.5* 10.8*  HCT 36.5* 37.2* 38.0* 33.2*  PLT 320.0 291 239 197    COAGS: No results for input(s): INR, APTT in the last 8760 hours.  BMP: Recent Labs    07/08/19 1146 07/20/19 1639 02/09/20 1030 05/08/20 1020  NA 137 135 138 138  K 5.1 5.0 4.6 5.0  CL 102 96* 102 104  CO2 29 29 27 27   GLUCOSE 167* 191* 161* 196*  BUN 25* 18 24 27*  CALCIUM 9.4 9.1 9.3 9.2   CREATININE 1.13 1.10 1.39* 1.36*  GFRNONAA  --  >60 49* 51*  GFRAA  --  >60 57* 59*    LIVER FUNCTION TESTS: Recent Labs    07/08/19 1146 07/20/19 1639 02/09/20 1030  BILITOT 1.0 1.2 1.9*  AST 16 19 23   ALT 14 19 22   ALKPHOS 55 58  --   PROT 7.8 8.1 7.4  ALBUMIN 4.2 4.4  --     TUMOR MARKERS: No results for input(s): AFPTM, CEA, CA199, CHROMGRNA in the last 8760 hours.  Assessment and Plan:  75 year old male presenting with a non-healing left foot ulceration with associated osteomyelitis.  There is likely an arterial vascular component in addition to other contributing factors such as diabetes and superimposed infection given chronically mildly decreased left ABI (0.9 on 12/29/19) with monophasic left tibial waveforms and suggestion of moderate left popliteal stenosis on duplex (12/29/19), [place] 5.  We discussed possibilities for intervention including angioplasty, atherectomy, stenting, and recanalization.  He would like to proceed with any intervention that is minimally invasive and helps preserve his foot and helps heal if eventual surgery is required.    Will obtain CTA runoff and repeat ABIs for a new baseline, then pending findings, will proceed with angiogram and possible intervention.  Given his associated cellulitis, there is a degree of urgency for continuing this diagnostic workup and scheduling for intervention.  I emphasized continuing to take good local wound care and keep his Wound appointments.  Thank you for this interesting consult.  I greatly enjoyed [place]. and look forward to participating in their care.  A copy of this report was sent to the requesting provider on this date.  Electronically Signed: Suzette Battiest, MD 05/30/2020, 9:30 AM   I spent a total of  80 Minutes in face to face in clinical consultation, greater than 50% of  which was counseling/coordinating care for peripheral artery disease.

## 2020-06-01 ENCOUNTER — Ambulatory Visit
Admission: RE | Admit: 2020-06-01 | Discharge: 2020-06-01 | Disposition: A | Discharge status: Home / Self Care | Payer: PPO | Source: Ambulatory Visit | Attending: Internal Medicine | Admitting: Internal Medicine

## 2020-06-01 ENCOUNTER — Telehealth: Payer: Self-pay | Admitting: Interventional Radiology

## 2020-06-01 ENCOUNTER — Ambulatory Visit
Admission: RE | Admit: 2020-06-01 | Discharge: 2020-06-01 | Disposition: A | Discharge status: Home / Self Care | Payer: PPO | Source: Ambulatory Visit | Attending: Interventional Radiology | Admitting: Interventional Radiology

## 2020-06-01 DIAGNOSIS — K5730 Diverticulosis of large intestine without perforation or abscess without bleeding: Secondary | ICD-10-CM | POA: Diagnosis not present

## 2020-06-01 DIAGNOSIS — I10 Essential (primary) hypertension: Secondary | ICD-10-CM | POA: Diagnosis not present

## 2020-06-01 DIAGNOSIS — Z872 Personal history of diseases of the skin and subcutaneous tissue: Secondary | ICD-10-CM | POA: Diagnosis not present

## 2020-06-01 DIAGNOSIS — K8020 Calculus of gallbladder without cholecystitis without obstruction: Secondary | ICD-10-CM | POA: Diagnosis not present

## 2020-06-01 DIAGNOSIS — I739 Peripheral vascular disease, unspecified: Secondary | ICD-10-CM | POA: Diagnosis not present

## 2020-06-01 MED ORDER — IOPAMIDOL (ISOVUE-370) INJECTION 76%
125.0000 mL | Freq: Once | INTRAVENOUS | Status: AC | PRN
  Administered 2020-06-01: 125 mL via INTRAVENOUS

## 2020-06-01 NOTE — Telephone Encounter (Signed)
I called to discuss with Martin [name] the results of his ABIs and CTA runoff performed today.  He has a mildly abnormal left ABI and moderately abnormal left TBI.  CTA shows multifocal left SFA stenoses, mostly distal into the popliteal artery, as well as single vessel runoff bilaterally with proximal occlusion of the bilateral ATAs.    Overall, he is a candidate for angiogram with angioplasty of the femoropopliteal stenoses in addition to attempted recanalization of the left ATA.  We discussed the procedure in detail including risks and benefits.  He is amenable to proceed.    IR schedulers will work toward a date in the upcoming weeks at [facility].  Plan for: -moderate sedation -same day discharge, however patient is prepared to stay overnight if complications arise -no antibiotic prophylaxis -no need to hold aspirin prior to procedure   Ruthann Cancer, MD Pager: [phone]

## 2020-06-02 ENCOUNTER — Encounter (HOSPITAL_BASED_OUTPATIENT_CLINIC_OR_DEPARTMENT_OTHER): Payer: PPO | Attending: Internal Medicine | Admitting: Internal Medicine

## 2020-06-02 ENCOUNTER — Other Ambulatory Visit: Payer: Self-pay

## 2020-06-02 DIAGNOSIS — E11621 Type 2 diabetes mellitus with foot ulcer: Secondary | ICD-10-CM | POA: Diagnosis not present

## 2020-06-02 DIAGNOSIS — M86272 Subacute osteomyelitis, left ankle and foot: Secondary | ICD-10-CM | POA: Insufficient documentation

## 2020-06-02 DIAGNOSIS — L03116 Cellulitis of left lower limb: Secondary | ICD-10-CM | POA: Diagnosis not present

## 2020-06-02 DIAGNOSIS — E1151 Type 2 diabetes mellitus with diabetic peripheral angiopathy without gangrene: Secondary | ICD-10-CM | POA: Diagnosis not present

## 2020-06-02 DIAGNOSIS — L97522 Non-pressure chronic ulcer of other part of left foot with fat layer exposed: Secondary | ICD-10-CM | POA: Insufficient documentation

## 2020-06-02 DIAGNOSIS — L97529 Non-pressure chronic ulcer of other part of left foot with unspecified severity: Secondary | ICD-10-CM | POA: Diagnosis present

## 2020-06-02 NOTE — Progress Notes (Signed)
Martin [name] ([id number]) Visit Report for 06/02/2020 Arrival Information Details Patient Name: Date of Service: Martin [name] 06/02/2020 8:00 A M Medical Record Number: [id number] Patient Account Number: [id number] Date of Birth/Sex: Treating RN: 1945-11-26 (75 y.o. [name] Primary Care Jayvion Stefanski: [name] Other Clinician: Referring Evamae Rowen: Treating Kiernan Farkas/Extender: [name] in Treatment: 23 Visit Information History Since Last Visit All ordered tests and consults were completed: Yes Patient Arrived: [name] Added or deleted any medications: No Arrival Time: 07:55 Any new allergies or adverse reactions: No Accompanied By: self Had a fall or experienced change in No Transfer Assistance: None activities of daily living that may affect Patient Identification Verified: Yes risk of falls: Secondary Verification Process Completed: Yes Signs or symptoms of abuse/neglect since last visito No Patient Requires Transmission-Based Precautions: No Hospitalized since last visit: No Patient Has Alerts: No Implantable device outside of the clinic excluding No cellular tissue based products placed in the center since last visit: Has Dressing in Place as Prescribed: Yes Has Footwear/Offloading in Place as Prescribed: Yes Left: Wedge Shoe Pain Present Now: No Electronic Signature(s) Signed: 06/02/2020 4:56:10 PM By: Deon Pilling Entered By: Deon Pilling on 06/02/2020 08:07:54 -------------------------------------------------------------------------------- Encounter Discharge Information Details Patient Name: Date of Service: Martin [name]. 06/02/2020 8:00 A M Medical Record Number: [id number] Patient Account Number: [id number] Date of Birth/Sex: Treating RN: June 25, 1945 (75 y.o. [name] Primary Care Bernice Mullin: [name] Other Clinician: Referring Vallen Calabrese: Treating Quinnlyn Hearns/Extender: [name] in  Treatment: 23 Encounter Discharge Information Items Discharge Condition: Stable Ambulatory Status: Cane Discharge Destination: Home Transportation: Private Auto Accompanied By: self Schedule Follow-up Appointment: Yes Clinical Summary of Care: Electronic Signature(s) Signed: 06/02/2020 4:56:10 PM By: Deon Pilling Entered By: Deon Pilling on 06/02/2020 08:38:13 -------------------------------------------------------------------------------- Lower Extremity Assessment Details Patient Name: Date of Service: Martin [name] 06/02/2020 8:00 A M Medical Record Number: [id number] Patient Account Number: [id number] Date of Birth/Sex: Treating RN: 06/26/1945 (75 y.o. [name] Primary Care Autumm Hattery: [name] Other Clinician: Referring Ozell Juhasz: Treating Dameon Soltis/Extender: [name] in Treatment: 23 Edema Assessment Assessed: [name]: Yes] [name]: No] Edema: [Left: Ye] [Right: s] Calf Left: Right: Point of Measurement: 32 cm From Medial Instep 40.5 cm Ankle Left: Right: Point of Measurement: 11 cm From Medial Instep 27.5 cm Vascular Assessment Pulses: Dorsalis Pedis Palpable: [Left:Yes] Electronic Signature(s) Signed: 06/02/2020 4:56:10 PM By: Deon Pilling Entered By: Deon Pilling on 06/02/2020 08:08:30 -------------------------------------------------------------------------------- Multi Wound Chart Details Patient Name: Date of Service: Martin [name]. 06/02/2020 8:00 A M Medical Record Number: [id number] Patient Account Number: [id number] Date of Birth/Sex: Treating RN: 1946/03/16 (75 y.o. [name] Primary Care [name] [name]: [name] Other Clinician: Referring Nykayla Marcelli: Treating Prather Failla/Extender: [name] in Treatment: 23 Vital Signs Height(in): 72 Capillary Blood Glucose(mg/dl): 119 Weight(lbs): 270 Pulse(bpm): 6 Body Mass Index(BMI): 25 Blood Pressure(mmHg):  167/88 Temperature(F): 98.3 Respiratory Rate(breaths/min): 20 Photos: [5:No Photos Left Metatarsal head fifth] [N/A:N/A N/A] Wound Location: [5:Gradually Appeared] [N/A:N/A] Wounding Event: [5:Diabetic Wound/Ulcer of the Lower] [N/A:N/A] Primary Etiology: [5:Extremity Cataracts, Coronary Artery Disease,] [N/A:N/A] Comorbid History: [5:Hypertension, Type II Diabetes, Neuropathy 10/31/2019] [N/A:N/A] Date Acquired: [5:23] [N/A:N/A] Weeks of Treatment: [5:Open] [N/A:N/A] Wound Status: [5:1x1.4x0.5] [N/A:N/A] Measurements L x W x D (cm) [5:1.1] [N/A:N/A] A (cm) : rea [5:0.55] [N/A:N/A] Volume (cm) : [5:9.10%] [N/A:N/A] % Reduction in A rea: [5:35.10%] [N/A:N/A] % Reduction in Volume: [5:12] Starting Position 1 (o'clock): [5:6] Ending Position 1 (o'clock): [5:0.5] Maximum Distance 1 (cm): [5:Yes] [  N/A:N/A] Undermining: [5:Grade 3] [N/A:N/A] Classification: [5:Medium] [N/A:N/A] Exudate A mount: [5:Serosanguineous] [N/A:N/A] Exudate Type: [5:red, brown] [N/A:N/A] Exudate Color: [5:Well defined, not attached] [N/A:N/A] Wound Margin: [5:Large (67-100%)] [N/A:N/A] Granulation A mount: [5:Red, Hyper-granulation, Friable] [N/A:N/A] Granulation Quality: [5:Small (1-33%)] [N/A:N/A] Necrotic A mount: [5:Fat Layer (Subcutaneous Tissue): Yes N/A] Exposed Structures: [5:Fascia: No Tendon: No Muscle: No Joint: No Bone: No None] [N/A:N/A] Epithelialization: [5:Chemical Cauterization] [N/A:N/A] Treatment Notes Wound #5 (Metatarsal head fifth) Wound Laterality: Left Cleanser Peri-Wound Care Zinc Oxide Ointment 30g tube Discharge Instruction: Apply Zinc Oxide to periwound with each dressing change as needed for maceration Sween Lotion (Moisturizing lotion) Discharge Instruction: Apply moisturizing lotion as directed Topical Primary Dressing KerraCel Ag Gelling Fiber Dressing, 2x2 in (silver alginate) Discharge Instruction: Lightly pack into undermining Secondary Dressing Woven Gauze Sponge,  Non-Sterile 4x4 in Discharge Instruction: Apply over primary dressing as directed. Optifoam Non-Adhesive Dressing, 4x4 in Discharge Instruction: Foam donut Secured With Elastic Bandage 4 inch (ACE bandage) Discharge Instruction: Secure with ACE bandage as directed. Kerlix Roll Sterile, 4.5x3.1 (in/yd) Discharge Instruction: Secure with Kerlix as directed. Compression Wrap Compression Stockings Add-Ons Electronic Signature(s) Signed: 06/02/2020 4:22:00 PM By: Linton Ham MD Signed: 06/02/2020 4:47:51 PM By: Baruch Gouty RN, BSN Entered By: Linton Ham on 06/02/2020 08:47:00 -------------------------------------------------------------------------------- Multi-Disciplinary Care Plan Details Patient Name: Date of Service: Martin [name]. 06/02/2020 8:00 A M Medical Record Number: [id number] Patient Account Number: [id number] Date of Birth/Sex: Treating RN: September 29, 1945 (75 y.o. [name] Primary Care Jalyssa Fleisher: [name] Other Clinician: Referring Jennafer Gladue: Treating Laquenta Whitsell/Extender: [name] in Treatment: 23 Active Inactive Nutrition Nursing Diagnoses: Impaired glucose control: actual or potential Potential for alteratiion in Nutrition/Potential for imbalanced nutrition Goals: Patient/caregiver will maintain therapeutic glucose control Date Initiated: 12/22/2019 Target Resolution Date: 06/23/2020 Goal Status: Active Interventions: Assess HgA1c results as ordered upon admission and as needed Treatment Activities: Patient referred to Primary Care Physician for further nutritional evaluation : 12/22/2019 Notes: Osteomyelitis Nursing Diagnoses: Infection: osteomyelitis Knowledge deficit related to disease process and management Goals: Patient's osteomyelitis will resolve Date Initiated: 05/05/2020 Target Resolution Date: 07/21/2020 Goal Status: Active Interventions: Assess for signs and symptoms of osteomyelitis resolution  every visit Provide education on osteomyelitis Screen for HBO Treatment Activities: Consult for HBO : 05/05/2020 MRI : 04/17/2020 Systemic antibiotics : 05/05/2020 Notes: Soft Tissue Infection Nursing Diagnoses: Impaired tissue integrity Potential for infection: soft tissue Goals: Patient's soft tissue infection will resolve Date Initiated: 03/29/2020 Target Resolution Date: 06/23/2020 Goal Status: Active Interventions: Assess signs and symptoms of infection every visit Provide education on infection Treatment Activities: Culture and sensitivity : 03/29/2020 Education provided on Infection : 05/26/2020 Systemic antibiotics : 03/29/2020 T ordered outside of clinic : 03/29/2020 est Notes: Wound/Skin Impairment Nursing Diagnoses: Impaired tissue integrity Knowledge deficit related to ulceration/compromised skin integrity Goals: Patient/caregiver will verbalize understanding of skin care regimen Date Initiated: 12/22/2019 Target Resolution Date: 06/23/2020 Goal Status: Active Ulcer/skin breakdown will have a volume reduction of 30% by week 4 Date Initiated: 12/22/2019 Date Inactivated: 01/19/2020 Target Resolution Date: 01/19/2020 Goal Status: Unmet Unmet Reason: infection Ulcer/skin breakdown will have a volume reduction of 50% by week 8 Date Initiated: 01/19/2020 Date Inactivated: 02/16/2020 Target Resolution Date: 02/16/2020 Goal Status: Unmet Unmet Reason: infection Ulcer/skin breakdown will have a volume reduction of 80% by week 12 Date Initiated: 02/16/2020 Date Inactivated: 03/15/2020 Target Resolution Date: 03/15/2020 Goal Status: Unmet Unmet Reason: osteo, getting HBO Interventions: Assess patient/caregiver ability to obtain necessary supplies Assess patient/caregiver ability to perform ulcer/skin care regimen upon  admission and as needed Assess ulceration(s) every visit Provide education on ulcer and skin care Treatment Activities: Skin care regimen initiated :  12/22/2019 Topical wound management initiated : 12/22/2019 Notes: Electronic Signature(s) Signed: 06/02/2020 4:47:51 PM By: Baruch Gouty RN, BSN Entered By: Baruch Gouty on 06/02/2020 07:59:15 -------------------------------------------------------------------------------- Pain Assessment Details Patient Name: Date of Service: Martin [name]. 06/02/2020 8:00 A M Medical Record Number: [id number] Patient Account Number: [id number] Date of Birth/Sex: Treating RN: [birth year] (75 y.o. [name] Primary Care Caresse Sedivy: [name] Other Clinician: Referring Filimon Miranda: Treating Donetta Isaza/Extender: [name] in Treatment: 23 Active Problems Location of Pain Severity and Description of Pain Patient Has Paino No Site Locations Rate the pain. Current Pain Level: 0 Pain Management and Medication Current Pain Management: Medication: No Cold Application: No Rest: No Massage: No Activity: No T.E.N.S.: No Heat Application: No Leg drop or elevation: No Is the Current Pain Management Adequate: Adequate How does your wound impact your activities of daily livingo Sleep: No Bathing: No Appetite: No Relationship With Others: No Bladder Continence: No Emotions: No Bowel Continence: No Work: No Toileting: No Drive: No Dressing: No Hobbies: No Electronic Signature(s) Signed: 06/02/2020 4:56:10 PM By: Deon Pilling Entered By: Deon Pilling on 06/02/2020 08:08:19 -------------------------------------------------------------------------------- Patient/Caregiver Education Details Patient Name: Date of Service: Nicolosi, Keyion W. 2/4/2022andnbsp8:00 A M Medical Record Number: [id number] Patient Account Number: [id number] Date of Birth/Gender: Treating RN: 09/24/1945 (75 y.o. [name] Primary Care Physician: [name] Other Clinician: Referring Physician: Treating Physician/Extender: [name] in  Treatment: 23 Education Assessment Education Provided To: Patient Education Topics Provided Infection: Methods: Explain/Verbal Responses: Reinforcements needed, State content correctly Offloading: Methods: Explain/Verbal Responses: Reinforcements needed, State content correctly Wound/Skin Impairment: Methods: Explain/Verbal Responses: Reinforcements needed, State content correctly Electronic Signature(s) Signed: 06/02/2020 4:47:51 PM By: Baruch Gouty RN, BSN Entered By: Baruch Gouty on 06/02/2020 08:00:09 -------------------------------------------------------------------------------- Wound Assessment Details Patient Name: Date of Service: Martin [name]. 06/02/2020 8:00 A M Medical Record Number: [id number] Patient Account Number: [id number] Date of Birth/Sex: Treating RN: November 30, 1945 (75 y.o. [name], [name] Primary Care Dorrene Bently: [name] Other Clinician: Referring Omri Bertran: Treating Aizah Gehlhausen/Extender: [name] in Treatment: 23 Wound Status Wound Number: 5 Primary Diabetic Wound/Ulcer of the Lower Extremity Etiology: Wound Location: Left Metatarsal head fifth Wound Open Wounding Event: Gradually Appeared Status: Date Acquired: 10/31/2019 Comorbid Cataracts, Coronary Artery Disease, Hypertension, Type II Weeks Of Treatment: 23 History: Diabetes, Neuropathy Clustered Wound: No Wound Measurements Length: (cm) 1 Width: (cm) 1.4 Depth: (cm) 0.5 Area: (cm) 1.1 Volume: (cm) 0.55 % Reduction in Area: 9.1% % Reduction in Volume: 35.1% Epithelialization: None Tunneling: No Undermining: Yes Starting Position (o'clock): 12 Ending Position (o'clock): 6 Maximum Distance: (cm) 0.5 Wound Description Classification: Grade 3 Wound Margin: Well defined, not attached Exudate Amount: Medium Exudate Type: Serosanguineous Exudate Color: red, brown Foul Odor After Cleansing: No Slough/Fibrino Yes Wound Bed Granulation Amount: Large  (67-100%) Exposed Structure Granulation Quality: Red, Hyper-granulation, Friable Fascia Exposed: No Necrotic Amount: Small (1-33%) Fat Layer (Subcutaneous Tissue) Exposed: Yes Necrotic Quality: Adherent Slough Tendon Exposed: No Muscle Exposed: No Joint Exposed: No Bone Exposed: No Treatment Notes Wound #5 (Metatarsal head fifth) Wound Laterality: Left Cleanser Peri-Wound Care Zinc Oxide Ointment 30g tube Discharge Instruction: Apply Zinc Oxide to periwound with each dressing change as needed for maceration Sween Lotion (Moisturizing lotion) Discharge Instruction: Apply moisturizing lotion as directed Topical Primary Dressing KerraCel Ag Gelling Fiber Dressing, 2x2 in (silver alginate) Discharge Instruction: Lightly  pack into undermining Secondary Dressing Woven Gauze Sponge, Non-Sterile 4x4 in Discharge Instruction: Apply over primary dressing as directed. Optifoam Non-Adhesive Dressing, 4x4 in Discharge Instruction: Foam donut Secured With Elastic Bandage 4 inch (ACE bandage) Discharge Instruction: Secure with ACE bandage as directed. Kerlix Roll Sterile, 4.5x3.1 (in/yd) Discharge Instruction: Secure with Kerlix as directed. Compression Wrap Compression Stockings Add-Ons Electronic Signature(s) Signed: 06/02/2020 4:56:10 PM By: Deon Pilling Entered By: Deon Pilling on 06/02/2020 08:10:01 -------------------------------------------------------------------------------- Vitals Details Patient Name: Date of Service: Martin [name]. 06/02/2020 8:00 A M Medical Record Number: [id number] Patient Account Number: [id number] Date of Birth/Sex: Treating RN: November 05, 1945 (75 y.o. [name] Primary Care Johnita Palleschi: [name] Other Clinician: Referring Aiyanna Awtrey: Treating Cianni Manny/Extender: [name] in Treatment: 23 Vital Signs Time Taken: 08:57 Temperature (F): 98.3 Height (in): 72 Pulse (bpm): 71 Weight (lbs): 270 Respiratory Rate  (breaths/min): 20 Body Mass Index (BMI): 36.6 Blood Pressure (mmHg): 167/88 Capillary Blood Glucose (mg/dl): 119 Reference Range: 80 - 120 mg / dl Electronic Signature(s) Signed: 06/02/2020 4:56:10 PM By: Deon Pilling Entered By: Deon Pilling on 06/02/2020 08:08:10

## 2020-06-02 NOTE — Progress Notes (Signed)
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 doing better in regard to his foot ulcer in my opinion. Things are actually looking good in this regard. With that being said he does have some side effects from the linezolid. He tells me that he has been somewhat nauseated but mainly when he does not eat with the medication. Evenings are okay  the mornings when he tends to eat less sometimes seem to be worse. With that being said this seems to be something that he can mitigate. With that being said he also has a rash however in the groin area that he tells me about today. He thinks that this may be due to the medication. Subsequently I think that it is due to the medication in a roundabout way and that the patient has what appears to be a yeast infection/tinea cruris which is likely indirectly secondary related to the fact that the patient has been on strong antibiotics for some time now due to the osteomyelitis. However I do not think it is a direct side effect of the medication itself per se. 02/23/2020 upon evaluation the patient appears to be doing decently well in regard to his foot ulcer in fact I am very pleased with how things appear today. He does have less undermining and overall I think between the cast and now the start of the hyperbaric  oxygen therapy he has a very good chance of getting this wound to heal. Fortunately there is no signs of active infection at this time which is great news. No fevers, chills, nausea, vomiting, or diarrhea. He does note that he has been having some issues with the linezolid causing him to be nauseated and he also states that it is caused him to lose his smell and taste. With that being said I am really not certain that this is indeed the reason behind this but either way I think we can switch the antibiotic being that he is looking so well with minimal linezolid for close to 3-4 weeks at this point. We will do 2 weeks of Bactrim and then hopefully he should be complete with the antibiotic courses. 03/01/2020 on evaluation today patient is making good progress in regard to his wound. This is measuring smaller today and overall very pleased. The antibiotics which has seemed to help him he states that he is having a lot of improvement overall in his smell and taste as well as improvement in the overall  nausea that he was experiencing with the linezolid. Obviously this is great news. 03/08/2020 upon evaluation today patient appears to be doing well with regard to his foot ulcer. I feel like this is showing signs of improvement it is measuring a little bit better today compared to previous. With that being said there is no evidence of active infection which is great news and in general I feel like the hyperbarics is helping him along with the antibiotics which she will be completing I believe in the next week. Outside of this also feel like that the total contact cast obviously has been of benefit. 03/15/2020 on evaluation today patient appears to be doing well in regard to his foot ulcer. He has been tolerating the dressing changes without complication. Fortunately there is no signs of active infection at this time. No fevers, chills, nausea, vomiting, or diarrhea. Patient is tolerating hyperbarics quite well at this time and I see no signs of infection currently which is great news. 03/22/2020 on evaluation today patient appears to be doing about the same in regard to his foot ulcer. He does have some tissue along the medial portion of the wound bed and I am unsure of exactly what this is just characteristically. It could be a small amount of tendon here to be honest. With that being said I do think it needs to be removed as I feel like it is hindering his healing possibilities. 11/29; acute visit; I was asked to see the patient urgently today because of complaints of pain in his foot. He is a wound in the right fifth metatarsal head plantar aspect with underlying osteomyelitis. He has been undergoing hyperbaric oxygen treatment completing antibiotics in the middle of this month. He said the pain had increased to the point that it was becoming difficult for him to walk on his foot. He has not been systemically unwell.. He had a fairly significant debridement on his last visit 5 days ago. 03/29/2020  on evaluation today patient appears to be doing much better than he was on Monday according to the pictures and what I saw. Fortunately there is no signs of active infection at this time which is great news. There is no evidence of systemic infection either. Overall I feel like the patient is doing indeed much better and is just a matter of having him continue the antibiotic for the time being in my opinion  and then depending on the results of the culture we may extend that for a bit longer. 12/10; I am seeing the patient today because of difficulty had this week with his wife's illness. He could not come on Wednesday. The wound looks a lot better than what I usually am used to seeing. He is using silver alginate and a total contact cast continuing with HBO. He has completed his antibiotics cultures and x-rays were negative 04/12/2020 upon evaluation today patient unfortunately appears to be doing a little worse in regard to his wound from the standpoint of erythema surrounding. There does not appear to be any signs of active systemic infection which is great news. Nonetheless I am concerned about the fact that if we put him back in the cast he will not be able to visually see what was going on in regard to his foot and if anything is worsening he would have no idea into it could be potentially too late. 12/17; surrounding his dive yesterday afternoon it was brought to my attention about the extent of swelling in the left leg. I really did not have enough time to fully evaluate this before the end of his treatment so I arranged to have a duplex ultrasound done rule out DVT which thankfully turned out to be negative. I note that he was felt to have cellulitis of the left foot surrounding his wound when he was seen on Wednesday by Allen Derry and was put on trimethoprim sulfamethoxazole. His report showed no evidence of a DVT or SVT . I am seeing him today in follow-up of this. As it turns out his calf  measurements have not really changed that much but he has much more swelling in the ankle. It could be because we did not put a total contact cast back on him. I think he has some degree of chronic venous insufficiency. 12/20; I wanted to look at the patient's leg when he was up on the table before he went into hyperbarics however he was put on the schedule today. He is still taking trimethoprim sulfamethoxazole. Culture that was done last Wednesday is negative. We put him in kerlix Coban wrap over the weekend still a lot of swelling in this leg. DVT rule out was negative there is no swelling in his thigh not sure I really have a good explanation for this. He is not complaining of systemic symptoms 04/19/2020 upon evaluation today patient appears to be doing somewhat poorly in regard to his foot ulcer. Again he has seen Dr. Leanord Hawking Dr. Leanord Hawking did order repeat MRI after I agree with this and think that is the right way to go at this point. Depending on the results of the MRI I discussed with the patient today that we may be looking at a referral potentially for surgery consultation if this occurs the patient would like to see someone for second opinion other than Dr. Lajoyce Corners I understand and I think we can definitely do that I have never discourage patients from second opinions. With that being said currently I do not see any evidence of worsening infection though there still appears to be significant erythema. The patient's culture did show that he is on the appropriate antibiotic with the Bactrim DS which I did prescribe for him last week he has another week of this remaining. I think he can continue to take that for the time being. 12/28; patient comes back in the clinic today with a lot of drainage on the outside  of his left dorsal foot. Very significant erythema on the lateral part of the foot and dorsal part of the foot which I have marked with a marker. Also have his MRI to review that showed findings  suspicious for progressive osteomyelitis involving the fifth metatarsal head and proximal phalanx no drainable fluid collection. This is obviously worse since his last study in September. He has not been systemically unwell. Says he is not running a fever his blood sugars are stable. 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 For this reason he is completely opposed to any surgery presently. Furthermore his arterial status might not allow foot conserving surgery. I have reviewed his arterial studies from 9/1. This showed an ABI on the left of 0.90 however his TBI was only 0.44 but a toe pressure of 68. Monophasic waveforms. I wonder if he has tibial artery calcification and his ABIs are actually falsely elevated. 1/7; 1 week follow-up. Patient has completed the linezolid I gave him for MRSA cellulitis surrounding the wound in the left foot. This extended into his foot dorsally. This looks a lot better today and he has completed his antibiotics. He did not see infectious disease on Monday because her office was closed due to the weather however he has an appointment on Monday next. I have rediscussed his arterial status and I think the best thing to do would be to send him to an interventionalists. The patient has a good personal relationship with Dr. Kathlene Cote of interventional radiology and will send him there for either repeat noninvasive studies or consideration of an angiogram. If we were to consider foot conserving surgery [ray amputation] he will need to have the vascular status to heal this. He is certainly not going to consider a below-knee amputation at this point. I am hoping to get him started on oIV antibiotics through infectious disease. 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 1/28; have not seen this patient in a few weeks. He did not see interventional radiology about his arterial  status but is scheduled to do so early next week. He also has a follow-up with Dr. Drucilla Schmidt on 2/7 with regards to osteomyelitis. Lab work showed a creatinine of 1.36 and estimated GFR of 51 sedimentation rate of 53 and a CRP of 2.8. Dr. Drucilla Schmidt discussed the issues of amputation. I have told the patient today that certainly all of the surgical options currently may become necessary if the wound deteriorates or spreads. If his arterial evaluation is satisfactory then he might be able to get away with leg conserving surgery. 2/4; the patient had his CT angiogram. In short this showed posterior tibial arteries is the only blood flow source with single-vessel runoff bilaterally. They did not comment in osteomyelitis in the left fifth met at although they may not specifically of looked at this area. He will see Dr. Drucilla Schmidt on 2/7 for his final suggestion about antibiotics. On 2/16 or 2/18 he is going for an attempt at revascularization in the left foot I think retrograde revascularization. This may give [place] enough blood flow to heal this wound or possibly a ray amputation. The patient is definitive he would not consider a below-knee amputation for this perceivable future because of the need to be a caregiver for his wife. He is not currently on antibiotic Objective Constitutional Vitals Time Taken: 8:57 AM, Height: 72 in, Weight: 270 lbs, BMI: 36.6,  Temperature: 98.3 F, Pulse: 71 bpm, Respiratory Rate: 20 breaths/min, Blood Pressure: 167/88 mmHg, Capillary Blood Glucose: 119 mg/dl. Integumentary (Hair, Skin) Wound #5 status is Open. Original cause of wound was Gradually Appeared. The wound is located on the Left Metatarsal head fifth. The wound measures 1cm length x 1.4cm width x 0.5cm depth; 1.1cm^2 area and 0.55cm^3 volume. There is Fat Layer (Subcutaneous Tissue) exposed. There is no tunneling noted, however, there is undermining starting at 12:00 and ending at 6:00 with a maximum distance of 0.5cm.  There is a medium amount of serosanguineous drainage noted. The wound margin is well defined and not attached to the wound base. There is large (67-100%) red, friable, hyper - granulation within the wound bed. There is a small (1-33%) amount of necrotic tissue within the wound bed including Adherent Slough. 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 An Chemical Cauterization procedure was performed by Ricard Dillon., MD. Post procedure Diagnosis Wound #5: Same as Pre-Procedure Notes: using silver nitrate stick Plan Follow-up Appointments: Return Appointment in 1 week. Bathing/ Shower/ Hygiene: May shower with protection but do not get wound dressing(s) wet. Edema Control - Lymphedema / SCD / Other: Elevate legs to the level of the heart or above for 30 minutes daily and/or when sitting, a frequency of: Avoid standing for long periods of time. Patient to wear own compression stockings every day. Moisturize legs daily. Compression stocking or Garment 20-30 mm/Hg pressure to: - both legs daily Off-Loading: Wedge shoe to: - front offloading shoe with felt to offload lateral foot Additional Orders / Instructions: Other: - Go to emergency room for fever, chills, increased redness, increased pain in left foot WOUND #5: - Metatarsal head fifth Wound Laterality: Left Peri-Wound Care: Zinc Oxide Ointment 30g tube 1 x Per Day/30 Days Discharge Instructions: Apply Zinc Oxide to periwound with each dressing change as needed for maceration Peri-Wound Care: Sween Lotion (Moisturizing  lotion) 1 x Per Day/30 Days Discharge Instructions: Apply moisturizing lotion as directed Prim Dressing: KerraCel Ag Gelling Fiber Dressing, 2x2 in (silver alginate) 1 x Per Day/30 Days ary Discharge Instructions: Lightly pack into undermining Secondary Dressing: Woven Gauze Sponge, Non-Sterile 4x4 in 1 x Per Day/30 Days Discharge Instructions: Apply over primary dressing as directed. Secondary Dressing: Optifoam Non-Adhesive Dressing, 4x4 in 1 x Per Day/30 Days Discharge Instructions: Foam donut Secured With: Elastic Bandage 4 inch (ACE bandage) 1 x Per Day/30 Days Discharge Instructions: Secure with ACE bandage as directed. Secured With: [employer], 4.5x3.1 (in/yd) 1 x Per Day/30 Days Discharge Instructions: Secure with Kerlix as directed. 1. I will continue with the silver alginate as the primary dressing 2. Looking forward to Dr. Arlyss Queen antibiotic recommendations. 3. I am glad that interventional radiology will attempt I think a retrograde revascularization. This might give [place] a chance to heal this wound or at the very worst 1/5 ray amputation if things deteriorate. 4. He is already been through HBO. For various reasons I doubt he would agree to another course of this. He also is fairly blunt about below-knee amputations which he will not agree to at this time for among other reasons the  illness in his wife Electronic Signature(s) Signed: 06/02/2020 4:22:00 PM By: Linton Ham MD Entered By: Linton Ham on 06/02/2020 08:52:00 -------------------------------------------------------------------------------- SuperBill Details Patient Name: Date of Service: Martin, [name] 06/02/2020 Medical Record Number: [id number] Patient Account Number: [id number] Date of Birth/Sex: Treating RN: [birth year] (75 y.o. [name] Primary Care Provider: [name] Other Clinician: Referring Provider: Treating Provider/Extender: [name] in  Treatment: 23 Diagnosis Coding ICD-10 Codes Code Description E11.621 Type 2 diabetes mellitus with foot ulcer L97.522 Non-pressure chronic ulcer of other part of left foot with fat layer exposed M86.272 Subacute osteomyelitis, left ankle and foot L03.116 Cellulitis of left lower limb E11.51 Type 2 diabetes mellitus with diabetic peripheral angiopathy without gangrene Facility Procedures CPT4 Code: [id number] Description: 50158 - CHEM CAUT GRANULATION TISS ICD-10 Diagnosis Description L97.522 Non-pressure chronic ulcer of other part of left foot with fat layer exposed Modifier: Quantity: 1 Physician Procedures : CPT4 Code Description Modifier [id number] - WC PHYS CHEM CAUT GRAN TISSUE ICD-10 Diagnosis Description L97.522 Non-pressure chronic ulcer of other part of left foot with fat layer exposed Quantity: 1 Electronic Signature(s) Signed: 06/02/2020 4:22:00 PM By: Linton Ham MD Entered By: Linton Ham on 06/02/2020 08:52:11

## 2020-06-05 ENCOUNTER — Ambulatory Visit (INDEPENDENT_AMBULATORY_CARE_PROVIDER_SITE_OTHER): Payer: PPO | Admitting: Infectious Disease

## 2020-06-05 ENCOUNTER — Other Ambulatory Visit: Payer: Self-pay | Admitting: Interventional Radiology

## 2020-06-05 ENCOUNTER — Encounter: Payer: Self-pay | Admitting: Infectious Disease

## 2020-06-05 ENCOUNTER — Other Ambulatory Visit: Payer: Self-pay

## 2020-06-05 VITALS — BP 179/89 | HR 63 | Temp 97.0°F | Resp 16 | Ht 73.0 in | Wt 264.0 lb

## 2020-06-05 DIAGNOSIS — B351 Tinea unguium: Secondary | ICD-10-CM | POA: Diagnosis not present

## 2020-06-05 DIAGNOSIS — M869 Osteomyelitis, unspecified: Secondary | ICD-10-CM

## 2020-06-05 DIAGNOSIS — E1342 Other specified diabetes mellitus with diabetic polyneuropathy: Secondary | ICD-10-CM | POA: Diagnosis not present

## 2020-06-05 DIAGNOSIS — A0472 Enterocolitis due to Clostridium difficile, not specified as recurrent: Secondary | ICD-10-CM | POA: Diagnosis not present

## 2020-06-05 MED ORDER — LINEZOLID 600 MG PO TABS: 600.0000 mg | ORAL_TABLET | Freq: Two times a day (BID) | ORAL | 3 refills | Status: DC

## 2020-06-05 MED ORDER — CLOPIDOGREL BISULFATE 75 MG PO TABS: 75.0000 mg | ORAL_TABLET | Freq: Every day | ORAL | 0 refills | Status: DC

## 2020-06-05 NOTE — Progress Notes (Signed)
Complaint follow-up for diabetic foot infection  Subjective:    Patient ID: Martin Agee Sr., male    DOB: 1945/09/28, 75 y.o.   MRN: [id number]  HPI   Martin [name] is a 75 year old Caucasian man with a past medical history significant for coronary artery disease status post coronary bypass grafting, diabetes mellitus peripheral artery disease who has been struggling with an ulcer in his foot for several years now.   UNFortunately his developed osteomyelitis.  He was referred to Dr. Sharol Given who offered him surgery but the patient was not ready to do this yet.  There is also concern about his ability to heal any surgery in his foot due to his peripheral vascular disease.  He may in fact require a proximal amputation such as a below the knee amputation for cure.  At present he is in a very difficult situation socially and that he is caring for his wife and is the sole caregiver.  His wife has metastatic breast cancer that metastasized to bone and she is currently bedbound.  He is not ready to have any type of surgery including surgery on the foot at this point in time but wants to see his wife improving first over more resolution on not from prior to allowing himself to be hospitalized for a surgical intervention.  He has been followed closely by the wound care clinic and Dr. Dellia Nims who gave him a prescription for Zyvox when the patient developed erythema that went up his leg and swelling.  He responded well to Zyvox he also had had DVT excluded through duplex.  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  As I last saw him he has continued to stable off antibiotics and is continue to see Dr. Dellia Nims with wound care he says that with eyedrops and does work on the wound his foot  will sometimes swell up for a day or 2 but then goes back down again.  He is being seen by our interventional radiology to see if they can improve his blood flow.       Past Medical History:  Diagnosis Date  . A-fib ([place])   . Diabetes mellitus   . Heart attack ([place]) 10/01/2018   Pt had open heart surgery  . Hypertension   . Osteomyelitis of fifth toe of left foot ([place]) 05/08/2020  . 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 Tobacco abuse     Past Surgical History:  Procedure Laterality Date  . CORONARY ARTERY BYPASS GRAFT N/A 10/01/2018   Procedure: CORONARY ARTERY BYPASS GRAFTING (CABG) TIMES  FOUR USING LEFT MAMMARY ARTERY AND RIGHT GREATER SAPHEANOUS VEIN HARVESTED ENDOSCOPICALLY;  Surgeon: Melrose Nakayama, MD;  Location: [facility];  Service: Open Heart Surgery;  Laterality: N/A;  . HERNIA REPAIR     >10 years ago  . IR RADIOLOGIST EVAL & MGMT  12/24/2016  . IR RADIOLOGIST EVAL & MGMT  05/30/2020  . IR THORACENTESIS ASP PLEURAL SPACE W/IMG GUIDE  10/06/2018  . LEFT HEART CATH AND CORONARY ANGIOGRAPHY N/A 09/29/2018   Procedure: LEFT HEART CATH AND CORONARY ANGIOGRAPHY;  Surgeon: Troy Sine, MD;  Location: [facility];  Service: Cardiovascular;  Laterality: N/A;  . open heart surfery    . right shoulder surgery     around  2014  . TEE WITHOUT CARDIOVERSION N/A 10/01/2018   Procedure: TRANSESOPHAGEAL ECHOCARDIOGRAM (TEE);  Surgeon: Melrose Nakayama, MD;  Location: [facility];  Service: Open Heart Surgery;  Laterality: N/A;    Family History  Problem Relation Age of Onset  . Hypertension Mother   . Heart disease Mother        CHF did not see doctor  . Diabetes Maternal Grandmother   . Diabetes Son       Social History   Socioeconomic History  . Marital status: Married    Spouse name: Not on file  . Number of children: Not on file  . Years of education: Not on file  . Highest education level: Not on file  Occupational History  . Not on file  Tobacco Use   . Smoking status: Former Smoker    Packs/day: 0.75    Years: 2.00    Pack years: 1.50    Types: Cigarettes    Start date: 2002    Quit date: 04/29/2002    Years since quitting: 18.1  . Smokeless tobacco: Never Used  . Tobacco comment: smoked for 10 years on and off  Substance and Sexual Activity  . Alcohol use: Yes    Alcohol/week: 0.0 standard drinks    Comment: 6 martinis a year  . Drug use: No  . Sexual activity: Yes  Other Topics Concern  . Not on file  Social History Narrative   Married. 3 kids. 7 grandkids. No greatgrandkids.       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 Simvastatin Diarrhea  . Statins Other (See Comments)    achey joints     Current Outpatient Medications:  .  aspirin 81 MG EC tablet, Take 1 tablet (81 mg total) by mouth daily., Disp: , Rfl:  .  ezetimibe (ZETIA) 10 MG tablet, TAKE 1 TABLET BY MOUTH EVERY DAY, Disp: 90 tablet, Rfl: 0 .  insulin aspart (NOVOLOG FLEXPEN) 100 UNIT/ML FlexPen, 3 times a day (just before each meal) 25-20-25 units, and pen needles 4/day, Disp: 25 pen, Rfl: 3 .  insulin glargine (LANTUS SOLOSTAR) 100 UNIT/ML Solostar Pen, Inject 10 Units into the skin at bedtime., Disp: 5 pen, Rfl: PRN .  Insulin Pen Needle (NOVOFINE) 32G X 6 MM MISC, USE 5 TIMES DAILY, Disp: 450 each, Rfl: 2 .  LIVALO 1 MG TABS, TAKE 1 TABLET BY MOUTH EVERY DAY, Disp: 90 tablet, Rfl: 3 .  losartan (COZAAR) 25 MG tablet, Take 1 tablet (25 mg total) by mouth daily., Disp: 90 tablet, Rfl: 1 .  metoprolol tartrate (LOPRESSOR) 50 MG tablet, TAKE 1 TABLET BY MOUTH TWICE A DAY, Disp: 180 tablet, Rfl: 1 .  ONETOUCH VERIO test  strip, CHECK LEVELS 4 TIMES DAILY, Disp: 150 strip, Rfl: 1 .  clopidogrel (PLAVIX) 75 MG tablet, Take 1 tablet (75 mg total) by mouth daily. (Patient not taking: Reported on 06/05/2020), Disp: 90 tablet, Rfl: 0 .  linezolid (ZYVOX) 600 MG tablet, Take 600 mg by mouth 2 (two) times daily. (Patient not taking: Reported on 06/05/2020), Disp: , Rfl:    Review of Systems  Constitutional: Negative for activity change, appetite change, chills, diaphoresis, fatigue, fever and unexpected weight change.  HENT: Negative for congestion, rhinorrhea, sinus pressure, sneezing, sore throat and trouble swallowing.   Eyes: Negative for photophobia and visual disturbance.  Respiratory: Negative for cough, chest tightness, shortness of breath, wheezing and stridor.   Cardiovascular: Negative for chest pain, palpitations and leg swelling.  Gastrointestinal: Negative for abdominal distention, abdominal pain, anal bleeding, blood in stool, constipation, diarrhea, nausea and vomiting.  Genitourinary: Negative for difficulty urinating, dysuria, flank pain and hematuria.  Musculoskeletal: Negative for arthralgias, back pain, gait problem, joint swelling and myalgias.  Skin: Positive for color change and wound. Negative for pallor and rash.  Neurological: Negative for dizziness, tremors, weakness and light-headedness.  Hematological: Negative for adenopathy. Does not bruise/bleed easily.  Psychiatric/Behavioral: Negative for agitation, behavioral problems, confusion, decreased concentration, dysphoric mood and sleep disturbance.       Objective:   Physical Exam Constitutional:      General: He is not in acute distress.    Appearance: Normal appearance. He is well-developed. He is not ill-appearing or diaphoretic.  HENT:     Head: Normocephalic and atraumatic.     Right Ear: Hearing and external ear normal.     Left Ear: Hearing and external ear normal.     Nose: No nasal deformity or rhinorrhea.  Eyes:     General:  No scleral icterus.    Conjunctiva/sclera: Conjunctivae normal.     Right eye: Right conjunctiva is not injected.     Left eye: Left conjunctiva is not injected.     Pupils: Pupils are equal, round, and reactive to light.  Neck:     Vascular: No JVD.  Cardiovascular:     Rate and Rhythm: Normal rate and regular rhythm.     Heart sounds: S1 normal and S2 normal.  Pulmonary:     Effort: Pulmonary effort is normal. No respiratory distress.  Abdominal:     General: There is no distension.     Palpations: Abdomen is soft.     Tenderness: There is no abdominal tenderness.  Musculoskeletal:        General: Normal range of motion.     Right shoulder: Normal.     Left shoulder: Normal.     Cervical back: Normal range of motion and neck supple.     Right hip: Normal.     Left hip: Normal.     Right knee: Normal.     Left knee: Normal.  Lymphadenopathy:     Head:     Right side of head: No submandibular, preauricular or posterior auricular adenopathy.     Left side of head: No submandibular, preauricular or posterior auricular adenopathy.     Cervical: No cervical adenopathy.     Right cervical: No superficial or deep cervical adenopathy.    Left cervical: No superficial or deep cervical adenopathy.  Skin:    General: Skin is warm and dry.     Coloration: Skin is not pale.     Findings: No abrasion, bruising, ecchymosis, erythema, lesion or rash.     Nails: There is no clubbing.  Neurological:     General: No focal deficit present.     Mental Status: He is alert and oriented to person, place, and time.     Sensory: No sensory deficit.     Coordination: Coordination normal.     Gait: Gait normal.  Psychiatric:        Attention and Perception:  He is attentive.        Mood and Affect: Mood normal.        Speech: Speech normal.        Behavior: Behavior normal. Behavior is cooperative.        Thought Content: Thought content normal.        Judgment: Judgment normal.     Left  foot 05/08/2020:    Foot pictured today June 05, 2020:            Assessment & Plan:  Osteomyelitis of left foot: in terms of cure he needs an amputation. Issues are multiple.   First of all because his wife is still requiring significant assistance from him it does not sound that he could even be away from home for more than a few days that he would need to have surgery for an amputation.  Secondly we do not know if he has sufficient blood flow to heal surgery in the foot and there is still the question of whether he would need a below the knee amputation which require much more time away from being the primary caregiver for his wife.  Let us see how well the IR can improve his blood flow.  If blood flow can be sufficiently improved would then revisit the idea of amputation of fifth metatarsal or more proximally.  I continue to  think that IV antibiotics DO NOT have much of a role and less we are going for some type of cure with an amputation though I am certainly willing to give him IV antibiotics to try to "cool down infection in his foot we are not going to be eradicating bone infection with it.  Also willing to consider a targeted approach in which he stays off antibiotics and 1 could get a bone biopsy for culture and hopefully find out that there is an organism other than the MRSA responsible for this infection.  For the time being I would like him to have a bottle of linezolid at home that he has on hand should he have evidence of cellulitis coming on.  Note I do not think that Bactrim is going to be a drug we can use given concern for hyperkalemia and serum creatinine elevation particular the doses we would want to use to treat him with.  Clindamycin would pose a high risk for C. difficile colitis and he has already had that once before.  I spent greater than 40  minutes with the patient including greater than 50% of time in face to face counsel of the patient and his  osteomyelitis modalities would need to be used to treated and to cure it and in coordination of his care.  We will have him see me again shortly after he has had his interventional radiology procedure  .

## 2020-06-06 ENCOUNTER — Other Ambulatory Visit (HOSPITAL_COMMUNITY): Payer: Self-pay | Admitting: Interventional Radiology

## 2020-06-06 DIAGNOSIS — I998 Other disorder of circulatory system: Secondary | ICD-10-CM

## 2020-06-06 DIAGNOSIS — S81802A Unspecified open wound, left lower leg, initial encounter: Secondary | ICD-10-CM

## 2020-06-09 ENCOUNTER — Encounter (HOSPITAL_BASED_OUTPATIENT_CLINIC_OR_DEPARTMENT_OTHER): Payer: PPO | Admitting: Internal Medicine

## 2020-06-09 ENCOUNTER — Other Ambulatory Visit: Payer: Self-pay

## 2020-06-09 DIAGNOSIS — L97522 Non-pressure chronic ulcer of other part of left foot with fat layer exposed: Secondary | ICD-10-CM | POA: Diagnosis not present

## 2020-06-09 DIAGNOSIS — E11621 Type 2 diabetes mellitus with foot ulcer: Secondary | ICD-10-CM | POA: Diagnosis not present

## 2020-06-09 LAB — GLUCOSE, CAPILLARY: Glucose-Capillary: 181 mg/dL — ABNORMAL HIGH (ref 70–99)

## 2020-06-09 NOTE — Progress Notes (Signed)
DARTANIAN, KNAGGS ([id number]) Visit Report for 06/09/2020 Arrival Information Details Patient Name: Date of Service: Martin [name], Martin [name] 06/09/2020 8:00 A M Medical Record Number: [id number] Patient Account Number: [id number] Date of Birth/Sex: Treating RN: 1945-05-10 (75 y.o. Martin [name] Primary Care Kaiven Vester: [name] Other Clinician: Referring Miasha Emmons: Treating Madeleine Fenn/Extender: [name] in Treatment: 24 Visit Information History Since Last Visit Added or deleted any medications: No Patient Arrived: Ambulatory Any new allergies or adverse reactions: No Arrival Time: 08:03 Had a fall or experienced change in No Accompanied By: self activities of daily living that may affect Transfer Assistance: None risk of falls: Patient Identification Verified: Yes Signs or symptoms of abuse/neglect since last visito No Secondary Verification Process Completed: Yes Hospitalized since last visit: No Patient Requires Transmission-Based Precautions: No Implantable device outside of the clinic excluding No Patient Has Alerts: No cellular tissue based products placed in the center since last visit: Has Dressing in Place as Prescribed: Yes Has Footwear/Offloading in Place as Prescribed: Yes Left: Wedge Shoe Pain Present Now: No Electronic Signature(s) Signed: 06/09/2020 5:46:28 PM By: Deon Pilling Entered By: Deon Pilling on 06/09/2020 08:10:21 -------------------------------------------------------------------------------- Clinic Level of Care Assessment Details Patient Name: Date of Service: Martin [name], Martin [name] 06/09/2020 8:00 A M Medical Record Number: [id number] Patient Account Number: [id number] Date of Birth/Sex: Treating RN: 1945/09/11 (75 y.o. [name] Primary Care Hayven Fatima: [name] Other Clinician: Referring Antwain Caliendo: Treating Zaraya Delauder/Extender: [name] in Treatment: 24 Clinic Level of Care  Assessment Items TOOL 4 Quantity Score []  - 0 Use when only an EandM is performed on FOLLOW-UP visit ASSESSMENTS - Nursing Assessment / Reassessment X- 1 10 Reassessment of Co-morbidities (includes updates in patient status) X- 1 5 Reassessment of Adherence to Treatment Plan ASSESSMENTS - Wound and Skin A ssessment / Reassessment X - Simple Wound Assessment / Reassessment - one wound 1 5 []  - 0 Complex Wound Assessment / Reassessment - multiple wounds []  - 0 Dermatologic / Skin Assessment (not related to wound area) ASSESSMENTS - Focused Assessment X- 1 5 Circumferential Edema Measurements - multi extremities []  - 0 Nutritional Assessment / Counseling / Intervention X- 1 5 Lower Extremity Assessment (monofilament, tuning fork, pulses) []  - 0 Peripheral Arterial Disease Assessment (using hand held doppler) ASSESSMENTS - Ostomy and/or Continence Assessment and Care []  - 0 Incontinence Assessment and Management []  - 0 Ostomy Care Assessment and Management (repouching, etc.) PROCESS - Coordination of Care X - Simple Patient / Family Education for ongoing care 1 15 []  - 0 Complex (extensive) Patient / Family Education for ongoing care X- 1 10 Staff obtains Programmer, systems, Records, T Results / Process Orders est []  - 0 Staff telephones HHA, Nursing Homes / Clarify orders / etc []  - 0 Routine Transfer to another Facility (non-emergent condition) []  - 0 Routine Hospital Admission (non-emergent condition) []  - 0 New Admissions / Biomedical engineer / Ordering NPWT Apligraf, etc. , []  - 0 Emergency Hospital Admission (emergent condition) X- 1 10 Simple Discharge Coordination []  - 0 Complex (extensive) Discharge Coordination PROCESS - Special Needs []  - 0 Pediatric / Minor Patient Management []  - 0 Isolation Patient Management []  - 0 Hearing / Language / Visual special needs []  - 0 Assessment of Community assistance (transportation, D/C planning, etc.) []  -  0 Additional assistance / Altered mentation []  - 0 Support Surface(s) Assessment (bed, cushion, seat, etc.) INTERVENTIONS - Wound Cleansing / Measurement X - Simple Wound Cleansing - one wound 1 5 []  -  0 Complex Wound Cleansing - multiple wounds X- 1 5 Wound Imaging (photographs - any number of wounds) []  - 0 Wound Tracing (instead of photographs) X- 1 5 Simple Wound Measurement - one wound []  - 0 Complex Wound Measurement - multiple wounds INTERVENTIONS - Wound Dressings X - Small Wound Dressing one or multiple wounds 1 10 []  - 0 Medium Wound Dressing one or multiple wounds []  - 0 Large Wound Dressing one or multiple wounds X- 1 5 Application of Medications - topical []  - 0 Application of Medications - injection INTERVENTIONS - Miscellaneous []  - 0 External ear exam []  - 0 Specimen Collection (cultures, biopsies, blood, body fluids, etc.) []  - 0 Specimen(s) / Culture(s) sent or taken to Lab for analysis []  - 0 Patient Transfer (multiple staff / Civil Service fast streamer / Similar devices) []  - 0 Simple Staple / Suture removal (25 or less) []  - 0 Complex Staple / Suture removal (26 or more) []  - 0 Hypo / Hyperglycemic Management (close monitor of Blood Glucose) []  - 0 Ankle / Brachial Index (ABI) - do not check if billed separately X- 1 5 Vital Signs Has the patient been seen at the hospital within the last three years: Yes Total Score: 100 Level Of Care: New/Established - Level 3 Electronic Signature(s) Signed: 06/09/2020 5:49:38 PM By: Baruch Gouty RN, BSN Entered By: Baruch Gouty on 06/09/2020 08:24:38 -------------------------------------------------------------------------------- Encounter Discharge Information Details Patient Name: Date of Service: Martin Agee. 06/09/2020 8:00 A M Medical Record Number: [id number] Patient Account Number: [id number] Date of Birth/Sex: Treating RN: 1945/10/19 (75 y.o. Martin [name] Primary Care Tomer Chalmers: [name] Other  Clinician: Referring Mashelle Busick: Treating Reymond Maynez/Extender: [name] in Treatment: 24 Encounter Discharge Information Items Discharge Condition: Stable Ambulatory Status: Cane Discharge Destination: Home Transportation: Private Auto Accompanied By: self Schedule Follow-up Appointment: Yes Clinical Summary of Care: Electronic Signature(s) Signed: 06/09/2020 5:46:28 PM By: Deon Pilling Entered By: Deon Pilling on 06/09/2020 08:58:08 -------------------------------------------------------------------------------- Lower Extremity Assessment Details Patient Name: Date of Service: Martin [name], Martin [name] 06/09/2020 8:00 A M Medical Record Number: [id number] Patient Account Number: [id number] Date of Birth/Sex: Treating RN: 1945/10/02 (75 y.o. Martin [name] Primary Care Nishka Heide: [name] Other Clinician: Referring Aily Tzeng: Treating Ena Demary/Extender: [name] in Treatment: 24 Edema Assessment Assessed: [name]: Yes] [name]: No] Edema: [Left: Ye] [Right: s] Calf Left: Right: Point of Measurement: 32 cm From Medial Instep 41 cm Ankle Left: Right: Point of Measurement: 11 cm From Medial Instep 30 cm Vascular Assessment Pulses: Dorsalis Pedis Palpable: [Left:Yes] Electronic Signature(s) Signed: 06/09/2020 5:46:28 PM By: Deon Pilling Entered By: Deon Pilling on 06/09/2020 08:12:03 -------------------------------------------------------------------------------- Multi Wound Chart Details Patient Name: Date of Service: Martin Agee. 06/09/2020 8:00 A M Medical Record Number: [id number] Patient Account Number: [id number] Date of Birth/Sex: Treating RN: 11/16/1945 (75 y.o. [name] Primary Care Gracianna Vink: [name] Other Clinician: Referring Lyzbeth Genrich: Treating Dorianna Mckiver/Extender: [name] in Treatment: 24 Vital Signs Height(in): 72 Capillary Blood Glucose(mg/dl):  181 Weight(lbs): 270 Pulse(bpm): 39 Body Mass Index(BMI): 73 Blood Pressure(mmHg): 157/75 Temperature(F): 98.3 Respiratory Rate(breaths/min): 16 Photos: [5:No Photos Left Metatarsal head fifth] [N/A:N/A N/A] Wound Location: [5:Gradually Appeared] [N/A:N/A] Wounding Event: [5:Diabetic Wound/Ulcer of the Lower] [N/A:N/A] Primary Etiology: [5:Extremity Cataracts, Coronary Artery Disease, N/A] Comorbid History: [5:Hypertension, Type II Diabetes, Neuropathy 10/31/2019] [N/A:N/A] Date Acquired: [5:24] [N/A:N/A] Weeks of Treatment: [5:Open] [N/A:N/A] Wound Status: [5:1.2x1x0.6] [N/A:N/A] Measurements L x W x D (cm) [5:0.942] [N/A:N/A] A (cm) : rea [5:0.565] [N/A:N/A] Volume (  cm) : [5:22.10%] [N/A:N/A] % Reduction in A rea: [5:33.30%] [N/A:N/A] % Reduction in Volume: [5:12] Starting Position 1 (o'clock): [5:3] Ending Position 1 (o'clock): [5:0.5] Maximum Distance 1 (cm): [5:Yes] [N/A:N/A] Undermining: [5:Grade 3] [N/A:N/A] Classification: [5:Medium] [N/A:N/A] Exudate A mount: [5:Serosanguineous] [N/A:N/A] Exudate Type: [5:red, brown] [N/A:N/A] Exudate Color: [5:Well defined, not attached] [N/A:N/A] Wound Margin: [5:Large (67-100%)] [N/A:N/A] Granulation A mount: [5:Red, Friable] [N/A:N/A] Granulation Quality: [5:Small (1-33%)] [N/A:N/A] Necrotic A mount: [5:Fat Layer (Subcutaneous Tissue): Yes N/A] Exposed Structures: [5:Fascia: No Tendon: No Muscle: No Joint: No Bone: No None] [N/A:N/A] Treatment Notes Wound #5 (Metatarsal head fifth) Wound Laterality: Left Cleanser Peri-Wound Care Zinc Oxide Ointment 30g tube Discharge Instruction: Apply Zinc Oxide to periwound with each dressing change as needed for maceration Sween Lotion (Moisturizing lotion) Discharge Instruction: Apply moisturizing lotion as directed Topical Primary Dressing KerraCel Ag Gelling Fiber Dressing, 2x2 in (silver alginate) Discharge Instruction: Lightly pack into undermining Secondary Dressing Woven Gauze  Sponge, Non-Sterile 4x4 in Discharge Instruction: Apply over primary dressing as directed. Optifoam Non-Adhesive Dressing, 4x4 in Discharge Instruction: Foam donut Secured With Elastic Bandage 4 inch (ACE bandage) Discharge Instruction: Secure with ACE bandage as directed. Kerlix Roll Sterile, 4.5x3.1 (in/yd) Discharge Instruction: Secure with Kerlix as directed. Compression Wrap Compression Stockings Add-Ons Electronic Signature(s) Signed: 06/09/2020 5:36:32 PM By: Linton Ham MD Signed: 06/09/2020 5:49:38 PM By: Baruch Gouty RN, BSN Entered By: Linton Ham on 06/09/2020 09:11:59 -------------------------------------------------------------------------------- Multi-Disciplinary Care Plan Details Patient Name: Date of Service: Martin Agee. 06/09/2020 8:00 A M Medical Record Number: [id number] Patient Account Number: [id number] Date of Birth/Sex: Treating RN: 1945-05-30 (75 y.o. [name] Primary Care Shara Hartis: [name] Other Clinician: Referring Dainel Arcidiacono: Treating Sydelle Sherfield/Extender: [name] in Treatment: 24 Active Inactive Nutrition Nursing Diagnoses: Impaired glucose control: actual or potential Potential for alteratiion in Nutrition/Potential for imbalanced nutrition Goals: Patient/caregiver will maintain therapeutic glucose control Date Initiated: 12/22/2019 Target Resolution Date: 06/23/2020 Goal Status: Active Interventions: Assess HgA1c results as ordered upon admission and as needed Treatment Activities: Patient referred to Primary Care Physician for further nutritional evaluation : 12/22/2019 Notes: Osteomyelitis Nursing Diagnoses: Infection: osteomyelitis Knowledge deficit related to disease process and management Goals: Patient's osteomyelitis will resolve Date Initiated: 05/05/2020 Target Resolution Date: 06/23/2020 Goal Status: Active Interventions: Assess for signs and symptoms of osteomyelitis  resolution every visit Provide education on osteomyelitis Screen for HBO Treatment Activities: Consult for HBO : 05/05/2020 MRI : 04/17/2020 Systemic antibiotics : 05/05/2020 Notes: Soft Tissue Infection Nursing Diagnoses: Impaired tissue integrity Potential for infection: soft tissue Goals: Patient's soft tissue infection will resolve Date Initiated: 03/29/2020 Target Resolution Date: 06/23/2020 Goal Status: Active Interventions: Assess signs and symptoms of infection every visit Provide education on infection Treatment Activities: Culture and sensitivity : 03/29/2020 Education provided on Infection : 06/02/2020 Systemic antibiotics : 03/29/2020 T ordered outside of clinic : 03/29/2020 est Notes: Wound/Skin Impairment Nursing Diagnoses: Impaired tissue integrity Knowledge deficit related to ulceration/compromised skin integrity Goals: Patient/caregiver will verbalize understanding of skin care regimen Date Initiated: 12/22/2019 Target Resolution Date: 06/23/2020 Goal Status: Active Ulcer/skin breakdown will have a volume reduction of 30% by week 4 Date Initiated: 12/22/2019 Date Inactivated: 01/19/2020 Target Resolution Date: 01/19/2020 Goal Status: Unmet Unmet Reason: infection Ulcer/skin breakdown will have a volume reduction of 50% by week 8 Date Initiated: 01/19/2020 Date Inactivated: 02/16/2020 Target Resolution Date: 02/16/2020 Goal Status: Unmet Unmet Reason: infection Ulcer/skin breakdown will have a volume reduction of 80% by week 12 Date Initiated: 02/16/2020 Date Inactivated: 03/15/2020 Target Resolution Date:  03/15/2020 Goal Status: Unmet Unmet Reason: osteo, getting HBO Interventions: Assess patient/caregiver ability to obtain necessary supplies Assess patient/caregiver ability to perform ulcer/skin care regimen upon admission and as needed Assess ulceration(s) every visit Provide education on ulcer and skin care Treatment Activities: Skin care regimen  initiated : 12/22/2019 Topical wound management initiated : 12/22/2019 Notes: Electronic Signature(s) Signed: 06/09/2020 5:49:38 PM By: Baruch Gouty RN, BSN Entered By: Baruch Gouty on 06/09/2020 08:16:26 -------------------------------------------------------------------------------- Pain Assessment Details Patient Name: Date of Service: Martin Agee. 06/09/2020 8:00 A M Medical Record Number: [id number] Patient Account Number: [id number] Date of Birth/Sex: Treating RN: Jul 16, 1945 (75 y.o. Martin [name] Primary Care Dominique Calvey: [name] Other Clinician: Referring Bartolo Montanye: Treating Eluzer Howdeshell/Extender: [name] in Treatment: 24 Active Problems Location of Pain Severity and Description of Pain Patient Has Paino No Site Locations Rate the pain. Current Pain Level: 0 Pain Management and Medication Current Pain Management: Medication: No Cold Application: No Rest: No Massage: No Activity: No T.E.N.S.: No Heat Application: No Leg drop or elevation: No Is the Current Pain Management Adequate: Adequate How does your wound impact your activities of daily livingo Sleep: No Bathing: No Appetite: No Relationship With Others: No Bladder Continence: No Emotions: No Bowel Continence: No Work: No Toileting: No Drive: No Dressing: No Hobbies: No Electronic Signature(s) Signed: 06/09/2020 5:46:28 PM By: Deon Pilling Entered By: Deon Pilling on 06/09/2020 08:11:27 -------------------------------------------------------------------------------- Patient/Caregiver Education Details Patient Name: Date of Service: Martin [name], [name] 2/11/2022andnbsp8:00 A M Medical Record Number: [id number] Patient Account Number: [id number] Date of Birth/Gender: Treating RN: [birth year] (75 y.o. [name] Primary Care Physician: [name] Other Clinician: Referring Physician: Treating Physician/Extender: [name] in Treatment: 24 Education Assessment Education Provided To: Patient Education Topics Provided Offloading: Methods: Explain/Verbal Responses: Reinforcements needed, State content correctly Wound/Skin Impairment: Methods: Explain/Verbal Responses: Reinforcements needed, State content correctly Electronic Signature(s) Signed: 06/09/2020 5:49:38 PM By: Baruch Gouty RN, BSN Entered By: Baruch Gouty on 06/09/2020 08:17:53 -------------------------------------------------------------------------------- Wound Assessment Details Patient Name: Date of Service: Martin Agee. 06/09/2020 8:00 A M Medical Record Number: [id number] Patient Account Number: [id number] Date of Birth/Sex: Treating RN: Nov 11, 1945 (75 y.o. [name], [name] Primary Care Skarlette Lattner: [name] Other Clinician: Referring Damonie Ellenwood: Treating Vanita Cannell/Extender: [name] in Treatment: 24 Wound Status Wound Number: 5 Primary Diabetic Wound/Ulcer of the Lower Extremity Etiology: Wound Location: Left Metatarsal head fifth Wound Open Wounding Event: Gradually Appeared Status: Date Acquired: 10/31/2019 Comorbid Cataracts, Coronary Artery Disease, Hypertension, Type II Weeks Of Treatment: 24 History: Diabetes, Neuropathy Clustered Wound: No Wound Measurements Length: (cm) 1.2 Width: (cm) 1 Depth: (cm) 0.6 Area: (cm) 0.942 Volume: (cm) 0.565 % Reduction in Area: 22.1% % Reduction in Volume: 33.3% Epithelialization: None Tunneling: No Undermining: Yes Starting Position (o'clock): 12 Ending Position (o'clock): 3 Maximum Distance: (cm) 0.5 Wound Description Classification: Grade 3 Wound Margin: Well defined, not attached Exudate Amount: Medium Exudate Type: Serosanguineous Exudate Color: red, brown Foul Odor After Cleansing: No Slough/Fibrino Yes Wound Bed Granulation Amount: Large (67-100%) Exposed Structure Granulation Quality: Red, Friable Fascia  Exposed: No Necrotic Amount: Small (1-33%) Fat Layer (Subcutaneous Tissue) Exposed: Yes Necrotic Quality: Adherent Slough Tendon Exposed: No Muscle Exposed: No Joint Exposed: No Bone Exposed: No Treatment Notes Wound #5 (Metatarsal head fifth) Wound Laterality: Left Cleanser Peri-Wound Care Zinc Oxide Ointment 30g tube Discharge Instruction: Apply Zinc Oxide to periwound with each dressing change as needed for maceration Sween Lotion (Moisturizing lotion) Discharge Instruction: Apply moisturizing lotion as  directed Topical Primary Dressing KerraCel Ag Gelling Fiber Dressing, 2x2 in (silver alginate) Discharge Instruction: Lightly pack into undermining Secondary Dressing Woven Gauze Sponge, Non-Sterile 4x4 in Discharge Instruction: Apply over primary dressing as directed. Optifoam Non-Adhesive Dressing, 4x4 in Discharge Instruction: Foam donut Secured With Elastic Bandage 4 inch (ACE bandage) Discharge Instruction: Secure with ACE bandage as directed. Kerlix Roll Sterile, 4.5x3.1 (in/yd) Discharge Instruction: Secure with Kerlix as directed. Compression Wrap Compression Stockings Add-Ons Electronic Signature(s) Signed: 06/09/2020 5:46:28 PM By: Deon Pilling Entered By: Deon Pilling on 06/09/2020 08:12:52 -------------------------------------------------------------------------------- Vitals Details Patient Name: Date of Service: Martin Agee. 06/09/2020 8:00 A M Medical Record Number: [id number] Patient Account Number: [id number] Date of Birth/Sex: Treating RN: 06-27-1945 (75 y.o. Martin [name] Primary Care Syla Devoss: [name] Other Clinician: Referring Jones Viviani: Treating Aslee Such/Extender: [name] in Treatment: 24 Vital Signs Time Taken: 08:03 Temperature (F): 98.3 Height (in): 72 Pulse (bpm): 76 Weight (lbs): 270 Respiratory Rate (breaths/min): 16 Body Mass Index (BMI): 36.6 Blood Pressure (mmHg): 157/75 Capillary  Blood Glucose (mg/dl): 181 Reference Range: 80 - 120 mg / dl Electronic Signature(s) Signed: 06/09/2020 5:46:28 PM By: Deon Pilling Entered By: Deon Pilling on 06/09/2020 08:10:44

## 2020-06-09 NOTE — Progress Notes (Signed)
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 this clinic several years ago although have not seen him on this admission. He is a type II diabetic he has had an MRI that shows osteomyelitis. I believe a swab culture showed MRSA he received a course of doxycycline but now has been on linezolid for a week. He says linezolid is causing some mild nausea. But otherwise he is tolerating this well.  He will need a another prescription for this which I will take care of today. We have been using silver alginate on the wound under a total contact cast. The wound is improving both in terms of appearance and surface area. He has some concerns about HBO which she is already approved for predominantly anxiety. He states he has had anxiety since open heart surgery a year ago 02/16/2020 on evaluation today patient appears to be doing better in regard to his foot ulcer in my opinion. Things are actually looking good in this regard. With that being said he does have some side effects from the linezolid. He tells me that he has been somewhat nauseated but mainly when he does not eat with the medication. Evenings are okay the mornings when he tends to eat less  sometimes seem to be worse. With that being said this seems to be something that he can mitigate. With that being said he also has a rash however in the groin area that he tells me about today. He thinks that this may be due to the medication. Subsequently I think that it is due to the medication in a roundabout way and that the patient has what appears to be a yeast infection/tinea cruris which is likely indirectly secondary related to the fact that the patient has been on strong antibiotics for some time now due to the osteomyelitis. However I do not think it is a direct side effect of the medication itself per se. 02/23/2020 upon evaluation the patient appears to be doing decently well in regard to his foot ulcer in fact I am very pleased with how things appear today. He does have less undermining and overall I think between the cast and now the start of the hyperbaric oxygen therapy he has a very good chance of getting this wound to heal. Fortunately there is no signs of active infection at this time which is great news. No fevers, chills, nausea, vomiting, or diarrhea. He does note that he has been having some issues with the linezolid causing him  to be nauseated and he also states that it is caused him to lose his smell and taste. With that being said I am really not certain that this is indeed the reason behind this but either way I think we can switch the antibiotic being that he is looking so well with minimal linezolid for close to 3-4 weeks at this point. We will do 2 weeks of Bactrim and then hopefully he should be complete with the antibiotic courses. 03/01/2020 on evaluation today patient is making good progress in regard to his wound. This is measuring smaller today and overall very pleased. The antibiotics which has seemed to help him he states that he is having a lot of improvement overall in his smell and taste as well as improvement in the overall nausea that he was experiencing with the linezolid. Obviously this is great news. 03/08/2020 upon evaluation today patient appears to be doing well with regard to his foot ulcer. I feel like this is showing signs of improvement it is measuring a little bit better today compared to previous. With that being said there is no evidence of active infection which is great news and in general I feel like the hyperbarics is helping him along with the antibiotics which she will be completing I believe in the next week. Outside of this also feel like that the total contact cast obviously has been of benefit. 03/15/2020 on evaluation today patient appears to be doing well in regard to his foot ulcer. He has been tolerating the dressing changes without complication. Fortunately there is no signs of active infection at this time. No fevers, chills, nausea, vomiting, or diarrhea. Patient is tolerating hyperbarics quite well at this time and I see no signs of infection currently which is great news. 03/22/2020 on evaluation today patient appears to be doing about the same in regard to his foot ulcer. He does have some tissue along the medial portion of the wound bed and I am unsure of exactly what this  is just characteristically. It could be a small amount of tendon here to be honest. With that being said I do think it needs to be removed as I feel like it is hindering his healing possibilities. 11/29; acute  visit; I was asked to see the patient urgently today because of complaints of pain in his foot. He is a wound in the right fifth metatarsal head plantar aspect with underlying osteomyelitis. He has been undergoing hyperbaric oxygen treatment completing antibiotics in the middle of this month. He said the pain had increased to the point that it was becoming difficult for him to walk on his foot. He has not been systemically unwell.. He had a fairly significant debridement on his last visit 5 days ago. 03/29/2020 on evaluation today patient appears to be doing much better than he was on Monday according to the pictures and what I saw. Fortunately there is no signs of active infection at this time which is great news. There is no evidence of systemic infection either. Overall I feel like the patient is doing indeed much better and is just a matter of having him continue the antibiotic for the time being in my opinion and then depending on the results of the culture we may extend that for a bit longer. 12/10; I am seeing the patient today because of difficulty had this week with his wife's illness. He could not come on Wednesday. The wound looks a lot better than what I usually am used to seeing. He is using silver alginate and a total contact cast continuing with HBO. He has completed his antibiotics cultures and x-rays were negative 04/12/2020 upon evaluation today patient unfortunately appears to be doing a little worse in regard to his wound from the standpoint of erythema surrounding. There does not appear to be any signs of active systemic infection which is great news. Nonetheless I am concerned about the fact that if we put him back in the cast he will not be able to visually see what was  going on in regard to his foot and if anything is worsening he would have no idea into it could be potentially too late. 12/17; surrounding his dive yesterday afternoon it was brought to my attention about the extent of swelling in the left leg. I really did not have enough time to fully evaluate this before the end of his treatment so I arranged to have a duplex ultrasound done rule out DVT which thankfully turned out to be negative. I note that he was felt to have cellulitis of the left foot surrounding his wound when he was seen on Wednesday by Jeri Cos and was put on trimethoprim sulfamethoxazole. His report showed no evidence of a DVT or SVT . I am seeing him today in follow-up of this. As it turns out his calf measurements have not really changed that much but he has much more swelling in the ankle. It could be because we did not put a total contact cast back on him. I think he has some degree of chronic venous insufficiency. 12/20; I wanted to look at the patient's leg when he was up on the table before he went into hyperbarics however he was put on the schedule today. He is still taking trimethoprim sulfamethoxazole. Culture that was done last Wednesday is negative. We put him in kerlix Coban wrap over the weekend still a lot of swelling in this leg. DVT rule out was negative there is no swelling in his thigh not sure I really have a good explanation for this. He is not complaining of systemic symptoms 04/19/2020 upon evaluation today patient appears to be doing somewhat poorly in regard to his foot ulcer. Again he has seen Dr. Dellia Nims  Dr. Dellia Nims did order repeat MRI after I agree with this and think that is the right way to go at this point. Depending on the results of the MRI I discussed with the patient today that we may be looking at a referral potentially for surgery consultation if this occurs the patient would like to see someone for second opinion other than Dr. Sharol Given I understand and  I think we can definitely do that I have never discourage patients from second opinions. With that being said currently I do not see any evidence of worsening infection though there still appears to be significant erythema. The patient's culture did show that he is on the appropriate antibiotic with the Bactrim DS which I did prescribe for him last week he has another week of this remaining. I think he can continue to take that for the time being. 12/28; patient comes back in the clinic today with a lot of drainage on the outside of his left dorsal foot. Very significant erythema on the lateral part of the foot and dorsal part of the foot which I have marked with a marker. Also have his MRI to review that showed findings suspicious for progressive osteomyelitis involving the fifth metatarsal head and proximal phalanx no drainable fluid collection. This is obviously worse since his last study in September. He has not been systemically unwell. Says he is not running a fever his blood sugars are stable. 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 For this reason he is completely opposed to any surgery presently. Furthermore his arterial status might not allow foot conserving surgery. I have reviewed his arterial studies from 9/1. This showed an ABI on the left of 0.90 however his TBI was only 0.44 but a toe pressure of 68. Monophasic waveforms. I wonder if he has tibial artery calcification and his ABIs are actually falsely elevated. 1/7; 1 week follow-up. Patient has completed the linezolid I gave him for MRSA cellulitis surrounding the wound in the left foot. This extended into his foot dorsally. This looks a lot better today and he has completed his antibiotics. He did not see infectious disease on Monday because her office was closed due to the weather however he has an appointment on Monday next. I have rediscussed his arterial status and I think the best thing to do would be  to send him to an interventionalists. The patient has a good personal relationship with Dr. Kathlene Cote of interventional radiology and will send him there for either repeat noninvasive studies or consideration of an angiogram. If we were to consider foot conserving surgery [ray amputation] he will need to have the vascular status to heal this. He is certainly not going to consider a below-knee amputation at this point. I am hoping to get him started on oIV antibiotics through infectious disease. 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 1/28; have not seen this patient in a few weeks. He did not see interventional radiology about his arterial status but is scheduled to do so early next week. He also has a follow-up with Dr. Drucilla Schmidt on 2/7 with regards to osteomyelitis. Lab work showed a creatinine of 1.36 and estimated GFR of 51 sedimentation rate of 53 and a CRP of 2.8. Dr. Drucilla Schmidt discussed the issues of amputation. I have told the patient today that certainly all of the surgical options currently may become necessary if the  wound deteriorates or spreads. If his arterial evaluation is satisfactory then he might be able to get away with leg conserving surgery. 2/4; the patient had his CT angiogram. In short this showed posterior tibial arteries is the only blood flow source with single-vessel runoff bilaterally. They did not comment in osteomyelitis in the left fifth met at although they may not specifically of looked at this area. He will see Dr. Drucilla Schmidt on 2/7 for his final suggestion about antibiotics. On 2/16 or 2/18 he is going for an attempt at revascularization in the left foot I think retrograde revascularization. This may give [place] enough blood flow to heal this wound or possibly a ray amputation. The patient is definitive he would not consider a below-knee amputation for this perceivable future because of the need to be a  caregiver for his wife. He is not currently on antibiotic 2/11; patient has his angiogram on 2/18. I'm hopeful that we'll be able to get more blood flow down to the involved area. He has been to see Dr. Drucilla Schmidt of infectious disease. He wants to put off antibiotics until after the interventional radiology attempt at revascularization and make a decision after that. He does have a container of linezolid at home if he starts developing cellulitis. Previous cultures showed a very resistant MRSA. The patient is absolutely opposed to any notion of the BKA. Whether he will have enough blood flow to heal a ray amputation is questionable. I don't disagree with the idea with holding antibiotics until after the angiogram next week and then making decisions after that. I told the patient his role will be to make up his mind about a ray amputation and whether he wants to see somebody [o Dr. Duda] about that. If we are able to improve blood flow to this area I think he needs antibiotics either oral IV I would leave this to infectious disease but the idea of not treating him to me is not the correct decision. We have numerous patients who have closed areas like this and admittedly not all of them maintain closure, but some of them do We will continue to use silver alginate to the wound Objective Constitutional Patient is hypertensive.. Pulse regular and within target range for patient.Marland Kitchen Respirations regular, non-labored and within target range.. Temperature is normal and within the target range for the patient.Marland Kitchen Appears in no distress. Vitals Time Taken: 8:03 AM, Height: 72 in, Weight: 270 lbs, BMI: 36.6, Temperature: 98.3 F, Pulse: 76 bpm, Respiratory Rate: 16 breaths/min, Blood Pressure: 157/75 mmHg, Capillary Blood Glucose: 181 mg/dl. Cardiovascular Needle pulses are not palpable but his foot is not cold. General Notes: Wound exam; this is on the left lateral foot. There is a layer of granulation here  however you can probe easily through this to bone. There is no surrounding soft tissue infection that is obvious. Integumentary (Hair, Skin) Wound #5 status is Open. Original cause of wound was Gradually Appeared. The wound is located on the Left Metatarsal head fifth. The wound measures 1.2cm length x 1cm width x 0.6cm depth; 0.942cm^2 area and 0.565cm^3 volume. There is Fat Layer (Subcutaneous Tissue) exposed. There is no tunneling noted, however, there is undermining starting at 12:00 and ending at 3:00 with a maximum distance of 0.5cm. There is a medium amount of serosanguineous drainage noted. The wound margin is well defined and not attached to the wound base. There is large (67-100%) red, friable granulation within the wound bed. There is a small (1-33%) amount of necrotic  tissue within the wound bed including Adherent Slough. 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 Bathing/ Shower/ Hygiene: May shower with protection but do not get wound dressing(s) wet. Edema Control - Lymphedema / SCD / Other: Elevate legs to the level of the heart or above for 30 minutes daily and/or when sitting, a frequency of: Avoid standing for long periods of time. Patient to wear own compression stockings every day. Moisturize legs daily. Compression stocking or Garment 20-30 mm/Hg pressure to: - both legs daily Off-Loading: Wedge shoe to: - front offloading shoe with felt to offload lateral foot Additional Orders / Instructions: Other: - Go to emergency room for fever, chills, increased redness, increased pain in left foot WOUND #5: - Metatarsal head fifth Wound Laterality:  Left Peri-Wound Care: Zinc Oxide Ointment 30g tube 1 x Per Day/30 Days Discharge Instructions: Apply Zinc Oxide to periwound with each dressing change as needed for maceration Peri-Wound Care: Sween Lotion (Moisturizing lotion) 1 x Per Day/30 Days Discharge Instructions: Apply moisturizing lotion as directed Prim Dressing: KerraCel Ag Gelling Fiber Dressing, 2x2 in (silver alginate) 1 x Per Day/30 Days ary Discharge Instructions: Lightly pack into undermining Secondary Dressing: Woven Gauze Sponge, Non-Sterile 4x4 in 1 x Per Day/30 Days Discharge Instructions: Apply over primary dressing as directed. Secondary Dressing: Optifoam Non-Adhesive Dressing, 4x4 in 1 x Per Day/30 Days Discharge Instructions: Foam donut Secured With: Elastic Bandage 4 inch (ACE bandage) 1 x Per Day/30 Days Discharge Instructions: Secure with ACE bandage as directed. Secured With: [employer], 4.5x3.1 (in/yd) 1 x Per Day/30 Days Discharge Instructions: Secure with Kerlix as directed. 1. I'm continuing to use silver alginate to the wound pending his angiogram next week 2. Hopefully will be able to get better perfusion to this wound area 3. I told the patient his role is to make sure that he would not want to proceed with a ray amputation. He has the other difficulty of being on his feet caregiver for his ailing wife and I think this really unfortunately has a big role in his decision-making 4. If we are able to properly reperfuse him I would favor antibiotics either oral or IV. I could I think get a small piece of bone under the granulation which would probably be representative.. The idea would be attempted closure even if that's only for a less than permanent amount of time. 5. He is not interested in a further course of hyperbaric Electronic Signature(s) Signed: 06/09/2020 5:36:32 PM By: Linton Ham MD Entered By: Linton Ham on 06/09/2020  09:19:51 -------------------------------------------------------------------------------- SuperBill Details Patient Name: Date of Service: Martin [name]. 06/09/2020 Medical Record Number: [id number] Patient Account Number: [id number] Date of Birth/Sex: Treating RN: June 12, 1945 (75 y.o. [name] Primary Care Provider: [name] Other Clinician: Referring Provider: Treating Provider/Extender: [name] in Treatment: 24 Diagnosis Coding ICD-10 Codes Code Description [id number] Type 2 diabetes mellitus with foot ulcer L97.522 Non-pressure chronic ulcer of other part of left foot with fat layer exposed M86.272 Subacute osteomyelitis, left ankle and foot L03.116 Cellulitis of left lower limb E11.51 Type 2 diabetes mellitus with diabetic peripheral angiopathy without gangrene Facility Procedures CPT4 Code: [id number] Description: 99213 - WOUND CARE VISIT-LEV 3 EST PT Modifier: Quantity:  1 Physician Procedures : CPT4 Code Description Modifier [id number] - WC PHYS LEVEL 4 - EST PT ICD-10 Diagnosis Description E11.621 Type 2 diabetes mellitus with foot ulcer M86.272 Subacute osteomyelitis, left ankle and foot L97.522 Non-pressure chronic ulcer of other part  of left foot with fat layer exposed Quantity: 1 Electronic Signature(s) Signed: 06/09/2020 5:36:32 PM By: Linton Ham MD Entered By: Linton Ham on 06/09/2020 09:20:20

## 2020-06-15 ENCOUNTER — Other Ambulatory Visit: Payer: Self-pay | Admitting: Radiology

## 2020-06-15 ENCOUNTER — Other Ambulatory Visit: Payer: Self-pay | Admitting: Student

## 2020-06-15 MED ORDER — DEXTROSE 5 % IV SOLN: 2.0000 g | INTRAVENOUS | Status: AC

## 2020-06-15 NOTE — Consult Note (Signed)
Chief Complaint: Patient was seen in consultation today for left ATA occlusion, left foot ischemia, non-healing wound.  Supervising Physician: Martin [name]  Patient Status: [facility] - Out-pt  History of Present Illness: Martin BURKLAND Sr. is a 75 y.o. male with past medical history of a fib, DM, HTN, CAD s/p CABG, HTN, osteomyelitis of the fifth toe of the left foot now with recurrence of non-healing wound who is referred by Podiatry for intervention for limb salvage in hopes of preventing amputation. He was recently seen in consultation with Dr. Serafina [name] 05/30/20 to discuss endovascular intervention.  After consideration of treatment options he elects to proceed with angiogram with angioplasty vs. Stent placement vs. laseretomy in IR.   Martin [name] presents to [facility] Radiology today in his usual state of health.  He has been NPO.  He does take Plavix as directed.  He reports anxiety regarding procedure today which stems from his experience with open heart surgery years ago.  He otherwise denies fever, chills, nausea, vomiting, abdominal pain, dysuria, changes to his left foot wound including drainage, swelling, or increased erythema.  He is aware of the goals, benefits, and risks of the procedure today and is agreeable to proceed.    Past Medical History:  Diagnosis Date  . A-fib ([place])   . Diabetes mellitus   . Heart attack ([place]) 10/01/2018   Pt had open heart surgery  . Hypertension   . Osteomyelitis of fifth toe of left foot ([place]) 05/08/2020  . 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 Tobacco abuse     Past Surgical History:  Procedure Laterality Date  . CORONARY ARTERY BYPASS GRAFT N/A 10/01/2018   Procedure: CORONARY ARTERY BYPASS GRAFTING (CABG) TIMES  FOUR USING LEFT MAMMARY ARTERY AND RIGHT GREATER SAPHEANOUS VEIN HARVESTED ENDOSCOPICALLY;  Surgeon: Martin Nakayama, [name];  Location: [place];  Service: Open Heart Surgery;  Laterality: N/A;  . HERNIA REPAIR     >10 years ago  . IR  RADIOLOGIST EVAL & MGMT  12/24/2016  . IR RADIOLOGIST EVAL & MGMT  05/30/2020  . IR THORACENTESIS ASP PLEURAL SPACE W/IMG GUIDE  10/06/2018  . LEFT HEART CATH AND CORONARY ANGIOGRAPHY N/A 09/29/2018   Procedure: LEFT HEART CATH AND CORONARY ANGIOGRAPHY;  Surgeon: Martin Sine, [name];  Location: [place] CV LAB;  Service: Cardiovascular;  Laterality: N/A;  . open heart surfery    . right shoulder surgery     around 2014  . TEE WITHOUT CARDIOVERSION N/A 10/01/2018   Procedure: TRANSESOPHAGEAL ECHOCARDIOGRAM (TEE);  Surgeon: Martin Nakayama, [name];  Location: [place];  Service: Open Heart Surgery;  Laterality: N/A;    Allergies: Simvastatin, Statins, and Xanax [alprazolam]  Medications: Prior to Admission medications   Medication Sig Start Date End Date Taking? Authorizing Provider  aspirin 81 MG EC tablet Take 1 tablet (81 mg total) by mouth daily. 02/04/19  Yes Martin Sine, [name]  clopidogrel (PLAVIX) 75 MG tablet Take 1 tablet (75 mg total) by mouth daily. 06/05/20  Yes Martin [name]  ezetimibe (ZETIA) 10 MG tablet TAKE 1 TABLET BY MOUTH EVERY DAY Patient taking differently: Take 10 mg by mouth daily. 05/04/20  Yes Martin Sine, [name]  fexofenadine (ALLEGRA) 180 MG tablet Take 180 mg by mouth daily as needed for allergies or rhinitis.   Yes Provider, Historical, [name]  fluticasone (FLONASE) 50 MCG/ACT nasal spray Place 2 sprays into both nostrils daily as needed for allergies or rhinitis.   Yes Provider, Historical,  [name]  insulin aspart (NOVOLOG FLEXPEN) 100 UNIT/ML FlexPen 3 times a day (just before each meal) 25-20-25 units, and pen needles 4/day Patient taking differently: Inject 20-25 Units into the skin See admin instructions. 3 times a day (just before each meal) 25-20-25 units, and pen needles 4/day 07/13/19  Yes Martin Shin, [name]  insulin glargine (LANTUS SOLOSTAR) 100 UNIT/ML Solostar Pen Inject 10 Units into the skin at bedtime. 07/13/19  Yes Martin Shin, [name]  linezolid (ZYVOX) 600 MG  tablet Take 1 tablet (600 mg total) by mouth 2 (two) times daily. To be taken at first sign of cellulitis around foot Patient taking differently: Take 600 mg by mouth 2 (two) times daily as needed. To be taken at first sign of cellulitis around foot 06/05/20  Yes Martin [name], Martin Islam, [name]  LIVALO 1 MG TABS TAKE 1 TABLET BY MOUTH EVERY DAY Patient taking differently: Take 1 mg by mouth daily. 05/15/20  Yes Marin Olp, [name]  losartan (COZAAR) 25 MG tablet Take 1 tablet (25 mg total) by mouth daily. 05/16/20  Yes Marin Olp, [name]  metoprolol tartrate (LOPRESSOR) 50 MG tablet TAKE 1 TABLET BY MOUTH TWICE A DAY Patient taking differently: Take 50 mg by mouth 2 (two) times daily. 03/21/20  Yes Martin Sine, [name]  torsemide (DEMADEX) 20 MG tablet Take 20 mg by mouth daily.   Yes Provider, Historical, [name]  vitamin C (ASCORBIC ACID) 250 MG tablet Take 250 mg by mouth daily.   Yes Provider, Historical, [name]  Insulin Pen Needle (NOVOFINE) 32G X 6 MM MISC USE 5 TIMES DAILY 07/05/19   Martin Shin, [name]  [facility] VERIO test strip CHECK LEVELS 4 TIMES DAILY 07/30/19   Martin Shin, [name]     Family History  Problem Relation Age of Onset  . Hypertension Mother   . Heart disease Mother        CHF did not see doctor  . Diabetes Maternal Grandmother   . Diabetes Son     Social History   Socioeconomic History  . Marital status: Married    Spouse name: Not on file  . Number of children: Not on file  . Years of education: Not on file  . Highest education level: Not on file  Occupational History  . Not on file  Tobacco Use  . Smoking status: Former Smoker    Packs/day: 0.75    Years: 2.00    Pack years: 1.50    Types: Cigarettes    Start date: 2002    Quit date: 04/29/2002    Years since quitting: 18.1  . Smokeless tobacco: Never Used  . Tobacco comment: smoked for 10 years on and off  Substance and Sexual Activity  . Alcohol use: Yes    Alcohol/week: 0.0 standard drinks    Comment: 6 martinis a  year  . Drug use: No  . Sexual activity: Yes  Other Topics Concern  . Not on file  Social History Narrative   Married. 3 kids. 7 grandkids. No greatgrandkids.       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  All other systems are negative.  Review of Systems  Constitutional: Negative for fatigue and fever.  Respiratory: Negative for cough and shortness of breath.   Cardiovascular: Negative for chest pain.  Gastrointestinal: Negative for abdominal pain, nausea and vomiting.  Genitourinary: Negative for dysuria.  Musculoskeletal: Negative for back pain.  Skin: Positive for wound ( ).  Psychiatric/Behavioral: Negative for behavioral problems and confusion.    Vital Signs: BP (!) 161/80   Pulse 67   Temp 98.1 F (36.7 C) (Oral)   Resp 20   Ht 6\' 1"  (1.854 m)   Wt 260 lb (117.9 kg)   SpO2 96%   BMI 34.30 kg/m   Physical Exam Vitals and nursing note reviewed.  Constitutional:      General: He is not in acute distress.    Appearance: Normal appearance. He is not ill-appearing.  HENT:     Mouth/Throat:     Mouth: Mucous membranes are moist.     Pharynx: Oropharynx is clear.  Cardiovascular:     Rate and Rhythm: Normal rate and regular rhythm.  Pulmonary:     Effort: Pulmonary effort is normal. No respiratory distress.     Breath sounds: Normal breath sounds.  Abdominal:     General: Abdomen is flat. There is no distension.     Palpations: Abdomen is soft.  Skin:    General: Skin is warm and dry.  Neurological:     General: No focal deficit present.     Mental Status:  He is alert and oriented to person, place, and time. Mental status is at baseline.  Psychiatric:        Mood and Affect: Mood normal.        Behavior: Behavior normal.        Thought Content: Thought content normal.        Judgment: Judgment normal.   Wound:      [name] Evaluation Airway: WNL Heart: WNL Abdomen: WNL Chest/ Lungs: WNL ASA  Classification: 3 Mallampati/Airway Score: Two   Imaging: CT ANGIO AO+BIFEM W & OR WO CONTRAST  Result Date: 06/01/2020 CLINICAL DATA:  Claudication/leg ischemia, nonhealing foot ulcer left X 8 months. Hypertension, diabetes EXAM: CT ANGIOGRAPHY OF ABDOMINAL AORTA WITH ILIOFEMORAL RUNOFF TECHNIQUE: Multidetector CT imaging of the abdomen, pelvis and lower extremities was performed using the standard protocol during bolus administration of intravenous contrast. Multiplanar CT image reconstructions and MIPs were obtained to evaluate the vascular anatomy. CONTRAST:  128mL ISOVUE-370 IOPAMIDOL (ISOVUE-370) INJECTION 76% COMPARISON:  None. FINDINGS: VASCULAR Aorta: Mild scattered calcified plaque. No aneurysm, dissection, or stenosis. Celiac: Patent without evidence of aneurysm, dissection, vasculitis or significant stenosis. SMA: Mild ostial plaque without high-grade stenosis, patent distally with classic branch anatomy. Renals: Partially calcified bilateral ostial plaque resulting in least mild short-segment stenosis, vessels patent distally. IMA: Patent without evidence of aneurysm, dissection, vasculitis or significant stenosis. RIGHT Lower Extremity Inflow: Common iliac minimal calcified plaque. Internal iliac minimal plaque. External iliac patent. Outflow: Common femoral patent. Deep femoral branches patent. SFA scattered proximal and mid plaque with mild stenosis; Short-segment stenosis at the adductor hiatus of probable hemodynamic significance. Popliteal artery proximal eccentric plaque, mildly atheromatous but patent across the knee artery Runoff: Anterior  tibial artery high origin, occludes proximal calf. Peroneal artery occludes proximal calf. Posterior tibial contiguous runoff across the ankle. LEFT Lower Extremity Inflow: Scattered calcified plaque 3 ill at the iliac arterial system without significant stenosis. No aneurysm  or dissection. Outflow: Common femoral patent. Deep femoral branches patent. SFA mildly atheromatous throughout with mild stenosis distally. Popliteal atheromatous with mild proximal stenosis, patent across the knee. Runoff: Anterior tibial occludes proximal calf. Peroneal occludes proximal calf. Posterior tibial segmental occlusion in the mid calf, reconstituted and patent across the ankle, primary blood supply to the foot. Veins: No obvious venous abnormality within the limitations of this arterial phase study. Review of the MIP images confirms the above findings. NON-VASCULAR Lower chest: No pleural or pericardial effusion.  Previous CABG. Hepatobiliary: Hyperdensities in the dependent aspect of the gallbladder suggesting small calcified stones. Liver unremarkable. No biliary ductal dilatation. Pancreas: Unremarkable. No pancreatic ductal dilatation or surrounding inflammatory changes. Spleen: Normal in size without focal abnormality. Adrenals/Urinary Tract: Adrenal glands are unremarkable. Kidneys are normal, without renal calculi, focal lesion, or hydronephrosis. Bladder is unremarkable. Stomach/Bowel: Stomach is incompletely distended. Small bowel is decompressed. Normal appendix. The colon is nondilated with scattered sigmoid diverticula; no significant adjacent inflammatory change. Mild fecal distention of the rectum. Lymphatic: No abdominal or pelvic adenopathy. Reproductive: Prostate enlargement. Other: No ascites.  No free air. Musculoskeletal: Degenerative disc disease L5-S1. No acute or significant osseous findings. IMPRESSION: 1. Aortoiliac plaque without stenosis. 2. Short-segment RIGHT SFA stenosis at the adductor hiatus, of  probable hemodynamic significance. 3. Single-vessel right posterior tibial runoff across the ankle. 4. Noncontiguous left tibial runoff with reconstituted posterior tibial as the primary blood supply to the foot. 5. Cholelithiasis. 6. Sigmoid diverticulosis. Aortic Atherosclerosis (ICD10-I70.0). Electronically Signed   By: Lucrezia Europe M.D.   On: 06/01/2020 12:37   US ARTERIAL SEG MULTIPLE LE (ABI, SEGMENTAL PRESSURES, PVR'S)  Result Date: 06/01/2020 CLINICAL DATA:  75 year old male with history of peripheral artery disease and left foot ulcer with associated osteomyelitis. Further evaluation of peripheral artery disease prior to possible intervention. EXAM: NONINVASIVE PHYSIOLOGIC VASCULAR STUDY OF BILATERAL LOWER EXTREMITIES TECHNIQUE: Non-invasive vascular evaluation of both lower extremities was performed at rest, including calculation of ankle-brachial indices, multiple segmental pressure evaluation, segmental Doppler and segmental pulse volume recording. COMPARISON:  12/29/2019 FINDINGS: Right Lower Extremity Resting ABI:  1.18, previously 1.08 Resting TBI: 0.68, previously 0.76 Segmental Pressures: Normal segmental pressures, no significant (20 mmHg) pressure gradient between adjacent segments. Great toe pressure: 109 Arterial Waveforms: Biphasic waveforms throughout. PVRs: Normal PVRs with maintained waveform amplitude, augmentation and quality with exception of dampened waveform in the right great toe. Left Lower Extremity: Resting ABI: 0.85, previously 0.80 Resting TBI: 0.43, previously 0.44 Segmental Pressures: Significant pressure gradient between superficial femoral artery in the popliteal artery from 255-208. Dampened pressures at the level of the ankle compared to the right lower extremity, with significant gradient from the level of the calf to the ankle. Great toe pressure: 69 Arterial Waveforms: Monophasic waveforms throughout the left lower extremity, dampened at the level of the ankle. PVRs:  Dampened PVRs at the level of the ankle and digit. Other: Symmetric upper extremity pressures. Ankle Brachial index > 1.4 Non diagnostic secondary to incompressible vessel calcifications 1.0-1.4       Normal 0.9-0.99     Borderline PAD 0.8-0.89     Mild PAD 0.5-0.79     Moderate PAD < 0.5          Severe PAD Toe Brachial Index Normal     >0.65 Moderate  0.53-0.64 Severe     <0.23 Toe Pressures Absolute toe pressure >43mmHg sufficient for wound healing. Toe pressures <40mmHg = critical limb ischemia. IMPRESSION: 1. Mild left lower extremity peripheral artery  disease (ABI = 0.85), moderate tibial vessel peripheral artery disease (TBI = 0.43). Suggestion of possible flow-limiting stenoses in the distal left SFA and tibial vessels. 2. No significant right lower extremity peripheral artery disease (Right ABI = 1.18). Martin Cancer, [name] Radiology Electronically Signed   By: Martin Cancer [name]   On: 06/01/2020 13:28   IR Radiologist Eval & Mgmt  Result Date: 05/30/2020 Please refer to notes tab for details about interventional procedure. (Op Note)   Labs:  CBC: Recent Labs    07/20/19 1639 02/09/20 1030 05/08/20 1020 06/16/20 0730  WBC 10.0 7.2 7.0 9.1  HGB 11.7* 12.5* 10.8* 10.9*  HCT 37.2* 38.0* 33.2* 35.7*  PLT 291 239 197 212    COAGS: Recent Labs    06/16/20 0730  INR 1.1    BMP: Recent Labs    07/08/19 1146 07/20/19 1639 02/09/20 1030 05/08/20 1020  NA 137 135 138 138  K 5.1 5.0 4.6 5.0  CL 102 96* 102 104  CO2 29 29 27 27   GLUCOSE 167* 191* 161* 196*  BUN 25* 18 24 27*  CALCIUM 9.4 9.1 9.3 9.2  CREATININE 1.13 1.10 1.39* 1.36*  GFRNONAA  --  >60 49* 51*  GFRAA  --  >60 57* 59*    LIVER FUNCTION TESTS: Recent Labs    07/08/19 1146 07/20/19 1639 02/09/20 1030  BILITOT 1.0 1.2 1.9*  AST 16 19 23   ALT 14 19 22   ALKPHOS 55 58  --   PROT 7.8 8.1 7.4  ALBUMIN 4.2 4.4  --     TUMOR MARKERS: No results for  input(s): AFPTM, CEA, CA199, CHROMGRNA in the last 8760 hours.  Assessment and Plan: Patient with past medical history of osteomyelitis, non-healing left 5th toe wound presents with complaint of poor wound healing, left left ischemia.  IR consulted for angioplasty vs stent placement vs laserectomy at the request of Dr. Dellia Nims. Case reviewed by Dr. Serafina [name] who approves patient for procedure.  Patient presents today in their usual state of health.  He has been NPO. He is currently taking Plavix as prescribed. .   Risks and benefits were discussed with the patient including, but not limited to bleeding, infection, vascular injury or contrast induced renal failure.  This interventional procedure involves the use of X-rays and because of the nature of the planned procedure, it is possible that we will have prolonged use of X-ray fluoroscopy.  Potential radiation risks to you include (but are not limited to) the following: - A slightly elevated risk for [name]  several years later in life. This risk is typically less than 0.5% percent. This risk is low in comparison to the normal incidence of human [name], which is 33% for women and 50% for men according to the [place]. - Radiation induced injury can include skin redness, resembling a rash, tissue breakdown / ulcers and hair loss (which can be temporary or permanent).   The likelihood of either of these occurring depends on the difficulty of the procedure and whether you are sensitive to radiation due to previous procedures, disease, or genetic conditions.   IF your procedure requires a prolonged use of radiation, you will be notified and given written instructions for further action.  It is your responsibility to monitor the irradiated area for the 2 weeks following the procedure and to notify your physician if you are concerned that you have suffered a radiation induced injury.    All  of the patient's questions were answered,  patient is agreeable to proceed.  Consent signed and in chart  Thank you for this interesting consult.  I greatly enjoyed [place]. and look forward to participating in their care.  A copy of this report was sent to the requesting provider on this date.  Electronically Signed: Docia Barrier, PA 06/16/2020, 8:32 AM   I spent a total of  30 Minutes   in face to face in clinical consultation, greater than 50% of which was counseling/coordinating care for left ATA occlusion, non-healing wound of the lower extremity.

## 2020-06-16 ENCOUNTER — Encounter (HOSPITAL_COMMUNITY): Payer: Self-pay

## 2020-06-16 ENCOUNTER — Other Ambulatory Visit: Payer: Self-pay

## 2020-06-16 ENCOUNTER — Ambulatory Visit (HOSPITAL_COMMUNITY)
Admission: RE | Admit: 2020-06-16 | Discharge: 2020-06-16 | Disposition: A | Discharge status: Home / Self Care | Payer: PPO | Source: Ambulatory Visit | Attending: Interventional Radiology | Admitting: Interventional Radiology

## 2020-06-16 ENCOUNTER — Other Ambulatory Visit (HOSPITAL_COMMUNITY): Payer: Self-pay | Admitting: Interventional Radiology

## 2020-06-16 DIAGNOSIS — S91105A Unspecified open wound of left lesser toe(s) without damage to nail, initial encounter: Secondary | ICD-10-CM | POA: Diagnosis not present

## 2020-06-16 DIAGNOSIS — I70245 Atherosclerosis of native arteries of left leg with ulceration of other part of foot: Secondary | ICD-10-CM | POA: Insufficient documentation

## 2020-06-16 DIAGNOSIS — E1151 Type 2 diabetes mellitus with diabetic peripheral angiopathy without gangrene: Secondary | ICD-10-CM | POA: Insufficient documentation

## 2020-06-16 DIAGNOSIS — L97529 Non-pressure chronic ulcer of other part of left foot with unspecified severity: Secondary | ICD-10-CM | POA: Diagnosis not present

## 2020-06-16 DIAGNOSIS — Z7982 Long term (current) use of aspirin: Secondary | ICD-10-CM | POA: Diagnosis not present

## 2020-06-16 DIAGNOSIS — I998 Other disorder of circulatory system: Secondary | ICD-10-CM

## 2020-06-16 DIAGNOSIS — Z79899 Other long term (current) drug therapy: Secondary | ICD-10-CM | POA: Diagnosis not present

## 2020-06-16 DIAGNOSIS — S81802A Unspecified open wound, left lower leg, initial encounter: Secondary | ICD-10-CM

## 2020-06-16 DIAGNOSIS — E11621 Type 2 diabetes mellitus with foot ulcer: Secondary | ICD-10-CM | POA: Diagnosis not present

## 2020-06-16 DIAGNOSIS — I4891 Unspecified atrial fibrillation: Secondary | ICD-10-CM | POA: Insufficient documentation

## 2020-06-16 DIAGNOSIS — Z794 Long term (current) use of insulin: Secondary | ICD-10-CM | POA: Insufficient documentation

## 2020-06-16 DIAGNOSIS — I7092 Chronic total occlusion of artery of the extremities: Secondary | ICD-10-CM | POA: Diagnosis not present

## 2020-06-16 DIAGNOSIS — Z87891 Personal history of nicotine dependence: Secondary | ICD-10-CM | POA: Diagnosis not present

## 2020-06-16 DIAGNOSIS — I2510 Atherosclerotic heart disease of native coronary artery without angina pectoris: Secondary | ICD-10-CM | POA: Diagnosis not present

## 2020-06-16 DIAGNOSIS — I771 Stricture of artery: Secondary | ICD-10-CM | POA: Diagnosis not present

## 2020-06-16 DIAGNOSIS — Z951 Presence of aortocoronary bypass graft: Secondary | ICD-10-CM | POA: Insufficient documentation

## 2020-06-16 DIAGNOSIS — Z7902 Long term (current) use of antithrombotics/antiplatelets: Secondary | ICD-10-CM | POA: Diagnosis not present

## 2020-06-16 DIAGNOSIS — I10 Essential (primary) hypertension: Secondary | ICD-10-CM | POA: Insufficient documentation

## 2020-06-16 HISTORY — PX: IR ANGIOGRAM EXTREMITY LEFT: IMG651

## 2020-06-16 HISTORY — PX: IR US GUIDE VASC ACCESS RIGHT: IMG2390

## 2020-06-16 LAB — BASIC METABOLIC PANEL
Anion gap: 10 (ref 5–15)
BUN: 30 mg/dL — ABNORMAL HIGH (ref 8–23)
CO2: 25 mmol/L (ref 22–32)
Calcium: 8.9 mg/dL (ref 8.9–10.3)
Chloride: 101 mmol/L (ref 98–111)
Creatinine, Ser: 1.44 mg/dL — ABNORMAL HIGH (ref 0.61–1.24)
GFR, Estimated: 51 mL/min — ABNORMAL LOW (ref >60)
Glucose, Bld: 208 mg/dL — ABNORMAL HIGH (ref 70–99)
Potassium: 3.9 mmol/L (ref 3.5–5.1)
Sodium: 136 mmol/L (ref 135–145)

## 2020-06-16 LAB — CBC
HCT: 35.7 % — ABNORMAL LOW (ref 39.0–52.0)
Hemoglobin: 10.9 g/dL — ABNORMAL LOW (ref 13.0–17.0)
MCH: 27.3 pg (ref 26.0–34.0)
MCHC: 30.5 g/dL (ref 30.0–36.0)
MCV: 89.5 fL (ref 80.0–100.0)
Platelets: 212 10*3/uL (ref 150–400)
RBC: 3.99 MIL/uL — ABNORMAL LOW (ref 4.22–5.81)
RDW: 16.4 % — ABNORMAL HIGH (ref 11.5–15.5)
WBC: 9.1 10*3/uL (ref 4.0–10.5)
nRBC: 0 % (ref 0.0–0.2)

## 2020-06-16 LAB — PROTIME-INR
INR: 1.1 (ref 0.8–1.2)
Prothrombin Time: 13.5 seconds (ref 11.4–15.2)

## 2020-06-16 MED ORDER — HYDROCODONE-ACETAMINOPHEN 5-325 MG PO TABS: 1.0000 | ORAL_TABLET | ORAL | Status: DC | PRN

## 2020-06-16 MED ORDER — LIDOCAINE HCL 1 % IJ SOLN
INTRAMUSCULAR | Status: AC | PRN
  Administered 2020-06-16: 10 mL

## 2020-06-16 MED ORDER — IODIXANOL 320 MG/ML IV SOLN
100.0000 mL | Freq: Once | INTRAVENOUS | Status: AC | PRN
  Administered 2020-06-16: 50 mL via INTRA_ARTERIAL

## 2020-06-16 MED ORDER — MIDAZOLAM HCL 2 MG/2ML IJ SOLN
INTRAMUSCULAR | Status: AC | PRN
Start: 2020-06-16 — End: 2020-06-16
  Administered 2020-06-16: 1 mg via INTRAVENOUS

## 2020-06-16 MED ORDER — SODIUM CHLORIDE 0.9 % IV SOLN: INTRAVENOUS | Status: DC

## 2020-06-16 MED ORDER — HEPARIN SODIUM (PORCINE) 1000 UNIT/ML IJ SOLN
INTRAMUSCULAR | Status: AC
  Filled 2020-06-16: qty 1

## 2020-06-16 MED ORDER — FENTANYL CITRATE (PF) 100 MCG/2ML IJ SOLN
INTRAMUSCULAR | Status: AC
  Filled 2020-06-16: qty 2

## 2020-06-16 MED ORDER — IOHEXOL 300 MG/ML  SOLN: 150.0000 mL | Freq: Once | INTRAMUSCULAR | Status: DC | PRN

## 2020-06-16 MED ORDER — HEPARIN SODIUM (PORCINE) 1000 UNIT/ML IJ SOLN
INTRAMUSCULAR | Status: AC | PRN
  Administered 2020-06-16: 5000 [IU] via INTRAVENOUS

## 2020-06-16 MED ORDER — FENTANYL CITRATE (PF) 100 MCG/2ML IJ SOLN
INTRAMUSCULAR | Status: AC | PRN
  Administered 2020-06-16 (×3): 12.5 ug via INTRAVENOUS
  Administered 2020-06-16: 50 ug via INTRAVENOUS

## 2020-06-16 MED ORDER — IODIXANOL 320 MG/ML IV SOLN
100.0000 mL | Freq: Once | INTRAVENOUS | Status: AC | PRN
  Administered 2020-06-16: 70 mL via INTRA_ARTERIAL

## 2020-06-16 MED ORDER — LIDOCAINE HCL 1 % IJ SOLN
INTRAMUSCULAR | Status: AC
  Filled 2020-06-16: qty 20

## 2020-06-16 MED ORDER — MIDAZOLAM HCL 5 MG/5ML IJ SOLN
INTRAMUSCULAR | Status: AC | PRN
  Administered 2020-06-16: 0.5 mg via INTRAVENOUS

## 2020-06-16 MED ORDER — MIDAZOLAM HCL 2 MG/2ML IJ SOLN
INTRAMUSCULAR | Status: AC
  Filled 2020-06-16: qty 2

## 2020-06-16 NOTE — Procedures (Signed)
Interventional Radiology Procedure Note  Procedure:  1) Left lower extremity angiogram 2) Laser atherectomy of the popliteal and proximal anterior tibial arteries 3) Balloon angioplasty of the popliteal and proximal anterior tibial arteries 4) Drug coated balloon treatment of popliteal and anterior tibial arteries 5) Completion angiogram  Findings: Please refer to procedural dictation for full description. Right CFA access with 6 Fr sheath, closed with Angioseal.  5,000 U heparin administered intraprocedurally.  Complications: None immediate  Estimated Blood Loss: 20 mL  Recommendations: Flat for 2 hours followed by head of bed up to 30 degrees for 2 hours. Ambulate as tolerated after 4 hours bedrest complete. Advance diet as tolerated after 2 hours flat bedrest. Discharge home once bedrest complete if stable.  Continue aspirin, plavix. Close follow up with Dr. Dellia Nims, wound care. Follow up in IR clinic in 1 month with left lower extremity duplex/segmental pressures/ABIs.   Ruthann Cancer, MD Pager: [phone]

## 2020-06-16 NOTE — Sedation Documentation (Signed)
Pt transported to [facility] via stretcher accompanied by RN. Reviewed right groin site with RN Sharyn Lull, no firmness noted.

## 2020-06-16 NOTE — Discharge Instructions (Signed)
Take dressing off tomorrow afternoon at 4:30pm and shower at that time Keep clean and dry until tomorrow at 4:30pm  Femoral Site Care  This sheet gives you information about how to care for yourself after your procedure. Your health care provider may also give you more specific instructions. If you have problems or questions, contact your health care provider. What can I expect after the procedure? After the procedure, it is common to have:  Bruising that usually fades within 1-2 weeks.  Tenderness at the site. Follow these instructions at home: Wound care  Follow instructions from your health care provider about how to take care of your insertion site. Make sure you: ? Wash your hands with soap and water before you change your bandage (dressing). If soap and water are not available, use hand sanitizer. ? Change your dressing as told by your health care provider. ? Leave stitches (sutures), skin glue, or adhesive strips in place. These skin closures may need to stay in place for 2 weeks or longer. If adhesive strip edges start to loosen and curl up, you may trim the loose edges. Do not remove adhesive strips completely unless your health care provider tells you to do that.  Do not take baths, swim, or use a hot tub until your health care provider approves.  You may shower 24-48 hours after the procedure or as told by your health care provider. ? Gently wash the site with plain soap and water. ? Pat the area dry with a clean towel. ? Do not rub the site. This may cause bleeding.  Do not apply powder or lotion to the site. Keep the site clean and dry.  Check your femoral site every day for signs of infection. Check for: ? Redness, swelling, or pain. ? Fluid or blood. ? Warmth. ? Pus or a bad smell. Activity  For the first 2-3 days after your procedure, or as long as directed: ? Avoid climbing stairs as much as possible. ? Do not squat.  Do not lift anything that is heavier than  10 lb (4.5 kg), or the limit that you are told, until your health care provider says that it is safe.  Rest as directed. ? Avoid sitting for a long time without moving. Get up to take short walks every 1-2 hours.  Do not drive for 24 hours if you were given a medicine to help you relax (sedative). General instructions  Take over-the-counter and prescription medicines only as told by your health care provider.  Keep all follow-up visits as told by your health care provider. This is important. Contact a health care provider if you have:  A fever or chills.  You have redness, swelling, or pain around your insertion site. Get help right away if:  The catheter insertion area swells very fast.  You pass out.  You suddenly start to sweat or your skin gets clammy.  The catheter insertion area is bleeding, and the bleeding does not stop when you hold steady pressure on the area.  The area near or just beyond the catheter insertion site becomes pale, cool, tingly, or numb. These symptoms may represent a serious problem that is an emergency. Do not wait to see if the symptoms will go away. Get medical help right away. Call your local emergency services (911 in the [place] Do not drive yourself to the hospital. Summary  After the procedure, it is common to have bruising that usually fades within 1-2 weeks.  Check your femoral  site every day for signs of infection.  Do not lift anything that is heavier than 10 lb (4.5 kg), or the limit that you are told, until your health care provider says that it is safe. This information is not intended to replace advice given to you by your health care provider. Make sure you discuss any questions you have with your health care provider. Document Revised: 12/17/2019 Document Reviewed: 12/17/2019 Elsevier Patient Education  2021 [place] [employer] This sheet gives you information about how to care for yourself after your procedure.  Your doctor may also give you more specific instructions. If you have problems or questions, contact your doctor. What can I expect after the procedure? After the procedure, it is common to have these problems at the point where the catheter was inserted:  Bruising.  Tenderness.  A collection of blood (hematoma). This may feel like a small lump under the skin at the insertion site. Follow these instructions at home: Insertion site care  Follow instructions from your doctor about how to take care of the area where the catheter was inserted. Make sure you: ? Wash your hands with soap and water before you change your bandage (dressing). If you cannot use soap and water, use hand sanitizer. ? Change your bandage as told by your doctor.  Do not take baths, swim, or use a hot tub until your doctor says it is okay.  You may shower 24-48 hours after the procedure, or as told by your doctor. To clean the area: ? Gently wash the area with plain soap and water. ? Pat the area dry with a clean towel. ? Do not rub the area. This may cause bleeding.  Check your insertion area every day for signs of infection. Check for: ? Redness, swelling, or pain. ? Fluid or blood. ? Warmth. ? Pus or a bad smell.  Do not apply powder or lotion to the area. Keep the area clean and dry.   Activity  Do not drive for 24 hours if you were given a medicine to help you relax (sedative).  Rest as told by your doctor, usually for 1-2 days.  Do not lift anything that is heavier than 10 lbs. (4.5 kg) or as told by your doctor.  If your insertion site was in your leg, try to avoid stairs for a few days.  Return to your normal activities as told by your doctor. Ask your doctor what activities are safe for you. General instructions  If the insertion area starts to bleed, lie flat and put pressure on the area. If the bleeding does not stop, get help right away. This is an emergency.  Take over-the-counter and  prescription medicines only as told by your doctor.  Drink enough fluid to keep your pee (urine) pale yellow.  Keep all follow-up visits as told by your doctor. This is important.   Contact a doctor if:  You have a fever.  You have chills.  You have redness, swelling, or pain around your insertion area.  You have fluid or blood coming from your insertion area.  The insertion area feels warm to the touch.  You have pus or a bad smell coming from your insertion area.  You have more bruising around the insertion area. Get help right away if:  You have a lot of pain in the insertion area.  The insertion area swells very fast.  The insertion area is bleeding, and the bleeding does  not stop after you hold steady pressure on the area.  The area around the insertion area becomes pale, cool, tingly, or numb.  You have chest pain.  You have trouble breathing.  You have a rash.  You have any signs of a stroke. "BE FAST" is an easy way to remember the main warning signs: ? B - Balance. Signs are dizziness, sudden trouble walking, or loss of balance. ? E - Eyes. Signs are trouble seeing or a change in how you see. ? F - Face. Signs are sudden weakness or loss of feeling of the face, or the face or eyelid drooping on one side. ? A - Arms. Signs are weakness or loss of feeling in an arm. This happens suddenly and usually on one side of the body. ? S - Speech. Signs are sudden trouble speaking, slurred speech, or trouble understanding what people say. ? T - Time. Time to call emergency services. Write down what time symptoms started.  You have other signs of a stroke, such as: ? A sudden, very bad headache with no known cause. ? Feeling like you may vomit (nausea). ? Vomiting. ? A seizure. These symptoms may be an emergency. Do not wait to see if the symptoms will go away. Get medical help right away. Call your local emergency services (911 in the [place] Do not drive yourself to the  hospital. Summary  After the procedure, it is common to have bruising and tenderness at the insertion area.  Do not take baths, swim, or use a hot tub until your doctor says it is okay to do so. You may shower 24-48 hours after the procedure or as told by your doctor.  It is important to rest and drink plenty of fluids.  If the insertion area starts to bleed, lie flat and put pressure on the area. If the bleeding does not stop, get help right away. This is an emergency. This information is not intended to replace advice given to you by your health care provider. Make sure you discuss any questions you have with your health care provider. Document Revised: 02/17/2019 Document Reviewed: 02/17/2019 Elsevier Patient Education  [place].

## 2020-06-19 ENCOUNTER — Other Ambulatory Visit: Payer: Self-pay | Admitting: Interventional Radiology

## 2020-06-19 DIAGNOSIS — E08621 Diabetes mellitus due to underlying condition with foot ulcer: Secondary | ICD-10-CM

## 2020-06-20 ENCOUNTER — Telehealth: Payer: Self-pay

## 2020-06-20 ENCOUNTER — Other Ambulatory Visit: Payer: Self-pay | Admitting: Endocrinology

## 2020-06-20 ENCOUNTER — Other Ambulatory Visit: Payer: Self-pay

## 2020-06-20 ENCOUNTER — Telehealth (INDEPENDENT_AMBULATORY_CARE_PROVIDER_SITE_OTHER): Payer: PPO | Admitting: Infectious Disease

## 2020-06-20 DIAGNOSIS — N1832 Chronic kidney disease, stage 3b: Secondary | ICD-10-CM

## 2020-06-20 DIAGNOSIS — J309 Allergic rhinitis, unspecified: Secondary | ICD-10-CM | POA: Diagnosis not present

## 2020-06-20 DIAGNOSIS — A4902 Methicillin resistant Staphylococcus aureus infection, unspecified site: Secondary | ICD-10-CM | POA: Diagnosis not present

## 2020-06-20 DIAGNOSIS — IMO0002 Reserved for concepts with insufficient information to code with codable children: Secondary | ICD-10-CM

## 2020-06-20 DIAGNOSIS — I739 Peripheral vascular disease, unspecified: Secondary | ICD-10-CM | POA: Diagnosis not present

## 2020-06-20 DIAGNOSIS — M869 Osteomyelitis, unspecified: Secondary | ICD-10-CM

## 2020-06-20 DIAGNOSIS — E1139 Type 2 diabetes mellitus with other diabetic ophthalmic complication: Secondary | ICD-10-CM

## 2020-06-20 NOTE — Progress Notes (Signed)
Virtual Visit via Telephone Note  I connected with Martin [name] on 06/20/20 at 10:15 AM EST by telephone and verified that I am speaking with the correct person using two identifiers.  Location: Patient: Home Provider: my Home   I discussed the limitations, risks, security and privacy concerns of performing an evaluation and management service by telephone and the availability of in person appointments. I also discussed with the patient that there may be a patient responsible charge related to this service. The patient expressed understanding and agreed to proceed.   History of Present Illness:  Chief complaint: persistent cough and sinus congestion   Martin [name] is a 75 year old Caucasian man with a past medical history significant for coronary artery disease status post coronary bypass grafting, diabetes mellitus peripheral artery disease who has been struggling with an ulcer in his foot for several years now.   UNFortunately his developed osteomyelitis.  He was referred to Dr. Sharol Given who offered him surgery but the patient was not ready to do this yet.  There is also concern about his ability to heal any surgery in his foot due to his peripheral vascular disease.  He may in fact require a proximal amputation such as a below the knee amputation for cure.  At present he is in a very difficult situation socially and that he is caring for his wife and is the sole caregiver.  His wife has metastatic breast cancer that metastasized to bone and she is currently bedbound.  He is not ready to have any type of surgery including surgery on the foot at this point in time but wants to see his wife improving first over more resolution on not from prior to allowing himself to be hospitalized for a surgical intervention.  He has been followed closely by the wound care clinic and Dr. Dellia Nims who gave him a prescription for Zyvox when the patient developed erythema that went up his leg and  swelling.  He responded well to Zyvox he also had had DVT excluded through duplex.  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  As I last saw him he has continued to stable off antibiotics   In the interim he has had revascularization procedure with IR and has followup with them.  Being followed closely by Dr. Dellia Nims.  Dr. Dellia Nims is very adamant that we give Mykai a trial of systemic IV antibiotics to see if we can improve his condition.  Again I would not think we are going to change cure with this approach but perhaps we can improve the condition of his foot.  In this case Daptomycin better safety profile in terms of not being nephrotoxic.  He has also been complaining of a persistent cough since December.  He says it got better temporarily when he was given amoxicillin by his primary care physician but it has persisted.  He has had trouble tolerating antihistamines including Zyrtec and Allegra both of which made him sleepy.  He is taking Flonase intranasal no sprays and have encouraged him to continue doing that and try another nonsedating antihistamine though some of the nonsedating if his antihistamines do sedate particular individuals.  Is not clear to me that he needs an antimicrobial at this point for the sinuses.  He is frustrated that he  has to be continuously tested for Covid due to his persistent cough and that he continuously tested negative he has been vaccinated and is boosted.  His son recently did contract omicron infection  12 point review of systems as above otherwise negative.   Past Medical History:  Diagnosis Date  . A-fib ([place])   . Diabetes mellitus   . Heart attack ([place]) 10/01/2018   Pt had open heart surgery  . Hypertension   .  Osteomyelitis of fifth toe of left foot ([place]) 05/08/2020  . 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 Tobacco abuse     Past Surgical History:  Procedure Laterality Date  . CORONARY ARTERY BYPASS GRAFT N/A 10/01/2018   Procedure: CORONARY ARTERY BYPASS GRAFTING (CABG) TIMES  FOUR USING LEFT MAMMARY ARTERY AND RIGHT GREATER SAPHEANOUS VEIN HARVESTED ENDOSCOPICALLY;  Surgeon: Melrose Nakayama, MD;  Location: [facility];  Service: Open Heart Surgery;  Laterality: N/A;  . HERNIA REPAIR     >10 years ago  . IR ANGIOGRAM EXTREMITY LEFT  06/16/2020  . IR RADIOLOGIST EVAL & MGMT  12/24/2016  . IR RADIOLOGIST EVAL & MGMT  05/30/2020  . IR THORACENTESIS ASP PLEURAL SPACE W/IMG GUIDE  10/06/2018  . IR US GUIDE VASC ACCESS RIGHT  06/16/2020  . LEFT HEART CATH AND CORONARY ANGIOGRAPHY N/A 09/29/2018   Procedure: LEFT HEART CATH AND CORONARY ANGIOGRAPHY;  Surgeon: Troy Sine, MD;  Location: [facility];  Service: Cardiovascular;  Laterality: N/A;  . open heart surfery    . right shoulder surgery     around 2014  . TEE WITHOUT CARDIOVERSION N/A 10/01/2018   Procedure: TRANSESOPHAGEAL ECHOCARDIOGRAM (TEE);  Surgeon: Melrose Nakayama, MD;  Location: [facility];  Service: Open Heart Surgery;  Laterality: N/A;    Family History  Problem Relation Age of Onset  . Hypertension Mother   . Heart disease Mother        CHF did not see doctor  . Diabetes Maternal Grandmother   . Diabetes Son       Social History   Socioeconomic History  . Marital status: Married    Spouse name: Not on file  . Number of children: Not on file  . Years of education: Not on file  . Highest education level: Not on file  Occupational History  . Not on file  Tobacco Use  . Smoking status: Former Smoker    Packs/day: 0.75    Years: 2.00    Pack years: 1.50    Types: Cigarettes    Start date: 2002    Quit date: 04/29/2002    Years since quitting: 18.1  . Smokeless tobacco: Never Used  . Tobacco comment: smoked for  10 years on and off  Substance and Sexual Activity  . Alcohol use: Yes    Alcohol/week: 0.0 standard drinks    Comment: 6 martinis a year  . Drug use: No  . Sexual activity: Yes  Other Topics Concern  . Not on file  Social History Narrative   Married. 3 kids. 7 grandkids. No greatgrandkids.       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 Simvastatin Diarrhea  . Statins Other (See Comments)    achey joints  . Xanax [Alprazolam] Anxiety     Current Outpatient Medications:  .  aspirin 81 MG EC tablet, Take 1 tablet (81 mg total) by mouth daily., Disp: , Rfl:  .  clopidogrel (PLAVIX) 75 MG tablet, Take 1 tablet (75 mg total) by mouth daily., Disp: 90 tablet, Rfl: 0 .  ezetimibe (ZETIA) 10 MG tablet, TAKE 1 TABLET BY MOUTH EVERY DAY (Patient taking differently: Take 10 mg by mouth daily.), Disp: 90 tablet, Rfl: 0 .  fexofenadine (ALLEGRA) 180 MG tablet, Take 180 mg by mouth daily as needed for allergies or rhinitis., Disp: , Rfl:  .  fluticasone (FLONASE) 50 MCG/ACT nasal spray, Place 2 sprays into both nostrils daily as needed for allergies or rhinitis., Disp: , Rfl:  .  insulin aspart (NOVOLOG FLEXPEN) 100 UNIT/ML FlexPen, 3 times a day (just before each meal) 25-20-25 units, and pen needles 4/day (Patient taking differently: Inject 20-25 Units into the skin See admin instructions. 3 times a day (just before each meal) 25-20-25 units, and pen needles 4/day), Disp: 25 pen, Rfl: 3 .  insulin glargine (LANTUS SOLOSTAR) 100 UNIT/ML Solostar Pen, Inject 10 Units into the skin at bedtime., Disp: 5 pen, Rfl: PRN .  Insulin Pen Needle (NOVOFINE) 32G X 6 MM MISC,  USE 5 TIMES DAILY, Disp: 450 each, Rfl: 2 .  linezolid (ZYVOX) 600 MG tablet, Take 1 tablet (600 mg total) by mouth 2 (two) times daily. To be taken at first sign of cellulitis around foot (Patient taking differently: Take 600 mg by mouth 2 (two) times daily as needed. To be taken at first sign of cellulitis around foot), Disp: 28 tablet, Rfl: 3 .  LIVALO 1 MG TABS, TAKE 1 TABLET BY MOUTH EVERY DAY (Patient taking differently: Take 1 mg by mouth daily.), Disp: 90 tablet, Rfl: 3 .  losartan (COZAAR) 25 MG tablet, Take 1 tablet (25 mg total) by mouth daily., Disp: 90 tablet, Rfl: 1 .  metoprolol tartrate (LOPRESSOR) 50 MG tablet, TAKE 1 TABLET BY MOUTH TWICE A DAY (Patient taking differently: Take 50 mg by mouth 2 (two) times daily.), Disp: 180 tablet, Rfl: 1 .  ONETOUCH VERIO test strip, CHECK LEVELS 4 TIMES DAILY, Disp: 150 strip, Rfl: 1 .  torsemide (DEMADEX) 20 MG tablet, Take 20 mg by mouth daily., Disp: , Rfl:  .  vitamin C (ASCORBIC ACID) 250 MG tablet, Take 250 mg by mouth daily., Disp: , Rfl:     Observations/Objective:  Camrin appeared comfortable on the phone talking to him was in no acute distress  Assessment and Plan:  Osteomyelitis of the foot with peripheral vascular disease: He has had IR intervention.  ID remains skeptical that we can achieve cure with antibiotics but I am certainly happy to give him a course of systemic IV antibiotics.  I will put an order in for a PICC line.  Note his GFR is not optimal but I do not think we do a great deal of damage to just putting a PIC line in 1 arm and he has to go through a great deal of trouble getting PCR tested for Covid due to his persistent cough.  We will plan on using daptomycin at a dose of the 1000 mg daily and aim for 6 to 8 weeks of therapy.  We will check a CBC comprehensive metabolic panel sed rate CRP and CPK weekly.  I have  him scheduled see me back in April.  Persistent cough and sinusitis: This largely seems to be  allergic and I would encourage optimization of this antiallergy medications though he may need antibiotics for potential superimposed bacterial sinus infection.  Follow Up Instructions:    I discussed the assessment and treatment plan with the patient. The patient was provided an opportunity to ask questions and all were answered. The patient agreed with the plan and demonstrated an understanding of the instructions.   The patient was advised to call back or seek an in-person evaluation if the symptoms worsen or if the condition fails to improve as anticipated.  I provided 25 minutes of non-face-to-face time during this encounter.   Alcide Evener, MD

## 2020-06-20 NOTE — Telephone Encounter (Signed)
RN spoke to Martin [name] at [facility] and made her aware of new IV antibiotic orders. RN faxed orders per Dr. Tommy Medal to [facility] with receipt of successful fax transmission.   RN left voicemail for IR to schedule PICC placement. Made Pam aware that patient's first doses will be given in short stay. RN faxed first dose orders to short stay with receipt of successful fax transmission.   [name], RN

## 2020-06-21 NOTE — Telephone Encounter (Signed)
RN spoke with patient to relay PICC placement appointment time of 06/26/20 at 10 am with arrival time of 9:45 am to [facility]. Advised patient that first dose of medication will be given right after PICC placement in short stay. Patient agreeable to appointment time and verbalized understanding.   RN spoke to [place] at [facility] to relay PICC appointment time and to advise her that patient's first dose will be given at short stay. Mary verbalized understanding.  [name], RN

## 2020-06-22 NOTE — Telephone Encounter (Addendum)
Patient's PICC appointment time rescheduled to 06/26/20 at 11am. RN notified patient of change. RN gave new appointment time to [place] at [facility].   Patient asking if [facility] will deliver heparin and saline flushes or if he will have to pick them up. RN advised patient to call Pam at [facility] to verify. Patient verbalized understanding and has no further questions.   [name], RN

## 2020-06-23 ENCOUNTER — Telehealth: Payer: Self-pay

## 2020-06-23 ENCOUNTER — Encounter (HOSPITAL_BASED_OUTPATIENT_CLINIC_OR_DEPARTMENT_OTHER): Payer: PPO | Admitting: Internal Medicine

## 2020-06-23 ENCOUNTER — Other Ambulatory Visit: Payer: Self-pay

## 2020-06-23 ENCOUNTER — Other Ambulatory Visit (HOSPITAL_COMMUNITY): Payer: Self-pay | Admitting: *Deleted

## 2020-06-23 DIAGNOSIS — L97522 Non-pressure chronic ulcer of other part of left foot with fat layer exposed: Secondary | ICD-10-CM | POA: Diagnosis not present

## 2020-06-23 DIAGNOSIS — E11621 Type 2 diabetes mellitus with foot ulcer: Secondary | ICD-10-CM | POA: Diagnosis not present

## 2020-06-23 NOTE — Progress Notes (Signed)
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 contact cast change. He has a wound on the left fifth met head. I did not review this today 02/02/2020 upon evaluation today patient appears to be doing decently well in regard to his wound. He has been tolerating the dressing changes without complication. Fortunately there is no signs of active infection at this time. I do  believe the total contact cast has been beneficial for him over the past week which is great news. He unfortunately has not gotten the medication started as far as the linezolid was concerned as the pharmacy actually got things confused and gave him a prescription for doxycycline I called and canceled previously as he was resistant to the doxycycline. Nonetheless he can start that today which is good news. We are also working on getting the hyperbarics approved for him that something that he would like to consider as well. Again we are in the process of figuring all that out. 10/14; patient I know from his stay in this clinic several years ago although have not seen him on this admission. He is a type II diabetic he has had an MRI that shows osteomyelitis. I believe a swab culture showed MRSA he received a course of doxycycline but now has been on  linezolid for a week. He says linezolid is causing some mild nausea. But otherwise he is tolerating this well. He will need a another prescription for this which I will take care of today. We have been using silver alginate on the wound under a total contact cast. The wound is improving both in terms of appearance and surface area. He has some concerns about HBO which she is already approved for predominantly anxiety. He states he has had anxiety since open heart surgery a year ago 02/16/2020 on evaluation today patient appears to be doing better in regard to his foot ulcer in my opinion. Things are actually looking good in this regard. With that being said he does have some side effects from the linezolid. He tells me that he has been somewhat nauseated but mainly when he does not eat with the medication. Evenings are okay the mornings when he tends to eat less sometimes seem to be worse. With that being said this seems to be something that he can mitigate. With that being said he also has a rash however in the groin area that he tells me about today. He  thinks that this may be due to the medication. Subsequently I think that it is due to the medication in a roundabout way and that the patient has what appears to be a yeast infection/tinea cruris which is likely indirectly secondary related to the fact that the patient has been on strong antibiotics for some time now due to the osteomyelitis. However I do not think it is a direct side effect of the medication itself per se. 02/23/2020 upon evaluation the patient appears to be doing decently well in regard to his foot ulcer in fact I am very pleased with how things appear today. He does have less undermining and overall I think between the cast and now the start of the hyperbaric oxygen therapy he has a very good chance of getting this wound to heal. Fortunately there is no signs of active infection at this time which is great news. No fevers, chills, nausea, vomiting, or diarrhea. He does note that he has been having some issues with the linezolid causing him to be nauseated and he also states that it is caused him to lose his smell and taste. With that being said I am really not certain that this is indeed the reason behind this but either way I think we can switch the antibiotic being that he is looking so well with minimal linezolid for close to 3-4 weeks at this point. We will do 2 weeks of Bactrim and then hopefully he should be complete with the antibiotic courses. 03/01/2020 on evaluation today patient is making good progress in regard to his wound. This is measuring smaller today and overall very pleased. The antibiotics which has seemed to help him he states that he is having a lot of improvement overall in his smell and taste as well as improvement in the overall nausea that he was experiencing with the linezolid. Obviously this is great news. 03/08/2020 upon evaluation today patient appears to be doing well with regard to his foot ulcer. I feel like this is showing signs of improvement it is  measuring a little bit better today compared to previous. With that being said there is no evidence of active infection which is great news and in general I feel like the hyperbarics is helping him along with the antibiotics which she will be completing I believe in the next week. Outside  of this also feel like that the total contact cast obviously has been of benefit. 03/15/2020 on evaluation today patient appears to be doing well in regard to his foot ulcer. He has been tolerating the dressing changes without complication. Fortunately there is no signs of active infection at this time. No fevers, chills, nausea, vomiting, or diarrhea. Patient is tolerating hyperbarics quite well at this time and I see no signs of infection currently which is great news. 03/22/2020 on evaluation today patient appears to be doing about the same in regard to his foot ulcer. He does have some tissue along the medial portion of the wound bed and I am unsure of exactly what this is just characteristically. It could be a small amount of tendon here to be honest. With that being said I do think it needs to be removed as I feel like it is hindering his healing possibilities. 11/29; acute visit; I was asked to see the patient urgently today because of complaints of pain in his foot. He is a wound in the right fifth metatarsal head plantar aspect with underlying osteomyelitis. He has been undergoing hyperbaric oxygen treatment completing antibiotics in the middle of this month. He said the pain had increased to the point that it was becoming difficult for him to walk on his foot. He has not been systemically unwell.. He had a fairly significant debridement on his last visit 5 days ago. 03/29/2020 on evaluation today patient appears to be doing much better than he was on Monday according to the pictures and what I saw. Fortunately there is no signs of active infection at this time which is great news. There is no evidence of  systemic infection either. Overall I feel like the patient is doing indeed much better and is just a matter of having him continue the antibiotic for the time being in my opinion and then depending on the results of the culture we may extend that for a bit longer. 12/10; I am seeing the patient today because of difficulty had this week with his wife's illness. He could not come on Wednesday. The wound looks a lot better than what I usually am used to seeing. He is using silver alginate and a total contact cast continuing with HBO. He has completed his antibiotics cultures and x-rays were negative 04/12/2020 upon evaluation today patient unfortunately appears to be doing a little worse in regard to his wound from the standpoint of erythema surrounding. There does not appear to be any signs of active systemic infection which is great news. Nonetheless I am concerned about the fact that if we put him back in the cast he will not be able to visually see what was going on in regard to his foot and if anything is worsening he would have no idea into it could be potentially too late. 12/17; surrounding his dive yesterday afternoon it was brought to my attention about the extent of swelling in the left leg. I really did not have enough time to fully evaluate this before the end of his treatment so I arranged to have a duplex ultrasound done rule out DVT which thankfully turned out to be negative. I note that he was felt to have cellulitis of the left foot surrounding his wound when he was seen on Wednesday by Jeri Cos and was put on trimethoprim sulfamethoxazole. His report showed no evidence of a DVT or SVT . I am seeing him today in follow-up of this. As it turns  out his calf measurements have not really changed that much but he has much more swelling in the ankle. It could be because we did not put a total contact cast back on him. I think he has some degree of chronic venous insufficiency. 12/20; I  wanted to look at the patient's leg when he was up on the table before he went into hyperbarics however he was put on the schedule today. He is still taking trimethoprim sulfamethoxazole. Culture that was done last Wednesday is negative. We put him in kerlix Coban wrap over the weekend still a lot of swelling in this leg. DVT rule out was negative there is no swelling in his thigh not sure I really have a good explanation for this. He is not complaining of systemic symptoms 04/19/2020 upon evaluation today patient appears to be doing somewhat poorly in regard to his foot ulcer. Again he has seen Dr. Dellia Nims Dr. Dellia Nims did order repeat MRI after I agree with this and think that is the right way to go at this point. Depending on the results of the MRI I discussed with the patient today that we may be looking at a referral potentially for surgery consultation if this occurs the patient would like to see someone for second opinion other than Dr. Sharol Given I understand and I think we can definitely do that I have never discourage patients from second opinions. With that being said currently I do not see any evidence of worsening infection though there still appears to be significant erythema. The patient's culture did show that he is on the appropriate antibiotic with the Bactrim DS which I did prescribe for him last week he has another week of this remaining. I think he can continue to take that for the time being. 12/28; patient comes back in the clinic today with a lot of drainage on the outside of his left dorsal foot. Very significant erythema on the lateral part of the foot and dorsal part of the foot which I have marked with a marker. Also have his MRI to review that showed findings suspicious for progressive osteomyelitis involving the fifth metatarsal head and proximal phalanx no drainable fluid collection. This is obviously worse since his last study in September. He has not been systemically unwell.  Says he is not running a fever his blood sugars are stable. 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 For this reason he is completely opposed to any surgery presently. Furthermore his arterial status might not allow foot conserving surgery. I have reviewed his arterial studies from 9/1. This showed an ABI on the left of 0.90 however his TBI was only 0.44 but a toe pressure of 68. Monophasic waveforms. I wonder if he has tibial artery calcification and his ABIs are actually falsely elevated. 1/7; 1 week follow-up. Patient has completed the linezolid I gave him for MRSA cellulitis surrounding the wound in the left foot. This extended into his foot dorsally. This looks a lot better today and he has completed his antibiotics. He did not see infectious disease on Monday because her office was closed due to the weather however he has an appointment on Monday next. I have rediscussed his arterial status and I think the best thing to do would be to send him to an interventionalists. The patient has a good personal relationship with Dr. Kathlene Cote of interventional radiology and will send him there for either repeat noninvasive studies or consideration of an angiogram. If we  were to consider foot conserving surgery [ray amputation] he will need to have the vascular status to heal this. He is certainly not going to consider a below-knee amputation at this point. I am hoping to get him started on oIV antibiotics through infectious disease. 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 1/28; have not seen this patient in a few weeks. He did not see interventional radiology about his arterial status but is scheduled to do so early next week. He also has a follow-up with Dr. Algis Liming on 2/7 with regards to osteomyelitis. Lab work showed a creatinine of 1.36 and estimated GFR of 51 sedimentation rate of 53 and a CRP of  2.8. Dr. Algis Liming discussed the issues of amputation. I have told the patient today that certainly all of the surgical options currently may become necessary if the wound deteriorates or spreads. If his arterial evaluation is satisfactory then he might be able to get away with leg conserving surgery. 2/4; the patient had his CT angiogram. In short this showed posterior tibial arteries is the only blood flow source with single-vessel runoff bilaterally. They did not comment in osteomyelitis in the left fifth met at although they may not specifically of looked at this area. He will see Dr. Algis Liming on 2/7 for his final suggestion about antibiotics. On 2/16 or 2/18 he is going for an attempt at revascularization in the left foot I think retrograde revascularization. This may give [place] enough blood flow to heal this wound or possibly a ray amputation. The patient is definitive he would not consider a below-knee amputation for this perceivable future because of the need to be a caregiver for his wife. He is not currently on antibiotic 2/11; patient has his angiogram on 2/18. I'm hopeful that we'll be able to get more blood flow down to the involved area. He has been to see Dr. Algis Liming of infectious disease. He wants to put off antibiotics until after the interventional radiology attempt at revascularization and make a decision after that. He does have a container of linezolid at home if he starts developing cellulitis. Previous cultures showed a very resistant MRSA. The patient is absolutely opposed to any notion of the BKA. Whether he will have enough blood flow to heal a ray amputation is questionable. I don't disagree with the idea with holding antibiotics until after the angiogram next week and then making decisions after that. I told the patient his role will be to make up his mind about a ray amputation and whether he wants to see somebody [o Dr. Duda] about that. If we are able to improve blood flow to this  area I think he needs antibiotics either oral IV I would leave this to infectious disease but the idea of not treating him to me is not the correct decision. We have numerous patients who have closed areas like this and admittedly not all of them maintain closure, but some of them do We will continue to use silver alginate to the wound 2/25 the patient underwent a extensive revascularization by Dr. Elby Showers of interventional radiology. We certainly appreciate his involvement. He underwent a laser atherectomy of the left popliteal and proximal left anterior tibial arteries followed by balloon angioplasty. He was felt to have improved patency and anterograde flow via the left popliteal and left proximal anterior tibial arteries. It was also felt that his collaterals mostly supplied by the proximal anterior  tibial artery were improved notable that the posterior tibial artery is patent. The peroneal artery was diminutive in the dorsalis pedis is diminutive. It was mentioned to the patient that possible retrograde approach revascularization might be in order. He is already booked for noninvasive studies in mid March. The patient has also been to see Dr. Drucilla Schmidt with regards to the underlying MRSA infection. Dr. Drucilla Schmidt has graciously agreed to set up IV daptomycin for the patient and he will get his PICC line on Monday. Is in the meantime his wound looks better using silver alginate. The patient is grateful to everybody involved in an attempt to get this wound to close Objective Constitutional Vitals Time Taken: 8:02 AM, Height: 72 in, Weight: 270 lbs, BMI: 36.6, Temperature: 98.2 F, Pulse: 74 bpm, Respiratory Rate: 17 breaths/min, Blood Pressure: 183/79 mmHg, Capillary Blood Glucose: 151 mg/dl. Cardiovascular Posterior tibial pulses palpable but reduced dorsalis pedis also reduced. General Notes: Wound exam; left lateral foot. The surface of this looks healthy although with probing with a skinny the  tissue was not firm and does have a friable field to it. HOWEVER this does not go to bone this week there is no surrounding erythema. Integumentary (Hair, Skin) Wound #5 status is Open. Original cause of wound was Gradually Appeared. The date acquired was: 10/31/2019. The wound has been in treatment 26 weeks. The wound is located on the Left Metatarsal head fifth. The wound measures 0.7cm length x 0.7cm width x 0.5cm depth; 0.385cm^2 area and 0.192cm^3 volume. There is Fat Layer (Subcutaneous Tissue) exposed. There is no tunneling noted, however, there is undermining starting at 12:00 and ending at 12:00 with a maximum distance of 0.2cm. There is a medium amount of serosanguineous drainage noted. The wound margin is well defined and not attached to the wound base. There is large (67-100%) red, friable granulation within the wound bed. There is a small (1-33%) amount of necrotic tissue within the wound bed including Adherent Slough. 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 Bathing/ Shower/ Hygiene: May shower with protection but do not get wound dressing(s) wet. Edema Control - Lymphedema / SCD / Other: Elevate legs to the level of the heart or above for 30 minutes daily and/or when sitting, a frequency of: Avoid standing for long periods of time. Patient to wear own compression stockings every day. Moisturize legs daily. Compression stocking or Garment 20-30 mm/Hg pressure to: - both legs daily Off-Loading: Wedge shoe to: - front offloading shoe with felt to offload lateral foot Additional Orders / Instructions: Other: - Go to emergency room for fever, chills, increased  redness, increased pain in left foot WOUND #5: - Metatarsal head fifth Wound Laterality: Left Peri-Wound Care: Zinc Oxide Ointment 30g tube 1 x Per Day/30 Days Discharge Instructions: Apply Zinc Oxide to periwound with each dressing change as needed for maceration Peri-Wound Care: Sween Lotion (Moisturizing lotion) 1 x Per Day/30 Days Discharge Instructions: Apply moisturizing lotion as directed Prim Dressing: KerraCel Ag Gelling Fiber Dressing, 2x2 in (silver alginate) 1 x Per Day/30 Days ary Discharge Instructions: Lightly pack into undermining Secondary Dressing: Woven Gauze Sponge, Non-Sterile 4x4 in 1 x Per Day/30 Days Discharge Instructions: Apply over primary dressing as directed. Secondary Dressing: Optifoam Non-Adhesive Dressing,  4x4 in 1 x Per Day/30 Days Discharge Instructions: Foam donut Secured With: Elastic Bandage 4 inch (ACE bandage) 1 x Per Day/30 Days Discharge Instructions: Secure with ACE bandage as directed. Secured With: [employer], 4.5x3.1 (in/yd) 1 x Per Day/30 Days Discharge Instructions: Secure with Kerlix as directed. 1. The patient has been revascularized including atherectomy and angioplasty of the left popliteal and proximal left anterior tibial arteries. 2. An aggressive attempt to do our best to eradicate the MRSA bone infection with daptomycin. 3. I explained to the patient that with regards to situations like is a never used the word "cure" until his wound has been closed and he is asymptomatic for over a year. Even then I use it with some trepidation. Nevertheless I believe for many patients this is the correct approach. I doubt he would have sufficient blood flow for a ray amputation and even if it did it would hamper his ability to care for his wife. This has been what has been important to this man 4. Follow-up in 3 weeks Electronic Signature(s) Signed: 06/23/2020 4:32:00 PM By: Linton Ham MD Entered By: Linton Ham on 06/23/2020  09:09:43 -------------------------------------------------------------------------------- SuperBill Details Patient Name: Date of Service: CLEAVEN, DEMARIO 06/23/2020 Medical Record Number: [id number] Patient Account Number: [id number] Date of Birth/Sex: Treating RN: 1945-10-26 (75 y.o. [name] Primary Care Provider: [name] Other Clinician: Referring Provider: Treating Provider/Extender: [name] in Treatment: 26 Diagnosis Coding ICD-10 Codes Code Description E11.621 Type 2 diabetes mellitus with foot ulcer L97.522 Non-pressure chronic ulcer of other part of left foot with fat layer exposed M86.272 Subacute osteomyelitis, left ankle and foot L03.116 Cellulitis of left lower limb E11.51 Type 2 diabetes mellitus with diabetic peripheral angiopathy without gangrene Facility Procedures CPT4 Code: [id number] Description: 99213 - WOUND CARE VISIT-LEV 3 EST PT Modifier: Quantity: 1 Physician Procedures : CPT4 Code Description Modifier [id number] - WC PHYS LEVEL 4 - EST PT ICD-10 Diagnosis Description E11.621 Type 2 diabetes mellitus with foot ulcer L97.522 Non-pressure chronic ulcer of other part of left foot with fat layer exposed M86.272 Subacute  osteomyelitis, left ankle and foot Quantity: 1 Electronic Signature(s) Signed: 06/23/2020 4:32:00 PM By: Linton Ham MD Entered By: Linton Ham on 06/23/2020 09:10:09

## 2020-06-23 NOTE — Telephone Encounter (Signed)
Received call from [facility] at [place], she states the patient's daily cost for daptomycin will be $130.45 per day. They will call patient and go over cost with him to see if he is agreeable and they will update [place] Monday.   [name], RN

## 2020-06-23 NOTE — Progress Notes (Signed)
JEYREN, DANOWSKI ([id number]) Visit Report for 06/23/2020 Arrival Information Details Patient Name: Date of Service: ALLISTER, LESSLEY 06/23/2020 8:00 A M Medical Record Number: [id number] Patient Account Number: [id number] Date of Birth/Sex: Treating RN: March 06, 1946 (75 y.o. [name], [name] Primary Care Provider: [name] Other Clinician: Referring Provider: Treating Provider/Extender: [name] in Treatment: 41 Visit Information History Since Last Visit Added or deleted any medications: No Patient Arrived: [name] Any new allergies or adverse reactions: No Arrival Time: 08:01 Had a fall or experienced change in No Accompanied By: self activities of daily living that may affect Transfer Assistance: None risk of falls: Patient Identification Verified: Yes Signs or symptoms of abuse/neglect since last visito No Secondary Verification Process Completed: Yes Hospitalized since last visit: No Patient Requires Transmission-Based Precautions: No Implantable device outside of the clinic excluding No Patient Has Alerts: No cellular tissue based products placed in the center since last visit: Has Dressing in Place as Prescribed: Yes Pain Present Now: No Electronic Signature(s) Signed: 06/23/2020 5:08:13 PM By: Rhae Hammock RN Entered By: Rhae Hammock on 06/23/2020 08:01:37 -------------------------------------------------------------------------------- Clinic Level of Care Assessment Details Patient Name: Date of Service: CRISTIN, SZATKOWSKI 06/23/2020 8:00 A M Medical Record Number: [id number] Patient Account Number: [id number] Date of Birth/Sex: Treating RN: 07-29-1945 (75 y.o. [name] Primary Care Provider: [name] Other Clinician: Referring Provider: Treating Provider/Extender: [name] in Treatment: 26 Clinic Level of Care Assessment Items TOOL 4 Quantity Score []  - 0 Use when only an  EandM is performed on FOLLOW-UP visit ASSESSMENTS - Nursing Assessment / Reassessment X- 1 10 Reassessment of Co-morbidities (includes updates in patient status) X- 1 5 Reassessment of Adherence to Treatment Plan ASSESSMENTS - Wound and Skin A ssessment / Reassessment X - Simple Wound Assessment / Reassessment - one wound 1 5 []  - 0 Complex Wound Assessment / Reassessment - multiple wounds []  - 0 Dermatologic / Skin Assessment (not related to wound area) ASSESSMENTS - Focused Assessment X- 1 5 Circumferential Edema Measurements - multi extremities []  - 0 Nutritional Assessment / Counseling / Intervention X- 1 5 Lower Extremity Assessment (monofilament, tuning fork, pulses) []  - 0 Peripheral Arterial Disease Assessment (using hand held doppler) ASSESSMENTS - Ostomy and/or Continence Assessment and Care []  - 0 Incontinence Assessment and Management []  - 0 Ostomy Care Assessment and Management (repouching, etc.) PROCESS - Coordination of Care X - Simple Patient / Family Education for ongoing care 1 15 []  - 0 Complex (extensive) Patient / Family Education for ongoing care X- 1 10 Staff obtains Programmer, systems, Records, T Results / Process Orders est []  - 0 Staff telephones HHA, Nursing Homes / Clarify orders / etc []  - 0 Routine Transfer to another Facility (non-emergent condition) []  - 0 Routine Hospital Admission (non-emergent condition) []  - 0 New Admissions / Biomedical engineer / Ordering NPWT Apligraf, etc. , []  - 0 Emergency Hospital Admission (emergent condition) X- 1 10 Simple Discharge Coordination []  - 0 Complex (extensive) Discharge Coordination PROCESS - Special Needs []  - 0 Pediatric / Minor Patient Management []  - 0 Isolation Patient Management []  - 0 Hearing / Language / Visual special needs []  - 0 Assessment of Community assistance (transportation, D/C planning, etc.) []  - 0 Additional assistance / Altered mentation []  - 0 Support Surface(s)  Assessment (bed, cushion, seat, etc.) INTERVENTIONS - Wound Cleansing / Measurement X - Simple Wound Cleansing - one wound 1 5 []  - 0 Complex Wound Cleansing - multiple wounds X-  1 5 Wound Imaging (photographs - any number of wounds) []  - 0 Wound Tracing (instead of photographs) X- 1 5 Simple Wound Measurement - one wound []  - 0 Complex Wound Measurement - multiple wounds INTERVENTIONS - Wound Dressings X - Small Wound Dressing one or multiple wounds 1 10 []  - 0 Medium Wound Dressing one or multiple wounds []  - 0 Large Wound Dressing one or multiple wounds X- 1 5 Application of Medications - topical []  - 0 Application of Medications - injection INTERVENTIONS - Miscellaneous []  - 0 External ear exam []  - 0 Specimen Collection (cultures, biopsies, blood, body fluids, etc.) []  - 0 Specimen(s) / Culture(s) sent or taken to Lab for analysis []  - 0 Patient Transfer (multiple staff / Civil Service fast streamer / Similar devices) []  - 0 Simple Staple / Suture removal (25 or less) []  - 0 Complex Staple / Suture removal (26 or more) []  - 0 Hypo / Hyperglycemic Management (close monitor of Blood Glucose) []  - 0 Ankle / Brachial Index (ABI) - do not check if billed separately X- 1 5 Vital Signs Has the patient been seen at the hospital within the last three years: Yes Total Score: 100 Level Of Care: New/Established - Level 3 Electronic Signature(s) Signed: 06/23/2020 4:54:48 PM By: Baruch Gouty RN, BSN Entered By: Baruch Gouty on 06/23/2020 08:23:18 -------------------------------------------------------------------------------- Encounter Discharge Information Details Patient Name: Date of Service: [name] 06/23/2020 8:00 A M Medical Record Number: [id number] Patient Account Number: [id number] Date of Birth/Sex: Treating RN: November 28, 1945 (75 y.o. [name] Primary Care Provider: [name] Other Clinician: Referring Provider: Treating Provider/Extender: [name] in Treatment: 26 Encounter Discharge Information Items Discharge Condition: Stable Ambulatory Status: Cane Discharge Destination: Home Transportation: Private Auto Accompanied By: self Schedule Follow-up Appointment: Yes Clinical Summary of Care: Electronic Signature(s) Signed: 06/23/2020 5:07:55 PM By: Deon Pilling Entered By: Deon Pilling on 06/23/2020 08:46:03 -------------------------------------------------------------------------------- Lower Extremity Assessment Details Patient Name: Date of Service: KYMERE, FULLINGTON 06/23/2020 8:00 A M Medical Record Number: [id number] Patient Account Number: [id number] Date of Birth/Sex: Treating RN: March 10, 1946 (75 y.o. [name], [name] Primary Care Provider: [name] Other Clinician: Referring Provider: Treating Provider/Extender: [name] in Treatment: 26 Edema Assessment Assessed: [name]: No] [name]: No] Edema: [Left: Ye] [Right: s] Calf Left: Right: Point of Measurement: 32 cm From Medial Instep 40 cm Ankle Left: Right: Point of Measurement: 11 cm From Medial Instep 27 cm Vascular Assessment Pulses: Dorsalis Pedis Palpable: [Left:Yes] Posterior Tibial Palpable: [Left:Yes] Electronic Signature(s) Signed: 06/23/2020 5:08:13 PM By: Rhae Hammock RN Entered By: Rhae Hammock on 06/23/2020 08:04:44 -------------------------------------------------------------------------------- Multi Wound Chart Details Patient Name: Date of Service: [name] 06/23/2020 8:00 A M Medical Record Number: [id number] Patient Account Number: [id number] Date of Birth/Sex: Treating RN: 03-Nov-1945 (75 y.o. [name] Primary Care Provider: [name] Other Clinician: Referring Provider: Treating Provider/Extender: [name] in Treatment: 26 Vital Signs Height(in): 72 Capillary Blood Glucose(mg/dl): 151 Weight(lbs):  270 Pulse(bpm): 31 Body Mass Index(BMI): 50 Blood Pressure(mmHg): 183/79 Temperature(F): 98.2 Respiratory Rate(breaths/min): 17 Photos: [5:No Photos Left Metatarsal head fifth] [N/A:N/A N/A] Wound Location: [5:Gradually Appeared] [N/A:N/A] Wounding Event: [5:Diabetic Wound/Ulcer of the Lower] [N/A:N/A] Primary Etiology: [5:Extremity Cataracts, Coronary Artery Disease, N/A] Comorbid History: [5:Hypertension, Type II Diabetes, Neuropathy 10/31/2019] [N/A:N/A] Date Acquired: [5:26] [N/A:N/A] Weeks of Treatment: [5:Open] [N/A:N/A] Wound Status: [5:0.7x0.7x0.5] [N/A:N/A] Measurements L x W x D (cm) [5:0.385] [N/A:N/A] A (cm) : rea [5:0.192] [N/A:N/A] Volume (cm) : [5:68.20%] [  N/A:N/A] % Reduction in A rea: [5:77.30%] [N/A:N/A] % Reduction in Volume: [5:12] Starting Position 1 (o'clock): [5:12] Ending Position 1 (o'clock): [5:0.2] Maximum Distance 1 (cm): [5:Yes] [N/A:N/A] Undermining: [5:Grade 3] [N/A:N/A] Classification: [5:Medium] [N/A:N/A] Exudate A mount: [5:Serosanguineous] [N/A:N/A] Exudate Type: [5:red, brown] [N/A:N/A] Exudate Color: [5:Well defined, not attached] [N/A:N/A] Wound Margin: [5:Large (67-100%)] [N/A:N/A] Granulation A mount: [5:Red, Friable] [N/A:N/A] Granulation Quality: [5:Small (1-33%)] [N/A:N/A] Necrotic A mount: [5:Fat Layer (Subcutaneous Tissue): Yes N/A] Exposed Structures: [5:Fascia: No Tendon: No Muscle: No Joint: No Bone: No None] [N/A:N/A] Treatment Notes Wound #5 (Metatarsal head fifth) Wound Laterality: Left Cleanser Peri-Wound Care Zinc Oxide Ointment 30g tube Discharge Instruction: Apply Zinc Oxide to periwound with each dressing change as needed for maceration Sween Lotion (Moisturizing lotion) Discharge Instruction: Apply moisturizing lotion as directed Topical Primary Dressing KerraCel Ag Gelling Fiber Dressing, 2x2 in (silver alginate) Discharge Instruction: Lightly pack into undermining Secondary Dressing Woven Gauze Sponge,  Non-Sterile 4x4 in Discharge Instruction: Apply over primary dressing as directed. Optifoam Non-Adhesive Dressing, 4x4 in Discharge Instruction: Foam donut Secured With Elastic Bandage 4 inch (ACE bandage) Discharge Instruction: Secure with ACE bandage as directed. Kerlix Roll Sterile, 4.5x3.1 (in/yd) Discharge Instruction: Secure with Kerlix as directed. Compression Wrap Compression Stockings Add-Ons Electronic Signature(s) Signed: 06/23/2020 4:32:00 PM By: Linton Ham MD Signed: 06/23/2020 4:54:48 PM By: Baruch Gouty RN, BSN Entered By: Linton Ham on 06/23/2020 08:59:41 -------------------------------------------------------------------------------- Multi-Disciplinary Care Plan Details Patient Name: Date of Service: [name] 06/23/2020 8:00 A M Medical Record Number: [id number] Patient Account Number: [id number] Date of Birth/Sex: Treating RN: 1946-04-15 (75 y.o. [name] Primary Care Provider: [name] Other Clinician: Referring Provider: Treating Provider/Extender: [name] in Treatment: [place] reviewed with physician Active Inactive Nutrition Nursing Diagnoses: Impaired glucose control: actual or potential Potential for alteratiion in Nutrition/Potential for imbalanced nutrition Goals: Patient/caregiver will maintain therapeutic glucose control Date Initiated: 12/22/2019 Target Resolution Date: 07/21/2020 Goal Status: Active Interventions: Assess HgA1c results as ordered upon admission and as needed Treatment Activities: Patient referred to Primary Care Physician for further nutritional evaluation : 12/22/2019 Notes: Osteomyelitis Nursing Diagnoses: Infection: osteomyelitis Knowledge deficit related to disease process and management Goals: Patient's osteomyelitis will resolve Date Initiated: 05/05/2020 Target Resolution Date: 07/21/2020 Goal Status: Active Interventions: Assess  for signs and symptoms of osteomyelitis resolution every visit Provide education on osteomyelitis Screen for HBO Treatment Activities: Consult for HBO : 05/05/2020 MRI : 04/17/2020 Systemic antibiotics : 05/05/2020 Notes: Wound/Skin Impairment Nursing Diagnoses: Impaired tissue integrity Knowledge deficit related to ulceration/compromised skin integrity Goals: Patient/caregiver will verbalize understanding of skin care regimen Date Initiated: 12/22/2019 Target Resolution Date: 07/21/2020 Goal Status: Active Ulcer/skin breakdown will have a volume reduction of 30% by week 4 Date Initiated: 12/22/2019 Date Inactivated: 01/19/2020 Target Resolution Date: 01/19/2020 Goal Status: Unmet Unmet Reason: infection Ulcer/skin breakdown will have a volume reduction of 50% by week 8 Date Initiated: 01/19/2020 Date Inactivated: 02/16/2020 Target Resolution Date: 02/16/2020 Goal Status: Unmet Unmet Reason: infection Ulcer/skin breakdown will have a volume reduction of 80% by week 12 Date Initiated: 02/16/2020 Date Inactivated: 03/15/2020 Target Resolution Date: 03/15/2020 Goal Status: Unmet Unmet Reason: osteo, getting HBO Interventions: Assess patient/caregiver ability to obtain necessary supplies Assess patient/caregiver ability to perform ulcer/skin care regimen upon admission and as needed Assess ulceration(s) every visit Provide education on ulcer and skin care Treatment Activities: Skin care regimen initiated : 12/22/2019 Topical wound management initiated : 12/22/2019 Notes: Electronic Signature(s) Signed: 06/23/2020 4:54:48 PM By: Baruch Gouty RN,  BSN Entered By: Baruch Gouty on 06/23/2020 08:00:36 -------------------------------------------------------------------------------- Pain Assessment Details Patient Name: Date of Service: LOVELL, NUTTALL 06/23/2020 8:00 A M Medical Record Number: [id number] Patient Account Number: [id number] Date of Birth/Sex: Treating RN: 11/20/1945  (75 y.o. [name], [name] Primary Care Provider: [name] Other Clinician: Referring Provider: Treating Provider/Extender: [name] in Treatment: 26 Active Problems Location of Pain Severity and Description of Pain Patient Has Paino No Site Locations Pain Management and Medication Current Pain Management: Electronic Signature(s) Signed: 06/23/2020 5:08:13 PM By: Rhae Hammock RN Entered By: Rhae Hammock on 06/23/2020 08:04:07 -------------------------------------------------------------------------------- Patient/Caregiver Education Details Patient Name: Date of Service: Fazzini, Rishab W. 2/25/2022andnbsp8:00 [place] Record Number: [id number] Patient Account Number: [id number] Date of Birth/Gender: Treating RN: 1945/11/13 (75 y.o. [name] Primary Care Physician: [name] Other Clinician: Referring Physician: Treating Physician/Extender: [name] in Treatment: 26 Education Assessment Education Provided To: Patient Education Topics Provided Infection: Methods: Explain/Verbal Responses: Reinforcements needed, State content correctly Offloading: Methods: Explain/Verbal Responses: Reinforcements needed, State content correctly Wound/Skin Impairment: Methods: Explain/Verbal Responses: Reinforcements needed, State content correctly Electronic Signature(s) Signed: 06/23/2020 4:54:48 PM By: Baruch Gouty RN, BSN Signed: 06/23/2020 4:54:48 PM By: Baruch Gouty RN, BSN Entered By: Baruch Gouty on 06/23/2020 08:01:18 -------------------------------------------------------------------------------- Wound Assessment Details Patient Name: Date of Service: [name] 06/23/2020 8:00 A M Medical Record Number: [id number] Patient Account Number: [id number] Date of Birth/Sex: Treating RN: 1945/08/01 (75 y.o. [name], [name] Primary Care Provider: [name] Other  Clinician: Referring Provider: Treating Provider/Extender: [name] in Treatment: 26 Wound Status Wound Number: 5 Primary Diabetic Wound/Ulcer of the Lower Extremity Etiology: Wound Location: Left Metatarsal head fifth Wound Open Wounding Event: Gradually Appeared Status: Date Acquired: 10/31/2019 Comorbid Cataracts, Coronary Artery Disease, Hypertension, Type II Weeks Of Treatment: 26 History: Diabetes, Neuropathy Clustered Wound: No Photos Wound Measurements Length: (cm) 0.7 Width: (cm) 0.7 Depth: (cm) 0.5 Area: (cm) 0.385 Volume: (cm) 0.192 % Reduction in Area: 68.2% % Reduction in Volume: 77.3% Epithelialization: None Tunneling: No Undermining: Yes Starting Position (o'clock): 12 Ending Position (o'clock): 12 Maximum Distance: (cm) 0.2 Wound Description Classification: Grade 3 Wound Margin: Well defined, not attached Exudate Amount: Medium Exudate Type: Serosanguineous Exudate Color: red, brown Foul Odor After Cleansing: No Slough/Fibrino Yes Wound Bed Granulation Amount: Large (67-100%) Exposed Structure Granulation Quality: Red, Friable Fascia Exposed: No Necrotic Amount: Small (1-33%) Fat Layer (Subcutaneous Tissue) Exposed: Yes Necrotic Quality: Adherent Slough Tendon Exposed: No Muscle Exposed: No Joint Exposed: No Bone Exposed: No Treatment Notes Wound #5 (Metatarsal head fifth) Wound Laterality: Left Cleanser Peri-Wound Care Zinc Oxide Ointment 30g tube Discharge Instruction: Apply Zinc Oxide to periwound with each dressing change as needed for maceration Sween Lotion (Moisturizing lotion) Discharge Instruction: Apply moisturizing lotion as directed Topical Primary Dressing KerraCel Ag Gelling Fiber Dressing, 2x2 in (silver alginate) Discharge Instruction: Lightly pack into undermining Secondary Dressing Woven Gauze Sponge, Non-Sterile 4x4 in Discharge Instruction: Apply over primary dressing as  directed. Optifoam Non-Adhesive Dressing, 4x4 in Discharge Instruction: Foam donut Secured With Elastic Bandage 4 inch (ACE bandage) Discharge Instruction: Secure with ACE bandage as directed. Kerlix Roll Sterile, 4.5x3.1 (in/yd) Discharge Instruction: Secure with Kerlix as directed. Compression Wrap Compression Stockings Add-Ons Electronic Signature(s) Signed: 06/23/2020 4:34:48 PM By: Sandre Kitty Signed: 06/23/2020 5:08:13 PM By: Rhae Hammock RN Entered By: Sandre Kitty on 06/23/2020 16:03:29 -------------------------------------------------------------------------------- Vitals Details Patient Name: Date of Service: [name] 06/23/2020 8:00 A M Medical Record Number:  [id number] Patient Account Number: [id number] Date of Birth/Sex: Treating RN: 06/16/1945 (76 y.o. [name], [name] Primary Care Provider: [name] Other Clinician: Referring Provider: Treating Provider/Extender: [name] in Treatment: 26 Vital Signs Time Taken: 08:02 Temperature (F): 98.2 Height (in): 72 Pulse (bpm): 74 Weight (lbs): 270 Respiratory Rate (breaths/min): 17 Body Mass Index (BMI): 36.6 Blood Pressure (mmHg): 183/79 Capillary Blood Glucose (mg/dl): 151 Reference Range: 80 - 120 mg / dl Electronic Signature(s) Signed: 06/23/2020 5:08:13 PM By: Rhae Hammock RN Entered By: Rhae Hammock on 06/23/2020 08:03:59

## 2020-06-26 ENCOUNTER — Ambulatory Visit (HOSPITAL_COMMUNITY): Admission: RE | Admit: 2020-06-26 | Payer: PPO | Source: Ambulatory Visit

## 2020-06-26 ENCOUNTER — Inpatient Hospital Stay (HOSPITAL_COMMUNITY): Admission: RE | Admit: 2020-06-26 | Payer: PPO | Source: Ambulatory Visit

## 2020-06-26 NOTE — Telephone Encounter (Signed)
Received voicemail from Bagley with [facility] on 2/25 at 5:30 that patient is okay to continue with picc appointment. Is okay with Daptomycin copay.

## 2020-06-26 NOTE — Telephone Encounter (Signed)
Patient called today stating that he would like to have IV antibiotic orders sent to a different home infusion group. States that he spoke with his insurance who quoted him $30 copay for infusions. Patient will call [facility] and [facility] for copay estimate.   Patient will cancel his appointment for picc today. Understands he must update office where he would like infusion orders sent.  Spoke with Jackelyn Poling at [facility] who states patient called this AM to request services with different home infusion provider. Per Jackelyn Poling patient has not meet his deductible. Per insurance patient copay for home infusion and antibiotic is $130.

## 2020-06-26 NOTE — Telephone Encounter (Signed)
Received follow up call from pt who would like orders to sent to [facility]. Orders need to inclue AMA kit. Once orders have been received Tine at [facility] can provider copay cost.  P: [phone] [id number] F: [phone]  Received call from Carolynn Sayers stating that if provider is okay with changing order from Daptomycin to Vancomycin copay would be $40. New orders would need to be sent to [facility] and [facility].  Will reach out to Dr. Tommy Medal.

## 2020-06-27 ENCOUNTER — Other Ambulatory Visit (HOSPITAL_COMMUNITY): Payer: Self-pay | Admitting: Infectious Disease

## 2020-06-27 DIAGNOSIS — M869 Osteomyelitis, unspecified: Secondary | ICD-10-CM

## 2020-06-27 NOTE — Telephone Encounter (Signed)
Patient called office to reschedule picc appointment. Would like to have appt either Wednesday or Friday anytime. Or Thursday after 10. Patient would like office to update [facility] once appt is scheduled. Will refax orders to short stay.

## 2020-06-27 NOTE — Telephone Encounter (Signed)
RN faxed orders to [facility] Stay.   [name], RN

## 2020-06-27 NOTE — Telephone Encounter (Signed)
Called patient in regards to choosing home infusion group. Patient states that he is interested in continuing with [facility], but is waiting on insurance to call him back to break down cost. Patient is under the impression all of the infusion cost should be $30.  Informed patient that additional cost is coming from home health services. Patient is requesting office hold off on scheduling picc line until he clarify's everything under insurance.

## 2020-06-27 NOTE — Telephone Encounter (Signed)
Spoke with Otila Kluver at [facility] who states patient's copay is $126. Waiting on patient to return call before moving forward. Patient will call office back later today if he does decide to stay with [facility]. Understands he must call soon to reschedule picc appointment.

## 2020-06-27 NOTE — Telephone Encounter (Signed)
Received called from [facility] [name] who scheduled picc appointment for 3/7 at 10 AM. Will need to arrive 15 mins early to check in at [facility]. Will need to resend orders to short stay. Patient is okay with time/date.  Spoke with Otila Kluver at [facility] who will have nursing see patient on 3/8. Will refax orders to short stay.

## 2020-06-28 NOTE — Telephone Encounter (Signed)
RN left message for [name] short stay to confirm that orders were received and to make them aware of patient's new PICC placement appointment.   [name], RN

## 2020-06-30 ENCOUNTER — Ambulatory Visit (INDEPENDENT_AMBULATORY_CARE_PROVIDER_SITE_OTHER): Payer: PPO | Admitting: Family Medicine

## 2020-06-30 ENCOUNTER — Other Ambulatory Visit: Payer: Self-pay

## 2020-06-30 ENCOUNTER — Other Ambulatory Visit (HOSPITAL_COMMUNITY): Payer: Self-pay | Admitting: *Deleted

## 2020-06-30 ENCOUNTER — Encounter: Payer: Self-pay | Admitting: Family Medicine

## 2020-06-30 ENCOUNTER — Ambulatory Visit (INDEPENDENT_AMBULATORY_CARE_PROVIDER_SITE_OTHER): Payer: PPO

## 2020-06-30 VITALS — BP 130/76 | HR 78 | Temp 97.6°F | Ht 73.0 in | Wt 263.6 lb

## 2020-06-30 DIAGNOSIS — R053 Chronic cough: Secondary | ICD-10-CM | POA: Diagnosis not present

## 2020-06-30 DIAGNOSIS — R059 Cough, unspecified: Secondary | ICD-10-CM | POA: Diagnosis not present

## 2020-06-30 DIAGNOSIS — I152 Hypertension secondary to endocrine disorders: Secondary | ICD-10-CM

## 2020-06-30 DIAGNOSIS — E1159 Type 2 diabetes mellitus with other circulatory complications: Secondary | ICD-10-CM | POA: Diagnosis not present

## 2020-06-30 DIAGNOSIS — I739 Peripheral vascular disease, unspecified: Secondary | ICD-10-CM

## 2020-06-30 MED ORDER — LOSARTAN POTASSIUM 25 MG PO TABS: 12.5000 mg | ORAL_TABLET | Freq: Every day | ORAL | 3 refills | Status: DC

## 2020-06-30 NOTE — Progress Notes (Signed)
Phone [phone] In person visit   Subjective:   AUDON HEYMANN Sr. is a 75 y.o. year old very pleasant male patient who presents for/with See problem oriented charting Chief Complaint  Patient presents with  . Sinusitis    Sinus drainage, "nagging" cough, over a month but nothings helping.    This visit occurred during the SARS-CoV-2 public health emergency.  Safety protocols were in place, including screening questions prior to the visit, additional usage of staff PPE, and extensive cleaning of exam room while observing appropriate contact time as indicated for disinfecting solutions.   Past Medical History-  Patient Active Problem List   Diagnosis Date Noted  . Postoperative atrial fibrillation ([place]) 10/24/2018    Priority: High  . 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 Undiagnosed cardiac murmurs 08/15/2015    Priority: High  . DM (diabetes mellitus) type II uncontrolled with eye manifestation ([place]) 02/03/2007    Priority: High  . 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 PAD (peripheral artery disease) ([place]) 08/20/2017    Priority: Medium  . Partial tear of right Achilles tendon 02/12/2017    Priority: Medium  . Aortic atherosclerosis ([place]) 10/07/2016    Priority: Medium  . History of skin cancer 08/15/2015    Priority: Medium  . Diabetic retinopathy ([place]) 02/21/2015    Priority: Medium  . Hyperlipidemia associated with type 2 diabetes mellitus ([place]) 08/24/2014    Priority: Medium  . Diabetic polyneuropathy ([place]) 04/11/2009    Priority: Medium  . Hypertension associated with diabetes ([place]) 02/03/2007    Priority: Medium  . Acute blood loss as cause of postoperative anemia 10/24/2018    Priority: Low  . BPH (benign prostatic hyperplasia) 10/24/2018    Priority: Low  . History of depression 10/24/2018    Priority: Low  . S/P CABG x 4 10/01/2018    Priority: Low  . Onychomycosis of toenail 02/17/2018     Priority: Low  . Allergic sinusitis 08/20/2017    Priority: Low  . C. difficile colitis 09/25/2016    Priority: Low  . History of aspiration pneumonia 09/25/2016    Priority: Low  . Former smoker 08/24/2014    Priority: Low  . Ingrowing toenail 07/19/2014    Priority: Low  . BACK PAIN, LUMBAR, WITH RADICULOPATHY 08/30/2008    Priority: Low  . Osteomyelitis of fifth toe of left foot ([place]) 05/08/2020  . Simple chronic bronchitis ([place]) 05/08/2020    Medications- reviewed and updated Current Outpatient Medications  Medication Sig Dispense Refill  . aspirin 81 MG EC tablet Take 1 tablet (81 mg total) by mouth daily.    . clopidogrel (PLAVIX) 75 MG tablet Take 1 tablet (75 mg total) by mouth daily. 90 tablet 0  . ezetimibe (ZETIA) 10 MG tablet TAKE 1 TABLET BY MOUTH EVERY DAY (Patient taking differently: Take 10 mg by mouth daily.) 90 tablet 0  . fexofenadine (ALLEGRA) 180 MG tablet Take 180 mg by mouth daily as needed for allergies or rhinitis.    . fluticasone (FLONASE) 50 MCG/ACT nasal spray Place 2 sprays into both nostrils daily as needed for allergies or rhinitis.    Marland Kitchen insulin aspart (NOVOLOG FLEXPEN) 100 UNIT/ML FlexPen 3 times a day (just before each meal) 25-20-25 units, and pen needles 4/day (Patient taking differently: Inject 20-25 Units into the skin See admin instructions. 3 times a day (just before each meal) 25-20-25 units, and pen  needles 4/day) 25 pen 3  . insulin glargine (LANTUS SOLOSTAR) 100 UNIT/ML Solostar Pen Inject 10 Units into the skin at bedtime. 5 pen PRN  . Insulin Pen Needle (NOVOFINE) 32G X 6 MM MISC USE 5 TIMES DAILY 450 each 2  . linezolid (ZYVOX) 600 MG tablet Take 1 tablet (600 mg total) by mouth 2 (two) times daily. To be taken at first sign of cellulitis around foot (Patient taking differently: Take 600 mg by mouth 2 (two) times daily as needed. To be taken at first sign of cellulitis around foot) 28 tablet 3  . LIVALO 1 MG TABS TAKE 1 TABLET BY MOUTH EVERY  DAY (Patient taking differently: Take 1 mg by mouth daily.) 90 tablet 3  . metoprolol tartrate (LOPRESSOR) 50 MG tablet TAKE 1 TABLET BY MOUTH TWICE A DAY (Patient taking differently: Take 50 mg by mouth 2 (two) times daily.) 180 tablet 1  . ONETOUCH VERIO test strip CHECK LEVELS 4 TIMES DAILY 150 strip 1  . torsemide (DEMADEX) 20 MG tablet Take 20 mg by mouth daily.    . vitamin C (ASCORBIC ACID) 250 MG tablet Take 250 mg by mouth daily.    Marland Kitchen losartan (COZAAR) 25 MG tablet Take 0.5 tablets (12.5 mg total) by mouth daily. 45 tablet 3   No current facility-administered medications for this visit.     Objective:  BP 130/76   Pulse 78   Temp 97.6 F (36.4 C) (Temporal)   Ht 6\' 1"  (1.854 m)   Wt 263 lb 9.6 oz (119.6 kg)   SpO2 99%   BMI 34.78 kg/m  Gen: NAD, resting comfortably CV: RRR no murmurs rubs or gallops Lungs: Faint crackles in bilateral lung bases.  No wheeze noted-intermittent mild rhonchi as well Ext: no edema Left foot in walking boot    Assessment and Plan   # Chronic cough S: Patient seen May 16, 2020 by video visit.  Patient complains of fullness in ears/sinuses starting late December for at least 3 weeks prior to visit.  Prior prior COVID-19 testing was negative.  He had tried Claritin which helped for a few days as well as Mucinex for 7 to 8 days with mild help.  He also noted runny nose, nasal drip, coughing up phlegm as well as sinus and ear congestion that appeared to be clear to white.  We treated him with a 7-day course of Augmentin and recommended Claritin/Allegra before bedtime in case there was no allergic component  Today patient reports, he felt at least 50% better after the augmentin- fluid in ear issue almost resolved but still mild. Feels like having lingering 20% of congestion. Gets paranoid about it because people thinks he has covid. Blows nose in morning and gets some discharge but not a lot. Started flonase before bed or Nasacort at night- that did  help some- 2 sprays each nostril. Quit allegra at night as was feeling some lightheaded during the day.   PICC line to be placed on Monday and will be on daptomycin for osteomyelitis with plan for 8 weeks. linezolid is available if needed but not on that now.  A/P: Cough since November or December that improved significantly on Augmentin 7-day course but is still approximately persistent at about 20% of its worst.  Patient has upcoming PICC line and plan treatment with daptomycin-he would like to make sure he does not have an infection in his lungs before undergoing this-we will get a chest x-ray today (does have some crackles  faint bilateral bases- has been sedentary though and wonder about atelectasis as well) . -If chest x-ray is normal and if no improvement within 2-3 weeks may add astelin nasal spray in case allergic element as well as a reflux medicine- since you see me in April we could also wait until that visit if you prefer -Sometime in situations like this I would consider prednisone for postviral cough but with osteomyelitis I do not want to pursue that at this time -Any antibiotic changes I would like to let Dr. Tommy Medal know to keep him in the loop and make sure he is okay with our plan    #hypertension S: medication: Losartan 25 mg-increased from 12.5 mg last visit but had to go back to this- blood pressure reportedly better after revascularizatoin, metoprolol 50 mg twice daily, torsemide 40 mg, BP Readings from Last 3 Encounters:  06/30/20 130/76  06/16/20 (!) 106/56  06/05/20 (!) 179/89  A/P: home blood pressures improved and usually 244W to 102V systolic with controlled diastolics. For now continue -losartan 12.5mg  -metoprolol 50mg  BID -torsemide 40mg   #Peripheral arterial disease-recent angioplasty with interventional radiology #hyperlipidemia S: Medication: Aspirin 81 mg, Plavix 75 mg, Zetia 10 mg, Livalo 1 mg Lab Results  Component Value Date   CHOL 132 07/08/2019   HDL  46.10 07/08/2019   LDLCALC 71 07/08/2019   LDLDIRECT 173.0 07/01/2018   TRIG 75.0 07/08/2019   CHOLHDL 3 07/08/2019   A/P: Lipids have been just a hair above goal but has had issues tolerating statin in the past and this has been the best combination of drugs for him.  Continue aspirin and Plavix together after discussions with interventional radiology.  PAD in better position after revascularization but apparently may need an additional procedure per his report-continue current medication  Recommended follow up: We have a visit on April 20 but I want him to update me in 2 to 3 weeks if not improving Future Appointments  Date Time Provider [place]  07/03/2020 10:00 AM MC-IR 1 [facility]-IR [facility]  07/03/2020 11:00 AM MCINF-RM5 MC-MCINF None  07/13/2020  8:30 AM [facility] [place] 4 [facility]  07/13/2020  9:30 AM [facility] IR [facility]  07/20/2020  8:00 AM Ricard Dillon, MD [facility]  08/02/2020  9:45 AM Tommy Medal, Lavell Islam, MD [facility]  08/16/2020  9:20 AM Yong Channel, [name], MD [facility]    Lab/Order associations:   ICD-10-CM   1. Chronic cough  R05.3 DG Chest 2 View  2. PAD (peripheral artery disease) (HCC)  I73.9   3. Hypertension associated with diabetes ([place])  E11.59    I15.2     Meds ordered this encounter  Medications  . losartan (COZAAR) 25 MG tablet    Sig: Take 0.5 tablets (12.5 mg total) by mouth daily.    Dispense:  45 tablet    Refill:  3    Return precautions advised.  [name], MD

## 2020-06-30 NOTE — Patient Instructions (Addendum)
Health Maintenance Due  Topic Date Due  . Fecal DNA (Cologuard)- if not able to get this done before next visit consider colonoscopy Never done   Please stop by x-ray before you go If you have mychart- we will send your results within 3 business days of [place] receiving them.  If you do not have mychart- we will call you about results within 5 business days of [place] receiving them.   -if no improvement within 2-3 weeks may add astelin nasal spray in case allergic element as well as a reflux medicine- since you see me in April we could also wait until that visit if you prefer   Recommended follow up: keep April visit- update me in 2-3 weeks if not improving

## 2020-07-03 ENCOUNTER — Other Ambulatory Visit: Payer: Self-pay

## 2020-07-03 ENCOUNTER — Encounter (HOSPITAL_COMMUNITY)
Admission: RE | Admit: 2020-07-03 | Discharge: 2020-07-03 | Disposition: A | Discharge status: Home / Self Care | Payer: PPO | Source: Ambulatory Visit | Attending: Infectious Disease | Admitting: Infectious Disease

## 2020-07-03 ENCOUNTER — Ambulatory Visit (HOSPITAL_COMMUNITY)
Admission: RE | Admit: 2020-07-03 | Discharge: 2020-07-03 | Disposition: A | Discharge status: Home / Self Care | Payer: PPO | Source: Ambulatory Visit | Attending: Infectious Disease | Admitting: Infectious Disease

## 2020-07-03 DIAGNOSIS — M868X7 Other osteomyelitis, ankle and foot: Secondary | ICD-10-CM | POA: Diagnosis not present

## 2020-07-03 DIAGNOSIS — Z452 Encounter for adjustment and management of vascular access device: Secondary | ICD-10-CM | POA: Diagnosis not present

## 2020-07-03 DIAGNOSIS — A4902 Methicillin resistant Staphylococcus aureus infection, unspecified site: Secondary | ICD-10-CM | POA: Diagnosis not present

## 2020-07-03 DIAGNOSIS — M869 Osteomyelitis, unspecified: Secondary | ICD-10-CM | POA: Diagnosis not present

## 2020-07-03 MED ORDER — HEPARIN SOD (PORK) LOCK FLUSH 100 UNIT/ML IV SOLN: 250.0000 [IU] | INTRAVENOUS | Status: DC | PRN

## 2020-07-03 MED ORDER — SODIUM CHLORIDE 0.9 % IV SOLN
1000.0000 mg | Freq: Once | INTRAVENOUS | Status: AC
  Administered 2020-07-03: 1000 mg via INTRAVENOUS
  Filled 2020-07-03: qty 20

## 2020-07-03 MED ORDER — LIDOCAINE HCL 1 % IJ SOLN
INTRAMUSCULAR | Status: AC
  Filled 2020-07-03: qty 20

## 2020-07-03 MED ORDER — HEPARIN SOD (PORK) LOCK FLUSH 100 UNIT/ML IV SOLN: 250.0000 [IU] | Freq: Every day | INTRAVENOUS | Status: DC

## 2020-07-03 MED ORDER — HEPARIN SOD (PORK) LOCK FLUSH 100 UNIT/ML IV SOLN
INTRAVENOUS | Status: AC
  Administered 2020-07-03: 250 [IU]
  Filled 2020-07-03: qty 5

## 2020-07-03 MED ORDER — LIDOCAINE HCL 1 % IJ SOLN
INTRAMUSCULAR | Status: DC | PRN
  Administered 2020-07-03: 10 mL

## 2020-07-03 NOTE — Procedures (Signed)
Interventional Radiology Procedure Note  Procedure: RUE SL POWER PICC    Complications: None  Estimated Blood Loss:  MIN  Findings: TIP SVCRA    Tamera Punt, MD

## 2020-07-04 DIAGNOSIS — M86272 Subacute osteomyelitis, left ankle and foot: Secondary | ICD-10-CM | POA: Diagnosis not present

## 2020-07-10 DIAGNOSIS — M86272 Subacute osteomyelitis, left ankle and foot: Secondary | ICD-10-CM | POA: Diagnosis not present

## 2020-07-13 ENCOUNTER — Ambulatory Visit
Admission: RE | Admit: 2020-07-13 | Discharge: 2020-07-13 | Disposition: A | Discharge status: Home / Self Care | Payer: PPO | Source: Ambulatory Visit | Attending: Interventional Radiology | Admitting: Interventional Radiology

## 2020-07-13 ENCOUNTER — Telehealth: Payer: Self-pay

## 2020-07-13 ENCOUNTER — Encounter: Payer: Self-pay | Admitting: *Deleted

## 2020-07-13 DIAGNOSIS — E08621 Diabetes mellitus due to underlying condition with foot ulcer: Secondary | ICD-10-CM

## 2020-07-13 DIAGNOSIS — R0989 Other specified symptoms and signs involving the circulatory and respiratory systems: Secondary | ICD-10-CM | POA: Diagnosis not present

## 2020-07-13 DIAGNOSIS — M86272 Subacute osteomyelitis, left ankle and foot: Secondary | ICD-10-CM | POA: Diagnosis not present

## 2020-07-13 DIAGNOSIS — L97529 Non-pressure chronic ulcer of other part of left foot with unspecified severity: Secondary | ICD-10-CM | POA: Diagnosis not present

## 2020-07-13 DIAGNOSIS — I739 Peripheral vascular disease, unspecified: Secondary | ICD-10-CM | POA: Diagnosis not present

## 2020-07-13 DIAGNOSIS — L97401 Non-pressure chronic ulcer of unspecified heel and midfoot limited to breakdown of skin: Secondary | ICD-10-CM

## 2020-07-13 DIAGNOSIS — E11621 Type 2 diabetes mellitus with foot ulcer: Secondary | ICD-10-CM | POA: Diagnosis not present

## 2020-07-13 HISTORY — PX: IR RADIOLOGIST EVAL & MGMT: IMG5224

## 2020-07-13 NOTE — Progress Notes (Signed)
Referring Physician(s): Dr. Linton Ham  Chief Complaint: The patient is seen in follow up today s/p left lower extremity angiogram, laser atherectomy of the popliteal and proximal anterior tibial arteries with balloon and drug coated angioplasty on 06/16/20.  History of present illness: Mr. Monsanto is recovering very well after his procedure.  He had some minor bruising and discomfort at the right groin puncture site which has resolved.  He continues to wear his boot and states that his ulcer is nearly healed with decreased redness.  He got a PICC placed and is on IV antibiotics managed by Infectious Disease.  He stubbed his great toe last night and tore his toenail which has been bleeding some.  Otherwise no new ulcerations or foot wounds.  He continues to take aspirin and Plavix which is well tolerated.  Past Medical History:  Diagnosis Date  . A-fib ([place])   . Diabetes mellitus   . Heart attack ([place]) 10/01/2018   Pt had open heart surgery  . Hypertension   . Osteomyelitis of fifth toe of left foot ([place]) 05/08/2020  . Simple chronic bronchitis ([place]) 05/08/2020  . Tobacco abuse     Past Surgical History:  Procedure Laterality Date  . CORONARY ARTERY BYPASS GRAFT N/A 10/01/2018   Procedure: CORONARY ARTERY BYPASS GRAFTING (CABG) TIMES  FOUR USING LEFT MAMMARY ARTERY AND RIGHT GREATER SAPHEANOUS VEIN HARVESTED ENDOSCOPICALLY;  Surgeon: Melrose Nakayama, MD;  Location: [facility];  Service: Open Heart Surgery;  Laterality: N/A;  . HERNIA REPAIR     >10 years ago  . IR ANGIOGRAM EXTREMITY LEFT  06/16/2020  . IR RADIOLOGIST EVAL & MGMT  12/24/2016  . IR RADIOLOGIST EVAL & MGMT  05/30/2020  . IR THORACENTESIS ASP PLEURAL SPACE W/IMG GUIDE  10/06/2018  . IR US GUIDE VASC ACCESS RIGHT  06/16/2020  . LEFT HEART CATH AND CORONARY ANGIOGRAPHY N/A 09/29/2018   Procedure: LEFT HEART CATH AND CORONARY ANGIOGRAPHY;  Surgeon: Troy Sine, MD;  Location: [facility];  Service: Cardiovascular;   Laterality: N/A;  . open heart surfery    . right shoulder surgery     around 2014  . TEE WITHOUT CARDIOVERSION N/A 10/01/2018   Procedure: TRANSESOPHAGEAL ECHOCARDIOGRAM (TEE);  Surgeon: Melrose Nakayama, MD;  Location: [facility];  Service: Open Heart Surgery;  Laterality: N/A;    Allergies: Simvastatin, Statins, and Xanax [alprazolam]  Medications: Prior to Admission medications   Medication Sig Start Date End Date Taking? Authorizing Provider  aspirin 81 MG EC tablet Take 1 tablet (81 mg total) by mouth daily. 02/04/19   Troy Sine, MD  clopidogrel (PLAVIX) 75 MG tablet Take 1 tablet (75 mg total) by mouth daily. 06/05/20   Tomasa Dobransky, Rosanne Ashing, MD  ezetimibe (ZETIA) 10 MG tablet TAKE 1 TABLET BY MOUTH EVERY DAY Patient taking differently: Take 10 mg by mouth daily. 05/04/20   Troy Sine, MD  fexofenadine (ALLEGRA) 180 MG tablet Take 180 mg by mouth daily as needed for allergies or rhinitis.    [provider]  fluticasone (FLONASE) 50 MCG/ACT nasal spray Place 2 sprays into both nostrils daily as needed for allergies or rhinitis.    [provider]  insulin aspart (NOVOLOG FLEXPEN) 100 UNIT/ML FlexPen 3 times a day (just before each meal) 25-20-25 units, and pen needles 4/day Patient taking differently: Inject 20-25 Units into the skin See admin instructions. 3 times a day (just before each meal) 25-20-25 units, and pen needles 4/day 07/13/19  Renato Shin, MD  insulin glargine (LANTUS SOLOSTAR) 100 UNIT/ML Solostar Pen Inject 10 Units into the skin at bedtime. 07/13/19   Renato Shin, MD  Insulin Pen Needle (NOVOFINE) 32G X 6 MM MISC USE 5 TIMES DAILY 07/05/19   Renato Shin, MD  linezolid (ZYVOX) 600 MG tablet Take 1 tablet (600 mg total) by mouth 2 (two) times daily. To be taken at first sign of cellulitis around foot Patient taking differently: Take 600 mg by mouth 2 (two) times daily as needed. To be taken at first sign of cellulitis around foot 06/05/20   Tommy Medal,  Lavell Islam, MD  LIVALO 1 MG TABS TAKE 1 TABLET BY MOUTH EVERY DAY Patient taking differently: Take 1 mg by mouth daily. 05/15/20   Marin Olp, MD  losartan (COZAAR) 25 MG tablet Take 0.5 tablets (12.5 mg total) by mouth daily. 06/30/20   Marin Olp, MD  metoprolol tartrate (LOPRESSOR) 50 MG tablet TAKE 1 TABLET BY MOUTH TWICE A DAY Patient taking differently: Take 50 mg by mouth 2 (two) times daily. 03/21/20   Troy Sine, MD  ONETOUCH VERIO test strip CHECK LEVELS 4 TIMES DAILY 06/20/20   Renato Shin, MD  torsemide (DEMADEX) 20 MG tablet Take 20 mg by mouth daily.    [provider]  vitamin C (ASCORBIC ACID) 250 MG tablet Take 250 mg by mouth daily.    [provider]     Family History  Problem Relation Age of Onset  . Hypertension Mother   . Heart disease Mother        CHF did not see doctor  . Diabetes Maternal Grandmother   . Diabetes Son     Social History   Socioeconomic History  . Marital status: Married    Spouse name: Not on file  . Number of children: Not on file  . Years of education: Not on file  . Highest education level: Not on file  Occupational History  . Not on file  Tobacco Use  . Smoking status: Former Smoker    Packs/day: 0.75    Years: 2.00    Pack years: 1.50    Types: Cigarettes    Start date: 2002    Quit date: 04/29/2002    Years since quitting: 18.2  . Smokeless tobacco: Never Used  . Tobacco comment: smoked for 10 years on and off  Substance and Sexual Activity  . Alcohol use: Yes    Alcohol/week: 0.0 standard drinks    Comment: 6 martinis a year  . Drug use: No  . Sexual activity: Yes  Other Topics Concern  . Not on file  Social History Narrative   Married. 3 kids. 7 grandkids. No greatgrandkids.       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  Physical Exam Constitutional:      General: He is not in acute distress.    Appearance: Normal appearance.  HENT:     Head: Normocephalic.     Mouth/Throat:     Mouth: Mucous membranes are moist.  Pulmonary:  Effort: Pulmonary effort is normal.  Abdominal:     General: There is no distension.  Musculoskeletal:     Comments: Punctate eschar at head of left 5th metatarsal without surrounding erythema or purulence.  No significant lower extremity edema.  Left great toenail wrapped in bandage.    Skin:    General: Skin is warm and dry.  Neurological:     Mental Status: He is alert.     Imaging: Segmental Pressures/ABIs (07/13/20) Right ABI = 1.17 (previously 1.18) Left ABI = 0.85 (previously 0.85)  Labs:  CBC: Recent Labs    07/20/19 1639 02/09/20 1030 05/08/20 1020 06/16/20 0730  WBC 10.0 7.2 7.0 9.1  HGB 11.7* 12.5* 10.8* 10.9*  HCT 37.2* 38.0* 33.2* 35.7*  PLT 291 239 197 212    COAGS: Recent Labs    06/16/20 0730  INR 1.1    BMP: Recent Labs    07/20/19 1639 02/09/20 1030 05/08/20 1020 06/16/20 0730  NA 135 138 138 136  K 5.0 4.6 5.0 3.9  CL 96* 102 104 101  CO2 29 27 27 25   GLUCOSE 191* 161* 196* 208*  BUN 18 24 27* 30*  CALCIUM 9.1 9.3 9.2 8.9  CREATININE 1.10 1.39* 1.36* 1.44*  GFRNONAA >60 49* 51* 51*  GFRAA >60 57* 59*  --     LIVER FUNCTION TESTS: Recent Labs    07/20/19 1639 02/09/20 1030  BILITOT 1.2 1.9*  AST 19 23  ALT 19 22  ALKPHOS 58  --   PROT 8.1 7.4  ALBUMIN 4.4  --     Assessment and Plan: 75 year old male with history of [place] 5 peripheral artery disease status post left popliteal and anterior tibial artery revascularization (laser atherectomy, balloon angioplasty, drug coated balloon angioplasty)  now with significant healing of left base of 5th metatarsal wound with underlying osteomyelitis.  Agree with continuing excellent local wound care, antibiotics as per Infectious Disease, and dual antiplatelet therapy.    Will follow up in 2 months in clinic.  If wound healed at this time, will discontinue Plavix.   Electronically Signed: Suzette Battiest 07/13/2020, 9:58 AM   I spent a total of 25 Minutes in face to face in clinical consultation, greater than 50% of which was counseling/coordinating care for peripheral artery disease.

## 2020-07-13 NOTE — Telephone Encounter (Signed)
Received fax from [facility] that patient's home infusion visits for daptomycin have been denied. RN spoke to Emmit Pomfret at [facility], she states she will look into what needs to be done and call our office back.   [name], RN

## 2020-07-13 NOTE — Telephone Encounter (Signed)
Per Sharrie Rothman at [facility], daptomycin is still covered. Patient will just need designated home health agency to do the infusions from now on instead of the infusion company completing his visits. Sharrie Rothman states she will call around to find an in-network home health agency and update [place] accordingly.   [name], RN

## 2020-07-14 ENCOUNTER — Encounter (HOSPITAL_BASED_OUTPATIENT_CLINIC_OR_DEPARTMENT_OTHER): Payer: PPO | Admitting: Internal Medicine

## 2020-07-14 DIAGNOSIS — E113213 Type 2 diabetes mellitus with mild nonproliferative diabetic retinopathy with macular edema, bilateral: Secondary | ICD-10-CM | POA: Diagnosis not present

## 2020-07-14 DIAGNOSIS — H40023 Open angle with borderline findings, high risk, bilateral: Secondary | ICD-10-CM | POA: Diagnosis not present

## 2020-07-14 DIAGNOSIS — H35013 Changes in retinal vascular appearance, bilateral: Secondary | ICD-10-CM | POA: Diagnosis not present

## 2020-07-14 DIAGNOSIS — H43813 Vitreous degeneration, bilateral: Secondary | ICD-10-CM | POA: Diagnosis not present

## 2020-07-17 DIAGNOSIS — M86272 Subacute osteomyelitis, left ankle and foot: Secondary | ICD-10-CM | POA: Diagnosis not present

## 2020-07-18 NOTE — Telephone Encounter (Signed)
RN spoke with Martin [name] at [facility], she states they have been unsuccessful so far in finding a home health agency for the patient. [facility] will continue to see the patient and provide infusions until they are able to find an agency. Martin [name] states she will call back once they find home health in order to request home health orders.   [name], RN

## 2020-07-20 ENCOUNTER — Encounter (HOSPITAL_BASED_OUTPATIENT_CLINIC_OR_DEPARTMENT_OTHER): Payer: PPO | Attending: Internal Medicine | Admitting: Internal Medicine

## 2020-07-20 ENCOUNTER — Other Ambulatory Visit: Payer: Self-pay

## 2020-07-20 DIAGNOSIS — E11319 Type 2 diabetes mellitus with unspecified diabetic retinopathy without macular edema: Secondary | ICD-10-CM | POA: Insufficient documentation

## 2020-07-20 DIAGNOSIS — Z794 Long term (current) use of insulin: Secondary | ICD-10-CM | POA: Insufficient documentation

## 2020-07-20 DIAGNOSIS — Z09 Encounter for follow-up examination after completed treatment for conditions other than malignant neoplasm: Secondary | ICD-10-CM | POA: Insufficient documentation

## 2020-07-20 DIAGNOSIS — E1142 Type 2 diabetes mellitus with diabetic polyneuropathy: Secondary | ICD-10-CM | POA: Insufficient documentation

## 2020-07-20 DIAGNOSIS — M86272 Subacute osteomyelitis, left ankle and foot: Secondary | ICD-10-CM | POA: Diagnosis not present

## 2020-07-20 DIAGNOSIS — E1151 Type 2 diabetes mellitus with diabetic peripheral angiopathy without gangrene: Secondary | ICD-10-CM | POA: Insufficient documentation

## 2020-07-20 DIAGNOSIS — Z8631 Personal history of diabetic foot ulcer: Secondary | ICD-10-CM | POA: Insufficient documentation

## 2020-07-20 DIAGNOSIS — L97522 Non-pressure chronic ulcer of other part of left foot with fat layer exposed: Secondary | ICD-10-CM | POA: Diagnosis not present

## 2020-07-20 DIAGNOSIS — L03116 Cellulitis of left lower limb: Secondary | ICD-10-CM | POA: Diagnosis not present

## 2020-07-20 DIAGNOSIS — E11621 Type 2 diabetes mellitus with foot ulcer: Secondary | ICD-10-CM | POA: Diagnosis not present

## 2020-07-20 NOTE — Progress Notes (Addendum)
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 cast has been beneficial for him over the past week which is great news. He unfortunately has not gotten the medication started as far as the linezolid was concerned as the pharmacy actually got things confused and gave him a prescription for doxycycline I called and canceled previously as he was resistant to the doxycycline. Nonetheless he can start that today which is good news. We are also working on getting the hyperbarics  approved for him that something that he would like to consider as well. Again we are in the process of figuring all that out. 10/14; patient I know from his stay in this clinic several years ago although have not seen him on this admission. He is a type II diabetic he has had an MRI that shows osteomyelitis. I believe a swab culture showed MRSA he received a course of doxycycline but now has been on linezolid for a week. He says linezolid is causing some mild nausea. But otherwise he is tolerating this well. He will need a another prescription for this which I will take care of today. We have been using silver alginate on the wound under a total contact cast. The wound is improving both in terms of appearance and  surface area. He has some concerns about HBO which she is already approved for predominantly anxiety. He states he has had anxiety since open heart surgery a year ago 02/16/2020 on evaluation today patient appears to be doing better in regard to his foot ulcer in my opinion. Things are actually looking good in this regard. With that being said he does have some side effects from the linezolid. He tells me that he has been somewhat nauseated but mainly when he does not eat with the medication. Evenings are okay the mornings when he tends to eat less sometimes seem to be worse. With that being said this seems to be something that he can mitigate. With that being said he also has a rash however in the groin area that he tells me about today. He thinks that this may be due to the medication. Subsequently I think that it is due to the medication in a roundabout way and that the patient has what appears to be a yeast infection/tinea cruris which is likely indirectly secondary related to the fact that the patient has been on strong antibiotics for some time now due to the osteomyelitis. However I do not think it is a direct side effect of the medication itself per se. 02/23/2020 upon evaluation the  patient appears to be doing decently well in regard to his foot ulcer in fact I am very pleased with how things appear today. He does have less undermining and overall I think between the cast and now the start of the hyperbaric oxygen therapy he has a very good chance of getting this wound to heal. Fortunately there is no signs of active infection at this time which is great news. No fevers, chills, nausea, vomiting, or diarrhea. He does note that he has been having some issues with the linezolid causing him to be nauseated and he also states that it is caused him to lose his smell and taste. With that being said I am really not certain that this is indeed the reason behind this but either way I think we can switch the antibiotic being that he is looking so well with minimal linezolid for close to 3-4 weeks at this point. We will do 2 weeks of Bactrim and then hopefully he should be complete with the antibiotic courses. 03/01/2020 on evaluation today patient is making good progress in regard to his wound. This is measuring smaller today and overall very pleased. The antibiotics which has seemed to help him he states that he is having a lot of improvement overall in his smell and taste as well as improvement in the overall nausea that he was experiencing with the linezolid. Obviously this is great news. 03/08/2020 upon evaluation today patient appears to be doing well with regard to his foot ulcer. I feel like this is showing signs of improvement it is measuring a little bit better today compared to previous. With that being said there is no evidence of active infection which is great news and in general I feel like the hyperbarics is helping him along with the antibiotics which she will be completing I believe in the next week. Outside of this also feel like that the total contact cast obviously has been of benefit. 03/15/2020 on evaluation today patient appears to be doing well in regard to his foot  ulcer. He has been tolerating the dressing changes without complication. Fortunately there is no signs of active infection at this time. No fevers, chills, nausea, vomiting, or diarrhea.  Patient is tolerating hyperbarics quite well at this time and I see no signs of infection currently which is great news. 03/22/2020 on evaluation today patient appears to be doing about the same in regard to his foot ulcer. He does have some tissue along the medial portion of the wound bed and I am unsure of exactly what this is just characteristically. It could be a small amount of tendon here to be honest. With that being said I do think it needs to be removed as I feel like it is hindering his healing possibilities. 11/29; acute visit; I was asked to see the patient urgently today because of complaints of pain in his foot. He is a wound in the right fifth metatarsal head plantar aspect with underlying osteomyelitis. He has been undergoing hyperbaric oxygen treatment completing antibiotics in the middle of this month. He said the pain had increased to the point that it was becoming difficult for him to walk on his foot. He has not been systemically unwell.. He had a fairly significant debridement on his last visit 5 days ago. 03/29/2020 on evaluation today patient appears to be doing much better than he was on Monday according to the pictures and what I saw. Fortunately there is no signs of active infection at this time which is great news. There is no evidence of systemic infection either. Overall I feel like the patient is doing indeed much better and is just a matter of having him continue the antibiotic for the time being in my opinion and then depending on the results of the culture we may extend that for a bit longer. 12/10; I am seeing the patient today because of difficulty had this week with his wife's illness. He could not come on Wednesday. The wound looks a lot better than what I usually am used to  seeing. He is using silver alginate and a total contact cast continuing with HBO. He has completed his antibiotics cultures and x-rays were negative 04/12/2020 upon evaluation today patient unfortunately appears to be doing a little worse in regard to his wound from the standpoint of erythema surrounding. There does not appear to be any signs of active systemic infection which is great news. Nonetheless I am concerned about the fact that if we put him back in the cast he will not be able to visually see what was going on in regard to his foot and if anything is worsening he would have no idea into it could be potentially too late. 12/17; surrounding his dive yesterday afternoon it was brought to my attention about the extent of swelling in the left leg. I really did not have enough time to fully evaluate this before the end of his treatment so I arranged to have a duplex ultrasound done rule out DVT which thankfully turned out to be negative. I note that he was felt to have cellulitis of the left foot surrounding his wound when he was seen on Wednesday by Jeri Cos and was put on trimethoprim sulfamethoxazole. His report showed no evidence of a DVT or SVT . I am seeing him today in follow-up of this. As it turns out his calf measurements have not really changed that much but he has much more swelling in the ankle. It could be because we did not put a total contact cast back on him. I think he has some degree of chronic venous insufficiency. 12/20; I wanted to look at the patient's leg when he was up on  the table before he went into hyperbarics however he was put on the schedule today. He is still taking trimethoprim sulfamethoxazole. Culture that was done last Wednesday is negative. We put him in kerlix Coban wrap over the weekend still a lot of swelling in this leg. DVT rule out was negative there is no swelling in his thigh not sure I really have a good explanation for this. He is not complaining  of systemic symptoms 04/19/2020 upon evaluation today patient appears to be doing somewhat poorly in regard to his foot ulcer. Again he has seen Dr. Dellia Nims Dr. Dellia Nims did order repeat MRI after I agree with this and think that is the right way to go at this point. Depending on the results of the MRI I discussed with the patient today that we may be looking at a referral potentially for surgery consultation if this occurs the patient would like to see someone for second opinion other than Dr. Sharol Given I understand and I think we can definitely do that I have never discourage patients from second opinions. With that being said currently I do not see any evidence of worsening infection though there still appears to be significant erythema. The patient's culture did show that he is on the appropriate antibiotic with the Bactrim DS which I did prescribe for him last week he has another week of this remaining. I think he can continue to take that for the time being. 12/28; patient comes back in the clinic today with a lot of drainage on the outside of his left dorsal foot. Very significant erythema on the lateral part of the foot and dorsal part of the foot which I have marked with a marker. Also have his MRI to review that showed findings suspicious for progressive osteomyelitis involving the fifth metatarsal head and proximal phalanx no drainable fluid collection. This is obviously worse since his last study in September. He has not been systemically unwell. Says he is not running a fever his blood sugars are stable. 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 For this reason he is completely opposed to any surgery presently. Furthermore his arterial status might not allow foot conserving surgery. I have reviewed his arterial studies from 9/1. This showed an ABI on the left of 0.90 however his TBI was only 0.44 but a toe pressure of 68. Monophasic waveforms. I wonder if he has tibial  artery calcification and his ABIs are actually falsely elevated. 1/7; 1 week follow-up. Patient has completed the linezolid I gave him for MRSA cellulitis surrounding the wound in the left foot. This extended into his foot dorsally. This looks a lot better today and he has completed his antibiotics. He did not see infectious disease on Monday because her office was closed due to the weather however he has an appointment on Monday next. I have rediscussed his arterial status and I think the best thing to do would be to send him to an interventionalists. The patient has a good personal relationship with Dr. Kathlene Cote of interventional radiology and will send him there for either repeat noninvasive studies or consideration of an angiogram. If we were to consider foot conserving surgery [ray amputation] he will need to have the vascular status to heal this. He is certainly not going to consider a below-knee amputation at this point. I am hoping to get him started on oIV antibiotics through infectious disease. He had a course of HBO however the MRI I did suggest worsening  of the osteomyelitis in the left fifth metatarsal head. 1/28; have not seen this patient in a few weeks. He did not see interventional radiology about his arterial status but is scheduled to do so early next week. He also has a follow-up with Dr. Drucilla Schmidt on 2/7 with regards to osteomyelitis. Lab work showed a creatinine of 1.36 and estimated GFR of 51 sedimentation rate of 53 and a CRP of 2.8. Dr. Drucilla Schmidt discussed the issues of amputation. I have told the patient today that certainly all of the surgical options currently may become necessary if the wound deteriorates or spreads. If his arterial evaluation is satisfactory then he might be able to get away with leg conserving surgery. 2/4; the patient had his CT angiogram. In short this showed posterior tibial arteries is the only blood flow source with single-vessel runoff bilaterally. They  did not comment in osteomyelitis in the left fifth met at although they may not specifically of looked at this area. He will see Dr. Drucilla Schmidt on 2/7 for his final suggestion about antibiotics. On 2/16 or 2/18 he is going for an attempt at revascularization in the left foot I think retrograde revascularization. This may give [place] enough blood flow to heal this wound or possibly a ray amputation. The patient is definitive he would not consider a below-knee amputation for this perceivable future because of the need to be a caregiver for his wife. He is not currently on antibiotic 2/11; patient has his angiogram on 2/18. I'm hopeful that we'll be able to get more blood flow down to the involved area. He has been to see Dr. Drucilla Schmidt of infectious disease. He wants to put off antibiotics until after the interventional radiology attempt at revascularization and make a decision after that. He does have a container of linezolid at home if he starts developing cellulitis. Previous cultures showed a very resistant MRSA. The patient is absolutely opposed to any notion of the BKA. Whether he will have enough blood flow to heal a ray amputation is questionable. I don't disagree with the idea with holding antibiotics until after the angiogram next week and then making decisions after that. I told the patient his role will be to make up his mind about a ray amputation and whether he wants to see somebody [o Dr. Duda] about that. If we are able to improve blood flow to this area I think he needs antibiotics either oral IV I would leave this to infectious disease but the idea of not treating him to me is not the correct decision. We have numerous patients who have closed areas like this and admittedly not all of them maintain closure, but some of them do We will continue to use silver alginate to the wound 2/25 the patient underwent a extensive revascularization by Dr. Serafina Royals of interventional radiology. We certainly  appreciate his involvement. He underwent a laser atherectomy of the left popliteal and proximal left anterior tibial arteries followed by balloon angioplasty. He was felt to have improved patency and anterograde flow via the left popliteal and left proximal anterior tibial arteries. It was also felt that his collaterals mostly supplied by the proximal anterior tibial artery were improved notable that the posterior tibial artery is patent. The peroneal artery was diminutive in the dorsalis pedis is diminutive. It was mentioned to the patient that possible retrograde approach revascularization might be in order. He is already booked for noninvasive studies in mid March. The patient has also been to see Dr. Drucilla Schmidt with  regards to the underlying MRSA infection. Dr. Drucilla Schmidt has graciously agreed to set up IV daptomycin for the patient and he will get his PICC line on Monday. Is in the meantime his wound looks better using silver alginate. The patient is grateful to everybody involved in an attempt to get this wound to close 3/24; 1 month follow-up. The patient's wound area on the lateral part of his left foot is closed. He saw interventional radiology on 3/17. His ABI was 0.85 not much different from previously his TBI was 0.44 also not much different from previously however the condition of his foot certainly has improved and the wound has closed over. He underwent left popliteal and anterior tibial artery revascularization [laser atherectomy, balloon angioplasty]. He has significant healing of the base of the fifth metatarsal head wound with underlying osteomyelitis. He will continue with dual antiplatelet therapy. Follows up with them in 2 months. With regards to treatment for the osteomyelitis he still has a PICC line he has received daptomycin although this is been denied by his insurance I told him to call Dr. Drucilla Schmidt about this but also his insurance company and to point out that he still has his  leg Objective Constitutional Patient is hypertensive.. Pulse regular and within target range for patient.Marland Kitchen Respirations regular, non-labored and within target range.. Temperature is normal and within the target range for the patient.Marland Kitchen Appears in no distress. Vitals Time Taken: 7:57 AM, Height: 72 in, Weight: 270 lbs, BMI: 36.6, Temperature: 98.4 F, Pulse: 78 bpm, Respiratory Rate: 17 breaths/min, Blood Pressure: 180/80 mmHg, Capillary Blood Glucose: 129 mg/dl. General Notes: Wound exam; left lateral foot. The surface of this is totally closed over. There is no opening. Tissue looks somewhat vulnerable but no open wound is identified no tenderness no erythema Integumentary (Hair, Skin) Wound #5 status is Open. Original cause of wound was Gradually Appeared. The date acquired was: 10/31/2019. The wound has been in treatment 30 weeks. The wound is located on the Left Metatarsal head fifth. The wound measures 0cm length x 0cm width x 0cm depth; 0cm^2 area and 0cm^3 volume. Assessment Active Problems ICD-10 Type 2 diabetes mellitus with foot ulcer Non-pressure chronic ulcer of other part of left foot with fat layer exposed Subacute osteomyelitis, left ankle and foot Cellulitis of left lower limb Type 2 diabetes mellitus with diabetic peripheral angiopathy without gangrene Plan Discharge From [facility] Services: Discharge from [facility] - call if any future wound care needs. Off-Loading: Open toe surgical shoe to: - wear surgical shoe for 1-2 weeks then diabetic shoe. Non Wound Condition: Protect area with: - pad area of left lateral foot for protection. 1. The patient can be discharged from the clinic 2. I have recommended rigorous offloading with border foam. Also cautioned never to walk in his bare feet. He can progress off his forefoot off loader. 3. I again went over the discussion of "closed" versus healed as it applies to osteomyelitis. I told him that recurrence is certainly possible  especially in the next year. 4. Follow-up with interventional radiology in 2 months he had angioplasties, I told them that is possible these may need to be repeated Electronic Signature(s) Signed: 07/24/2020 5:22:38 PM By: Linton Ham MD Previous Signature: 07/20/2020 4:40:49 PM Version By: Linton Ham MD Entered By: Linton Ham on 07/24/2020 16:45:09 -------------------------------------------------------------------------------- SuperBill Details Patient Name: Date of Service: Martin [name], Martin [name] 07/20/2020 Medical Record Number: [id number] Patient Account Number: [id number] Date of Birth/Sex: Treating RN: 10/07/1945 (75 y.o. M) Rolin Barry, Tammi Klippel  Primary Care Provider: [name] Other Clinician: Referring Provider: Treating Provider/Extender: [name] in Treatment: 30 Diagnosis Coding ICD-10 Codes Code Description E11.621 Type 2 diabetes mellitus with foot ulcer L97.522 Non-pressure chronic ulcer of other part of left foot with fat layer exposed M86.272 Subacute osteomyelitis, left ankle and foot L03.116 Cellulitis of left lower limb E11.51 Type 2 diabetes mellitus with diabetic peripheral angiopathy without gangrene Facility Procedures CPT4 Code: [id number] Description: 99213 - WOUND CARE VISIT-LEV 3 EST PT Modifier: Quantity: 1 Physician Procedures : CPT4 Code Description Modifier [id number] - WC PHYS LEVEL 2 - EST PT ICD-10 Diagnosis Description E11.621 Type 2 diabetes mellitus with foot ulcer L97.522 Non-pressure chronic ulcer of other part of left foot with fat layer exposed M86.272 Subacute  osteomyelitis, left ankle and foot Quantity: 1 Electronic Signature(s) Signed: 07/24/2020 5:22:38 PM By: Linton Ham MD Previous Signature: 07/20/2020 4:40:49 PM Version By: Linton Ham MD Previous Signature: 07/20/2020 4:43:39 PM Version By: Deon Pilling Entered By: Linton Ham on 07/24/2020 16:45:34

## 2020-07-24 ENCOUNTER — Encounter: Payer: Self-pay | Admitting: Infectious Disease

## 2020-07-24 DIAGNOSIS — M86272 Subacute osteomyelitis, left ankle and foot: Secondary | ICD-10-CM | POA: Diagnosis not present

## 2020-07-26 NOTE — Progress Notes (Addendum)
ELMORE, HYSLOP ([id number]) Visit Report for 07/20/2020 Arrival Information Details Patient Name: Date of Service: ANDEN, BARTOLO 07/20/2020 8:00 A M Medical Record Number: [id number] Patient Account Number: [id number] Date of Birth/Sex: Treating RN: 12/14/1945 (75 y.o. [name], [name] Primary Care Cambell Stanek: [name] Other Clinician: Referring Leigh Kaeding: Treating Barnaby Rippeon/Extender: [name] in Treatment: 38 Visit Information History Since Last Visit Added or deleted any medications: No Patient Arrived: [name] Any new allergies or adverse reactions: No Arrival Time: 07:56 Had a fall or experienced change in No Accompanied By: self activities of daily living that may affect Transfer Assistance: None risk of falls: Patient Identification Verified: Yes Signs or symptoms of abuse/neglect since last visito No Secondary Verification Process Completed: Yes Hospitalized since last visit: No Patient Requires Transmission-Based Precautions: No Implantable device outside of the clinic excluding No Patient Has Alerts: No cellular tissue based products placed in the center since last visit: Has Dressing in Place as Prescribed: Yes Pain Present Now: No Electronic Signature(s) Signed: 07/20/2020 5:01:28 PM By: Rhae Hammock RN Entered By: Rhae Hammock on 07/20/2020 07:57:03 -------------------------------------------------------------------------------- Clinic Level of Care Assessment Details Patient Name: Date of Service: PARK, BECK 07/20/2020 8:00 A M Medical Record Number: [id number] Patient Account Number: [id number] Date of Birth/Sex: Treating RN: 21-Feb-1946 (75 y.o. [name], [name] Primary Care Aylah Yeary: [name] Other Clinician: Referring Rogena Deupree: Treating Corlette Ciano/Extender: [name] in Treatment: 30 Clinic Level of Care Assessment Items TOOL 4 Quantity Score X- 1 0 Use when only an EandM  is performed on FOLLOW-UP visit ASSESSMENTS - Nursing Assessment / Reassessment X- 1 10 Reassessment of Co-morbidities (includes updates in patient status) X- 1 5 Reassessment of Adherence to Treatment Plan ASSESSMENTS - Wound and Skin A ssessment / Reassessment X - Simple Wound Assessment / Reassessment - one wound 1 5 []  - 0 Complex Wound Assessment / Reassessment - multiple wounds X- 1 10 Dermatologic / Skin Assessment (not related to wound area) ASSESSMENTS - Focused Assessment X- 1 5 Circumferential Edema Measurements - multi extremities X- 1 10 Nutritional Assessment / Counseling / Intervention []  - 0 Lower Extremity Assessment (monofilament, tuning fork, pulses) []  - 0 Peripheral Arterial Disease Assessment (using hand held doppler) ASSESSMENTS - Ostomy and/or Continence Assessment and Care []  - 0 Incontinence Assessment and Management []  - 0 Ostomy Care Assessment and Management (repouching, etc.) PROCESS - Coordination of Care X - Simple Patient / Family Education for ongoing care 1 15 []  - 0 Complex (extensive) Patient / Family Education for ongoing care X- 1 10 Staff obtains Consents, Records, T Results / Process Orders est []  - 0 Staff telephones HHA, Nursing Homes / Clarify orders / etc []  - 0 Routine Transfer to another Facility (non-emergent condition) []  - 0 Routine Hospital Admission (non-emergent condition) []  - 0 New Admissions / Biomedical engineer / Ordering NPWT Apligraf, etc. , []  - 0 Emergency Hospital Admission (emergent condition) X- 1 10 Simple Discharge Coordination []  - 0 Complex (extensive) Discharge Coordination PROCESS - Special Needs []  - 0 Pediatric / Minor Patient Management []  - 0 Isolation Patient Management []  - 0 Hearing / Language / Visual special needs []  - 0 Assessment of Community assistance (transportation, D/C planning, etc.) []  - 0 Additional assistance / Altered mentation []  - 0 Support Surface(s)  Assessment (bed, cushion, seat, etc.) INTERVENTIONS - Wound Cleansing / Measurement X - Simple Wound Cleansing - one wound 1 5 []  - 0 Complex Wound Cleansing - multiple wounds X-  1 5 Wound Imaging (photographs - any number of wounds) []  - 0 Wound Tracing (instead of photographs) X- 1 5 Simple Wound Measurement - one wound []  - 0 Complex Wound Measurement - multiple wounds INTERVENTIONS - Wound Dressings X - Small Wound Dressing one or multiple wounds 1 10 []  - 0 Medium Wound Dressing one or multiple wounds []  - 0 Large Wound Dressing one or multiple wounds []  - 0 Application of Medications - topical []  - 0 Application of Medications - injection INTERVENTIONS - Miscellaneous []  - 0 External ear exam []  - 0 Specimen Collection (cultures, biopsies, blood, body fluids, etc.) []  - 0 Specimen(s) / Culture(s) sent or taken to Lab for analysis []  - 0 Patient Transfer (multiple staff / Civil Service fast streamer / Similar devices) []  - 0 Simple Staple / Suture removal (25 or less) []  - 0 Complex Staple / Suture removal (26 or more) []  - 0 Hypo / Hyperglycemic Management (close monitor of Blood Glucose) []  - 0 Ankle / Brachial Index (ABI) - do not check if billed separately X- 1 5 Vital Signs Has the patient been seen at the hospital within the last three years: Yes Total Score: 110 Level Of Care: New/Established - Level 3 Electronic Signature(s) Signed: 07/20/2020 4:43:39 PM By: Deon Pilling Entered By: Deon Pilling on 07/20/2020 08:30:25 -------------------------------------------------------------------------------- Encounter Discharge Information Details Patient Name: Date of Service: Martin [name]. 07/20/2020 8:00 A M Medical Record Number: [id number] Patient Account Number: [id number] Date of Birth/Sex: Treating RN: [birth year] (75 y.o. [name], [name] Primary Care Alexxander Kurt: [name] Other Clinician: Referring Terilynn Buresh: Treating Anastacio Bua/Extender: [name] in Treatment: 30 Encounter Discharge Information Items Discharge Condition: Stable Ambulatory Status: Cane Discharge Destination: Home Transportation: Private Auto Accompanied By: self Schedule Follow-up Appointment: Yes Clinical Summary of Care: Patient Declined Electronic Signature(s) Signed: 07/20/2020 5:01:28 PM By: Rhae Hammock RN Entered By: Rhae Hammock on 07/20/2020 08:49:25 -------------------------------------------------------------------------------- Lower Extremity Assessment Details Patient Name: Date of Service: Martin [name]. 07/20/2020 8:00 A M Medical Record Number: [id number] Patient Account Number: [id number] Date of Birth/Sex: Treating RN: 29-Jul-1945 (75 y.o. [name], [name] Primary Care Lezli Danek: [name] Other Clinician: Referring Deloise Marchant: Treating Tangela Dolliver/Extender: [name] in Treatment: 30 Edema Assessment Assessed: [name]: Yes] [name]: No] Edema: [Left: Ye] [Right: s] Calf Left: Right: Point of Measurement: 32 cm From Medial Instep 40 cm Ankle Left: Right: Point of Measurement: 11 cm From Medial Instep 27 cm Vascular Assessment Pulses: Dorsalis Pedis Palpable: [Left:Yes] Posterior Tibial Palpable: [Left:Yes] Electronic Signature(s) Signed: 07/20/2020 5:01:28 PM By: Rhae Hammock RN Entered By: Rhae Hammock on 07/20/2020 08:08:08 -------------------------------------------------------------------------------- Multi Wound Chart Details Patient Name: Date of Service: Martin [name]. 07/20/2020 8:00 A M Medical Record Number: [id number] Patient Account Number: [id number] Date of Birth/Sex: Treating RN: 1945/10/05 (75 y.o. [name], [name] Primary Care Arrington Yohe: [name] Other Clinician: Referring Kiarah Eckstein: Treating Olliver Boyadjian/Extender: [name] in Treatment: 30 Vital Signs Height(in): 72 Capillary Blood  Glucose(mg/dl): 129 Weight(lbs): 270 Pulse(bpm): 86 Body Mass Index(BMI): 37 Blood Pressure(mmHg): 180/80 Temperature(F): 98.4 Respiratory Rate(breaths/min): 17 Photos: [5:No Photos Left Metatarsal head fifth] [N/A:N/A N/A] Wound Location: [5:Gradually Appeared] [N/A:N/A] Wounding Event: [5:Diabetic Wound/Ulcer of the Lower] [N/A:N/A] Primary Etiology: [5:Extremity 10/31/2019] [N/A:N/A] Date Acquired: [5:30] [N/A:N/A] Weeks of Treatment: [5:Open] [N/A:N/A] Wound Status: [5:0x0x0] [N/A:N/A] Measurements L x W x D (cm) [5:0] [N/A:N/A] A (cm) : rea [5:0] [N/A:N/A] Volume (cm) : [5:100.00%] [N/A:N/A] % Reduction in Area: [5:100.00%] [N/A:N/A] % Reduction in Volume: [  5:Grade 3] [N/A:N/A] Treatment Notes Electronic Signature(s) Signed: 07/20/2020 4:40:49 PM By: Linton Ham MD Signed: 07/20/2020 4:43:39 PM By: Deon Pilling Entered By: Linton Ham on 07/20/2020 08:29:46 -------------------------------------------------------------------------------- Multi-Disciplinary Care Plan Details Patient Name: Date of Service: Martin [name]. 07/20/2020 8:00 A M Medical Record Number: [id number] Patient Account Number: [id number] Date of Birth/Sex: Treating RN: July 29, 1945 (75 y.o. [name] Primary Care Ahava Kissoon: [name] Other Clinician: Referring Chalsey Leeth: Treating Graycen Degan/Extender: [name] in Treatment: [place] reviewed with physician Active Inactive Electronic Signature(s) Signed: 08/25/2020 7:17:47 AM By: Deon Pilling Previous Signature: 07/20/2020 4:43:39 PM Version By: Deon Pilling Entered By: Deon Pilling on 08/25/2020 07:17:47 -------------------------------------------------------------------------------- Pain Assessment Details Patient Name: Date of Service: CRISTOPHER, CICCARELLI 07/20/2020 8:00 A M Medical Record Number: [id number] Patient Account Number: [id number] Date of Birth/Sex: Treating  RN: Aug 28, 1945 (75 y.o. Erie Noe Primary Care Davionne Dowty: [name] Other Clinician: Referring Hailey Miles: Treating Dawnya Grams/Extender: [name] in Treatment: 30 Active Problems Location of Pain Severity and Description of Pain Patient Has Paino No Site Locations Rate the pain. Current Pain Level: 0 Pain Management and Medication Current Pain Management: Electronic Signature(s) Signed: 07/20/2020 5:01:28 PM By: Rhae Hammock RN Entered By: Rhae Hammock on 07/20/2020 07:58:54 -------------------------------------------------------------------------------- Patient/Caregiver Education Details Patient Name: Date of Service: Dea, Traivon W. 3/24/2022andnbsp8:00 [place] Record Number: [id number] Patient Account Number: [id number] Date of Birth/Gender: Treating RN: [birth year] (75 y.o. [name] Primary Care Physician: [name] Other Clinician: Referring Physician: Treating Physician/Extender: [name] in Treatment: 16 Education Assessment Education Provided To: Patient Education Topics Provided Wound/Skin Impairment: Handouts: Skin Care Do's and Dont's Methods: Explain/Verbal Responses: Reinforcements needed Electronic Signature(s) Signed: 07/20/2020 4:43:39 PM By: Deon Pilling Entered By: Deon Pilling on 07/20/2020 08:04:19 -------------------------------------------------------------------------------- Wound Assessment Details Patient Name: Date of Service: KESHAV, WINEGAR 07/20/2020 8:00 A M Medical Record Number: [id number] Patient Account Number: [id number] Date of Birth/Sex: Treating RN: 1945/06/21 (76 y.o. 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 History: Diabetes, Neuropathy Clustered Wound: No Photos Wound Measurements Length: (cm) Width: (cm) Depth: (cm) Area: (cm) Volume: (cm) 0 % Reduction in Area: 100% 0 % Reduction in Volume: 100% 0 Epithelialization: None 0 0 Wound Description Classification: Grade 3 Wound Margin: Well defined, not attached Exudate Amount: Medium Exudate Type: Serosanguineous Exudate Color: red, brown Wound Bed Granulation Amount: Large (67-100%) Granulation Quality: Red, Friable Necrotic Amount: Small (1-33%) Necrotic Quality: Adherent Slough Foul Odor After Cleansing: No Slough/Fibrino Yes Exposed Structure Fascia Exposed: No Fat Layer (Subcutaneous Tissue) Exposed: Yes Tendon Exposed: No Muscle Exposed: No Joint Exposed: No Bone Exposed: No Electronic Signature(s) Signed: 07/20/2020 5:01:28 PM By: Rhae Hammock RN Signed: 07/26/2020 9:05:34 AM By: Sandre Kitty Entered By: Sandre Kitty on 07/20/2020 16:59:22 -------------------------------------------------------------------------------- Vitals Details Patient Name: Date of Service: Martin [name]. 07/20/2020 8:00 A M Medical Record Number: [id number] Patient Account Number: [id number] Date of Birth/Sex: Treating RN: 07-22-1945 (75 y.o. Erie Noe Primary Care Daryana Whirley: [name] Other Clinician: Referring Najeh Credit: Treating Maddie Brazier/Extender: [name] in Treatment: 30 Vital Signs Time Taken: 07:57 Temperature (F): 98.4 Height (in):  72 Pulse (bpm): 78 Weight (lbs): 270 Respiratory Rate (breaths/min): 17 Body Mass Index (BMI): 36.6 Blood Pressure (mmHg): 180/80 Capillary Blood Glucose (mg/dl): 129 Reference  Range: 80 - 120 mg / dl Electronic Signature(s) Signed: 07/20/2020 5:01:28 PM By: Rhae Hammock RN Entered By: Rhae Hammock on 07/20/2020 07:57:52

## 2020-07-27 DIAGNOSIS — M86272 Subacute osteomyelitis, left ankle and foot: Secondary | ICD-10-CM | POA: Diagnosis not present

## 2020-07-29 DIAGNOSIS — M86272 Subacute osteomyelitis, left ankle and foot: Secondary | ICD-10-CM | POA: Diagnosis not present

## 2020-07-31 DIAGNOSIS — M86272 Subacute osteomyelitis, left ankle and foot: Secondary | ICD-10-CM | POA: Diagnosis not present

## 2020-08-02 ENCOUNTER — Ambulatory Visit: Payer: PPO | Admitting: Infectious Disease

## 2020-08-02 ENCOUNTER — Other Ambulatory Visit: Payer: Self-pay

## 2020-08-02 VITALS — BP 183/84 | HR 70 | Temp 98.2°F | Ht 72.0 in | Wt 264.0 lb

## 2020-08-02 DIAGNOSIS — E1159 Type 2 diabetes mellitus with other circulatory complications: Secondary | ICD-10-CM

## 2020-08-02 DIAGNOSIS — I739 Peripheral vascular disease, unspecified: Secondary | ICD-10-CM

## 2020-08-02 DIAGNOSIS — A0472 Enterocolitis due to Clostridium difficile, not specified as recurrent: Secondary | ICD-10-CM

## 2020-08-02 DIAGNOSIS — Z951 Presence of aortocoronary bypass graft: Secondary | ICD-10-CM | POA: Diagnosis not present

## 2020-08-02 DIAGNOSIS — I152 Hypertension secondary to endocrine disorders: Secondary | ICD-10-CM | POA: Diagnosis not present

## 2020-08-02 DIAGNOSIS — M869 Osteomyelitis, unspecified: Secondary | ICD-10-CM | POA: Diagnosis not present

## 2020-08-02 NOTE — Progress Notes (Signed)
Subjective:  Chief complaint follow-up for diabetic foot infection   Patient ID: Martin Agee Sr., male    DOB: [birth year], 75 y.o.   MRN: [id number]  HPI  Martin [name] is a 75 year old Caucasian man with a past medical history significant for coronary artery disease status post coronary bypass grafting, diabetes mellitus peripheral artery disease who has been struggling with an ulcer in his foot for several years now.   Unfortunately his developed osteomyelitis. He was referred to Dr. Sharol Given who offered him surgery but the patient was not ready to do this yet.  There is also concern about his ability to heal any surgery in his foot due to his peripheral vascular disease.  He may in fact require a proximal amputation such as a below the knee amputation for cure.  At present he is in a very difficult situation socially and that he is caring for his wife and is the sole caregiver. His wife has metastatic breast cancer that metastasized to bone and she is currently bedbound.  He is not ready to have any type of surgery including surgery on the foot at this point in time but wants to see his wife improving first over more resolution on not from prior to allowing himself to be hospitalized for a surgical intervention.  He has been followed closely by the wound care clinic and Dr. Dellia Nims who gave him a prescription for Zyvox when the patient developed erythema that went up his leg and swelling.  He responded well to Zyvox he also had had DVT excluded through duplex.  He has had MRSA isolated from cultures from the wound center which has been resistant to tetracycline clindamycin fluoroquinolones but sensitive to Bactrim and vancomycin  The erythema in his leghaddown considerably since he is received his course of Zyvox  I observe him off antibiotics but there was a great desire his part on the prior Dr. Dellia Nims to see what systemic antibiotics would do.  Therefore we started IV  daptomycin which she has been on and is supposed to continue on through May 75.  He states that after he had revascularization and initiated daptomycin that he had dramatic improvement in his wound which is now closed.  He said it improved within a matter of 2 weeks.  Did injure his toenail which is wrapped today in which he is going to see podiatry for. .  Past Medical History:  Diagnosis Date  . A-fib ([place])   . Diabetes mellitus   . Heart attack ([place]) 10/01/2018   Pt had open heart surgery  . Hypertension   . 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 Simple chronic bronchitis ([place]) 05/08/2020  . Tobacco abuse     Past Surgical History:  Procedure Laterality Date  . CORONARY ARTERY BYPASS GRAFT N/A 10/01/2018   Procedure: CORONARY ARTERY BYPASS GRAFTING (CABG) TIMES  FOUR USING LEFT MAMMARY ARTERY AND RIGHT GREATER SAPHEANOUS VEIN HARVESTED ENDOSCOPICALLY;  Surgeon: Melrose Nakayama, MD;  Location: [facility];  Service: Open Heart Surgery;  Laterality: N/A;  . HERNIA REPAIR     >10 years ago  . IR ANGIOGRAM EXTREMITY LEFT  06/16/2020  . IR RADIOLOGIST EVAL & MGMT  12/24/2016  . IR RADIOLOGIST EVAL & MGMT  05/30/2020  . IR RADIOLOGIST EVAL & MGMT  07/13/2020  . IR THORACENTESIS ASP PLEURAL SPACE W/IMG GUIDE  10/06/2018  . IR US GUIDE VASC ACCESS RIGHT  06/16/2020  . LEFT HEART CATH AND  CORONARY ANGIOGRAPHY N/A 09/29/2018   Procedure: LEFT HEART CATH AND CORONARY ANGIOGRAPHY;  Surgeon: Troy Sine, MD;  Location: [facility];  Service: Cardiovascular;  Laterality: N/A;  . open heart surfery    . right shoulder surgery     around 2014  . TEE WITHOUT CARDIOVERSION N/A 10/01/2018   Procedure: TRANSESOPHAGEAL ECHOCARDIOGRAM (TEE);  Surgeon: Melrose Nakayama, MD;  Location: [facility];  Service: Open Heart Surgery;  Laterality: N/A;    Family History  Problem Relation Age of Onset  . Hypertension Mother   . Heart disease Mother        CHF did not see doctor  .  Diabetes Maternal Grandmother   . Diabetes Son       Social History   Socioeconomic History  . Marital status: Married    Spouse name: Not on file  . Number of children: Not on file  . Years of education: Not on file  . Highest education level: Not on file  Occupational History  . Not on file  Tobacco Use  . Smoking status: Former Smoker    Packs/day: 0.75    Years: 2.00    Pack years: 1.50    Types: Cigarettes    Start date: 2002    Quit date: 04/29/2002    Years since quitting: 18.2  . Smokeless tobacco: Never Used  . Tobacco comment: smoked for 10 years on and off  Substance and Sexual Activity  . Alcohol use: Yes    Alcohol/week: 0.0 standard drinks    Comment: 6 martinis a year  . Drug use: No  . Sexual activity: Yes  Other Topics Concern  . Not on file  Social History Narrative   Married. 3 kids. 7 grandkids. No greatgrandkids.       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 Simvastatin Diarrhea  . Statins Other (See Comments)    achey joints  . Xanax [Alprazolam] Anxiety     Current Outpatient Medications:  .  aspirin 81 MG EC tablet, Take 1 tablet (81 mg total) by mouth daily., Disp: , Rfl:  .  clopidogrel (PLAVIX) 75 MG tablet, Take 1 tablet (75 mg total) by mouth daily., Disp: 90 tablet, Rfl: 0 .  DAPTOmycin 350 MG SOLR, Inject into the vein., Disp: , Rfl:  .  ezetimibe (ZETIA) 10 MG tablet, TAKE 1 TABLET BY MOUTH EVERY DAY (Patient taking differently: Take 10 mg by mouth daily.), Disp: 90 tablet, Rfl: 0 .  fexofenadine (ALLEGRA) 180 MG tablet, Take 180 mg by mouth daily as needed for allergies or rhinitis., Disp: , Rfl:   .  fluticasone (FLONASE) 50 MCG/ACT nasal spray, Place 2 sprays into both nostrils daily as needed for allergies or rhinitis., Disp: , Rfl:  .  insulin aspart (NOVOLOG FLEXPEN) 100 UNIT/ML FlexPen, 3 times a day (just before each meal) 25-20-25 units, and pen needles 4/day (Patient taking differently: Inject 20-25 Units into the skin See admin instructions. 3 times a day (just before each meal) 25-20-25 units, and pen needles 4/day), Disp: 25 pen, Rfl: 3 .  insulin glargine (LANTUS SOLOSTAR) 100  UNIT/ML Solostar Pen, Inject 10 Units into the skin at bedtime., Disp: 5 pen, Rfl: PRN .  Insulin Pen Needle (NOVOFINE) 32G X 6 MM MISC, USE 5 TIMES DAILY, Disp: 450 each, Rfl: 2 .  linezolid (ZYVOX) 600 MG tablet, Take 1 tablet (600 mg total) by mouth 2 (two) times daily. To be taken at first sign of cellulitis around foot (Patient taking differently: Take 600 mg by mouth 2 (two) times daily as needed. To be taken at first sign of cellulitis around foot), Disp: 28 tablet, Rfl: 3 .  LIVALO 1 MG TABS, TAKE 1 TABLET BY MOUTH EVERY DAY (Patient taking differently: Take 1 mg by mouth daily.), Disp: 90 tablet, Rfl: 3 .  losartan (COZAAR) 25 MG tablet, Take 0.5 tablets (12.5 mg total) by mouth daily., Disp: 45 tablet, Rfl: 3 .  metoprolol tartrate (LOPRESSOR) 50 MG tablet, TAKE 1 TABLET BY MOUTH TWICE A DAY (Patient taking differently: Take 50 mg by mouth 2 (two) times daily.), Disp: 180 tablet, Rfl: 1 .  ONETOUCH VERIO test strip, CHECK LEVELS 4 TIMES DAILY, Disp: 150 strip, Rfl: 1 .  torsemide (DEMADEX) 20 MG tablet, Take 20 mg by mouth daily., Disp: , Rfl:  .  vitamin C (ASCORBIC ACID) 250 MG tablet, Take 250 mg by mouth daily., Disp: , Rfl:    Review of Systems  Constitutional: Negative for activity change, appetite change, chills, diaphoresis, fatigue, fever and unexpected weight change.  HENT: Negative for congestion, rhinorrhea, sinus pressure, sneezing, sore throat and trouble swallowing.   Eyes: Negative  for photophobia and visual disturbance.  Respiratory: Negative for cough, chest tightness, shortness of breath, wheezing and stridor.   Cardiovascular: Negative for chest pain, palpitations and leg swelling.  Gastrointestinal: Negative for abdominal distention, abdominal pain, anal bleeding, blood in stool, constipation, diarrhea, nausea and vomiting.  Genitourinary: Negative for difficulty urinating, dysuria, flank pain and hematuria.  Musculoskeletal: Negative for arthralgias, back pain, gait problem, joint swelling and myalgias.  Skin: Positive for wound. Negative for color change, pallor and rash.  Neurological: Negative for dizziness, tremors, weakness and light-headedness.  Hematological: Negative for adenopathy. Does not bruise/bleed easily.  Psychiatric/Behavioral: Negative for agitation, behavioral problems, confusion, decreased concentration, dysphoric mood and sleep disturbance.       Objective:   Physical Exam Constitutional:      Appearance: He is well-developed.  HENT:     Head: Normocephalic and atraumatic.  Eyes:     Extraocular Movements: Extraocular movements intact.     Conjunctiva/sclera: Conjunctivae normal.  Cardiovascular:     Rate and Rhythm: Normal rate and regular rhythm.  Pulmonary:     Effort: Pulmonary effort is normal. No respiratory distress.     Breath sounds: No wheezing.  Abdominal:     General: There is no distension.     Palpations: Abdomen is soft.  Musculoskeletal:        General: No tenderness. Normal range of motion.     Cervical back: Normal range of motion and neck supple.  Skin:    General: Skin is warm and dry.     Coloration: Skin is not pale.     Findings: No erythema or rash.  Neurological:     General: No focal deficit present.     Mental Status: He is alert and oriented to person, place, and time.  Psychiatric:        Mood and Affect: Mood normal.        Behavior: Behavior normal.  Thought Content: Thought content  normal.        Judgment: Judgment normal.    Foot   06/05/2020:        06/16/2020:       08/02/2020:     PICC line 08/02/2020:        Assessment & Plan:  Osteomyelitis of the foot with peripheral vascular disease status post revascularization and daptomycin continues on.   I will continue daptomycin and plan on seeing him on Aug 29, 2020.  While his wound is healed up I am skeptical that we are going to build to cure his actual osteomyelitis and will contemplate trying to place him on oral suppressive antibiotic.  Bactrim is active against his microbe but he does have some chronic kidney disease so that we try this drug would need to be very careful about the dosage.  He responded to linezolid but that is not a good long-term option  I spent greater than 40 minutes with the patient including greater than 50% of time in face to face counsel of the patient re the nature of osteomyelitis, MRSA infections and in coordination of his care.

## 2020-08-04 DIAGNOSIS — M86272 Subacute osteomyelitis, left ankle and foot: Secondary | ICD-10-CM | POA: Diagnosis not present

## 2020-08-04 NOTE — Addendum Note (Signed)
Encounter addended by: Zamzam Whinery H on: 08/04/2020 2:57 PM  Actions taken: Imaging Exam ended

## 2020-08-07 ENCOUNTER — Encounter: Payer: Self-pay | Admitting: Infectious Disease

## 2020-08-07 DIAGNOSIS — M86272 Subacute osteomyelitis, left ankle and foot: Secondary | ICD-10-CM | POA: Diagnosis not present

## 2020-08-11 DIAGNOSIS — M86272 Subacute osteomyelitis, left ankle and foot: Secondary | ICD-10-CM | POA: Diagnosis not present

## 2020-08-14 NOTE — Patient Instructions (Addendum)
Please stop by lab before you go If you have mychart- we will send your results within 3 business days of [place] receiving them.  If you do not have mychart- we will call you about results within 5 business days of [place] receiving them.  *please also note that you will see labs on mychart as soon as they post. I will later go in and write notes on them- will say "notes from Dr. Yong Channel"  Add on astelin for nose- we can refer you to allergist if expensive or not covered by insurance  blood pressure poorly controlled in the mornings in particular- we are going to increase losartan to 25 mg twice a day instead of 25 mg in the morning alone and asked him to update me with home readings in 3 weeks both and afternoon - keep an eye on blood pressure when you stop the daptomycin and keep me updated -also advised a dose of torsemide for increased swelling  Please call today to schedule endocrinology follow up  Vaccines you can consider/due for at pharmacy 1. shingrix (for shingles) 2. covid 19 vaccine #4 3. prevnar 20- new pneumonia shot  Health Maintenance Due  Topic Date Due  . OPHTHALMOLOGY EXAM Scheduled 08/24/2020- please have them send [place] a copy 08/05/2020   Try to submit cologuard or we can try to get you in for colonoscopy when things are more clear with the foot  Recommended follow up: Return in about 4 months (around 12/16/2020) for follow up- or sooner if needed. 3 week blood pressure update

## 2020-08-14 NOTE — Progress Notes (Addendum)
Phone: [phone]   Subjective:  Patient presents today for their annual physical. Chief complaint-noted.   See problem oriented charting- ROS- full  review of systems was completed and negative  except for: congestion, post nasal drip, runny nose, sneezing, tinnitus, light sensitivity, visual issues- follows with optho, seasonal allergies, cough  The following were reviewed and entered/updated in epic: Past Medical History:  Diagnosis Date  . A-fib ([place])   . Diabetes mellitus   . Heart attack ([place]) 10/01/2018   Pt had open heart surgery  . Hypertension   . 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 Simple chronic bronchitis ([place]) 05/08/2020  . Tobacco abuse    Patient Active Problem List   Diagnosis Date Noted  . Postoperative atrial fibrillation ([place]) 10/24/2018    Priority: High  . 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 Undiagnosed cardiac murmurs 08/15/2015    Priority: High  . DM (diabetes mellitus) type II uncontrolled with eye manifestation ([place]) 02/03/2007    Priority: High  . 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 PAD (peripheral artery disease) ([place]) 08/20/2017    Priority: Medium  . Partial tear of right Achilles tendon 02/12/2017    Priority: Medium  . Aortic atherosclerosis ([place]) 10/07/2016    Priority: Medium  . History of skin cancer 08/15/2015    Priority: Medium  . Diabetic retinopathy ([place]) 02/21/2015    Priority: Medium  . Hyperlipidemia associated with type 2 diabetes mellitus ([place]) 08/24/2014    Priority: Medium  . Diabetic polyneuropathy ([place]) 04/11/2009    Priority: Medium  . Hypertension associated with diabetes ([place]) 02/03/2007    Priority: Medium  . Acute blood loss as cause of postoperative anemia 10/24/2018    Priority: Low  . BPH (benign prostatic hyperplasia) 10/24/2018    Priority: Low  . History of depression 10/24/2018    Priority: Low  . S/P CABG x 4  10/01/2018    Priority: Low  . Onychomycosis of toenail 02/17/2018    Priority: Low  . Allergic sinusitis 08/20/2017    Priority: Low  . C. difficile colitis 09/25/2016    Priority: Low  . History of aspiration pneumonia 09/25/2016    Priority: Low  . Former smoker 08/24/2014    Priority: Low  . Ingrowing toenail 07/19/2014    Priority: Low  . BACK PAIN, LUMBAR, WITH RADICULOPATHY 08/30/2008    Priority: Low  . 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 Simple chronic bronchitis ([place]) 05/08/2020   Past Surgical History:  Procedure Laterality Date  . CORONARY ARTERY BYPASS GRAFT N/A 10/01/2018   Procedure: CORONARY ARTERY BYPASS GRAFTING (CABG) TIMES  FOUR USING LEFT MAMMARY ARTERY AND RIGHT GREATER SAPHEANOUS VEIN HARVESTED ENDOSCOPICALLY;  Surgeon: Melrose Nakayama, MD;  Location: [facility];  Service: Open Heart Surgery;  Laterality: N/A;  . HERNIA REPAIR     >10 years ago  . IR ANGIOGRAM EXTREMITY LEFT  06/16/2020  . IR RADIOLOGIST EVAL & MGMT  12/24/2016  . IR RADIOLOGIST EVAL & MGMT  05/30/2020  . IR RADIOLOGIST EVAL & MGMT  07/13/2020  . IR THORACENTESIS ASP PLEURAL SPACE W/IMG GUIDE  10/06/2018  . IR US GUIDE VASC ACCESS RIGHT  06/16/2020  . LEFT HEART CATH AND CORONARY ANGIOGRAPHY N/A 09/29/2018   Procedure: LEFT HEART CATH AND CORONARY ANGIOGRAPHY;  Surgeon: Troy Sine, MD;  Location: [facility];  Service: Cardiovascular;  Laterality: N/A;  . open heart surfery    . right shoulder surgery     around 2014  . TEE WITHOUT CARDIOVERSION N/A 10/01/2018   Procedure: TRANSESOPHAGEAL ECHOCARDIOGRAM (TEE);  Surgeon: Melrose Nakayama, MD;  Location: [facility];  Service: Open Heart Surgery;  Laterality: N/A;    Family History  Problem Relation Age of Onset  . Hypertension Mother   . Heart disease Mother        CHF did not see doctor  . Diabetes Maternal Grandmother   . Diabetes Son     Medications- reviewed and updated Current Outpatient Medications   Medication Sig Dispense Refill  . aspirin 81 MG EC tablet Take 1 tablet (81 mg total) by mouth daily.    Marland Kitchen azelastine (ASTELIN) 0.1 % nasal spray Place 1 spray into both nostrils 2 (two) times daily. 30 mL 12  . clopidogrel (PLAVIX) 75 MG tablet Take 1 tablet (75 mg total) by mouth daily. 90 tablet 0  . DAPTOmycin 350 MG SOLR Inject into the vein.    Marland Kitchen ezetimibe (ZETIA) 10 MG tablet TAKE 1 TABLET BY MOUTH EVERY DAY (Patient taking differently: Take 10 mg by mouth daily.) 90 tablet 0  . fexofenadine (ALLEGRA) 180 MG tablet Take 180 mg by mouth daily as needed for allergies or rhinitis.    . fluticasone (FLONASE) 50 MCG/ACT nasal spray Place 2 sprays into both nostrils daily as needed for allergies or rhinitis.    Marland Kitchen insulin aspart (NOVOLOG FLEXPEN) 100 UNIT/ML FlexPen 3 times a day (just before each meal) 25-20-25 units, and pen needles 4/day (Patient taking differently: Inject 20-25 Units into the skin See admin instructions. 3 times a day (just before each meal) 25-20-25 units, and pen needles 4/day) 25 pen 3  . insulin glargine (LANTUS SOLOSTAR) 100 UNIT/ML Solostar Pen Inject 10 Units into the skin at bedtime. 5 pen PRN  . Insulin Pen Needle (NOVOFINE) 32G X 6 MM MISC USE 5 TIMES DAILY 450 each 2  . LIVALO 1 MG TABS TAKE 1 TABLET BY MOUTH EVERY DAY (Patient taking differently: Take 1 mg by mouth daily.) 90 tablet 3  . metoprolol tartrate (LOPRESSOR) 50 MG tablet TAKE 1 TABLET BY MOUTH TWICE A DAY (Patient taking differently: Take 50 mg by mouth 2 (two) times daily.) 180 tablet 1  . ONETOUCH VERIO test strip CHECK LEVELS 4 TIMES DAILY 150 strip 1  . vitamin C (ASCORBIC ACID) 250 MG tablet Take 250 mg by mouth daily.    Marland Kitchen linezolid (ZYVOX) 600 MG tablet Take 1 tablet (600 mg total) by mouth 2 (two) times daily. To be taken at first sign of cellulitis around foot (Patient taking differently: Take 600 mg by mouth 2 (two) times daily as needed. To be taken at first sign of cellulitis around foot) 28  tablet 3  . losartan (COZAAR) 25 MG tablet Take 1 tablet (25 mg total) by mouth in the morning and at bedtime. 180 tablet 3  . torsemide (DEMADEX) 20 MG tablet Take 20 mg by mouth daily. (Patient not taking: Reported on 08/16/2020)     No current facility-administered medications for this visit.    Allergies-reviewed and updated Allergies  Allergen Reactions  . Simvastatin Diarrhea  . Statins Other (See Comments)    achey joints  . Xanax [Alprazolam] Anxiety    Social History   Social History Narrative   Married. 3 kids. 7 grandkids. No greatgrandkids.       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 Oropharynx normal. Nasal turbinates erythematous and swollen Neck: no thyromegaly or cervical lympahdenopathy CV: faint crackles at lung bases Lungs: CTAB no crackles, wheeze, rhonchi Abdomen: soft/nontender/nondistended/normal bowel sounds. No rebound or guarding.  Ext: 1+ edema on left (foot with osteo) and trace to 1+ on the right Skin: warm, dry Neuro: grossly normal, moves all extremities, PERRLA   Diabetic Foot Exam - Simple   Simple Foot Form Visual Inspection See comments: Yes Sensation Testing See comments: Yes Pulse Check See comments: Yes Comments Walks in post op shoe. Left foot bandaged per recommendations in distal toes- patient reports wound has cleared (close follow up with ID). Onychomycosis right great toe noted. Monofilament not felt until midfoot plantar surface- otherwise normal- bilaterally . 1+ pulses left foot DP and PT, stronger on right foot        Assessment and Plan  75 y.o. male presenting for annual physical.  Health Maintenance  counseling: 1. Anticipatory guidance: Patient counseled regarding regular dental exams - advised q6 months- last 9 months ago, eye exams - yearly and scheduled soon,  avoiding smoking and second hand smoke , limiting alcohol to 2 beverages per day - not drinking at moment, no illicit drugs.   2. Risk factor reduction:  Advised patient of need for regular exercise and diet rich and fruits and vegetables to reduce risk of heart attack and stroke. Exercise- limited by foot issues- could try seated exercises- some options on youtube. Diet-  Weight up 12 pounds from last physical-osteomyelitis contributing to decreased activity levels, some could be fluid as well.  Discussed reducing calories to try to help lose some weight- could cut down on fast food Wt Readings from Last 3 Encounters:  08/16/20 263 lb 6.4 oz (119.5 kg)  08/02/20 264 lb (119.7 kg)  07/03/20 263 lb (119.3 kg)  3. Immunizations/screenings/ancillary studies-discussed Shingrix, discussed covid-19 vaccination #4, discussed Prevnar 20  Immunization History  Administered Date(s) Administered  . Fluad Quad(high Dose 65+) 01/06/2019, 02/09/2020  . Influenza Split 02/06/2011, 01/28/2012  . Influenza Whole 02/08/2010  . Influenza, High Dose Seasonal PF 02/14/2016, 02/12/2017, 02/17/2018  . Influenza,inj,Quad PF,6+ Mos 02/21/2015  . Moderna Sars-Covid-2 Vaccination 05/30/2019, 06/27/2019, 04/13/2020  . Pneumococcal Conjugate-13 08/24/2014  . Pneumococcal Polysaccharide-23 07/18/2008, 08/15/2015  . Td 04/29/2004  . Tdap 08/24/2014  . Zoster 02/08/2010   4. Prostate cancer screening- no new urinary symptoms-typically do not screen past age 72 Lab Results  Component Value Date   PSA 0.38 08/08/2015   PSA 0.42 04/05/2014   PSA 0.28 09/02/2012   5. Colon cancer screening - last year plan was to retrial Cologuard- advised him to submit this. Try to submit cologuard or we can try to get you in for colonoscopy when things are more clear with the  foot 6. Skin cancer screening-[facility] with [facility]. advised regular sunscreen use. Denies worrisome, changing, or new skin lesions.  7.  Former smoker- quit 2004 with 1.5 pack years 8. STD screening -only active with wife  Status of chronic or acute concerns   #Chronic cough since November 2021 -  Normal chest x-ray 06/30/2020 -Improvement with treatment for sinusitis in the past with Augmentin  -I have considered adding Astelin-previously had taking Claritin but felt lightheaded in the day and stopped and later restarted, Mucinex, Flonase - will add astelin to his current claritin, flonase -Have considered adding reflux medication- he wants to hold off for now  -Originally held off on prednisone due to patient's treatment for osteomyelitis with PICC line   #Osteomyelitis of fifth toe left foot- on chronic daptomycin through infectious disease-started with chronic wound on the foot which has healed but concern that osteomyelitis persists with plan to consider chronic oral suppression -he is also considering visiting [facility]- wants another opinion for preservation of the foot   # Diabetes-managed by endocrinology Dr. Loanne Drilling -History of diabetic retinopathy - following closely with optho [facility] in [place] -History of diabetic polyneuropathy which contributed to diabetic wound/ulcer/osteomyelitis S: Medication:Lantus and NovoLog A/P: patient overdue for visit- encouraged him to call to schedule- he is considering getting another opinion with [facility]. We will check a1c as overdue  #CAD- no chest pain or shortness of breath  #Peripheral arterial disease- history of angioplasty in February 2022.  Statin myalgias #hyperlipidemia-with LDL goal under 70 particular with aortic atherosclerosis S: Medication:Aspirin 81 mg, Plavix 75 mg (interventional radiology has recommended continuing aspirin and Plavix), Zetia 10 mg, Livalo 1 mg  A/P: CAD asymptomatic. PAD improved after  angioplasty. LDL goal under 70- update lipids today   #hypertension-see separate problem-oriented note  #Iliac artery aneurysm- on repeat ultrasound 2021 noted to be ectatic only-consider 3 to 5-year repeat  Recommended follow up: Return in about 4 months (around 12/16/2020) for follow up- or sooner if needed. Future Appointments  Date Time Provider [place]  08/29/2020  8:45 AM Tommy Medal, Lavell Islam, MD [facility]   Lab/Order associations: fasting   ICD-10-CM   1. Preventative health care  Z00.00 Comprehensive metabolic panel    CBC with Differential/Platelet    Lipid panel    Hemoglobin A1c    POCT Urinalysis Dipstick (Automated)  2. Hypertension associated with diabetes ([place])  E11.59    I15.2   3. Hyperlipidemia associated with type 2 diabetes mellitus ([facility])  E11.69 Comprehensive metabolic panel   Z60.1 CBC with Differential/Platelet    Lipid panel  4. DM (diabetes mellitus) type II uncontrolled with eye manifestation ([facility])  E11.39 Comprehensive metabolic panel   U93.23 CBC with Differential/Platelet    Lipid panel    Hemoglobin A1c  5. Former smoker  Z87.891 POCT Urinalysis Dipstick (Automated)    Meds ordered this encounter  Medications  . azelastine (ASTELIN) 0.1 % nasal spray    Sig: Place 1 spray into both nostrils 2 (two) times daily.    Dispense:  30 mL    Refill:  12  . losartan (COZAAR) 25 MG tablet    Sig: Take 1 tablet (25 mg total) by mouth in the morning and at bedtime.    Dispense:  180 tablet    Refill:  3    Return precautions advised.  [name], MD

## 2020-08-15 ENCOUNTER — Encounter: Payer: Self-pay | Admitting: Infectious Disease

## 2020-08-15 DIAGNOSIS — M86272 Subacute osteomyelitis, left ankle and foot: Secondary | ICD-10-CM | POA: Diagnosis not present

## 2020-08-16 ENCOUNTER — Ambulatory Visit (INDEPENDENT_AMBULATORY_CARE_PROVIDER_SITE_OTHER): Payer: PPO | Admitting: Family Medicine

## 2020-08-16 ENCOUNTER — Encounter: Payer: Self-pay | Admitting: Family Medicine

## 2020-08-16 ENCOUNTER — Other Ambulatory Visit: Payer: Self-pay

## 2020-08-16 VITALS — BP 154/82 | HR 66 | Temp 97.5°F | Ht 72.0 in | Wt 263.4 lb

## 2020-08-16 DIAGNOSIS — Z0001 Encounter for general adult medical examination with abnormal findings: Secondary | ICD-10-CM | POA: Diagnosis not present

## 2020-08-16 DIAGNOSIS — Z87891 Personal history of nicotine dependence: Secondary | ICD-10-CM | POA: Diagnosis not present

## 2020-08-16 DIAGNOSIS — J301 Allergic rhinitis due to pollen: Secondary | ICD-10-CM

## 2020-08-16 DIAGNOSIS — E1139 Type 2 diabetes mellitus with other diabetic ophthalmic complication: Secondary | ICD-10-CM | POA: Diagnosis not present

## 2020-08-16 DIAGNOSIS — E785 Hyperlipidemia, unspecified: Secondary | ICD-10-CM

## 2020-08-16 DIAGNOSIS — I152 Hypertension secondary to endocrine disorders: Secondary | ICD-10-CM

## 2020-08-16 DIAGNOSIS — E1165 Type 2 diabetes mellitus with hyperglycemia: Secondary | ICD-10-CM

## 2020-08-16 DIAGNOSIS — IMO0002 Reserved for concepts with insufficient information to code with codable children: Secondary | ICD-10-CM

## 2020-08-16 DIAGNOSIS — Z0000 Encounter for general adult medical examination without abnormal findings: Secondary | ICD-10-CM | POA: Diagnosis not present

## 2020-08-16 DIAGNOSIS — E1169 Type 2 diabetes mellitus with other specified complication: Secondary | ICD-10-CM

## 2020-08-16 DIAGNOSIS — E1159 Type 2 diabetes mellitus with other circulatory complications: Secondary | ICD-10-CM

## 2020-08-16 LAB — POC URINALSYSI DIPSTICK (AUTOMATED)
Bilirubin, UA: NEGATIVE
Glucose, UA: NEGATIVE
Ketones, UA: NEGATIVE
Leukocytes, UA: NEGATIVE
Nitrite, UA: NEGATIVE
Protein, UA: POSITIVE — AB
Spec Grav, UA: >=1.03 — AB (ref 1.010–1.025)
Urobilinogen, UA: 0.2 E.U./dL
pH, UA: 5.5 (ref 5.0–8.0)

## 2020-08-16 LAB — COMPREHENSIVE METABOLIC PANEL
ALT: 9 U/L (ref 0–53)
AST: 14 U/L (ref 0–37)
Albumin: 4.1 g/dL (ref 3.5–5.2)
Alkaline Phosphatase: 52 U/L (ref 39–117)
BUN: 24 mg/dL — ABNORMAL HIGH (ref 6–23)
CO2: 26 mEq/L (ref 19–32)
Calcium: 9.4 mg/dL (ref 8.4–10.5)
Chloride: 102 mEq/L (ref 96–112)
Creatinine, Ser: 1.37 mg/dL (ref 0.40–1.50)
GFR: 50.28 mL/min — ABNORMAL LOW (ref >60.00)
Glucose, Bld: 178 mg/dL — ABNORMAL HIGH (ref 70–99)
Potassium: 5.2 mEq/L — ABNORMAL HIGH (ref 3.5–5.1)
Sodium: 138 mEq/L (ref 135–145)
Total Bilirubin: 1.2 mg/dL (ref 0.2–1.2)
Total Protein: 7.5 g/dL (ref 6.0–8.3)

## 2020-08-16 LAB — LIPID PANEL
Cholesterol: 155 mg/dL (ref 0–200)
HDL: 40.7 mg/dL (ref >39.00)
LDL Cholesterol: 94 mg/dL (ref 0–99)
NonHDL: 114.27
Total CHOL/HDL Ratio: 4
Triglycerides: 101 mg/dL (ref 0.0–149.0)
VLDL: 20.2 mg/dL (ref 0.0–40.0)

## 2020-08-16 LAB — CBC WITH DIFFERENTIAL/PLATELET
Basophils Absolute: 0 10*3/uL (ref 0.0–0.1)
Basophils Relative: 0.4 % (ref 0.0–3.0)
Eosinophils Absolute: 0.2 10*3/uL (ref 0.0–0.7)
Eosinophils Relative: 3.1 % (ref 0.0–5.0)
HCT: 35.5 % — ABNORMAL LOW (ref 39.0–52.0)
Hemoglobin: 11.7 g/dL — ABNORMAL LOW (ref 13.0–17.0)
Lymphocytes Relative: 19.9 % (ref 12.0–46.0)
Lymphs Abs: 1.5 10*3/uL (ref 0.7–4.0)
MCHC: 32.8 g/dL (ref 30.0–36.0)
MCV: 85.4 fl (ref 78.0–100.0)
Monocytes Absolute: 0.5 10*3/uL (ref 0.1–1.0)
Monocytes Relative: 6.1 % (ref 3.0–12.0)
Neutro Abs: 5.5 10*3/uL (ref 1.4–7.7)
Neutrophils Relative %: 70.5 % (ref 43.0–77.0)
Platelets: 285 10*3/uL (ref 150.0–400.0)
RBC: 4.16 Mil/uL — ABNORMAL LOW (ref 4.22–5.81)
RDW: 15.9 % — ABNORMAL HIGH (ref 11.5–15.5)
WBC: 7.8 10*3/uL (ref 4.0–10.5)

## 2020-08-16 LAB — HEMOGLOBIN A1C: Hgb A1c MFr Bld: 8.1 % — ABNORMAL HIGH (ref 4.6–6.5)

## 2020-08-16 MED ORDER — AZELASTINE HCL 0.1 % NA SOLN: 1.0000 | Freq: Two times a day (BID) | NASAL | 12 refills | Status: DC

## 2020-08-16 MED ORDER — LOSARTAN POTASSIUM 25 MG PO TABS: 25.0000 mg | ORAL_TABLET | Freq: Two times a day (BID) | ORAL | 3 refills | Status: DC

## 2020-08-16 NOTE — Assessment & Plan Note (Signed)
S: medication: Losartan 25 mg in AM, metoprolol 50 mg twice daily, torsemide 40 mg- has been taking sparingly - only mild edema Home readings #s: usually 150s in the mornings, may be 130s in afternoon. Diastolic controlled.  BP Readings from Last 3 Encounters:  08/16/20 (!) 154/82  08/02/20 (!) 183/84  07/03/20 (!) 173/80  A/P: blood pressure poorly controlled in the mornings in particular- we are going to increase losartan to 25 mg twice a day instead of 25 mg in the morning alone and asked him to update me with home readings in 3 weeks both and afternoon - keep an eye on blood pressure when you stop the daptomycin and keep me updated -also advised a dose of torsemide for increased swelling

## 2020-08-16 NOTE — Progress Notes (Signed)
Phone [phone] In person visit   Subjective:   Martin TURSI Sr. is a 75 y.o. year old very pleasant male patient who presents for/with See problem oriented charting  This visit occurred during the SARS-CoV-2 public health emergency.  Safety protocols were in place, including screening questions prior to the visit, additional usage of staff PPE, and extensive cleaning of exam room while observing appropriate contact time as indicated for disinfecting solutions.   Past Medical History-  Patient Active Problem List   Diagnosis Date Noted  . Postoperative atrial fibrillation ([place]) 10/24/2018    Priority: High  . 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 Undiagnosed cardiac murmurs 08/15/2015    Priority: High  . DM (diabetes mellitus) type II uncontrolled with eye manifestation ([place]) 02/03/2007    Priority: High  . 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 PAD (peripheral artery disease) ([place]) 08/20/2017    Priority: Medium  . Partial tear of right Achilles tendon 02/12/2017    Priority: Medium  . Aortic atherosclerosis ([place]) 10/07/2016    Priority: Medium  . History of skin cancer 08/15/2015    Priority: Medium  . Diabetic retinopathy ([place]) 02/21/2015    Priority: Medium  . Hyperlipidemia associated with type 2 diabetes mellitus ([place]) 08/24/2014    Priority: Medium  . Diabetic polyneuropathy ([place]) 04/11/2009    Priority: Medium  . Hypertension associated with diabetes ([place]) 02/03/2007    Priority: Medium  . Acute blood loss as cause of postoperative anemia 10/24/2018    Priority: Low  . BPH (benign prostatic hyperplasia) 10/24/2018    Priority: Low  . History of depression 10/24/2018    Priority: Low  . S/P CABG x 4 10/01/2018    Priority: Low  . Onychomycosis of toenail 02/17/2018    Priority: Low  . Allergic rhinitis 08/20/2017    Priority: Low  . C. difficile colitis 09/25/2016    Priority: Low  .  History of aspiration pneumonia 09/25/2016    Priority: Low  . Former smoker 08/24/2014    Priority: Low  . Ingrowing toenail 07/19/2014    Priority: Low  . BACK PAIN, LUMBAR, WITH RADICULOPATHY 08/30/2008    Priority: Low  . Osteomyelitis of fifth toe of left foot ([place]) 05/08/2020  . Simple chronic bronchitis ([place]) 05/08/2020    Medications- reviewed and updated Current Outpatient Medications  Medication Sig Dispense Refill  . aspirin 81 MG EC tablet Take 1 tablet (81 mg total) by mouth daily.    Marland Kitchen azelastine (ASTELIN) 0.1 % nasal spray Place 1 spray into both nostrils 2 (two) times daily. 30 mL 12  . clopidogrel (PLAVIX) 75 MG tablet Take 1 tablet (75 mg total) by mouth daily. 90 tablet 0  . DAPTOmycin 350 MG SOLR Inject into the vein.    Marland Kitchen ezetimibe (ZETIA) 10 MG tablet TAKE 1 TABLET BY MOUTH EVERY DAY (Patient taking differently: Take 10 mg by mouth daily.) 90 tablet 0  . fexofenadine (ALLEGRA) 180 MG tablet Take 180 mg by mouth daily as needed for allergies or rhinitis.    . fluticasone (FLONASE) 50 MCG/ACT nasal spray Place 2 sprays into both nostrils daily as needed for allergies or rhinitis.    Marland Kitchen insulin aspart (NOVOLOG FLEXPEN) 100 UNIT/ML FlexPen 3 times a day (just before each meal) 25-20-25 units, and pen needles 4/day (Patient taking differently: Inject 20-25 Units into the skin See admin instructions. 3 times a  day (just before each meal) 25-20-25 units, and pen needles 4/day) 25 pen 3  . insulin glargine (LANTUS SOLOSTAR) 100 UNIT/ML Solostar Pen Inject 10 Units into the skin at bedtime. 5 pen PRN  . Insulin Pen Needle (NOVOFINE) 32G X 6 MM MISC USE 5 TIMES DAILY 450 each 2  . LIVALO 1 MG TABS TAKE 1 TABLET BY MOUTH EVERY DAY (Patient taking differently: Take 1 mg by mouth daily.) 90 tablet 3  . metoprolol tartrate (LOPRESSOR) 50 MG tablet TAKE 1 TABLET BY MOUTH TWICE A DAY (Patient taking differently: Take 50 mg by mouth 2 (two) times daily.) 180 tablet 1  . ONETOUCH VERIO  test strip CHECK LEVELS 4 TIMES DAILY 150 strip 1  . vitamin C (ASCORBIC ACID) 250 MG tablet Take 250 mg by mouth daily.    Marland Kitchen linezolid (ZYVOX) 600 MG tablet Take 1 tablet (600 mg total) by mouth 2 (two) times daily. To be taken at first sign of cellulitis around foot (Patient taking differently: Take 600 mg by mouth 2 (two) times daily as needed. To be taken at first sign of cellulitis around foot) 28 tablet 3  . losartan (COZAAR) 25 MG tablet Take 1 tablet (25 mg total) by mouth in the morning and at bedtime. 180 tablet 3  . torsemide (DEMADEX) 20 MG tablet Take 20 mg by mouth daily. (Patient not taking: Reported on 08/16/2020)     No current facility-administered medications for this visit.     Objective:  BP (!) 154/82   Pulse 66   Temp (!) 97.5 F (36.4 C) (Temporal)   Ht 6' (1.829 m)   Wt 263 lb 6.4 oz (119.5 kg)   SpO2 97%   BMI 35.72 kg/m  Gen: NAD, resting comfortably     Assessment and Plan   Hypertension associated with diabetes ([place]) S: medication: Losartan 25 mg in AM, metoprolol 50 mg twice daily, torsemide 40 mg- has been taking sparingly - only mild edema Home readings #s: usually 150s in the mornings, may be 130s in afternoon. Diastolic controlled.  BP Readings from Last 3 Encounters:  08/16/20 (!) 154/82  08/02/20 (!) 183/84  07/03/20 (!) 173/80  A/P: blood pressure poorly controlled in the mornings in particular- we are going to increase losartan to 25 mg twice a day instead of 25 mg in the morning alone and asked him to update me with home readings in 3 weeks both and afternoon - keep an eye on blood pressure when you stop the daptomycin and keep me updated-patient feels like blood pressure increased when he started this medicine -also advised a dose of torsemide for increased swelling  Allergic rhinitis S: Patient with ongoing symptoms including postnasal drip, nasal congestion, runny nose, sneezing as well as cough.  He had not previously been tolerating  Claritin but is tolerating that again.  Also taking Mucinex and Flonase but still having issues A/P: With ongoing significant issues- allergies seem to be a big trigger for chronic cough- offered referral to allergist versus trial of Astelin-he would like to trial Astelin first and if not improving can refer to allergist    Recommended follow up: Update me in 3 weeks via MyChart about blood pressure Future Appointments  Date Time Provider [place]  08/29/2020  8:45 AM Tommy Medal, Lavell Islam, MD [facility]  01/25/2021  8:40 AM Yong Channel [name], MD [facility]    Lab/Order associations:   ICD-10-CM   1. Hypertension associated with diabetes ([name])  E11.59  I15.2   2. Seasonal allergic rhinitis due to pollen  J30.1     Meds ordered this encounter  Medications  . azelastine (ASTELIN) 0.1 % nasal spray    Sig: Place 1 spray into both nostrils 2 (two) times daily.    Dispense:  30 mL    Refill:  12  . losartan (COZAAR) 25 MG tablet    Sig: Take 1 tablet (25 mg total) by mouth in the morning and at bedtime.    Dispense:  180 tablet    Refill:  3   Return precautions advised.  [name], MD

## 2020-08-16 NOTE — Addendum Note (Signed)
Addended by: Brandy Hale on: 08/16/2020 10:40 AM   Modules accepted: Orders

## 2020-08-16 NOTE — Assessment & Plan Note (Signed)
S: Patient with ongoing symptoms including postnasal drip, nasal congestion, runny nose, sneezing as well as cough.  He had not previously been tolerating Claritin but is tolerating that again.  Also taking Mucinex and Flonase but still having issues A/P: With ongoing significant issues- allergies seem to be a big trigger for chronic cough- offered referral to allergist versus trial of Astelin-he would like to trial Astelin first and if not improving can refer to allergist

## 2020-08-17 DIAGNOSIS — M86272 Subacute osteomyelitis, left ankle and foot: Secondary | ICD-10-CM | POA: Diagnosis not present

## 2020-08-19 DIAGNOSIS — M86272 Subacute osteomyelitis, left ankle and foot: Secondary | ICD-10-CM | POA: Diagnosis not present

## 2020-08-21 DIAGNOSIS — M86272 Subacute osteomyelitis, left ankle and foot: Secondary | ICD-10-CM | POA: Diagnosis not present

## 2020-08-23 ENCOUNTER — Other Ambulatory Visit: Payer: Self-pay | Admitting: Interventional Radiology

## 2020-08-23 DIAGNOSIS — L97401 Non-pressure chronic ulcer of unspecified heel and midfoot limited to breakdown of skin: Secondary | ICD-10-CM

## 2020-08-23 DIAGNOSIS — E08621 Diabetes mellitus due to underlying condition with foot ulcer: Secondary | ICD-10-CM

## 2020-08-23 LAB — HM DIABETES EYE EXAM

## 2020-08-24 ENCOUNTER — Telehealth: Payer: Self-pay

## 2020-08-24 DIAGNOSIS — D3131 Benign neoplasm of right choroid: Secondary | ICD-10-CM | POA: Diagnosis not present

## 2020-08-24 DIAGNOSIS — E113213 Type 2 diabetes mellitus with mild nonproliferative diabetic retinopathy with macular edema, bilateral: Secondary | ICD-10-CM | POA: Diagnosis not present

## 2020-08-24 DIAGNOSIS — H40023 Open angle with borderline findings, high risk, bilateral: Secondary | ICD-10-CM | POA: Diagnosis not present

## 2020-08-24 DIAGNOSIS — H35372 Puckering of macula, left eye: Secondary | ICD-10-CM | POA: Diagnosis not present

## 2020-08-24 NOTE — Telephone Encounter (Signed)
Looks like it may be our only option since it is tetracycline R and linezolid isn't a great long term option. I think it would be fine. His kidneys look to have recovered. We would just need to watch closely. His CrCl is about 77 ml/min right now, so still good. Based on his weight..I would still do 2 DS tabs BID.

## 2020-08-24 NOTE — Telephone Encounter (Signed)
Received call today from [place] with [facility] for pull picc orders. States patient is ending IV antibiotics on 5/3.Is scheduled to see MD on 5/2. Will update nursing after appointment.  P: [place], RMA

## 2020-08-25 DIAGNOSIS — M86272 Subacute osteomyelitis, left ankle and foot: Secondary | ICD-10-CM | POA: Diagnosis not present

## 2020-08-25 NOTE — Telephone Encounter (Signed)
Sounds good

## 2020-08-28 DIAGNOSIS — M86272 Subacute osteomyelitis, left ankle and foot: Secondary | ICD-10-CM | POA: Diagnosis not present

## 2020-08-29 ENCOUNTER — Telehealth: Payer: Self-pay | Admitting: *Deleted

## 2020-08-29 ENCOUNTER — Other Ambulatory Visit (HOSPITAL_COMMUNITY): Payer: Self-pay

## 2020-08-29 ENCOUNTER — Ambulatory Visit: Payer: PPO | Admitting: Infectious Disease

## 2020-08-29 ENCOUNTER — Other Ambulatory Visit: Payer: Self-pay

## 2020-08-29 ENCOUNTER — Encounter: Payer: Self-pay | Admitting: Infectious Disease

## 2020-08-29 VITALS — BP 174/90 | HR 66 | Temp 98.0°F | Ht 72.0 in | Wt 265.0 lb

## 2020-08-29 DIAGNOSIS — I2510 Atherosclerotic heart disease of native coronary artery without angina pectoris: Secondary | ICD-10-CM | POA: Diagnosis not present

## 2020-08-29 DIAGNOSIS — N1832 Chronic kidney disease, stage 3b: Secondary | ICD-10-CM | POA: Diagnosis not present

## 2020-08-29 DIAGNOSIS — E1159 Type 2 diabetes mellitus with other circulatory complications: Secondary | ICD-10-CM | POA: Diagnosis not present

## 2020-08-29 DIAGNOSIS — M869 Osteomyelitis, unspecified: Secondary | ICD-10-CM | POA: Diagnosis not present

## 2020-08-29 DIAGNOSIS — E1165 Type 2 diabetes mellitus with hyperglycemia: Secondary | ICD-10-CM

## 2020-08-29 DIAGNOSIS — E875 Hyperkalemia: Secondary | ICD-10-CM | POA: Diagnosis not present

## 2020-08-29 DIAGNOSIS — I739 Peripheral vascular disease, unspecified: Secondary | ICD-10-CM | POA: Diagnosis not present

## 2020-08-29 DIAGNOSIS — A0472 Enterocolitis due to Clostridium difficile, not specified as recurrent: Secondary | ICD-10-CM

## 2020-08-29 DIAGNOSIS — I152 Hypertension secondary to endocrine disorders: Secondary | ICD-10-CM

## 2020-08-29 DIAGNOSIS — E1139 Type 2 diabetes mellitus with other diabetic ophthalmic complication: Secondary | ICD-10-CM | POA: Diagnosis not present

## 2020-08-29 DIAGNOSIS — N183 Chronic kidney disease, stage 3 unspecified: Secondary | ICD-10-CM

## 2020-08-29 DIAGNOSIS — IMO0002 Reserved for concepts with insufficient information to code with codable children: Secondary | ICD-10-CM

## 2020-08-29 HISTORY — DX: Chronic kidney disease, stage 3 unspecified: N18.30

## 2020-08-29 HISTORY — DX: Hyperkalemia: E87.5

## 2020-08-29 NOTE — Telephone Encounter (Signed)
Thanks

## 2020-08-29 NOTE — Telephone Encounter (Signed)
Per verbal order from Dr Tommy Medal, RN pulled PICC in clinic. Notified Tim at [facility] of PICC pull. Landis Gandy, RN

## 2020-08-29 NOTE — Progress Notes (Signed)
Subjective:  Chief complaint follow-up for diabetic foot infection   Patient ID: Martin Agee Sr., male    DOB: Oct 23, 1945, 75 y.o.   MRN: [id number]  HPI  Martin [name] is a 75 year old Caucasian man with a past medical history significant for coronary artery disease status post coronary bypass grafting, diabetes mellitus peripheral artery disease who has been struggling with an ulcer in his foot for several years now.   Unfortunately his developed osteomyelitis. He was referred to Dr. Sharol Given who offered him surgery but the patient was not ready to do this yet.  There is also concern about his ability to heal any surgery in his foot due to his peripheral vascular disease.  He may in fact require a proximal amputation such as a below the knee amputation for cure.  At present he is in a very difficult situation socially and that he is caring for his wife and is the sole caregiver. His wife has metastatic breast cancer that metastasized to bone and she is currently bedbound.  He is not ready to have any type of surgery including surgery on the foot at this point in time but wants to see his wife improving first over more resolution on not from prior to allowing himself to be hospitalized for a surgical intervention.  He has been followed closely by the wound care clinic and Dr. Dellia Nims who gave him a prescription for Zyvox when the patient developed erythema that went up his leg and swelling.  He responded well to Zyvox he also had had DVT excluded through duplex.  He has had MRSA isolated from cultures from the wound center which has been resistant to tetracycline clindamycin fluoroquinolones but sensitive to Bactrim and vancomycin  The erythema in his leghaddown considerably since he is received his course of Zyvox  I observed him off antibiotics but there was a great desire his part on the prior Dr. Dellia Nims to see what systemic antibiotics would do.  Therefore we started IV  daptomycin with a goal of getting through 8 weeks of therapy He states that after he had revascularization and initiated daptomycin that he had dramatic improvement in his wound which is now closed.  He said it improved within a matter of 2 weeks.  Did injure his toenail prior to last visit.  His wound indeed had healed up quite nicely and continues to look dramatically improved.  I do continue to have anxiety about the underlying osteomyelitis.  Unfortunately there are not really many good oral options.  I do not want to hazard clindamycin and potentially cause C. difficile colitis.  He has high potassium at baseline and chronic kidney disease making Bactrim dangerous and undesirable antibiotic as well.  We can see if he might build to get pharmaceutical assistance for tedezolid or possibly omadacycline being a Medicare patient may make this more complicated.  We will investigate this another option would be rounds of long-acting oritavancin or dalbavancin. I do not think though that these really viable long-term options. .  Past Medical History:  Diagnosis Date  . A-fib ([place])   . Diabetes mellitus   . Heart attack ([place]) 10/01/2018   Pt had open heart surgery  . Hypertension   . Osteomyelitis of fifth toe of left foot ([place]) 05/08/2020  . Simple chronic bronchitis ([place]) 05/08/2020  . Tobacco abuse     Past Surgical History:  Procedure Laterality Date  . CORONARY ARTERY BYPASS GRAFT N/A 10/01/2018   Procedure: CORONARY ARTERY BYPASS  GRAFTING (CABG) TIMES  FOUR USING LEFT MAMMARY ARTERY AND RIGHT GREATER SAPHEANOUS VEIN HARVESTED ENDOSCOPICALLY;  Surgeon: Melrose Nakayama, MD;  Location: [facility];  Service: Open Heart Surgery;  Laterality: N/A;  . HERNIA REPAIR     >10 years ago  . IR ANGIOGRAM EXTREMITY LEFT  06/16/2020  . IR RADIOLOGIST EVAL & MGMT  12/24/2016  . IR RADIOLOGIST EVAL & MGMT  05/30/2020  . IR RADIOLOGIST EVAL & MGMT  07/13/2020  . IR THORACENTESIS ASP PLEURAL SPACE W/IMG  GUIDE  10/06/2018  . IR US GUIDE VASC ACCESS RIGHT  06/16/2020  . LEFT HEART CATH AND CORONARY ANGIOGRAPHY N/A 09/29/2018   Procedure: LEFT HEART CATH AND CORONARY ANGIOGRAPHY;  Surgeon: Troy Sine, MD;  Location: [facility];  Service: Cardiovascular;  Laterality: N/A;  . open heart surfery    . right shoulder surgery     around 2014  . TEE WITHOUT CARDIOVERSION N/A 10/01/2018   Procedure: TRANSESOPHAGEAL ECHOCARDIOGRAM (TEE);  Surgeon: Melrose Nakayama, MD;  Location: [employer];  Service: Open Heart Surgery;  Laterality: N/A;    Family History  Problem Relation Age of Onset  . Hypertension Mother   . Heart disease Mother        CHF did not see doctor  . Diabetes Maternal Grandmother   . Diabetes Son       Social History   Socioeconomic History  . Marital status: Married    Spouse name: Not on file  . Number of children: Not on file  . Years of education: Not on file  . Highest education level: Not on file  Occupational History  . Not on file  Tobacco Use  . Smoking status: Former Smoker    Packs/day: 0.75    Years: 2.00    Pack years: 1.50    Types: Cigarettes    Start date: 2002    Quit date: 04/29/2002    Years since quitting: 18.3  . Smokeless tobacco: Never Used  . Tobacco comment: smoked for 10 years on and off  Substance and Sexual Activity  . Alcohol use: Yes    Alcohol/week: 0.0 standard drinks    Comment: 6 martinis a year  . Drug use: No  . Sexual activity: Yes  Other Topics Concern  . Not on file  Social History Narrative   Married. 3 kids. 7 grandkids. No greatgrandkids.       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 Simvastatin Diarrhea  . Statins Other (See Comments)    achey joints  . Xanax [Alprazolam] Anxiety     Current Outpatient Medications:  .  aspirin 81 MG EC tablet, Take 1 tablet (81 mg total) by mouth daily., Disp: , Rfl:  .  azelastine (ASTELIN) 0.1 % nasal spray, Place 1 spray into both nostrils 2 (two) times daily., Disp: 30 mL, Rfl: 12 .  clopidogrel (PLAVIX) 75 MG tablet, Take 1 tablet (75 mg total) by mouth daily., Disp: 90 tablet, Rfl: 0 .  DAPTOmycin (CUBICIN) 500 MG injection, Inject into the vein., Disp: , Rfl:  .  DAPTOmycin 350 MG SOLR,  Inject into the vein., Disp: , Rfl:  .  ezetimibe (ZETIA) 10 MG tablet, TAKE 1 TABLET BY MOUTH EVERY DAY (Patient taking differently: Take 10 mg by mouth daily.), Disp: 90 tablet, Rfl: 0 .  fexofenadine (ALLEGRA) 180 MG tablet, Take 180 mg by mouth daily as needed for allergies or rhinitis., Disp: , Rfl:  .  fluticasone (FLONASE) 50 MCG/ACT nasal spray, Place 2 sprays into both nostrils daily as needed for allergies or rhinitis., Disp: , Rfl:  .  insulin aspart (NOVOLOG FLEXPEN) 100 UNIT/ML FlexPen, 3 times a day (just before each meal) 25-20-25 units, and pen needles 4/day (Patient taking differently: Inject 20-25 Units into the skin See admin instructions. 3 times a day (just before each meal) 25-20-25 units, and pen needles 4/day), Disp: 25 pen, Rfl: 3 .  insulin glargine (LANTUS SOLOSTAR) 100 UNIT/ML Solostar Pen, Inject 10 Units into the skin at bedtime., Disp: 5 pen, Rfl: PRN .  Insulin Pen Needle (NOVOFINE) 32G X 6 MM MISC, USE 5 TIMES DAILY, Disp: 450 each, Rfl: 2 .  linezolid (ZYVOX) 600 MG tablet, Take 1 tablet (600 mg total) by mouth 2 (two) times daily. To be taken at first sign of cellulitis around foot (Patient taking differently: Take 600 mg by mouth 2 (two) times daily as needed. To be taken at first sign of cellulitis around foot), Disp: 28 tablet, Rfl: 3 .  LIVALO 1 MG TABS, TAKE 1 TABLET BY MOUTH EVERY DAY (Patient  taking differently: Take 1 mg by mouth daily.), Disp: 90 tablet, Rfl: 3 .  losartan (COZAAR) 25 MG tablet, Take 1 tablet (25 mg total) by mouth in the morning and at bedtime., Disp: 180 tablet, Rfl: 3 .  metoprolol tartrate (LOPRESSOR) 50 MG tablet, TAKE 1 TABLET BY MOUTH TWICE A DAY (Patient taking differently: Take 50 mg by mouth 2 (two) times daily.), Disp: 180 tablet, Rfl: 1 .  ONETOUCH VERIO test strip, CHECK LEVELS 4 TIMES DAILY, Disp: 150 strip, Rfl: 1 .  torsemide (DEMADEX) 20 MG tablet, Take 20 mg by mouth daily., Disp: , Rfl:  .  vitamin C (ASCORBIC ACID) 250 MG tablet, Take 250 mg by mouth daily., Disp: , Rfl:    Review of Systems  Constitutional: Negative for activity change, appetite change, chills, diaphoresis, fatigue, fever and unexpected weight change.  HENT: Negative for congestion, rhinorrhea, sinus pressure, sneezing, sore throat and trouble swallowing.   Eyes: Negative for photophobia and visual disturbance.  Respiratory: Negative for cough, chest tightness, shortness of breath, wheezing and stridor.   Cardiovascular: Negative for chest pain, palpitations and leg swelling.  Gastrointestinal: Negative for abdominal distention, abdominal pain, anal bleeding, blood in stool, constipation, diarrhea, nausea and vomiting.  Genitourinary: Negative for difficulty urinating, dysuria, flank pain and hematuria.  Musculoskeletal: Negative for arthralgias, back pain, gait problem, joint swelling and myalgias.  Skin: Positive for wound. Negative for color change, pallor and rash.  Neurological: Negative for dizziness, tremors, weakness and light-headedness.  Hematological: Negative for adenopathy. Does not bruise/bleed easily.  Psychiatric/Behavioral: Negative for agitation, behavioral problems, confusion, decreased concentration, dysphoric mood and sleep disturbance.       Objective:   Physical Exam Constitutional:      Appearance: He is well-developed.  HENT:     Head:  Normocephalic and atraumatic.  Eyes:     Extraocular Movements: Extraocular movements intact.     Conjunctiva/sclera: Conjunctivae normal.  Cardiovascular:     Rate and Rhythm: Normal rate and regular rhythm.  Pulmonary:  Effort: Pulmonary effort is normal. No respiratory distress.     Breath sounds: No wheezing.  Abdominal:     General: There is no distension.     Palpations: Abdomen is soft.  Musculoskeletal:        General: No tenderness. Normal range of motion.     Cervical back: Normal range of motion and neck supple.  Skin:    General: Skin is warm and dry.     Coloration: Skin is not jaundiced or pale.     Findings: No bruising, erythema, lesion or rash.  Neurological:     General: No focal deficit present.     Mental Status: He is alert and oriented to person, place, and time.  Psychiatric:        Mood and Affect: Mood normal.        Behavior: Behavior normal.        Thought Content: Thought content normal.        Judgment: Judgment normal.    Foot   06/05/2020:        06/16/2020:       08/02/2020:     Foot today Aug 29, 2020:     Toenail 08/28/2020:    PICC:         Assessment & Plan:  Osteomyelitis of the foot with peripheral vascular disease status post revascularization and daptomycin continues on.   While his wound is healed up I am skeptical that we are going to build to cure his actual osteomyelitis   I will ask our ID pharmacy team to look into the possibility of him getting tedezolid or omadacycline (if latter might be active)  I think if we do not have viable oral options for now then I would instead prefer to take an approach of watchful waiting.  He does have a bottle of Zyvox at home that he can take as soon as any problems emerge.  Certainly if his infection reemerges and osteomyelitis declares itself again he could get curative amputation with Dr. Sharol Given if the patient is able to be able to take time off to go through the  surgery.  Currently with his wife's medical condition he really cannot afford to be away from her for even that amount of time.  However that may be different in the future.  We also may potentially be able to give him IV antibiotics again to get this under control if it reemerges at a time that he cannot undergo surgery.  PVD sp vascularization and seeing interventional radiology next week.  Hypertension blood pressures been higher while getting volumes of antibiotics.  Diabetes mellitus followed by PCP  Loose toenail with onychomycosis: Likely seeded need to see a podiatrist to have this removed if it does not come off on its own.  Chronic kidney disease and hyperkalemia: These really preclude the use of Bactrim   History of C. difficile colitis: This precludes the use of clindamycin  I spent greater than 40 minutes with the patient including greater than 50% of time in face to face counsel of the patient regarding his osteomyelitis his MRSA infection antibiotic options that are limited, his peripheral vascular disease diabetes his onychomycosis and loose toenail and in coordination of his car.

## 2020-08-29 NOTE — Progress Notes (Signed)
Per verbal order from Dr Tommy Medal, 46 cm Single Lumen Peripherally Inserted Central Catheter removed from right basilic, tip intact. No sutures present. RN confirmed length per chart. Dressing was clean and dry. Petroleum dressing applied. Pt advised no heavy lifting with this arm, leave dressing for 24 hours and call the office or seek emergent care if dressing becomes soaked with blood, swelling, or sharp pain presents. Patient verbalized understanding and agreement.  Patient's questions answered to their satisfaction.  Patient mildly diaphoretic, but states he feels well. His blood sugar was in the normal range this morning, and although he did not eat breakfast this morning, he feels his sugar is fine right now. He does not get into trouble until his sugars are in the 70's. RN offered patient snack or drink, but patient declined stating that he is going home and will eat there. Patient tolerated procedure well, RN walked patient to check out. Pharmacy notified. Landis Gandy, RN

## 2020-08-31 ENCOUNTER — Encounter: Payer: Self-pay | Admitting: Family Medicine

## 2020-09-06 ENCOUNTER — Ambulatory Visit
Admission: RE | Admit: 2020-09-06 | Discharge: 2020-09-06 | Disposition: A | Discharge status: Home / Self Care | Payer: PPO | Source: Ambulatory Visit | Attending: Interventional Radiology | Admitting: Interventional Radiology

## 2020-09-06 ENCOUNTER — Other Ambulatory Visit: Payer: Self-pay | Admitting: Endocrinology

## 2020-09-06 ENCOUNTER — Encounter: Payer: Self-pay | Admitting: *Deleted

## 2020-09-06 DIAGNOSIS — E11621 Type 2 diabetes mellitus with foot ulcer: Secondary | ICD-10-CM | POA: Diagnosis not present

## 2020-09-06 DIAGNOSIS — E1139 Type 2 diabetes mellitus with other diabetic ophthalmic complication: Secondary | ICD-10-CM

## 2020-09-06 DIAGNOSIS — L97401 Non-pressure chronic ulcer of unspecified heel and midfoot limited to breakdown of skin: Secondary | ICD-10-CM

## 2020-09-06 DIAGNOSIS — I739 Peripheral vascular disease, unspecified: Secondary | ICD-10-CM | POA: Diagnosis not present

## 2020-09-06 DIAGNOSIS — IMO0002 Reserved for concepts with insufficient information to code with codable children: Secondary | ICD-10-CM

## 2020-09-06 DIAGNOSIS — E08621 Diabetes mellitus due to underlying condition with foot ulcer: Secondary | ICD-10-CM

## 2020-09-06 DIAGNOSIS — L97529 Non-pressure chronic ulcer of other part of left foot with unspecified severity: Secondary | ICD-10-CM | POA: Diagnosis not present

## 2020-09-06 HISTORY — PX: IR RADIOLOGIST EVAL & MGMT: IMG5224

## 2020-09-06 NOTE — Progress Notes (Signed)
Referring Physician(s): Pa Tennant J  Chief Complaint: The patient is seen in follow up today s/p left lower extremity angiogram, laser atherectomy of the popliteal and proximal anterior tibial arteries with balloon and drug coated angioplasty on 06/16/20.  History of present illness: [name] continues to recover well after his procedure.  His left foot wound is now totally closed. Prior PICC for long term antibiotics for osteomyelitis has been removed.  He endorses no pain in the foot or left lower extremity, muscle cramps, fevers, chills, shortness of breath.  He continues to practice meticulous foot care, but unfortunately suffered an accidental toe nail injury of the left great toe recently.  He is continuing to take aspirin and plavix which he's tolerating well.  He endorses anxiety about the possibility of persistent smoldering osteomyelitis and recurrence of infection.  He is contemplating traveling to [facility] clinic or [facility] for management.  He continues to take care of his wife at home (breast ca) and is adamant about avoiding amputation.     Past Medical History:  Diagnosis Date  . A-fib ([place])   . CKD (chronic kidney disease) stage 3, GFR 30-59 ml/min ([place]) 08/29/2020  . Diabetes mellitus   . Heart attack ([place]) 10/01/2018   Pt had open heart surgery  . Hyperkalemia 08/29/2020  . Hypertension   . Osteomyelitis of fifth toe of left foot ([place]) 05/08/2020  . Simple chronic bronchitis ([place]) 05/08/2020  . Tobacco abuse     Past Surgical History:  Procedure Laterality Date  . CORONARY ARTERY BYPASS GRAFT N/A 10/01/2018   Procedure: CORONARY ARTERY BYPASS GRAFTING (CABG) TIMES  FOUR USING LEFT MAMMARY ARTERY AND RIGHT GREATER SAPHEANOUS VEIN HARVESTED ENDOSCOPICALLY;  Surgeon: Melrose Nakayama, MD;  Location: [facility];  Service: Open Heart Surgery;  Laterality: N/A;  . HERNIA REPAIR     >10 years ago  . IR ANGIOGRAM EXTREMITY LEFT  06/16/2020  . IR RADIOLOGIST EVAL & MGMT  12/24/2016  .  IR RADIOLOGIST EVAL & MGMT  05/30/2020  . IR RADIOLOGIST EVAL & MGMT  07/13/2020  . IR THORACENTESIS ASP PLEURAL SPACE W/IMG GUIDE  10/06/2018  . IR US GUIDE VASC ACCESS RIGHT  06/16/2020  . LEFT HEART CATH AND CORONARY ANGIOGRAPHY N/A 09/29/2018   Procedure: LEFT HEART CATH AND CORONARY ANGIOGRAPHY;  Surgeon: Troy Sine, MD;  Location: [facility];  Service: Cardiovascular;  Laterality: N/A;  . open heart surfery    . right shoulder surgery     around 2014  . TEE WITHOUT CARDIOVERSION N/A 10/01/2018   Procedure: TRANSESOPHAGEAL ECHOCARDIOGRAM (TEE);  Surgeon: Melrose Nakayama, MD;  Location: [facility];  Service: Open Heart Surgery;  Laterality: N/A;    Allergies: Simvastatin, Statins, and Xanax [alprazolam]  Medications: Prior to Admission medications   Medication Sig Start Date End Date Taking? Authorizing Provider  aspirin 81 MG EC tablet Take 1 tablet (81 mg total) by mouth daily. 02/04/19   Troy Sine, MD  azelastine (ASTELIN) 0.1 % nasal spray Place 1 spray into both nostrils 2 (two) times daily. 08/16/20   Marin Olp, MD  clopidogrel (PLAVIX) 75 MG tablet Take 1 tablet (75 mg total) by mouth daily. 06/05/20   [place] Shawgo, Rosanne Ashing, MD  DAPTOmycin (CUBICIN) 500 MG injection Inject into the vein. 08/23/20   [provider]  DAPTOmycin 350 MG SOLR Inject into the vein.    [provider]  ezetimibe (ZETIA) 10 MG tablet TAKE 1 TABLET BY MOUTH EVERY DAY  Patient taking differently: Take 10 mg by mouth daily. 05/04/20   Troy Sine, MD  fexofenadine (ALLEGRA) 180 MG tablet Take 180 mg by mouth daily as needed for allergies or rhinitis.    [provider]  fluticasone (FLONASE) 50 MCG/ACT nasal spray Place 2 sprays into both nostrils daily as needed for allergies or rhinitis.    [provider]  insulin aspart (NOVOLOG FLEXPEN) 100 UNIT/ML FlexPen 3 times a day (just before each meal) 25-20-25 units, and pen needles 4/day Patient taking  differently: Inject 20-25 Units into the skin See admin instructions. 3 times a day (just before each meal) 25-20-25 units, and pen needles 4/day 07/13/19   Renato Shin, MD  insulin glargine (LANTUS SOLOSTAR) 100 UNIT/ML Solostar Pen Inject 10 Units into the skin at bedtime. 07/13/19   Renato Shin, MD  Insulin Pen Needle (NOVOFINE) 32G X 6 MM MISC USE 5 TIMES DAILY 07/05/19   Renato Shin, MD  linezolid (ZYVOX) 600 MG tablet Take 1 tablet (600 mg total) by mouth 2 (two) times daily. To be taken at first sign of cellulitis around foot Patient taking differently: Take 600 mg by mouth 2 (two) times daily as needed. To be taken at first sign of cellulitis around foot 06/05/20   Tommy Medal, Lavell Islam, MD  LIVALO 1 MG TABS TAKE 1 TABLET BY MOUTH EVERY DAY Patient taking differently: Take 1 mg by mouth daily. 05/15/20   Marin Olp, MD  losartan (COZAAR) 25 MG tablet Take 1 tablet (25 mg total) by mouth in the morning and at bedtime. 08/16/20   Marin Olp, MD  metoprolol tartrate (LOPRESSOR) 50 MG tablet TAKE 1 TABLET BY MOUTH TWICE A DAY Patient taking differently: Take 50 mg by mouth 2 (two) times daily. 03/21/20   Troy Sine, MD  ONETOUCH VERIO test strip CHECK LEVELS 4 TIMES DAILY 06/20/20   Renato Shin, MD  torsemide (DEMADEX) 20 MG tablet Take 20 mg by mouth daily.    [provider]  vitamin C (ASCORBIC ACID) 250 MG tablet Take 250 mg by mouth daily.    [provider]     Family History  Problem Relation Age of Onset  . Hypertension Mother   . Heart disease Mother        CHF did not see doctor  . Diabetes Maternal Grandmother   . Diabetes Son     Social History   Socioeconomic History  . Marital status: Married    Spouse name: Not on file  . Number of children: Not on file  . Years of education: Not on file  . Highest education level: Not on file  Occupational History  . Not on file  Tobacco Use  . Smoking status: Former Smoker    Packs/day: 0.75     Years: 2.00    Pack years: 1.50    Types: Cigarettes    Start date: 2002    Quit date: 04/29/2002    Years since quitting: 18.3  . Smokeless tobacco: Never Used  . Tobacco comment: smoked for 10 years on and off  Substance and Sexual Activity  . Alcohol use: Yes    Alcohol/week: 0.0 standard drinks    Comment: 6 martinis a year  . Drug use: No  . Sexual activity: Yes  Other Topics Concern  . Not on file  Social History Narrative   Married. 3 kids. 7 grandkids. No greatgrandkids.       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  Physical Exam Constitutional:      General: He is not in acute distress. HENT:     Head: Normocephalic.     Mouth/Throat:     Mouth: Mucous membranes are dry.  Cardiovascular:     Rate and Rhythm: Normal rate and regular rhythm.  Pulmonary:     Breath sounds: Normal breath sounds.  Abdominal:     General: There is no distension.  Musculoskeletal:        General: No swelling.     Right lower leg: No edema.     Left lower leg: No edema.  Skin:    General: Skin is warm and dry.  Neurological:     Mental Status: He is alert and oriented to person, place, and time.      Imaging: No results found.  Labs:  CBC: Recent Labs    02/09/20 1030 05/08/20 1020 06/16/20 0730 08/16/20 1030  WBC 7.2 7.0 9.1 7.8  HGB 12.5* 10.8* 10.9* 11.7*  HCT 38.0* 33.2* 35.7* 35.5*  PLT 239 197 212 285.0    COAGS: Recent Labs    06/16/20 0730  INR 1.1    BMP: Recent Labs    02/09/20 1030 05/08/20 1020 06/16/20 0730 08/16/20 1030  NA 138 138 136 138  K 4.6 5.0 3.9 5.2 No hemolysis  seen*  CL 102 104 101 102  CO2 27 27 25 26   GLUCOSE 161* 196* 208* 178*  BUN 24 27* 30* 24*  CALCIUM 9.3 9.2 8.9 9.4  CREATININE 1.39* 1.36* 1.44* 1.37  GFRNONAA 49* 51* 51*  --   GFRAA 57* 59*  --   --     LIVER FUNCTION TESTS: Recent Labs    02/09/20 1030 08/16/20 1030  BILITOT 1.9* 1.2  AST 23 14  ALT 22 9  ALKPHOS  --  52  PROT 7.4 7.5  ALBUMIN  --  4.1    Assessment and Plan: 75 year old male with history of [place] 5 peripheral artery disease status post left popliteal and anterior tibial artery revascularization (laser atherectomy, balloon angioplasty, drug coated balloon angioplasty on 06/16/20) now with closed wound.  Given prompt resolution of soft tissue infection and wound in addition to long, now completed course of IV antibiotics, I suspect his osteomyelitis has also improved.    -If there is clinical suspicion for persistent or recurrent osteomyelitis, recommend repeat MRI.  I would also recommend repeat angiogram at this time with attempted recanalization of the distal anterior tibial artery which is occluded with supply the dorsum of the foot via multiple calf collaterals, and inline flow to the foot via the posterior tibial artery. -Continue Plavix for 3 more months. -Follow up in 3 months with repeat ABIs, or sooner if issues arise.   Electronically Signed: Suzette Battiest 09/06/2020, 8:01 AM   I spent a total of 40 Minutes in face to face in clinical consultation, greater than 50% of which was counseling/coordinating care for peripheral artery disease.

## 2020-09-21 ENCOUNTER — Telehealth: Payer: Self-pay | Admitting: Endocrinology

## 2020-09-21 DIAGNOSIS — IMO0002 Reserved for concepts with insufficient information to code with codable children: Secondary | ICD-10-CM

## 2020-09-21 DIAGNOSIS — E1139 Type 2 diabetes mellitus with other diabetic ophthalmic complication: Secondary | ICD-10-CM

## 2020-09-21 NOTE — Telephone Encounter (Signed)
Pt has an app on 12/18/2020 but in the meantime pt is needing  a medication refill. insulin aspart (NOVOLOG FLEXPEN) 100 UNIT/ML FlexPen  insulin glargine (LANTUS SOLOSTAR) 100 UNIT/ML Solostar Pen  [id number] - [place], [place] - [address] AT [place]

## 2020-09-22 ENCOUNTER — Other Ambulatory Visit: Payer: Self-pay

## 2020-09-22 DIAGNOSIS — E1139 Type 2 diabetes mellitus with other diabetic ophthalmic complication: Secondary | ICD-10-CM

## 2020-09-22 DIAGNOSIS — IMO0002 Reserved for concepts with insufficient information to code with codable children: Secondary | ICD-10-CM

## 2020-09-22 MED ORDER — LANTUS SOLOSTAR 100 UNIT/ML ~~LOC~~ SOPN: 10.0000 [IU] | PEN_INJECTOR | Freq: Every day | SUBCUTANEOUS | Status: DC

## 2020-09-22 MED ORDER — NOVOLOG FLEXPEN 100 UNIT/ML ~~LOC~~ SOPN: 20.0000 [IU] | PEN_INJECTOR | SUBCUTANEOUS | 1 refills | Status: DC

## 2020-09-22 NOTE — Telephone Encounter (Signed)
Refill sent as requested. 

## 2020-09-23 ENCOUNTER — Other Ambulatory Visit: Payer: Self-pay | Admitting: Cardiovascular Disease

## 2020-09-23 ENCOUNTER — Other Ambulatory Visit: Payer: Self-pay | Admitting: Endocrinology

## 2020-09-23 DIAGNOSIS — IMO0002 Reserved for concepts with insufficient information to code with codable children: Secondary | ICD-10-CM

## 2020-09-23 DIAGNOSIS — E1139 Type 2 diabetes mellitus with other diabetic ophthalmic complication: Secondary | ICD-10-CM

## 2020-09-27 ENCOUNTER — Ambulatory Visit: Payer: PPO | Admitting: Infectious Disease

## 2020-09-29 DIAGNOSIS — H43813 Vitreous degeneration, bilateral: Secondary | ICD-10-CM | POA: Diagnosis not present

## 2020-09-29 DIAGNOSIS — D3131 Benign neoplasm of right choroid: Secondary | ICD-10-CM | POA: Diagnosis not present

## 2020-09-29 DIAGNOSIS — H35013 Changes in retinal vascular appearance, bilateral: Secondary | ICD-10-CM | POA: Diagnosis not present

## 2020-09-29 DIAGNOSIS — E113213 Type 2 diabetes mellitus with mild nonproliferative diabetic retinopathy with macular edema, bilateral: Secondary | ICD-10-CM | POA: Diagnosis not present

## 2020-10-11 ENCOUNTER — Other Ambulatory Visit (HOSPITAL_COMMUNITY): Payer: Self-pay

## 2020-10-11 ENCOUNTER — Other Ambulatory Visit: Payer: Self-pay

## 2020-10-11 ENCOUNTER — Encounter: Payer: Self-pay | Admitting: Infectious Disease

## 2020-10-11 ENCOUNTER — Ambulatory Visit: Payer: PPO | Admitting: Infectious Disease

## 2020-10-11 VITALS — BP 167/91 | HR 64 | Wt 270.8 lb

## 2020-10-11 DIAGNOSIS — N1832 Chronic kidney disease, stage 3b: Secondary | ICD-10-CM

## 2020-10-11 DIAGNOSIS — M869 Osteomyelitis, unspecified: Secondary | ICD-10-CM | POA: Diagnosis not present

## 2020-10-11 DIAGNOSIS — E1342 Other specified diabetes mellitus with diabetic polyneuropathy: Secondary | ICD-10-CM | POA: Diagnosis not present

## 2020-10-11 DIAGNOSIS — A0472 Enterocolitis due to Clostridium difficile, not specified as recurrent: Secondary | ICD-10-CM | POA: Diagnosis not present

## 2020-10-11 DIAGNOSIS — I739 Peripheral vascular disease, unspecified: Secondary | ICD-10-CM

## 2020-10-11 NOTE — Progress Notes (Signed)
Subjective:  Chief complaint follow-up for diabetic foot infection   Patient ID: Martin Agee Sr., male    DOB: Oct 19, 1945, 75 y.o.   MRN: [id number]  HPI  Martin [name] is a 75 year-old Caucasian man with a past medical history significant for coronary artery disease status post coronary bypass grafting, diabetes mellitus peripheral artery disease who has been struggling with an ulcer in his foot for several years now.     Unfortunately his developed osteomyelitis.  He was referred to Dr. Sharol Given who offered him surgery but the patient was not ready to do this yet.   There is also concern about his ability to heal any surgery in his foot due to his peripheral vascular disease.   He may in fact require a proximal amputation such as a below the knee amputation for cure.   At present he is in a very difficult situation socially and that he is caring for his wife and is the sole caregiver.  His wife has metastatic breast cancer that metastasized to bone and she is currently bedbound.   He is not ready to have any type of surgery including surgery on the foot at this point in time but wants to see his wife improving first over more resolution on not from prior to allowing himself to be hospitalized for a surgical intervention.   He has been followed closely by the wound care clinic and Dr. Dellia Nims who gave him a prescription for Zyvox when the patient developed erythema that went up his leg and swelling.   He responded well to Zyvox he also had had DVT excluded through duplex.   He has had MRSA isolated from cultures from the wound center which has been resistant to tetracycline clindamycin fluoroquinolones but sensitive to Bactrim and vancomycin   The erythema in his leg had down considerably since he is received his course of Zyvox  I observed him off antibiotics but there was a great desire his part on the prior Dr. Dellia Nims to see what systemic antibiotics would do.  Therefore we started IV  daptomycin with a goal of getting through 76 weeks of therapy He states that after he had revascularization and initiated daptomycin that he had dramatic improvement in his wound which is now closed.  He said it improved within a matter of 2 weeks.  Did injure his toenail prior to last visit. And now it has come off.  His wound indeed had healed up quite nicely and continues to look dramatically improved.  I do continue to have anxiety about the underlying osteomyelitis.  Unfortunately there are not really many good oral options.  I did not want to hazard clindamycin and potentially cause C. difficile colitis.  He has high potassium at baseline and chronic kidney disease making Bactrim dangerous and undesirable antibiotic as well.  We could see if he might build to get pharmaceutical assistance for tedezolid or possibly omadacycline being a Medicare patient may make this more complicated.  We could also investigate this another option would be rounds of long-acting oritavancin or dalbavancin. I do not think though that these really viable long-term options.  Because of this we decided to observe him off of antibiotics and he seems to have been doing quite well.  He has had no clinical evidence of recurrence of his osteomyelitis.  He has been seen by interventional radiology to assess his blood supply.   .  Past Medical History:  Diagnosis Date   A-fib ([place])  CKD (chronic kidney disease) stage 3, GFR 30-59 ml/min (HCC) 08/29/2020   Diabetes mellitus    Heart attack ([place]) 10/01/2018   Pt had open heart surgery   Hyperkalemia 08/29/2020   Hypertension    Osteomyelitis of fifth toe of left foot ([place]) 05/08/2020   Simple chronic bronchitis ([place]) 05/08/2020   Tobacco abuse     Past Surgical History:  Procedure Laterality Date   CORONARY ARTERY BYPASS GRAFT N/A 10/01/2018   Procedure: CORONARY ARTERY BYPASS GRAFTING (CABG) TIMES  FOUR USING LEFT MAMMARY ARTERY AND RIGHT GREATER SAPHEANOUS VEIN  HARVESTED ENDOSCOPICALLY;  Surgeon: Melrose Nakayama, MD;  Location: [facility];  Service: Open Heart Surgery;  Laterality: N/A;   HERNIA REPAIR     >10 years ago   IR ANGIOGRAM EXTREMITY LEFT  06/16/2020   IR RADIOLOGIST EVAL & MGMT  12/24/2016   IR RADIOLOGIST EVAL & MGMT  05/30/2020   IR RADIOLOGIST EVAL & MGMT  07/13/2020   IR RADIOLOGIST EVAL & MGMT  09/06/2020   IR THORACENTESIS ASP PLEURAL SPACE W/IMG GUIDE  10/06/2018   IR US GUIDE VASC ACCESS RIGHT  06/16/2020   LEFT HEART CATH AND CORONARY ANGIOGRAPHY N/A 09/29/2018   Procedure: LEFT HEART CATH AND CORONARY ANGIOGRAPHY;  Surgeon: Troy Sine, MD;  Location: [facility];  Service: Cardiovascular;  Laterality: N/A;   open heart surfery     right shoulder surgery     around 2014   TEE WITHOUT CARDIOVERSION N/A 10/01/2018   Procedure: TRANSESOPHAGEAL ECHOCARDIOGRAM (TEE);  Surgeon: Melrose Nakayama, MD;  Location: [facility];  Service: Open Heart Surgery;  Laterality: N/A;    Family History  Problem Relation Age of Onset   Hypertension Mother    Heart disease Mother        CHF did not see doctor   Diabetes Maternal Grandmother    Diabetes Son       Social History   Socioeconomic History   Marital status: Married    Spouse name: Not on file   Number of children: Not on file   Years of education: Not on file   Highest education level: Not on file  Occupational History   Not on file  Tobacco Use   Smoking status: Former    Packs/day: 0.75    Years: 2.00    Pack years: 1.50    Types: Cigarettes    Start date: 2002    Quit date: 04/29/2002    Years since quitting: 18.4   Smokeless tobacco: Never   Tobacco comments:    smoked for 10 years on and off  Substance and Sexual Activity   Alcohol use: Yes    Alcohol/week: 0.0 standard drinks    Comment: 6 martinis a year   Drug use: No   Sexual activity: Yes  Other Topics Concern   Not on file  Social History Narrative   Married. 3 kids. 7 grandkids. No  greatgrandkids.       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 To be taken at first sign of cellulitis around foot (Patient taking differently: Take 600 mg by mouth 2 (two) times daily as needed. To be taken at first sign of cellulitis around foot), Disp: 28 tablet, Rfl: 3   aspirin 81 MG EC tablet, Take 1 tablet (81 mg total) by mouth daily., Disp: , Rfl:    azelastine (ASTELIN) 0.1 % nasal spray, Place 1 spray into both nostrils 2 (two) times daily., Disp: 30 mL, Rfl: 12   clopidogrel (PLAVIX) 75 MG tablet, Take 1 tablet (75 mg total) by mouth daily., Disp: 90 tablet, Rfl: 0   DAPTOmycin (CUBICIN) 500 MG injection, Inject into the vein. (Patient not taking: Reported on 10/11/2020), Disp: , Rfl:    DAPTOmycin 350 MG SOLR, Inject into the vein. (Patient not taking: Reported on 10/11/2020), Disp: , Rfl:    ezetimibe (ZETIA) 10 MG tablet, TAKE 1 TABLET BY MOUTH EVERY DAY, Disp: 30 tablet, Rfl: 0   fexofenadine (ALLEGRA) 180 MG tablet, Take 180 mg by mouth daily as needed for allergies or rhinitis., Disp: , Rfl:    fluticasone (FLONASE) 50 MCG/ACT nasal spray, Place 2 sprays into both nostrils daily as needed for allergies or rhinitis., Disp: , Rfl:    insulin aspart (NOVOLOG FLEXPEN) 100 UNIT/ML FlexPen, Inject 20-25 Units into the skin See admin instructions. 3 times a day (just before each meal) 25-20-25 units, and pen needles 4/day,  Disp: 45 mL, Rfl: 1   insulin glargine (LANTUS SOLOSTAR) 100 UNIT/ML Solostar Pen, Inject 10 Units into the skin at bedtime., Disp: , Rfl:    Insulin Pen Needle (NOVOFINE) 32G X 6 MM MISC, USE 5 TIMES DAILY, Disp: 450 each, Rfl: 2   LIVALO 1 MG TABS, TAKE 1 TABLET BY MOUTH EVERY DAY (Patient taking differently: Take 1 mg by mouth daily.), Disp: 90 tablet, Rfl: 3   losartan (COZAAR) 25 MG tablet, Take 1 tablet (25 mg total) by mouth in the morning and at bedtime., Disp: 180 tablet, Rfl: 3   metoprolol tartrate (LOPRESSOR) 50 MG tablet, TAKE 1 TABLET BY MOUTH TWICE A DAY (Patient taking differently: Take 50 mg by mouth 2 (two) times daily.), Disp: 180 tablet, Rfl: 1   ONETOUCH VERIO test strip, CHECK LEVELS 4 TIMES DAILY, Disp: 150 strip, Rfl: 1   torsemide (DEMADEX) 20 MG tablet, TAKE 2 TABLETS BY MOUTH EVERY DAY, Disp: 60 tablet, Rfl: 0   vitamin C (ASCORBIC ACID) 250 MG tablet, Take 250 mg by mouth daily., Disp: , Rfl:    Review of Systems  Constitutional:  Negative for chills and fever.  HENT:  Negative for congestion and sore throat.   Eyes:  Negative for photophobia, pain and redness.  Respiratory:  Negative for cough, shortness of breath and wheezing.   Cardiovascular:  Negative for chest pain, palpitations and leg swelling.  Gastrointestinal:  Negative for abdominal pain, blood in stool, constipation, diarrhea, nausea and vomiting.  Endocrine: Negative for polydipsia and polyuria.  Genitourinary:  Negative for difficulty urinating, dysuria, flank pain, frequency and hematuria.  Musculoskeletal:  Negative for back pain and myalgias.  Skin:  Positive for wound. Negative for rash.  Neurological:  Negative for dizziness, tremors, seizures, syncope, facial asymmetry, speech difficulty, weakness, light-headedness and numbness.  Hematological:  Does not bruise/bleed easily.  Psychiatric/Behavioral:  Negative for confusion and suicidal ideas.  The patient is not nervous/anxious and is not  hyperactive.       Objective:   Physical Exam Constitutional:      General: He is not in acute distress.    Appearance: Normal appearance. He is well-developed. He is not ill-appearing or diaphoretic.  HENT:     Head: Normocephalic and atraumatic.     Right Ear: Hearing and external ear normal.     Left Ear: Hearing and external ear normal.     Nose: No nasal deformity or rhinorrhea.  Eyes:     General: No scleral icterus.    Conjunctiva/sclera: Conjunctivae normal.     Right eye: Right conjunctiva is not injected.     Left eye: Left conjunctiva is not injected.     Pupils: Pupils are equal, round, and reactive to light.  Neck:     Vascular: No JVD.  Cardiovascular:     Rate and Rhythm: Normal rate and regular rhythm.     Heart sounds: S1 normal and S2 normal.  Abdominal:     General: There is no distension.     Palpations: Abdomen is soft.     Tenderness: There is no abdominal tenderness.  Musculoskeletal:        General: Normal range of motion.     Right shoulder: Normal.     Left shoulder: Normal.     Cervical back: Normal range of motion and neck supple.     Right hip: Normal.     Left hip: Normal.     Right knee: Normal.     Left knee: Normal.  Lymphadenopathy:     Head:     Right side of head: No submandibular, preauricular or posterior auricular adenopathy.     Left side of head: No submandibular, preauricular or posterior auricular adenopathy.     Cervical: No cervical adenopathy.     Right cervical: No superficial or deep cervical adenopathy.    Left cervical: No superficial or deep cervical adenopathy.  Skin:    General: Skin is warm and dry.     Coloration: Skin is not jaundiced or pale.     Findings: No abrasion, bruising, ecchymosis, erythema, lesion or rash.     Nails: There is no clubbing.  Neurological:     General: No focal deficit present.     Mental Status: He is alert and oriented to person, place, and time.     Sensory: No sensory deficit.      Coordination: Coordination normal.     Gait: Gait normal.  Psychiatric:        Attention and Perception: He is attentive.        Mood and Affect: Mood normal.        Speech: Speech normal.        Behavior: Behavior normal. Behavior is cooperative.        Thought Content: Thought content normal.        Judgment: Judgment normal.   Foot   06/05/2020:        06/16/2020:       08/02/2020:     Foot today Aug 29, 2020:     Toenail 08/28/2020:     10/11/2020:        Assessment & Plan:  Osteomyelitis of the foot with peripheral vascular disease status post revascularization and daptomycin continues on.   While his wound is healed up I am skeptical that we are going to build to cure his actual osteomyelitis   I  will confirm with  ID pharmacy what tedezolid or omadacycline (if latter might be active) would be covered at all  Now we are proceeding with observing him off antibiotics and he seems to doing clinically well.  We will check basic labs today with inflammatory markers and plan on seeing him back in 2 months time.  He does have a bottle of Zyvox at home that he can take as soon as any problems emerge.  Certainly if his infection reemerges and osteomyelitis declares itself again he could get curative amputation with Dr. Sharol Given if the patient is able to be able to take time off to go through the surgery.   PVD sp vascularization and seeing interventional radiology next week.  Hypertension blood pressures been higher while getting volumes of antibiotics.  Diabetes mellitus followed by PCP  Loose toenail with onychomycosis: toenal came off on its own  Chronic kidney disease and hyperkalemia: These really preclude the use of Bactrim   History of C. difficile colitis: This precludes the use of clindamycin  I spent more than  40 minutes with the patient including greater than 50% of time in face to face counseling of the patient personally reviewing radiographs,  along with pertinent laboratory microbiological data review of medical records and in coordination of his care.

## 2020-10-12 LAB — CBC WITH DIFFERENTIAL/PLATELET
Absolute Monocytes: 599 cells/uL (ref 200–950)
Basophils Absolute: 22 cells/uL (ref 0–200)
Basophils Relative: 0.3 %
Eosinophils Absolute: 190 cells/uL (ref 15–500)
Eosinophils Relative: 2.6 %
HCT: 39.5 % (ref 38.5–50.0)
Hemoglobin: 12.4 g/dL — ABNORMAL LOW (ref 13.2–17.1)
Lymphs Abs: 2227 cells/uL (ref 850–3900)
MCH: 27.6 pg (ref 27.0–33.0)
MCHC: 31.4 g/dL — ABNORMAL LOW (ref 32.0–36.0)
MCV: 87.8 fL (ref 80.0–100.0)
MPV: 9.9 fL (ref 7.5–12.5)
Monocytes Relative: 8.2 %
Neutro Abs: 4263 cells/uL (ref 1500–7800)
Neutrophils Relative %: 58.4 %
Platelets: 306 10*3/uL (ref 140–400)
RBC: 4.5 10*6/uL (ref 4.20–5.80)
RDW: 14 % (ref 11.0–15.0)
Total Lymphocyte: 30.5 %
WBC: 7.3 10*3/uL (ref 3.8–10.8)

## 2020-10-12 LAB — BASIC METABOLIC PANEL WITH GFR
BUN/Creatinine Ratio: 14 (calc) (ref 6–22)
BUN: 20 mg/dL (ref 7–25)
CO2: 28 mmol/L (ref 20–32)
Calcium: 9.3 mg/dL (ref 8.6–10.3)
Chloride: 102 mmol/L (ref 98–110)
Creat: 1.4 mg/dL — ABNORMAL HIGH (ref 0.70–1.18)
GFR, Est African American: 56 mL/min/{1.73_m2} — ABNORMAL LOW (ref >=60)
GFR, Est Non African American: 48 mL/min/{1.73_m2} — ABNORMAL LOW (ref >=60)
Glucose, Bld: 183 mg/dL — ABNORMAL HIGH (ref 65–99)
Potassium: 5.4 mmol/L — ABNORMAL HIGH (ref 3.5–5.3)
Sodium: 138 mmol/L (ref 135–146)

## 2020-10-12 LAB — C-REACTIVE PROTEIN: CRP: 4.1 mg/L (ref <8.0)

## 2020-10-12 LAB — SEDIMENTATION RATE: Sed Rate: 22 mm/h — ABNORMAL HIGH (ref 0–20)

## 2020-10-20 ENCOUNTER — Other Ambulatory Visit: Payer: Self-pay | Admitting: Endocrinology

## 2020-10-20 DIAGNOSIS — IMO0002 Reserved for concepts with insufficient information to code with codable children: Secondary | ICD-10-CM

## 2020-10-20 DIAGNOSIS — E1139 Type 2 diabetes mellitus with other diabetic ophthalmic complication: Secondary | ICD-10-CM

## 2020-10-22 ENCOUNTER — Telehealth: Payer: PPO | Admitting: Emergency Medicine

## 2020-10-22 DIAGNOSIS — J019 Acute sinusitis, unspecified: Secondary | ICD-10-CM

## 2020-10-22 MED ORDER — AMOXICILLIN-POT CLAVULANATE 875-125 MG PO TABS: 1.0000 | ORAL_TABLET | Freq: Two times a day (BID) | ORAL | 0 refills | Status: DC

## 2020-10-22 NOTE — Progress Notes (Signed)

## 2020-10-26 ENCOUNTER — Other Ambulatory Visit: Payer: Self-pay | Admitting: Cardiovascular Disease

## 2020-10-26 NOTE — Telephone Encounter (Signed)
Rx(s) sent to pharmacy electronically.  

## 2020-11-07 ENCOUNTER — Other Ambulatory Visit: Payer: Self-pay | Admitting: Interventional Radiology

## 2020-11-07 DIAGNOSIS — E08621 Diabetes mellitus due to underlying condition with foot ulcer: Secondary | ICD-10-CM

## 2020-11-30 ENCOUNTER — Other Ambulatory Visit: Payer: Self-pay | Admitting: Cardiovascular Disease

## 2020-12-11 ENCOUNTER — Telehealth: Payer: Self-pay | Admitting: Family Medicine

## 2020-12-11 NOTE — Progress Notes (Signed)
  Care Management   Follow Up Note   12/11/2020 Name: Martin [name] [facility] Sr. MRN: [id number] DOB: 10-20-1945   Referred by: Marin Olp, MD Reason for referral : No chief complaint on file.   An unsuccessful telephone outreach was attempted today. The patient was referred to the case management team for assistance with care management and care coordination.   Follow Up Plan: The care management team will reach out to the patient again over the next 7 days.   [place]

## 2020-12-15 ENCOUNTER — Other Ambulatory Visit: Payer: Self-pay | Admitting: Cardiovascular Disease

## 2020-12-15 DIAGNOSIS — H35013 Changes in retinal vascular appearance, bilateral: Secondary | ICD-10-CM | POA: Diagnosis not present

## 2020-12-15 DIAGNOSIS — D3131 Benign neoplasm of right choroid: Secondary | ICD-10-CM | POA: Diagnosis not present

## 2020-12-15 DIAGNOSIS — E113213 Type 2 diabetes mellitus with mild nonproliferative diabetic retinopathy with macular edema, bilateral: Secondary | ICD-10-CM | POA: Diagnosis not present

## 2020-12-15 DIAGNOSIS — H40023 Open angle with borderline findings, high risk, bilateral: Secondary | ICD-10-CM | POA: Diagnosis not present

## 2020-12-18 ENCOUNTER — Encounter: Payer: Self-pay | Admitting: Infectious Disease

## 2020-12-18 ENCOUNTER — Ambulatory Visit: Payer: PPO | Admitting: Infectious Disease

## 2020-12-18 ENCOUNTER — Other Ambulatory Visit: Payer: Self-pay

## 2020-12-18 ENCOUNTER — Ambulatory Visit (INDEPENDENT_AMBULATORY_CARE_PROVIDER_SITE_OTHER): Payer: PPO | Admitting: Endocrinology

## 2020-12-18 ENCOUNTER — Encounter: Payer: Self-pay | Admitting: Endocrinology

## 2020-12-18 VITALS — BP 168/92 | HR 78 | Ht 72.0 in | Wt 272.0 lb

## 2020-12-18 VITALS — BP 181/99 | HR 78 | Temp 98.1°F | Wt 273.0 lb

## 2020-12-18 DIAGNOSIS — E1342 Other specified diabetes mellitus with diabetic polyneuropathy: Secondary | ICD-10-CM | POA: Diagnosis not present

## 2020-12-18 DIAGNOSIS — J301 Allergic rhinitis due to pollen: Secondary | ICD-10-CM

## 2020-12-18 DIAGNOSIS — E1165 Type 2 diabetes mellitus with hyperglycemia: Secondary | ICD-10-CM

## 2020-12-18 DIAGNOSIS — E1159 Type 2 diabetes mellitus with other circulatory complications: Secondary | ICD-10-CM | POA: Diagnosis not present

## 2020-12-18 DIAGNOSIS — A0472 Enterocolitis due to Clostridium difficile, not specified as recurrent: Secondary | ICD-10-CM | POA: Diagnosis not present

## 2020-12-18 DIAGNOSIS — I152 Hypertension secondary to endocrine disorders: Secondary | ICD-10-CM

## 2020-12-18 DIAGNOSIS — N1832 Chronic kidney disease, stage 3b: Secondary | ICD-10-CM | POA: Diagnosis not present

## 2020-12-18 DIAGNOSIS — IMO0002 Reserved for concepts with insufficient information to code with codable children: Secondary | ICD-10-CM

## 2020-12-18 DIAGNOSIS — M869 Osteomyelitis, unspecified: Secondary | ICD-10-CM

## 2020-12-18 DIAGNOSIS — E1139 Type 2 diabetes mellitus with other diabetic ophthalmic complication: Secondary | ICD-10-CM | POA: Diagnosis not present

## 2020-12-18 DIAGNOSIS — I739 Peripheral vascular disease, unspecified: Secondary | ICD-10-CM | POA: Diagnosis not present

## 2020-12-18 LAB — POCT GLYCOSYLATED HEMOGLOBIN (HGB A1C): Hemoglobin A1C: 9.1 % — AB (ref 4.0–5.6)

## 2020-12-18 MED ORDER — NOVOLOG FLEXPEN 100 UNIT/ML ~~LOC~~ SOPN: 25.0000 [IU] | PEN_INJECTOR | Freq: Three times a day (TID) | SUBCUTANEOUS | 3 refills | Status: DC

## 2020-12-18 MED ORDER — OZEMPIC (0.25 OR 0.5 MG/DOSE) 2 MG/1.5ML ~~LOC~~ SOPN: 0.5000 mg | PEN_INJECTOR | SUBCUTANEOUS | 3 refills | Status: DC

## 2020-12-18 MED ORDER — FREESTYLE LIBRE 2 READER DEVI: 1.0000 | Freq: Once | 1 refills | Status: AC

## 2020-12-18 MED ORDER — FREESTYLE LIBRE 2 SENSOR MISC: 1.0000 | 3 refills | Status: DC

## 2020-12-18 NOTE — Progress Notes (Signed)
Subjective:    Patient ID: Martin Agee Sr., male    DOB: 09-21-1945, 75 y.o.   MRN: [id number]  HPI Pt returns for f/u of diabetes mellitus:  DM type: Insulin-requiring type 2.  Dx'ed: 123XX123 Complications: PN, foot ulcer, CRI, PAD, CAD, and DR.   Therapy: insulin since 2013.  DKA: never.   Severe hypoglycemia: never.  Pancreatitis: never.   Other: he takes multiple daily injections; he declines weight loss surgery; he eats 2-3 meals per day.   Interval history: Pt says cbg varies from 62-309.  It is still in general highest in the afternoon and fasting, and lowest in the afternoon.  pt states he feels well in general.  He says he never misses the insulin.  He takes Novolog 25 units 3 times a day (just before each meal).  He has not recently taken Lantus.   Past Medical History:  Diagnosis Date   A-fib ([place])    CKD (chronic kidney disease) stage 3, GFR 30-59 ml/min (HCC) 08/29/2020   Diabetes mellitus    Heart attack ([place]) 10/01/2018   Pt had open heart surgery   Hyperkalemia 08/29/2020   Hypertension    Osteomyelitis of fifth toe of left foot ([place]) 05/08/2020   Simple chronic bronchitis ([place]) 05/08/2020   Tobacco abuse     Past Surgical History:  Procedure Laterality Date   CORONARY ARTERY BYPASS GRAFT N/A 10/01/2018   Procedure: CORONARY ARTERY BYPASS GRAFTING (CABG) TIMES  FOUR USING LEFT MAMMARY ARTERY AND RIGHT GREATER SAPHEANOUS VEIN HARVESTED ENDOSCOPICALLY;  Surgeon: Melrose Nakayama, MD;  Location: [facility];  Service: Open Heart Surgery;  Laterality: N/A;   HERNIA REPAIR     >10 years ago   IR ANGIOGRAM EXTREMITY LEFT  06/16/2020   IR RADIOLOGIST EVAL & MGMT  12/24/2016   IR RADIOLOGIST EVAL & MGMT  05/30/2020   IR RADIOLOGIST EVAL & MGMT  07/13/2020   IR RADIOLOGIST EVAL & MGMT  09/06/2020   IR THORACENTESIS ASP PLEURAL SPACE W/IMG GUIDE  10/06/2018   IR US GUIDE VASC ACCESS RIGHT  06/16/2020   LEFT HEART CATH AND CORONARY ANGIOGRAPHY N/A 09/29/2018   Procedure: LEFT HEART CATH  AND CORONARY ANGIOGRAPHY;  Surgeon: Troy Sine, MD;  Location: [facility];  Service: Cardiovascular;  Laterality: N/A;   open heart surfery     right shoulder surgery     around 2014   TEE WITHOUT CARDIOVERSION N/A 10/01/2018   Procedure: TRANSESOPHAGEAL ECHOCARDIOGRAM (TEE);  Surgeon: Melrose Nakayama, MD;  Location: [facility];  Service: Open Heart Surgery;  Laterality: N/A;    Social History   Socioeconomic History   Marital status: Married    Spouse name: Not on file   Number of children: Not on file   Years of education: Not on file   Highest education level: Not on file  Occupational History   Not on file  Tobacco Use   Smoking status: Former    Packs/day: 0.75    Years: 2.00    Pack years: 1.50    Types: Cigarettes    Start date: 2002    Quit date: 04/29/2002    Years since quitting: 18.6   Smokeless tobacco: Never   Tobacco comments:    smoked for 10 years on and off  Substance and Sexual Activity   Alcohol use: Yes    Alcohol/week: 0.0 standard drinks    Comment: 6 martinis a year   Drug use: No   Sexual  activity: Yes  Other Topics Concern   Not on file  Social History Narrative   Married. 3 kids. 7 grandkids. No greatgrandkids.       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     azelastine (ASTELIN) 0.1 % nasal spray Place 1 spray into both nostrils 2 (two) times daily. 30 mL 12   clopidogrel (PLAVIX) 75 MG tablet Take 1 tablet (75 mg total) by mouth daily.  90 tablet 0   ezetimibe (ZETIA) 10 MG tablet TAKE 1 TABLET (10 MG TOTAL) BY MOUTH DAILY. NEED APPT 15 tablet 0   fexofenadine (ALLEGRA) 180 MG tablet Take 180 mg by mouth daily as needed for allergies or rhinitis.     fluticasone (FLONASE) 50 MCG/ACT nasal spray Place 2 sprays into both nostrils daily as needed for allergies or rhinitis.     Insulin Pen Needle (NOVOFINE) 32G X 6 MM MISC USE 5 TIMES DAILY 450 each 2   LIVALO 1 MG TABS TAKE 1 TABLET BY MOUTH EVERY DAY (Patient taking differently: Take 1 mg by mouth daily.) 90 tablet 3   losartan (COZAAR) 25 MG tablet Take 1 tablet (25 mg total) by mouth in the morning and at bedtime. 180 tablet 3   metoprolol tartrate (LOPRESSOR) 50 MG tablet TAKE 1 TABLET BY MOUTH TWICE A DAY (Patient taking differently: Take 50 mg by mouth 2 (two) times daily.) 180 tablet 1   ONETOUCH VERIO test strip CHECK LEVELS 4 TIMES DAILY 100 strip 2   torsemide (DEMADEX) 20 MG tablet MUST MAKE APPOINTMENT FOR FUTURE REFILL LAST ATTEMPT. TAKE 2 TABLETS BY MOUTH EVERY DAY 30 tablet 0   vitamin C (ASCORBIC ACID) 250 MG tablet Take 250 mg by mouth daily.     [DISCONTINUED] DAPTOmycin (CUBICIN) 500 MG injection Inject into the vein. (Patient not taking: Reported on 10/22/2020)     [DISCONTINUED] DAPTOmycin 350 MG SOLR Inject into the vein. (Patient not taking: No sig reported)     No current facility-administered medications on file prior to visit.    Allergies  Allergen Reactions   Simvastatin Diarrhea   Statins Other (See Comments)    achey joints   Xanax [Alprazolam] Anxiety    Family History  Problem Relation Age of Onset   Hypertension Mother    Heart disease Mother        CHF did not see doctor   Diabetes Maternal Grandmother    Diabetes Son     BP (!) 168/92   Pulse 78   Ht 6' (1.829 m)   Wt 272 lb (123.4 kg)   SpO2 96%   BMI 36.89 kg/m    Review of Systems     Objective:   Physical Exam Pulses: dorsalis pedis intact bilat.  MSK: no deformity  of the feet, except left little toe overlaps the 4th.   CV: 2+ bilat leg edema.  Skin: healed ulcer at the left great toe.  normal temp  on the feet. Mild erythema of the legs and left foot.     Neuro: sensation is intact to touch on the feet, but severely decreased from normal.  Ext: There is severe bilateral onychomycosis of the toenails.     Lab Results  Component Value Date   HGBA1C 9.1 (A) 12/18/2020      Assessment & Plan:  Insulin-requiring type 2 DM: uncontrolled Hypoglycemia, due to insulin: I rx'ed continuous glucose monitor.    Patient Instructions  Your blood pressure is high today.  Please see your primary care provider soon, to have it rechecked I have sent a prescription to your pharmacy, to add "Ozempic," and: Please continue the same Novolog.  For the Ozempic, take just 0.25 mg the first week.   check your blood sugar twice a day.  vary the time of day when you check, between before the 3 meals, and at bedtime.  also check if you have symptoms of your blood sugar being too high or too low.  please keep a record of the readings and bring it to your next appointment here (or you can bring the meter itself).  You can write it on any piece of paper.  please call us sooner if your blood sugar goes below 70, or if you have a lot of readings over 200.   Please have another video visit in 2 months.

## 2020-12-18 NOTE — Progress Notes (Signed)
Subjective:   Chief complaint: Follow-up for diabetic foot infection also with some postnasal drip    Patient ID: Martin Agee Sr., male    DOB: Sep 19, 1945, 75 y.o.   MRN: [id number]  HPI  [name] is a 75 year-old Caucasian man with a past medical history significant for coronary artery disease status post coronary bypass grafting, diabetes mellitus peripheral artery disease who has been struggling with an ulcer in his foot for several years now.     Unfortunately his developed osteomyelitis.  He was referred to Dr. Sharol Given who offered him surgery but the patient was not ready to do this yet.   There is also concern about his ability to heal any surgery in his foot due to his peripheral vascular disease.   He may in fact require a proximal amputation such as a below the knee amputation for cure.   At present he is in a very difficult situation socially and that he is caring for his wife and is the sole caregiver.  His wife has metastatic breast cancer that metastasized to bone and she is currently bedbound.   He is not ready to have any type of surgery including surgery on the foot at this point in time but wants to see his wife improving first over more resolution on not from prior to allowing himself to be hospitalized for a surgical intervention.   He has been followed closely by the wound care clinic and Dr. Dellia Nims who gave him a prescription for Zyvox when the patient developed erythema that went up his leg and swelling.   He responded well to Zyvox he also had had DVT excluded through duplex.   He has had MRSA isolated from cultures from the wound center which has been resistant to tetracycline clindamycin fluoroquinolones but sensitive to Bactrim and vancomycin   The erythema in his leg had down considerably since he is received his course of Zyvox  I observed him off antibiotics but there was a great desire his part on the prior Dr. Dellia Nims to see what systemic antibiotics  would do.  Therefore we started IV daptomycin with a goal of getting through 8 weeks of therapy He states that after he had revascularization and initiated daptomycin that he had dramatic improvement in his wound which is now closed.  He said it improved within a matter of 2 weeks.    His wound indeed had healed up quite nicely and continues to look dramatically improved.  I do continue to have anxiety about the underlying osteomyelitis.  Unfortunately there are not really many good oral options.  I did not want to hazard clindamycin and potentially cause C. difficile colitis.  He has high potassium at baseline and chronic kidney disease making Bactrim dangerous and undesirable antibiotic as well.  We could see if he might build to get pharmaceutical assistance for tedezolid or possibly omadacycline being a Medicare patient may make this more complicated.  We could also investigate this another option would be rounds of long-acting oritavancin or dalbavancin. I do not think though that these really viable long-term options.  Because of this we decided to observe him off of antibiotics and he seems to have been doing quite well.  He has had no clinical evidence of recurrence of his osteomyelitis.  He has been seen by interventional radiology to assess his blood supply to his last visit with [place].  His foot continues to appear well.  There is no drainage from the  wound.  He does not experience pain at that site but that is confounded by his neuropathy.  His inflammatory markers have trended down.  He has no systemic evidence of infection.  He has had chronic postnasal drip over the last year which is caused him to cough and he has had some anxiety about pneumonia but has had no evidence for this on chest x-ray.     .  Past Medical History:  Diagnosis Date   A-fib ([place])    CKD (chronic kidney disease) stage 3, GFR 30-59 ml/min (HCC) 08/29/2020   Diabetes mellitus    Heart attack ([place])  10/01/2018   Pt had open heart surgery   Hyperkalemia 08/29/2020   Hypertension    Osteomyelitis of fifth toe of left foot ([place]) 05/08/2020   Simple chronic bronchitis ([place]) 05/08/2020   Tobacco abuse     Past Surgical History:  Procedure Laterality Date   CORONARY ARTERY BYPASS GRAFT N/A 10/01/2018   Procedure: CORONARY ARTERY BYPASS GRAFTING (CABG) TIMES  FOUR USING LEFT MAMMARY ARTERY AND RIGHT GREATER SAPHEANOUS VEIN HARVESTED ENDOSCOPICALLY;  Surgeon: Melrose Nakayama, MD;  Location: [facility];  Service: Open Heart Surgery;  Laterality: N/A;   HERNIA REPAIR     >10 years ago   IR ANGIOGRAM EXTREMITY LEFT  06/16/2020   IR RADIOLOGIST EVAL & MGMT  12/24/2016   IR RADIOLOGIST EVAL & MGMT  05/30/2020   IR RADIOLOGIST EVAL & MGMT  07/13/2020   IR RADIOLOGIST EVAL & MGMT  09/06/2020   IR THORACENTESIS ASP PLEURAL SPACE W/IMG GUIDE  10/06/2018   IR US GUIDE VASC ACCESS RIGHT  06/16/2020   LEFT HEART CATH AND CORONARY ANGIOGRAPHY N/A 09/29/2018   Procedure: LEFT HEART CATH AND CORONARY ANGIOGRAPHY;  Surgeon: Troy Sine, MD;  Location: [facility];  Service: Cardiovascular;  Laterality: N/A;   open heart surfery     right shoulder surgery     around 2014   TEE WITHOUT CARDIOVERSION N/A 10/01/2018   Procedure: TRANSESOPHAGEAL ECHOCARDIOGRAM (TEE);  Surgeon: Melrose Nakayama, MD;  Location: [facility];  Service: Open Heart Surgery;  Laterality: N/A;    Family History  Problem Relation Age of Onset   Hypertension Mother    Heart disease Mother        CHF did not see doctor   Diabetes Maternal Grandmother    Diabetes Son       Social History   Socioeconomic History   Marital status: Married    Spouse name: Not on file   Number of children: Not on file   Years of education: Not on file   Highest education level: Not on file  Occupational History   Not on file  Tobacco Use   Smoking status: Former    Packs/day: 0.75    Years: 2.00    Pack years: 1.50    Types: Cigarettes     Start date: 2002    Quit date: 04/29/2002    Years since quitting: 18.6   Smokeless tobacco: Never   Tobacco comments:    smoked for 10 years on and off  Substance and Sexual Activity   Alcohol use: Yes    Alcohol/week: 0.0 standard drinks    Comment: 6 martinis a year   Drug use: No   Sexual activity: Yes  Other Topics Concern   Not on file  Social History Narrative   Married. 3 kids. 7 grandkids. No greatgrandkids.       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      Hobbies: golf previously, yardwork   Social Determinants of Health   Financial Resource Strain: Not on file  Food Insecurity: Not on file  Transportation Needs: Not on file  Physical Activity: Not on file  Stress: Not on file  Social Connections: Not on file    Allergies  Allergen Reactions   Simvastatin Diarrhea   Statins Other (See Comments)    achey joints   Xanax [Alprazolam] Anxiety     Current Outpatient Medications:    aspirin 81 MG EC tablet, Take 1 tablet (81 mg total) by mouth daily., Disp: , Rfl:    azelastine (ASTELIN) 0.1 % nasal spray, Place 1 spray into both nostrils 2 (two) times daily., Disp: 30 mL, Rfl: 12   clopidogrel (PLAVIX) 75 MG tablet, Take 1 tablet (75 mg total) by mouth daily., Disp: 90 tablet, Rfl: 0   ezetimibe (ZETIA) 10 MG tablet, TAKE 1 TABLET (10 MG TOTAL) BY MOUTH DAILY. NEED APPT, Disp: 15 tablet, Rfl: 0   fexofenadine (ALLEGRA) 180 MG tablet, Take 180 mg by mouth daily as needed for allergies or rhinitis., Disp: , Rfl:    fluticasone (FLONASE) 50 MCG/ACT nasal spray, Place 2 sprays into both nostrils daily as needed for allergies or rhinitis., Disp: , Rfl:    insulin aspart (NOVOLOG FLEXPEN) 100 UNIT/ML FlexPen, Inject 25 Units into the skin 3 (three) times daily with meals. 3 times a day (just before each meal) 25-20-25 units, and pen needles 4/day, Disp: 75 mL, Rfl: 3   Insulin Pen Needle (NOVOFINE) 32G X 6 MM MISC, USE 5 TIMES DAILY, Disp:  450 each, Rfl: 2   LIVALO 1 MG TABS, TAKE 1 TABLET BY MOUTH EVERY DAY (Patient taking differently: Take 1 mg by mouth daily.), Disp: 90 tablet, Rfl: 3   losartan (COZAAR) 25 MG tablet, Take 1 tablet (25 mg total) by mouth in the morning and at bedtime., Disp: 180 tablet, Rfl: 3   metoprolol tartrate (LOPRESSOR) 50 MG tablet, TAKE 1 TABLET BY MOUTH TWICE A DAY (Patient taking differently: Take 50 mg by mouth 2 (two) times daily.), Disp: 180 tablet, Rfl: 1   ONETOUCH VERIO test strip, CHECK LEVELS 4 TIMES DAILY, Disp: 100 strip, Rfl: 2   Semaglutide,0.25 or 0.'5MG'$ /DOS, (OZEMPIC, 0.25 OR 0.5 MG/DOSE,) 2 MG/1.5ML SOPN, Inject 0.5 mg into the skin once a week., Disp: 4.5 mL, Rfl: 3   torsemide (DEMADEX) 20 MG tablet, MUST MAKE APPOINTMENT FOR FUTURE REFILL LAST ATTEMPT. TAKE 2 TABLETS BY MOUTH EVERY DAY, Disp: 30 tablet, Rfl: 0   vitamin C (ASCORBIC ACID) 250 MG tablet, Take 250 mg by mouth daily., Disp: , Rfl:    Review of Systems  Constitutional:  Negative for activity change, appetite change, chills, diaphoresis, fatigue, fever and unexpected weight change.  HENT:  Negative for congestion, rhinorrhea, sinus pressure, sneezing, sore throat and trouble swallowing.   Eyes:  Negative for photophobia and visual disturbance.  Respiratory:  Negative for cough, chest tightness, shortness of breath, wheezing and stridor.   Cardiovascular:  Negative for chest pain, palpitations and leg swelling.  Gastrointestinal:  Negative for abdominal distention, abdominal pain, anal bleeding, blood in stool, constipation, diarrhea, nausea and vomiting.  Genitourinary:  Negative for difficulty urinating, dysuria, flank pain and hematuria.  Musculoskeletal:  Negative for arthralgias, back pain, gait problem, joint swelling and myalgias.  Skin:  Positive for wound. Negative for color change, pallor and rash.  Neurological:  Negative for dizziness, tremors, weakness and light-headedness.  Hematological:  Negative for  adenopathy. Does not bruise/bleed easily.  Psychiatric/Behavioral:  Negative for agitation, behavioral problems, confusion, decreased concentration, dysphoric mood and sleep disturbance.       Objective:   Physical Exam Constitutional:      Appearance: He is well-developed.  HENT:     Head: Normocephalic and atraumatic.  Eyes:     Conjunctiva/sclera: Conjunctivae normal.  Cardiovascular:     Rate and Rhythm: Normal rate and regular rhythm.  Pulmonary:     Effort: Pulmonary effort is normal. No respiratory distress.     Breath sounds: No wheezing.  Abdominal:     General: There is no distension.     Palpations: Abdomen is soft.  Musculoskeletal:        General: No tenderness. Normal range of motion.     Cervical back: Normal range of motion and neck supple.  Skin:    General: Skin is warm and dry.     Coloration: Skin is not pale.     Findings: No erythema or rash.  Neurological:     General: No focal deficit present.     Mental Status: He is alert and oriented to person, place, and time.  Psychiatric:        Mood and Affect: Mood normal.        Behavior: Behavior normal.        Thought Content: Thought content normal.        Judgment: Judgment normal.   Foot   06/05/2020:        06/16/2020:       08/02/2020:     Foot Aug 29, 2020:       10/11/2020:     December 18, 2020:        Assessment & Plan:  Osteomyelitis of left foot with peripheral vascular disease status post revascularization and treatment with systemic IV antibiotics  I am ordering a repeat sed rate and CRP  We have again reviewed the signs and symptoms of infection that he is to watch out for.  Of Zyvox at home they can use if he has evidence of infection recurring so that he can start therapy promptly   Peripheral vascular disease status post revascularization  Diabetic neuropathy: Again this complicates our interpretation of pain at the site of his  osteomyelitis  Hypertension: BP is worse today. Not sure if some degree of white coat hypertension  Vitals:   12/18/20 1005  BP: (!) 181/99  Pulse: 78  Temp: 98.1 F (36.7 C)  SpO2: 96%   Chronic kidney disease: Last 3 metabolic panels reviewed and GFR is stable  BMP Latest Ref Rng & Units 10/11/2020 08/16/2020 06/16/2020  Glucose 65 - 99 mg/dL 183(H) 178(H) 208(H)  BUN 7 - 25 mg/dL 20 24(H) 30(H)  Creatinine 0.70 - 1.18 mg/dL 1.40(H) 1.37 1.44(H)  BUN/Creat Ratio 6 - 22 (calc) 14 - -  Sodium 135 - 146 mmol/L 138 138 136  Potassium 3.5 - 5.3 mmol/L 5.4(H) 5.2 No hemolysis seen(H) 3.9  Chloride 98 - 110 mmol/L 102 102 101  CO2 20 - 32 mmol/L '28 26 25  '$ Calcium 8.6 - 10.3 mg/dL 9.3 9.4 8.9     History of C. difficile colitis: Again this precludes the use  of clindamycin  Postnasal drip: Likely allergic sinusitis following with PCP

## 2020-12-18 NOTE — Patient Instructions (Addendum)
Your blood pressure is high today.  Please see your primary care provider soon, to have it rechecked I have sent a prescription to your pharmacy, to add "Ozempic," and: Please continue the same Novolog.  For the Ozempic, take just 0.25 mg the first week.   check your blood sugar twice a day.  vary the time of day when you check, between before the 3 meals, and at bedtime.  also check if you have symptoms of your blood sugar being too high or too low.  please keep a record of the readings and bring it to your next appointment here (or you can bring the meter itself).  You can write it on any piece of paper.  please call us sooner if your blood sugar goes below 70, or if you have a lot of readings over 200.   Please have another video visit in 2 months.

## 2020-12-19 LAB — BASIC METABOLIC PANEL WITH GFR
BUN/Creatinine Ratio: 19 (calc) (ref 6–22)
BUN: 25 mg/dL (ref 7–25)
CO2: 27 mmol/L (ref 20–32)
Calcium: 9.4 mg/dL (ref 8.6–10.3)
Chloride: 104 mmol/L (ref 98–110)
Creat: 1.34 mg/dL — ABNORMAL HIGH (ref 0.70–1.28)
Glucose, Bld: 177 mg/dL — ABNORMAL HIGH (ref 65–99)
Potassium: 5.2 mmol/L (ref 3.5–5.3)
Sodium: 140 mmol/L (ref 135–146)
eGFR: 55 mL/min/{1.73_m2} — ABNORMAL LOW (ref >=60)

## 2020-12-19 LAB — SEDIMENTATION RATE: Sed Rate: 19 mm/h (ref 0–20)

## 2020-12-19 LAB — C-REACTIVE PROTEIN: CRP: 2.8 mg/L (ref <8.0)

## 2020-12-26 ENCOUNTER — Ambulatory Visit
Admission: RE | Admit: 2020-12-26 | Discharge: 2020-12-26 | Disposition: A | Discharge status: Home / Self Care | Payer: PPO | Source: Ambulatory Visit | Attending: Interventional Radiology | Admitting: Interventional Radiology

## 2020-12-26 ENCOUNTER — Other Ambulatory Visit: Payer: Self-pay

## 2020-12-26 ENCOUNTER — Encounter: Payer: Self-pay | Admitting: *Deleted

## 2020-12-26 DIAGNOSIS — I998 Other disorder of circulatory system: Secondary | ICD-10-CM | POA: Diagnosis not present

## 2020-12-26 DIAGNOSIS — M86172 Other acute osteomyelitis, left ankle and foot: Secondary | ICD-10-CM | POA: Diagnosis not present

## 2020-12-26 DIAGNOSIS — L97529 Non-pressure chronic ulcer of other part of left foot with unspecified severity: Secondary | ICD-10-CM | POA: Diagnosis not present

## 2020-12-26 DIAGNOSIS — I70245 Atherosclerosis of native arteries of left leg with ulceration of other part of foot: Secondary | ICD-10-CM | POA: Diagnosis not present

## 2020-12-26 DIAGNOSIS — E08621 Diabetes mellitus due to underlying condition with foot ulcer: Secondary | ICD-10-CM

## 2020-12-26 HISTORY — PX: IR RADIOLOGIST EVAL & MGMT: IMG5224

## 2020-12-26 NOTE — Progress Notes (Signed)
Reason for follow up: The patient is seen in follow up today s/p left lower extremity angiogram, laser atherectomy of the popliteal and proximal anterior tibial arteries with balloon and drug coated angioplasty on 06/16/20.  History of present illness: [name] presents for 3 month follow up with ABIs.  He continues to recover very well, with continued healing of his left foot wound.  He is no longer on antibiotics.  He continues daily aspirin, 81 mg.  No complaints of claudication.  No new wounds or foot pain.  He was recently seen by Dr. Loanne Drilling with Endocrinology, and is now wearing a Freestyle blood glucose monitor and has adjusted his insulin regimen to hopefully improve his most recent HbA1c of 9.1      Past Medical History:  Diagnosis Date   A-fib ([place])    CKD (chronic kidney disease) stage 3, GFR 30-59 ml/min ([place]) 08/29/2020   Diabetes mellitus    Heart attack ([place]) 10/01/2018   Pt had open heart surgery   Hyperkalemia 08/29/2020   Hypertension    Osteomyelitis of fifth toe of left foot ([place]) 05/08/2020   Simple chronic bronchitis ([place]) 05/08/2020   Tobacco abuse     Past Surgical History:  Procedure Laterality Date   CORONARY ARTERY BYPASS GRAFT N/A 10/01/2018   Procedure: CORONARY ARTERY BYPASS GRAFTING (CABG) TIMES  FOUR USING LEFT MAMMARY ARTERY AND RIGHT GREATER SAPHEANOUS VEIN HARVESTED ENDOSCOPICALLY;  Surgeon: Melrose Nakayama, MD;  Location: [facility];  Service: Open Heart Surgery;  Laterality: N/A;   HERNIA REPAIR     >10 years ago   IR ANGIOGRAM EXTREMITY LEFT  06/16/2020   IR RADIOLOGIST EVAL & MGMT  12/24/2016   IR RADIOLOGIST EVAL & MGMT  05/30/2020   IR RADIOLOGIST EVAL & MGMT  07/13/2020   IR RADIOLOGIST EVAL & MGMT  09/06/2020   IR THORACENTESIS ASP PLEURAL SPACE W/IMG GUIDE  10/06/2018   IR US GUIDE VASC ACCESS RIGHT  06/16/2020   LEFT HEART CATH AND CORONARY ANGIOGRAPHY N/A 09/29/2018   Procedure: LEFT HEART CATH AND CORONARY ANGIOGRAPHY;  Surgeon: Troy Sine,  MD;  Location: [facility];  Service: Cardiovascular;  Laterality: N/A;   open heart surfery     right shoulder surgery     around 2014   TEE WITHOUT CARDIOVERSION N/A 10/01/2018   Procedure: TRANSESOPHAGEAL ECHOCARDIOGRAM (TEE);  Surgeon: Melrose Nakayama, MD;  Location: [facility];  Service: Open Heart Surgery;  Laterality: N/A;    Allergies: Simvastatin, Statins, and Xanax [alprazolam]  Medications: Prior to Admission medications   Medication Sig Start Date End Date Taking? Authorizing Provider  aspirin 81 MG EC tablet Take 1 tablet (81 mg total) by mouth daily. 02/04/19   Troy Sine, MD  azelastine (ASTELIN) 0.1 % nasal spray Place 1 spray into both nostrils 2 (two) times daily. 08/16/20   Marin Olp, MD  clopidogrel (PLAVIX) 75 MG tablet Take 1 tablet (75 mg total) by mouth daily. 06/05/20   Aalina Brege, Rosanne Ashing, MD  Continuous Blood Gluc Sensor (FREESTYLE LIBRE 2 SENSOR) MISC 1 Device by Does not apply route every 14 (fourteen) days. 12/18/20   Renato Shin, MD  ezetimibe (ZETIA) 10 MG tablet TAKE 1 TABLET (10 MG TOTAL) BY MOUTH DAILY. NEED APPT 12/01/20   Troy Sine, MD  fexofenadine (ALLEGRA) 180 MG tablet Take 180 mg by mouth daily as needed for allergies or rhinitis.    [provider]  fluticasone (FLONASE) 50 MCG/ACT nasal  spray Place 2 sprays into both nostrils daily as needed for allergies or rhinitis.    [provider]  insulin aspart (NOVOLOG FLEXPEN) 100 UNIT/ML FlexPen Inject 25 Units into the skin 3 (three) times daily with meals. 3 times a day (just before each meal) 25-20-25 units, and pen needles 4/day 12/18/20   Renato Shin, MD  Insulin Pen Needle (NOVOFINE) 32G X 6 MM MISC USE 5 TIMES DAILY 07/05/19   Renato Shin, MD  LIVALO 1 MG TABS TAKE 1 TABLET BY MOUTH EVERY DAY Patient taking differently: Take 1 mg by mouth daily. 05/15/20   Marin Olp, MD  losartan (COZAAR) 25 MG tablet Take 1 tablet (25 mg total) by mouth in the morning and  at bedtime. 08/16/20   Marin Olp, MD  metoprolol tartrate (LOPRESSOR) 50 MG tablet TAKE 1 TABLET BY MOUTH TWICE A DAY Patient taking differently: Take 50 mg by mouth 2 (two) times daily. 03/21/20   Troy Sine, MD  ONETOUCH VERIO test strip CHECK LEVELS 4 TIMES DAILY 10/20/20   Renato Shin, MD  Semaglutide,0.25 or 0.'5MG'$ /DOS, (OZEMPIC, 0.25 OR 0.5 MG/DOSE,) 2 MG/1.5ML SOPN Inject 0.5 mg into the skin once a week. 12/18/20   Renato Shin, MD  torsemide (DEMADEX) 20 MG tablet MUST MAKE APPOINTMENT FOR FUTURE REFILL LAST ATTEMPT. TAKE 2 TABLETS BY MOUTH EVERY DAY 12/01/20   Troy Sine, MD  vitamin C (ASCORBIC ACID) 250 MG tablet Take 250 mg by mouth daily.    [provider]  DAPTOmycin (CUBICIN) 500 MG injection Inject into the vein. Patient not taking: Reported on 10/22/2020 08/23/20   [provider]  DAPTOmycin 350 MG SOLR Inject into the vein. Patient not taking: No sig reported    [provider]     Family History  Problem Relation Age of Onset   Hypertension Mother    Heart disease Mother        CHF did not see doctor   Diabetes Maternal Grandmother    Diabetes Son     Social History   Socioeconomic History   Marital status: Married    Spouse name: Not on file   Number of children: Not on file   Years of education: Not on file   Highest education level: Not on file  Occupational History   Not on file  Tobacco Use   Smoking status: Former    Packs/day: 0.75    Years: 2.00    Pack years: 1.50    Types: Cigarettes    Start date: 2002    Quit date: 04/29/2002    Years since quitting: 18.6   Smokeless tobacco: Never   Tobacco comments:    smoked for 10 years on and off  Substance and Sexual Activity   Alcohol use: Yes    Alcohol/week: 0.0 standard drinks    Comment: 6 martinis a year   Drug use: No   Sexual activity: Yes  Other Topics Concern   Not on file  Social History Narrative   Married. 3 kids. 7 grandkids. No  greatgrandkids.       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 HENT:     Head: Normocephalic.  Cardiovascular:     Rate and Rhythm: Normal rate and regular rhythm.     Comments: Faintly palpable left AT, non-palpable PT Pulmonary:     Breath sounds: Normal breath sounds.  Abdominal:     General: There is no distension.  Musculoskeletal:     Right lower leg: Edema present.     Left lower leg: Edema present.  Skin:    General: Skin is warm and dry.  Neurological:     Mental Status: He is alert and oriented to person, place, and time.   12/26/20   Imaging: ABI 12/26/20 R ABI 1.14 (previously 1.17) R TBI 0.71 (previously 0.69) L ABI 1.05 (previously 0.85) L TBI 0.48 (previously 0.44)  Labs:  CBC: CRP 4.1 (6/15) --> 2.8 (8/22) Sed Rate 22 (6/15) --> 19 (8/22) HbA1c 9.1 (8/22)   Assessment and Plan: 75 year old male with history of [place] 5 peripheral artery disease 6 months status post left popliteal and anterior tibial artery revascularization (laser atherectomy, balloon angioplasty, drug coated balloon angioplasty on 06/16/20) now with closed wound.  No clinical evidence of persistent osteomyelitis.  Overall recovering very well.   -If there is clinical suspicion for recurrent osteomyelitis, recommend repeat MRI.  I would also recommend repeat angiogram at this time with attempted recanalization of the distal anterior tibial artery which is occluded with supply the dorsum of the foot via multiple calf collaterals, and  inline flow to the foot via the posterior tibial artery. -Continue ASA 81 mg QD. -Follow up in 6 months with repeat ABIs, or sooner if issues arise.  Electronically Signed: Rosanne Ashing Williams Dietrick 12/26/2020, 9:14 AM   I spent a total of 25 Minutes in face to face in clinical consultation, greater than 50% of which was counseling/coordinating care for peripheral artery disease.

## 2021-01-15 ENCOUNTER — Telehealth: Payer: Self-pay | Admitting: Family Medicine

## 2021-01-15 NOTE — Progress Notes (Signed)
  Care Management   Follow Up Note   01/15/2021 Name: Randie Ewin [facility] Sr. MRN: [id number] DOB: [birth year]   Referred by: Marin Olp, MD Reason for referral : No chief complaint on file.   An unsuccessful telephone outreach was attempted today. The patient was referred to the case management team for assistance with care management and care coordination.   Follow Up Plan: The care management team will reach out to the patient again over the next 7 days.   [place]

## 2021-01-22 ENCOUNTER — Telehealth: Payer: Self-pay | Admitting: Cardiovascular Disease

## 2021-01-22 ENCOUNTER — Other Ambulatory Visit: Payer: Self-pay

## 2021-01-22 MED ORDER — EZETIMIBE 10 MG PO TABS: 10.0000 mg | ORAL_TABLET | Freq: Every day | ORAL | 3 refills | Status: DC

## 2021-01-22 MED ORDER — METOPROLOL TARTRATE 50 MG PO TABS: 50.0000 mg | ORAL_TABLET | Freq: Two times a day (BID) | ORAL | 1 refills | Status: DC

## 2021-01-22 NOTE — Telephone Encounter (Signed)
 *  STAT* If patient is at the pharmacy, call can be transferred to refill team.   1. Which medications need to be refilled? (please list name of each medication and dose if known) metoprolol tartrate (LOPRESSOR) 50 MG tablet  ezetimibe (ZETIA) 10 MG tablet  2. Which pharmacy/location (including street and city if local pharmacy) is medication to be sent to? [id number] - [place], [place] - [address] AT [place]  3. Do they need a 30 day or 90 day supply? 90 days  Pt is out of meds, needs refill today. He made an appt with Dr. Claiborne Billings on 11/14

## 2021-01-22 NOTE — Telephone Encounter (Signed)
Patient called back and stated he is not interested in this service and would not like a call back.

## 2021-01-23 ENCOUNTER — Telehealth: Payer: Self-pay | Admitting: Family Medicine

## 2021-01-23 NOTE — Progress Notes (Signed)
  Care Management   Follow Up Note   01/23/2021 Name: Martin [name] [facility] Sr. MRN: [id number] DOB: 08-26-1945   Referred by: Marin Olp, MD Reason for referral : No chief complaint on file.   Third unsuccessful telephone outreach was attempted today. The patient was referred to the case management team for assistance with care management and care coordination. The patient's primary care provider has been notified of our unsuccessful attempts to make or maintain contact with the patient. The care management team is pleased to engage with this patient at any time in the future should he/she be interested in assistance from the care management team.   Follow Up Plan: No further follow up required:    [place]

## 2021-01-23 NOTE — Patient Instructions (Addendum)
Health Maintenance Due  Topic Date Due   Zoster Vaccines- Shingrix (1 of 2)   -Please check with your pharmacy to see if they have the shingrix vaccine. If they do- please get this immunization and update [place] by phone call or mychart with dates you receive the vaccine.   Never done   INFLUENZA VACCINE   -Completed today in office.  11/27/2020   When you notice that the swelling in your legs is getting worse, I recommend taking Demadex/torsemide as needed. Perhaps try a dose of this with some swelling in legs today- and definitely if worsens  We will trial reflux medicine pantoprazole for a month to see if this will help you with your congestion that you have been experiencing. If this does not improve or symptoms worsen, we will consider an ENT referral.  Take TWO losartan 25 mg tablets twice daily until you run out and then you may pick up your new prescription 50 mg to take twice daily. Please update me within 3 weeks wit your home blood pressure readings through MyChart.  Recommended follow up: Return in about 2 months (around 03/27/2021) for a follow-up or sooner if needed to re-check on blood pressure.

## 2021-01-23 NOTE — Progress Notes (Signed)
Phone [phone] In person visit   Subjective:   Martin BURNHAM Sr. is a 75 y.o. year old very pleasant male patient who presents for/with See problem oriented charting Chief Complaint  Patient presents with   Diabetes   Hypertension    This visit occurred during the SARS-CoV-2 public health emergency.  Safety protocols were in place, including screening questions prior to the visit, additional usage of staff PPE, and extensive cleaning of exam room while observing appropriate contact time as indicated for disinfecting solutions.   Past Medical History-  Patient Active Problem List   Diagnosis Date Noted   Postoperative atrial fibrillation ([place]) 10/24/2018    Priority: 1.   Coronary artery disease s/p CABG 10/01/2018 after STEMI     Priority: 1.   Undiagnosed cardiac murmurs 08/15/2015    Priority: 1.   DM (diabetes mellitus) type II uncontrolled with eye manifestation ([place]) 02/03/2007    Priority: 1.   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   PAD (peripheral artery disease) ([place]) 08/20/2017    Priority: 2.   Partial tear of right Achilles tendon 02/12/2017    Priority: 2.   Aortic atherosclerosis ([place]) 10/07/2016    Priority: 2.   History of skin cancer 08/15/2015    Priority: 2.   Diabetic retinopathy ([place]) 02/21/2015    Priority: 2.   Hyperlipidemia associated with type 2 diabetes mellitus ([place]) 08/24/2014    Priority: 2.   Diabetic polyneuropathy ([place]) 04/11/2009    Priority: 2.   Hypertension associated with diabetes ([place]) 02/03/2007    Priority: 2.   Acute blood loss as cause of postoperative anemia 10/24/2018    Priority: 3.   BPH (benign prostatic hyperplasia) 10/24/2018    Priority: 3.   History of depression 10/24/2018    Priority: 3.   S/P CABG x 4 10/01/2018    Priority: 3.   Onychomycosis of toenail 02/17/2018    Priority: 3.   Allergic rhinitis 08/20/2017    Priority: 3.   C. difficile colitis 09/25/2016    Priority: 3.    History of aspiration pneumonia 09/25/2016    Priority: 3.   Former smoker 08/24/2014    Priority: 3.   Ingrowing toenail 07/19/2014    Priority: 3.   BACK PAIN, LUMBAR, WITH RADICULOPATHY 08/30/2008    Priority: 3.   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     azelastine (ASTELIN) 0.1 % nasal spray Place 1 spray into both nostrils 2 (two) times daily. 30 mL 12   Continuous Blood Gluc Sensor (FREESTYLE LIBRE 2 SENSOR) MISC 1 Device by Does not apply route every 14 (fourteen) days. 6 each 3   fluticasone (FLONASE) 50 MCG/ACT nasal spray Place 2 sprays into both nostrils daily as needed for allergies or rhinitis.     insulin aspart (NOVOLOG FLEXPEN) 100 UNIT/ML FlexPen Inject 25 Units into the skin 3 (three) times daily with meals. 3 times a day (just before each meal) 25-20-25 units, and pen needles 4/day 75 mL 3   Insulin Pen Needle (NOVOFINE) 32G X 6 MM MISC USE 5 TIMES DAILY 450 each 2   loratadine (CLARITIN)  10 MG tablet Take 10 mg by mouth daily.     metoprolol tartrate (LOPRESSOR) 50 MG tablet Take 1 tablet (50 mg total) by mouth 2 (two) times daily. 180 tablet 1   ONETOUCH VERIO test strip CHECK LEVELS 4 TIMES DAILY 100 strip 2   pantoprazole (PROTONIX) 20 MG tablet Take 1 tablet (20 mg total) by mouth daily. 90 tablet 1   torsemide (DEMADEX) 20 MG tablet MUST MAKE APPOINTMENT FOR FUTURE REFILL LAST ATTEMPT. TAKE 2 TABLETS BY MOUTH EVERY DAY 30 tablet 0   vitamin C (ASCORBIC ACID) 250 MG tablet Take 250 mg by mouth daily.     clopidogrel (PLAVIX) 75 MG tablet Take 1 tablet (75 mg total) by mouth daily. 90 tablet 3   Continuous Blood Gluc Receiver  (FREESTYLE LIBRE 2 READER) DEVI See admin instructions.     Continuous Blood Gluc Receiver (FREESTYLE LIBRE 2 READER) DEVI      ezetimibe (ZETIA) 10 MG tablet Take 1 tablet (10 mg total) by mouth daily. 90 tablet 3   fexofenadine (ALLEGRA) 180 MG tablet Take 180 mg by mouth daily as needed for allergies or rhinitis. (Patient not taking: Reported on 01/25/2021)     losartan (COZAAR) 50 MG tablet Take 1 tablet (50 mg total) by mouth in the morning and at bedtime. 180 tablet 3   Pitavastatin Calcium (LIVALO) 1 MG TABS Take 1 tablet (1 mg total) by mouth daily. 30 tablet 11   Semaglutide,0.25 or 0.5MG /DOS, (OZEMPIC, 0.25 OR 0.5 MG/DOSE,) 2 MG/1.5ML SOPN Inject 0.5 mg into the skin once a week. (Patient not taking: Reported on 01/25/2021) 4.5 mL 3   No current facility-administered medications for this visit.     Objective:  BP (!) 164/78   Pulse 70   Temp (!) 97.3 F (36.3 C) (Temporal)   Ht 6' (1.829 m)   Wt 272 lb 3.2 oz (123.5 kg)   SpO2 97%   BMI 36.92 kg/m  Gen: NAD, resting comfortably CV: RRR no murmurs rubs or gallops Lungs: very faint crackles base of lungs Ext: 1+ edema- suggested a dose of torsemide or even half tablet (some low BP in past) Skin: warm, dry     Assessment and Plan  #Chronic cough/congestion since November 2021 -Normal chest x-ray 06/30/2020 -Improvement with treatment for sinusitis in the past with Augmentin  previously had taking Claritin but felt lightheaded in the day (doing ok intermittently) and stopped, Mucinex, Flonase -Last visit we opted to try Astelin-patient reports mild improvement.  Has been using flonase at times- only uses one per day- discussed can use together.  -Have considered adding reflux medication- he agrees to this today omeprazole 20 mg -Originally held off on prednisone due to patient's  osteomyelitis issues. with PICC line  - has seen ENT Dr. Lucia Gaskins and rx was helpful with antibiotic- most recently that was not helpful though through  e visit -would consider ENT again if not improving by follow up   #Osteomyelitis of fifth toe left foot- on chronic daptomycin previously (now off-plans to restart alternate abx if evidence of infection recurrence) through infectious disease-close IR follow up   Patient had revascularization with IR in left lower leg with improved blood flow also to help improve healing. Sees IR every 2 months now.  - thankfully wound finally healed about 3 weeks after this - was seen 12/18/2020 by Dr. Tommy Medal and doing well overall- 2 month repeat planned  # Diabetes-managed by endocrinology Dr. Loanne Drilling. Recent ozempic addition -History  of diabetic retinopathy  -History of diabetic polyneuropathy which contributed to diabetic  -hoping will improve by follow up -had considered getting second opinion fro Dr. Buddy Duty- but he is not accepting new patients.  -trying freestyle libre  #CAD-no chest pain or shortness of breath- sparing palpitations  #Peripheral arterial disease- history of angioplasty in February 2022.  #Statin myalgias  #hyperlipidemia-with LDL goal under 70 particular with aortic atherosclerosis  S: Medication:Aspirin 81 mg daily , Plavix 75 mg daily (interventional radiology has recommended continuing aspirin and Plavix), Zetia 10 mg daily, Livalo 1 mg daily Lab Results  Component Value Date   CHOL 155 08/16/2020   HDL 40.70 08/16/2020   LDLCALC 94 08/16/2020   LDLDIRECT 173.0 07/01/2018   TRIG 101.0 08/16/2020   CHOLHDL 4 08/16/2020  A/P: LDL has been high but this is best tolerable combo we have had- still at least under 100 - but prefer under 70 for PAD, CAD- thankfully each of these are stable- continue current meds. He is going to clarify at next visit with Dr. Serafina Royals if plavix needs to be continued but we will continue for now  #hypertension S: medication: Losartan 25 mg twice daily, metoprolol 50 mg twice daily, torsemide 40 mg daily Home readings #s: 150s or 160s/70s in AM then by 2  or 3 in evening it is 133/72 BP Readings from Last 3 Encounters:  01/25/21 (!) 164/78  12/26/20 (!) 175/87  12/18/20 (!) 181/99  A/P: poor control- increase losartan to 50 mg BID and update me in 3 weeks.   Recommended follow up: 2 month follow up  Future Appointments  Date Time Provider Between  02/14/2021  9:30 AM Tommy Medal, Lavell Islam, MD [facility]  02/20/2021  8:15 AM Renato Shin, MD [facility] None  03/12/2021 10:20 AM Troy Sine, MD [facility]    Lab/Order associations:   ICD-10-CM   1. Hypertension associated with diabetes ([place])  E11.59    I15.2     2. Iliac artery aneurysm (HCC)  I72.3     3. PAD (peripheral artery disease) (HCC)  I73.9     4. Coronary artery disease involving native coronary artery of native heart without angina pectoris  I25.10     5. 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    Dispense:  180 tablet    Refill:  3   ezetimibe (ZETIA) 10 MG tablet    Sig: Take 1 tablet (10 mg total) by mouth daily.    Dispense:  90 tablet    Refill:  3    PATIENT MUST SCHEDULE APPOINTMENT FOR FUTURE REFILLS. LAST ATTEMPT   Pitavastatin Calcium (LIVALO) 1 MG TABS    Sig: Take 1 tablet (1 mg total) by mouth daily.    Dispense:  30 tablet    Refill:  11   clopidogrel (PLAVIX) 75 MG tablet    Sig: Take 1 tablet (75 mg total) by mouth daily.    Dispense:  90 tablet    Refill:  3   pantoprazole (PROTONIX) 20 MG tablet    Sig: Take 1 tablet (20 mg total) by mouth daily.    Dispense:  90 tablet    Refill:  1    I,Harris Phan,acting as a Education administrator for [employer]  Yong Channel, MD.,have documented all relevant documentation on the behalf of [name], MD,as directed by  [name], MD while in the presence of [name], MD.  I, [name], MD, have reviewed all  documentation for this visit. The documentation on 01/25/21 for the exam, diagnosis, procedures, and orders are all accurate and complete.  Return precautions advised.  [name], MD

## 2021-01-25 ENCOUNTER — Encounter: Payer: Self-pay | Admitting: Family Medicine

## 2021-01-25 ENCOUNTER — Other Ambulatory Visit: Payer: Self-pay

## 2021-01-25 ENCOUNTER — Ambulatory Visit (INDEPENDENT_AMBULATORY_CARE_PROVIDER_SITE_OTHER): Payer: PPO | Admitting: Family Medicine

## 2021-01-25 VITALS — BP 128/68 | HR 70 | Temp 97.3°F | Ht 72.0 in | Wt 272.2 lb

## 2021-01-25 DIAGNOSIS — I739 Peripheral vascular disease, unspecified: Secondary | ICD-10-CM | POA: Diagnosis not present

## 2021-01-25 DIAGNOSIS — E1159 Type 2 diabetes mellitus with other circulatory complications: Secondary | ICD-10-CM

## 2021-01-25 DIAGNOSIS — M869 Osteomyelitis, unspecified: Secondary | ICD-10-CM | POA: Diagnosis not present

## 2021-01-25 DIAGNOSIS — Z23 Encounter for immunization: Secondary | ICD-10-CM

## 2021-01-25 DIAGNOSIS — I2510 Atherosclerotic heart disease of native coronary artery without angina pectoris: Secondary | ICD-10-CM | POA: Diagnosis not present

## 2021-01-25 DIAGNOSIS — I152 Hypertension secondary to endocrine disorders: Secondary | ICD-10-CM | POA: Diagnosis not present

## 2021-01-25 DIAGNOSIS — I723 Aneurysm of iliac artery: Secondary | ICD-10-CM

## 2021-01-25 DIAGNOSIS — E1139 Type 2 diabetes mellitus with other diabetic ophthalmic complication: Secondary | ICD-10-CM

## 2021-01-25 MED ORDER — EZETIMIBE 10 MG PO TABS: 10.0000 mg | ORAL_TABLET | Freq: Every day | ORAL | 3 refills | Status: DC

## 2021-01-25 MED ORDER — LOSARTAN POTASSIUM 50 MG PO TABS: 50.0000 mg | ORAL_TABLET | Freq: Two times a day (BID) | ORAL | 3 refills | Status: DC

## 2021-01-25 MED ORDER — LIVALO 1 MG PO TABS: 1.0000 | ORAL_TABLET | Freq: Every day | ORAL | 11 refills | Status: DC

## 2021-01-25 MED ORDER — CLOPIDOGREL BISULFATE 75 MG PO TABS: 75.0000 mg | ORAL_TABLET | Freq: Every day | ORAL | 3 refills | Status: DC

## 2021-01-25 MED ORDER — PANTOPRAZOLE SODIUM 20 MG PO TBEC: 20.0000 mg | DELAYED_RELEASE_TABLET | Freq: Every day | ORAL | 1 refills | Status: DC

## 2021-02-14 ENCOUNTER — Ambulatory Visit: Payer: PPO | Admitting: Infectious Disease

## 2021-02-20 ENCOUNTER — Encounter: Payer: Self-pay | Admitting: Endocrinology

## 2021-02-20 ENCOUNTER — Ambulatory Visit (INDEPENDENT_AMBULATORY_CARE_PROVIDER_SITE_OTHER): Payer: PPO | Admitting: Endocrinology

## 2021-02-20 ENCOUNTER — Other Ambulatory Visit: Payer: Self-pay

## 2021-02-20 VITALS — BP 170/82 | HR 85 | Ht 72.0 in | Wt 271.8 lb

## 2021-02-20 DIAGNOSIS — E1151 Type 2 diabetes mellitus with diabetic peripheral angiopathy without gangrene: Secondary | ICD-10-CM | POA: Diagnosis not present

## 2021-02-20 DIAGNOSIS — E0869 Diabetes mellitus due to underlying condition with other specified complication: Secondary | ICD-10-CM

## 2021-02-20 DIAGNOSIS — Z794 Long term (current) use of insulin: Secondary | ICD-10-CM

## 2021-02-20 NOTE — Patient Instructions (Addendum)
Your blood pressure is high today.  Please see your primary care provider soon, to have it rechecked.   I have sent a prescription to your pharmacy, to add "Ozempic," and:  Please continue the same Novolog.  For the Ozempic, take just 0.25 mg the first week.   check your blood sugar twice a day.  vary the time of day when you check, between before the 3 meals, and at bedtime.  also check if you have symptoms of your blood sugar being too high or too low.  please keep a record of the readings and bring it to your next appointment here (or you can bring the meter itself).  You can write it on any piece of paper.  please call us sooner if your blood sugar goes below 70, or if you have a lot of readings over 200.   Please have another video visit in 3 months.

## 2021-02-20 NOTE — Progress Notes (Signed)
Subjective:    Patient ID: Martin Agee Sr., male    DOB: May 19, 1945, 75 y.o.   MRN: [id number]  HPI Pt returns for f/u of diabetes mellitus:  DM type: Insulin-requiring type 2.  Dx'ed: 4854 Complications: PN, foot ulcer, CRI, PAD, CAD, and DR.   Therapy: insulin since 2013.  DKA: never.   Severe hypoglycemia: never.  Pancreatitis: never.   Other: he takes multiple daily injections; he declines weight loss surgery; he eats 2-3 meals per day.   Interval history: pt states he feels well in general.  He says he never misses the insulin.  He takes Novolog 25 units 3 times a day (just before each meal).   He has not yet started Ozempic, due to fear of worsening DR.  I reviewed continuous glucose monitor data.  Glucose varies from 90-260.  It is in general highest and 4PM, and lowest at 1AM, but there is little trend throughout the day.   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 ([place]) 05/08/2020   Simple chronic bronchitis ([place]) 05/08/2020   Tobacco abuse     Past Surgical History:  Procedure Laterality Date   CORONARY ARTERY BYPASS GRAFT N/A 10/01/2018   Procedure: CORONARY ARTERY BYPASS GRAFTING (CABG) TIMES  FOUR USING LEFT MAMMARY ARTERY AND RIGHT GREATER SAPHEANOUS VEIN HARVESTED ENDOSCOPICALLY;  Surgeon: Melrose Nakayama, MD;  Location: [facility];  Service: Open Heart Surgery;  Laterality: N/A;   HERNIA REPAIR     >10 years ago   IR ANGIOGRAM EXTREMITY LEFT  06/16/2020   IR RADIOLOGIST EVAL & MGMT  12/24/2016   IR RADIOLOGIST EVAL & MGMT  05/30/2020   IR RADIOLOGIST EVAL & MGMT  07/13/2020   IR RADIOLOGIST EVAL & MGMT  09/06/2020   IR RADIOLOGIST EVAL & MGMT  12/26/2020   IR THORACENTESIS ASP PLEURAL SPACE W/IMG GUIDE  10/06/2018   IR US  GUIDE VASC ACCESS RIGHT  06/16/2020   LEFT HEART CATH AND CORONARY ANGIOGRAPHY N/A 09/29/2018   Procedure: LEFT HEART CATH AND CORONARY ANGIOGRAPHY;  Surgeon: Troy Sine, MD;  Location: [facility];  Service: Cardiovascular;  Laterality: N/A;   open heart surfery     right shoulder surgery     around 2014   TEE WITHOUT CARDIOVERSION N/A 10/01/2018   Procedure: TRANSESOPHAGEAL ECHOCARDIOGRAM (TEE);  Surgeon: Melrose Nakayama, MD;  Location: [facility];  Service: Open Heart Surgery;  Laterality: N/A;    Social History   Socioeconomic History   Marital status: Married    Spouse name: Not on file   Number of children: Not on file   Years of education: Not on file   Highest education level: Not on file  Occupational History   Not on file  Tobacco Use   Smoking status: Former    Packs/day: 0.75    Years: 2.00    Pack years: 1.50    Types: Cigarettes    Start date: 2002    Quit date: 04/29/2002    Years since quitting: 18.8   Smokeless tobacco: Never   Tobacco comments:    smoked for 10 years on and off  Substance and Sexual Activity   Alcohol use:  Yes    Alcohol/week: 0.0 standard drinks    Comment: 6 martinis a year   Drug use: No   Sexual activity: Yes  Other Topics Concern   Not on file  Social History Narrative   Married. 3 kids. 7 grandkids. No greatgrandkids.       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     azelastine (ASTELIN) 0.1 % nasal spray Place 1 spray into  both nostrils 2 (two) times daily. 30 mL 12   clopidogrel (PLAVIX) 75 MG tablet Take 1 tablet (75 mg total) by mouth daily. 90 tablet 3   Continuous Blood Gluc Receiver (FREESTYLE LIBRE 2 READER) DEVI See admin instructions.     Continuous Blood Gluc Sensor (FREESTYLE LIBRE 2 SENSOR) MISC 1 Device by Does not apply route every 14 (fourteen) days. 6 each 3   ezetimibe (ZETIA) 10 MG tablet Take 1 tablet (10 mg total) by mouth daily. 90 tablet 3   fexofenadine (ALLEGRA) 180 MG tablet Take 180 mg by mouth daily as needed for allergies or rhinitis.     fluticasone (FLONASE) 50 MCG/ACT nasal spray Place 2 sprays into both nostrils daily as needed for allergies or rhinitis.     insulin aspart (NOVOLOG FLEXPEN) 100 UNIT/ML FlexPen Inject 25 Units into the skin 3 (three) times daily with meals. 3 times a day (just before each meal) 25-20-25 units, and pen needles 4/day 75 mL 3   Insulin Pen Needle (NOVOFINE) 32G X 6 MM MISC USE 5 TIMES DAILY 450 each 2   loratadine (CLARITIN) 10 MG tablet Take 10 mg by mouth daily.     losartan (COZAAR) 50 MG tablet Take 1 tablet (50 mg total) by mouth in the morning and at bedtime. 180 tablet 3   metoprolol tartrate (LOPRESSOR) 50 MG tablet Take 1 tablet (50 mg total) by mouth 2 (two) times daily. 180 tablet 1   ONETOUCH VERIO test strip CHECK LEVELS 4 TIMES DAILY 100 strip 2   pantoprazole (PROTONIX) 20 MG tablet Take 1 tablet (20 mg total) by mouth daily. 90 tablet 1   Pitavastatin Calcium (LIVALO) 1 MG TABS Take 1 tablet (1 mg total) by mouth daily. 30 tablet 11   Semaglutide,0.25 or 0.5MG /DOS, (OZEMPIC, 0.25 OR 0.5 MG/DOSE,) 2 MG/1.5ML SOPN Inject 0.5 mg into the skin once a week. 4.5 mL 3   torsemide (DEMADEX) 20 MG tablet MUST MAKE APPOINTMENT FOR FUTURE REFILL LAST ATTEMPT. TAKE 2 TABLETS BY MOUTH EVERY DAY 30 tablet 0   vitamin C (ASCORBIC ACID) 250 MG tablet Take 250 mg by mouth daily.     [DISCONTINUED] DAPTOmycin (CUBICIN) 500 MG injection Inject into the vein.  (Patient not taking: Reported on 10/22/2020)     [DISCONTINUED] DAPTOmycin 350 MG SOLR Inject into the vein. (Patient not taking: No sig reported)     No current facility-administered medications on file prior to visit.    Allergies  Allergen Reactions  Simvastatin Diarrhea   Statins Other (See Comments)    achey joints   Xanax [Alprazolam] Anxiety    Family History  Problem Relation Age of Onset   Hypertension Mother    Heart disease Mother        CHF did not see doctor   Diabetes Maternal Grandmother    Diabetes Son     BP (!) 170/82 (BP Location: Right Arm, Patient Position: Sitting, Cuff Size: Large)   Pulse 85   Ht 6' (1.829 m)   Wt 271 lb 12.8 oz (123.3 kg)   SpO2 93%   BMI 36.86 kg/m    Review of Systems     Objective:   Physical Exam Pulses: dorsalis pedis absent bilat.  MSK: no deformity of the feet, except left little toe overlaps the 4th.   CV: 2+ bilat leg edema.  Skin: healed ulcer at the left great toe.  normal temp on the feet. Mild erythema of the legs and left foot.  Bandaid at the left heel (over healing blister).  Neuro: sensation is intact to touch on the feet, but severely decreased from normal.  Ext: There is severe bilateral onychomycosis of the toenails.    A1c=7.9%  Lab Results  Component Value Date   CREATININE 1.34 (H) 12/18/2020   BUN 25 12/18/2020   NA 140 12/18/2020   K 5.2 12/18/2020   CL 104 12/18/2020   CO2 27 12/18/2020      Assessment & Plan:  Insulin-requiring type 2 DM: uncontrolled.  We discussed Ozempic.  He agrees to take.   Patient Instructions  Your blood pressure is high today.  Please see your primary care provider soon, to have it rechecked.   I have sent a prescription to your pharmacy, to add "Ozempic," and:  Please continue the same Novolog.  For the Ozempic, take just 0.25 mg the first week.   check your blood sugar twice a day.  vary the time of day when you check, between before the 3 meals, and at  bedtime.  also check if you have symptoms of your blood sugar being too high or too low.  please keep a record of the readings and bring it to your next appointment here (or you can bring the meter itself).  You can write it on any piece of paper.  please call us sooner if your blood sugar goes below 70, or if you have a lot of readings over 200.   Please have another video visit in 3 months.

## 2021-03-08 ENCOUNTER — Ambulatory Visit: Payer: PPO | Admitting: Infectious Disease

## 2021-03-12 ENCOUNTER — Ambulatory Visit (INDEPENDENT_AMBULATORY_CARE_PROVIDER_SITE_OTHER): Payer: PPO | Admitting: Cardiovascular Disease

## 2021-03-12 ENCOUNTER — Encounter: Payer: Self-pay | Admitting: Cardiovascular Disease

## 2021-03-12 ENCOUNTER — Other Ambulatory Visit: Payer: Self-pay

## 2021-03-12 DIAGNOSIS — E11621 Type 2 diabetes mellitus with foot ulcer: Secondary | ICD-10-CM

## 2021-03-12 DIAGNOSIS — I10 Essential (primary) hypertension: Secondary | ICD-10-CM

## 2021-03-12 DIAGNOSIS — E785 Hyperlipidemia, unspecified: Secondary | ICD-10-CM | POA: Diagnosis not present

## 2021-03-12 DIAGNOSIS — Z794 Long term (current) use of insulin: Secondary | ICD-10-CM | POA: Diagnosis not present

## 2021-03-12 DIAGNOSIS — E1142 Type 2 diabetes mellitus with diabetic polyneuropathy: Secondary | ICD-10-CM

## 2021-03-12 DIAGNOSIS — E118 Type 2 diabetes mellitus with unspecified complications: Secondary | ICD-10-CM | POA: Diagnosis not present

## 2021-03-12 DIAGNOSIS — Z951 Presence of aortocoronary bypass graft: Secondary | ICD-10-CM

## 2021-03-12 DIAGNOSIS — I152 Hypertension secondary to endocrine disorders: Secondary | ICD-10-CM

## 2021-03-12 DIAGNOSIS — I2510 Atherosclerotic heart disease of native coronary artery without angina pectoris: Secondary | ICD-10-CM | POA: Diagnosis not present

## 2021-03-12 DIAGNOSIS — R002 Palpitations: Secondary | ICD-10-CM | POA: Diagnosis not present

## 2021-03-12 DIAGNOSIS — I739 Peripheral vascular disease, unspecified: Secondary | ICD-10-CM | POA: Diagnosis not present

## 2021-03-12 DIAGNOSIS — L97509 Non-pressure chronic ulcer of other part of unspecified foot with unspecified severity: Secondary | ICD-10-CM

## 2021-03-12 DIAGNOSIS — M869 Osteomyelitis, unspecified: Secondary | ICD-10-CM

## 2021-03-12 DIAGNOSIS — E1169 Type 2 diabetes mellitus with other specified complication: Secondary | ICD-10-CM

## 2021-03-12 MED ORDER — EZETIMIBE 10 MG PO TABS: 10.0000 mg | ORAL_TABLET | Freq: Every day | ORAL | 3 refills | Status: DC

## 2021-03-12 MED ORDER — METOPROLOL SUCCINATE ER 50 MG PO TB24: 75.0000 mg | ORAL_TABLET | Freq: Every evening | ORAL | 5 refills | Status: DC

## 2021-03-12 MED ORDER — LIVALO 1 MG PO TABS: 1.0000 | ORAL_TABLET | Freq: Every day | ORAL | 3 refills | Status: DC

## 2021-03-12 NOTE — Patient Instructions (Addendum)
Medication Instructions:  Stop taking metoprolol tartrate. Start taking metoprolol succinate 75 mg (1 and 1/2 tablets) each night. *If you need a refill on your cardiac medications before your next appointment, please call your pharmacy*   Lab Work: Get a fasting blood work (lipid panel, CMET, TSH) before pharmacy appointment. Get blood work before your next appointment with Dr. Claiborne Billings (CMET and TSH) If you have labs (blood work) drawn today and your tests are completely normal, you will receive your results only by: [place] (if you have MyChart) OR A paper copy in the mail If you have any lab test that is abnormal or we need to change your treatment, we will call you to review the results.    Follow-Up: At [facility], you and your health needs are our priority.  As part of our continuing mission to provide you with exceptional heart care, we have created designated Provider Care Teams.  These Care Teams include your primary Cardiologist (physician) and Advanced Practice Providers (APPs -  Physician Assistants and Nurse Practitioners) who all work together to provide you with the care you need, when you need it.  We recommend signing up for the patient portal called "MyChart".  Sign up information is provided on this After Visit Summary.  MyChart is used to connect with patients for Virtual Visits (Telemedicine).  Patients are able to view lab/test results, encounter notes, upcoming appointments, etc.  Non-urgent messages can be sent to your provider as well.   To learn more about what you can do with MyChart, go to [email or web address].    Your next appointment:   3 month(s)  The format for your next appointment:   In Person  Provider:   Shelva Majestic, MD    Other Instructions Meet with pharmacist to discuss rapatha medication.

## 2021-03-12 NOTE — Progress Notes (Signed)
Cardiology Office Note    Date:  03/18/2021   ID:  Martin Agee Sr., DOB [birth year], MRN [id number]  PCP:  Marin Olp, MD  Cardiologist:  Shelva Majestic, MD   2 year F/u office visit   History of Present Illness:  Martin GULLEY Sr. is a 75 y.o. male who has a history of hypertension, paroxysmal arrhythmias, and type 2 diabetes mellitus with peripheral neuropathy who is followed by Dr. Renato Shin and Dr. Yong Channel.  On the morning of September 29, 2018 he developed new substernal chest pressure which persisted.  EMS was notified and ECG showed early inferior lateral ST elevation.  A inferior STEMI was activated.  I performed emergent cardiac catheterization demonstrated acute inferior STEMI secondary to an ulcerated plaque in the mid RCA with residual 90% stenosis with extensive thrombus burden but with TIMI-3 flow and probable distal embolization to the apical portion of the PDA vessel.  There is moderate stenosis of the PDA proximally.  He had significant concomitant CAD with 30% left main stenosis, 95 at near ostial LAD stenosis, 60% diagonal stenosis, 40 and 30% diffuse mid LAD stenoses, and 90% diffuse stenosis in the proximal portion of the distal marginal branch prior to bifurcation the circumflex vessel.  He ultimately underwent CABG x4 revascularization surgery by Dr. Roxan Hockey with a LIMA to the LAD, SVG to first diagonal, SVG to PDA and graft to the PLA (Y graft off PDA vein) with endoscopic vein harvest from the right leg.  An intraoperative echo showed mild LVH, left atrial dilatation, trace MR and EF of 55 to 65%.  His postoperative course was complicated by profound weakness, orthostatic hypotension, anemia, atrial fibrillation, acute kidney injury, severe deconditioning and depression.  He went to a skilled nursing facility on October 19, 2018 and ultimately went home in August 2020.  His amiodarone was discontinued when he saw Dr. Roxan Hockey in the office on September 1.  He last saw  Dr. Roxan Hockey on February 02, 2019.  The patient states that he has lost almost 50 pounds since his heart surgery.  He states he has a history of PAF for many years and typically would have 1-2 occurrences per year.  He admits to very poor sleep and typically wakes up every 1-2 hours.  His sleep is nonrestorative.  He admits to significant leg swelling following his surgery.     I saw him for an initial in office evaluation on February 04, 2019.  At that time his blood pressure was significantly elevated despite taking amlodipine 5 mg, furosemide 40 mg, and metoprolol 12.5 mg twice a day.  Since his renal function improved, I recommended initiation of low-dose losartan at 25 mg daily.  At that time, he was continuing to experience significant swelling despite taking furosemide and recommended discontinuance of furosemide and switch him to torsemide.  He presented with a 2.5 mg since this may have been contributory to his lower extremity edema.  I recommended he discontinue full dose aspirin and substitute this with 81 mg aspirin.  Since he was only taking 1 mg of Livalo twice per week I suggested he try increasing this to 1 mg every other day for 2 weeks and then increase to daily as tolerated.  I recommended support stockings.  I was also concerned about sleep apnea due to his longstanding history of poor sleep with frequent awakenings, daytime sleepiness, and loud snoring.    He was evaluated in a telemedicine visit on March 16, 2019.  Martin [name] continues to be followed by Dr. Loanne Drilling for his diabetes mellitus and blood sugars have improved.  He has extreme anxiety about Covid concerns and called the office requesting a telemedicine visit rather than an office evaluation.  He does not want to pursue a sleep evaluation until after the Covid pandemic has resolved.  He denies any anginal symptoms.  His lower extremity has improved off amlodipine.     Since my last evaluation, he had a nonhealing ulcerof his  left foot with osteomyelitis and had undergone hyperbaric chamber treatment without much benefit.  Ultimately he underwent peripheral vascular intervention of the left lower extremity with laser atherectomy of the popliteal and proximal anterior tibial arteries with balloon and drug-coated angioplasty on June 16, 2020 by Dr. Leonia Reeves an interventional radiologist.  He has felt improved with the improved blood flow to his lower extremity and this led to successful healing of his wound.  He has continued to see Dr. Renato Shin for his diabetes mellitus.  He is on aspirin but apparently is no longer on Plavix.  He has been on Zetia 10 mg and Livalo but apparently has been unable to tolerate Livalo.  He did not tolerate other statins.  He has experienced occasional palpitations.  He continues to be on losartan 50 mg in the morning and at bedtime and takes metoprolol tartrate 50 mg twice a day.  He is diabetic on Ozempic and insulin.  He takes torsemide for leg edema.  He presents for reevaluation.   Past Medical History:  Diagnosis Date   A-fib ([place])    CKD (chronic kidney disease) stage 3, GFR 30-59 ml/min (HCC) 08/29/2020   Diabetes mellitus    Heart attack ([place]) 10/01/2018   Pt had open heart surgery   Hyperkalemia 08/29/2020   Hypertension    Osteomyelitis of fifth toe of left foot ([place]) 05/08/2020   Simple chronic bronchitis ([place]) 05/08/2020   Tobacco abuse     Past Surgical History:  Procedure Laterality Date   CORONARY ARTERY BYPASS GRAFT N/A 10/01/2018   Procedure: CORONARY ARTERY BYPASS GRAFTING (CABG) TIMES  FOUR USING LEFT MAMMARY ARTERY AND RIGHT GREATER SAPHEANOUS VEIN HARVESTED ENDOSCOPICALLY;  Surgeon: Melrose Nakayama, MD;  Location: [facility];  Service: Open Heart Surgery;  Laterality: N/A;   HERNIA REPAIR     >10 years ago   IR ANGIOGRAM EXTREMITY LEFT  06/16/2020   IR RADIOLOGIST EVAL & MGMT  12/24/2016   IR RADIOLOGIST EVAL & MGMT  05/30/2020   IR RADIOLOGIST EVAL & MGMT   07/13/2020   IR RADIOLOGIST EVAL & MGMT  09/06/2020   IR RADIOLOGIST EVAL & MGMT  12/26/2020   IR THORACENTESIS ASP PLEURAL SPACE W/IMG GUIDE  10/06/2018   IR US GUIDE VASC ACCESS RIGHT  06/16/2020   LEFT HEART CATH AND CORONARY ANGIOGRAPHY N/A 09/29/2018   Procedure: LEFT HEART CATH AND CORONARY ANGIOGRAPHY;  Surgeon: Troy Sine, MD;  Location: [facility];  Service: Cardiovascular;  Laterality: N/A;   open heart surfery     right shoulder surgery     around 2014   TEE WITHOUT CARDIOVERSION N/A 10/01/2018   Procedure: TRANSESOPHAGEAL ECHOCARDIOGRAM (TEE);  Surgeon: Melrose Nakayama, MD;  Location: [facility];  Service: Open Heart Surgery;  Laterality: N/A;    Current Medications: Outpatient Medications Prior to Visit  Medication Sig Dispense Refill   aspirin 81 MG EC tablet Take 1 tablet (81 mg total) by mouth daily.  azelastine (ASTELIN) 0.1 % nasal spray Place 1 spray into both nostrils 2 (two) times daily. 30 mL 12   Continuous Blood Gluc Receiver (FREESTYLE LIBRE 2 READER) DEVI See admin instructions.     Continuous Blood Gluc Sensor (FREESTYLE LIBRE 2 SENSOR) MISC 1 Device by Does not apply route every 14 (fourteen) days. 6 each 3   fexofenadine (ALLEGRA) 180 MG tablet Take 180 mg by mouth daily as needed for allergies or rhinitis.     fluticasone (FLONASE) 50 MCG/ACT nasal spray Place 2 sprays into both nostrils daily as needed for allergies or rhinitis.     insulin aspart (NOVOLOG FLEXPEN) 100 UNIT/ML FlexPen Inject 25 Units into the skin 3 (three) times daily with meals. 3 times a day (just before each meal) 25-20-25 units, and pen needles 4/day 75 mL 3   Insulin Pen Needle (NOVOFINE) 32G X 6 MM MISC USE 5 TIMES DAILY 450 each 2   loratadine (CLARITIN) 10 MG tablet Take 10 mg by mouth as needed.     losartan (COZAAR) 50 MG tablet Take 1 tablet (50 mg total) by mouth in the morning and at bedtime. 180 tablet 3   ONETOUCH VERIO test strip CHECK LEVELS 4 TIMES DAILY 100 strip 2    torsemide (DEMADEX) 20 MG tablet MUST MAKE APPOINTMENT FOR FUTURE REFILL LAST ATTEMPT. TAKE 2 TABLETS BY MOUTH EVERY DAY 30 tablet 0   vitamin C (ASCORBIC ACID) 250 MG tablet Take 250 mg by mouth daily.     metoprolol tartrate (LOPRESSOR) 50 MG tablet Take 1 tablet (50 mg total) by mouth 2 (two) times daily. 180 tablet 1   clopidogrel (PLAVIX) 75 MG tablet Take 1 tablet (75 mg total) by mouth daily. (Patient not taking: Reported on 03/12/2021) 90 tablet 3   pantoprazole (PROTONIX) 20 MG tablet Take 1 tablet (20 mg total) by mouth daily. (Patient not taking: Reported on 03/12/2021) 90 tablet 1   Semaglutide,0.25 or 0.5MG /DOS, (OZEMPIC, 0.25 OR 0.5 MG/DOSE,) 2 MG/1.5ML SOPN Inject 0.5 mg into the skin once a week. (Patient not taking: Reported on 03/12/2021) 4.5 mL 3   ezetimibe (ZETIA) 10 MG tablet Take 1 tablet (10 mg total) by mouth daily. (Patient not taking: Reported on 03/12/2021) 90 tablet 3   Pitavastatin Calcium (LIVALO) 1 MG TABS Take 1 tablet (1 mg total) by mouth daily. (Patient not taking: Reported on 03/12/2021) 30 tablet 11   No facility-administered medications prior to visit.     Allergies:   Simvastatin, Statins, and Xanax [alprazolam]   Social History   Socioeconomic History   Marital status: Married    Spouse name: Not on file   Number of children: Not on file   Years of education: Not on file   Highest education level: Not on file  Occupational History   Not on file  Tobacco Use   Smoking status: Former    Packs/day: 0.75    Years: 2.00    Pack years: 1.50    Types: Cigarettes    Start date: 2002    Quit date: 04/29/2002    Years since quitting: 18.8   Smokeless tobacco: Never   Tobacco comments:    smoked for 10 years on and off  Substance and Sexual Activity   Alcohol use: Yes    Alcohol/week: 0.0 standard drinks    Comment: 6 martinis a year   Drug use: No   Sexual activity: Yes  Other Topics Concern   Not on file  Social History Narrative  Married. 3 kids. 7 grandkids. No greatgrandkids.       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   ROS General: Negative; No fevers, chills, or night sweats; history of obesity HEENT: Negative; No changes in vision or hearing, sinus congestion, difficulty swallowing Pulmonary: Negative; No cough, wheezing, shortness of breath, hemoptysis Cardiovascular: See HPI GI: Negative; No nausea, vomiting, diarrhea, or abdominal pain GU: Negative; No dysuria, hematuria, or difficulty voiding Musculoskeletal: Negative; no myalgias, joint pain, or weakness Hematologic/Oncology: Negative; no easy bruising, bleeding Endocrine: Diabetes mellitus with diabetic neuropathy Neuro: Negative; no changes in balance, headaches Skin: Previous nonhealing wound of his left foot Psychiatric: Negative; No behavioral problems, depression Sleep: Poor sleep with snoring, daytime sleepiness, hypersomnolence, no bruxism, restless legs, hypnogognic hallucinations, no cataplexy Other comprehensive 14 point system review is negative.   PHYSICAL EXAM:   VS:  BP 130/86   Pulse 71   Ht 6' (1.829 m)   Wt 275 lb 3.2 oz (124.8 kg)   SpO2 99%   BMI 37.32 kg/m     He states his blood pressure when he wakes up is 154/82 before medications which improved to 132/75 later in the day  Repeat blood pressure by me was 130/82.  Wt Readings from Last 3 Encounters:  03/12/21 275 lb 3.2 oz (124.8 kg)  02/20/21 271 lb 12.8 oz  (123.3 kg)  01/25/21 272 lb 3.2 oz (123.5 kg)     General: Alert, oriented, no distress.  Skin: normal turgor, no rashes, warm and dry HEENT: Normocephalic, atraumatic. Pupils equal round and reactive to light; sclera anicteric; extraocular muscles intact;  Nose without nasal septal hypertrophy Mouth/Parynx benign; Mallinpatti scale 3 Neck: No JVD, no carotid bruits; normal carotid upstroke Lungs: clear to ausculatation and percussion; no wheezing or rales Chest wall: without tenderness to palpitation Heart: PMI not displaced, RRR, s1 s2 normal, 1/6 systolic murmur, no diastolic murmur, no rubs, gallops, thrills, or heaves Abdomen: soft, nontender; no hepatosplenomehaly, BS+; abdominal aorta nontender and not dilated by palpation. Back: no CVA tenderness Pulses 2+ Musculoskeletal: full range of motion, normal strength, no joint deformities Extremities: Trace lower extremity edema, no clubbing cyanosis  Homan's sign negative  Neurologic: grossly nonfocal; Cranial nerves grossly wnl Psychologic: Normal mood and affect   Studies/Labs Reviewed:   March 12, 2021 ECG (independently read by me): NSR at 71, LAE, no ectopy  October 2020 ECG (independently read by me): Normal sinus rhythm at 90 bpm.  Possible left atrial enlargement nondiagnostic T changes..  Over 148 ms and QTc interval 467 ms  Recent Labs: BMP Latest Ref Rng & Units 12/18/2020 10/11/2020 08/16/2020  Glucose 65 - 99 mg/dL 538(Q) 551(K) 863(R)  BUN 7 - 25 mg/dL 25 20 31(F)  Creatinine 0.70 - 1.28 mg/dL 3.55(K) 6.51(K) 5.99  BUN/Creat Ratio 6 - 22 (calc) 19 14 -  Sodium 135 - 146 mmol/L 140 138 138  Potassium 3.5 - 5.3 mmol/L 5.2 5.4(H) 5.2 No hemolysis seen(H)  Chloride 98 - 110 mmol/L 104 102 102  CO2 20 - 32 mmol/L $RemoveB'27 28 26  'cSGfwffW$ Calcium 8.6 - 10.3 mg/dL 9.4 9.3 9.4     Hepatic Function Latest Ref Rng & Units 08/16/2020 02/09/2020 07/20/2019  Total Protein 6.0 - 8.3 g/dL 7.5 7.4 8.1  Albumin 3.5 - 5.2 g/dL 4.1 - 4.4   AST 0 - 37 U/L $Remo'14 23 19  'AGoVF$ ALT 0 - 53 U/L $Remo'9 22 19  'DICmw$ Alk Phosphatase 39 - 117 U/L 52 - 58  Total Bilirubin 0.2 - 1.2 mg/dL 1.2 1.9(H) 1.2  Bilirubin, Direct 0.0 - 0.3 mg/dL - - -    CBC Latest Ref Rng & Units 10/11/2020 08/16/2020 06/16/2020  WBC 3.8 - 10.8 Thousand/uL 7.3 7.8 9.1  Hemoglobin 13.2 - 17.1 g/dL 12.4(L) 11.7(L) 10.9(L)  Hematocrit 38.5 - 50.0 % 39.5 35.5(L) 35.7(L)  Platelets 140 - 400 Thousand/uL 306 285.0 212   Lab Results  Component Value Date   MCV 87.8 10/11/2020   MCV 85.4 08/16/2020   MCV 89.5 06/16/2020   Lab Results  Component Value Date   TSH 4.355 07/20/2019   Lab Results  Component Value Date   HGBA1C 9.1 (A) 12/18/2020     BNP    Component Value Date/Time   BNP 111.2 (H) 07/20/2019 1639    ProBNP    Component Value Date/Time   PROBNP 9.0 08/17/2013 1005     Lipid Panel     Component Value Date/Time   CHOL 155 08/16/2020 1030   TRIG 101.0 08/16/2020 1030   HDL 40.70 08/16/2020 1030   CHOLHDL 4 08/16/2020 1030   VLDL 20.2 08/16/2020 1030   LDLCALC 94 08/16/2020 1030   LDLDIRECT 173.0 07/01/2018 1118     RADIOLOGY: No results found.    Additional studies/ records that were reviewed today include:   Emergent catheterization: September 29, 2018 Mid RCA lesion is 90% stenosed. Ost RPDA lesion is 30% stenosed. RPDA-1 lesion is 40% stenosed. RPDA-2 lesion is 50% stenosed. RPDA-3 lesion is 100% stenosed. Ost LAD lesion is 95% stenosed. Mid Cx to Dist Cx lesion is 90% stenosed. Ost 1st Diag lesion is 60% stenosed. Prox LAD to Mid LAD lesion is 40% stenosed. Mid LAD lesion is 30% stenosed. Ost LM to Mid LM lesion is 30% stenosed.   Acute inferior STEMI secondary to ulcerated plaque in the mid RCA with residual 90% stenosis with extensive thrombus but with brisk TIMI-3 flow and probable distal embolization to the apical portion of the PDA vessel.  There is moderate stenoses in the PDA proximal to the distal embolization.   Significant  concomitant CAD with 30% left main stenosis; 95% near ostial LAD stenosis, 60% diagonal stenosis with 40 and 30% diffuse mid LAD stenosis; normal high marginal ramus like vessel, with 90% diffuse stenosis in the proximal portion of a distal marginal branch prior to its bifurcation.   LVEDP 11 mm.   Transient development of tachycardia most likely paroxysmal atrial fibrillation with ventricular rate at 150 bpm which responded to metoprolol IV 2.5 mg x 2.   RECOMMENDATION: Angiograms were reviewed with Dr. Irish Lack as well as with Dr. Tharon Aquas Tright.  Although the culprit vessel is in the mid RCA which can be intervened upon, with the high-grade subtotal near ostial LAD stenosis in an area that may be difficult to stent due to potential jeopardy to a ramus-like vessel in the circumflex vessel in addition to the patient's concomitant CAD it was felt that the patient should  undergo elective CABG revascularization following stabilization.  As result he was started on Aggrastat bolus plus infusion and with plans to resume heparin.  An echo Doppler study will be done to reevaluate LV function.  He will be started on low-dose beta-blocker therapy, high potency statin therapy, with plans for CABG revascularization surgery later this week.    ASSESSMENT:    1. CAD in native artery   2. S/P CABG x 4   3. Essential hypertension   4. Palpitations   5. PAD (peripheral artery disease) (HCC)   6. Hyperlipidemia with target LDL less than 70   7. Hyperlipidemia associated with type 2 diabetes mellitus (HCC)   8. Type 2 diabetes mellitus with complication, with long-term current use of insulin (HCC)   9. Diabetic polyneuropathy associated with type 2 diabetes mellitus (HCC)   10. Diabetic foot ulcer with osteomyelitis [facility])     PLAN:  Martin [name] is a 75 year-old gentleman who has a history of hypertension, obesity, diabetes mellitus, as well as paroxysmal arrhythmias for several years.  He suffered an  acute inferolateral ST segment elevation sepsis secondary to ruptured plaque in his mid RCA resulting in distal embolization to the apical PDA vessel but at presentation to the hospital he was pain-free and due to concomitant CAD CABG revascularization was recommended.  An intraoperative TEE revealed normal LV function.  He had a LIMA placed to his LAD, vein graft to diagonal vessel, and a Y graft placed to the PDA and PLA.  His postoperative course was complicated by PAF, anemia, orthostatic hypotension, acute kidney injury, severe deconditioning and depression.  He required skilled nursing facility post hospitalization.  He had  been on amlodipine 5 mg, furosemide 40 mg, metoprolol 12.5 mg twice a day.   His renal function subsequently improved and losartan was added to his regimen.  I had not seen him in several years.  He recently developed a nonhealing wound of his foot with osteomyelitis and received several courses of antibiotics therapy as well as hyperbaric chamber treatment without benefit.  Ultimately he underwent left lower extremity angiogram, laser atherectomy of the popliteal and proximal anterior tibial arteries with balloon and drug-coated angioplasty in February 2022 by Dr. Elby Showers in interventional radiology.  With reestablishment of improved blood flow, his wound ultimately healed.  Presently his blood pressure has been elevated and he does experience occasional palpitations.  I have recommended he change metoprolol to tartrate 50 mg twice a day to metoprolol succinate 75 mg daily.  This hopefully will also provide benefit to his blood pressure swings.  He continues to be on valsartan 50 mg twice a day.  He was unable to tolerate statins including Livalo and continues to be on Zetia 10 mg.  With his significant CAD he is a candidate for Repatha and I have referred him to pharmacy for initiation of Repatha therapy.  He is diabetic with diabetic neuropathy followed by Dr. Everardo All.  He has a  follow-up appointment also to see Dr. Daiva Eves regarding his.  previous osteomyelitis.  We will recheck a comprehensive metabolic panel and lipid panel and TSH prior to his pharmacy appointment for Repatha initiation.  I will see him in 3 months for reevaluation.   Medication Adjustments/Labs and Tests Ordered: Current medicines are reviewed at length with the patient today.  Concerns regarding medicines are outlined above.  Medication changes, Labs and Tests ordered today are listed in the Patient Instructions below. Patient Instructions  Medication Instructions:  Stop taking metoprolol tartrate. Start taking metoprolol succinate 75 mg (1 and 1/2 tablets) each night. *If you need a refill on your cardiac medications before your next appointment, please call your pharmacy*   Lab Work: Get a fasting blood work (lipid panel, CMET, TSH) before pharmacy appointment. Get blood work before your next appointment with Dr. Claiborne Billings (CMET and TSH) If you have labs (blood work) drawn today and your tests are completely normal, you will receive your results only by: [facility] (if you have MyChart) OR A paper copy in the mail If you have any lab test that is abnormal or we need to change your treatment, we will call you to review the results.    Follow-Up: At [facility], you and your health needs are our priority.  As part of our continuing mission to provide you with exceptional heart care, we have created designated Provider Care Teams.  These Care Teams include your primary Cardiologist (physician) and Advanced Practice Providers (APPs -  Physician Assistants and Nurse Practitioners) who all work together to provide you with the care you need, when you need it.  We recommend signing up for the patient portal called "MyChart".  Sign up information is provided on this After Visit Summary.  MyChart is used to connect with patients for Virtual Visits (Telemedicine).  Patients are able to view  lab/test results, encounter notes, upcoming appointments, etc.  Non-urgent messages can be sent to your provider as well.   To learn more about what you can do with MyChart, go to [email or web address].    Your next appointment:   3 month(s)  The format for your next appointment:   In Person  Provider:   Shelva Majestic, MD    Other Instructions Meet with pharmacist to discuss rapatha medication.   Signed, Shelva Majestic, MD  03/18/2021 8:52 PM    [facility] [address], [place], [place], [place]  [address] Phone: [phone]

## 2021-03-13 DIAGNOSIS — E113213 Type 2 diabetes mellitus with mild nonproliferative diabetic retinopathy with macular edema, bilateral: Secondary | ICD-10-CM | POA: Diagnosis not present

## 2021-03-13 DIAGNOSIS — D3131 Benign neoplasm of right choroid: Secondary | ICD-10-CM | POA: Diagnosis not present

## 2021-03-13 DIAGNOSIS — H40023 Open angle with borderline findings, high risk, bilateral: Secondary | ICD-10-CM | POA: Diagnosis not present

## 2021-03-13 DIAGNOSIS — H35013 Changes in retinal vascular appearance, bilateral: Secondary | ICD-10-CM | POA: Diagnosis not present

## 2021-03-18 ENCOUNTER — Encounter: Payer: Self-pay | Admitting: Cardiovascular Disease

## 2021-03-27 ENCOUNTER — Ambulatory Visit: Payer: PPO | Admitting: Family Medicine

## 2021-04-02 ENCOUNTER — Ambulatory Visit: Payer: PPO

## 2021-04-03 ENCOUNTER — Other Ambulatory Visit: Payer: Self-pay

## 2021-04-03 ENCOUNTER — Ambulatory Visit (INDEPENDENT_AMBULATORY_CARE_PROVIDER_SITE_OTHER): Payer: PPO | Admitting: Infectious Disease

## 2021-04-03 ENCOUNTER — Encounter: Payer: Self-pay | Admitting: Infectious Disease

## 2021-04-03 VITALS — BP 161/83 | HR 68 | Temp 97.6°F | Wt 278.0 lb

## 2021-04-03 DIAGNOSIS — B351 Tinea unguium: Secondary | ICD-10-CM | POA: Diagnosis not present

## 2021-04-03 DIAGNOSIS — I152 Hypertension secondary to endocrine disorders: Secondary | ICD-10-CM

## 2021-04-03 DIAGNOSIS — N1832 Chronic kidney disease, stage 3b: Secondary | ICD-10-CM | POA: Diagnosis not present

## 2021-04-03 DIAGNOSIS — M869 Osteomyelitis, unspecified: Secondary | ICD-10-CM

## 2021-04-03 DIAGNOSIS — I739 Peripheral vascular disease, unspecified: Secondary | ICD-10-CM

## 2021-04-03 DIAGNOSIS — E1159 Type 2 diabetes mellitus with other circulatory complications: Secondary | ICD-10-CM | POA: Diagnosis not present

## 2021-04-03 DIAGNOSIS — E1342 Other specified diabetes mellitus with diabetic polyneuropathy: Secondary | ICD-10-CM

## 2021-04-03 NOTE — Progress Notes (Signed)
Subjective:   Chief complaint: follow-up for diabetic foot ulcer    Patient ID: Martin Agee Sr., male    DOB: [birth year], 75 y.o.   MRN: [id number]  HPI  [name] is a 75 year-old Caucasian man with a past medical history significant for coronary artery disease status post coronary bypass grafting, diabetes mellitus peripheral artery disease who has been struggling with an ulcer in his foot for several years now.     Unfortunately his developed osteomyelitis.  He was referred to Dr. Sharol Given who offered him surgery but the patient was not ready to do this yet.   There is also concern about his ability to heal any surgery in his foot due to his peripheral vascular disease.   He may in fact require a proximal amputation such as a below the knee amputation for cure.   At present he is in a very difficult situation socially and that he is caring for his wife and is the sole caregiver.  His wife has metastatic breast cancer that metastasized to bone and she is currently bedbound.   He is not ready to have any type of surgery including surgery on the foot at this point in time but wants to see his wife improving first over more resolution on not from prior to allowing himself to be hospitalized for a surgical intervention.   He has been followed closely by the wound care clinic and Dr. Dellia Nims who gave him a prescription for Zyvox when the patient developed erythema that went up his leg and swelling.   He responded well to Zyvox he also had had DVT excluded through duplex.   He has had MRSA isolated from cultures from the wound center which has been resistant to tetracycline clindamycin fluoroquinolones but sensitive to Bactrim and vancomycin   The erythema in his leg had down considerably since he is received his course of Zyvox  I observed him off antibiotics but there was a great desire his part on the prior Dr. Dellia Nims to see what systemic antibiotics would do.  Therefore we started IV  daptomycin with a goal of getting through 8 weeks of therapy  He then had revascularization and initiated daptomycin that he had dramatic improvement in his wound which is now closed.  He said it improved within a matter of 2 weeks.   His wound indeed had healed up quite nicely and continues to look dramatically improved.  I have continued to have anxiety about the underlying osteomyelitis.  Unfortunately there are not really many good oral options.  I did not want to hazard clindamycin and potentially cause C. difficile colitis.  He has high potassium at baseline and chronic kidney disease making Bactrim dangerous and undesirable antibiotic as well.  We could see if he might build to get pharmaceutical assistance for tedezolid or possibly omadacycline being a Medicare patient may make this more complicated.  We could also investigate this another option would be rounds of long-acting oritavancin or dalbavancin. I do not think though that these really viable long-term options.  Because of this we decided to observe him off of antibiotics and he seems to have been doing quite well.  He has had no clinical evidence of recurrence of his osteomyelitis.  He has been seen by interventional radiology to assess his blood supply to his last visit with [place].  His foot continues to appear well.  There is no drainage from the wound.  He does not experience pain at  that site but that is confounded by his neuropathy.  His inflammatory markers have trended down.  I last saw Martin [name] in August and he is continue to remain off antibiotics without evidence of recurrent infection.  Wound is completely healed over and there is a hard to discern scab present.  His wife is on oral chemotherapy for metastatic breast cancer to bone (and now some concern re lungs).      .  Past Medical History:  Diagnosis Date   A-fib ([place])    CKD (chronic kidney disease) stage 3, GFR 30-59 ml/min (HCC) 08/29/2020   Diabetes  mellitus    Heart attack ([place]) 10/01/2018   Pt had open heart surgery   Hyperkalemia 08/29/2020   Hypertension    Osteomyelitis of fifth toe of left foot ([place]) 05/08/2020   Simple chronic bronchitis ([place]) 05/08/2020   Tobacco abuse     Past Surgical History:  Procedure Laterality Date   CORONARY ARTERY BYPASS GRAFT N/A 10/01/2018   Procedure: CORONARY ARTERY BYPASS GRAFTING (CABG) TIMES  FOUR USING LEFT MAMMARY ARTERY AND RIGHT GREATER SAPHEANOUS VEIN HARVESTED ENDOSCOPICALLY;  Surgeon: Melrose Nakayama, MD;  Location: [facility];  Service: Open Heart Surgery;  Laterality: N/A;   HERNIA REPAIR     >10 years ago   IR ANGIOGRAM EXTREMITY LEFT  06/16/2020   IR RADIOLOGIST EVAL & MGMT  12/24/2016   IR RADIOLOGIST EVAL & MGMT  05/30/2020   IR RADIOLOGIST EVAL & MGMT  07/13/2020   IR RADIOLOGIST EVAL & MGMT  09/06/2020   IR RADIOLOGIST EVAL & MGMT  12/26/2020   IR THORACENTESIS ASP PLEURAL SPACE W/IMG GUIDE  10/06/2018   IR US GUIDE VASC ACCESS RIGHT  06/16/2020   LEFT HEART CATH AND CORONARY ANGIOGRAPHY N/A 09/29/2018   Procedure: LEFT HEART CATH AND CORONARY ANGIOGRAPHY;  Surgeon: Troy Sine, MD;  Location: [facility];  Service: Cardiovascular;  Laterality: N/A;   open heart surfery     right shoulder surgery     around 2014   TEE WITHOUT CARDIOVERSION N/A 10/01/2018   Procedure: TRANSESOPHAGEAL ECHOCARDIOGRAM (TEE);  Surgeon: Melrose Nakayama, MD;  Location: [facility];  Service: Open Heart Surgery;  Laterality: N/A;    Family History  Problem Relation Age of Onset   Hypertension Mother    Heart disease Mother        CHF did not see doctor   Diabetes Maternal Grandmother    Diabetes Son       Social History   Socioeconomic History   Marital status: Married    Spouse name: Not on file   Number of children: Not on file   Years of education: Not on file   Highest education level: Not on file  Occupational History   Not on file  Tobacco Use   Smoking status: Former     Packs/day: 0.75    Years: 2.00    Pack years: 1.50    Types: Cigarettes    Start date: 2002    Quit date: 04/29/2002    Years since quitting: 18.9   Smokeless tobacco: Never   Tobacco comments:    smoked for 10 years on and off  Substance and Sexual Activity   Alcohol use: Yes    Alcohol/week: 0.0 standard drinks    Comment: 6 martinis a year   Drug use: No   Sexual activity: Yes  Other Topics Concern   Not on file  Social History Narrative   Married. 3  kids. 7 grandkids. No greatgrandkids.       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      [employer]: golf previously, Haematologist   Social Determinants of Radio broadcast assistant Strain: Not on [employer] Insecurity: Not on file  Transportation Needs: Not on file  Physical Activity: Not on file  Stress: Not on file  Social Connections: Not on file    Allergies  Allergen Reactions   Simvastatin Diarrhea   Statins Other (See Comments)    achey joints   Xanax [Alprazolam] Anxiety     Current Outpatient Medications:    aspirin 81 MG EC tablet, Take 1 tablet (81 mg total) by mouth daily., Disp: , Rfl:    azelastine (ASTELIN) 0.1 % nasal spray, Place 1 spray into both nostrils 2 (two) times daily., Disp: 30 mL, Rfl: 12   clopidogrel (PLAVIX) 75 MG tablet, Take 1 tablet (75 mg total) by mouth daily., Disp: 90 tablet, Rfl: 3   Continuous Blood Gluc Receiver (FREESTYLE LIBRE 2 READER) DEVI, See admin instructions., Disp: , Rfl:    Continuous Blood Gluc Sensor (FREESTYLE LIBRE 2 SENSOR) MISC, 1 Device by Does not apply route every 14 (fourteen) days., Disp: 6 each, Rfl: 3   ezetimibe (ZETIA) 10 MG tablet, Take 1 tablet (10 mg total) by mouth daily., Disp: 90 tablet, Rfl: 3   fexofenadine (ALLEGRA) 180 MG tablet, Take 180 mg by mouth daily as needed for allergies or rhinitis., Disp: , Rfl:    fluticasone (FLONASE) 50 MCG/ACT nasal spray, Place 2 sprays into both nostrils daily as needed for allergies  or rhinitis., Disp: , Rfl:    insulin aspart (NOVOLOG FLEXPEN) 100 UNIT/ML FlexPen, Inject 25 Units into the skin 3 (three) times daily with meals. 3 times a day (just before each meal) 25-20-25 units, and pen needles 4/day, Disp: 75 mL, Rfl: 3   Insulin Pen Needle (NOVOFINE) 32G X 6 MM MISC, USE 5 TIMES DAILY, Disp: 450 each, Rfl: 2   loratadine (CLARITIN) 10 MG tablet, Take 10 mg by mouth as needed., Disp: , Rfl:    losartan (COZAAR) 50 MG tablet, Take 1 tablet (50 mg total) by mouth in the morning and at bedtime., Disp: 180 tablet, Rfl: 3   metoprolol succinate (TOPROL-XL) 50 MG 24 hr tablet, Take 1.5 tablets (75 mg total) by mouth at bedtime. Take with or immediately following a meal., Disp: 90 tablet, Rfl: 5   ONETOUCH VERIO test strip, CHECK LEVELS 4 TIMES DAILY, Disp: 100 strip, Rfl: 2   Pitavastatin Calcium (LIVALO) 1 MG TABS, Take 1 tablet (1 mg total) by mouth daily., Disp: 90 tablet, Rfl: 3   torsemide (DEMADEX) 20 MG tablet, MUST MAKE APPOINTMENT FOR FUTURE REFILL LAST ATTEMPT. TAKE 2 TABLETS BY MOUTH EVERY DAY, Disp: 30 tablet, Rfl: 0   vitamin C (ASCORBIC ACID) 250 MG tablet, Take 250 mg by mouth daily., Disp: , Rfl:    pantoprazole (PROTONIX) 20 MG tablet, Take 1 tablet (20 mg total) by mouth daily. (Patient not taking: Reported on 03/12/2021), Disp: 90 tablet, Rfl: 1   Semaglutide,0.25 or 0.5MG /DOS, (OZEMPIC, 0.25 OR 0.5 MG/DOSE,) 2 MG/1.5ML SOPN, Inject 0.5 mg into the skin once a week. (Patient not taking: Reported on 03/12/2021), Disp: 4.5 mL, Rfl: 3   Review of Systems  Constitutional:  Negative for activity change, appetite change, chills, diaphoresis, fatigue, fever and unexpected weight change.  HENT:  Negative for congestion, rhinorrhea, sinus pressure, sneezing, sore  throat and trouble swallowing.   Eyes:  Negative for photophobia and visual disturbance.  Respiratory:  Negative for cough, chest tightness, shortness of breath, wheezing and stridor.   Cardiovascular:   Negative for chest pain, palpitations and leg swelling.  Gastrointestinal:  Negative for abdominal distention, abdominal pain, anal bleeding, blood in stool, constipation, diarrhea, nausea and vomiting.  Genitourinary:  Negative for difficulty urinating, dysuria, flank pain and hematuria.  Musculoskeletal:  Negative for arthralgias, back pain, gait problem, joint swelling and myalgias.  Skin:  Negative for color change, pallor, rash and wound.  Neurological:  Negative for dizziness, tremors, weakness, light-headedness and headaches.  Hematological:  Negative for adenopathy. Does not bruise/bleed easily.  Psychiatric/Behavioral:  Negative for agitation, behavioral problems, confusion, decreased concentration, dysphoric mood, sleep disturbance and suicidal ideas.       Objective:   Physical Exam Constitutional:      Appearance: He is well-developed.  HENT:     Head: Normocephalic and atraumatic.  Eyes:     Conjunctiva/sclera: Conjunctivae normal.  Cardiovascular:     Rate and Rhythm: Normal rate and regular rhythm.  Pulmonary:     Effort: Pulmonary effort is normal. No respiratory distress.     Breath sounds: No wheezing.  Abdominal:     General: There is no distension.     Palpations: Abdomen is soft.  Musculoskeletal:        General: No tenderness. Normal range of motion.     Cervical back: Normal range of motion and neck supple.  Skin:    General: Skin is warm and dry.     Coloration: Skin is not pale.     Findings: No erythema or rash.  Neurological:     General: No focal deficit present.     Mental Status: He is alert and oriented to person, place, and time.  Psychiatric:        Mood and Affect: Mood normal.        Behavior: Behavior normal.        Thought Content: Thought content normal.        Judgment: Judgment normal.   Foot   06/05/2020:        06/16/2020:       08/02/2020:     Foot Aug 29, 2020:       10/11/2020:     December 18, 2020:    04/03/2021:         Assessment & Plan:  Osteomyelitis of left foot with peripheral vascular disease status post revascularization and treatment with systemic IV antibiotics.  We have been continue to observe down is off antibiotics and has not manifested any evidence of recurrence clinically.  I will recheck sed rate CRP BMP and CBC with differential  He does also still have Zyvox at home they can use if he has evidence of infection recurring so that he can start therapy promptly  He would like to follow-up in roughly 3 months time.  Peripheral vascular disease status post revascularization  Diabetic neuropathy: complicates sensing of pain but he seems to be quite dligent in examining feet  Hypertension: BP worse in our clinic again. Not sure if some degree of white coat HTN  Vitals:   04/03/21 0828  BP: (!) 161/83  Pulse: 68  Temp: 97.6 F (36.4 C)  SpO2: 98%   Chronic kidney disease: Last metabolic panels been stable and actually improved renal function rechecking a BMP today  BMP Latest Ref Rng & Units 12/18/2020 10/11/2020 08/16/2020  Glucose 65 - 99 mg/dL 177(H) 183(H) 178(H)  BUN 7 - 25 mg/dL 25 20 24(H)  Creatinine 0.70 - 1.28 mg/dL 1.34(H) 1.40(H) 1.37  BUN/Creat Ratio 6 - 22 (calc) 19 14 -  Sodium 135 - 146 mmol/L 140 138 138  Potassium 3.5 - 5.3 mmol/L 5.2 5.4(H) 5.2 No hemolysis seen(H)  Chloride 98 - 110 mmol/L 104 102 102  CO2 20 - 32 mmol/L 27 28 26   Calcium 8.6 - 10.3 mg/dL 9.4 9.3 9.4     History of C. difficile colitis: Again need to be cautious with antibiotics and him

## 2021-04-04 LAB — BASIC METABOLIC PANEL WITH GFR
BUN/Creatinine Ratio: 15 (calc) (ref 6–22)
BUN: 22 mg/dL (ref 7–25)
CO2: 30 mmol/L (ref 20–32)
Calcium: 9.4 mg/dL (ref 8.6–10.3)
Chloride: 104 mmol/L (ref 98–110)
Creat: 1.45 mg/dL — ABNORMAL HIGH (ref 0.70–1.28)
Glucose, Bld: 154 mg/dL — ABNORMAL HIGH (ref 65–99)
Potassium: 5.4 mmol/L — ABNORMAL HIGH (ref 3.5–5.3)
Sodium: 140 mmol/L (ref 135–146)
eGFR: 50 mL/min/{1.73_m2} — ABNORMAL LOW (ref >=60)

## 2021-04-04 LAB — CBC WITH DIFFERENTIAL/PLATELET
Absolute Monocytes: 596 cells/uL (ref 200–950)
Basophils Absolute: 28 cells/uL (ref 0–200)
Basophils Relative: 0.4 %
Eosinophils Absolute: 178 cells/uL (ref 15–500)
Eosinophils Relative: 2.5 %
HCT: 41.7 % (ref 38.5–50.0)
Hemoglobin: 13.4 g/dL (ref 13.2–17.1)
Lymphs Abs: 2102 cells/uL (ref 850–3900)
MCH: 29.6 pg (ref 27.0–33.0)
MCHC: 32.1 g/dL (ref 32.0–36.0)
MCV: 92.3 fL (ref 80.0–100.0)
MPV: 10.2 fL (ref 7.5–12.5)
Monocytes Relative: 8.4 %
Neutro Abs: 4196 cells/uL (ref 1500–7800)
Neutrophils Relative %: 59.1 %
Platelets: 262 10*3/uL (ref 140–400)
RBC: 4.52 10*6/uL (ref 4.20–5.80)
RDW: 13 % (ref 11.0–15.0)
Total Lymphocyte: 29.6 %
WBC: 7.1 10*3/uL (ref 3.8–10.8)

## 2021-04-04 LAB — C-REACTIVE PROTEIN: CRP: 2.2 mg/L (ref <8.0)

## 2021-04-04 LAB — SEDIMENTATION RATE: Sed Rate: 19 mm/h (ref 0–20)

## 2021-04-10 IMAGING — CR DG FOOT COMPLETE 3+V*L*
3 series · 3 of 3 positions shown · non-contrast
Comparison: MRI left foot 01/16/2020.

CLINICAL DATA: Nonhealing ulcer over the left fifth distal
metatarsal bone.

EXAM:
LEFT FOOT - COMPLETE 3+ VIEW

[x foot ap left]
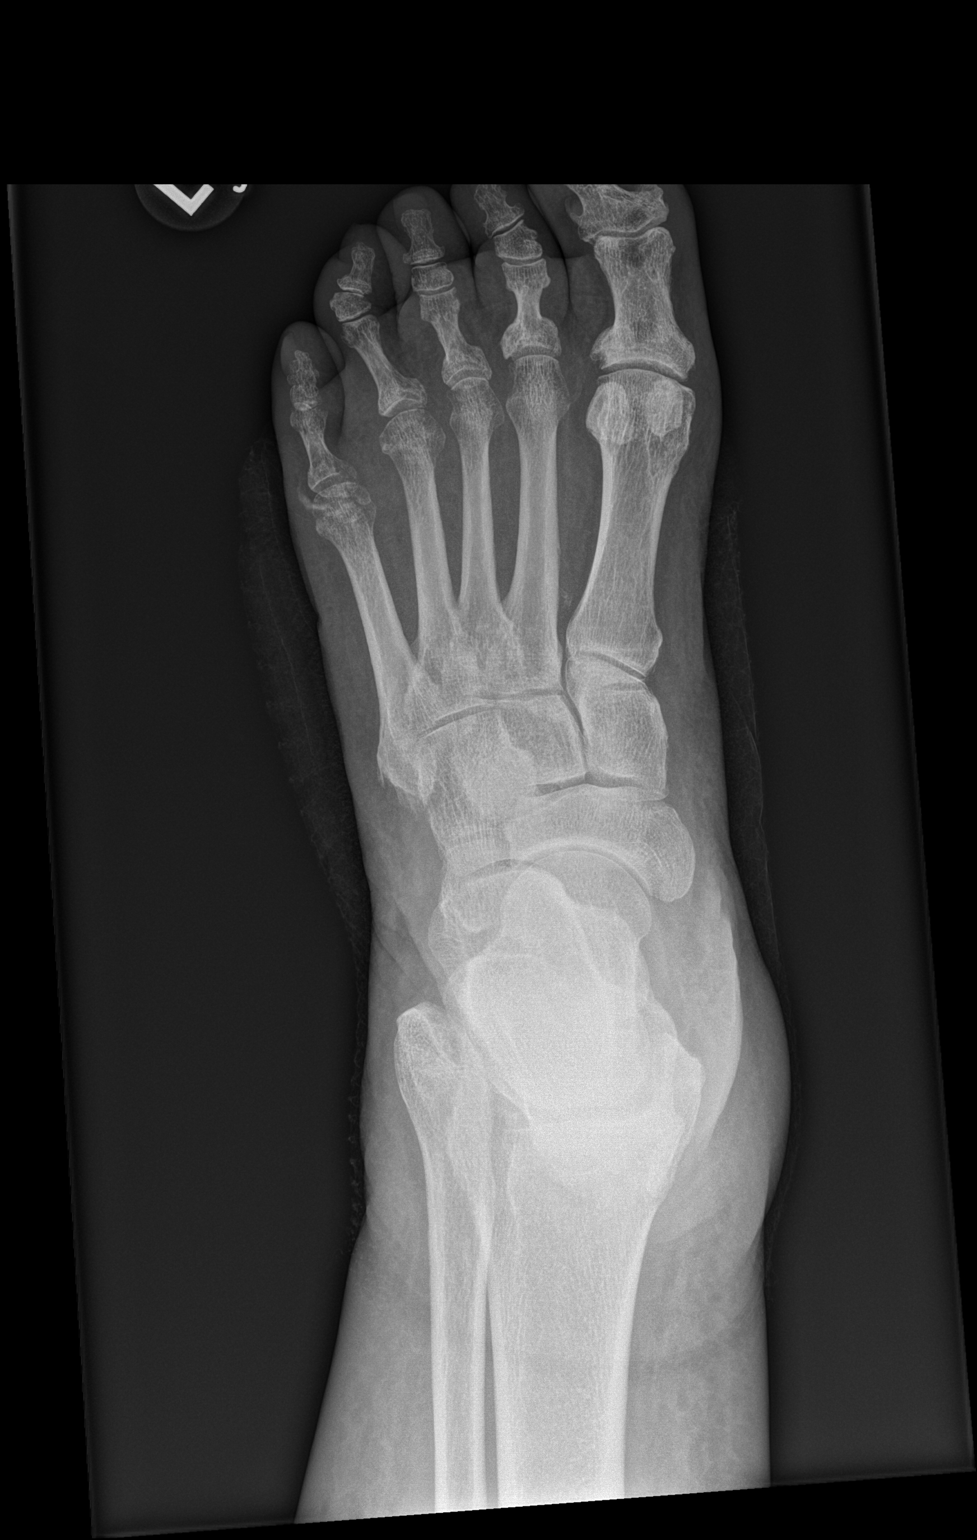

[x foot obl left]
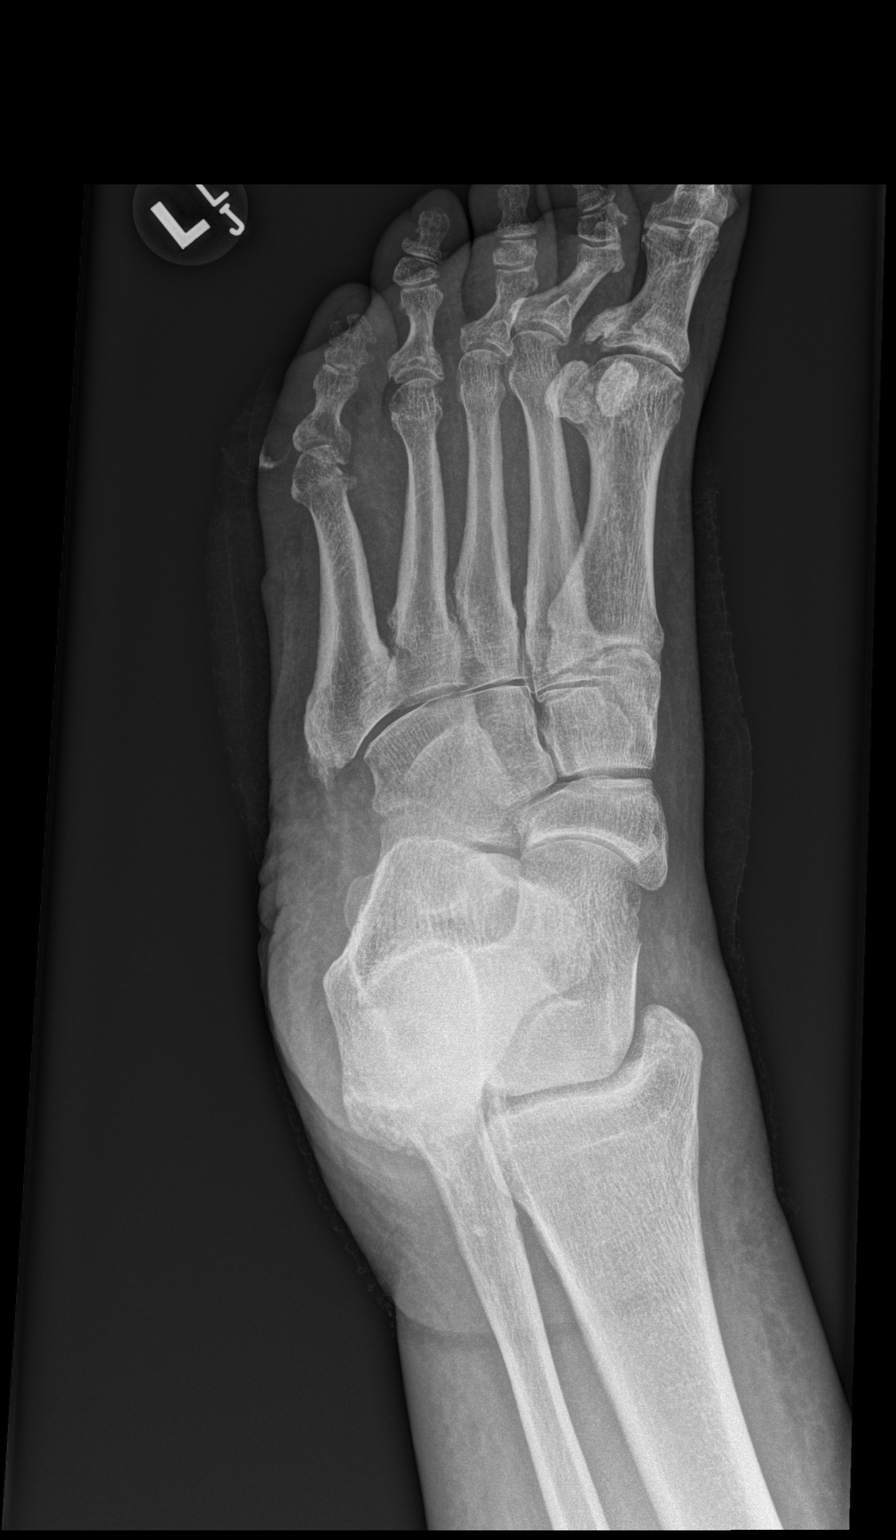

[x foot lat left]
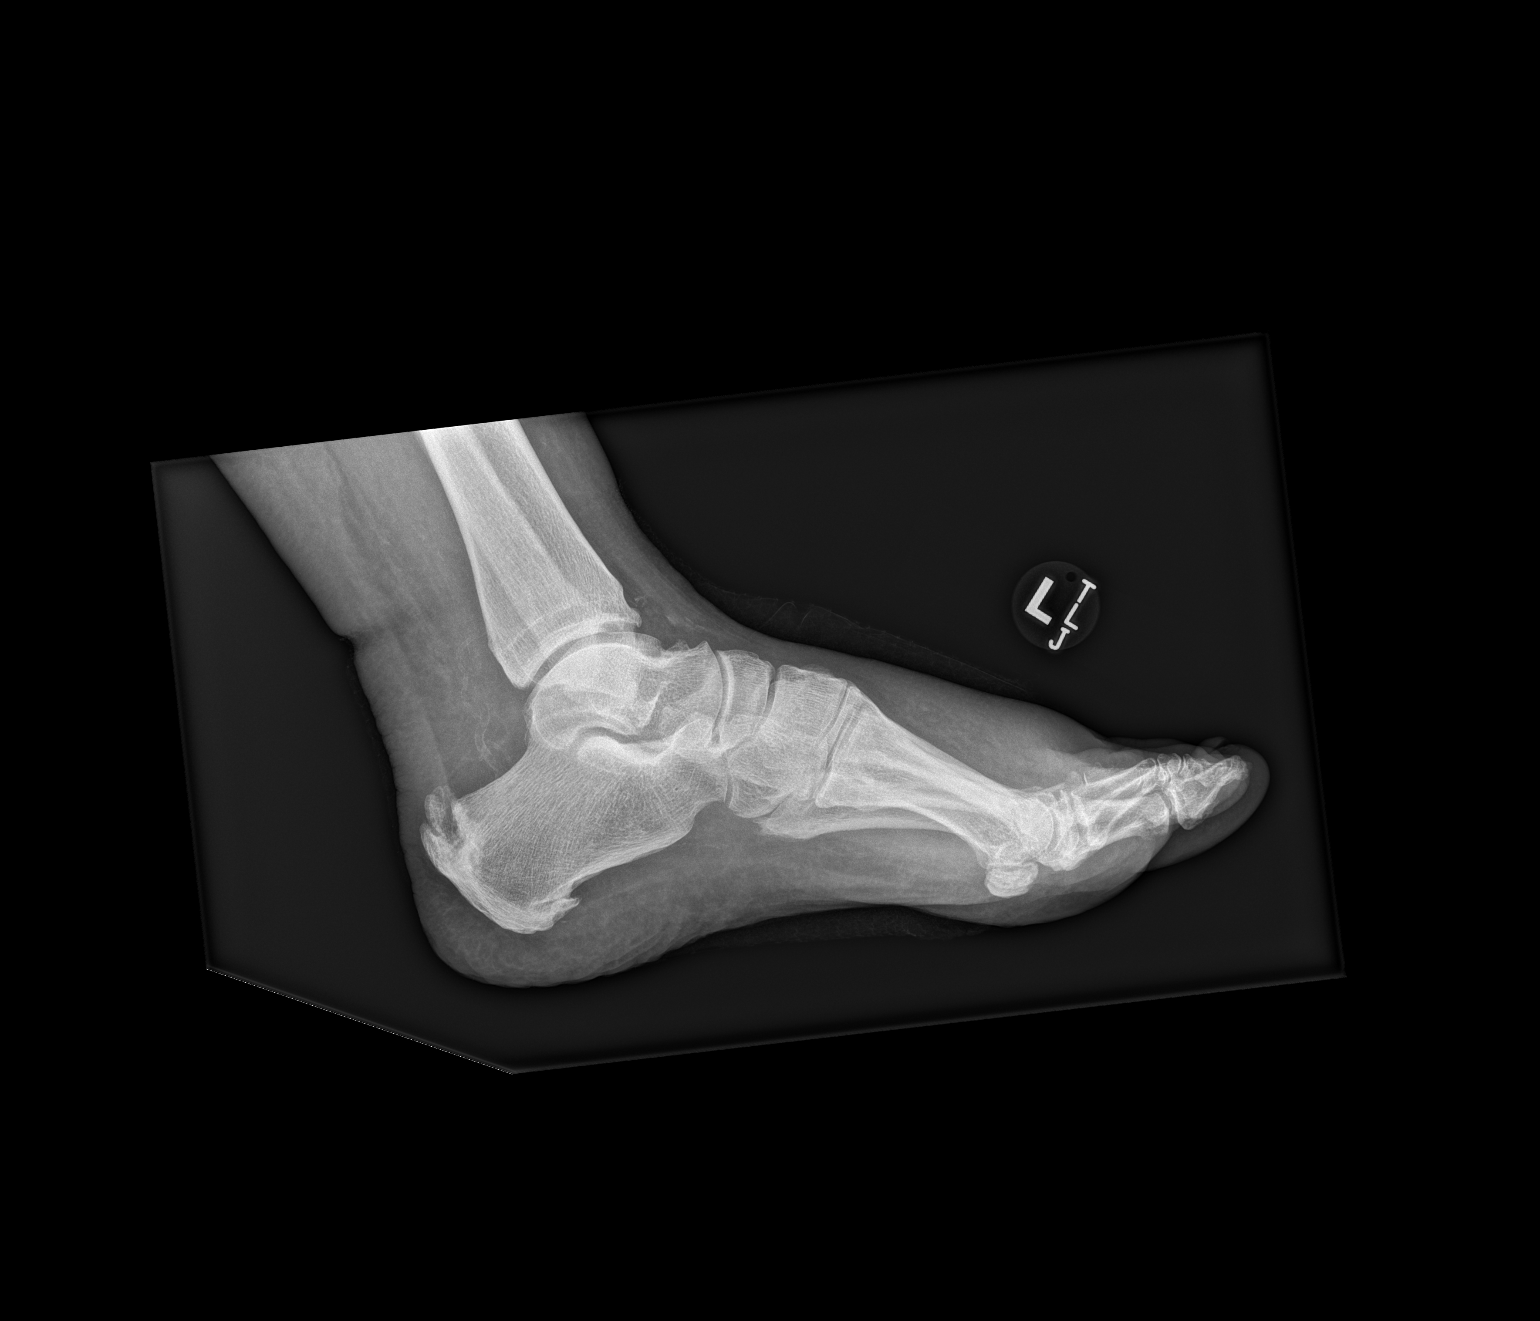

[3 of 3 positions shown; findings below may reference images not displayed]

FINDINGS: Dorsal soft tissue swelling is noted overlying the MTP joints. The
bones appear mildly osteopenic. No focal bone erosions identified to
indicate osteomyelitis. No fractures or dislocations.
IMPRESSION: 1. No distinct bone erosions of osteomyelitis identified.
2. Dorsal soft tissue swelling.
3. Osteopenia.

## 2021-05-01 ENCOUNTER — Ambulatory Visit: Payer: PPO | Admitting: Family Medicine

## 2021-05-01 ENCOUNTER — Ambulatory Visit: Payer: PPO

## 2021-05-14 NOTE — Progress Notes (Signed)
Phone [phone] In person visit   Subjective:   Martin KRIESEL Sr. is a 76 y.o. year old very pleasant male patient who presents for/with See problem oriented charting Chief Complaint  Patient presents with   Follow-up    Left sided chest pain, Persistent productive cough    This visit occurred during the SARS-CoV-2 public health emergency.  Safety protocols were in place, including screening questions prior to the visit, additional usage of staff PPE, and extensive cleaning of exam room while observing appropriate contact time as indicated for disinfecting solutions.   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    Priority: Medium    PAD (peripheral artery disease) ([place]) 08/20/2017    Priority: Medium    Partial tear of right Achilles tendon 02/12/2017    Priority: Medium    Aortic atherosclerosis ([place]) 10/07/2016    Priority: Medium    History of skin cancer 08/15/2015    Priority: Medium    Diabetic retinopathy ([place]) 02/21/2015    Priority: Medium    Hyperlipidemia associated with type 2 diabetes mellitus ([place]) 08/24/2014    Priority: Medium    Diabetic polyneuropathy ([place]) 04/11/2009    Priority: Medium    Hypertension associated with diabetes ([place]) 02/03/2007    Priority: Medium    Acute blood loss as cause of postoperative anemia 10/24/2018    Priority: Low   BPH (benign prostatic hyperplasia) 10/24/2018    Priority: Low   History of depression 10/24/2018    Priority: Low   S/P CABG x 4 10/01/2018    Priority: Low   Onychomycosis of toenail 02/17/2018    Priority: Low   Allergic rhinitis 08/20/2017    Priority: Low   C. difficile colitis 09/25/2016    Priority: Low   History of  aspiration pneumonia 09/25/2016    Priority: Low   Former smoker 08/24/2014    Priority: Low   Ingrowing toenail 07/19/2014    Priority: Low   BACK PAIN, LUMBAR, WITH RADICULOPATHY 08/30/2008    Priority: Low   Hyperkalemia 08/29/2020   CKD (chronic kidney disease) stage 3, GFR 30-59 ml/min (HCC) 08/29/2020   Osteomyelitis of fifth toe of left foot ([place]) 05/08/2020   Simple chronic bronchitis ([place]) 05/08/2020   Diabetes ([place]) 12/14/2009    Medications- reviewed and updated Current Outpatient Medications  Medication Sig Dispense Refill   aspirin 81 MG EC tablet Take 1 tablet (81 mg total) by mouth daily.     azelastine (ASTELIN) 0.1 % nasal spray Place 1 spray into both nostrils 2 (two) times daily. 30 mL 12   clopidogrel (PLAVIX) 75 MG tablet Take 1 tablet (75 mg total) by mouth daily. 90 tablet 3   Continuous Blood Gluc Receiver (FREESTYLE LIBRE 2 READER) DEVI See admin instructions.     Continuous Blood Gluc Sensor (FREESTYLE LIBRE 2 SENSOR) MISC 1 Device by Does not apply route every 14 (fourteen) days. 6 each 3   ezetimibe (ZETIA) 10 MG tablet Take 1 tablet (10 mg total) by mouth daily. 90 tablet 3   fexofenadine (ALLEGRA) 180 MG tablet Take 180 mg by mouth daily as needed for allergies or rhinitis.     fluticasone (FLONASE)  50 MCG/ACT nasal spray Place 2 sprays into both nostrils daily as needed for allergies or rhinitis.     insulin aspart (NOVOLOG FLEXPEN) 100 UNIT/ML FlexPen Inject 25 Units into the skin 3 (three) times daily with meals. 3 times a day (just before each meal) 25-20-25 units, and pen needles 4/day 75 mL 3   Insulin Pen Needle (NOVOFINE) 32G X 6 MM MISC USE 5 TIMES DAILY 450 each 2   loratadine (CLARITIN) 10 MG tablet Take 10 mg by mouth as needed.     losartan (COZAAR) 50 MG tablet Take 1 tablet (50 mg total) by mouth in the morning and at bedtime. 180 tablet 3   metoprolol succinate (TOPROL-XL) 50 MG 24 hr tablet Take 1.5 tablets (75 mg total) by mouth at  bedtime. Take with or immediately following a meal. 90 tablet 5   ONETOUCH VERIO test strip CHECK LEVELS 4 TIMES DAILY 100 strip 2   pantoprazole (PROTONIX) 20 MG tablet Take 1 tablet (20 mg total) by mouth daily. 90 tablet 1   Pitavastatin Calcium (LIVALO) 1 MG TABS Take 1 tablet (1 mg total) by mouth daily. 90 tablet 3   Semaglutide,0.25 or 0.5MG /DOS, (OZEMPIC, 0.25 OR 0.5 MG/DOSE,) 2 MG/1.5ML SOPN Inject 0.5 mg into the skin once a week. (Patient not taking: Reported on 03/12/2021) 4.5 mL 3   torsemide (DEMADEX) 20 MG tablet MUST MAKE APPOINTMENT FOR FUTURE REFILL LAST ATTEMPT. TAKE 2 TABLETS BY MOUTH EVERY DAY 30 tablet 0   vitamin C (ASCORBIC ACID) 250 MG tablet Take 250 mg by mouth daily.     No current facility-administered medications for this visit.     Objective:  BP 130/70 (BP Location: Left Arm, Patient Position: Sitting, Cuff Size: Large) Comment: most recent home reading   Pulse 76    Temp 98 F (36.7 C) (Temporal)    Ht 6' (1.829 m)    Wt 274 lb (124.3 kg)    SpO2 97%    BMI 37.16 kg/m  Gen: NAD, resting comfortably CV: RRR no murmurs rubs or gallops Lungs: CTAB other than faint crackles right lung base and slight wheeze Ext: trace to 1+ edema Skin: warm, dry    Assessment and Plan    #Chronic cough since November 2021 S: Normal chest x-ray 06/30/2020 -Improvement with treatment for sinusitis in the past with Augmentin  -in regards to allergies- astelin did not help significantly nor did flonase.   - currently on mucinex and claritin.  - never received pantoprazole rx from 01/25/21- rx again today in case reflux contributing.  -Originally held off on prednisone due to patient's treatment for osteomyelitis with PICC line. Thankfully wound has not reopened but he worries about this. Off antibiotics since last July.  -former smoker quit 2004 only smoked 2-3 years A/P: Chronic cough now 15 months that has not responded to allergy medicine or augmentin for sinusitis - with  chronicity now 15 months and reassuring x-ray and lack of resolution with prior antibiotics- will get Chest CT. Still with some crackles in right lung base but stable from last visit.  - did have some left upper chest pain with cough- he worries about aneurysm and CT would rule this out.  No exertional pain- suspect MSK.  - also refer to pulmonary  - start PPI  #CAD-no chest pain or shortness of breath  #Peripheral arterial disease- history of angioplasty in February 2022. Statin myalgias- tolerates livalo. No pain in legs when walks #hyperlipidemia-with LDL goal under 70  particular with aortic atherosclerosis S: Medication:Aspirin 81 mg daily , Plavix 75 mg daily (interventional radiology has recommended continuing aspirin and Plavix), Zetia 10 mg daily, and Livalo 1 mg daily  Lab Results  Component Value Date   CHOL 155 08/16/2020   HDL 40.70 08/16/2020   LDLCALC 94 08/16/2020   LDLDIRECT 173.0 07/01/2018   TRIG 101.0 08/16/2020   CHOLHDL 4 08/16/2020   A/P: CAD asymptomatic. Lipids not perfect but improved from peak. PAD asymptomatic. Sounds like may get plugged in with lipid clinic when things settle with wife.   #hypertension S: medication: Losartan 50 mg  up to BID,  metoprolol 50 mg  BID,  torsemide 40 mg in the past- now just as needed.  BP Readings from Last 3 Encounters:  05/23/21 130/70  04/03/21 (!) 161/83  03/12/21 130/86  A/P: slightly high in office today but well controlled at home- listed home reading above- continue current meds  Recommended follow up: No follow-ups on file. Future Appointments  Date Time Provider [place]  06/04/2021  8:00 AM Renato Shin, MD [facility] None  06/19/2021  9:00 AM Troy Sine, MD [facility]  06/21/2021  8:00 AM [facility] PH[facility]ACIST [facility]  07/25/2021  8:45 AM Truman Hayward, MD [facility]   Lab/Order associations:   ICD-10-CM   1. Coronary artery disease involving native coronary artery  of native heart without angina pectoris  I25.10     2. Hyperlipidemia associated with type 2 diabetes mellitus ([place])  E11.69    E78.5     3. Primary hypertension  I10     4. PAD (peripheral artery disease) (HCC)  I73.9     5. Chronic cough  R05.3 CT Chest Wo Contrast    Ambulatory referral to Pulmonology      Meds ordered this encounter  Medications   pantoprazole (PROTONIX) 20 MG tablet    Sig: Take 1 tablet (20 mg total) by mouth daily.    Dispense:  90 tablet    Refill:  1   I,Jada Bradford,acting as a scribe for [name], MD.,have documented all relevant documentation on the behalf of [name], MD,as directed by  [name], MD while in the presence of [name], MD.  I, [name], MD, have reviewed all documentation for this visit. The documentation on 05/23/21 for the exam, diagnosis, procedures, and orders are all accurate and complete.  Return precautions advised.  [name], MD

## 2021-05-23 ENCOUNTER — Ambulatory Visit (INDEPENDENT_AMBULATORY_CARE_PROVIDER_SITE_OTHER): Payer: PPO | Admitting: Family Medicine

## 2021-05-23 ENCOUNTER — Encounter: Payer: Self-pay | Admitting: Family Medicine

## 2021-05-23 ENCOUNTER — Other Ambulatory Visit: Payer: Self-pay

## 2021-05-23 ENCOUNTER — Ambulatory Visit: Payer: PPO

## 2021-05-23 VITALS — BP 130/70 | HR 76 | Temp 98.0°F | Ht 72.0 in | Wt 274.0 lb

## 2021-05-23 DIAGNOSIS — E1169 Type 2 diabetes mellitus with other specified complication: Secondary | ICD-10-CM

## 2021-05-23 DIAGNOSIS — I739 Peripheral vascular disease, unspecified: Secondary | ICD-10-CM | POA: Diagnosis not present

## 2021-05-23 DIAGNOSIS — I10 Essential (primary) hypertension: Secondary | ICD-10-CM

## 2021-05-23 DIAGNOSIS — I2510 Atherosclerotic heart disease of native coronary artery without angina pectoris: Secondary | ICD-10-CM | POA: Diagnosis not present

## 2021-05-23 DIAGNOSIS — E11311 Type 2 diabetes mellitus with unspecified diabetic retinopathy with macular edema: Secondary | ICD-10-CM

## 2021-05-23 DIAGNOSIS — R053 Chronic cough: Secondary | ICD-10-CM | POA: Diagnosis not present

## 2021-05-23 DIAGNOSIS — E785 Hyperlipidemia, unspecified: Secondary | ICD-10-CM | POA: Diagnosis not present

## 2021-05-23 MED ORDER — CLOPIDOGREL BISULFATE 75 MG PO TABS
75.0000 mg | ORAL_TABLET | Freq: Every day | ORAL | 3 refills | Status: DC
Start: 2021-05-23 — End: 2022-04-12

## 2021-05-23 MED ORDER — PANTOPRAZOLE SODIUM 20 MG PO TBEC: 20.0000 mg | DELAYED_RELEASE_TABLET | Freq: Every day | ORAL | 1 refills | Status: DC

## 2021-05-23 NOTE — Patient Instructions (Addendum)
Health Maintenance Due  Topic Date Due   Zoster Vaccines- Shingrix (1 of 2)  Please check with your pharmacy to see if they have the shingrix vaccine. If they do- please get this immunization and update [place] by phone call or mychart with dates you receive the vaccine  Never done   We will call you within two weeks about your referral to CT chest and pulmonary visit. If you do not hear within 2 weeks, give [place] a call.   Start protonix to see if treating possible reflux helps  Restart plavix/clopidogreal- Dr. Serafina Royals had wanted to continue this  Recommended follow up: Return in about 3 months (around 08/21/2021). I doubt we have a physical available then so maybe schedule 3 month follow up then next available physical around 6 months

## 2021-05-29 ENCOUNTER — Ambulatory Visit: Payer: PPO | Admitting: Endocrinology

## 2021-05-30 ENCOUNTER — Other Ambulatory Visit: Payer: Self-pay | Admitting: Interventional Radiology

## 2021-05-30 DIAGNOSIS — L97401 Non-pressure chronic ulcer of unspecified heel and midfoot limited to breakdown of skin: Secondary | ICD-10-CM

## 2021-05-30 DIAGNOSIS — E08621 Diabetes mellitus due to underlying condition with foot ulcer: Secondary | ICD-10-CM

## 2021-06-04 ENCOUNTER — Other Ambulatory Visit: Payer: Self-pay

## 2021-06-04 ENCOUNTER — Ambulatory Visit (INDEPENDENT_AMBULATORY_CARE_PROVIDER_SITE_OTHER): Payer: PPO | Admitting: Endocrinology

## 2021-06-04 ENCOUNTER — Encounter: Payer: Self-pay | Admitting: Endocrinology

## 2021-06-04 VITALS — BP 150/80 | HR 73 | Ht 72.0 in | Wt 271.8 lb

## 2021-06-04 DIAGNOSIS — Z794 Long term (current) use of insulin: Secondary | ICD-10-CM | POA: Diagnosis not present

## 2021-06-04 DIAGNOSIS — E0869 Diabetes mellitus due to underlying condition with other specified complication: Secondary | ICD-10-CM

## 2021-06-04 DIAGNOSIS — E1165 Type 2 diabetes mellitus with hyperglycemia: Secondary | ICD-10-CM

## 2021-06-04 LAB — POCT GLYCOSYLATED HEMOGLOBIN (HGB A1C): Hemoglobin A1C: 8.1 % — AB (ref 4.0–5.6)

## 2021-06-04 MED ORDER — NOVOLOG FLEXPEN 100 UNIT/ML ~~LOC~~ SOPN: PEN_INJECTOR | SUBCUTANEOUS | 11 refills | Status: DC

## 2021-06-04 NOTE — Progress Notes (Signed)
Subjective:    Patient ID: Martin Agee Sr., male    DOB: Jun 21, 1945, 76 y.o.   MRN: [id number]  HPI Pt returns for f/u of diabetes mellitus:  DM type: Insulin-requiring type 2.  Dx'ed: 9628 Complications: PN, foot ulcer, CRI, PAD, CAD, and DR.   Therapy: insulin since 2013.  DKA: never.   Severe hypoglycemia: never.  Pancreatitis: never.   Other: he takes multiple daily injections; pattern on CGM says he does not need basal insulin.  he declines weight loss surgery; he eats 2-3 meals per day.   Interval history: pt states he feels well in general.  He says he never misses the insulin.  He takes Novolog 12-25 units 3 times a day (just before each meal, but least at breakfast).  He has not yet started Ozempic, due to fear of side effects.  I reviewed continuous glucose monitor data.  Glucose varies from 120-330.  It is in general highest and 4PM and 12MN.  It is lowest at 7-8PM.  Otherwise, there is little trend throughout the day.  He has mild hypoglycemia approx QOD. This usually happens after breakfast.   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/08/2020   Tobacco abuse     Past Surgical History:  Procedure Laterality Date   CORONARY ARTERY BYPASS GRAFT N/A 10/01/2018   Procedure: CORONARY ARTERY BYPASS GRAFTING (CABG) TIMES  FOUR USING LEFT MAMMARY ARTERY AND RIGHT GREATER SAPHEANOUS VEIN HARVESTED ENDOSCOPICALLY;  Surgeon: Melrose Nakayama, MD;  Location: [facility];  Service: Open Heart Surgery;  Laterality: N/A;   HERNIA REPAIR     >10 years ago   IR ANGIOGRAM EXTREMITY LEFT  06/16/2020   IR RADIOLOGIST EVAL & MGMT  12/24/2016   IR RADIOLOGIST EVAL & MGMT  05/30/2020   IR  RADIOLOGIST EVAL & MGMT  07/13/2020   IR RADIOLOGIST EVAL & MGMT  09/06/2020   IR RADIOLOGIST EVAL & MGMT  12/26/2020   IR THORACENTESIS ASP PLEURAL SPACE W/IMG GUIDE  10/06/2018   IR US GUIDE VASC ACCESS RIGHT  06/16/2020   LEFT HEART CATH AND CORONARY ANGIOGRAPHY N/A 09/29/2018   Procedure: LEFT HEART CATH AND CORONARY ANGIOGRAPHY;  Surgeon: Troy Sine, MD;  Location: [facility];  Service: Cardiovascular;  Laterality: N/A;   open heart surfery     right shoulder surgery     around 2014   TEE WITHOUT CARDIOVERSION N/A 10/01/2018   Procedure: TRANSESOPHAGEAL ECHOCARDIOGRAM (TEE);  Surgeon: Melrose Nakayama, MD;  Location: [facility];  Service: Open Heart Surgery;  Laterality: N/A;    Social History   Socioeconomic History   Marital status: Married    Spouse name: Not on file   Number of children: Not on file   Years of education: Not on file   Highest education level: Not on file  Occupational History   Not on file  Tobacco Use   Smoking status: Former    Packs/day: 0.75    Years: 2.00    Pack years: 1.50    Types: Cigarettes    Start date: 2002    Quit date: 04/29/2002    Years  since quitting: 19.1   Smokeless tobacco: Never   Tobacco comments:    smoked for 10 years on and off  Substance and Sexual Activity   Alcohol use: Yes    Alcohol/week: 0.0 standard drinks    Comment: 6 martinis a year   Drug use: No   Sexual activity: Yes  Other Topics Concern   Not on file  Social History Narrative   Married. 3 kids. 7 grandkids. No greatgrandkids.       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     azelastine (ASTELIN) 0.1 % nasal spray Place 1 spray into both nostrils 2 (two) times daily. 30 mL 12   clopidogrel (PLAVIX) 75 MG tablet Take 1 tablet (75 mg total) by mouth daily. 90 tablet 3   Continuous Blood Gluc Receiver (FREESTYLE LIBRE 2 READER) DEVI See admin instructions.     Continuous Blood Gluc Sensor (FREESTYLE LIBRE 2 SENSOR) MISC 1 Device by Does not apply route every 14 (fourteen) days. 6 each 3   ezetimibe (ZETIA) 10 MG tablet Take 1 tablet (10 mg total) by mouth daily. 90 tablet 3   fexofenadine (ALLEGRA) 180 MG tablet Take 180 mg by mouth daily as needed for allergies or rhinitis.     fluticasone (FLONASE) 50 MCG/ACT nasal spray Place 2 sprays into both nostrils daily as needed for allergies or rhinitis.     Insulin Pen Needle (NOVOFINE) 32G X 6 MM MISC USE 5 TIMES DAILY 450 each 2   loratadine (CLARITIN) 10 MG tablet Take 10 mg by mouth as needed.     losartan (COZAAR) 50 MG tablet Take 1 tablet (50 mg total) by mouth in the morning and at bedtime. 180 tablet 3   metoprolol succinate (TOPROL-XL) 50 MG 24 hr tablet Take 1.5 tablets (75 mg total) by mouth at bedtime. Take with or immediately following a meal. 90 tablet 5   ONETOUCH VERIO test strip CHECK LEVELS 4 TIMES DAILY 100 strip 2   pantoprazole (PROTONIX) 20 MG tablet Take 1 tablet (20 mg total) by mouth daily. 90 tablet 1   Pitavastatin Calcium (LIVALO) 1 MG TABS Take 1 tablet (1 mg total) by mouth daily. 90 tablet 3   torsemide (DEMADEX) 20 MG tablet MUST MAKE APPOINTMENT FOR FUTURE REFILL LAST ATTEMPT. TAKE 2 TABLETS BY MOUTH EVERY DAY 30 tablet 0   vitamin C (ASCORBIC ACID) 250 MG tablet Take 250 mg by mouth daily.     [DISCONTINUED] DAPTOmycin (CUBICIN) 500 MG injection Inject into the vein. (Patient not taking: Reported on 10/22/2020)     [DISCONTINUED] DAPTOmycin 350 MG SOLR Inject into the  vein. (Patient not taking: No sig reported)     No current facility-administered medications on file prior to visit.    Allergies  Allergen Reactions   Simvastatin Diarrhea   Statins Other (See Comments)    achey joints   Xanax [Alprazolam] Anxiety    Family History  Problem Relation Age of Onset   Hypertension Mother    Heart disease Mother        CHF did not see doctor   Diabetes Maternal Grandmother    Diabetes Son     BP (!) 150/80    Pulse 73    Ht 6' (1.829 m)    Wt 271 lb 12.8 oz (123.3 kg)    SpO2 93%    BMI 36.86 kg/m    Review of Systems     Objective:   Physical Exam    Lab Results  Component Value Date   HGBA1C 8.1 (A) 06/04/2021      Assessment & Plan:  Insulin-requiring type 2 DM: uncontrolled.   Patient Instructions  Your blood pressure is high today.  Please see your primary care provider soon, to have it rechecked.   Please increase the Novolog to 3 times a day (just before each meal), 20-25-30 units check your blood sugar twice a day.  vary the time of day when you check, between before the 3 meals, and at bedtime.  also check if you have symptoms of your blood sugar being too high or too low.  please keep a record of the readings and bring it to your next appointment here (or you can bring the meter itself).  You can write it on any piece of paper.  please call us sooner if your blood sugar goes below 70, or if you have a lot of readings over 200.   Please come back for a follow-up appointment in 2 months.

## 2021-06-04 NOTE — Patient Instructions (Addendum)
Your blood pressure is high today.  Please see your primary care provider soon, to have it rechecked.   Please increase the Novolog to 3 times a day (just before each meal), 20-25-30 units check your blood sugar twice a day.  vary the time of day when you check, between before the 3 meals, and at bedtime.  also check if you have symptoms of your blood sugar being too high or too low.  please keep a record of the readings and bring it to your next appointment here (or you can bring the meter itself).  You can write it on any piece of paper.  please call us sooner if your blood sugar goes below 70, or if you have a lot of readings over 200.   Please come back for a follow-up appointment in 2 months.

## 2021-06-13 ENCOUNTER — Other Ambulatory Visit: Payer: PPO

## 2021-06-19 ENCOUNTER — Ambulatory Visit: Payer: PPO | Admitting: Cardiovascular Disease

## 2021-06-21 ENCOUNTER — Ambulatory Visit: Payer: PPO

## 2021-06-26 ENCOUNTER — Other Ambulatory Visit: Payer: PPO

## 2021-07-19 ENCOUNTER — Ambulatory Visit: Payer: PPO

## 2021-07-25 ENCOUNTER — Encounter: Payer: Self-pay | Admitting: Infectious Disease

## 2021-07-25 ENCOUNTER — Other Ambulatory Visit: Payer: Self-pay

## 2021-07-25 ENCOUNTER — Ambulatory Visit (INDEPENDENT_AMBULATORY_CARE_PROVIDER_SITE_OTHER): Payer: PPO | Admitting: Infectious Disease

## 2021-07-25 VITALS — BP 156/89 | HR 70 | Temp 98.3°F | Ht 72.0 in | Wt 277.0 lb

## 2021-07-25 DIAGNOSIS — M869 Osteomyelitis, unspecified: Secondary | ICD-10-CM | POA: Diagnosis not present

## 2021-07-25 DIAGNOSIS — I739 Peripheral vascular disease, unspecified: Secondary | ICD-10-CM

## 2021-07-25 DIAGNOSIS — N1832 Chronic kidney disease, stage 3b: Secondary | ICD-10-CM

## 2021-07-25 DIAGNOSIS — E1159 Type 2 diabetes mellitus with other circulatory complications: Secondary | ICD-10-CM | POA: Diagnosis not present

## 2021-07-25 DIAGNOSIS — I152 Hypertension secondary to endocrine disorders: Secondary | ICD-10-CM

## 2021-07-25 DIAGNOSIS — Z87891 Personal history of nicotine dependence: Secondary | ICD-10-CM | POA: Diagnosis not present

## 2021-07-25 NOTE — Progress Notes (Signed)
? ?Subjective:  ? ?Chief complaint: Follow-up for diabetic foot infection ? ? ? Patient ID: Martin Agee Sr., male    DOB: 04-07-1946, 76 y.o.   MRN: [id number] ? ?HPI ? ?[name] is a 76 year-old Caucasian man with a past medical history significant for coronary artery disease status post coronary bypass grafting, diabetes mellitus peripheral artery disease who has been struggling with an ulcer in his foot for several years now. ?  ?  ?Unfortunately his developed osteomyelitis.  He was referred to Dr. Sharol Given who offered him surgery but the patient was not ready to do this yet. ?  ?There was  also concern about his ability to heal any surgery in his foot due to his peripheral vascular disease. ?  ?He may in fact require a proximal amputation such as a below the knee amputation for cure. ?  ?He has been a very difficult situation socially and that he is caring for his wife and is the sole caregiver.  His wife has metastatic breast cancer that metastasized to bone. ? ? ?He was not ready to have any type of surgery including surgery on the foot at this point in time but wants to see his wife improving first over more resolution on not from prior to allowing himself to be hospitalized for a surgical intervention. ?  ?He has been followed closely by the wound care clinic and Dr. Dellia Nims who gave him a prescription for Zyvox when the patient developed erythema that went up his leg and swelling. ?  ?He responded well to Zyvox he also had had DVT excluded through duplex. ?  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  ?The erythema in his leg had down considerably since he is received his course of Zyvox ? ?I observed him off antibiotics but there was a great desire his part on the prior Dr. Dellia Nims to see what systemic antibiotics would do.  Therefore we started IV daptomycin with a goal of  getting through 8 weeks of therapy ? ?He then had revascularization and initiated daptomycin that he had dramatic improvement in his wound which is now closed.  He said it improved within a matter of 2 weeks. ? ? ?His wound indeed had healed up quite nicely and continues to look dramatically improved. ? ?I have continued to have anxiety about the underlying osteomyelitis. ? ?Unfortunately there are not really many good oral options.  I did not want to hazard clindamycin and potentially cause C. difficile colitis.  He has high potassium at baseline and chronic kidney disease making Bactrim dangerous and undesirable antibiotic as well. ? ?We consisered that he might build to get pharmaceutical assistance for tedezolid or possibly omadacycline being a Medicare patient may make this more complicated.  We could also investigate this another option would be rounds of long-acting oritavancin or dalbavancin. I did not think though that these really viable long-term options. ? ?Because of this we decided to observe him off of antibiotics and he seems to have been doing quite well. ? ?He has had no clinical evidence of recurrence of his osteomyelitis.  He has been seen by interventional radiology to assess his blood supply to his last visit with [place]. ? ?His foot continues to appear well.  There is no drainage from the wound.  He does not experience pain at that site but that is confounded by  his neuropathy. ? ?His inflammatory markers have trended down. ? ?I last saw Martin [name] in August 2022 and he is continue to remain off antibiotics without evidence of recurrent infection. ? ?Wound is completely healed over and there is a hard to discern scab present. ? ? Neilson continues to well. ? ?He has no evidence of recurrence clinically at this point in time of his osteomyelitis.  His wife underwent total hip arthroplasty due to bone-on-bone changes. ? ?She remains on chemotherapy for her metastatic breast  cancer. ? ? ? ? ? ? ? ?. ? ?Past Medical History:  ?Diagnosis Date  ? A-fib ([place])   ? CKD (chronic kidney disease) stage 3, GFR 30-59 ml/min ([place]) 08/29/2020  ? Diabetes mellitus   ? Heart attack ([place]) 10/01/2018  ? Pt had open heart surgery  ? Hyperkalemia 08/29/2020  ? Hypertension   ? Osteomyelitis of fifth toe of left foot ([place]) 05/08/2020  ? Simple chronic bronchitis ([place]) 05/08/2020  ? Tobacco abuse   ? ? ?Past Surgical History:  ?Procedure Laterality Date  ? CORONARY ARTERY BYPASS GRAFT N/A 10/01/2018  ? Procedure: CORONARY ARTERY BYPASS GRAFTING (CABG) TIMES  FOUR USING LEFT MAMMARY ARTERY AND RIGHT GREATER SAPHEANOUS VEIN HARVESTED ENDOSCOPICALLY;  Surgeon: Melrose Nakayama, MD;  Location: [facility];  Service: Open Heart Surgery;  Laterality: N/A;  ? HERNIA REPAIR    ? >10 years ago  ? IR ANGIOGRAM EXTREMITY LEFT  06/16/2020  ? IR RADIOLOGIST EVAL & MGMT  12/24/2016  ? IR RADIOLOGIST EVAL & MGMT  05/30/2020  ? IR RADIOLOGIST EVAL & MGMT  07/13/2020  ? IR RADIOLOGIST EVAL & MGMT  09/06/2020  ? IR RADIOLOGIST EVAL & MGMT  12/26/2020  ? IR THORACENTESIS ASP PLEURAL SPACE W/IMG GUIDE  10/06/2018  ? IR US GUIDE VASC ACCESS RIGHT  06/16/2020  ? LEFT HEART CATH AND CORONARY ANGIOGRAPHY N/A 09/29/2018  ? Procedure: LEFT HEART CATH AND CORONARY ANGIOGRAPHY;  Surgeon: Troy Sine, MD;  Location: [facility];  Service: Cardiovascular;  Laterality: N/A;  ? open heart surfery    ? right shoulder surgery    ? around 2014  ? TEE WITHOUT CARDIOVERSION N/A 10/01/2018  ? Procedure: TRANSESOPHAGEAL ECHOCARDIOGRAM (TEE);  Surgeon: Melrose Nakayama, MD;  Location: [facility];  Service: Open Heart Surgery;  Laterality: N/A;  ? ? ?Family History  ?Problem Relation Age of Onset  ? Hypertension Mother   ? Heart disease Mother   ?     CHF did not see doctor  ? Diabetes Maternal Grandmother   ? Diabetes Son   ? ? ?  ?Social History  ? ?Socioeconomic History  ? Marital status: Married  ?  Spouse name: Not on file  ? Number of children: Not on  file  ? Years of education: Not on file  ? Highest education level: Not on file  ?Occupational History  ? Not on file  ?Tobacco Use  ? Smoking status: Former  ?  Packs/day: 0.75  ?  Years: 2.00  ?  Pack years: 1.50  ?  Types: Cigarettes  ?  Start date: 2002  ?  Quit date: 04/29/2002  ?  Years since quitting: 19.2  ? Smokeless tobacco: Never  ? Tobacco comments:  ?  smoked for 10 years on and off  ?Substance and Sexual Activity  ? Alcohol use: Yes  ?  Alcohol/week: 0.0 standard drinks  ?  Comment: 6 martinis a year  ? Drug use: No  ?  Sexual activity: Yes  ?Other Topics Concern  ? Not on file  ?Social History Narrative  ? Married. 3 kids. 7 grandkids. No greatgrandkids.   ?   ? 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   ? Hobbies: golf previously, yardwork  ? ?Social Determinants of Health  ? ?Financial Resource Strain: Not on file  ?Food Insecurity: Not on file  ?Transportation Needs: Not on file  ?Physical Activity: Not on file  ?Stress: Not on file  ?Social Connections: Not on file  ? ? ?Allergies  ?Allergen Reactions  ? Simvastatin Diarrhea  ? Statins Other (See Comments)  ?  achey joints  ? Xanax [Alprazolam] Anxiety  ? ? ? ?Current Outpatient Medications:  ?  aspirin 81 MG EC tablet, Take 1 tablet (81 mg total) by mouth daily., Disp: , Rfl:  ?  azelastine (ASTELIN) 0.1 % nasal spray, Place 1 spray into both nostrils 2 (two) times daily., Disp: 30 mL, Rfl: 12 ?  clopidogrel (PLAVIX) 75 MG tablet, Take 1 tablet (75 mg total) by mouth daily., Disp: 90 tablet, Rfl: 3 ?  Continuous Blood Gluc Receiver (FREESTYLE LIBRE 2 READER) DEVI, See admin instructions., Disp: , Rfl:  ?  Continuous Blood Gluc Sensor (FREESTYLE LIBRE 2 SENSOR) MISC, 1 Device by Does not apply route every 14 (fourteen) days., Disp: 6 each, Rfl: 3 ?  ezetimibe (ZETIA) 10 MG tablet, Take 1 tablet (10 mg total) by mouth daily., Disp: 90 tablet, Rfl: 3 ?  fexofenadine (ALLEGRA) 180 MG tablet, Take 180 mg by mouth daily as  needed for allergies or rhinitis., Disp: , Rfl:  ?  fluticasone (FLONASE) 50 MCG/ACT nasal spray, Place 2 sprays into both nostrils daily as needed for allergies or rhinitis., Disp: , Rfl:  ?  insulin aspart (NOVOLOG FLEXPEN) 100 UNIT/ML

## 2021-07-25 NOTE — Progress Notes (Signed)
? ?Phone [phone] ?In person visit ?  ?Subjective:  ? ?Martin [name] [facility] Sr. is a 76 y.o. year old very pleasant male patient who presents for/with See problem oriented charting ?Chief Complaint  ?Patient presents with  ? Follow-up  ? Hypertension  ? Diabetes  ? ? ?This visit occurred during the SARS-CoV-2 public health emergency.  Safety protocols were in place, including screening questions prior to the visit, additional usage of staff PPE, and extensive cleaning of exam room while observing appropriate contact time as indicated for disinfecting solutions.  ? ?Past Medical History-  ?Patient Active Problem List  ? Diagnosis Date Noted  ? 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  Priority: High  ? Postoperative atrial fibrillation ([place]) 10/24/2018  ?  Priority: High  ? Coronary artery disease s/p CABG 10/01/2018 after STEMI   ?  Priority: High  ? Undiagnosed cardiac murmurs 08/15/2015  ?  Priority: High  ? Diabetes ([place]) 12/14/2009  ?  Priority: High  ? CKD (chronic kidney disease) stage 3, GFR 30-59 ml/min (HCC) 08/29/2020  ?  Priority: Medium   ? 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  Priority: Medium   ? PAD (peripheral artery disease) ([place]) 08/20/2017  ?  Priority: Medium   ? Partial tear of right Achilles tendon 02/12/2017  ?  Priority: Medium   ? Aortic atherosclerosis ([place]) 10/07/2016  ?  Priority: Medium   ? History of skin cancer 08/15/2015  ?  Priority: Medium   ? Diabetic retinopathy ([place]) 02/21/2015  ?  Priority: Medium   ? Hyperlipidemia associated with type 2 diabetes mellitus ([place]) 08/24/2014  ?  Priority: Medium   ? Diabetic polyneuropathy ([place]) 04/11/2009  ?  Priority: Medium   ? Essential hypertension 02/03/2007  ?  Priority: Medium   ? Acute blood loss as cause of postoperative anemia 10/24/2018  ?  Priority: Low  ? BPH (benign prostatic hyperplasia) 10/24/2018  ?  Priority: Low  ? History of depression 10/24/2018  ?  Priority: Low  ? S/P CABG x 4 10/01/2018  ?  Priority:  Low  ? Onychomycosis of toenail 02/17/2018  ?  Priority: Low  ? Allergic rhinitis 08/20/2017  ?  Priority: Low  ? C. difficile colitis 09/25/2016  ?  Priority: Low  ? History of aspiration pneumonia 09/25/2016  ?  Priority: Low  ? Former smoker 08/24/2014  ?  Priority: Low  ? Ingrowing toenail 07/19/2014  ?  Priority: Low  ? BACK PAIN, LUMBAR, WITH RADICULOPATHY 08/30/2008  ?  Priority: Low  ? Diabetic foot ulcer with osteomyelitis ([place]) 08/22/2021  ? Simple chronic bronchitis ([place]) 05/08/2020  ? ? ?Medications- reviewed and updated ?Current Outpatient Medications  ?Medication Sig Dispense Refill  ? aspirin 81 MG EC tablet Take 1 tablet (81 mg total) by mouth daily.    ? azelastine (ASTELIN) 0.1 % nasal spray Place 1 spray into both nostrils 2 (two) times daily. 30 mL 12  ? clopidogrel (PLAVIX) 75 MG tablet Take 1 tablet (75 mg total) by mouth daily. 90 tablet 3  ? Continuous Blood Gluc Receiver (FREESTYLE LIBRE 2 READER) DEVI See admin instructions.    ? Continuous Blood Gluc Sensor (FREESTYLE LIBRE 2 SENSOR) MISC 1 Device by Does not apply route every 14 (fourteen) days. 6 each 3  ? ezetimibe (ZETIA) 10 MG tablet Take 1 tablet (10 mg total) by mouth daily. 90 tablet 3  ? fexofenadine (ALLEGRA) 180 MG tablet Take 180 mg by  mouth daily as needed for allergies or rhinitis.    ? fluticasone (FLONASE) 50 MCG/ACT nasal spray Place 2 sprays into both nostrils daily as needed for allergies or rhinitis.    ? insulin aspart (NOVOLOG FLEXPEN) 100 UNIT/ML FlexPen 3 times a day (just before each meal), 30-35-35 units, and pen needles 3/day 120 mL 1  ? Insulin Pen Needle (NOVOFINE) 32G X 6 MM MISC USE 5 TIMES DAILY 450 each 2  ? loratadine (CLARITIN) 10 MG tablet Take 10 mg by mouth as needed.    ? losartan (COZAAR) 50 MG tablet Take 1 tablet (50 mg total) by mouth in the morning and at bedtime. 180 tablet 3  ? ONETOUCH VERIO test strip CHECK LEVELS 4 TIMES DAILY 100 strip 2  ? pantoprazole (PROTONIX) 20 MG tablet Take 1 tablet  (20 mg total) by mouth daily. 90 tablet 1  ? Pitavastatin Calcium (LIVALO) 1 MG TABS Take 1 tablet (1 mg total) by mouth daily. 90 tablet 3  ? torsemide (DEMADEX) 20 MG tablet MUST MAKE APPOINTMENT FOR FUTURE REFILL LAST ATTEMPT. TAKE 2 TABLETS BY MOUTH EVERY DAY 30 tablet 0  ? vitamin C (ASCORBIC ACID) 250 MG tablet Take 250 mg by mouth daily.    ? metoprolol succinate (TOPROL-XL) 50 MG 24 hr tablet Take 1.5 tablets (75 mg total) by mouth at bedtime. Take with or immediately following a meal. 90 tablet 5  ? ?No current facility-administered medications for this visit.  ? ?  ?Objective:  ?BP 120/70   Pulse 77   Temp 97.6 ?F (36.4 ?C)   Ht 6' (1.829 m)   Wt 280 lb 9.6 oz (127.3 kg)   SpO2 95%   BMI 38.06 kg/m?  ?Gen: NAD, resting comfortably ?CV: RRR no murmurs rubs or gallops ?Lungs: CTAB no crackles, wheeze, rhonchi- previously heard some crackles in lung bases- not noted today ?Ext: 1+ edema ?Skin: warm, dry ?  ? ?Assessment and Plan  ? ?#Chronic cough since November 2021 ?-Normal chest x-ray 06/30/2020 ?-Improvement with treatment for sinusitis in the past with Augmentin  ?-I have considered adding Astelin-previously had taking Claritin but felt lightheaded in the day and stopped, Mucinex, Flonase ?-Have considered adding reflux medication  ?-CT of the chest was ordered at last visit-appears this was scheduled twice in February then canceled and I cannot obtain details from notes- he reports was having to care for wife but will call to schedule-patient did have some crackles at right lung base at last visit.  Patient was worried about aneurysm and we had discussed that this would be ruled out with CT as well.  No exertional pain-we suspect a musculoskeletal cause. He has # to call back and schedule ?- We also referred him to pulmonary-I do not see that patient has been scheduled - he was caring for wife- has # to call and schedule ?- overall congestion stable or slightly improved- he is less worried today  about significant underling cause (responding to allergy meds better now- claritin and flonase) but encouraged to complete workup.  ? ?#Osteomyelitis of fifth toe left foot- was on chronic daptomycin through infectious disease-most recently seen 07/25/2021-plan was to observe off antibiotics-patient had Zyvox (calls his "fire extinguisher") on hand if needed and plan was for 69-monthfollow-up  ? ?# Diabetes-managed by endocrinology Dr. ELoanne Drilling?-History of diabetic retinopathy - getting injections ?-History of diabetic polyneuropathy which contributed to diabetic wound/ulcer/osteomyelitis ?S: Medication: NovoLog now up to 30-35 units TID so he can come off  of lantus- had issue where sugar crashed on lantus years ago when had c. diff ?Lab Results  ?Component Value Date  ? HGBA1C 8.3 (A) 08/20/2021  ? HGBA1C 8.1 (A) 06/04/2021  ? HGBA1C 9.1 (A) 12/18/2020  ? A/P: mild poor control- continue current follow up- would prefer for him to see new endocrinologist as Dr. Loanne Drilling is leaving progress ? ?#CAD  ?#Peripheral arterial disease- history of angioplasty in February 2022. Statin myalgias #hyperlipidemia-with LDL goal under 70 particular with aortic atherosclerosis  ?S: Medication:Aspirin 81 mg, Plavix 75 mg- plans to restart- sounds like pharmacy was out, Zetia 10 mg, Livalo 1 mg ?- no chest pain or shortness of breath. No claudication ?Lab Results  ?Component Value Date  ? CHOL 155 08/16/2020  ? HDL 40.70 08/16/2020  ? [place] 94 08/16/2020  ? LDLDIRECT 173.0 07/01/2018  ? TRIG 101.0 08/16/2020  ? CHOLHDL 4 08/16/2020  ?A/P: CAD and PAD asymptomatic. Continue current medicine ?- for lipids- LDL above ideal goal by statin intolerant- this is best dosing we have been able to get him on for balance of side effects and benefit ?-offered to check lipids - he will come back fasting in may ? ?#hypertension ?S: medication: Losartan 50 mg, metoprolol 75 mg in the evening XR, torsemide 20 mg- not needing this ?BP Readings from  Last 3 Encounters:  ?08/22/21 120/70  ?08/20/21 130/80  ?07/25/21 (!) 156/89  ?A/P: Controlled. Continue current medications.  ? ?Recommended follow up: Return for next already scheduled visit or sooner if neede

## 2021-07-26 LAB — CBC WITH DIFFERENTIAL/PLATELET
Absolute Monocytes: 664 cells/uL (ref 200–950)
Basophils Absolute: 24 cells/uL (ref 0–200)
Basophils Relative: 0.3 %
Eosinophils Absolute: 237 cells/uL (ref 15–500)
Eosinophils Relative: 3 %
HCT: 40.5 % (ref 38.5–50.0)
Hemoglobin: 13.4 g/dL (ref 13.2–17.1)
Lymphs Abs: 2054 cells/uL (ref 850–3900)
MCH: 30.5 pg (ref 27.0–33.0)
MCHC: 33.1 g/dL (ref 32.0–36.0)
MCV: 92 fL (ref 80.0–100.0)
MPV: 10.1 fL (ref 7.5–12.5)
Monocytes Relative: 8.4 %
Neutro Abs: 4922 cells/uL (ref 1500–7800)
Neutrophils Relative %: 62.3 %
Platelets: 259 10*3/uL (ref 140–400)
RBC: 4.4 10*6/uL (ref 4.20–5.80)
RDW: 12.7 % (ref 11.0–15.0)
Total Lymphocyte: 26 %
WBC: 7.9 10*3/uL (ref 3.8–10.8)

## 2021-07-26 LAB — COMPLETE METABOLIC PANEL WITH GFR
AG Ratio: 1.5 (calc) (ref 1.0–2.5)
ALT: 13 U/L (ref 9–46)
AST: 12 U/L (ref 10–35)
Albumin: 4.5 g/dL (ref 3.6–5.1)
Alkaline phosphatase (APISO): 53 U/L (ref 35–144)
BUN/Creatinine Ratio: 15 (calc) (ref 6–22)
BUN: 22 mg/dL (ref 7–25)
CO2: 30 mmol/L (ref 20–32)
Calcium: 9.3 mg/dL (ref 8.6–10.3)
Chloride: 100 mmol/L (ref 98–110)
Creat: 1.44 mg/dL — ABNORMAL HIGH (ref 0.70–1.28)
Globulin: 3 g/dL (calc) (ref 1.9–3.7)
Glucose, Bld: 166 mg/dL — ABNORMAL HIGH (ref 65–99)
Potassium: 4.7 mmol/L (ref 3.5–5.3)
Sodium: 137 mmol/L (ref 135–146)
Total Bilirubin: 1.1 mg/dL (ref 0.2–1.2)
Total Protein: 7.5 g/dL (ref 6.1–8.1)
eGFR: 50 mL/min/{1.73_m2} — ABNORMAL LOW (ref >=60)

## 2021-07-26 LAB — SEDIMENTATION RATE: Sed Rate: 17 mm/h (ref 0–20)

## 2021-07-26 LAB — C-REACTIVE PROTEIN: CRP: 3.7 mg/L (ref <8.0)

## 2021-08-02 ENCOUNTER — Ambulatory Visit: Payer: PPO

## 2021-08-14 DIAGNOSIS — H35013 Changes in retinal vascular appearance, bilateral: Secondary | ICD-10-CM | POA: Diagnosis not present

## 2021-08-14 DIAGNOSIS — H40023 Open angle with borderline findings, high risk, bilateral: Secondary | ICD-10-CM | POA: Diagnosis not present

## 2021-08-14 DIAGNOSIS — E113213 Type 2 diabetes mellitus with mild nonproliferative diabetic retinopathy with macular edema, bilateral: Secondary | ICD-10-CM | POA: Diagnosis not present

## 2021-08-14 DIAGNOSIS — D3131 Benign neoplasm of right choroid: Secondary | ICD-10-CM | POA: Diagnosis not present

## 2021-08-15 ENCOUNTER — Ambulatory Visit: Payer: PPO | Admitting: Cardiovascular Disease

## 2021-08-20 ENCOUNTER — Ambulatory Visit (INDEPENDENT_AMBULATORY_CARE_PROVIDER_SITE_OTHER): Payer: PPO | Admitting: Endocrinology

## 2021-08-20 VITALS — BP 130/80 | HR 77 | Ht 72.0 in | Wt 275.4 lb

## 2021-08-20 DIAGNOSIS — Z794 Long term (current) use of insulin: Secondary | ICD-10-CM | POA: Diagnosis not present

## 2021-08-20 DIAGNOSIS — E1165 Type 2 diabetes mellitus with hyperglycemia: Secondary | ICD-10-CM | POA: Diagnosis not present

## 2021-08-20 LAB — POCT GLYCOSYLATED HEMOGLOBIN (HGB A1C): Hemoglobin A1C: 8.3 % — AB (ref 4.0–5.6)

## 2021-08-20 MED ORDER — NOVOLOG FLEXPEN 100 UNIT/ML ~~LOC~~ SOPN: PEN_INJECTOR | SUBCUTANEOUS | 1 refills | Status: DC

## 2021-08-20 NOTE — Progress Notes (Signed)
? ?Subjective:  ? ? Patient ID: Martin Agee Sr., male    DOB: 03-Jan-1946, 76 y.o.   MRN: [id number] ? ?HPI ?Pt returns for f/u of diabetes mellitus:  ?DM type: Insulin-requiring type 2.  ?Dx'ed: 2004 ?Complications: PN, foot ulcer, CRI, PAD, CAD, and DR.   ?Therapy: insulin since 2013.  ?DKA: never.   ?Severe hypoglycemia: never.   ?Pancreatitis: never.   ?Other: he takes multiple daily injections; pattern on CGM says he does not need basal insulin.  he declines weight loss surgery; he eats 2-3 meals per day: he declines Ozempic ?Interval history: pt states he feels well in general. I reviewed continuous glucose monitor data.  Glucose varies from 100-330.  It is in general highest and 9PM and less high at 2PM-6PM.  It is lowest at 8AM.  It generally increases throughout the day, and it decreases overnight.  He has mild hypoglycemia approx QOD. This usually happens after breakfast.  He sometimes takes Lantus, due to elev cbg's.   ?Past Medical History:  ?Diagnosis Date  ? A-fib ([place])   ? CKD (chronic kidney disease) stage 3, GFR 30-59 ml/min ([place]) 08/29/2020  ? Diabetes mellitus   ? Heart attack ([place]) 10/01/2018  ? Pt had open heart surgery  ? Hyperkalemia 08/29/2020  ? Hypertension   ? Osteomyelitis of fifth toe of left foot ([place]) 05/08/2020  ? Simple chronic bronchitis ([place]) 05/08/2020  ? Tobacco abuse   ? ? ?Past Surgical History:  ?Procedure Laterality Date  ? CORONARY ARTERY BYPASS GRAFT N/A 10/01/2018  ? Procedure: CORONARY ARTERY BYPASS GRAFTING (CABG) TIMES  FOUR USING LEFT MAMMARY ARTERY AND RIGHT GREATER SAPHEANOUS VEIN HARVESTED ENDOSCOPICALLY;  Surgeon: Melrose Nakayama, MD;  Location: [facility];  Service: Open Heart Surgery;  Laterality: N/A;  ? HERNIA REPAIR    ? >10 years ago  ? IR ANGIOGRAM EXTREMITY LEFT  06/16/2020  ? IR RADIOLOGIST EVAL & MGMT  12/24/2016  ? IR RADIOLOGIST EVAL & MGMT  05/30/2020  ? IR RADIOLOGIST EVAL & MGMT  07/13/2020  ? IR RADIOLOGIST EVAL & MGMT  09/06/2020  ? IR RADIOLOGIST EVAL & MGMT   12/26/2020  ? IR THORACENTESIS ASP PLEURAL SPACE W/IMG GUIDE  10/06/2018  ? IR US GUIDE VASC ACCESS RIGHT  06/16/2020  ? LEFT HEART CATH AND CORONARY ANGIOGRAPHY N/A 09/29/2018  ? Procedure: LEFT HEART CATH AND CORONARY ANGIOGRAPHY;  Surgeon: Troy Sine, MD;  Location: [facility];  Service: Cardiovascular;  Laterality: N/A;  ? open heart surfery    ? right shoulder surgery    ? around 2014  ? TEE WITHOUT CARDIOVERSION N/A 10/01/2018  ? Procedure: TRANSESOPHAGEAL ECHOCARDIOGRAM (TEE);  Surgeon: Melrose Nakayama, MD;  Location: [facility];  Service: Open Heart Surgery;  Laterality: N/A;  ? ? ?Social History  ? ?Socioeconomic History  ? Marital status: Married  ?  Spouse name: Not on file  ? Number of children: Not on file  ? Years of education: Not on file  ? Highest education level: Not on file  ?Occupational History  ? Not on file  ?Tobacco Use  ? Smoking status: Former  ?  Packs/day: 0.75  ?  Years: 2.00  ?  Pack years: 1.50  ?  Types: Cigarettes  ?  Start date: 2002  ?  Quit date: 04/29/2002  ?  Years since quitting: 19.3  ? Smokeless tobacco: Never  ? Tobacco comments:  ?  smoked for 10 years on and off  ?Substance  and Sexual Activity  ? Alcohol use: Yes  ?  Comment: occasionally  ? Drug use: No  ? Sexual activity: Yes  ?Other Topics Concern  ? Not on file  ?Social History Narrative  ? Married. 3 kids. 7 grandkids. No greatgrandkids.   ?   ? 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   ? Hobbies: golf previously, yardwork  ? ?Social Determinants of Health  ? ?Financial Resource Strain: Not on file  ?Food Insecurity: Not on file  ?Transportation Needs: Not on file  ?Physical Activity: Not on file  ?Stress: Not on file  ?Social Connections: Not on file  ?Intimate Partner Violence: Not on file  ? ? ?Current Outpatient Medications on File Prior to Visit  ?Medication Sig Dispense Refill  ? aspirin 81 MG EC tablet Take 1 tablet (81 mg total) by mouth daily.    ? azelastine (ASTELIN) 0.1  % nasal spray Place 1 spray into both nostrils 2 (two) times daily. 30 mL 12  ? clopidogrel (PLAVIX) 75 MG tablet Take 1 tablet (75 mg total) by mouth daily. 90 tablet 3  ? Continuous Blood Gluc Receiver (FREESTYLE LIBRE 2 READER) DEVI See admin instructions.    ? Continuous Blood Gluc Sensor (FREESTYLE LIBRE 2 SENSOR) MISC 1 Device by Does not apply route every 14 (fourteen) days. 6 each 3  ? ezetimibe (ZETIA) 10 MG tablet Take 1 tablet (10 mg total) by mouth daily. 90 tablet 3  ? fexofenadine (ALLEGRA) 180 MG tablet Take 180 mg by mouth daily as needed for allergies or rhinitis.    ? fluticasone (FLONASE) 50 MCG/ACT nasal spray Place 2 sprays into both nostrils daily as needed for allergies or rhinitis.    ? Insulin Pen Needle (NOVOFINE) 32G X 6 MM MISC USE 5 TIMES DAILY 450 each 2  ? loratadine (CLARITIN) 10 MG tablet Take 10 mg by mouth as needed.    ? losartan (COZAAR) 50 MG tablet Take 1 tablet (50 mg total) by mouth in the morning and at bedtime. 180 tablet 3  ? ONETOUCH VERIO test strip CHECK LEVELS 4 TIMES DAILY 100 strip 2  ? pantoprazole (PROTONIX) 20 MG tablet Take 1 tablet (20 mg total) by mouth daily. 90 tablet 1  ? Pitavastatin Calcium (LIVALO) 1 MG TABS Take 1 tablet (1 mg total) by mouth daily. 90 tablet 3  ? torsemide (DEMADEX) 20 MG tablet MUST MAKE APPOINTMENT FOR FUTURE REFILL LAST ATTEMPT. TAKE 2 TABLETS BY MOUTH EVERY DAY 30 tablet 0  ? vitamin C (ASCORBIC ACID) 250 MG tablet Take 250 mg by mouth daily.    ? metoprolol succinate (TOPROL-XL) 50 MG 24 hr tablet Take 1.5 tablets (75 mg total) by mouth at bedtime. Take with or immediately following a meal. 90 tablet 5  ? [DISCONTINUED] DAPTOmycin (CUBICIN) 500 MG injection Inject into the vein. (Patient not taking: Reported on 10/22/2020)    ? [DISCONTINUED] DAPTOmycin 350 MG SOLR Inject into the vein. (Patient not taking: No sig reported)    ? ?No current facility-administered medications on file prior to visit.  ? ? ?Allergies  ?Allergen  Reactions  ? Simvastatin Diarrhea  ? Statins Other (See Comments)  ?  achey joints  ? Xanax [Alprazolam] Anxiety  ? ? ?Family History  ?Problem Relation Age of Onset  ? Hypertension Mother   ? Heart disease Mother   ?     CHF did not see doctor  ? Diabetes Maternal Grandmother   ?  Diabetes Son   ? ? ?BP 130/80 (BP Location: Left Arm, Patient Position: Sitting, Cuff Size: Normal)   Pulse 77   Ht 6' (1.829 m)   Wt 275 lb 6.4 oz (124.9 kg)   SpO2 97%   BMI 37.35 kg/m?  ? ? ?ROS: ?He denies hypoglycemia.  ?   ?Objective:  ? Physical Exam ?VITAL SIGNS:  See vs page ?GENERAL: no distress ? ? ? ?Lab Results  ?Component Value Date  ? HGBA1C 8.3 (A) 08/20/2021  ? ?   ?Assessment & Plan:  ?Insulin-requiring type 2 DM: uncontrolled.   ? ?Patient Instructions  ?Please increase the Novolog to 3 times a day (just before each meal), 30-35-35 units.  This is to avoid the need for Lantus.   ?check your blood sugar twice a day.  vary the time of day when you check, between before the 3 meals, and at bedtime.  also check if you have symptoms of your blood sugar being too high or too low.  please keep a record of the readings and bring it to your next appointment here (or you can bring the meter itself).  You can write it on any piece of paper.  please call us sooner if your blood sugar goes below 70, or if you have a lot of readings over 200.   ?Please come back for a follow-up appointment in 2 months.   ? ? ?

## 2021-08-20 NOTE — Patient Instructions (Addendum)
Please increase the Novolog to 3 times a day (just before each meal), 30-35-35 units.  This is to avoid the need for Lantus.   ?check your blood sugar twice a day.  vary the time of day when you check, between before the 3 meals, and at bedtime.  also check if you have symptoms of your blood sugar being too high or too low.  please keep a record of the readings and bring it to your next appointment here (or you can bring the meter itself).  You can write it on any piece of paper.  please call us sooner if your blood sugar goes below 70, or if you have a lot of readings over 200.   ?Please come back for a follow-up appointment in 2 months.   ?

## 2021-08-21 ENCOUNTER — Ambulatory Visit: Payer: PPO

## 2021-08-22 ENCOUNTER — Encounter: Payer: Self-pay | Admitting: Family Medicine

## 2021-08-22 ENCOUNTER — Ambulatory Visit (INDEPENDENT_AMBULATORY_CARE_PROVIDER_SITE_OTHER): Payer: PPO | Admitting: Family Medicine

## 2021-08-22 VITALS — BP 120/70 | HR 77 | Temp 97.6°F | Ht 72.0 in | Wt 280.6 lb

## 2021-08-22 DIAGNOSIS — L97509 Non-pressure chronic ulcer of other part of unspecified foot with unspecified severity: Secondary | ICD-10-CM | POA: Diagnosis not present

## 2021-08-22 DIAGNOSIS — I739 Peripheral vascular disease, unspecified: Secondary | ICD-10-CM | POA: Diagnosis not present

## 2021-08-22 DIAGNOSIS — E785 Hyperlipidemia, unspecified: Secondary | ICD-10-CM | POA: Diagnosis not present

## 2021-08-22 DIAGNOSIS — M869 Osteomyelitis, unspecified: Secondary | ICD-10-CM

## 2021-08-22 DIAGNOSIS — I2510 Atherosclerotic heart disease of native coronary artery without angina pectoris: Secondary | ICD-10-CM

## 2021-08-22 DIAGNOSIS — E11621 Type 2 diabetes mellitus with foot ulcer: Secondary | ICD-10-CM | POA: Insufficient documentation

## 2021-08-22 DIAGNOSIS — Z794 Long term (current) use of insulin: Secondary | ICD-10-CM | POA: Diagnosis not present

## 2021-08-22 DIAGNOSIS — E1169 Type 2 diabetes mellitus with other specified complication: Secondary | ICD-10-CM | POA: Diagnosis not present

## 2021-08-22 DIAGNOSIS — I10 Essential (primary) hypertension: Secondary | ICD-10-CM | POA: Diagnosis not present

## 2021-08-22 DIAGNOSIS — E1151 Type 2 diabetes mellitus with diabetic peripheral angiopathy without gangrene: Secondary | ICD-10-CM | POA: Diagnosis not present

## 2021-08-22 NOTE — Patient Instructions (Addendum)
Call to schedule CT follow up and pulmonary follow up ? ?Glad you are doing well overall ? ?Schedule fasting lipid panel sometime in may ? ?Recommended follow up: Return for next already scheduled visit or sooner if needed. ?

## 2021-09-25 ENCOUNTER — Telehealth: Payer: Self-pay | Admitting: Family Medicine

## 2021-09-25 ENCOUNTER — Other Ambulatory Visit (INDEPENDENT_AMBULATORY_CARE_PROVIDER_SITE_OTHER): Payer: PPO

## 2021-09-25 DIAGNOSIS — E785 Hyperlipidemia, unspecified: Secondary | ICD-10-CM | POA: Diagnosis not present

## 2021-09-25 DIAGNOSIS — E1169 Type 2 diabetes mellitus with other specified complication: Secondary | ICD-10-CM | POA: Diagnosis not present

## 2021-09-25 LAB — LIPID PANEL
Cholesterol: 156 mg/dL (ref 0–200)
HDL: 37.8 mg/dL — ABNORMAL LOW (ref >39.00)
LDL Cholesterol: 94 mg/dL (ref 0–99)
NonHDL: 118.04
Total CHOL/HDL Ratio: 4
Triglycerides: 120 mg/dL (ref 0.0–149.0)
VLDL: 24 mg/dL (ref 0.0–40.0)

## 2021-09-25 NOTE — Telephone Encounter (Signed)
Yes, ok to schedule

## 2021-09-25 NOTE — Telephone Encounter (Signed)
Pt states he was expecting to be able to come into [facility] for a lipid panel, FO is unsure if the 08/22/21 request is available for scheduling.  Please advise then route back to [facility] pool for scheduling.  Explained to pt that lab appointments are available until 11:30am and after 2:00 until 4. Pt stated understanding.

## 2021-10-22 ENCOUNTER — Ambulatory Visit: Payer: PPO | Admitting: Infectious Disease

## 2021-10-23 ENCOUNTER — Ambulatory Visit: Payer: PPO

## 2021-10-23 DIAGNOSIS — E113213 Type 2 diabetes mellitus with mild nonproliferative diabetic retinopathy with macular edema, bilateral: Secondary | ICD-10-CM | POA: Diagnosis not present

## 2021-10-23 DIAGNOSIS — D3131 Benign neoplasm of right choroid: Secondary | ICD-10-CM | POA: Diagnosis not present

## 2021-10-23 DIAGNOSIS — H35013 Changes in retinal vascular appearance, bilateral: Secondary | ICD-10-CM | POA: Diagnosis not present

## 2021-10-24 ENCOUNTER — Telehealth: Payer: Self-pay

## 2021-10-24 MED ORDER — FREESTYLE LIBRE 2 SENSOR MISC: 1.0000 | 0 refills | Status: DC

## 2021-10-24 NOTE — Telephone Encounter (Signed)
Script sent  

## 2021-10-25 LAB — HM DIABETES EYE EXAM

## 2021-11-12 ENCOUNTER — Ambulatory Visit (INDEPENDENT_AMBULATORY_CARE_PROVIDER_SITE_OTHER): Payer: PPO | Admitting: Pharmacist Clinician (PhC)/ Clinical Pharmacy Specialist

## 2021-11-12 DIAGNOSIS — E1169 Type 2 diabetes mellitus with other specified complication: Secondary | ICD-10-CM | POA: Diagnosis not present

## 2021-11-12 DIAGNOSIS — E785 Hyperlipidemia, unspecified: Secondary | ICD-10-CM | POA: Diagnosis not present

## 2021-11-12 DIAGNOSIS — I10 Essential (primary) hypertension: Secondary | ICD-10-CM

## 2021-11-12 NOTE — Progress Notes (Signed)
11/12/2021 [place] 1946/01/29 [id number]   HPI:  Martin MIHALIK Sr. is a 76 y.o. male patient of Dr Claiborne Billings, who presents today for a lipid clinic evaluation.  See pertinent past medical history below.  He was last seen by Dr. Claiborne Billings in November, but recently had lipid labs drawn and found to have LDL at 94.  He continues to take Livalo 1 mg daily, despite some myalgias, but cannot continue to pay the high co-pays.  Just got a 90 day supply last week, so will continue for now.    Pt has [employer], Repatha co-pay is $45/month until coverage gap then $135/month thereafter.  He is already in the coverage gap for 2023 and notes that with his wife's treatments for bone cancer, they spend a lot of money on healthcare.    In reviewing medications, he is only taking the losartan once daily.  He is scheduled to see Dr. Claiborne Billings tomorrow.     Past Medical History: CAD CABG x 4 09/2018  PAD left lower extremity angiogram, laser atherectomy of the popliteal and proximal anterior tibial arteries with balloon and drug-coated angioplasty in February 2022  HTN On losartan 50 mg bid, metoprolol succ 75 mg qd  DM2 4/23 A1c 8.3 on Novolog tid   Current Medications: ezetimibe 10 mg qd, pitavastatin 1 mg twice weekly  Cholesterol Goals: LDL < 55   Intolerant/previously tried: atorvastatin, pitavastatin, rosuvastatin, simvastatin - all caused myalgias  Family history: doesn't know father history, mother died close to 33, heart failure, no siblings  Diet: about 50/50 eating out vs home;  getting fresh vegetables weekly now from [employer], but admits worse diet in winter when they are more expensive.   Exercise:  no regular exercise - yard work some but less since bypass  Labs: 5/23:  TC 156, TG 120, HDL 37.8, LDL 94   Current Outpatient Medications  Medication Sig Dispense Refill   aspirin 81 MG EC tablet Take 1 tablet (81 mg total) by mouth daily.     azelastine (ASTELIN) 0.1 %  nasal spray Place 1 spray into both nostrils 2 (two) times daily. 30 mL 12   clopidogrel (PLAVIX) 75 MG tablet Take 1 tablet (75 mg total) by mouth daily. 90 tablet 3   Continuous Blood Gluc Receiver (FREESTYLE LIBRE 2 READER) DEVI See admin instructions.     Continuous Blood Gluc Sensor (FREESTYLE LIBRE 2 SENSOR) MISC 1 Device by Does not apply route every 14 (fourteen) days. 6 each 0   ezetimibe (ZETIA) 10 MG tablet Take 1 tablet (10 mg total) by mouth daily. 90 tablet 3   fexofenadine (ALLEGRA) 180 MG tablet Take 180 mg by mouth daily as needed for allergies or rhinitis.     fluticasone (FLONASE) 50 MCG/ACT nasal spray Place 2 sprays into both nostrils daily as needed for allergies or rhinitis.     insulin aspart (NOVOLOG FLEXPEN) 100 UNIT/ML FlexPen 3 times a day (just before each meal), 30-35-35 units, and pen needles 3/day 120 mL 1   Insulin Pen Needle (NOVOFINE) 32G X 6 MM MISC USE 5 TIMES DAILY 450 each 2   loratadine (CLARITIN) 10 MG tablet Take 10 mg by mouth as needed.     losartan (COZAAR) 50 MG tablet Take 1 tablet (50 mg total) by mouth in the morning and at bedtime. 180 tablet 3   metoprolol succinate (TOPROL-XL) 50 MG 24 hr tablet Take 1.5 tablets (75 mg total) by mouth at bedtime.  Take with or immediately following a meal. 90 tablet 5   ONETOUCH VERIO test strip CHECK LEVELS 4 TIMES DAILY 100 strip 2   Pitavastatin Calcium (LIVALO) 1 MG TABS Take 1 tablet (1 mg total) by mouth daily. 90 tablet 3   torsemide (DEMADEX) 20 MG tablet MUST MAKE APPOINTMENT FOR FUTURE REFILL LAST ATTEMPT. TAKE 2 TABLETS BY MOUTH EVERY DAY 30 tablet 0   No current facility-administered medications for this visit.    Allergies  Allergen Reactions   Simvastatin Diarrhea   Statins Other (See Comments)    achey joints   Xanax [Alprazolam] Anxiety    Past Medical History:  Diagnosis Date   A-fib ([place])    CKD (chronic kidney disease) stage 3, GFR 30-59 ml/min (HCC) 08/29/2020   Diabetes mellitus     Heart attack ([place]) 10/01/2018   Pt had open heart surgery   Hyperkalemia 08/29/2020   Hypertension    Osteomyelitis of fifth toe of left foot ([place]) 05/08/2020   Simple chronic bronchitis ([place]) 05/08/2020   Tobacco abuse     Blood pressure 138/83.   Essential hypertension Blood pressure slightly elevated in the office today.  Advised he check the losartan bottle to determine if should be once or twice daily.  He has an appointment with Dr. Claiborne Billings tomorrow, will clarify at that time.    Hyperlipidemia associated with type 2 diabetes mellitus ([place]) Patient with ASCVD and elevated cholesterol, not at goal on Livalo 1 mg daily and having some ongoing myalgias.  Continues to take although cost is also an issue.  Will have him continue with Livalo until gone, then have him switch back to rosuvastatin 5 mg three times weekly.  Because [facility] is not open, he cannot afford another branded medication.  Once the foundation opens, will have him apply and start Repatha 140 mg every 14 days.  Hopefully we can keep him on once or twice weekly statin at the same time.  Check labs after 3 months on Repatha.    Tommy Medal PharmD CPP [facility] [address] [place], [place] [address] [phone]

## 2021-11-12 NOTE — Assessment & Plan Note (Signed)
Patient with ASCVD and elevated cholesterol, not at goal on Livalo 1 mg daily and having some ongoing myalgias.  Continues to take although cost is also an issue.  Will have him continue with Livalo until gone, then have him switch back to rosuvastatin 5 mg three times weekly.  Because [facility] is not open, he cannot afford another branded medication.  Once the foundation opens, will have him apply and start Repatha 140 mg every 14 days.  Hopefully we can keep him on once or twice weekly statin at the same time.  Check labs after 3 months on Repatha.

## 2021-11-12 NOTE — Assessment & Plan Note (Signed)
Blood pressure slightly elevated in the office today.  Advised he check the losartan bottle to determine if should be once or twice daily.  He has an appointment with Dr. Claiborne Billings tomorrow, will clarify at that time.

## 2021-11-12 NOTE — Patient Instructions (Addendum)
Your Results:             Your most recent labs Goal  Total Cholesterol 156 < 200  Triglycerides 120 < 150  HDL (happy/good cholesterol) 37.8 > 40  LDL (lousy/bad cholesterol 94 < 55   Medication changes:  Continue with Livalo until it is gone.  Once this bottle is empty then have the pharmacy fill the rosuvastatin prescription instead.   You can try CoQ10 (200-300 mcg) once daily to see if it helps with the muscle aches.    Continue with ezetimibe 10 mg daily.  If we get the [facility] open again we will reach out to you and get you set up .  At that time we can try Repatha injections every 14 days.    Lab orders:  We want to repeat labs after 2-3 months.  We will send you a lab order to remind you once we get closer to that time.    Patient Assistance:  The [employer] foundation offers assistance to help pay for medication copays.  They will cover copays for all cholesterol lowering meds, including statins, fibrates, omega-3 oils, ezetimibe, Repatha, Praluent, Nexletol, Nexlizet.  The cards are usually good for $2,500 or 12 months, whichever comes first. Go to healthwellfoundation.org Click on "Apply Now" Answer questions as to whom is applying (patient or representative) Your disease fund will be "hypercholesterolemia - Medicare access" They will ask questions about finances and which medications you are taking for cholesterol When you submit, the approval is usually within minutes.  You will need to print the card information from the site You will need to show this information to your pharmacy, they will bill your Medicare Part D plan first -then bill [employer] --for the copay.   You can also call them at [phone], although the hold times can be quite long.   Thank you for choosing [facility]

## 2021-11-13 ENCOUNTER — Ambulatory Visit: Payer: PPO | Admitting: Cardiovascular Disease

## 2021-11-29 ENCOUNTER — Telehealth: Payer: Self-pay | Admitting: Internal Medicine

## 2021-11-29 DIAGNOSIS — E1151 Type 2 diabetes mellitus with diabetic peripheral angiopathy without gangrene: Secondary | ICD-10-CM

## 2021-11-29 MED ORDER — INSULIN PEN NEEDLE 32G X 6 MM MISC: 0 refills | Status: DC

## 2021-11-29 MED ORDER — FREESTYLE LIBRE 2 SENSOR MISC: 1.0000 | 0 refills | Status: DC

## 2021-11-29 MED ORDER — NOVOLOG FLEXPEN 100 UNIT/ML ~~LOC~~ SOPN: 30.0000 [IU] | PEN_INJECTOR | Freq: Three times a day (TID) | SUBCUTANEOUS | 0 refills | Status: DC

## 2021-11-29 NOTE — Telephone Encounter (Signed)
MEDICATION: Novolog Insulin (2 refills), Freestyle Libre 2 sensors (2 refills), Novofine needles (90 days)  PHARMACY:  [facility]  HAS THE PATIENT CONTACTED THEIR PHARMACY?  unknown  IS THIS A 90 DAY SUPPLY : Yes  IS PATIENT OUT OF MEDICATION: Yes  IF NOT; HOW MUCH IS LEFT:   LAST APPOINTMENT DATE: '@8'$ /06/2021  NEXT APPOINTMENT DATE:'@11'$ /20/2023  DO WE HAVE YOUR PERMISSION TO LEAVE A DETAILED MESSAGE?:  OTHER COMMENTS:    **Let patient know to contact pharmacy at the end of the day to make sure medication is ready. **  ** Please notify patient to allow 48-72 hours to process**  **Encourage patient to contact the pharmacy for refills or they can request refills through [facility]**

## 2021-11-29 NOTE — Telephone Encounter (Signed)
Rx sent to preferred pharmacy.

## 2021-12-03 ENCOUNTER — Ambulatory Visit: Payer: PPO | Admitting: Pulmonary Disease

## 2021-12-03 ENCOUNTER — Encounter: Payer: Self-pay | Admitting: Pulmonary Disease

## 2021-12-03 VITALS — BP 124/72 | HR 84 | Ht 72.0 in | Wt 276.4 lb

## 2021-12-03 DIAGNOSIS — R0602 Shortness of breath: Secondary | ICD-10-CM | POA: Diagnosis not present

## 2021-12-03 DIAGNOSIS — R053 Chronic cough: Secondary | ICD-10-CM | POA: Diagnosis not present

## 2021-12-03 MED ORDER — TRELEGY ELLIPTA 100-62.5-25 MCG/ACT IN AEPB: 1.0000 | INHALATION_SPRAY | Freq: Every day | RESPIRATORY_TRACT | 0 refills | Status: DC

## 2021-12-03 MED ORDER — MONTELUKAST SODIUM 10 MG PO TABS: 10.0000 mg | ORAL_TABLET | Freq: Every day | ORAL | 11 refills | Status: AC

## 2021-12-03 NOTE — Progress Notes (Unsigned)
Synopsis: Referred in August 2023 for chronic cough by [name], MD  Subjective:   PATIENT ID: Martin [name] GENDER: male DOB: 24-May-1945, MRN: [id number]  HPI  Chief Complaint  Patient presents with   Consult    Referred by PCP for chronic cough for the past year. Cough is sometimes productive with yellowish phlegm. Cough was worse in the Spring.    Martin [name] is a 76 year old male, former smoker with atrial fibrillation, CKDIII, DMII, CAD, and hypertension who is referred for chronic cough.   He reports having cough over the past year. The cough is worse in the Spring. The cough can be productive. He went to the beach in July and reports his cough was better at that time. He will have increased cough after mowing his property near [place] which makes his cough worse.   He is taking allegra, flonase and astelin for his allergies. He denies any issues with his breathing or cough with cold air. He denies GERD.  He smoked for 4 years and quit in 2004. He is a retired Education administrator. He had second hand smoke exposure in childhood from his parents.   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   Tobacco abuse      Family History  Problem Relation Age of Onset   Hypertension Mother    Heart disease Mother        CHF did not see doctor   Diabetes Maternal Grandmother    Diabetes Son      Social History   Socioeconomic History   Marital status: Married    Spouse name: Not on file   Number of children: Not on file   Years of education: Not on file   Highest education level: Not on file  Occupational History   Not on file  Tobacco Use    Smoking status: Former    Packs/day: 0.75    Years: 2.00    Total pack years: 1.50    Types: Cigarettes    Start date: 2002    Quit date: 04/29/2002    Years since quitting: 19.6   Smokeless tobacco: Never   Tobacco comments:    smoked for 10 years on and off  Substance and Sexual Activity   Alcohol use: Yes    Comment: occasionally   Drug use: No   Sexual activity: Yes  Other Topics Concern   Not on file  Social History Narrative   Married. 3 kids. 7 grandkids. No greatgrandkids.       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     azelastine (ASTELIN) 0.1 % nasal spray Place 1 spray into both nostrils 2 (two) times daily. 30 mL 12   clopidogrel (PLAVIX) 75 MG tablet Take 1 tablet (75 mg total) by mouth daily. 90 tablet 3   Continuous Blood Gluc Receiver (FREESTYLE LIBRE 2 READER) DEVI See admin instructions.     Continuous Blood Gluc Sensor (FREESTYLE LIBRE 2 SENSOR) MISC 1 Device by Does not apply route every 14 (fourteen) days. 6 each 0   fexofenadine (ALLEGRA) 180 MG tablet Take 180 mg by mouth daily as needed for allergies or rhinitis.     fluticasone (FLONASE) 50 MCG/ACT nasal spray Place 2 sprays into both nostrils daily as needed for allergies or rhinitis.     insulin aspart (NOVOLOG FLEXPEN) 100 UNIT/ML FlexPen Inject 30-35 Units into the skin 3 (three) times  daily with meals. 3 times a day (just before each meal), 30-35-35 units, and pen needles 3/day 105 mL 0   Insulin Pen Needle 32G X 6 MM MISC Use as instructed to administer insulin 5X daily 500 each 0   losartan (COZAAR) 50 MG tablet Take 1 tablet (50 mg total) by mouth in the morning and at bedtime. 180 tablet 3   metoprolol succinate (TOPROL-XL) 50 MG 24 hr tablet Take 1.5 tablets (75 mg total) by mouth at bedtime. Take with or immediately following a meal. 90 tablet 5   ONETOUCH VERIO test strip CHECK LEVELS 4 TIMES DAILY 100 strip 2   Pitavastatin Calcium (LIVALO) 1 MG TABS Take 1 tablet (1 mg total) by mouth daily. 90 tablet 3   torsemide (DEMADEX) 20 MG tablet MUST MAKE APPOINTMENT FOR FUTURE REFILL LAST ATTEMPT. TAKE 2 TABLETS BY MOUTH EVERY DAY 30 tablet 0   ezetimibe (ZETIA) 10 MG tablet Take 1 tablet (10 mg total) by mouth daily. 90 tablet 3   loratadine (CLARITIN) 10 MG tablet Take 10 mg by mouth as needed.     No facility-administered medications prior to visit.   Review of Systems  Constitutional:  Negative for chills, fever, malaise/fatigue and weight loss.  HENT:  Negative for congestion, sinus pain and sore throat.   Eyes: Negative.   Respiratory:  Positive for cough, sputum production and shortness of breath. Negative for hemoptysis and wheezing.   Cardiovascular:  Negative for chest pain, palpitations, orthopnea, claudication and leg swelling.  Gastrointestinal:  Negative for abdominal pain, heartburn, nausea and vomiting.  Genitourinary: Negative.   Musculoskeletal:  Positive for joint pain. Negative for myalgias.  Skin:  Negative for rash.  Neurological:  Negative for weakness.  Endo/Heme/Allergies:  Positive for environmental allergies.  Psychiatric/Behavioral: Negative.     Objective:   Vitals:   12/03/21 1506  BP: 124/72  Pulse: 84  SpO2: 96%  Weight: 276 lb 6.4 oz (125.4 kg)  Height: 6' (1.829 m)   Physical Exam Constitutional:      General: He is not in  acute distress. HENT:     Head: Normocephalic and atraumatic.  Eyes:     Conjunctiva/sclera: Conjunctivae normal.  Cardiovascular:     Rate and Rhythm: Normal rate and regular rhythm.     Pulses: Normal pulses.     Heart sounds: Normal heart sounds. No murmur heard. Pulmonary:     Effort: Pulmonary effort is normal.     Breath sounds: Normal breath  sounds. No wheezing, rhonchi or rales.  Abdominal:     General: Bowel sounds are normal.     Palpations: Abdomen is soft.  Musculoskeletal:     Right lower leg: No edema.     Left lower leg: No edema.  Lymphadenopathy:     Cervical: No cervical adenopathy.  Skin:    General: Skin is warm and dry.  Neurological:     General: No focal deficit present.     Mental Status: He is alert.  Psychiatric:        Mood and Affect: Mood normal.        Behavior: Behavior normal.        Thought Content: Thought content normal.        Judgment: Judgment normal.    CBC    Component Value Date/Time   WBC 7.9 07/25/2021 0851   RBC 4.40 07/25/2021 0851   HGB 13.4 07/25/2021 0851   HCT 40.5 07/25/2021 0851   PLT 259 07/25/2021 0851   MCV 92.0 07/25/2021 0851   MCH 30.5 07/25/2021 0851   MCHC 33.1 07/25/2021 0851   RDW 12.7 07/25/2021 0851   LYMPHSABS 2,054 07/25/2021 0851   MONOABS 0.5 08/16/2020 1030   EOSABS 237 07/25/2021 0851   BASOSABS 24 07/25/2021 0851      Latest Ref Rng & Units 07/25/2021    8:51 AM 04/03/2021    8:53 AM 12/18/2020   10:24 AM  BMP  Glucose 65 - 99 mg/dL 166  154  177   BUN 7 - 25 mg/dL '22  22  25   '$ Creatinine 0.70 - 1.28 mg/dL 1.44  1.45  1.34   BUN/Creat Ratio 6 - 22 (calc) '15  15  19   '$ Sodium 135 - 146 mmol/L 137  140  140   Potassium 3.5 - 5.3 mmol/L 4.7  5.4  5.2   Chloride 98 - 110 mmol/L 100  104  104   CO2 20 - 32 mmol/L '30  30  27   '$ Calcium 8.6 - 10.3 mg/dL 9.3  9.4  9.4    Chest imaging:  PFT:     No data to display          Labs:  Path:  Echo:  Heart Catheterization:  Assessment  & Plan:   Shortness of breath - Plan: Pulmonary Function Test  Chronic cough - Plan: Pulmonary Function Test, montelukast (SINGULAIR) 10 MG tablet  Discussion: Martin [name] is a 76 year old male, former smoker with atrial fibrillation, CKDIII, DMII, CAD, and hypertension who is referred for chronic cough.   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   He is to start montelukast '10mg'$  daily at bedtime and try trelegy ellipta inhaler.   If cough not improved we will update his chest imaging and consider empiric treatment of reflux disease.  We will check pulmonary function tests at follow up in 1 month.  Freda [name], MD [facility] Office: [phone]   Current Outpatient Medications:    aspirin 81 MG EC tablet, Take 1 tablet (81 mg total) by mouth daily., Disp: , Rfl:    azelastine (ASTELIN) 0.1 % nasal spray, Place 1 spray into both nostrils 2 (two) times daily., Disp: 30 mL, Rfl: 12   clopidogrel (PLAVIX) 75 MG tablet, Take 1 tablet (75 mg total) by mouth daily., Disp: 90 tablet, Rfl: 3   Continuous Blood Gluc Receiver (FREESTYLE  LIBRE 2 READER) DEVI, See admin instructions., Disp: , Rfl:    Continuous Blood Gluc Sensor (FREESTYLE LIBRE 2 SENSOR) MISC, 1 Device by Does not apply route every 14 (fourteen) days., Disp: 6 each, Rfl: 0   fexofenadine (ALLEGRA) 180 MG tablet, Take 180 mg by mouth daily as needed for allergies or rhinitis., Disp: , Rfl:    fluticasone (FLONASE) 50 MCG/ACT nasal spray, Place 2 sprays into both nostrils daily as needed for allergies or rhinitis., Disp: , Rfl:    Fluticasone-Umeclidin-Vilant (TRELEGY ELLIPTA) 100-62.5-25 MCG/ACT AEPB, Inhale 1 puff into the lungs daily., Disp: 1 each, Rfl: 0   insulin aspart (NOVOLOG FLEXPEN) 100 UNIT/ML FlexPen, Inject 30-35 Units into the skin 3 (three) times daily with meals. 3 times a day (just before each meal),  30-35-35 units, and pen needles 3/day, Disp: 105 mL, Rfl: 0   Insulin Pen Needle 32G X 6 MM MISC, Use as instructed to administer insulin 5X daily, Disp: 500 each, Rfl: 0   losartan (COZAAR) 50 MG tablet, Take 1 tablet (50 mg total) by mouth in the morning and at bedtime., Disp: 180 tablet, Rfl: 3   metoprolol succinate (TOPROL-XL) 50 MG 24 hr tablet, Take 1.5 tablets (75 mg total) by mouth at bedtime. Take with or immediately following a meal., Disp: 90 tablet, Rfl: 5   montelukast (SINGULAIR) 10 MG tablet, Take 1 tablet (10 mg total) by mouth at bedtime., Disp: 30 tablet, Rfl: 11   ONETOUCH VERIO test strip, CHECK LEVELS 4 TIMES DAILY, Disp: 100 strip, Rfl: 2   Pitavastatin Calcium (LIVALO) 1 MG TABS, Take 1 tablet (1 mg total) by mouth daily., Disp: 90 tablet, Rfl: 3   torsemide (DEMADEX) 20 MG tablet, MUST MAKE APPOINTMENT FOR FUTURE REFILL LAST ATTEMPT. TAKE 2 TABLETS BY MOUTH EVERY DAY, Disp: 30 tablet, Rfl: 0

## 2021-12-03 NOTE — Patient Instructions (Addendum)
Try trelegy ellipta 1 puff daily over the next month - rinse mouth out after each use  Start montelukast '10mg'$  daily at bed time  Follow up in 1 month with pulmonary function tests

## 2021-12-05 ENCOUNTER — Encounter: Payer: Self-pay | Admitting: Pulmonary Disease

## 2021-12-10 NOTE — Telephone Encounter (Signed)
Error

## 2021-12-18 ENCOUNTER — Encounter: Payer: Self-pay | Admitting: Family Medicine

## 2021-12-18 ENCOUNTER — Ambulatory Visit (INDEPENDENT_AMBULATORY_CARE_PROVIDER_SITE_OTHER): Payer: PPO | Admitting: Family Medicine

## 2021-12-18 VITALS — BP 120/68 | HR 70 | Temp 97.7°F | Ht 72.0 in | Wt 279.8 lb

## 2021-12-18 DIAGNOSIS — Z794 Long term (current) use of insulin: Secondary | ICD-10-CM | POA: Diagnosis not present

## 2021-12-18 DIAGNOSIS — E1151 Type 2 diabetes mellitus with diabetic peripheral angiopathy without gangrene: Secondary | ICD-10-CM

## 2021-12-18 DIAGNOSIS — Z87891 Personal history of nicotine dependence: Secondary | ICD-10-CM

## 2021-12-18 DIAGNOSIS — Z0000 Encounter for general adult medical examination without abnormal findings: Secondary | ICD-10-CM | POA: Diagnosis not present

## 2021-12-18 LAB — CBC WITH DIFFERENTIAL/PLATELET
Basophils Absolute: 0 10*3/uL (ref 0.0–0.1)
Basophils Relative: 0.4 % (ref 0.0–3.0)
Eosinophils Absolute: 0.2 10*3/uL (ref 0.0–0.7)
Eosinophils Relative: 3.1 % (ref 0.0–5.0)
HCT: 39.1 % (ref 39.0–52.0)
Hemoglobin: 13 g/dL (ref 13.0–17.0)
Lymphocytes Relative: 25.5 % (ref 12.0–46.0)
Lymphs Abs: 1.5 10*3/uL (ref 0.7–4.0)
MCHC: 33.3 g/dL (ref 30.0–36.0)
MCV: 92.1 fl (ref 78.0–100.0)
Monocytes Absolute: 0.6 10*3/uL (ref 0.1–1.0)
Monocytes Relative: 9.6 % (ref 3.0–12.0)
Neutro Abs: 3.5 10*3/uL (ref 1.4–7.7)
Neutrophils Relative %: 61.4 % (ref 43.0–77.0)
Platelets: 251 10*3/uL (ref 150.0–400.0)
RBC: 4.24 Mil/uL (ref 4.22–5.81)
RDW: 14.6 % (ref 11.5–15.5)
WBC: 5.7 10*3/uL (ref 4.0–10.5)

## 2021-12-18 LAB — COMPREHENSIVE METABOLIC PANEL
ALT: 15 U/L (ref 0–53)
AST: 14 U/L (ref 0–37)
Albumin: 4.2 g/dL (ref 3.5–5.2)
Alkaline Phosphatase: 53 U/L (ref 39–117)
BUN: 18 mg/dL (ref 6–23)
CO2: 29 mEq/L (ref 19–32)
Calcium: 9.1 mg/dL (ref 8.4–10.5)
Chloride: 103 mEq/L (ref 96–112)
Creatinine, Ser: 1.41 mg/dL (ref 0.40–1.50)
GFR: 48.12 mL/min — ABNORMAL LOW (ref >60.00)
Glucose, Bld: 200 mg/dL — ABNORMAL HIGH (ref 70–99)
Potassium: 4.4 mEq/L (ref 3.5–5.1)
Sodium: 138 mEq/L (ref 135–145)
Total Bilirubin: 1 mg/dL (ref 0.2–1.2)
Total Protein: 7.3 g/dL (ref 6.0–8.3)

## 2021-12-18 LAB — URINALYSIS, ROUTINE W REFLEX MICROSCOPIC
Bilirubin Urine: NEGATIVE
Ketones, ur: NEGATIVE
Leukocytes,Ua: NEGATIVE
Nitrite: NEGATIVE
Specific Gravity, Urine: >=1.03 — AB (ref 1.000–1.030)
Total Protein, Urine: 100 — AB
Urine Glucose: 250 — AB
Urobilinogen, UA: 0.2 (ref 0.0–1.0)
pH: 6 (ref 5.0–8.0)

## 2021-12-18 LAB — MICROALBUMIN / CREATININE URINE RATIO
Creatinine,U: 191.9 mg/dL
Microalb Creat Ratio: 39.4 mg/g — ABNORMAL HIGH (ref 0.0–30.0)
Microalb, Ur: 75.5 mg/dL — ABNORMAL HIGH (ref 0.0–1.9)

## 2021-12-18 MED ORDER — EZETIMIBE 10 MG PO TABS: 10.0000 mg | ORAL_TABLET | Freq: Every day | ORAL | 3 refills | Status: DC

## 2021-12-18 NOTE — Progress Notes (Signed)
Phone: [phone]   Subjective:  Patient presents today for their annual physical. Chief complaint-noted.   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 ([place]) 05/08/2020   Simple chronic bronchitis ([place]) 05/08/2020   Tobacco abuse    Patient Active Problem List   Diagnosis Date Noted   Osteomyelitis of fifth toe of left foot ([place]) 05/08/2020    Priority: High   Postoperative atrial fibrillation ([place]) 10/24/2018    Priority: High   Coronary artery disease s/p CABG 10/01/2018 after STEMI     Priority: High   Undiagnosed cardiac murmurs 08/15/2015    Priority: High   Diabetes ([place]) 12/14/2009    Priority: High   CKD (chronic kidney disease) stage 3, GFR 30-59 ml/min ([place]) 08/29/2020    Priority: Medium    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    Priority: Medium    PAD (peripheral artery disease) ([place]) 08/20/2017    Priority: Medium    Partial tear of right Achilles tendon 02/12/2017    Priority: Medium    Aortic atherosclerosis ([place]) 10/07/2016    Priority: Medium    History of skin cancer 08/15/2015    Priority: Medium    Diabetic retinopathy ([place]) 02/21/2015    Priority: Medium    Hyperlipidemia associated with type 2 diabetes mellitus ([place]) 08/24/2014    Priority: Medium    Diabetic polyneuropathy ([place]) 04/11/2009    Priority: Medium    Essential hypertension 02/03/2007    Priority: Medium    Acute blood loss as cause of postoperative anemia 10/24/2018    Priority: Low   BPH  (benign prostatic hyperplasia) 10/24/2018    Priority: Low   History of depression 10/24/2018    Priority: Low   S/P CABG x 4 10/01/2018    Priority: Low   Onychomycosis of toenail 02/17/2018    Priority: Low   Allergic rhinitis 08/20/2017    Priority: Low   C. 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   Past Surgical History:  Procedure Laterality Date   CORONARY ARTERY BYPASS GRAFT N/A 10/01/2018   Procedure: CORONARY ARTERY BYPASS GRAFTING (CABG) TIMES  FOUR USING LEFT MAMMARY ARTERY AND RIGHT GREATER SAPHEANOUS VEIN HARVESTED ENDOSCOPICALLY;  Surgeon: Melrose Nakayama, MD;  Location: [facility];  Service: Open Heart Surgery;  Laterality: N/A;   HERNIA REPAIR     >10 years ago   IR ANGIOGRAM EXTREMITY LEFT  06/16/2020   IR RADIOLOGIST EVAL & MGMT  12/24/2016   IR RADIOLOGIST EVAL & MGMT  05/30/2020   IR RADIOLOGIST EVAL & MGMT  07/13/2020   IR RADIOLOGIST EVAL & MGMT  09/06/2020  IR RADIOLOGIST EVAL & MGMT  12/26/2020   IR THORACENTESIS ASP PLEURAL SPACE W/IMG GUIDE  10/06/2018   IR US GUIDE VASC ACCESS RIGHT  06/16/2020   LEFT HEART CATH AND CORONARY ANGIOGRAPHY N/A 09/29/2018   Procedure: LEFT HEART CATH AND CORONARY ANGIOGRAPHY;  Surgeon: Troy Sine, MD;  Location: [facility];  Service: Cardiovascular;  Laterality: N/A;   open heart surfery     right shoulder surgery     around 2014   TEE WITHOUT CARDIOVERSION N/A 10/01/2018   Procedure: TRANSESOPHAGEAL ECHOCARDIOGRAM (TEE);  Surgeon: Melrose Nakayama, MD;  Location: [facility];  Service: Open Heart Surgery;  Laterality: N/A;    Family History  Problem Relation Age of Onset   Hypertension  Mother    Heart disease Mother        CHF did not see doctor   Diabetes Maternal Grandmother    Diabetes Son     Medications- reviewed and updated Current Outpatient Medications  Medication Sig Dispense Refill   aspirin 81 MG EC tablet Take 1 tablet (81 mg total) by mouth daily.     azelastine (ASTELIN) 0.1 % nasal spray Place 1 spray into both nostrils 2 (two) times daily. 30 mL 12   clopidogrel (PLAVIX) 75 MG tablet Take 1 tablet (75 mg total) by mouth daily. 90 tablet 3   Continuous Blood Gluc Receiver (FREESTYLE LIBRE 2 READER) DEVI See admin instructions.     Continuous Blood Gluc Sensor (FREESTYLE LIBRE 2 SENSOR) MISC 1 Device by Does not apply route every 14 (fourteen) days. 6 each 0   fluticasone (FLONASE) 50 MCG/ACT nasal spray Place 2 sprays into both nostrils daily as needed for allergies or rhinitis.     Fluticasone-Umeclidin-Vilant (TRELEGY ELLIPTA) 100-62.5-25 MCG/ACT AEPB Inhale 1 puff into the lungs daily. 1 each 0   insulin aspart (NOVOLOG FLEXPEN) 100 UNIT/ML FlexPen Inject 30-35 Units into the skin 3 (three) times daily with meals. 3 times a day (just before each meal), 30-35-35 units, and pen needles 3/day 105 mL 0   Insulin Pen Needle 32G X 6 MM MISC Use as instructed to administer insulin 5X daily 500 each 0   losartan (COZAAR) 50 MG tablet Take 1 tablet (50 mg total) by mouth in the morning and at bedtime. 180 tablet 3   metoprolol succinate (TOPROL-XL) 50 MG 24 hr tablet Take 1.5 tablets (75 mg total) by mouth at bedtime. Take with or immediately following a meal. 90 tablet 5   montelukast (SINGULAIR) 10 MG tablet Take 1 tablet (10 mg total) by mouth at bedtime. 30 tablet 11   ONETOUCH VERIO test strip CHECK LEVELS 4 TIMES DAILY 100 strip 2   Pitavastatin Calcium (LIVALO) 1 MG TABS Take 1 tablet (1 mg total) by mouth daily. 90 tablet 3   torsemide (DEMADEX) 20 MG tablet MUST MAKE APPOINTMENT FOR FUTURE REFILL LAST ATTEMPT. TAKE 2 TABLETS BY MOUTH EVERY DAY 30 tablet 0    No current facility-administered medications for this visit.    Allergies-reviewed and updated Allergies  Allergen Reactions   Simvastatin Diarrhea   Statins Other (See Comments)    achey joints   Xanax [Alprazolam] Anxiety    Social History   Social History Narrative   Married. 3 kids. 7 grandkids. No greatgrandkids.       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 Oropharynx normal Neck: no thyromegaly CV: some wheezing and also crackles at bases Lungs: CTAB no crackles, wheeze, rhonchi Abdomen: soft/nontender/nondistended/normal bowel sounds. No rebound or guarding.  Ext: trace to 1+ edema Skin: warm, dry Neuro: grossly normal, moves all extremities, PERRLA   Assessment and Plan  76 y.o. male presenting for annual physical.  Health Maintenance counseling: 1. Anticipatory guidance: Patient counseled regarding regular dental exams - typically q6 months, eye exams - yearly,  avoiding smoking and second hand smoke- , limiting alcohol to 2 beverages per day - well under, no illicit drugs.   2. Risk factor reduction:  Advised patient of need for regular exercise and diet rich and fruits and vegetables to reduce risk of heart attack and stroke.  Exercise- trying to stay active [place] - has [employer] encouraged him to use- goal 150 mins a week.  Diet/weight management-weight is up 16 pounds from last physical. Trying to watch carbs- having some trouble with sugar issues Wt Readings from Last 3 Encounters:  12/18/21 279 lb 12.8 oz (126.9 kg)  12/03/21 276 lb 6.4 oz (125.4 kg)  08/22/21 280 lb 9.6 oz (127.3 kg)  3. Immunizations/screenings/ancillary studies-fall flu shot  recommended , shingrix declines , wants to hold off on covid shot  Immunization History  Administered Date(s) Administered   Fluad Quad(high Dose 65+) 01/06/2019, 02/09/2020, 01/25/2021   Influenza Split 02/06/2011, 01/28/2012   Influenza Whole 02/08/2010   Influenza, High Dose Seasonal PF 02/14/2016, 02/12/2017, 02/17/2018   Influenza,inj,Quad PF,6+ Mos 02/21/2015   Moderna Sars-Covid-2 Vaccination 05/30/2019, 06/27/2019, 04/13/2020   Pneumococcal Conjugate-13 08/24/2014   Pneumococcal Polysaccharide-23 07/18/2008, 08/15/2015   Td 04/29/2004   Tdap 08/24/2014   Zoster, Live 02/08/2010  4. Prostate cancer screening- no new urinary symptoms-we have opted out of screening every 70 Lab Results  Component Value Date   PSA 0.38 08/08/2015   PSA 0.42 04/05/2014   PSA 0.28 09/02/2012   5. Colon cancer screening - past age based screening for recommendations-last year recommended Cologuard- he tried but was told not good sample. He opts out at this point   6. Skin cancer screening-follows with skin surgery center- recommended scheduling follow up. advised regular sunscreen use.  7. Smoking associated screening (lung cancer screening, AAA screen 65-75, UA)-former smoker- quit in 2004 with 1.5 pack years-did urinalysis 8. STD screening - only active with wife  Status of chronic or acute concerns   #Chronic cough since November 2021- now seeing pulmonology in 2023 -Normal chest x-ray 06/30/2020 -Improvement with treatment for sinusitis in the past with Augmentin  -added april 2022 Astelin-previously had taking Claritin but felt lightheaded in the day and stopped, Mucinex, Flonase -worse after mowing grass- big flare up -Have considered adding reflux medication  -Originally held off on prednisone due to patient's treatment for osteomyelitis with PICC line  -Has seen pulmonology Dr. 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 Has opted  out of CT scan (concerned about cost. In October will do a breathing test.   #Osteomyelitis of fifth toe left foot- on chronic daptomycin through infectious disease previously-now being observed without antibiotics - he has to reschedule follow up  # Diabetes-managed by endocrinology Dr.  Gherghe (prior Dr. Loanne Drilling) -History of diabetic retinopathy  -History of diabetic polyneuropathy which contributed to diabetic wound/ulcer/osteomyelitis S: Medication: NovoLog having to take 5x a day (was taken off lantus it appears)-hed like to discuss with Dr. Cruzita Lederer going back on Lab Results  Component Value Date   HGBA1C 8.3 (A) 08/20/2021   HGBA1C 8.1 (A) 06/04/2021   HGBA1C 9.1 (A) 12/18/2020  A/P: he wants to hold off on this and complete with Dr. Cruzita Lederer in the fall  #CAD-no chest pain. Some SOB with cough- working with pulmonology  #Peripheral arterial disease- history of angioplasty in February 2022.  Statin myalgias  #hyperlipidemia-with LDL goal under 70 particular with aortic atherosclerosis  S: Medication:Aspirin 81 mg, Plavix 75 mg (interventional radiology has recommended continuing aspirin and Plavix), Zetia 10 mg, Livalo 1 mg Lab Results  Component Value Date   CHOL 156 09/25/2021   HDL 37.80 (L) 09/25/2021   LDLCALC 94 09/25/2021   LDLDIRECT 173.0 07/01/2018   TRIG 120.0 09/25/2021   CHOLHDL 4 09/25/2021  A/P: working with cardiology and may transition to rosuvastatin 5 mg 3 times a week-but primary plan is Repatha if foundation reopens   #hypertension S: medication: Losartan 50 mg BID, metoprolol 75 mg XR in evening, torsemide 40 mg (off for 30 days) BP Readings from Last 3 Encounters:  12/18/21 120/68  12/03/21 124/72  11/12/21 138/83   A/P: Controlled. Continue current medications. Encouraged to restart torsemide as has some crackles and edema  #Iliac artery aneurysm- on repeat ultrasound 2021 noted to be ectatic only-consider 3 to 5-year repeat  #Protein in urine  2022-will get urine microalbu/cr ratio- wonder about sglt2 inhibitor like jardiance benefit   Recommended follow up: Return in about 6 months (around 06/20/2022) for followup or sooner if needed.Schedule b4 you leave. Future Appointments  Date Time Provider [place]  02/19/2022  9:00 AM [facility] PFT ROOM [facility] None  02/19/2022 10:15 AM Freddi Starr, MD [facility] None  03/18/2022  8:20 AM Philemon Kingdom, MD [facility] None    Lab/Order associations: fasting   ICD-10-CM   1. Preventative health care  Z00.00     2. Type 2 diabetes mellitus with diabetic peripheral angiopathy without gangrene, with long-term current use of insulin (HCC)  E11.51    Z79.4     3. Former smoker  Z87.891       No orders of the defined types were placed in this encounter.   Return precautions advised.  [name], MD

## 2021-12-18 NOTE — Patient Instructions (Addendum)
Flu shot- we should have these available within a month or two but please let us know if you get at outside pharmacy  Schedule dermatology follow up   Please stop by lab before you go If you have mychart- we will send your results within 3 business days of [place] receiving them.  If you do not have mychart- we will call you about results within 5 business days of [place] receiving them.  *please also note that you will see labs on mychart as soon as they post. I will later go in and write notes on them- will say "notes from Dr. Yong Channel"   Recommended follow up: Return in about 6 months (around 06/20/2022) for followup or sooner if needed.Schedule b4 you leave.

## 2021-12-19 ENCOUNTER — Other Ambulatory Visit: Payer: Self-pay

## 2021-12-19 MED ORDER — EMPAGLIFLOZIN 10 MG PO TABS: 10.0000 mg | ORAL_TABLET | Freq: Every day | ORAL | 3 refills | Status: DC

## 2021-12-21 ENCOUNTER — Other Ambulatory Visit: Payer: Self-pay

## 2021-12-25 ENCOUNTER — Other Ambulatory Visit: Payer: Self-pay | Admitting: *Deleted

## 2021-12-25 NOTE — Patient Outreach (Signed)
  Care Coordination   12/25/2021 Name: Martin [name] [facility] Sr. MRN: [id number] DOB: 08-Oct-1945   Care Coordination Outreach Attempts:  An unsuccessful telephone outreach was attempted today to offer the patient information about available care coordination services as a benefit of their health plan.   Follow Up Plan:  Additional outreach attempts will be made to offer the patient care coordination information and services.   Encounter Outcome:  No Answer  Care Coordination Interventions Activated:  No   Care Coordination Interventions:  No, not indicated    Raina Mina, RN Care Management Coordinator [place] Office [phone]

## 2022-01-15 DIAGNOSIS — E113213 Type 2 diabetes mellitus with mild nonproliferative diabetic retinopathy with macular edema, bilateral: Secondary | ICD-10-CM | POA: Diagnosis not present

## 2022-01-15 DIAGNOSIS — H40023 Open angle with borderline findings, high risk, bilateral: Secondary | ICD-10-CM | POA: Diagnosis not present

## 2022-01-15 DIAGNOSIS — D3131 Benign neoplasm of right choroid: Secondary | ICD-10-CM | POA: Diagnosis not present

## 2022-01-15 DIAGNOSIS — H35013 Changes in retinal vascular appearance, bilateral: Secondary | ICD-10-CM | POA: Diagnosis not present

## 2022-01-17 ENCOUNTER — Ambulatory Visit: Payer: Self-pay

## 2022-01-17 NOTE — Patient Outreach (Signed)
  Care Coordination   01/17/2022 Name: Martin [name] [facility] Sr. MRN: [id number] DOB: 1945/12/16   Care Coordination Outreach Attempts:  A second unsuccessful outreach was attempted today to offer the patient with information about available care coordination services as a benefit of their health plan.     Follow Up Plan:  Additional outreach attempts will be made to offer the patient care coordination information and services.   Encounter Outcome:  No Answer  Care Coordination Interventions Activated:  No   Care Coordination Interventions:  No, not indicated    Daneen Schick, BSW, CDP Social Worker, Certified Dementia Practitioner [facility] Care Management  Care Coordination [phone]

## 2022-01-21 ENCOUNTER — Encounter: Payer: Self-pay | Admitting: *Deleted

## 2022-01-21 DIAGNOSIS — E113213 Type 2 diabetes mellitus with mild nonproliferative diabetic retinopathy with macular edema, bilateral: Secondary | ICD-10-CM | POA: Diagnosis not present

## 2022-01-21 DIAGNOSIS — H40023 Open angle with borderline findings, high risk, bilateral: Secondary | ICD-10-CM | POA: Diagnosis not present

## 2022-01-21 DIAGNOSIS — H35372 Puckering of macula, left eye: Secondary | ICD-10-CM | POA: Diagnosis not present

## 2022-01-21 DIAGNOSIS — D3131 Benign neoplasm of right choroid: Secondary | ICD-10-CM | POA: Diagnosis not present

## 2022-01-22 ENCOUNTER — Telehealth: Payer: Self-pay | Admitting: *Deleted

## 2022-01-22 NOTE — Patient Outreach (Signed)
  Care Coordination   01/22/2022 Name: Martin [name] [facility] Sr. MRN: [id number] DOB: January 30, 1946   Care Coordination Outreach Attempts:  A third unsuccessful outreach was attempted today to offer the patient with information about available care coordination services as a benefit of their health plan.   Follow Up Plan:  No further outreach attempts will be made at this time. We have been unable to contact the patient to offer or enroll patient in care coordination services  Encounter Outcome:  No Answer  Care Coordination Interventions Activated:  No   Care Coordination Interventions:  No, not indicated      Raina Mina, RN Care Management Coordinator [place] Office [phone]

## 2022-02-19 ENCOUNTER — Ambulatory Visit: Payer: PPO | Admitting: Pulmonary Disease

## 2022-03-08 ENCOUNTER — Encounter: Payer: Self-pay | Admitting: Pulmonary Disease

## 2022-03-18 ENCOUNTER — Ambulatory Visit: Payer: PPO | Admitting: Internal Medicine

## 2022-03-18 ENCOUNTER — Encounter: Payer: Self-pay | Admitting: Internal Medicine

## 2022-03-18 VITALS — BP 130/80 | HR 78 | Ht 72.0 in | Wt 271.8 lb

## 2022-03-18 DIAGNOSIS — E785 Hyperlipidemia, unspecified: Secondary | ICD-10-CM | POA: Diagnosis not present

## 2022-03-18 DIAGNOSIS — Z794 Long term (current) use of insulin: Secondary | ICD-10-CM | POA: Diagnosis not present

## 2022-03-18 DIAGNOSIS — E1169 Type 2 diabetes mellitus with other specified complication: Secondary | ICD-10-CM | POA: Diagnosis not present

## 2022-03-18 DIAGNOSIS — E1151 Type 2 diabetes mellitus with diabetic peripheral angiopathy without gangrene: Secondary | ICD-10-CM

## 2022-03-18 DIAGNOSIS — E7849 Other hyperlipidemia: Secondary | ICD-10-CM | POA: Insufficient documentation

## 2022-03-18 LAB — POCT GLYCOSYLATED HEMOGLOBIN (HGB A1C): Hemoglobin A1C: 8.9 % — AB (ref 4.0–5.6)

## 2022-03-18 MED ORDER — FREESTYLE LIBRE 2 SENSOR MISC: 1.0000 | 3 refills | Status: DC

## 2022-03-18 MED ORDER — LANTUS SOLOSTAR 100 UNIT/ML ~~LOC~~ SOPN: 20.0000 [IU] | PEN_INJECTOR | Freq: Every day | SUBCUTANEOUS | 3 refills | Status: DC

## 2022-03-18 MED ORDER — NOVOLOG FLEXPEN 100 UNIT/ML ~~LOC~~ SOPN: 20.0000 [IU] | PEN_INJECTOR | Freq: Three times a day (TID) | SUBCUTANEOUS | 3 refills | Status: DC

## 2022-03-18 MED ORDER — EMPAGLIFLOZIN 10 MG PO TABS: 10.0000 mg | ORAL_TABLET | Freq: Every day | ORAL | 3 refills | Status: DC

## 2022-03-18 NOTE — Patient Instructions (Addendum)
Please start: - Jardiance 10 mg before breakfast - Lantus 20 units at bedtime (start at 15 unit)  Decrease: - Novolog 20-25 units 15 min before the 3 meals  Please return in 3-4 months with your sugar log.   PATIENT INSTRUCTIONS FOR TYPE 2 DIABETES:  **Please join MyChart!** - see attached instructions about how to join if you have not done so already.  DIET AND EXERCISE Diet and exercise is an important part of diabetic treatment.  We recommended aerobic exercise in the form of brisk walking (working between 40-60% of maximal aerobic capacity, similar to brisk walking) for 150 minutes per week (such as 30 minutes five days per week) along with 3 times per week performing 'resistance' training (using various gauge rubber tubes with handles) 5-10 exercises involving the major muscle groups (upper body, lower body and core) performing 10-15 repetitions (or near fatigue) each exercise. Start at half the above goal but build slowly to reach the above goals. If limited by weight, joint pain, or disability, we recommend daily walking in a swimming pool with water up to waist to reduce pressure from joints while allow for adequate exercise.    BLOOD GLUCOSES Monitoring your blood glucoses is important for continued management of your diabetes. Please check your blood glucoses 2-4 times a day: fasting, before meals and at bedtime (you can rotate these measurements - e.g. one day check before the 3 meals, the next day check before 2 of the meals and before bedtime, etc.).   HYPOGLYCEMIA (low blood sugar) Hypoglycemia is usually a reaction to not eating, exercising, or taking too much insulin/ other diabetes drugs.  Symptoms include tremors, sweating, hunger, confusion, headache, etc. Treat IMMEDIATELY with 15 grams of Carbs: 4 glucose tablets  cup regular juice/soda 2 tablespoons raisins 4 teaspoons sugar 1 tablespoon honey Recheck blood glucose in 15 mins and repeat above if still  symptomatic/blood glucose <100.  RECOMMENDATIONS TO REDUCE YOUR RISK OF DIABETIC COMPLICATIONS: * Take your prescribed MEDICATION(S) * Follow a DIABETIC diet: Complex carbs, fiber rich foods, (monounsaturated and polyunsaturated) fats * AVOID saturated/trans fats, high fat foods, >2,300 mg salt per day. * EXERCISE at least 5 times a week for 30 minutes or preferably daily.  * DO NOT SMOKE OR DRINK more than 1 drink a day. * Check your FEET every day. Do not wear tightfitting shoes. Contact us if you develop an ulcer * See your EYE doctor once a year or more if needed * Get a FLU shot once a year * Get a PNEUMONIA vaccine once before and once after age 3 years  GOALS:  * Your Hemoglobin A1c of <7%  * fasting sugars need to be 80-130 * after meals sugars need to be <180 (2h after you start eating) * Your Systolic BP should be 027 or lower  * Your Diastolic BP should be 80 or lower  * Your HDL (Good Cholesterol) should be 40 or higher  * Your LDL (Bad Cholesterol) should be ideally <70. * Your Triglycerides should be 150 or lower  * Your Urine microalbumin (kidney function) should be <30 * Your Body Mass Index should be 25 or lower   Please consider the following ways to cut down carbs and fat and increase fiber and micronutrients in your diet: - substitute whole grain for white bread or pasta - substitute brown rice for white rice - substitute 90-calorie flat bread pieces for slices of bread when possible - substitute sweet potatoes or yams for white  potatoes - substitute humus for margarine - substitute tofu for cheese when possible - substitute almond or rice milk for regular milk (would not drink soy milk daily due to concern for soy estrogen influence on breast cancer risk) - substitute dark chocolate for other sweets when possible - substitute water - can add lemon or orange slices for taste - for diet sodas (artificial sweeteners will trick your body that you can eat sweets  without getting calories and will lead you to overeating and weight gain in the long run) - do not skip breakfast or other meals (this will slow down the metabolism and will result in more weight gain over time)  - can try smoothies made from fruit and almond/rice milk in am instead of regular breakfast - can also try old-fashioned (not instant) oatmeal made with almond/rice milk in am - order the dressing on the side when eating salad at a restaurant (pour less than half of the dressing on the salad) - eat as little meat as possible - can try juicing, but should not forget that juicing will get rid of the fiber, so would alternate with eating raw veg./fruits or drinking smoothies - use as little oil as possible, even when using olive oil - can dress a salad with a mix of balsamic vinegar and lemon juice, for e.g. - use agave nectar, stevia sugar, or regular sugar rather than artificial sweateners - steam or broil/roast veggies  - snack on veggies/fruit/nuts (unsalted, preferably) when possible, rather than processed foods - reduce or eliminate aspartame in diet (it is in diet sodas, chewing gum, etc) Read the labels!  Try to read Dr. Janene Harvey book: "Program for Reversing Diabetes" for other ideas for healthy eating.

## 2022-03-18 NOTE — Progress Notes (Signed)
Patient ID: [name], male   DOB: [birth year], 76 y.o.   MRN: [id number]  HPI: ADONTE VANRIPER Sr. is a 76 y.o.-year-old male, returning for follow-up for DM2, dx in 2004, insulin-dependent since 2013, uncontrolled, with complications (CAD - s/p AMI, s/p CABG, PAD - s/p angioplasty, A fib, CKD, DR, PN). Pt. previously saw Dr. Loanne Drilling, last visit 7 months ago.  His wife died 1 mo ago. He is grieving and under a lot of stress.  He has not been able to eat well-maybe 1 meal a day.  Reviewed HbA1c: Lab Results  Component Value Date   HGBA1C 8.3 (A) 08/20/2021   HGBA1C 8.1 (A) 06/04/2021   HGBA1C 9.1 (A) 12/18/2020   HGBA1C 8.1 (H) 08/16/2020   HGBA1C 8.6 (H) 02/09/2020   HGBA1C 7.6 (H) 07/08/2019   HGBA1C 8.2 (H) 01/06/2019   HGBA1C 8.1 (H) 09/29/2018   HGBA1C 7.7 (A) 05/20/2018   HGBA1C 8.0 (A) 02/17/2018   Pt is on a regimen of: -  - PCP started him on this but he did not start - Novolog 30 units 3x a day with meals, including in the middle of the night He was on Lantus before, sugars were better controlled then.  Pt checks his sugars >4x a day and they are:  Lowest sugar was >100; he has hypoglycemia awareness at 100.  Highest sugar was 200s.  Glucometer: One Touch Verio  - + CKD, last BUN/creatinine:  Lab Results  Component Value Date   BUN 18 12/18/2021   BUN 22 07/25/2021   CREATININE 1.41 12/18/2021   CREATININE 1.44 (H) 07/25/2021  On Cozaar 50 mg daily.  - + HL; last set of lipids: Lab Results  Component Value Date   CHOL 156 09/25/2021   HDL 37.80 (L) 09/25/2021   LDLCALC 94 09/25/2021   LDLDIRECT 173.0 07/01/2018   TRIG 120.0 09/25/2021   CHOLHDL 4 09/25/2021  On Livalo 1 mg daily, Zetia 10 mg daily.  - last eye exam was in 10/25/2021. No DR.   - no numbness and tingling in his feet.  Last foot exam was 02/20/2021.  ROS: + see HPI No increased urination, blurry vision, nausea, chest pain.  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   Tobacco abuse    Past Surgical History:  Procedure Laterality Date   CORONARY ARTERY BYPASS GRAFT N/A 10/01/2018   Procedure: CORONARY ARTERY BYPASS GRAFTING (CABG) TIMES  FOUR USING LEFT MAMMARY ARTERY AND RIGHT GREATER SAPHEANOUS VEIN HARVESTED ENDOSCOPICALLY;  Surgeon: Melrose Nakayama, MD;  Location: [facility];  Service: Open Heart Surgery;  Laterality: N/A;   HERNIA REPAIR     >10 years ago   IR ANGIOGRAM EXTREMITY LEFT  06/16/2020   IR RADIOLOGIST EVAL & MGMT  12/24/2016   IR RADIOLOGIST EVAL & MGMT  05/30/2020   IR RADIOLOGIST EVAL & MGMT  07/13/2020   IR RADIOLOGIST EVAL & MGMT  09/06/2020   IR RADIOLOGIST EVAL & MGMT  12/26/2020   IR THORACENTESIS ASP PLEURAL SPACE W/IMG GUIDE  10/06/2018   IR US GUIDE VASC ACCESS RIGHT  06/16/2020   LEFT HEART CATH  AND CORONARY ANGIOGRAPHY N/A 09/29/2018   Procedure: LEFT HEART CATH AND CORONARY ANGIOGRAPHY;  Surgeon: Troy Sine, MD;  Location: [facility];  Service: Cardiovascular;  Laterality: N/A;   open heart surfery     right shoulder surgery     around 2014   TEE WITHOUT CARDIOVERSION N/A 10/01/2018   Procedure: TRANSESOPHAGEAL ECHOCARDIOGRAM (TEE);  Surgeon: Melrose Nakayama, MD;  Location: [facility];  Service: Open Heart Surgery;  Laterality: N/A;   Social History   Socioeconomic History   Marital status: Married    Spouse name: Not on file   Number of children: Not on file   Years of education: Not on file   Highest education level: Not on file  Occupational History   Not on file  Tobacco Use   Smoking status: Former    Packs/day: 0.75    Years: 2.00    Total pack years: 1.50    Types: Cigarettes    Start date: 2002    Quit  date: 04/29/2002    Years since quitting: 19.8   Smokeless tobacco: Never   Tobacco comments:    smoked for 10 years on and off  Substance and Sexual Activity   Alcohol use: Yes    Comment: occasionally   Drug use: No   Sexual activity: Yes  Other Topics Concern   Not on file  Social History Narrative   Married. 3 kids. 7 grandkids. No greatgrandkids.       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     azelastine (ASTELIN) 0.1 % nasal spray Place 1 spray into both nostrils 2 (two) times daily. 30 mL 12   clopidogrel (PLAVIX) 75 MG tablet Take 1 tablet (75 mg total) by mouth daily. 90 tablet 3   Continuous Blood Gluc Receiver (FREESTYLE LIBRE 2 READER) DEVI See admin instructions.     Continuous Blood Gluc Sensor (FREESTYLE LIBRE 2 SENSOR) MISC 1 Device by Does not apply route every 14 (fourteen) days. 6 each 0   empagliflozin (JARDIANCE) 10 MG TABS tablet Take 1 tablet (10 mg total) by mouth daily before breakfast. 90 tablet 3   ezetimibe (ZETIA) 10 MG tablet Take 1 tablet (10 mg total) by mouth daily. 90 tablet 3   fluticasone (FLONASE) 50 MCG/ACT nasal spray Place 2 sprays into both nostrils daily as needed for allergies or rhinitis.     Fluticasone-Umeclidin-Vilant (TRELEGY ELLIPTA) 100-62.5-25 MCG/ACT AEPB Inhale 1 puff into the lungs daily. 1 each 0   insulin aspart (NOVOLOG FLEXPEN) 100 UNIT/ML FlexPen Inject 30-35 Units into the skin 3 (three) times daily with meals. 3 times a day (just  before each meal), 30-35-35 units, and pen needles 3/day 105 mL 0   Insulin Pen Needle 32G X 6 MM MISC Use as instructed to administer insulin 5X daily 500 each 0   losartan (COZAAR) 50 MG tablet Take 1 tablet (50 mg total) by mouth in the morning and  at bedtime. 180 tablet 3   metoprolol succinate (TOPROL-XL) 50 MG 24 hr tablet Take 1.5 tablets (75 mg total) by mouth at bedtime. Take with or immediately following a meal. 90 tablet 5   montelukast (SINGULAIR) 10 MG tablet Take 1 tablet (10 mg total) by mouth at bedtime. 30 tablet 11   ONETOUCH VERIO test strip CHECK LEVELS 4 TIMES DAILY 100 strip 2   Pitavastatin Calcium (LIVALO) 1 MG TABS Take 1 tablet (1 mg total) by mouth daily. 90 tablet 3   torsemide (DEMADEX) 20 MG tablet MUST MAKE APPOINTMENT FOR FUTURE REFILL LAST ATTEMPT. TAKE 2 TABLETS BY MOUTH EVERY DAY 30 tablet 0   [DISCONTINUED] DAPTOmycin (CUBICIN) 500 MG injection Inject into the vein. (Patient not taking: Reported on 10/22/2020)     [DISCONTINUED] DAPTOmycin 350 MG SOLR Inject into the vein. (Patient not taking: No sig reported)     No current facility-administered medications on file prior to visit.   Allergies  Allergen Reactions   Simvastatin Diarrhea   Statins Other (See Comments)    achey joints   Xanax [Alprazolam] Anxiety   Family History  Problem Relation Age of Onset   Hypertension Mother    Heart disease Mother        CHF did not see doctor   Diabetes Maternal Grandmother    Diabetes Son    PE: BP 130/80 (BP Location: Right Arm, Patient Position: Sitting, Cuff Size: Normal)   Pulse 78   Ht 6' (1.829 m)   Wt 271 lb 12.8 oz (123.3 kg)   SpO2 97%   BMI 36.86 kg/m  Wt Readings from Last 3 Encounters:  03/18/22 271 lb 12.8 oz (123.3 kg)  12/18/21 279 lb 12.8 oz (126.9 kg)  12/03/21 276 lb 6.4 oz (125.4 kg)   Constitutional: overweight, in NAD Eyes:  EOMI, no exophthalmos ENT: no neck masses, no cervical lymphadenopathy Cardiovascular: RRR, No  MRG Respiratory: + B crackles Musculoskeletal: no deformities Skin:no rashes Neurological: no tremor with outstretched hands  ASSESSMENT: 1. DM2, insulin-dependent, uncontrolled, with complications - CAD - s/p AMI, s/p CABG - PAD - s/p angioplasty 02.2022 - A fib - CKD - DR - PN; h/o osteomyelitis L 5th toe  2. HL  PLAN:  1. Patient with long-standing, uncontrolled diabetes, on rapid acting insulin only, with still poor control.  Last HbA1c was 8.3% at last visit with Dr. Loanne Drilling, 7 months ago.  At today's visit, HbA1c is 8.9% (higher). CGM interpretation: -At today's visit, we reviewed his CGM downloads: It appears that 23% of values are in target range (goal >70%), while 77% are higher than 180 (goal <25%), and 0% are lower than 70 (goal <4%).  The calculated average blood sugar is 218.  The projected HbA1c for the next 3 months (GMI) is 8.5%. -Reviewing the CGM trends, sugars are elevated at all times of the day, higher after lunch and dinner.  Upon questioning, he is having to wake up in the middle of the night to get himself NovoLog, otherwise, he will wake up with sugars in the 200s.  He noticed that sugars are gradually increasing during the night.  He is not eating well.  He usually takes his NovoLog 1 to 1.5 hours after he started eating discussed about the importance of taking it approximately 15 minutes before each meal. -With the sugars being uniformly high throughout the day, we discussed that he definitely needs long-acting insulin.  He strongly agrees.  He mentions that he had some  Lantus at home and was taking it before he saw better results.  He brought this up to Dr. Loanne Drilling before but he did not feel that he needed long-acting insulin because he had hypoglycemia at one point after developing C. difficile colitis and had Lantus on board and could not eat.  Since this incident since are quite rare, we discussed that if he cannot eat he still needs to drink fluids with carbs,  but otherwise, we will go ahead and start a low-dose long-acting insulin and advised him how to titrate it off.  He would prefer to have his blood sugars around 150, not lower, as he does not feel good when they are lower than that. -We also discussed about Jardiance.  He mentions that PCP recommended this but he was reticent to start it as he did not feel that he needed it.  At this visit, I also recommended this SGLT2 inhibitor.  We discussed about benefits of Jardiance in not only lowering blood sugars but also improving cardiovascular and renal outcomes.  We also discussed about possible side effects of increased urination and need to stay well-hydrated while on this.  He agrees to start it.  He did check on the price before and it was $30, which he is willing to take. - I suggested to:  Patient Instructions  Please start: - Jardiance 10 mg before breakfast - Lantus 20 units at bedtime (start at 15 unit)  Decrease: - Novolog 20-25 units 15 min before the 3 meals  Please return in 3-4 months with your sugar log.   - check sugars at different times of the day - check 4x a day, rotating checks - discussed about CBG targets for treatment: 80-130 mg/dL before meals and <180 mg/dL after meals; target HbA1c <7%. - given foot care handout  - given instructions for hypoglycemia management "15-15 rule"  - advised for yearly eye exams  - will check a foot exam at next visit.  He has history of osteomyelitis of his left toe, but this improved after his angioplasty procedure. - Return to clinic in 3-4 months  2. HL - Reviewed latest lipid panel from 6 months ago: LDL above our target of less than 55 due to history of cardiovascular disease, HDL low, triglycerides at goal: Lab Results  Component Value Date   CHOL 156 09/25/2021   HDL 37.80 (L) 09/25/2021   LDLCALC 94 09/25/2021   LDLDIRECT 173.0 07/01/2018   TRIG 120.0 09/25/2021   CHOLHDL 4 09/25/2021  - Continues to Livalo 1 mg daily and Zetia  10 mg daily without side effects.  - Total time spent for the visit: 40 min, in precharting, reviewing Dr. Cordelia Pen last note, obtaining medical information from the chart and from the pt, reviewing his  previous labs, evaluations, and treatments, reviewing his symptoms, counseling him about his diabetes (please see the discussed topics above), and developing a plan to further treat it; this time does not include reviewing and interpreting his CGM tracings; he had a number of questions which I addressed   Philemon Kingdom, MD PhD [facility] Endocrinology

## 2022-03-20 ENCOUNTER — Other Ambulatory Visit: Payer: Self-pay | Admitting: Family Medicine

## 2022-04-03 ENCOUNTER — Other Ambulatory Visit: Payer: Self-pay

## 2022-04-03 ENCOUNTER — Emergency Department (HOSPITAL_COMMUNITY): Payer: PPO

## 2022-04-03 ENCOUNTER — Emergency Department (HOSPITAL_COMMUNITY)
Admission: EM | Admit: 2022-04-03 | Discharge: 2022-04-03 | Disposition: A | Discharge status: Home / Self Care | Payer: PPO | Attending: Emergency Medicine | Admitting: Emergency Medicine

## 2022-04-03 ENCOUNTER — Encounter (HOSPITAL_COMMUNITY): Payer: Self-pay

## 2022-04-03 DIAGNOSIS — Z79899 Other long term (current) drug therapy: Secondary | ICD-10-CM | POA: Diagnosis not present

## 2022-04-03 DIAGNOSIS — J9811 Atelectasis: Secondary | ICD-10-CM | POA: Diagnosis not present

## 2022-04-03 DIAGNOSIS — R1013 Epigastric pain: Secondary | ICD-10-CM | POA: Insufficient documentation

## 2022-04-03 DIAGNOSIS — R0602 Shortness of breath: Secondary | ICD-10-CM | POA: Insufficient documentation

## 2022-04-03 DIAGNOSIS — Z794 Long term (current) use of insulin: Secondary | ICD-10-CM | POA: Diagnosis not present

## 2022-04-03 DIAGNOSIS — Z7982 Long term (current) use of aspirin: Secondary | ICD-10-CM | POA: Diagnosis not present

## 2022-04-03 DIAGNOSIS — I2510 Atherosclerotic heart disease of native coronary artery without angina pectoris: Secondary | ICD-10-CM | POA: Diagnosis not present

## 2022-04-03 DIAGNOSIS — I10 Essential (primary) hypertension: Secondary | ICD-10-CM | POA: Diagnosis not present

## 2022-04-03 DIAGNOSIS — R197 Diarrhea, unspecified: Secondary | ICD-10-CM | POA: Diagnosis not present

## 2022-04-03 DIAGNOSIS — Z951 Presence of aortocoronary bypass graft: Secondary | ICD-10-CM | POA: Diagnosis not present

## 2022-04-03 DIAGNOSIS — E1165 Type 2 diabetes mellitus with hyperglycemia: Secondary | ICD-10-CM | POA: Insufficient documentation

## 2022-04-03 DIAGNOSIS — R079 Chest pain, unspecified: Secondary | ICD-10-CM

## 2022-04-03 DIAGNOSIS — R0789 Other chest pain: Secondary | ICD-10-CM | POA: Diagnosis not present

## 2022-04-03 DIAGNOSIS — Z1152 Encounter for screening for COVID-19: Secondary | ICD-10-CM | POA: Diagnosis not present

## 2022-04-03 DIAGNOSIS — R112 Nausea with vomiting, unspecified: Secondary | ICD-10-CM

## 2022-04-03 DIAGNOSIS — R739 Hyperglycemia, unspecified: Secondary | ICD-10-CM

## 2022-04-03 DIAGNOSIS — R1111 Vomiting without nausea: Secondary | ICD-10-CM | POA: Diagnosis not present

## 2022-04-03 LAB — CBC WITH DIFFERENTIAL/PLATELET
Abs Immature Granulocytes: 0.04 10*3/uL (ref 0.00–0.07)
Basophils Absolute: 0 10*3/uL (ref 0.0–0.1)
Basophils Relative: 0 %
Eosinophils Absolute: 0.1 10*3/uL (ref 0.0–0.5)
Eosinophils Relative: 2 %
HCT: 40 % (ref 39.0–52.0)
Hemoglobin: 13.1 g/dL (ref 13.0–17.0)
Immature Granulocytes: 1 %
Lymphocytes Relative: 21 %
Lymphs Abs: 1.6 10*3/uL (ref 0.7–4.0)
MCH: 30.5 pg (ref 26.0–34.0)
MCHC: 32.8 g/dL (ref 30.0–36.0)
MCV: 93 fL (ref 80.0–100.0)
Monocytes Absolute: 0.7 10*3/uL (ref 0.1–1.0)
Monocytes Relative: 9 %
Neutro Abs: 5.3 10*3/uL (ref 1.7–7.7)
Neutrophils Relative %: 67 %
Platelets: 256 10*3/uL (ref 150–400)
RBC: 4.3 MIL/uL (ref 4.22–5.81)
RDW: 13.2 % (ref 11.5–15.5)
WBC: 7.8 10*3/uL (ref 4.0–10.5)
nRBC: 0 % (ref 0.0–0.2)

## 2022-04-03 LAB — TROPONIN I (HIGH SENSITIVITY)
Troponin I (High Sensitivity): 6 ng/L (ref <18)
Troponin I (High Sensitivity): 6 ng/L (ref <18)

## 2022-04-03 LAB — RESP PANEL BY RT-PCR (FLU A&B, COVID) ARPGX2
Influenza A by PCR: NEGATIVE
Influenza B by PCR: NEGATIVE
SARS Coronavirus 2 by RT PCR: NEGATIVE

## 2022-04-03 LAB — BASIC METABOLIC PANEL
Anion gap: 10 (ref 5–15)
BUN: 28 mg/dL — ABNORMAL HIGH (ref 8–23)
CO2: 22 mmol/L (ref 22–32)
Calcium: 8.4 mg/dL — ABNORMAL LOW (ref 8.9–10.3)
Chloride: 99 mmol/L (ref 98–111)
Creatinine, Ser: 1.53 mg/dL — ABNORMAL HIGH (ref 0.61–1.24)
GFR, Estimated: 47 mL/min — ABNORMAL LOW (ref >60)
Glucose, Bld: 294 mg/dL — ABNORMAL HIGH (ref 70–99)
Potassium: 3.9 mmol/L (ref 3.5–5.1)
Sodium: 131 mmol/L — ABNORMAL LOW (ref 135–145)

## 2022-04-03 MED ORDER — INSULIN ASPART 100 UNIT/ML IJ SOLN: 10.0000 [IU] | Freq: Once | INTRAMUSCULAR | Status: DC

## 2022-04-03 MED ORDER — ONDANSETRON 4 MG PO TBDP: 4.0000 mg | ORAL_TABLET | Freq: Three times a day (TID) | ORAL | 0 refills | Status: DC | PRN

## 2022-04-03 MED ORDER — ONDANSETRON 4 MG PO TBDP
4.0000 mg | ORAL_TABLET | Freq: Once | ORAL | Status: DC
  Filled 2022-04-03: qty 1

## 2022-04-03 MED ORDER — ASPIRIN 81 MG PO CHEW: 324.0000 mg | CHEWABLE_TABLET | Freq: Once | ORAL | Status: DC

## 2022-04-03 MED ORDER — SODIUM CHLORIDE 0.9 % IV BOLUS
500.0000 mL | Freq: Once | INTRAVENOUS | Status: AC
  Administered 2022-04-03: 500 mL via INTRAVENOUS

## 2022-04-03 NOTE — Discharge Instructions (Signed)
Please follow-up on your COVID and flu test results this afternoon on your patient portal.  Schedule follow-up appointment with your primary care doctor and/or your endocrinologist for your high blood sugars and your nausea symptoms.  *  You were diagnosed with chest pain today.  This is a non-specific diagnosis, but your provider did not feel this was a life-threatening condition at this time.  Chest pain is a common presenting condition in the Emergency Department, and it is not uncommon for patients to leave without a specific diagnosis.  It is important to remember that your workup today was not a complete medical workup.  You may still have a serious medical attention that needs follow up care with a specialist. If your provider referred you to see a specialist, it is VERY important that you call to set up an appointment with them.    It is also important that you speak to your primary care provider in 1-2 days after this visit to the ER.  Your PCP may want to see you in the office, or else they may want you to follow up with the specialist.  [place] IF:  You have severe chest pain, especially if the pain is crushing or pressure-like and spreads to the arms, back, neck, or jaw, or if you have sweating, nausea (feeling sick to your stomach), or shortness of breath. THIS IS AN EMERGENCY. Don't wait to see if the pain will go away. Get medical help at once. Call 911 or 0 (operator). DO NOT drive yourself to the hospital.   Your chest pain gets worse and does not go away with rest.  You have an attack of chest pain lasting longer than usual, despite rest and treatment with the medications your caregiver has prescribed.  You wake from sleep with chest pain or shortness of breath.  You feel dizzy or faint.  You have chest pain not typical of your usual pain for which you originally saw your caregiver.

## 2022-04-03 NOTE — ED Provider Triage Note (Signed)
Emergency Medicine Provider Triage Evaluation Note  [place]. , a 76 y.o. male with hx of CAD s/p CABG in 2020, Afib, CKDIII, DMII, HTN was evaluated in triage.  Pt complains of central chest tightness, onset around 0300. Pain initially 8/10 > given ASA with improvement to 5/10. Pain improved further with SL NTG down to 3/10 > 1/10. Had some associated SOB. No DOE, diaphoresis, jaw pain, back pain. Symptoms feel different from his prior heart attack.  Wife passed away 1 month ago. Has been under increased stress. Also noncompliant with his daily medications x 1 week due to nausea.  Review of Systems  Positive: As above Negative: As above  Physical Exam  BP (!) 150/72 (BP Location: Right Arm)   Pulse 70   Temp 98.9 F (37.2 C) (Oral)   Resp 12   SpO2 99%  Gen:   Awake, no distress   Resp:  Normal effort. Lungs CTAB. MSK:   Moves extremities without difficulty Other:  Abdomen soft, obese.  Medical Decision Making  Medically screening exam initiated at 5:09 AM.  Appropriate orders placed.  [place]. was informed that the remainder of the evaluation will be completed by another provider, this initial triage assessment does not replace that evaluation, and the importance of remaining in the ED until their evaluation is complete.  Undifferentiated chest pain - work up initiated.   Antonietta Breach, PA-C 04/03/22 [phone]

## 2022-04-03 NOTE — ED Triage Notes (Signed)
Pt arrived by EMS complaining of chest pain  Had been having an upset stomach and minimal appetite last week  Pt woke up with central chest pain  Took his home aspirin, pain decreased from 5 to a 3  EMS gave 2 nitro tablets and '4mg'$  Zofran by 20 gauge L. AC  Hx MI in 2020 and chronic cough since COVID 2 years ago    Pt has not been taking his insulin per usual do to GI upset  Pt recently lost his wife last month and has been having a hard time dealing with it, including increased anxiety and depression

## 2022-04-03 NOTE — ED Provider Notes (Signed)
[facility] EMERGENCY DEPARTMENT Provider Note   CSN: [id number] Arrival date & time: 04/03/22  0457     History  CC: N/v/diarrhea   Martin Agee Sr. is a 76 y.o. male w/ hx of coronary disease status post CABG, diabetes on insulin, presenting to emergency department with complaint of nausea/vomiting/diarrhea.  Patient reports he has had stomach upset for about 4 days.  He had a lot of diarrhea 2 days ago and took Imodium which has helped.  He is continued feel nauseated, had some epigastric and substernal pressure earlier this morning after waking up to use the bathroom, subsequently called EMS.  They gave the patient aspirin nitroglycerin and his pain has improved.  He reports that he did have a lot of GI symptoms earlier this week.  He does have a history of reflux.  He says his blood sugars tend to average around 230's, takes 5-10 units insulin w/ meals, 10-20 units lantus at night.  He skipped his insulin with meals because he was not eating due to nausea.  HPI     Home Medications Prior to Admission medications   Medication Sig Start Date End Date Taking? Authorizing Provider  aspirin 81 MG EC tablet Take 1 tablet (81 mg total) by mouth daily. 02/04/19  Yes Troy Sine, MD  azelastine (ASTELIN) 0.1 % nasal spray Place 1 spray into both nostrils 2 (two) times daily. 08/16/20  Yes Marin Olp, MD  clopidogrel (PLAVIX) 75 MG tablet Take 1 tablet (75 mg total) by mouth daily. 05/23/21  Yes Marin Olp, MD  ezetimibe (ZETIA) 10 MG tablet Take 1 tablet (10 mg total) by mouth daily. 12/18/21  Yes Marin Olp, MD  fluticasone [facility]) 50 MCG/ACT nasal spray Place 2 sprays into both nostrils daily as needed for allergies or rhinitis.   Yes [provider]  insulin aspart (NOVOLOG FLEXPEN) 100 UNIT/ML FlexPen Inject 20-25 Units into the skin 3 (three) times daily with meals. 3 times a day (just before each meal), 30-35-35 units, and pen needles  3/day 03/18/22  Yes Philemon Kingdom, MD  insulin glargine (LANTUS SOLOSTAR) 100 UNIT/ML Solostar Pen Inject 20 Units into the skin at bedtime. Patient taking differently: Inject 10 Units into the skin at bedtime. 03/18/22  Yes Philemon Kingdom, MD  losartan (COZAAR) 50 MG tablet TAKE 1 TABLET (50 MG TOTAL) BY MOUTH IN THE MORNING AND AT BEDTIME. 03/20/22  Yes Marin Olp, MD  metoprolol succinate (TOPROL-XL) 50 MG 24 hr tablet Take 1.5 tablets (75 mg total) by mouth at bedtime. Take with or immediately following a meal. Patient taking differently: Take 75 mg by mouth 2 (two) times daily. Take with or immediately following a meal. 03/12/21  Yes Troy Sine, MD  montelukast (SINGULAIR) 10 MG tablet Take 1 tablet (10 mg total) by mouth at bedtime. 12/03/21  Yes Freddi Starr, MD  Pitavastatin Calcium (LIVALO) 1 MG TABS Take 1 tablet (1 mg total) by mouth daily. 03/12/21  Yes Troy Sine, MD  torsemide (DEMADEX) 20 MG tablet MUST MAKE APPOINTMENT FOR FUTURE REFILL LAST ATTEMPT. TAKE 2 TABLETS BY MOUTH EVERY DAY Patient taking differently: Take 40 mg by mouth daily as needed (For fluid). 12/01/20  Yes Troy Sine, MD  Continuous Blood Gluc Receiver (FREESTYLE LIBRE 2 READER) [place] See admin instructions. 12/18/20   [provider]  Continuous Blood Gluc Sensor (FREESTYLE LIBRE 2 SENSOR) MISC 1 Device by Does not apply route every 14 (  fourteen) days. 03/18/22   Philemon Kingdom, MD  empagliflozin (JARDIANCE) 10 MG TABS tablet Take 1 tablet (10 mg total) by mouth daily before breakfast. Patient not taking: Reported on 04/03/2022 03/18/22   Philemon Kingdom, MD  Fluticasone-Umeclidin-Vilant (TRELEGY ELLIPTA) 100-62.5-25 MCG/ACT AEPB Inhale 1 puff into the lungs daily. Patient not taking: Reported on 04/03/2022 12/03/21   Freddi Starr, MD  Insulin Pen Needle 32G X 6 MM MISC Use as instructed to administer insulin 5X daily 11/29/21   Philemon Kingdom, MD  [facility] VERIO test  strip CHECK LEVELS 4 TIMES DAILY 10/20/20   Renato Shin, MD  DAPTOmycin (CUBICIN) 500 MG injection Inject into the vein. Patient not taking: Reported on 10/22/2020 08/23/20   [provider]  DAPTOmycin 350 MG SOLR Inject into the vein. Patient not taking: No sig reported    [provider]      Allergies    Jardiance [empagliflozin], Simvastatin, Statins, and Xanax [alprazolam]    Review of Systems   Review of Systems  Physical Exam Updated Vital Signs BP (!) 154/89 (BP Location: Right Arm)   Pulse 69   Temp 98.3 F (36.8 C) (Oral)   Resp 15   Ht 6' (1.829 m)   Wt 100 kg   SpO2 98%   BMI 29.90 kg/m  Physical Exam Constitutional:      General: He is not in acute distress. HENT:     Head: Normocephalic and atraumatic.  Eyes:     Conjunctiva/sclera: Conjunctivae normal.     Pupils: Pupils are equal, round, and reactive to light.  Cardiovascular:     Rate and Rhythm: Normal rate and regular rhythm.  Pulmonary:     Effort: Pulmonary effort is normal. No respiratory distress.  Abdominal:     General: There is no distension.     Tenderness: There is no abdominal tenderness.  Skin:    General: Skin is warm and dry.  Neurological:     General: No focal deficit present.     Mental Status: He is alert. Mental status is at baseline.  Psychiatric:        Mood and Affect: Mood normal.        Behavior: Behavior normal.     ED Results / Procedures / Treatments   Labs (all labs ordered are listed, but only abnormal results are displayed) Labs Reviewed  BASIC METABOLIC PANEL - Abnormal; Notable for the following components:      Result Value   Sodium 131 (*)    Glucose, Bld 294 (*)    BUN 28 (*)    Creatinine, Ser 1.53 (*)    Calcium 8.4 (*)    GFR, Estimated 47 (*)    All other components within normal limits  RESP PANEL BY RT-PCR (FLU A&B, COVID) ARPGX2  CBC WITH DIFFERENTIAL/PLATELET  TROPONIN I (HIGH SENSITIVITY)  TROPONIN I (HIGH SENSITIVITY)     EKG EKG Interpretation  Date/Time:  Wednesday April 03 2022 05:22:25 EST Ventricular Rate:  74 PR Interval:  154 QRS Duration: 86 QT Interval:  374 QTC Calculation: 415 R Axis:   75 Text Interpretation: Normal sinus rhythm Normal ECG When compared with ECG of 20-Jul-2019 12:43, No significant change was found Confirmed by Delora Fuel ([id number]) on 04/03/2022 5:27:50 AM  Radiology DG Chest 2 View  Result Date: 04/03/2022 CLINICAL DATA:  76 year old male with chest pain. Woke with central pain. EXAM: CHEST - 2 VIEW COMPARISON:  Chest radiographs 06/30/2020 and earlier. FINDINGS: PA and lateral views  at 0524 hours. Chronic median sternotomy, CABG. Chronic streaky and platelike opacity at the right lung base, most resembles atelectasis on chest CT in 2021. Mild contralateral left lingula and lower lobe atelectasis and/or scarring at that time. No pneumothorax, pulmonary edema, pleural effusion, or acute pulmonary opacity. Mediastinal contours remain within normal limits. Visualized tracheal air column is within normal limits. No acute osseous abnormality identified. Negative visible bowel gas. IMPRESSION: Chronic lung base atelectasis and/or scarring, greater on the right. Prior CABG. No acute cardiopulmonary abnormality. Electronically Signed   By: Genevie Ann M.D.   On: 04/03/2022 05:37    Procedures Procedures    Medications Ordered in ED Medications  insulin aspart (novoLOG) injection 10 Units (has no administration in time range)  ondansetron (ZOFRAN-ODT) disintegrating tablet 4 mg (4 mg Oral Not Given 04/03/22 1414)  sodium chloride 0.9 % bolus 500 mL (500 mLs Intravenous New Bag/Given 04/03/22 1415)    ED Course/ Medical Decision Making/ A&P Clinical Course as of 04/03/22 1537  Wed Apr 03, 2022  1537 Patient did not want insulin or Zofran here.  He says he will manage his own blood sugar at home.  I will prescribe Zofran for home use which she is agreeable to, and he is requesting a  COVID and flu swab before he leaves which we can send and he will follow-up on.  Okay for discharge [MT]    Clinical Course User Index [MT] Raelyn Racette, Carola Rhine, MD                           Medical Decision Making Risk Prescription drug management.   This patient presents to the ED with concern for nausea, vomiting, chest discomfort, diarrhea. This involves an extensive number of treatment options, and is a complaint that carries with it a high risk of complications and morbidity.  The differential diagnosis includes viral gastroenteritis versus viral illness versus foodborne illness versus atypical ACS versus other  Co-morbidities that complicate the patient evaluation: Cardiovascular risk factors, HTN, diabetes  External records from outside source obtained and reviewed including cardiology office eval for CAD  I ordered and personally interpreted labs.  The pertinent results include: Initial troponin negative, he is hyperglycemic with blood sugar 290s, no elevated anion gap, no sign of DKA  I ordered imaging studies including xray chest I independently visualized and interpreted imaging which showed no acute abnormalities I agree with the radiologist interpretation  The patient was maintained on a cardiac monitor.  I personally viewed and interpreted the cardiac monitored which showed an underlying rhythm of: NSR  Per my interpretation the patient's ECG shows no acute ischemia  I ordered medication including zofran for nausea  I have reviewed the patients home medicines and have made adjustments as needed  Test Considered: doubt acute PE, intraabdominal perforation or infection  After the interventions noted above, I reevaluated the patient and found that they have: improved  Dispostion:  After consideration of the diagnostic results and the patients response to treatment, I feel that the patent would benefit from outpatient pcp and endocrinology f/u.         Final  Clinical Impression(s) / ED Diagnoses Final diagnoses:  Nausea vomiting and diarrhea  Chest pain, unspecified type  Hyperglycemia    Rx / DC Orders ED Discharge Orders     None         Wyvonnia Dusky, MD 04/03/22 1531

## 2022-04-04 ENCOUNTER — Other Ambulatory Visit: Payer: Self-pay | Admitting: Internal Medicine

## 2022-04-04 ENCOUNTER — Encounter: Payer: Self-pay | Admitting: Internal Medicine

## 2022-04-04 DIAGNOSIS — E1151 Type 2 diabetes mellitus with diabetic peripheral angiopathy without gangrene: Secondary | ICD-10-CM

## 2022-04-08 ENCOUNTER — Ambulatory Visit: Payer: PPO | Admitting: Pulmonary Disease

## 2022-04-08 ENCOUNTER — Telehealth: Payer: Self-pay | Admitting: Family Medicine

## 2022-04-08 NOTE — Telephone Encounter (Signed)
Patient states: Martin [name] to ED on 12/06 and was recommended to follow up with PCP  - Since ED visit his BP has been all over the place, believes this is due to the stress of just losing his wife a month ago   Upon looking at ED AVS pt was meant to follow up with PCP with 3 days of ED visit. Patient has been scheduled for next available visit with PCP on 12/15 @ 4pm. Can pt be worked in sooner?

## 2022-04-08 NOTE — Telephone Encounter (Signed)
If he is making progress at home- lets keep visit on 15th- have him bring log of blood pressures

## 2022-04-08 NOTE — Telephone Encounter (Signed)
See below

## 2022-04-09 NOTE — Telephone Encounter (Signed)
Called and pt vm has not been setup yet, if pt calls back please give below message.

## 2022-04-11 ENCOUNTER — Encounter: Payer: Self-pay | Admitting: *Deleted

## 2022-04-12 ENCOUNTER — Encounter: Payer: Self-pay | Admitting: Family Medicine

## 2022-04-12 ENCOUNTER — Other Ambulatory Visit: Payer: Self-pay | Admitting: Family Medicine

## 2022-04-12 ENCOUNTER — Other Ambulatory Visit: Payer: Self-pay | Admitting: Cardiovascular Disease

## 2022-04-12 ENCOUNTER — Ambulatory Visit (INDEPENDENT_AMBULATORY_CARE_PROVIDER_SITE_OTHER): Payer: PPO | Admitting: Family Medicine

## 2022-04-12 VITALS — BP 144/70 | HR 81 | Temp 98.2°F | Ht 73.0 in | Wt 267.0 lb

## 2022-04-12 DIAGNOSIS — K529 Noninfective gastroenteritis and colitis, unspecified: Secondary | ICD-10-CM | POA: Diagnosis not present

## 2022-04-12 DIAGNOSIS — I2510 Atherosclerotic heart disease of native coronary artery without angina pectoris: Secondary | ICD-10-CM

## 2022-04-12 DIAGNOSIS — E1169 Type 2 diabetes mellitus with other specified complication: Secondary | ICD-10-CM

## 2022-04-12 DIAGNOSIS — Z794 Long term (current) use of insulin: Secondary | ICD-10-CM

## 2022-04-12 DIAGNOSIS — E1151 Type 2 diabetes mellitus with diabetic peripheral angiopathy without gangrene: Secondary | ICD-10-CM | POA: Diagnosis not present

## 2022-04-12 DIAGNOSIS — I739 Peripheral vascular disease, unspecified: Secondary | ICD-10-CM

## 2022-04-12 DIAGNOSIS — I10 Essential (primary) hypertension: Secondary | ICD-10-CM

## 2022-04-12 MED ORDER — NITROGLYCERIN 0.4 MG SL SUBL: 0.4000 mg | SUBLINGUAL_TABLET | SUBLINGUAL | 5 refills | Status: AC | PRN

## 2022-04-12 MED ORDER — ONDANSETRON 4 MG PO TBDP: 4.0000 mg | ORAL_TABLET | Freq: Three times a day (TID) | ORAL | 0 refills | Status: AC | PRN

## 2022-04-12 NOTE — Assessment & Plan Note (Signed)
S: medication: Losartan 50 mg BID, metoprolol 75 mg XR in evening (he reports taking it twice daily at 50 mg)  - torsemide 40 mg just as needed- but listed as daily on rx Home readings #s: variable at home since losing wife [name] - Was on hydrochlorothiazide until June 2020 at time of heart attack-taken off to allow more aggressive diuresis it appears BP Readings from Last 3 Encounters:  04/12/22 (!) 144/70  04/03/22 (!) 148/64  03/18/22 130/80  A/P: Poor control on losartan 50 mg twice daily metoprolol 75 mg extended release in the evening (though he reports taking 50 mg of metoprolol twice daily).  He has been holding his torsemide is not having much swelling but noted some swelling today in addition we discussed this could help his blood pressure and he agrees to try torsemide 20 mg daily and update me with home blood pressures next week

## 2022-04-12 NOTE — Patient Instructions (Addendum)
For pressure - restart torsemide but just once a day and update me Tuesday or Wednesday next week with home readings- hopefully improving  Can call back for flu shot once you feel a little more stable from recent issues  Sent in nitroglycerin as well as can try for zofran through goodrx  Recommended follow up: Return for next already scheduled visit or sooner if needed.

## 2022-04-12 NOTE — Progress Notes (Signed)
Phone [phone] In person visit   Subjective:   Martin NESTLE Sr. is a 76 y.o. year old very pleasant male patient who presents for/with See problem oriented charting Chief Complaint  Patient presents with   Follow-up    Pt is here to f/u from ER visit for n/v, he states he feels some better but still has some easyness and appetite is slightly coming back.   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    Priority: Medium    PAD (peripheral artery disease) ([place]) 08/20/2017    Priority: Medium    Partial tear of right Achilles tendon 02/12/2017    Priority: Medium    Aortic atherosclerosis ([place]) 10/07/2016    Priority: Medium    History of skin cancer 08/15/2015    Priority: Medium    Diabetic retinopathy ([place]) 02/21/2015    Priority: Medium    Hyperlipidemia associated with type 2 diabetes mellitus ([place]) 08/24/2014    Priority: Medium    Diabetic polyneuropathy ([place]) 04/11/2009    Priority: Medium    Essential hypertension 02/03/2007    Priority: Medium    Acute blood loss as cause of postoperative anemia 10/24/2018    Priority: Low   BPH (benign prostatic hyperplasia) 10/24/2018    Priority: Low   History of depression 10/24/2018    Priority: Low   S/P CABG x 4 10/01/2018    Priority: Low   Onychomycosis of toenail 02/17/2018    Priority: Low   Allergic rhinitis 08/20/2017    Priority: Low   C. difficile colitis 09/25/2016    Priority: Low   History of aspiration  pneumonia 09/25/2016    Priority: Low   Former smoker 08/24/2014    Priority: Low   Ingrowing toenail 07/19/2014    Priority: Low   BACK PAIN, LUMBAR, WITH RADICULOPATHY 08/30/2008    Priority: Low   Other hyperlipidemia 03/18/2022   Diabetic foot ulcer with osteomyelitis ([place]) 08/22/2021   Simple chronic bronchitis ([place]) 05/08/2020    Medications- reviewed and updated Current Outpatient Medications  Medication Sig Dispense Refill   aspirin 81 MG EC tablet Take 1 tablet (81 mg total) by mouth daily.     azelastine (ASTELIN) 0.1 % nasal spray Place 1 spray into both nostrils 2 (two) times daily. 30 mL 12   clopidogrel (PLAVIX) 75 MG tablet TAKE 1 TABLET BY MOUTH EVERY DAY 90 tablet 3   Continuous Blood Gluc Receiver (FREESTYLE LIBRE 2 READER) [place] See admin instructions.     Continuous Blood Gluc Sensor (FREESTYLE LIBRE 2 SENSOR) MISC 1 Device by Does not apply route every 14 (fourteen) days. 6 each 3   empagliflozin (JARDIANCE) 10 MG TABS tablet Take 1 tablet (10 mg total) by mouth daily before breakfast. 90 tablet 3   ezetimibe (ZETIA) 10 MG tablet Take 1 tablet (10 mg total) by mouth daily. 90 tablet 3   fluticasone (FLONASE) 50 MCG/ACT nasal  spray Place 2 sprays into both nostrils daily as needed for allergies or rhinitis.     Fluticasone-Umeclidin-Vilant (TRELEGY ELLIPTA) 100-62.5-25 MCG/ACT AEPB Inhale 1 puff into the lungs daily. 1 each 0   insulin aspart (NOVOLOG FLEXPEN) 100 UNIT/ML FlexPen Inject 20-25 Units into the skin 3 (three) times daily with meals. 3 times a day (just before each meal), 30-35-35 units, and pen needles 3/day 75 mL 3   insulin glargine (LANTUS SOLOSTAR) 100 UNIT/ML Solostar Pen Inject 20 Units into the skin at bedtime. (Patient taking differently: Inject 10 Units into the skin at bedtime.) 15 mL 3   Insulin Pen Needle (BD PEN NEEDLE NANO 2ND GEN) 32G X 4 MM MISC USE AS INSTRUCTED TO ADMINISTER INSULIN 5X DAILY 500 each 1   losartan (COZAAR) 50 MG tablet TAKE  1 TABLET (50 MG) BY MOUTH IN THE MORNING AND AT BEDTIME 180 tablet 3   metoprolol succinate (TOPROL-XL) 50 MG 24 hr tablet Take 1.5 tablets (75 mg total) by mouth at bedtime. Take with or immediately following a meal. 135 tablet 3   montelukast (SINGULAIR) 10 MG tablet Take 1 tablet (10 mg total) by mouth at bedtime. 30 tablet 11   nitroGLYCERIN (NITROSTAT) 0.4 MG SL tablet Place 1 tablet (0.4 mg total) under the tongue every 5 (five) minutes as needed for chest pain (if pain does not resolve with 2nd dose- seek care). 20 tablet 5   ONETOUCH VERIO test strip CHECK LEVELS 4 TIMES DAILY 100 strip 2   Pitavastatin Calcium (LIVALO) 1 MG TABS Take 1 tablet (1 mg total) by mouth daily. 90 tablet 3   torsemide (DEMADEX) 20 MG tablet MUST MAKE APPOINTMENT FOR FUTURE REFILL LAST ATTEMPT. TAKE 2 TABLETS BY MOUTH EVERY DAY (Patient taking differently: Take 40 mg by mouth daily as needed (For fluid).) 30 tablet 0   ondansetron (ZOFRAN-ODT) 4 MG disintegrating tablet Take 1 tablet (4 mg total) by mouth every 8 (eight) hours as needed for up to 20 doses for nausea or vomiting. 20 tablet 0   No current facility-administered medications for this visit.     Objective:  BP (!) 144/70   Pulse 81   Temp 98.2 F (36.8 C)   Ht '6\' 1"'$  (1.854 m)   Wt 267 lb (121.1 kg)   SpO2 94%   BMI 35.23 kg/m  Gen: NAD, resting comfortably CV: RRR Lungs: CTAB no crackles, wheeze, rhonchi Ext: Trace to 1+ edema Skin: warm, dry    Assessment and Plan   # ED follow up S: Patient presented to the emergency department on 04/03/2022 with complaints of nausea/vomiting/diarrhea.  Symptoms have been present for about 4 days but 2 days prior had pretty significant diarrhea which required Imodium.  He had ongoing nausea/epigastric and substernal pressure earlier that morning and decided to call EMS-he was given aspirin and nitroglycerin and pain improved.  He was given Zofran and IV fluids and requested to return home after that with  Zofran on hand.  EKG showed no acute ischemia per ED physician note.  Blood sugars were up to 290 but he declined insulin in the emergency department. Troponin trend were low risk.   Patient had some concern that symptoms could be related to [place] message 04/04/2022 mention that he had nausea and lower abdominal pain 3 days after starting Jardiance- came off 5 days and then got sick over a week later as above.   Patient also over works in light of ongoing stress after losing his  wife in tough fashion 02/24/2022. Also developed a head cold last week- tested a few days after starting and covid test was negative.   Unfortunately insurance would not cover the zofran- was unable to get this. He is finally feeling better and doesn't need it but frustrating to not have the medicine he needed when he needed it. He would like to have it on hand in case recurrence.   A/P: ED follow-up for likely gastroenteritis (nausea, vomiting, abdominal pain, diarrhea) that thankfully has essentially resolved-since he had some nausea with Jardiance prior to this he was hold off on Jardiance for now but would also like to have Zofran on hand in case recurrent issues with getting insurance coverage issues so we discussed using good Rx-printed prescription given as well as card  Patient with CAD and did require nitroglycerin with EMS to resolve substernal chest pain-he feels like this was provoked primarily by anxiety but would like to have nitroglycerin on hand I sent him a prescription-see CAD section as well  # Diabetes-managed by endocrinology Dr. Cruzita Lederer (prior Dr. Loanne Drilling) S: Medication:Lantus and NovoLog.  Held [place] as he felt like it was causing nausea and he wants to hold off for now  Lab Results  Component Value Date   HGBA1C 8.9 (A) 03/18/2022   HGBA1C 8.3 (A) 08/20/2021   HGBA1C 8.1 (A) 06/04/2021  A/P: Diabetes has been poorly controlled and I think Jardiance would be very helpful but he is  concerned relating this to some the nausea he had about a week prior to more severe nausea and vomiting and diarrhea and he at least wants to hold off for now until he feels his stomach is fully settled  #CAD-with CABG history  S: Medication: Aspirin 81 mg, Plavix 75 mg (interventional radiology has recommended continuing aspirin and Plavix for PAD as well), Zetia 10 mg, Livalo 1 mg -waiting on approval for repatha in 2023 through cardiology- still not improve -wants to have nitroglycerin on hand as was helpful when having chest pain  Lab Results  Component Value Date   CHOL 156 09/25/2021   HDL 37.80 (L) 09/25/2021   LDLCALC 94 09/25/2021   LDLDIRECT 173.0 07/01/2018   TRIG 120.0 09/25/2021   CHOLHDL 4 09/25/2021     A/P: CAD largely asymptomatic-did have episode of chest pain which resolved with nitroglycerin-we refilled this but not sure if this was more related to anxiety or actually CAD-regardless certainly reasonable for him to have medication on hand - Continue aspirin, Plavix, Zetia, Livalo-wish Repatha was covered but he is still working on [employer] like to push LDL under 70   #hypertension S: medication: Losartan 50 mg BID, metoprolol 75 mg XR in evening (he reports taking it twice daily at 50 mg)  - torsemide 40 mg just as needed- but listed as daily on rx Home readings #s: variable at home since losing wife Martin [name] BP Readings from Last 3 Encounters:  04/12/22 (!) 144/70  04/03/22 (!) 148/64  03/18/22 130/80  A/P: Poor control on losartan 50 mg twice daily metoprolol 75 mg extended release in the evening (though he reports taking 50 mg of metoprolol twice daily).  He has been holding his torsemide is not having much swelling but noted some swelling today in addition we discussed this could help his blood pressure and he agrees to try torsemide 20 mg daily and update me with home blood pressures next week  Recommended follow up: Return for next already scheduled visit or sooner  if  needed. Future Appointments  Date Time Provider [place]  05/02/2022  1:00 PM [facility] PFT ROOM [facility] None  05/02/2022  2:00 PM Freddi Starr, MD [facility] None  06/18/2022  9:00 AM Marin Olp, MD [facility] PEC  07/22/2022  8:20 AM Philemon Kingdom, MD [facility] None    Lab/Order associations:   ICD-10-CM   1. Gastroenteritis  K52.9     2. Coronary artery disease involving native coronary artery of native heart without angina pectoris  I25.10     3. Essential hypertension  I10     4. Type 2 diabetes mellitus with diabetic peripheral angiopathy without gangrene, with long-term current use of insulin (HCC)  E11.51    Z79.4       Meds ordered this encounter  Medications   ondansetron (ZOFRAN-ODT) 4 MG disintegrating tablet    Sig: Take 1 tablet (4 mg total) by mouth every 8 (eight) hours as needed for up to 20 doses for nausea or vomiting.    Dispense:  20 tablet    Refill:  0   nitroGLYCERIN (NITROSTAT) 0.4 MG SL tablet    Sig: Place 1 tablet (0.4 mg total) under the tongue every 5 (five) minutes as needed for chest pain (if pain does not resolve with 2nd dose- seek care).    Dispense:  20 tablet    Refill:  5    Time Spent: 42 minutes of total time (4:10 PM-4:52 PM) was spent on the date of the encounter performing the following actions: chart review prior to seeing the patient including ED visit, obtaining history, performing a medically necessary exam, counseling on the treatment plan and reviewing hospital course as well as patient's hospital frustrations together, placing orders, and documenting in our EHR.    Return precautions advised.  [name], MD

## 2022-04-12 NOTE — Assessment & Plan Note (Signed)
S: Medication: Aspirin 81 mg, Plavix 75 mg (interventional radiology has recommended continuing aspirin and Plavix for PAD as well), Zetia 10 mg, Livalo 1 mg -waiting on approval for repatha in 2023 through cardiology- still not improve -wants to have nitroglycerin on hand as was helpful when having chest pain  Lab Results  Component Value Date   CHOL 156 09/25/2021   HDL 37.80 (L) 09/25/2021   LDLCALC 94 09/25/2021   LDLDIRECT 173.0 07/01/2018   TRIG 120.0 09/25/2021   CHOLHDL 4 09/25/2021     A/P: CAD largely asymptomatic-did have episode of chest pain which resolved with nitroglycerin-we refilled this but not sure if this was more related to anxiety or actually CAD-regardless certainly reasonable for him to have medication on hand - Continue aspirin, Plavix, Zetia, Livalo-wish Repatha was covered but he is still working on this-would like to push LDL under 70

## 2022-04-15 ENCOUNTER — Telehealth: Payer: Self-pay

## 2022-04-15 NOTE — Telephone Encounter (Signed)
        Patient  visited [facility] on 12/6   Telephone encounter attempt :  1st  A HIPAA compliant voice message was left requesting a return call.  Instructed patient to call back    [facility], [facility]  [phone] [address], [place], [place] [address] Phone: [phone] Email: [email or web address]

## 2022-04-16 ENCOUNTER — Telehealth: Payer: Self-pay

## 2022-04-16 NOTE — Telephone Encounter (Signed)
        Patient  visited [facility] on 12/6     Telephone encounter attempt :  2nd  A HIPAA compliant voice message was left requesting a return call.  Instructed patient to call back .    [facility], [facility]  [phone] [address], [place], [place] [address] Phone: [phone] Email: [email or web address]

## 2022-05-02 ENCOUNTER — Encounter: Payer: Self-pay | Admitting: Pulmonary Disease

## 2022-05-02 ENCOUNTER — Telehealth: Payer: Self-pay | Admitting: Cardiovascular Disease

## 2022-05-02 ENCOUNTER — Ambulatory Visit (INDEPENDENT_AMBULATORY_CARE_PROVIDER_SITE_OTHER): Payer: Medicare Other | Admitting: Pulmonary Disease

## 2022-05-02 VITALS — BP 128/86 | HR 90 | Ht 72.0 in | Wt 253.6 lb

## 2022-05-02 DIAGNOSIS — R053 Chronic cough: Secondary | ICD-10-CM

## 2022-05-02 DIAGNOSIS — R0602 Shortness of breath: Secondary | ICD-10-CM | POA: Diagnosis not present

## 2022-05-02 DIAGNOSIS — J984 Other disorders of lung: Secondary | ICD-10-CM

## 2022-05-02 LAB — PULMONARY FUNCTION TEST
DL/VA % pred: 118 %
DL/VA: 4.63 ml/min/mmHg/L
DLCO cor % pred: 80 %
DLCO cor: 21.36 ml/min/mmHg
DLCO unc % pred: 77 %
DLCO unc: 20.4 ml/min/mmHg
FEF 25-75 Post: 2.55 L/sec
FEF 25-75 Pre: 1.48 L/sec
FEF2575-%Change-Post: 72 %
FEF2575-%Pred-Post: 110 %
FEF2575-%Pred-Pre: 63 %
FEV1-%Change-Post: 10 %
FEV1-%Pred-Post: 64 %
FEV1-%Pred-Pre: 58 %
FEV1-Post: 2.09 L
FEV1-Pre: 1.89 L
FEV1FVC-%Change-Post: 4 %
FEV1FVC-%Pred-Pre: 105 %
FEV6-%Change-Post: 3 %
FEV6-%Pred-Post: 60 %
FEV6-%Pred-Pre: 58 %
FEV6-Post: 2.56 L
FEV6-Pre: 2.48 L
FEV6FVC-%Change-Post: 0 %
FEV6FVC-%Pred-Post: 106 %
FEV6FVC-%Pred-Pre: 106 %
FVC-%Change-Post: 5 %
FVC-%Pred-Post: 58 %
FVC-%Pred-Pre: 55 %
FVC-Post: 2.62 L
FVC-Pre: 2.48 L
Post FEV1/FVC ratio: 80 %
Post FEV6/FVC ratio: 100 %
Pre FEV1/FVC ratio: 76 %
Pre FEV6/FVC Ratio: 100 %
RV % pred: 92 %
RV: 2.49 L
TLC % pred: 68 %
TLC: 5.1 L

## 2022-05-02 MED ORDER — TRELEGY ELLIPTA 100-62.5-25 MCG/ACT IN AEPB: 1.0000 | INHALATION_SPRAY | Freq: Every day | RESPIRATORY_TRACT | 0 refills | Status: DC

## 2022-05-02 MED ORDER — AZITHROMYCIN 250 MG PO TABS: ORAL_TABLET | ORAL | 0 refills | Status: DC

## 2022-05-02 MED ORDER — PREDNISONE 10 MG PO TABS: ORAL_TABLET | ORAL | 0 refills | Status: AC

## 2022-05-02 NOTE — Telephone Encounter (Signed)
Spoke with pt regarding chest pressure, irregular heart beat, fluctuating blood pressure and increased anxiety from recent death of his wife in 03-09-23. Pt states that he called EMS and was taken to the hospital for similar symptoms 4 weeks ago. He states that they did a cardiac work up on him and everything came back ok. At discharge pt was told to follow up with his PCP, which he did and Dr. Yong Channel gave him a prescription for SL nitroglycerin. Pt states that last night he felt the chest pressure again around 9pm. At that time he took 2 SL nitroglycerins about 15 minutes apart and his chest pressure was relieved, however it did drop his blood pressure to 60/30 which was concerning. Pt decided to not take his blood pressure medication (toprol-xl) last night but did take torsemide this morning. Pt is out doing errands but took his blood pressure earlier 146/80, HR 75. Pt would like to be seen in office. Made pt appointment to see DOD on 1/5. Pt given ED precautions. Pt verbalizes understanding.

## 2022-05-02 NOTE — Progress Notes (Signed)
PFT done today. 

## 2022-05-02 NOTE — Progress Notes (Signed)
Cardiology Office Note:    Date:  05/03/2022   ID:  Martin Agee Sr., DOB 08-22-1945, MRN [id number]  PCP:  Marin Olp, MD  Cardiologist:  Shelva Majestic, MD  Electrophysiologist:  None   Referring MD: Marin Olp, MD   Chief Complaint  Patient presents with   Coronary Artery Disease    History of Present Illness:    Martin UHER Sr. is a 77 y.o. male with a hx of CAD status post CABG, atrial fibrillation, CKD stage III, diabetes, hypertension, tobacco use, PAD, osteomyelitis of left foot who presents for follow-up.  He follows with Dr. Claiborne Billings.  Presented with inferior STEMI 09/29/2018.  Cath showed 90% mid RCA, 30% left main, 95% ostial LAD, 90% mid to distal LCx.  Underwent CABG x 4 (LIMA-LAD, SVG-D1, SVG-PDA, Y graft off PDA to PLA).  Echocardiogram showed EF 55 to 60%, no significant valvular disease.  He presents today for evaluation for chest pain.  States that he has been under a lot of stress recently.  His wife had metastatic breast cancer and passed away recently.  1 month ago he woke up at 3 AM with chest pain.  Went to ED, workup was negative.  Troponins negative x 2.  Chest pain lasted 45 to 60 minutes.  Did report improved with nitroglycerin.  Had episode of chest pain 2 days ago that resolved with nitro.  Yesterday had chest pain, described as pressure in center of his chest, lasted 2 to 3 hours.  He did not take nitroglycerin and it eventually resolved.  States that he feels he is having anxiety attacks.  He was seen by pulmonology yesterday and started on treatment for COPD exacerbation.  Past Medical History:  Diagnosis Date   A-fib ([facility])    CKD (chronic kidney disease) stage 3, GFR 30-59 ml/min (HCC) 08/29/2020   Diabetes mellitus    Heart attack ([facility]) 10/01/2018   Pt had open heart surgery   Hyperkalemia 08/29/2020   Hypertension    Osteomyelitis of fifth toe of left foot ([facility]) 05/08/2020   Simple chronic bronchitis ([facility]) 05/08/2020   Tobacco abuse      Past Surgical History:  Procedure Laterality Date   CORONARY ARTERY BYPASS GRAFT N/A 10/01/2018   Procedure: CORONARY ARTERY BYPASS GRAFTING (CABG) TIMES  FOUR USING LEFT MAMMARY ARTERY AND RIGHT GREATER SAPHEANOUS VEIN HARVESTED ENDOSCOPICALLY;  Surgeon: Melrose Nakayama, MD;  Location: [facility];  Service: Open Heart Surgery;  Laterality: N/A;   HERNIA REPAIR     >10 years ago   IR ANGIOGRAM EXTREMITY LEFT  06/16/2020   IR RADIOLOGIST EVAL & MGMT  12/24/2016   IR RADIOLOGIST EVAL & MGMT  05/30/2020   IR RADIOLOGIST EVAL & MGMT  07/13/2020   IR RADIOLOGIST EVAL & MGMT  09/06/2020   IR RADIOLOGIST EVAL & MGMT  12/26/2020   IR THORACENTESIS ASP PLEURAL SPACE W/IMG GUIDE  10/06/2018   IR US GUIDE VASC ACCESS RIGHT  06/16/2020   LEFT HEART CATH AND CORONARY ANGIOGRAPHY N/A 09/29/2018   Procedure: LEFT HEART CATH AND CORONARY ANGIOGRAPHY;  Surgeon: Troy Sine, MD;  Location: [facility];  Service: Cardiovascular;  Laterality: N/A;   open heart surfery     right shoulder surgery     around 2014   TEE WITHOUT CARDIOVERSION N/A 10/01/2018   Procedure: TRANSESOPHAGEAL ECHOCARDIOGRAM (TEE);  Surgeon: Melrose Nakayama, MD;  Location: [facility];  Service: Open Heart Surgery;  Laterality: N/A;  Current Medications: Current Meds  Medication Sig   aspirin 81 MG EC tablet Take 1 tablet (81 mg total) by mouth daily.   azelastine (ASTELIN) 0.1 % nasal spray Place 1 spray into both nostrils 2 (two) times daily.   azithromycin (ZITHROMAX) 250 MG tablet Take as directed   clopidogrel (PLAVIX) 75 MG tablet TAKE 1 TABLET BY MOUTH EVERY DAY   Continuous Blood Gluc Receiver (FREESTYLE LIBRE 2 READER) DEVI See admin instructions.   Continuous Blood Gluc Sensor (FREESTYLE LIBRE 2 SENSOR) MISC 1 Device by Does not apply route every 14 (fourteen) days.   empagliflozin (JARDIANCE) 10 MG TABS tablet Take 1 tablet (10 mg total) by mouth daily before breakfast.   ezetimibe (ZETIA) 10 MG tablet Take 1 tablet  (10 mg total) by mouth daily.   fluticasone (FLONASE) 50 MCG/ACT nasal spray Place 2 sprays into both nostrils daily as needed for allergies or rhinitis.   Fluticasone-Umeclidin-Vilant (TRELEGY ELLIPTA) 100-62.5-25 MCG/ACT AEPB Inhale 1 puff into the lungs daily.   Fluticasone-Umeclidin-Vilant (TRELEGY ELLIPTA) 100-62.5-25 MCG/ACT AEPB Inhale 1 puff into the lungs daily.   insulin aspart (NOVOLOG FLEXPEN) 100 UNIT/ML FlexPen Inject 20-25 Units into the skin 3 (three) times daily with meals. 3 times a day (just before each meal), 30-35-35 units, and pen needles 3/day   insulin glargine (LANTUS SOLOSTAR) 100 UNIT/ML Solostar Pen Inject 20 Units into the skin at bedtime. (Patient taking differently: Inject 10 Units into the skin at bedtime.)   Insulin Pen Needle (BD PEN NEEDLE NANO 2ND GEN) 32G X 4 MM MISC USE AS INSTRUCTED TO ADMINISTER INSULIN 5X DAILY   isosorbide mononitrate (IMDUR) 30 MG 24 hr tablet Take 1 tablet (30 mg total) by mouth daily.   losartan (COZAAR) 50 MG tablet TAKE 1 TABLET (50 MG) BY MOUTH IN THE MORNING AND AT BEDTIME   montelukast (SINGULAIR) 10 MG tablet Take 1 tablet (10 mg total) by mouth at bedtime.   nitroGLYCERIN (NITROSTAT) 0.4 MG SL tablet Place 1 tablet (0.4 mg total) under the tongue every 5 (five) minutes as needed for chest pain (if pain does not resolve with 2nd dose- seek care).   ondansetron (ZOFRAN-ODT) 4 MG disintegrating tablet Take 1 tablet (4 mg total) by mouth every 8 (eight) hours as needed for up to 20 doses for nausea or vomiting.   ONETOUCH VERIO test strip CHECK LEVELS 4 TIMES DAILY   Pitavastatin Calcium (LIVALO) 1 MG TABS Take 1 tablet (1 mg total) by mouth daily.   predniSONE (DELTASONE) 10 MG tablet Take 4 tablets (40 mg total) by mouth daily with breakfast for 3 days, THEN 3 tablets (30 mg total) daily with breakfast for 3 days, THEN 2 tablets (20 mg total) daily with breakfast for 3 days, THEN 1 tablet (10 mg total) daily with breakfast for 3 days.    torsemide (DEMADEX) 20 MG tablet MUST MAKE APPOINTMENT FOR FUTURE REFILL LAST ATTEMPT. TAKE 2 TABLETS BY MOUTH EVERY DAY (Patient taking differently: Take 40 mg by mouth daily as needed (For fluid).)   [DISCONTINUED] metoprolol succinate (TOPROL-XL) 50 MG 24 hr tablet Take 1.5 tablets (75 mg total) by mouth at bedtime. Take with or immediately following a meal.     Allergies:   Jardiance [empagliflozin], Simvastatin, Statins, and Xanax [alprazolam]   Social History   Socioeconomic History   Marital status: Married    Spouse name: Not on file   Number of children: Not on file   Years of education: Not on file  Highest education level: Not on file  Occupational History   Not on file  Tobacco Use   Smoking status: Former    Packs/day: 0.75    Years: 2.00    Total pack years: 1.50    Types: Cigarettes    Start date: 2002    Quit date: 04/29/2002    Years since quitting: 20.0   Smokeless tobacco: Never   Tobacco comments:    smoked for 10 years on and off  Substance and Sexual Activity   Alcohol use: Yes    Comment: occasionally   Drug use: No   Sexual activity: Yes  Other Topics Concern   Not on file  Social History Narrative   Married. 3 kids. 7 grandkids. No greatgrandkids.       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  ROS:   Please see the history of present illness.     All other systems reviewed and are negative.  EKGs/Labs/Other Studies Reviewed:    The following studies were reviewed today:   EKG:    05/03/2022: Normal sinus rhythm, rate 72, no ST abnormalities  Recent Labs: 12/18/2021: ALT 15 04/03/2022: BUN 28; Creatinine, Ser 1.53; Hemoglobin 13.1; Platelets 256; Potassium 3.9; Sodium 131  Recent Lipid Panel    Component Value Date/Time   CHOL 156 09/25/2021 1422   TRIG 120.0 09/25/2021 1422   HDL 37.80 (L) 09/25/2021 1422   CHOLHDL 4 09/25/2021 1422   VLDL 24.0 09/25/2021 1422   LDLCALC 94 09/25/2021 1422   LDLDIRECT 173.0 07/01/2018 1118    Physical Exam:    VS:  BP 134/70   Pulse 72   Ht 6' (1.829 m)   Wt 254 lb (115.2 kg)   SpO2 98%   BMI 34.45 kg/m     Wt Readings from Last 3 Encounters:  05/03/22 254 lb (115.2 kg)  05/02/22 253 lb 9.6 oz (115 kg)  04/12/22 267 lb (121.1 kg)     GEN:  Well nourished, well developed in no acute distress HEENT: Normal NECK: No JVD; No carotid bruits LYMPHATICS: No lymphadenopathy CARDIAC: RRR, 2/6 systolic murmur at RUSB RESPIRATORY:  Clear to auscultation without rales, wheezing or rhonchi  ABDOMEN: Soft, non-tender, non-distended MUSCULOSKELETAL:  No edema; No deformity  SKIN: Warm and dry NEUROLOGIC:  Alert and oriented x 3 PSYCHIATRIC:  Normal affect   ASSESSMENT:    1. Chest pain of uncertain etiology   2. Coronary artery disease involving native coronary artery of native heart, unspecified whether angina present   3. Murmur   4. Hyperlipidemia associated with type 2 diabetes mellitus ([facility] [place] [facility])   5. Essential hypertension    PLAN:    Chest pain: Reports having episodes of chest pressure in center of chest that improves with nitroglycerin, concerning for angina.  However had episode at rest recently that lasted for 45 minutes and went to ED workup, including troponins x 2 that were negative, suggesting noncardiac pain.  He has been having a lot of anxiety since his wife passed away  and is concerned he is having panic attacks.  Given his coronary history however, recommend that we evaluate for ischemia.  Check Lexiscan  Myoview.  Given improvement in symptoms with nitroglycerin, will add Imdur 30 mg daily  Heart murmur: 2/6 systolic murmur at RUSB, likely aortic sclerosis vs stenosis, will check echocardiogram  CAD: Status post CABG x 4 in 2020 -Continue aspirin, Plavix -Continue Zetia and livalo  Hypertension: On losartan 50 mg daily and Toprol-XL 75 mg daily.  Add Imdur as above  Hyperlipidemia: Zetia 10 mg daily and livalo 1 mg daily  Follow-up with Dr. Claiborne Billings or APP in 1 month   Medication Adjustments/Labs and Tests Ordered: Current medicines are reviewed at length with the patient today.  Concerns regarding medicines are outlined above.  Orders Placed This Encounter  Procedures   MYOCARDIAL PERFUSION IMAGING   EKG 12-Lead   ECHOCARDIOGRAM COMPLETE   Meds ordered this encounter  Medications   isosorbide mononitrate (IMDUR) 30 MG 24 hr tablet    Sig: Take 1 tablet (30 mg total) by mouth daily.    Dispense:  90 tablet    Refill:  3   metoprolol succinate (TOPROL-XL) 50 MG 24 hr tablet    Sig: Take 1.5 tablets (75 mg total) by mouth at bedtime. Take with or immediately following a meal.    Dispense:  135 tablet    Refill:  3    Patient Instructions  Medication Instructions:  START Imdur 30 mg daily   *If you need a refill on your cardiac medications before your next appointment, please call your pharmacy*  Testing/Procedures: Your physician has requested that you have a lexiscan myoview. For further information please visit [email or web address]. Please follow instruction sheet, as given.  Echocardiogram - Your physician has requested that you have an echocardiogram. Echocardiography is a painless test that uses sound waves to create images of your heart. It provides your doctor with information about the size and shape of your heart and how well your heart's chambers and valves are working. This procedure takes approximately one hour. There are no restrictions for this procedure.     Follow-Up: At [facility], you and your health needs are our priority.  As part of our continuing mission to provide you with exceptional heart care, we have created designated Provider Care Teams.  These Care Teams include your primary Cardiologist (physician) and Advanced Practice Providers (APPs -  Physician Assistants and Nurse Practitioners) who all work together to provide you with the care you need, when you need it.  We recommend signing up for the patient portal called "MyChart".  Sign up information is provided on this After Visit Summary.  MyChart is used to connect with patients for Virtual Visits (Telemedicine).  Patients are able to view lab/test results, encounter notes, upcoming appointments, etc.  Non-urgent messages can be sent to your provider as well.   To learn more about what you can do with MyChart, go to [email or web address].    Your next appointment:   1 month(s)  The format for your next appointment:   In Person  Provider:   Dr.Kelly or APP           Signed, Donato Heinz, MD  05/03/2022 12:15 PM    [facility]

## 2022-05-02 NOTE — Telephone Encounter (Signed)
Patient c/o Palpitations:  High priority if patient c/o lightheadedness, shortness of breath, or chest pain  How long have you had palpitations/irregular HR/ Afib? Are you having the symptoms now? 4 weeks, no  Are you currently experiencing lightheadedness, SOB or CP? Chest pressure, BP swinging up and down, some dizziness yesterday, nausea 4 weeks ago  Do you have a history of afib (atrial fibrillation) or irregular heart rhythm? Yes, has had tachycardia   Have you checked your BP or HR? (document readings if available): Does not have the readings with him, remembers the day of his ED visit 200/100, 135/75, this morning 94/88, later 146/78 HR 75   Are you experiencing any other symptoms? Just does not feel well  Patient states about 4 weeks ago he went to the ED for a heart attack. He says he has been having chest pressure, irregular HR, and his BP has been up and down. He says he is not having arm pain, but is having pressure in his chest now.  ?

## 2022-05-02 NOTE — Progress Notes (Signed)
Synopsis: Referred in August 2023 for chronic cough by Martin Reddish, MD  Subjective:   PATIENT ID: Martin [name] GENDER: male DOB: 1945/08/23, MRN: [id number]  HPI  Chief Complaint  Patient presents with   Follow-up    F/U after PFT.    Martin [name] is a 77 year old male, former smoker with atrial fibrillation, CKDIII, DMII, CAD, and hypertension who returns to pulmonary clinic for chronic cough.   He was started on montelukast '10mg'$  daily and trelegy ellipta 1 puff daily at last visit. He took montelukast for a few days and did not notice any improvement in his cough so he stopped it. He never started trelegy ellipta.   He reports increased chest pressure/pain over the last month that has been relieved by nitroglycerin. He had an ER visit which did not show EKG changes or troponin leak. He has follow up with his heart doctor tomorrow. His wife recently passed away so he is experiencing increased anxiety due to this.    PFTs today show Mild restrictive defect.   Initial OV 12/03/21 He reports having cough over the past year. The cough is worse in the Spring. The cough can be productive. He went to the beach in July and reports his cough was better at that time. He will have increased cough after mowing his property near [place] which makes his cough worse.   He is taking allegra, flonase and astelin for his allergies. He denies any issues with his breathing or cough with cold air. He denies GERD.  He smoked for 4 years and quit in 2004. He is a retired Education administrator. He had second hand smoke exposure in childhood from his parents.   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 ([place]) 05/08/2020   Simple chronic  bronchitis ([place]) 05/08/2020   Tobacco abuse      Family History  Problem Relation Age of Onset   Hypertension Mother    Heart disease Mother        CHF did not see doctor   Diabetes Maternal Grandmother    Diabetes Son      Social History   Socioeconomic History   Marital status: Married    Spouse name: Not on file   Number of children: Not on file   Years of education: Not on file   Highest education level: Not on file  Occupational History   Not on file  Tobacco Use   Smoking status: Former    Packs/day: 0.75    Years: 2.00    Total pack years: 1.50    Types: Cigarettes    Start date: 2002    Quit date: 04/29/2002    Years since quitting: 20.0   Smokeless tobacco: Never   Tobacco comments:    smoked for 10 years on and off  Substance and Sexual Activity   Alcohol use: Yes    Comment: occasionally   Drug use: No   Sexual activity: Yes  Other Topics Concern   Not on file  Social History Narrative   Married. 3 kids. 7 grandkids. No greatgrandkids.       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      Hobbies: golf previously, yardwork   Social Determinants of Health   Financial Resource Strain: Not on file  Food Insecurity: Not on file  Transportation Needs: Not on file  Physical Activity: Not on file  Stress: Not on file  Social Connections: Not on file  Intimate Partner Violence: Not on file     Allergies  Allergen Reactions   Jardiance [Empagliflozin] Nausea And Vomiting   Simvastatin Diarrhea   Statins Other (See Comments)    achey joints   Xanax [Alprazolam] Anxiety     Outpatient Medications Prior to Visit  Medication Sig Dispense Refill   aspirin 81 MG EC tablet Take 1 tablet (81 mg total) by mouth daily.     azelastine (ASTELIN) 0.1 % nasal spray Place 1 spray into both nostrils 2 (two) times daily. 30 mL 12   clopidogrel (PLAVIX) 75 MG tablet TAKE 1 TABLET BY MOUTH EVERY DAY 90 tablet 3   Continuous Blood Gluc  Receiver (FREESTYLE LIBRE 2 READER) DEVI See admin instructions.     Continuous Blood Gluc Sensor (FREESTYLE LIBRE 2 SENSOR) MISC 1 Device by Does not apply route every 14 (fourteen) days. 6 each 3   empagliflozin (JARDIANCE) 10 MG TABS tablet Take 1 tablet (10 mg total) by mouth daily before breakfast. 90 tablet 3   ezetimibe (ZETIA) 10 MG tablet Take 1 tablet (10 mg total) by mouth daily. 90 tablet 3   fluticasone (FLONASE) 50 MCG/ACT nasal spray Place 2 sprays into both nostrils daily as needed for allergies or rhinitis.     Fluticasone-Umeclidin-Vilant (TRELEGY ELLIPTA) 100-62.5-25 MCG/ACT AEPB Inhale 1 puff into the lungs daily. 1 each 0   insulin aspart (NOVOLOG FLEXPEN) 100 UNIT/ML FlexPen Inject 20-25 Units into the skin 3 (three) times daily with meals. 3 times a day (just before each meal), 30-35-35 units, and pen needles 3/day 75 mL 3   insulin glargine (LANTUS SOLOSTAR) 100 UNIT/ML Solostar Pen Inject 20 Units into the skin at bedtime. (Patient taking differently: Inject 10 Units into the skin at bedtime.) 15 mL 3   Insulin Pen Needle (BD PEN NEEDLE NANO 2ND GEN) 32G X 4 MM MISC USE AS INSTRUCTED TO ADMINISTER INSULIN 5X DAILY 500 each 1   losartan (COZAAR) 50 MG tablet TAKE 1 TABLET (50 MG) BY MOUTH IN THE MORNING AND AT BEDTIME 180 tablet 3   metoprolol succinate (TOPROL-XL) 50 MG 24 hr tablet Take 1.5 tablets (75 mg total) by mouth at bedtime. Take with or immediately following a meal. 135 tablet 3   montelukast (SINGULAIR) 10 MG tablet Take 1 tablet (10 mg total) by mouth at bedtime. 30 tablet 11   nitroGLYCERIN (NITROSTAT) 0.4 MG SL tablet Place 1 tablet (0.4 mg total) under the tongue every 5 (five) minutes as needed for chest pain (if pain does not resolve with 2nd dose- seek care). 20 tablet 5   ondansetron (ZOFRAN-ODT) 4 MG disintegrating tablet Take 1 tablet (4 mg total) by mouth every 8 (eight) hours as needed for up to 20 doses for nausea or vomiting. 20 tablet 0   ONETOUCH  VERIO test strip CHECK LEVELS 4 TIMES DAILY 100 strip 2   Pitavastatin Calcium (LIVALO) 1 MG TABS Take 1 tablet (1 mg total) by mouth daily. 90 tablet 3   torsemide (DEMADEX) 20 MG tablet MUST MAKE APPOINTMENT FOR FUTURE REFILL LAST ATTEMPT. TAKE 2 TABLETS BY MOUTH EVERY DAY (Patient taking differently: Take 40 mg by mouth daily as  needed (For fluid).) 30 tablet 0   No facility-administered medications prior to visit.   Review of Systems  Constitutional:  Negative for chills, fever, malaise/fatigue and weight loss.  HENT:  Negative for congestion, sinus pain and sore throat.   Eyes: Negative.   Respiratory:  Positive for cough, sputum production and shortness of breath. Negative for hemoptysis and wheezing.   Cardiovascular:  Positive for chest pain. Negative for palpitations, orthopnea, claudication and leg swelling.  Gastrointestinal:  Negative for abdominal pain, heartburn, nausea and vomiting.  Genitourinary: Negative.   Musculoskeletal:  Positive for joint pain. Negative for myalgias.  Skin:  Negative for rash.  Neurological:  Negative for weakness.  Endo/Heme/Allergies:  Positive for environmental allergies.  Psychiatric/Behavioral: Negative.     Objective:   Vitals:   05/02/22 1401  BP: 128/86  Pulse: 90  SpO2: 97%  Weight: 253 lb 9.6 oz (115 kg)  Height: 6' (1.829 m)   Physical Exam Constitutional:      General: He is not in acute distress. HENT:     Head: Normocephalic and atraumatic.  Eyes:     Conjunctiva/sclera: Conjunctivae normal.  Cardiovascular:     Rate and Rhythm: Normal rate and regular rhythm.     Pulses: Normal pulses.     Heart sounds: Normal heart sounds. No murmur heard. Pulmonary:     Effort: Pulmonary effort is normal.     Breath sounds: Wheezing and rhonchi present. No rales.  Musculoskeletal:     Right lower leg: No edema.     Left lower leg: No edema.  Lymphadenopathy:     Cervical: No cervical adenopathy.  Skin:    General: Skin is warm  and dry.  Neurological:     General: No focal deficit present.     Mental Status: He is alert.  Psychiatric:        Mood and Affect: Mood normal.        Behavior: Behavior normal.        Thought Content: Thought content normal.        Judgment: Judgment normal.    CBC    Component Value Date/Time   WBC 7.8 04/03/2022 0507   RBC 4.30 04/03/2022 0507   HGB 13.1 04/03/2022 0507   HCT 40.0 04/03/2022 0507   PLT 256 04/03/2022 0507   MCV 93.0 04/03/2022 0507   MCH 30.5 04/03/2022 0507   MCHC 32.8 04/03/2022 0507   RDW 13.2 04/03/2022 0507   LYMPHSABS 1.6 04/03/2022 0507   MONOABS 0.7 04/03/2022 0507   EOSABS 0.1 04/03/2022 0507   BASOSABS 0.0 04/03/2022 0507      Latest Ref Rng & Units 04/03/2022    5:07 AM 12/18/2021    8:44 AM 07/25/2021    8:51 AM  BMP  Glucose 70 - 99 mg/dL 294  200  166   BUN 8 - 23 mg/dL '28  18  22   '$ Creatinine 0.61 - 1.24 mg/dL 1.53  1.41  1.44   BUN/Creat Ratio 6 - 22 (calc)   15   Sodium 135 - 145 mmol/L 131  138  137   Potassium 3.5 - 5.1 mmol/L 3.9  4.4  4.7   Chloride 98 - 111 mmol/L 99  103  100   CO2 22 - 32 mmol/L '22  29  30   '$ Calcium 8.9 - 10.3 mg/dL 8.4  9.1  9.3    Chest imaging:  PFT:    Latest Ref Rng & Units 05/02/2022  12:44 PM  PFT Results  FVC-Pre L 2.48  P  FVC-Predicted Pre % 55  P  FVC-Post L 2.62  P  FVC-Predicted Post % 58  P  Pre FEV1/FVC % % 76  P  Post FEV1/FCV % % 80  P  FEV1-Pre L 1.89  P  FEV1-Predicted Pre % 58  P  FEV1-Post L 2.09  P  DLCO uncorrected ml/min/mmHg 20.40  P  DLCO UNC% % 77  P  DLCO corrected ml/min/mmHg 21.36  P  DLCO COR %Predicted % 80  P  DLVA Predicted % 118  P  TLC L 5.10  P  TLC % Predicted % 68  P  RV % Predicted % 92  P    P Preliminary result    Labs:  Path:  Echo:  Heart Catheterization:  Assessment & Plan:   Restrictive lung disease - Plan: CT CHEST HIGH RESOLUTION, predniSONE (DELTASONE) 10 MG tablet, azithromycin (ZITHROMAX) 250 MG tablet  Discussion: Christofer Shen [name] is a 77 year old male, former smoker with atrial fibrillation, CKDIII, DMII, CAD, and hypertension who returns for chronic cough.   He has mild restrictive defect on PFTs today. He has active wheezing and rhonchi with near significant bronchodilator response.   He is to start trelegy ellipta 1 puff daily. We will treat him for COPD exacerbation with prednisone taper and Zpak.   We will schedule him for high resolution CT Chest scan to further evaluate the restrictive defect for possible ILD.   Follow up in 1 month.  Freda [name], MD [facility] Office: [phone]   Current Outpatient Medications:    aspirin 81 MG EC tablet, Take 1 tablet (81 mg total) by mouth daily., Disp: , Rfl:    azelastine (ASTELIN) 0.1 % nasal spray, Place 1 spray into both nostrils 2 (two) times daily., Disp: 30 mL, Rfl: 12   azithromycin (ZITHROMAX) 250 MG tablet, Take as directed, Disp: 6 tablet, Rfl: 0   clopidogrel (PLAVIX) 75 MG tablet, TAKE 1 TABLET BY MOUTH EVERY DAY, Disp: 90 tablet, Rfl: 3   Continuous Blood Gluc Receiver (FREESTYLE LIBRE 2 READER) DEVI, See admin instructions., Disp: , Rfl:    Continuous Blood Gluc Sensor (FREESTYLE LIBRE 2 SENSOR) MISC, 1 Device by Does not apply route every 14 (fourteen) days., Disp: 6 each, Rfl: 3   empagliflozin (JARDIANCE) 10 MG TABS tablet, Take 1 tablet (10 mg total) by mouth daily before breakfast., Disp: 90 tablet, Rfl: 3   ezetimibe (ZETIA) 10 MG tablet, Take 1 tablet (10 mg total) by mouth daily., Disp: 90 tablet, Rfl: 3   fluticasone (FLONASE) 50 MCG/ACT nasal spray, Place 2 sprays into both nostrils daily as needed for allergies or rhinitis., Disp: , Rfl:    Fluticasone-Umeclidin-Vilant (TRELEGY ELLIPTA) 100-62.5-25 MCG/ACT AEPB, Inhale 1 puff into the lungs daily., Disp: 1 each, Rfl: 0   Fluticasone-Umeclidin-Vilant (TRELEGY ELLIPTA) 100-62.5-25 MCG/ACT AEPB, Inhale 1 puff into the lungs daily., Disp: 1 each, Rfl: 0    insulin aspart (NOVOLOG FLEXPEN) 100 UNIT/ML FlexPen, Inject 20-25 Units into the skin 3 (three) times daily with meals. 3 times a day (just before each meal), 30-35-35 units, and pen needles 3/day, Disp: 75 mL, Rfl: 3   insulin glargine (LANTUS SOLOSTAR) 100 UNIT/ML Solostar Pen, Inject 20 Units into the skin at bedtime. (Patient taking differently: Inject 10 Units into the skin at bedtime.), Disp: 15 mL, Rfl: 3   Insulin Pen Needle (BD PEN NEEDLE NANO 2ND GEN)  32G X 4 MM MISC, USE AS INSTRUCTED TO ADMINISTER INSULIN 5X DAILY, Disp: 500 each, Rfl: 1   losartan (COZAAR) 50 MG tablet, TAKE 1 TABLET (50 MG) BY MOUTH IN THE MORNING AND AT BEDTIME, Disp: 180 tablet, Rfl: 3   metoprolol succinate (TOPROL-XL) 50 MG 24 hr tablet, Take 1.5 tablets (75 mg total) by mouth at bedtime. Take with or immediately following a meal., Disp: 135 tablet, Rfl: 3   montelukast (SINGULAIR) 10 MG tablet, Take 1 tablet (10 mg total) by mouth at bedtime., Disp: 30 tablet, Rfl: 11   nitroGLYCERIN (NITROSTAT) 0.4 MG SL tablet, Place 1 tablet (0.4 mg total) under the tongue every 5 (five) minutes as needed for chest pain (if pain does not resolve with 2nd dose- seek care)., Disp: 20 tablet, Rfl: 5   ondansetron (ZOFRAN-ODT) 4 MG disintegrating tablet, Take 1 tablet (4 mg total) by mouth every 8 (eight) hours as needed for up to 20 doses for nausea or vomiting., Disp: 20 tablet, Rfl: 0   ONETOUCH VERIO test strip, CHECK LEVELS 4 TIMES DAILY, Disp: 100 strip, Rfl: 2   Pitavastatin Calcium (LIVALO) 1 MG TABS, Take 1 tablet (1 mg total) by mouth daily., Disp: 90 tablet, Rfl: 3   predniSONE (DELTASONE) 10 MG tablet, Take 4 tablets (40 mg total) by mouth daily with breakfast for 3 days, THEN 3 tablets (30 mg total) daily with breakfast for 3 days, THEN 2 tablets (20 mg total) daily with breakfast for 3 days, THEN 1 tablet (10 mg total) daily with breakfast for 3 days., Disp: 30 tablet, Rfl: 0   torsemide (DEMADEX) 20 MG tablet, MUST MAKE  APPOINTMENT FOR FUTURE REFILL LAST ATTEMPT. TAKE 2 TABLETS BY MOUTH EVERY DAY (Patient taking differently: Take 40 mg by mouth daily as needed (For fluid).), Disp: 30 tablet, Rfl: 0

## 2022-05-02 NOTE — Patient Instructions (Signed)
Start prednisone taper: '40mg'$  daily for 3 days '30mg'$  daily for 3 days '20mg'$  daily for 3 days '10mg'$  daily for 3 days  Start Zpak antibiotic  Start trelegy ellipta inhaler 1 puff daily - rinse mouth out after each use  Your breathing tests show a mild restrictive defect, we will check a high resolution CT Chest scan to further evaluate this.  Follow up in 1 month

## 2022-05-03 ENCOUNTER — Ambulatory Visit: Payer: Medicare Other | Attending: Cardiology | Admitting: Cardiology

## 2022-05-03 ENCOUNTER — Encounter: Payer: Self-pay | Admitting: Cardiology

## 2022-05-03 VITALS — BP 134/70 | HR 72 | Ht 72.0 in | Wt 254.0 lb

## 2022-05-03 DIAGNOSIS — R079 Chest pain, unspecified: Secondary | ICD-10-CM | POA: Diagnosis not present

## 2022-05-03 DIAGNOSIS — E1169 Type 2 diabetes mellitus with other specified complication: Secondary | ICD-10-CM | POA: Diagnosis not present

## 2022-05-03 DIAGNOSIS — E785 Hyperlipidemia, unspecified: Secondary | ICD-10-CM

## 2022-05-03 DIAGNOSIS — I2510 Atherosclerotic heart disease of native coronary artery without angina pectoris: Secondary | ICD-10-CM | POA: Diagnosis not present

## 2022-05-03 DIAGNOSIS — R011 Cardiac murmur, unspecified: Secondary | ICD-10-CM

## 2022-05-03 DIAGNOSIS — I10 Essential (primary) hypertension: Secondary | ICD-10-CM

## 2022-05-03 MED ORDER — ISOSORBIDE MONONITRATE ER 30 MG PO TB24: 30.0000 mg | ORAL_TABLET | Freq: Every day | ORAL | 3 refills | Status: DC

## 2022-05-03 MED ORDER — METOPROLOL SUCCINATE ER 50 MG PO TB24: 75.0000 mg | ORAL_TABLET | Freq: Every evening | ORAL | 3 refills | Status: DC

## 2022-05-03 NOTE — Patient Instructions (Signed)
Medication Instructions:  START Imdur 30 mg daily   *If you need a refill on your cardiac medications before your next appointment, please call your pharmacy*  Testing/Procedures: Your physician has requested that you have a lexiscan myoview. For further information please visit [email or web address]. Please follow instruction sheet, as given.  Echocardiogram - Your physician has requested that you have an echocardiogram. Echocardiography is a painless test that uses sound waves to create images of your heart. It provides your doctor with information about the size and shape of your heart and how well your heart's chambers and valves are working. This procedure takes approximately one hour. There are no restrictions for this procedure.    Follow-Up: At [facility], you and your health needs are our priority.  As part of our continuing mission to provide you with exceptional heart care, we have created designated Provider Care Teams.  These Care Teams include your primary Cardiologist (physician) and Advanced Practice Providers (APPs -  Physician Assistants and Nurse Practitioners) who all work together to provide you with the care you need, when you need it.  We recommend signing up for the patient portal called "MyChart".  Sign up information is provided on this After Visit Summary.  MyChart is used to connect with patients for Virtual Visits (Telemedicine).  Patients are able to view lab/test results, encounter notes, upcoming appointments, etc.  Non-urgent messages can be sent to your provider as well.   To learn more about what you can do with MyChart, go to [email or web address].    Your next appointment:   1 month(s)  The format for your next appointment:   In Person  Provider:   Dr.Kelly or APP

## 2022-05-03 NOTE — Telephone Encounter (Signed)
Arrange for follow-up office visit

## 2022-05-07 ENCOUNTER — Telehealth: Payer: Self-pay | Admitting: Cardiovascular Disease

## 2022-05-07 NOTE — Telephone Encounter (Signed)
*  STAT* If patient is at the pharmacy, call can be transferred to refill team.   1. Which medications need to be refilled? (please list name of each medication and dose if known)   isosorbide mononitrate (IMDUR) 30 MG 24 hr tablet  metoprolol succinate (TOPROL-XL) 50 MG 24 hr tablet   2. Which pharmacy/location (including street and city if local pharmacy) is medication to be sent to?  [place] [place]   3. Do they need a 30 day or 90 day supply?   90 day  Patient stated these prescriptions were sent to the wrong pharmacy and they should go to the [facility].

## 2022-05-07 NOTE — Telephone Encounter (Signed)
Patient stated he felt better when he was taking metoprolol tartrate '25mg'$  twice daily, where SBP was 150s in the morning. In November, PCP placed him on metoprolol succinate '75mg'$  at night. Patient stated since he has been taking succinate on 12/17, he feels anxious nauseated and dizzy; however, SBP 120s to 140s. Blood pressures have been controlled, but side effects make patient uncomfortable. He has not taken met succ for 4 days and feels better; also no chest pressure. While on phone, BP 162/85, P 84. Reviewed how to take NTG. Gave patient the on-call to cardiology number if he needs them tonight. He stated he got sick in the ED and will not go back.Please advise on met succinate.

## 2022-05-07 NOTE — Telephone Encounter (Signed)
Martin [name]- Dr. Claiborne Billings started him on this 03/12/2021- from note "Stop taking metoprolol tartrate. Start taking metoprolol succinate 75 mg (1 and 1/2 tablets) each night."  I will defer to his expertise - does not appear I have sent in any of the metoprolol prescriptions best I can tell- all appear to be through cardiolgoy

## 2022-05-07 NOTE — Telephone Encounter (Signed)
Pt c/o medication issue:  1. Name of Medication:   metoprolol succinate (TOPROL-XL) 50 MG 24 hr tablet   2. How are you currently taking this medication (dosage and times per day)?  As prescribed but the patient stated he stopped taking the medication as it was causing him nausea, dizziness and anxiety.  3. Are you having a reaction (difficulty breathing--STAT)?  No  4. What is your medication issue?   Patient stated he is having some side effects with this medication he was not having previously when he was taking metoprolol tartrate (LOPRESSOR) 50 MG tablet.

## 2022-05-08 ENCOUNTER — Telehealth: Payer: Self-pay | Admitting: Cardiovascular Disease

## 2022-05-08 NOTE — Telephone Encounter (Signed)
Patient is being taken to hospital, MD aware, will update RX via MD response

## 2022-05-08 NOTE — Telephone Encounter (Signed)
Pt c/o of Chest Pain: STAT if CP now or developed within 24 hours  1. Are you having CP right now? Yes - chest pressure  2. Are you experiencing any other symptoms (ex. SOB, nausea, vomiting, sweating)? Nausea  3. How long have you been experiencing CP?  Anxiety started this morning at 7:30 am, at 7:45 am patient checked his BP 175/85 (in that range), patient took Nitroglycerin around 7:47-48 am  4. Is your CP continuous or coming and going?   Continuous  5. Have you taken Nitroglycerin?  Yes  Patient stated he has chest "pressure  Patient stated his BP went really high this morning and he had to take Nitroglycerin to get it down.  Patient stated his nausea started after he took the Nitroglycerin.  Patient stated his BP returned to 135/70. ?

## 2022-05-08 NOTE — Telephone Encounter (Signed)
Patient called triage because of chest pressure and elevated BP. He stated that round 7:00 this AM, hs BP was 175/85 and he felt anxious. He stated he took NTG to bring blood pressure down. It decreased to 135/70. Then around 8:30, BP 156/80. During this entire event, he had chest pressure. He wanted to take another NTG. Explained the way he is using NTG to lower BP is dangerous. Yesterday, on speaking with patient, he was given education on the use of NTG. Recommended he not drive and call [phone] to take him to the ED. He stated his son will take him to the ED. Called patient back and encouraged him to call 911. He stated he would have EMS take him to the [facility] ED.

## 2022-05-10 NOTE — Telephone Encounter (Signed)
ok 

## 2022-05-14 DIAGNOSIS — E113213 Type 2 diabetes mellitus with mild nonproliferative diabetic retinopathy with macular edema, bilateral: Secondary | ICD-10-CM | POA: Diagnosis not present

## 2022-05-14 DIAGNOSIS — H40023 Open angle with borderline findings, high risk, bilateral: Secondary | ICD-10-CM | POA: Diagnosis not present

## 2022-05-14 DIAGNOSIS — D3131 Benign neoplasm of right choroid: Secondary | ICD-10-CM | POA: Diagnosis not present

## 2022-05-14 DIAGNOSIS — H35013 Changes in retinal vascular appearance, bilateral: Secondary | ICD-10-CM | POA: Diagnosis not present

## 2022-05-22 ENCOUNTER — Telehealth (HOSPITAL_COMMUNITY): Payer: Self-pay | Admitting: *Deleted

## 2022-05-22 NOTE — Telephone Encounter (Signed)
Patient given detailed instructions per Myocardial Perfusion Study Information Sheet for the test on 05/29/22 Patient notified to arrive 15 minutes early and that it is imperative to arrive on time for appointment to keep from having the test rescheduled.  If you need to cancel or reschedule your appointment, please call the office within 24 hours of your appointment. . Patient verbalized understanding. Kirstie Peri, RN

## 2022-05-29 ENCOUNTER — Ambulatory Visit (HOSPITAL_COMMUNITY): Payer: Medicare Other

## 2022-05-29 ENCOUNTER — Ambulatory Visit (HOSPITAL_COMMUNITY): Payer: Medicare Other | Attending: Cardiology

## 2022-05-29 ENCOUNTER — Ambulatory Visit (HOSPITAL_BASED_OUTPATIENT_CLINIC_OR_DEPARTMENT_OTHER): Payer: Medicare Other

## 2022-05-29 DIAGNOSIS — R011 Cardiac murmur, unspecified: Secondary | ICD-10-CM | POA: Diagnosis not present

## 2022-05-29 DIAGNOSIS — R079 Chest pain, unspecified: Secondary | ICD-10-CM

## 2022-05-29 LAB — MYOCARDIAL PERFUSION IMAGING
LV dias vol: 67 mL (ref 62–150)
LV sys vol: 26 mL
Nuc Stress EF: 61 %
Peak HR: 104 {beats}/min
Rest HR: 79 {beats}/min
Rest Nuclear Isotope Dose: 10.9 mCi
SDS: 3
SRS: 0
SSS: 3
ST Depression (mm): 0 mm
Stress Nuclear Isotope Dose: 33 mCi
TID: 0.86

## 2022-05-29 LAB — ECHOCARDIOGRAM COMPLETE
AR max vel: 1.58 cm2
AV Area VTI: 1.72 cm2
AV Area mean vel: 1.59 cm2
AV Mean grad: 9 mmHg
AV Peak grad: 16.2 mmHg
Ao pk vel: 2.01 m/s
Area-P 1/2: 2.48 cm2
S' Lateral: 2.2 cm

## 2022-05-29 MED ORDER — TECHNETIUM TC 99M TETROFOSMIN IV KIT
33.0000 | PACK | Freq: Once | INTRAVENOUS | Status: AC | PRN
  Administered 2022-05-29: 33 via INTRAVENOUS

## 2022-05-29 MED ORDER — TECHNETIUM TC 99M TETROFOSMIN IV KIT
10.9000 | PACK | Freq: Once | INTRAVENOUS | Status: AC | PRN
  Administered 2022-05-29: 10.9 via INTRAVENOUS

## 2022-05-29 MED ORDER — REGADENOSON 0.4 MG/5ML IV SOLN
0.4000 mg | Freq: Once | INTRAVENOUS | Status: AC
  Administered 2022-05-29: 0.4 mg via INTRAVENOUS

## 2022-05-29 MED ORDER — PERFLUTREN LIPID MICROSPHERE
1.0000 mL | INTRAVENOUS | Status: AC | PRN
  Administered 2022-05-29 (×2): 2 mL via INTRAVENOUS

## 2022-05-31 ENCOUNTER — Ambulatory Visit
Admission: RE | Admit: 2022-05-31 | Discharge: 2022-05-31 | Disposition: A | Discharge status: Home / Self Care | Payer: Medicare Other | Source: Ambulatory Visit | Attending: Pulmonary Disease | Admitting: Pulmonary Disease

## 2022-05-31 DIAGNOSIS — I700 Atherosclerosis of aorta: Secondary | ICD-10-CM | POA: Diagnosis not present

## 2022-05-31 DIAGNOSIS — J849 Interstitial pulmonary disease, unspecified: Secondary | ICD-10-CM | POA: Diagnosis not present

## 2022-05-31 DIAGNOSIS — J8410 Pulmonary fibrosis, unspecified: Secondary | ICD-10-CM | POA: Diagnosis not present

## 2022-05-31 DIAGNOSIS — J984 Other disorders of lung: Secondary | ICD-10-CM

## 2022-05-31 DIAGNOSIS — J929 Pleural plaque without asbestos: Secondary | ICD-10-CM | POA: Diagnosis not present

## 2022-06-03 ENCOUNTER — Encounter: Payer: Self-pay | Admitting: Pulmonary Disease

## 2022-06-03 ENCOUNTER — Ambulatory Visit: Payer: Medicare Other | Admitting: Pulmonary Disease

## 2022-06-03 VITALS — BP 128/84 | HR 77 | Ht 72.0 in | Wt 253.0 lb

## 2022-06-03 DIAGNOSIS — J4530 Mild persistent asthma, uncomplicated: Secondary | ICD-10-CM | POA: Diagnosis not present

## 2022-06-03 DIAGNOSIS — J984 Other disorders of lung: Secondary | ICD-10-CM | POA: Diagnosis not present

## 2022-06-03 MED ORDER — TRELEGY ELLIPTA 100-62.5-25 MCG/ACT IN AEPB: 1.0000 | INHALATION_SPRAY | Freq: Every day | RESPIRATORY_TRACT | 0 refills | Status: DC

## 2022-06-03 NOTE — Progress Notes (Unsigned)
Synopsis: Referred in August 2023 for chronic cough by [name], MD  Subjective:   PATIENT ID: Martin [name] GENDER: male DOB: Mar 07, 1946, MRN: [id number]  HPI  Chief Complaint  Patient presents with   Follow-up    1 mo f/u after prednisone taper and CT scan. States his cough has improved. Has not used the Trelegy since last visit.    Martin [name] is a 77 year old male, former smoker with atrial fibrillation, CKDIII, DMII, CAD, and hypertension who returns to pulmonary clinic for chronic cough.   He is feeling better. Not coughing up mucous now Breathing feels better and SpO2 levels are improved.  He is using trelegy as needed.   OV 05/02/22 He was started on montelukast '10mg'$  daily and trelegy ellipta 1 puff daily at last visit. He took montelukast for a few days and did not notice any improvement in his cough so he stopped it. He never started trelegy ellipta.   He reports increased chest pressure/pain over the last month that has been relieved by nitroglycerin. He had an ER visit which did not show EKG changes or troponin leak. He has follow up with his heart doctor tomorrow. His wife recently passed away so he is experiencing increased anxiety due to this.    PFTs today show Mild restrictive defect.   Initial OV 12/03/21 He reports having cough over the past year. The cough is worse in the Spring. The cough can be productive. He went to the beach in July and reports his cough was better at that time. He will have increased cough after mowing his property near [place] which makes his cough worse.   He is taking allegra, flonase and astelin for his allergies. He denies any issues with his breathing or cough with cold air. He denies GERD.  He smoked for 4 years and quit in 2004. He is a retired Education administrator. He had second hand smoke exposure in childhood from his parents.   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   Tobacco abuse      Family History  Problem Relation Age of Onset   Hypertension Mother    Heart disease Mother        CHF did not see doctor   Diabetes Maternal Grandmother    Diabetes Son      Social History   Socioeconomic History   Marital status: Married    Spouse name: Not on file   Number of children: Not on file   Years of education: Not on file   Highest education level: Not on file  Occupational History   Not on file  Tobacco Use   Smoking status: Former    Packs/day: 0.75    Years: 2.00    Total pack years: 1.50    Types: Cigarettes    Start date: 2002    Quit date: 04/29/2002    Years since quitting: 20.1   Smokeless tobacco: Never   Tobacco comments:    smoked for 10 years on and off  Substance and Sexual Activity   Alcohol use: Yes    Comment:  occasionally   Drug use: No   Sexual activity: Yes  Other Topics Concern   Not on file  Social History Narrative   Married. 3 kids. 7 grandkids. No greatgrandkids.       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      Hobbies: golf previously, Haematologist   Social Determinants of Radio broadcast assistant Strain: Not on [employer] Insecurity: Not on file  Transportation Needs: Not on file  Physical Activity: Not on file  Stress: Not on file  Social Connections: Not on file  Intimate Partner Violence: Not on file     Allergies  Allergen Reactions   Jardiance [Empagliflozin] Nausea And Vomiting   Simvastatin Diarrhea   Statins Other (See Comments)    achey joints   Xanax [Alprazolam] Anxiety     Outpatient Medications Prior to Visit  Medication Sig Dispense Refill   aspirin 81 MG EC tablet Take 1  tablet (81 mg total) by mouth daily.     azelastine (ASTELIN) 0.1 % nasal spray Place 1 spray into both nostrils 2 (two) times daily. 30 mL 12   clopidogrel (PLAVIX) 75 MG tablet TAKE 1 TABLET BY MOUTH EVERY DAY 90 tablet 3   Continuous Blood Gluc Receiver (FREESTYLE LIBRE 2 READER) DEVI See admin instructions.     Continuous Blood Gluc Sensor (FREESTYLE LIBRE 2 SENSOR) MISC 1 Device by Does not apply route every 14 (fourteen) days. 6 each 3   empagliflozin (JARDIANCE) 10 MG TABS tablet Take 1 tablet (10 mg total) by mouth daily before breakfast. 90 tablet 3   ezetimibe (ZETIA) 10 MG tablet Take 1 tablet (10 mg total) by mouth daily. 90 tablet 3   fluticasone (FLONASE) 50 MCG/ACT nasal spray Place 2 sprays into both nostrils daily as needed for allergies or rhinitis.     Fluticasone-Umeclidin-Vilant (TRELEGY ELLIPTA) 100-62.5-25 MCG/ACT AEPB Inhale 1 puff into the lungs daily. 1 each 0   insulin aspart (NOVOLOG FLEXPEN) 100 UNIT/ML FlexPen Inject 20-25 Units into the skin 3 (three) times daily with meals. 3 times a day (just before each meal), 30-35-35 units, and pen needles 3/day 75 mL 3   insulin glargine (LANTUS SOLOSTAR) 100 UNIT/ML Solostar Pen Inject 20 Units into the skin at bedtime. (Patient taking differently: Inject 10 Units into the skin at bedtime.) 15 mL 3   Insulin Pen Needle (BD PEN NEEDLE NANO 2ND GEN) 32G X 4 MM MISC USE AS INSTRUCTED TO ADMINISTER INSULIN 5X DAILY 500 each 1   isosorbide mononitrate (IMDUR) 30 MG 24 hr tablet Take 1 tablet (30 mg total) by mouth daily. 90 tablet 3   losartan (COZAAR) 50 MG tablet TAKE 1 TABLET (50 MG) BY MOUTH IN THE MORNING AND AT BEDTIME 180 tablet 3   montelukast (SINGULAIR) 10 MG tablet Take 1 tablet (10 mg total) by mouth at bedtime. 30 tablet 11   nitroGLYCERIN (NITROSTAT) 0.4 MG SL tablet Place 1 tablet (0.4 mg total) under the tongue every 5 (five) minutes as needed for chest pain (if pain does not resolve with 2nd dose- seek care). 20 tablet  5   ondansetron (ZOFRAN-ODT) 4 MG disintegrating tablet Take 1 tablet (4 mg total) by mouth every 8 (eight) hours as needed for up to 20 doses for nausea or vomiting. 20 tablet 0   ONETOUCH VERIO test strip CHECK LEVELS 4 TIMES DAILY 100 strip 2   Pitavastatin Calcium (LIVALO) 1 MG TABS Take 1 tablet (1 mg  total) by mouth daily. 90 tablet 3   torsemide (DEMADEX) 20 MG tablet MUST MAKE APPOINTMENT FOR FUTURE REFILL LAST ATTEMPT. TAKE 2 TABLETS BY MOUTH EVERY DAY (Patient taking differently: Take 40 mg by mouth daily as needed (For fluid).) 30 tablet 0   azithromycin (ZITHROMAX) 250 MG tablet Take as directed 6 tablet 0   Fluticasone-Umeclidin-Vilant (TRELEGY ELLIPTA) 100-62.5-25 MCG/ACT AEPB Inhale 1 puff into the lungs daily. 1 each 0   metoprolol succinate (TOPROL-XL) 50 MG 24 hr tablet Take 1.5 tablets (75 mg total) by mouth at bedtime. Take with or immediately following a meal. (Patient not taking: Reported on 06/03/2022) 135 tablet 3   No facility-administered medications prior to visit.   Review of Systems  Constitutional:  Negative for chills, fever, malaise/fatigue and weight loss.  HENT:  Negative for congestion, sinus pain and sore throat.   Eyes: Negative.   Respiratory:  Positive for cough, sputum production and shortness of breath. Negative for hemoptysis and wheezing.   Cardiovascular:  Positive for chest pain. Negative for palpitations, orthopnea, claudication and leg swelling.  Gastrointestinal:  Negative for abdominal pain, heartburn, nausea and vomiting.  Genitourinary: Negative.   Musculoskeletal:  Positive for joint pain. Negative for myalgias.  Skin:  Negative for rash.  Neurological:  Negative for weakness.  Endo/Heme/Allergies:  Positive for environmental allergies.  Psychiatric/Behavioral: Negative.     Objective:   Vitals:   06/03/22 0843  BP: 128/84  Pulse: 77  SpO2: 99%  Weight: 253 lb (114.8 kg)  Height: 6' (1.829 m)   Physical Exam Constitutional:       General: He is not in acute distress. HENT:     Head: Normocephalic and atraumatic.  Eyes:     Conjunctiva/sclera: Conjunctivae normal.  Cardiovascular:     Rate and Rhythm: Normal rate and regular rhythm.     Pulses: Normal pulses.     Heart sounds: Normal heart sounds. No murmur heard. Pulmonary:     Effort: Pulmonary effort is normal.     Breath sounds: Rhonchi and rales present. No wheezing.  Musculoskeletal:     Right lower leg: No edema.     Left lower leg: No edema.  Lymphadenopathy:     Cervical: No cervical adenopathy.  Skin:    General: Skin is warm and dry.  Neurological:     General: No focal deficit present.     Mental Status: He is alert.  Psychiatric:        Mood and Affect: Mood normal.        Behavior: Behavior normal.        Thought Content: Thought content normal.        Judgment: Judgment normal.    CBC    Component Value Date/Time   WBC 7.8 04/03/2022 0507   RBC 4.30 04/03/2022 0507   HGB 13.1 04/03/2022 0507   HCT 40.0 04/03/2022 0507   PLT 256 04/03/2022 0507   MCV 93.0 04/03/2022 0507   MCH 30.5 04/03/2022 0507   MCHC 32.8 04/03/2022 0507   RDW 13.2 04/03/2022 0507   LYMPHSABS 1.6 04/03/2022 0507   MONOABS 0.7 04/03/2022 0507   EOSABS 0.1 04/03/2022 0507   BASOSABS 0.0 04/03/2022 0507      Latest Ref Rng & Units 04/03/2022    5:07 AM 12/18/2021    8:44 AM 07/25/2021    8:51 AM  BMP  Glucose 70 - 99 mg/dL 294  200  166   BUN 8 - 23 mg/dL 28  18  22   Creatinine 0.61 - 1.24 mg/dL 1.53  1.41  1.44   BUN/Creat Ratio 6 - 22 (calc)   15   Sodium 135 - 145 mmol/L 131  138  137   Potassium 3.5 - 5.1 mmol/L 3.9  4.4  4.7   Chloride 98 - 111 mmol/L 99  103  100   CO2 22 - 32 mmol/L '22  29  30   '$ Calcium 8.9 - 10.3 mg/dL 8.4  9.1  9.3    Chest imaging: HRCT Chest 05/31/22 1. Mild bilateral bronchial wall thickening with innumerable centrilobular nodules, primarily in the lower lungs, overall worsened when compared with the prior exam, findings  are likely due to recurrent aspiration or infectious bronchiolitis. 2. Patulous esophagus, findings can be seen in the setting of esophageal dysmotility. 3. Severe coronary artery calcifications status post CABG. 4. Aortic Atherosclerosis (ICD10-I70.0).  PFT:    Latest Ref Rng & Units 05/02/2022   12:44 PM  PFT Results  FVC-Pre L 2.48   FVC-Predicted Pre % 55   FVC-Post L 2.62   FVC-Predicted Post % 58   Pre FEV1/FVC % % 76   Post FEV1/FCV % % 80   FEV1-Pre L 1.89   FEV1-Predicted Pre % 58   FEV1-Post L 2.09   DLCO uncorrected ml/min/mmHg 20.40   DLCO UNC% % 77   DLCO corrected ml/min/mmHg 21.36   DLCO COR %Predicted % 80   DLVA Predicted % 118   TLC L 5.10   TLC % Predicted % 68   RV % Predicted % 92     Labs:  Path:  Echo 05/29/22: LV EF 65-70%. Grade I diastolic dysfunction. RV systolic function is normal along with size. Mild aortic valve stenosis.  Heart Catheterization:  Assessment & Plan:   No diagnosis found.  Discussion: Martin [name] is a 77 year old male, former smoker with atrial fibrillation, CKDIII, DMII, CAD, and hypertension who returns for chronic cough.   He has mild restrictive defect on PFTs today. He has active wheezing and rhonchi with near significant bronchodilator response.   He is to start trelegy ellipta 1 puff daily. We will treat him for COPD exacerbation with prednisone taper and Zpak.   We will schedule him for high resolution CT Chest scan to further evaluate the restrictive defect for possible ILD.   Follow up in 1 month.  Freda [name], MD [facility] Office: [phone]   Current Outpatient Medications:    aspirin 81 MG EC tablet, Take 1 tablet (81 mg total) by mouth daily., Disp: , Rfl:    azelastine (ASTELIN) 0.1 % nasal spray, Place 1 spray into both nostrils 2 (two) times daily., Disp: 30 mL, Rfl: 12   clopidogrel (PLAVIX) 75 MG tablet, TAKE 1 TABLET BY MOUTH EVERY DAY, Disp: 90 tablet, Rfl:  3   Continuous Blood Gluc Receiver (FREESTYLE LIBRE 2 READER) DEVI, See admin instructions., Disp: , Rfl:    Continuous Blood Gluc Sensor (FREESTYLE LIBRE 2 SENSOR) MISC, 1 Device by Does not apply route every 14 (fourteen) days., Disp: 6 each, Rfl: 3   empagliflozin (JARDIANCE) 10 MG TABS tablet, Take 1 tablet (10 mg total) by mouth daily before breakfast., Disp: 90 tablet, Rfl: 3   ezetimibe (ZETIA) 10 MG tablet, Take 1 tablet (10 mg total) by mouth daily., Disp: 90 tablet, Rfl: 3   fluticasone (FLONASE) 50 MCG/ACT nasal spray, Place 2 sprays into both nostrils daily as needed for allergies  or rhinitis., Disp: , Rfl:    Fluticasone-Umeclidin-Vilant (TRELEGY ELLIPTA) 100-62.5-25 MCG/ACT AEPB, Inhale 1 puff into the lungs daily., Disp: 1 each, Rfl: 0   insulin aspart (NOVOLOG FLEXPEN) 100 UNIT/ML FlexPen, Inject 20-25 Units into the skin 3 (three) times daily with meals. 3 times a day (just before each meal), 30-35-35 units, and pen needles 3/day, Disp: 75 mL, Rfl: 3   insulin glargine (LANTUS SOLOSTAR) 100 UNIT/ML Solostar Pen, Inject 20 Units into the skin at bedtime. (Patient taking differently: Inject 10 Units into the skin at bedtime.), Disp: 15 mL, Rfl: 3   Insulin Pen Needle (BD PEN NEEDLE NANO 2ND GEN) 32G X 4 MM MISC, USE AS INSTRUCTED TO ADMINISTER INSULIN 5X DAILY, Disp: 500 each, Rfl: 1   isosorbide mononitrate (IMDUR) 30 MG 24 hr tablet, Take 1 tablet (30 mg total) by mouth daily., Disp: 90 tablet, Rfl: 3   losartan (COZAAR) 50 MG tablet, TAKE 1 TABLET (50 MG) BY MOUTH IN THE MORNING AND AT BEDTIME, Disp: 180 tablet, Rfl: 3   montelukast (SINGULAIR) 10 MG tablet, Take 1 tablet (10 mg total) by mouth at bedtime., Disp: 30 tablet, Rfl: 11   nitroGLYCERIN (NITROSTAT) 0.4 MG SL tablet, Place 1 tablet (0.4 mg total) under the tongue every 5 (five) minutes as needed for chest pain (if pain does not resolve with 2nd dose- seek care)., Disp: 20 tablet, Rfl: 5   ondansetron (ZOFRAN-ODT) 4 MG  disintegrating tablet, Take 1 tablet (4 mg total) by mouth every 8 (eight) hours as needed for up to 20 doses for nausea or vomiting., Disp: 20 tablet, Rfl: 0   ONETOUCH VERIO test strip, CHECK LEVELS 4 TIMES DAILY, Disp: 100 strip, Rfl: 2   Pitavastatin Calcium (LIVALO) 1 MG TABS, Take 1 tablet (1 mg total) by mouth daily., Disp: 90 tablet, Rfl: 3   torsemide (DEMADEX) 20 MG tablet, MUST MAKE APPOINTMENT FOR FUTURE REFILL LAST ATTEMPT. TAKE 2 TABLETS BY MOUTH EVERY DAY (Patient taking differently: Take 40 mg by mouth daily as needed (For fluid).), Disp: 30 tablet, Rfl: 0   metoprolol succinate (TOPROL-XL) 50 MG 24 hr tablet, Take 1.5 tablets (75 mg total) by mouth at bedtime. Take with or immediately following a meal. (Patient not taking: Reported on 06/03/2022), Disp: 135 tablet, Rfl: 3

## 2022-06-03 NOTE — Patient Instructions (Addendum)
I would like you to take trelegy ellipta 1 puff daily and monitor for improvement in your cough, mucous production, shortness of breath and chest discomfort  If you notice improvement please let us know and we will send in a prescription for you to continue this medication.   We will continue to monitor for reflux disease and consider having you evaluated by our GI team.   Follow up in 3 months

## 2022-06-05 ENCOUNTER — Encounter: Payer: Self-pay | Admitting: Pulmonary Disease

## 2022-06-18 ENCOUNTER — Encounter: Payer: Self-pay | Admitting: Family Medicine

## 2022-06-18 ENCOUNTER — Ambulatory Visit (INDEPENDENT_AMBULATORY_CARE_PROVIDER_SITE_OTHER): Payer: Medicare Other | Admitting: Family Medicine

## 2022-06-18 VITALS — BP 122/80 | HR 72 | Temp 97.4°F | Ht 72.0 in | Wt 250.8 lb

## 2022-06-18 DIAGNOSIS — E1169 Type 2 diabetes mellitus with other specified complication: Secondary | ICD-10-CM

## 2022-06-18 DIAGNOSIS — I10 Essential (primary) hypertension: Secondary | ICD-10-CM

## 2022-06-18 DIAGNOSIS — M869 Osteomyelitis, unspecified: Secondary | ICD-10-CM | POA: Diagnosis not present

## 2022-06-18 DIAGNOSIS — I700 Atherosclerosis of aorta: Secondary | ICD-10-CM

## 2022-06-18 DIAGNOSIS — G720 Drug-induced myopathy: Secondary | ICD-10-CM | POA: Insufficient documentation

## 2022-06-18 DIAGNOSIS — I2510 Atherosclerotic heart disease of native coronary artery without angina pectoris: Secondary | ICD-10-CM

## 2022-06-18 DIAGNOSIS — R011 Cardiac murmur, unspecified: Secondary | ICD-10-CM

## 2022-06-18 DIAGNOSIS — E785 Hyperlipidemia, unspecified: Secondary | ICD-10-CM

## 2022-06-18 NOTE — Patient Instructions (Addendum)
Let us know when you get your [facility] vaccine at the pharmacy.  Hospice grief counseling- [phone]- I think that is a great idea to pursue- thanks for bringing that up  -we could also do a general therapy referral if you would like in the future  Call ID for follow up - Dr. Tommy Medal- I'm checking some labs that he checked last time  Schedule lab visit for tomorrow  Recommended follow up: Return in about 27 weeks (around 12/24/2022) for physical or sooner if needed.Schedule b4 you leave.
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
## 2022-06-19 ENCOUNTER — Other Ambulatory Visit (INDEPENDENT_AMBULATORY_CARE_PROVIDER_SITE_OTHER): Payer: Medicare Other

## 2022-06-19 DIAGNOSIS — M869 Osteomyelitis, unspecified: Secondary | ICD-10-CM

## 2022-06-19 LAB — CBC WITH DIFFERENTIAL/PLATELET
Basophils Absolute: 0 10*3/uL (ref 0.0–0.1)
Basophils Relative: 0.4 % (ref 0.0–3.0)
Eosinophils Absolute: 0.1 10*3/uL (ref 0.0–0.7)
Eosinophils Relative: 2 % (ref 0.0–5.0)
HCT: 38.8 % — ABNORMAL LOW (ref 39.0–52.0)
Hemoglobin: 12.9 g/dL — ABNORMAL LOW (ref 13.0–17.0)
Lymphocytes Relative: 31.9 % (ref 12.0–46.0)
Lymphs Abs: 2.1 10*3/uL (ref 0.7–4.0)
MCHC: 33.2 g/dL (ref 30.0–36.0)
MCV: 91.3 fl (ref 78.0–100.0)
Monocytes Absolute: 0.4 10*3/uL (ref 0.1–1.0)
Monocytes Relative: 6.2 % (ref 3.0–12.0)
Neutro Abs: 4 10*3/uL (ref 1.4–7.7)
Neutrophils Relative %: 59.5 % (ref 43.0–77.0)
Platelets: 259 10*3/uL (ref 150.0–400.0)
RBC: 4.25 Mil/uL (ref 4.22–5.81)
RDW: 14.7 % (ref 11.5–15.5)
WBC: 6.7 10*3/uL (ref 4.0–10.5)

## 2022-06-19 NOTE — Progress Notes (Signed)
Cardiology Clinic Note   Patient Name: Martin [name] [facility] Sr. Date of Encounter: 06/20/2022  Primary Care Provider:  Shelva Majestic, MD Primary Cardiologist:  Nicki Guadalajara, MD  Patient Profile    Martin [name] Sr. 77 year old male presents the clinic today for follow-up evaluation of his coronary artery disease, hyperlipidemia, and hypertension.  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 (HCC) 05/08/2020   Simple chronic bronchitis (HCC) 05/08/2020   Tobacco abuse    Past Surgical History:  Procedure Laterality Date   CORONARY ARTERY BYPASS GRAFT N/A 10/01/2018   Procedure: CORONARY ARTERY BYPASS GRAFTING (CABG) TIMES  FOUR USING LEFT MAMMARY ARTERY AND RIGHT GREATER SAPHEANOUS VEIN HARVESTED ENDOSCOPICALLY;  Surgeon: Loreli Slot, MD;  Location: [facility] OR;  Service: Open Heart Surgery;  Laterality: N/A;   HERNIA REPAIR     >10 years ago   IR ANGIOGRAM EXTREMITY LEFT  06/16/2020   IR RADIOLOGIST EVAL & MGMT  12/24/2016   IR RADIOLOGIST EVAL & MGMT  05/30/2020   IR RADIOLOGIST EVAL & MGMT  07/13/2020   IR RADIOLOGIST EVAL & MGMT  09/06/2020   IR RADIOLOGIST EVAL & MGMT  12/26/2020   IR THORACENTESIS ASP PLEURAL SPACE W/IMG GUIDE  10/06/2018   IR US GUIDE VASC ACCESS RIGHT  06/16/2020   LEFT HEART CATH AND CORONARY ANGIOGRAPHY N/A 09/29/2018   Procedure: LEFT HEART CATH AND CORONARY ANGIOGRAPHY;  Surgeon: Lennette Bihari, MD;  Location: [facility] INVASIVE CV LAB;  Service: Cardiovascular;  Laterality: N/A;   open heart surfery     right shoulder surgery     around 2014   TEE WITHOUT CARDIOVERSION N/A 10/01/2018   Procedure: TRANSESOPHAGEAL ECHOCARDIOGRAM (TEE);  Surgeon: Loreli Slot, MD;  Location: [facility] OR;   Service: Open Heart Surgery;  Laterality: N/A;    Allergies  Allergies  Allergen Reactions   Jardiance [Empagliflozin] Nausea And Vomiting   Simvastatin Diarrhea   Statins Other (See Comments)    achey joints   Xanax [Alprazolam] Anxiety    History of Present Illness    Martin SHAMI Sr. has a PMH of coronary artery disease, hyperlipidemia, PAD, HTN, aortic atherosclerosis, cardiac murmur, statin intolerance, chronic cough, osteomyelitis fifth toe left foot, diabetes, CKD stage III, nocturia, and iliac artery aneurysm.  His PMH also includes CABG with LIMA-LAD, SVG-D1, SVG-PDA, and Y graft off PDA - PLA 09/29/2018.  His echocardiogram showed an EF of 55-60% and no significant valvular disease.  He follows with Dr. Tresa Endo.  He was seen in follow-up by Dr. Bjorn Pippin on 05/03/2022 for evaluation of chest discomfort.  He reported that he was under an increased amount of stress due to his wife passing away from metastatic breast cancer.  He woke up 1 month ago with chest discomfort.  He went to the emergency department.  His workup was negative.  He had troponins which were negative x 2.  His chest pain lasted 45-60 minutes.  He reported improvement with nitroglycerin.  He had episodes of chest pain 2 days prior to the visit which resolved with nitroglycerin.  He reported being followed by pulmonology who had recently started treatment for COPD  exacerbation.  His echocardiogram 05/29/2022 showed an EF of 65-70%, G1 DD, trivial aortic valve regurgitation and mild aortic valve stenosis.  He underwent nuclear stress testing on 05/29/2022 which showed no ischemia and low risk.  EF was noted to be 61%.  He was seen in follow-up by his PCP 06/18/2022.  He reported that he he felt his metoprolol was causing anxiety.  He had stopped the medication and felt much better.  He was also noted to have a paradoxical response to Xanax causing worsening anxiety.  He continued to have chronic cough since 11/21.  Of note normal  chest x-ray 06/30/2020.  His blood pressure was fairly well-controlled.  He presents to the clinic today for follow-up evaluation and states he noted increased anxiety with metoprolol dosing.  He reports that after starting Imdur his blood pressure was much better controlled and over the last several weeks he has felt much better.  We reviewed his echocardiogram and recent nuclear stress testing.  He expressed understanding.  He has been taking his torsemide every other day and as needed.  He reports that prior to having osteomyelitis in his left foot he was following with interventional radiology for lower extremity atherosclerosis.  I recommended that he reach out for further evaluation.  He also indicates that he was seen pharmacy lipid clinic for help with the cost of Repatha.  He has not heard anything about programs for this year.  We will reach out to pharmacy to help and contact the patient.  We will continue his current medication regimen.  Will plan for follow-up in 6 months.  Today he denies chest pain, shortness of breath, lower extremity edema, fatigue, palpitations, melena, hematuria, hemoptysis, diaphoresis, weakness, presyncope, syncope, orthopnea, and PND.    Home Medications    Prior to Admission medications   Medication Sig Start Date End Date Taking? Authorizing Provider  aspirin 81 MG EC tablet Take 1 tablet (81 mg total) by mouth daily. 02/04/19   Lennette Bihari, MD  azelastine (ASTELIN) 0.1 % nasal spray Place 1 spray into both nostrils 2 (two) times daily. 08/16/20   Shelva Majestic, MD  clopidogrel (PLAVIX) 75 MG tablet TAKE 1 TABLET BY MOUTH EVERY DAY 04/12/22   Shelva Majestic, MD  Continuous Blood Gluc Receiver (FREESTYLE LIBRE 2 READER) DEVI See admin instructions. 12/18/20   [provider]  Continuous Blood Gluc Sensor (FREESTYLE LIBRE 2 SENSOR) MISC 1 Device by Does not apply route every 14 (fourteen) days. 03/18/22   Carlus Pavlov, MD  empagliflozin  (JARDIANCE) 10 MG TABS tablet Take 1 tablet (10 mg total) by mouth daily before breakfast. 03/18/22   Carlus Pavlov, MD  ezetimibe (ZETIA) 10 MG tablet Take 1 tablet (10 mg total) by mouth daily. 12/18/21   Shelva Majestic, MD  fluticasone Aleda Grana) 50 MCG/ACT nasal spray Place 2 sprays into both nostrils daily as needed for allergies or rhinitis.    [provider]  Fluticasone-Umeclidin-Vilant (TRELEGY ELLIPTA) 100-62.5-25 MCG/ACT AEPB Inhale 1 puff into the lungs daily. 06/03/22   Martina Sinner, MD  insulin aspart (NOVOLOG FLEXPEN) 100 UNIT/ML FlexPen Inject 20-25 Units into the skin 3 (three) times daily with meals. 3 times a day (just before each meal), 30-35-35 units, and pen needles 3/day 03/18/22   Carlus Pavlov, MD  insulin glargine (LANTUS SOLOSTAR) 100 UNIT/ML Solostar Pen Inject 20 Units into the skin at bedtime. Patient taking differently: Inject 10 Units into the skin at bedtime. 03/18/22   Gherghe,  Silvestre Mesi, MD  Insulin Pen Needle (BD PEN NEEDLE NANO 2ND GEN) 32G X 4 MM MISC USE AS INSTRUCTED TO ADMINISTER INSULIN 5X DAILY 04/04/22   Carlus Pavlov, MD  isosorbide mononitrate (IMDUR) 30 MG 24 hr tablet Take 1 tablet (30 mg total) by mouth daily. 05/03/22 04/28/23  Little Ishikawa, MD  losartan (COZAAR) 50 MG tablet TAKE 1 TABLET (50 MG) BY MOUTH IN THE MORNING AND AT BEDTIME 04/12/22   Shelva Majestic, MD  metoprolol succinate (TOPROL-XL) 50 MG 24 hr tablet Take 1.5 tablets (75 mg total) by mouth at bedtime. Take with or immediately following a meal. Patient not taking: Reported on 06/03/2022 05/03/22   Little Ishikawa, MD  montelukast (SINGULAIR) 10 MG tablet Take 1 tablet (10 mg total) by mouth at bedtime. 12/03/21   Martina Sinner, MD  nitroGLYCERIN (NITROSTAT) 0.4 MG SL tablet Place 1 tablet (0.4 mg total) under the tongue every 5 (five) minutes as needed for chest pain (if pain does not resolve with 2nd dose- seek care). 04/12/22   Shelva Majestic, MD  ondansetron (ZOFRAN-ODT) 4 MG disintegrating tablet Take 1 tablet (4 mg total) by mouth every 8 (eight) hours as needed for up to 20 doses for nausea or vomiting. Patient not taking: Reported on 06/18/2022 04/12/22   Shelva Majestic, MD  [facility] VERIO test strip CHECK LEVELS 4 TIMES DAILY 10/20/20   Romero Belling, MD  Pitavastatin Calcium (LIVALO) 1 MG TABS Take 1 tablet (1 mg total) by mouth daily. 03/12/21   Lennette Bihari, MD  torsemide (DEMADEX) 20 MG tablet MUST MAKE APPOINTMENT FOR FUTURE REFILL LAST ATTEMPT. TAKE 2 TABLETS BY MOUTH EVERY DAY Patient taking differently: Take 40 mg by mouth daily as needed (For fluid). 12/01/20   Lennette Bihari, MD  DAPTOmycin (CUBICIN) 500 MG injection Inject into the vein. Patient not taking: Reported on 10/22/2020 08/23/20   [provider]  DAPTOmycin 350 MG SOLR Inject into the vein. Patient not taking: No sig reported    [provider]    Family History    Family History  Problem Relation Age of Onset   Hypertension Mother    Heart disease Mother        CHF did not see doctor   Diabetes Maternal Grandmother    Diabetes Son    He indicated that his mother is deceased. He indicated that his father is deceased. He indicated that the status of his maternal grandmother is unknown. He indicated that the status of his son is unknown.  Social History    Social History   Socioeconomic History   Marital status: Married    Spouse name: Not on file   Number of children: Not on file   Years of education: Not on file   Highest education level: Not on file  Occupational History   Not on file  Tobacco Use   Smoking status: Former    Packs/day: 0.75    Years: 2.00    Total pack years: 1.50    Types: Cigarettes    Start date: 2002    Quit date: 04/29/2002    Years since quitting: 20.1   Smokeless tobacco: Never   Tobacco comments:    smoked for 10 years on and off  Substance and Sexual Activity   Alcohol use: Yes     Comment: occasionally   Drug use: No   Sexual activity: Yes  Other Topics Concern   Not on file  Social History Narrative   Married. 3 kids. 7 grandkids. No greatgrandkids.       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  Cardiovascular:  No chest pain, dyspnea on exertion, edema, orthopnea, palpitations, paroxysmal nocturnal dyspnea. Dermatological: No rash, lesions/masses Respiratory: No cough, dyspnea Urologic: No hematuria, dysuria Abdominal:   No nausea, vomiting, diarrhea, bright red blood per rectum, melena, or hematemesis Neurologic:  No visual changes, wkns, changes in mental status. All other systems reviewed and are otherwise negative except as noted above.  Physical Exam    VS:  BP 136/84   Pulse 84   Ht 6' (1.829 m)   Wt 254 lb (115.2 kg)   BMI 34.45 kg/m  , BMI Body mass index is 34.45 kg/m. GEN: Well nourished, well developed, in no acute distress. HEENT: normal. Neck: Supple, no JVD, carotid bruits, or masses. Cardiac: RRR, 3/6 systolic murmur heard along right sternal border., rubs, or gallops. No clubbing, cyanosis, edema.  Radials/DP/PT 2+ and equal bilaterally.  Respiratory:  Respirations regular and unlabored, clear to auscultation bilaterally. GI: Soft, nontender, nondistended, BS + x 4. MS: no deformity or atrophy. Skin: warm and dry, no rash. Neuro:  Strength and sensation are intact. Psych: Normal affect.  Accessory Clinical Findings    Recent Labs: 06/19/2022: ALT 8; BUN 28; Creatinine, Ser 1.36; Hemoglobin 12.9; Platelets  259.0; Potassium 5.3 No hemolysis seen; Sodium 142   Recent Lipid Panel    Component Value Date/Time   CHOL 156 09/25/2021 1422   TRIG 120.0 09/25/2021 1422   HDL 37.80 (L) 09/25/2021 1422   CHOLHDL 4 09/25/2021 1422   VLDL 24.0 09/25/2021 1422   LDLCALC 94 09/25/2021 1422   LDLDIRECT 173.0 07/01/2018 1118         ECG personally reviewed by me today-none today.  Echocardiogram 05/29/2022  Indications:    R01.1 Murmur    History:        Patient has prior history of Echocardiogram examinations,  most                 recent 10/01/2018. CAD and Previous Myocardial Infarction,  Prior                 CABG, PAD, Arrythmias:Atrial Fibrillation,  Signs/Symptoms:Chest                 Pain; Risk Factors:Hypertension, Diabetes, Dyslipidemia  and                 Former Smoker. Chronic broncitis.    Sonographer:    Cathie Beams RCS  Referring Phys: [id number] CHRISTOPHER L SCHUMANN   IMPRESSIONS     1. Left ventricular ejection fraction, by estimation, is 65 to 70%. The  left ventricle has normal function. The left ventricle has no regional  wall motion abnormalities. Left ventricular diastolic parameters are  consistent with Grade I diastolic  dysfunction (impaired relaxation).   2. Right ventricular systolic function is normal. The right ventricular  size is normal.   3. The mitral  valve is normal in structure. Trivial mitral valve  regurgitation. No evidence of mitral stenosis.   4. The aortic valve is calcified. Aortic valve regurgitation is trivial.  Mild aortic valve stenosis. Aortic valve area, by VTI measures 1.72 cm.  Aortic valve mean gradient measures 9.0 mmHg. Aortic valve Vmax measures  2.01 m/s.   5. The inferior vena cava is normal in size with greater than 50%  respiratory variability, suggesting right atrial pressure of 3 mmHg.   FINDINGS   Left Ventricle: Left ventricular ejection fraction, by estimation, is 65  to 70%. The left ventricle has normal function.  The left ventricle has no  regional wall motion abnormalities. The left ventricular internal cavity  size was normal in size. There is   no left ventricular hypertrophy. Left ventricular diastolic parameters  are consistent with Grade I diastolic dysfunction (impaired relaxation).   Right Ventricle: The right ventricular size is normal. No increase in  right ventricular wall thickness. Right ventricular systolic function is  normal.   Left Atrium: Left atrial size was normal in size.   Right Atrium: Right atrial size was normal in size.   Pericardium: There is no evidence of pericardial effusion.   Mitral Valve: The mitral valve is normal in structure. Mild mitral annular  calcification. Trivial mitral valve regurgitation. No evidence of mitral  valve stenosis.   Tricuspid Valve: The tricuspid valve is normal in structure. Tricuspid  valve regurgitation is not demonstrated. No evidence of tricuspid  stenosis.   Aortic Valve: The aortic valve is calcified. Aortic valve regurgitation is  trivial. Mild aortic stenosis is present. Aortic valve mean gradient  measures 9.0 mmHg. Aortic valve peak gradient measures 16.2 mmHg. Aortic  valve area, by VTI measures 1.72 cm.   Pulmonic Valve: The pulmonic valve was normal in structure. Pulmonic valve  regurgitation is not visualized. No evidence of pulmonic stenosis.   Aorta: The aortic root is normal in size and structure.   Venous: The inferior vena cava is normal in size with greater than 50%  respiratory variability, suggesting right atrial pressure of 3 mmHg.   IAS/Shunts: No atrial level shunt detected by color flow Doppler.     Nuclear stress test 05/29/2022     Findings are consistent with no ischemia. The study is low risk.   No ST deviation was noted.   LV perfusion is abnormal. There is no evidence of ischemia. There is no evidence of infarction. Defect 1: There is a medium defect with mild reduction in uptake present in  the apical to mid inferior and inferolateral location(s) that is fixed. There is normal wall motion in the defect area. Consistent with artifact.   Left ventricular function is normal. Nuclear stress EF: 61 %. The left ventricular ejection fraction is normal (55-65%). End diastolic cavity size is normal. End systolic cavity size is normal.   Prior study not available for comparison.   There is an area of mildly reduced perfusion at rest in the mid to apical inferior and inferolateral wall. This improves with stress. Given low counts of study and extracardiac activity, suspect this is artifact.  Assessment & Plan   1.  Chest discomfort-no chest discomfort today.  Reviewed recent testing.  He expressed understanding.  Reassured that his chest discomfort was not related to cardiac issues. Increase physical activity as tolerated Heart healthy low-sodium diet  Coronary artery disease-no chest pain today.  Reports less chest discomfort with addition of Imdur.  Recent nuclear stress test 05/29/2022  showed no ischemia and low risk.  Echocardiogram showed an LVEF of 65-70%, G1 DD, trivial aortic valve regurgitation and mild aortic valve stenosis. Continue ASA, clopidogrel, ezetimibe, Livalo, Imdur Heart healthy low-sodium diet-salty 6 given Increase physical activity as tolerated  Hyperlipidemia- 09/25/2021: Cholesterol 156; HDL 37.80; LDL Cholesterol 94; Triglycerides 120.0; VLDL 24.0 Continue aspirin, Plavix, ezetimibe, Livalo Heart healthy low-sodium diet-salty 6 given Increase physical activity as tolerated Pharmacy about Repatha for cost-will reach out and have them contact patient.  Essential hypertension-BP today 136/84 Continue losartan, torsemide, Imdur Heart healthy low-sodium diet-salty 6 given Increase physical activity as tolerated  Cardiac murmur-no increased DOE or activity intolerance.  Noted to have trivial aortic valve regurgitation on recent echocardiogram with mild aortic valve  stenosis.  2/6 systolic murmur heard along right sternal border.  Disposition: Follow-up with Dr. Tresa Endo or me in 6 months.   Thomasene Ripple. Keasia Dubose NP-C     06/20/2022, 9:48 AM [facility] [address] Office [phone] Fax [phone]    I spent 15 minutes examining this patient, reviewing medications, and using patient centered shared decision making involving her cardiac care.  Prior to her visit I spent greater than 20 minutes reviewing her past medical history,  medications, and prior cardiac tests.

## 2022-06-20 ENCOUNTER — Ambulatory Visit: Payer: Medicare Other | Attending: General Practice | Admitting: General Practice

## 2022-06-20 ENCOUNTER — Encounter: Payer: Self-pay | Admitting: General Practice

## 2022-06-20 VITALS — BP 136/84 | HR 84 | Ht 72.0 in | Wt 254.0 lb

## 2022-06-20 DIAGNOSIS — R079 Chest pain, unspecified: Secondary | ICD-10-CM

## 2022-06-20 DIAGNOSIS — E785 Hyperlipidemia, unspecified: Secondary | ICD-10-CM

## 2022-06-20 DIAGNOSIS — I10 Essential (primary) hypertension: Secondary | ICD-10-CM | POA: Diagnosis not present

## 2022-06-20 DIAGNOSIS — E1169 Type 2 diabetes mellitus with other specified complication: Secondary | ICD-10-CM

## 2022-06-20 DIAGNOSIS — R011 Cardiac murmur, unspecified: Secondary | ICD-10-CM

## 2022-06-20 DIAGNOSIS — I2510 Atherosclerotic heart disease of native coronary artery without angina pectoris: Secondary | ICD-10-CM | POA: Diagnosis not present

## 2022-06-20 LAB — COMPREHENSIVE METABOLIC PANEL
ALT: 8 U/L (ref 0–53)
AST: 11 U/L (ref 0–37)
Albumin: 4.5 g/dL (ref 3.5–5.2)
Alkaline Phosphatase: 57 U/L (ref 39–117)
BUN: 28 mg/dL — ABNORMAL HIGH (ref 6–23)
CO2: 28 mEq/L (ref 19–32)
Calcium: 9.6 mg/dL (ref 8.4–10.5)
Chloride: 103 mEq/L (ref 96–112)
Creatinine, Ser: 1.36 mg/dL (ref 0.40–1.50)
GFR: 50.07 mL/min — ABNORMAL LOW (ref >60.00)
Glucose, Bld: 236 mg/dL — ABNORMAL HIGH (ref 70–99)
Potassium: 5.3 mEq/L — ABNORMAL HIGH (ref 3.5–5.1)
Sodium: 142 mEq/L (ref 135–145)
Total Bilirubin: 1.3 mg/dL — ABNORMAL HIGH (ref 0.2–1.2)
Total Protein: 7.4 g/dL (ref 6.0–8.3)

## 2022-06-20 LAB — C-REACTIVE PROTEIN: CRP: <1 mg/dL (ref 0.5–20.0)

## 2022-06-20 NOTE — Patient Instructions (Signed)
Medication Instructions:  The current medical regimen is effective;  continue present plan and medications as directed. Please refer to the Current Medication list given to you today.  *If you need a refill on your cardiac medications before your next appointment, please call your pharmacy*  Lab Work: NONE If you have labs (blood work) drawn today and your tests are completely normal, you will receive your results only by: [name] (if you have MyChart) OR  A paper copy in the mail If you have any lab test that is abnormal or we need to change your treatment, we will call you to review the results.  Testing/Procedures: NONE  Other Instructions OK FOR STATIONERY BIKE  INCREASE PHYSICAL ACTIVITY-AS TOLERATED   Follow-Up: At [facility], you and your health needs are our priority.  As part of our continuing mission to provide you with exceptional heart care, we have created designated Provider Care Teams.  These Care Teams include your primary Cardiologist (physician) and Advanced Practice Providers (APPs -  Physician Assistants and Nurse Practitioners) who all work together to provide you with the care you need, when you need it.  We recommend signing up for the patient portal called "MyChart".  Sign up information is provided on this After Visit Summary.  MyChart is used to connect with patients for Virtual Visits (Telemedicine).  Patients are able to view lab/test results, encounter notes, upcoming appointments, etc.  Non-urgent messages can be sent to your provider as well.   To learn more about what you can do with MyChart, go to [email or web address].    Your next appointment:   6 month(s)  Provider:   Shelva Majestic, MD

## 2022-06-26 ENCOUNTER — Telehealth: Payer: Self-pay | Admitting: Pharmacist Clinician (PhC)/ Clinical Pharmacy Specialist

## 2022-06-26 DIAGNOSIS — E1169 Type 2 diabetes mellitus with other specified complication: Secondary | ICD-10-CM

## 2022-06-26 NOTE — Telephone Encounter (Signed)
Patient was seen for lipid clinic 10/2021 but unable to afford [place] was closed at that time.  He was in to see Coletta Memos last week and would still like to use Repatha.    LMOM for patient to return call, we can set him up for the grant and get Repatha started.

## 2022-07-01 ENCOUNTER — Encounter: Payer: Self-pay | Admitting: Pulmonary Disease

## 2022-07-01 MED ORDER — TRELEGY ELLIPTA 100-62.5-25 MCG/ACT IN AEPB: 1.0000 | INHALATION_SPRAY | Freq: Every day | RESPIRATORY_TRACT | 5 refills | Status: DC

## 2022-07-04 ENCOUNTER — Other Ambulatory Visit: Payer: Self-pay | Admitting: Pharmacist Clinician (PhC)/ Clinical Pharmacy Specialist

## 2022-07-04 DIAGNOSIS — E1169 Type 2 diabetes mellitus with other specified complication: Secondary | ICD-10-CM

## 2022-07-04 NOTE — Telephone Encounter (Signed)
Spoke with patient.  He would like to try Repatha now that the [facility] is open.  Need updated labs, we originally spoke of this in July 2023.    He will get labs done and call when that has been done.   Will submit PA for Repatha at that time then apply for [facility]

## 2022-07-22 ENCOUNTER — Encounter: Payer: Self-pay | Admitting: Internal Medicine

## 2022-07-22 ENCOUNTER — Ambulatory Visit: Payer: Medicare Other | Admitting: Internal Medicine

## 2022-07-22 VITALS — BP 132/80 | HR 79 | Ht 72.0 in | Wt 265.2 lb

## 2022-07-22 DIAGNOSIS — Z794 Long term (current) use of insulin: Secondary | ICD-10-CM

## 2022-07-22 DIAGNOSIS — E785 Hyperlipidemia, unspecified: Secondary | ICD-10-CM | POA: Diagnosis not present

## 2022-07-22 DIAGNOSIS — E1151 Type 2 diabetes mellitus with diabetic peripheral angiopathy without gangrene: Secondary | ICD-10-CM | POA: Diagnosis not present

## 2022-07-22 DIAGNOSIS — E1169 Type 2 diabetes mellitus with other specified complication: Secondary | ICD-10-CM | POA: Diagnosis not present

## 2022-07-22 LAB — POCT GLYCOSYLATED HEMOGLOBIN (HGB A1C): Hemoglobin A1C: 7.9 % — AB (ref 4.0–5.6)

## 2022-07-22 MED ORDER — HUMALOG KWIKPEN 200 UNIT/ML ~~LOC~~ SOPN: 15.0000 [IU] | PEN_INJECTOR | Freq: Three times a day (TID) | SUBCUTANEOUS | 3 refills | Status: DC

## 2022-07-22 MED ORDER — LANTUS SOLOSTAR 100 UNIT/ML ~~LOC~~ SOPN: 35.0000 [IU] | PEN_INJECTOR | Freq: Every day | SUBCUTANEOUS | 3 refills | Status: DC

## 2022-07-22 NOTE — Progress Notes (Addendum)
Patient ID: Martin Agee Sr., male   DOB: 1945-07-31, 77 y.o.   MRN: [id number]  HPI: Martin WEITMAN Sr. is a 77 y.o.-year-old male, returning for follow-up for DM2, dx in 2004, insulin-dependent since 2013, uncontrolled, with complications (CAD - s/p AMI, s/p CABG, PAD - s/p angioplasty, A fib, CKD, DR, PN). Pt. previously saw Dr. Loanne Drilling, but last visit with me 4 months ago.  Interim history: His wife died 1 mo prior to our last visit.  He was grieving and under a lot of stress.  He was not eating well.  Sugars were higher.  They improved since then. He developed nausea/vomiting/abdominal pain after our last visit and he attributes the symptoms to [place].  Ambulance had to take him to the hospital in the middle of the night in 03/2022.  No increased urination, blurry vision, nausea, chest pain.  He lost  approximately 7 pounds since last visit.  Reviewed HbA1c: Lab Results  Component Value Date   HGBA1C 8.9 (A) 03/18/2022   HGBA1C 8.3 (A) 08/20/2021   HGBA1C 8.1 (A) 06/04/2021   HGBA1C 9.1 (A) 12/18/2020   HGBA1C 8.1 (H) 08/16/2020   HGBA1C 8.6 (H) 02/09/2020   HGBA1C 7.6 (H) 07/08/2019   HGBA1C 8.2 (H) 01/06/2019   HGBA1C 8.1 (H) 09/29/2018   HGBA1C 7.7 (A) 05/20/2018   At last visit he was on: - Novolog 30 units 3x a day with meals, including in the middle of the night He was on Lantus before, sugars were better controlled then.  I advised him to change to: - >> AP/nausea/vomiting >> stopped - Lantus 20 >> 25 units at bedtime - Novolog 20-25 >> 15-20 units 15 min before the 3 meals  Pt checks his sugars >4x a day and they are:  Previously:  Lowest sugar was >100 >> 70; he has hypoglycemia awareness at 100.  Highest sugar was 200s >> 300s  Glucometer: One Touch Verio  - + CKD, last BUN/creatinine:  Lab Results  Component Value Date   BUN 28 (H) 06/19/2022   BUN 28 (H) 04/03/2022   CREATININE 1.36 06/19/2022   CREATININE 1.53 (H) 04/03/2022  On Cozaar 50 mg  daily.  - + HL; last set of lipids: Lab Results  Component Value Date   CHOL 156 09/25/2021   HDL 37.80 (L) 09/25/2021   LDLCALC 94 09/25/2021   LDLDIRECT 173.0 07/01/2018   TRIG 120.0 09/25/2021   CHOLHDL 4 09/25/2021  On Livalo 1 mg daily, Zetia 10 mg daily.  - last eye exam was in 10/25/2021. No DR.   - no numbness and tingling in his feet.  Last foot exam was 02/20/2021. He has history of osteomyelitis of his left toe, but this improved after his angioplasty procedure.  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   Tobacco abuse    Past Surgical History:  Procedure Laterality Date   CORONARY ARTERY BYPASS GRAFT N/A 10/01/2018   Procedure: CORONARY ARTERY BYPASS GRAFTING (CABG) TIMES  FOUR USING LEFT MAMMARY ARTERY AND RIGHT GREATER SAPHEANOUS VEIN HARVESTED ENDOSCOPICALLY;  Surgeon: Melrose Nakayama, MD;  Location: [facility];  Service: Open Heart Surgery;  Laterality: N/A;   HERNIA REPAIR     >10 years ago   IR ANGIOGRAM EXTREMITY LEFT  06/16/2020   IR RADIOLOGIST EVAL & MGMT  12/24/2016   IR RADIOLOGIST EVAL & MGMT  05/30/2020   IR RADIOLOGIST EVAL & MGMT  07/13/2020   IR RADIOLOGIST EVAL & MGMT  09/06/2020   IR RADIOLOGIST EVAL & MGMT  12/26/2020   IR THORACENTESIS ASP PLEURAL SPACE W/IMG GUIDE  10/06/2018   IR US GUIDE VASC ACCESS RIGHT  06/16/2020   LEFT HEART CATH AND CORONARY ANGIOGRAPHY N/A 09/29/2018   Procedure: LEFT HEART CATH AND CORONARY ANGIOGRAPHY;  Surgeon: Troy Sine, MD;  Location: [facility];  Service: Cardiovascular;  Laterality: N/A;   open heart surfery     right shoulder surgery     around 2014   TEE WITHOUT CARDIOVERSION N/A  10/01/2018   Procedure: TRANSESOPHAGEAL ECHOCARDIOGRAM (TEE);  Surgeon: Melrose Nakayama, MD;  Location: [facility];  Service: Open Heart Surgery;  Laterality: N/A;   Social History   Socioeconomic History   Marital status: Married    Spouse name: Not on file   Number of children: Not on file   Years of education: Not on file   Highest education level: Not on file  Occupational History   Not on file  Tobacco Use   Smoking status: Former    Packs/day: 0.75    Years: 2.00    Additional pack years: 0.00    Total pack years: 1.50    Types: Cigarettes    Start date: 2002    Quit date: 04/29/2002    Years since quitting: 20.2   Smokeless tobacco: Never   Tobacco comments:    smoked for 10 years on and off  Substance and Sexual Activity   Alcohol use: Yes    Comment: occasionally   Drug use: No   Sexual activity: Yes  Other Topics Concern   Not on file  Social History Narrative   Married. 3 kids. 7 grandkids. No greatgrandkids.       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     azelastine (ASTELIN) 0.1 % nasal spray Place 1 spray into both nostrils 2 (two) times daily. 30 mL 12   clopidogrel (PLAVIX) 75 MG tablet TAKE 1 TABLET BY MOUTH EVERY DAY 90 tablet 3   Continuous Blood Gluc Receiver (FREESTYLE LIBRE 2 READER) DEVI See admin instructions.     Continuous Blood Gluc Sensor (FREESTYLE LIBRE 2 SENSOR) MISC 1 Device by Does not apply route every 14 (fourteen) days. 6 each 3    empagliflozin (JARDIANCE) 10 MG TABS tablet Take 1 tablet (10 mg total) by mouth daily before breakfast. 90 tablet 3   ezetimibe (ZETIA) 10 MG tablet Take 1 tablet (10 mg total) by mouth daily. 90 tablet 3   fluticasone (FLONASE) 50 MCG/ACT nasal spray Place 2 sprays into both nostrils daily as needed  for allergies or rhinitis.     Fluticasone-Umeclidin-Vilant (TRELEGY ELLIPTA) 100-62.5-25 MCG/ACT AEPB Inhale 1 puff into the lungs daily. 60 each 5   insulin aspart (NOVOLOG FLEXPEN) 100 UNIT/ML FlexPen Inject 20-25 Units into the skin 3 (three) times daily with meals. 3 times a day (just before each meal), 30-35-35 units, and pen needles 3/day 75 mL 3   insulin glargine (LANTUS SOLOSTAR) 100 UNIT/ML Solostar Pen Inject 20 Units into the skin at bedtime. (Patient taking differently: Inject 10 Units into the skin at bedtime.) 15 mL 3   Insulin Pen Needle (BD PEN NEEDLE NANO 2ND GEN) 32G X 4 MM MISC USE AS INSTRUCTED TO ADMINISTER INSULIN 5X DAILY 500 each 1   isosorbide mononitrate (IMDUR) 30 MG 24 hr tablet Take 1 tablet (30 mg total) by mouth daily. 90 tablet 3   losartan (COZAAR) 50 MG tablet TAKE 1 TABLET (50 MG) BY MOUTH IN THE MORNING AND AT BEDTIME 180 tablet 3   montelukast (SINGULAIR) 10 MG tablet Take 1 tablet (10 mg total) by mouth at bedtime. 30 tablet 11   nitroGLYCERIN (NITROSTAT) 0.4 MG SL tablet Place 1 tablet (0.4 mg total) under the tongue every 5 (five) minutes as needed for chest pain (if pain does not resolve with 2nd dose- seek care). 20 tablet 5   ondansetron (ZOFRAN-ODT) 4 MG disintegrating tablet Take 1 tablet (4 mg total) by mouth every 8 (eight) hours as needed for up to 20 doses for nausea or vomiting. 20 tablet 0   ONETOUCH VERIO test strip CHECK LEVELS 4 TIMES DAILY 100 strip 2   Pitavastatin Calcium (LIVALO) 1 MG TABS Take 1 tablet (1 mg total) by mouth daily. 90 tablet 3   torsemide (DEMADEX) 20 MG tablet MUST MAKE APPOINTMENT FOR FUTURE REFILL LAST ATTEMPT. TAKE 2 TABLETS  BY MOUTH EVERY DAY (Patient taking differently: Take 40 mg by mouth daily as needed (For fluid).) 30 tablet 0   [DISCONTINUED] DAPTOmycin (CUBICIN) 500 MG injection Inject into the vein. (Patient not taking: Reported on 10/22/2020)     [DISCONTINUED] DAPTOmycin 350 MG SOLR Inject into the vein. (Patient not taking: No sig reported)     No current facility-administered medications on file prior to visit.   Allergies  Allergen Reactions   Jardiance [Empagliflozin] Nausea And Vomiting   Simvastatin Diarrhea   Statins Other (See Comments)    achey joints   Xanax [Alprazolam] Anxiety   Family History  Problem Relation Age of Onset   Hypertension Mother    Heart disease Mother        CHF did not see doctor   Diabetes Maternal Grandmother    Diabetes Son    PE: BP 132/80 (BP Location: Right Arm, Patient Position: Sitting, Cuff Size: Normal)   Pulse 79   Ht 6' (1.829 m)   Wt 265 lb 3.2 oz (120.3 kg)   SpO2 97%   BMI 35.97 kg/m  Wt Readings from Last 3 Encounters:  07/22/22 265 lb 3.2 oz (120.3 kg)  06/20/22 254 lb (115.2 kg)  06/18/22 250 lb 12.8 oz (113.8 kg)   Constitutional: overweight, in NAD Eyes:  EOMI, no exophthalmos ENT: no neck masses, no cervical lymphadenopathy Cardiovascular: RRR, No MRG Respiratory: CTA B Musculoskeletal: no deformities Skin:no rashes Neurological: no tremor with outstretched hands Diabetic Foot Exam - Simple   Simple Foot Form Diabetic Foot exam was performed with the following findings: Yes 07/22/2022  8:31 AM  Visual Inspection No deformities, no ulcerations, no other skin  breakdown bilaterally: Yes See comments: Yes Sensation Testing See comments: Yes Pulse Check See comments: Yes Comments Decreased post. Pedal pulse in R foot and no pulse palpated in L foot No sensation in forefoot B Onychodystrophy     ASSESSMENT: 1. DM2, insulin-dependent, uncontrolled, with complications - CAD - s/p AMI, s/p CABG - PAD - s/p angioplasty  02.2022 - A fib - CKD - DR - PN; h/o osteomyelitis L 5th toe  2. HL  PLAN:  1. Patient with longstanding, uncontrolled, type 2 diabetes, only on rapid acting insulin at last visit, with poor control.  HbA1c returned at 8.9% at that time.  Sugars were elevated at all times of the day, higher after lunch and dinner.  Upon questioning, he had to wake up in the middle of the night to get himself NovoLog, since sugars were in the 200s in the morning.  Sugars are gradually increasing during the night.  He was taking NovoLog 1 to 1-1/2 hours after he started eating.  At that time, I advised him to start long-acting insulin and decrease the doses of NovoLog but move them 15 minutes before the meals.  We also discussed about Jardiance.  He mentions that PCP recommended this but he was not sure whether he needed it.  We discussed about the benefits and possible side effects of Jardiance.  This was affordable for him so I advised him to start. CGM interpretation: -At today's visit, we reviewed his CGM downloads: It appears that 50% of values are in target range (goal >70%), while still percent are higher than 180 (goal <25%), and 0% are lower than 70 (goal <4%).  The calculated average blood sugar is 186.  The projected HbA1c for the next 3 months ([facility]) is 7.8%. -Reviewing the CGM trends, sugars are still above target, and increasing as the day goes by with a decrease after dinner.  Upon reviewing individual daily traces, he takes his insulin occasionally too late after lunch and also sometimes after dinner.  He is trying to take NovoLog approximately 1 hour before dinner.  We discussed that this is likely because his sugars are high after lunch and I advised him to try to take his NovoLog correctly before this meal.  After he does so, he could take the mealtime insulin only 15 minutes before dinner. -His long-acting insulin does not appear to be sufficient so I advised him to increase this.  He also tells me that  he needs to switch from NovoLog to Humalog per insurance preference.  I sent a prescription for the Humalog U200 to his pharmacy. -He tells me that he adds honey in his coffee.  He does this to help with his allergies.  I advised him that he may need 4 to 5 units of rapid acting insulin before coffee. -He had  N/V/AP after last visit, which she attributes to [place].  Regarding the chart, glucose was mostly in the 300s with a CO2 of 22, at the lower limit of the target range.  For now, my suggestion was to hold off restarting an SGLT2 inhibitor and he wholeheartedly agrees. - I suggested to:  Patient Instructions  Please increase: - Lantus 30-35 units at bedtime  Use: - Humalog U200 15-20 units 15 min before the 3 meals  May need to bolus 4-5 units before coffee.  Please return in 3-4 months.    - we checked his HbA1c: 7.9% (lower) - advised to check sugars at different times of the day -  4x a day, rotating check times - advised for yearly eye exams >> he is UTD - return to clinic in 3-4 months  2. HL -Reviewed latest lipid panel from 08/2021: LDL above our target of less than 55 due to cardiovascular disease, HDL low, triglycerides at goal: Lab Results  Component Value Date   CHOL 156 09/25/2021   HDL 37.80 (L) 09/25/2021   LDLCALC 94 09/25/2021   LDLDIRECT 173.0 07/01/2018   TRIG 120.0 09/25/2021   CHOLHDL 4 09/25/2021  -Will continue Livalo 1 mg daily and Zetia 10 mg daily.  He has no side effects.  Philemon Kingdom, MD PhD [facility] Endocrinology

## 2022-07-22 NOTE — Patient Instructions (Addendum)
Please increase: - Lantus 30-35 units at bedtime  Use: - Humalog U200 15-20 units 15 min before the 3 meals  May need to bolus 4-5 units before coffee.  Please return in 3-4 months.

## 2022-08-01 DIAGNOSIS — E1122 Type 2 diabetes mellitus with diabetic chronic kidney disease: Secondary | ICD-10-CM | POA: Diagnosis not present

## 2022-08-01 DIAGNOSIS — I2510 Atherosclerotic heart disease of native coronary artery without angina pectoris: Secondary | ICD-10-CM | POA: Diagnosis not present

## 2022-08-01 DIAGNOSIS — S90424A Blister (nonthermal), right lesser toe(s), initial encounter: Secondary | ICD-10-CM | POA: Diagnosis not present

## 2022-08-01 DIAGNOSIS — I4891 Unspecified atrial fibrillation: Secondary | ICD-10-CM | POA: Diagnosis not present

## 2022-08-01 DIAGNOSIS — L97509 Non-pressure chronic ulcer of other part of unspecified foot with unspecified severity: Secondary | ICD-10-CM | POA: Diagnosis not present

## 2022-08-01 DIAGNOSIS — Z7982 Long term (current) use of aspirin: Secondary | ICD-10-CM | POA: Diagnosis not present

## 2022-08-01 DIAGNOSIS — E1140 Type 2 diabetes mellitus with diabetic neuropathy, unspecified: Secondary | ICD-10-CM | POA: Diagnosis not present

## 2022-08-01 DIAGNOSIS — R079 Chest pain, unspecified: Secondary | ICD-10-CM | POA: Diagnosis not present

## 2022-08-01 DIAGNOSIS — E119 Type 2 diabetes mellitus without complications: Secondary | ICD-10-CM | POA: Diagnosis not present

## 2022-08-01 DIAGNOSIS — Z7902 Long term (current) use of antithrombotics/antiplatelets: Secondary | ICD-10-CM | POA: Diagnosis not present

## 2022-08-01 DIAGNOSIS — Z794 Long term (current) use of insulin: Secondary | ICD-10-CM | POA: Diagnosis not present

## 2022-08-01 DIAGNOSIS — F419 Anxiety disorder, unspecified: Secondary | ICD-10-CM | POA: Diagnosis not present

## 2022-08-01 DIAGNOSIS — X58XXXA Exposure to other specified factors, initial encounter: Secondary | ICD-10-CM | POA: Diagnosis not present

## 2022-08-01 DIAGNOSIS — E1151 Type 2 diabetes mellitus with diabetic peripheral angiopathy without gangrene: Secondary | ICD-10-CM | POA: Diagnosis not present

## 2022-08-01 DIAGNOSIS — I25119 Atherosclerotic heart disease of native coronary artery with unspecified angina pectoris: Secondary | ICD-10-CM | POA: Diagnosis not present

## 2022-08-01 DIAGNOSIS — S90821A Blister (nonthermal), right foot, initial encounter: Secondary | ICD-10-CM | POA: Diagnosis not present

## 2022-08-01 DIAGNOSIS — Z951 Presence of aortocoronary bypass graft: Secondary | ICD-10-CM | POA: Diagnosis not present

## 2022-08-01 DIAGNOSIS — Z79899 Other long term (current) drug therapy: Secondary | ICD-10-CM | POA: Diagnosis not present

## 2022-08-01 DIAGNOSIS — L97511 Non-pressure chronic ulcer of other part of right foot limited to breakdown of skin: Secondary | ICD-10-CM | POA: Diagnosis not present

## 2022-08-01 DIAGNOSIS — L97519 Non-pressure chronic ulcer of other part of right foot with unspecified severity: Secondary | ICD-10-CM | POA: Diagnosis not present

## 2022-08-01 DIAGNOSIS — E11621 Type 2 diabetes mellitus with foot ulcer: Secondary | ICD-10-CM | POA: Diagnosis not present

## 2022-08-01 DIAGNOSIS — J9811 Atelectasis: Secondary | ICD-10-CM | POA: Diagnosis not present

## 2022-08-01 DIAGNOSIS — Z9862 Peripheral vascular angioplasty status: Secondary | ICD-10-CM | POA: Diagnosis not present

## 2022-08-01 DIAGNOSIS — N183 Chronic kidney disease, stage 3 unspecified: Secondary | ICD-10-CM | POA: Diagnosis not present

## 2022-08-01 DIAGNOSIS — M19071 Primary osteoarthritis, right ankle and foot: Secondary | ICD-10-CM | POA: Diagnosis not present

## 2022-08-01 DIAGNOSIS — I129 Hypertensive chronic kidney disease with stage 1 through stage 4 chronic kidney disease, or unspecified chronic kidney disease: Secondary | ICD-10-CM | POA: Diagnosis not present

## 2022-08-01 DIAGNOSIS — M7989 Other specified soft tissue disorders: Secondary | ICD-10-CM | POA: Diagnosis not present

## 2022-08-01 DIAGNOSIS — E08621 Diabetes mellitus due to underlying condition with foot ulcer: Secondary | ICD-10-CM | POA: Diagnosis not present

## 2022-08-01 DIAGNOSIS — R0789 Other chest pain: Secondary | ICD-10-CM | POA: Diagnosis not present

## 2022-08-01 DIAGNOSIS — R072 Precordial pain: Secondary | ICD-10-CM | POA: Diagnosis not present

## 2022-08-02 DIAGNOSIS — I4891 Unspecified atrial fibrillation: Secondary | ICD-10-CM | POA: Diagnosis not present

## 2022-08-02 DIAGNOSIS — L97519 Non-pressure chronic ulcer of other part of right foot with unspecified severity: Secondary | ICD-10-CM | POA: Diagnosis not present

## 2022-08-02 DIAGNOSIS — E11621 Type 2 diabetes mellitus with foot ulcer: Secondary | ICD-10-CM | POA: Diagnosis not present

## 2022-08-02 DIAGNOSIS — Z794 Long term (current) use of insulin: Secondary | ICD-10-CM | POA: Diagnosis not present

## 2022-08-02 DIAGNOSIS — I2510 Atherosclerotic heart disease of native coronary artery without angina pectoris: Secondary | ICD-10-CM | POA: Diagnosis not present

## 2022-08-02 DIAGNOSIS — Z9862 Peripheral vascular angioplasty status: Secondary | ICD-10-CM | POA: Diagnosis not present

## 2022-08-02 DIAGNOSIS — Z7982 Long term (current) use of aspirin: Secondary | ICD-10-CM | POA: Diagnosis not present

## 2022-08-02 DIAGNOSIS — E1122 Type 2 diabetes mellitus with diabetic chronic kidney disease: Secondary | ICD-10-CM | POA: Diagnosis not present

## 2022-08-02 DIAGNOSIS — I129 Hypertensive chronic kidney disease with stage 1 through stage 4 chronic kidney disease, or unspecified chronic kidney disease: Secondary | ICD-10-CM | POA: Diagnosis not present

## 2022-08-02 DIAGNOSIS — F419 Anxiety disorder, unspecified: Secondary | ICD-10-CM | POA: Diagnosis not present

## 2022-08-02 DIAGNOSIS — Z951 Presence of aortocoronary bypass graft: Secondary | ICD-10-CM | POA: Diagnosis not present

## 2022-08-02 DIAGNOSIS — N183 Chronic kidney disease, stage 3 unspecified: Secondary | ICD-10-CM | POA: Diagnosis not present

## 2022-08-02 DIAGNOSIS — R079 Chest pain, unspecified: Secondary | ICD-10-CM | POA: Diagnosis not present

## 2022-08-02 DIAGNOSIS — E1151 Type 2 diabetes mellitus with diabetic peripheral angiopathy without gangrene: Secondary | ICD-10-CM | POA: Diagnosis not present

## 2022-08-03 DIAGNOSIS — E11621 Type 2 diabetes mellitus with foot ulcer: Secondary | ICD-10-CM | POA: Diagnosis not present

## 2022-08-03 DIAGNOSIS — N183 Chronic kidney disease, stage 3 unspecified: Secondary | ICD-10-CM | POA: Diagnosis not present

## 2022-08-03 DIAGNOSIS — L97519 Non-pressure chronic ulcer of other part of right foot with unspecified severity: Secondary | ICD-10-CM | POA: Diagnosis not present

## 2022-08-03 DIAGNOSIS — Z951 Presence of aortocoronary bypass graft: Secondary | ICD-10-CM | POA: Diagnosis not present

## 2022-08-03 DIAGNOSIS — I4891 Unspecified atrial fibrillation: Secondary | ICD-10-CM | POA: Diagnosis not present

## 2022-08-03 DIAGNOSIS — I129 Hypertensive chronic kidney disease with stage 1 through stage 4 chronic kidney disease, or unspecified chronic kidney disease: Secondary | ICD-10-CM | POA: Diagnosis not present

## 2022-08-03 DIAGNOSIS — F419 Anxiety disorder, unspecified: Secondary | ICD-10-CM | POA: Diagnosis not present

## 2022-08-03 DIAGNOSIS — I2510 Atherosclerotic heart disease of native coronary artery without angina pectoris: Secondary | ICD-10-CM | POA: Diagnosis not present

## 2022-08-03 DIAGNOSIS — Z7982 Long term (current) use of aspirin: Secondary | ICD-10-CM | POA: Diagnosis not present

## 2022-08-03 DIAGNOSIS — E1151 Type 2 diabetes mellitus with diabetic peripheral angiopathy without gangrene: Secondary | ICD-10-CM | POA: Diagnosis not present

## 2022-08-03 DIAGNOSIS — Z794 Long term (current) use of insulin: Secondary | ICD-10-CM | POA: Diagnosis not present

## 2022-08-03 DIAGNOSIS — E1122 Type 2 diabetes mellitus with diabetic chronic kidney disease: Secondary | ICD-10-CM | POA: Diagnosis not present

## 2022-08-03 DIAGNOSIS — R079 Chest pain, unspecified: Secondary | ICD-10-CM | POA: Diagnosis not present

## 2022-08-03 DIAGNOSIS — Z9862 Peripheral vascular angioplasty status: Secondary | ICD-10-CM | POA: Diagnosis not present

## 2022-08-04 ENCOUNTER — Other Ambulatory Visit: Payer: Self-pay | Admitting: Internal Medicine

## 2022-08-04 DIAGNOSIS — Z794 Long term (current) use of insulin: Secondary | ICD-10-CM

## 2022-08-05 ENCOUNTER — Other Ambulatory Visit: Payer: Self-pay | Admitting: Internal Medicine

## 2022-08-05 ENCOUNTER — Telehealth: Payer: Self-pay

## 2022-08-05 DIAGNOSIS — E1151 Type 2 diabetes mellitus with diabetic peripheral angiopathy without gangrene: Secondary | ICD-10-CM

## 2022-08-05 NOTE — Transitions of Care (Post Inpatient/ED Visit) (Signed)
08/05/2022  Name: [name] Sr. MRN: [id number] DOB: [birth year]  Today's TOC FU Call Status: Today's TOC FU Call Status:: Successful TOC FU Call Competed TOC FU Call Complete Date: 08/05/22  Transition Care Management Follow-up Telephone Call Date of Discharge: 08/03/22 Discharge Facility: Other [facility]) Name of Other (Non[facility]) Discharge Facility: [facility] Type of Discharge: Inpatient Admission Primary Inpatient Discharge Diagnosis:: "precordial pain" How have you been since you were released from the hospital?: Better (Pt reports no further pain/tightness to chest. States he has been told its anxiety-sent home on Hydroxyzine to take prn-has not needed a dose yet. Offered [facility] SW for support/counseling-grief and anxiety-he declined at present) Any questions or concerns?: Yes Patient Questions/Concerns:: pt also evaluated for blister to toe-blister remains intact-no s/s of infection noted- was advised to f/u with podiatrist-pt adamant he does not want to see podiatrist as he had bad experience in the past-wants to be referred to wound care center Patient Questions/Concerns Addressed: Notified Provider of Patient Questions/Concerns, Other: (RN CM reviewed s/s of infection,worsening condition and when to seek medical attention. Pt aware that MD would need to see and evaluate area-will discuss further during follow up appt with provider)  Items Reviewed: Did you receive and understand the discharge instructions provided?: Yes Medications obtained and verified?: No (Patient states he was started on Hydroxyzine-given two doses while in the hospital-has not taken any since d/c as he has had no sxs-voices some concerns about taking med after he researched med online and will discuss with MD at appt) Any new allergies since your discharge?: No Dietary orders reviewed?: Yes Type of Diet Ordered:: low salt/heart healthy/carb modified Do you have support at home?: Yes People in  Home: alone, child(ren), adult Name of Support/Comfort Primary Source: sons available to assist as needed  Home Care and Equipment/Supplies: Were Home Health Services Ordered?: NA Any new equipment or medical supplies ordered?: NA  Functional Questionnaire: Do you need assistance with bathing/showering or dressing?: No Do you need assistance with meal preparation?: No Do you need assistance with eating?: No Do you have difficulty maintaining continence: No Do you need assistance with getting out of bed/getting out of a chair/moving?: No Do you have difficulty managing or taking your medications?: No  Follow up appointments reviewed: PCP Follow-up appointment confirmed?: Yes Date of PCP follow-up appointment?: 08/14/22 (pt reproted he was currently at his "second home in [place] for the remainder of the week"-requested appt be scheduled for the following week with provider-care guide assisted with making appt) Follow-up Provider: Dr. Durene Cal Specialist [facility] Follow-up appointment confirmed?: NA Do you need transportation to your follow-up appointment?: No Do you understand care options if your condition(s) worsen?: Yes-patient verbalized understanding  SDOH Interventions Today    Flowsheet Row Most Recent Value  SDOH Interventions   Food Insecurity Interventions Intervention Not Indicated  Transportation Interventions Intervention Not Indicated      TOC Interventions Today    Flowsheet Row Most Recent Value  TOC Interventions   TOC Interventions Discussed/Reviewed TOC Interventions Discussed, Arranged PCP follow up less than 12 days/Care Guide scheduled, S/S of infection      Interventions Today    Flowsheet Row Most Recent Value  General Interventions   General Interventions Discussed/Reviewed General Interventions Discussed, Doctor Visits  Doctor Visits Discussed/Reviewed Doctor Visits Discussed, PCP, Specialist  PCP/Specialist Visits Compliance with follow-up visit   Education Interventions   Education Provided Provided Education  Provided Verbal Education On Nutrition, When to see the doctor, Medication, Other  Mental Health Interventions   Mental Health Discussed/Reviewed Mental Health Discussed, Anxiety  [pt states PCP encouraged pt to follow up with hospice for their greif/counseling support-however pt states he has a "bad taste in his mouth about hospice-given his wife was referred to their services when she was dying"-not interested in other resources]  Pharmacy Interventions   Pharmacy Dicussed/Reviewed Pharmacy Topics Discussed, Medications and their functions  Safety Interventions   Safety Discussed/Reviewed Safety Discussed        Alessandra Grout [facility] Care Management Care Management Community Coordinator Direct Phone: [phone] Toll Free: [phone] Fax: [phone]

## 2022-08-05 NOTE — Telephone Encounter (Signed)
I have reviewed and agree with note, evaluation, plan.  We will discuss his concerns at follow-up  Tana Conch, MD

## 2022-08-09 ENCOUNTER — Encounter: Payer: Self-pay | Admitting: Family Medicine

## 2022-08-09 ENCOUNTER — Ambulatory Visit (INDEPENDENT_AMBULATORY_CARE_PROVIDER_SITE_OTHER): Payer: Medicare Other | Admitting: Family Medicine

## 2022-08-09 VITALS — BP 121/65 | HR 83 | Temp 97.4°F | Ht 72.0 in | Wt 258.4 lb

## 2022-08-09 DIAGNOSIS — Z794 Long term (current) use of insulin: Secondary | ICD-10-CM

## 2022-08-09 DIAGNOSIS — E1151 Type 2 diabetes mellitus with diabetic peripheral angiopathy without gangrene: Secondary | ICD-10-CM | POA: Diagnosis not present

## 2022-08-09 DIAGNOSIS — F411 Generalized anxiety disorder: Secondary | ICD-10-CM

## 2022-08-09 DIAGNOSIS — I2510 Atherosclerotic heart disease of native coronary artery without angina pectoris: Secondary | ICD-10-CM

## 2022-08-09 DIAGNOSIS — L97519 Non-pressure chronic ulcer of other part of right foot with unspecified severity: Secondary | ICD-10-CM

## 2022-08-09 DIAGNOSIS — I10 Essential (primary) hypertension: Secondary | ICD-10-CM

## 2022-08-09 DIAGNOSIS — E11621 Type 2 diabetes mellitus with foot ulcer: Secondary | ICD-10-CM

## 2022-08-09 MED ORDER — CLOPIDOGREL BISULFATE 75 MG PO TABS: 75.0000 mg | ORAL_TABLET | Freq: Every day | ORAL | 3 refills | Status: DC

## 2022-08-09 MED ORDER — ESCITALOPRAM OXALATE 5 MG PO TABS
5.0000 mg | ORAL_TABLET | Freq: Every day | ORAL | 5 refills | Status: DC
Start: 2022-08-09 — End: 2022-12-17

## 2022-08-09 NOTE — Assessment & Plan Note (Signed)
Patient with extensive blister with underlying ulcer over the right second toe covering almost the entire toe-urgent referral to [facility] wound care.  He had a bad experience with podiatry in the past and declines a referral later today though that is where I would typically refer-apparently he developed osteomyelitis in the past under podiatry care and this was traumatic for him

## 2022-08-09 NOTE — Patient Instructions (Addendum)
Urgent referral to wound care center- call us if you don't hear by Tuesday or send [place] a message  Please call [phone] to schedule a visit with [facility] - please tell the office you were directly referred by Dr. Durene Cal - Dr. Monna Fam is in our building  [place] sometimes can be backed up for up to 2 months so here are some other options to check in on: - [place] [facility] [email or web address]  at  [phone] [facility] of life counseling [email or web address] at [phone]  Taking the medicine (escitalopram 5 mg also known as Lexapro)  as directed and not missing any doses is one of the best things you can do to treat your anxiety.  Here are some things to keep in mind:  Side effects (stomach upset, some increased anxiety) may happen before you notice a benefit.  These side effects typically go away over time- usually 2 weeks Changes to your dose of medicine or a change in medication all together is sometimes necessary Most people need to be on medication at least 6-12 months Many people will notice an improvement within two weeks but the full effect of the medication can take up to 6 weeks Stopping the medication when you start feeling better often results in a return of symptoms If you start having thoughts of hurting yourself or others after starting this medicine, call our office immediately at [phone] or seek care through 911 or [phone]   Recommended follow up: Return in about 1 month (around 09/08/2022) for followup or sooner if needed.Schedule b4 you leave. Or up to 6 weeks

## 2022-08-09 NOTE — Progress Notes (Signed)
Phone [phone]   Subjective:  Martin [name]. is a 77 y.o. year old very pleasant male patient who presents for transitional care management and hospital follow up for chest pain. Patient was hospitalized from 08/02/2022 to 08/03/2022. A TCM phone call was completed on 08/05/2022. Medical complexity moderate  Patient presented to emergency room with chest pain shortness of breath (reports had anxiety about his toe and about driving into [place] to visit family).  He had recently seen cardiology 06/20/2022 and underwent stress test 05/29/2022 with no evidence of ischemia and deemed low risk.  Prior chest discomfort was thought not likely related to cardiac issues.  On admission he was hypertensive with BNP to 122. EKG reportedly reassuring.  Labs with negative troponin trend.  Age-adjusted D-dimer within normal limits.  No leukocytosis was noted.  Chest x-ray negative for obvious pneumonia though as some atelectasis was noted.  Patient also had a TTE in the hospital with no new abnormalities.  Due to recent stress test no repeat was pursued.  He was given hydroxyzine as needed with concern that anxiety could be a trigger for his chest pain.  Patient also had secondary issue of right foot tenderness and swelling particular at the second toe.  X-ray of the right foot without evidence for osteomyelitis though there was some soft tissue swelling noted.  He had a blister ultimately discovered on the right foot which was evaluated by wound care who recommend to keep blister intact and refer patient to podiatry outpatient.  During transitional care management call patient reported improvement in chest pain.  He declined social work consult at present.  Patient declined to see podiatry due to a prior poor experience and wanted to see the wound care center instead.  He had some concerns about taking hydroxyzine and his age in particular which is a valid concern for memory/fall issues potentially  today patient  reports  -stress about economy, politics, his finances, his family, managing 2 properties (going to sell [place] home) , grieving loss of wife- when he gets worked up about this he can feel some left chest  mild tension at times - interested in medications and therapy. 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 05/08/2020    Priority: High   History of atrial fibrillation- post op 10/24/2018    Priority: High   Coronary artery disease s/p CABG 10/01/2018 after STEMI     Priority: High   Undiagnosed cardiac murmurs 08/15/2015    Priority: High   Diabetes 12/14/2009    Priority: High   GAD (generalized anxiety disorder) 08/09/2022    Priority: Medium    Drug-induced myopathy 06/18/2022    Priority: Medium    CKD (chronic kidney disease)  stage 3, GFR 30-59 ml/min 08/29/2020    Priority: Medium    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    Priority: Medium    PAD (peripheral artery disease) 08/20/2017    Priority: Medium    Partial tear of right Achilles tendon 02/12/2017    Priority: Medium    Aortic atherosclerosis 10/07/2016    Priority: Medium    History of skin cancer 08/15/2015    Priority: Medium    Diabetic retinopathy 02/21/2015    Priority: Medium    Hyperlipidemia associated with type 2 diabetes mellitus 08/24/2014    Priority: Medium    Diabetic polyneuropathy 04/11/2009     Priority: Medium    Essential hypertension 02/03/2007    Priority: Medium    Acute blood loss as cause of postoperative anemia 10/24/2018    Priority: Low   BPH (benign prostatic hyperplasia) 10/24/2018    Priority: Low   History of depression 10/24/2018    Priority: Low   S/P CABG x 4 10/01/2018    Priority: Low   Onychomycosis of toenail 02/17/2018    Priority: Low   Allergic rhinitis 08/20/2017    Priority: Low   C. difficile colitis 09/25/2016    Priority: Low   History of aspiration pneumonia 09/25/2016    Priority: Low   Former smoker 08/24/2014    Priority: Low   Ingrowing toenail 07/19/2014    Priority: Low   BACK PAIN, LUMBAR, WITH RADICULOPATHY 08/30/2008    Priority: Low   Diabetic ulcer of toe of right foot associated with type 2 diabetes mellitus 08/22/2021   Simple chronic bronchitis 05/08/2020    Medications- reviewed and updated  A medical reconciliation was performed comparing current medicines to hospital discharge medications. Current Outpatient Medications  Medication Sig Dispense Refill   aspirin 81 MG EC tablet Take 1 tablet (81 mg total) by mouth daily.     azelastine (ASTELIN) 0.1 % nasal spray Place 1 spray into both nostrils 2 (two) times daily. 30 mL 12   Continuous Blood Gluc Receiver (FREESTYLE LIBRE 2 READER) DEVI See admin instructions.     Continuous Blood Gluc Sensor (FREESTYLE LIBRE 2 SENSOR) MISC 1 Device by Does not apply route every 14 (fourteen) days. 6 each 3   escitalopram (LEXAPRO) 5 MG tablet Take 1 tablet (5 mg total) by mouth daily. 30 tablet 5   ezetimibe (ZETIA) 10 MG tablet Take 1 tablet (10 mg total) by mouth daily. 90 tablet 3   fluticasone (FLONASE) 50 MCG/ACT nasal spray Place 2 sprays into both nostrils daily as needed for allergies or rhinitis.     Fluticasone-Umeclidin-Vilant (TRELEGY ELLIPTA) 100-62.5-25 MCG/ACT AEPB Inhale 1 puff into the lungs daily. 60 each 5   hydrOXYzine (ATARAX) 25 MG tablet Take 25 mg by mouth  every 6 (six) hours as needed for anxiety.     insulin aspart (NOVOLOG FLEXPEN) 100 UNIT/ML FlexPen Inject 20-25 Units into the skin 3 (three) times daily with meals. 3 times a day (just before each meal), 30-35-35 units, and pen needles 3/day 75 mL 3   insulin glargine (LANTUS SOLOSTAR) 100 UNIT/ML Solostar Pen Inject 35 Units into the skin at bedtime. 30 mL 3   insulin lispro (HUMALOG KWIKPEN) 200 UNIT/ML KwikPen Inject 15-25 Units into the skin 3 (three) times daily before meals. 12 mL 3   Insulin Pen Needle (BD PEN NEEDLE NANO 2ND GEN) 32G X 4 MM MISC USE AS INSTRUCTED TO ADMINISTER INSULIN 5X DAILY 500 each 1   isosorbide mononitrate (  IMDUR) 30 MG 24 hr tablet Take 1 tablet (30 mg total) by mouth daily. 90 tablet 3   losartan (COZAAR) 50 MG tablet TAKE 1 TABLET (50 MG) BY MOUTH IN THE MORNING AND AT BEDTIME 180 tablet 3   montelukast (SINGULAIR) 10 MG tablet Take 1 tablet (10 mg total) by mouth at bedtime. 30 tablet 11   nitroGLYCERIN (NITROSTAT) 0.4 MG SL tablet Place 1 tablet (0.4 mg total) under the tongue every 5 (five) minutes as needed for chest pain (if pain does not resolve with 2nd dose- seek care). 20 tablet 5   ONETOUCH VERIO test strip CHECK LEVELS 4 TIMES DAILY 100 strip 2   Pitavastatin Calcium (LIVALO) 1 MG TABS Take 1 tablet (1 mg total) by mouth daily. 90 tablet 3   torsemide (DEMADEX) 20 MG tablet MUST MAKE APPOINTMENT FOR FUTURE REFILL LAST ATTEMPT. TAKE 2 TABLETS BY MOUTH EVERY DAY (Patient taking differently: Take 40 mg by mouth daily as needed (For fluid).) 30 tablet 0   clopidogrel (PLAVIX) 75 MG tablet Take 1 tablet (75 mg total) by mouth daily. 90 tablet 3   ondansetron (ZOFRAN-ODT) 4 MG disintegrating tablet Take 1 tablet (4 mg total) by mouth every 8 (eight) hours as needed for up to 20 doses for nausea or vomiting. (Patient not taking: Reported on 08/09/2022) 20 tablet 0   No current facility-administered medications for this visit.   Objective  Objective:  BP  121/65 Comment: home reading this morning  Pulse 83   Temp (!) 97.4 F (36.3 C)   Ht 6' (1.829 m)   Wt 258 lb 6.4 oz (117.2 kg)   SpO2 97%   BMI 35.05 kg/m  Gen: NAD, resting comfortably CV: RRR no murmurs rubs or gallops Lungs: CTAB no crackles, wheeze, rhonchi Abdomen: soft/nontender/nondistended/normal bowel sounds. No rebound or guarding. obese Ext: trace edema Skin: warm, dry Physical Exam Musculoskeletal:       Feet:  Feet:     Comments: Extensive blister covering entirety of right 2nd toe     Assessment and Plan:   #Hospital follow up for chest pain and blister on right foot    Coronary artery disease s/p CABG 10/01/2018 after STEMI Patient was admitted with chest pain but he reports this started due to ulcer on his toe as well as concerns about driving in [place] traffic-reassuring EKG, troponin trend, chest x-ray, echocardiogram.  I recently had stress testing so this was not repeated - See discussion about anxiety/stress management as I believe treating anxiety is the primary need at this time-but still continue regular cardiology follow-up-continue aspirin, Plavix 75 mg, Zetia 10 mg, Livalo 1 mg-in the hospital cholesterol is reasonably well-controlled with LDL at 68-I printed a copy of these so I can communicate with lipid clinic but this should be available on Care Everywhere  GAD (generalized anxiety disorder) Patient with significant anxiety and GAD-7 at 12-feels anxiety is the main driver for his chest discomfort.  He has not tried hydroxyzine-he is concerned about risks at his age.  We also discussed options like benzodiazepines (preference) or buspirone versus daily medication like SSRIs-he would like to trial Lexapro 5 mg after discussion and he will follow-up in about 6 weeks-I suspect substantial improvement but we did discuss potential side effects and risks.  Thankfully no SI -he is also open to seeing a therapist which I think would be excellent-he feels he  needs help managing stress related to accounting, politics, his finances, his family dynamics  Diabetic ulcer  of toe of right foot associated with type 2 diabetes mellitus Patient with extensive blister with underlying ulcer over the right second toe covering almost the entire toe-urgent referral to [facility] wound care.  He had a bad experience with podiatry in the past and declines a referral later today though that is where I would typically refer-apparently he developed osteomyelitis in the past under podiatry care and this was traumatic for him  [facility]) Under the excellent care of [facility]-he reports sugars are trending down on long-acting insulin-encouraged him to continue both Lantus and aspart and endocrinology follow-up  Essential hypertension Well-controlled on Imdur 30 mg, losartan 50 mg twice daily, torsemide 20 to 40 mg daily as needed-continue current medication  Recommended follow up: Return in about 1 month (around 09/08/2022) for followup or sooner if needed.Schedule b4 you leave. Future Appointments  Date Time Provider Department Center  09/10/2022 11:20 AM Shelva Majestic, MD [facility]  12/16/2022  8:20 AM Carlus Pavlov, MD [facility] None  12/25/2022  8:00 AM Shelva Majestic, MD [facility]  01/17/2023  8:00 AM Lennette Bihari, MD [facility] None    Lab/Order associations:   ICD-10-CM   1. GAD (generalized anxiety disorder)  F41.1 Ambulatory referral to Psychology    2. Diabetic ulcer of toe of right foot associated with type 2 diabetes mellitus, unspecified ulcer stage  E11.621 AMB referral to wound care center   L97.519     3. Coronary artery disease involving native coronary artery of native heart without angina pectoris  I25.10     4. Type 2 diabetes mellitus with diabetic peripheral angiopathy without gangrene, with long-term current use of insulin  E11.51    Z79.4     5. Essential hypertension  I10       Meds ordered this  encounter  Medications   escitalopram (LEXAPRO) 5 MG tablet    Sig: Take 1 tablet (5 mg total) by mouth daily.    Dispense:  30 tablet    Refill:  5   clopidogrel (PLAVIX) 75 MG tablet    Sig: Take 1 tablet (75 mg total) by mouth daily.    Dispense:  90 tablet    Refill:  3   Time Spent: 42 minutes of total time (11:18 AM- 12 PM) was spent on the date of the encounter performing the following actions: chart review prior to seeing the patient including care everywhere notes, obtaining history, performing a medically necessary exam, counseling on the treatment plan and needed workup, placing orders, and documenting in our EHR.    Return precautions advised.  Tana Conch, MD

## 2022-08-09 NOTE — Assessment & Plan Note (Signed)
Under the excellent care of [facility]-he reports sugars are trending down on long-acting insulin-encouraged him to continue both Lantus and aspart and endocrinology follow-up

## 2022-08-09 NOTE — Assessment & Plan Note (Signed)
Well-controlled on Imdur 30 mg, losartan 50 mg twice daily, torsemide 20 to 40 mg daily as needed-continue current medication

## 2022-08-09 NOTE — Assessment & Plan Note (Addendum)
Patient was admitted with chest pain but he reports this started due to ulcer on his toe as well as concerns about driving in [place] traffic-reassuring EKG, troponin trend, chest x-ray, echocardiogram.  I recently had stress testing so this was not repeated - See discussion about anxiety/stress management as I believe treating anxiety is the primary need at this time-but still continue regular cardiology follow-up-continue aspirin, Plavix 75 mg, Zetia 10 mg, Livalo 1 mg-in the hospital cholesterol is reasonably well-controlled with LDL at 68-I printed a copy of these so I can communicate with lipid clinic but this should be available on Care Everywhere

## 2022-08-09 NOTE — Assessment & Plan Note (Addendum)
Patient with significant anxiety and GAD-7 at 11-feels anxiety is the main driver for his chest discomfort.  He has not tried hydroxyzine-he is concerned about risks at his age.  We also discussed options like benzodiazepines (preference) or buspirone versus daily medication like SSRIs-he would like to trial Lexapro 5 mg after discussion and he will follow-up in about 6 weeks-I suspect substantial improvement but we did discuss potential side effects and risks.  Thankfully no SI -She is also open to seeing a therapist which I think would be excellent-he feels he needs help managing stress related to accounting, politics, his finances, his family dynamics

## 2022-08-12 ENCOUNTER — Telehealth: Payer: Self-pay | Admitting: Pharmacist Clinician (PhC)/ Clinical Pharmacy Specialist

## 2022-08-12 ENCOUNTER — Telehealth: Payer: Self-pay

## 2022-08-12 ENCOUNTER — Other Ambulatory Visit (HOSPITAL_COMMUNITY): Payer: Self-pay

## 2022-08-12 NOTE — Telephone Encounter (Signed)
Pharmacy Patient Advocate Encounter   Received notification from [facility] MEDICARE that prior authorization for REPATHA is needed.    PA submitted on 08/12/22 Key [id number] Status is pending  Haze Rushing, CPhT Pharmacy Patient Advocate Specialist Direct Number: [phone] Fax: [phone]

## 2022-08-12 NOTE — Telephone Encounter (Signed)
New lipid labs in Epic  Please do PA for Repatha 140 mg dose.  His LDL goal is < 55.  Let me know if they deny and I can write a letter

## 2022-08-12 NOTE — Telephone Encounter (Signed)
Pharmacy Patient Advocate Encounter  Prior Authorization for REPATHA has been approved.    Effective dates: 08/12/22 through 08/12/23  Haze Rushing, CPhT Pharmacy Patient Advocate Specialist Direct Number: [phone] Fax: [phone]

## 2022-08-13 DIAGNOSIS — S90424A Blister (nonthermal), right lesser toe(s), initial encounter: Secondary | ICD-10-CM | POA: Diagnosis not present

## 2022-08-13 DIAGNOSIS — E119 Type 2 diabetes mellitus without complications: Secondary | ICD-10-CM | POA: Diagnosis not present

## 2022-08-13 DIAGNOSIS — I739 Peripheral vascular disease, unspecified: Secondary | ICD-10-CM | POA: Diagnosis not present

## 2022-08-13 DIAGNOSIS — N183 Chronic kidney disease, stage 3 unspecified: Secondary | ICD-10-CM | POA: Diagnosis not present

## 2022-08-13 DIAGNOSIS — Z794 Long term (current) use of insulin: Secondary | ICD-10-CM | POA: Diagnosis not present

## 2022-08-13 DIAGNOSIS — I2510 Atherosclerotic heart disease of native coronary artery without angina pectoris: Secondary | ICD-10-CM | POA: Diagnosis not present

## 2022-08-13 NOTE — Telephone Encounter (Signed)
Repatha PA approved to 08/12/23.   [employer] grant

## 2022-08-14 ENCOUNTER — Inpatient Hospital Stay: Payer: Medicare Other | Admitting: Family Medicine

## 2022-08-21 MED ORDER — REPATHA SURECLICK 140 MG/ML ~~LOC~~ SOAJ: 140.0000 mg | SUBCUTANEOUS | 3 refills | Status: DC

## 2022-08-21 NOTE — Telephone Encounter (Signed)
[employer] Grant:  ID  [id number] BIN [id number] PCN  [id number] GRP [id number]  Exp 07/21/23

## 2022-08-22 DIAGNOSIS — I2510 Atherosclerotic heart disease of native coronary artery without angina pectoris: Secondary | ICD-10-CM | POA: Diagnosis not present

## 2022-08-22 DIAGNOSIS — Z794 Long term (current) use of insulin: Secondary | ICD-10-CM | POA: Diagnosis not present

## 2022-08-22 DIAGNOSIS — Z1330 Encounter for screening examination for mental health and behavioral disorders, unspecified: Secondary | ICD-10-CM | POA: Diagnosis not present

## 2022-08-22 DIAGNOSIS — S90424A Blister (nonthermal), right lesser toe(s), initial encounter: Secondary | ICD-10-CM | POA: Diagnosis not present

## 2022-08-22 DIAGNOSIS — N183 Chronic kidney disease, stage 3 unspecified: Secondary | ICD-10-CM | POA: Diagnosis not present

## 2022-08-22 DIAGNOSIS — I739 Peripheral vascular disease, unspecified: Secondary | ICD-10-CM | POA: Diagnosis not present

## 2022-08-22 DIAGNOSIS — E119 Type 2 diabetes mellitus without complications: Secondary | ICD-10-CM | POA: Diagnosis not present

## 2022-09-03 DIAGNOSIS — H35013 Changes in retinal vascular appearance, bilateral: Secondary | ICD-10-CM | POA: Diagnosis not present

## 2022-09-03 DIAGNOSIS — D3131 Benign neoplasm of right choroid: Secondary | ICD-10-CM | POA: Diagnosis not present

## 2022-09-03 DIAGNOSIS — H40023 Open angle with borderline findings, high risk, bilateral: Secondary | ICD-10-CM | POA: Diagnosis not present

## 2022-09-03 DIAGNOSIS — E113213 Type 2 diabetes mellitus with mild nonproliferative diabetic retinopathy with macular edema, bilateral: Secondary | ICD-10-CM | POA: Diagnosis not present

## 2022-09-10 ENCOUNTER — Ambulatory Visit: Payer: Medicare Other | Admitting: Family Medicine

## 2022-10-01 ENCOUNTER — Encounter: Payer: Self-pay | Admitting: Family Medicine

## 2022-10-01 ENCOUNTER — Ambulatory Visit (INDEPENDENT_AMBULATORY_CARE_PROVIDER_SITE_OTHER): Payer: Medicare Other | Admitting: Family Medicine

## 2022-10-01 VITALS — BP 132/68 | HR 89 | Temp 97.7°F | Ht 72.0 in | Wt 267.8 lb

## 2022-10-01 DIAGNOSIS — I10 Essential (primary) hypertension: Secondary | ICD-10-CM

## 2022-10-01 DIAGNOSIS — F411 Generalized anxiety disorder: Secondary | ICD-10-CM

## 2022-10-01 NOTE — Progress Notes (Signed)
Phone [phone] In person visit   Subjective:   Martin [name]. is a 77 y.o. 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 (HCC) 05/08/2020    Priority: High   History of atrial fibrillation- post op 10/24/2018    Priority: High   Coronary artery disease s/p CABG 10/01/2018 after STEMI     Priority: High   Undiagnosed cardiac murmurs 08/15/2015    Priority: High   Diabetes (HCC) 12/14/2009    Priority: High   GAD (generalized anxiety disorder) 08/09/2022    Priority: Medium    Drug-induced myopathy 06/18/2022    Priority: Medium    CKD (chronic kidney disease) stage 3, GFR 30-59 ml/min (HCC) 08/29/2020    Priority: Medium    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    Priority: Medium    PAD (peripheral artery disease) (HCC) 08/20/2017    Priority: Medium    Partial tear of right Achilles tendon 02/12/2017    Priority: Medium    Aortic atherosclerosis (HCC) 10/07/2016    Priority: Medium    History of skin cancer 08/15/2015    Priority: Medium    Diabetic retinopathy (HCC) 02/21/2015    Priority: Medium    Hyperlipidemia associated with type 2 diabetes mellitus (HCC) 08/24/2014    Priority: Medium    Diabetic polyneuropathy (HCC) 04/11/2009    Priority: Medium    Essential hypertension 02/03/2007    Priority: Medium    Acute blood loss as cause of postoperative anemia 10/24/2018    Priority: Low   BPH (benign prostatic hyperplasia) 10/24/2018    Priority: Low   History of depression 10/24/2018    Priority: Low   S/P CABG x 4 10/01/2018    Priority: Low   Onychomycosis of toenail 02/17/2018    Priority: Low   Allergic rhinitis 08/20/2017    Priority: Low   C. difficile colitis 09/25/2016    Priority: Low   History  of aspiration pneumonia 09/25/2016    Priority: Low   Former smoker 08/24/2014    Priority: Low   Ingrowing toenail 07/19/2014    Priority: Low   BACK PAIN, LUMBAR, WITH RADICULOPATHY 08/30/2008    Priority: Low   Diabetic ulcer of toe of right foot associated with type 2 diabetes mellitus (HCC) 08/22/2021   Simple chronic bronchitis (HCC) 05/08/2020    Medications- reviewed and updated Current Outpatient Medications  Medication Sig Dispense Refill   aspirin 81 MG EC tablet Take 1 tablet (81 mg total) by mouth daily.     azelastine (ASTELIN) 0.1 % nasal spray Place 1 spray into both nostrils 2 (two) times daily. 30 mL 12   clopidogrel (PLAVIX) 75 MG tablet Take 1 tablet (75 mg total) by mouth daily. 90 tablet 3   Continuous Blood Gluc Receiver (FREESTYLE LIBRE 2 READER) DEVI See admin instructions.     Continuous Blood Gluc Sensor (FREESTYLE LIBRE 2 SENSOR) MISC 1 Device by Does not apply route every 14 (fourteen) days. 6 each 3   escitalopram (LEXAPRO) 5 MG tablet Take 1 tablet (5 mg total) by mouth daily. 30 tablet 5   Evolocumab (REPATHA SURECLICK) 140 MG/ML SOAJ Inject 140 mg into the skin every 14 (fourteen) days. 6  mL 3   ezetimibe (ZETIA) 10 MG tablet Take 1 tablet (10 mg total) by mouth daily. 90 tablet 3   fluticasone (FLONASE) 50 MCG/ACT nasal spray Place 2 sprays into both nostrils daily as needed for allergies or rhinitis.     Fluticasone-Umeclidin-Vilant (TRELEGY ELLIPTA) 100-62.5-25 MCG/ACT AEPB Inhale 1 puff into the lungs daily. 60 each 5   hydrOXYzine (ATARAX) 25 MG tablet Take 25 mg by mouth every 6 (six) hours as needed for anxiety.     insulin aspart (NOVOLOG FLEXPEN) 100 UNIT/ML FlexPen Inject 20-25 Units into the skin 3 (three) times daily with meals. 3 times a day (just before each meal), 30-35-35 units, and pen needles 3/day 75 mL 3   insulin glargine (LANTUS SOLOSTAR) 100 UNIT/ML Solostar Pen Inject 35 Units into the skin at bedtime. 30 mL 3   insulin lispro  (HUMALOG KWIKPEN) 200 UNIT/ML KwikPen Inject 15-25 Units into the skin 3 (three) times daily before meals. 12 mL 3   Insulin Pen Needle (BD PEN NEEDLE NANO 2ND GEN) 32G X 4 MM MISC USE AS INSTRUCTED TO ADMINISTER INSULIN 5X DAILY 500 each 1   isosorbide mononitrate (IMDUR) 30 MG 24 hr tablet Take 1 tablet (30 mg total) by mouth daily. 90 tablet 3   losartan (COZAAR) 50 MG tablet TAKE 1 TABLET (50 MG) BY MOUTH IN THE MORNING AND AT BEDTIME 180 tablet 3   montelukast (SINGULAIR) 10 MG tablet Take 1 tablet (10 mg total) by mouth at bedtime. 30 tablet 11   nitroGLYCERIN (NITROSTAT) 0.4 MG SL tablet Place 1 tablet (0.4 mg total) under the tongue every 5 (five) minutes as needed for chest pain (if pain does not resolve with 2nd dose- seek care). 20 tablet 5   ondansetron (ZOFRAN-ODT) 4 MG disintegrating tablet Take 1 tablet (4 mg total) by mouth every 8 (eight) hours as needed for up to 20 doses for nausea or vomiting. 20 tablet 0   ONETOUCH VERIO test strip CHECK LEVELS 4 TIMES DAILY 100 strip 2   Pitavastatin Calcium (LIVALO) 1 MG TABS Take 1 tablet (1 mg total) by mouth daily. 90 tablet 3   torsemide (DEMADEX) 20 MG tablet MUST MAKE APPOINTMENT FOR FUTURE REFILL LAST ATTEMPT. TAKE 2 TABLETS BY MOUTH EVERY DAY (Patient taking differently: Take 40 mg by mouth daily as needed (For fluid).) 30 tablet 0   No current facility-administered medications for this visit.     Objective:  BP 132/68   Pulse 89   Temp 97.7 F (36.5 C)   Ht 6' (1.829 m)   Wt 267 lb 12.8 oz (121.5 kg)   SpO2 94%   BMI 36.32 kg/m  Gen: NAD, resting comfortably CV: RRR  Lungs: CTAB no crackles, wheeze, rhonchi Ext: no edema Skin: warm, dry     Assessment and Plan   # Anxiety S:Medication: Escitalopram 5 mg started last visit- he feels essentially back at his baseline which is a substantial improvement from where he was Counseling: Refer to therapist last visit- was unable to reach them - still grieving as well  obviously but able to talk about his wife's death much easier now. Still certain triggers    10/01/2022    4:26 PM 08/09/2022   10:52 AM 06/18/2022    8:58 AM 06/18/2022    8:57 AM  GAD 7 : Generalized Anxiety Score  Nervous, Anxious, on Edge 1 1 2 2   Control/stop worrying 2 3 2 2   Worry too much - different things 2  3 2 2   Trouble relaxing 1 2 0 0  Restless 0 1 0 0  Easily annoyed or irritable 0 1 0 0  Afraid - awful might happen 1 1 2 2   Total GAD 7 Score 7 12 8 8   Anxiety Difficulty Somewhat difficult Very difficult Somewhat difficult Somewhat difficult  A/P: doing reasonably well with escitalopram 5 mg- gave option of increasing dose- hed prefer to remain on current dose with improvement -wants to hold off on therapy at this point- was not easy to access   #Wound care clinic with [facility] in [place]- only 2 visits to heal blister of 2nd toe.   #hypertension S: medication: Losartan 50 mg twice daily, imdur 30 mg, torsemide 40 mg Home readings #s: 135/60's on home readings- 62 lowest last week A/P: stable- continue current medicines   Recommended follow up: Return for next already scheduled visit or sooner if needed. Future Appointments  Date Time Provider Department Center  12/16/2022  8:20 AM Carlus Pavlov, MD [facility] None  01/17/2023  8:00 AM Lennette Bihari, MD [facility] None  01/22/2023  9:20 AM Shelva Majestic, MD [facility]    Lab/Order associations:   ICD-10-CM   1. GAD (generalized anxiety disorder)  F41.1     2. Essential hypertension  I10      No orders of the defined types were placed in this encounter.  Return precautions advised.  Tana Conch, MD

## 2022-10-01 NOTE — Patient Instructions (Addendum)
Let us know if you get your [facility] at the pharmacy.  Glad you are doing so much better! Continue the escitalopram 5 mg- let me know if things worsen- but for now continue current dose  Recommended follow up: Return for next already scheduled visit or sooner if needed. Or can push to November or so

## 2022-10-22 DIAGNOSIS — H40023 Open angle with borderline findings, high risk, bilateral: Secondary | ICD-10-CM | POA: Diagnosis not present

## 2022-10-22 DIAGNOSIS — H35372 Puckering of macula, left eye: Secondary | ICD-10-CM | POA: Diagnosis not present

## 2022-10-22 DIAGNOSIS — E113213 Type 2 diabetes mellitus with mild nonproliferative diabetic retinopathy with macular edema, bilateral: Secondary | ICD-10-CM | POA: Diagnosis not present

## 2022-10-22 DIAGNOSIS — Z961 Presence of intraocular lens: Secondary | ICD-10-CM | POA: Diagnosis not present

## 2022-11-27 ENCOUNTER — Other Ambulatory Visit: Payer: Self-pay | Admitting: Internal Medicine

## 2022-12-12 ENCOUNTER — Encounter (INDEPENDENT_AMBULATORY_CARE_PROVIDER_SITE_OTHER): Payer: Self-pay

## 2022-12-14 ENCOUNTER — Other Ambulatory Visit: Payer: Self-pay | Admitting: Family Medicine

## 2022-12-16 ENCOUNTER — Encounter: Payer: Self-pay | Admitting: Internal Medicine

## 2022-12-16 ENCOUNTER — Ambulatory Visit (INDEPENDENT_AMBULATORY_CARE_PROVIDER_SITE_OTHER): Payer: Medicare Other | Admitting: Internal Medicine

## 2022-12-16 VITALS — BP 132/78 | HR 86 | Ht 72.0 in | Wt 271.0 lb

## 2022-12-16 DIAGNOSIS — E785 Hyperlipidemia, unspecified: Secondary | ICD-10-CM | POA: Diagnosis not present

## 2022-12-16 DIAGNOSIS — E1169 Type 2 diabetes mellitus with other specified complication: Secondary | ICD-10-CM

## 2022-12-16 DIAGNOSIS — E119 Type 2 diabetes mellitus without complications: Secondary | ICD-10-CM

## 2022-12-16 DIAGNOSIS — Z794 Long term (current) use of insulin: Secondary | ICD-10-CM | POA: Diagnosis not present

## 2022-12-16 DIAGNOSIS — E1151 Type 2 diabetes mellitus with diabetic peripheral angiopathy without gangrene: Secondary | ICD-10-CM

## 2022-12-16 LAB — POCT GLYCOSYLATED HEMOGLOBIN (HGB A1C)

## 2022-12-16 NOTE — Progress Notes (Signed)
Patient ID: Martin Ponto Sr., male   DOB: March 12, 1946, 77 y.o.   MRN: [id number]  HPI: Martin ROCCAFORTE Sr. is a 77 y.o.-year-old male, returning for follow-up for DM2, dx in 2004, insulin-dependent since 2013, uncontrolled, with complications (CAD - s/p AMI, s/p CABG 2020, PAD - s/p angioplasty, A fib, CKD, DR, PN). Pt. previously saw Dr. Everardo All, but last visit with me 4 months ago.  Interim history: His wife died last year (03-22-22).  He was grieving and under a lot of stress.  PCP started him on escitalopram and he feels much better. Today he has some anxiety, but overall feels a little better. No increased urination, chest pain.  He has some blurry vision, which is not new.  He has glaucoma and had a recent eye exam. He mentions he has more carbs and snacks. He has some nausea - feels he ate a spoiled sandwich.  Reviewed HbA1c: Lab Results  Component Value Date   HGBA1C 7.9 (A) 07/22/2022   HGBA1C 8.9 (A) 03/18/2022   HGBA1C 8.3 (A) 08/20/2021   HGBA1C 8.1 (A) 06/04/2021   HGBA1C 9.1 (A) 12/18/2020   HGBA1C 8.1 (H) 08/16/2020   HGBA1C 8.6 (H) 02/09/2020   HGBA1C 7.6 (H) 07/08/2019   HGBA1C 8.2 (H) 01/06/2019   HGBA1C 8.1 (H) 09/29/2018   At last visit he was on: - Novolog 30 units 3x a day with meals, including in the middle of the night He was on Lantus before, sugars were better controlled then.  I advised him to change to: - >> AP/nausea/vomiting >> stopped -he had to go to the hospital by ambulance in the middle of the night in 03/2022.  He would not want to restart this. - Lantus 20 >> 25 >> 30 units at bedtime - Novolog 20-25 >> 15-20 units 15 min before the 3 meals  Pt checks his sugars >4x a day and they are:  Previously:  Previously:  Lowest sugar was >100 >> 70 >> 100; he has hypoglycemia awareness at 100.  Highest sugar was 200s >> 300s >> 200s  Glucometer: One Touch Verio  - + CKD, last BUN/creatinine:  Lab Results  Component Value Date   BUN 28 (H)  06/19/2022   BUN 28 (H) 04/03/2022   CREATININE 1.36 06/19/2022   CREATININE 1.53 (H) 04/03/2022  On Cozaar 50 mg daily.  - + HL; last set of lipids: Lab Results  Component Value Date   CHOL 156 09/25/2021   HDL 37.80 (L) 09/25/2021   LDLCALC 94 09/25/2021   LDLDIRECT 173.0 07/01/2018   TRIG 120.0 09/25/2021   CHOLHDL 4 09/25/2021  On Livalo 1 mg daily, Zetia 10 mg daily. He is trying to start Repatha - 165$ per mo now in the doughnut hole.  - last eye exam was on 11/27/2022.  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  He previously had no DR. He has IO inj.  - + numbness and tingling in his feet.  Last foot exam was on 07/22/2022. He has history of osteomyelitis of his left toe, but this improved after his angioplasty procedure.  ROS: + see HPI  Past Medical History:  Diagnosis Date   A-fib (HCC)    CKD (chronic kidney disease) stage 3, GFR 30-59 ml/min (HCC) 08/29/2020   Diabetes mellitus    Heart attack (HCC) 10/01/2018   Pt had open heart surgery   Hyperkalemia 08/29/2020   Hypertension    Osteomyelitis of fifth toe of left foot (HCC)  05/08/2020   Simple chronic bronchitis (HCC) 05/08/2020   Tobacco abuse    Past Surgical History:  Procedure Laterality Date   CORONARY ARTERY BYPASS GRAFT N/A 10/01/2018   Procedure: CORONARY ARTERY BYPASS GRAFTING (CABG) TIMES  FOUR USING LEFT MAMMARY ARTERY AND RIGHT GREATER SAPHEANOUS VEIN HARVESTED ENDOSCOPICALLY;  Surgeon: Loreli Slot, MD;  Location: [facility] OR;  Service: Open Heart Surgery;  Laterality: N/A;   HERNIA REPAIR     >10 years ago   IR ANGIOGRAM EXTREMITY LEFT  06/16/2020   IR RADIOLOGIST EVAL & MGMT  12/24/2016   IR RADIOLOGIST EVAL & MGMT  05/30/2020   IR RADIOLOGIST EVAL & MGMT  07/13/2020   IR RADIOLOGIST EVAL & MGMT  09/06/2020   IR RADIOLOGIST EVAL & MGMT  12/26/2020   IR THORACENTESIS ASP PLEURAL SPACE W/IMG GUIDE  10/06/2018   IR US GUIDE VASC ACCESS RIGHT  06/16/2020   LEFT HEART CATH AND CORONARY ANGIOGRAPHY N/A  09/29/2018   Procedure: LEFT HEART CATH AND CORONARY ANGIOGRAPHY;  Surgeon: Lennette Bihari, MD;  Location: [facility];  Service: Cardiovascular;  Laterality: N/A;   open heart surfery     right shoulder surgery     around 2014   TEE WITHOUT CARDIOVERSION N/A 10/01/2018   Procedure: TRANSESOPHAGEAL ECHOCARDIOGRAM (TEE);  Surgeon: Loreli Slot, MD;  Location: [facility] OR;  Service: Open Heart Surgery;  Laterality: N/A;   Social History   Socioeconomic History   Marital status: Married    Spouse name: Not on file   Number of children: Not on file   Years of education: Not on file   Highest education level: Not on file  Occupational History   Not on file  Tobacco Use   Smoking status: Former    Current packs/day: 0.00    Average packs/day: 0.8 packs/day for 2.0 years (1.5 ttl pk-yrs)    Types: Cigarettes    Start date: 2002    Quit date: 04/29/2002    Years since quitting: 20.6   Smokeless tobacco: Never   Tobacco comments:    smoked for 10 years on and off  Substance and Sexual Activity   Alcohol use: Yes    Comment: occasionally   Drug use: No   Sexual activity: Yes  Other Topics Concern   Not on file  Social History Narrative   Married. 3 kids. 7 grandkids. No greatgrandkids.       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      Hobbies: golf previously, yardwork   Social Determinants of Health   Financial Resource Strain: Not on file  Food Insecurity: No Food Insecurity (08/05/2022)   Hunger Vital Sign    Worried About Running Out of Food in the Last Year: Never true    Ran Out of Food in the Last Year: Never true  Transportation Needs: No Transportation Needs (08/05/2022)   PRAPARE - Administrator, Civil Service (Medical): No    Lack of Transportation (Non-Medical): No  Physical Activity: Not on file  Stress: Not on file  Social Connections: Unknown (08/09/2022)   [facility], [facility]   Social  Network    Social Network: Not on file  Intimate Partner Violence: Unknown (08/09/2022)   Received from [facility], [facility]   HITS    Physically Hurt: Not on file    Insult or Talk Down To: Not on file    Threaten Physical Harm: Not on  file    Scream or Curse: Not on file   Current Outpatient Medications on File Prior to Visit  Medication Sig Dispense Refill   aspirin 81 MG EC tablet Take 1 tablet (81 mg total) by mouth daily.     azelastine (ASTELIN) 0.1 % nasal spray Place 1 spray into both nostrils 2 (two) times daily. 30 mL 12   clopidogrel (PLAVIX) 75 MG tablet Take 1 tablet (75 mg total) by mouth daily. 90 tablet 3   Continuous Blood Gluc Receiver (FREESTYLE LIBRE 2 READER) DEVI See admin instructions.     Continuous Blood Gluc Sensor (FREESTYLE LIBRE 2 SENSOR) MISC 1 Device by Does not apply route every 14 (fourteen) days. 6 each 3   escitalopram (LEXAPRO) 5 MG tablet Take 1 tablet (5 mg total) by mouth daily. 30 tablet 5   Evolocumab (REPATHA SURECLICK) 140 MG/ML SOAJ Inject 140 mg into the skin every 14 (fourteen) days. 6 mL 3   ezetimibe (ZETIA) 10 MG tablet Take 1 tablet (10 mg total) by mouth daily. 90 tablet 3   fluticasone (FLONASE) 50 MCG/ACT nasal spray Place 2 sprays into both nostrils daily as needed for allergies or rhinitis.     Fluticasone-Umeclidin-Vilant (TRELEGY ELLIPTA) 100-62.5-25 MCG/ACT AEPB Inhale 1 puff into the lungs daily. 60 each 5   HUMALOG KWIKPEN 200 UNIT/ML KwikPen ADMINISTER 15 TO 25 UNITS UNDER THE SKIN THREE TIMES DAILY BEFORE MEALS 12 mL 3   hydrOXYzine (ATARAX) 25 MG tablet Take 25 mg by mouth every 6 (six) hours as needed for anxiety.     insulin aspart (NOVOLOG FLEXPEN) 100 UNIT/ML FlexPen Inject 20-25 Units into the skin 3 (three) times daily with meals. 3 times a day (just before each meal), 30-35-35 units, and pen needles 3/day 75 mL 3   insulin glargine (LANTUS SOLOSTAR) 100 UNIT/ML Solostar Pen Inject 35 Units into the skin at bedtime.  30 mL 3   Insulin Pen Needle (BD PEN NEEDLE NANO 2ND GEN) 32G X 4 MM MISC USE AS INSTRUCTED TO ADMINISTER INSULIN 5X DAILY 500 each 1   isosorbide mononitrate (IMDUR) 30 MG 24 hr tablet Take 1 tablet (30 mg total) by mouth daily. 90 tablet 3   losartan (COZAAR) 50 MG tablet TAKE 1 TABLET (50 MG) BY MOUTH IN THE MORNING AND AT BEDTIME 180 tablet 3   montelukast (SINGULAIR) 10 MG tablet Take 1 tablet (10 mg total) by mouth at bedtime. 30 tablet 11   nitroGLYCERIN (NITROSTAT) 0.4 MG SL tablet Place 1 tablet (0.4 mg total) under the tongue every 5 (five) minutes as needed for chest pain (if pain does not resolve with 2nd dose- seek care). 20 tablet 5   ondansetron (ZOFRAN-ODT) 4 MG disintegrating tablet Take 1 tablet (4 mg total) by mouth every 8 (eight) hours as needed for up to 20 doses for nausea or vomiting. 20 tablet 0   ONETOUCH VERIO test strip CHECK LEVELS 4 TIMES DAILY 100 strip 2   Pitavastatin Calcium (LIVALO) 1 MG TABS Take 1 tablet (1 mg total) by mouth daily. 90 tablet 3   torsemide (DEMADEX) 20 MG tablet MUST MAKE APPOINTMENT FOR FUTURE REFILL LAST ATTEMPT. TAKE 2 TABLETS BY MOUTH EVERY DAY (Patient taking differently: Take 40 mg by mouth daily as needed (For fluid).) 30 tablet 0   [DISCONTINUED] DAPTOmycin (CUBICIN) 500 MG injection Inject into the vein. (Patient not taking: Reported on 10/22/2020)     [DISCONTINUED] DAPTOmycin 350 MG SOLR Inject into the vein. (Patient not taking:  No sig reported)     No current facility-administered medications on file prior to visit.   Allergies  Allergen Reactions   Jardiance [Empagliflozin] Nausea And Vomiting   Simvastatin Diarrhea   Statins Other (See Comments)    achey joints   Xanax [Alprazolam] Anxiety   Family History  Problem Relation Age of Onset   Hypertension Mother    Heart disease Mother        CHF did not see doctor   Diabetes Maternal Grandmother    Diabetes Son    PE: BP 132/78   Pulse 86   Ht 6' (1.829 m)   Wt 271 lb  (122.9 kg)   SpO2 99%   BMI 36.75 kg/m  Wt Readings from Last 3 Encounters:  12/16/22 271 lb (122.9 kg)  10/01/22 267 lb 12.8 oz (121.5 kg)  08/09/22 258 lb 6.4 oz (117.2 kg)   Constitutional: overweight, in NAD Eyes:  EOMI, no exophthalmos ENT: no neck masses, no cervical lymphadenopathy Cardiovascular: RRR, No MRG Respiratory: CTA B Musculoskeletal: no deformities Skin:no rashes Neurological: no tremor with outstretched hands  ASSESSMENT: 1. DM2, insulin-dependent, uncontrolled, with complications - CAD - s/p AMI, s/p CABG - PAD - s/p angioplasty 02.2022 - A fib - CKD - DR - PN; h/o osteomyelitis L 5th toe  2. HL  PLAN:  1. Patient with longstanding, uncontrolled, type 2 diabetes, on basal-bolus insulin regimen, adjusted at last visit.  At that time, HbA1c was better, at 7.9% and he had another HbA1c obtained 4 months ago, which was even slightly lower, at 7.7%.  Reviewing the CGM trends, sugars were still above target at last visit, and increasing as the day went by, with a decrease after dinner.  He was taking insulin occasionally too late after lunch and also sometimes after dinner.  We discussed about trying to take NovoLog 15 minutes before dinner.  He was trying to take it approximately 1 hour before dinner with variable success.  His long-acting insulin did not appear to be sufficient so I advised him to increase this.  As he was putting honey in his coffee, I advised him that he may need 4 to 5 units of insulin before coffee.  We also switched from NovoLog to Humalog per insurance preference. -He had  N/V/AP before last visit, which she attributed to [place].  Reviewing his chart, glucose was mostly in the 300s with a CO2 of 22, at the lower limit of the target range. My suggestion was to hold off restarting an SGLT2 inhibitor at that time and he wholeheartedly agreed. CGM interpretation: -At today's visit, we reviewed his CGM downloads: It appears that 37% of values are  in target range (goal >70%), while 63% are higher than 180 (goal <25%), and 0% are lower than 70 (goal <4%).  The calculated average blood sugar is 198.  The projected HbA1c for the next 3 months (GMI) is 8.0%. -Reviewing the CGM trends, patient mentions that his sugars have worsened in the last 3 weeks due to the poor diet.  He is not cooking and eats many snacks.  We discussed about the need to improve the meals but also, since sugars appear to be elevated at all times of the day, I recommended to increase his Lantus dose.  Upon questioning, he did not increase it at last visit, as advised.  He also occasionally forgets to take it at night and takes it in the middle of the night.  I advised him to take  it after dinner.  If he does not feel that he could take it then, he could move into the morning.  I also advised him to continue to vary the dose of mealtime insulin. - I suggested to:  Patient Instructions  Please increase: - Lantus 30-35 units at bedtime   Continue: - Humalog U200 15-20 (25) units 15 min before the 3 meals  May need to bolus 4-5 units before coffee.  Please return in 3-4 months.    - we checked his HbA1c: 7.4% (lower) - advised to check sugars at different times of the day - 4x a day, rotating check times - advised for yearly eye exams >> he is UTD - return to clinic in 3-4 months  2. HL -Reviewed latest lipid panel from 08/2021: LDL above the target, HDL low, triglycerides at goal: Lab Results  Component Value Date   CHOL 156 09/25/2021   HDL 37.80 (L) 09/25/2021   LDLCALC 94 09/25/2021   LDLDIRECT 173.0 07/01/2018   TRIG 120.0 09/25/2021   CHOLHDL 4 09/25/2021  -He is on Livalo 1 mg daily and Zetia 10 mg daily without side effects.  He was started on Repatha.  The first dose was affordable but now in the donut hole, this is expensive.  He is trying to obtain it through the patient assistance. -He is due for another lipid panel but he prefers to wait until his  appointment with cardiology next month  Carlus Pavlov, MD PhD [facility] Endocrinology

## 2022-12-16 NOTE — Patient Instructions (Addendum)
Please increase: - Lantus 30-35 units at bedtime   Continue: - Humalog U200 15-20 (25) units 15 min before the 3 meals  May need to bolus 4-5 units before coffee.  Please return in 3-4 months.

## 2022-12-17 ENCOUNTER — Other Ambulatory Visit: Payer: Self-pay | Admitting: Family Medicine

## 2022-12-25 ENCOUNTER — Encounter: Payer: Medicare Other | Admitting: Family Medicine

## 2023-01-02 ENCOUNTER — Other Ambulatory Visit: Payer: Self-pay

## 2023-01-07 DIAGNOSIS — E113213 Type 2 diabetes mellitus with mild nonproliferative diabetic retinopathy with macular edema, bilateral: Secondary | ICD-10-CM | POA: Diagnosis not present

## 2023-01-07 DIAGNOSIS — H35013 Changes in retinal vascular appearance, bilateral: Secondary | ICD-10-CM | POA: Diagnosis not present

## 2023-01-07 DIAGNOSIS — H40023 Open angle with borderline findings, high risk, bilateral: Secondary | ICD-10-CM | POA: Diagnosis not present

## 2023-01-07 DIAGNOSIS — D3131 Benign neoplasm of right choroid: Secondary | ICD-10-CM | POA: Diagnosis not present

## 2023-01-10 ENCOUNTER — Other Ambulatory Visit: Payer: Self-pay | Admitting: Family Medicine

## 2023-01-11 ENCOUNTER — Other Ambulatory Visit: Payer: Self-pay | Admitting: Pulmonary Disease

## 2023-01-17 ENCOUNTER — Ambulatory Visit: Payer: Medicare Other | Attending: Cardiovascular Disease | Admitting: Cardiovascular Disease

## 2023-01-17 ENCOUNTER — Other Ambulatory Visit: Payer: Self-pay | Admitting: Cardiovascular Disease

## 2023-01-17 ENCOUNTER — Encounter: Payer: Self-pay | Admitting: Cardiovascular Disease

## 2023-01-17 VITALS — BP 160/80 | HR 78 | Ht 72.0 in | Wt 271.0 lb

## 2023-01-17 DIAGNOSIS — I350 Nonrheumatic aortic (valve) stenosis: Secondary | ICD-10-CM

## 2023-01-17 DIAGNOSIS — Z951 Presence of aortocoronary bypass graft: Secondary | ICD-10-CM | POA: Diagnosis not present

## 2023-01-17 DIAGNOSIS — I2510 Atherosclerotic heart disease of native coronary artery without angina pectoris: Secondary | ICD-10-CM | POA: Diagnosis not present

## 2023-01-17 DIAGNOSIS — E118 Type 2 diabetes mellitus with unspecified complications: Secondary | ICD-10-CM

## 2023-01-17 DIAGNOSIS — M25473 Effusion, unspecified ankle: Secondary | ICD-10-CM | POA: Diagnosis not present

## 2023-01-17 DIAGNOSIS — E1169 Type 2 diabetes mellitus with other specified complication: Secondary | ICD-10-CM

## 2023-01-17 DIAGNOSIS — I10 Essential (primary) hypertension: Secondary | ICD-10-CM | POA: Diagnosis not present

## 2023-01-17 DIAGNOSIS — Z794 Long term (current) use of insulin: Secondary | ICD-10-CM

## 2023-01-17 DIAGNOSIS — I739 Peripheral vascular disease, unspecified: Secondary | ICD-10-CM

## 2023-01-17 DIAGNOSIS — E785 Hyperlipidemia, unspecified: Secondary | ICD-10-CM | POA: Diagnosis not present

## 2023-01-17 DIAGNOSIS — R079 Chest pain, unspecified: Secondary | ICD-10-CM | POA: Diagnosis not present

## 2023-01-17 DIAGNOSIS — Z8739 Personal history of other diseases of the musculoskeletal system and connective tissue: Secondary | ICD-10-CM

## 2023-01-17 MED ORDER — TORSEMIDE 20 MG PO TABS: ORAL_TABLET | ORAL | 0 refills | Status: DC

## 2023-01-17 MED ORDER — METOPROLOL SUCCINATE ER 25 MG PO TB24: 12.5000 mg | ORAL_TABLET | Freq: Every day | ORAL | 3 refills | Status: DC

## 2023-01-17 NOTE — Progress Notes (Unsigned)
Cardiology Office Note    Date:  01/19/2023   ID:  Martin Ponto Sr., DOB [birth year], MRN [id number]  PCP:  Shelva Majestic, MD  Cardiologist:  Nicki Guadalajara, MD   22 month F/u office visit   History of Present Illness:  Martin VASTINE Sr. is a 77 y.o. male who has a history of hypertension, paroxysmal arrhythmias, and type 2 diabetes mellitus with peripheral neuropathy who was followed by Dr. Romero Belling and sees Dr. Durene Cal.  On the morning of September 29, 2018 he developed new substernal chest pressure which persisted.  EMS was notified and ECG showed early inferior lateral ST elevation.  A inferior STEMI was activated.  I performed emergent cardiac catheterization demonstrated acute inferior STEMI secondary to an ulcerated plaque in the mid RCA with residual 90% stenosis with extensive thrombus burden but with TIMI-3 flow and probable distal embolization to the apical portion of the PDA vessel.  There is moderate stenosis of the PDA proximally.  He had significant concomitant CAD with 30% left main stenosis, 95 at near ostial LAD stenosis, 60% diagonal stenosis, 40 and 30% diffuse mid LAD stenoses, and 90% diffuse stenosis in the proximal portion of the distal marginal branch prior to bifurcation the circumflex vessel.  He ultimately underwent CABG x4 revascularization surgery by Dr. Dorris Fetch with a LIMA to the LAD, SVG to first diagonal, SVG to PDA and graft to the PLA (Y graft off PDA vein) with endoscopic vein harvest from the right leg.  An intraoperative echo showed mild LVH, left atrial dilatation, trace MR and EF of 55 to 65%.  His postoperative course was complicated by profound weakness, orthostatic hypotension, anemia, atrial fibrillation, acute kidney injury, severe deconditioning and depression.  He went to a skilled nursing facility on October 19, 2018 and ultimately went home in August 2020.  His amiodarone was discontinued when he saw Dr. Dorris Fetch in the office on September 1.   He last saw Dr. Dorris Fetch on February 02, 2019.  The patient states that he has lost almost 50 pounds since his heart surgery.  He states he has a history of PAF for many years and typically would have 1-2 occurrences per year.  He admits to very poor sleep and typically wakes up every 1-2 hours.  His sleep is nonrestorative.  He admits to significant leg swelling following his surgery.     I saw him for an initial in office evaluation on February 04, 2019.  At that time his blood pressure was significantly elevated despite taking amlodipine 5 mg, furosemide 40 mg, and metoprolol 12.5 mg twice a day.  Since his renal function improved, I recommended initiation of low-dose losartan at 25 mg daily.  At that time, he was continuing to experience significant swelling despite taking furosemide and recommended discontinuance of furosemide and switch him to torsemide.  He presented with a 2.5 mg since this may have been contributory to his lower extremity edema.  I recommended he discontinue full dose aspirin and substitute this with 81 mg aspirin.  Since he was only taking 1 mg of Livalo twice per week I suggested he try increasing this to 1 mg every other day for 2 weeks and then increase to daily as tolerated.  I recommended support stockings.  I was also concerned about sleep apnea due to his longstanding history of poor sleep with frequent awakenings, daytime sleepiness, and loud snoring.    He was evaluated in a telemedicine  visit on March 16, 2019. Martin [name] was followed by Dr. Everardo All for his diabetes mellitus and blood sugars have improved.  He has extreme anxiety about Covid concerns and called the office requesting a telemedicine visit rather than an office evaluation.  He does not want to pursue a sleep evaluation until after the Covid pandemic has resolved.  He denies any anginal symptoms.  His lower extremity has improved off amlodipine.     I last saw him on March 12, 2021. Since his prior  evaluation  he had a nonhealing ulcer of his left foot with osteomyelitis and had undergone hyperbaric chamber treatment without much benefit.  Ultimately he underwent peripheral vascular intervention of the left lower extremity with laser atherectomy of the popliteal and proximal anterior tibial arteries with balloon and drug-coated angioplasty on June 16, 2020 by Dr. Laurena Spies an interventional radiologist.  He has felt improved with the improved blood flow to his lower extremity and this led to successful healing of his wound.  s.  He is on aspirin but apparently is no longer on Plavix.  He has been on Zetia 10 mg and Livalo but apparently has been unable to tolerate Livalo.  He did not tolerate other statins.  He has experienced occasional palpitations.  He continues to be on losartan 50 mg in the morning and at bedtime and takes metoprolol tartrate 50 mg twice a day.  He is diabetic on Ozempic and insulin.  He takes torsemide for leg edema.    Since I last saw him, he was evaluated by Dr. Gaynelle Arabian on May 03, 2022 for chest discomfort.  He was under increased stress due to the death of his wife for metastatic breast cancer.  He underwent a Lexiscan Myoview study which was interpreted as low risk.  There was mild diaphragmatic attenuation of the apical inferior and inferolateral wall.   He had undergone a follow-up echo Doppler study on May 29, 2022 which showed an EF of 65 to 70% with grade 1 diastolic dysfunction.  There was mild aortic stenosis with a mean gradient of 9.  He was seen by Edd Fabian, NP on June 20, 2022.  He has been maintained on aspirin/Plavix for DAPT.  He is on Zetia 10 mg, Livalo 1 mg in addition to Repatha for hyperlipidemia.  However, he has reached the "donut hole" and therefore stopped taking Repatha due to cost and will not restart until January.  He denies any anginal symptoms and is on isosorbide 30 mg, losartan 50 mg twice a day.  At times he notes some  occasional swelling and has been taking only as needed.  He states he still has some osteomyelitis of his left foot.  He continues to see Dr. Tana Conch for primary care.  He also will be seeing Dr. Ernest Haber since Dr. Everardo All is no longer in practice for his diabetes mellitus.  At times he does experience some ankle swelling.  He presents for evaluation.   Past Medical History:  Diagnosis Date   A-fib (HCC)    CKD (chronic kidney disease) stage 3, GFR 30-59 ml/min (HCC) 08/29/2020   Diabetes mellitus    Heart attack (HCC) 10/01/2018   Pt had open heart surgery   Hyperkalemia 08/29/2020   Hypertension    Osteomyelitis of fifth toe of left foot (HCC) 05/08/2020   Simple chronic bronchitis (HCC) 05/08/2020   Tobacco abuse     Past Surgical History:  Procedure Laterality Date   CORONARY ARTERY  BYPASS GRAFT N/A 10/01/2018   Procedure: CORONARY ARTERY BYPASS GRAFTING (CABG) TIMES  FOUR USING LEFT MAMMARY ARTERY AND RIGHT GREATER SAPHEANOUS VEIN HARVESTED ENDOSCOPICALLY;  Surgeon: Loreli Slot, MD;  Location: [facility] OR;  Service: Open Heart Surgery;  Laterality: N/A;   HERNIA REPAIR     >10 years ago   IR ANGIOGRAM EXTREMITY LEFT  06/16/2020   IR RADIOLOGIST EVAL & MGMT  12/24/2016   IR RADIOLOGIST EVAL & MGMT  05/30/2020   IR RADIOLOGIST EVAL & MGMT  07/13/2020   IR RADIOLOGIST EVAL & MGMT  09/06/2020   IR RADIOLOGIST EVAL & MGMT  12/26/2020   IR THORACENTESIS ASP PLEURAL SPACE W/IMG GUIDE  10/06/2018   IR US GUIDE VASC ACCESS RIGHT  06/16/2020   LEFT HEART CATH AND CORONARY ANGIOGRAPHY N/A 09/29/2018   Procedure: LEFT HEART CATH AND CORONARY ANGIOGRAPHY;  Surgeon: Lennette Bihari, MD;  Location: [facility] INVASIVE CV LAB;  Service: Cardiovascular;  Laterality: N/A;   open heart surfery     right shoulder surgery     around 2014   TEE WITHOUT CARDIOVERSION N/A 10/01/2018   Procedure: TRANSESOPHAGEAL ECHOCARDIOGRAM (TEE);  Surgeon: Loreli Slot, MD;  Location: [facility] OR;  Service: Open Heart  Surgery;  Laterality: N/A;    Current Medications: Outpatient Medications Prior to Visit  Medication Sig Dispense Refill   aspirin 81 MG EC tablet Take 1 tablet (81 mg total) by mouth daily.     azelastine (ASTELIN) 0.1 % nasal spray Place 1 spray into both nostrils 2 (two) times daily. 30 mL 12   clopidogrel (PLAVIX) 75 MG tablet Take 1 tablet (75 mg total) by mouth daily. 90 tablet 3   Continuous Blood Gluc Receiver (FREESTYLE LIBRE 2 READER) DEVI See admin instructions.     Continuous Blood Gluc Sensor (FREESTYLE LIBRE 2 SENSOR) MISC 1 Device by Does not apply route every 14 (fourteen) days. 6 each 3   escitalopram (LEXAPRO) 5 MG tablet TAKE 1 TABLET(5 MG) BY MOUTH DAILY 90 tablet 3   ezetimibe (ZETIA) 10 MG tablet TAKE 1 TABLET BY MOUTH EVERY DAY 90 tablet 3   fluticasone (FLONASE) 50 MCG/ACT nasal spray Place 2 sprays into both nostrils daily as needed for allergies or rhinitis.     Fluticasone-Umeclidin-Vilant (TRELEGY ELLIPTA) 100-62.5-25 MCG/ACT AEPB INHALE 1 PUFF INTO THE LUNGS DAILY 60 each 1   HUMALOG KWIKPEN 200 UNIT/ML KwikPen ADMINISTER 15 TO 25 UNITS UNDER THE SKIN THREE TIMES DAILY BEFORE MEALS 12 mL 3   hydrOXYzine (ATARAX) 25 MG tablet Take 25 mg by mouth every 6 (six) hours as needed for anxiety.     insulin glargine (LANTUS SOLOSTAR) 100 UNIT/ML Solostar Pen Inject 35 Units into the skin at bedtime. 30 mL 3   Insulin Pen Needle (BD PEN NEEDLE NANO 2ND GEN) 32G X 4 MM MISC USE AS INSTRUCTED TO ADMINISTER INSULIN 5X DAILY 500 each 1   isosorbide mononitrate (IMDUR) 30 MG 24 hr tablet Take 1 tablet (30 mg total) by mouth daily. 90 tablet 3   losartan (COZAAR) 50 MG tablet TAKE 1 TABLET BY MOUTH EVERY MORNING AND AT BEDTIME. 180 tablet 3   montelukast (SINGULAIR) 10 MG tablet Take 1 tablet (10 mg total) by mouth at bedtime. 30 tablet 11   nitroGLYCERIN (NITROSTAT) 0.4 MG SL tablet Place 1 tablet (0.4 mg total) under the tongue every 5 (five) minutes as needed for chest pain (if  pain does not resolve with 2nd dose- seek care). 20 tablet 5  ondansetron (ZOFRAN-ODT) 4 MG disintegrating tablet Take 1 tablet (4 mg total) by mouth every 8 (eight) hours as needed for up to 20 doses for nausea or vomiting. 20 tablet 0   ONETOUCH VERIO test strip CHECK LEVELS 4 TIMES DAILY 100 strip 2   Pitavastatin Calcium (LIVALO) 1 MG TABS Take 1 tablet (1 mg total) by mouth daily. 90 tablet 3   torsemide (DEMADEX) 20 MG tablet MUST MAKE APPOINTMENT FOR FUTURE REFILL LAST ATTEMPT. TAKE 2 TABLETS BY MOUTH EVERY DAY (Patient taking differently: Take 40 mg by mouth daily as needed (For fluid).) 30 tablet 0   Evolocumab (REPATHA SURECLICK) 140 MG/ML SOAJ Inject 140 mg into the skin every 14 (fourteen) days. (Patient not taking: Reported on 01/17/2023) 6 mL 3   insulin aspart (NOVOLOG FLEXPEN) 100 UNIT/ML FlexPen Inject 20-25 Units into the skin 3 (three) times daily with meals. 3 times a day (just before each meal), 30-35-35 units, and pen needles 3/day (Patient not taking: Reported on 01/17/2023) 75 mL 3   No facility-administered medications prior to visit.     Allergies:   Jardiance [empagliflozin], Simvastatin, Statins, and Xanax [alprazolam]   Social History   Socioeconomic History   Marital status: Married    Spouse name: Not on file   Number of children: Not on file   Years of education: Not on file   Highest education level: Not on file  Occupational History   Not on file  Tobacco Use   Smoking status: Former    Current packs/day: 0.00    Average packs/day: 0.8 packs/day for 2.0 years (1.5 ttl pk-yrs)    Types: Cigarettes    Start date: 2002    Quit date: 04/29/2002    Years since quitting: 20.7   Smokeless tobacco: Never   Tobacco comments:    smoked for 10 years on and off  Substance and Sexual Activity   Alcohol use: Yes    Comment: occasionally   Drug use: No   Sexual activity: Yes  Other Topics Concern   Not on file  Social History Narrative   Married. 3 kids. 7  grandkids. No greatgrandkids.       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      Hobbies: golf previously, yardwork   Social Determinants of Health   Financial Resource Strain: Not on file  Food Insecurity: No Food Insecurity (08/05/2022)   Hunger Vital Sign    Worried About Running Out of Food in the Last Year: Never true    Ran Out of Food in the Last Year: Never true  Transportation Needs: No Transportation Needs (08/05/2022)   PRAPARE - Administrator, Civil Service (Medical): No    Lack of Transportation (Non-Medical): No  Physical Activity: Not on file  Stress: Not on file  Social Connections: Unknown (08/09/2022)   [facility]   Social Network    Social Network: Not on file    He is retired from the [employer]ilities [employer]  Family History:  The patient's family history includes Diabetes in his maternal grandmother and son; Heart disease in his mother; Hypertension in his mother.   ROS General: Negative; No fevers, chills, or night sweats; history of obesity HEENT: Negative; No changes in vision or hearing, sinus congestion, difficulty swallowing Pulmonary: Negative; No cough, wheezing, shortness of breath, hemoptysis Cardiovascular: See HPI GI: Negative; No nausea, vomiting, diarrhea, or abdominal pain GU: Negative; No dysuria, hematuria, or  difficulty voiding Musculoskeletal: Negative; no myalgias, joint pain, or weakness Hematologic/Oncology: Negative; no easy bruising, bleeding Endocrine: Diabetes mellitus with diabetic neuropathy Neuro: Negative; no changes in balance, headaches Skin: Previous nonhealing wound of his left foot Psychiatric: Negative; No behavioral problems, depression Sleep: Poor sleep with snoring, daytime sleepiness, hypersomnolence, no bruxism, restless legs, hypnogognic hallucinations, no cataplexy Other comprehensive 14 point system review is negative.   PHYSICAL EXAM:    VS:  BP (!) 160/80   Pulse 78   Ht 6' (1.829 m)   Wt 271 lb (122.9 kg)   SpO2 98%   BMI 36.75 kg/m     Repeat blood pressure by me was 152/82  Wt Readings from Last 3 Encounters:  01/17/23 271 lb (122.9 kg)  12/16/22 271 lb (122.9 kg)  10/01/22 267 lb 12.8 oz (121.5 kg)     Physical Exam BP (!) 160/80   Pulse 78   Ht 6' (1.829 m)   Wt 271 lb (122.9 kg)   SpO2 98%   BMI 36.75 kg/m  General: Alert, oriented, no distress.  Skin: normal turgor, no rashes, warm and dry HEENT: Normocephalic, atraumatic. Pupils equal round and reactive to light; sclera anicteric; extraocular muscles intact;  Nose without nasal septal hypertrophy Mouth/Parynx benign; Mallinpatti scale 4 Neck: No JVD, no carotid bruits; normal carotid upstroke Lungs: clear to ausculatation and percussion; no wheezing or rales Chest wall: without tenderness to palpitation Heart: PMI not displaced, RRR, s1 s2 normal, 1/6 systolic murmur, no diastolic murmur, no rubs, gallops, thrills, or heaves Abdomen: soft, nontender; no hepatosplenomehaly, BS+; abdominal aorta nontender and not dilated by palpation. Back: no CVA tenderness Pulses 2+ Musculoskeletal: full range of motion, normal strength, no joint deformities Extremities: Ankle edema 1+ on the left, trace on the right; no clubbing cyanosis , Homan's sign negative  Neurologic: grossly nonfocal; Cranial nerves grossly wnl Psychologic: Normal mood and affect     Studies/Labs Reviewed:   EKG Interpretation Date/Time:  Friday January 17 2023 08:06:21 EDT Ventricular Rate:  78 PR Interval:  158 QRS Duration:  88 QT Interval:  368 QTC Calculation: 419 R Axis:   73  Text Interpretation: Normal sinus rhythm Normal ECG When compared with ECG of 03-Apr-2022 05:22, No significant change was found Confirmed by Nicki Guadalajara ([id number]) on 01/17/2023 9:11:48 AM     March 12, 2021 ECG (independently read by me): NSR at 71, LAE, no ectopy  October 2020 ECG  (independently read by me): Normal sinus rhythm at 90 bpm.  Possible left atrial enlargement nondiagnostic T changes..  Over 148 ms and QTc interval 467 ms  Recent Labs:    Latest Ref Rng & Units 01/17/2023    9:00 AM 06/19/2022    8:45 AM 04/03/2022    5:07 AM  BMP  Glucose 70 - 99 mg/dL 604  540  981   BUN 8 - 27 mg/dL 19  28  28    Creatinine 0.76 - 1.27 mg/dL 1.91  4.78  2.95   BUN/Creat Ratio 10 - 24 15     Sodium 134 - 144 mmol/L 137  142  131   Potassium 3.5 - 5.2 mmol/L 5.5  5.3 No hemolysis seen  3.9   Chloride 96 - 106 mmol/L 101  103  99   CO2 20 - 29 mmol/L 23  28  22    Calcium 8.6 - 10.2 mg/dL 9.2  9.6  8.4         Latest Ref Rng & Units 01/17/2023  9:00 AM 06/19/2022    8:45 AM 12/18/2021    8:44 AM  Hepatic Function  Total Protein 6.0 - 8.5 g/dL 7.0  7.4  7.3   Albumin 3.8 - 4.8 g/dL 4.4  4.5  4.2   AST 0 - 40 IU/L 14  11  14    ALT 0 - 44 IU/L 11  8  15    Alk Phosphatase 44 - 121 IU/L 59  57  53   Total Bilirubin 0.0 - 1.2 mg/dL 1.0  1.3  1.0        Latest Ref Rng & Units 01/17/2023    9:00 AM 06/19/2022    8:45 AM 04/03/2022    5:07 AM  CBC  WBC 3.4 - 10.8 x10E3/uL 6.2  6.7  7.8   Hemoglobin 13.0 - 17.7 g/dL 40.1  02.7  25.3   Hematocrit 37.5 - 51.0 % 39.8  38.8  40.0   Platelets 150 - 450 x10E3/uL 251  259.0  256    Lab Results  Component Value Date   MCV 95 01/17/2023   MCV 91.3 06/19/2022   MCV 93.0 04/03/2022   Lab Results  Component Value Date   TSH 3.040 01/17/2023   Lab Results  Component Value Date   HGBA1C 7.9 (A) 07/22/2022     BNP    Component Value Date/Time   BNP 111.2 (H) 07/20/2019 1639    ProBNP    Component Value Date/Time   PROBNP 9.0 08/17/2013 1005     Lipid Panel     Component Value Date/Time   CHOL 136 01/17/2023 0900   TRIG 80 01/17/2023 0900   HDL 46 01/17/2023 0900   CHOLHDL 3.0 01/17/2023 0900   CHOLHDL 4 09/25/2021 1422   VLDL 24.0 09/25/2021 1422   LDLCALC 74 01/17/2023 0900   LDLDIRECT 173.0  07/01/2018 1118   LABVLDL 16 01/17/2023 0900     RADIOLOGY: No results found.    Additional studies/ records that were reviewed today include:   Emergent catheterization: September 29, 2018 Mid RCA lesion is 90% stenosed. Ost RPDA lesion is 30% stenosed. RPDA-1 lesion is 40% stenosed. RPDA-2 lesion is 50% stenosed. RPDA-3 lesion is 100% stenosed. Ost LAD lesion is 95% stenosed. Mid Cx to Dist Cx lesion is 90% stenosed. Ost 1st Diag lesion is 60% stenosed. Prox LAD to Mid LAD lesion is 40% stenosed. Mid LAD lesion is 30% stenosed. Ost LM to Mid LM lesion is 30% stenosed.   Acute inferior STEMI secondary to ulcerated plaque in the mid RCA with residual 90% stenosis with extensive thrombus but with brisk TIMI-3 flow and probable distal embolization to the apical portion of the PDA vessel.  There is moderate stenoses in the PDA proximal to the distal embolization.   Significant concomitant CAD with 30% left main stenosis; 95% near ostial LAD stenosis, 60% diagonal stenosis with 40 and 30% diffuse mid LAD stenosis; normal high marginal ramus like vessel, with 90% diffuse stenosis in the proximal portion of a distal marginal branch prior to its bifurcation.   LVEDP 11 mm.   Transient development of tachycardia most likely paroxysmal atrial fibrillation with ventricular rate at 150 bpm which responded to metoprolol IV 2.5 mg x 2.   RECOMMENDATION: Angiograms were reviewed with Dr. Eldridge Dace as well as with Dr. Kathlee Nations Tright.  Although the culprit vessel is in the mid RCA which can be intervened upon, with the high-grade subtotal near ostial LAD stenosis in an area that may be difficult to  stent due to potential jeopardy to a ramus-like vessel in the circumflex vessel in addition to the patient's concomitant CAD it was felt that the patient should undergo elective CABG revascularization following stabilization.  As result he was started on Aggrastat bolus plus infusion and with plans to resume  heparin.  An echo Doppler study will be done to reevaluate LV function.  He will be started on low-dose beta-blocker therapy, high potency statin therapy, with plans for CABG revascularization surgery later this week.    LEXISCAN MYOVIEW:  05/29/2022   Findings are consistent with no ischemia. The study is low risk.   No ST deviation was noted.   LV perfusion is abnormal. There is no evidence of ischemia. There is no evidence of infarction. Defect 1: There is a medium defect with mild reduction in uptake present in the apical to mid inferior and inferolateral location(s) that is fixed. There is normal wall motion in the defect area. Consistent with artifact.   Left ventricular function is normal. Nuclear stress EF: 61 %. The left ventricular ejection fraction is normal (55-65%). End diastolic cavity size is normal. End systolic cavity size is normal.   Prior study not available for comparison.   There is an area of mildly reduced perfusion at rest in the mid to apical inferior and inferolateral wall. This improves with stress. Given low counts of study and extracardiac activity, suspect this is artifact.    ECHO: 05/29/2022  1. Left ventricular ejection fraction, by estimation, is 65 to 70%. The  left ventricle has normal function. The left ventricle has no regional  wall motion abnormalities. Left ventricular diastolic parameters are  consistent with Grade I diastolic  dysfunction (impaired relaxation).   2. Right ventricular systolic function is normal. The right ventricular  size is normal.   3. The mitral valve is normal in structure. Trivial mitral valve  regurgitation. No evidence of mitral stenosis.   4. The aortic valve is calcified. Aortic valve regurgitation is trivial.  Mild aortic valve stenosis. Aortic valve area, by VTI measures 1.72 cm.  Aortic valve mean gradient measures 9.0 mmHg. Aortic valve Vmax measures  2.01 m/s.   5. The inferior vena cava is normal in size with  greater than 50%  respiratory variability, suggesting right atrial pressure of 3 mmHg.      ASSESSMENT:    1. CAD in native artery   2. S/P CABG x 4   3. Essential hypertension   4. Ankle edema   5. Hyperlipidemia associated with type 2 diabetes mellitus (HCC)   6. Type 2 diabetes mellitus with complication, with long-term current use of insulin (HCC)   7. Mild aortic stenosis   8. PAD (peripheral artery disease) (HCC)   9. History of osteomyelitis     PLAN:  Martin [name] is a 77 year-old gentleman who has a history of hypertension, obesity, diabetes mellitus, as well as paroxysmal arrhythmias for several years.  He suffered an acute inferolateral STEMI secondary to ruptured plaque in his mid RCA resulting in distal embolization to the apical PDA vessel but at presentation to the hospital he was pain-free and due to concomitant CAD CABG revascularization was recommended.  An intraoperative TEE revealed normal LV function.  He had a LIMA placed to his LAD, vein graft to diagonal vessel, and a Y graft placed to the PDA and PLA.  His postoperative course was complicated by PAF, anemia, orthostatic hypotension, acute kidney injury, severe deconditioning and depression.  He required  skilled nursing facility post hospitalization.  He had  been on amlodipine 5 mg, furosemide 40 mg, metoprolol 12.5 mg twice a day.   His renal function subsequently improved and losartan was added to his regimen.   I last saw him in November 2022 but prior to that evaluation had not seen him since 2 years.  In the interim he had developed nonhealing wound ulcer with osteomyelitis and received several courses of antibiotics and required hyperbaric oxygen chamber.  He ultimately underwent  left lower extremity angiogram, laser atherectomy of the popliteal and proximal anterior tibial arteries with balloon and drug-coated angioplasty in February 2022 by Dr. Elby Showers in interventional radiology.  With reestablishment of  improved blood flow, his wound ultimately healed.  Presently his blood pressure has been elevated and he does experience occasional palpitations.  Since I last saw him, he had undergone a follow-up echo Doppler study in January 2024 which showed normal LV function with EF 65 to 70% without wall motion abnormalities.  There was grade 1 diastolic dysfunction.  There was mild aortic stenosis with a mean gradient of 9 mm.  Due to the development of mild chest pain contributed by increased stress following the death of his wife, he also underwent a Lexiscan Myoview study ordered by Dr. Bjorn Pippin which was considered low risk and showed mild diaphragmatic attenuation inferiorly.  He has hyperlipidemia and apparently did not tolerate most statins but has been able to tolerate low-dose Livalo and has been on Zetia.  He had been started on Repatha with significant benefit but unfortunately due to the "donut hole" he has stopped taking his PCSK9 in addition due to cost.  I have recommended he undergo follow-up laboratory with a comprehensive metabolic panel, CBC, TSH, fasting lipid panel, and will also check an LP(a).  He does have blood pressure elevation today.  I am adding low-dose metoprolol succinate 12.5 mg to his medical regimen.  I have also recommended he initiate torsemide 20 mg at least every other day and depending upon leg swelling he can adjust his dose to daily or every third day.  I will see him in 4 to 6 months for follow-up evaluation.    Medication Adjustments/Labs and Tests Ordered: Current medicines are reviewed at length with the patient today.  Concerns regarding medicines are outlined above.  Medication changes, Labs and Tests ordered today are listed in the Patient Instructions below. Patient Instructions  Medication Instructions:  BEGIN THE METOPROLOL SUCCINATE 12.5MG  ONE TABLET DAILY.  RESTART THE TORSEMIDE 20MG . TAKE THIS TABLET EVERY OTHER DAY FOR ANKLE SWELLING.  *If you need a refill  on your cardiac medications before your next appointment, please call your pharmacy*   Lab Work: LIPIDS, CMET, CBC, TSH, LPa If you have labs (blood work) drawn today and your tests are completely normal, you will receive your results only by: MyChart Message (if you have MyChart) OR A paper copy in the mail If you have any lab test that is abnormal or we need to change your treatment, we will call you to review the results.   Testing/Procedures: NONE   Follow-Up: At [facility], you and your health needs are our priority.  As part of our continuing mission to provide you with exceptional heart care, we have created designated Provider Care Teams.  These Care Teams include your primary Cardiologist (physician) and Advanced Practice Providers (APPs -  Physician Assistants and Nurse Practitioners) who all work together to provide you with the care you need,  when you need it.  We recommend signing up for the patient portal called "MyChart".  Sign up information is provided on this After Visit Summary.  MyChart is used to connect with patients for Virtual Visits (Telemedicine).  Patients are able to view lab/test results, encounter notes, upcoming appointments, etc.  Non-urgent messages can be sent to your provider as well.   To learn more about what you can do with MyChart, go to [email or web address].    Your next appointment:   4 month(s)  Provider:   Edd Fabian, FNP          Signed, Nicki Guadalajara, MD  01/19/2023 5:08 PM    [facility] [address], [address], [place], [place]  [address] Phone: [phone]

## 2023-01-17 NOTE — Patient Instructions (Addendum)
Medication Instructions:  BEGIN THE METOPROLOL SUCCINATE 12.5MG  ONE TABLET DAILY.  RESTART THE TORSEMIDE 20MG . TAKE THIS TABLET EVERY OTHER DAY FOR ANKLE SWELLING.  *If you need a refill on your cardiac medications before your next appointment, please call your pharmacy*   Lab Work: LIPIDS, CMET, CBC, TSH, LPa If you have labs (blood work) drawn today and your tests are completely normal, you will receive your results only by: MyChart Message (if you have MyChart) OR A paper copy in the mail If you have any lab test that is abnormal or we need to change your treatment, we will call you to review the results.   Testing/Procedures: NONE   Follow-Up: At [facility], you and your health needs are our priority.  As part of our continuing mission to provide you with exceptional heart care, we have created designated Provider Care Teams.  These Care Teams include your primary Cardiologist (physician) and Advanced Practice Providers (APPs -  Physician Assistants and Nurse Practitioners) who all work together to provide you with the care you need, when you need it.  We recommend signing up for the patient portal called "MyChart".  Sign up information is provided on this After Visit Summary.  MyChart is used to connect with patients for Virtual Visits (Telemedicine).  Patients are able to view lab/test results, encounter notes, upcoming appointments, etc.  Non-urgent messages can be sent to your provider as well.   To learn more about what you can do with MyChart, go to [email or web address].    Your next appointment:   4 month(s)  Provider:   Edd Fabian, FNP

## 2023-01-19 ENCOUNTER — Encounter: Payer: Self-pay | Admitting: Cardiovascular Disease

## 2023-01-21 LAB — COMPREHENSIVE METABOLIC PANEL
ALT: 11 IU/L (ref 0–44)
AST: 14 IU/L (ref 0–40)
Albumin: 4.4 g/dL (ref 3.8–4.8)
Alkaline Phosphatase: 59 IU/L (ref 44–121)
BUN/Creatinine Ratio: 15 (ref 10–24)
BUN: 19 mg/dL (ref 8–27)
Bilirubin Total: 1 mg/dL (ref 0.0–1.2)
CO2: 23 mmol/L (ref 20–29)
Calcium: 9.2 mg/dL (ref 8.6–10.2)
Chloride: 101 mmol/L (ref 96–106)
Creatinine, Ser: 1.3 mg/dL — ABNORMAL HIGH (ref 0.76–1.27)
Globulin, Total: 2.6 g/dL (ref 1.5–4.5)
Glucose: 232 mg/dL — ABNORMAL HIGH (ref 70–99)
Potassium: 5.5 mmol/L — ABNORMAL HIGH (ref 3.5–5.2)
Sodium: 137 mmol/L (ref 134–144)
Total Protein: 7 g/dL (ref 6.0–8.5)
eGFR: 56 mL/min/{1.73_m2} — ABNORMAL LOW (ref >59)

## 2023-01-21 LAB — TSH: TSH: 3.04 u[IU]/mL (ref 0.450–4.500)

## 2023-01-21 LAB — LIPID PANEL
Chol/HDL Ratio: 3 ratio (ref 0.0–5.0)
Cholesterol, Total: 136 mg/dL (ref 100–199)
HDL: 46 mg/dL (ref >39)
LDL Chol Calc (NIH): 74 mg/dL (ref 0–99)
Triglycerides: 80 mg/dL (ref 0–149)
VLDL Cholesterol Cal: 16 mg/dL (ref 5–40)

## 2023-01-21 LAB — CBC
Hematocrit: 39.8 % (ref 37.5–51.0)
Hemoglobin: 12.5 g/dL — ABNORMAL LOW (ref 13.0–17.7)
MCH: 29.9 pg (ref 26.6–33.0)
MCHC: 31.4 g/dL — ABNORMAL LOW (ref 31.5–35.7)
MCV: 95 fL (ref 79–97)
Platelets: 251 10*3/uL (ref 150–450)
RBC: 4.18 x10E6/uL (ref 4.14–5.80)
RDW: 12.7 % (ref 11.6–15.4)
WBC: 6.2 10*3/uL (ref 3.4–10.8)

## 2023-01-21 LAB — LIPOPROTEIN A (LPA): Lipoprotein (a): 138.4 nmol/L — ABNORMAL HIGH (ref <75.0)

## 2023-01-22 ENCOUNTER — Encounter: Payer: Medicare Other | Admitting: Family Medicine

## 2023-02-07 ENCOUNTER — Other Ambulatory Visit: Payer: Self-pay

## 2023-02-07 DIAGNOSIS — E1151 Type 2 diabetes mellitus with diabetic peripheral angiopathy without gangrene: Secondary | ICD-10-CM

## 2023-02-07 MED ORDER — FREESTYLE LIBRE 2 SENSOR MISC: 1.0000 | 3 refills | Status: DC

## 2023-02-07 NOTE — Telephone Encounter (Signed)
Requested Prescriptions   Signed Prescriptions Disp Refills   Continuous Glucose Sensor (FREESTYLE LIBRE 2 SENSOR) MISC 6 each 3    Sig: 1 Device by Does not apply route every 14 (fourteen) days.    Authorizing Provider: Carlus Pavlov    Ordering User: Pollie Meyer

## 2023-02-25 ENCOUNTER — Other Ambulatory Visit: Payer: Self-pay | Admitting: Cardiology

## 2023-03-05 ENCOUNTER — Encounter: Payer: Self-pay | Admitting: Pulmonary Disease

## 2023-03-05 ENCOUNTER — Ambulatory Visit: Payer: Medicare Other | Admitting: Pulmonary Disease

## 2023-03-05 VITALS — BP 130/70 | HR 82 | Ht 72.0 in | Wt 279.0 lb

## 2023-03-05 DIAGNOSIS — R053 Chronic cough: Secondary | ICD-10-CM

## 2023-03-05 DIAGNOSIS — J984 Other disorders of lung: Secondary | ICD-10-CM | POA: Diagnosis not present

## 2023-03-05 DIAGNOSIS — J4530 Mild persistent asthma, uncomplicated: Secondary | ICD-10-CM

## 2023-03-05 NOTE — Progress Notes (Signed)
Synopsis: Referred in August 2023 for chronic cough by Tana Conch, MD  Subjective:   PATIENT ID: Martin Ponto [name]. GENDER: male DOB: 11-01-1945, MRN: [id number]  HPI  Chief Complaint  Patient presents with   Follow-up   Martin [name] is a 77 year old male, former smoker with atrial fibrillation, CKDIII, DMII, CAD, and hypertension who returns to pulmonary clinic for chronic cough.   Initial OV 12/03/21 He reports having cough over the past year. The cough is worse in the Spring. The cough can be productive. He went to the beach in July and reports his cough was better at that time. He will have increased cough after mowing his property near [place] which makes his cough worse.   He is taking allegra, flonase and astelin for his allergies. He denies any issues with his breathing or cough with cold air. He denies GERD.  He smoked for 4 years and quit in 2004. He is a retired Oceanographer. He had second hand smoke exposure in childhood from his parents.   OV 05/02/22 He was started on montelukast 10mg  daily and trelegy ellipta 1 puff daily at last visit. He took montelukast for a few days and did not notice any improvement in his cough so he stopped it. He never started trelegy ellipta.   He reports increased chest pressure/pain over the last month that has been relieved by nitroglycerin. He had an ER visit which did not show EKG changes or troponin leak. He has follow up with his heart doctor tomorrow. His wife recently passed away so he is experiencing increased anxiety due to this.    PFTs today show Mild restrictive defect.   OV 06/03/22 He is feeling better with less cough and mucous production. His breathing feels much better as well. He is using trelegy as needed.   OV today 03/05/23 The patient, with a history of anxiety, heart surgery, and respiratory issues, presents with postnasal drip. He attributes the postnasal drip to mowing grass and leaves. The patient  reports that the anxiety has been well-managed with escitalopram (Lexapro), which has also improved his blood pressure and heart rate. He underwent bypass surgery during the COVID-19 pandemic and has since experienced anxiety, which has been controlled with medication.  The patient uses Trelegy for his respiratory issues, which he believes is effective. However, he notes that the medication is expensive. He also reports a history of osteomyelitis in the left foot, which has been managed with antibiotics and improved circulation. The patient is also diabetic and monitors his feet due to this condition.  The patient mentions a history of heartburn, which he no longer experiences. He attributes this improvement to dietary changes and reduced meal sizes. He also reports a history of coughing, which has resolved. The patient checks his oxygen levels occasionally and reports that they are always within normal limits.  Past Medical History:  Diagnosis Date   A-fib (HCC)    CKD (chronic kidney disease) stage 3, GFR 30-59 ml/min (HCC) 08/29/2020   Diabetes mellitus    Heart attack (HCC) 10/01/2018   Pt had open heart surgery   Hyperkalemia 08/29/2020   Hypertension    Osteomyelitis of fifth toe of left foot (HCC) 05/08/2020   Simple chronic bronchitis (HCC) 05/08/2020   Tobacco abuse      Family History  Problem Relation Age of Onset   Hypertension Mother    Heart disease Mother        CHF did  not see doctor   Diabetes Maternal Grandmother    Diabetes Son      Social History   Socioeconomic History   Marital status: Married    Spouse name: Not on file   Number of children: Not on file   Years of education: Not on file   Highest education level: Not on file  Occupational History   Not on file  Tobacco Use   Smoking status: Former    Current packs/day: 0.00    Average packs/day: 0.8 packs/day for 2.0 years (1.5 ttl pk-yrs)    Types: Cigarettes    Start date: 2002    Quit date: 04/29/2002     Years since quitting: 20.8   Smokeless tobacco: Never   Tobacco comments:    smoked for 10 years on and off  Substance and Sexual Activity   Alcohol use: Yes    Comment: occasionally   Drug use: No   Sexual activity: Yes  Other Topics Concern   Not on file  Social History Narrative   Married. 3 kids. 7 grandkids. No greatgrandkids.       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      Hobbies: golf previously, yardwork   Social Determinants of Health   Financial Resource Strain: Not on file  Food Insecurity: No Food Insecurity (08/05/2022)   Hunger Vital Sign    Worried About Running Out of Food in the Last Year: Never true    Ran Out of Food in the Last Year: Never true  Transportation Needs: No Transportation Needs (08/05/2022)   PRAPARE - Administrator, Civil Service (Medical): No    Lack of Transportation (Non-Medical): No  Physical Activity: Not on file  Stress: Not on file  Social Connections: Unknown (08/09/2022)   Received from [facility]   Social Network    Social Network: Not on file  Intimate Partner Violence: Unknown (08/09/2022)   Received from [facility], [facility]   HITS    Physically Hurt: Not on file    Insult or Talk Down To: Not on file    Threaten Physical Harm: Not on file    Scream or Curse: Not on file     Allergies  Allergen Reactions   Jardiance [Empagliflozin] Nausea And Vomiting   Simvastatin Diarrhea   Statins Other (See Comments)    achey joints   Xanax [Alprazolam] Anxiety     Outpatient Medications Prior to Visit  Medication Sig Dispense Refill   aspirin 81 MG EC tablet Take 1 tablet (81 mg total) by mouth daily.     azelastine (ASTELIN) 0.1 % nasal spray Place 1 spray into both nostrils 2 (two) times daily. 30 mL 12   clopidogrel (PLAVIX) 75 MG tablet Take 1 tablet (75 mg total) by mouth daily. 90 tablet 3   Continuous Blood Gluc Receiver (FREESTYLE LIBRE 2 READER) DEVI  See admin instructions.     Continuous Glucose Sensor (FREESTYLE LIBRE 2 SENSOR) MISC 1 Device by Does not apply route every 14 (fourteen) days. 6 each 3   escitalopram (LEXAPRO) 5 MG tablet TAKE 1 TABLET(5 MG) BY MOUTH DAILY 90 tablet 3   Evolocumab (REPATHA SURECLICK) 140 MG/ML SOAJ Inject 140 mg into the skin every 14 (fourteen) days. 6 mL 3   ezetimibe (ZETIA) 10 MG tablet TAKE 1 TABLET BY MOUTH EVERY DAY 90 tablet 3   fluticasone (FLONASE) 50 MCG/ACT nasal spray Place 2 sprays into both  nostrils daily as needed for allergies or rhinitis.     Fluticasone-Umeclidin-Vilant (TRELEGY ELLIPTA) 100-62.5-25 MCG/ACT AEPB INHALE 1 PUFF INTO THE LUNGS DAILY 60 each 1   HUMALOG KWIKPEN 200 UNIT/ML KwikPen ADMINISTER 15 TO 25 UNITS UNDER THE SKIN THREE TIMES DAILY BEFORE MEALS 12 mL 3   hydrOXYzine (ATARAX) 25 MG tablet Take 25 mg by mouth every 6 (six) hours as needed for anxiety.     insulin aspart (NOVOLOG FLEXPEN) 100 UNIT/ML FlexPen Inject 20-25 Units into the skin 3 (three) times daily with meals. 3 times a day (just before each meal), 30-35-35 units, and pen needles 3/day 75 mL 3   insulin glargine (LANTUS SOLOSTAR) 100 UNIT/ML Solostar Pen Inject 35 Units into the skin at bedtime. 30 mL 3   Insulin Pen Needle (BD PEN NEEDLE NANO 2ND GEN) 32G X 4 MM MISC USE AS INSTRUCTED TO ADMINISTER INSULIN 5X DAILY 500 each 1   isosorbide mononitrate (IMDUR) 30 MG 24 hr tablet TAKE 1 TABLET BY MOUTH EVERY DAY 90 tablet 3   losartan (COZAAR) 50 MG tablet TAKE 1 TABLET BY MOUTH EVERY MORNING AND AT BEDTIME. 180 tablet 3   metoprolol succinate (TOPROL XL) 25 MG 24 hr tablet Take 0.5 tablets (12.5 mg total) by mouth daily. 90 tablet 3   montelukast (SINGULAIR) 10 MG tablet Take 1 tablet (10 mg total) by mouth at bedtime. 30 tablet 11   nitroGLYCERIN (NITROSTAT) 0.4 MG SL tablet Place 1 tablet (0.4 mg total) under the tongue every 5 (five) minutes as needed for chest pain (if pain does not resolve with 2nd dose- seek  care). 20 tablet 5   ondansetron (ZOFRAN-ODT) 4 MG disintegrating tablet Take 1 tablet (4 mg total) by mouth every 8 (eight) hours as needed for up to 20 doses for nausea or vomiting. 20 tablet 0   ONETOUCH VERIO test strip CHECK LEVELS 4 TIMES DAILY 100 strip 2   Pitavastatin Calcium (LIVALO) 1 MG TABS Take 1 tablet (1 mg total) by mouth daily. 90 tablet 3   torsemide (DEMADEX) 20 MG tablet TAKE 1 TABLET BY MOUTH EVERY OTHER DAY FOR ANKLE SWELLING 30 tablet 0   No facility-administered medications prior to visit.   Review of Systems  Constitutional:  Negative for chills, fever, malaise/fatigue and weight loss.  HENT:  Negative for congestion, sinus pain and sore throat.   Eyes: Negative.   Respiratory:  Positive for shortness of breath. Negative for cough, hemoptysis, sputum production and wheezing.   Cardiovascular:  Negative for chest pain, palpitations, orthopnea, claudication and leg swelling.  Gastrointestinal:  Negative for abdominal pain, heartburn, nausea and vomiting.  Genitourinary: Negative.   Musculoskeletal:  Negative for joint pain and myalgias.  Skin:  Negative for rash.  Neurological:  Negative for weakness.  Endo/Heme/Allergies:  Positive for environmental allergies.  Psychiatric/Behavioral: Negative.     Objective:   Vitals:   03/05/23 0840  BP: 130/70  Pulse: 82  SpO2: 97%  Weight: 279 lb (126.6 kg)  Height: 6' (1.829 m)    Physical Exam Constitutional:      General: He is not in acute distress. HENT:     Head: Normocephalic and atraumatic.  Eyes:     Conjunctiva/sclera: Conjunctivae normal.  Cardiovascular:     Rate and Rhythm: Normal rate and regular rhythm.     Pulses: Normal pulses.     Heart sounds: Normal heart sounds. No murmur heard. Pulmonary:     Effort: Pulmonary effort is normal.  Breath sounds: No wheezing or rhonchi.  Musculoskeletal:     Right lower leg: No edema.     Left lower leg: No edema.  Skin:    General: Skin is warm and  dry.  Neurological:     General: No focal deficit present.     Mental Status: He is alert.    CBC    Component Value Date/Time   WBC 6.2 01/17/2023 0900   WBC 6.7 06/19/2022 0845   RBC 4.18 01/17/2023 0900   RBC 4.25 06/19/2022 0845   HGB 12.5 (L) 01/17/2023 0900   HCT 39.8 01/17/2023 0900   PLT 251 01/17/2023 0900   MCV 95 01/17/2023 0900   MCH 29.9 01/17/2023 0900   MCH 30.5 04/03/2022 0507   MCHC 31.4 (L) 01/17/2023 0900   MCHC 33.2 06/19/2022 0845   RDW 12.7 01/17/2023 0900   LYMPHSABS 2.1 06/19/2022 0845   MONOABS 0.4 06/19/2022 0845   EOSABS 0.1 06/19/2022 0845   BASOSABS 0.0 06/19/2022 0845      Latest Ref Rng & Units 01/17/2023    9:00 AM 06/19/2022    8:45 AM 04/03/2022    5:07 AM  BMP  Glucose 70 - 99 mg/dL 161  096  045   BUN 8 - 27 mg/dL 19  28  28    Creatinine 0.76 - 1.27 mg/dL 4.09  8.11  9.14   BUN/Creat Ratio 10 - 24 15     Sodium 134 - 144 mmol/L 137  142  131   Potassium 3.5 - 5.2 mmol/L 5.5  5.3 No hemolysis seen  3.9   Chloride 96 - 106 mmol/L 101  103  99   CO2 20 - 29 mmol/L 23  28  22    Calcium 8.6 - 10.2 mg/dL 9.2  9.6  8.4    Chest imaging: HRCT Chest 05/31/22 1. Mild bilateral bronchial wall thickening with innumerable centrilobular nodules, primarily in the lower lungs, overall worsened when compared with the prior exam, findings are likely due to recurrent aspiration or infectious bronchiolitis. 2. Patulous esophagus, findings can be seen in the setting of esophageal dysmotility. 3. Severe coronary artery calcifications status post CABG. 4. Aortic Atherosclerosis (ICD10-I70.0).  PFT:    Latest Ref Rng & Units 05/02/2022   12:44 PM  PFT Results  FVC-Pre L 2.48   FVC-Predicted Pre % 55   FVC-Post L 2.62   FVC-Predicted Post % 58   Pre FEV1/FVC % % 76   Post FEV1/FCV % % 80   FEV1-Pre L 1.89   FEV1-Predicted Pre % 58   FEV1-Post L 2.09   DLCO uncorrected ml/min/mmHg 20.40   DLCO UNC% % 77   DLCO corrected ml/min/mmHg 21.36    DLCO COR %Predicted % 80   DLVA Predicted % 118   TLC L 5.10   TLC % Predicted % 68   RV % Predicted % 92     Labs:  Path:  Echo 05/29/22: LV EF 65-70%. Grade I diastolic dysfunction. RV systolic function is normal along with size. Mild aortic valve stenosis.  Heart Catheterization:  Assessment & Plan:   Restrictive lung disease  Chronic cough  Mild persistent reactive airway disease without complication  Discussion: Martin [name] is a 77 year old male, former smoker with atrial fibrillation, CKDIII, DMII, CAD, and hypertension who returns for chronic cough.   He has mild restrictive defect on PFTs with near significant bronchodilator response.   CT Chest shows patulous esophagus and bronchitis with inumerable centrilobular  nodules concerning for aspiration or bronchiolitis.   Asthma/Reactive airways disease Stable on Trelegy. No current cough or shortness of breath. Noted postnasal drip due to seasonal allergies. -Continue Trelegy as prescribed. -Use Flonase nasal spray prior to yard work to prevent postnasal drip.  Peripheral Edema Mild swelling in left leg. Patient has diuretic medication but uses it sparingly due to frequent urination side effect. -Continue current management. Patient to monitor and use diuretic as needed.  Follow-up Annual visit or sooner if any changes in breathing or cough. Patient to message if postnasal drip worsens and requires antibiotic treatment.  Melody Comas, MD [facility] Office: [phone]   Current Outpatient Medications:    aspirin 81 MG EC tablet, Take 1 tablet (81 mg total) by mouth daily., Disp: , Rfl:    azelastine (ASTELIN) 0.1 % nasal spray, Place 1 spray into both nostrils 2 (two) times daily., Disp: 30 mL, Rfl: 12   clopidogrel (PLAVIX) 75 MG tablet, Take 1 tablet (75 mg total) by mouth daily., Disp: 90 tablet, Rfl: 3   Continuous Blood Gluc Receiver (FREESTYLE LIBRE 2 READER) DEVI, See  admin instructions., Disp: , Rfl:    Continuous Glucose Sensor (FREESTYLE LIBRE 2 SENSOR) MISC, 1 Device by Does not apply route every 14 (fourteen) days., Disp: 6 each, Rfl: 3   escitalopram (LEXAPRO) 5 MG tablet, TAKE 1 TABLET(5 MG) BY MOUTH DAILY, Disp: 90 tablet, Rfl: 3   Evolocumab (REPATHA SURECLICK) 140 MG/ML SOAJ, Inject 140 mg into the skin every 14 (fourteen) days., Disp: 6 mL, Rfl: 3   ezetimibe (ZETIA) 10 MG tablet, TAKE 1 TABLET BY MOUTH EVERY DAY, Disp: 90 tablet, Rfl: 3   fluticasone (FLONASE) 50 MCG/ACT nasal spray, Place 2 sprays into both nostrils daily as needed for allergies or rhinitis., Disp: , Rfl:    Fluticasone-Umeclidin-Vilant (TRELEGY ELLIPTA) 100-62.5-25 MCG/ACT AEPB, INHALE 1 PUFF INTO THE LUNGS DAILY, Disp: 60 each, Rfl: 1   HUMALOG KWIKPEN 200 UNIT/ML KwikPen, ADMINISTER 15 TO 25 UNITS UNDER THE SKIN THREE TIMES DAILY BEFORE MEALS, Disp: 12 mL, Rfl: 3   hydrOXYzine (ATARAX) 25 MG tablet, Take 25 mg by mouth every 6 (six) hours as needed for anxiety., Disp: , Rfl:    insulin aspart (NOVOLOG FLEXPEN) 100 UNIT/ML FlexPen, Inject 20-25 Units into the skin 3 (three) times daily with meals. 3 times a day (just before each meal), 30-35-35 units, and pen needles 3/day, Disp: 75 mL, Rfl: 3   insulin glargine (LANTUS SOLOSTAR) 100 UNIT/ML Solostar Pen, Inject 35 Units into the skin at bedtime., Disp: 30 mL, Rfl: 3   Insulin Pen Needle (BD PEN NEEDLE NANO 2ND GEN) 32G X 4 MM MISC, USE AS INSTRUCTED TO ADMINISTER INSULIN 5X DAILY, Disp: 500 each, Rfl: 1   isosorbide mononitrate (IMDUR) 30 MG 24 hr tablet, TAKE 1 TABLET BY MOUTH EVERY DAY, Disp: 90 tablet, Rfl: 3   losartan (COZAAR) 50 MG tablet, TAKE 1 TABLET BY MOUTH EVERY MORNING AND AT BEDTIME., Disp: 180 tablet, Rfl: 3   metoprolol succinate (TOPROL XL) 25 MG 24 hr tablet, Take 0.5 tablets (12.5 mg total) by mouth daily., Disp: 90 tablet, Rfl: 3   montelukast (SINGULAIR) 10 MG tablet, Take 1 tablet (10 mg total) by mouth at  bedtime., Disp: 30 tablet, Rfl: 11   nitroGLYCERIN (NITROSTAT) 0.4 MG SL tablet, Place 1 tablet (0.4 mg total) under the tongue every 5 (five) minutes as needed for chest pain (if pain does not resolve with 2nd dose-  seek care)., Disp: 20 tablet, Rfl: 5   ondansetron (ZOFRAN-ODT) 4 MG disintegrating tablet, Take 1 tablet (4 mg total) by mouth every 8 (eight) hours as needed for up to 20 doses for nausea or vomiting., Disp: 20 tablet, Rfl: 0   ONETOUCH VERIO test strip, CHECK LEVELS 4 TIMES DAILY, Disp: 100 strip, Rfl: 2   Pitavastatin Calcium (LIVALO) 1 MG TABS, Take 1 tablet (1 mg total) by mouth daily., Disp: 90 tablet, Rfl: 3   torsemide (DEMADEX) 20 MG tablet, TAKE 1 TABLET BY MOUTH EVERY OTHER DAY FOR ANKLE SWELLING, Disp: 30 tablet, Rfl: 0

## 2023-03-05 NOTE — Patient Instructions (Addendum)
Continue Trelegy 1 puff daily - rinse mouth out after each use  We will give you samples today  Use flonase as needed prior to working out in the yard  Follow up in 1 year, call sooner if needed.

## 2023-03-18 ENCOUNTER — Other Ambulatory Visit: Payer: Self-pay | Admitting: Cardiovascular Disease

## 2023-03-18 DIAGNOSIS — H524 Presbyopia: Secondary | ICD-10-CM | POA: Diagnosis not present

## 2023-04-15 ENCOUNTER — Ambulatory Visit (INDEPENDENT_AMBULATORY_CARE_PROVIDER_SITE_OTHER): Payer: Medicare Other | Admitting: Family Medicine

## 2023-04-15 ENCOUNTER — Encounter: Payer: Self-pay | Admitting: Family Medicine

## 2023-04-15 VITALS — BP 120/70 | HR 87 | Temp 97.3°F | Ht 72.0 in | Wt 279.6 lb

## 2023-04-15 DIAGNOSIS — E1151 Type 2 diabetes mellitus with diabetic peripheral angiopathy without gangrene: Secondary | ICD-10-CM | POA: Diagnosis not present

## 2023-04-15 DIAGNOSIS — G720 Drug-induced myopathy: Secondary | ICD-10-CM

## 2023-04-15 DIAGNOSIS — I739 Peripheral vascular disease, unspecified: Secondary | ICD-10-CM | POA: Diagnosis not present

## 2023-04-15 DIAGNOSIS — R052 Subacute cough: Secondary | ICD-10-CM

## 2023-04-15 DIAGNOSIS — Z794 Long term (current) use of insulin: Secondary | ICD-10-CM

## 2023-04-15 DIAGNOSIS — E1169 Type 2 diabetes mellitus with other specified complication: Secondary | ICD-10-CM | POA: Diagnosis not present

## 2023-04-15 DIAGNOSIS — Z0000 Encounter for general adult medical examination without abnormal findings: Secondary | ICD-10-CM

## 2023-04-15 DIAGNOSIS — E785 Hyperlipidemia, unspecified: Secondary | ICD-10-CM

## 2023-04-15 DIAGNOSIS — Z23 Encounter for immunization: Secondary | ICD-10-CM

## 2023-04-15 DIAGNOSIS — N1832 Chronic kidney disease, stage 3b: Secondary | ICD-10-CM

## 2023-04-15 LAB — CBC WITH DIFFERENTIAL/PLATELET
Basophils Absolute: 0 10*3/uL (ref 0.0–0.1)
Basophils Relative: 0.2 % (ref 0.0–3.0)
Eosinophils Absolute: 0.2 10*3/uL (ref 0.0–0.7)
Eosinophils Relative: 2.7 % (ref 0.0–5.0)
HCT: 38.1 % — ABNORMAL LOW (ref 39.0–52.0)
Hemoglobin: 12.7 g/dL — ABNORMAL LOW (ref 13.0–17.0)
Lymphocytes Relative: 24 % (ref 12.0–46.0)
Lymphs Abs: 1.7 10*3/uL (ref 0.7–4.0)
MCHC: 33.4 g/dL (ref 30.0–36.0)
MCV: 92.9 fL (ref 78.0–100.0)
Monocytes Absolute: 0.6 10*3/uL (ref 0.1–1.0)
Monocytes Relative: 8 % (ref 3.0–12.0)
Neutro Abs: 4.5 10*3/uL (ref 1.4–7.7)
Neutrophils Relative %: 65.1 % (ref 43.0–77.0)
Platelets: 275 10*3/uL (ref 150.0–400.0)
RBC: 4.1 Mil/uL — ABNORMAL LOW (ref 4.22–5.81)
RDW: 14.8 % (ref 11.5–15.5)
WBC: 6.9 10*3/uL (ref 4.0–10.5)

## 2023-04-15 LAB — COMPREHENSIVE METABOLIC PANEL
ALT: 14 U/L (ref 0–53)
AST: 14 U/L (ref 0–37)
Albumin: 4.3 g/dL (ref 3.5–5.2)
Alkaline Phosphatase: 54 U/L (ref 39–117)
BUN: 23 mg/dL (ref 6–23)
CO2: 27 meq/L (ref 19–32)
Calcium: 8.8 mg/dL (ref 8.4–10.5)
Chloride: 103 meq/L (ref 96–112)
Creatinine, Ser: 1.44 mg/dL (ref 0.40–1.50)
GFR: 46.48 mL/min — ABNORMAL LOW (ref >60.00)
Glucose, Bld: 162 mg/dL — ABNORMAL HIGH (ref 70–99)
Potassium: 4.7 meq/L (ref 3.5–5.1)
Sodium: 137 meq/L (ref 135–145)
Total Bilirubin: 0.8 mg/dL (ref 0.2–1.2)
Total Protein: 7.2 g/dL (ref 6.0–8.3)

## 2023-04-15 LAB — MICROALBUMIN / CREATININE URINE RATIO
Creatinine,U: 199 mg/dL
Microalb Creat Ratio: 19.7 mg/g (ref 0.0–30.0)
Microalb, Ur: 39.3 mg/dL — ABNORMAL HIGH (ref 0.0–1.9)

## 2023-04-15 LAB — HEMOGLOBIN A1C: Hgb A1c MFr Bld: 8.9 % — ABNORMAL HIGH (ref 4.6–6.5)

## 2023-04-15 MED ORDER — PITAVASTATIN CALCIUM 1 MG PO TABS: 1.0000 | ORAL_TABLET | Freq: Every day | ORAL | 3 refills | Status: DC

## 2023-04-15 MED ORDER — AZELASTINE HCL 0.1 % NA SOLN: 1.0000 | Freq: Two times a day (BID) | NASAL | 12 refills | Status: AC

## 2023-04-15 NOTE — Progress Notes (Signed)
Phone: [phone]   Subjective:  Patient presents today for their annual physical. Chief complaint-noted.   See problem oriented charting- ROS- full  review of systems was completed and negative  except for:  left shoulder pain , mild ongoing shortness of breath- mildly worse with colds and has had recent one, fatigue, congestion, post nasal drip, sneezing, runny nose, mild sore throat, tinnitus, cough, joint pain, allergies causing many of these symptoms, numbness in feet  The following were reviewed and entered/updated in epic: Past Medical History:  Diagnosis Date   A-fib (HCC)    CKD (chronic kidney disease) stage 3, GFR 30-59 ml/min (HCC) 08/29/2020   Diabetes mellitus    Heart attack (HCC) 10/01/2018   Pt had open heart surgery   Hyperkalemia 08/29/2020   Hypertension    Osteomyelitis of fifth toe of left foot (HCC) 05/08/2020   Simple chronic bronchitis (HCC) 05/08/2020   Tobacco abuse    Patient Active Problem List   Diagnosis Date Noted   Osteomyelitis of fifth toe of left foot (HCC) 05/08/2020    Priority: High   History of atrial fibrillation- post op 10/24/2018    Priority: High   Coronary artery disease s/p CABG 10/01/2018 after STEMI     Priority: High   Undiagnosed cardiac murmurs 08/15/2015    Priority: High   Diabetes (HCC) 12/14/2009    Priority: High   GAD (generalized anxiety disorder) 08/09/2022    Priority: Medium    Drug-induced myopathy 06/18/2022    Priority: Medium    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    Priority: Medium    PAD (peripheral artery disease) (HCC) 08/20/2017    Priority: Medium    Partial tear of right Achilles tendon 02/12/2017    Priority: Medium    Aortic atherosclerosis (HCC) 10/07/2016    Priority: Medium    History of skin cancer 08/15/2015    Priority: Medium    Diabetic retinopathy (HCC) 02/21/2015    Priority: Medium    Hyperlipidemia associated with type 2 diabetes mellitus (HCC) 08/24/2014    Priority: Medium     Diabetic polyneuropathy (HCC) 04/11/2009    Priority: Medium    Essential hypertension 02/03/2007    Priority: Medium    Acute blood loss as cause of postoperative anemia 10/24/2018    Priority: Low   BPH (benign prostatic hyperplasia) 10/24/2018    Priority: Low   History of depression 10/24/2018    Priority: Low   S/P CABG x 4 10/01/2018    Priority: Low   Onychomycosis of toenail 02/17/2018    Priority: Low   Allergic rhinitis 08/20/2017    Priority: Low   C. difficile colitis 09/25/2016    Priority: Low   History of aspiration pneumonia 09/25/2016    Priority: Low   Former smoker 08/24/2014    Priority: Low   Ingrowing toenail 07/19/2014    Priority: Low   BACK PAIN, LUMBAR, WITH RADICULOPATHY 08/30/2008    Priority: Low   Diabetic ulcer of toe of right foot associated with type 2 diabetes mellitus (HCC) 08/22/2021   Simple chronic bronchitis (HCC) 05/08/2020   Past Surgical History:  Procedure Laterality Date   CORONARY ARTERY BYPASS GRAFT N/A 10/01/2018   Procedure: CORONARY ARTERY BYPASS GRAFTING (CABG) TIMES  FOUR USING LEFT MAMMARY ARTERY AND RIGHT GREATER SAPHEANOUS VEIN HARVESTED ENDOSCOPICALLY;  Surgeon: Loreli Slot, MD;  Location: [facility] OR;  Service: Open Heart Surgery;  Laterality: N/A;   HERNIA REPAIR     >10 years  ago   IR ANGIOGRAM EXTREMITY LEFT  06/16/2020   IR RADIOLOGIST EVAL & MGMT  12/24/2016   IR RADIOLOGIST EVAL & MGMT  05/30/2020   IR RADIOLOGIST EVAL & MGMT  07/13/2020   IR RADIOLOGIST EVAL & MGMT  09/06/2020   IR RADIOLOGIST EVAL & MGMT  12/26/2020   IR THORACENTESIS ASP PLEURAL SPACE W/IMG GUIDE  10/06/2018   IR US GUIDE VASC ACCESS RIGHT  06/16/2020   LEFT HEART CATH AND CORONARY ANGIOGRAPHY N/A 09/29/2018   Procedure: LEFT HEART CATH AND CORONARY ANGIOGRAPHY;  Surgeon: Lennette Bihari, MD;  Location: [facility];  Service: Cardiovascular;  Laterality: N/A;   open heart surfery     right shoulder surgery     around 2014   TEE WITHOUT  CARDIOVERSION N/A 10/01/2018   Procedure: TRANSESOPHAGEAL ECHOCARDIOGRAM (TEE);  Surgeon: Loreli Slot, MD;  Location: [facility] OR;  Service: Open Heart Surgery;  Laterality: N/A;    Family History  Problem Relation Age of Onset   Hypertension Mother    Heart disease Mother        CHF did not see doctor   Diabetes Maternal Grandmother    Diabetes Son     Medications- reviewed and updated Current Outpatient Medications  Medication Sig Dispense Refill   aspirin 81 MG EC tablet Take 1 tablet (81 mg total) by mouth daily.     azelastine (ASTELIN) 0.1 % nasal spray Place 1 spray into both nostrils 2 (two) times daily. 30 mL 12   clopidogrel (PLAVIX) 75 MG tablet Take 1 tablet (75 mg total) by mouth daily. 90 tablet 3   Continuous Blood Gluc Receiver (FREESTYLE LIBRE 2 READER) DEVI See admin instructions.     Continuous Glucose Sensor (FREESTYLE LIBRE 2 SENSOR) MISC 1 Device by Does not apply route every 14 (fourteen) days. 6 each 3   escitalopram (LEXAPRO) 5 MG tablet TAKE 1 TABLET(5 MG) BY MOUTH DAILY 90 tablet 3   Evolocumab (REPATHA SURECLICK) 140 MG/ML SOAJ Inject 140 mg into the skin every 14 (fourteen) days. 6 mL 3   ezetimibe (ZETIA) 10 MG tablet TAKE 1 TABLET BY MOUTH EVERY DAY 90 tablet 3   fluticasone (FLONASE) 50 MCG/ACT nasal spray Place 2 sprays into both nostrils daily as needed for allergies or rhinitis.     Fluticasone-Umeclidin-Vilant (TRELEGY ELLIPTA) 100-62.5-25 MCG/ACT AEPB INHALE 1 PUFF INTO THE LUNGS DAILY 60 each 1   HUMALOG KWIKPEN 200 UNIT/ML KwikPen ADMINISTER 15 TO 25 UNITS UNDER THE SKIN THREE TIMES DAILY BEFORE MEALS 12 mL 3   hydrOXYzine (ATARAX) 25 MG tablet Take 25 mg by mouth every 6 (six) hours as needed for anxiety.     insulin aspart (NOVOLOG FLEXPEN) 100 UNIT/ML FlexPen Inject 20-25 Units into the skin 3 (three) times daily with meals. 3 times a day (just before each meal), 30-35-35 units, and pen needles 3/day 75 mL 3   insulin glargine (LANTUS SOLOSTAR)  100 UNIT/ML Solostar Pen Inject 35 Units into the skin at bedtime. 30 mL 3   Insulin Pen Needle (BD PEN NEEDLE NANO 2ND GEN) 32G X 4 MM MISC USE AS INSTRUCTED TO ADMINISTER INSULIN 5X DAILY 500 each 1   isosorbide mononitrate (IMDUR) 30 MG 24 hr tablet TAKE 1 TABLET BY MOUTH EVERY DAY 90 tablet 3   losartan (COZAAR) 50 MG tablet TAKE 1 TABLET BY MOUTH EVERY MORNING AND AT BEDTIME. 180 tablet 3   metoprolol succinate (TOPROL XL) 25 MG 24 hr tablet Take 0.5  tablets (12.5 mg total) by mouth daily. 90 tablet 3   montelukast (SINGULAIR) 10 MG tablet Take 1 tablet (10 mg total) by mouth at bedtime. 30 tablet 11   nitroGLYCERIN (NITROSTAT) 0.4 MG SL tablet Place 1 tablet (0.4 mg total) under the tongue every 5 (five) minutes as needed for chest pain (if pain does not resolve with 2nd dose- seek care). 20 tablet 5   ONETOUCH VERIO test strip CHECK LEVELS 4 TIMES DAILY 100 strip 2   Pitavastatin Calcium (LIVALO) 1 MG TABS Take 1 tablet (1 mg total) by mouth daily. 90 tablet 3   torsemide (DEMADEX) 20 MG tablet TAKE 1 TABLET BY MOUTH EVERY OTHER DAY FOR ANKLE SWELLING 45 tablet 1   ondansetron (ZOFRAN-ODT) 4 MG disintegrating tablet Take 1 tablet (4 mg total) by mouth every 8 (eight) hours as needed for up to 20 doses for nausea or vomiting. (Patient not taking: Reported on 04/15/2023) 20 tablet 0   No current facility-administered medications for this visit.    Allergies-reviewed and updated Allergies  Allergen Reactions   Jardiance [Empagliflozin] Nausea And Vomiting   Simvastatin Diarrhea   Statins Other (See Comments)    achey joints   Xanax [Alprazolam] Anxiety    Social History   Social History Narrative   Married. 3 kids. 7 grandkids. No greatgrandkids.       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 Oropharynx normal Neck: no thyromegaly CV: RRR 3/6 SEM LUSB Lungs: crackles bilateral lung bases Abdomen: soft/nontender/nondistended/normal bowel sounds. No rebound or guarding.  Ext: trace edema Skin: warm, dry Neuro: grossly normal, moves all extremities, PERRLA   Assessment and Plan  77 y.o. male presenting for annual physical.  Health Maintenance counseling: 1. Anticipatory guidance: Patient counseled regarding regular dental exams - advised q6 months- has been over a year, eye exams -yearly,  avoiding smoking and second hand smoke , limiting alcohol to 2 beverages per day - rare social, no illicit drugs .   2. Risk factor reduction:  Advised patient of need for regular exercise and diet rich and fruits and vegetables to reduce risk of heart attack and stroke.  Exercise- has aerodyne-but needs to bring it out  - considering an aging backwards exercise program.  Diet/weight management-stable from last physical  Wt Readings from Last 3 Encounters:  04/15/23 279 lb 9.6 oz (126.8 kg)  03/05/23 279 lb (126.6 kg)  01/17/23 271 lb (122.9 kg)  3. Immunizations/screenings/ancillary studies-flu shot today, declines Shingrix- has had zostavax, declines COVID-19 vaccination, consider Prevnar 20 future visit when not having cold  Immunization History  Administered Date(s) Administered   Fluad Quad(high Dose 65+) 01/06/2019, 02/09/2020, 01/25/2021   Fluad Trivalent(High Dose 65+) 04/15/2023   Influenza Split 02/06/2011, 01/28/2012   Influenza Whole 02/08/2010   Influenza, High Dose Seasonal PF 02/14/2016, 02/12/2017, 02/17/2018   Influenza,inj,Quad PF,6+ Mos 02/21/2015   Moderna Sars-Covid-2 Vaccination 05/30/2019, 06/27/2019, 04/13/2020   Pneumococcal Conjugate-13 08/24/2014   Pneumococcal Polysaccharide-23 07/18/2008, 08/15/2015   Td 04/29/2004   Tdap 08/24/2014   Zoster, Live  02/08/2010  4. Prostate cancer screening-  past age based  screening recommendations past age 11.  No new urinary symptoms   5. Colon cancer screening -  past age based screening recommendations .  We have attempted Cologuard in the past but he was told it was not a good sample.  No blood in the stool or melena  6. Skin cancer screening-follows with skin surgery center- about a year. advised regular sunscreen use. Has a few spots he will have dermatology look like  7. Smoking associated screening (lung cancer screening, AAA screen 65-75, UA)- former smoker-quit in 2004 with 1.5 pack years.  No regular screening required  8. STD screening -widowed/not sexually active  Status of chronic or acute concerns   #social update- lost wife ruth on 02/24/22- mets to brain from breast cancer- died in [place]- feels he is starting to do better   # Anxiety S: Medication:Lexapro 5 mg. Has done very well lately- only one episode driving through [place] -Started after losing his wife in October 2023.  Has not been able to connect with a therapist A/P: doing very well- continue current medications   #Chronic cough since November 2021- seeing Dr. Francine Graven pulm since 2023-also listed as restrictive lung disease -Normal chest x-ray 06/30/2020 -CT high resolution 05/31/22- concern for aspiration, patulous esophagus possible esophageal dysmotility -Improvement with treatment for sinusitis in the past with Augmentin  -added april 2022 Astelin-previously had taking Claritin but felt lightheaded in the day and stopped, Mucinex, Flonase -worsens with mowing grass- big flare each time  -Originally held off on prednisone due to patient's treatment for osteomyelitis with PICC line  -Trelegy helpful noted February 2024 but not as sure at present- ongoing cough and congestion issues for last 3 weeks and post nasal drip - he wants to monitor current symptoms    # Diabetes-managed by endocrinology Dr. Elvera Lennox (prior Dr.  Everardo All) -History of diabetic retinopathy  with shots every 8 weeks -History of diabetic polyneuropathy which contributed to diabetic wound/ulcer/osteomyelitis S: Medication:Lantus and NovoLog.   -jardiance caused nausea and vomiting per notes Lab Results  Component Value Date   HGBA1C 7.9 (A) 07/22/2022   HGBA1C 8.9 (A) 03/18/2022   HGBA1C 8.3 (A) 08/20/2021  A/P: has visit in a few days with Dr. Elvera Lennox- we will check a1c today with labs  #CAD-no chest pain. Some shortness of breath seems more pulmonary  #Peripheral arterial disease- history of angioplasty in February 2022.  Statin myalgias #hyperlipidemia-with LDL goal under 70 particular with aortic atherosclerosis S: Medication:Aspirin 81 mg, Plavix 75 mg (interventional radiology has recommended continuing aspirin and Plavix), Imdur 30 mg -Zetia 10 mg, Livalo 1 mg -waiting on approval for repatha in 2023 through cardiology-trying to start Repatha next year- ran into donut whole issues Lab Results  Component Value Date   CHOL 136 01/17/2023   HDL 46 01/17/2023   LDLCALC 74 01/17/2023   LDLDIRECT 173.0 07/01/2018   TRIG 80 01/17/2023   CHOLHDL 3.0 01/17/2023   A/P: coronary artery disease largely asymptomatic unless shortness of breath is cardiac related- may be pulmonary or deconditioning though Peripheral arterial disease- denies claudication- continue current medications  Cholesterol close to goal- will be even better on Repatha- toon son for repeat   #hypertension S: medication: Losartan 50 mg twice daily, imdur 30mg , torsemide 20 mg-cardiology has recommended every other day- just taking as needed -he stopped metoprolol thinking it caused anxiety in 2024 but later did low dose 12.5 mg ER  but was getting palpitations  BP Readings from Last 3 Encounters:  04/15/23 120/70  03/05/23 130/70  01/17/23 (!) 160/80  A/P: blood pressure continue current medications   #Iliac artery aneurysm- on repeat ultrasound 2021 noted to be  ectatic only-consider 3 to 5-year repeat- wants to push out  #Left shoulder- mild issues- thinks may be rotator cuff- wants to monitor.   Recommended follow up: Return in about 6 months (around 10/14/2023) for followup or sooner if needed.Schedule b4 you leave. Future Appointments  Date Time Provider Department Center  04/16/2023  3:30 PM [facility] ANNUAL WELLNESS VISIT 1 [facility]  04/21/2023  8:20 AM Carlus Pavlov, MD [facility] None  05/19/2023  8:00 AM Cleaver, Thomasene Ripple, NP [facility] None   Lab/Order associations: fasting other than some juice for low sugar   ICD-10-CM   1. Preventative health care  Z00.00     2. PAD (peripheral artery disease) (HCC)  I73.9     3. Hyperlipidemia associated with type 2 diabetes mellitus (HCC)  E11.69    E78.5     4. Need for influenza vaccination  Z23 Flu Vaccine Trivalent High Dose (Fluad)    5. Type 2 diabetes mellitus with diabetic peripheral angiopathy without gangrene, with long-term current use of insulin (HCC)  E11.51    Z79.4     6. Simple chronic bronchitis (HCC) Chronic J41.0     7. Stage 3b chronic kidney disease (HCC)  N18.32     8. Drug-induced myopathy  G72.0       No orders of the defined types were placed in this encounter.   Return precautions advised.  Tana Conch, MD

## 2023-04-15 NOTE — Patient Instructions (Addendum)
Sign release of information at the check out desk for diabetic eye exam.  Please stop by lab before you go If you have mychart- we will send your results within 3 business days of [place] receiving them.  If you do not have mychart- we will call you about results within 5 business days of [place] receiving them.  *please also note that you will see labs on mychart as soon as they post. I will later go in and write notes on them- will say "notes from Dr. Durene Cal"   Ordered x-ray through [facility].  Their phone number is [phone].  Please call them if you don't hear in a day or two about x-ray  Recommended follow up: Return in about 6 months (around 10/14/2023) for followup or sooner if needed.Schedule b4 you leave.

## 2023-04-16 NOTE — Progress Notes (Signed)
This encounter was created in error - please disregard. Pt declined appt at this time

## 2023-04-21 ENCOUNTER — Ambulatory Visit: Payer: Medicare Other | Admitting: Internal Medicine

## 2023-04-21 ENCOUNTER — Encounter: Payer: Self-pay | Admitting: Internal Medicine

## 2023-04-21 VITALS — BP 140/76 | HR 86 | Ht 72.0 in | Wt 281.4 lb

## 2023-04-21 DIAGNOSIS — E1169 Type 2 diabetes mellitus with other specified complication: Secondary | ICD-10-CM | POA: Diagnosis not present

## 2023-04-21 DIAGNOSIS — E1151 Type 2 diabetes mellitus with diabetic peripheral angiopathy without gangrene: Secondary | ICD-10-CM

## 2023-04-21 DIAGNOSIS — E785 Hyperlipidemia, unspecified: Secondary | ICD-10-CM

## 2023-04-21 DIAGNOSIS — Z794 Long term (current) use of insulin: Secondary | ICD-10-CM | POA: Diagnosis not present

## 2023-04-21 MED ORDER — INSULIN PEN NEEDLE 32G X 4 MM MISC
3 refills | Status: DC
Start: 2023-04-21 — End: 2023-06-13

## 2023-04-21 MED ORDER — FREESTYLE LIBRE 3 PLUS SENSOR MISC: 1.0000 | 3 refills | Status: DC

## 2023-04-21 NOTE — Patient Instructions (Addendum)
Please continue: - Lantus 40 units at bedtime  Use: - Humalog U200 20-28 units 15 min before the 3 meals  May need to bolus 4-5 units before coffee.  Please return in 3-4 months.

## 2023-04-21 NOTE — Progress Notes (Signed)
Patient ID: Martin Ponto Sr., male   DOB: 1945/07/02, 77 y.o.   MRN: [id number]  HPI: Martin METRICK Sr. is a 77 y.o.-year-old male, returning for follow-up for DM2, dx in 2004, insulin-dependent since 2013, uncontrolled, with complications (CAD - s/p AMI, s/p CABG 2020, PAD - s/p angioplasty, A fib, CKD, DR, PN). Pt. previously saw Dr. Everardo All, but last visit with me 4 months ago.  Interim history: His wife died last year (03-Mar-2022).  He was grieving and under a lot of stress.  Now feeling better on escitalopram. No increased urination, chest pain.  He has some SOB. He had a URI last month. He really asked his diet and gained some weight recently.  Sugars are also higher.  Reviewed HbA1c: Lab Results  Component Value Date   HGBA1C 8.9 (H) 04/15/2023   HGBA1C 7.9 (A) 07/22/2022   HGBA1C 8.9 (A) 03/18/2022   HGBA1C 8.3 (A) 08/20/2021   HGBA1C 8.1 (A) 06/04/2021   HGBA1C 9.1 (A) 12/18/2020   HGBA1C 8.1 (H) 08/16/2020   HGBA1C 8.6 (H) 02/09/2020   HGBA1C 7.6 (H) 07/08/2019   HGBA1C 8.2 (H) 01/06/2019   Previously on: - Novolog 30 units 3x a day with meals, including in the middle of the night He was on Lantus before, sugars were better controlled then.  Currently on: - Lantus 20 >> 25 >> 30 >>  30-35 >> 40 units at bedtime - Novolog 20-25 >> 15-20 >> 24 units 15 min before the 3 meals - Humalog He was previously on Jardiance but developed abdominal pain/nausea and vomiting-had to go to the hospital by ambulance and mL of the night in 03/2022.  He would not want to restart this.  Pt checks his sugars >4x a day and they are:  Previously:  Previously:   Lowest sugar was >100 >> 70 >> 100; he has hypoglycemia awareness at 100.  Highest sugar was 200s >> 300s >> 200s >> upper 200s.  Glucometer: One Touch Verio  - + CKD, last BUN/creatinine:  Lab Results  Component Value Date   BUN 23 04/15/2023   BUN 19 01/17/2023   CREATININE 1.44 04/15/2023   CREATININE 1.30 (H)  01/17/2023   Lab Results  Component Value Date   MICRALBCREAT 19.7 04/15/2023   MICRALBCREAT 39.4 (H) 12/18/2021   MICRALBCREAT 8.2 01/06/2019   MICRALBCREAT 20.1 08/08/2015   MICRALBCREAT 4.5 04/05/2014   MICRALBCREAT 5.3 01/11/2014   MICRALBCREAT 2.0 10/04/2009   MICRALBCREAT 9.5 04/03/2009  On Cozaar 50 mg daily.  - + HL; last set of lipids: Lab Results  Component Value Date   CHOL 136 01/17/2023   HDL 46 01/17/2023   LDLCALC 74 01/17/2023   LDLDIRECT 173.0 07/01/2018   TRIG 80 01/17/2023   CHOLHDL 3.0 01/17/2023  On Livalo 1 mg daily, Zetia 10 mg daily.  Also now on Repatha.  - last eye exam was on 11/27/2022.  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  He previously had no DR. He has IO inj.  - + numbness and tingling in his feet.  Last foot exam was on 07/22/2022. He has history of osteomyelitis of his left toe, but this improved after his angioplasty procedure.  ROS: + see HPI  Past Medical History:  Diagnosis Date   A-fib (HCC)    CKD (chronic kidney disease) stage 3, GFR 30-59 ml/min (HCC) 08/29/2020   Diabetes mellitus    Heart attack (HCC) 10/01/2018   Pt had open heart surgery   Hyperkalemia  08/29/2020   Hypertension    Osteomyelitis of fifth toe of left foot (HCC) 05/08/2020   Simple chronic bronchitis (HCC) 05/08/2020   Tobacco abuse    Past Surgical History:  Procedure Laterality Date   CORONARY ARTERY BYPASS GRAFT N/A 10/01/2018   Procedure: CORONARY ARTERY BYPASS GRAFTING (CABG) TIMES  FOUR USING LEFT MAMMARY ARTERY AND RIGHT GREATER SAPHEANOUS VEIN HARVESTED ENDOSCOPICALLY;  Surgeon: Loreli Slot, MD;  Location: [facility] OR;  Service: Open Heart Surgery;  Laterality: N/A;   HERNIA REPAIR     >10 years ago   IR ANGIOGRAM EXTREMITY LEFT  06/16/2020   IR RADIOLOGIST EVAL & MGMT  12/24/2016   IR RADIOLOGIST EVAL & MGMT  05/30/2020   IR RADIOLOGIST EVAL & MGMT  07/13/2020   IR RADIOLOGIST EVAL & MGMT  09/06/2020   IR RADIOLOGIST EVAL & MGMT  12/26/2020    IR THORACENTESIS ASP PLEURAL SPACE W/IMG GUIDE  10/06/2018   IR US GUIDE VASC ACCESS RIGHT  06/16/2020   LEFT HEART CATH AND CORONARY ANGIOGRAPHY N/A 09/29/2018   Procedure: LEFT HEART CATH AND CORONARY ANGIOGRAPHY;  Surgeon: Lennette Bihari, MD;  Location: [facility] IN[facility]IVE [facility];  Service: Cardiovascular;  Laterality: N/A;   open heart surfery     right shoulder surgery     around 2014   TEE WITHOUT CARDIOVERSION N/A 10/01/2018   Procedure: TRANSESOPHAGEAL ECHOCARDIOGRAM (TEE);  Surgeon: Loreli Slot, MD;  Location: [facility] OR;  Service: Open Heart Surgery;  Laterality: N/A;   Social History   Socioeconomic History   Marital status: Married    Spouse name: Not on file   Number of children: Not on file   Years of education: Not on file   Highest education level: Not on file  Occupational History   Not on file  Tobacco Use   Smoking status: Former    Current packs/day: 0.00    Average packs/day: 0.8 packs/day for 2.0 years (1.5 ttl pk-yrs)    Types: Cigarettes    Start date: 2002    Quit date: 04/29/2002    Years since quitting: 20.9   Smokeless tobacco: Never   Tobacco comments:    smoked for 10 years on and off  Substance and Sexual Activity   Alcohol use: Yes    Comment: occasionally   Drug use: No   Sexual activity: Yes  Other Topics Concern   Not on file  Social History Narrative   Married. 3 kids. 7 grandkids. No greatgrandkids.       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      Hobbies: golf previously, yardwork   Social Drivers of Corporate investment banker Strain: Not on [employer] Insecurity: No Food Insecurity (08/05/2022)   Hunger Vital Sign    Worried About Running Out of Food in the Last Year: Never true    Ran Out of Food in the Last Year: Never true  Transportation Needs: No Transportation Needs (08/05/2022)   PRAPARE - Administrator, Civil Service (Medical): No    Lack of Transportation (Non-Medical): No  Physical  Activity: Not on file  Stress: Not on file  Social Connections: Unknown (08/09/2022)   Received from [facility]   Social Network    Social Network: Not on file  Intimate Partner Violence: Unknown (08/09/2022)   Received from [facility]   HITS    Physically Hurt: Not on file    Insult  or Talk Down To: Not on file    Threaten Physical Harm: Not on file    Scream or Curse: Not on file   Current Outpatient Medications on File Prior to Visit  Medication Sig Dispense Refill   aspirin 81 MG EC tablet Take 1 tablet (81 mg total) by mouth daily.     azelastine (ASTELIN) 0.1 % nasal spray Place 1 spray into both nostrils 2 (two) times daily. 30 mL 12   clopidogrel (PLAVIX) 75 MG tablet Take 1 tablet (75 mg total) by mouth daily. 90 tablet 3   Continuous Blood Gluc Receiver (FREESTYLE LIBRE 2 READER) DEVI See admin instructions.     Continuous Glucose Sensor (FREESTYLE LIBRE 2 SENSOR) MISC 1 Device by Does not apply route every 14 (fourteen) days. 6 each 3   escitalopram (LEXAPRO) 5 MG tablet TAKE 1 TABLET(5 MG) BY MOUTH DAILY 90 tablet 3   Evolocumab (REPATHA SURECLICK) 140 MG/ML SOAJ Inject 140 mg into the skin every 14 (fourteen) days. 6 mL 3   ezetimibe (ZETIA) 10 MG tablet TAKE 1 TABLET BY MOUTH EVERY DAY 90 tablet 3   fluticasone (FLONASE) 50 MCG/ACT nasal spray Place 2 sprays into both nostrils daily as needed for allergies or rhinitis.     Fluticasone-Umeclidin-Vilant (TRELEGY ELLIPTA) 100-62.5-25 MCG/ACT AEPB INHALE 1 PUFF INTO THE LUNGS DAILY 60 each 1   HUMALOG KWIKPEN 200 UNIT/ML KwikPen ADMINISTER 15 TO 25 UNITS UNDER THE SKIN THREE TIMES DAILY BEFORE MEALS 12 mL 3   hydrOXYzine (ATARAX) 25 MG tablet Take 25 mg by mouth every 6 (six) hours as needed for anxiety.     insulin aspart (NOVOLOG FLEXPEN) 100 UNIT/ML FlexPen Inject 20-25 Units into the skin 3 (three) times daily with meals. 3 times a day (just before each meal), 30-35-35 units, and pen needles  3/day 75 mL 3   insulin glargine (LANTUS SOLOSTAR) 100 UNIT/ML Solostar Pen Inject 35 Units into the skin at bedtime. 30 mL 3   Insulin Pen Needle (BD PEN NEEDLE NANO 2ND GEN) 32G X 4 MM MISC USE AS INSTRUCTED TO ADMINISTER INSULIN 5X DAILY 500 each 1   isosorbide mononitrate (IMDUR) 30 MG 24 hr tablet TAKE 1 TABLET BY MOUTH EVERY DAY 90 tablet 3   losartan (COZAAR) 50 MG tablet TAKE 1 TABLET BY MOUTH EVERY MORNING AND AT BEDTIME. 180 tablet 3   metoprolol succinate (TOPROL XL) 25 MG 24 hr tablet Take 0.5 tablets (12.5 mg total) by mouth daily. 90 tablet 3   montelukast (SINGULAIR) 10 MG tablet Take 1 tablet (10 mg total) by mouth at bedtime. 30 tablet 11   nitroGLYCERIN (NITROSTAT) 0.4 MG SL tablet Place 1 tablet (0.4 mg total) under the tongue every 5 (five) minutes as needed for chest pain (if pain does not resolve with 2nd dose- seek care). 20 tablet 5   ondansetron (ZOFRAN-ODT) 4 MG disintegrating tablet Take 1 tablet (4 mg total) by mouth every 8 (eight) hours as needed for up to 20 doses for nausea or vomiting. (Patient not taking: Reported on 04/15/2023) 20 tablet 0   ONETOUCH VERIO test strip CHECK LEVELS 4 TIMES DAILY 100 strip 2   Pitavastatin Calcium (LIVALO) 1 MG TABS Take 1 tablet (1 mg total) by mouth daily. 90 tablet 3   torsemide (DEMADEX) 20 MG tablet TAKE 1 TABLET BY MOUTH EVERY OTHER DAY FOR ANKLE SWELLING 45 tablet 1   [DISCONTINUED] DAPTOmycin (CUBICIN) 500 MG injection Inject into the vein. (Patient not taking: Reported on  10/22/2020)     [DISCONTINUED] DAPTOmycin 350 MG SOLR Inject into the vein. (Patient not taking: No sig reported)     No current facility-administered medications on file prior to visit.   Allergies  Allergen Reactions   Jardiance [Empagliflozin] Nausea And Vomiting   Simvastatin Diarrhea   Statins Other (See Comments)    achey joints   Xanax [Alprazolam] Anxiety   Family History  Problem Relation Age of Onset   Hypertension Mother    Heart disease  Mother        CHF did not see doctor   Diabetes Maternal Grandmother    Diabetes Son    PE: BP (!) 140/76   Pulse 86   Ht 6' (1.829 m)   Wt 281 lb 6.4 oz (127.6 kg)   SpO2 92%   BMI 38.16 kg/m  Wt Readings from Last 3 Encounters:  04/21/23 281 lb 6.4 oz (127.6 kg)  04/15/23 279 lb 9.6 oz (126.8 kg)  03/05/23 279 lb (126.6 kg)   Constitutional: overweight, in NAD Eyes:  EOMI, no exophthalmos ENT: no neck masses, no cervical lymphadenopathy Cardiovascular: RRR, No MRG Respiratory: CTA B in upper lungs but B lower rhonchi Musculoskeletal: no deformities Skin:no rashes Neurological: no tremor with outstretched hands  ASSESSMENT: 1. DM2, insulin-dependent, uncontrolled, with complications - CAD - s/p AMI, s/p CABG - PAD - s/p angioplasty 02.2022 - A fib - CKD - DR - PN; h/o osteomyelitis L 5th toe  2. HL  PLAN:  1. Patient with longstanding, uncontrolled, type 2 diabetes, on basal-bolus insulin regimen, with the Lantus dose increased at last visit as sugars were worse in the previous 3 weeks due to poor diet.  He was not continue and eating many snacks.  We discussed about the need to improve the meals as sugars appears to be elevated at all times of the day.  He was occasionally forgetting to take it at night and was taking it in the middle of the night.  I advised him to try to take it after dinner, and is still forgetting it, to move into the morning.  We also discussed about varying the dose of mealtime insulin depending on the size and consistency of the meal.  I advised him that he may need to take 4 to 5 units of insulin before coffee.   -He had  N/V/AP  - attributed to [place].  Reviewing his chart, glucose was mostly in the 300s with a CO2 of 22, at the lower limit of the target range. My suggestion was to hold off restarting an SGLT2 inhibitor at that time and he wholeheartedly agreed. -At last visit, HbA1c was better, however, he had another HbA1c obtained earlier  this month and this was higher, at 8.9%. CGM interpretation: -At today's visit, we reviewed his CGM downloads: It appears that 28% of values are in target range (goal >70%), while 72% are higher than 180 (goal <25%), and 0% are lower than 70 (goal <4%).  The calculated average blood sugar is 215.  The projected HbA1c for the next 3 months (GMI) is 8.5%. -Reviewing the CGM trends, sugars are high at all times of the day, but increasing more after lunch.  Upon questioning, he relaxed his diet since last visit.  He is planning to improve this after the holidays.  We also discussed about the need for exercise as he feels deconditioned.  His sugars were better in the last 2 days and upon questioning, he just increased the dose  of Lantus to 40 units daily.  Upon questioning, he is taking this in the morning, which we will continue.  I also advised him to continue with a higher dose.  He is not varying the dose of Humalog with his meals and I strongly advised him to start doing so.  I advised him that he could go even higher for holiday meals or if having dessert after meals. - I suggested to:  Patient Instructions  Please continue: - Lantus 40 units daily  Use: - Humalog U200 20-28 units 15 min before the 3 meals  May need to bolus 4-5 units before coffee.  Please return in 3-4 months.   - advised to check sugars at different times of the day - 4x a day, rotating check times - advised for yearly eye exams >> he is UTD - return to clinic in 3-4 months  2. HL Latest lipid panel was reviewed from 12/2022: LDL above target but much improved in the last 5 years: Lab Results  Component Value Date   CHOL 136 01/17/2023   HDL 46 01/17/2023   LDLCALC 74 01/17/2023   LDLDIRECT 173.0 07/01/2018   TRIG 80 01/17/2023   CHOLHDL 3.0 01/17/2023  -He is on Livalo 1 mg daily and Zetia 10 mg daily without side effects.  Of note, he has a very high LP(a) (01/17/2023): 138.4.  He was started on Repatha by  cardiology.  Carlus Pavlov, MD PhD [facility]

## 2023-04-29 DIAGNOSIS — K080 Exfoliation of teeth due to systemic causes: Secondary | ICD-10-CM | POA: Diagnosis not present

## 2023-05-13 ENCOUNTER — Other Ambulatory Visit (HOSPITAL_COMMUNITY): Payer: Self-pay

## 2023-05-13 ENCOUNTER — Telehealth: Payer: Self-pay

## 2023-05-13 NOTE — Telephone Encounter (Signed)
 Pharmacy Patient Advocate Encounter   Received notification from Patient Pharmacy that prior authorization for Livalo  1MG  tablets is required/requested.   Insurance verification completed.   The patient is insured through [employer]  .   Per test claim: PA Paused

## 2023-05-13 NOTE — Telephone Encounter (Signed)
 See below, this medication was not mentioned in your note at the ov on 04/15/23.

## 2023-05-14 ENCOUNTER — Other Ambulatory Visit (HOSPITAL_COMMUNITY): Payer: Self-pay

## 2023-05-14 DIAGNOSIS — K080 Exfoliation of teeth due to systemic causes: Secondary | ICD-10-CM | POA: Diagnosis not present

## 2023-05-14 NOTE — Telephone Encounter (Signed)
 Clinical questions answered and PA submitted   Key: [id number]

## 2023-05-14 NOTE — Telephone Encounter (Signed)
 Pt needs PA for Livalo  1mg  please

## 2023-05-15 ENCOUNTER — Other Ambulatory Visit (HOSPITAL_COMMUNITY): Payer: Self-pay

## 2023-05-15 NOTE — Progress Notes (Deleted)
Cardiology Clinic Note   Patient Name: Martin [name]. Date of Encounter: 05/15/2023  Primary Care Provider:  Shelva Majestic, MD Primary Cardiologist:  Nicki Guadalajara, MD  Patient Profile    Martin [place] [name]. 78 year old male presents the clinic today for follow-up evaluation of his coronary artery disease, hyperlipidemia, and hypertension.  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 (HCC) 05/08/2020   Simple chronic bronchitis (HCC) 05/08/2020   Tobacco abuse    Past Surgical History:  Procedure Laterality Date   CORONARY ARTERY BYPASS GRAFT N/A 10/01/2018   Procedure: CORONARY ARTERY BYPASS GRAFTING (CABG) TIMES  FOUR USING LEFT MAMMARY ARTERY AND RIGHT GREATER SAPHEANOUS VEIN HARVESTED ENDOSCOPICALLY;  Surgeon: Loreli Slot, MD;  Location: [facility] OR;  Service: Open Heart Surgery;  Laterality: N/A;   HERNIA REPAIR     >10 years ago   IR ANGIOGRAM EXTREMITY LEFT  06/16/2020   IR RADIOLOGIST EVAL & MGMT  12/24/2016   IR RADIOLOGIST EVAL & MGMT  05/30/2020   IR RADIOLOGIST EVAL & MGMT  07/13/2020   IR RADIOLOGIST EVAL & MGMT  09/06/2020   IR RADIOLOGIST EVAL & MGMT  12/26/2020   IR THORACENTESIS ASP PLEURAL SPACE W/IMG GUIDE  10/06/2018   IR US GUIDE VASC ACCESS RIGHT  06/16/2020   LEFT HEART CATH AND CORONARY ANGIOGRAPHY N/A 09/29/2018   Procedure: LEFT HEART CATH AND CORONARY ANGIOGRAPHY;  Surgeon: Lennette Bihari, MD;  Location: [facility] INVASIVE CV LAB;  Service: Cardiovascular;  Laterality: N/A;   open heart surfery     right shoulder surgery     around 2014   TEE WITHOUT CARDIOVERSION N/A 10/01/2018   Procedure: TRANSESOPHAGEAL ECHOCARDIOGRAM (TEE);  Surgeon: Loreli Slot, MD;  Location: [facility] OR;   Service: Open Heart Surgery;  Laterality: N/A;    Allergies  Allergies  Allergen Reactions   Jardiance [Empagliflozin] Nausea And Vomiting   Simvastatin Diarrhea   Statins Other (See Comments)    achey joints   Xanax [Alprazolam] Anxiety    History of Present Illness    Martin [place] [name]. has a PMH of coronary artery disease, hyperlipidemia, PAD, HTN, aortic atherosclerosis, cardiac murmur, statin intolerance, chronic cough, osteomyelitis fifth toe left foot, diabetes, CKD stage III, nocturia, and iliac artery aneurysm.  His PMH also includes CABG with LIMA-LAD, SVG-D1, SVG-PDA, and Y graft off PDA - PLA 09/29/2018.  His echocardiogram showed an EF of 55-60% and no significant valvular disease.  He follows with Dr. Tresa Endo.  He was seen in follow-up by Dr. Bjorn Pippin on 05/03/2022 for evaluation of chest discomfort.  He reported that he was under an increased amount of stress due to his wife passing away from metastatic breast cancer.  He woke up 1 month ago with chest discomfort.  He went to the emergency department.  His workup was negative.  He had troponins which were negative x 2.  His chest pain lasted 45-60 minutes.  He reported improvement with nitroglycerin.  He had episodes of chest pain 2 days prior to the visit which resolved with nitroglycerin.  He reported being followed by pulmonology who had recently started treatment for COPD  exacerbation.  His echocardiogram 05/29/2022 showed an EF of 65-70%, G1 DD, trivial aortic valve regurgitation and mild aortic valve stenosis.  He underwent nuclear stress testing on 05/29/2022 which showed no ischemia and low risk.  EF was noted to be 61%.  He was seen in follow-up by his PCP 06/18/2022.  He reported that he he felt his metoprolol was causing anxiety.  He had stopped the medication and felt much better.  He was also noted to have a paradoxical response to Xanax causing worsening anxiety.  He continued to have chronic cough since 11/21.  Of note normal  chest x-ray 06/30/2020.  His blood pressure was fairly well-controlled.  He presented to the clinic 06/20/22 for follow-up evaluation and stated he had increased anxiety with metoprolol dosing.  He reported that after starting Imdur his blood pressure was much better controlled and over the last several weeks he had felt much better.  We reviewed his echocardiogram and recent nuclear stress testing.  He expressed understanding.  He had been taking his torsemide every other day and as needed.  He reported that prior to having osteomyelitis in his left foot he was following with interventional radiology for lower extremity atherosclerosis.  I recommended that he reach out for further evaluation.  He also indicated that he was seen by pharmacy lipid clinic for help with the cost of Repatha.  He had not heard anything about programs for the year.  We will reach out to pharmacy to help and contact the patient.  I continued his current medication regimen.  I planned for follow-up in 6 months.  He was seen in follow-up by Dr. Tresa Endo on 01/17/2023.  He reported that he had reached the donut hole and stopped taking Repatha due to cost.  He plans to restart in January.  He denied anginal symptoms.  He had been taking diuresis as needed.  He was following with Dr. Durene Cal for his left foot osteomyelitis.  His blood pressure was elevated and he was prescribed metoprolol succinate 12.5 mg daily.  He was also placed on torsemide 20 mg every other day and as needed for lower extremity swelling.  Follow-up was planned in 4 to 6 months.  He presents to the clinic today for follow-up evaluation and states***.  Today he denies chest pain, shortness of breath, lower extremity edema, fatigue, palpitations, melena, hematuria, hemoptysis, diaphoresis, weakness, presyncope, syncope, orthopnea, and PND.    Home Medications    Prior to Admission medications   Medication Sig Start Date End Date Taking? Authorizing Provider  aspirin  81 MG EC tablet Take 1 tablet (81 mg total) by mouth daily. 02/04/19   Lennette Bihari, MD  azelastine (ASTELIN) 0.1 % nasal spray Place 1 spray into both nostrils 2 (two) times daily. 08/16/20   Shelva Majestic, MD  clopidogrel (PLAVIX) 75 MG tablet TAKE 1 TABLET BY MOUTH EVERY DAY 04/12/22   Shelva Majestic, MD  Continuous Blood Gluc Receiver (FREESTYLE LIBRE 2 READER) DEVI See admin instructions. 12/18/20   [provider]  Continuous Blood Gluc Sensor (FREESTYLE LIBRE 2 SENSOR) MISC 1 Device by Does not apply route every 14 (fourteen) days. 03/18/22   Carlus Pavlov, MD  empagliflozin (JARDIANCE) 10 MG TABS tablet Take 1 tablet (10 mg total) by mouth daily before breakfast. 03/18/22   Carlus Pavlov, MD  ezetimibe (ZETIA) 10 MG tablet Take 1 tablet (10 mg total) by mouth daily. 12/18/21   Shelva Majestic, MD  fluticasone (FLONASE) 50  MCG/ACT nasal spray Place 2 sprays into both nostrils daily as needed for allergies or rhinitis.    [provider]  Fluticasone-Umeclidin-Vilant (TRELEGY ELLIPTA) 100-62.5-25 MCG/ACT AEPB Inhale 1 puff into the lungs daily. 06/03/22   Martina Sinner, MD  insulin aspart (NOVOLOG FLEXPEN) 100 UNIT/ML FlexPen Inject 20-25 Units into the skin 3 (three) times daily with meals. 3 times a day (just before each meal), 30-35-35 units, and pen needles 3/day 03/18/22   Carlus Pavlov, MD  insulin glargine (LANTUS SOLOSTAR) 100 UNIT/ML Solostar Pen Inject 20 Units into the skin at bedtime. Patient taking differently: Inject 10 Units into the skin at bedtime. 03/18/22   Carlus Pavlov, MD  Insulin Pen Needle (BD PEN NEEDLE NANO 2ND GEN) 32G X 4 MM MISC USE AS INSTRUCTED TO ADMINISTER INSULIN 5X DAILY 04/04/22   Carlus Pavlov, MD  isosorbide mononitrate (IMDUR) 30 MG 24 hr tablet Take 1 tablet (30 mg total) by mouth daily. 05/03/22 04/28/23  Little Ishikawa, MD  losartan (COZAAR) 50 MG tablet TAKE 1 TABLET (50 MG) BY MOUTH IN THE MORNING  AND AT BEDTIME 04/12/22   Shelva Majestic, MD  metoprolol succinate (TOPROL-XL) 50 MG 24 hr tablet Take 1.5 tablets (75 mg total) by mouth at bedtime. Take with or immediately following a meal. Patient not taking: Reported on 06/03/2022 05/03/22   Little Ishikawa, MD  montelukast (SINGULAIR) 10 MG tablet Take 1 tablet (10 mg total) by mouth at bedtime. 12/03/21   Martina Sinner, MD  nitroGLYCERIN (NITROSTAT) 0.4 MG SL tablet Place 1 tablet (0.4 mg total) under the tongue every 5 (five) minutes as needed for chest pain (if pain does not resolve with 2nd dose- seek care). 04/12/22   Shelva Majestic, MD  ondansetron (ZOFRAN-ODT) 4 MG disintegrating tablet Take 1 tablet (4 mg total) by mouth every 8 (eight) hours as needed for up to 20 doses for nausea or vomiting. Patient not taking: Reported on 06/18/2022 04/12/22   Shelva Majestic, MD  [facility] VERIO test strip CHECK LEVELS 4 TIMES DAILY 10/20/20   Romero Belling, MD  Pitavastatin Calcium (LIVALO) 1 MG TABS Take 1 tablet (1 mg total) by mouth daily. 03/12/21   Lennette Bihari, MD  torsemide (DEMADEX) 20 MG tablet MUST MAKE APPOINTMENT FOR FUTURE REFILL LAST ATTEMPT. TAKE 2 TABLETS BY MOUTH EVERY DAY Patient taking differently: Take 40 mg by mouth daily as needed (For fluid). 12/01/20   Lennette Bihari, MD  DAPTOmycin (CUBICIN) 500 MG injection Inject into the vein. Patient not taking: Reported on 10/22/2020 08/23/20   [provider]  DAPTOmycin 350 MG SOLR Inject into the vein. Patient not taking: No sig reported    [provider]    Family History    Family History  Problem Relation Age of Onset   Hypertension Mother    Heart disease Mother        CHF did not see doctor   Diabetes Maternal Grandmother    Diabetes Son    He indicated that his mother is deceased. He indicated that his father is deceased. He indicated that the status of his maternal grandmother is unknown. He indicated that the status of his son is  unknown.  Social History    Social History   Socioeconomic History   Marital status: Married    Spouse name: Not on file   Number of children: Not on file   Years of education: Not on file   Highest education  level: Not on file  Occupational History   Not on file  Tobacco Use   Smoking status: Former    Current packs/day: 0.00    Average packs/day: 0.8 packs/day for 2.0 years (1.5 ttl pk-yrs)    Types: Cigarettes    Start date: 2002    Quit date: 04/29/2002    Years since quitting: 21.0   Smokeless tobacco: Never   Tobacco comments:    smoked for 10 years on and off  Substance and Sexual Activity   Alcohol use: Yes    Comment: occasionally   Drug use: No   Sexual activity: Yes  Other Topics Concern   Not on file  Social History Narrative   Married. 3 kids. 7 grandkids. No greatgrandkids.       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      Hobbies: golf previously, yardwork   Social Drivers of Corporate investment banker Strain: Not on [employer] Insecurity: No Food Insecurity (08/05/2022)   Hunger Vital Sign    Worried About Running Out of Food in the Last Year: Never true    Ran Out of Food in the Last Year: Never true  Transportation Needs: No Transportation Needs (08/05/2022)   PRAPARE - Administrator, Civil Service (Medical): No    Lack of Transportation (Non-Medical): No  Physical Activity: Not on file  Stress: Not on file  Social Connections: Unknown (08/09/2022)   Received [facility], [facility]   Social Network    Social Network: Not on file  Intimate Partner Violence: Unknown (08/09/2022)   Received from [facility], [facility]   HITS    Physically Hurt: Not on file    Insult or Talk Down To: Not on file    Threaten Physical Harm: Not on file    Scream or Curse: Not on file     Review of Systems    General:  No chills, fever, night sweats or weight changes.  Cardiovascular:  No chest pain,  dyspnea on exertion, edema, orthopnea, palpitations, paroxysmal nocturnal dyspnea. Dermatological: No rash, lesions/masses Respiratory: No cough, dyspnea Urologic: No hematuria, dysuria Abdominal:   No nausea, vomiting, diarrhea, bright red blood per rectum, melena, or hematemesis Neurologic:  No visual changes, wkns, changes in mental status. All other systems reviewed and are otherwise negative except as noted above.  Physical Exam    VS:  There were no vitals taken for this visit. , BMI There is no height or weight on file to calculate BMI. GEN: Well nourished, well developed, in no acute distress. HEENT: normal. Neck: Supple, no JVD, carotid bruits, or masses. Cardiac: RRR, 3/6 systolic murmur hear***d along right sternal border., rubs, or gallops. No clubbing, cyanosis, edema.  Radials/DP/PT 2+ and equal bilaterally.  Respiratory:  Respirations regular and unlabored, clear to auscultation bilaterally. GI: Soft, nontender, nondistended, BS + x 4. MS: no deformity or atrophy. Skin: warm and dry, no rash. Neuro:  Strength and sensation are intact. Psych: Normal affect.  Accessory Clinical Findings    Recent Labs: 01/17/2023: TSH 3.040 04/15/2023: ALT 14; BUN 23; Creatinine, Ser 1.44; Hemoglobin 12.7; Platelets 275.0; Potassium 4.7; Sodium 137   Recent Lipid Panel    Component Value Date/Time   CHOL 136 01/17/2023 0900   TRIG 80 01/17/2023 0900   HDL 46 01/17/2023 0900   CHOLHDL 3.0 01/17/2023 0900   CHOLHDL 4 09/25/2021 1422   VLDL 24.0 09/25/2021 1422   LDLCALC  74 01/17/2023 0900   LDLDIRECT 173.0 07/01/2018 1118    No BP recorded.  {Refresh Note OR Click here to enter BP  :1}***    ECG personally reviewed by me today-none today.  Echocardiogram 05/29/2022  Indications:    R01.1 Murmur    History:        Patient has prior history of Echocardiogram examinations,  most                 recent 10/01/2018. CAD and Previous Myocardial Infarction,  Prior                  CABG, PAD, Arrythmias:Atrial Fibrillation,  Signs/Symptoms:Chest                 Pain; Risk Factors:Hypertension, Diabetes, Dyslipidemia  and                 Former Smoker. Chronic broncitis.    Sonographer:    Cathie Beams RCS  Referring Phys: [id number] CHRISTOPHER L SCHUMANN   IMPRESSIONS     1. Left ventricular ejection fraction, by estimation, is 65 to 70%. The  left ventricle has normal function. The left ventricle has no regional  wall motion abnormalities. Left ventricular diastolic parameters are  consistent with Grade I diastolic  dysfunction (impaired relaxation).   2. Right ventricular systolic function is normal. The right ventricular  size is normal.   3. The mitral valve is normal in structure. Trivial mitral valve  regurgitation. No evidence of mitral stenosis.   4. The aortic valve is calcified. Aortic valve regurgitation is trivial.  Mild aortic valve stenosis. Aortic valve area, by VTI measures 1.72 cm.  Aortic valve mean gradient measures 9.0 mmHg. Aortic valve Vmax measures  2.01 m/s.   5. The inferior vena cava is normal in size with greater than 50%  respiratory variability, suggesting right atrial pressure of 3 mmHg.   FINDINGS   Left Ventricle: Left ventricular ejection fraction, by estimation, is 65  to 70%. The left ventricle has normal function. The left ventricle has no  regional wall motion abnormalities. The left ventricular internal cavity  size was normal in size. There is   no left ventricular hypertrophy. Left ventricular diastolic parameters  are consistent with Grade I diastolic dysfunction (impaired relaxation).   Right Ventricle: The right ventricular size is normal. No increase in  right ventricular wall thickness. Right ventricular systolic function is  normal.   Left Atrium: Left atrial size was normal in size.   Right Atrium: Right atrial size was normal in size.   Pericardium: There is no evidence of pericardial effusion.    Mitral Valve: The mitral valve is normal in structure. Mild mitral annular  calcification. Trivial mitral valve regurgitation. No evidence of mitral  valve stenosis.   Tricuspid Valve: The tricuspid valve is normal in structure. Tricuspid  valve regurgitation is not demonstrated. No evidence of tricuspid  stenosis.   Aortic Valve: The aortic valve is calcified. Aortic valve regurgitation is  trivial. Mild aortic stenosis is present. Aortic valve mean gradient  measures 9.0 mmHg. Aortic valve peak gradient measures 16.2 mmHg. Aortic  valve area, by VTI measures 1.72 cm.   Pulmonic Valve: The pulmonic valve was normal in structure. Pulmonic valve  regurgitation is not visualized. No evidence of pulmonic stenosis.   Aorta: The aortic root is normal in size and structure.   Venous: The inferior vena cava is normal in size with greater than 50%  respiratory variability, suggesting  right atrial pressure of 3 mmHg.   IAS/Shunts: No atrial level shunt detected by color flow Doppler.     Nuclear stress test 05/29/2022     Findings are consistent with no ischemia. The study is low risk.   No ST deviation was noted.   LV perfusion is abnormal. There is no evidence of ischemia. There is no evidence of infarction. Defect 1: There is a medium defect with mild reduction in uptake present in the apical to mid inferior and inferolateral location(s) that is fixed. There is normal wall motion in the defect area. Consistent with artifact.   Left ventricular function is normal. Nuclear stress EF: 61 %. The left ventricular ejection fraction is normal (55-65%). End diastolic cavity size is normal. End systolic cavity size is normal.   Prior study not available for comparison.   There is an area of mildly reduced perfusion at rest in the mid to apical inferior and inferolateral wall. This improves with stress. Given low counts of study and extracardiac activity, suspect this is artifact.  Assessment  & Plan   1.  Coronary artery disease-denies recent anginal symptoms.  Nuclear stress test 05/29/2022 showed no ischemia and low risk.  Echocardiogram showed an LVEF of 65-70%, G1 DD, trivial aortic valve regurgitation and mild aortic valve stenosis. Continue current medical therapy.   Heart healthy low-sodium diet-reviewed Increase physical activity as tolerated  Hyperlipidemia-LDL***.   Continue aspirin, Plavix, ezetimibe, Repatha High fiber diet Increase physical activity as tolerated  Essential hypertension-BP today 136***/84 Continue losartan, torsemide, Imdur Heart healthy low-sodium diet-salty 6 given Increase physical activity as tolerated  Cardiac murmur-stable.  3/6 systolic murmur heard along right sternal border***.  Noted to have trivial aortic valve regurgitation on echocardiogram with mild aortic valve stenosis.   Will plan for repeat echocardiogram when clinically indicated  Disposition: Follow-up with Dr. Tresa Endo or me in 6 months.   Thomasene Ripple. Keylin Podolsky NP-C     05/15/2023, 1:18 PM [facility] [address] Office [phone] Fax [phone]    I spent 15*** minutes examining this patient, reviewing medications, and using patient centered shared decision making involving her cardiac care.  Prior to her visit I spent greater than 20 minutes reviewing her past medical history,  medications, and prior cardiac tests.

## 2023-05-15 NOTE — Telephone Encounter (Signed)
Pharmacy Patient Advocate Encounter  Received notification from [facility]  that Prior Authorization for  Livalo 1MG  tablets has been APPROVED from 05-14-2023 to 05-13-2024. Ran test claim, Copay is [id number] **. This test claim was processed through [facility]- copay amounts may vary at other pharmacies due to pharmacy/plan contracts, or as the patient moves through the different stages of their insurance plan.   PA #/Case ID/Reference #:  [id number]   ** Patient has deductible. ** Patient may be eligible for the Medicare Prescription Payment Plan. Patient will need to reach out to their insurance company to find out further information and to enroll.

## 2023-05-17 DIAGNOSIS — H40023 Open angle with borderline findings, high risk, bilateral: Secondary | ICD-10-CM | POA: Diagnosis not present

## 2023-05-17 DIAGNOSIS — H35013 Changes in retinal vascular appearance, bilateral: Secondary | ICD-10-CM | POA: Diagnosis not present

## 2023-05-17 DIAGNOSIS — D3131 Benign neoplasm of right choroid: Secondary | ICD-10-CM | POA: Diagnosis not present

## 2023-05-17 DIAGNOSIS — E113213 Type 2 diabetes mellitus with mild nonproliferative diabetic retinopathy with macular edema, bilateral: Secondary | ICD-10-CM | POA: Diagnosis not present

## 2023-05-19 ENCOUNTER — Ambulatory Visit: Payer: Medicare Other | Admitting: General Practice

## 2023-06-11 ENCOUNTER — Other Ambulatory Visit: Payer: Self-pay

## 2023-06-13 ENCOUNTER — Telehealth: Payer: Self-pay

## 2023-06-13 MED ORDER — INSULIN PEN NEEDLE 31G X 8 MM MISC: 5 refills | Status: AC

## 2023-06-13 NOTE — Progress Notes (Deleted)
 Cardiology Clinic Note   Patient Name: Martin Mankins [facility] Date of Encounter: 06/13/2023  Primary Care Provider:  Shelva Majestic, MD Primary Cardiologist:  Nicki Guadalajara, MD  Patient Profile    Martin Barrales [place] Sr. 78 year old male presents the clinic today for follow-up evaluation of his coronary artery disease, hyperlipidemia, and hypertension.  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 (HCC) 05/08/2020   Simple chronic bronchitis (HCC) 05/08/2020   Tobacco abuse    Past Surgical History:  Procedure Laterality Date   CORONARY ARTERY BYPASS GRAFT N/A 10/01/2018   Procedure: CORONARY ARTERY BYPASS GRAFTING (CABG) TIMES  FOUR USING LEFT MAMMARY ARTERY AND RIGHT GREATER SAPHEANOUS VEIN HARVESTED ENDOSCOPICALLY;  Surgeon: Loreli Slot, MD;  Location: [facility] OR;  Service: Open Heart Surgery;  Laterality: N/A;   HERNIA REPAIR     >10 years ago   IR ANGIOGRAM EXTREMITY LEFT  06/16/2020   IR RADIOLOGIST EVAL & MGMT  12/24/2016   IR RADIOLOGIST EVAL & MGMT  05/30/2020   IR RADIOLOGIST EVAL & MGMT  07/13/2020   IR RADIOLOGIST EVAL & MGMT  09/06/2020   IR RADIOLOGIST EVAL & MGMT  12/26/2020   IR THORACENTESIS ASP PLEURAL SPACE W/IMG GUIDE  10/06/2018   IR US GUIDE VASC ACCESS RIGHT  06/16/2020   LEFT HEART CATH AND CORONARY ANGIOGRAPHY N/A 09/29/2018   Procedure: LEFT HEART CATH AND CORONARY ANGIOGRAPHY;  Surgeon: Lennette Bihari, MD;  Location: [facility] INVASIVE CV LAB;  Service: Cardiovascular;  Laterality: N/A;   open heart surfery     right shoulder surgery     around 2014   TEE WITHOUT CARDIOVERSION N/A 10/01/2018   Procedure: TRANSESOPHAGEAL ECHOCARDIOGRAM (TEE);  Surgeon: Loreli Slot, MD;  Location: [facility] OR;   Service: Open Heart Surgery;  Laterality: N/A;    Allergies  Allergies  Allergen Reactions   Jardiance [Empagliflozin] Nausea And Vomiting   Simvastatin Diarrhea   Statins Other (See Comments)    achey joints   Xanax [Alprazolam] Anxiety    History of Present Illness    Martin PARTAIN Sr. has a PMH of coronary artery disease, hyperlipidemia, PAD, HTN, aortic atherosclerosis, cardiac murmur, statin intolerance, chronic cough, osteomyelitis fifth toe left foot, diabetes, CKD stage III, nocturia, and iliac artery aneurysm.  His PMH also includes CABG with LIMA-LAD, SVG-D1, SVG-PDA, and Y graft off PDA - PLA 09/29/2018.  His echocardiogram showed an EF of 55-60% and no significant valvular disease.  He follows with Dr. Tresa Endo.  He was seen in follow-up by Dr. Bjorn Pippin on 05/03/2022 for evaluation of chest discomfort.  He reported that he was under an increased amount of stress due to his wife passing away from metastatic breast cancer.  He woke up 1 month ago with chest discomfort.  He went to the emergency department.  His workup was negative.  He had troponins which were negative x 2.  His chest pain lasted 45-60 minutes.  He reported improvement with nitroglycerin.  He had episodes of chest pain 2 days prior to the visit which resolved with nitroglycerin.  He reported being followed by pulmonology who had recently started treatment for COPD  exacerbation.  His echocardiogram 05/29/2022 showed an EF of 65-70%, G1 DD, trivial aortic valve regurgitation and mild aortic valve stenosis.  He underwent nuclear stress testing on 05/29/2022 which showed no ischemia and low risk.  EF was noted to be 61%.  He was seen in follow-up by his PCP 06/18/2022.  He reported that he he felt his metoprolol was causing anxiety.  He had stopped the medication and felt much better.  He was also noted to have a paradoxical response to Xanax causing worsening anxiety.  He continued to have chronic cough since 11/21.  Of note normal  chest x-ray 06/30/2020.  His blood pressure was fairly well-controlled.  He presented to the clinic 06/20/22 for follow-up evaluation and stated he had increased anxiety with metoprolol dosing.  He reported that after starting Imdur his blood pressure was much better controlled and over the last several weeks he had felt much better.  We reviewed his echocardiogram and recent nuclear stress testing.  He expressed understanding.  He had been taking his torsemide every other day and as needed.  He reported that prior to having osteomyelitis in his left foot he was following with interventional radiology for lower extremity atherosclerosis.  I recommended that he reach out for further evaluation.  He also indicated that he was seen by pharmacy lipid clinic for help with the cost of Repatha.  He had not heard anything about programs for the year.  We will reach out to pharmacy to help and contact the patient.  I continued his current medication regimen.  I planned for follow-up in 6 months.  He was seen in follow-up by Dr. Tresa Endo on 01/17/2023.  He reported that he had reached the donut hole and stopped taking Repatha due to cost.  He plans to restart in January.  He denied anginal symptoms.  He had been taking diuresis as needed.  He was following with Dr. Durene Cal for his left foot osteomyelitis.  His blood pressure was elevated and he was prescribed metoprolol succinate 12.5 mg daily.  He was also placed on torsemide 20 mg every other day and as needed for lower extremity swelling.  Follow-up was planned in 4 to 6 months.  He presents to the clinic today for follow-up evaluation and states***.  Today he denies chest pain, shortness of breath, lower extremity edema, fatigue, palpitations, melena, hematuria, hemoptysis, diaphoresis, weakness, presyncope, syncope, orthopnea, and PND.    Home Medications    Prior to Admission medications   Medication Sig Start Date End Date Taking? Authorizing Provider  aspirin  81 MG EC tablet Take 1 tablet (81 mg total) by mouth daily. 02/04/19   Lennette Bihari, MD  azelastine (ASTELIN) 0.1 % nasal spray Place 1 spray into both nostrils 2 (two) times daily. 08/16/20   Shelva Majestic, MD  clopidogrel (PLAVIX) 75 MG tablet TAKE 1 TABLET BY MOUTH EVERY DAY 04/12/22   Shelva Majestic, MD  Continuous Blood Gluc Receiver (FREESTYLE LIBRE 2 READER) DEVI See admin instructions. 12/18/20   [provider]  Continuous Blood Gluc Sensor (FREESTYLE LIBRE 2 SENSOR) MISC 1 Device by Does not apply route every 14 (fourteen) days. 03/18/22   Carlus Pavlov, MD  empagliflozin (JARDIANCE) 10 MG TABS tablet Take 1 tablet (10 mg total) by mouth daily before breakfast. 03/18/22   Carlus Pavlov, MD  ezetimibe (ZETIA) 10 MG tablet Take 1 tablet (10 mg total) by mouth daily. 12/18/21   Shelva Majestic, MD  fluticasone (FLONASE) 50  MCG/ACT nasal spray Place 2 sprays into both nostrils daily as needed for allergies or rhinitis.    [provider]  Fluticasone-Umeclidin-Vilant (TRELEGY ELLIPTA) 100-62.5-25 MCG/ACT AEPB Inhale 1 puff into the lungs daily. 06/03/22   Martina Sinner, MD  insulin aspart (NOVOLOG FLEXPEN) 100 UNIT/ML FlexPen Inject 20-25 Units into the skin 3 (three) times daily with meals. 3 times a day (just before each meal), 30-35-35 units, and pen needles 3/day 03/18/22   Carlus Pavlov, MD  insulin glargine (LANTUS SOLOSTAR) 100 UNIT/ML Solostar Pen Inject 20 Units into the skin at bedtime. Patient taking differently: Inject 10 Units into the skin at bedtime. 03/18/22   Carlus Pavlov, MD  Insulin Pen Needle (BD PEN NEEDLE NANO 2ND GEN) 32G X 4 MM MISC USE AS INSTRUCTED TO ADMINISTER INSULIN 5X DAILY 04/04/22   Carlus Pavlov, MD  isosorbide mononitrate (IMDUR) 30 MG 24 hr tablet Take 1 tablet (30 mg total) by mouth daily. 05/03/22 04/28/23  Little Ishikawa, MD  losartan (COZAAR) 50 MG tablet TAKE 1 TABLET (50 MG) BY MOUTH IN THE MORNING  AND AT BEDTIME 04/12/22   Shelva Majestic, MD  metoprolol succinate (TOPROL-XL) 50 MG 24 hr tablet Take 1.5 tablets (75 mg total) by mouth at bedtime. Take with or immediately following a meal. Patient not taking: Reported on 06/03/2022 05/03/22   Little Ishikawa, MD  montelukast (SINGULAIR) 10 MG tablet Take 1 tablet (10 mg total) by mouth at bedtime. 12/03/21   Martina Sinner, MD  nitroGLYCERIN (NITROSTAT) 0.4 MG SL tablet Place 1 tablet (0.4 mg total) under the tongue every 5 (five) minutes as needed for chest pain (if pain does not resolve with 2nd dose- seek care). 04/12/22   Shelva Majestic, MD  ondansetron (ZOFRAN-ODT) 4 MG disintegrating tablet Take 1 tablet (4 mg total) by mouth every 8 (eight) hours as needed for up to 20 doses for nausea or vomiting. Patient not taking: Reported on 06/18/2022 04/12/22   Shelva Majestic, MD  [facility] VERIO test strip CHECK LEVELS 4 TIMES DAILY 10/20/20   Romero Belling, MD  Pitavastatin Calcium (LIVALO) 1 MG TABS Take 1 tablet (1 mg total) by mouth daily. 03/12/21   Lennette Bihari, MD  torsemide (DEMADEX) 20 MG tablet MUST MAKE APPOINTMENT FOR FUTURE REFILL LAST ATTEMPT. TAKE 2 TABLETS BY MOUTH EVERY DAY Patient taking differently: Take 40 mg by mouth daily as needed (For fluid). 12/01/20   Lennette Bihari, MD  DAPTOmycin (CUBICIN) 500 MG injection Inject into the vein. Patient not taking: Reported on 10/22/2020 08/23/20   [provider]  DAPTOmycin 350 MG SOLR Inject into the vein. Patient not taking: No sig reported    [provider]    Family History    Family History  Problem Relation Age of Onset   Hypertension Mother    Heart disease Mother        CHF did not see doctor   Diabetes Maternal Grandmother    Diabetes Son    He indicated that his mother is deceased. He indicated that his father is deceased. He indicated that the status of his maternal grandmother is unknown. He indicated that the status of his son is  unknown.  Social History    Social History   Socioeconomic History   Marital status: Married    Spouse name: Not on file   Number of children: Not on file   Years of education: Not on file   Highest education  level: Not on file  Occupational History   Not on file  Tobacco Use   Smoking status: Former    Current packs/day: 0.00    Average packs/day: 0.8 packs/day for 2.0 years (1.5 ttl pk-yrs)    Types: Cigarettes    Start date: 2002    Quit date: 04/29/2002    Years since quitting: 21.1   Smokeless tobacco: Never   Tobacco comments:    smoked for 10 years on and off  Substance and Sexual Activity   Alcohol use: Yes    Comment: occasionally   Drug use: No   Sexual activity: Yes  Other Topics Concern   Not on file  Social History Narrative   Married. 3 kids. 7 grandkids. No greatgrandkids.       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      Hobbies: golf previously, yardwork   Social Drivers of Corporate investment banker Strain: Not on [employer] Insecurity: No Food Insecurity (08/05/2022)   Hunger Vital Sign    Worried About Running Out of Food in the Last Year: Never true    Ran Out of Food in the Last Year: Never true  Transportation Needs: No Transportation Needs (08/05/2022)   PRAPARE - Administrator, Civil Service (Medical): No    Lack of Transportation (Non-Medical): No  Physical Activity: Not on file  Stress: Not on file  Social Connections: Unknown (08/09/2022)   Received from [facility]   Social Network    Social Network: Not on file  Intimate Partner Violence: Unknown (08/09/2022)   Received from [facility]   HITS    Physically Hurt: Not on file    Insult or Talk Down To: Not on file    Threaten Physical Harm: Not on file    Scream or Curse: Not on file     Review of Systems    General:  No chills, fever, night sweats or weight changes.  Cardiovascular:  No chest pain,  dyspnea on exertion, edema, orthopnea, palpitations, paroxysmal nocturnal dyspnea. Dermatological: No rash, lesions/masses Respiratory: No cough, dyspnea Urologic: No hematuria, dysuria Abdominal:   No nausea, vomiting, diarrhea, bright red blood per rectum, melena, or hematemesis Neurologic:  No visual changes, wkns, changes in mental status. All other systems reviewed and are otherwise negative except as noted above.  Physical Exam    VS:  There were no vitals taken for this visit. , BMI There is no height or weight on file to calculate BMI. GEN: Well nourished, well developed, in no acute distress. HEENT: normal. Neck: Supple, no JVD, carotid bruits, or masses. Cardiac: RRR, 3/6 systolic murmur hear***d along right sternal border., rubs, or gallops. No clubbing, cyanosis, edema.  Radials/DP/PT 2+ and equal bilaterally.  Respiratory:  Respirations regular and unlabored, clear to auscultation bilaterally. GI: Soft, nontender, nondistended, BS + x 4. MS: no deformity or atrophy. Skin: warm and dry, no rash. Neuro:  Strength and sensation are intact. Psych: Normal affect.  Accessory Clinical Findings    Recent Labs: 01/17/2023: TSH 3.040 04/15/2023: ALT 14; BUN 23; Creatinine, Ser 1.44; Hemoglobin 12.7; Platelets 275.0; Potassium 4.7; Sodium 137   Recent Lipid Panel    Component Value Date/Time   CHOL 136 01/17/2023 0900   TRIG 80 01/17/2023 0900   HDL 46 01/17/2023 0900   CHOLHDL 3.0 01/17/2023 0900   CHOLHDL 4 09/25/2021 1422   VLDL 24.0 09/25/2021 1422   LDLCALC  74 01/17/2023 0900   LDLDIRECT 173.0 07/01/2018 1118    No BP recorded.  {Refresh Note OR Click here to enter BP  :1}***    ECG personally reviewed by me today-none today.  Echocardiogram 05/29/2022  Indications:    R01.1 Murmur    History:        Patient has prior history of Echocardiogram examinations,  most                 recent 10/01/2018. CAD and Previous Myocardial Infarction,  Prior                  CABG, PAD, Arrythmias:Atrial Fibrillation,  Signs/Symptoms:Chest                 Pain; Risk Factors:Hypertension, Diabetes, Dyslipidemia  and                 Former Smoker. Chronic broncitis.    Sonographer:    Cathie Beams RCS  Referring Phys: [id number] CHRISTOPHER L SCHUMANN   IMPRESSIONS     1. Left ventricular ejection fraction, by estimation, is 65 to 70%. The  left ventricle has normal function. The left ventricle has no regional  wall motion abnormalities. Left ventricular diastolic parameters are  consistent with Grade I diastolic  dysfunction (impaired relaxation).   2. Right ventricular systolic function is normal. The right ventricular  size is normal.   3. The mitral valve is normal in structure. Trivial mitral valve  regurgitation. No evidence of mitral stenosis.   4. The aortic valve is calcified. Aortic valve regurgitation is trivial.  Mild aortic valve stenosis. Aortic valve area, by VTI measures 1.72 cm.  Aortic valve mean gradient measures 9.0 mmHg. Aortic valve Vmax measures  2.01 m/s.   5. The inferior vena cava is normal in size with greater than 50%  respiratory variability, suggesting right atrial pressure of 3 mmHg.   FINDINGS   Left Ventricle: Left ventricular ejection fraction, by estimation, is 65  to 70%. The left ventricle has normal function. The left ventricle has no  regional wall motion abnormalities. The left ventricular internal cavity  size was normal in size. There is   no left ventricular hypertrophy. Left ventricular diastolic parameters  are consistent with Grade I diastolic dysfunction (impaired relaxation).   Right Ventricle: The right ventricular size is normal. No increase in  right ventricular wall thickness. Right ventricular systolic function is  normal.   Left Atrium: Left atrial size was normal in size.   Right Atrium: Right atrial size was normal in size.   Pericardium: There is no evidence of pericardial effusion.    Mitral Valve: The mitral valve is normal in structure. Mild mitral annular  calcification. Trivial mitral valve regurgitation. No evidence of mitral  valve stenosis.   Tricuspid Valve: The tricuspid valve is normal in structure. Tricuspid  valve regurgitation is not demonstrated. No evidence of tricuspid  stenosis.   Aortic Valve: The aortic valve is calcified. Aortic valve regurgitation is  trivial. Mild aortic stenosis is present. Aortic valve mean gradient  measures 9.0 mmHg. Aortic valve peak gradient measures 16.2 mmHg. Aortic  valve area, by VTI measures 1.72 cm.   Pulmonic Valve: The pulmonic valve was normal in structure. Pulmonic valve  regurgitation is not visualized. No evidence of pulmonic stenosis.   Aorta: The aortic root is normal in size and structure.   Venous: The inferior vena cava is normal in size with greater than 50%  respiratory variability, suggesting  right atrial pressure of 3 mmHg.   IAS/Shunts: No atrial level shunt detected by color flow Doppler.     Nuclear stress test 05/29/2022     Findings are consistent with no ischemia. The study is low risk.   No ST deviation was noted.   LV perfusion is abnormal. There is no evidence of ischemia. There is no evidence of infarction. Defect 1: There is a medium defect with mild reduction in uptake present in the apical to mid inferior and inferolateral location(s) that is fixed. There is normal wall motion in the defect area. Consistent with artifact.   Left ventricular function is normal. Nuclear stress EF: 61 %. The left ventricular ejection fraction is normal (55-65%). End diastolic cavity size is normal. End systolic cavity size is normal.   Prior study not available for comparison.   There is an area of mildly reduced perfusion at rest in the mid to apical inferior and inferolateral wall. This improves with stress. Given low counts of study and extracardiac activity, suspect this is artifact.  Assessment  & Plan   1.  Coronary artery disease-denies recent anginal symptoms.  Nuclear stress test 05/29/2022 showed no ischemia and low risk.  Echocardiogram showed an LVEF of 65-70%, G1 DD, trivial aortic valve regurgitation and mild aortic valve stenosis. Continue current medical therapy.   Heart healthy low-sodium diet-reviewed Increase physical activity as tolerated  Hyperlipidemia-LDL***.   Continue aspirin, Plavix, ezetimibe, Repatha High fiber diet Increase physical activity as tolerated  Essential hypertension-BP today 136***/84 Continue losartan, torsemide, Imdur Heart healthy low-sodium diet-salty 6 given Increase physical activity as tolerated  Cardiac murmur-stable.  3/6 systolic murmur heard along right sternal border***.  Noted to have trivial aortic valve regurgitation on echocardiogram with mild aortic valve stenosis.   Will plan for repeat echocardiogram when clinically indicated  Disposition: Follow-up with Dr. Tresa Endo or me in 6 months.   Thomasene Ripple. Jaimarie Rapozo NP-C     06/13/2023, 8:49 AM [facility] [address] Office [phone] Fax [phone]    I spent 15*** minutes examining this patient, reviewing medications, and using patient centered shared decision making involving her cardiac care.  Prior to her visit I spent greater than 20 minutes reviewing her past medical history,  medications, and prior cardiac tests.

## 2023-06-13 NOTE — Telephone Encounter (Signed)
Pt called wanting to switch his pen needles from 4mm to 8 mm  Requested Prescriptions   Signed Prescriptions Disp Refills   Insulin Pen Needle 31G X 8 MM MISC 400 each 5    Sig: Use to inject insulin 4 times a day.    Authorizing Provider: Carlus Pavlov    Ordering User: Pollie Meyer

## 2023-06-16 ENCOUNTER — Telehealth: Payer: Self-pay | Admitting: Cardiovascular Disease

## 2023-06-16 NOTE — Telephone Encounter (Signed)
 Patient wants to switch from Dr. Tresa Endo to Dr. Bjorn Pippin.

## 2023-06-17 ENCOUNTER — Ambulatory Visit: Payer: Medicare Other | Admitting: General Practice

## 2023-06-17 NOTE — Telephone Encounter (Signed)
 OK with me Martin [name]

## 2023-06-17 NOTE — Telephone Encounter (Signed)
 I am retiring this year.  Okay by me.

## 2023-06-29 ENCOUNTER — Other Ambulatory Visit: Payer: Self-pay | Admitting: Family Medicine

## 2023-08-19 DIAGNOSIS — K080 Exfoliation of teeth due to systemic causes: Secondary | ICD-10-CM | POA: Diagnosis not present

## 2023-08-20 ENCOUNTER — Encounter: Payer: Self-pay | Admitting: Internal Medicine

## 2023-08-20 ENCOUNTER — Telehealth: Payer: Self-pay

## 2023-08-20 MED ORDER — LANTUS SOLOSTAR 100 UNIT/ML ~~LOC~~ SOPN: 40.0000 [IU] | PEN_INJECTOR | Freq: Every day | SUBCUTANEOUS | 3 refills | Status: DC

## 2023-08-20 NOTE — Telephone Encounter (Signed)
 Requested Prescriptions   Signed Prescriptions Disp Refills   insulin  glargine (LANTUS  SOLOSTAR) 100 UNIT/ML Solostar Pen 30 mL 3    Sig: Inject 40 Units into the skin at bedtime.    Authorizing Provider: Emilie Harden    Ordering User: Vernon Goodpasture

## 2023-08-21 ENCOUNTER — Telehealth: Payer: Self-pay | Admitting: Pharmacy Technician

## 2023-08-21 ENCOUNTER — Telehealth: Payer: Self-pay | Admitting: Cardiovascular Disease

## 2023-08-21 ENCOUNTER — Other Ambulatory Visit (HOSPITAL_COMMUNITY): Payer: Self-pay

## 2023-08-21 NOTE — Telephone Encounter (Signed)
 Pt c/o medication issue:  1. Name of Medication: Evolocumab  (REPATHA  SURECLICK) 140 MG/ML SOAJ   2. How are you currently taking this medication (dosage and times per day)?    3. Are you having a reaction (difficulty breathing--STAT)? no  4. What is your medication issue? Medication is being denied coverage due to lack of information. Will send over fax requesting what information is needed. Please advise

## 2023-08-21 NOTE — Telephone Encounter (Signed)
 Pharmacy Patient Advocate Encounter   Received notification from Pt Calls Messages that prior authorization for REPATHA  is required/requested.   Insurance verification completed.   The patient is insured through [facility] .   Per test claim: PA required; PA submitted to above mentioned insurance via CoverMyMeds Key/confirmation #/EOC [id number] Status is pending

## 2023-08-22 ENCOUNTER — Telehealth: Payer: Self-pay

## 2023-08-22 NOTE — Telephone Encounter (Signed)
Sent more information.

## 2023-08-22 NOTE — Telephone Encounter (Signed)
-----   Message from Emilie Harden sent at 08/20/2023  3:09 PM EDT ----- Regarding: FW:  I did not see in all the 9 pages what they prefer instead. We can work with basaglar , semglee , toujeo , tresiba - same dose. Ty! C ----- Message ----- From: Vernon Goodpasture, CMA Sent: 08/20/2023   2:43 PM EDT To: Emilie Harden, MD

## 2023-08-22 NOTE — Telephone Encounter (Signed)
 I attempted to send the Basaglar  but a message pops up stating its not covered but Lantus  is. That rx is already at the pharmacy.

## 2023-08-26 ENCOUNTER — Encounter: Payer: Self-pay | Admitting: Internal Medicine

## 2023-08-26 ENCOUNTER — Ambulatory Visit: Payer: Medicare Other | Admitting: Internal Medicine

## 2023-08-26 VITALS — BP 120/70 | HR 92 | Ht 72.0 in | Wt 278.4 lb

## 2023-08-26 DIAGNOSIS — E1169 Type 2 diabetes mellitus with other specified complication: Secondary | ICD-10-CM

## 2023-08-26 DIAGNOSIS — E785 Hyperlipidemia, unspecified: Secondary | ICD-10-CM

## 2023-08-26 DIAGNOSIS — Z794 Long term (current) use of insulin: Secondary | ICD-10-CM

## 2023-08-26 DIAGNOSIS — E1151 Type 2 diabetes mellitus with diabetic peripheral angiopathy without gangrene: Secondary | ICD-10-CM

## 2023-08-26 LAB — POCT GLYCOSYLATED HEMOGLOBIN (HGB A1C): Hemoglobin A1C: 8.4 % — AB (ref 4.0–5.6)

## 2023-08-26 MED ORDER — LANTUS SOLOSTAR 100 UNIT/ML ~~LOC~~ SOPN: 40.0000 [IU] | PEN_INJECTOR | Freq: Every day | SUBCUTANEOUS | 3 refills | Status: DC

## 2023-08-26 NOTE — Progress Notes (Signed)
 Patient ID: Martin Piper Sr., male   DOB: Aug 08, 1945, 78 y.o.   MRN: [id number]  HPI: Martin CUCCO Sr. is a 78 y.o.-year-old male, returning for follow-up for DM2, dx in 2004, insulin -dependent since 2013, uncontrolled, with complications (CAD - s/p AMI, s/p CABG 2020, PAD - s/p angioplasty, A fib, CKD, DR, PN). Pt. previously saw Dr. Washington Hacker, but last visit with me 4 months ago.  Interim history: No increased urination, chest pain, nausea, blurry vision.   She does describe ED.  Reviewed HbA1c: Lab Results  Component Value Date   HGBA1C 8.9 (H) 04/15/2023   HGBA1C 7.9 (A) 07/22/2022   HGBA1C 8.9 (A) 03/18/2022   HGBA1C 8.3 (A) 08/20/2021   HGBA1C 8.1 (A) 06/04/2021   HGBA1C 9.1 (A) 12/18/2020   HGBA1C 8.1 (H) 08/16/2020   HGBA1C 8.6 (H) 02/09/2020   HGBA1C 7.6 (H) 07/08/2019   HGBA1C 8.2 (H) 01/06/2019   Previously on: - Novolog  30 units 3x a day with meals, including in the middle of the night He was on Lantus  before, sugars were better controlled then.  Currently on: - Lantus  20 >> 25 >> 30 >>  30-35 >> 40 units at bedtime - Novolog  20-25 >> 15-20 >> 24 >> 20-28 units 15 min before the 3 meals - Humalog  He was previously on Jardiance  but developed abdominal pain/nausea and vomiting-had to go to the hospital by ambulance and mL of the night in 03/2022.  He would not want to restart this.  Pt checks his sugars >4x a day and they are:  Prev.:   Previously:   Lowest sugar was >100 >> 70 >> 100 >> 50s (compression low?); he has hypoglycemia awareness at 100.  Highest sugar was 300s >> 200s >> upper 200s >> 300  Glucometer: One Touch Verio  - + CKD, last BUN/creatinine:  Lab Results  Component Value Date   BUN 23 04/15/2023   BUN 19 01/17/2023   CREATININE 1.44 04/15/2023   CREATININE 1.30 (H) 01/17/2023   Lab Results  Component Value Date   MICRALBCREAT 19.7 04/15/2023   MICRALBCREAT 39.4 (H) 12/18/2021   MICRALBCREAT 8.2 01/06/2019   MICRALBCREAT 20.1  08/08/2015   MICRALBCREAT 4.5 04/05/2014   MICRALBCREAT 5.3 01/11/2014   MICRALBCREAT 2.0 10/04/2009   MICRALBCREAT 9.5 04/03/2009  On Cozaar  50 mg daily.  - + HL; last set of lipids: Lab Results  Component Value Date   CHOL 136 01/17/2023   HDL 46 01/17/2023   LDLCALC 74 01/17/2023   LDLDIRECT 173.0 07/01/2018   TRIG 80 01/17/2023   CHOLHDL 3.0 01/17/2023  On Livalo  1 mg daily, Zetia  10 mg daily.  Also now on Repatha .  - last eye exam was on 11/27/2022.  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  He previously had no DR. He has IO inj. Dr. Damian Duke.  - + numbness and tingling in his feet.  Last foot exam was on 07/22/2022. He has history of osteomyelitis of his left toe, but this improved after his angioplasty procedure.  His wife died 2022-02-22.  He was grieving and under a lot of stress.  Now feeling better on escitalopram .  ROS: + see HPI  Past Medical History:  Diagnosis Date   A-fib (HCC)    CKD (chronic kidney disease) stage 3, GFR 30-59 ml/min (HCC) 08/29/2020   Diabetes mellitus    Heart attack (HCC) 10/01/2018   Pt had open heart surgery   Hyperkalemia 08/29/2020   Hypertension    Osteomyelitis of  fifth toe of left foot (HCC) 05/08/2020   Simple chronic bronchitis (HCC) 05/08/2020   Tobacco abuse    Past Surgical History:  Procedure Laterality Date   CORONARY ARTERY BYPASS GRAFT N/A 10/01/2018   Procedure: CORONARY ARTERY BYPASS GRAFTING (CABG) TIMES  FOUR USING LEFT MAMMARY ARTERY AND RIGHT GREATER SAPHEANOUS VEIN HARVESTED ENDOSCOPICALLY;  Surgeon: Zelphia Higashi, MD;  Location: [facility] OR;  Service: Open Heart Surgery;  Laterality: N/A;   HERNIA REPAIR     >10 years ago   IR ANGIOGRAM EXTREMITY LEFT  06/16/2020   IR RADIOLOGIST EVAL & MGMT  12/24/2016   IR RADIOLOGIST EVAL & MGMT  05/30/2020   IR RADIOLOGIST EVAL & MGMT  07/13/2020   IR RADIOLOGIST EVAL & MGMT  09/06/2020   IR RADIOLOGIST EVAL & MGMT  12/26/2020   IR THORACENTESIS ASP PLEURAL SPACE W/IMG GUIDE   10/06/2018   IR US  GUIDE VASC ACCESS RIGHT  06/16/2020   LEFT HEART CATH AND CORONARY ANGIOGRAPHY N/A 09/29/2018   Procedure: LEFT HEART CATH AND CORONARY ANGIOGRAPHY;  Surgeon: Millicent Ally, MD;  Location: [facility];  Service: Cardiovascular;  Laterality: N/A;   open heart surfery     right shoulder surgery     around 2014   TEE WITHOUT CARDIOVERSION N/A 10/01/2018   Procedure: TRANSESOPHAGEAL ECHOCARDIOGRAM (TEE);  Surgeon: Zelphia Higashi, MD;  Location: [facility] OR;  Service: Open Heart Surgery;  Laterality: N/A;   Social History   Socioeconomic History   Marital status: Married    Spouse name: Not on file   Number of children: Not on file   Years of education: Not on file   Highest education level: Not on file  Occupational History   Not on file  Tobacco Use   Smoking status: Former    Current packs/day: 0.00    Average packs/day: 0.8 packs/day for 2.0 years (1.5 ttl pk-yrs)    Types: Cigarettes    Start date: 2002    Quit date: 04/29/2002    Years since quitting: 21.3   Smokeless tobacco: Never   Tobacco comments:    smoked for 10 years on and off  Substance and Sexual Activity   Alcohol use: Yes    Comment: occasionally   Drug use: No   Sexual activity: Yes  Other Topics Concern   Not on file  Social History Narrative   Married. 3 kids. 7 grandkids. No greatgrandkids.       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      Hobbies: golf previously, yardwork   Social Drivers of Corporate investment banker Strain: Not on [employer] Insecurity: No Food Insecurity (08/05/2022)   Hunger Vital Sign    Worried About Running Out of Food in the Last Year: Never true    Ran Out of Food in the Last Year: Never true  Transportation Needs: No Transportation Needs (08/05/2022)   PRAPARE - Administrator, Civil Service (Medical): No    Lack of Transportation (Non-Medical): No  Physical Activity: Not on file  Stress: Not on file   Social Connections: Unknown (08/09/2022)   Received [facility]   Social Network    Social Network: Not on file  Intimate Partner Violence: Unknown (08/09/2022)   Received from [facility], [facility]   HITS    Physically Hurt: Not on file    Insult or Talk Down To: Not on file  Threaten Physical Harm: Not on file    Scream or Curse: Not on file   Current Outpatient Medications on File Prior to Visit  Medication Sig Dispense Refill   aspirin  81 MG EC tablet Take 1 tablet (81 mg total) by mouth daily.     azelastine  (ASTELIN ) 0.1 % nasal spray Place 1 spray into both nostrils 2 (two) times daily. 30 mL 12   clopidogrel  (PLAVIX ) 75 MG tablet TAKE 1 TABLET(75 MG) BY MOUTH DAILY 90 tablet 3   Continuous Blood Gluc Receiver (FREESTYLE LIBRE 2 READER) DEVI See admin instructions.     Continuous Glucose Sensor (FREESTYLE LIBRE 2 SENSOR) MISC 1 Device by Does not apply route every 14 (fourteen) days. 6 each 3   Continuous Glucose Sensor (FREESTYLE LIBRE 3 PLUS SENSOR) MISC 1 each by Does not apply route every 14 (fourteen) days. 6 each 3   escitalopram  (LEXAPRO ) 5 MG tablet TAKE 1 TABLET(5 MG) BY MOUTH DAILY 90 tablet 3   Evolocumab  (REPATHA  SURECLICK) 140 MG/ML SOAJ Inject 140 mg into the skin every 14 (fourteen) days. (Patient not taking: Reported on 04/21/2023) 6 mL 3   ezetimibe  (ZETIA ) 10 MG tablet TAKE 1 TABLET BY MOUTH EVERY DAY 90 tablet 3   fluticasone  (FLONASE ) 50 MCG/ACT nasal spray Place 2 sprays into both nostrils daily as needed for allergies or rhinitis.     Fluticasone -Umeclidin-Vilant (TRELEGY ELLIPTA ) 100-62.5-25 MCG/ACT AEPB INHALE 1 PUFF INTO THE LUNGS DAILY 60 each 1   HUMALOG  KWIKPEN 200 UNIT/ML KwikPen ADMINISTER 15 TO 25 UNITS UNDER THE SKIN THREE TIMES DAILY BEFORE MEALS 12 mL 3   hydrOXYzine (ATARAX) 25 MG tablet Take 25 mg by mouth every 6 (six) hours as needed for anxiety.     insulin  aspart (NOVOLOG  FLEXPEN) 100 UNIT/ML FlexPen Inject 20-25  Units into the skin 3 (three) times daily with meals. 3 times a day (just before each meal), 30-35-35 units, and pen needles 3/day (Patient not taking: Reported on 04/21/2023) 75 mL 3   Insulin  Pen Needle 31G X 8 MM MISC Use to inject insulin  4 times a day. 400 each 5   isosorbide  mononitrate (IMDUR ) 30 MG 24 hr tablet TAKE 1 TABLET BY MOUTH EVERY DAY 90 tablet 3   losartan  (COZAAR ) 50 MG tablet TAKE 1 TABLET BY MOUTH EVERY MORNING AND AT BEDTIME. 180 tablet 3   metoprolol  succinate (TOPROL  XL) 25 MG 24 hr tablet Take 0.5 tablets (12.5 mg total) by mouth daily. 90 tablet 3   montelukast  (SINGULAIR ) 10 MG tablet Take 1 tablet (10 mg total) by mouth at bedtime. 30 tablet 11   nitroGLYCERIN  (NITROSTAT ) 0.4 MG SL tablet Place 1 tablet (0.4 mg total) under the tongue every 5 (five) minutes as needed for chest pain (if pain does not resolve with 2nd dose- seek care). 20 tablet 5   ondansetron  (ZOFRAN -ODT) 4 MG disintegrating tablet Take 1 tablet (4 mg total) by mouth every 8 (eight) hours as needed for up to 20 doses for nausea or vomiting. 20 tablet 0   ONETOUCH VERIO test strip CHECK LEVELS 4 TIMES DAILY 100 strip 2   Pitavastatin  Calcium  (LIVALO ) 1 MG TABS Take 1 tablet (1 mg total) by mouth daily. 90 tablet 3   torsemide  (DEMADEX ) 20 MG tablet TAKE 1 TABLET BY MOUTH EVERY OTHER DAY FOR ANKLE SWELLING 45 tablet 1   [DISCONTINUED] DAPTOmycin  (CUBICIN ) 500 MG injection Inject into the vein. (Patient not taking: Reported on 10/22/2020)     [DISCONTINUED] DAPTOmycin  350  MG SOLR Inject into the vein. (Patient not taking: No sig reported)     No current facility-administered medications on file prior to visit.   Allergies  Allergen Reactions   Jardiance  [Empagliflozin ] Nausea And Vomiting   Simvastatin  Diarrhea   Statins Other (See Comments)    achey joints   Xanax  [Alprazolam ] Anxiety   Family History  Problem Relation Age of Onset   Hypertension Mother    Heart disease Mother        CHF did not  see doctor   Diabetes Maternal Grandmother    Diabetes Son    PE: BP 120/70   Pulse 92   Ht 6' (1.829 m)   Wt 278 lb 6.4 oz (126.3 kg)   SpO2 97%   BMI 37.76 kg/m  Wt Readings from Last 3 Encounters:  08/26/23 278 lb 6.4 oz (126.3 kg)  04/21/23 281 lb 6.4 oz (127.6 kg)  04/15/23 279 lb 9.6 oz (126.8 kg)   Constitutional: overweight, in NAD Eyes:  EOMI, no exophthalmos ENT: no neck masses, no cervical lymphadenopathy Cardiovascular: RRR, No MRG Respiratory: CTA B in upper lungs but B lower rhonchi Musculoskeletal: no deformities Skin:no rashes Neurological: no tremor with outstretched hands  ASSESSMENT: 1. DM2, insulin -dependent, uncontrolled, with complications - CAD - s/p AMI, s/p CABG - PAD - s/p angioplasty 02.2022 - A fib - CKD - DR - PN; h/o osteomyelitis L 5th toe  2. HL  PLAN:  1. Patient with longstanding, uncontrolled, type 2 diabetes, basal-bolus insulin  regiment with still poor control.  At last visit, HbA1c was 8.9%, higher.  Of note, he was on SGLT2 inhibitor in the past (Jardiance ) but developed nausea/vomiting/abdominal pain. - At last visit, sugars were high at all times of the day and increasing more after lunch.  We discussed about the absolute need to improve diet.  He was not wearing the dose of Humalog  based on the size of his meals and I advised him to start doing so. CGM interpretation: -At today's visit, we reviewed his CGM downloads: It appears that 44% of values are in target range (goal >70%), while 56% are higher than 180 (goal <25%), and 0% are lower than 70 (goal <4%).  The calculated average blood sugar is 189.  The projected HbA1c for the next 3 months (GMI) is 7.8%. -Reviewing the CGM trends, sugars appear to be slightly better than before, improving overnight but then increasing after every meal particularly after lunch.  He is using a lower dose of Humalog  and he is not taking this always 15 minutes before the meal.  We discussed about  increasing Humalog , taking it even longer before meals since he described that it takes a long time until Humalog  takes effect, and we also discussed about trying to inject in thighs to avoid scar tissue.  He can do this easier during the summer, he mentions.  I also again advised him to try to bolus insulin  before coffee as sugars increase after this.  Will keep the dose of Lantus  the same. -He describes symptoms of ED.  We discussed about seeing urology. - I suggested to:  Patient Instructions  Please continue: - Lantus  40 units daily  Please increase: - Humalog  U200 24-28 units 15-30 min before the 3 meals  You may need to bolus 4-5 units before coffee.  For neuropathy: - alpha-lipoic acid 600 mg 2x a day  Please return in 3-4 months.   - we checked his HbA1c: 8.4% (slightly lower) - advised  to check sugars at different times of the day - 4x a day, rotating check times - advised for yearly eye exams >> he is UTD - he has an with [email or web address]  - return to clinic in 3-4 months  2. HL Latest lipid panel reviewed from 12/2022: LDL above target but much improved in the last 5 years: Lab Results  Component Value Date   CHOL 136 01/17/2023   HDL 46 01/17/2023   LDLCALC 74 01/17/2023   LDLDIRECT 173.0 07/01/2018   TRIG 80 01/17/2023   CHOLHDL 3.0 01/17/2023  - He continues Livalo  1 mg daily, Zetia  10 mg daily and Repatha .  He is followed by cardiology. - Of note, he has a very high LP(a) (01/17/2023): 138.4.    Emilie Harden, MD PhD [facility]

## 2023-08-26 NOTE — Patient Instructions (Addendum)
 Please continue: - Lantus  40 units daily  Please increase: - Humalog  U200 24-28 units 15-30 min before the 3 meals  You may need to bolus 4-5 units before coffee.  For neuropathy: - alpha-lipoic acid 600 mg 2x a day  Please return in 3-4 months.

## 2023-08-27 ENCOUNTER — Encounter: Payer: Self-pay | Admitting: Internal Medicine

## 2023-08-27 ENCOUNTER — Telehealth: Payer: Self-pay | Admitting: Pharmacy Technician

## 2023-08-27 ENCOUNTER — Other Ambulatory Visit (HOSPITAL_COMMUNITY): Payer: Self-pay

## 2023-08-27 LAB — MICROALBUMIN / CREATININE URINE RATIO
Creatinine, Urine: 157 mg/dL (ref 20–320)
Microalb Creat Ratio: 334 mg/g{creat} — ABNORMAL HIGH (ref <30)
Microalb, Ur: 52.5 mg/dL

## 2023-08-27 MED ORDER — REPATHA SURECLICK 140 MG/ML ~~LOC~~ SOAJ: 140.0000 mg | SUBCUTANEOUS | 3 refills | Status: DC

## 2023-08-27 NOTE — Addendum Note (Signed)
 Addended by: Ninoska Goswick D on: 08/27/2023 02:44 PM   Modules accepted: Orders

## 2023-08-27 NOTE — Telephone Encounter (Signed)
 Pharmacy Patient Advocate Encounter  Received notification from [facility] that Prior Authorization for repatha  has been APPROVED from 08/26/23 to 08/25/24. Ran test claim, Copay is $93.81. This test claim was processed through [facility] [address] [address]- copay amounts may vary at other pharmacies due to pharmacy/plan contracts, or as the patient moves through the different stages of their insurance plan.   The patients prescription has now expired. Can someone send him in a new prescription for repatha  to his pharmacy? Thank you!    I also gave [facility] his new healthwell grant information of: BIN: [id number] PCN: [id number] ID: [id number] GROUP: [id number]

## 2023-08-27 NOTE — Telephone Encounter (Signed)
 Patient Advocate Encounter   The patient was approved for a [employer] grant that will help cover the cost of REPATHA  Total amount awarded, 2500.00.  Effective: 07/28/23 - 07/26/24   BIN: [id number] PCN: [id number] ID: [id number] GROUP: [id number] [employer] ID: [id number]   Pharmacy provided with approval and processing information. Patient informed via telephone had to leave a message for the patient -message sent to staff to send in refill since rx is expired and [facility] said he has not gotten it since 08/2022... doesn't appear he has gotten it anywhere else but notes say he takes

## 2023-09-01 ENCOUNTER — Telehealth: Payer: Self-pay

## 2023-09-01 NOTE — Telephone Encounter (Signed)
 Called and spoke with pt and he would like an rx sent in for either Cialis or Viagra due to recently started to date and has increased sex drive but has issues with not getting a full firm erection and not being able to last long.  Copied from CRM [id number]. Topic: Clinical - Medication Question >> Sep 01, 2023 10:32 AM Deaijah H wrote: Reason for CRM: Patient would like to speak with Dr. Lolita Rise nurse to speak regarding Viagra. Please call [phone]

## 2023-09-01 NOTE — Telephone Encounter (Signed)
 Unfortunately Viagra and Cialis are contraindicated while on Imdur .

## 2023-09-01 NOTE — Telephone Encounter (Signed)
Called and lm for pt tcb. 

## 2023-09-02 NOTE — Telephone Encounter (Signed)
 Called and spoke with pt and below message given. Pt would like referral to urology for other options, once pt finds the name of the urologist he will give me a call back and referral will be placed at that time.

## 2023-09-03 ENCOUNTER — Encounter: Payer: Self-pay | Admitting: Podiatrist

## 2023-09-03 ENCOUNTER — Ambulatory Visit (INDEPENDENT_AMBULATORY_CARE_PROVIDER_SITE_OTHER)

## 2023-09-03 ENCOUNTER — Ambulatory Visit (INDEPENDENT_AMBULATORY_CARE_PROVIDER_SITE_OTHER): Admitting: Podiatrist

## 2023-09-03 DIAGNOSIS — E1342 Other specified diabetes mellitus with diabetic polyneuropathy: Secondary | ICD-10-CM | POA: Diagnosis not present

## 2023-09-03 DIAGNOSIS — L603 Nail dystrophy: Secondary | ICD-10-CM | POA: Diagnosis not present

## 2023-09-03 NOTE — Progress Notes (Signed)
 Chief Complaint  Patient presents with   Diabetes    DFC. IDDM A1C 8.0 Pt had Osteomyelitis 5 yrs ago. Wants nail trim. Has neuropathy.Fungal toe nails.     HPI: Patient is 78 y.o. male who presents today for a diabetic foot examination and for trimming of toe  nails.  He has had a non healing ulcer in the past on the left foot/ fifth metatarsal that eventually turnied to osteomyelitis of the left fifth met-  he ws treated at the wound care center where he underwent hyperbaric oxygen .  He was put on a picc line and given iv vancomycin  which ultimately prevented him from having to have an amputation.  He also states his blood flow on the left was poor and he sought the care of interentional radiologists who stented his arteries in his left leg.  He relates he has poor balance on the left foot and wonders if it  could be related to the osteomyelitis.   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 difficile colitis 09/25/2016   History of aspiration pneumonia 09/25/2016   History of skin cancer 08/15/2015   Undiagnosed cardiac murmurs 08/15/2015   Diabetic retinopathy (HCC)  02/21/2015   Former smoker 08/24/2014   Hyperlipidemia associated with type 2 diabetes mellitus (HCC) 08/24/2014   Ingrowing toenail 07/19/2014   Diabetes (HCC) 12/14/2009   Diabetic polyneuropathy (HCC) 04/11/2009   BACK PAIN, LUMBAR, WITH RADICULOPATHY 08/30/2008   Essential hypertension 02/03/2007    Current Outpatient Medications on File Prior to Visit  Medication Sig Dispense Refill   aspirin  81 MG EC tablet Take 1 tablet (81 mg total) by mouth daily.     azelastine  (ASTELIN ) 0.1 % nasal spray Place 1 spray into both nostrils 2 (two) times daily. 30 mL 12   clopidogrel  (PLAVIX ) 75 MG tablet TAKE 1 TABLET(75 MG) BY MOUTH DAILY 90 tablet 3   Continuous Glucose Sensor (FREESTYLE LIBRE 3 PLUS SENSOR) MISC 1 each by Does not apply route every 14 (fourteen) days. 6 each 3   escitalopram  (LEXAPRO ) 5 MG tablet TAKE 1 TABLET(5 MG) BY MOUTH DAILY 90 tablet 3   Evolocumab  (REPATHA  SURECLICK) 140 MG/ML SOAJ Inject 140 mg into the skin every 14 (fourteen) days. 6 mL 3   ezetimibe  (ZETIA ) 10 MG tablet TAKE 1 TABLET BY MOUTH EVERY DAY 90 tablet 3   fluticasone  (FLONASE ) 50 MCG/ACT nasal spray Place 2 sprays into both nostrils daily as needed for allergies or rhinitis.     Fluticasone -Umeclidin-Vilant (TRELEGY ELLIPTA ) 100-62.5-25 MCG/ACT AEPB INHALE 1 PUFF INTO THE LUNGS DAILY 60 each 1   HUMALOG  KWIKPEN 200 UNIT/ML KwikPen ADMINISTER 15 TO 25 UNITS  UNDER THE SKIN THREE TIMES DAILY BEFORE MEALS 12 mL 3   hydrOXYzine (ATARAX) 25 MG tablet Take 25 mg by mouth every 6 (six) hours as needed for anxiety.     insulin  glargine (LANTUS  SOLOSTAR) 100 UNIT/ML Solostar Pen Inject 40 Units into the skin daily. 45 mL 3   Insulin  Pen Needle 31G X 8 MM MISC Use to inject insulin  4 times a day. 400 each 5   isosorbide  mononitrate (IMDUR ) 30 MG 24 hr tablet TAKE 1 TABLET BY MOUTH EVERY DAY 90 tablet 3   losartan  (COZAAR ) 50 MG tablet TAKE 1 TABLET BY MOUTH EVERY MORNING AND AT BEDTIME. 180 tablet 3   metoprolol   succinate (TOPROL  XL) 25 MG 24 hr tablet Take 0.5 tablets (12.5 mg total) by mouth daily. 90 tablet 3   montelukast  (SINGULAIR ) 10 MG tablet Take 1 tablet (10 mg total) by mouth at bedtime. 30 tablet 11   nitroGLYCERIN  (NITROSTAT ) 0.4 MG SL tablet Place 1 tablet (0.4 mg total) under the tongue every 5 (five) minutes as needed for chest pain (if pain does not resolve with 2nd dose- seek care). 20 tablet 5   ondansetron  (ZOFRAN -ODT) 4 MG disintegrating tablet Take 1 tablet (4 mg total) by mouth every 8 (eight) hours as needed for up to 20 doses for nausea or vomiting. 20 tablet 0   ONETOUCH VERIO test strip CHECK LEVELS 4 TIMES DAILY 100 strip 2   Pitavastatin  Calcium  (LIVALO ) 1 MG TABS Take 1 tablet (1 mg total) by mouth daily. 90 tablet 3   torsemide  (DEMADEX ) 20 MG tablet TAKE 1 TABLET BY MOUTH EVERY OTHER DAY FOR ANKLE SWELLING 45 tablet 1   [DISCONTINUED] DAPTOmycin  (CUBICIN ) 500 MG injection Inject into the vein. (Patient not taking: Reported on 10/22/2020)     [DISCONTINUED] DAPTOmycin  350 MG SOLR Inject into the vein. (Patient not taking: No sig reported)     No current facility-administered medications on file prior to visit.    Allergies  Allergen Reactions   Jardiance  [Empagliflozin ] Nausea And Vomiting   Simvastatin  Diarrhea   Statins Other (See Comments)    achey joints   Xanax  [Alprazolam ] Anxiety    Review of Systems No fevers, chills, nausea, muscle aches, no difficulty breathing, no calf pain, no chest pain or shortness of breath.   Physical Exam  GENERAL APPEARANCE: Alert, conversant. Appropriately groomed. No acute distress.   VASCULAR: Pedal pulses palpable 1/4 DP and non palpable PT rigth-  non palpable pulses left.   Capillary refill time is immediate to all digits,  Proximal to distal cooling is warm to warm.  Digital perfusion adequate. Rubor upon dependency noted left over right.   NEUROLOGIC: sensation is decreased to 5.07 monofilament at 0/5 sites bilateral.   Light touch is decreased bilateral, vibratory sensation absent bilateral  MUSCULOSKELETAL: acceptable muscle strength, tone and stability bilateral. Contracture of the left fifth toe noted.  Prominent fifth metatarsal head left noted.   Contracture of toes right and left foot noted.   DERMATOLOGIC: skin is warm, supple, and dry.  Rubor of the left foot compared to the right noted.   No open wounds are noted.  + preulcerative lesion present sub fifth metatarsal- this is the area he had an ulcer and he cares for the area himself -  prefers not to have the area trimmed professionally..  Digital nails are thick, discolored, dystrophic and clinically mycotic. X 10   Xrays:  3 views of the left foot obtained.  Evidence of  prior osteomyelitis of the fifth metatarsal head noted on xray.  Contracture of the fifth digit is seen.  No fractures noted.  Exostosis of calcaneus and fifth metatarsal base noted.     Assessment     ICD-10-CM   1. Diabetic polyneuropathy associated with other specified diabetes mellitus (HCC)  E13.42     2. Nail dystrophy  L60.3 DG Foot Complete Left       Plan  Discussed history as well as xrays and prior issue with osteomyelitis.  In the past he had to seek out his own providers as far as wound care and he sought the care of interventional radiology for the vascular flow.  I recommended he follow up for a blood flow study and he will make an appointment with his preferred interventional radiologist.    I trimmed his nails today without complication-  I did not trim the callus per his request.   Recommended routine visits every 3 months for follow up and recommended Dr. Denece Finger due to his past history.  Would recommend following up on vascular study at next visit if he has not had it done yet.

## 2023-09-16 DIAGNOSIS — H35013 Changes in retinal vascular appearance, bilateral: Secondary | ICD-10-CM | POA: Diagnosis not present

## 2023-09-16 DIAGNOSIS — D3131 Benign neoplasm of right choroid: Secondary | ICD-10-CM | POA: Diagnosis not present

## 2023-09-16 DIAGNOSIS — E113213 Type 2 diabetes mellitus with mild nonproliferative diabetic retinopathy with macular edema, bilateral: Secondary | ICD-10-CM | POA: Diagnosis not present

## 2023-09-16 DIAGNOSIS — H40023 Open angle with borderline findings, high risk, bilateral: Secondary | ICD-10-CM | POA: Diagnosis not present

## 2023-09-16 LAB — HM DIABETES EYE EXAM

## 2023-09-23 ENCOUNTER — Other Ambulatory Visit: Payer: Self-pay | Admitting: General Practice

## 2023-10-14 ENCOUNTER — Encounter: Payer: Self-pay | Admitting: Family Medicine

## 2023-10-14 ENCOUNTER — Ambulatory Visit (INDEPENDENT_AMBULATORY_CARE_PROVIDER_SITE_OTHER): Payer: Medicare Other | Admitting: Family Medicine

## 2023-10-14 VITALS — BP 138/70 | HR 81 | Temp 97.2°F | Ht 72.0 in | Wt 269.4 lb

## 2023-10-14 DIAGNOSIS — E785 Hyperlipidemia, unspecified: Secondary | ICD-10-CM

## 2023-10-14 DIAGNOSIS — Z794 Long term (current) use of insulin: Secondary | ICD-10-CM | POA: Diagnosis not present

## 2023-10-14 DIAGNOSIS — I10 Essential (primary) hypertension: Secondary | ICD-10-CM | POA: Diagnosis not present

## 2023-10-14 DIAGNOSIS — E1169 Type 2 diabetes mellitus with other specified complication: Secondary | ICD-10-CM

## 2023-10-14 DIAGNOSIS — I739 Peripheral vascular disease, unspecified: Secondary | ICD-10-CM

## 2023-10-14 DIAGNOSIS — G720 Drug-induced myopathy: Secondary | ICD-10-CM

## 2023-10-14 DIAGNOSIS — R351 Nocturia: Secondary | ICD-10-CM

## 2023-10-14 DIAGNOSIS — M25472 Effusion, left ankle: Secondary | ICD-10-CM

## 2023-10-14 NOTE — Progress Notes (Signed)
 Phone [phone] In person visit   Subjective:   Martin [name] [facility] Sr. is a 78 y.o. year old very pleasant male patient who presents for/with See problem oriented charting Chief Complaint  Patient presents with   Medical Management of Chronic Issues   Hypertension   Bleeding/Bruising    Pt c/o bruising due to blood thinner   Diabetes    DEE requested   Past Medical History-  Patient Active Problem List   Diagnosis Date Noted   Osteomyelitis of fifth toe of left foot (HCC) 05/08/2020    Priority: High   History of atrial fibrillation- post op 10/24/2018    Priority: High   Coronary artery disease s/p CABG 10/01/2018 after STEMI     Priority: High   Undiagnosed cardiac murmurs 08/15/2015    Priority: High   Diabetes (HCC) 12/14/2009    Priority: High   GAD (generalized anxiety disorder) 08/09/2022    Priority: Medium    Drug-induced myopathy 06/18/2022    Priority: Medium    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    Priority: Medium    PAD (peripheral artery disease) (HCC) 08/20/2017    Priority: Medium    Partial tear of right Achilles tendon 02/12/2017    Priority: Medium    Aortic atherosclerosis (HCC) 10/07/2016    Priority: Medium    History of skin cancer 08/15/2015    Priority: Medium    Diabetic retinopathy (HCC) 02/21/2015    Priority: Medium    Hyperlipidemia associated with type 2 diabetes mellitus (HCC) 08/24/2014    Priority: Medium    Diabetic polyneuropathy (HCC) 04/11/2009    Priority: Medium    Essential hypertension 02/03/2007    Priority: Medium    Acute blood loss as cause of postoperative anemia 10/24/2018    Priority: Low   BPH (benign prostatic hyperplasia) 10/24/2018    Priority: Low   History of depression 10/24/2018    Priority: Low   S/P CABG x 4 10/01/2018    Priority: Low   Onychomycosis of toenail 02/17/2018    Priority: Low   Allergic rhinitis 08/20/2017    Priority: Low   C. difficile colitis 09/25/2016    Priority: Low    History of aspiration pneumonia 09/25/2016    Priority: Low   Former smoker 08/24/2014    Priority: Low   Ingrowing toenail 07/19/2014    Priority: Low   BACK PAIN, LUMBAR, WITH RADICULOPATHY 08/30/2008    Priority: Low   Diabetic ulcer of toe of right foot associated with type 2 diabetes mellitus (HCC) 08/22/2021   Simple chronic bronchitis (HCC) 05/08/2020    Medications- reviewed and updated Current Outpatient Medications  Medication Sig Dispense Refill   aspirin  81 MG EC tablet Take 1 tablet (81 mg total) by mouth daily.     azelastine  (ASTELIN ) 0.1 % nasal spray Place 1 spray into both nostrils 2 (two) times daily. 30 mL 12   clopidogrel  (PLAVIX ) 75 MG tablet TAKE 1 TABLET(75 MG) BY MOUTH DAILY 90 tablet 3   Continuous Glucose Sensor (FREESTYLE LIBRE 3 PLUS SENSOR) MISC 1 each by Does not apply route every 14 (fourteen) days. 6 each 3   escitalopram  (LEXAPRO ) 5 MG tablet TAKE 1 TABLET(5 MG) BY MOUTH DAILY 90 tablet 3   Evolocumab  (REPATHA  SURECLICK) 140 MG/ML SOAJ Inject 140 mg into the skin every 14 (fourteen) days. 6 mL 3   ezetimibe  (ZETIA ) 10 MG tablet TAKE 1 TABLET BY MOUTH EVERY DAY 90 tablet 3   fluticasone  (  FLONASE ) 50 MCG/ACT nasal spray Place 2 sprays into both nostrils daily as needed for allergies or rhinitis.     Fluticasone -Umeclidin-Vilant (TRELEGY ELLIPTA ) 100-62.5-25 MCG/ACT AEPB INHALE 1 PUFF INTO THE LUNGS DAILY 60 each 1   HUMALOG  KWIKPEN 200 UNIT/ML KwikPen ADMINISTER 15 TO 25 UNITS UNDER THE SKIN THREE TIMES DAILY BEFORE MEALS 12 mL 3   hydrOXYzine (ATARAX) 25 MG tablet Take 25 mg by mouth every 6 (six) hours as needed for anxiety.     insulin  glargine (LANTUS  SOLOSTAR) 100 UNIT/ML Solostar Pen Inject 40 Units into the skin daily. 45 mL 3   Insulin  Pen Needle 31G X 8 MM MISC Use to inject insulin  4 times a day. 400 each 5   isosorbide  mononitrate (IMDUR ) 30 MG 24 hr tablet TAKE 1 TABLET BY MOUTH EVERY DAY 90 tablet 3   losartan  (COZAAR ) 50 MG tablet TAKE 1  TABLET BY MOUTH EVERY MORNING AND AT BEDTIME. 180 tablet 3   metoprolol  succinate (TOPROL  XL) 25 MG 24 hr tablet Take 0.5 tablets (12.5 mg total) by mouth daily. 90 tablet 3   montelukast  (SINGULAIR ) 10 MG tablet Take 1 tablet (10 mg total) by mouth at bedtime. 30 tablet 11   nitroGLYCERIN  (NITROSTAT ) 0.4 MG SL tablet Place 1 tablet (0.4 mg total) under the tongue every 5 (five) minutes as needed for chest pain (if pain does not resolve with 2nd dose- seek care). 20 tablet 5   ondansetron  (ZOFRAN -ODT) 4 MG disintegrating tablet Take 1 tablet (4 mg total) by mouth every 8 (eight) hours as needed for up to 20 doses for nausea or vomiting. 20 tablet 0   ONETOUCH VERIO test strip CHECK LEVELS 4 TIMES DAILY 100 strip 2   torsemide  (DEMADEX ) 20 MG tablet TAKE 1 TABLET BY MOUTH EVERY OTHER DAY FOR ANKLE SWELLING 45 tablet 1   Pitavastatin  Calcium  (LIVALO ) 1 MG TABS Take 1 tablet (1 mg total) by mouth daily. (Patient not taking: Reported on 10/14/2023) 90 tablet 3   No current facility-administered medications for this visit.     Objective:  BP 138/70   Pulse 81   Temp (!) 97.2 F (36.2 C)   Ht 6' (1.829 m)   Wt 269 lb 6.4 oz (122.2 kg)   SpO2 96%   BMI 36.54 kg/m  Gen: NAD, resting comfortably CV: RRR no murmurs rubs or gallops Lungs: CTAB no crackles, wheeze, rhonchi Ext: no edema on right, left ankle with 1+ edema and some mild erythema without warmth. No calf pain Skin: warm, dry     Assessment and Plan   #CAD- no chest pain or shortness of breath above baseline  #Peripheral arterial disease- history of angioplasty in February 2022.  Statin myalgias #hyperlipidemia-with LDL goal under 70 particular with aortic atherosclerosis  S: Medication: Aspirin  81 mg, Plavix  75 mg (easy bruising noted), Imdur  30 mg -Zetia  10 mg daily still -  Livalo  1 mg not covered- myalgias/arthalgias on all other options -Repatha  not affordable lately and has not been able to get grant Lab Results  Component  Value Date   CHOL 136 01/17/2023   HDL 46 01/17/2023   LDLCALC 74 01/17/2023   LDLDIRECT 173.0 07/01/2018   TRIG 80 01/17/2023   CHOLHDL 3.0 01/17/2023  A/P: coronary artery disease largely asymptomatic - continue current medications Lipids suspect mild poor control- may refer to lipid clinic within cardiology to see if they can help us  - LDL goal under 70 at least, 55 would be even better For  peripheral arterial disease no pain in left thigh or calf/no claudication ut notes more discolaration around the left ankle with more swelling than the right- will rule out DVT and he's also overdue for follow up with Dr. Jinx Mourning so refer back  -also perhaps may help if goes back to every other day lasix  short term    #Screen prostate cancer- prefers to have PSA checked again- last 2017- advised potential risks. Nocturia will be used to check -sees Dr. Dulcy Gibney next week for regular follow up  for erectile dysfunction (ED) and we could get his opinion if PSA up. He has been dating and its an issue for him.   #chronic cough- trelegy not affordable lately but cough generally not worse lately.   # Diabetes-managed by endocrinology Dr. Aldona Amel (prior Dr. Washington Hacker)- a1c above goal but trending down on insulin  and with adjustments from Dr. Aldona Amel Lab Results  Component Value Date   HGBA1C 8.4 (A) 08/26/2023   HGBA1C 8.9 (H) 04/15/2023   HGBA1C 7.9 (A) 07/22/2022   #microalbuminuria- noted at 334 most recently but actually slightly improved from August 2023 when adjusting for prior lab error would have been 394. I tihnk if we can get a1c down that will help. Already on max dose losartan  and doesn't tolerate jardiance  at all  #hypertension S: medication: Losartan  50 mg twice daily, imdur  30mg , torsemide  20 mg-cardiology has recommended every other day but does at least twice a week- has to stay in house -he stopped metoprolol  thinking it caused anxiety in 2024 but later did low dose 12.5 mg ER (and had  palpitatoins and stopped)  BP Readings from Last 3 Encounters:  10/14/23 138/70  08/26/23 120/70  04/21/23 (!) 140/76  A/P: blood pressure high acceptable. I'm going to have him try torsemide  every other day for a few days to see if helps with swelling but his weight is trending down so not sure this is the issue  Recommended follow up: Return in about 6 months (around 04/14/2024) for followup or sooner if needed.Schedule b4 you leave. Future Appointments  Date Time Provider Department Center  11/05/2023 10:00 AM Luella Sager, [place] [facility]  12/30/2023  8:20 AM Emilie Harden, [place] [facility] None    Lab/Order associations:   ICD-10-CM   1. Essential hypertension  I10     2. Hyperlipidemia associated with type 2 diabetes mellitus (HCC)  E11.69 Comprehensive metabolic panel with GFR   E78.5 Lipid panel    CBC with Differential/Platelet    3. Drug-induced myopathy  G72.0     4. Nocturia  R35.1 PSA    5. Edema of left ankle  M25.472 US  Venous Img Lower Bilateral (DVT)    6. PAD (peripheral artery disease) (HCC)  I73.9 IR Radiologist Eval & Mgmt      No orders of the defined types were placed in this encounter.   Return precautions advised.  Clarisa Crooked, MD

## 2023-10-14 NOTE — Patient Instructions (Addendum)
 Trying to get venous duplex of left leg as soon as possible through [facility] but if leg has more swelling or calf pain or any shortness of breath seek care immediately  Also refer back to Dr. Jinx Mourning- call them if you do not hear within a week  Please stop by lab before you go If you have mychart- we will send your results within 3 business days of us  receiving them.  If you do not have mychart- we will call you about results within 5 business days of us  receiving them.  *please also note that you will see labs on mychart as soon as they post. I will later go in and write notes on them- will say notes from Dr. Arlene Ben   Recommended follow up: Return in about 6 months (around 04/14/2024) for followup or sooner if needed.Schedule b4 you leave.

## 2023-10-15 ENCOUNTER — Other Ambulatory Visit: Payer: Self-pay | Admitting: Family Medicine

## 2023-10-15 ENCOUNTER — Ambulatory Visit: Payer: Self-pay | Admitting: Family Medicine

## 2023-10-15 LAB — COMPREHENSIVE METABOLIC PANEL WITH GFR
AG Ratio: 1.7 (calc) (ref 1.0–2.5)
ALT: 15 U/L (ref 9–46)
AST: 16 U/L (ref 10–35)
Albumin: 4.3 g/dL (ref 3.6–5.1)
Alkaline phosphatase (APISO): 45 U/L (ref 35–144)
BUN: 22 mg/dL (ref 7–25)
CO2: 24 mmol/L (ref 20–32)
Calcium: 9.1 mg/dL (ref 8.6–10.3)
Chloride: 101 mmol/L (ref 98–110)
Creat: 1.23 mg/dL (ref 0.70–1.28)
Globulin: 2.5 g/dL (ref 1.9–3.7)
Glucose, Bld: 230 mg/dL — ABNORMAL HIGH (ref 65–99)
Potassium: 4.7 mmol/L (ref 3.5–5.3)
Sodium: 137 mmol/L (ref 135–146)
Total Bilirubin: 1.2 mg/dL (ref 0.2–1.2)
Total Protein: 6.8 g/dL (ref 6.1–8.1)
eGFR: 60 mL/min/{1.73_m2} (ref >=60)

## 2023-10-15 LAB — CBC WITH DIFFERENTIAL/PLATELET
Absolute Lymphocytes: 1676 {cells}/uL (ref 850–3900)
Absolute Monocytes: 525 {cells}/uL (ref 200–950)
Basophils Absolute: 7 {cells}/uL (ref 0–200)
Basophils Relative: 0.1 %
Eosinophils Absolute: 78 {cells}/uL (ref 15–500)
Eosinophils Relative: 1.1 %
HCT: 42.2 % (ref 38.5–50.0)
Hemoglobin: 13.6 g/dL (ref 13.2–17.1)
MCH: 30.6 pg (ref 27.0–33.0)
MCHC: 32.2 g/dL (ref 32.0–36.0)
MCV: 95 fL (ref 80.0–100.0)
MPV: 10 fL (ref 7.5–12.5)
Monocytes Relative: 7.4 %
Neutro Abs: 4814 {cells}/uL (ref 1500–7800)
Neutrophils Relative %: 67.8 %
Platelets: 252 10*3/uL (ref 140–400)
RBC: 4.44 10*6/uL (ref 4.20–5.80)
RDW: 13.7 % (ref 11.0–15.0)
Total Lymphocyte: 23.6 %
WBC: 7.1 10*3/uL (ref 3.8–10.8)

## 2023-10-15 LAB — LIPID PANEL
Cholesterol: 162 mg/dL (ref <200)
HDL: 50 mg/dL (ref >=40)
LDL Cholesterol (Calc): 90 mg/dL
Non-HDL Cholesterol (Calc): 112 mg/dL (ref <130)
Total CHOL/HDL Ratio: 3.2 (calc) (ref <5.0)
Triglycerides: 123 mg/dL (ref <150)

## 2023-10-15 LAB — PSA: PSA: 0.5 ng/mL (ref <=4.00)

## 2023-10-20 ENCOUNTER — Other Ambulatory Visit: Payer: Self-pay | Admitting: Family Medicine

## 2023-10-20 ENCOUNTER — Ambulatory Visit
Admission: RE | Admit: 2023-10-20 | Discharge: 2023-10-20 | Disposition: A | Discharge status: Home / Self Care | Source: Ambulatory Visit | Attending: Family Medicine | Admitting: Family Medicine

## 2023-10-20 DIAGNOSIS — M25472 Effusion, left ankle: Secondary | ICD-10-CM

## 2023-10-20 DIAGNOSIS — R600 Localized edema: Secondary | ICD-10-CM | POA: Diagnosis not present

## 2023-10-20 DIAGNOSIS — N5201 Erectile dysfunction due to arterial insufficiency: Secondary | ICD-10-CM | POA: Diagnosis not present

## 2023-10-28 ENCOUNTER — Other Ambulatory Visit: Payer: Self-pay

## 2023-10-28 DIAGNOSIS — E1169 Type 2 diabetes mellitus with other specified complication: Secondary | ICD-10-CM

## 2023-10-29 DIAGNOSIS — R45851 Suicidal ideations: Secondary | ICD-10-CM | POA: Diagnosis not present

## 2023-10-29 DIAGNOSIS — Z794 Long term (current) use of insulin: Secondary | ICD-10-CM | POA: Diagnosis not present

## 2023-10-29 DIAGNOSIS — Y904 Blood alcohol level of 80-99 mg/100 ml: Secondary | ICD-10-CM | POA: Diagnosis not present

## 2023-10-29 DIAGNOSIS — F10120 Alcohol abuse with intoxication, uncomplicated: Secondary | ICD-10-CM | POA: Diagnosis not present

## 2023-10-29 DIAGNOSIS — E1140 Type 2 diabetes mellitus with diabetic neuropathy, unspecified: Secondary | ICD-10-CM | POA: Diagnosis not present

## 2023-10-29 DIAGNOSIS — Z79899 Other long term (current) drug therapy: Secondary | ICD-10-CM | POA: Diagnosis not present

## 2023-10-29 DIAGNOSIS — Z7902 Long term (current) use of antithrombotics/antiplatelets: Secondary | ICD-10-CM | POA: Diagnosis not present

## 2023-10-29 DIAGNOSIS — Z634 Disappearance and death of family member: Secondary | ICD-10-CM | POA: Diagnosis not present

## 2023-10-29 DIAGNOSIS — I4891 Unspecified atrial fibrillation: Secondary | ICD-10-CM | POA: Diagnosis not present

## 2023-10-29 DIAGNOSIS — Z9861 Coronary angioplasty status: Secondary | ICD-10-CM | POA: Diagnosis not present

## 2023-10-29 DIAGNOSIS — Z638 Other specified problems related to primary support group: Secondary | ICD-10-CM | POA: Diagnosis not present

## 2023-10-29 DIAGNOSIS — I2510 Atherosclerotic heart disease of native coronary artery without angina pectoris: Secondary | ICD-10-CM | POA: Diagnosis not present

## 2023-10-29 DIAGNOSIS — Z87891 Personal history of nicotine dependence: Secondary | ICD-10-CM | POA: Diagnosis not present

## 2023-10-29 DIAGNOSIS — I10 Essential (primary) hypertension: Secondary | ICD-10-CM | POA: Diagnosis not present

## 2023-10-30 DIAGNOSIS — Y904 Blood alcohol level of 80-99 mg/100 ml: Secondary | ICD-10-CM | POA: Diagnosis not present

## 2023-10-30 DIAGNOSIS — F4320 Adjustment disorder, unspecified: Secondary | ICD-10-CM | POA: Diagnosis not present

## 2023-10-30 DIAGNOSIS — F411 Generalized anxiety disorder: Secondary | ICD-10-CM | POA: Diagnosis not present

## 2023-10-30 DIAGNOSIS — Z638 Other specified problems related to primary support group: Secondary | ICD-10-CM | POA: Diagnosis not present

## 2023-10-30 DIAGNOSIS — Z7902 Long term (current) use of antithrombotics/antiplatelets: Secondary | ICD-10-CM | POA: Diagnosis not present

## 2023-10-30 DIAGNOSIS — E1140 Type 2 diabetes mellitus with diabetic neuropathy, unspecified: Secondary | ICD-10-CM | POA: Diagnosis not present

## 2023-10-30 DIAGNOSIS — Z9861 Coronary angioplasty status: Secondary | ICD-10-CM | POA: Diagnosis not present

## 2023-10-30 DIAGNOSIS — Z794 Long term (current) use of insulin: Secondary | ICD-10-CM | POA: Diagnosis not present

## 2023-10-30 DIAGNOSIS — F10120 Alcohol abuse with intoxication, uncomplicated: Secondary | ICD-10-CM | POA: Diagnosis not present

## 2023-10-30 DIAGNOSIS — I2510 Atherosclerotic heart disease of native coronary artery without angina pectoris: Secondary | ICD-10-CM | POA: Diagnosis not present

## 2023-10-30 DIAGNOSIS — R45851 Suicidal ideations: Secondary | ICD-10-CM | POA: Diagnosis not present

## 2023-10-30 DIAGNOSIS — Z87891 Personal history of nicotine dependence: Secondary | ICD-10-CM | POA: Diagnosis not present

## 2023-10-30 DIAGNOSIS — Z79899 Other long term (current) drug therapy: Secondary | ICD-10-CM | POA: Diagnosis not present

## 2023-10-30 DIAGNOSIS — I10 Essential (primary) hypertension: Secondary | ICD-10-CM | POA: Diagnosis not present

## 2023-10-30 DIAGNOSIS — I4891 Unspecified atrial fibrillation: Secondary | ICD-10-CM | POA: Diagnosis not present

## 2023-10-30 DIAGNOSIS — F4321 Adjustment disorder with depressed mood: Secondary | ICD-10-CM | POA: Diagnosis not present

## 2023-10-30 DIAGNOSIS — Z634 Disappearance and death of family member: Secondary | ICD-10-CM | POA: Diagnosis not present

## 2023-11-01 DIAGNOSIS — F4320 Adjustment disorder, unspecified: Secondary | ICD-10-CM | POA: Diagnosis not present

## 2023-11-05 ENCOUNTER — Ambulatory Visit (INDEPENDENT_AMBULATORY_CARE_PROVIDER_SITE_OTHER): Admitting: Podiatry

## 2023-11-05 DIAGNOSIS — Z538 Procedure and treatment not carried out for other reasons: Secondary | ICD-10-CM

## 2023-11-05 NOTE — Progress Notes (Signed)
 1. Appointment canceled by hospital

## 2023-11-08 DIAGNOSIS — H1045 Other chronic allergic conjunctivitis: Secondary | ICD-10-CM | POA: Diagnosis not present

## 2023-11-10 ENCOUNTER — Telehealth: Payer: Self-pay | Admitting: Family Medicine

## 2023-11-10 NOTE — Telephone Encounter (Signed)
 See below. Willing to work pt in one day?

## 2023-11-10 NOTE — Telephone Encounter (Signed)
 Can he come in at 4 PM today?  It may be tight otherwise as I am off on Friday and do not have p.m. appointments on Wednesday.  It would be a work in so he may have to wait just so he knows

## 2023-11-10 NOTE — Telephone Encounter (Signed)
 Called pt to reschedule appt. Pt states he was told to be seen within 7 days of D/C. He is asking if Katrinka could work him in sometime this week in the afternoon. There are no available openings. Please advise

## 2023-11-10 NOTE — Telephone Encounter (Signed)
 Unfortunately, pt can only come in the afternoon. He is coming from [place].

## 2023-11-12 ENCOUNTER — Inpatient Hospital Stay: Admitting: Family Medicine

## 2023-11-13 ENCOUNTER — Inpatient Hospital Stay: Admitting: Family Medicine

## 2023-11-18 ENCOUNTER — Encounter: Payer: Self-pay | Admitting: Family Medicine

## 2023-11-18 ENCOUNTER — Ambulatory Visit (INDEPENDENT_AMBULATORY_CARE_PROVIDER_SITE_OTHER): Admitting: Family Medicine

## 2023-11-18 VITALS — BP 138/70 | HR 96 | Temp 97.2°F | Ht 72.0 in | Wt 256.4 lb

## 2023-11-18 DIAGNOSIS — I10 Essential (primary) hypertension: Secondary | ICD-10-CM

## 2023-11-18 DIAGNOSIS — F411 Generalized anxiety disorder: Secondary | ICD-10-CM

## 2023-11-18 DIAGNOSIS — F4322 Adjustment disorder with anxiety: Secondary | ICD-10-CM | POA: Diagnosis not present

## 2023-11-18 MED ORDER — TRAZODONE HCL 50 MG PO TABS: 25.0000 mg | ORAL_TABLET | Freq: Every evening | ORAL | 3 refills | Status: AC | PRN

## 2023-11-18 NOTE — Patient Instructions (Addendum)
 Taking the medicine escitalopram /Lexapro  5mg  as directed and not missing any doses is one of the best things you can do to treat your anxiety- which can present as irritability.  Here are some things to keep in mind:  Side effects (stomach upset, some increased anxiety) may happen before you notice a benefit.  These side effects typically go away over time. Changes to your dose of medicine or a change in medication all together is sometimes necessary Most people need to be on medication at least 6-12 months Many people will notice an improvement within two weeks but the full effect of the medication can take up to 6 weeks Stopping the medication when you start feeling better often results in a return of symptoms If you start having thoughts of hurting yourself or others after starting this medicine, call our office immediately at [phone] or seek care through 911.    Trazodone  retrial for sleep- can use just as needed. If prolonged erection over 4 hours seek care and stop medicine  Sign release of information at the check out desk for records from behavioral health hospital  Recommended follow up: Return for as needed for new, worsening, persistent symptoms.

## 2023-11-18 NOTE — Progress Notes (Signed)
 Phone [phone] In person visit   Subjective:   Martin JAROSZ Sr. is a 78 y.o. year old very pleasant male patient who presents for/with See problem oriented charting Chief Complaint  Patient presents with   behavioral health f/u   Past Medical History-  Patient Active Problem List   Diagnosis Date Noted   Osteomyelitis of fifth toe of left foot (HCC) 05/08/2020    Priority: High   History of atrial fibrillation- post op 10/24/2018    Priority: High   Coronary artery disease s/p CABG 10/01/2018 after STEMI     Priority: High   Undiagnosed cardiac murmurs 08/15/2015    Priority: High   Diabetes (HCC) 12/14/2009    Priority: High   GAD (generalized anxiety disorder) 08/09/2022    Priority: Medium    Drug-induced myopathy 06/18/2022    Priority: Medium    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    Priority: Medium    PAD (peripheral artery disease) (HCC) 08/20/2017    Priority: Medium    Partial tear of right Achilles tendon 02/12/2017    Priority: Medium    Aortic atherosclerosis (HCC) 10/07/2016    Priority: Medium    History of skin cancer 08/15/2015    Priority: Medium    Diabetic retinopathy (HCC) 02/21/2015    Priority: Medium    Hyperlipidemia associated with type 2 diabetes mellitus (HCC) 08/24/2014    Priority: Medium    Diabetic polyneuropathy (HCC) 04/11/2009    Priority: Medium    Essential hypertension 02/03/2007    Priority: Medium    Acute blood loss as cause of postoperative anemia 10/24/2018    Priority: Low   BPH (benign prostatic hyperplasia) 10/24/2018    Priority: Low   History of depression 10/24/2018    Priority: Low   S/P CABG x 4 10/01/2018    Priority: Low   Onychomycosis of toenail 02/17/2018    Priority: Low   Allergic rhinitis 08/20/2017    Priority: Low   C. difficile colitis 09/25/2016    Priority: Low   History of aspiration pneumonia 09/25/2016    Priority: Low   Former smoker 08/24/2014    Priority: Low   Ingrowing  toenail 07/19/2014    Priority: Low   BACK PAIN, LUMBAR, WITH RADICULOPATHY 08/30/2008    Priority: Low   Diabetic ulcer of toe of right foot associated with type 2 diabetes mellitus (HCC) 08/22/2021   Simple chronic bronchitis (HCC) 05/08/2020    Medications- reviewed and updated Current Outpatient Medications  Medication Sig Dispense Refill   aspirin  81 MG EC tablet Take 1 tablet (81 mg total) by mouth daily.     azelastine  (ASTELIN ) 0.1 % nasal spray Place 1 spray into both nostrils 2 (two) times daily. 30 mL 12   clopidogrel  (PLAVIX ) 75 MG tablet TAKE 1 TABLET(75 MG) BY MOUTH DAILY 90 tablet 3   Continuous Glucose Sensor (FREESTYLE LIBRE 3 PLUS SENSOR) MISC 1 each by Does not apply route every 14 (fourteen) days. 6 each 3   escitalopram  (LEXAPRO ) 5 MG tablet TAKE 1 TABLET(5 MG) BY MOUTH DAILY 90 tablet 3   Evolocumab  (REPATHA  SURECLICK) 140 MG/ML SOAJ Inject 140 mg into the skin every 14 (fourteen) days. 6 mL 3   fluticasone  (FLONASE ) 50 MCG/ACT nasal spray Place 2 sprays into both nostrils daily as needed for allergies or rhinitis.     Fluticasone -Umeclidin-Vilant (TRELEGY ELLIPTA ) 100-62.5-25 MCG/ACT AEPB INHALE 1 PUFF INTO THE LUNGS DAILY 60 each 1   HUMALOG  KWIKPEN 200  UNIT/ML KwikPen ADMINISTER 15 TO 25 UNITS UNDER THE SKIN THREE TIMES DAILY BEFORE MEALS 12 mL 3   hydrOXYzine (ATARAX) 25 MG tablet Take 25 mg by mouth every 6 (six) hours as needed for anxiety.     insulin  glargine (LANTUS  SOLOSTAR) 100 UNIT/ML Solostar Pen Inject 40 Units into the skin daily. 45 mL 3   Insulin  Pen Needle 31G X 8 MM MISC Use to inject insulin  4 times a day. 400 each 5   isosorbide  mononitrate (IMDUR ) 30 MG 24 hr tablet TAKE 1 TABLET BY MOUTH EVERY DAY 90 tablet 3   losartan  (COZAAR ) 50 MG tablet TAKE 1 TABLET BY MOUTH EVERY MORNING AND AT BEDTIME. 180 tablet 3   metoprolol  succinate (TOPROL  XL) 25 MG 24 hr tablet Take 0.5 tablets (12.5 mg total) by mouth daily. 90 tablet 3   montelukast  (SINGULAIR )  10 MG tablet Take 1 tablet (10 mg total) by mouth at bedtime. 30 tablet 11   nitroGLYCERIN  (NITROSTAT ) 0.4 MG SL tablet Place 1 tablet (0.4 mg total) under the tongue every 5 (five) minutes as needed for chest pain (if pain does not resolve with 2nd dose- seek care). 20 tablet 5   ondansetron  (ZOFRAN -ODT) 4 MG disintegrating tablet Take 1 tablet (4 mg total) by mouth every 8 (eight) hours as needed for up to 20 doses for nausea or vomiting. 20 tablet 0   ONETOUCH VERIO test strip CHECK LEVELS 4 TIMES DAILY 100 strip 2   Pitavastatin  Calcium  (LIVALO ) 1 MG TABS Take 1 tablet (1 mg total) by mouth daily. 90 tablet 3   torsemide  (DEMADEX ) 20 MG tablet TAKE 1 TABLET BY MOUTH EVERY OTHER DAY FOR ANKLE SWELLING 45 tablet 1   traZODone  (DESYREL ) 50 MG tablet Take 0.5-1 tablets (25-50 mg total) by mouth at bedtime as needed for sleep. 30 tablet 3   ezetimibe  (ZETIA ) 10 MG tablet TAKE 1 TABLET BY MOUTH EVERY DAY 90 tablet 3   No current facility-administered medications for this visit.     Objective:  BP 138/70   Pulse 96   Temp (!) 97.2 F (36.2 C)   Ht 6' (1.829 m)   Wt 256 lb 6.4 oz (116.3 kg)   SpO2 95%   BMI 34.77 kg/m  Gen: NAD, resting comfortably CV: Stable murmur from known aortic stenosis Lungs: CTAB no crackles, wheeze, rhonchi Ext: no edema Skin: warm, dry Neuro: grossly normal, moves all extremities     Assessment and Plan    # Behavioral health hospitalization follow-up for adjustment disorder in patient with baseline generalized anxiety disorder S: Patient was involuntarily committed by [facility].  Admitted to [facility] emergency department in [place] .  Psychiatry recommended inpatient hospitalization.  He was diagnosed with adjustment disorder F43.20 -report was that patient plan to commit suicide with a plan to shoot himself with a gun.  His UDS was negative.  Blood alcohol level was not substantially elevated at 91 with normal being less than 300-had several  martinis and glass of wine he reports with dinner.  No other substance abuse history.  By time of emergency department evaluation patient was cooperative with normal range affect and irritable mood.  By the time of evaluation emergency department patient had no suicidal ideation, homicidal ideation or concern for any other self injures behaviors. -A lot of issues per psychiatrist noted around family dissatisfaction with his choice today and marijuana and 30 years younger than he has and he felt sons were verbally aggressive and also  that they had potential secondary gain from him not getting married- they came into the bedroom for the conversation and he thought his space was invaded.  He reported intent to pick up the gun primarily to get everyone away from him- he denies ever saying he was going to commit suicide- but states that is what was reported.   There was consideration for discharge but family was concerned about safety and recommended to continue to hold and he sought inpatient psychiatric hospitalization under involuntary commitment.  Labs were stable-started on trazodone  for sleep disturbance, Lexapro  for anxiety, Haldol available if needed for agitation . He was in the hospital for 7 years. He still feels the main issue is the family pressure about his fiancee. States has 100k policy life insurance that he pays $700 a month for and he would not harm himself.   Reports discharged with no new medicine-he thinks he may have missed some doses of Lexapro  5 mg previously but is agreeable to restart, trazodone  helped him with sleep in hospital and would like to trial outpatient but reports he was not discharged with it  He reports his frustration remains primarily with family and her lack of desire for his current relationship and his concern that they could have secondary financial gain by blocking relationship.  He reiterates no thoughts of self-harm and states he brought the gun out to create space  for himself-I had a rather firm discussion with him that is never acceptable-if he feels threatened he can always call 911 which is a much safer option    11/18/2023   12:11 PM 04/15/2023    8:42 AM 10/01/2022    4:26 PM 08/09/2022   10:52 AM  GAD 7 : Generalized Anxiety Score  Nervous, Anxious, on Edge 0 0 1 1  Control/stop worrying 0 0 2 3  Worry too much - different things 0 1 2 3   Trouble relaxing 0 0 1 2  Restless 0 0 0 1  Easily annoyed or irritable 0 0 0 1  Afraid - awful might happen 0 0 1 1  Total GAD 7 Score 0 1 7 12   Anxiety Difficulty Not difficult at all Not difficult at all Somewhat difficult Very difficult       11/18/2023   12:10 PM 04/15/2023    8:41 AM 10/01/2022    4:26 PM  Depression screen PHQ 2/9  Decreased Interest 0 0 0  Down, Depressed, Hopeless 0 0 0  PHQ - 2 Score 0 0 0  Altered sleeping 1 0 2  Tired, decreased energy 0 0 0  Change in appetite 0 0 0  Feeling bad or failure about yourself  0 0 0  Trouble concentrating 0 0 0  Moving slowly or fidgety/restless 0 0 0  Suicidal thoughts 0 0 0  PHQ-9 Score 1 0 2  Difficult doing work/chores Not difficult at all Not difficult at all Not difficult at all   A/P: Patient with baseline generalized anxiety disorder (likely with situational element as most of this started after losing his wife in 2023) due to the possibility that he missed some doses of Lexapro  further heightening anxiety/irritability also in context of adjustment disorder with strained relationships in his family revolving around new fianc/much younger fianc.  Patient had reported potential risk for conflict in the before the beach trip when he seems in a very similar state of mind today - He does agree to take Lexapro  consistently for generalized anxiety disorder we discussed  this may help with irritability-I sent this in.  No current suicidal ideation we discussed calling 911 if this were to occur. -She would like Trazodone  on hand if needed for  sleep-feels like his sleep has been worse with family strain recently - Hopefully in the long run that family relationships can be healed as I know he loves his family deeply and it seems his family deeply cares for him as well.  He is hoping they can help him out more-one of his greatest concerns is not having support/care that he needs as he ages  #hypertension S: medication: Losartan  50 mg twice daily, imdur  30mg , torsemide  20 mg-cardiology has recommended every other day -he stopped metoprolol  thinking it caused anxiety in 2024 but later did low dose 12.5 mg ER A/P: Blood pressure well-controlled or at least high acceptable-continue current medication  Recommended follow up: Return for as needed for new, worsening, persistent symptoms. Future Appointments  Date Time Provider Department Center  12/30/2023  8:20 AM Trixie File, MD [facility] None  04/14/2024  8:20 AM Katrinka Garnette KIDD, MD [facility]    Lab/Order associations:   ICD-10-CM   1. GAD (generalized anxiety disorder)  F41.1     2. Adjustment disorder with anxious mood  F43.22     3. Essential hypertension  I10       Meds ordered this encounter  Medications   traZODone  (DESYREL ) 50 MG tablet    Sig: Take 0.5-1 tablets (25-50 mg total) by mouth at bedtime as needed for sleep.    Dispense:  30 tablet    Refill:  3    Return precautions advised.  Garnette Katrinka, MD

## 2023-12-22 DIAGNOSIS — I4892 Unspecified atrial flutter: Secondary | ICD-10-CM | POA: Diagnosis not present

## 2023-12-22 DIAGNOSIS — Z7982 Long term (current) use of aspirin: Secondary | ICD-10-CM | POA: Diagnosis not present

## 2023-12-22 DIAGNOSIS — I2510 Atherosclerotic heart disease of native coronary artery without angina pectoris: Secondary | ICD-10-CM | POA: Diagnosis not present

## 2023-12-22 DIAGNOSIS — I4891 Unspecified atrial fibrillation: Secondary | ICD-10-CM | POA: Diagnosis not present

## 2023-12-22 DIAGNOSIS — R5383 Other fatigue: Secondary | ICD-10-CM | POA: Diagnosis not present

## 2023-12-22 DIAGNOSIS — M6281 Muscle weakness (generalized): Secondary | ICD-10-CM | POA: Diagnosis not present

## 2023-12-22 DIAGNOSIS — N179 Acute kidney failure, unspecified: Secondary | ICD-10-CM | POA: Diagnosis not present

## 2023-12-22 DIAGNOSIS — R42 Dizziness and giddiness: Secondary | ICD-10-CM | POA: Diagnosis not present

## 2023-12-22 DIAGNOSIS — R918 Other nonspecific abnormal finding of lung field: Secondary | ICD-10-CM | POA: Diagnosis not present

## 2023-12-22 DIAGNOSIS — E860 Dehydration: Secondary | ICD-10-CM | POA: Diagnosis not present

## 2023-12-22 DIAGNOSIS — Z951 Presence of aortocoronary bypass graft: Secondary | ICD-10-CM | POA: Diagnosis not present

## 2023-12-22 DIAGNOSIS — R059 Cough, unspecified: Secondary | ICD-10-CM | POA: Diagnosis not present

## 2023-12-22 DIAGNOSIS — Z79899 Other long term (current) drug therapy: Secondary | ICD-10-CM | POA: Diagnosis not present

## 2023-12-22 DIAGNOSIS — Z794 Long term (current) use of insulin: Secondary | ICD-10-CM | POA: Diagnosis not present

## 2023-12-22 DIAGNOSIS — N183 Chronic kidney disease, stage 3 unspecified: Secondary | ICD-10-CM | POA: Diagnosis not present

## 2023-12-22 DIAGNOSIS — E1122 Type 2 diabetes mellitus with diabetic chronic kidney disease: Secondary | ICD-10-CM | POA: Diagnosis not present

## 2023-12-22 DIAGNOSIS — I129 Hypertensive chronic kidney disease with stage 1 through stage 4 chronic kidney disease, or unspecified chronic kidney disease: Secondary | ICD-10-CM | POA: Diagnosis not present

## 2023-12-22 DIAGNOSIS — J069 Acute upper respiratory infection, unspecified: Secondary | ICD-10-CM | POA: Diagnosis not present

## 2023-12-22 DIAGNOSIS — I959 Hypotension, unspecified: Secondary | ICD-10-CM | POA: Diagnosis not present

## 2023-12-23 DIAGNOSIS — E860 Dehydration: Secondary | ICD-10-CM | POA: Diagnosis not present

## 2023-12-25 ENCOUNTER — Telehealth: Payer: Self-pay | Admitting: Family Medicine

## 2023-12-25 ENCOUNTER — Telehealth: Payer: Self-pay

## 2023-12-25 ENCOUNTER — Inpatient Hospital Stay: Admitting: Family Medicine

## 2023-12-25 NOTE — Transitions of Care (Post Inpatient/ED Visit) (Signed)
 12/25/2023  Name: Martin [name] [facility] Sr. MRN: [id number] DOB: Jul 20, 1945  Today's TOC FU Call Status: Today's TOC FU Call Status:: Successful TOC FU Call Completed TOC FU Call Complete Date: 12/25/23 Patient's Name and Date of Birth confirmed.  Transition Care Management Follow-up Telephone Call Date of Discharge: 12/24/23 Discharge Facility: Other ([facility]) Name of [facility] ([facility]) [facility] Type of Discharge: Inpatient Admission Primary Inpatient Discharge Diagnosis:: dehydration How have you been since you were released from the hospital?: Better Any questions or concerns?: No  Items Reviewed: Did you receive and understand the discharge instructions provided?: Yes Medications obtained,verified, and reconciled?: Yes (Medications Reviewed) Any new allergies since your discharge?: No Dietary orders reviewed?: Yes Do you have support at home?: No  Medications Reviewed Today: Medications Reviewed Today     Reviewed by Emmitt Pan, LPN (Licensed Practical Nurse) on 12/25/23 at 1406  Med List Status: <None>   Medication Order Taking? Sig Documenting Provider Last Dose Status Informant  aspirin  81 MG EC tablet [id number] Yes Take 1 tablet (81 mg total) by mouth daily. Burnard Debby LABOR, MD  Active Self  azelastine  (ASTELIN ) 0.1 % nasal spray [id number] Yes Place 1 spray into both nostrils 2 (two) times daily. Katrinka Garnette KIDD, MD  Active   clopidogrel  (PLAVIX ) 75 MG tablet [id number] Yes TAKE 1 TABLET(75 MG) BY MOUTH DAILY Katrinka Garnette KIDD, MD  Active   Continuous Glucose Sensor (FREESTYLE LIBRE 3 PLUS SENSOR) [place] [id number]  1 each by Does not apply route every 14 (fourteen) days.  Patient not taking: Reported on 12/25/2023   Trixie File, MD  Active    Patient not taking:   Discontinued 10/22/20 1135 (Completed Course)    Patient not taking:   Discontinued 10/22/20 1135 (Completed Course)   escitalopram  (LEXAPRO ) 5 MG tablet [id number] Yes TAKE 1  TABLET(5 MG) BY MOUTH DAILY Katrinka Garnette KIDD, MD  Active   Evolocumab  (REPATHA  SURECLICK) 140 MG/ML EMMANUEL [id number] Yes Inject 140 mg into the skin every 14 (fourteen) days. Burnard Debby LABOR, MD  Active   ezetimibe  (ZETIA ) 10 MG tablet [id number] Yes TAKE 1 TABLET BY MOUTH EVERY DAY Katrinka Garnette KIDD, MD  Active   fluticasone  (FLONASE ) 50 MCG/ACT nasal spray [id number] Yes Place 2 sprays into both nostrils daily as needed for allergies or rhinitis. [provider]  Active Self  Fluticasone -Umeclidin-Vilant (TRELEGY ELLIPTA ) 100-62.5-25 MCG/ACT AEPB [id number] Yes INHALE 1 PUFF INTO THE LUNGS DAILY Dewald, Jonathan B, MD  Active   HUMALOG  KWIKPEN 200 UNIT/ML KwikPen [id number] Yes ADMINISTER 15 TO 25 UNITS UNDER THE SKIN THREE TIMES DAILY BEFORE MEALS Trixie File, MD  Active   hydrOXYzine (ATARAX) 25 MG tablet [id number] Yes Take 25 mg by mouth every 6 (six) hours as needed for anxiety. [provider]  Active Self  insulin  glargine (LANTUS  SOLOSTAR) 100 UNIT/ML Solostar Pen [id number] Yes Inject 40 Units into the skin daily. Trixie File, MD  Active   Insulin  Pen Needle 31G X 8 MM MISC [id number] Yes Use to inject insulin  4 times a day. Trixie File, MD  Active   isosorbide  mononitrate (IMDUR ) 30 MG 24 hr tablet [id number] Yes TAKE 1 TABLET BY MOUTH EVERY DAY Burnard Debby LABOR, MD  Active   losartan  (COZAAR ) 50 MG tablet [id number] Yes TAKE 1 TABLET BY MOUTH EVERY MORNING AND AT BEDTIME. Katrinka Garnette KIDD, MD  Active   metoprolol  succinate (TOPROL  XL) 25 MG 24 hr tablet [id number] Yes Take 0.5 tablets (12.5 mg total) by mouth  daily. Burnard Debby LABOR, MD  Active   montelukast  (SINGULAIR ) 10 MG tablet [id number] Yes Take 1 tablet (10 mg total) by mouth at bedtime. Kara Dorn NOVAK, MD  Active Self  nitroGLYCERIN  (NITROSTAT ) 0.4 MG SL tablet [id number] Yes Place 1 tablet (0.4 mg total) under the tongue every 5 (five) minutes as needed for chest pain (if pain does not resolve with 2nd  dose- seek care). Katrinka Garnette KIDD, MD  Active   ondansetron  (ZOFRAN -ODT) 4 MG disintegrating tablet [id number] Yes Take 1 tablet (4 mg total) by mouth every 8 (eight) hours as needed for up to 20 doses for nausea or vomiting. Katrinka Garnette KIDD, MD  Active   [facility] VERIO test strip [id number] Yes CHECK LEVELS 4 TIMES DAILY Kassie Mallick, MD  Active Self  Pitavastatin  Calcium  (LIVALO ) 1 MG TABS [id number] Yes Take 1 tablet (1 mg total) by mouth daily. Katrinka Garnette KIDD, MD  Active   torsemide  (DEMADEX ) 20 MG tablet [id number] Yes TAKE 1 TABLET BY MOUTH EVERY OTHER DAY FOR ANKLE SWELLING Burnard Debby LABOR, MD  Active   traZODone  (DESYREL ) 50 MG tablet [id number] Yes Take 0.5-1 tablets (25-50 mg total) by mouth at bedtime as needed for sleep. Katrinka Garnette KIDD, MD  Active             Home Care and Equipment/Supplies: Were Home Health Services Ordered?: NA Any new equipment or medical supplies ordered?: NA  Functional Questionnaire: Do you need assistance with bathing/showering or dressing?: No Do you need assistance with meal preparation?: No Do you need assistance with eating?: No Do you have difficulty maintaining continence: No Do you need assistance with getting out of bed/getting out of a chair/moving?: No Do you have difficulty managing or taking your medications?: No  Follow up appointments reviewed: PCP Follow-up appointment confirmed?: No (declined) MD Provider Line [phone] Given: No Specialist Hospital Follow-up appointment confirmed?: NA Do you need transportation to your follow-up appointment?: No Do you understand care options if your condition(s) worsen?: Yes-patient verbalized understanding    SIGNATURE Julian Lemmings, LPN [facility] Nurse Health Advisor Direct Dial [phone]

## 2023-12-25 NOTE — Telephone Encounter (Signed)
 Received fax from [facility] informing us  of patient's discharge from hospital on 12/23/23. Called to schedule pt for a HFU w/ PCP but patient was driving at that time. Informed patient can callback to r/s, just to ask for front desk directly.

## 2023-12-30 ENCOUNTER — Ambulatory Visit: Admitting: Internal Medicine

## 2023-12-30 NOTE — Progress Notes (Deleted)
 Patient ID: Martin LELON Huron Sr., male   DOB: [birth year], 78 y.o.   MRN: [id number]  HPI: Martin STUCK Sr. is a 78 y.o.-year-old male, returning for follow-up for DM2, dx in 2004, insulin -dependent since 2013, uncontrolled, with complications (CAD - s/p AMI, s/p CABG 2020, PAD - s/p angioplasty, A fib, CKD, DR, PN, ED). Pt. previously saw Dr. Kassie, but last visit with me 4 months ago.  Interim history: No increased urination, chest pain, nausea, blurry vision.    Reviewed HbA1c: Lab Results  Component Value Date   HGBA1C 8.4 (A) 08/26/2023   HGBA1C 8.9 (H) 04/15/2023   HGBA1C 7.9 (A) 07/22/2022   HGBA1C 8.9 (A) 03/18/2022   HGBA1C 8.3 (A) 08/20/2021   HGBA1C 8.1 (A) 06/04/2021   HGBA1C 9.1 (A) 12/18/2020   HGBA1C 8.1 (H) 08/16/2020   HGBA1C 8.6 (H) 02/09/2020   HGBA1C 7.6 (H) 07/08/2019   Previously on: - Novolog  30 units 3x a day with meals, including in the middle of the night He was on Lantus  before, sugars were better controlled then.  Currently on: - Lantus  20 >> 25 >> 30 >>  30-35 >> 40 units at bedtime - Novolog  20-25 >> 15-20 >> 24 >> 20-28 >> 24-28 units 15 min before the 3 meals - Humalog ; also, 4-5 units before coffee He was previously on Jardiance  but developed abdominal pain/nausea and vomiting-had to go to the hospital by ambulance and mL of the night in 03/2022.  He would not want to restart this.  Pt checks his sugars >4x a day and they are:  Previously:  Prev.:    Lowest sugar was 100 >> 50s (compression low?); he has hypoglycemia awareness at 100.  Highest sugar was upper 200s >> 300  Glucometer: One Touch Verio  - + CKD, last BUN/creatinine:  Lab Results  Component Value Date   BUN 22 10/14/2023   BUN 23 04/15/2023   CREATININE 1.23 10/14/2023   CREATININE 1.44 04/15/2023   Lab Results  Component Value Date   MICRALBCREAT 334 (H) 08/26/2023   MICRALBCREAT 2.0 10/04/2009   MICRALBCREAT 9.5 04/03/2009  On Cozaar  50 mg daily.  - + HL; last  set of lipids: Lab Results  Component Value Date   CHOL 162 10/14/2023   HDL 50 10/14/2023   LDLCALC 90 10/14/2023   LDLDIRECT 173.0 07/01/2018   TRIG 123 10/14/2023   CHOLHDL 3.2 10/14/2023  On Livalo  1 mg daily, Zetia  10 mg daily.  Also now on Repatha .  - last eye exam was on 11/27/2022.  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  He previously had no DR. He has IO inj. Dr. Marea.  - + numbness and tingling in his feet.  Last foot exam was on 09/03/2023 (Dr. Scherrie). He has history of osteomyelitis of his left toe, but this improved after his angioplasty procedure.  His wife died Feb 06, 2022.  He was grieving and under a lot of stress.  Now feeling better on escitalopram .  ROS: + see HPI  Past Medical History:  Diagnosis Date   A-fib (HCC)    Allergy 01-29-1999   Cataract    CKD (chronic kidney disease) stage 3, GFR 30-59 ml/min (HCC) 08/29/2020   Diabetes mellitus    Heart attack (HCC) 10/01/2018   Pt had open heart surgery   Hyperkalemia 08/29/2020   Hypertension    Osteomyelitis of fifth toe of left foot (HCC) 05/08/2020   Simple chronic bronchitis (HCC) 05/08/2020   Tobacco abuse  Past Surgical History:  Procedure Laterality Date   CORONARY ARTERY BYPASS GRAFT N/A 10/01/2018   Procedure: CORONARY ARTERY BYPASS GRAFTING (CABG) TIMES  FOUR USING LEFT MAMMARY ARTERY AND RIGHT GREATER SAPHEANOUS VEIN HARVESTED ENDOSCOPICALLY;  Surgeon: Kerrin Elspeth BROCKS, MD;  Location: [facility] OR;  Service: Open Heart Surgery;  Laterality: N/A;   HERNIA REPAIR     >10 years ago   IR ANGIOGRAM EXTREMITY LEFT  06/16/2020   IR RADIOLOGIST EVAL & MGMT  12/24/2016   IR RADIOLOGIST EVAL & MGMT  05/30/2020   IR RADIOLOGIST EVAL & MGMT  07/13/2020   IR RADIOLOGIST EVAL & MGMT  09/06/2020   IR RADIOLOGIST EVAL & MGMT  12/26/2020   IR THORACENTESIS ASP PLEURAL SPACE W/IMG GUIDE  10/06/2018   IR US  GUIDE VASC ACCESS RIGHT  06/16/2020   LEFT HEART CATH AND CORONARY ANGIOGRAPHY N/A 09/29/2018   Procedure:  LEFT HEART CATH AND CORONARY ANGIOGRAPHY;  Surgeon: Burnard Debby LABOR, MD;  Location: [facility] IN[facility] LAB;  Service: Cardiovascular;  Laterality: N/A;   open heart surfery     right shoulder surgery     around 2014   TEE WITHOUT CARDIOVERSION N/A 10/01/2018   Procedure: TRANSESOPHAGEAL ECHOCARDIOGRAM (TEE);  Surgeon: Kerrin Elspeth BROCKS, MD;  Location: [facility] OR;  Service: Open Heart Surgery;  Laterality: N/A;   Social History   Socioeconomic History   Marital status: Married    Spouse name: Not on file   Number of children: Not on file   Years of education: Not on file   Highest education level: Not on file  Occupational History   Not on file  Tobacco Use   Smoking status: Former    Current packs/day: 0.00    Average packs/day: 0.8 packs/day for 2.0 years (1.5 ttl pk-yrs)    Types: Cigarettes    Start date: 2002    Quit date: 04/29/2002    Years since quitting: 21.6   Smokeless tobacco: Never   Tobacco comments:    smoked for 10 years on and off  Substance and Sexual Activity   Alcohol  use: Yes    Comment: occasionally   Drug use: No   Sexual activity: Yes  Other Topics Concern   Not on file  Social History Narrative   Married. 3 kids. 7 grandkids. No greatgrandkids.       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 : Never true  Transportation Needs: No Transportation Needs (12/22/2023)   Received from [facility]    In the past 12 months, has lack of reliable transportation kept you from medical appointments,  meetings, work or from getting things needed for daily living? : No  Physical Activity: Not on file  Stress: Not on file  Social Connections: Unknown (08/09/2022)   [facility]   Social Network    Social Network: Not on file  Intimate Partner Violence: Not At Risk (10/29/2023)   [facility]  HITS    Over the last 12 months how often did your partner physically hurt you?: Never    Over the last 12 months how often did your partner insult you or talk down to you?: Never    Over the last 12 months how often did your partner threaten you with physical harm?: Never    Over the last 12 months how often did your partner scream or curse at you?: Never   Current Outpatient Medications on File Prior to Visit  Medication Sig Dispense Refill   aspirin  81 MG EC tablet Take 1 tablet (81 mg total) by mouth daily.     azelastine  (ASTELIN ) 0.1 % nasal spray Place 1 spray into both nostrils 2 (two) times daily. 30 mL 12   clopidogrel  (PLAVIX ) 75 MG tablet TAKE 1 TABLET(75 MG) BY MOUTH DAILY 90 tablet 3   Continuous Glucose Sensor (FREESTYLE LIBRE 3 PLUS SENSOR) MISC 1 each by Does not apply route every 14 (fourteen) days. (Patient not taking: Reported on 12/25/2023) 6 each 3   escitalopram  (LEXAPRO ) 5 MG tablet TAKE 1 TABLET(5 MG) BY MOUTH DAILY 90 tablet 3   Evolocumab  (REPATHA  SURECLICK) 140 MG/ML SOAJ Inject 140 mg into the skin every 14 (fourteen) days. 6 mL 3   ezetimibe  (ZETIA ) 10 MG tablet TAKE 1 TABLET BY MOUTH EVERY DAY 90 tablet 3   fluticasone  (FLONASE ) 50 MCG/ACT nasal spray Place 2 sprays into both nostrils daily as needed for allergies or rhinitis.     Fluticasone -Umeclidin-Vilant (TRELEGY ELLIPTA ) 100-62.5-25 MCG/ACT AEPB INHALE 1 PUFF INTO THE LUNGS DAILY 60 each 1   HUMALOG  KWIKPEN 200 UNIT/ML KwikPen ADMINISTER 15 TO 25 UNITS UNDER THE SKIN THREE TIMES DAILY BEFORE MEALS 12 mL 3   hydrOXYzine (ATARAX) 25 MG tablet Take 25 mg by mouth every 6 (six) hours as  needed for anxiety.     insulin  glargine (LANTUS  SOLOSTAR) 100 UNIT/ML Solostar Pen Inject 40 Units into the skin daily. 45 mL 3   Insulin  Pen Needle 31G X 8 MM MISC Use to inject insulin  4 times a day. 400 each 5   isosorbide  mononitrate (IMDUR ) 30 MG 24 hr tablet TAKE 1 TABLET BY MOUTH EVERY DAY 90 tablet 3   losartan  (COZAAR ) 50 MG tablet TAKE 1 TABLET BY MOUTH EVERY MORNING AND AT BEDTIME. 180 tablet 3   metoprolol  succinate (TOPROL  XL) 25 MG 24 hr tablet Take 0.5 tablets (12.5 mg total) by mouth daily. 90 tablet 3   montelukast  (SINGULAIR ) 10 MG tablet Take 1 tablet (10 mg total) by mouth at bedtime. 30 tablet 11   nitroGLYCERIN  (NITROSTAT ) 0.4 MG SL tablet Place 1 tablet (0.4 mg total) under the tongue every 5 (five) minutes as needed for chest pain (if pain does not resolve with 2nd dose- seek care). 20 tablet 5   ondansetron  (ZOFRAN -ODT) 4 MG disintegrating tablet Take 1 tablet (4 mg total) by mouth every 8 (eight) hours as needed for up to 20 doses for nausea or vomiting. 20 tablet 0   ONETOUCH VERIO test strip CHECK LEVELS 4 TIMES DAILY 100 strip 2   Pitavastatin  Calcium  (LIVALO ) 1 MG TABS Take 1 tablet (1 mg total) by mouth daily. 90 tablet 3   torsemide  (DEMADEX ) 20 MG tablet TAKE 1 TABLET BY MOUTH EVERY OTHER DAY FOR ANKLE SWELLING 45 tablet 1   traZODone  (DESYREL ) 50 MG tablet Take 0.5-1 tablets (25-50 mg total) by mouth at bedtime as needed for sleep. 30 tablet 3   [DISCONTINUED]  DAPTOmycin  (CUBICIN ) 500 MG injection Inject into the vein. (Patient not taking: Reported on 10/22/2020)     [DISCONTINUED] DAPTOmycin  350 MG SOLR Inject into the vein. (Patient not taking: No sig reported)     No current facility-administered medications on file prior to visit.   Allergies  Allergen Reactions   Jardiance  [Empagliflozin ] Nausea And Vomiting   Simvastatin  Diarrhea   Statins Other (See Comments)    achey joints   Xanax  [Alprazolam ] Anxiety   Family History  Problem Relation Age of  Onset   Hypertension Mother    Heart disease Mother        CHF did not see doctor   Diabetes Maternal Grandmother    Diabetes Son    PE: There were no vitals taken for this visit. Wt Readings from Last 3 Encounters:  11/18/23 256 lb 6.4 oz (116.3 kg)  10/14/23 269 lb 6.4 oz (122.2 kg)  08/26/23 278 lb 6.4 oz (126.3 kg)   Constitutional: overweight, in NAD Eyes:  EOMI, no exophthalmos ENT: no neck masses, no cervical lymphadenopathy Cardiovascular: RRR, No MRG Respiratory: CTA B in upper lungs but B lower rhonchi Musculoskeletal: no deformities Skin:no rashes Neurological: no tremor with outstretched hands  ASSESSMENT: 1. DM2, insulin -dependent, uncontrolled, with complications - CAD - s/p AMI, s/p CABG - PAD - s/p angioplasty 02.2022 - A fib - CKD - DR - PN; h/o osteomyelitis L 5th toe - ED  2. HL  PLAN:  1. Patient with longstanding, uncontrolled, diabetes, on basal-bolus insulin  regimen, with still poor control.  At last visit, HbA1c was lower, but still above target, at 8.4% and sugars appears to be slightly improved.  They were improving overnight but then increasing after every meal, particularly after lunch.  He was using a lower dose of Humalog  and not taking this only 60 minutes before the meal.  We discussed about increasing the doses and try to take it between 15 and 30 minutes before each meal.  We also discussed about bolusing few units of insulin  before coffee as sugars were increasing after this.  We did not change the Lantus  dose. CGM interpretation: -At today's visit, we reviewed his CGM downloads: It appears that *** of values are in target range (goal >70%), while *** are higher than 180 (goal <25%), and *** are lower than 70 (goal <4%).  The calculated average blood sugar is ***.  The projected HbA1c for the next 3 months (GMI) is ***. -Reviewing the CGM trends, ***  - I suggested to:  Patient Instructions  Please continue: - Lantus  40 units daily -  Humalog  U200 24-28 units 15-30 min before the 3 meals You may need to bolus 4-5 units before coffee.  For neuropathy you can try: - alpha-lipoic acid 600 mg 2x a day  Please return in 3-4 months.   - we checked his HbA1c: 7%  - advised to check sugars at different times of the day - 4x a day, rotating check times - advised for yearly eye exams >> he is UTD - at last visit, his ACR was elevated, at 334.  I advised him to make sure he was taking Cozaar  every day.  He declined retrying an SGLT2 inhibitor after having had side effects on Jardiance .  Will repeat this today. - return to clinic in 3-4 months  2. HL -Latest lipid panel was reviewed from 09/2023: LDL above target of less than 55, but much improved from baseline: Lab Results  Component Value Date  CHOL 162 10/14/2023   HDL 50 10/14/2023   LDLCALC 90 10/14/2023   LDLDIRECT 173.0 07/01/2018   TRIG 123 10/14/2023   CHOLHDL 3.2 10/14/2023  - He continues on Livalo  1 mg daily, Zetia  10 mg daily, and Repatha .  He is followed by cardiology. - Of note, he has a very high LP(a) (01/17/2023): 138.4.    Lela Fendt, MD PhD [facility] Endocrinology

## 2024-01-14 ENCOUNTER — Telehealth: Payer: Self-pay

## 2024-01-14 ENCOUNTER — Other Ambulatory Visit: Payer: Self-pay | Admitting: Pulmonary Disease

## 2024-01-14 DIAGNOSIS — R053 Chronic cough: Secondary | ICD-10-CM

## 2024-01-14 NOTE — Telephone Encounter (Signed)
 VM/ LM schedule F/u refills

## 2024-01-20 DIAGNOSIS — H18523 Epithelial (juvenile) corneal dystrophy, bilateral: Secondary | ICD-10-CM | POA: Diagnosis not present

## 2024-01-20 DIAGNOSIS — H35013 Changes in retinal vascular appearance, bilateral: Secondary | ICD-10-CM | POA: Diagnosis not present

## 2024-01-20 DIAGNOSIS — H43813 Vitreous degeneration, bilateral: Secondary | ICD-10-CM | POA: Diagnosis not present

## 2024-01-20 DIAGNOSIS — E113213 Type 2 diabetes mellitus with mild nonproliferative diabetic retinopathy with macular edema, bilateral: Secondary | ICD-10-CM | POA: Diagnosis not present

## 2024-01-31 DIAGNOSIS — Z961 Presence of intraocular lens: Secondary | ICD-10-CM | POA: Diagnosis not present

## 2024-01-31 DIAGNOSIS — E113213 Type 2 diabetes mellitus with mild nonproliferative diabetic retinopathy with macular edema, bilateral: Secondary | ICD-10-CM | POA: Diagnosis not present

## 2024-01-31 DIAGNOSIS — H04123 Dry eye syndrome of bilateral lacrimal glands: Secondary | ICD-10-CM | POA: Diagnosis not present

## 2024-02-01 ENCOUNTER — Other Ambulatory Visit: Payer: Self-pay | Admitting: Pulmonary Disease

## 2024-02-09 ENCOUNTER — Institutional Professional Consult (permissible substitution) (HOSPITAL_BASED_OUTPATIENT_CLINIC_OR_DEPARTMENT_OTHER): Admitting: Internal Medicine

## 2024-02-18 ENCOUNTER — Encounter (HOSPITAL_BASED_OUTPATIENT_CLINIC_OR_DEPARTMENT_OTHER): Payer: Self-pay

## 2024-03-09 ENCOUNTER — Other Ambulatory Visit: Payer: Self-pay | Admitting: Pulmonary Disease

## 2024-03-24 ENCOUNTER — Ambulatory Visit: Admitting: Pulmonary Disease

## 2024-04-13 ENCOUNTER — Other Ambulatory Visit: Payer: Self-pay

## 2024-04-13 ENCOUNTER — Other Ambulatory Visit: Payer: Self-pay | Admitting: Family Medicine

## 2024-04-13 ENCOUNTER — Other Ambulatory Visit (HOSPITAL_COMMUNITY): Payer: Self-pay

## 2024-04-13 MED ORDER — HUMALOG KWIKPEN 200 UNIT/ML ~~LOC~~ SOPN: PEN_INJECTOR | SUBCUTANEOUS | 3 refills | Status: AC

## 2024-04-13 MED ORDER — LANTUS SOLOSTAR 100 UNIT/ML ~~LOC~~ SOPN: 40.0000 [IU] | PEN_INJECTOR | Freq: Every day | SUBCUTANEOUS | 3 refills | Status: AC

## 2024-04-14 ENCOUNTER — Other Ambulatory Visit (HOSPITAL_COMMUNITY): Payer: Self-pay

## 2024-04-14 ENCOUNTER — Ambulatory Visit: Admitting: Family Medicine

## 2024-04-14 MED ORDER — TORSEMIDE 20 MG PO TABS: ORAL_TABLET | ORAL | 0 refills | Status: AC

## 2024-04-19 ENCOUNTER — Other Ambulatory Visit

## 2024-04-19 ENCOUNTER — Ambulatory Visit: Admitting: Internal Medicine

## 2024-04-19 ENCOUNTER — Encounter: Payer: Self-pay | Admitting: Internal Medicine

## 2024-04-19 VITALS — BP 130/70 | HR 80 | Ht 72.0 in | Wt 273.4 lb

## 2024-04-19 DIAGNOSIS — E785 Hyperlipidemia, unspecified: Secondary | ICD-10-CM | POA: Diagnosis not present

## 2024-04-19 DIAGNOSIS — E1151 Type 2 diabetes mellitus with diabetic peripheral angiopathy without gangrene: Secondary | ICD-10-CM | POA: Diagnosis not present

## 2024-04-19 DIAGNOSIS — E1169 Type 2 diabetes mellitus with other specified complication: Secondary | ICD-10-CM

## 2024-04-19 DIAGNOSIS — Z794 Long term (current) use of insulin: Secondary | ICD-10-CM

## 2024-04-19 LAB — POCT GLYCOSYLATED HEMOGLOBIN (HGB A1C): Hemoglobin A1C: 8.5 % — AB (ref 4.0–5.6)

## 2024-04-19 MED ORDER — CEQUR SIMPLICITY INSERTER MISC: 0 refills | Status: AC

## 2024-04-19 MED ORDER — CEQUR SIMPLICITY 2U DEVI: 1.0000 | 11 refills | Status: AC

## 2024-04-19 MED ORDER — INSULIN LISPRO 100 UNIT/ML IJ SOLN: 75.0000 [IU] | Freq: Three times a day (TID) | INTRAMUSCULAR | 3 refills | Status: AC

## 2024-04-19 NOTE — Addendum Note (Signed)
 Addended by: CLEOTILDE ROLIN RAMAN on: 04/19/2024 08:47 AM   Modules accepted: Orders

## 2024-04-19 NOTE — Progress Notes (Signed)
 Patient ID: Martin LELON Huron Sr., male   DOB: [birth year], 78 y.o.   MRN: [id number]  HPI: Martin BELLAMY Sr. is a 78 y.o.-year-old male, returning for follow-up for DM2, dx in 2004, insulin -dependent since 2013, uncontrolled, with complications (CAD - s/p AMI, s/p CABG 2020, PAD - s/p angioplasty, A fib, CKD, DR, PN). Pt. previously saw Dr. Kassie, but last visit with me 4 months ago.  Interim history: No increased urination, chest pain, nausea, blurry vision.  He has swelling in L leg - chronic. He lost 5 pounds since last visit. He mentions that he is frequently forgetting his Humalog  pen at home and not taking it before eating out.  He is eating out most days with his fiance.  When he is taking the Humalog  he is taking it after the meals.  Reviewed HbA1c: 12/22/2023: HbA1c 7.6% Lab Results  Component Value Date   HGBA1C 8.4 (A) 08/26/2023   HGBA1C 8.9 (H) 04/15/2023   HGBA1C 7.9 (A) 07/22/2022   HGBA1C 8.9 (A) 03/18/2022   HGBA1C 8.3 (A) 08/20/2021   HGBA1C 8.1 (A) 06/04/2021   HGBA1C 9.1 (A) 12/18/2020   HGBA1C 8.1 (H) 08/16/2020   HGBA1C 8.6 (H) 02/09/2020   HGBA1C 7.6 (H) 07/08/2019   Previously on: - Novolog  30 units 3x a day with meals, including in the middle of the night He was on Lantus  before, sugars were better controlled then.  Currently on: - Lantus  20 >> 25 >> 30 >>  30-35 >> 40 units at bedtime - Novolog  20-25 >> ... 24-28 units 15 min before the 3 meals - Humalog , also, 4 to 5 units before coffee He was previously on Jardiance  but developed abdominal pain/nausea and vomiting-had to go to the hospital by ambulance and mL of the night in 03/2022.  He would not want to restart this.  Pt checks his sugars >4x a day and they are:  Previously:  Prev.:   Lowest sugar was  70 >> 100 >> 50s (compression low?) >> 70; he has hypoglycemia awareness at 100.  Highest sugar was  200s >> 300  Glucometer: One Touch Verio  Meals: - L: PB + crackers - D: eats out  - +  CKD, last BUN/creatinine:  12/23/2023: 24/1.49, GFR 47, glucose 238 Lab Results  Component Value Date   BUN 22 10/14/2023   BUN 23 04/15/2023   CREATININE 1.23 10/14/2023   CREATININE 1.44 04/15/2023   Lab Results  Component Value Date   MICRALBCREAT 334 (H) 08/26/2023   MICRALBCREAT 2.0 10/04/2009   MICRALBCREAT 9.5 04/03/2009  On Cozaar  50 mg daily.  - + HL; last set of lipids: Lab Results  Component Value Date   CHOL 162 10/14/2023   HDL 50 10/14/2023   LDLCALC 90 10/14/2023   LDLDIRECT 173.0 07/01/2018   TRIG 123 10/14/2023   CHOLHDL 3.2 10/14/2023  On Livalo  1 mg daily, Zetia  10 mg daily, Repatha .  - last eye exam was on 09/16/2023.  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  He previously had no DR. He has IO inj. Dr. Marea.  - + numbness and tingling in his feet.  Last foot exam was on 09/03/2023.  He has history of osteomyelitis of his left toe, but this improved after his angioplasty procedure.  His wife died 03/15/22.  He was grieving and under a lot of stress.  Now feeling better on escitalopram .  ROS: + see HPI  Past Medical History:  Diagnosis Date   A-fib (HCC)  Allergy 01-29-1999   Cataract    CKD (chronic kidney disease) stage 3, GFR 30-59 ml/min (HCC) 08/29/2020   Diabetes mellitus    Heart attack (HCC) 10/01/2018   Pt had open heart surgery   Hyperkalemia 08/29/2020   Hypertension    Osteomyelitis of fifth toe of left foot (HCC) 05/08/2020   Simple chronic bronchitis (HCC) 05/08/2020   Tobacco abuse    Past Surgical History:  Procedure Laterality Date   CORONARY ARTERY BYPASS GRAFT N/A 10/01/2018   Procedure: CORONARY ARTERY BYPASS GRAFTING (CABG) TIMES  FOUR USING LEFT MAMMARY ARTERY AND RIGHT GREATER SAPHEANOUS VEIN HARVESTED ENDOSCOPICALLY;  Surgeon: Kerrin Elspeth BROCKS, MD;  Location: [facility] OR;  Service: Open Heart Surgery;  Laterality: N/A;   HERNIA REPAIR     >10 years ago   IR ANGIOGRAM EXTREMITY LEFT  06/16/2020   IR RADIOLOGIST EVAL &  MGMT  12/24/2016   IR RADIOLOGIST EVAL & MGMT  05/30/2020   IR RADIOLOGIST EVAL & MGMT  07/13/2020   IR RADIOLOGIST EVAL & MGMT  09/06/2020   IR RADIOLOGIST EVAL & MGMT  12/26/2020   IR THORACENTESIS ASP PLEURAL SPACE W/IMG GUIDE  10/06/2018   IR US  GUIDE VASC ACCESS RIGHT  06/16/2020   LEFT HEART CATH AND CORONARY ANGIOGRAPHY N/A 09/29/2018   Procedure: LEFT HEART CATH AND CORONARY ANGIOGRAPHY;  Surgeon: Burnard Debby LABOR, MD;  Location: [facility] INVASIVE [facility] LAB;  Service: Cardiovascular;  Laterality: N/A;   open heart surfery     right shoulder surgery     around 2014   TEE WITHOUT CARDIOVERSION N/A 10/01/2018   Procedure: TRANSESOPHAGEAL ECHOCARDIOGRAM (TEE);  Surgeon: Kerrin Elspeth BROCKS, MD;  Location: [facility] OR;  Service: Open Heart Surgery;  Laterality: N/A;   Social History   Socioeconomic History   Marital status: Married    Spouse name: Not on file   Number of children: Not on file   Years of education: Not on file   Highest education level: Not on file  Occupational History   Not on file  Tobacco Use   Smoking status: Former    Current packs/day: 0.00    Average packs/day: 0.8 packs/day for 2.0 years (1.5 ttl pk-yrs)    Types: Cigarettes    Start date: 2002    Quit date: 04/29/2002    Years since quitting: 21.9   Smokeless tobacco: Never   Tobacco comments:    smoked for 10 years on and off  Substance and Sexual Activity   Alcohol  use: Yes    Comment: occasionally   Drug use: No   Sexual activity: Yes  Other Topics Concern   Not on file  Social History Narrative   Married. 3 kids. 7 grandkids. No greatgrandkids.       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      Hobbies: golf previously, yardwork   Social Drivers of Health   Tobacco Use: Low Risk (12/22/2023)   Received from [facility]   Patient History    Smoking Tobacco Use: Never    Smokeless Tobacco Use: Never    Passive Exposure: Not on file  Recent Concern: Tobacco Use - Medium Risk  (11/18/2023)   Patient History    Smoking Tobacco Use: Former    Smokeless Tobacco Use: Never    Passive Exposure: Not on file  Financial Resource Strain: Not on file  Food Insecurity: Low Risk (12/22/2023)   Received from [facility]   Epic    Within the  past 12 months, you worried that your food would run out before you got money to buy more: Never true    Within the past 12 months, the food you bought just didn't last and you didn't have money to get more. : Never true  Transportation Needs: No Transportation Needs (12/22/2023)   Received from [facility]    In the past 12 months, has lack of reliable transportation kept you from medical appointments, meetings, work or from getting things needed for daily living? : No  Physical Activity: Not on file  Stress: Not on file  Social Connections: Not on file  Intimate Partner Violence: Not At Risk (10/29/2023)   Received from [facility]   HITS    Over the last 12 months how often did your partner physically hurt you?: Never    Over the last 12 months how often did your partner insult you or talk down to you?: Never    Over the last 12 months how often did your partner threaten you with physical harm?: Never    Over the last 12 months how often did your partner scream or curse at you?: Never  Depression (PHQ2-9): Low Risk (11/18/2023)   Depression (PHQ2-9)    PHQ-2 Score: 1  Alcohol  Screen: Not on file  Housing: Low Risk (12/22/2023)   Received from [facility]   Epic    What is your living situation today?: I have a steady place to live    Think about the place you live. Do you have problems with any of the following? Choose all that apply:: None/None on this list  Utilities: Low Risk (12/22/2023)   Received from [facility]   Utilities    In the past 12 months has the electric, gas, oil, or water company threatened to shut off services in your home? : No  Health Literacy: Not on file   Current Outpatient  Medications on File Prior to Visit  Medication Sig Dispense Refill   aspirin  81 MG EC tablet Take 1 tablet (81 mg total) by mouth daily.     azelastine  (ASTELIN ) 0.1 % nasal spray Place 1 spray into both nostrils 2 (two) times daily. 30 mL 12   clopidogrel  (PLAVIX ) 75 MG tablet TAKE 1 TABLET(75 MG) BY MOUTH DAILY 90 tablet 3   Continuous Glucose Sensor (FREESTYLE LIBRE 3 PLUS SENSOR) MISC 1 each by Does not apply route every 14 (fourteen) days. (Patient not taking: Reported on 12/25/2023) 6 each 3   escitalopram  (LEXAPRO ) 5 MG tablet TAKE 1 TABLET(5 MG) BY MOUTH DAILY 90 tablet 3   Evolocumab  (REPATHA  SURECLICK) 140 MG/ML SOAJ Inject 140 mg into the skin every 14 (fourteen) days. 6 mL 3   ezetimibe  (ZETIA ) 10 MG tablet TAKE 1 TABLET BY MOUTH EVERY DAY 90 tablet 3   fluticasone  (FLONASE ) 50 MCG/ACT nasal spray Place 2 sprays into both nostrils daily as needed for allergies or rhinitis.     Fluticasone -Umeclidin-Vilant (TRELEGY ELLIPTA ) 100-62.5-25 MCG/ACT AEPB INHALE 1 PUFF INTO THE LUNGS DAILY 60 each 0   hydrOXYzine (ATARAX) 25 MG tablet Take 25 mg by mouth every 6 (six) hours as needed for anxiety.     insulin  glargine (LANTUS  SOLOSTAR) 100 UNIT/ML Solostar Pen Inject 40 Units into the skin daily. 45 mL 3   insulin  lispro (HUMALOG  KWIKPEN) 200 UNIT/ML KwikPen ADMINISTER 15 TO 25 UNITS UNDER THE SKIN THREE TIMES DAILY BEFORE MEALS 12 mL 3   Insulin  Pen Needle 31G X 8  MM MISC Use to inject insulin  4 times a day. 400 each 5   isosorbide  mononitrate (IMDUR ) 30 MG 24 hr tablet TAKE 1 TABLET BY MOUTH EVERY DAY 90 tablet 3   losartan  (COZAAR ) 50 MG tablet TAKE 1 TABLET BY MOUTH EVERY MORNING AND AT BEDTIME. 180 tablet 3   metoprolol  succinate (TOPROL  XL) 25 MG 24 hr tablet Take 0.5 tablets (12.5 mg total) by mouth daily. 90 tablet 3   montelukast  (SINGULAIR ) 10 MG tablet Take 1 tablet (10 mg total) by mouth at bedtime. 30 tablet 11   nitroGLYCERIN  (NITROSTAT ) 0.4 MG SL tablet Place 1 tablet (0.4 mg  total) under the tongue every 5 (five) minutes as needed for chest pain (if pain does not resolve with 2nd dose- seek care). 20 tablet 5   ondansetron  (ZOFRAN -ODT) 4 MG disintegrating tablet Take 1 tablet (4 mg total) by mouth every 8 (eight) hours as needed for up to 20 doses for nausea or vomiting. 20 tablet 0   ONETOUCH VERIO test strip CHECK LEVELS 4 TIMES DAILY 100 strip 2   Pitavastatin  Calcium  (LIVALO ) 1 MG TABS Take 1 tablet (1 mg total) by mouth daily. 90 tablet 3   torsemide  (DEMADEX ) 20 MG tablet TAKE 1 TABLET BY MOUTH EVERY OTHER DAY FOR ANKLE SWELLING 7 tablet 0   traZODone  (DESYREL ) 50 MG tablet Take 0.5-1 tablets (25-50 mg total) by mouth at bedtime as needed for sleep. 30 tablet 3   [DISCONTINUED] DAPTOmycin  (CUBICIN ) 500 MG injection Inject into the vein. (Patient not taking: Reported on 10/22/2020)     [DISCONTINUED] DAPTOmycin  350 MG SOLR Inject into the vein. (Patient not taking: No sig reported)     No current facility-administered medications on file prior to visit.   Allergies  Allergen Reactions   Jardiance  [Empagliflozin ] Nausea And Vomiting   Simvastatin  Diarrhea   Statins Other (See Comments)    achey joints   Xanax  [Alprazolam ] Anxiety   Family History  Problem Relation Age of Onset   Hypertension Mother    Heart disease Mother        CHF did not see doctor   Diabetes Maternal Grandmother    Diabetes Son    PE: BP 130/70   Pulse 80   Ht 6' (1.829 m)   Wt 273 lb 6.4 oz (124 kg)   SpO2 97%   BMI 37.08 kg/m  Wt Readings from Last 10 Encounters:  04/19/24 273 lb 6.4 oz (124 kg)  11/18/23 256 lb 6.4 oz (116.3 kg)  10/14/23 269 lb 6.4 oz (122.2 kg)  08/26/23 278 lb 6.4 oz (126.3 kg)  04/21/23 281 lb 6.4 oz (127.6 kg)  04/15/23 279 lb 9.6 oz (126.8 kg)  03/05/23 279 lb (126.6 kg)  01/17/23 271 lb (122.9 kg)  12/16/22 271 lb (122.9 kg)  10/01/22 267 lb 12.8 oz (121.5 kg)   Constitutional: overweight, in NAD Eyes:  EOMI, no exophthalmos ENT: no neck  masses, no cervical lymphadenopathy Cardiovascular: RRR, No MRG Respiratory: CTA B in upper lungs but B lower rhonchi Musculoskeletal: no deformities Skin:no rashes Neurological: no tremor with outstretched hands  ASSESSMENT: 1. DM2, insulin -dependent, uncontrolled, with complications - CAD - s/p AMI, s/p CABG - PAD - s/p angioplasty 02.2022 - A fib - CKD - DR - PN; h/o osteomyelitis L 5th toe  2. HL  PLAN:  1. Patient with longstanding, uncontrolled, type 2 diabetes, basal-bolus insulin  regimen, with still poor control.  At last visit, HbA1c was lower, at 8.4% but since  then he had another HbA1c which was lower, at 7.6% on 12/22/2023. - He could not tolerate SGLT2 inhibitors in the past (Jardiance ) due to nausea/vomiting/abdominal pain - At last visit, sugars were slightly better than before, improving overnight but increasing after every meal, particularly after lunch.  He was using a lower dose of Humalog  and recommended and was not taking this always 15 minutes before the meal.  We discussed about trying to do so if and also to avoid injecting in the thighs to avoid scar tissue.  I advised him to also bolus a lower amount of Humalog  before coffee as sugars were higher after this. - At last visit, he described symptoms of ED.  We discussed about seeing urology. - He also described symptoms of neuropathy and I recommended alpha lipoic acid. CGM interpretation: -At today's visit, we reviewed his CGM downloads: It appears that 28% of values are in target range (goal >70%), while 72% are higher than 180 (goal <25%), and 0% are lower than 70 (goal <4%).  The calculated average blood sugar is 221.  The projected HbA1c for the next 3 months (GMI) is 8.6%. -Reviewing the CGM trends, sugars appear to be higher than before, mostly fluctuating above the upper limit of the target range and increasing as the day goes by, with the largest increase in blood sugars after lunch.  Upon questioning, not  only that he forgets many insulin  doses but he takes it after the meal whenever he remembers it.  We discussed about the possibility of using the CeQur simplicity insulin  pump.  He would be very interested in this.  He feels that this he is compliance with insulin  since he will always have insulin  with him.  I sent a prescription for the device to his pharmacy and demonstrated how to use it.  He will try to obtain it from the pharmacy and see if he can get training for it locally.  If not, I advised him to let me know so I can refer him to diabetes education here in my office.  We did discuss that even after he starts the pump, unless he boluses 15 minutes before meals, we will not get his diabetes controlled.  He mentions that he had a severe hypoglycemia episode at 1 point when he had significant he was in the hospital so he is afraid to bolus before the meals.  I advised him to start with a lower dose and then increase the dose as needed.  For now, we will continue the current dose of Lantus . - I suggested to:  Patient Instructions  Please continue: - Lantus  40 units daily  Change: - Humalog  U200 20-25 units 15 min before the 3 meals You may need to bolus 4-5 units Humalog  before coffee.  Let's try to start the CeQur Simplicity pump.  Please return in 3-4 months.   - we checked his HbA1c: 8.5% (higher) - advised to check sugars at different times of the day - 4x a day, rotating check times - advised for yearly eye exams >> he is UTD - At last visit, ACR was higher than before, at 334.  He was already on Cozaar  50 mg daily and declined to start SGLT2 inhibitors after having had side effects from Jardiance .  Will repeat the level today. - return to clinic in 3-4 months  2. HL Latest lipid panel reviewed from 12/2022: LDL above target but much improved in the last 5 years: Lab Results  Component Value Date  CHOL 162 10/14/2023   HDL 50 10/14/2023   LDLCALC 90 10/14/2023   LDLDIRECT 173.0  07/01/2018   TRIG 123 10/14/2023   CHOLHDL 3.2 10/14/2023  - He continues on Livalo  1 mg daily, Zetia  10 mg daily and Repatha .  He is followed by cardiology. - Of note, he has a very high LP(a) (01/17/2023): 138.4.    Lela Fendt, MD PhD [facility] Endocr[facility]

## 2024-04-19 NOTE — Patient Instructions (Addendum)
 Please continue: - Lantus  40 units daily  Change: - Humalog  U200 20-25 units 15 min before the 3 meals You may need to bolus 4-5 units Humalog  before coffee.  Let's try to start the CeQur Simplicity pump.  Please return in 3-4 months.

## 2024-04-20 ENCOUNTER — Ambulatory Visit: Payer: Self-pay | Admitting: Internal Medicine

## 2024-04-20 LAB — MICROALBUMIN / CREATININE URINE RATIO
Creatinine, Urine: 65 mg/dL (ref 20–320)
Microalb Creat Ratio: 322 mg/g{creat} — ABNORMAL HIGH
Microalb, Ur: 20.9 mg/dL

## 2024-04-27 ENCOUNTER — Other Ambulatory Visit (HOSPITAL_COMMUNITY): Payer: Self-pay

## 2024-04-27 ENCOUNTER — Telehealth: Payer: Self-pay | Admitting: Pharmacy Technician

## 2024-04-27 NOTE — Telephone Encounter (Signed)
 Pharmacy Patient Advocate Encounter   Received notification from Onbase that prior authorization for insulin  lispro (HUMALOG ) 100 UNIT/ML injection  is required/requested.   Insurance verification completed.   The patient is insured through [facility]. Key: [id number]   Prior Authorization form/request asks a question that requires your assistance. Please see the question below and advise accordingly. The PA will not be submitted until the necessary information is received.   **Is this patient taking Humalog  100mg  or 200mg . Or is he taking both? They are both on his chart. The most recent one called in was the 100mg , but reading the chart notes it looks like he's suppose to just be on the 200mg . Please advise if I need to send a PA for the 100mg  Humalog .**

## 2024-04-28 ENCOUNTER — Other Ambulatory Visit (HOSPITAL_COMMUNITY): Payer: Self-pay

## 2024-04-28 NOTE — Telephone Encounter (Signed)
 Pharmacy Patient Advocate Encounter   Received notification from Pt Calls Messages that prior authorization for Humalog  200u is required/requested.   Insurance verification completed.   The patient is insured through [employer].   Per test claim: The current 30 day co-pay is, $35.  No PA needed at this time. This test claim was processed through [facility]- copay amounts may vary at other pharmacies due to pharmacy/plan contracts, or as the patient moves through the different stages of their insurance plan.

## 2024-04-28 NOTE — Telephone Encounter (Signed)
 LMTRC  J.Shonna Deiter,RMA

## 2024-04-28 NOTE — Telephone Encounter (Signed)
 I called and spoke with the patient and he states that he is using U200

## 2024-05-08 ENCOUNTER — Other Ambulatory Visit: Payer: Self-pay | Admitting: Internal Medicine

## 2024-05-19 ENCOUNTER — Encounter: Payer: Self-pay | Admitting: Family Medicine

## 2024-05-19 ENCOUNTER — Ambulatory Visit: Admitting: Family Medicine

## 2024-05-19 VITALS — BP 128/86 | HR 82 | Temp 97.7°F | Ht 72.0 in | Wt 283.6 lb

## 2024-05-19 DIAGNOSIS — E1169 Type 2 diabetes mellitus with other specified complication: Secondary | ICD-10-CM

## 2024-05-19 DIAGNOSIS — I10 Essential (primary) hypertension: Secondary | ICD-10-CM

## 2024-05-19 DIAGNOSIS — E785 Hyperlipidemia, unspecified: Secondary | ICD-10-CM

## 2024-05-19 DIAGNOSIS — Z794 Long term (current) use of insulin: Secondary | ICD-10-CM

## 2024-05-19 DIAGNOSIS — Z6838 Body mass index (BMI) 38.0-38.9, adult: Secondary | ICD-10-CM | POA: Diagnosis not present

## 2024-05-19 DIAGNOSIS — Z789 Other specified health status: Secondary | ICD-10-CM | POA: Diagnosis not present

## 2024-05-19 DIAGNOSIS — F411 Generalized anxiety disorder: Secondary | ICD-10-CM

## 2024-05-19 LAB — COMPREHENSIVE METABOLIC PANEL WITH GFR
ALT: 20 U/L (ref 3–53)
AST: 25 U/L (ref 5–37)
Albumin: 4.6 g/dL (ref 3.5–5.2)
Alkaline Phosphatase: 41 U/L (ref 39–117)
BUN: 20 mg/dL (ref 6–23)
CO2: 30 meq/L (ref 19–32)
Calcium: 9.4 mg/dL (ref 8.4–10.5)
Chloride: 101 meq/L (ref 96–112)
Creatinine, Ser: 1.25 mg/dL (ref 0.40–1.50)
GFR: 54.66 mL/min — ABNORMAL LOW
Glucose, Bld: 151 mg/dL — ABNORMAL HIGH (ref 70–99)
Potassium: 5.4 meq/L — ABNORMAL HIGH (ref 3.5–5.1)
Sodium: 136 meq/L (ref 135–145)
Total Bilirubin: 1.2 mg/dL (ref 0.2–1.2)
Total Protein: 7.7 g/dL (ref 6.0–8.3)

## 2024-05-19 LAB — CBC WITH DIFFERENTIAL/PLATELET
Basophils Absolute: 0 K/uL (ref 0.0–0.1)
Basophils Relative: 0.3 % (ref 0.0–3.0)
Eosinophils Absolute: 0.1 K/uL (ref 0.0–0.7)
Eosinophils Relative: 1.8 % (ref 0.0–5.0)
HCT: 37.5 % — ABNORMAL LOW (ref 39.0–52.0)
Hemoglobin: 12.6 g/dL — ABNORMAL LOW (ref 13.0–17.0)
Lymphocytes Relative: 22.5 % (ref 12.0–46.0)
Lymphs Abs: 1.3 K/uL (ref 0.7–4.0)
MCHC: 33.5 g/dL (ref 30.0–36.0)
MCV: 91.2 fl (ref 78.0–100.0)
Monocytes Absolute: 0.4 K/uL (ref 0.1–1.0)
Monocytes Relative: 6.9 % (ref 3.0–12.0)
Neutro Abs: 4 K/uL (ref 1.4–7.7)
Neutrophils Relative %: 68.5 % (ref 43.0–77.0)
Platelets: 254 K/uL (ref 150.0–400.0)
RBC: 4.11 Mil/uL — ABNORMAL LOW (ref 4.22–5.81)
RDW: 14.6 % (ref 11.5–15.5)
WBC: 5.9 K/uL (ref 4.0–10.5)

## 2024-05-19 LAB — LIPID PANEL
Cholesterol: 196 mg/dL (ref 28–200)
HDL: 50.4 mg/dL
LDL Cholesterol: 132 mg/dL — ABNORMAL HIGH (ref 10–99)
NonHDL: 145.49
Total CHOL/HDL Ratio: 4
Triglycerides: 68 mg/dL (ref 10.0–149.0)
VLDL: 13.6 mg/dL (ref 0.0–40.0)

## 2024-05-19 MED ORDER — ZYPITAMAG 2 MG PO TABS: 2.0000 mg | ORAL_TABLET | Freq: Every day | ORAL | 3 refills | Status: AC

## 2024-05-19 NOTE — Patient Instructions (Addendum)
 Please stop by lab before you go If you have mychart- we will send your results within 3 business days of us  receiving them.  If you do not have mychart- we will call you about results within 5 business days of us  receiving them.  *please also note that you will see labs on mychart as soon as they post. I will later go in and write notes on them- will say notes from Dr. Katrinka   Cut the liquid IV  - if anything if you feel starting to get dehydrated take an extra day off of torsemide  but if you note more swelling with this or weight gain let us  know  Recommended follow up: Return in about 4 months (around 09/16/2024) for physical or sooner if needed.Schedule b4 you leave.

## 2024-05-19 NOTE — Progress Notes (Signed)
 " Phone [phone] In person visit   Subjective:   Martin LEIFHEIT Sr. is a 79 y.o. year old very pleasant male patient who presents for/with See problem oriented charting Chief Complaint  Patient presents with   Medical Management of Chronic Issues    6 month follow up; would like to know if he is immune to measles;    Diabetes    Past Medical History-  Patient Active Problem List   Diagnosis Date Noted   Osteomyelitis of fifth toe of left foot (HCC) 05/08/2020    Priority: High   History of atrial fibrillation- post op 10/24/2018    Priority: High   Coronary artery disease s/p CABG 10/01/2018 after STEMI     Priority: High   Undiagnosed cardiac murmurs 08/15/2015    Priority: High   Diabetes (HCC) 12/14/2009    Priority: High   GAD (generalized anxiety disorder) 08/09/2022    Priority: Medium    Drug-induced myopathy 06/18/2022    Priority: Medium    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    Priority: Medium    PAD (peripheral artery disease) 08/20/2017    Priority: Medium    Partial tear of right Achilles tendon 02/12/2017    Priority: Medium    Aortic atherosclerosis 10/07/2016    Priority: Medium    History of skin cancer 08/15/2015    Priority: Medium    Diabetic retinopathy (HCC) 02/21/2015    Priority: Medium    Hyperlipidemia associated with type 2 diabetes mellitus (HCC) 08/24/2014    Priority: Medium    Diabetic polyneuropathy (HCC) 04/11/2009    Priority: Medium    Essential hypertension 02/03/2007    Priority: Medium    Acute blood loss as cause of postoperative anemia 10/24/2018    Priority: Low   BPH (benign prostatic hyperplasia) 10/24/2018    Priority: Low   History of depression 10/24/2018    Priority: Low   S/P CABG x 4 10/01/2018    Priority: Low   Onychomycosis of toenail 02/17/2018    Priority: Low   Allergic rhinitis 08/20/2017    Priority: Low   C. difficile colitis 09/25/2016    Priority: Low   History of aspiration pneumonia 09/25/2016     Priority: Low   Former smoker 08/24/2014    Priority: Low   Ingrowing toenail 07/19/2014    Priority: Low   BACK PAIN, LUMBAR, WITH RADICULOPATHY 08/30/2008    Priority: Low   Obesity, morbid (HCC) 05/19/2024   Simple chronic bronchitis (HCC) 05/08/2020    Medications- reviewed and updated Current Outpatient Medications  Medication Sig Dispense Refill   aspirin  81 MG EC tablet Take 1 tablet (81 mg total) by mouth daily.     azelastine  (ASTELIN ) 0.1 % nasal spray Place 1 spray into both nostrils 2 (two) times daily. 30 mL 12   clopidogrel  (PLAVIX ) 75 MG tablet TAKE 1 TABLET(75 MG) BY MOUTH DAILY 90 tablet 3   Continuous Glucose Sensor (FREESTYLE LIBRE 3 PLUS SENSOR) MISC USE AS DIRECTED EVERY 15 DAYS 6 each 3   escitalopram  (LEXAPRO ) 5 MG tablet TAKE 1 TABLET(5 MG) BY MOUTH DAILY 90 tablet 3   ezetimibe  (ZETIA ) 10 MG tablet TAKE 1 TABLET BY MOUTH EVERY DAY 90 tablet 3   fluticasone  (FLONASE ) 50 MCG/ACT nasal spray Place 2 sprays into both nostrils daily as needed for allergies or rhinitis.     Fluticasone -Umeclidin-Vilant (TRELEGY ELLIPTA ) 100-62.5-25 MCG/ACT AEPB INHALE 1 PUFF INTO THE LUNGS DAILY 60 each 0   injection  device for insulin  (CEQUR SIMPLICITY 2U) DEVI 1 each by Other route every 3 (three) days. 10 each 11   Injection Device for Insulin  (CEQUR SIMPLICITY INSERTER) MISC Use as advised 1 each 0   insulin  glargine (LANTUS  SOLOSTAR) 100 UNIT/ML Solostar Pen Inject 40 Units into the skin daily. 45 mL 3   insulin  lispro (HUMALOG  KWIKPEN) 200 UNIT/ML KwikPen ADMINISTER 15 TO 25 UNITS UNDER THE SKIN THREE TIMES DAILY BEFORE MEALS (Patient taking differently: ADMINISTER 20 TO 25 UNITS UNDER THE SKIN THREE TIMES DAILY BEFORE MEALS) 12 mL 3   insulin  lispro (HUMALOG ) 100 UNIT/ML injection Inject 0.75 mLs (75 Units total) into the skin 3 (three) times daily before meals. 30 mL 3   Insulin  Pen Needle 31G X 8 MM MISC Use to inject insulin  4 times a day. 400 each 5   isosorbide   mononitrate (IMDUR ) 30 MG 24 hr tablet TAKE 1 TABLET BY MOUTH EVERY DAY 90 tablet 3   losartan  (COZAAR ) 50 MG tablet TAKE 1 TABLET BY MOUTH EVERY MORNING AND AT BEDTIME. 180 tablet 3   montelukast  (SINGULAIR ) 10 MG tablet Take 1 tablet (10 mg total) by mouth at bedtime. 30 tablet 11   nitroGLYCERIN  (NITROSTAT ) 0.4 MG SL tablet Place 1 tablet (0.4 mg total) under the tongue every 5 (five) minutes as needed for chest pain (if pain does not resolve with 2nd dose- seek care). 20 tablet 5   ondansetron  (ZOFRAN -ODT) 4 MG disintegrating tablet Take 1 tablet (4 mg total) by mouth every 8 (eight) hours as needed for up to 20 doses for nausea or vomiting. 20 tablet 0   ONETOUCH VERIO test strip CHECK LEVELS 4 TIMES DAILY 100 strip 2   Pitavastatin  Magnesium  (ZYPITAMAG ) 2 MG TABS Take 2 mg by mouth daily. 90 tablet 3   torsemide  (DEMADEX ) 20 MG tablet TAKE 1 TABLET BY MOUTH EVERY OTHER DAY FOR ANKLE SWELLING 7 tablet 0   hydrOXYzine (ATARAX) 25 MG tablet Take 25 mg by mouth every 6 (six) hours as needed for anxiety. (Patient not taking: Reported on 05/19/2024)     traZODone  (DESYREL ) 50 MG tablet Take 0.5-1 tablets (25-50 mg total) by mouth at bedtime as needed for sleep. (Patient not taking: Reported on 05/19/2024) 30 tablet 3   No current facility-administered medications for this visit.     Objective:  BP 128/86   Pulse 82   Temp 97.7 F (36.5 C) (Temporal)   Ht 6' (1.829 m)   Wt 283 lb 9.6 oz (128.6 kg)   SpO2 98%   BMI 38.46 kg/m  Gen: NAD, resting comfortably CV: RRR no murmurs rubs or gallops Lungs: CTAB no crackles, wheeze, rhonchi Abdomen: soft/nontender/nondistended/normal bowel sounds. No rebound or guarding.  Ext: trace to 1+ edema Skin: warm, dry     Assessment and Plan   #social update-engagement going well  #MMR status- unknown- we will check titers  #dehydration- did have issues and had Emergency Department visit in the fall. Has been using liquid IV but I advised against-  reports some swelling on this and with CHF want to be cautious.   # generalized anxiety disorder  S: Medication:Lexapro  5 mg but had stopped. No suicidal ideation  -also trazodone  availabele if needed for sleep -Started after losing his wife in October 2023.  Has not been able to connect with a therapist    05/19/2024   11:09 AM 11/18/2023   12:11 PM 04/15/2023    8:42 AM 10/01/2022    4:26 PM  GAD 7 : Generalized Anxiety Score  Nervous, Anxious, on Edge 1 0  0  1   Control/stop worrying 1 0  0  2   Worry too much - different things 1 0  1  2   Trouble relaxing 1 0  0  1   Restless 0 0  0  0   Easily annoyed or irritable 0 0  0  0   Afraid - awful might happen 0 0  0  1   Total GAD 7 Score 4 0 1 7  Anxiety Difficulty Somewhat difficult Not difficult at all Not difficult at all Somewhat difficult     Data saved with a previous flowsheet row definition  A/P: generalized anxiety disorder well controlled- he's doing well overall- continue to monitor. Though situation with family still bothersome.    # Diabetes with my care-managed by endocrinology Dr. Trixie (prior Dr. Kassie) -History of diabetic retinopathy  -History of diabetic polyneuropathy which contributed to diabetic wound/ulcer/osteomyelitis S: Medication:Lantus  and NovoLog - they are hoping to use patch as he has hard time with injections at times. Working on appeal- his biggest issue is with travel that he cannot get access to the shots regularly with the travel -jardiance  caused nausea and vomiting per notes-unfortunately cannot tolerate considering he has microalbuminuria- stable last viist Lab Results  Component Value Date   HGBA1C 8.5 (A) 04/19/2024   HGBA1C 8.4 (A) 08/26/2023   HGBA1C 8.9 (H) 04/15/2023  A/P: Diabetes-hoping he patch will get approved to help him with insulin  administration- he feels that's biggest barrier right now Microalbuminuria-on losartan , side effects with jardiance , offered Kerendia but with  dehydration issues in fall he opts out plus hyperkalemia risks  #CAD- will see Dr. Kate #Peripheral arterial disease- history of angioplasty in February 2022.  Statin myalgias #hyperlipidemia-with LDL goal under 70 particular with aortic atherosclerosis S: Medication:Aspirin  81 mg, Plavix  75 mg (interventional radiology has recommended continuing aspirin  and Plavix ), Imdur  30 mg -Zetia  10 mg ONLY right now - Livalo  1 mg not covered- interested in zypitimag -Repatha  coverage issues -no chest pain or shortness of breath  Lab Results  Component Value Date   CHOL 162 10/14/2023   HDL 50 10/14/2023   LDLCALC 90 10/14/2023   LDLDIRECT 173.0 07/01/2018   TRIG 123 10/14/2023   CHOLHDL 3.2 10/14/2023   A/P: coronary artery disease asymptomatic - continue current medications  Lipids suspect poor control only on Zetia . Repatha  too costly. Livalo  very expensive as well- offered Zypitimag trial through [facility]- he opts in- likely recheck lipids or LDL at least next visit in 4-6 months for CPE   #hypertension S: medication: Losartan  50 mg twice daily, imdur  30mg , torsemide  20 mg-cardiology has recommended every other day. Off metoprolol  BP Readings from Last 3 Encounters:  05/19/24 128/86  04/19/24 130/70  11/18/23 138/70  A/P: well controlled continue current medications   #Iliac artery aneurysm- on repeat ultrasound 2021 noted to be ectatic only-consider 3 to 5-year repeat- would be due for 5 year follow up at next visit- discuss then  # Morbid obesity-BMI over 35 with diabetes and hypertension-would recommended healthy eating/show exercise- also to help with diabetes   Recommended follow up: Return in about 4 months (around 09/16/2024) for physical or sooner if needed.Schedule b4 you leave. Future Appointments  Date Time Provider Department Center  08/16/2024  8:40 AM Trixie File, MD [id number] None    Lab/Order associations:   ICD-10-CM   1. Hyperlipidemia associated with  type 2  diabetes mellitus (HCC)  E11.69 CBC with Differential/Platelet   E78.5 Comprehensive metabolic panel with GFR    Lipid panel    2. Essential hypertension  I10 Lipid panel    3. Measles, mumps, rubella (MMR) vaccination status unknown  Z78.9 Measles/Mumps/Rubella Immunity    4. Obesity, morbid (HCC)  E66.01       Meds ordered this encounter  Medications   Pitavastatin  Magnesium  (ZYPITAMAG ) 2 MG TABS    Sig: Take 2 mg by mouth daily.    Dispense:  90 tablet    Refill:  3    Please call patient and talk about affordability and 30 vs 90 days    Return precautions advised.  Garnette Lukes, MD  "

## 2024-05-21 LAB — MEASLES/MUMPS/RUBELLA IMMUNITY
Mumps IgG: 93.4 [AU]/ml
Rubella: 9.09 {index}
Rubeola IgG: 86.9 [AU]/ml

## 2024-05-27 ENCOUNTER — Ambulatory Visit: Payer: Self-pay | Admitting: Family Medicine

## 2024-06-01 ENCOUNTER — Other Ambulatory Visit: Payer: Self-pay | Admitting: Family Medicine

## 2024-06-01 DIAGNOSIS — E875 Hyperkalemia: Secondary | ICD-10-CM

## 2024-08-16 ENCOUNTER — Ambulatory Visit: Admitting: Internal Medicine

## 2024-10-25 ENCOUNTER — Encounter: Admitting: Family Medicine
